# Patient Record
Sex: Female | Born: 1935
Health system: Southern US, Community
[De-identification: ages and names within clinical notes are randomized; demographics above are authoritative.]

## PROBLEM LIST (undated history)

## (undated) DIAGNOSIS — I700 Atherosclerosis of aorta: Secondary | ICD-10-CM

## (undated) DIAGNOSIS — D693 Immune thrombocytopenic purpura: Secondary | ICD-10-CM

## (undated) DIAGNOSIS — I739 Peripheral vascular disease, unspecified: Secondary | ICD-10-CM

## (undated) DIAGNOSIS — K760 Fatty (change of) liver, not elsewhere classified: Secondary | ICD-10-CM

## (undated) DIAGNOSIS — M949 Disorder of cartilage, unspecified: Secondary | ICD-10-CM

## (undated) DIAGNOSIS — K219 Gastro-esophageal reflux disease without esophagitis: Secondary | ICD-10-CM

## (undated) DIAGNOSIS — R7989 Other specified abnormal findings of blood chemistry: Secondary | ICD-10-CM

## (undated) DIAGNOSIS — E785 Hyperlipidemia, unspecified: Secondary | ICD-10-CM

## (undated) DIAGNOSIS — M75100 Unspecified rotator cuff tear or rupture of unspecified shoulder, not specified as traumatic: Secondary | ICD-10-CM

## (undated) DIAGNOSIS — K589 Irritable bowel syndrome without diarrhea: Secondary | ICD-10-CM

## (undated) DIAGNOSIS — I2510 Atherosclerotic heart disease of native coronary artery without angina pectoris: Secondary | ICD-10-CM

## (undated) DIAGNOSIS — IMO0002 Reserved for concepts with insufficient information to code with codable children: Secondary | ICD-10-CM

## (undated) DIAGNOSIS — K5790 Diverticulosis of intestine, part unspecified, without perforation or abscess without bleeding: Secondary | ICD-10-CM

## (undated) DIAGNOSIS — M722 Plantar fascial fibromatosis: Secondary | ICD-10-CM

## (undated) DIAGNOSIS — N83202 Unspecified ovarian cyst, left side: Secondary | ICD-10-CM

## (undated) DIAGNOSIS — M7660 Achilles tendinitis, unspecified leg: Secondary | ICD-10-CM

## (undated) DIAGNOSIS — Z8719 Personal history of other diseases of the digestive system: Secondary | ICD-10-CM

## (undated) DIAGNOSIS — R32 Unspecified urinary incontinence: Secondary | ICD-10-CM

## (undated) DIAGNOSIS — K56699 Other intestinal obstruction unspecified as to partial versus complete obstruction: Secondary | ICD-10-CM

## (undated) DIAGNOSIS — N318 Other neuromuscular dysfunction of bladder: Secondary | ICD-10-CM

## (undated) DIAGNOSIS — F419 Anxiety disorder, unspecified: Secondary | ICD-10-CM

## (undated) DIAGNOSIS — H9190 Unspecified hearing loss, unspecified ear: Secondary | ICD-10-CM

## (undated) DIAGNOSIS — K8020 Calculus of gallbladder without cholecystitis without obstruction: Secondary | ICD-10-CM

## (undated) DIAGNOSIS — M1710 Unilateral primary osteoarthritis, unspecified knee: Secondary | ICD-10-CM

## (undated) DIAGNOSIS — M899 Disorder of bone, unspecified: Secondary | ICD-10-CM

## (undated) DIAGNOSIS — R945 Abnormal results of liver function studies: Secondary | ICD-10-CM

## (undated) DIAGNOSIS — N8110 Cystocele, unspecified: Secondary | ICD-10-CM

## (undated) HISTORY — DX: Other specified abnormal findings of blood chemistry: R79.89

## (undated) HISTORY — DX: Plantar fascial fibromatosis: M72.2

## (undated) HISTORY — DX: Personal history of other diseases of the digestive system: Z87.19

## (undated) HISTORY — DX: Unspecified rotator cuff tear or rupture of unspecified shoulder, not specified as traumatic: M75.100

## (undated) HISTORY — DX: Unilateral primary osteoarthritis, unspecified knee: M17.10

## (undated) HISTORY — DX: Atherosclerosis of aorta: I70.0

## (undated) HISTORY — DX: Disorder of bone, unspecified: M94.9

## (undated) HISTORY — DX: Fatty (change of) liver, not elsewhere classified: K76.0

## (undated) HISTORY — DX: Abnormal results of liver function studies: R94.5

## (undated) HISTORY — DX: Atherosclerotic heart disease of native coronary artery without angina pectoris: I25.10

## (undated) HISTORY — DX: Unspecified ovarian cyst, left side: N83.202

## (undated) HISTORY — DX: Cystocele, unspecified: N81.10

## (undated) HISTORY — DX: Other neuromuscular dysfunction of bladder: N31.8

## (undated) HISTORY — DX: Gastro-esophageal reflux disease without esophagitis: K21.9

## (undated) HISTORY — DX: Peripheral vascular disease, unspecified: I73.9

## (undated) HISTORY — PX: KNEE ARTHROSCOPY: SUR90

## (undated) HISTORY — DX: Other intestinal obstruction unspecified as to partial versus complete obstruction: K56.699

## (undated) HISTORY — DX: Hyperlipidemia, unspecified: E78.5

## (undated) HISTORY — DX: Diverticulosis of intestine, part unspecified, without perforation or abscess without bleeding: K57.90

## (undated) HISTORY — DX: Reserved for concepts with insufficient information to code with codable children: IMO0002

## (undated) HISTORY — DX: Immune thrombocytopenic purpura: D69.3

## (undated) HISTORY — DX: Unspecified urinary incontinence: R32

## (undated) HISTORY — DX: Achilles tendinitis, unspecified leg: M76.60

## (undated) HISTORY — DX: Disorder of bone, unspecified: M89.9

## (undated) HISTORY — PX: ANGIOPLASTY: SHX39

## (undated) HISTORY — DX: Irritable bowel syndrome, unspecified: K58.9

## (undated) HISTORY — DX: Calculus of gallbladder without cholecystitis without obstruction: K80.20

## (undated) HISTORY — PX: CARDIAC CATHETERIZATION: SHX172

---
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
## 1960-05-07 HISTORY — PX: VARICOSE VEIN SURGERY: SHX832

## 1961-05-07 HISTORY — PX: ABDOMINAL HYSTERECTOMY: SHX81

## 2004-05-07 DIAGNOSIS — K56699 Other intestinal obstruction unspecified as to partial versus complete obstruction: Secondary | ICD-10-CM

## 2004-05-07 HISTORY — DX: Other intestinal obstruction unspecified as to partial versus complete obstruction: K56.699

## 2005-01-16 ENCOUNTER — Encounter: Payer: Self-pay | Admitting: Internal Medicine

## 2005-01-16 LAB — HM COLONOSCOPY

## 2005-05-07 ENCOUNTER — Encounter: Payer: Self-pay | Admitting: Internal Medicine

## 2005-10-05 HISTORY — PX: LAPAROSCOPIC SIGMOID COLECTOMY: SHX5928

## 2005-11-08 ENCOUNTER — Encounter: Payer: Self-pay | Admitting: Internal Medicine

## 2008-03-23 ENCOUNTER — Encounter: Payer: Self-pay | Admitting: Internal Medicine

## 2008-08-03 ENCOUNTER — Encounter: Payer: Self-pay | Admitting: Internal Medicine

## 2008-08-03 LAB — CONVERTED CEMR LAB
Cholesterol: 249 mg/dL
HDL: 66 mg/dL
LDL Cholesterol: 146 mg/dL
Triglyceride fasting, serum: 184 mg/dL

## 2009-02-16 ENCOUNTER — Encounter: Payer: Self-pay | Admitting: Internal Medicine

## 2009-02-16 LAB — CONVERTED CEMR LAB
BUN: 20 mg/dL
Calcium: 9.1 mg/dL
Chloride: 103 meq/L
Cholesterol: 247 mg/dL
Creatinine, Ser: 0.9 mg/dL
Glucose, Bld: 89 mg/dL
HDL: 65 mg/dL
LDL Cholesterol: 152 mg/dL
Potassium: 4.4 meq/L
Sodium: 140 meq/L
Total Protein: 6.9 g/dL
Triglyceride fasting, serum: 151 mg/dL

## 2009-08-05 ENCOUNTER — Encounter: Payer: Self-pay | Admitting: Internal Medicine

## 2009-08-05 LAB — CONVERTED CEMR LAB
Albumin: 3.7 g/dL
CO2: 32 meq/L
Calcium: 9.1 mg/dL
Glucose, Bld: 72 mg/dL
Potassium: 4.3 meq/L
Sodium: 140 meq/L

## 2009-08-12 ENCOUNTER — Encounter: Payer: Self-pay | Admitting: Internal Medicine

## 2009-09-18 ENCOUNTER — Encounter: Payer: Self-pay | Admitting: Internal Medicine

## 2009-10-05 HISTORY — PX: OTHER SURGICAL HISTORY: SHX169

## 2009-10-17 ENCOUNTER — Encounter: Payer: Self-pay | Admitting: Internal Medicine

## 2009-10-20 ENCOUNTER — Encounter: Payer: Self-pay | Admitting: Internal Medicine

## 2009-10-20 LAB — CONVERTED CEMR LAB
HCT: 39.4 %
HCT: 39.9 %
Hemoglobin: 12.9 g/dL
Lymphocytes, automated: 33.7 %
MCV: 89.5 fL
Monocytes Relative: 1.6 %
Neutrophils Relative %: 49.8 %
Neutrophils Relative %: 61.1 %
Platelets: 128 10*3/uL
Platelets: 165 10*3/uL
RBC: 4.13 M/uL

## 2009-11-03 ENCOUNTER — Encounter: Payer: Self-pay | Admitting: Internal Medicine

## 2009-11-15 ENCOUNTER — Encounter: Payer: Self-pay | Admitting: Internal Medicine

## 2009-11-15 LAB — CONVERTED CEMR LAB
HCT: 37 %
Hemoglobin: 12.4 g/dL
MCV: 89.7 fL
Monocytes Relative: 1.9 %
Platelets: 111 10*3/uL
WBC: 17.3 10*3/uL

## 2009-12-20 ENCOUNTER — Encounter: Payer: Self-pay | Admitting: Internal Medicine

## 2009-12-20 LAB — CONVERTED CEMR LAB
ALT: 30 units/L
AST: 19 units/L
Lymphocytes, automated: 30.8 %
MCV: 89.3 fL
Monocytes Relative: 1.1 %
Neutrophils Relative %: 59.1 %

## 2009-12-21 ENCOUNTER — Encounter: Payer: Self-pay | Admitting: Internal Medicine

## 2010-02-16 LAB — HM MAMMOGRAPHY

## 2010-02-20 ENCOUNTER — Encounter: Payer: Self-pay | Admitting: Internal Medicine

## 2010-02-20 LAB — CONVERTED CEMR LAB
AST: 21 units/L
Albumin: 3.7 g/dL
Alkaline Phosphatase: 97 units/L
CO2: 31 meq/L
Chloride: 103 meq/L
Cholesterol: 229 mg/dL
HDL: 56 mg/dL
Hemoglobin: 13.3 g/dL
LDL Cholesterol: 127 mg/dL
MCV: 87 fL
Monocytes Relative: 0.9 %
Platelets: 90 10*3/uL
Potassium: 3.8 meq/L
RBC: 4.63 M/uL
Sodium: 140 meq/L
TSH: 2.52 microintl units/mL
Triglyceride fasting, serum: 228 mg/dL

## 2010-02-22 ENCOUNTER — Encounter: Payer: Self-pay | Admitting: Internal Medicine

## 2010-06-27 ENCOUNTER — Encounter: Payer: Self-pay | Admitting: Internal Medicine

## 2010-06-27 ENCOUNTER — Ambulatory Visit (INDEPENDENT_AMBULATORY_CARE_PROVIDER_SITE_OTHER): Payer: Medicare HMO | Admitting: Internal Medicine

## 2010-06-27 ENCOUNTER — Other Ambulatory Visit: Payer: Self-pay | Admitting: Internal Medicine

## 2010-06-27 ENCOUNTER — Other Ambulatory Visit: Payer: Medicare Other

## 2010-06-27 DIAGNOSIS — D693 Immune thrombocytopenic purpura: Secondary | ICD-10-CM | POA: Insufficient documentation

## 2010-06-27 DIAGNOSIS — K219 Gastro-esophageal reflux disease without esophagitis: Secondary | ICD-10-CM | POA: Insufficient documentation

## 2010-06-27 DIAGNOSIS — M949 Disorder of cartilage, unspecified: Secondary | ICD-10-CM

## 2010-06-27 DIAGNOSIS — M1710 Unilateral primary osteoarthritis, unspecified knee: Secondary | ICD-10-CM | POA: Insufficient documentation

## 2010-06-27 DIAGNOSIS — R32 Unspecified urinary incontinence: Secondary | ICD-10-CM | POA: Insufficient documentation

## 2010-06-27 DIAGNOSIS — E785 Hyperlipidemia, unspecified: Secondary | ICD-10-CM

## 2010-06-27 DIAGNOSIS — I739 Peripheral vascular disease, unspecified: Secondary | ICD-10-CM

## 2010-06-27 DIAGNOSIS — M899 Disorder of bone, unspecified: Secondary | ICD-10-CM | POA: Insufficient documentation

## 2010-06-27 DIAGNOSIS — N318 Other neuromuscular dysfunction of bladder: Secondary | ICD-10-CM

## 2010-06-27 DIAGNOSIS — Z8719 Personal history of other diseases of the digestive system: Secondary | ICD-10-CM | POA: Insufficient documentation

## 2010-06-27 DIAGNOSIS — M129 Arthropathy, unspecified: Secondary | ICD-10-CM | POA: Insufficient documentation

## 2010-06-27 DIAGNOSIS — IMO0002 Reserved for concepts with insufficient information to code with codable children: Secondary | ICD-10-CM | POA: Insufficient documentation

## 2010-06-27 DIAGNOSIS — K589 Irritable bowel syndrome without diarrhea: Secondary | ICD-10-CM | POA: Insufficient documentation

## 2010-06-27 DIAGNOSIS — H409 Unspecified glaucoma: Secondary | ICD-10-CM | POA: Insufficient documentation

## 2010-06-27 LAB — CBC WITH DIFFERENTIAL/PLATELET
Basophils Relative: 0.5 % (ref 0.0–3.0)
Eosinophils Absolute: 0.3 10*3/uL (ref 0.0–0.7)
Eosinophils Relative: 1.7 % (ref 0.0–5.0)
Hemoglobin: 13.6 g/dL (ref 12.0–15.0)
Lymphocytes Relative: 28 % (ref 12.0–46.0)
MCHC: 33.7 g/dL (ref 30.0–36.0)
MCV: 86.9 fl (ref 78.0–100.0)
Neutro Abs: 9.2 10*3/uL — ABNORMAL HIGH (ref 1.4–7.7)
RBC: 4.64 Mil/uL (ref 3.87–5.11)
WBC: 15.4 10*3/uL — ABNORMAL HIGH (ref 4.5–10.5)

## 2010-07-04 NOTE — Letter (Signed)
Summary: [facility]  [facility]   Imported By: Lester Kevil 06/30/2010 08:15:56  _____________________________________________________________________  External Attachment:    Type:   Image     Comment:   External Document

## 2010-07-04 NOTE — Letter (Signed)
Summary: Bariatric Div/William Sibyl Parr MD  Bariatric Div/William Sibyl Parr MD   Imported By: Lester  06/30/2010 08:29:18  _____________________________________________________________________  External Attachment:    Type:   Image     Comment:   External Document

## 2010-07-04 NOTE — Assessment & Plan Note (Signed)
Summary: new pt/medicare/lb   Vital Signs:  Patient profile:   77 year old female Height:      63 inches (160.02 cm) Weight:      145.0 pounds (65.91 kg) BMI:     25.78 O2 Sat:      95 % on Room air Temp:     97.6 degrees F (36.44 degrees C) oral Pulse rate:   73 / minute BP sitting:   130 / 72  (left arm) Cuff size:   regular  Vitals Entered By: Orlan Leavens RMA (June 27, 2010 10:56 AM)  O2 Flow:  Room air CC: New patient Is Patient Diabetic? No Pain Assessment Patient in pain? no        Primary Care Provider:  Newt Lukes MD  CC:  New patient.  History of Present Illness: new pt to me and our practice, here to est care - prev in [place], [place] prior records brought with pt today to review  chronic ITP - dx 1950s s/p splenectomy- on low dose pred to control same - usual count 80-100K, occ flares with illness - remote hosp to tx acute exac, no severe flares in recent years on current tx - no brusing or bleeding symptoms   dyslipidemia - rx simva fall 2011 poorly tol due to myalgias - declines other stain trial tx - follows low fat diet and exercises for weight control (walking)  GERD with HH - chronic hx, uses otc med as needed for same - ?recs for any med - no abd pain, bowel change or n/v/anorexia symptoms   OA - R knee, R>L hip and low back ddd - occ steroid injections with ortho for acute flares - last >6 mo ago in hip - no swelling or need for daily pain med relief - uses glucosamine  c/o overactive bladder and freq "urge" - ?if any med tx available (has glaucoma)  Preventive Screening-Counseling & Management  Alcohol-Tobacco     Alcohol drinks/day: <1     Alcohol Counseling: not indicated; use of alcohol is not excessive or problematic     Smoking Status: never     Tobacco Counseling: not indicated; no tobacco use  Caffeine-Diet-Exercise     Diet Counseling: not indicated; diet is assessed to be healthy     Does Patient Exercise: yes     Exercise  Counseling: not indicated; exercise is adequate     Depression Counseling: not indicated; screening negative for depression  Safety-Violence-Falls     Seat Belt Counseling: not indicated; patient wears seat belts     Helmet Counseling: not applicable     Firearm Counseling: not indicated; uses recommended firearm safety measures     Violence Counseling: not indicated; no violence risk noted     Fall Risk Counseling: not indicated; no significant falls noted  Clinical Review Panels:  Prevention   Last Mammogram:  Done @ [place] radiologist in [place] No specific mammographic evidence of malignancy.   (02/16/2010)   Last Pap Smear:  Interpretation Result:Negative for intraepithelial Lesion or Malignancy.    (05/07/2005)  Immunizations   Last Zoster Vaccine:  Zostavax (04/07/2007)  Lipid Management   Cholesterol:  229 (02/20/2010)   LDL (bad choesterol):  127 (02/20/2010)   HDL (good cholesterol):  56 (02/20/2010)   Triglycerides:  228 (02/20/2010)  CBC   WBC:  9.2 (02/20/2010)   RBC:  4.63 (02/20/2010)   Hgb:  13.3 (02/20/2010)   Hct:  40.3 (02/20/2010)   Platelets:  90 (02/20/2010)   MCV  87.0 (02/20/2010)   PMN:  46.7 (02/20/2010)   Monos:  0.9 (02/20/2010)  Complete Metabolic Panel   Glucose:  83 (02/20/2010)   Sodium:  140 (02/20/2010)   Potassium:  3.8 (02/20/2010)   Chloride:  103 (02/20/2010)   CO2:  31 (02/20/2010)   BUN:  15 (02/20/2010)   Creatinine:  0.8 (02/20/2010)   Albumin:  3.7 (02/20/2010)   Total Protein:  7.0 (02/20/2010)   Calcium:  8.7 (02/20/2010)   Total Bili:  0.4 (02/20/2010)   Alk Phos:  97 (02/20/2010)   SGPT (ALT):  31 (02/20/2010)   SGOT (AST):  21 (02/20/2010)   -  Date:  02/20/2010    WBC: 9.2    HGB: 13.3    HCT: 40.3    RBC: 4.63    PLT: 90    MCV: 87.0    Neutrophil: 46.7    Lymphs: 43.1    Monos: 0.9    BG Random: 83    BUN: 15    Creatinine: 0.8    Sodium: 140    Potassium: 3.8    Chloride: 103    CO2  Total: 31    SGOT (AST): 21    SGPT (ALT): 31    T. Bilirubin: 0.4    Alk Phos: 97    Calcium: 8.7    Total Protein: 7.0    Albumin: 3.7    Cholesterol: 229    LDL: 127    HDL: 56    Triglycerides: 161    TSH: 2.52  Date:  12/20/2009    WBC: 11.3    HGB: 13.0    HCT: 37.9    RBC: 4.24    PLT: 102    MCV: 89.3    Neutrophil: 59.1    Lymphs: 30.8    Monos: 1.1    SGOT (AST): 19    SGPT (ALT): 30    Cholesterol: 148    LDL: 53    HDL: 60    Triglycerides: 096    TSH: 1.81  Date:  11/15/2009    WBC: 17.3    HGB: 12.4    HCT: 37.0    RBC: 4.13    PLT: 111    MCV: 89.7    Neutrophil: 63.2    Lymphs: 25.9    Monos: 1.9  Date:  10/20/2009    WBC: 10.9    WBC: 19.1    HGB: 12.9    HGB: 13.6    HCT: 39.4    HCT: 39.9    RBC: 4.40    RBC: 4.13    PLT: 128    PLT: 165    MCV: 89.5    MCV: 88.9    Neutrophil: 49.8    Neutrophil: 61.1    Lymphs: 39.6    Lymphs: 33.7    Monos: 1.6    Monos: 1.0  Date:  08/05/2009    BG Random: 72    BUN: 21    Creatinine: 0.8    Sodium: 140    Potassium: 4.3    Chloride: 104    CO2 Total: 32    Calcium: 9.1    Albumin: 3.7    Cholesterol: 230    LDL: 124    HDL: 62    Triglycerides: 045    TSH: 1.98  Date:  02/16/2009    BG Random: 89    BUN: 20    Creatinine: 0.9    Sodium: 140  Potassium: 4.4    Chloride: 103    CO2 Total: 31    SGOT (AST): 19    SGPT (ALT): 27    T. Bilirubin: 0.4    Alk Phos: 89    Calcium: 9.1    Total Protein: 6.9    Cholesterol: 247    LDL: 152    HDL: 65    Triglycerides: 045    TSH: 2.88  Date:  08/03/2008    Cholesterol: 249    LDL: 146    HDL: 66    Triglycerides: 409  Current Medications (verified): 1)  Prednisone 2.5 Mg Tabs (Prednisone) .... Take 1 By Mouth Once Daily 2)  Premarin 0.3 Mg Tabs (Estrogens Conjugated) .... Take 1 By Mouth Once Daily 3)  Amoxicillin 500 Mg Caps (Amoxicillin) .... Use For Dental Procedures 4)  Lumigan 0.01 % Soln (Bimatoprost)  .... Use 1 Drop Each Eye Once Daily 5)  Citracal Calcium+d 600-40-500 Mg-Mg-Unit Xr24h-Tab (Calcium-Magnesium-Vitamin D) .... Take 2 By Mouth Once Daily 6)  Glucosamine 500 Mg Caps (Glucosamine Sulfate) .... Take 1 By Mouth Once Daily 7)  Multivitamins  Tabs (Multiple Vitamin) .... Take 1 By Mouth Once Daily 8)  Magnesium 500 Mg Tabs (Magnesium) .... Take 1 By Mouth Once Daily 9)  Vitamin C 500 Mg Tabs (Ascorbic Acid) .... Take 1 By Mouth Once Daily 10)  Stool Softener 100 Mg Caps (Docusate Sodium) .... Take As Needed 11)  Flaxseed Oil 1000 Mg Caps (Flaxseed (Linseed)) .... Take 1 By Mouth Once Daily 12)  Appearex 2.5 Mg Tabs (Biotin) .... Take 1 By Mouth Once Daily  Allergies (verified): No Known Drug Allergies  Past History:  Past Medical History: ITP, chronic - 80-100 baseline- pred maint, s/p splenectomy Diverticulitis, hx GERD with HH IBS PVD - L pop PTA 10/2009 dyslipidemia - declines statin due to myalgias (prev simva) Urinary incontinence glaucoma/cataracts  MD roster: optho - [facility] ([facility])  Past Surgical History: Splenectomy (8119) Hysterectomy (1963) L pop PTA 10/2009 Colectomy, sigmoid (10/2005)  Family History: Family History of Arthritis (parent) mini stroke (parent)  dad exired age 62 - stomach ca mom expired age 64 - TIA, MI/CAD  Family History Hypertension (daughter) Family History High cholesterol (daughter)  Social History: Never Smoked rarae alcohol use retired - Futures trader married, lives with spouse -  moved to [place] from [place], [place] 05/2010 to be close to 2 kids  Smoking Status:  never Does Patient Exercise:  yes  Review of Systems       see HPI above. I have reviewed all other systems and they were negative.   Physical Exam  General:  spry, alert, well-developed, well-nourished, and cooperative to examination.    Head:  Normocephalic and atraumatic without obvious abnormalities. No apparent alopecia or balding. Eyes:   vision grossly intact; pupils equal, round and reactive to light.  conjunctiva and lids normal.    Ears:  discrete hearing aides B, R ear normal and no external deformities.   Mouth:  teeth and gums in good repair; mucous membranes moist, without lesions or ulcers. oropharynx clear without exudate, no erythema.  Neck:  supple, full ROM, no masses, no thyromegaly; no thyroid nodules or tenderness. no JVD or carotid bruits.   Lungs:  normal respiratory effort, no intercostal retractions or use of accessory muscles; normal breath sounds bilaterally - no crackles and no wheezes.    Heart:  normal rate, regular rhythm, no murmur, and no rub. BLE without edema. Abdomen:  soft,  non-tender, normal bowel sounds, no distention; no masses and no appreciable hepatomegaly or splenomegaly.   Genitalia:  defer Msk:  No deformity or scoliosis noted of thoracic or lumbar spine.   Neurologic:  alert & oriented X3 and cranial nerves II-XII symetrically intact.  strength normal in all extremities, sensation intact to light touch, and gait normal. speech fluent without dysarthria or aphasia; follows commands with good comprehension.  Skin:  no rashes, vesicles, ulcers, or erythema. No nodules or irregularity to palpation.  Psych:  Oriented X3, memory intact for recent and remote, normally interactive, good eye contact, not anxious appearing, not depressed appearing, and not agitated.      Impression & Recommendations:  Problem # 1:  IMMUNE THROMBOCYTOPENIC PURPURA (ICD-287.31) chronic - dx 1950s = s/p splenectomy remote for same and maintained on chronic low dose pred no recent flares - baseline count 80-100 on pred tx - does not follow with heme routinely - cont same and check labs q74mo and as needed  Orders: TLB-CBC Platelet - w/Differential (85025-CBCD)  Problem # 2:  DYSLIPIDEMIA (ICD-272.4) intol of statins (simva trail fall 2011) and declines repeat trial of alt statin risk with known PAD reviewed - pt  understands and declines statin tx -  cont fish oil and diet/exercise/weight control  Problem # 3:  OVERACTIVE BLADDER (ICD-596.51) ?med trial - hold on same given dx glaucoma -  will consider if other med tx available vs uro f/u for other testing and tx  Problem # 4:  GERD (ICD-530.81) rec two times a day max dose med (pt purchases same otc) chronic hx same with IBS-C send for prior records  Her updated medication list for this problem includes:    Zantac 150 Mg Tabs (Ranitidine hcl) .[name]... 1 by mouth two times a day  Problem # 5:  UNSPECIFIED PERIPHERAL VASCULAR DISEASE (ICD-443.9) s/p PTA to L pop  - f/u ABI good and no current claudication - cont expect obs as intol of other med tx for same Time spent with patient 45 minutes, more than 50% of this time was spent counseling patient on ITP and OAB and problems concerning the need for med trial and est local care - name given for ortho (can arrange referral as needed), advised on otc meds and ?need uro eval once optho issues stable - also review available records (copy into emr) and send for remianing records  Complete Medication List: 1)  Prednisone 2.5 Mg Tabs (Prednisone) .... Take 1 by mouth once daily 2)  Premarin 0.3 Mg Tabs (Estrogens conjugated) .... Take 1 by mouth once daily 3)  Amoxicillin 500 Mg Caps (Amoxicillin) .... Use for dental procedures 4)  Lumigan 0.01 % Soln (Bimatoprost) .... Use 1 drop each eye once daily 5)  Citracal Calcium+d 600-40-500 Mg-mg-unit Xr24h-tab (Calcium-magnesium-vitamin d) .... Take 2 by mouth once daily 6)  Glucosamine 500 Mg Caps (Glucosamine sulfate) .... Take 1 by mouth once daily 7)  Multivitamins Tabs (Multiple vitamin) .... Take 1 by mouth once daily 8)  Magnesium 500 Mg Tabs (Magnesium) .... Take 1 by mouth once daily 9)  Vitamin C 500 Mg Tabs (Ascorbic acid) .... Take 1 by mouth once daily 10)  Stool Softener 100 Mg Caps (Docusate sodium) .... Take as needed 11)  Flaxseed Oil 1000 Mg  Caps (Flaxseed (linseed)) .... Take 1 by mouth once daily 12)  Appearex 2.5 Mg Tabs (Biotin) .... Take 1 by mouth once daily 13)  Zantac 150 Mg Tabs (Ranitidine hcl) .[name].. 1 by  mouth two times a day  Patient Instructions: 1)  it was good to see you today. 2)  medications and history reviewed today 3)  will send for additional records from your prior provider as discussed - release of info signed today 4)  use zantac 150mg  two times a day for reflux symptoms  5)  will consider if any med tx available for bladder despite glaucoma diagnosis 6)  test(s) ordered today - your results will be called to you after review in 48-72 hours from the time of test completion; if any changes need to be made or there are abnormal results, you will be contacted directly.  7)  call dr. Charlett Blake at [facility] for orthopedic evaluation as needed - we can make referral if needed 8)  Please schedule a follow-up appointment in 4 months to monitor platelets, call sooner if problems.  Prescriptions: PREMARIN 0.3 MG TABS (ESTROGENS CONJUGATED) take 1 by mouth once daily  #90 x 1   Entered by:   Brenton Grills CMA (AAMA)   Authorized by:   Newt Lukes MD   Signed by:   Brenton Grills CMA (AAMA) on 06/27/2010   Method used:   Print then Give to Patient   RxID:   [id number] PREDNISONE 2.5 MG TABS (PREDNISONE) take 1 by mouth once daily  #90 x 1   Entered by:   Brenton Grills CMA (AAMA)   Authorized by:   Newt Lukes MD   Signed by:   Brenton Grills CMA (AAMA) on 06/27/2010   Method used:   Print then Give to Patient   RxID:   [id number]    Orders Added: 1)  TLB-CBC Platelet - w/Differential [85025-CBCD] 2)  New Patient Level IV [95284]   Immunization History:  Zostavax History:    Zostavax # 1:  zostavax (04/07/2007)   Immunization History:  Zostavax History:    Zostavax # 1:  Zostavax (04/07/2007)   Mammogram  Procedure date:  05/07/2009  Findings:      No specific  mammographic evidence of malignancy.    Pap Smear  Procedure date:  05/07/2005  Findings:      Interpretation Result:Negative for intraepithelial Lesion or Malignancy.     Mammogram  Procedure date:  02/16/2010  Findings:      Done @ [place] radiologist in [place] No specific mammographic evidence of malignancy.

## 2010-07-04 NOTE — Letter (Signed)
Summary: Pennelope Bracken MD  Pennelope Bracken MD   Imported By: Lester [place] 06/30/2010 08:23:43  _____________________________________________________________________  External Attachment:    Type:   Image     Comment:   External Document

## 2010-07-04 NOTE — Op Note (Signed)
Summary: Claudication/William Frederico Hamman MD  Claudication/William Frederico Hamman MD   Imported By: Lester [place] 06/30/2010 08:19:57  _____________________________________________________________________  External Attachment:    Type:   Image     Comment:   External Document

## 2010-07-18 NOTE — Letter (Signed)
Summary: [facility]  [facility]   Imported By: Sherian Rein 07/13/2010 09:23:24  _____________________________________________________________________  External Attachment:    Type:   Image     Comment:   External Document

## 2010-07-18 NOTE — Letter (Signed)
Summary: [facility] Internal Medicine  [facility] Internal Medicine   Imported By: Sherian Rein 07/13/2010 09:25:04  _____________________________________________________________________  External Attachment:    Type:   Image     Comment:   External Document

## 2010-07-18 NOTE — Procedures (Signed)
Summary: Colonoscopy/[facility]  Colonoscopy/[facility]   Imported By: Sherian Rein 07/13/2010 09:17:29  _____________________________________________________________________  External Attachment:    Type:   Image     Comment:   External Document

## 2010-09-23 ENCOUNTER — Encounter: Payer: Self-pay | Admitting: Cardiology

## 2010-10-06 HISTORY — PX: CATARACT EXTRACTION, BILATERAL: SHX1313

## 2010-10-26 ENCOUNTER — Other Ambulatory Visit (INDEPENDENT_AMBULATORY_CARE_PROVIDER_SITE_OTHER): Payer: Medicare HMO

## 2010-10-26 ENCOUNTER — Ambulatory Visit (INDEPENDENT_AMBULATORY_CARE_PROVIDER_SITE_OTHER): Payer: Medicare HMO | Admitting: Internal Medicine

## 2010-10-26 ENCOUNTER — Encounter: Payer: Self-pay | Admitting: Internal Medicine

## 2010-10-26 VITALS — BP 130/80 | HR 76 | Temp 97.6°F | Ht 63.0 in | Wt 142.6 lb

## 2010-10-26 DIAGNOSIS — M7070 Other bursitis of hip, unspecified hip: Secondary | ICD-10-CM | POA: Insufficient documentation

## 2010-10-26 DIAGNOSIS — D693 Immune thrombocytopenic purpura: Secondary | ICD-10-CM

## 2010-10-26 DIAGNOSIS — E785 Hyperlipidemia, unspecified: Secondary | ICD-10-CM

## 2010-10-26 DIAGNOSIS — I739 Peripheral vascular disease, unspecified: Secondary | ICD-10-CM

## 2010-10-26 DIAGNOSIS — M76899 Other specified enthesopathies of unspecified lower limb, excluding foot: Secondary | ICD-10-CM

## 2010-10-26 LAB — CBC WITH DIFFERENTIAL/PLATELET
Basophils Absolute: 0.1 10*3/uL (ref 0.0–0.1)
Eosinophils Absolute: 0.2 10*3/uL (ref 0.0–0.7)
HCT: 41.6 % (ref 36.0–46.0)
Hemoglobin: 14.1 g/dL (ref 12.0–15.0)
Lymphs Abs: 4.6 10*3/uL — ABNORMAL HIGH (ref 0.7–4.0)
MCHC: 34 g/dL (ref 30.0–36.0)
MCV: 86.9 fl (ref 78.0–100.0)
Neutro Abs: 5.6 10*3/uL (ref 1.4–7.7)
RDW: 13.5 % (ref 11.5–14.6)

## 2010-10-26 MED ORDER — MELATONIN 1 MG PO TABS: 1.0000 mg | ORAL_TABLET | Freq: Every evening | ORAL | Status: DC | PRN

## 2010-10-26 NOTE — Assessment & Plan Note (Signed)
Hx same - relief following steroid injections prior to move to [place] Pt requests est with ortho for same - referral done

## 2010-10-26 NOTE — Assessment & Plan Note (Signed)
S/p splenectomy in 1950s - usually 80-100k range Maintained on chronic low dose pred for same - Recheck CBC now to monitor

## 2010-10-26 NOTE — Assessment & Plan Note (Signed)
Declines statin due to myalgias -  prev on simva following PVD intervention 2011 (L pop PTA)

## 2010-10-26 NOTE — Patient Instructions (Addendum)
It was good to see you today. We have reviewed your prior records including labs and tests today Ok to try melatonin at bedtime we'll make referral to vascular specialist and to orthopedics. Our office will contact you regarding appointment(s) once made. Please schedule followup in Nov for your physical, call sooner if problems.

## 2010-10-26 NOTE — Assessment & Plan Note (Signed)
L pop PTA/stent 10/2009 in [place], [facility] mgmt - no vasc symptoms but will refer to vasc for follow up as requested

## 2010-10-26 NOTE — Progress Notes (Signed)
Subjective:    Patient ID: Tamara Robertson, female    DOB: 17-Mar-1930, 77 y.o.   MRN: [id number]  HPI Here for follow up - reviewed chronic medical issues:  chronic ITP - dx 1950s s/p splenectomy- on low dose pred to control same - usual count 80-100K, occ flares with illness - remote hosp to tx acute exac, no severe flares in recent years on current tx - no brusing or bleeding symptoms   dyslipidemia - rx simva fall 2011 poorly tol due to myalgias - declines other statin trial tx - follows low fat diet and exercises for weight control (walking)   GERD with HH - chronic hx, uses otc med as needed for same - no abd pain, bowel change or n/v/anorexia symptoms   OA - R knee, R>L hip and low back ddd - occ steroid injections for acute flares - no swelling or need for daily pain med relief - uses glucosamine - prev followed with ortho but not re-estab since move to [place]  PVD s/p stent L - no claudication or leg pain but ?need vasc follow up locally    Past Medical History  Diagnosis Date  . OSTEOARTHRITIS, KNEE, RIGHT   . URINARY INCONTINENCE   . Irritable bowel syndrome   . DIVERTICULITIS, HX OF   . OSTEOPENIA   . Immune thrombocytopenic purpura     chronic 80-100K  . UNSPECIFIED PERIPHERAL VASCULAR DISEASE   . OVERACTIVE BLADDER   . DYSLIPIDEMIA   . Glaucoma   . GERD      Review of Systems  Respiratory: Negative for shortness of breath.   Cardiovascular: Negative for leg swelling.  Musculoskeletal: Negative for back pain and gait problem.  Hematological: Does not bruise/bleed easily.       Objective:   Physical Exam BP 130/80  Pulse 76  Temp(Src) 97.6 F (36.4 C) (Oral)  Ht 5\' 3"  (1.6 m)  Wt 142 lb 9.6 oz (64.683 kg)  BMI 25.26 kg/m2  SpO2 96% Physical Exam  Constitutional: She is oriented to person, place, and time. She appears well-developed and well-nourished. No distress. Neck: Normal range of motion. Neck supple. No JVD present. No thyromegaly present.    Cardiovascular: Normal rate, regular rhythm and normal heart sounds.  No murmur heard. No BLE edema. Pulmonary/Chest: Effort normal and breath sounds normal. No respiratory distress. She has no wheezes.  Musculoskeletal: Tender to palpation over greater troch bursa bilaterally. No groin pain. Normal range of motion hips, knees. No gross deformities Neurological: She is alert and oriented to person, place, and time. No cranial nerve deficit. Coordination normal.  Skin: Skin is warm and dry. No rash noted. No erythema.  Psychiatric: She has a normal mood and affect. Her behavior is normal. Judgment and thought content normal.   Lab Results  Component Value Date   WBC 15.4* 06/27/2010   HGB 13.6 06/27/2010   HCT 40.3 06/27/2010   PLT 129.0* 06/27/2010   CHOL 229 02/20/2010   HDL 56 02/20/2010   ALT 31 02/20/2010   AST 21 02/20/2010   NA 140 02/20/2010   K 3.8 02/20/2010   CL 103 02/20/2010   CREATININE 0.8 02/20/2010   BUN 15 02/20/2010   CO2 31 02/20/2010   TSH 2.52 02/20/2010        Assessment & Plan:  See problem list. Medications and labs reviewed today.

## 2010-11-16 ENCOUNTER — Ambulatory Visit: Payer: Medicare Other | Admitting: Cardiology

## 2010-11-23 ENCOUNTER — Encounter: Payer: Self-pay | Admitting: Cardiology

## 2010-11-23 ENCOUNTER — Ambulatory Visit (INDEPENDENT_AMBULATORY_CARE_PROVIDER_SITE_OTHER): Payer: Medicare HMO | Admitting: Cardiology

## 2010-11-23 DIAGNOSIS — E785 Hyperlipidemia, unspecified: Secondary | ICD-10-CM

## 2010-11-23 DIAGNOSIS — I739 Peripheral vascular disease, unspecified: Secondary | ICD-10-CM

## 2010-11-23 MED ORDER — PRAVASTATIN SODIUM 20 MG PO TABS: 20.0000 mg | ORAL_TABLET | Freq: Every evening | ORAL | Status: DC

## 2010-11-23 NOTE — Assessment & Plan Note (Addendum)
She did not tolerate simvastatin in the past.  Her last LDL was 127.  I have convinced her to try pravastatin 20 mg daily.  I will follow this fall before she sees Dr. Felicity Coyer. I will take the liberty of adding routine labs that Dr. Felicity Coyer might want for her routine follow up at that same time so that the patient only has one lab draw.

## 2010-11-23 NOTE — Patient Instructions (Addendum)
You are being scheduled for a treadmill.  Please follow the instructions given.  You are being started on Pravastatin 20 mg once a day.  Continue all other medications as listed.  You will need fasting lab work in October (lipid, liver, bmp, cbc,TSH)

## 2010-11-23 NOTE — Assessment & Plan Note (Signed)
The patient needs aggressive risk reduction. I will bring the patient back for a POET (Plain Old Exercise Test). This will allow me to screen for obstructive coronary disease, risk stratify and very importantly provide a prescription for exercise.  This is indicated as she wants to exercise and has not been screened in several years.Tamara Robertson

## 2010-11-23 NOTE — Progress Notes (Signed)
HPI The patient presents as a new patient. She has a history of peripheral vascular disease. She reports no other cardiac history. She had a previous PTA of the left popliteal obstruction. She was having claudication which is relieved. She reports negative carotid Dopplers in the past. She reports a negative treadmill test will be about 10 years ago. She walks for exercise. She denies any chest pressure, neck or arm discomfort. She denies any palpitations, presyncope or syncope. She has no shortness of breath, PND or orthopnea.  Allergies  Allergen Reactions  . Aspirin     Current Outpatient Prescriptions  Medication Sig Dispense Refill  . amoxicillin (AMOXIL) 500 MG capsule Take 500 mg by mouth as needed. Use for dental procedures       . bimatoprost (LUMIGAN) 0.03 % ophthalmic solution Place 1 drop into both eyes daily.        . Biotin (APPEAREX) 2.5 MG TABS Take by mouth daily.        . Calcium-Magnesium-Vitamin D (CITRACAL CALCIUM+D) 600-40-500 MG-MG-UNIT TB24 Take by mouth daily.        [name] docusate sodium (COLACE) 100 MG capsule Take 100 mg by mouth as needed.        [name] estrogens, conjugated, (PREMARIN) 0.3 MG tablet Take 0.3 mg by mouth daily. Take daily for 21 days then do not take for 7 days.       . Flaxseed, Linseed, (FLAXSEED OIL) 1000 MG CAPS Take by mouth daily.        . Glucosamine 500 MG TABS Take by mouth daily.        . Magnesium 500 MG TABS Take by mouth daily.        . Multiple Vitamin (MULTIVITAMIN) tablet Take 1 tablet by mouth daily.        . predniSONE (DELTASONE) 2.5 MG tablet Take 2.5 mg by mouth every other day.       . ranitidine (ZANTAC) 150 MG tablet Take 150 mg by mouth 2 (two) times daily.        . vitamin C (ASCORBIC ACID) 500 MG tablet Take 500 mg by mouth daily.          Past Medical History  Diagnosis Date  . OSTEOARTHRITIS, KNEE, RIGHT   . URINARY INCONTINENCE   . Irritable bowel syndrome   . DIVERTICULITIS, HX OF   . OSTEOPENIA   . Immune  thrombocytopenic purpura     chronic 80-100K  . UNSPECIFIED PERIPHERAL VASCULAR DISEASE   . OVERACTIVE BLADDER   . DYSLIPIDEMIA   . Glaucoma   . GERD     Past Surgical History  Procedure Date  . Splenectomy 1954  . Abdominal hysterectomy 1963  . L pop pta 10/2009  . Colectomy, simoid 10/2005  . Cataract extraction, bilateral 10/2010    Family History  Problem Relation Age of Onset  . Coronary artery disease Mother   . Heart attack Mother   . Hyperlipidemia Mother   . Hypertension Mother   . Stomach cancer Father   . Hypertension Daughter   . Hyperlipidemia Daughter     History   Social History  . Marital Status: Married    Spouse Name: N/A    Number of Children: N/A  . Years of Education: N/A   Occupational History  . Not on file.   Social History Main Topics  . Smoking status: Never Smoker   . Smokeless tobacco: Not on file   Comment: Married, lives with spouse. retired Education officer, museum.  Moved to [place] from [place] 05/2010 to be close to kids  . Alcohol Use: Yes     rarely  . Drug Use: No  . Sexually Active:    Other Topics Concern  . Not on file   Social History Narrative  . No narrative on file    ROS:  Positive for reflux, constipation, back pain.  Otherwise as stated in the HPI and negative for all other systems.   PHYSICAL EXAM BP 142/82  Pulse 72  Ht 5\' 3"  (1.6 m)  Wt 143 lb (64.864 kg)  BMI 25.33 kg/m2 GENERAL:  Well appearing HEENT:  Pupils equal round and reactive, fundi not visualized, oral mucosa unremarkable NECK:  No jugular venous distention, waveform within normal limits, carotid upstroke brisk and symmetric, no bruits, no thyromegaly LYMPHATICS:  No cervical, inguinal adenopathy LUNGS:  Clear to auscultation bilaterally BACK:  No CVA tenderness CHEST:  Unremarkable HEART:  PMI not displaced or sustained,S1 and S2 within normal limits, no S3, no S4, no clicks, no rubs, no murmurs ABD:  Flat, positive bowel sounds normal in  frequency in pitch, no bruits, no rebound, no guarding, no midline pulsatile mass, no hepatomegaly, no splenomegaly EXT:  2 plus pulses throughout, no edema, no cyanosis no clubbing SKIN:  No rashes no nodules NEURO:  Cranial nerves II through XII grossly intact, motor grossly intact throughout PSYCH:  Cognitively intact, oriented to person place and time   EKG:  Sinus rhythm, rate 72, axis within normal limits, and intervals within normal limits, no acute ST-T wave changes  ASSESSMENT AND PLAN

## 2010-12-15 ENCOUNTER — Encounter: Payer: Self-pay | Admitting: Cardiology

## 2011-01-11 ENCOUNTER — Ambulatory Visit (INDEPENDENT_AMBULATORY_CARE_PROVIDER_SITE_OTHER): Payer: Medicare HMO | Admitting: Cardiology

## 2011-01-11 DIAGNOSIS — I739 Peripheral vascular disease, unspecified: Secondary | ICD-10-CM

## 2011-01-11 NOTE — Progress Notes (Signed)
Exercise Treadmill Test  Pre-Exercise Testing Evaluation Rhythm: normal sinus  Rate: 74   PR:  .15 QRS:  .07  QT:  .39 QTc: .43     Test  Exercise Tolerance Test Ordering MD: Angelina Sheriff, MD  Interpreting MD:  Angelina Sheriff, MD  Unique Test No: 1  Treadmill:  1  Indication for ETT:   Contraindication to ETT: No   Stress Modality: exercise - treadmill  Cardiac Imaging Performed: non   Protocol: standard Bruce - maximal  Max BP:  190/137  Max MPHR (bpm):  141 85% MPR (bpm):  120  MPHR obtained (bpm):  155 % MPHR obtained:  105  Reached 85% MPHR (min:sec):  1:04 Total Exercise Time (min-sec): 5:00  Workload in METS:  7.0 Borg Scale: 16  Reason ETT Terminated:  desired heart rate attained    ST Segment Analysis At Rest: normal ST segments - no evidence of significant ST depression With Exercise: no evidence of significant ST depression  Other Information Arrhythmia:  Yes Angina during ETT:  absent (0) Quality of ETT:  diagnostic  ETT Interpretation:  normal - no evidence of ischemia by ST analysis  Comments: The patient had an moderate exercise tolerance.  There was no chest pain.  There was an appropriate level of dyspnea.  There were no significant arrhythmias, a normal heart rate response.  She had an increased BP response only at the highest level of exerction.  There were no ischemic ST T wave changes and a normal heart rate recovery.  She did have ventricular couplets.  Recommendations: Negative adequate ETT.  No further testing is indicated.  Based on the above I gave the patient a prescription for exercise.  She would avoid the higher levels of exertion at which her BP went up.  She needs only to simulate Stage 1 of a Bruce protocol or less with her daily walking.

## 2011-01-19 ENCOUNTER — Telehealth: Payer: Self-pay | Admitting: Cardiology

## 2011-01-19 NOTE — Telephone Encounter (Signed)
Per pt - has been having some muscle cramps in her thigh area, feels very tired and has noticed a few petechiae (has know history of low Plt count per pt).  Pt has orders for CBC, lipid, liver ,tsh etc. Already ordered and will come into the office on Monday or Tuesday to have those drawn.  She will hold her pravastatin thru the weekend until we can retrieve the lab results.  She is also complaining of some burning with urination however states that is not much.  She will increase her water intake and intake of cranberry juice thru out the weekend to see if this improves.

## 2011-01-19 NOTE — Telephone Encounter (Signed)
Pt has question re medication, pravastatin

## 2011-01-23 ENCOUNTER — Ambulatory Visit: Payer: Medicare HMO

## 2011-01-23 ENCOUNTER — Other Ambulatory Visit: Payer: Self-pay | Admitting: *Deleted

## 2011-01-23 DIAGNOSIS — E785 Hyperlipidemia, unspecified: Secondary | ICD-10-CM

## 2011-01-23 DIAGNOSIS — I739 Peripheral vascular disease, unspecified: Secondary | ICD-10-CM

## 2011-01-23 LAB — CBC WITH DIFFERENTIAL/PLATELET
Basophils Absolute: 0 10*3/uL (ref 0.0–0.1)
Eosinophils Absolute: 0.3 10*3/uL (ref 0.0–0.7)
Eosinophils Relative: 2.8 % (ref 0.0–5.0)
MCV: 88.4 fl (ref 78.0–100.0)
Monocytes Absolute: 1.1 10*3/uL — ABNORMAL HIGH (ref 0.1–1.0)
Neutrophils Relative %: 49.1 % (ref 43.0–77.0)
Platelets: 149 10*3/uL — ABNORMAL LOW (ref 150.0–400.0)
RDW: 13.9 % (ref 11.5–14.6)
WBC: 9.7 10*3/uL (ref 4.5–10.5)

## 2011-01-23 LAB — BASIC METABOLIC PANEL
BUN: 18 mg/dL (ref 6–23)
CO2: 30 mEq/L (ref 19–32)
Calcium: 9.2 mg/dL (ref 8.4–10.5)
Chloride: 103 mEq/L (ref 96–112)
Creatinine, Ser: 0.9 mg/dL (ref 0.4–1.2)
GFR: 65.82 mL/min (ref >60.00)
Glucose, Bld: 70 mg/dL (ref 70–99)
Potassium: 4.1 mEq/L (ref 3.5–5.1)
Sodium: 142 mEq/L (ref 135–145)

## 2011-01-23 LAB — HEPATIC FUNCTION PANEL
ALT: 15 U/L (ref 0–35)
AST: 21 U/L (ref 0–37)
Bilirubin, Direct: 0 mg/dL (ref 0.0–0.3)
Total Bilirubin: 0.7 mg/dL (ref 0.3–1.2)
Total Protein: 7.1 g/dL (ref 6.0–8.3)

## 2011-01-23 LAB — LDL CHOLESTEROL, DIRECT: Direct LDL: 138.1 mg/dL

## 2011-01-23 LAB — LIPID PANEL: Total CHOL/HDL Ratio: 4

## 2011-01-27 ENCOUNTER — Encounter (HOSPITAL_BASED_OUTPATIENT_CLINIC_OR_DEPARTMENT_OTHER): Payer: Self-pay | Admitting: Emergency Medicine

## 2011-01-27 ENCOUNTER — Emergency Department (HOSPITAL_BASED_OUTPATIENT_CLINIC_OR_DEPARTMENT_OTHER)
Admission: EM | Admit: 2011-01-27 | Discharge: 2011-01-27 | Discharge status: Against Medical Advice | Payer: Medicare HMO | Attending: Emergency Medicine | Admitting: Emergency Medicine

## 2011-01-27 ENCOUNTER — Emergency Department (INDEPENDENT_AMBULATORY_CARE_PROVIDER_SITE_OTHER): Payer: Medicare HMO

## 2011-01-27 ENCOUNTER — Other Ambulatory Visit: Payer: Self-pay

## 2011-01-27 DIAGNOSIS — R079 Chest pain, unspecified: Secondary | ICD-10-CM | POA: Insufficient documentation

## 2011-01-27 DIAGNOSIS — R0789 Other chest pain: Secondary | ICD-10-CM

## 2011-01-27 DIAGNOSIS — R0602 Shortness of breath: Secondary | ICD-10-CM

## 2011-01-27 DIAGNOSIS — M4120 Other idiopathic scoliosis, site unspecified: Secondary | ICD-10-CM

## 2011-01-27 HISTORY — DX: Unspecified hearing loss, unspecified ear: H91.90

## 2011-01-27 LAB — COMPREHENSIVE METABOLIC PANEL
AST: 91 U/L — ABNORMAL HIGH (ref 0–37)
BUN: 18 mg/dL (ref 6–23)
CO2: 29 mEq/L (ref 19–32)
Calcium: 9.5 mg/dL (ref 8.4–10.5)
Chloride: 100 mEq/L (ref 96–112)
Creatinine, Ser: 0.7 mg/dL (ref 0.50–1.10)
GFR calc non Af Amer: >60 mL/min (ref >60)
Total Bilirubin: 0.7 mg/dL (ref 0.3–1.2)

## 2011-01-27 LAB — URINALYSIS, ROUTINE W REFLEX MICROSCOPIC
Leukocytes, UA: NEGATIVE
Nitrite: NEGATIVE
Protein, ur: NEGATIVE mg/dL
Specific Gravity, Urine: 1.007 (ref 1.005–1.030)
Urobilinogen, UA: 0.2 mg/dL (ref 0.0–1.0)

## 2011-01-27 LAB — D-DIMER, QUANTITATIVE: D-Dimer, Quant: 0.38 ug/mL-FEU (ref 0.00–0.48)

## 2011-01-27 LAB — TROPONIN I: Troponin I: <0.3 ng/mL (ref <0.30)

## 2011-01-27 MED ORDER — RANITIDINE HCL 150 MG/10ML PO SYRP
150.0000 mg | ORAL_SOLUTION | Freq: Once | ORAL | Status: AC
  Administered 2011-01-27: 150 mg via ORAL
  Filled 2011-01-27: qty 10

## 2011-01-27 MED ORDER — SODIUM CHLORIDE 0.9 % IV SOLN: INTRAVENOUS | Status: DC

## 2011-01-27 NOTE — ED Notes (Signed)
Pt d/c home with family- advised to return if needed- no new rx given

## 2011-01-27 NOTE — ED Notes (Signed)
Pt having substernal chest pain, no radiation since 1030 this am.  Pt has some diaphoresis, sob, and lightheadedness.  Pt states pain has eased some.

## 2011-01-29 NOTE — Telephone Encounter (Signed)
Pt calling to report extreme chest appt as of Saturday. Pt would like to talk to nurse prior to scheduling appt following pt sever chest pain and visited ED for the day. Please return pt all to discuss further.

## 2011-01-30 ENCOUNTER — Telehealth: Payer: Self-pay | Admitting: Cardiology

## 2011-01-30 NOTE — Telephone Encounter (Signed)
Pt calling regarding call from yesterday. Pt was in ER Saturday with severe chest pains and was advised to get in touch with cardiologist first thing this week. Please return pt call.   After 11 am [phone]

## 2011-01-30 NOTE — Telephone Encounter (Signed)
Patient was in the ER on Saturday with chest pain. She was calling to let Dr.Hochrein know about it. Patient was offered to have an appointment made  with Dr. Antoine Poche or [facility], patient states she was only calling to let MD know about it. She is coming to the office with her husband for an appointment this Friday, she will ask Dr. Antoine Poche then if she needs to make an appointment.

## 2011-02-02 ENCOUNTER — Other Ambulatory Visit: Payer: Self-pay | Admitting: *Deleted

## 2011-02-02 DIAGNOSIS — E785 Hyperlipidemia, unspecified: Secondary | ICD-10-CM

## 2011-02-02 MED ORDER — PRAVASTATIN SODIUM 40 MG PO TABS: 40.0000 mg | ORAL_TABLET | Freq: Every evening | ORAL | Status: DC

## 2011-02-14 ENCOUNTER — Telehealth: Payer: Self-pay | Admitting: Cardiology

## 2011-02-14 NOTE — Telephone Encounter (Signed)
Having some chest pain at 9a today, has subsided, took one aspirin, happened a few week ago and dr hochrein told to call next time it happened, wasn't as bad as last time though

## 2011-02-14 NOTE — Telephone Encounter (Signed)
I would like to see her in my office sometime this week to discuss this discomfort further.  She did have a negative ETT recently.

## 2011-02-14 NOTE — Telephone Encounter (Signed)
I have given pt appt to see Dr Antoine Poche 02/16/11 at 9am. Pt is aware of this appt time.

## 2011-02-14 NOTE — Telephone Encounter (Signed)
I talked with pt. Pt states earlier today she had pressure in the center of her chest while washing dishes. This lasted about 1 hour. Pt states she did not have any other symptoms associated with this.  She states this is similar to episode she had a week or so ago but it was not as severe. Pt states she talked with Dr Antoine Poche about this and  he asked her to call back if it happened again. Pt states she feels OK now. I will forward to Dr Antoine Poche for review and recommendations.

## 2011-02-16 ENCOUNTER — Ambulatory Visit (INDEPENDENT_AMBULATORY_CARE_PROVIDER_SITE_OTHER): Payer: Medicare HMO | Admitting: Cardiology

## 2011-02-16 ENCOUNTER — Encounter: Payer: Self-pay | Admitting: *Deleted

## 2011-02-16 ENCOUNTER — Encounter: Payer: Self-pay | Admitting: Cardiology

## 2011-02-16 DIAGNOSIS — R0789 Other chest pain: Secondary | ICD-10-CM

## 2011-02-16 DIAGNOSIS — I739 Peripheral vascular disease, unspecified: Secondary | ICD-10-CM

## 2011-02-16 DIAGNOSIS — Z01810 Encounter for preprocedural cardiovascular examination: Secondary | ICD-10-CM

## 2011-02-16 DIAGNOSIS — R079 Chest pain, unspecified: Secondary | ICD-10-CM

## 2011-02-16 DIAGNOSIS — R072 Precordial pain: Secondary | ICD-10-CM

## 2011-02-16 DIAGNOSIS — E785 Hyperlipidemia, unspecified: Secondary | ICD-10-CM

## 2011-02-16 LAB — CBC WITH DIFFERENTIAL/PLATELET
Basophils Absolute: 0 10*3/uL (ref 0.0–0.1)
HCT: 41.3 % (ref 36.0–46.0)
Lymphs Abs: 3.2 10*3/uL (ref 0.7–4.0)
Monocytes Relative: 9.9 % (ref 3.0–12.0)
Neutrophils Relative %: 58.1 % (ref 43.0–77.0)
Platelets: 101 10*3/uL — ABNORMAL LOW (ref 150.0–400.0)
RDW: 13.9 % (ref 11.5–14.6)
WBC: 10.6 10*3/uL — ABNORMAL HIGH (ref 4.5–10.5)

## 2011-02-16 LAB — PROTIME-INR
INR: 1.1 ratio — ABNORMAL HIGH (ref 0.8–1.0)
Prothrombin Time: 11.6 s (ref 10.2–12.4)

## 2011-02-16 LAB — BASIC METABOLIC PANEL
BUN: 15 mg/dL (ref 6–23)
Calcium: 9.1 mg/dL (ref 8.4–10.5)
Creatinine, Ser: 0.8 mg/dL (ref 0.4–1.2)
GFR: 74.53 mL/min (ref >60.00)
Glucose, Bld: 100 mg/dL — ABNORMAL HIGH (ref 70–99)
Potassium: 4.4 mEq/L (ref 3.5–5.1)

## 2011-02-16 NOTE — Assessment & Plan Note (Addendum)
Her LDL was 138 recently. I had started pravastatin and with this result when up on the dose. I will followup on this.  Of note her AST and ALT are slightly elevated.  I will follow up on this.  Pravastatin should not affect this.  I will have a low threshold for GI evaluation or GI imaging.

## 2011-02-16 NOTE — Assessment & Plan Note (Signed)
This is very concerning for unstable angina. She did have an exercise treadmill test several weeks ago that was negative. However, she has known peripheral vascular disease and significant ongoing risk factors. I think the pretest probability of obstructive coronary disease is high. Cardiac catheterization is indicated.

## 2011-02-16 NOTE — Progress Notes (Signed)
HPI The patient presents for evaluation of chest pain.  Since I last saw her she has had two episodes of chest pain.  The first episode was about 3 weeks ago. She had a sharp chest discomfort substernal. It was associated with nausea. He did not have radiation to her jaw or to her arms. She had never had this kind of discomfort before. She presented to the emergency room but she ruled out for myocardial infarction and was not admitted. She has had a second episode about 2 days ago. This was a squeezing discomfort in her mid chest radiating through to her back. It was 7/10 in intensity. Again there was no radiation. There was no nausea vomiting or diaphoresis. It lasted for possibly a couple of hours. He spontaneously. He is going to arms and vigorously stir some cookie dough.  She has since not done any activity.  She has had no palpitations, PND or orthopnea.    Allergies  Allergen Reactions  . Aspirin Other (See Comments)    ITP    Current Outpatient Prescriptions  Medication Sig Dispense Refill  . amoxicillin (AMOXIL) 500 MG capsule Take 2,000 mg by mouth as needed. Use for dental procedures      . aspirin 325 MG tablet Take 325 mg by mouth once.        . bimatoprost (LUMIGAN) 0.03 % ophthalmic solution Place 1 drop into both eyes daily.        . Biotin (APPEAREX) 2.5 MG TABS Take by mouth daily.        . Calcium-Magnesium-Vitamin D (CITRACAL CALCIUM+D) 600-40-500 MG-MG-UNIT TB24 Take 1 tablet by mouth daily.       [name] docusate sodium (COLACE) 100 MG capsule Take 100 mg by mouth daily.       [name] estrogens, conjugated, (PREMARIN) 0.3 MG tablet Take 0.3 mg by mouth every 3 (three) days. Take daily for 21 days then do not take for 7 days.      . Flaxseed, Linseed, (FLAX SEEDS PO) Take 15 mLs by mouth daily.        . Flaxseed, Linseed, (FLAXSEED OIL) 1000 MG CAPS Take 1 capsule by mouth daily.       . Glucosamine 500 MG TABS Take by mouth daily.        . Glucosamine-Chondroitin (GLUCOSAMINE CHONDR  COMPLEX PO) Take 1 tablet by mouth daily.        . Magnesium 500 MG TABS Take 1 tablet by mouth daily.       . Multiple Vitamin (MULTIVITAMIN) tablet Take 1 tablet by mouth daily.        . pravastatin (PRAVACHOL) 40 MG tablet Take 1 tablet (40 mg total) by mouth every evening.  30 tablet  11  . predniSONE (DELTASONE) 2.5 MG tablet Take 2.5 mg by mouth every other day.       . ranitidine (ZANTAC) 150 MG tablet Take 150 mg by mouth 2 (two) times daily.        . vitamin C (ASCORBIC ACID) 500 MG tablet Take 500 mg by mouth daily.          Past Medical History  Diagnosis Date  . OSTEOARTHRITIS, KNEE, RIGHT   . URINARY INCONTINENCE   . Irritable bowel syndrome   . DIVERTICULITIS, HX OF   . OSTEOPENIA   . Immune thrombocytopenic purpura     chronic 80-100K  . UNSPECIFIED PERIPHERAL VASCULAR DISEASE   . OVERACTIVE BLADDER   . DYSLIPIDEMIA   . Glaucoma   .  GERD   . Hx of colonoscopy   . HOH (hard of hearing)     Past Surgical History  Procedure Date  . Splenectomy 1954  . Abdominal hysterectomy 1963  . L pop pta 10/2009  . Colectomy, simoid 10/2005  . Cataract extraction, bilateral 10/2010    ROS:  As stated in the HPI and negative for all other systems.   PHYSICAL EXAM BP 142/68  Pulse 70  Ht 5' 2.5" (1.588 m)  Wt 141 lb (63.957 kg)  BMI 25.38 kg/m2GENERAL:  Well appearing HEENT:  Pupils equal round and reactive, fundi not visualized, oral mucosa unremarkable NECK:  No jugular venous distention, waveform within normal limits, carotid upstroke brisk and symmetric, no bruits, no thyromegaly LYMPHATICS:  No cervical, inguinal adenopathy LUNGS:  Clear to auscultation bilaterally BACK:  No CVA tenderness CHEST:  Unremarkable HEART:  PMI not displaced or sustained,S1 and S2 within normal limits, no S3, no S4, no clicks, no rubs, no murmurs ABD:  Flat, positive bowel sounds normal in frequency in pitch, no bruits, no rebound, no guarding, no midline pulsatile mass, no hepatomegaly,  no splenomegaly EXT:  2 plus pulses throughout, no edema, no cyanosis no clubbing SKIN:  No rashes no nodules NEURO:  Cranial nerves II through XII grossly intact, motor grossly intact throughout PSYCH:  Cognitively intact, oriented to person place and time  EKG:   Sinus rhythm 70, rate, axis within normal limits, intervals within normal limits, no acute ST-T wave changes.   ASSESSMENT AND PLAN

## 2011-02-16 NOTE — Patient Instructions (Signed)
The current medical regimen is effective;  continue present plan and medications.  Your physician has requested that you have a cardiac catheterization. Cardiac catheterization is used to diagnose and/or treat various heart conditions. Doctors may recommend this procedure for a number of different reasons. The most common reason is to evaluate chest pain. Chest pain can be a symptom of coronary artery disease (CAD), and cardiac catheterization can show whether plaque is narrowing or blocking your heart's arteries. This procedure is also used to evaluate the valves, as well as measure the blood flow and oxygen levels in different parts of your heart. For further information please visit [email or web address]. Please follow instruction sheet, as given.   

## 2011-02-16 NOTE — Assessment & Plan Note (Signed)
I will continue with aggressive risk reduction.

## 2011-02-20 ENCOUNTER — Inpatient Hospital Stay (HOSPITAL_BASED_OUTPATIENT_CLINIC_OR_DEPARTMENT_OTHER)
Admission: RE | Admit: 2011-02-20 | Discharge: 2011-02-20 | Disposition: A | Discharge status: Home / Self Care | Payer: Medicare HMO | Source: Ambulatory Visit | Attending: Cardiology | Admitting: Cardiology

## 2011-02-20 DIAGNOSIS — I2510 Atherosclerotic heart disease of native coronary artery without angina pectoris: Secondary | ICD-10-CM

## 2011-02-20 DIAGNOSIS — R079 Chest pain, unspecified: Secondary | ICD-10-CM | POA: Insufficient documentation

## 2011-02-26 ENCOUNTER — Encounter: Payer: Self-pay | Admitting: *Deleted

## 2011-02-28 ENCOUNTER — Ambulatory Visit (INDEPENDENT_AMBULATORY_CARE_PROVIDER_SITE_OTHER): Payer: Medicare HMO | Admitting: Internal Medicine

## 2011-02-28 ENCOUNTER — Other Ambulatory Visit (INDEPENDENT_AMBULATORY_CARE_PROVIDER_SITE_OTHER): Payer: Medicare HMO

## 2011-02-28 ENCOUNTER — Ambulatory Visit (INDEPENDENT_AMBULATORY_CARE_PROVIDER_SITE_OTHER)
Admission: RE | Admit: 2011-02-28 | Discharge: 2011-02-28 | Disposition: A | Discharge status: Home / Self Care | Payer: Medicare HMO | Source: Ambulatory Visit

## 2011-02-28 ENCOUNTER — Encounter: Payer: Self-pay | Admitting: Internal Medicine

## 2011-02-28 VITALS — BP 142/70 | HR 81 | Temp 97.6°F | Ht 62.0 in | Wt 142.8 lb

## 2011-02-28 DIAGNOSIS — Z1231 Encounter for screening mammogram for malignant neoplasm of breast: Secondary | ICD-10-CM

## 2011-02-28 DIAGNOSIS — Z1239 Encounter for other screening for malignant neoplasm of breast: Secondary | ICD-10-CM

## 2011-02-28 DIAGNOSIS — Z0000 Encounter for general adult medical examination without abnormal findings: Secondary | ICD-10-CM

## 2011-02-28 DIAGNOSIS — Z13820 Encounter for screening for osteoporosis: Secondary | ICD-10-CM

## 2011-02-28 DIAGNOSIS — Z23 Encounter for immunization: Secondary | ICD-10-CM

## 2011-02-28 DIAGNOSIS — E785 Hyperlipidemia, unspecified: Secondary | ICD-10-CM

## 2011-02-28 LAB — LIPID PANEL
Total CHOL/HDL Ratio: 3
Triglycerides: 52 mg/dL (ref 0.0–149.0)

## 2011-02-28 NOTE — Progress Notes (Signed)
Subjective:    Patient ID: Rakira Bentham, female    DOB: 10/17/1929, 77 y.o.   MRN: [id number]  HPI  Here for medicare wellness  Diet: heart healthy Physical activity: sedentary Depression/mood screen: negative Hearing: intact to whispered voice Visual acuity: grossly normal, performs annual eye exam  ADLs: capable Fall risk: none Home safety: good Cognitive evaluation: intact to orientation, naming, recall and repetition EOL planning: adv directives, full code/ I agree  I have personally reviewed and have noted 1. The patient's medical and social history 2. Their use of alcohol, tobacco or illicit drugs 3. Their current medications and supplements 4. The patient's functional ability including ADL's, fall risks, home safety risks and hearing or visual impairment. 5. Diet and physical activities 6. Evidence for depression or mood disorders  Also reviewed chronic medical issues:  Episodic chest pain summer 2012 - status post cardiac eval with neg cath 02/14/2011 (no obst CAD)  chronic ITP - dx 1950s s/p splenectomy- on low dose pred to control same - usual count 80-100K, occ flares with illness - remote hosp to tx acute exac, no severe flares in recent years on current tx - no brusing or bleeding symptoms   dyslipidemia - trial simva fall 2011 poorly tol due to myalgias -  declines other statin trial tx - follows low fat diet and exercises for weight control (walking)   GERD with HH - chronic hx, uses otc med as needed for same -  no abd pain, bowel change or nausea and vomiting, anorexia symptoms   OA - R knee, R>L hip and low back ddd - occ steroid injections for acute flares - no swelling or need for daily pain med relief - uses glucosamine - prev followed with ortho but not re-estab since move to [place]  PVD s/p stent L - no claudication or leg pain - follows with cards   Past Medical History  Diagnosis Date  . OSTEOARTHRITIS, KNEE, RIGHT   . URINARY INCONTINENCE   .  Irritable bowel syndrome   . DIVERTICULITIS, HX OF   . OSTEOPENIA   . Immune thrombocytopenic purpura     chronic 80-100K  . UNSPECIFIED PERIPHERAL VASCULAR DISEASE   . OVERACTIVE BLADDER   . DYSLIPIDEMIA   . Glaucoma   . GERD   . Hx of colonoscopy   . HOH (hard of hearing)    Family History  Problem Relation Age of Onset  . Coronary artery disease Mother   . Heart attack Mother 57  . Hyperlipidemia Mother   . Hypertension Mother   . Stomach cancer Father   . Hypertension Daughter   . Hyperlipidemia Daughter   . Arthritis      parent  . Transient ischemic attack      parent   History  Substance Use Topics  . Smoking status: Never Smoker   . 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 [place] Ham to [place] from [place] 05/2010 to be close to kids  . Alcohol Use: Yes     rarely    Review of Systems  Respiratory: Negative for shortness of breath.   Cardiovascular: Negative for leg swelling.  Musculoskeletal: Negative for back pain and gait problem.  Hematological: Does not bruise/bleed easily.  Neurological: Negative for dizziness or headache.  No other specific complaints in a complete review of systems (except as listed in HPI above).      Objective:   Physical Exam  BP  142/70  Pulse 81  Temp(Src) 97.6 F (36.4 C) (Oral)  Ht 5\' 2"  (1.575 m)  Wt 142 lb 12.8 oz (64.774 kg)  BMI 26.12 kg/m2  SpO2 94% Wt Readings from Last 3 Encounters:  02/28/11 142 lb 12.8 oz (64.774 kg)  02/16/11 141 lb (63.957 kg)  01/27/11 138 lb (62.596 kg)   Constitutional: She appears well-developed and well-nourished. No distress. Neck: Normal range of motion. Neck supple. No JVD present. No thyromegaly present.  Cardiovascular: Normal rate, regular rhythm and normal heart sounds.  No murmur heard. No BLE edema. Pulmonary/Chest: Effort normal and breath sounds normal. No respiratory distress. She has no wheezes.  Abd: SNTND+BS, no  mass Musculoskeletal: Tender to palpation over greater troch bursa bilaterally. No groin pain. Normal range of motion hips, knees. No gross deformities Neurological: She is alert and oriented to person, place, and time. No cranial nerve deficit. Coordination normal.  Skin: Skin is warm and dry. No rash noted. No erythema.  Psychiatric: She has a normal mood and affect. Her behavior is normal. Judgment and thought content normal.   Lab Results  Component Value Date   WBC 10.6* 02/16/2011   HGB 13.2 02/16/2011   HCT 41.3 02/16/2011   PLT 101.0* 02/16/2011   CHOL 208* 01/23/2011   TRIG 135.0 01/23/2011   HDL 54.20 01/23/2011   LDLDIRECT 138.1 01/23/2011   ALT 41* 01/27/2011   AST 91* 01/27/2011   NA 142 02/16/2011   K 4.4 02/16/2011   CL 105 02/16/2011   CREATININE 0.8 02/16/2011   BUN 15 02/16/2011   CO2 31 02/16/2011   TSH 1.40 01/23/2011   INR 1.1* 02/16/2011       Assessment & Plan:  Thomasene Mohair /CPX -v70.0 - Today patient counseled on age appropriate routine health concerns for screening and prevention, each reviewed and up to date or declined. Immunizations reviewed and up to date or declined. recent labs/ECG reviewed. Risk factors for depression reviewed and negative. Hearing function and visual acuity are intact. ADLs screened and addressed as needed. Functional ability and level of safety reviewed and appropriate. Education, counseling and referrals performed based on assessed risks today. Patient provided with a copy of personalized plan for preventive services.  Also see problem list. Medications and labs reviewed today.

## 2011-02-28 NOTE — Assessment & Plan Note (Signed)
Declines statin due to myalgias -  prev on simva following PVD intervention 2011 (L pop PTA), but poor tol same (adv side effects) Check now and adjust tx prn Lipid Panel     Component Value Date/Time   CHOL 208* 01/23/2011 1149   TRIG 135.0 01/23/2011 1149   HDL 54.20 01/23/2011 1149   CHOLHDL 4 01/23/2011 1149   VLDL 27.0 01/23/2011 1149   LDLCALC 127 02/20/2010

## 2011-02-28 NOTE — Patient Instructions (Signed)
It was good to see you today. Test(s) ordered today. Your results will be called to you after review (48-72hours after test completion). If any changes need to be made, you will be notified at that time. we'll make referral for mammogram and for bone density. Our office will contact you regarding appointment(s) once made. Think about the colonoscopy Shots updated today Medications reviewed, no changes at this time. Please schedule followup in 6 months, call sooner if problems.

## 2011-03-01 ENCOUNTER — Telehealth: Payer: Self-pay | Admitting: Cardiology

## 2011-03-01 NOTE — Telephone Encounter (Signed)
Pt wants Dr Antoine Poche to review her lipid profile that was done yesterday.  She is quite proud of the results.  Will forward message to Dr Antoine Poche for his review.

## 2011-03-01 NOTE — Telephone Encounter (Signed)
Pt is concerned about lipid panel she had done yesterday.

## 2011-03-04 NOTE — ED Provider Notes (Signed)
History     CSN: [id number] Arrival date & time: 01/27/2011 11:44 AM   None     Chief Complaint  Patient presents with  . Chest Pain    (Consider location/radiation/quality/duration/timing/severity/associated sxs/prior treatment) Patient is a 77 y.o. female presenting with chest pain. The history is provided by the patient and the spouse.  Chest Pain The chest pain began 1 - 2 hours ago (Onset at 10:30 in the morning.). Chest pain occurs constantly. The chest pain is improving. At its most intense, the pain is at 8/10. The severity of the pain is moderate. The pain does not radiate. Exacerbated by: Nothing. Primary symptoms include shortness of breath and nausea. Pertinent negatives for primary symptoms include no fever, no cough, no wheezing, no abdominal pain, no vomiting and no dizziness.    chest pain is substernal. A shared with nausea shortness of breath  and diaphoresis. No vomiting. She reports history of negative stress test. Patient was given aspirin 325 mg.  Past Medical History  Diagnosis Date  . OSTEOARTHRITIS, KNEE, RIGHT   . URINARY INCONTINENCE   . Irritable bowel syndrome   . DIVERTICULITIS, HX OF   . OSTEOPENIA   . Immune thrombocytopenic purpura     chronic 80-100K  . UNSPECIFIED PERIPHERAL VASCULAR DISEASE   . OVERACTIVE BLADDER   . DYSLIPIDEMIA   . Glaucoma   . GERD   . Hx of colonoscopy   . HOH (hard of hearing)     Past Surgical History  Procedure Date  . Splenectomy 1954  . Abdominal hysterectomy 1963  . L pop pta 10/2009  . Colectomy, simoid 10/2005  . Cataract extraction, bilateral 10/2010    Family History  Problem Relation Age of Onset  . Coronary artery disease Mother   . Heart attack Mother 35  . Hyperlipidemia Mother   . Hypertension Mother   . Stomach cancer Father   . Hypertension Daughter   . Hyperlipidemia Daughter   . Arthritis      parent  . Transient ischemic attack      parent    History  Substance Use Topics  .  Smoking status: Never Smoker   . 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 Linton Ham to [place] from [facility] 05/2010 to be close to kids  . Alcohol Use: Yes     rarely    OB History    Grav Para Term Preterm Abortions TAB SAB Ect Mult Living                  Review of Systems  Constitutional: Negative for fever.  HENT: Negative for congestion and neck pain.   Eyes: Negative for visual disturbance.  Respiratory: Positive for shortness of breath. Negative for cough, wheezing and stridor.   Cardiovascular: Positive for chest pain. Negative for leg swelling.  Gastrointestinal: Positive for nausea. Negative for vomiting, abdominal pain and diarrhea.  Genitourinary: Negative for dysuria and hematuria.  Musculoskeletal: Negative for back pain.  Skin: Negative for rash.  Neurological: Negative for dizziness and headaches.  Hematological: Does not bruise/bleed easily.    Allergies  Aspirin  Home Medications   Current Outpatient Rx  Name Route Sig Dispense Refill  . AMOXICILLIN 500 MG PO CAPS Oral Take 2,000 mg by mouth as needed. Use for dental procedures    . ASPIRIN 325 MG PO TABS Oral Take 325 mg by mouth once.      [name] BIOTIN 2.5  MG PO TABS Oral Take by mouth daily.      [name] CALCIUM-MAGNESIUM-VITAMIN D 600-40-500 MG-MG-UNIT PO TB24 Oral Take 1 tablet by mouth daily.     [name] DOCUSATE SODIUM 100 MG PO CAPS Oral Take 100 mg by mouth daily.     [name] ESTROGENS CONJUGATED 0.3 MG PO TABS Oral Take 0.3 mg by mouth every 3 (three) days. Take daily for 21 days then do not take for 7 days.    [name] FLAXSEED OIL 1000 MG PO CAPS Oral Take 1 capsule by mouth daily.     [name] GLUCOSAMINE CHONDR COMPLEX PO Oral Take 1 tablet by mouth daily.      [name] MAGNESIUM 500 MG PO TABS Oral Take 1 tablet by mouth daily.     [name] ONE-DAILY MULTI VITAMINS PO TABS Oral Take 1 tablet by mouth daily.      [name] PREDNISONE 2.5 MG PO TABS Oral Take 2.5 mg by mouth every other day.       [name] RANITIDINE HCL 150 MG PO TABS Oral Take 150 mg by mouth 2 (two) times daily.      [name] VITAMIN C 500 MG PO TABS Oral Take 500 mg by mouth daily.      [name] BIMATOPROST 0.01 % OP SOLN Both Eyes Place 1 drop into both eyes daily.      [name] PRAVASTATIN SODIUM 40 MG PO TABS Oral Take 1 tablet (40 mg total) by mouth every evening. 30 tablet 11    BP 172/60  Pulse 69  Temp(Src) 98.3 F (36.8 C) (Oral)  Resp 20  Ht 5\' 2"  (1.575 m)  Wt 138 lb (62.596 kg)  BMI 25.24 kg/m2  SpO2 99%  Physical Exam  Nursing note and vitals reviewed. Constitutional: She is oriented to person, place, and time. She appears well-developed and well-nourished. No distress.  HENT:  Head: Normocephalic and atraumatic.  Mouth/Throat: Oropharynx is clear and moist.  Eyes: Conjunctivae and EOM are normal. Pupils are equal, round, and reactive to light.  Neck: Normal range of motion. Neck supple.  Cardiovascular: Normal rate, regular rhythm and normal heart sounds.   No murmur heard. Pulmonary/Chest: Effort normal and breath sounds normal. She has no wheezes. She has no rales. She exhibits no tenderness.  Abdominal: Soft. Bowel sounds are normal. There is no tenderness.  Musculoskeletal: Normal range of motion. She exhibits no edema and no tenderness.  Neurological: She is alert and oriented to person, place, and time. No cranial nerve deficit. She exhibits normal muscle tone. Coordination normal.  Skin: Skin is warm and dry. No rash noted.    ED Course  Procedures (including critical care time)  Labs Reviewed  COMPREHENSIVE METABOLIC PANEL - Abnormal; Notable for the following:    Glucose, Bld 101 (*)    AST 91 (*)    ALT 41 (*)    All other components within normal limits  URINALYSIS, ROUTINE W REFLEX MICROSCOPIC  D-DIMER, QUANTITATIVE  TROPONIN I  TROPONIN I   Results for orders placed during the hospital encounter of 01/27/11  COMPREHENSIVE METABOLIC PANEL      Component Value Range   Sodium 138  135 - 145  (mEq/L)   Potassium 4.2  3.5 - 5.1 (mEq/L)   Chloride 100  96 - 112 (mEq/L)   CO2 29  19 - 32 (mEq/L)   Glucose, Bld 101 (*) 70 - 99 (mg/dL)   BUN 18  6 - 23 (mg/dL)   Creatinine, Ser 1.61  0.50 - 1.10 (  mg/dL)   Calcium 9.5  8.4 - 16.1 (mg/dL)   Total Protein 7.2  6.0 - 8.3 (g/dL)   Albumin 3.7  3.5 - 5.2 (g/dL)   AST 91 (*) 0 - 37 (U/L)   ALT 41 (*) 0 - 35 (U/L)   Alkaline Phosphatase 100  39 - 117 (U/L)   Total Bilirubin 0.7  0.3 - 1.2 (mg/dL)   GFR calc non Af Amer >60  >60 (mL/min)   GFR calc Af Amer >60  >60 (mL/min)  URINALYSIS, ROUTINE W REFLEX MICROSCOPIC      Component Value Range   Color, Urine YELLOW  YELLOW    Appearance CLEAR  CLEAR    Specific Gravity, Urine 1.007  1.005 - 1.030    pH 7.5  5.0 - 8.0    Glucose, UA NEGATIVE  NEGATIVE (mg/dL)   Hgb urine dipstick NEGATIVE  NEGATIVE    Bilirubin Urine NEGATIVE  NEGATIVE    Ketones, ur NEGATIVE  NEGATIVE (mg/dL)   Protein, ur NEGATIVE  NEGATIVE (mg/dL)   Urobilinogen, UA 0.2  0.0 - 1.0 (mg/dL)   Nitrite NEGATIVE  NEGATIVE    Leukocytes, UA NEGATIVE  NEGATIVE   D-DIMER, QUANTITATIVE      Component Value Range   D-Dimer, Quant 0.38  0.00 - 0.48 (ug/mL-FEU)  TROPONIN I      Component Value Range   Troponin I <0.30  <0.30 (ng/mL)  TROPONIN I      Component Value Range   Troponin I <0.30  <0.30 (ng/mL)   Results for orders placed during the hospital encounter of 01/27/11  COMPREHENSIVE METABOLIC PANEL      Component Value Range   Sodium 138  135 - 145 (mEq/L)   Potassium 4.2  3.5 - 5.1 (mEq/L)   Chloride 100  96 - 112 (mEq/L)   CO2 29  19 - 32 (mEq/L)   Glucose, Bld 101 (*) 70 - 99 (mg/dL)   BUN 18  6 - 23 (mg/dL)   Creatinine, Ser 0.96  0.50 - 1.10 (mg/dL)   Calcium 9.5  8.4 - 04.5 (mg/dL)   Total Protein 7.2  6.0 - 8.3 (g/dL)   Albumin 3.7  3.5 - 5.2 (g/dL)   AST 91 (*) 0 - 37 (U/L)   ALT 41 (*) 0 - 35 (U/L)   Alkaline Phosphatase 100  39 - 117 (U/L)   Total Bilirubin 0.7  0.3 - 1.2 (mg/dL)   GFR calc  non Af Amer >60  >60 (mL/min)   GFR calc Af Amer >60  >60 (mL/min)  URINALYSIS, ROUTINE W REFLEX MICROSCOPIC      Component Value Range   Color, Urine YELLOW  YELLOW    Appearance CLEAR  CLEAR    Specific Gravity, Urine 1.007  1.005 - 1.030    pH 7.5  5.0 - 8.0    Glucose, UA NEGATIVE  NEGATIVE (mg/dL)   Hgb urine dipstick NEGATIVE  NEGATIVE    Bilirubin Urine NEGATIVE  NEGATIVE    Ketones, ur NEGATIVE  NEGATIVE (mg/dL)   Protein, ur NEGATIVE  NEGATIVE (mg/dL)   Urobilinogen, UA 0.2  0.0 - 1.0 (mg/dL)   Nitrite NEGATIVE  NEGATIVE    Leukocytes, UA NEGATIVE  NEGATIVE   D-DIMER, QUANTITATIVE      Component Value Range   D-Dimer, Quant 0.38  0.00 - 0.48 (ug/mL-FEU)  TROPONIN I      Component Value Range   Troponin I <0.30  <0.30 (ng/mL)  TROPONIN I  Component Value Range   Troponin I <0.30  <0.30 (ng/mL)   Chest x-ray negative for acute changes.    1. Chest pain       MDM  Workup in the emergency room and negative for acute cardiac event. Patient with negative troponins x2. EKG without acute findings. Chest x-ray negative for pneumonia. D-dimer not consistent with pulmonary embolism. Patient will be discharged to home. Patient improved and pain-free at the time of discharge.        Shelda Jakes, MD 03/04/11 [phone]

## 2011-03-06 NOTE — Cardiovascular Report (Signed)
Tamara Robertson, BURGES NO.:  [id number]  MEDICAL RECORD NO.:  [id number]  LOCATION:  [facility]                          FACILITY:  [facility]  PHYSICIAN:  Rollene Rotunda, MD, FACCDATE OF BIRTH:  1929/12/23  DATE OF PROCEDURE:  02/20/2011 DATE OF DISCHARGE:  01/27/2011                           CARDIAC CATHETERIZATION   PRIMARY CARE PROVIDER:  Vikki Ports A. Felicity Coyer, MD  PROCEDURE:  Left heart catheterization/coronary arteriography.  INDICATION:  Evaluate patient with chest pain suggestive of unstable angina.  PROCEDURE NOTE:  Left heart catheterization performed via right femoral artery.  There artery was cannulated using anterior wall puncture.  A #4- [place] arterial sheath was inserted via the modified Seldinger technique.  Preformed Judkins and a pigtail catheter were utilized.  The patient tolerated the procedure well and left the lab in stable condition.  RESULTS:  Hemodynamics:  LV 149/19, AO 136/97.  Coronaries:  The left main was normal.  The LAD had diffuse luminal irregularities.  First and second diagonal were small and normal.  Ramus intermediate was large with long 25% stenosis.  The circumflex in the AV groove had luminal irregularities.  Obtuse marginal 1 was small and normal.  OM 2 was moderate size and normal.  Posterolateral was small and normal.  The right coronary artery was a dominant vessel.  There was proximal 25% stenosis.  There were mid tandem 40% lesions.  The PDA was moderate size and normal.  Left ventriculogram:  The left ventriculogram was obtained in the RAO projection.  The EF was 65% with normal wall motion.  CONCLUSION:  Mild coronary plaque.  Normal left ventricular function.  PLAN:  The patient will have no further cardiac workup.  If she continues to have this chest discomfort, she can have evaluation for non- anginal etiologies.     Rollene Rotunda, MD, [facility]     JH/MEDQ  D:  02/20/2011  T:  02/20/2011  Job:  [id number]  cc:    Vikki Ports A. Felicity Coyer, MD  Electronically Signed by Rollene Rotunda MD [facility] on 03/06/2011 05:45:04 PM

## 2011-03-07 ENCOUNTER — Encounter: Payer: Self-pay | Admitting: Internal Medicine

## 2011-03-21 ENCOUNTER — Encounter: Payer: Medicare Other | Admitting: Internal Medicine

## 2011-04-02 ENCOUNTER — Other Ambulatory Visit (INDEPENDENT_AMBULATORY_CARE_PROVIDER_SITE_OTHER): Payer: Medicare HMO

## 2011-04-02 ENCOUNTER — Telehealth: Payer: Self-pay | Admitting: Cardiology

## 2011-04-02 DIAGNOSIS — E785 Hyperlipidemia, unspecified: Secondary | ICD-10-CM

## 2011-04-02 LAB — LIPID PANEL
Cholesterol: 172 mg/dL (ref 0–200)
HDL: 60.7 mg/dL (ref >39.00)
LDL Cholesterol: 88 mg/dL (ref 0–99)
VLDL: 23 mg/dL (ref 0.0–40.0)

## 2011-04-02 NOTE — Telephone Encounter (Signed)
Error

## 2011-04-03 ENCOUNTER — Ambulatory Visit (INDEPENDENT_AMBULATORY_CARE_PROVIDER_SITE_OTHER): Payer: Medicare HMO | Admitting: Cardiology

## 2011-04-03 ENCOUNTER — Encounter: Payer: Self-pay | Admitting: Cardiology

## 2011-04-03 DIAGNOSIS — E785 Hyperlipidemia, unspecified: Secondary | ICD-10-CM

## 2011-04-03 DIAGNOSIS — Z79899 Other long term (current) drug therapy: Secondary | ICD-10-CM

## 2011-04-03 DIAGNOSIS — E7800 Pure hypercholesterolemia, unspecified: Secondary | ICD-10-CM

## 2011-04-03 DIAGNOSIS — I739 Peripheral vascular disease, unspecified: Secondary | ICD-10-CM

## 2011-04-03 LAB — HEPATIC FUNCTION PANEL
ALT: 19 U/L (ref 0–35)
Total Bilirubin: 0.5 mg/dL (ref 0.3–1.2)
Total Protein: 7.2 g/dL (ref 6.0–8.3)

## 2011-04-03 MED ORDER — PRAVASTATIN SODIUM 40 MG PO TABS: 40.0000 mg | ORAL_TABLET | Freq: Every evening | ORAL | Status: DC

## 2011-04-03 NOTE — Progress Notes (Signed)
HPI The patient presents for evaluation of PVD.  Since I last saw her she has done well. She's walking routinely and has none of the claudication that she had previously. We reviewed her lipids today and her ratio, LDL and HDL were excellent. She's tolerating pravastatin. She has had mildly elevated liver enzymes in the past and these will need to be repeated. She denies any chest discomfort, neck or arm discomfort. She's had no new shortness of breath, PND or orthopnea. She's had no palpitations, presyncope or syncope.  Allergies  Allergen Reactions  . Aspirin Other (See Comments)    ITP    Current Outpatient Prescriptions  Medication Sig Dispense Refill  . amoxicillin (AMOXIL) 500 MG capsule Take 2,000 mg by mouth as needed. Use for dental procedures      . bimatoprost (LUMIGAN) 0.01 % SOLN Place 1 drop into both eyes daily.        . Biotin (APPEAREX) 2.5 MG TABS Take by mouth daily.        . Calcium-Magnesium-Vitamin D (CITRACAL CALCIUM+D) 600-40-500 MG-MG-UNIT TB24 Take 1 tablet by mouth daily.       [name] docusate sodium (COLACE) 100 MG capsule Take 100 mg by mouth daily.       [name] estrogens, conjugated, (PREMARIN) 0.3 MG tablet Take 0.3 mg by mouth every 3 (three) days. Take daily for 21 days then do not take for 7 days.      . Flaxseed, Linseed, (FLAXSEED OIL) 1000 MG CAPS Take 1 capsule by mouth daily.       . Glucosamine-Chondroitin (GLUCOSAMINE CHONDR COMPLEX PO) Take 1 tablet by mouth daily.        . Magnesium 500 MG TABS Take 1 tablet by mouth daily.       . Multiple Vitamin (MULTIVITAMIN) tablet Take 1 tablet by mouth daily.        . pravastatin (PRAVACHOL) 40 MG tablet Take 1 tablet (40 mg total) by mouth every evening.  30 tablet  11  . predniSONE (DELTASONE) 2.5 MG tablet Take 2.5 mg by mouth every other day.       . Probiotic Product (PROBIOTIC FORMULA PO) Take 1 tablet by mouth daily.        . ranitidine (ZANTAC) 150 MG tablet Take 150 mg by mouth 2 (two) times daily.        .  vitamin C (ASCORBIC ACID) 500 MG tablet Take 500 mg by mouth daily.        [name] aspirin 325 MG tablet Take 325 mg by mouth once.          Past Medical History  Diagnosis Date  . OSTEOARTHRITIS, KNEE, RIGHT   . URINARY INCONTINENCE   . Irritable bowel syndrome   . DIVERTICULITIS, HX OF   . OSTEOPENIA   . Immune thrombocytopenic purpura     chronic 80-100K  . UNSPECIFIED PERIPHERAL VASCULAR DISEASE   . OVERACTIVE BLADDER   . DYSLIPIDEMIA   . Glaucoma   . GERD   . Hx of colonoscopy   . HOH (hard of hearing)   . CAD (coronary artery disease)     RCA 40% stenosis cath 01/2011    Past Surgical History  Procedure Date  . Splenectomy 1954  . Abdominal hysterectomy 1963  . L pop pta 10/2009  . Colectomy, simoid 10/2005  . Cataract extraction, bilateral 10/2010    ROS:  As stated in the HPI and negative for all other systems.   PHYSICAL EXAM BP 143/75  Pulse 70  Resp 18  Ht 5\' 4"  (1.626 m)  Wt 141 lb 1.9 oz (64.012 kg)  BMI 24.22 kg/m2GENERAL:  Well appearing HEENT:  Pupils equal round and reactive, fundi not visualized, oral mucosa unremarkable NECK:  No jugular venous distention, waveform within normal limits, carotid upstroke brisk and symmetric, no bruits, no thyromegaly LYMPHATICS:  No cervical, inguinal adenopathy LUNGS:  Clear to auscultation bilaterally BACK:  No CVA tenderness CHEST:  Unremarkable HEART:  PMI not displaced or sustained,S1 and S2 within normal limits, no S3, no S4, no clicks, no rubs, no murmurs ABD:  Flat, positive bowel sounds normal in frequency in pitch, no bruits, no rebound, no guarding, no midline pulsatile mass, no hepatomegaly, no splenomegaly EXT:  2 plus pulses throughout, no edema, no cyanosis no clubbing SKIN:  No rashes no nodules NEURO:  Cranial nerves II through XII grossly intact, motor grossly intact throughout PSYCH:  Cognitively intact, oriented to person place and time  ASSESSMENT AND PLAN

## 2011-04-03 NOTE — Patient Instructions (Addendum)
Please have blood work drawn today (Hepatic panel)  Follow up in 1 year with Dr Antoine Poche.  You will receive a letter in the mail 2 months before you are due.  Please call us when you receive this letter to schedule your follow up appointment.

## 2011-04-03 NOTE — Assessment & Plan Note (Signed)
We reviewed the lipids done yesterday and she will continue with meds as listed.  I will check liver enzymes as these were slightly elevated in the past.

## 2011-04-03 NOTE — Assessment & Plan Note (Signed)
She has no new symptoms.  She will continue with the meds as listed and with walking.

## 2011-04-03 NOTE — Assessment & Plan Note (Signed)
She has had no further pain.  No change in therapy is indicated.

## 2011-04-04 ENCOUNTER — Ambulatory Visit
Admission: RE | Admit: 2011-04-04 | Discharge: 2011-04-04 | Disposition: A | Discharge status: Home / Self Care | Payer: Medicare HMO | Source: Ambulatory Visit | Attending: Internal Medicine | Admitting: Internal Medicine

## 2011-04-04 DIAGNOSIS — Z1231 Encounter for screening mammogram for malignant neoplasm of breast: Secondary | ICD-10-CM

## 2011-06-25 ENCOUNTER — Telehealth: Payer: Self-pay

## 2011-06-25 ENCOUNTER — Encounter: Payer: Self-pay | Admitting: Internal Medicine

## 2011-06-25 ENCOUNTER — Ambulatory Visit (INDEPENDENT_AMBULATORY_CARE_PROVIDER_SITE_OTHER): Payer: Medicare HMO | Admitting: Internal Medicine

## 2011-06-25 ENCOUNTER — Other Ambulatory Visit (INDEPENDENT_AMBULATORY_CARE_PROVIDER_SITE_OTHER): Payer: Medicare HMO

## 2011-06-25 VITALS — BP 142/70 | HR 75 | Temp 98.2°F

## 2011-06-25 DIAGNOSIS — K921 Melena: Secondary | ICD-10-CM

## 2011-06-25 DIAGNOSIS — J069 Acute upper respiratory infection, unspecified: Secondary | ICD-10-CM

## 2011-06-25 DIAGNOSIS — R32 Unspecified urinary incontinence: Secondary | ICD-10-CM

## 2011-06-25 DIAGNOSIS — D693 Immune thrombocytopenic purpura: Secondary | ICD-10-CM

## 2011-06-25 LAB — CBC WITH DIFFERENTIAL/PLATELET
Basophils Relative: 0.4 % (ref 0.0–3.0)
Eosinophils Relative: 0.3 % (ref 0.0–5.0)
Lymphocytes Relative: 31.6 % (ref 12.0–46.0)
Neutrophils Relative %: 53 % (ref 43.0–77.0)
RBC: 4.84 Mil/uL (ref 3.87–5.11)
WBC: 11.7 10*3/uL — ABNORMAL HIGH (ref 4.5–10.5)

## 2011-06-25 NOTE — Telephone Encounter (Signed)
A user error has taken place: encounter opened in error, closed for administrative reasons.

## 2011-06-25 NOTE — Patient Instructions (Signed)
It was good to see you today. Test(s) ordered today. Your results will be called to you after review (48-72hours after test completion). If any changes need to be made, you will be notified at that time. we'll make referral to gynecology for your bladder incontinence. Our office will contact you regarding appointment(s) once made. Continue MiraLAX everyday to help treat constipation - no other medication changes recommended If you develop worsening symptoms or fever, call and we can reconsider antibiotics, but it does not appear necessary to use antibiotics at this time.

## 2011-06-25 NOTE — Progress Notes (Signed)
Subjective:    Patient ID: Tamara Robertson, female    DOB: 10-Dec-1929, 78 y.o.   MRN: [id number]  HPI  complains of URI - head cold symptoms - onset 48 hours ago, no fever, no sputum. Associated with mild headache, sneezing and clear runny nose  Also BRBPR x 3 events in past 4 weeks - scant blood mixed with stool during BM -  No rectal pain, no abdominal cramping - no melena  Past Medical History  Diagnosis Date  . OSTEOARTHRITIS, KNEE, RIGHT   . URINARY INCONTINENCE   . Irritable bowel syndrome   . DIVERTICULITIS, HX OF   . OSTEOPENIA   . Immune thrombocytopenic purpura     chronic 80-100K  . UNSPECIFIED PERIPHERAL VASCULAR DISEASE   . OVERACTIVE BLADDER   . DYSLIPIDEMIA   . Glaucoma   . GERD   . Hx of colonoscopy   . HOH (hard of hearing)   . CAD (coronary artery disease)     RCA 40% stenosis cath 01/2011     Review of Systems  Constitutional: Positive for fatigue. Negative for chills.  Genitourinary: Negative for dysuria and hematuria.  Musculoskeletal: Negative for back pain and joint swelling.  Skin:       Slight increase ease of bruising       Objective:   Physical Exam BP 142/70  Pulse 75  Temp(Src) 98.2 F (36.8 C) (Oral)  SpO2 96% Constitutional: She appears well-developed and well-nourished. No distress.  HENT: Head: Normocephalic and atraumatic. Ears: B TMs ok, no erythema or effusion; Nose: Nose slightly reddened with clear thin discharge, Mouth/Throat: Oropharynx is moderately erythematous without exudate.  Eyes: Conjunctivae and EOM are normal. Pupils are equal, round, and reactive to light. No scleral icterus.  no conjunctivitis Neck: Normal range of motion. Neck supple. No JVD present. No thyromegaly present.  Cardiovascular: Normal rate, regular rhythm and normal heart sounds.  No murmur heard. No BLE edema. Pulmonary/Chest: Effort normal and breath sounds normal. No respiratory distress. She has no wheezes.  Abdominal: Soft. Bowel sounds are normal.  She exhibits no distension. There is no tenderness. no masses Rectal: Deferred  Skin: Skin is warm and dry. No rash noted. No erythema.  Psychiatric: She has a normal mood and affect. Her behavior is normal. Judgment and thought content normal.   Lab Results  Component Value Date   WBC 10.6* 02/16/2011   HGB 13.2 02/16/2011   HCT 41.3 02/16/2011   PLT 101.0* 02/16/2011   GLUCOSE 100* 02/16/2011   CHOL 172 04/02/2011   TRIG 115.0 04/02/2011   HDL 60.70 04/02/2011   LDLDIRECT 138.1 01/23/2011   LDLCALC 88 04/02/2011   ALT 19 04/03/2011   AST 29 04/03/2011   NA 142 02/16/2011   K 4.4 02/16/2011   CL 105 02/16/2011   CREATININE 0.8 02/16/2011   BUN 15 02/16/2011   CO2 31 02/16/2011   TSH 1.40 01/23/2011   INR 1.1* 02/16/2011        Assessment & Plan:  Viral URI with cough - reassurance provided, no indication for antibiotics, symptomatic care suggested, patient call sooner if symptoms worse or unimproved after 7-10 days  Urinary incontinence, mixed - intolerant of oral medications due to adverse side effects or cost, refer to gynecology for evaluation of same  Blood in stool - bright red blood on the surface of stool associated with bowel movement and chronic constipation - no melena, hematemesis stable. Rectal exam today declined. Check CBC to monitor for change in platelets  given chronic ITP  -suggested resumed MiraLAX daily to treat constipation

## 2011-06-28 ENCOUNTER — Telehealth: Payer: Self-pay

## 2011-06-28 MED ORDER — PREDNISONE (PAK) 10 MG PO TABS: ORAL_TABLET | ORAL | Status: DC

## 2011-06-28 NOTE — Telephone Encounter (Signed)
Pt called requesting treatment of low platelet count. Per lab, MD advised no change in treatment but pt is requesting advisement?

## 2011-06-28 NOTE — Telephone Encounter (Signed)
Pt called requesting treatment of low platelet count. Per lab, MD advised no change in treatment but pt is requesting advisement?   

## 2011-06-28 NOTE — Telephone Encounter (Signed)
Per VAL, verbal given for Prednisone taper pack. Pt informed.

## 2011-07-12 ENCOUNTER — Other Ambulatory Visit: Payer: Medicare HMO

## 2011-07-12 ENCOUNTER — Other Ambulatory Visit: Payer: Self-pay | Admitting: *Deleted

## 2011-07-12 DIAGNOSIS — E7800 Pure hypercholesterolemia, unspecified: Secondary | ICD-10-CM

## 2011-07-13 ENCOUNTER — Other Ambulatory Visit: Payer: Medicare HMO

## 2011-07-13 ENCOUNTER — Other Ambulatory Visit (INDEPENDENT_AMBULATORY_CARE_PROVIDER_SITE_OTHER): Payer: Medicare HMO

## 2011-07-13 DIAGNOSIS — E7800 Pure hypercholesterolemia, unspecified: Secondary | ICD-10-CM

## 2011-07-13 DIAGNOSIS — D693 Immune thrombocytopenic purpura: Secondary | ICD-10-CM

## 2011-07-13 DIAGNOSIS — K921 Melena: Secondary | ICD-10-CM

## 2011-07-13 LAB — CBC WITH DIFFERENTIAL/PLATELET
Basophils Absolute: 0 10*3/uL (ref 0.0–0.1)
Basophils Relative: 0.3 % (ref 0.0–3.0)
Eosinophils Absolute: 0.2 10*3/uL (ref 0.0–0.7)
Eosinophils Relative: 1.7 % (ref 0.0–5.0)
HCT: 41.3 % (ref 36.0–46.0)
Hemoglobin: 13.6 g/dL (ref 12.0–15.0)
Lymphocytes Relative: 33.1 % (ref 12.0–46.0)
Lymphs Abs: 3.7 10*3/uL (ref 0.7–4.0)
MCHC: 32.9 g/dL (ref 30.0–36.0)
MCV: 86.6 fl (ref 78.0–100.0)
Monocytes Absolute: 1.4 10*3/uL — ABNORMAL HIGH (ref 0.1–1.0)
Monocytes Relative: 12.4 % — ABNORMAL HIGH (ref 3.0–12.0)
Neutro Abs: 5.8 10*3/uL (ref 1.4–7.7)
Neutrophils Relative %: 52.5 % (ref 43.0–77.0)
Platelets: 192 10*3/uL (ref 150.0–400.0)
RBC: 4.77 Mil/uL (ref 3.87–5.11)
RDW: 13.1 % (ref 11.5–14.6)
WBC: 11.1 10*3/uL — ABNORMAL HIGH (ref 4.5–10.5)

## 2011-07-13 LAB — LIPID PANEL
Cholesterol: 188 mg/dL (ref 0–200)
HDL: 66 mg/dL
LDL Cholesterol: 99 mg/dL (ref 0–99)
Total CHOL/HDL Ratio: 3
Triglycerides: 116 mg/dL (ref 0.0–149.0)
VLDL: 23.2 mg/dL (ref 0.0–40.0)

## 2011-07-19 ENCOUNTER — Telehealth: Payer: Self-pay | Admitting: Cardiology

## 2011-07-19 NOTE — Telephone Encounter (Signed)
New Msg: Pt calling wanting to speak with nurse to find out result of pt recent lab work. Please return pt call to discuss further.

## 2011-07-19 NOTE — Telephone Encounter (Signed)
Results have not been reviewed, gave pt results and she understands these are without Dr recommendations on them, pt understands and would like a copy mailed to her once he advises.

## 2011-07-20 NOTE — Telephone Encounter (Signed)
Results mailed to pts home address

## 2011-07-30 ENCOUNTER — Telehealth: Payer: Self-pay | Admitting: *Deleted

## 2011-07-30 DIAGNOSIS — D693 Immune thrombocytopenic purpura: Secondary | ICD-10-CM

## 2011-07-30 NOTE — Telephone Encounter (Signed)
Called pt spoke with husband gave md response... 07/30/11@2 :16pm/LMB

## 2011-07-30 NOTE — Telephone Encounter (Signed)
Order for refer done - i am happy to refer to heme as requested

## 2011-07-30 NOTE — Telephone Encounter (Signed)
Left msg on vm md was going to refer her to see hematologist. Was told by front office no referral has been place. Pt requesting referral ASAP....07/30/11@10 :35am/LMB

## 2011-08-02 ENCOUNTER — Telehealth: Payer: Self-pay | Admitting: Oncology

## 2011-08-02 NOTE — Telephone Encounter (Signed)
S/w the pt and she is aware of her new pt appt with dr Clelia Croft in april

## 2011-08-15 ENCOUNTER — Ambulatory Visit: Payer: Medicare HMO | Admitting: Oncology

## 2011-08-15 ENCOUNTER — Ambulatory Visit: Payer: Medicare HMO

## 2011-08-15 ENCOUNTER — Other Ambulatory Visit: Payer: Medicare HMO | Admitting: Lab

## 2011-08-15 ENCOUNTER — Telehealth: Payer: Self-pay | Admitting: Oncology

## 2011-08-15 NOTE — Telephone Encounter (Signed)
Referred by Dr. Rene Paci Dx- ITP

## 2011-08-16 ENCOUNTER — Ambulatory Visit: Payer: Medicare HMO

## 2011-08-16 ENCOUNTER — Other Ambulatory Visit (HOSPITAL_BASED_OUTPATIENT_CLINIC_OR_DEPARTMENT_OTHER): Payer: Medicare HMO

## 2011-08-16 ENCOUNTER — Ambulatory Visit (HOSPITAL_BASED_OUTPATIENT_CLINIC_OR_DEPARTMENT_OTHER): Payer: Medicare HMO | Admitting: Oncology

## 2011-08-16 VITALS — BP 154/70 | HR 78 | Temp 96.9°F | Ht 62.5 in | Wt 149.5 lb

## 2011-08-16 DIAGNOSIS — D693 Immune thrombocytopenic purpura: Secondary | ICD-10-CM

## 2011-08-16 DIAGNOSIS — IMO0002 Reserved for concepts with insufficient information to code with codable children: Secondary | ICD-10-CM

## 2011-08-16 DIAGNOSIS — Z9089 Acquired absence of other organs: Secondary | ICD-10-CM

## 2011-08-16 LAB — COMPREHENSIVE METABOLIC PANEL
ALT: 13 U/L (ref 0–35)
AST: 23 U/L (ref 0–37)
Albumin: 4 g/dL (ref 3.5–5.2)
BUN: 20 mg/dL (ref 6–23)
Calcium: 9.5 mg/dL (ref 8.4–10.5)
Chloride: 105 mEq/L (ref 96–112)
Potassium: 4 mEq/L (ref 3.5–5.3)
Sodium: 141 mEq/L (ref 135–145)
Total Protein: 6.6 g/dL (ref 6.0–8.3)

## 2011-08-16 LAB — CBC WITH DIFFERENTIAL/PLATELET
BASO%: 0.4 % (ref 0.0–2.0)
HCT: 39 % (ref 34.8–46.6)
MCHC: 33.6 g/dL (ref 31.5–36.0)
MONO#: 0.9 10*3/uL (ref 0.1–0.9)
NEUT%: 56 % (ref 38.4–76.8)
WBC: 11.4 10*3/uL — ABNORMAL HIGH (ref 3.9–10.3)
lymph#: 3.9 10*3/uL — ABNORMAL HIGH (ref 0.9–3.3)
nRBC: 0 % (ref 0–0)

## 2011-08-16 NOTE — Progress Notes (Signed)
Reason for Referral: History of ITP    HPI: 78 year old native of the state [place]. She lived in multiple locations most recently moved from [place] to [place] in the last 18 months or so. Patient has a history if ITP dating back to to 49s after she gave birth to her child. She was found to have platelet count 8000. She was treated with a splenectomy and subsequently treated with high doses of steroids. The patient have had a complete response to steroids back in the 60s and all the way have had a few relapses. Every time she has a relapse she gets restarted on high-dose of steroids and she achieved a complete response. The most recent of relapses before her move to [facility] she was hospitalized for a platelet count of 8000 around the year 2000. After a complete remission she was tapered down her prednisone to 2.5 mg every other day.  Since her move to [facility] she was established with a primary care physician Dr. Felicity Coyer.    In February of this year she developed upper respiratory infection and developed thrombocytopenia with platelet counts in the 50s. She was treated subsequently with Medrol Dosepak and had a complete response with a platelet count of 162 K.  She has been back on her maintenance doses of prednisone of 2.5 mg every other day for the last 6 weeks or so. She is completely asymptomatic at this point. She she is not reporting any bleeding, easy bruisability, or any skin changes. She has not reported any hematochezia or melena she has not reported any epistaxis or hemoptysis.  Past Medical History  Diagnosis Date  . OSTEOARTHRITIS, KNEE, RIGHT   . URINARY INCONTINENCE   . Irritable bowel syndrome   . DIVERTICULITIS, HX OF   . OSTEOPENIA   . Immune thrombocytopenic purpura     chronic 80-100K  . UNSPECIFIED PERIPHERAL VASCULAR DISEASE   . OVERACTIVE BLADDER   . DYSLIPIDEMIA   . Glaucoma   . GERD   . Hx of colonoscopy   . HOH (hard of hearing)   . CAD  (coronary artery disease)     RCA 40% stenosis cath 01/2011  :  Past Surgical History  Procedure Date  . Splenectomy 1954  . Abdominal hysterectomy 1963  . L pop pta 10/2009  . Colectomy, simoid 10/2005  . Cataract extraction, bilateral 10/2010  :  Current outpatient prescriptions:amoxicillin (AMOXIL) 500 MG capsule, Take 2,000 mg by mouth as needed. Use for dental procedures, Disp: , Rfl: ;  aspirin 325 MG tablet, Take 325 mg by mouth once.  , Disp: , Rfl: ;  bimatoprost (LUMIGAN) 0.01 % SOLN, Place 1 drop into both eyes daily.  , Disp: , Rfl: ;  Biotin (APPEAREX) 2.5 MG TABS, Take by mouth daily.  , Disp: , Rfl:  Calcium-Magnesium-Vitamin D (CITRACAL CALCIUM+D) 600-40-500 MG-MG-UNIT TB24, Take 1 tablet by mouth daily. , Disp: , Rfl: ;  docusate sodium (COLACE) 100 MG capsule, Take 100 mg by mouth daily. , Disp: , Rfl: ;  estrogens, conjugated, (PREMARIN) 0.3 MG tablet, Take 0.3 mg by mouth every 3 (three) days. Take daily for 21 days then do not take for 7 days., Disp: , Rfl:  Flaxseed, Linseed, (FLAXSEED OIL) 1000 MG CAPS, Take 1 capsule by mouth daily. , Disp: , Rfl: ;  Glucosamine-Chondroitin (GLUCOSAMINE CHONDR COMPLEX PO), Take 1 tablet by mouth daily.  , Disp: , Rfl: ;  Magnesium 500 MG TABS, Take 1  tablet by mouth daily. , Disp: , Rfl: ;  Multiple Vitamin (MULTIVITAMIN) tablet, Take 1 tablet by mouth daily.  , Disp: , Rfl:  pravastatin (PRAVACHOL) 40 MG tablet, Take 1 tablet (40 mg total) by mouth every evening., Disp: 90 tablet, Rfl: 3;  predniSONE (DELTASONE) 2.5 MG tablet, Take 2.5 mg by mouth every other day. , Disp: , Rfl: ;  predniSONE (STERAPRED UNI-PAK) 10 MG tablet, As directed for 6 days, Disp: 21 tablet, Rfl: 0;  Probiotic Product (PROBIOTIC FORMULA PO), Take 1 tablet by mouth daily.  , Disp: , Rfl:  ranitidine (ZANTAC) 150 MG tablet, Take 150 mg by mouth 2 (two) times daily.  , Disp: , Rfl: ;  vitamin C (ASCORBIC ACID) 500 MG tablet, Take 500 mg by mouth daily.  , Disp: , Rfl: :     :  Allergies  Allergen Reactions  . Aspirin Other (See Comments)    ITP  :  Family History  Problem Relation Age of Onset  . Coronary artery disease Mother   . Heart attack Mother 29  . Hyperlipidemia Mother   . Hypertension Mother   . Stomach cancer Father   . Hypertension Daughter   . Hyperlipidemia Daughter   . Arthritis      parent  . Transient ischemic attack      parent  :  History   Social History  . Marital Status: Married    Spouse Name: N/A    Number of Children: N/A  . Years of Education: N/A   Occupational History  . Retired    Social History Main Topics  . Smoking status: Never Smoker   . 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 L[place] Ham to [place] from [place] 05/2010 to be close to kids  . Alcohol Use: Yes     rarely  . Drug Use: No  . Sexually Active: Not on file   Other Topics Concern  . Not on file   Social History Narrative  . No narrative on file  :  A comprehensive review of systems was negative.  Exam: Vitals noted in the chart.  General appearance: alert, cooperative and appears stated age Head: Normocephalic, without obvious abnormality, atraumatic Eyes: conjunctivae/corneas clear. PERRL, EOM's intact. Fundi benign. Ears: normal TM's and external ear canals both ears Nose: Nares normal. Septum midline. Mucosa normal. No drainage or sinus tenderness. Throat: lips, mucosa, and tongue normal; teeth and gums normal Neck: no adenopathy, no carotid bruit, no JVD, supple, symmetrical, trachea midline and thyroid not enlarged, symmetric, no tenderness/mass/nodules Resp: clear to auscultation bilaterally Chest wall: no tenderness Cardio: regular rate and rhythm, S1, S2 normal, no murmur, click, rub or gallop GI: soft, non-tender; bowel sounds normal; no masses,  no organomegaly Extremities: extremities normal, atraumatic, no cyanosis or edema Skin: Skin color, texture, turgor  normal. No rashes or lesions Lungs are clear.   Basename 08/16/11 1343  WBC 11.4*  HGB 13.1  HCT 39.0  PLT 162   No results found for this basename: NA:2,K:2,CL:2,CO2:2,GLUCOSE:2,BUN:2,CREATININE:2,CALCIUM:2 in the last 72 hours Lab Results  Component Value Date   WBC 11.4* 08/16/2011   HGB 13.1 08/16/2011   HCT 39.0 08/16/2011   MCV 85.0 08/16/2011   PLT 162 08/16/2011   No results found for this basename: NA,K,CL,CO2,BUN,CREATININE,CALCIUM,LABALBU,PROT,BILITOT,ALKPHOS,ALT,AST,GLUCOSE in the last 168 hours   Blood smear review: Normal.     Assessment and Plan:  78 year old female with the following issues:  1.Chronic  relapsing ITP: She had multiple relapses in the past that is highly responsive to prednisone. She is currently on low-dose maintenance which seems to be effective in the limiting her relapses. It seems that higher doses of steroids such as of the Medrol Dosepak is more than adequate to her back in to remission quite easily. This is evident by the fact that her platelet counts are 162,000 today after they have dropped down to 50,000 in February of 2013. For the time being I will keep her on low-dose of steroids as this seems to be well tolerated at this time. She does appear to have significant mineral bone loss or any evidence to suggest diabetes. I will continue to follow her blood counts every 6 months certainly sooner if she develops any signs of symptoms of bleeding or easy bruisability. And should she relapse, will treat her for a short course of high dose of steroids close to 60 mg daily for a short period of time.  2. Followup: Will be in 6 months time sooner if needed to.

## 2011-08-29 ENCOUNTER — Ambulatory Visit: Payer: Medicare HMO | Admitting: Internal Medicine

## 2011-08-30 ENCOUNTER — Encounter: Payer: Self-pay | Admitting: Internal Medicine

## 2011-08-30 ENCOUNTER — Ambulatory Visit (INDEPENDENT_AMBULATORY_CARE_PROVIDER_SITE_OTHER): Payer: Medicare HMO | Admitting: Internal Medicine

## 2011-08-30 VITALS — BP 140/70 | HR 82 | Temp 97.4°F | Ht 62.0 in | Wt 150.8 lb

## 2011-08-30 DIAGNOSIS — D693 Immune thrombocytopenic purpura: Secondary | ICD-10-CM

## 2011-08-30 DIAGNOSIS — R32 Unspecified urinary incontinence: Secondary | ICD-10-CM

## 2011-08-30 DIAGNOSIS — G4700 Insomnia, unspecified: Secondary | ICD-10-CM

## 2011-08-30 DIAGNOSIS — E785 Hyperlipidemia, unspecified: Secondary | ICD-10-CM

## 2011-08-30 MED ORDER — ZOLPIDEM TARTRATE 10 MG PO TABS: 5.0000 mg | ORAL_TABLET | Freq: Every evening | ORAL | Status: DC | PRN

## 2011-08-30 NOTE — Assessment & Plan Note (Signed)
On prava The current medical regimen is effective;  continue present plan and medications.

## 2011-08-30 NOTE — Progress Notes (Signed)
Subjective:    Patient ID: Tamara Robertson, female    DOB: 03/08/1930, 78 y.o.   MRN: [id number]  HPI  Here for follow up - feels well  Past Medical History  Diagnosis Date  . OSTEOARTHRITIS, KNEE, RIGHT   . URINARY INCONTINENCE   . Irritable bowel syndrome   . DIVERTICULITIS, HX OF   . OSTEOPENIA   . Immune thrombocytopenic purpura     chronic - baseline 80-100K, on pred  . UNSPECIFIED PERIPHERAL VASCULAR DISEASE   . OVERACTIVE BLADDER   . DYSLIPIDEMIA   . Glaucoma   . GERD   . Hx of colonoscopy   . HOH (hard of hearing)   . CAD (coronary artery disease)     RCA 40% stenosis cath 01/2011    Review of Systems  Constitutional: Negative for fever, fatigue and unexpected weight change.  Respiratory: Negative for cough and shortness of breath.   Cardiovascular: Negative for chest pain and palpitations.  Genitourinary: Negative for dysuria and hematuria.       Objective:   Physical Exam  BP 140/70  Pulse 82  Temp(Src) 97.4 F (36.3 C) (Oral)  Ht 5\' 2"  (1.575 m)  Wt 150 lb 13.6 oz (68.425 kg)  BMI 27.59 kg/m2  SpO2 96% Constitutional: She appears well-developed and well-nourished. No distress Neck: Normal range of motion. Neck supple. No JVD present. No thyromegaly present.  Cardiovascular: Normal rate, regular rhythm and normal heart sounds.  No murmur heard. No BLE edema. Pulmonary/Chest: Effort normal and breath sounds normal. No respiratory distress. She has no wheezes.  Skin: no petichea Skin is warm and dry. No rash noted. No erythema.  Psychiatric: She has a normal mood and affect. Her behavior is normal. Judgment and thought content normal.   Lab Results  Component Value Date   WBC 11.4* 08/16/2011   HGB 13.1 08/16/2011   HCT 39.0 08/16/2011   PLT 162 08/16/2011   GLUCOSE 158* 08/16/2011   CHOL 188 07/13/2011   TRIG 116.0 07/13/2011   HDL 66.00 07/13/2011   LDLDIRECT 138.1 01/23/2011   LDLCALC 99 07/13/2011   ALT 13 08/16/2011   AST 23 08/16/2011   NA 141 08/16/2011   K  4.0 08/16/2011   CL 105 08/16/2011   CREATININE 0.84 08/16/2011   BUN 20 08/16/2011   CO2 27 08/16/2011   TSH 1.40 01/23/2011   INR 1.1* 02/16/2011        Assessment & Plan:  See problem list. Medications and labs reviewed today.  Chronic insomnia - refill on prn Ambein

## 2011-08-30 NOTE — Patient Instructions (Addendum)
It was good to see you today. Medications reviewed, no changes at this time. Ambien to use as needed for sleep - Your prescription(s) have been submitted to your pharmacy. Please take as directed and contact our office if you believe you are having problem(s) with the medication(s). CBC in 3 months ordered today (lab only). Your results will be called to you after review (48-72hours after test completion). If any changes need to be made, you will be notified at that time. Please schedule followup in 6 months, call sooner if problems.

## 2011-08-30 NOTE — Assessment & Plan Note (Signed)
Flare 06/2011 precipitated by URI S/p heme eval 07/2011 for same S/p dose pak with resolution - on low dose maintained pred now The current medical regimen is effective;  continue present plan and medications.

## 2011-08-30 NOTE — Assessment & Plan Note (Signed)
rx ambien prn provided

## 2011-08-30 NOTE — Assessment & Plan Note (Signed)
  Urinary incontinence, mixed - intolerant of oral medications due to adverse side effects or cost S/p 08/2011 eval by gynecology  - no new recommendations

## 2011-09-25 ENCOUNTER — Other Ambulatory Visit: Payer: Self-pay

## 2011-09-25 ENCOUNTER — Telehealth: Payer: Self-pay

## 2011-09-25 MED ORDER — PREDNISONE 2.5 MG PO TABS: 2.5000 mg | ORAL_TABLET | Freq: Every day | ORAL | Status: DC

## 2011-09-25 NOTE — Telephone Encounter (Signed)
Yes - ok to change to qd  yes, ok to refill thanks

## 2011-09-25 NOTE — Telephone Encounter (Signed)
Pt advised of Rx refill and pharmacy.

## 2011-09-25 NOTE — Telephone Encounter (Signed)
Pt called requesting refill of Prednisone to [facility] order sig, 1 po qd. Per EPIC, pt directions are every other day. Okay to change per pt request and refill?

## 2011-11-29 ENCOUNTER — Other Ambulatory Visit (INDEPENDENT_AMBULATORY_CARE_PROVIDER_SITE_OTHER): Payer: Medicare HMO

## 2011-11-29 DIAGNOSIS — D693 Immune thrombocytopenic purpura: Secondary | ICD-10-CM

## 2011-11-29 LAB — CBC WITH DIFFERENTIAL/PLATELET
Eosinophils Absolute: 0.2 10*3/uL (ref 0.0–0.7)
Eosinophils Relative: 1.7 % (ref 0.0–5.0)
Lymphocytes Relative: 27.6 % (ref 12.0–46.0)
MCHC: 32.5 g/dL (ref 30.0–36.0)
MCV: 87.9 fl (ref 78.0–100.0)
Monocytes Absolute: 1 10*3/uL (ref 0.1–1.0)
Neutrophils Relative %: 61.9 % (ref 43.0–77.0)
Platelets: 99 10*3/uL — ABNORMAL LOW (ref 150.0–400.0)
WBC: 12 10*3/uL — ABNORMAL HIGH (ref 4.5–10.5)

## 2011-11-30 ENCOUNTER — Telehealth: Payer: Self-pay | Admitting: *Deleted

## 2011-11-30 ENCOUNTER — Telehealth: Payer: Self-pay | Admitting: Internal Medicine

## 2011-11-30 NOTE — Telephone Encounter (Signed)
Patient returned call to nurse who called her this AM. See where christy did try to call patient but did not know what message to tell patient concerning lab results. Please advise

## 2011-11-30 NOTE — Telephone Encounter (Signed)
Patient aware of lab results.

## 2011-11-30 NOTE — Telephone Encounter (Signed)
Labs ok - platelet count is 99 No tx changes recommended

## 2011-11-30 NOTE — Telephone Encounter (Signed)
Pt calling back about lab results. Per note by Dr Felicity Coyer, platelet count at 99 and no change in tx plan needed. Ms Fambrough aware and is relieved, will f/u as needed.

## 2012-01-09 ENCOUNTER — Ambulatory Visit (INDEPENDENT_AMBULATORY_CARE_PROVIDER_SITE_OTHER): Payer: Medicare HMO | Admitting: Internal Medicine

## 2012-01-09 ENCOUNTER — Other Ambulatory Visit (INDEPENDENT_AMBULATORY_CARE_PROVIDER_SITE_OTHER): Payer: Medicare HMO

## 2012-01-09 ENCOUNTER — Encounter: Payer: Self-pay | Admitting: Internal Medicine

## 2012-01-09 VITALS — BP 130/70 | HR 67 | Temp 97.0°F | Ht 62.0 in | Wt 149.4 lb

## 2012-01-09 DIAGNOSIS — K219 Gastro-esophageal reflux disease without esophagitis: Secondary | ICD-10-CM

## 2012-01-09 DIAGNOSIS — D693 Immune thrombocytopenic purpura: Secondary | ICD-10-CM

## 2012-01-09 LAB — CBC WITH DIFFERENTIAL/PLATELET
Basophils Absolute: 0.1 10*3/uL (ref 0.0–0.1)
Basophils Relative: 0.5 % (ref 0.0–3.0)
Eosinophils Absolute: 0.2 10*3/uL (ref 0.0–0.7)
Lymphocytes Relative: 31.7 % (ref 12.0–46.0)
MCHC: 32 g/dL (ref 30.0–36.0)
Monocytes Relative: 12.2 % — ABNORMAL HIGH (ref 3.0–12.0)
Neutrophils Relative %: 54.4 % (ref 43.0–77.0)
RBC: 4.93 Mil/uL (ref 3.87–5.11)
RDW: 13.6 % (ref 11.5–14.6)

## 2012-01-09 MED ORDER — PANTOPRAZOLE SODIUM 40 MG PO TBEC: 40.0000 mg | DELAYED_RELEASE_TABLET | Freq: Every day | ORAL | Status: DC

## 2012-01-09 NOTE — Patient Instructions (Signed)
It was good to see you today. Test(s) ordered today. Your results will be called to you after review (48-72hours after test completion). If any changes need to be made, you will be notified at that time. Stop Zantac, use Protonix once daily instead for next 30 days - Your prescription(s) have been submitted to your pharmacy. Please take as directed and contact our office if you believe you are having problem(s) with the medication(s). If you reflux is unimproved in next 30 days, we will refer you to specialist as needed Gastroesophageal Reflux Disease, Adult Gastroesophageal reflux disease (GERD) happens when acid from your stomach goes into your food pipe (esophagus). The acid can cause a burning feeling in your chest. Over time, the acid can make small holes (ulcers) in your food pipe.   HOME CARE  Ask your doctor for advice about:   Losing weight.   Quitting smoking.   Alcohol use.   Avoid foods and drinks that make your problems worse. You may want to avoid:   Caffeine and alcohol.   Chocolate.   Mints.   Garlic and onions.   Spicy foods.   Citrus fruits, such as oranges, lemons, or limes.   Foods that contain tomato, such as sauce, chili, salsa, and pizza.   Fried and fatty foods.   Avoid lying down for 3 hours before you go to bed or before you take a nap.   Eat small meals often, instead of large meals.   Wear loose-fitting clothing. Do not wear anything tight around your waist.   Raise (elevate) the head of your bed 6 to 8 inches with wood blocks. Using extra pillows does not help.   Only take medicines as told by your doctor.   Do not take aspirin or ibuprofen.  GET HELP RIGHT AWAY IF:    You have pain in your arms, neck, jaw, teeth, or back.   Your pain gets worse or changes.   You feel sick to your stomach (nauseous), throw up (vomit), or sweat (diaphoresis).   You feel short of breath, or you pass out (faint).   Your throw up is green, yellow, black,  or looks like coffee grounds or blood.   Your poop (stool) is red, bloody, or black.  MAKE SURE YOU:    Understand these instructions.   Will watch your condition.   Will get help right away if you are not doing well or get worse.  Document Released: 10/10/2007 Document Revised: 04/12/2011 Document Reviewed: 11/10/2010 [facility] Patient Information 2012 [place], [place].

## 2012-01-09 NOTE — Assessment & Plan Note (Signed)
Flare 06/2011 precipitated by URI S/p heme eval 07/2011 for same S/p dose pak with resolution - on low dose maintainence pred chronically The current medical regimen is effective;  continue present plan and medications.   

## 2012-01-09 NOTE — Assessment & Plan Note (Signed)
Increase symptoms in past 7 days, no red flags on hx and exam benign Chronic max dose H2B, no prior EGD but mod-high risk due to chronic pred use Change H2B to PPI x 30 days - pt to call if unimproved, sooner if worse Advised on diet changes and life style modifications Check CBC now - see next

## 2012-01-09 NOTE — Progress Notes (Signed)
Subjective:    Patient ID: Tamara Robertson, female    DOB: 26-Jun-1929, 78 y.o.   MRN: [id number]  Gastrophageal Reflux She complains of abdominal pain, heartburn and water brash. She reports no chest pain, no choking, no coughing, no dysphagia, no globus sensation, no hoarse voice, no nausea or no sore throat. This is a new problem. The current episode started in the past 7 days. The problem occurs frequently. The problem has been unchanged. The heartburn duration is an hour. The heartburn is located in the substernum. The heartburn is of moderate intensity. The heartburn does not wake her from sleep. The heartburn does not limit her activity. The heartburn doesn't change with position. The symptoms are aggravated by certain foods and stress. Pertinent negatives include no anemia, fatigue, melena or weight loss. She has tried a histamine-2 antagonist (ginger) for the symptoms. The treatment provided no relief. Past procedures do not include an EGD.    Past Medical History  Diagnosis Date  . OSTEOARTHRITIS, KNEE, RIGHT   . URINARY INCONTINENCE   . Irritable bowel syndrome   . DIVERTICULITIS, HX OF   . OSTEOPENIA   . Immune thrombocytopenic purpura     chronic - baseline 80-100K, on pred  . UNSPECIFIED PERIPHERAL VASCULAR DISEASE   . OVERACTIVE BLADDER   . DYSLIPIDEMIA   . Glaucoma   . GERD   . Hx of colonoscopy   . HOH (hard of hearing)   . CAD (coronary artery disease)     RCA 40% stenosis cath 01/2011    Review of Systems  Constitutional: Negative for fever, weight loss, fatigue and unexpected weight change.  HENT: Negative for sore throat and hoarse voice.   Respiratory: Negative for cough, choking and shortness of breath.   Cardiovascular: Negative for chest pain and palpitations.  Gastrointestinal: Positive for heartburn and abdominal pain. Negative for dysphagia, nausea and melena.  Genitourinary: Negative for dysuria and hematuria.       Objective:   Physical Exam  BP 130/70   Pulse 67  Temp 97 F (36.1 C) (Oral)  Ht 5\' 2"  (1.575 m)  Wt 149 lb 6.4 oz (67.767 kg)  BMI 27.33 kg/m2  SpO2 97% Wt Readings from Last 3 Encounters:  01/09/12 149 lb 6.4 oz (67.767 kg)  08/30/11 150 lb 13.6 oz (68.425 kg)  08/16/11 149 lb 8 oz (67.813 kg)   Constitutional: She appears well-developed and well-nourished. No distress Neck: Normal range of motion. Neck supple. No JVD present. No thyromegaly present.  Cardiovascular: Normal rate, regular rhythm and normal heart sounds.  No murmur heard. No BLE edema. Pulmonary/Chest: Effort normal and breath sounds normal. No respiratory distress. She has no wheezes.  Abdomen: S, NTND, +BS, no mass. No R/G Skin: no petichea Skin is warm and dry. No rash noted. No erythema.  Psychiatric: She has a normal mood and affect. Her behavior is normal. Judgment and thought content normal.   Lab Results  Component Value Date   WBC 12.0 Repeated and verified X2.* 11/29/2011   HGB 13.7 11/29/2011   HCT 42.2 11/29/2011   PLT 99.0 Repeated and verified X2.* 11/29/2011   GLUCOSE 158* 08/16/2011   CHOL 188 07/13/2011   TRIG 116.0 07/13/2011   HDL 66.00 07/13/2011   LDLDIRECT 138.1 01/23/2011   LDLCALC 99 07/13/2011   ALT 13 08/16/2011   AST 23 08/16/2011   NA 141 08/16/2011   K 4.0 08/16/2011   CL 105 08/16/2011   CREATININE 0.84 08/16/2011   BUN 20  08/16/2011   CO2 27 08/16/2011   TSH 1.40 01/23/2011   INR 1.1* 02/16/2011        Assessment & Plan:  See problem list. Medications and labs reviewed today.

## 2012-02-12 ENCOUNTER — Telehealth: Payer: Self-pay | Admitting: Oncology

## 2012-02-12 ENCOUNTER — Ambulatory Visit (HOSPITAL_BASED_OUTPATIENT_CLINIC_OR_DEPARTMENT_OTHER): Payer: Medicare HMO | Admitting: Oncology

## 2012-02-12 ENCOUNTER — Other Ambulatory Visit (HOSPITAL_BASED_OUTPATIENT_CLINIC_OR_DEPARTMENT_OTHER): Payer: Medicare HMO

## 2012-02-12 VITALS — BP 149/73 | HR 72 | Temp 97.0°F | Resp 18 | Ht 62.0 in | Wt 152.0 lb

## 2012-02-12 DIAGNOSIS — D693 Immune thrombocytopenic purpura: Secondary | ICD-10-CM

## 2012-02-12 LAB — COMPREHENSIVE METABOLIC PANEL (CC13)
Albumin: 3.4 g/dL — ABNORMAL LOW (ref 3.5–5.0)
CO2: 24 mEq/L (ref 22–29)
Chloride: 107 mEq/L (ref 98–107)
Glucose: 80 mg/dl (ref 70–99)
Potassium: 4.2 mEq/L (ref 3.5–5.1)
Sodium: 141 mEq/L (ref 136–145)
Total Protein: 6.4 g/dL (ref 6.4–8.3)

## 2012-02-12 LAB — CBC WITH DIFFERENTIAL/PLATELET
Basophils Absolute: 0.1 10*3/uL (ref 0.0–0.1)
Eosinophils Absolute: 0.2 10*3/uL (ref 0.0–0.5)
HGB: 13.5 g/dL (ref 11.6–15.9)
NEUT#: 8.1 10*3/uL — ABNORMAL HIGH (ref 1.5–6.5)
RDW: 14.4 % (ref 11.2–14.5)
lymph#: 2.6 10*3/uL (ref 0.9–3.3)

## 2012-02-12 NOTE — Telephone Encounter (Signed)
appts made and printed for pt aom °

## 2012-02-12 NOTE — Progress Notes (Signed)
Hematology and Oncology Follow Up Visit  Tamara Robertson [id number] [birth year] 78 y.o. 02/12/2012 10:44 AM   Principle Diagnosis: 78 year old with chronic, relapsing ITP diagnosed in the 35s.   Prior Therapy: Patient S/P splenectomy and subsequently treated with high doses of steroids. The patient have had a complete response to steroids back in the 60s and all the way have had a few relapses. Every time she has a relapse she gets restarted on high-dose of steroids and she achieved a complete response. The most recent of relapses before her move to [facility] she was hospitalized for a platelet count of 8000 around the year 2000.  Current therapy: Prednisone to 2.5 mg every other day   Interim History: Tamara Robertson returns today for a follow up visit. Since her last visit, she has been doing well. No new complaints. She is completely asymptomatic at this point. She she is not reporting any bleeding, easy bruisability, or any skin changes. She has not reported any hematochezia or melena she has not reported any epistaxis or hemoptysis.   Medications: I have reviewed the patient's current medications. Current outpatient prescriptions:Biotin (APPEAREX) 2.5 MG TABS, Take by mouth daily.  , Disp: , Rfl: ;  Calcium-Magnesium-Vitamin D (CITRACAL CALCIUM+D) 600-40-500 MG-MG-UNIT TB24, Take 1 tablet by mouth daily. , Disp: , Rfl: ;  docusate sodium (COLACE) 100 MG capsule, Take 100 mg by mouth daily. , Disp: , Rfl: ;  Flaxseed MISC, by Does not apply route. Take 1 tsp daily, Disp: , Rfl:  Glucosamine-Chondroitin (GLUCOSAMINE CHONDR COMPLEX PO), Take 1 tablet by mouth daily.  , Disp: , Rfl: ;  Magnesium 250 MG TABS, Take 250 mg by mouth daily., Disp: , Rfl: ;  Multiple Vitamin (MULTIVITAMIN) tablet, Take 1 tablet by mouth daily.  , Disp: , Rfl: ;  pantoprazole (PROTONIX) 40 MG tablet, Take 1 tablet (40 mg total) by mouth daily., Disp: 30 tablet, Rfl: 3 pravastatin (PRAVACHOL) 40 MG tablet, Take 1 tablet (40 mg  total) by mouth every evening., Disp: 90 tablet, Rfl: 3;  predniSONE (DELTASONE) 2.5 MG tablet, Take 1 tablet (2.5 mg total) by mouth daily., Disp: 90 tablet, Rfl: 1;  Probiotic Product (PROBIOTIC FORMULA PO), Take 1 tablet by mouth daily.  , Disp: , Rfl: ;  ranitidine (ZANTAC) 150 MG tablet, Take 150 mg by mouth 2 (two) times daily., Disp: , Rfl:  vitamin C (ASCORBIC ACID) 500 MG tablet, Take 500 mg by mouth daily.  , Disp: , Rfl: ;  zolpidem (AMBIEN) 10 MG tablet, Take 5-10 mg by mouth at bedtime as needed., Disp: , Rfl:   Allergies:  Allergies  Allergen Reactions  . Aspirin Other (See Comments)    ITP    Past Medical History, Surgical history, Social history, and Family History were reviewed and updated.  Review of Systems: Constitutional:  Negative for fever, chills, night sweats, anorexia, weight loss, pain. Cardiovascular: no chest pain or dyspnea on exertion Respiratory: negative Neurological: negative Dermatological: negative ENT: negative Skin: Negative. Gastrointestinal: negative Genito-Urinary: negative Hematological and Lymphatic: negative Breast: negative Musculoskeletal: negative Remaining ROS negative. Physical Exam: Blood pressure 149/73, pulse 72, temperature 97 F (36.1 C), temperature source Oral, resp. rate 18, height 5\' 2"  (1.575 m), weight 152 lb (68.947 kg). ECOG: 1 General appearance: alert Head: Normocephalic, without obvious abnormality, atraumatic Neck: no adenopathy, no carotid bruit, no JVD, supple, symmetrical, trachea midline and thyroid not enlarged, symmetric, no tenderness/mass/nodules Lymph nodes: Cervical, supraclavicular, and axillary nodes normal. Heart:regular rate and rhythm, S1,  S2 normal, no murmur, click, rub or gallop Lung:chest clear, no wheezing, rales, normal symmetric air entry Abdomin: soft, non-tender, without masses or organomegaly EXT:no erythema, induration, or nodules   Lab Results: Lab Results  Component Value Date    WBC 12.2* 02/12/2012   HGB 13.5 02/12/2012   HCT 40.4 02/12/2012   MCV 87.1 02/12/2012   PLT 87* 02/12/2012     Chemistry      Component Value Date/Time   NA 141 08/16/2011 1343   K 4.0 08/16/2011 1343   CL 105 08/16/2011 1343   CO2 27 08/16/2011 1343   BUN 20 08/16/2011 1343   CREATININE 0.84 08/16/2011 1343      Component Value Date/Time   CALCIUM 9.5 08/16/2011 1343   ALKPHOS 79 08/16/2011 1343   AST 23 08/16/2011 1343   ALT 13 08/16/2011 1343   BILITOT 0.4 08/16/2011 1343      Impression and Plan:   78 year old female with the following issues:  Chronic relapsing ITP: She had multiple relapses in the past that is highly responsive to prednisone. She is currently on low-dose maintenance which seems to be effective in the limiting her relapses. It seems that higher doses of steroids such as of the Medrol Dosepak is more than adequate to her back in to remission quite easily.  Her counts are lower today, but still more than adequate at this time. She is not at risk of bleeding at this time. For the time being I will keep her on low-dose of steroids as this seems to be well tolerated at this time. She does not appear to have significant mineral bone loss or any evidence to suggest diabetes.   I will recheck her counts in 2 months, if her platelets are stable then we will continue to monitor it every 6 months. If she has further drop below 50 K, I will treat her with high dose steroids at that time.         Antjuan Rothe, MD 10/8/201310:44 AM

## 2012-02-25 ENCOUNTER — Other Ambulatory Visit: Payer: Self-pay | Admitting: Internal Medicine

## 2012-02-25 DIAGNOSIS — Z1231 Encounter for screening mammogram for malignant neoplasm of breast: Secondary | ICD-10-CM

## 2012-02-28 ENCOUNTER — Ambulatory Visit (INDEPENDENT_AMBULATORY_CARE_PROVIDER_SITE_OTHER): Payer: Medicare HMO | Admitting: Internal Medicine

## 2012-02-28 ENCOUNTER — Encounter: Payer: Self-pay | Admitting: Internal Medicine

## 2012-02-28 VITALS — BP 132/70 | HR 71 | Temp 97.0°F | Ht 62.0 in | Wt 153.1 lb

## 2012-02-28 DIAGNOSIS — K219 Gastro-esophageal reflux disease without esophagitis: Secondary | ICD-10-CM

## 2012-02-28 DIAGNOSIS — Z0000 Encounter for general adult medical examination without abnormal findings: Secondary | ICD-10-CM

## 2012-02-28 DIAGNOSIS — L600 Ingrowing nail: Secondary | ICD-10-CM

## 2012-02-28 NOTE — Patient Instructions (Signed)
It was good to see you today. We have reviewed your prior records including labs and tests today Health Maintenance reviewed - all recommended immunizations and age-appropriate screenings are up-to-date. we'll make referral to podiatry for your ingrown toenails. Our office will contact you regarding appointment(s) once made. Medications reviewed, no changes at this time. Please schedule followup in 6 months, call sooner if problems.  Health Maintenance, Females A healthy lifestyle and preventative care can promote health and wellness.  Maintain regular health, dental, and eye exams.   Eat a healthy diet. Foods like vegetables, fruits, whole grains, low-fat dairy products, and lean protein foods contain the nutrients you need without too many calories. Decrease your intake of foods high in solid fats, added sugars, and salt. Get information about a proper diet from your caregiver, if necessary.   Regular physical exercise is one of the most important things you can do for your health. Most adults should get at least 150 minutes of moderate-intensity exercise (any activity that increases your heart rate and causes you to sweat) each week. In addition, most adults need muscle-strengthening exercises on 2 or more days a week.     Maintain a healthy weight. The body mass index (BMI) is a screening tool to identify possible weight problems. It provides an estimate of body fat based on height and weight. Your caregiver can help determine your BMI, and can help you achieve or maintain a healthy weight. For adults 20 years and older:   A BMI below 18.5 is considered underweight.   A BMI of 18.5 to 24.9 is normal.   A BMI of 25 to 29.9 is considered overweight.   A BMI of 30 and above is considered obese.   Maintain normal blood lipids and cholesterol by exercising and minimizing your intake of saturated fat. Eat a balanced diet with plenty of fruits and vegetables. Blood tests for lipids and  cholesterol should begin at age 32 and be repeated every 5 years. If your lipid or cholesterol levels are high, you are over 50, or you are a high risk for heart disease, you may need your cholesterol levels checked more frequently. Ongoing high lipid and cholesterol levels should be treated with medicines if diet and exercise are not effective.   If you smoke, find out from your caregiver how to quit. If you do not use tobacco, do not start.   If you are pregnant, do not drink alcohol. If you are breastfeeding, be very cautious about drinking alcohol. If you are not pregnant and choose to drink alcohol, do not exceed 1 drink per day. One drink is considered to be 12 ounces (355 mL) of beer, 5 ounces (148 mL) of wine, or 1.5 ounces (44 mL) of liquor.   Avoid use of street drugs. Do not share needles with anyone. Ask for help if you need support or instructions about stopping the use of drugs.   High blood pressure causes heart disease and increases the risk of stroke. Blood pressure should be checked at least every 1 to 2 years. Ongoing high blood pressure should be treated with medicines, if weight loss and exercise are not effective.   If you are 25 to 76 years old, ask your caregiver if you should take aspirin to prevent strokes.   Diabetes screening involves taking a blood sample to check your fasting blood sugar level. This should be done once every 3 years, after age 8, if you are within normal weight and without risk  factors for diabetes. Testing should be considered at a younger age or be carried out more frequently if you are overweight and have at least 1 risk factor for diabetes.   Breast cancer screening is essential preventative care for women. You should practice "breast self-awareness." This means understanding the normal appearance and feel of your breasts and may include breast self-examination. Any changes detected, no matter how small, should be reported to a caregiver. Women in  their 24s and 30s should have a clinical breast exam (CBE) by a caregiver as part of a regular health exam every 1 to 3 years. After age 66, women should have a CBE every year. Starting at age 28, women should consider having a mammogram (breast X-ray) every year. Women who have a family history of breast cancer should talk to their caregiver about genetic screening. Women at a high risk of breast cancer should talk to their caregiver about having an MRI and a mammogram every year.   The Pap test is a screening test for cervical cancer. Women should have a Pap test starting at age 25. Between ages 48 and 88, Pap tests should be repeated every 2 years. Beginning at age 12, you should have a Pap test every 3 years as long as the past 3 Pap tests have been normal. If you had a hysterectomy for a problem that was not cancer or a condition that could lead to cancer, then you no longer need Pap tests. If you are between ages 37 and 34, and you have had normal Pap tests going back 10 years, you no longer need Pap tests. If you have had past treatment for cervical cancer or a condition that could lead to cancer, you need Pap tests and screening for cancer for at least 20 years after your treatment. If Pap tests have been discontinued, risk factors (such as a new sexual partner) need to be reassessed to determine if screening should be resumed. Some women have medical problems that increase the chance of getting cervical cancer. In these cases, your caregiver may recommend more frequent screening and Pap tests.   The human papillomavirus (HPV) test is an additional test that may be used for cervical cancer screening. The HPV test looks for the virus that can cause the cell changes on the cervix. The cells collected during the Pap test can be tested for HPV. The HPV test could be used to screen women aged 78 years and older, and should be used in women of any age who have unclear Pap test results. After the age of 24,  women should have HPV testing at the same frequency as a Pap test.   Colorectal cancer can be detected and often prevented. Most routine colorectal cancer screening begins at the age of 50 and continues through age 71. However, your caregiver may recommend screening at an earlier age if you have risk factors for colon cancer. On a yearly basis, your caregiver may provide home test kits to check for hidden blood in the stool. Use of a small camera at the end of a tube, to directly examine the colon (sigmoidoscopy or colonoscopy), can detect the earliest forms of colorectal cancer. Talk to your caregiver about this at age 27, when routine screening begins. Direct examination of the colon should be repeated every 5 to 10 years through age 40, unless early forms of pre-cancerous polyps or small growths are found.   Hepatitis C blood testing is recommended for all people born from  1945 through 1965 and any individual with known risks for hepatitis C.   Practice safe sex. Use condoms and avoid high-risk sexual practices to reduce the spread of sexually transmitted infections (STIs). Sexually active women aged 60 and younger should be checked for Chlamydia, which is a common sexually transmitted infection. Older women with new or multiple partners should also be tested for Chlamydia. Testing for other STIs is recommended if you are sexually active and at increased risk.   Osteoporosis is a disease in which the bones lose minerals and strength with aging. This can result in serious bone fractures. The risk of osteoporosis can be identified using a bone density scan. Women ages 66 and over and women at risk for fractures or osteoporosis should discuss screening with their caregivers. Ask your caregiver whether you should be taking a calcium supplement or vitamin D to reduce the rate of osteoporosis.   Menopause can be associated with physical symptoms and risks. Hormone replacement therapy is available to decrease  symptoms and risks. You should talk to your caregiver about whether hormone replacement therapy is right for you.   Use sunscreen with a sun protection factor (SPF) of 30 or greater. Apply sunscreen liberally and repeatedly throughout the day. You should seek shade when your shadow is shorter than you. Protect yourself by wearing long sleeves, pants, a wide-brimmed hat, and sunglasses year round, whenever you are outdoors.   Notify your caregiver of new moles or changes in moles, especially if there is a change in shape or color. Also notify your caregiver if a mole is larger than the size of a pencil eraser.   Stay current with your immunizations.  Document Released: 11/06/2010 Document Revised: 07/16/2011 Document Reviewed: 11/06/2010 [facility] Patient Information 2013 [place], [place].

## 2012-02-28 NOTE — Progress Notes (Signed)
Subjective:    Patient ID: Tamara Robertson, female    DOB: 1929-10-12, 78 y.o.   MRN: [id number]  HPI   Here for medicare wellness  Diet: heart healthy Physical activity: sedentary Depression/mood screen: negative Hearing: intact to whispered voice Visual acuity: grossly normal, performs annual eye exam  ADLs: capable Fall risk: none Home safety: good Cognitive evaluation: intact to orientation, naming, recall and repetition EOL planning: adv directives, full code/ I agree  I have personally reviewed and have noted 1. The patient's medical and social history 2. Their use of alcohol, tobacco or illicit drugs 3. Their current medications and supplements 4. The patient's functional ability including ADL's, fall risks, home safety risks and hearing or visual impairment. 5. Diet and physical activities 6. Evidence for depression or mood disorders   Reviewed chronic medical issues  Past Medical History  Diagnosis Date  . OSTEOARTHRITIS, KNEE, RIGHT   . URINARY INCONTINENCE   . Irritable bowel syndrome   . DIVERTICULITIS, HX OF   . OSTEOPENIA   . Immune thrombocytopenic purpura     chronic - baseline 80-100K, on pred  . UNSPECIFIED PERIPHERAL VASCULAR DISEASE   . OVERACTIVE BLADDER   . DYSLIPIDEMIA   . Glaucoma(365)   . GERD   . Hx of colonoscopy   . HOH (hard of hearing)   . CAD (coronary artery disease)     RCA 40% stenosis cath 01/2011   Family History  Problem Relation Age of Onset  . Coronary artery disease Mother   . Heart attack Mother 47  . Hyperlipidemia Mother   . Hypertension Mother   . Stomach cancer Father   . Hypertension Daughter   . Hyperlipidemia Daughter   . Arthritis      parent  . Transient ischemic attack      parent   History  Substance Use Topics  . Smoking status: Never Smoker   . 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 Linton Ham to [place] from [place] 05/2010 to be close to kids  .  Alcohol Use: Yes     rarely    Review of Systems  Constitutional: Negative for fever, fatigue and unexpected weight change.  Respiratory: Negative for cough and shortness of breath.   Cardiovascular: Negative for chest pain and palpitations.  Genitourinary: Negative for dysuria and hematuria.   Skin: Negative for rash.  Neurological: Negative for dizziness or headache.  No other specific complaints in a complete review of systems (except as listed in HPI above).      Objective:   Physical Exam  BP 132/70  Pulse 71  Temp 97 F (36.1 C) (Oral)  Ht 5\' 2"  (1.575 m)  Wt 153 lb 1.9 oz (69.455 kg)  BMI 28.01 kg/m2  SpO2 95% Wt Readings from Last 3 Encounters:  02/28/12 153 lb 1.9 oz (69.455 kg)  02/12/12 152 lb (68.947 kg)  01/09/12 149 lb 6.4 oz (67.767 kg)   Constitutional: She appears well-developed and well-nourished. No distress Neck: Normal range of motion. Neck supple. No JVD present. No thyromegaly present.  Cardiovascular: Normal rate, regular rhythm and normal heart sounds.  No murmur heard. No BLE edema. Pulmonary/Chest: Effort normal and breath sounds normal. No respiratory distress. She has no wheezes.  Abdomen: protuberant, SNTND, +BS Skin: no petichea Skin is warm and dry. No rash noted. No erythema. B great toenails ingrown without infection Psychiatric: She has a normal mood and affect. Her behavior is normal.  Judgment and thought content normal.   Lab Results  Component Value Date   WBC 12.2* 02/12/2012   HGB 13.5 02/12/2012   HCT 40.4 02/12/2012   PLT 87* 02/12/2012   GLUCOSE 80 02/12/2012   CHOL 188 07/13/2011   TRIG 116.0 07/13/2011   HDL 66.00 07/13/2011   LDLDIRECT 138.1 01/23/2011   LDLCALC 99 07/13/2011   ALT 16 02/12/2012   AST 19 02/12/2012   NA 141 02/12/2012   K 4.2 02/12/2012   CL 107 02/12/2012   CREATININE 0.8 02/12/2012   BUN 16.0 02/12/2012   CO2 24 02/12/2012   TSH 1.40 01/23/2011   INR 1.1* 02/16/2011       Assessment & Plan:  AWV/v70.0 - Today  patient counseled on age appropriate routine health concerns for screening and prevention, each reviewed and up to date or declined. Immunizations reviewed and up to date or declined. Labs/ECG reviewed. Risk factors for depression reviewed and negative. Hearing function and visual acuity are intact. ADLs screened and addressed as needed. Functional ability and level of safety reviewed and appropriate. Education, counseling and referrals performed based on assessed risks today. Patient provided with a copy of personalized plan for preventive services.  Also see problem list. Medications and labs reviewed today.  Ingrown toenails - refer to podiatry

## 2012-02-28 NOTE — Assessment & Plan Note (Signed)
no prior EGD but mod-high risk due to chronic pred use Changed H2B to PPI x 30 days 01/2012 -improved Okay to use alternating H2 blocker or PPI depending on patient preference and symptom relief Advised on diet changes and life style modifications

## 2012-04-08 ENCOUNTER — Ambulatory Visit
Admission: RE | Admit: 2012-04-08 | Discharge: 2012-04-08 | Disposition: A | Discharge status: Home / Self Care | Payer: Medicare HMO | Source: Ambulatory Visit | Attending: Internal Medicine | Admitting: Internal Medicine

## 2012-04-08 DIAGNOSIS — Z1231 Encounter for screening mammogram for malignant neoplasm of breast: Secondary | ICD-10-CM

## 2012-04-11 ENCOUNTER — Ambulatory Visit (INDEPENDENT_AMBULATORY_CARE_PROVIDER_SITE_OTHER): Payer: Medicare HMO | Admitting: Cardiology

## 2012-04-11 ENCOUNTER — Encounter: Payer: Self-pay | Admitting: Cardiology

## 2012-04-11 VITALS — BP 143/74 | HR 77 | Ht 63.0 in | Wt 153.8 lb

## 2012-04-11 DIAGNOSIS — E785 Hyperlipidemia, unspecified: Secondary | ICD-10-CM

## 2012-04-11 DIAGNOSIS — R072 Precordial pain: Secondary | ICD-10-CM

## 2012-04-11 DIAGNOSIS — I739 Peripheral vascular disease, unspecified: Secondary | ICD-10-CM

## 2012-04-11 DIAGNOSIS — R0789 Other chest pain: Secondary | ICD-10-CM

## 2012-04-11 NOTE — Patient Instructions (Addendum)
The current medical regimen is effective;  continue present plan and medications.  Follow up in 6 months with Dr Hochrein.  You will receive a letter in the mail 2 months before you are due.  Please call us when you receive this letter to schedule your follow up appointment.  

## 2012-04-11 NOTE — Progress Notes (Signed)
HPI The patient presents for evaluation of PVD.  Since has had increasing symptoms from back pain. This has limited her. She's not describing any chest pressure, neck or arm discomfort. She has not had any palpitations, presyncope or syncope. She is having no shortness of breath, PND or orthopnea. She has had no weight gain or edema.  Allergies  Allergen Reactions  . Aspirin Other (See Comments)    ITP    Current Outpatient Prescriptions  Medication Sig Dispense Refill  . Biotin 2500 MCG CAPS Take by mouth daily.      . Calcium-Magnesium-Vitamin D (CITRACAL CALCIUM+D) 600-40-500 MG-MG-UNIT TB24 Take 1 tablet by mouth daily.       [name] docusate sodium (COLACE) 100 MG capsule Take 100 mg by mouth daily.       . Flaxseed MISC by Does not apply route. Take 1 tsp daily      . Glucosamine-Chondroitin (GLUCOSAMINE CHONDR COMPLEX PO) Take 1 tablet by mouth daily.        . Magnesium 250 MG TABS Take 250 mg by mouth daily.      . Multiple Vitamin (MULTIVITAMIN) tablet Take 1 tablet by mouth daily.        . pantoprazole (PROTONIX) 40 MG tablet Take 1 tablet (40 mg total) by mouth daily.  30 tablet  3  . pravastatin (PRAVACHOL) 40 MG tablet Take 1 tablet (40 mg total) by mouth every evening.  90 tablet  3  . predniSONE (DELTASONE) 2.5 MG tablet Take 1 tablet (2.5 mg total) by mouth daily.  90 tablet  1  . Probiotic Product (PROBIOTIC FORMULA PO) Take 1 tablet by mouth daily.        . ranitidine (ZANTAC) 150 MG tablet Take 150 mg by mouth 2 (two) times daily.      [name] zolpidem (AMBIEN) 10 MG tablet Take 5-10 mg by mouth at bedtime as needed.        Past Medical History  Diagnosis Date  . OSTEOARTHRITIS, KNEE, RIGHT   . URINARY INCONTINENCE   . Irritable bowel syndrome   . DIVERTICULITIS, HX OF   . OSTEOPENIA   . Immune thrombocytopenic purpura     chronic - baseline 80-100K, on pred  . UNSPECIFIED PERIPHERAL VASCULAR DISEASE   . OVERACTIVE BLADDER   . DYSLIPIDEMIA   . Glaucoma(365)   . GERD    . Hx of colonoscopy   . HOH (hard of hearing)   . CAD (coronary artery disease)     RCA 40% stenosis cath 01/2011    Past Surgical History  Procedure Date  . Splenectomy 1954  . Abdominal hysterectomy 1963  . L pop pta 10/2009  . Colectomy, simoid 10/2005  . Cataract extraction, bilateral 10/2010    ROS:  As stated in the HPI and negative for all other systems.   PHYSICAL EXAM BP 143/74  Pulse 77  Ht 5\' 3"  (1.6 m)  Wt 153 lb 12.8 oz (69.763 kg)  BMI 27.24 kg/m2GENERAL:  Well appearing NECK:  No jugular venous distention, waveform within normal limits, carotid upstroke brisk and symmetric, no bruits, no thyromegaly LUNGS:  Clear to auscultation bilaterally CHEST:  Unremarkable HEART:  PMI not displaced or sustained,S1 and S2 within normal limits, no S3, no S4, no clicks, no rubs, no murmurs ABD:  Flat, positive bowel sounds normal in frequency in pitch, no bruits, no rebound, no guarding, no midline pulsatile mass, no hepatomegaly, no splenomegaly EXT:  2 plus pulses throughout, no edema, no  cyanosis no clubbing  EKG:  Sinus rhythm, rate 76, axis within normal limits, intervals within normal limits, no acute ST-T wave changes.  04/11/2012  ASSESSMENT AND PLAN  PVD (peripheral vascular disease) -  She has no new symptoms. She will continue with risk reduction.  Chest pain, mid sternal -  She has had no further chest pain.  No work up is indicated.   DYSLIPIDEMIA -  She had a good profile in March.  No change in therapy is indicated.

## 2012-04-17 ENCOUNTER — Ambulatory Visit (HOSPITAL_BASED_OUTPATIENT_CLINIC_OR_DEPARTMENT_OTHER): Payer: Medicare HMO | Admitting: Oncology

## 2012-04-17 ENCOUNTER — Other Ambulatory Visit (HOSPITAL_BASED_OUTPATIENT_CLINIC_OR_DEPARTMENT_OTHER): Payer: Medicare HMO | Admitting: Lab

## 2012-04-17 ENCOUNTER — Telehealth: Payer: Self-pay | Admitting: Oncology

## 2012-04-17 VITALS — BP 150/70 | HR 85 | Temp 97.0°F | Resp 18 | Ht 63.0 in | Wt 152.6 lb

## 2012-04-17 DIAGNOSIS — D693 Immune thrombocytopenic purpura: Secondary | ICD-10-CM

## 2012-04-17 LAB — CBC WITH DIFFERENTIAL/PLATELET
BASO%: 0.4 % (ref 0.0–2.0)
EOS%: 1.3 % (ref 0.0–7.0)
HCT: 41.3 % (ref 34.8–46.6)
LYMPH%: 24.9 % (ref 14.0–49.7)
MCH: 29.3 pg (ref 25.1–34.0)
MCHC: 33.4 g/dL (ref 31.5–36.0)
NEUT%: 67 % (ref 38.4–76.8)
RBC: 4.7 10*6/uL (ref 3.70–5.45)
WBC: 10.6 10*3/uL — ABNORMAL HIGH (ref 3.9–10.3)
lymph#: 2.6 10*3/uL (ref 0.9–3.3)

## 2012-04-17 LAB — COMPREHENSIVE METABOLIC PANEL (CC13)
ALT: 18 U/L (ref 0–55)
AST: 23 U/L (ref 5–34)
Chloride: 105 mEq/L (ref 98–107)
Creatinine: 0.9 mg/dL (ref 0.6–1.1)
Sodium: 144 mEq/L (ref 136–145)
Total Bilirubin: 0.33 mg/dL (ref 0.20–1.20)
Total Protein: 7.1 g/dL (ref 6.4–8.3)

## 2012-04-17 NOTE — Progress Notes (Signed)
Hematology and Oncology Follow Up Visit  Chanley Legault [id number] Apr 11, 1930 78 y.o. 04/17/2012 3:40 PM   Principle Diagnosis: 78 year old with chronic, relapsing ITP diagnosed in the 59s.   Prior Therapy: Patient S/P splenectomy and subsequently treated with high doses of steroids. The patient have had a complete response to steroids back in the 60s and all the way have had a few relapses. Every time she has a relapse she gets restarted on high-dose of steroids and she achieved a complete response. The most recent of relapses before her move to [facility] she was hospitalized for a platelet count of 8000 around the year 2000.  Current therapy: Prednisone to 2.5 mg every other day  Interim History: Mrs. Coville returns today for a follow up visit. Since her last visit, she has been doing well. No new complaints. She is completely asymptomatic at this point. She she is not reporting any bleeding, easy bruisability, or any skin changes. She has not reported any hematochezia or melena she has not reported any epistaxis or hemoptysis. She did develop hip pain few weeks ago and was treated with tapering doses of prednisone that finished at least 2 weeks ago.    Medications: I have reviewed the patient's current medications. Current outpatient prescriptions:Biotin 2500 MCG CAPS, Take by mouth daily., Disp: , Rfl: ;  Calcium-Magnesium-Vitamin D (CITRACAL CALCIUM+D) 600-40-500 MG-MG-UNIT TB24, Take 1 tablet by mouth daily. , Disp: , Rfl: ;  docusate sodium (COLACE) 100 MG capsule, Take 100 mg by mouth daily. , Disp: , Rfl: ;  Flaxseed MISC, by Does not apply route. Take 1 tsp daily, Disp: , Rfl:  Glucosamine-Chondroitin (GLUCOSAMINE CHONDR COMPLEX PO), Take 1 tablet by mouth daily.  , Disp: , Rfl: ;  Magnesium 250 MG TABS, Take 250 mg by mouth daily., Disp: , Rfl: ;  Multiple Vitamin (MULTIVITAMIN) tablet, Take 1 tablet by mouth daily.  , Disp: , Rfl: ;  pantoprazole (PROTONIX) 40 MG tablet, Take 1 tablet (40  mg total) by mouth daily., Disp: 30 tablet, Rfl: 3 pravastatin (PRAVACHOL) 40 MG tablet, Take 1 tablet (40 mg total) by mouth every evening., Disp: 90 tablet, Rfl: 3;  predniSONE (DELTASONE) 2.5 MG tablet, Take 1 tablet (2.5 mg total) by mouth daily., Disp: 90 tablet, Rfl: 1;  Probiotic Product (PROBIOTIC FORMULA PO), Take 1 tablet by mouth daily.  , Disp: , Rfl: ;  ranitidine (ZANTAC) 150 MG tablet, Take 150 mg by mouth 2 (two) times daily., Disp: , Rfl:  zolpidem (AMBIEN) 10 MG tablet, Take 5-10 mg by mouth at bedtime as needed., Disp: , Rfl:   Allergies:  Allergies  Allergen Reactions  . Aspirin Other (See Comments)    ITP    Past Medical History, Surgical history, Social history, and Family History were reviewed and updated.  Review of Systems: Constitutional:  Negative for fever, chills, night sweats, anorexia, weight loss, pain. Cardiovascular: no chest pain or dyspnea on exertion Respiratory: negative Neurological: negative Dermatological: negative ENT: negative Skin: Negative. Gastrointestinal: negative Genito-Urinary: negative Hematological and Lymphatic: negative Breast: negative Musculoskeletal: negative Remaining ROS negative. Physical Exam: Blood pressure 150/70, pulse 85, temperature 97 F (36.1 C), temperature source Oral, resp. rate 18, height 5\' 3"  (1.6 m), weight 152 lb 9.6 oz (69.219 kg). ECOG: 1 General appearance: alert Head: Normocephalic, without obvious abnormality, atraumatic Neck: no adenopathy, no carotid bruit, no JVD, supple, symmetrical, trachea midline and thyroid not enlarged, symmetric, no tenderness/mass/nodules Lymph nodes: Cervical, supraclavicular, and axillary nodes normal. Heart:regular rate and  rhythm, S1, S2 normal, no murmur, click, rub or gallop Lung:chest clear, no wheezing, rales, normal symmetric air entry Abdomin: soft, non-tender, without masses or organomegaly EXT:no erythema, induration, or nodules   Lab Results: Lab Results   Component Value Date   WBC 10.6* 04/17/2012   HGB 13.8 04/17/2012   HCT 41.3 04/17/2012   MCV 87.8 04/17/2012   PLT 121* 04/17/2012     Chemistry      Component Value Date/Time   NA 141 02/12/2012 1003   NA 141 08/16/2011 1343   K 4.2 02/12/2012 1003   K 4.0 08/16/2011 1343   CL 107 02/12/2012 1003   CL 105 08/16/2011 1343   CO2 24 02/12/2012 1003   CO2 27 08/16/2011 1343   BUN 16.0 02/12/2012 1003   BUN 20 08/16/2011 1343   CREATININE 0.8 02/12/2012 1003   CREATININE 0.84 08/16/2011 1343      Component Value Date/Time   CALCIUM 9.1 02/12/2012 1003   CALCIUM 9.5 08/16/2011 1343   ALKPHOS 76 02/12/2012 1003   ALKPHOS 79 08/16/2011 1343   AST 19 02/12/2012 1003   AST 23 08/16/2011 1343   ALT 16 02/12/2012 1003   ALT 13 08/16/2011 1343   BILITOT 0.40 02/12/2012 1003   BILITOT 0.4 08/16/2011 1343      Impression and Plan:   78 year old female with the following issues:  Chronic relapsing ITP: She had multiple relapses in the past that is highly responsive to prednisone. She is currently on low-dose maintenance which seems to be effective in the limiting her relapses. Her counts excellent today. She is not at risk of bleeding at this time. For the time being I will keep her on low-dose of steroids as this seems to be well tolerated at this time. She does not appear to have significant mineral bone loss or any evidence to suggest diabetes.   I will recheck her counts in 4 months, if her platelets are stable then we will continue to monitor it every 6 months. If she has further drop below 50 K, I will treat her with high dose steroids at that time.         Eli Hose, MD 12/12/20133:40 PM

## 2012-04-17 NOTE — Telephone Encounter (Signed)
gv and printed pt appt schedule for March 2014 lab and est

## 2012-05-26 ENCOUNTER — Telehealth: Payer: Self-pay | Admitting: Internal Medicine

## 2012-05-26 DIAGNOSIS — K121 Other forms of stomatitis: Secondary | ICD-10-CM

## 2012-05-26 NOTE — Telephone Encounter (Signed)
ENT refer order done

## 2012-05-26 NOTE — Telephone Encounter (Signed)
SEE NOTE BELOW REGARDING REFERRAL FOR ENT. PLEASE ADVISE.

## 2012-05-26 NOTE — Telephone Encounter (Signed)
Patient notified that [facility] will call her with date and time of referral to ENT per Dr. Felicity Coyer

## 2012-05-26 NOTE — Telephone Encounter (Signed)
Caller: Tamara Robertson/Patient; Phone: [phone]; Reason for Call: Pt states she was seen at an Urgent Care on 05/24/12 for ulcers in her mouth.  Pt was treated with Miracle Mouthwash which is controlling the symptoms.  Pt states the provider at Urgent Care thought she needed a referral to an ENT.  Pt is calling to get referral sent in for her.  Pt is willing to come in for office visit if it is needed to get the referral.  OFFICE PLEASE FOLLOW UP WITH PATIENT IF REFERRAL CAN BE MADE OR IF PT NEEDS OV FIRST.

## 2012-06-02 ENCOUNTER — Telehealth: Payer: Self-pay | Admitting: Cardiology

## 2012-06-02 DIAGNOSIS — E785 Hyperlipidemia, unspecified: Secondary | ICD-10-CM

## 2012-06-02 MED ORDER — PRAVASTATIN SODIUM 40 MG PO TABS: 40.0000 mg | ORAL_TABLET | Freq: Every evening | ORAL | Status: DC

## 2012-06-02 NOTE — Telephone Encounter (Signed)
Pt requesting RX on pravastatin   - she is unasure of which mail order company her new insurance uses.  Requested RX be sent into [facility] [place]

## 2012-06-02 NOTE — Telephone Encounter (Signed)
Pt has new insurance which is [facility] [phone] Health plan # [phone] member ID # [id number] Group# [id number] and needs pravistatin 40mg  .

## 2012-06-03 ENCOUNTER — Ambulatory Visit (INDEPENDENT_AMBULATORY_CARE_PROVIDER_SITE_OTHER): Payer: Medicare Other | Admitting: Internal Medicine

## 2012-06-03 ENCOUNTER — Other Ambulatory Visit (INDEPENDENT_AMBULATORY_CARE_PROVIDER_SITE_OTHER): Payer: Medicare Other

## 2012-06-03 ENCOUNTER — Encounter: Payer: Self-pay | Admitting: Internal Medicine

## 2012-06-03 VITALS — BP 132/72 | HR 82 | Temp 97.0°F

## 2012-06-03 DIAGNOSIS — R5383 Other fatigue: Secondary | ICD-10-CM

## 2012-06-03 DIAGNOSIS — R5381 Other malaise: Secondary | ICD-10-CM

## 2012-06-03 DIAGNOSIS — G629 Polyneuropathy, unspecified: Secondary | ICD-10-CM | POA: Insufficient documentation

## 2012-06-03 DIAGNOSIS — R51 Headache: Secondary | ICD-10-CM

## 2012-06-03 DIAGNOSIS — G609 Hereditary and idiopathic neuropathy, unspecified: Secondary | ICD-10-CM

## 2012-06-03 LAB — CBC WITH DIFFERENTIAL/PLATELET
Basophils Absolute: 0.1 10*3/uL (ref 0.0–0.1)
Eosinophils Absolute: 0.2 10*3/uL (ref 0.0–0.7)
Hemoglobin: 13.5 g/dL (ref 12.0–15.0)
Lymphocytes Relative: 31.4 % (ref 12.0–46.0)
MCHC: 33.9 g/dL (ref 30.0–36.0)
Monocytes Relative: 15.1 % — ABNORMAL HIGH (ref 3.0–12.0)
Neutrophils Relative %: 50.9 % (ref 43.0–77.0)
Platelets: 120 10*3/uL — ABNORMAL LOW (ref 150.0–400.0)
RDW: 12.5 % (ref 11.5–14.6)

## 2012-06-03 LAB — HEPATIC FUNCTION PANEL
ALT: 18 U/L (ref 0–35)
AST: 22 U/L (ref 0–37)
Alkaline Phosphatase: 80 U/L (ref 39–117)
Total Bilirubin: 0.6 mg/dL (ref 0.3–1.2)

## 2012-06-03 LAB — VITAMIN B12: Vitamin B-12: 467 pg/mL (ref 211–911)

## 2012-06-03 LAB — BASIC METABOLIC PANEL
BUN: 21 mg/dL (ref 6–23)
Calcium: 9.2 mg/dL (ref 8.4–10.5)
Creatinine, Ser: 0.9 mg/dL (ref 0.4–1.2)
GFR: 62.31 mL/min (ref >60.00)
Glucose, Bld: 86 mg/dL (ref 70–99)
Sodium: 140 mEq/L (ref 135–145)

## 2012-06-03 LAB — TSH: TSH: 2.95 u[IU]/mL (ref 0.35–5.50)

## 2012-06-03 LAB — HEMOGLOBIN A1C: Hgb A1c MFr Bld: 5.9 % (ref 4.6–6.5)

## 2012-06-03 NOTE — Progress Notes (Signed)
Subjective:    Patient ID: Tamara Robertson, female    DOB: [birth year], 79 y.o.   MRN: [id number]  HPI  See ZH:YQMV brief HA on R frontal side, last <2 sec, occurs few times per week - not associated with vision loss, weakness, slurring or numbness. Pain and duration o mild/short, no OTC meds or other intervention/therapy required  Also reports HH NP eval by insurance discovered ?neuropathy -decreased vibratory sensation in hands and feet, denies pain, numbness or awareness of same  Also complains of overwhelming fatigue for 6 weeks Denies dysphoria, sleep abnormalities or recent medication changes  Past Medical History  Diagnosis Date  . OSTEOARTHRITIS, KNEE, RIGHT   . URINARY INCONTINENCE   . Irritable bowel syndrome   . DIVERTICULITIS, HX OF   . OSTEOPENIA   . Immune thrombocytopenic purpura     chronic - baseline 80-100K, on pred  . UNSPECIFIED PERIPHERAL VASCULAR DISEASE   . OVERACTIVE BLADDER   . DYSLIPIDEMIA   . Glaucoma(365)   . GERD   . Hx of colonoscopy   . HOH (hard of hearing)   . CAD (coronary artery disease)     RCA 40% stenosis cath 01/2011     Review of Systems  Constitutional: Positive for fatigue. Negative for fever and unexpected weight change.  HENT: Negative for ear pain, facial swelling, neck pain and neck stiffness.   Eyes: Negative for photophobia, redness and visual disturbance.  Respiratory: Negative for cough and shortness of breath.   Cardiovascular: Negative for chest pain and palpitations.  Musculoskeletal: Positive for back pain (chronic). Negative for gait problem.  Neurological: Negative for dizziness, facial asymmetry and light-headedness.       Objective:   Physical Exam BP 132/72  Pulse 82  Temp 97 F (36.1 C) (Oral)  SpO2 96% Wt Readings from Last 3 Encounters:  04/17/12 152 lb 9.6 oz (69.219 kg)  04/11/12 153 lb 12.8 oz (69.763 kg)  02/28/12 153 lb 1.9 oz (69.455 kg)   Constitutional: She appears well-developed and well-nourished.  No distress.  HENT: Head: Normocephalic and atraumatic. Ears: B TMs ok, no erythema or effusion; Nose: Nose normal. Mouth/Throat: Oropharynx is clear and moist. No oropharyngeal exudate.  Eyes: Conjunctivae and EOM are normal. Pupils are equal, round, and reactive to light. No scleral icterus.  Neck: Normal range of motion. Neck supple. No JVD present. No thyromegaly present.  Cardiovascular: Normal rate, regular rhythm and normal heart sounds.  No murmur heard. No BLE edema. Pulmonary/Chest: Effort normal and breath sounds normal. No respiratory distress. She has no wheezes.  Neurological: She is alert and oriented to person, place, and time. No cranial nerve deficit. Coordination, gait, balance, speech all normal.  Skin: Skin is warm and dry. No rash noted. No erythema.  Psychiatric: She has a normal mood and affect. Her behavior is normal. Judgment and thought content normal.   Lab Results  Component Value Date   WBC 10.6* 04/17/2012   HGB 13.8 04/17/2012   HCT 41.3 04/17/2012   PLT 121* 04/17/2012   GLUCOSE 122* 04/17/2012   CHOL 188 07/13/2011   TRIG 116.0 07/13/2011   HDL 66.00 07/13/2011   LDLDIRECT 138.1 01/23/2011   LDLCALC 99 07/13/2011   ALT 18 04/17/2012   AST 23 04/17/2012   NA 144 04/17/2012   K 4.7 04/17/2012   CL 105 04/17/2012   CREATININE 0.9 04/17/2012   BUN 16.0 04/17/2012   CO2 31* 04/17/2012   TSH 1.40 01/23/2011   INR 1.1* 02/16/2011  No results found for this basename: VITAMINB12       Assessment & Plan:   Fatigue - nonspecific symptoms/exam - check screening labs  Peripheral neuropathy -decreased vibratory sensation in hands and feet, no other symptomatic concern. Check TSH, B12 and A1c  Episodic, mild headache. No concerning history or exam findings. Patient educated on red flags and over-the-counter symptom care. Reassurance provided, to call if worse or unimproved

## 2012-06-03 NOTE — Patient Instructions (Signed)
It was good to see you today. We have reviewed your prior records including labs and tests today Test(s) ordered today. Your results will be released to MyChart (or called to you) after review, usually within 72hours after test completion. If any changes need to be made, you will be notified at that same time. Medications reviewed and updated, no changes at this time.

## 2012-06-03 NOTE — Progress Notes (Signed)
Subjective:    Patient ID: Tamara Robertson, female    DOB: 04/25/1930, 79 y.o.   MRN: [id number]  CC: Headache HPI C/o headaches x 5 days.  Headaches are short 1-2 minute episodes of mild stabbing pain frontotemporal and unilaterally to both L or R side a few times each day.  Pt see white spots unilaterally on the affected side during the headache. Pt states no headaches last night or this morning.  Denies associated vision changes, nausea or vomiting . Fatigue- ongoing fatigue with frequent naps throughout the day and sleepiness. Pt denies any nighttime sleep disturbances and feels she sleep well at night.  Pedal neuropathy- Pt had [facility] nurse come for a house visit and completed a monofilament tests for neuropathy on both feet.  Pt states she "couldn't feel a thing".  The vibratory sense was also tested and negative. Pt denies tingling, numbness and pain of extremities.  Past Medical History  Diagnosis Date  . OSTEOARTHRITIS, KNEE, RIGHT   . URINARY INCONTINENCE   . Irritable bowel syndrome   . DIVERTICULITIS, HX OF   . OSTEOPENIA   . Immune thrombocytopenic purpura     chronic - baseline 80-100K, on pred  . UNSPECIFIED PERIPHERAL VASCULAR DISEASE   . OVERACTIVE BLADDER   . DYSLIPIDEMIA   . Glaucoma(365)   . GERD   . Hx of colonoscopy   . HOH (hard of hearing)   . CAD (coronary artery disease)     RCA 40% stenosis cath 01/2011    Review of Systems  Constitutional: Positive for fatigue. Negative for fever and activity change.  HENT: Negative for hearing loss, congestion, neck pain, neck stiffness and sinus pressure.   Eyes: Negative for photophobia and pain.  Respiratory: Negative for cough and shortness of breath.   Cardiovascular: Negative for chest pain and leg swelling.  Neurological: Negative for dizziness, syncope and weakness.      Objective:   Physical Exam  Constitutional: She is oriented to person, place, and time. She appears well-developed and well-nourished. No  distress.  Eyes: Conjunctivae normal and EOM are normal. Pupils are equal, round, and reactive to light.  Neck: Normal range of motion. Neck supple. No thyromegaly present.  Cardiovascular: Normal rate, regular rhythm and normal heart sounds.  Exam reveals no gallop and no friction rub.   No murmur heard. Pulmonary/Chest: Effort normal and breath sounds normal. No respiratory distress.  Abdominal: Soft. Bowel sounds are normal. She exhibits no distension. There is no tenderness.  Lymphadenopathy:    She has no cervical adenopathy.  Neurological: She is alert and oriented to person, place, and time.       Foot exam shows full ROM, intact skin with no ulceration/injury, no swelling but reduced sharp/dull sensation, no light touch sensation but intact position sense bilaterally.  Skin: Skin is warm and dry.  Psychiatric: She has a normal mood and affect. Her behavior is normal. Judgment and thought content normal.    BP 132/72  Pulse 82  Temp 97 F (36.1 C) (Oral)  SpO2 96% Lab Results  Component Value Date   WBC 10.6* 04/17/2012   HGB 13.8 04/17/2012   HCT 41.3 04/17/2012   PLT 121* 04/17/2012   GLUCOSE 122* 04/17/2012   CHOL 188 07/13/2011   TRIG 116.0 07/13/2011   HDL 66.00 07/13/2011   LDLDIRECT 138.1 01/23/2011   LDLCALC 99 07/13/2011   ALT 18 04/17/2012   AST 23 04/17/2012   NA 144 04/17/2012   K 4.7 04/17/2012  CL 105 04/17/2012   CREATININE 0.9 04/17/2012   BUN 16.0 04/17/2012   CO2 31* 04/17/2012   TSH 1.40 01/23/2011   INR 1.1* 02/16/2011       Assessment & Plan:  Headaches- Pt informed of potentially transient nature of headaches.  Given advice that if continues to be a problem f/u with her opthamologist for evaluation of floaters.  Ok to take tylenol for the associated pain.  Fatigue- I have reviewed previous labs and records.  No obvious cause.  Pt self d/c'd meds x 3 days with no improvement.  Will check labs today for CBC, BMET, HFP, TSH and B12.    Neuropathy-  Medications and labs reviewed.  No obvious cause. Will check B12 and Hgb A1C today.  Judeth Porch PA-S  I have personally reviewed this case with PA student. I also personally examined this patient. I agree with history and findings as documented above. I reviewed, discussed and approve of the assessment and plan as listed above. Rene Paci, MD

## 2012-06-25 ENCOUNTER — Telehealth: Payer: Self-pay | Admitting: Oncology

## 2012-06-25 NOTE — Telephone Encounter (Signed)
s/w pt to r/s 3.26.14 appt...pt requested to have appt on 4.8.14 b/c her primary physician just had full panel done less than a mth ago.Tamara KitchenMarland KitchenMarland KitchenDone

## 2012-07-02 ENCOUNTER — Other Ambulatory Visit: Payer: Self-pay | Admitting: Internal Medicine

## 2012-07-30 ENCOUNTER — Ambulatory Visit: Payer: Medicare HMO | Admitting: Oncology

## 2012-07-30 ENCOUNTER — Other Ambulatory Visit: Payer: Medicare HMO | Admitting: Lab

## 2012-08-12 ENCOUNTER — Ambulatory Visit (HOSPITAL_BASED_OUTPATIENT_CLINIC_OR_DEPARTMENT_OTHER): Payer: Medicare Other | Admitting: Oncology

## 2012-08-12 ENCOUNTER — Telehealth: Payer: Self-pay | Admitting: Oncology

## 2012-08-12 ENCOUNTER — Other Ambulatory Visit (HOSPITAL_BASED_OUTPATIENT_CLINIC_OR_DEPARTMENT_OTHER): Payer: Medicare Other | Admitting: Lab

## 2012-08-12 VITALS — BP 143/58 | HR 74 | Temp 96.7°F | Resp 19 | Ht 63.0 in | Wt 152.0 lb

## 2012-08-12 DIAGNOSIS — D693 Immune thrombocytopenic purpura: Secondary | ICD-10-CM

## 2012-08-12 LAB — CBC WITH DIFFERENTIAL/PLATELET
Basophils Absolute: 0 10*3/uL (ref 0.0–0.1)
Eosinophils Absolute: 0.5 10*3/uL (ref 0.0–0.5)
HGB: 12.9 g/dL (ref 11.6–15.9)
MONO%: 4.7 % (ref 0.0–14.0)
NEUT#: 7.2 10*3/uL — ABNORMAL HIGH (ref 1.5–6.5)
RBC: 4.6 10*6/uL (ref 3.70–5.45)
RDW: 14.2 % (ref 11.2–14.5)
WBC: 11.8 10*3/uL — ABNORMAL HIGH (ref 3.9–10.3)
lymph#: 3.6 10*3/uL — ABNORMAL HIGH (ref 0.9–3.3)
nRBC: 0 % (ref 0–0)

## 2012-08-12 LAB — COMPREHENSIVE METABOLIC PANEL
ALT: 13 U/L (ref 0–35)
Albumin: 4.2 g/dL (ref 3.5–5.2)
Alkaline Phosphatase: 82 U/L (ref 39–117)
CO2: 28 mEq/L (ref 19–32)
Glucose, Bld: 130 mg/dL — ABNORMAL HIGH (ref 70–99)
Potassium: 4.6 mEq/L (ref 3.5–5.3)
Sodium: 139 mEq/L (ref 135–145)
Total Bilirubin: 0.3 mg/dL (ref 0.3–1.2)
Total Protein: 6.6 g/dL (ref 6.0–8.3)

## 2012-08-12 NOTE — Progress Notes (Signed)
Hematology and Oncology Follow Up Visit  Tamara Robertson [id number] January 28, 1930 79 y.o. 08/12/2012 3:03 PM   Principle Diagnosis: 79 year old with chronic, relapsing ITP diagnosed in the 24s.   Prior Therapy: Patient S/P splenectomy and subsequently treated with high doses of steroids. The patient have had a complete response to steroids back in the 60s and all the way have had a few relapses. Every time she has a relapse she gets restarted on high-dose of steroids and she achieved a complete response. The most recent of relapses before her move to [facility] she was hospitalized for a platelet count of 8000 around the year 2000.  Current therapy: Prednisone to 2.5 mg every other day  Interim History: Tamara Robertson returns today for a follow up visit. Since her last visit, she has been doing well. No new complaints. She is completely asymptomatic at this point. She she is not reporting any bleeding, easy bruisability, or any skin changes. She has not reported any hematochezia or melena she has not reported any epistaxis or hemoptysis. No new complaints at this time.    Medications: I have reviewed the patient's current medications. Current outpatient prescriptions:Biotin 2500 MCG CAPS, Take by mouth daily., Disp: , Rfl: ;  Calcium-Magnesium-Vitamin D (CITRACAL CALCIUM+D) 600-40-500 MG-MG-UNIT TB24, Take 1 tablet by mouth daily. , Disp: , Rfl: ;  docusate sodium (COLACE) 100 MG capsule, Take 100 mg by mouth daily. , Disp: , Rfl: ;  Flaxseed MISC, by Does not apply route. Take 1 tsp daily, Disp: , Rfl: ;  Magnesium 250 MG TABS, Take 250 mg by mouth daily., Disp: , Rfl:  Multiple Vitamin (MULTIVITAMIN) tablet, Take 1 tablet by mouth daily.  , Disp: , Rfl: ;  pantoprazole (PROTONIX) 40 MG tablet, TAKE ONE TABLET BY MOUTH ONE TIME DAILY, Disp: 30 tablet, Rfl: 5;  pravastatin (PRAVACHOL) 40 MG tablet, Take 1 tablet (40 mg total) by mouth every evening., Disp: 90 tablet, Rfl: 3;  predniSONE (DELTASONE) 2.5 MG  tablet, Take 1 tablet (2.5 mg total) by mouth daily., Disp: 90 tablet, Rfl: 1 Probiotic Product (PROBIOTIC FORMULA PO), Take 1 tablet by mouth daily.  , Disp: , Rfl: ;  ranitidine (ZANTAC) 150 MG tablet, Take 150 mg by mouth 2 (two) times daily., Disp: , Rfl: ;  UNKNOWN TO PATIENT, Inject 1 drop into the eye 2 (two) times daily. Eye drops for Glaucoma 1 drop to each eye BID, Disp: , Rfl: ;  zolpidem (AMBIEN) 10 MG tablet, Take 5-10 mg by mouth at bedtime as needed., Disp: , Rfl:   Allergies:  Allergies  Allergen Reactions  . Aspirin Other (See Comments)    ITP    Past Medical History, Surgical history, Social history, and Family History were reviewed and updated.  Review of Systems: Constitutional:  Negative for fever, chills, night sweats, anorexia, weight loss, pain. Cardiovascular: no chest pain or dyspnea on exertion Respiratory: negative Neurological: negative Dermatological: negative ENT: negative Skin: Negative. Gastrointestinal: negative Genito-Urinary: negative Hematological and Lymphatic: negative Breast: negative Musculoskeletal: negative Remaining ROS negative. Physical Exam: Blood pressure 143/58, pulse 74, temperature 96.7 F (35.9 C), temperature source Oral, resp. rate 19, height 5\' 3"  (1.6 m), weight 152 lb (68.947 kg). ECOG: 1 General appearance: alert Head: Normocephalic, without obvious abnormality, atraumatic Neck: no adenopathy, no carotid bruit, no JVD, supple, symmetrical, trachea midline and thyroid not enlarged, symmetric, no tenderness/mass/nodules Lymph nodes: Cervical, supraclavicular, and axillary nodes normal. Heart:regular rate and rhythm, S1, S2 normal, no murmur, click, rub or  gallop Lung:chest clear, no wheezing, rales, normal symmetric air entry Abdomin: soft, non-tender, without masses or organomegaly EXT:no erythema, induration, or nodules   Lab Results: Lab Results  Component Value Date   WBC 11.8* 08/12/2012   HGB 12.9 08/12/2012   HCT  39.1 08/12/2012   MCV 85.0 08/12/2012   PLT 106* 08/12/2012     Chemistry      Component Value Date/Time   NA 140 06/03/2012 1012   NA 144 04/17/2012 1452   K 4.2 06/03/2012 1012   K 4.7 04/17/2012 1452   CL 105 06/03/2012 1012   CL 105 04/17/2012 1452   CO2 30 06/03/2012 1012   CO2 31* 04/17/2012 1452   BUN 21 06/03/2012 1012   BUN 16.0 04/17/2012 1452   CREATININE 0.9 06/03/2012 1012   CREATININE 0.9 04/17/2012 1452      Component Value Date/Time   CALCIUM 9.2 06/03/2012 1012   CALCIUM 9.7 04/17/2012 1452   ALKPHOS 80 06/03/2012 1012   ALKPHOS 91 04/17/2012 1452   AST 22 06/03/2012 1012   AST 23 04/17/2012 1452   ALT 18 06/03/2012 1012   ALT 18 04/17/2012 1452   BILITOT 0.6 06/03/2012 1012   BILITOT 0.33 04/17/2012 1452      Impression and Plan:   79 year old female with the following issues:  Chronic relapsing ITP: She had multiple relapses in the past that is highly responsive to prednisone. She is currently on low-dose maintenance which seems to be effective in the limiting her relapses. Her counts excellent today. She is not at risk of bleeding at this time. For the time being I will keep her on low-dose of steroids as this seems to be well tolerated at this time. She does not appear to have significant mineral bone loss or any evidence to suggest diabetes.   I will recheck her counts in 6 months. If she has further drop below 50 K, I will treat her with high dose steroids at that time.         [facility], MD 4/8/20143:03 PM

## 2012-08-28 ENCOUNTER — Ambulatory Visit (INDEPENDENT_AMBULATORY_CARE_PROVIDER_SITE_OTHER): Payer: Medicare Other | Admitting: Internal Medicine

## 2012-08-28 ENCOUNTER — Encounter: Payer: Self-pay | Admitting: Internal Medicine

## 2012-08-28 VITALS — BP 130/80 | HR 64 | Temp 97.0°F | Wt 152.4 lb

## 2012-08-28 DIAGNOSIS — G609 Hereditary and idiopathic neuropathy, unspecified: Secondary | ICD-10-CM

## 2012-08-28 DIAGNOSIS — E785 Hyperlipidemia, unspecified: Secondary | ICD-10-CM

## 2012-08-28 DIAGNOSIS — D693 Immune thrombocytopenic purpura: Secondary | ICD-10-CM

## 2012-08-28 DIAGNOSIS — G629 Polyneuropathy, unspecified: Secondary | ICD-10-CM

## 2012-08-28 MED ORDER — PREDNISONE 2.5 MG PO TABS: 2.5000 mg | ORAL_TABLET | Freq: Every day | ORAL | Status: DC

## 2012-08-28 NOTE — Assessment & Plan Note (Signed)
No evidence for metabolic abnormality on January 2014 lab review Refer to podiatry at patient request, Dr. Dellia Nims

## 2012-08-28 NOTE — Patient Instructions (Signed)
It was good to see you today.  We have reviewed your prior records including labs and tests today  Test(s) ordered today. Your results will be released to MyChart (or called to you) after review, usually within 72hours after test completion. If any changes need to be made, you will be notified at that same time.  Medications reviewed and updated, no changes recommended at this time.  Please schedule followup in 6 months, call sooner if problems.  

## 2012-08-28 NOTE — Assessment & Plan Note (Signed)
Flare 06/2011 precipitated by URI S/p heme eval 07/2011 for same S/p dose pak with resolution - on low dose maintainence pred chronically The current medical regimen is effective;  continue present plan and medications.

## 2012-08-28 NOTE — Progress Notes (Signed)
Subjective:    Patient ID: Tamara Robertson, female    DOB: [birth year], 79 y.o.   MRN: [id number]  HPI  Here for follow up - reviewed chronic medical issues and interval histroy:  Past Medical History  Diagnosis Date  . OSTEOARTHRITIS, KNEE, RIGHT   . URINARY INCONTINENCE   . Irritable bowel syndrome   . DIVERTICULITIS, HX OF   . OSTEOPENIA   . Immune thrombocytopenic purpura     chronic - baseline 80-100K, on pred  . UNSPECIFIED PERIPHERAL VASCULAR DISEASE   . OVERACTIVE BLADDER   . DYSLIPIDEMIA   . Glaucoma(365)   . GERD   . Hx of colonoscopy   . HOH (hard of hearing)   . CAD (coronary artery disease)     RCA 40% stenosis cath 01/2011     Review of Systems  Constitutional: Positive for fatigue. Negative for fever and unexpected weight change.  HENT: Negative for ear pain, facial swelling, neck pain and neck stiffness.   Eyes: Negative for photophobia, redness and visual disturbance.  Respiratory: Negative for cough and shortness of breath.   Cardiovascular: Negative for chest pain and palpitations.  Musculoskeletal: Positive for back pain (chronic). Negative for gait problem.  Neurological: Negative for dizziness, facial asymmetry and light-headedness.       Objective:   Physical Exam  BP 130/80  Pulse 64  Temp(Src) 97 F (36.1 C) (Oral)  Wt 152 lb 6.4 oz (69.128 kg)  BMI 27 kg/m2  SpO2 99% Wt Readings from Last 3 Encounters:  08/28/12 152 lb 6.4 oz (69.128 kg)  08/12/12 152 lb (68.947 kg)  04/17/12 152 lb 9.6 oz (69.219 kg)   Constitutional: She appears well-developed and well-nourished. No distress.   Neck: Normal range of motion. Neck supple. No JVD present. No thyromegaly present.  Cardiovascular: Normal rate, regular rhythm and normal heart sounds.  No murmur heard. No BLE edema. Pulmonary/Chest: Effort normal and breath sounds normal. No respiratory distress. She has no wheezes.  Neurological: She is alert and oriented to person, place, and time. No cranial  nerve deficit. Coordination, gait, balance, speech all normal.  Skin: Skin is warm and dry. No rash noted. No erythema.  Psychiatric: She has a normal mood and affect. Her behavior is normal. Judgment and thought content normal.   Lab Results  Component Value Date   WBC 11.8* 08/12/2012   HGB 12.9 08/12/2012   HCT 39.1 08/12/2012   PLT 106* 08/12/2012   GLUCOSE 130* 08/12/2012   CHOL 188 07/13/2011   TRIG 116.0 07/13/2011   HDL 66.00 07/13/2011   LDLDIRECT 138.1 01/23/2011   LDLCALC 99 07/13/2011   ALT 13 08/12/2012   AST 19 08/12/2012   NA 139 08/12/2012   K 4.6 08/12/2012   CL 103 08/12/2012   CREATININE 1.01 08/12/2012   BUN 17 08/12/2012   CO2 28 08/12/2012   TSH 2.95 06/03/2012   INR 1.1* 02/16/2011   HGBA1C 5.9 06/03/2012   Lab Results  Component Value Date   VITAMINB12 467 06/03/2012       Assessment & Plan:

## 2012-08-28 NOTE — Assessment & Plan Note (Signed)
On prava -check annually, last lipids reviewed The current medical regimen is effective;  continue present plan and medications.

## 2012-09-05 ENCOUNTER — Other Ambulatory Visit (INDEPENDENT_AMBULATORY_CARE_PROVIDER_SITE_OTHER): Payer: Medicare Other

## 2012-09-05 DIAGNOSIS — E785 Hyperlipidemia, unspecified: Secondary | ICD-10-CM

## 2012-09-08 ENCOUNTER — Encounter: Payer: Self-pay | Admitting: Internal Medicine

## 2012-09-08 DIAGNOSIS — M79672 Pain in left foot: Secondary | ICD-10-CM

## 2012-09-08 DIAGNOSIS — L600 Ingrowing nail: Secondary | ICD-10-CM

## 2012-09-08 LAB — LIPID PANEL
LDL Cholesterol: 104 mg/dL — ABNORMAL HIGH (ref 0–99)
Total CHOL/HDL Ratio: 4

## 2012-10-08 ENCOUNTER — Encounter: Payer: Self-pay | Admitting: Cardiology

## 2012-10-08 ENCOUNTER — Ambulatory Visit (INDEPENDENT_AMBULATORY_CARE_PROVIDER_SITE_OTHER): Payer: Medicare Other | Admitting: Cardiology

## 2012-10-08 VITALS — BP 130/70 | HR 65 | Ht 62.0 in | Wt 148.8 lb

## 2012-10-08 DIAGNOSIS — R072 Precordial pain: Secondary | ICD-10-CM

## 2012-10-08 DIAGNOSIS — R0789 Other chest pain: Secondary | ICD-10-CM

## 2012-10-08 NOTE — Patient Instructions (Addendum)
The current medical regimen is effective;  continue present plan and medications.  Follow up in 6 months with Dr Hochrein.  You will receive a letter in the mail 2 months before you are due.  Please call us when you receive this letter to schedule your follow up appointment.  

## 2012-10-08 NOTE — Progress Notes (Signed)
HPI The patient presents for evaluation of PVD.  Since I last saw her she has done well.  The patient denies any new symptoms such as chest discomfort, neck or arm discomfort. There has been no new shortness of breath, PND or orthopnea. There have been no reported palpitations, presyncope or syncope.  She walks routinely for exercise.   Allergies  Allergen Reactions  . Aspirin Other (See Comments)    ITP    Current Outpatient Prescriptions  Medication Sig Dispense Refill  . Biotin 2500 MCG CAPS Take by mouth daily.      . Calcium-Magnesium-Vitamin D (CITRACAL CALCIUM+D) 600-40-500 MG-MG-UNIT TB24 Take 1 tablet by mouth daily.       [name] docusate sodium (COLACE) 100 MG capsule Take 100 mg by mouth daily.       . dorzolamide (TRUSOPT) 2 % ophthalmic solution Place 1 drop into both eyes 2 (two) times daily.      . Flaxseed MISC by Does not apply route. Take 1 tsp daily      . Magnesium 250 MG TABS Take 250 mg by mouth daily.      . Multiple Vitamin (MULTIVITAMIN) tablet Take 1 tablet by mouth daily.        . pantoprazole (PROTONIX) 40 MG tablet TAKE ONE TABLET BY MOUTH ONE TIME DAILY  30 tablet  5  . pravastatin (PRAVACHOL) 40 MG tablet Take 1 tablet (40 mg total) by mouth every evening.  90 tablet  3  . Probiotic Product (PROBIOTIC FORMULA PO) Take 1 tablet by mouth daily.        . ranitidine (ZANTAC) 150 MG tablet Take 150 mg by mouth 2 (two) times daily.      [name] zolpidem (AMBIEN) 10 MG tablet Take 5-10 mg by mouth at bedtime as needed.      . predniSONE (DELTASONE) 2.5 MG tablet Take 1 tablet (2.5 mg total) by mouth daily.  90 tablet  1   No current facility-administered medications for this visit.    Past Medical History  Diagnosis Date  . OSTEOARTHRITIS, KNEE, RIGHT   . URINARY INCONTINENCE   . Irritable bowel syndrome   . DIVERTICULITIS, HX OF   . OSTEOPENIA   . Immune thrombocytopenic purpura     chronic - baseline 80-100K, on pred  . UNSPECIFIED PERIPHERAL VASCULAR DISEASE     . OVERACTIVE BLADDER   . DYSLIPIDEMIA   . Glaucoma   . GERD   . Hx of colonoscopy   . HOH (hard of hearing)   . CAD (coronary artery disease)     RCA 40% stenosis cath 01/2011    Past Surgical History  Procedure Laterality Date  . Splenectomy  1954  . Abdominal hysterectomy  1963  . L pop pta  10/2009  . Laparoscopic sigmoid colectomy  10/2005  . Cataract extraction, bilateral  10/2010    ROS:  As stated in the HPI and negative for all other systems.   PHYSICAL EXAM BP 130/70  Pulse 65  Ht 5\' 2"  (1.575 m)  Wt 148 lb 12.8 oz (67.495 kg)  BMI 27.21 kg/m2 GENERAL:  Well appearing NECK:  No jugular venous distention, waveform within normal limits, carotid upstroke brisk and symmetric, no bruits, no thyromegaly LUNGS:  Clear to auscultation bilaterally CHEST:  Unremarkable HEART:  PMI not displaced or sustained,S1 and S2 within normal limits, no S3, no S4, no clicks, no rubs, no murmurs ABD:  Flat, positive bowel sounds normal in frequency in pitch, no  bruits, no rebound, no guarding, no midline pulsatile mass, no hepatomegaly, no splenomegaly EXT:  2 plus pulses throughout, no edema, no cyanosis no clubbing  EKG:  Sinus rhythm, rate 65, axis within normal limits, intervals within normal limits, no acute ST-T wave changes.  10/08/2012  ASSESSMENT AND PLAN  PVD (peripheral vascular disease) -  She has no new symptoms. She will continue with risk reduction.  DYSLIPIDEMIA -  She had a good profile in March.  No change in therapy is indicated.

## 2012-12-19 ENCOUNTER — Other Ambulatory Visit (INDEPENDENT_AMBULATORY_CARE_PROVIDER_SITE_OTHER): Payer: Medicare Other

## 2012-12-19 ENCOUNTER — Ambulatory Visit (INDEPENDENT_AMBULATORY_CARE_PROVIDER_SITE_OTHER): Payer: Medicare Other | Admitting: Internal Medicine

## 2012-12-19 ENCOUNTER — Encounter: Payer: Self-pay | Admitting: Internal Medicine

## 2012-12-19 VITALS — BP 128/72 | HR 72 | Temp 97.2°F | Wt 147.1 lb

## 2012-12-19 DIAGNOSIS — K219 Gastro-esophageal reflux disease without esophagitis: Secondary | ICD-10-CM

## 2012-12-19 DIAGNOSIS — R198 Other specified symptoms and signs involving the digestive system and abdomen: Secondary | ICD-10-CM

## 2012-12-19 DIAGNOSIS — R4182 Altered mental status, unspecified: Secondary | ICD-10-CM

## 2012-12-19 DIAGNOSIS — R194 Change in bowel habit: Secondary | ICD-10-CM

## 2012-12-19 LAB — BASIC METABOLIC PANEL WITH GFR
BUN: 17 mg/dL (ref 6–23)
CO2: 32 meq/L (ref 19–32)
Calcium: 9.5 mg/dL (ref 8.4–10.5)
Chloride: 104 meq/L (ref 96–112)
Creatinine, Ser: 0.8 mg/dL (ref 0.4–1.2)
GFR: 76.41 mL/min
Glucose, Bld: 84 mg/dL (ref 70–99)
Potassium: 4.5 meq/L (ref 3.5–5.1)
Sodium: 139 meq/L (ref 135–145)

## 2012-12-19 LAB — CBC WITH DIFFERENTIAL/PLATELET
Basophils Relative: 0.4 % (ref 0.0–3.0)
Eosinophils Relative: 2.4 % (ref 0.0–5.0)
Hemoglobin: 14 g/dL (ref 12.0–15.0)
Lymphocytes Relative: 34.2 % (ref 12.0–46.0)
Monocytes Relative: 12.6 % — ABNORMAL HIGH (ref 3.0–12.0)
Neutro Abs: 5.6 10*3/uL (ref 1.4–7.7)
RBC: 4.86 Mil/uL (ref 3.87–5.11)

## 2012-12-19 LAB — HEPATIC FUNCTION PANEL
ALT: 16 U/L (ref 0–35)
AST: 21 U/L (ref 0–37)
Albumin: 4.1 g/dL (ref 3.5–5.2)
Alkaline Phosphatase: 72 U/L (ref 39–117)
Total Protein: 7.3 g/dL (ref 6.0–8.3)

## 2012-12-19 NOTE — Patient Instructions (Signed)
It was good to see you today. We have reviewed your prior records including labs and tests today Test(s) ordered today. Your results will be released to MyChart (or called to you) after review, usually within 72hours after test completion. If any changes need to be made, you will be notified at that same time. Medications reviewed and updated, hold Protonix -no other changes recommended at this time. If bowel symptoms worse, any fever, bleeding or increasing pain - please notify us for referral to Gastroenterology specialist as needed Also, if  Increasing problems with headache, dizziness, memory dysfunction or seizure-like activity, call for further evaluation or referral to neurology as needed Please schedule followup in 6 months, call sooner if problems.

## 2012-12-19 NOTE — Progress Notes (Signed)
Subjective:    Patient ID: Tamara Robertson, female    DOB: [birth year], 79 y.o.   MRN: [id number]  HPI  Past Medical History  Diagnosis Date  . OSTEOARTHRITIS, KNEE, RIGHT   . URINARY INCONTINENCE   . Irritable bowel syndrome   . DIVERTICULITIS, HX OF   . OSTEOPENIA   . Immune thrombocytopenic purpura     chronic - baseline 80-100K, on pred  . UNSPECIFIED PERIPHERAL VASCULAR DISEASE   . OVERACTIVE BLADDER   . DYSLIPIDEMIA   . Glaucoma   . GERD   . Hx of colonoscopy   . HOH (hard of hearing)   . CAD (coronary artery disease)     RCA 40% stenosis cath 01/2011     Review of Systems  Constitutional: Negative for unexpected weight change.  HENT: Negative for facial swelling and neck stiffness.   Eyes: Negative for visual disturbance.  Respiratory: Negative for shortness of breath.   Cardiovascular: Negative for chest pain and palpitations.  Musculoskeletal: Negative for gait problem.  Neurological: Negative for facial asymmetry and light-headedness.       Objective:   Physical Exam  BP 128/72  Pulse 72  Temp(Src) 97.2 F (36.2 C) (Oral)  Wt 147 lb 1.9 oz (66.733 kg)  BMI 26.9 kg/m2  SpO2 98% Wt Readings from Last 3 Encounters:  12/19/12 147 lb 1.9 oz (66.733 kg)  10/08/12 148 lb 12.8 oz (67.495 kg)  08/28/12 152 lb 6.4 oz (69.128 kg)   Constitutional: She appears well-developed and well-nourished. No distress.   Neck: Normal range of motion. Neck supple. No JVD present. No thyromegaly present.  Cardiovascular: Normal rate, regular rhythm and normal heart sounds.  No murmur heard. No BLE edema. Pulmonary/Chest: Effort normal and breath sounds normal. No respiratory distress. She has no wheezes.  abdomen: SNTND, +BS Neurological: She is alert and oriented to person, place, and time. No cranial nerve deficit. Coordination, gait, balance, speech all normal.  Skin: Skin is warm and dry. No rash noted. No erythema.  Psychiatric: She has a normal mood and affect. Her behavior  is normal. Judgment and thought content normal.   Lab Results  Component Value Date   WBC 11.8* 08/12/2012   HGB 12.9 08/12/2012   HCT 39.1 08/12/2012   PLT 106* 08/12/2012   GLUCOSE 130* 08/12/2012   CHOL 185 09/05/2012   TRIG 174.0* 09/05/2012   HDL 46.70 09/05/2012   LDLDIRECT 138.1 01/23/2011   LDLCALC 104* 09/05/2012   ALT 13 08/12/2012   AST 19 08/12/2012   NA 139 08/12/2012   K 4.6 08/12/2012   CL 103 08/12/2012   CREATININE 1.01 08/12/2012   BUN 17 08/12/2012   CO2 28 08/12/2012   TSH 2.95 06/03/2012   INR 1.1* 02/16/2011   HGBA1C 5.9 06/03/2012   Lab Results  Component Value Date   VITAMINB12 467 06/03/2012       Assessment & Plan:   BLQ intermittent abdominal pain - exam and hx benign - consistent with IBS, hx same Stop PPI Check labs Reassurance - pt will call if worse  AMS "lapse" of memory - intermittent for years, not associated with focal neuro deficit or seizure - no syncope or other symptoms - reassurance, screen labs - call if worse

## 2012-12-19 NOTE — Progress Notes (Signed)
Subjective:    Patient ID: Tamara Robertson, female    DOB: 24-Dec-1929, 79 y.o.   MRN: [id number]  HPI  Patient presents today with variable indigestion, stomach pain, and loose stools x 6 months. She experiences episodes of these symptoms q 6 weeks, and each time the symptoms last about 3-4 days. Most recently, 1.5 weeks ago, the patient experienced an episode of indigestion and gas for which she took Protonix instead of her daily Zantac. This did help with the indigestion, and she continued to take the protonix x 2 more days, but then she began to experience loose, small stools 2-3 times a day x 3-4 days. This is a similar pattern with the other episodes, but she is unsure if she takes protonix each time. Her stools improved over the weekend, but she had another episode of the loose stools 3 days ago that is now improving. She has been having some intermittent lower abdominal pain and nausea for the past week. She denies blood in her stool, vomiting, fever, change in her diet, excessive stress, and change in medication. She does have GERD and a history of IBS, but she had a partial colectomy in 2006, after which her IBS improved. She has also had an appendectomy and hysterectomy, but no other abdominal surgery. Last colonoscopy was in 2006 and normal.  Patient also complains of problems with her mind going blank for a few seconds approximately monthly.  This has been occuring x 2-3 years. The episodes are random, except for that they always occur at home. She still has sensory perception during these episodes, and she has not lost consciousness or experienced significant memory loss. She has not lost her balance or fallen during these episodes. She has no warning or aura. She has had no focal weakness or motor deficit. She denies history of seizure disorder, stroke, or other neuro disorder. Her mother did have a stroke history.   Past Medical History  Diagnosis Date  . OSTEOARTHRITIS, KNEE, RIGHT   . URINARY  INCONTINENCE   . Irritable bowel syndrome   . DIVERTICULITIS, HX OF   . OSTEOPENIA   . Immune thrombocytopenic purpura     chronic - baseline 80-100K, on pred  . UNSPECIFIED PERIPHERAL VASCULAR DISEASE   . OVERACTIVE BLADDER   . DYSLIPIDEMIA   . Glaucoma   . GERD   . Hx of colonoscopy   . HOH (hard of hearing)   . CAD (coronary artery disease)     RCA 40% stenosis cath 01/2011   Family History  Problem Relation Age of Onset  . Coronary artery disease Mother   . Heart attack Mother 26  . Hyperlipidemia Mother   . Hypertension Mother   . Stomach cancer Father   . Hypertension Daughter   . Hyperlipidemia Daughter   . Arthritis      parent  . Transient ischemic attack      parent   History  Substance Use Topics  . Smoking status: Never Smoker   . 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 [place] Ham to [place] from [place] 05/2010 to be close to kids  . Alcohol Use: Yes     Comment: rarely    Review of Systems  Constitutional: Negative for activity change and unexpected weight change.  Eyes: Negative for visual disturbance.  Respiratory: Negative for shortness of breath.   Cardiovascular: Negative for chest pain.  Musculoskeletal: Negative for gait problem.  Skin: Negative for rash.  Neurological: Negative for syncope, speech difficulty, weakness and headaches.  Otherwise negative or as stated in HPI     Objective:   Physical Exam  Constitutional: She is oriented to person, place, and time. She appears well-developed and well-nourished.  HENT:  Head: Normocephalic and atraumatic.  Neck: Normal range of motion. Neck supple. No thyromegaly present.  Cardiovascular: Normal rate and regular rhythm.   Pulmonary/Chest: Breath sounds normal. She has no wheezes.  Abdominal: Soft. Bowel sounds are normal. She exhibits no distension.  Mild tenderness of lower abdomen and RUQ  Neurological: She is alert and  oriented to person, place, and time. No cranial nerve deficit. Coordination normal.  ROM and strength of all 4 extremities full and equal bilat Patient without focal neurological deficit  Skin: Skin is warm and dry.  Psychiatric: She has a normal mood and affect.   Lab Results  Component Value Date   WBC 11.8* 08/12/2012   HGB 12.9 08/12/2012   HCT 39.1 08/12/2012   PLT 106* 08/12/2012   GLUCOSE 130* 08/12/2012   CHOL 185 09/05/2012   TRIG 174.0* 09/05/2012   HDL 46.70 09/05/2012   LDLDIRECT 138.1 01/23/2011   LDLCALC 104* 09/05/2012   ALT 13 08/12/2012   AST 19 08/12/2012   NA 139 08/12/2012   K 4.6 08/12/2012   CL 103 08/12/2012   CREATININE 1.01 08/12/2012   BUN 17 08/12/2012   CO2 28 08/12/2012   TSH 2.95 06/03/2012   INR 1.1* 02/16/2011   HGBA1C 5.9 06/03/2012         Assessment & Plan:  1. Intermittent BLQ abdominal pain and alternating loose stools- Likely due to IBS, loose stools could be possible ADR of protonix. Will check screening labs today- CBC, BMP, LFTs. Will hold Protonix. Patient was provided reassurance and advised to RTC if any fever, blood in stool, severe abdominal pain, vomiting, or other significant worsening of symptoms.   2. Thought lapse, "Alt mental status"- Patient denies syncope, focal weakness, balance difficulties, gait abnormalities, memory loss, and change in the symptoms. Neuro exam intact, HD stable. Likely not due to neurological disorder. Reassurance is provided and patient advised to RTC if she experiences any of the above.  Concha Se, [place] Neighbor  I have personally reviewed this case with PA student. I also personally examined this patient. I agree with history and findings as documented above. I reviewed, discussed and approve of the assessment and plan as listed above. Rene Paci, MD

## 2012-12-30 ENCOUNTER — Encounter: Payer: Self-pay | Admitting: Internal Medicine

## 2012-12-30 DIAGNOSIS — R194 Change in bowel habit: Secondary | ICD-10-CM

## 2012-12-31 ENCOUNTER — Encounter: Payer: Self-pay | Admitting: Internal Medicine

## 2013-01-28 ENCOUNTER — Encounter: Payer: Self-pay | Admitting: Internal Medicine

## 2013-01-29 ENCOUNTER — Other Ambulatory Visit (INDEPENDENT_AMBULATORY_CARE_PROVIDER_SITE_OTHER): Payer: Medicare Other

## 2013-01-29 ENCOUNTER — Encounter: Payer: Self-pay | Admitting: Internal Medicine

## 2013-01-29 ENCOUNTER — Ambulatory Visit (INDEPENDENT_AMBULATORY_CARE_PROVIDER_SITE_OTHER): Payer: Medicare Other | Admitting: Internal Medicine

## 2013-01-29 VITALS — BP 154/70 | HR 70 | Ht 62.5 in | Wt 151.0 lb

## 2013-01-29 DIAGNOSIS — K5900 Constipation, unspecified: Secondary | ICD-10-CM

## 2013-01-29 DIAGNOSIS — R1031 Right lower quadrant pain: Secondary | ICD-10-CM

## 2013-01-29 DIAGNOSIS — Z8719 Personal history of other diseases of the digestive system: Secondary | ICD-10-CM

## 2013-01-29 DIAGNOSIS — K219 Gastro-esophageal reflux disease without esophagitis: Secondary | ICD-10-CM

## 2013-01-29 DIAGNOSIS — D693 Immune thrombocytopenic purpura: Secondary | ICD-10-CM

## 2013-01-29 DIAGNOSIS — K589 Irritable bowel syndrome without diarrhea: Secondary | ICD-10-CM

## 2013-01-29 LAB — BASIC METABOLIC PANEL
BUN: 17 mg/dL (ref 6–23)
CO2: 30 mEq/L (ref 19–32)
Calcium: 9.6 mg/dL (ref 8.4–10.5)
Creatinine, Ser: 0.8 mg/dL (ref 0.4–1.2)

## 2013-01-29 LAB — CBC
MCHC: 33.7 g/dL (ref 30.0–36.0)
MCV: 85.8 fl (ref 78.0–100.0)
RBC: 4.84 Mil/uL (ref 3.87–5.11)
RDW: 13.1 % (ref 11.5–14.6)

## 2013-01-29 MED ORDER — RANITIDINE HCL 150 MG PO TABS: 150.0000 mg | ORAL_TABLET | ORAL | Status: DC | PRN

## 2013-01-29 MED ORDER — PREDNISONE 50 MG PO TABS: ORAL_TABLET | ORAL | Status: DC

## 2013-01-29 MED ORDER — OMEPRAZOLE 20 MG PO CPDR: 20.0000 mg | DELAYED_RELEASE_CAPSULE | Freq: Every day | ORAL | Status: DC

## 2013-01-29 MED ORDER — LINACLOTIDE 145 MCG PO CAPS: 145.0000 ug | ORAL_CAPSULE | Freq: Every day | ORAL | Status: DC

## 2013-01-29 NOTE — Progress Notes (Signed)
Patient ID: Tamara Robertson, female   DOB: Dec 10, 1929, 79 y.o.   MRN: [id number] HPI: Tamara Robertson is an 79 -year-old female with a past medical history of long-standing IBS (she says 50 years), history of diverticulitis requiring sigmoidectomy in 2007, peripheral vascular disease, history of ITP status post splenectomy, GERD, hyperlipidemia, and CAD who is seen in consultation at the request of Dr. Felicity Robertson for evaluation of intermittent abdominal pain and reflux. The patient is here alone today. She reports she has a long history of irritable bowel syndrome and quite significant constipation. She reports this constipation has been treated most recently using daily MiraLax 17 g and docusate 100 mg daily. Previous to this she was trying Citrucel. She reports she is having a bowel movement on most days, and can have as many as 2 stools daily. They are soft and brown. Occasionally she reports "finger size stool" occurring every 3 weeks. She denies hematochezia or melena. Occasionally she does see scant bright red blood with wiping.  She reports intermittent right-sided abdominal pain most noticeable in the right lower quadrant. He can extend across her lower abdomen. She frequently associates this with gas and bloating. She was previously taking a probiotic, but it present is not. She does report daily heartburn without dysphagia or odynophagia. No weight loss. No nausea or vomiting. Heartburn is worse at night. She was taken pantoprazole 40 mg daily, but this seemed to change her stooling and so it was changed to ranitidine 150 mg twice a day. She has noticed that the ranitidine is not as effective. She recalls several prior colonoscopy, the last being in 2007 performed in [place].  She recalls as being normal.  Past Medical History  Diagnosis Date  . OSTEOARTHRITIS, KNEE, RIGHT   . URINARY INCONTINENCE   . Irritable bowel syndrome   . DIVERTICULITIS, HX OF   . OSTEOPENIA   . Immune thrombocytopenic  purpura     chronic - baseline 80-100K, on pred  . UNSPECIFIED PERIPHERAL VASCULAR DISEASE   . OVERACTIVE BLADDER   . DYSLIPIDEMIA   . Glaucoma   . GERD   . HOH (hard of hearing)   . CAD (coronary artery disease)     RCA 40% stenosis cath 01/2011    Past Surgical History  Procedure Laterality Date  . Splenectomy  1954  . Abdominal hysterectomy  1963  . L pop pta  10/2009  . Laparoscopic sigmoid colectomy  10/2005  . Cataract extraction, bilateral  10/2010  . Knee arthroscopy Right   . Angioplasty    . Cardiac catheterization      Current Outpatient Prescriptions  Medication Sig Dispense Refill  . Biotin 2500 MCG CAPS Take by mouth daily.      . Calcium-Magnesium-Vitamin D (CITRACAL CALCIUM+D) 600-40-500 MG-MG-UNIT TB24 Take 1 tablet by mouth daily.       . dorzolamide (TRUSOPT) 2 % ophthalmic solution Place 1 drop into both eyes 2 (two) times daily.      . Flaxseed MISC by Does not apply route. Take 1 tsp daily      . Magnesium 250 MG TABS Take 250 mg by mouth daily.      . Multiple Vitamin (MULTIVITAMIN) tablet Take 1 tablet by mouth daily.        . pravastatin (PRAVACHOL) 40 MG tablet Take 1 tablet (40 mg total) by mouth every evening.  90 tablet  3  . predniSONE (DELTASONE) 2.5 MG tablet Take 1 tablet (2.5 mg total) by mouth  daily.  90 tablet  1  . Probiotic Product (PROBIOTIC FORMULA PO) Take 1 tablet by mouth daily.        . ranitidine (ZANTAC) 150 MG tablet Take 1 tablet (150 mg total) by mouth as needed for heartburn.  30 tablet  2  . zolpidem (AMBIEN) 10 MG tablet Take 5-10 mg by mouth at bedtime as needed.      . Linaclotide (LINZESS) 145 MCG CAPS capsule Take 1 capsule (145 mcg total) by mouth daily.  30 capsule  6  . omeprazole (PRILOSEC) 20 MG capsule Take 1 capsule (20 mg total) by mouth daily.  90 capsule  3  . predniSONE (DELTASONE) 50 MG tablet To take prior to CT due to allergic reaction to contrast  5 tablet  0   No current facility-administered medications for  this visit.    Allergies  Allergen Reactions  . Aspirin Other (See Comments)    ITP  . Contrast Media [Iodinated Diagnostic Agents]     hives    Family History  Problem Relation Age of Onset  . Coronary artery disease Mother   . Heart attack Mother 69  . Hyperlipidemia Mother   . Hypertension Mother   . Stomach cancer Father   . Hypertension Daughter   . Hyperlipidemia Daughter   . Arthritis      parent  . Transient ischemic attack      parent  . Colon cancer Neg Hx     History  Substance Use Topics  . Smoking status: Never Smoker   . 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 Alcohol Use: Yes     Comment: rarely    ROS: As per history of present illness, otherwise negative  BP 154/70  Pulse 70  Ht 5' 2.5" (1.588 m)  Wt 151 lb (68.493 kg)  BMI 27.16 kg/m2 Constitutional: Well-developed and well-nourished. No distress. HEENT: Normocephalic and atraumatic. Oropharynx is clear and moist. No oropharyngeal exudate. Conjunctivae are normal.  No scleral icterus. Neck: Neck supple. Trachea midline. Cardiovascular: Normal rate, regular rhythm and intact distal pulses.  Pulmonary/chest: Effort normal and breath sounds normal. No wheezing, rales or rhonchi. Abdominal: Soft, mild right sided abdominal discomfort most pronounced in the lower quadrant and without rebound or guarding, nondistended. Bowel sounds active throughout.  Extremities: no clubbing, cyanosis, or edema Lymphadenopathy: No cervical adenopathy noted. Neurological: Alert and oriented to person place and time. Skin: Skin is warm and dry. No rashes noted. Psychiatric: Normal mood and affect. Behavior is normal.  RELEVANT LABS AND IMAGING: CBC    Component Value Date/Time   WBC 12.3* 01/29/2013 0954   WBC 11.8* 08/12/2012 1438   RBC 4.84 01/29/2013 0954   RBC 4.60 08/12/2012 1438   HGB 14.0  01/29/2013 0954   HGB 12.9 08/12/2012 1438   HCT 41.5 01/29/2013 0954   HCT 39.1 08/12/2012 1438   PLT 114.0* 01/29/2013 0954   PLT 106* 08/12/2012 1438   MCV 85.8 01/29/2013 0954   MCV 85.0 08/12/2012 1438   MCH 28.0 08/12/2012 1438   MCHC 33.7 01/29/2013 0954   MCHC 33.0 08/12/2012 1438   RDW 13.1 01/29/2013 0954   RDW 14.2 08/12/2012 1438   LYMPHSABS 3.8 12/19/2012 1154   LYMPHSABS 3.6* 08/12/2012 1438   MONOABS 1.4* 12/19/2012 1154   MONOABS 0.6 08/12/2012 1438   EOSABS 0.3 12/19/2012 1154  EOSABS 0.5 08/12/2012 1438   BASOSABS 0.0 12/19/2012 1154   BASOSABS 0.0 08/12/2012 1438    CMP     Component Value Date/Time   NA 142 01/29/2013 0954   NA 144 04/17/2012 1452   K 4.8 01/29/2013 0954   K 4.7 04/17/2012 1452   CL 106 01/29/2013 0954   CL 105 04/17/2012 1452   CO2 30 01/29/2013 0954   CO2 31* 04/17/2012 1452   GLUCOSE 82 01/29/2013 0954   GLUCOSE 122* 04/17/2012 1452   BUN 17 01/29/2013 0954   BUN 16.0 04/17/2012 1452   CREATININE 0.8 01/29/2013 0954   CREATININE 0.9 04/17/2012 1452   CALCIUM 9.6 01/29/2013 0954   CALCIUM 9.7 04/17/2012 1452   PROT 7.3 12/19/2012 1154   PROT 7.1 04/17/2012 1452   ALBUMIN 4.1 12/19/2012 1154   ALBUMIN 3.7 04/17/2012 1452   AST 21 12/19/2012 1154   AST 23 04/17/2012 1452   ALT 16 12/19/2012 1154   ALT 18 04/17/2012 1452   ALKPHOS 72 12/19/2012 1154   ALKPHOS 91 04/17/2012 1452   BILITOT 0.6 12/19/2012 1154   BILITOT 0.33 04/17/2012 1452   GFRNONAA >60 01/27/2011 1200   GFRAA >60 01/27/2011 1200    ASSESSMENT/PLAN:  60 -year-old female with a past medical history of long-standing IBS (she says 50 years), history of diverticulitis requiring sigmoidectomy in 2007, peripheral vascular disease, history of ITP status post splenectomy, GERD, hyperlipidemia, and CAD who is seen in consultation at the request of Dr. Felicity Robertson for evaluation of intermittent abdominal pain and reflux.  1.  Right lower quadrant abdominal pain/constipation/IBS -- her right lower quadrant  abdominal pain may be secondary to incompletely treated constipation, but given her history I have recommended an abdominal CT scan to exclude diverticulitis and other right-sided pathology. I do not think that she is getting complete evacuation with MiraLax, and therefore I am discontinuing MiraLax and Colace in favor of Linzess 145 mcg daily.  I would like to see her back in 4-6 weeks' time to reassess her symptoms. She understands and is happy with this plan.  2.  GERD -- incompletely controlled without alarm symptoms. She was doing better on PPI but developed change in her bowel habits. I will add omeprazole 20 mg once daily 30 minutes to one hour before breakfast. She can still use ranitidine 150 mg in the evening or at night for breakthrough symptoms if necessary. She is asked to notify me if she doesn't tolerate the omeprazole in any way. She voices understanding.  Return in 4-6 weeks, sooner if necessary

## 2013-01-29 NOTE — Patient Instructions (Addendum)
You have been scheduled for a CT scan of the abdomen and pelvis at [facility] ([address]---this is in the same building as [facility]).   You are scheduled on 02/02/2013 at 9:30. You should arrive 15 minutes prior to your appointment time for registration. Please follow the written instructions below on the day of your exam:  WARNING: IF YOU ARE ALLERGIC TO IODINE/X-RAY DYE, PLEASE NOTIFY RADIOLOGY IMMEDIATELY AT [phone]! YOU WILL BE GIVEN A 13 HOUR PREMEDICATION PREP.  1) Do not eat or drink anything after 5:30 (4 hours prior to your test) 2) You have been given 2 bottles of oral contrast to drink. The solution may taste  better if refrigerated, but do NOT add ice or any other liquid to this solution. Shake  well before drinking.    Drink 1 bottle of contrast @ 7:30 (2 hours prior to your exam)  Drink 1 bottle of contrast @ 8:30 (1 hour prior to your exam)  You may take any medications as prescribed with a small amount of water except for the following: Metformin, Glucophage, Glucovance, Avandamet, Riomet, Fortamet, Actoplus Met, Janumet, Glumetza or Metaglip. The above medications must be held the day of the exam AND 48 hours after the exam.  The purpose of you drinking the oral contrast is to aid in the visualization of your intestinal tract. The contrast solution may cause some diarrhea. Before your exam is started, you will be given a small amount of fluid to drink. Depending on your individual set of symptoms, you may also receive an intravenous injection of x-ray contrast/dye. Plan on being at [facility] for 30 minutes or long, depending on the type of exam you are having performed.  If you have any questions regarding your exam or if you need to reschedule, you may call the CT department at [phone] between the hours of 8:00 am and 5:00 pm, Monday-Friday.  ________________________________________________________________________  Your physician  has requested that you go to the basement for the following lab work before leaving today:BMET, CBC  We have sent the following medications to your pharmacy for you to pick up at your convenience:  Omeprazole 20mg  in the morning. Linzess, take 1 capsule in the morning with food. Stop taking prilosec, take Zantac as needed for breakthrough heartburn.   Stop taking miralax, and Colace.  For contrast allergy; prednisone 50mg  at 13 hours prior 7 hours prior and 1 hour prior to your scan.  Take benedryl 50 mg 1 hour prior to your scan

## 2013-01-30 ENCOUNTER — Telehealth: Payer: Self-pay | Admitting: *Deleted

## 2013-01-30 ENCOUNTER — Encounter: Payer: Self-pay | Admitting: *Deleted

## 2013-01-30 MED ORDER — METHYLPREDNISOLONE 32 MG PO TABS: ORAL_TABLET | ORAL | Status: DC

## 2013-01-30 NOTE — Telephone Encounter (Signed)
error 

## 2013-01-30 NOTE — Telephone Encounter (Signed)
Rose at CT states pt does not want to take the amount of Prednisone ordered; she will take 32mg  of Methylprednisolone. reordered

## 2013-02-02 ENCOUNTER — Ambulatory Visit (INDEPENDENT_AMBULATORY_CARE_PROVIDER_SITE_OTHER)
Admission: RE | Admit: 2013-02-02 | Discharge: 2013-02-02 | Disposition: A | Discharge status: Home / Self Care | Payer: Medicare Other | Source: Ambulatory Visit | Attending: Internal Medicine | Admitting: Internal Medicine

## 2013-02-02 DIAGNOSIS — K5900 Constipation, unspecified: Secondary | ICD-10-CM

## 2013-02-02 DIAGNOSIS — R1031 Right lower quadrant pain: Secondary | ICD-10-CM

## 2013-02-02 DIAGNOSIS — Z8719 Personal history of other diseases of the digestive system: Secondary | ICD-10-CM

## 2013-02-02 DIAGNOSIS — K219 Gastro-esophageal reflux disease without esophagitis: Secondary | ICD-10-CM

## 2013-02-02 MED ORDER — IOHEXOL 300 MG/ML  SOLN
80.0000 mL | Freq: Once | INTRAMUSCULAR | Status: AC | PRN
  Administered 2013-02-02: 80 mL via INTRAVENOUS

## 2013-02-03 ENCOUNTER — Telehealth: Payer: Self-pay | Admitting: Internal Medicine

## 2013-02-03 MED ORDER — LUBIPROSTONE 8 MCG PO CAPS: 8.0000 ug | ORAL_CAPSULE | Freq: Two times a day (BID) | ORAL | Status: DC

## 2013-02-03 NOTE — Telephone Encounter (Signed)
Informed pt she may try Amitiza, but we will give her samples. Pt stated understanding.

## 2013-02-03 NOTE — Telephone Encounter (Signed)
Trial of Amitiza 8 mcg twice daily (call for abd pain, diarrhea, nonresponse)

## 2013-02-03 NOTE — Telephone Encounter (Signed)
Bowel is unremarkable, no evidence of inflammation or diverticulitis. Nothing found to explain RLQ pain, hopefully new laxative will help with symptoms 2.8 cm L ovarian cyst, would refer to GYN (she may have one already) to discuss further eval as deemed necessary She has planned follow-up   Informed pt of results as noted above. I will send the CT report to Dr Seymour Bars her GYN. She is scheduled for a f/u on 02/24/13. Pt reports she cannot afford the co pay for Linzess and she reports she did not like the results, explosive diarrhea. She states in the past she took Miralax and a stool softener, but Dr Rhea Belton told her to stop. Pt reports she is constipated now and needs something. Please advise thanks.

## 2013-02-04 NOTE — Telephone Encounter (Signed)
See telephone encounter from 02/03/13

## 2013-02-11 ENCOUNTER — Telehealth: Payer: Self-pay | Admitting: Oncology

## 2013-02-11 ENCOUNTER — Other Ambulatory Visit (HOSPITAL_BASED_OUTPATIENT_CLINIC_OR_DEPARTMENT_OTHER): Payer: Medicare Other | Admitting: Lab

## 2013-02-11 ENCOUNTER — Ambulatory Visit (HOSPITAL_BASED_OUTPATIENT_CLINIC_OR_DEPARTMENT_OTHER): Payer: Medicare Other | Admitting: Oncology

## 2013-02-11 VITALS — BP 153/62 | HR 70 | Temp 97.2°F | Resp 20 | Ht 62.5 in | Wt 149.8 lb

## 2013-02-11 DIAGNOSIS — D693 Immune thrombocytopenic purpura: Secondary | ICD-10-CM

## 2013-02-11 LAB — COMPREHENSIVE METABOLIC PANEL (CC13)
AST: 19 U/L (ref 5–34)
Albumin: 3.5 g/dL (ref 3.5–5.0)
Alkaline Phosphatase: 80 U/L (ref 40–150)
BUN: 16.7 mg/dL (ref 7.0–26.0)
CO2: 28 mEq/L (ref 22–29)
Calcium: 9.3 mg/dL (ref 8.4–10.4)
Creatinine: 0.8 mg/dL (ref 0.6–1.1)
Potassium: 4.3 mEq/L (ref 3.5–5.1)
Sodium: 143 mEq/L (ref 136–145)
Total Bilirubin: 0.44 mg/dL (ref 0.20–1.20)

## 2013-02-11 LAB — CBC WITH DIFFERENTIAL/PLATELET
Basophils Absolute: 0.1 10*3/uL (ref 0.0–0.1)
EOS%: 2.2 % (ref 0.0–7.0)
LYMPH%: 28.9 % (ref 14.0–49.7)
MCH: 28.4 pg (ref 25.1–34.0)
MCV: 85.8 fL (ref 79.5–101.0)
MONO#: 1.6 10*3/uL — ABNORMAL HIGH (ref 0.1–0.9)
MONO%: 13.7 % (ref 0.0–14.0)
Platelets: 123 10*3/uL — ABNORMAL LOW (ref 145–400)
RBC: 4.57 10*6/uL (ref 3.70–5.45)
RDW: 13.6 % (ref 11.2–14.5)

## 2013-02-11 NOTE — Progress Notes (Signed)
Hematology and Oncology Follow Up Visit  Tamara Robertson [id number] 12-09-1929 79 y.o. 02/11/2013 10:29 AM   Principle Diagnosis: 79 year old with chronic, relapsing ITP diagnosed in the 51s.   Prior Therapy: Patient S/P splenectomy and subsequently treated with high doses of steroids. The patient have had a complete response to steroids back in the 60s and all the way have had a few relapses. Every time she has a relapse she gets restarted on high-dose of steroids and she achieved a complete response. The most recent of relapses before her move to [facility] she was hospitalized for a platelet count of 8000 around the year 2000.  Current therapy: Prednisone to 2.5 mg every other day  Interim History: Mrs. Tamara Robertson returns today for a follow up visit. Since her last visit, she has been doing well. No new complaints. She is completely asymptomatic at this point. She she is not reporting any bleeding, easy bruisability, or any skin changes. She has not reported any hematochezia or melena she has not reported any epistaxis or hemoptysis. She still on the same dose of prednisone.    Medications: I have reviewed the patient's current medications.  Current Outpatient Prescriptions  Medication Sig Dispense Refill  . Biotin 2500 MCG CAPS Take by mouth daily.      . Calcium-Magnesium-Vitamin D (CITRACAL CALCIUM+D) 600-40-500 MG-MG-UNIT TB24 Take 1 tablet by mouth daily.       . dorzolamide (TRUSOPT) 2 % ophthalmic solution Place 1 drop into both eyes 2 (two) times daily.      . dorzolamide-timolol (COSOPT) 22.3-6.8 MG/ML ophthalmic solution       . Flaxseed MISC by Does not apply route. Take 1 tsp daily      . Magnesium 250 MG TABS Take 250 mg by mouth daily.      . Multiple Vitamin (MULTIVITAMIN) tablet Take 1 tablet by mouth daily.        [name] omeprazole (PRILOSEC) 20 MG capsule Take 1 capsule (20 mg total) by mouth daily.  90 capsule  3  . pravastatin (PRAVACHOL) 40 MG tablet Take 1 tablet (40 mg total) by  mouth every evening.  90 tablet  3  . predniSONE (DELTASONE) 2.5 MG tablet Take 1 tablet (2.5 mg total) by mouth daily.  90 tablet  1  . predniSONE (DELTASONE) 50 MG tablet To take prior to CT due to allergic reaction to contrast  5 tablet  0  . Probiotic Product (PROBIOTIC FORMULA PO) Take 1 tablet by mouth daily.        . ranitidine (ZANTAC) 150 MG tablet Take 1 tablet (150 mg total) by mouth as needed for heartburn.  30 tablet  2   No current facility-administered medications for this visit.    Allergies:  Allergies  Allergen Reactions  . Aspirin Other (See Comments)    ITP  . Contrast Media [Iodinated Diagnostic Agents]     hives    Past Medical History, Surgical history, Social history, and Family History were reviewed and updated.  Review of Systems:  Remaining ROS negative. Physical Exam: Blood pressure 153/62, pulse 70, temperature 97.2 F (36.2 C), temperature source Oral, resp. rate 20, height 5' 2.5" (1.588 m), weight 149 lb 12.8 oz (67.949 kg). ECOG: 1 General appearance: alert Head: Normocephalic, without obvious abnormality, atraumatic Neck: no adenopathy, no carotid bruit, no JVD, supple, symmetrical, trachea midline and thyroid not enlarged, symmetric, no tenderness/mass/nodules Lymph nodes: Cervical, supraclavicular, and axillary nodes normal. Heart:regular rate and rhythm, S1, S2 normal, no  murmur, click, rub or gallop Lung:chest clear, no wheezing, rales, normal symmetric air entry Abdomin: soft, non-tender, without masses or organomegaly EXT:no erythema, induration, or nodules   Lab Results: Lab Results  Component Value Date   WBC 12.3* 01/29/2013   HGB 14.0 01/29/2013   HCT 41.5 01/29/2013   MCV 85.8 01/29/2013   PLT 114.0* 01/29/2013     Chemistry      Component Value Date/Time   NA 142 01/29/2013 0954   NA 144 04/17/2012 1452   K 4.8 01/29/2013 0954   K 4.7 04/17/2012 1452   CL 106 01/29/2013 0954   CL 105 04/17/2012 1452   CO2 30 01/29/2013 0954    CO2 31* 04/17/2012 1452   BUN 17 01/29/2013 0954   BUN 16.0 04/17/2012 1452   CREATININE 0.8 01/29/2013 0954   CREATININE 0.9 04/17/2012 1452      Component Value Date/Time   CALCIUM 9.6 01/29/2013 0954   CALCIUM 9.7 04/17/2012 1452   ALKPHOS 72 12/19/2012 1154   ALKPHOS 91 04/17/2012 1452   AST 21 12/19/2012 1154   AST 23 04/17/2012 1452   ALT 16 12/19/2012 1154   ALT 18 04/17/2012 1452   BILITOT 0.6 12/19/2012 1154   BILITOT 0.33 04/17/2012 1452      Impression and Plan:   79 year old female with the following issues:  Chronic relapsing ITP: She had multiple relapses in the past that is highly responsive to prednisone. She is currently on low-dose maintenance which seems to be effective in the limiting her relapses. She is not at risk of bleeding at this time. For the time being I will keep her on low-dose of steroids as this seems to be well tolerated at this time. She does not appear to have significant mineral bone loss or any evidence to suggest diabetes.   I will recheck her counts in 6 months. If she has further drop below 50 K, I will treat her with high dose steroids at that time.         Jayke Caul, MD 10/8/201410:29 AM

## 2013-02-11 NOTE — Telephone Encounter (Signed)
Gave pt appt for lab and MD  for April 2015 °

## 2013-02-12 ENCOUNTER — Telehealth: Payer: Self-pay | Admitting: Internal Medicine

## 2013-02-12 NOTE — Telephone Encounter (Signed)
Pt reports bloating and more frequent headaches on Omeprazole and Amitiza, burning when she urinates, and a "spacey feeling". She did not like the Linzess and states the Amitiza isn't very effective, so she has restarted Miralax about every 3 days. She will also stop Omeprazole and restart Zantac every 3 days. When she comes for her F/U, she will start all over again she states.f/u 02/24/13. [name]

## 2013-02-16 ENCOUNTER — Telehealth: Payer: Self-pay | Admitting: Internal Medicine

## 2013-02-16 NOTE — Telephone Encounter (Signed)
Pt wanted to r/s for an earlier appt, but nothing is available. She wanted to go out of town the day she is scheduled.

## 2013-02-23 ENCOUNTER — Encounter: Payer: Self-pay | Admitting: Internal Medicine

## 2013-02-24 ENCOUNTER — Ambulatory Visit (INDEPENDENT_AMBULATORY_CARE_PROVIDER_SITE_OTHER): Payer: Medicare Other | Admitting: Internal Medicine

## 2013-02-24 ENCOUNTER — Encounter: Payer: Self-pay | Admitting: Internal Medicine

## 2013-02-24 VITALS — BP 146/72 | HR 80 | Ht 62.5 in | Wt 150.0 lb

## 2013-02-24 DIAGNOSIS — R1013 Epigastric pain: Secondary | ICD-10-CM

## 2013-02-24 DIAGNOSIS — N83202 Unspecified ovarian cyst, left side: Secondary | ICD-10-CM

## 2013-02-24 DIAGNOSIS — K5900 Constipation, unspecified: Secondary | ICD-10-CM

## 2013-02-24 DIAGNOSIS — N83209 Unspecified ovarian cyst, unspecified side: Secondary | ICD-10-CM

## 2013-02-24 DIAGNOSIS — K219 Gastro-esophageal reflux disease without esophagitis: Secondary | ICD-10-CM

## 2013-02-24 NOTE — Progress Notes (Signed)
Subjective:    Patient ID: Tamara Robertson, female    DOB: 20-Sep-1929, 79 y.o.   MRN: [id number]  HPI [name] is an 79 yo female with PMH of long-standing IBS (she says 50 years), history of diverticulitis requiring sigmoidectomy in 2007, peripheral vascular disease, history of ITP status post splenectomy, GERD, hyperlipidemia, and CAD who is seen in followup. She was seen about 4 weeks ago to evaluate intermittent abdominal pain and reflux. She is here alone today.  After her last visit she was tried on Linzess 145 mcg daily but discontinued this due to diarrhea. This was then changed to lubiprostone 8 mcg twice a day which provided no benefit or constipation. She reports she took it for 3 or 4 days without bowel movement. It was then discontinued and she went back on MiraLax which she is using about every second or third day. She is using Citrucel 2-3 days a week as well. Her constipation has improved significantly and she is having normal formed bowel movements about once daily. She has not seen any rectal bleeding or melena recently. She did try Prilosec for her heartburn but discontinued it when she developed significant bloating and headache. She went back on Zantac every morning and evening and it is helping for the most part. Occasionally she will need to use TUMS around 11 PM. She recalls previously that she tried pantoprazole which helped significantly, but she stopped it because she was unsure how long it was safe to use. She reports episodic left upper abdominal pain which seems to be worse with eating. This can occur 2 or so times a day and is very fleeting. She reports it feels "like a sharp pins" and lasts only for a few seconds. Otherwise no other abdominal pain.  She did have a CT of the abdomen and pelvis performed after her last office visit. This was largely unremarkable but did show a left ovarian cystic lesion. She has had this evaluated by GYN and has been referred to GYN oncology for  further assessment. She reports her CA 125 was normal but another tumor marker was slightly elevated.  Review of Systems As per history of present illness, otherwise negative  Current Medications, Allergies, Past Medical History, Past Surgical History, Family History and Social History were reviewed in [employer] record.     Objective:   Physical Exam BP 146/72  Pulse 80  Ht 5' 2.5" (1.588 m)  Wt 150 lb (68.04 kg)  BMI 26.98 kg/m2 Constitutional: Well-developed and well-nourished. No distress. HEENT: Normocephalic and atraumatic. Oropharynx is clear and moist. No oropharyngeal exudate. Conjunctivae are normal.  No scleral icterus. Cardiovascular: Normal rate, regular rhythm and intact distal pulses.  Pulmonary/chest: Effort normal and breath sounds normal. No wheezing, rales or rhonchi. Abdominal: Soft, nontender, nondistended. Bowel sounds active throughout. Small circular pen mark in the left upper quadrant over her splenectomy scar where she marked her fleeting abdominal pain mentioned in the history of present illness Extremities: no clubbing, cyanosis, or edema Neurological: Alert and oriented to person place and time. Skin: Skin is warm and dry. No rashes noted. Psychiatric: Normal mood and affect. Behavior is normal.  CT ABDOMEN AND PELVIS WITH CONTRAST - Sept 2014   TECHNIQUE: Multidetector CT imaging of the abdomen and pelvis was performed using the standard protocol following bolus administration of intravenous contrast.   CONTRAST:  80mL OMNIPAQUE IOHEXOL 300 MG/ML  SOLN   COMPARISON:  None.   FINDINGS: Lung bases are clear.  8 mm hypervascular lesion in the inferior right hepatic lobe (series 2/ image 35), likely reflecting a flash filling hemangioma.   Prior splenectomy.   Pancreas and adrenal glands are within normal limits.   Gallbladder is unremarkable. No intrahepatic or extrahepatic ductal dilatation.   Right renal cysts,  including a 2.7 cm anterior upper pole cyst (series 5/ image 16) and a 1.5 cm posterior lower pole cyst (series 5/ image 7). Left kidney is within normal limits. No hydronephrosis.   No evidence of bowel obstruction. Appendix is not discretely visualized and may be surgically absent. Prior sigmoid resection with anastomosis in the left pelvis (series 2/image 68).   Atherosclerotic calcifications of the abdominal aorta and branch vessels.   No abdominopelvic ascites.   No suspicious abdominopelvic lymphadenopathy.   Status post hysterectomy. 2.8 cm left ovarian cyst (series 2/ image 61). No right adnexal mass.   Mild prominence of the mid right ureter (series 2/image 53), although smoothly tapering distally, favored to reflect physiologic laxity.   Bladder is within normal limits.   Degenerative changes of the visualized thoracolumbar spine.   IMPRESSION: No evidence of bowel obstruction.   Prior splenectomy, sigmoidectomy, and hysterectomy. Suspected prior appendectomy.   No CT findings to account for the patient's right lower quadrant abdominal pain.   8 mm hypervascular lesion in the inferior right hepatic lobe, likely reflecting a flash filling hemangioma.   2.8 cm left ovarian cyst. Given postmenopausal status, follow-up pelvic ultrasound is suggested in 1 year.   CBC    Component Value Date/Time   WBC 11.3* 02/11/2013 1000   WBC 12.3* 01/29/2013 0954   RBC 4.57 02/11/2013 1000   RBC 4.84 01/29/2013 0954   HGB 13.0 02/11/2013 1000   HGB 14.0 01/29/2013 0954   HCT 39.2 02/11/2013 1000   HCT 41.5 01/29/2013 0954   PLT 123* 02/11/2013 1000   PLT 114.0* 01/29/2013 0954   MCV 85.8 02/11/2013 1000   MCV 85.8 01/29/2013 0954   MCH 28.4 02/11/2013 1000   MCHC 33.1 02/11/2013 1000   MCHC 33.7 01/29/2013 0954   RDW 13.6 02/11/2013 1000   RDW 13.1 01/29/2013 0954   LYMPHSABS 3.3 02/11/2013 1000   LYMPHSABS 3.8 12/19/2012 1154   MONOABS 1.6* 02/11/2013 1000   MONOABS 1.4*  12/19/2012 1154   EOSABS 0.2 02/11/2013 1000   EOSABS 0.3 12/19/2012 1154   BASOSABS 0.1 02/11/2013 1000   BASOSABS 0.0 12/19/2012 1154    CMP     Component Value Date/Time   NA 143 02/11/2013 1001   NA 142 01/29/2013 0954   K 4.3 02/11/2013 1001   K 4.8 01/29/2013 0954   CL 106 01/29/2013 0954   CL 105 04/17/2012 1452   CO2 28 02/11/2013 1001   CO2 30 01/29/2013 0954   GLUCOSE 82 02/11/2013 1001   GLUCOSE 82 01/29/2013 0954   GLUCOSE 122* 04/17/2012 1452   BUN 16.7 02/11/2013 1001   BUN 17 01/29/2013 0954   CREATININE 0.8 02/11/2013 1001   CREATININE 0.8 01/29/2013 0954   CALCIUM 9.3 02/11/2013 1001   CALCIUM 9.6 01/29/2013 0954   PROT 6.8 02/11/2013 1001   PROT 7.3 12/19/2012 1154   ALBUMIN 3.5 02/11/2013 1001   ALBUMIN 4.1 12/19/2012 1154   AST 19 02/11/2013 1001   AST 21 12/19/2012 1154   ALT 15 02/11/2013 1001   ALT 16 12/19/2012 1154   ALKPHOS 80 02/11/2013 1001   ALKPHOS 72 12/19/2012 1154   BILITOT 0.44 02/11/2013 1001   BILITOT  0.6 12/19/2012 1154   GFRNONAA >60 01/27/2011 1200   GFRAA >60 01/27/2011 1200      Assessment & Plan:  79 yo female with PMH of long-standing IBS (she says 50 years), history of diverticulitis requiring sigmoidectomy in 2007, peripheral vascular disease, history of ITP status post splenectomy, GERD, hyperlipidemia, and CAD who is seen in followup.  1.  Constipation -- she did not have the appropriate response to either Linzess or lubiprostone. Both have been discontinued and she has returned to MiraLax 17 g every 2-3 days with Citrucel 2-3 times a week. She is happy with her current bowel habits and so we will continue with these medicines for now. She is happy with this plan  2.  GERD/upper abd pain -- she does have breakthrough heartburn symptoms in the evening when using Zantac around 7 AM and 7 PM. She seems to recall a favorable response to pantoprazole, though she did not tolerate omeprazole. We will have her give pantoprazole 40 mg daily another trial. She can  still use Zantac as needed for breakthrough symptoms. Her upper abdominal pain etiology is unclear. Perhaps this is acid peptic in nature. If so pantoprazole should help. The upper abdomen was unremarkable on recent CT imaging. If his pain worsens or changes in any way Avastin she notify me, and she voices understanding.  3.  Ovarian cyst in a postmenopausal female -- she is seeing GYN oncology in the coming weeks.  I will see her back in 3 months, sooner if needed.

## 2013-02-24 NOTE — Patient Instructions (Signed)
Discontinue taking Linzess and Amitiza.  Continue to take Miralax and Citracel, as you have been.  Continue taking pantorprazole daily, please call us when you need a refill. And You can take Zantac as needed for breakthrough heartburn.  Follow up with Dr. Rhea Belton in 3 months                                               We are excited to introduce MyChart, a new best-in-class service that provides you online access to important information in your electronic medical record. We want to make it easier for you to view your health information - all in one secure location - when and where you need it. We expect MyChart will enhance the quality of care and service we provide.  When you register for MyChart, you can:    View your test results.    Request appointments and receive appointment reminders via email.    Request medication renewals.    View your medical history, allergies, medications and immunizations.    Communicate with your physician's office through a password-protected site.    Conveniently print information such as your medication lists.  To find out if MyChart is right for you, please talk to a member of our clinical staff today. We will gladly answer your questions about this free health and wellness tool.  If you are age 77 or older and want a member of your family to have access to your record, you must provide written consent by completing a proxy form available at our office. Please speak to our clinical staff about guidelines regarding accounts for patients younger than age 74.  As you activate your MyChart account and need any technical assistance, please call the MyChart technical support line at [phone]) or email your question to [email or web address]. If you email your question(s), please include your name, a return phone number and the best time to reach you.  If you have non-urgent health-related questions, you can send a message to our  office through MyChart at [email or web address]. If you have a medical emergency, call 911.  Thank you for using MyChart as your new health and wellness resource!   MyChart licensed from [employer],  [id number]. Patents Pending.

## 2013-02-26 ENCOUNTER — Ambulatory Visit: Payer: Self-pay | Admitting: Podiatry

## 2013-03-05 ENCOUNTER — Encounter: Payer: Self-pay | Admitting: Internal Medicine

## 2013-03-05 ENCOUNTER — Ambulatory Visit (INDEPENDENT_AMBULATORY_CARE_PROVIDER_SITE_OTHER): Payer: Medicare Other | Admitting: Internal Medicine

## 2013-03-05 VITALS — BP 134/82 | HR 66 | Temp 97.6°F | Wt 151.0 lb

## 2013-03-05 DIAGNOSIS — N83209 Unspecified ovarian cyst, unspecified side: Secondary | ICD-10-CM

## 2013-03-05 DIAGNOSIS — N83202 Unspecified ovarian cyst, left side: Secondary | ICD-10-CM | POA: Insufficient documentation

## 2013-03-05 DIAGNOSIS — D693 Immune thrombocytopenic purpura: Secondary | ICD-10-CM

## 2013-03-05 MED ORDER — TRAMADOL HCL 50 MG PO TABS: 50.0000 mg | ORAL_TABLET | Freq: Four times a day (QID) | ORAL | Status: DC | PRN

## 2013-03-05 NOTE — Patient Instructions (Signed)
It was good to see you today.  We have reviewed your prior records including labs and tests today  Tramadol as needed for pain -prescription provided for you today  Other medications reviewed and updated, no other changes  Followup here after review with gynecology oncology  follow up in 6 months, sooner if problems

## 2013-03-05 NOTE — Assessment & Plan Note (Signed)
Noted on CT 01/2013 during eval for RLQ pain (which has resolved) elevated tumor marker OVA but normal CEA and CA125 - reviewed recent gyn eval Pending gyn onc appt reviewed - 03/12/13 Support offered, reviewed interval today Using Tylenol for discomfort LLQ/L low back -also okay for tramadol prn

## 2013-03-05 NOTE — Progress Notes (Signed)
Subjective:    Patient ID: Tamara Robertson, female    DOB: [birth year], 79 y.o.   MRN: [id number]  HPI  Here for follow up - reviewed interval events  Past Medical History  Diagnosis Date  . OSTEOARTHRITIS, KNEE, RIGHT   . URINARY INCONTINENCE   . Irritable bowel syndrome   . DIVERTICULITIS, HX OF   . OSTEOPENIA   . Immune thrombocytopenic purpura     chronic - baseline 80-100K, on pred  . UNSPECIFIED PERIPHERAL VASCULAR DISEASE   . OVERACTIVE BLADDER   . DYSLIPIDEMIA   . Glaucoma   . GERD   . HOH (hard of hearing)   . CAD (coronary artery disease)     RCA 40% stenosis cath 01/2011    Review of Systems  Constitutional: Positive for fatigue. Negative for fever and unexpected weight change.  Genitourinary: Positive for pelvic pain. Negative for dysuria, frequency and hematuria.  Musculoskeletal: Positive for back pain (Left side, low "ache"). Negative for arthralgias, gait problem and joint swelling.       Objective:   Physical Exam BP 134/82  Pulse 66  Temp(Src) 97.6 F (36.4 C) (Oral)  Wt 151 lb (68.493 kg)  BMI 27.16 kg/m2  SpO2 98% Wt Readings from Last 3 Encounters:  03/05/13 151 lb (68.493 kg)  02/24/13 150 lb (68.04 kg)  02/11/13 149 lb 12.8 oz (67.949 kg)   Constitutional: She appears well-developed and well-nourished. No distress Cardiovascular: Normal rate, regular rhythm and normal heart sounds.  No murmur heard. No BLE edema. Pulmonary/Chest: Effort normal and breath sounds normal. No respiratory distress. She has no wheezes.  Abdominal: Soft. Bowel sounds are normal. She exhibits no distension. There is no tenderness. no masses Musculoskeletal: Back: full range of motion of thoracic and lumbar spine. Non tender to palpation. Negative straight leg raise. DTR's are symmetrically intact. Sensation intact in all dermatomes of the lower extremities. Full strength to manual muscle testing. patient is able to heel toe walk without difficulty and ambulates with antalgic  gait. Neurological: She is alert and oriented to person, place, and time. No cranial nerve deficit. Coordination, balance, strength, speech and gait are normal.  Skin: Skin is warm and dry. No rash noted. No erythema.  Psychiatric: She has a appropriately dysphoric mood and affect. Her behavior is normal. Judgment and thought content normal.   Lab Results  Component Value Date   WBC 11.3* 02/11/2013   HGB 13.0 02/11/2013   HCT 39.2 02/11/2013   PLT 123* 02/11/2013   GLUCOSE 82 02/11/2013   CHOL 185 09/05/2012   TRIG 174.0* 09/05/2012   HDL 46.70 09/05/2012   LDLDIRECT 138.1 01/23/2011   LDLCALC 104* 09/05/2012   ALT 15 02/11/2013   AST 19 02/11/2013   NA 143 02/11/2013   K 4.3 02/11/2013   CL 106 01/29/2013   CREATININE 0.8 02/11/2013   BUN 16.7 02/11/2013   CO2 28 02/11/2013   TSH 2.95 06/03/2012   INR 1.1* 02/16/2011   HGBA1C 5.9 06/03/2012   Report from gyn: OVA1 4.7 (normal <4.4); CEA 0.05 and CA125 21 (both normal)  Ct Abdomen Pelvis W Contrast  02/02/2013    IMPRESSION: No evidence of bowel obstruction.  Prior splenectomy, sigmoidectomy, and hysterectomy. Suspected prior appendectomy.  No CT findings to account for the patient's right lower quadrant abdominal pain.  8 mm hypervascular lesion in the inferior right hepatic lobe, likely reflecting a flash filling hemangioma.  2.8 cm left ovarian cyst. Given postmenopausal status, follow-up pelvic ultrasound is suggested  in 1 year.   Electronically Signed   By: Charline Bills M.D.   On: 02/02/2013 10:42      Assessment & Plan:   Time spent with pt today 25 minutes, greater than 50% time spent counseling patient on Left ovarian cyst, potential of malignancy and medication review. Also review of prior records

## 2013-03-05 NOTE — Progress Notes (Signed)
Pre-visit discussion using our clinic review tool. No additional management support is needed unless otherwise documented below in the visit note.  

## 2013-03-05 NOTE — Assessment & Plan Note (Signed)
Last flare 06/2011 precipitated by URI S/p heme eval 07/2011 for same S/p dose pak burst with resolution, return to baseline-  on low dose maintainence pred chronically The current medical regimen is effective;  continue present plan and medications.

## 2013-03-12 ENCOUNTER — Encounter: Payer: Self-pay | Admitting: Gynecologic Oncology

## 2013-03-12 ENCOUNTER — Ambulatory Visit: Payer: Medicare Other | Attending: Gynecologic Oncology | Admitting: Gynecologic Oncology

## 2013-03-12 VITALS — BP 144/80 | HR 62 | Temp 97.5°F | Resp 16 | Ht 61.97 in | Wt 149.5 lb

## 2013-03-12 DIAGNOSIS — R141 Gas pain: Secondary | ICD-10-CM | POA: Insufficient documentation

## 2013-03-12 DIAGNOSIS — Z90710 Acquired absence of both cervix and uterus: Secondary | ICD-10-CM | POA: Insufficient documentation

## 2013-03-12 DIAGNOSIS — M549 Dorsalgia, unspecified: Secondary | ICD-10-CM | POA: Insufficient documentation

## 2013-03-12 DIAGNOSIS — R1909 Other intra-abdominal and pelvic swelling, mass and lump: Secondary | ICD-10-CM | POA: Insufficient documentation

## 2013-03-12 DIAGNOSIS — IMO0002 Reserved for concepts with insufficient information to code with codable children: Secondary | ICD-10-CM | POA: Insufficient documentation

## 2013-03-12 DIAGNOSIS — D693 Immune thrombocytopenic purpura: Secondary | ICD-10-CM | POA: Insufficient documentation

## 2013-03-12 DIAGNOSIS — R198 Other specified symptoms and signs involving the digestive system and abdomen: Secondary | ICD-10-CM | POA: Insufficient documentation

## 2013-03-12 DIAGNOSIS — R1031 Right lower quadrant pain: Secondary | ICD-10-CM | POA: Insufficient documentation

## 2013-03-12 DIAGNOSIS — R142 Eructation: Secondary | ICD-10-CM | POA: Insufficient documentation

## 2013-03-12 DIAGNOSIS — N83209 Unspecified ovarian cyst, unspecified side: Secondary | ICD-10-CM

## 2013-03-12 NOTE — Progress Notes (Signed)
Consult Note: Gyn-Onc  Tamara Robertson 79 y.o. female  CC:  Chief Complaint  Patient presents with  . Ovarian Cyst    New Consult     HPI: Patient is seen today in consultation at the request of Dr. Seymour Bars.  Patient is an 79 year old gravida 3 para 2 aborta 1 who noticed a change in her bowels and that she has a narrow bowel movements about every 3 weeks and then her bowels have returned to normal. She was also experiencing some right lower quadrant pain. The patient states she has a history of spastic colon and IBS. However she cannot tolerate bowel prep for colonoscopy she underwent a CT scan of the abdomen and pelvis which revealed:  CT ABDOMEN AND PELVIS WITH CONTRAST   Lung bases are clear.  8 mm hypervascular lesion in the inferior right hepatic lobe (series 2/ image 35), likely reflecting a flash filling hemangioma. Prior splenectomy. Pancreas and adrenal glands are within normal limits. Gallbladder is unremarkable. No intrahepatic or extrahepatic ductal dilatation. Right renal cysts, including a 2.7 cm anterior upper pole cyst (series 5/ image 16) and a 1.5 cm posterior lower pole cyst (series 5/ image 7). Left kidney is within normal limits. No hydronephrosis. No evidence of bowel obstruction. Appendix is not discretely visualized and may be surgically absent. Prior sigmoid resection with anastomosis in the left pelvis (series 2/image 68). Atherosclerotic calcifications of the abdominal aorta and branch vessels.  No abdominopelvic ascites.  No suspicious abdominopelvic lymphadenopathy.  Status post hysterectomy. 2.8 cm left ovarian cyst (series 2/ image 61). No right adnexal mass. Mild prominence of the mid right ureter (series 2/image 53), although smoothly tapering distally, favored to reflect physiologic laxity.  Bladder is within normal limits. Degenerative changes of the visualized thoracolumbar spine.  IMPRESSION:  No evidence of bowel obstruction.  Prior splenectomy,  sigmoidectomy, and hysterectomy. Suspected prior appendectomy. No CT findings to account for the patient's right lower quadrant abdominal pain. 8 mm hypervascular lesion in the inferior right hepatic lobe, likely reflecting a flash filling hemangioma. 2.8 cm left ovarian cyst. Given postmenopausal status, follow-up pelvic ultrasound is suggested in 1 year.  The CT findings prompted an ultrasound and Dr. Sharol Roussel office. This was performed on October 14. 2 small cysts were noted within the region of the left adnexa. There was a 2.9 x 2.7 x 2.0 cm cyst with soft tissue nodule measuring 1.3 x 0.6 x 1.1 cm within the cyst. There's a separate small cysts measuring 1.8 x 1.8 x 1.5 cm simple in appearance. There is no increased vascularity noted. A CA 125 was performed that was normal at 23. However, an OVA-1 test was performed that was slightly elevated at 4.7 with the upper limit of normal for a menopausal woman being 5.4.  The patient also has had some backaches for the past 5 date weeks. It does not radiate has not changed, and  in fact is improved. She noticed it primarily with twisting. She's a long-standing history of bloating. She's not sure if any worse. She denies any nausea vomiting. She does admit to early satiety however she'll still eat the same amount because food tastes good and her appetite is not decreased. Her weight is stable. She can climb up a flight of stairs. She walks about 30 minutes every day or every other day. She and her husband do water aerobics classes 3 times a week. This class is an hour at a time. The patient has a history of undergoing hysterectomy  right-sided salpingo-oophorectomy for endometriosis many years ago. The left ovary was left in place secondary to her young age. She was on hormone replacement therapy up to a year ago secondary to hot flashes into her early 62s and late 32s. She recently stopped in 1 year ago. She does have ITP and is followed by Dr. Clelia Croft. She's on  daily steroids. Her mammograms are up-to-date. She cannot do a colonoscopy secondary to not being able to tolerate the bowel prep.  Review of Systems: Constitutional: Denies fever. Skin: No rash, sores, jaundice, itching, or dryness.  Cardiovascular: No chest pain, shortness of breath, or edema  Pulmonary: No cough or wheeze.  Gastro Intestinal:  No nausea, vomiting, constipation, or diarrhea reported. No bright red blood per rectum or change in bowel movement other than as above.  Genitourinary: No frequency, urgency, or dysuria.  Denies vaginal bleeding and discharge.  Musculoskeletal: No myalgia, arthralgia, joint swelling or pain other than as above  Neurologic: No weakness, numbness, or change in gait.  Psychology: No changes   Current Meds:  Outpatient Encounter Prescriptions as of 03/12/2013  Medication Sig  . Biotin 2500 MCG CAPS Take by mouth daily.  . Calcium Citrate (CITRACAL PO) One capful three times a week  . Calcium-Magnesium-Vitamin D (CITRACAL CALCIUM+D) 600-40-500 MG-MG-UNIT TB24 Take 1 tablet by mouth daily.   . dorzolamide-timolol (COSOPT) 22.3-6.8 MG/ML ophthalmic solution   . Flaxseed MISC by Does not apply route. Take 1 tsp daily  . Magnesium 250 MG TABS Take 250 mg by mouth daily.  . Multiple Vitamin (MULTIVITAMIN) tablet Take 1 tablet by mouth daily.    . pantoprazole (PROTONIX) 40 MG tablet Take 40 mg by mouth.  . polyethylene glycol (MIRALAX / GLYCOLAX) packet Take 17 g by mouth every 3 (three) days.  . pravastatin (PRAVACHOL) 40 MG tablet Take 1 tablet (40 mg total) by mouth every evening.  . predniSONE (DELTASONE) 2.5 MG tablet Take 1 tablet (2.5 mg total) by mouth daily.  . Probiotic Product (PROBIOTIC FORMULA PO) Take 1 tablet by mouth daily.    . ranitidine (ZANTAC) 150 MG tablet Take 1 tablet (150 mg total) by mouth as needed for heartburn.  . traMADol (ULTRAM) 50 MG tablet Take 1 tablet (50 mg total) by mouth every 6 (six) hours as needed for pain.     Allergy:  Allergies  Allergen Reactions  . Aspirin Other (See Comments)    ITP  . Contrast Media [Iodinated Diagnostic Agents]     hives    Social Hx:   History   Social History  . Marital Status: Married    Spouse Name: N/A    Number of Children: N/A  . Years of Education: N/A   Occupational History  . Retired    Social History Main Topics  . Smoking status: Never Smoker   . 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 [place] Ham to [place] from [place] 05/2010 to be close to kids  . Alcohol Use: Yes     Comment: rarely  . Drug Use: No  . Sexual Activity: Not on file   Other Topics Concern  . Not on file   Social History Narrative  . No narrative on file    Past Surgical Hx:  Past Surgical History  Procedure Laterality Date  . Splenectomy  1954  . Abdominal hysterectomy  1963  . L pop pta  10/2009  . Laparoscopic sigmoid colectomy  10/2005  .  Cataract extraction, bilateral  10/2010  . Knee arthroscopy Right   . Angioplasty    . Cardiac catheterization      Past Medical Hx:  Past Medical History  Diagnosis Date  . OSTEOARTHRITIS, KNEE, RIGHT   . URINARY INCONTINENCE   . Irritable bowel syndrome   . DIVERTICULITIS, HX OF   . OSTEOPENIA   . Immune thrombocytopenic purpura     chronic - baseline 80-100K, on pred  . UNSPECIFIED PERIPHERAL VASCULAR DISEASE   . OVERACTIVE BLADDER   . DYSLIPIDEMIA   . Glaucoma   . GERD   . HOH (hard of hearing)   . CAD (coronary artery disease)     RCA 40% stenosis cath 01/2011  . Left ovarian cyst dx 01/2013 CT    working with gyn, ?malignant - elevated tumor marker OVA1    Oncology Hx:   No history exists.    Family Hx:  Family History  Problem Relation Age of Onset  . Coronary artery disease Mother   . Heart attack Mother 66  . Hyperlipidemia Mother   . Hypertension Mother   . Stomach cancer Father   . Hypertension Daughter   . Hyperlipidemia Daughter    . Arthritis      parent  . Transient ischemic attack      parent  . Colon cancer Neg Hx     Vitals:  Blood pressure 144/80, pulse 62, temperature 97.5 F (36.4 C), temperature source Oral, resp. rate 16, height 5' 1.97" (1.574 m), weight 149 lb 8 oz (67.813 kg).  Physical Exam: Well-nourished well-developed female in no acute distress.  Neck: Supple, no lymphadenopathy no thyromegaly.  Lungs: Clear to auscultation bilaterally   Cardiovascular: Regular rate rhythm.  Abdomen: Well-healed vertical midline incision. Well-healed left upper quadrant incision. Abdomen is soft, nontender, nondistended. There are no palpable masses. There is no hepatosplenomegaly. There is no fluid wave.  Groins: No lymphadenopathy.  Extremities: No edema.  Pelvic: Normal external female genitalia. Bimanual examination reveals no masses or nodularity. Rectal confirms.    Assessment/Plan: 79 year old gravida 3 para 2 status post hysterectomy and right salpingo-oophorectomy at the age of 38 or 23 4 advanced endometriosis. She not currently has a complex left adnexal mass measuring less than 3 cm. CA 125 was normal at 21, however, over one was slightly elevated at 4.7 with the normal reference range of less than 4.4 and a postmenopausal woman. We discussed these findings. We discussed the findings of her CT scan that did not show any lymphadenopathy, no peritoneal based disease and no free fluid. We discussed proceeding with diagnostic laparoscopy and removal of this mass to be sent for frozen section. I discussed with her surgical considerations including her ITP as well as her multiple prior laparotomies. We also discussed close interval followup. After discussion she and her husband are both very comfortable with close interval followup. We will schedule her for repeat ultrasound in 2-3 months with a CA 125 and ova 1 at the same time. She'll then return to see me for discussion.  Her questions as well as  those of her husband were elicited in answer to her satisfaction. She knows to contact us if she has increasing bloating or other symptoms.  Teea Ducey A., MD 03/12/2013, 12:57 PM

## 2013-03-12 NOTE — Patient Instructions (Signed)
Ovarian Tumors The ovaries are small organs that produce eggs in women. They lie on each side of the uterus. Tumors are solid growths on the ovary, not like ovarian cysts that are filled with fluid. They can be cancerous or noncancerous. All solid tumors should be looked at to make sure they are not cancer tumors.  CAUSES  There are no known causes for developing a solid tumor on the ovary. However, there are several risk factors for developing cancerous tumors on the ovary, such as:  Aging.  North American or North European descent.  Personal or family history of ovarian, colon and breast cancer.  Women with BRCA 1 or BRCA 2 genes are at high risk for getting ovarian cancer.  The use of fertility medications to get pregnant may increase the risk for getting ovarian cancer.  Late menopause (after age 50).  Women who become pregnant for the first time at 30 or older. Having these risk factors does not mean you will get ovarian cancer. However, you should know about them and report any that you have to your caregiver. Also, a woman with none of these risk factors can still get ovarian cancer. SYMPTOMS  In many cases there are no symptoms. Noncancerous tumors usually have no symptoms but cancerous tumors may have symptoms that are minor and resemble other health problems. The following are symptoms that may be important to diagnosing cancer of the ovary:  Unexplained weight loss.  Increase abdominal size.  Pain in the belly (abdomen).  Pain or pressure in the back and pelvis.  Tiredness.  Abnormal vaginal bleeding.  Loss of appetite.  Frequent urination or pressure on your bladder.  Indigestion, increase gas and bloating.  Painful sexual intercourse. DIAGNOSIS   During an exam, an abnormal mass may be found in the pelvis. It is important to have a rectovaginal examination to help find pelvic masses, especially in women over 40 years old.  An ultrasound may be done.  X-ray,  CT scan or MRI imaging.  Blood tests.  A Pap test does not help in diagnosing tumors or cancer of the ovary. New screening tests are always being studied to detect early ovarian cancer. TREATMENT   All solid tumors of the ovary should be evaluated, usually with surgery, to make sure they are not cancerous.  The tumor will be studied in the lab under the microscope to see if it is cancer.  Noncancerous tumors can be removed surgically with or without removing the ovary.  Cancerous tumors usually are removed with the ovary and sometimes both ovaries are removed with the Fallopian tubes, uterus and surrounding lymph nodes to see if the cancer has spread.  Cancerous tumors may also be treated along with the surgery with radiation, chemotherapy or both.  The surgeon should be a gynecology oncologist (cancer specialist in gynecology cancer surgery) and the chemotherapist and radiation therapist should be experienced specialists in their field. HOME CARE INSTRUCTIONS   Inform your caregiver if you or anyone in your family has had cancer.  Follow your caregiver's advice and recommendations regarding medications and follow up care.  Get a yearly physical and gynecology exams. This includes a rectovaginal exam if you are 40 years old or older. SEEK MEDICAL CARE IF:   You have any of the above symptoms that have not gone away after a week of treatment.  You are losing weight for no reason.  You feel generally ill. Document Released: 01/31/2008 Document Revised: 07/16/2011 Document Reviewed: 11/20/2012 [employer]   Patient Information 2014 [employer].  

## 2013-03-19 ENCOUNTER — Ambulatory Visit: Payer: Self-pay | Admitting: Podiatry

## 2013-03-30 ENCOUNTER — Ambulatory Visit (INDEPENDENT_AMBULATORY_CARE_PROVIDER_SITE_OTHER): Payer: Medicare Other | Admitting: Podiatry

## 2013-03-30 ENCOUNTER — Encounter: Payer: Self-pay | Admitting: Podiatry

## 2013-03-30 VITALS — BP 148/71 | HR 75 | Resp 16

## 2013-03-30 DIAGNOSIS — B351 Tinea unguium: Secondary | ICD-10-CM

## 2013-03-30 DIAGNOSIS — M79609 Pain in unspecified limb: Secondary | ICD-10-CM

## 2013-04-01 NOTE — Progress Notes (Signed)
Subjective:     Patient ID: Tamara Robertson, female   DOB: Jul 02, 1929, 79 y.o.   MRN: [id number]  HPI patient presents stating cut my toenails on both my feet they are painful and   Review of Systems     Objective:   Physical Exam Neurovascular status unchanged and patient is well oriented x3. Nail disease with thickness and pain 1-5 both feet    Assessment:     Mycotic nail infection with pain 1-5 both feet    Plan:     Debridement of nailbeds 1-5 both feet with no iatrogenic bleeding noted

## 2013-04-06 ENCOUNTER — Other Ambulatory Visit: Payer: Self-pay

## 2013-04-06 DIAGNOSIS — Z1231 Encounter for screening mammogram for malignant neoplasm of breast: Secondary | ICD-10-CM

## 2013-04-08 ENCOUNTER — Ambulatory Visit: Payer: Self-pay | Admitting: Podiatry

## 2013-05-12 ENCOUNTER — Ambulatory Visit
Admission: RE | Admit: 2013-05-12 | Discharge: 2013-05-12 | Disposition: A | Discharge status: Home / Self Care | Payer: Medicare Other | Source: Ambulatory Visit

## 2013-05-12 DIAGNOSIS — Z1231 Encounter for screening mammogram for malignant neoplasm of breast: Secondary | ICD-10-CM

## 2013-05-22 ENCOUNTER — Ambulatory Visit (INDEPENDENT_AMBULATORY_CARE_PROVIDER_SITE_OTHER): Payer: Medicare Other | Admitting: Internal Medicine

## 2013-05-22 ENCOUNTER — Encounter: Payer: Self-pay | Admitting: Internal Medicine

## 2013-05-22 VITALS — BP 132/72 | HR 70 | Temp 97.3°F | Wt 149.4 lb

## 2013-05-22 DIAGNOSIS — N83202 Unspecified ovarian cyst, left side: Secondary | ICD-10-CM

## 2013-05-22 DIAGNOSIS — IMO0001 Reserved for inherently not codable concepts without codable children: Secondary | ICD-10-CM

## 2013-05-22 DIAGNOSIS — L03019 Cellulitis of unspecified finger: Secondary | ICD-10-CM

## 2013-05-22 DIAGNOSIS — N83209 Unspecified ovarian cyst, unspecified side: Secondary | ICD-10-CM

## 2013-05-22 NOTE — Progress Notes (Signed)
Pre-visit discussion using our clinic review tool. No additional management support is needed unless otherwise documented below in the visit note.  

## 2013-05-22 NOTE — Progress Notes (Signed)
Subjective:    Patient ID: Tamara Robertson, female    DOB: 1930-04-21, 80 y.o.   MRN: [id number]  HPI  Here for right middle finger paronychia Recurrent symptoms every few months, currently resolved spontaneously in past 24 hours with drainage Concerned about long-term recurrence and risk of loss of nail/finger infection  Past Medical History  Diagnosis Date  . OSTEOARTHRITIS, KNEE, RIGHT   . URINARY INCONTINENCE   . Irritable bowel syndrome   . DIVERTICULITIS, HX OF   . OSTEOPENIA   . Immune thrombocytopenic purpura     chronic - baseline 80-100K, on pred  . UNSPECIFIED PERIPHERAL VASCULAR DISEASE   . OVERACTIVE BLADDER   . DYSLIPIDEMIA   . Glaucoma   . GERD   . HOH (hard of hearing)   . CAD (coronary artery disease)     RCA 40% stenosis cath 01/2011  . Left ovarian cyst dx 01/2013 CT    working with gyn, ?malignant - elevated tumor marker OVA1    Review of Systems  Constitutional: Negative for fever and fatigue.  Musculoskeletal: Negative for joint swelling and myalgias.  Skin: Negative for rash and wound.       Objective:   Physical Exam BP 132/72  Pulse 70  Temp(Src) 97.3 F (36.3 C) (Oral)  Wt 149 lb 6.4 oz (67.767 kg)  SpO2 98% Wt Readings from Last 3 Encounters:  05/22/13 149 lb 6.4 oz (67.767 kg)  03/12/13 149 lb 8 oz (67.813 kg)  03/05/13 151 lb (68.493 kg)   Constitutional: She appears well-developed and well-nourished. No distress. Cardiovascular: Normal rate, regular rhythm and normal heart sounds.  No murmur heard. No BLE edema. Pulmonary/Chest: Effort normal and breath sounds normal. No respiratory distress. She has no wheezes.  Skin: Resolution of medial side third right finger paronychia without current infection or drainage. Nail without onychomycosis. Remaining skin is warm and dry. No rash noted. No erythema.  Psychiatric: She has a appropriately dysphoric mood and affect. Her behavior is normal. Judgment and thought content normal.   Lab Results   Component Value Date   WBC 11.3* 02/11/2013   HGB 13.0 02/11/2013   HCT 39.2 02/11/2013   PLT 123* 02/11/2013   GLUCOSE 82 02/11/2013   CHOL 185 09/05/2012   TRIG 174.0* 09/05/2012   HDL 46.70 09/05/2012   LDLDIRECT 138.1 01/23/2011   LDLCALC 104* 09/05/2012   ALT 15 02/11/2013   AST 19 02/11/2013   NA 143 02/11/2013   K 4.3 02/11/2013   CL 106 01/29/2013   CREATININE 0.8 02/11/2013   BUN 16.7 02/11/2013   CO2 28 02/11/2013   TSH 2.95 06/03/2012   INR 1.1* 02/16/2011   HGBA1C 5.9 06/03/2012   Report from gyn: OVA1 4.7 (normal <4.4); CEA 0.05 and CA125 21 (both normal)  Ct Abdomen Pelvis W Contrast  02/02/2013    IMPRESSION: No evidence of bowel obstruction.  Prior splenectomy, sigmoidectomy, and hysterectomy. Suspected prior appendectomy.  No CT findings to account for the patient's right lower quadrant abdominal pain.  8 mm hypervascular lesion in the inferior right hepatic lobe, likely reflecting a flash filling hemangioma.  2.8 cm left ovarian cyst. Given postmenopausal status, follow-up pelvic ultrasound is suggested in 1 year.   Electronically Signed   By: Charline Bills M.D.   On: 02/02/2013 10:42      Assessment & Plan:   Right middle finger paronychia, recurrent, currently no evidence for infection Reassurance provided Education on conservative care reviewed Patient will call if problems,  no specific treatment recommended at this time  Also See problem list. Medications and labs reviewed today.  Time spent with pt today 25 minutes, greater than 50% time spent counseling patient on paronychia, interval events regarding Left ovarian cyst and potential of malignancy and medication review. Also review of prior records

## 2013-05-22 NOTE — Patient Instructions (Signed)
Paronychia Paronychia is an inflammatory reaction involving the folds of the skin surrounding the fingernail. This is commonly caused by an infection in the skin around a nail. The most common cause of paronychia is frequent wetting of the hands (as seen with bartenders, food servers, nurses or others who wet their hands). This makes the skin around the fingernail susceptible to infection by bacteria (germs) or fungus. Other predisposing factors are:  Aggressive manicuring.  Nail biting.  Thumb sucking. The most common cause is a staphylococcal (a type of germ) infection, or a fungal (Candida) infection. When caused by a germ, it usually comes on suddenly with redness, swelling, pus and is often painful. It may get under the nail and form an abscess (collection of pus), or form an abscess around the nail. If the nail itself is infected with a fungus, the treatment is usually prolonged and may require oral medicine for up to one year. Your caregiver will determine the length of time treatment is required. The paronychia caused by bacteria (germs) may largely be avoided by not pulling on hangnails or picking at cuticles. When the infection occurs at the tips of the finger it is called felon. When the cause of paronychia is from the herpes simplex virus (HSV) it is called herpetic whitlow. TREATMENT  When an abscess is present treatment is often incision and drainage. This means that the abscess must be cut open so the pus can get out. When this is done, the following home care instructions should be followed. HOME CARE INSTRUCTIONS   It is important to keep the affected fingers very dry. Rubber or plastic gloves over cotton gloves should be used whenever the hand must be placed in water.  Keep wound clean, dry and dressed as suggested by your caregiver between warm soaks or warm compresses.  Soak in warm water for fifteen to twenty minutes three to four times per day for bacterial infections. Fungal  infections are very difficult to treat, so often require treatment for long periods of time.  For bacterial (germ) infections take antibiotics (medicine which kill germs) as directed and finish the prescription, even if the problem appears to be solved before the medicine is gone.  Only take over-the-counter or prescription medicines for pain, discomfort, or fever as directed by your caregiver. SEEK IMMEDIATE MEDICAL CARE IF:  You have redness, swelling, or increasing pain in the wound.  You notice pus coming from the wound.  You have a fever.  You notice a bad smell coming from the wound or dressing. Document Released: 10/17/2000 Document Revised: 07/16/2011 Document Reviewed: 06/18/2008 [facility] Patient Information 2014 [place]. Paronychia Paronychia is an inflammatory reaction involving the folds of the skin surrounding the fingernail. This is commonly caused by an infection in the skin around a nail. The most common cause of paronychia is frequent wetting of the hands (as seen with bartenders, food servers, nurses or others who wet their hands). This makes the skin around the fingernail susceptible to infection by bacteria (germs) or fungus. Other predisposing factors are:  Aggressive manicuring.  Nail biting.  Thumb sucking. The most common cause is a staphylococcal (a type of germ) infection, or a fungal (Candida) infection. When caused by a germ, it usually comes on suddenly with redness, swelling, pus and is often painful. It may get under the nail and form an abscess (collection of pus), or form an abscess around the nail. If the nail itself is infected with a fungus, the treatment is usually  prolonged and may require oral medicine for up to one year. Your caregiver will determine the length of time treatment is required. The paronychia caused by bacteria (germs) may largely be avoided by not pulling on hangnails or picking at cuticles. When the infection occurs at the tips  of the finger it is called felon. When the cause of paronychia is from the herpes simplex virus (HSV) it is called herpetic whitlow. TREATMENT  When an abscess is present treatment is often incision and drainage. This means that the abscess must be cut open so the pus can get out. When this is done, the following home care instructions should be followed. HOME CARE INSTRUCTIONS   It is important to keep the affected fingers very dry. Rubber or plastic gloves over cotton gloves should be used whenever the hand must be placed in water.  Keep wound clean, dry and dressed as suggested by your caregiver between warm soaks or warm compresses.  Soak in warm water for fifteen to twenty minutes three to four times per day for bacterial infections. Fungal infections are very difficult to treat, so often require treatment for long periods of time.  For bacterial (germ) infections take antibiotics (medicine which kill germs) as directed and finish the prescription, even if the problem appears to be solved before the medicine is gone.  Only take over-the-counter or prescription medicines for pain, discomfort, or fever as directed by your caregiver. SEEK IMMEDIATE MEDICAL CARE IF:  You have redness, swelling, or increasing pain in the wound.  You notice pus coming from the wound.  You have a fever.  You notice a bad smell coming from the wound or dressing. Document Released: 10/17/2000 Document Revised: 07/16/2011 Document Reviewed: 06/18/2008 [facility] Patient Information 2014 [place].

## 2013-05-22 NOTE — Assessment & Plan Note (Signed)
Noted on CT 01/2013 during eval for RLQ pain (which has resolved) Mildly elevated tumor marker OVA1 but normal CEA and CA125 - reviewed last gyn eval Pola Corn) s/p gyn onc appt 03/12/13 -opted for serial ultrasound and interval followup, as scheduled later this month Support offered, reviewed interval today

## 2013-05-25 ENCOUNTER — Other Ambulatory Visit: Payer: Medicare Other

## 2013-05-25 ENCOUNTER — Ambulatory Visit (HOSPITAL_COMMUNITY)
Admission: RE | Admit: 2013-05-25 | Discharge: 2013-05-25 | Disposition: A | Discharge status: Home / Self Care | Payer: Medicare Other | Source: Ambulatory Visit | Attending: Gynecologic Oncology | Admitting: Gynecologic Oncology

## 2013-05-25 DIAGNOSIS — N83209 Unspecified ovarian cyst, unspecified side: Secondary | ICD-10-CM

## 2013-05-25 DIAGNOSIS — Z90710 Acquired absence of both cervix and uterus: Secondary | ICD-10-CM | POA: Insufficient documentation

## 2013-05-26 ENCOUNTER — Ambulatory Visit: Payer: Medicare Other | Admitting: Internal Medicine

## 2013-05-26 LAB — CA 125: CA 125: 13.8 U/mL (ref 0.0–30.2)

## 2013-05-27 ENCOUNTER — Ambulatory Visit: Payer: Medicare Other | Attending: Gynecologic Oncology | Admitting: Gynecologic Oncology

## 2013-05-27 ENCOUNTER — Encounter: Payer: Self-pay | Admitting: Gynecologic Oncology

## 2013-05-27 VITALS — BP 150/60 | HR 82 | Temp 97.5°F | Resp 16 | Ht 61.97 in | Wt 150.9 lb

## 2013-05-27 DIAGNOSIS — M1710 Unilateral primary osteoarthritis, unspecified knee: Secondary | ICD-10-CM | POA: Insufficient documentation

## 2013-05-27 DIAGNOSIS — N281 Cyst of kidney, acquired: Secondary | ICD-10-CM | POA: Insufficient documentation

## 2013-05-27 DIAGNOSIS — Z79899 Other long term (current) drug therapy: Secondary | ICD-10-CM | POA: Insufficient documentation

## 2013-05-27 DIAGNOSIS — M899 Disorder of bone, unspecified: Secondary | ICD-10-CM | POA: Insufficient documentation

## 2013-05-27 DIAGNOSIS — K7689 Other specified diseases of liver: Secondary | ICD-10-CM | POA: Insufficient documentation

## 2013-05-27 DIAGNOSIS — H409 Unspecified glaucoma: Secondary | ICD-10-CM | POA: Insufficient documentation

## 2013-05-27 DIAGNOSIS — Z90710 Acquired absence of both cervix and uterus: Secondary | ICD-10-CM | POA: Insufficient documentation

## 2013-05-27 DIAGNOSIS — IMO0002 Reserved for concepts with insufficient information to code with codable children: Secondary | ICD-10-CM | POA: Insufficient documentation

## 2013-05-27 DIAGNOSIS — K219 Gastro-esophageal reflux disease without esophagitis: Secondary | ICD-10-CM | POA: Insufficient documentation

## 2013-05-27 DIAGNOSIS — M949 Disorder of cartilage, unspecified: Secondary | ICD-10-CM

## 2013-05-27 DIAGNOSIS — I2510 Atherosclerotic heart disease of native coronary artery without angina pectoris: Secondary | ICD-10-CM | POA: Insufficient documentation

## 2013-05-27 DIAGNOSIS — Z9089 Acquired absence of other organs: Secondary | ICD-10-CM | POA: Insufficient documentation

## 2013-05-27 DIAGNOSIS — E785 Hyperlipidemia, unspecified: Secondary | ICD-10-CM | POA: Insufficient documentation

## 2013-05-27 DIAGNOSIS — N83209 Unspecified ovarian cyst, unspecified side: Secondary | ICD-10-CM | POA: Insufficient documentation

## 2013-05-27 DIAGNOSIS — H9190 Unspecified hearing loss, unspecified ear: Secondary | ICD-10-CM | POA: Insufficient documentation

## 2013-05-27 DIAGNOSIS — I700 Atherosclerosis of aorta: Secondary | ICD-10-CM | POA: Insufficient documentation

## 2013-05-27 DIAGNOSIS — I739 Peripheral vascular disease, unspecified: Secondary | ICD-10-CM | POA: Insufficient documentation

## 2013-05-27 NOTE — Patient Instructions (Signed)
Please call our office in 6 months so that we can schedule your followup ultrasound for January of 2016. Please notify us if you experience any increased abdominal or pelvic pain, bloating, nausea or vomiting.

## 2013-05-27 NOTE — Progress Notes (Signed)
Consult Note: Gyn-Onc  Tamara Robertson 80 y.o. female  CC:  Chief Complaint  Patient presents with  . Ovarian Cyst    Follow up    HPI:   Patient is an 80 year old gravida 3 para 2 aborta 1 who noticed a change in her bowels and that she has a narrow bowel movements about every 3 weeks and then her bowels have returned to normal. She was also experiencing some right lower quadrant pain. The patient states she has a history of spastic colon and IBS. However she cannot tolerate bowel prep for colonoscopy she underwent a CT scan of the abdomen and pelvis which revealed:  CT ABDOMEN AND PELVIS WITH CONTRAST   Lung bases are clear.  8 mm hypervascular lesion in the inferior right hepatic lobe (series 2/ image 35), likely reflecting a flash filling hemangioma. Prior splenectomy. Pancreas and adrenal glands are within normal limits. Gallbladder is unremarkable. No intrahepatic or extrahepatic ductal dilatation. Right renal cysts, including a 2.7 cm anterior upper pole cyst (series 5/ image 16) and a 1.5 cm posterior lower pole cyst (series 5/ image 7). Left kidney is within normal limits. No hydronephrosis. No evidence of bowel obstruction. Appendix is not discretely visualized and may be surgically absent. Prior sigmoid resection with anastomosis in the left pelvis (series 2/image 68). Atherosclerotic calcifications of the abdominal aorta and branch vessels.  No abdominopelvic ascites.  No suspicious abdominopelvic lymphadenopathy.  Status post hysterectomy. 2.8 cm left ovarian cyst (series 2/ image 61). No right adnexal mass. Mild prominence of the mid right ureter (series 2/image 53), although smoothly tapering distally, favored to reflect physiologic laxity.  Bladder is within normal limits. Degenerative changes of the visualized thoracolumbar spine.  IMPRESSION:  No evidence of bowel obstruction.  Prior splenectomy, sigmoidectomy, and hysterectomy. Suspected prior appendectomy. No CT findings to  account for the patient's right lower quadrant abdominal pain. 8 mm hypervascular lesion in the inferior right hepatic lobe, likely reflecting a flash filling hemangioma. 2.8 cm left ovarian cyst. Given postmenopausal status, follow-up pelvic ultrasound is suggested in 1 year.  The CT findings prompted an ultrasound and Dr. Assunta Curtis office. This was performed on October 14. 2 small cysts were noted within the region of the left adnexa. There was a 2.9 x 2.7 x 2.0 cm cyst with soft tissue nodule measuring 1.3 x 0.6 x 1.1 cm within the cyst. There's a separate small cysts measuring 1.8 x 1.8 x 1.5 cm simple in appearance. There is no increased vascularity noted. A CA 125 was performed that was normal at 23. However, an OVA-1 test was performed that was slightly elevated at 4.7 with the upper limit of normal for a menopausal woman being 4.4.  Ultrasound 05/25/13: Left ovary  Measurements: 3.5 x 2.4 x 3.3 cm. Two cyst within the left ovary measuring 2.2 x 1.4 x 2.0 cm and 2.3 x 2.3 x 2.5 cm. These either represent 2 adjacent simple cyst with the measurements as above or 1  larger cyst measuring up to 3.5 cm with a thin internal septation. Other findings No free fluid.  IMPRESSION:  Either two adjacent simple cysts or one larger cyst with a thin internal septation as described above. This is almost certainly benign, but follow up ultrasound is recommended in 1 year according to the Society of Radiologists in [place] Colon Statement (D Clovis Riley et al. Management of Asymptomatic Ovarian and Other Adnexal Cysts Imaged at [place]: Society of Radiologists in [place] Statement 2010. Radiology  256 (Sept 2010): 943-954.).  A CA 125 was also drawn that was normal at 13.8. This is down from 21 on 02/17/2013. She comes in today for followup. She is very pleased with the results of the ultrasound as well as the blood work. She's been completely asymptomatic and has no  complaints.   Review of Systems: Constitutional: Denies fever. Skin: No rash, sores, jaundice, itching, or dryness.  Cardiovascular: No chest pain, shortness of breath, or edema  Pulmonary: No cough or wheeze.  Gastro Intestinal:  No nausea, vomiting, constipation, or diarrhea reported. No bright red blood per rectum or change in bowel movement other than as above. She has occasional epigastric discomfort and is seeing Dr. Hilarie Fredrickson in gastroenterology soon. Genitourinary: No frequency, urgency, or dysuria.  Denies vaginal bleeding and discharge.  Musculoskeletal: No myalgia, arthralgia, joint swelling or pain other than as above  Neurologic: No weakness, numbness, or change in gait.  Psychology: No changes   Current Meds:  Outpatient Encounter Prescriptions as of 05/27/2013  Medication Sig  . Biotin 2500 MCG CAPS Take by mouth daily.  . Calcium Citrate (CITRACAL PO) One capful three times a week  . Calcium-Magnesium-Vitamin D (CITRACAL CALCIUM+D) 600-40-500 MG-MG-UNIT TB24 Take 1 tablet by mouth daily.   . dorzolamide-timolol (COSOPT) 22.3-6.8 MG/ML ophthalmic solution   . Flaxseed MISC by Does not apply route. Take 1 tsp daily  . Magnesium 250 MG TABS Take 250 mg by mouth daily.  . Multiple Vitamin (MULTIVITAMIN) tablet Take 1 tablet by mouth daily.    . pantoprazole (PROTONIX) 40 MG tablet Take 40 mg by mouth.  . polyethylene glycol (MIRALAX / GLYCOLAX) packet Take 17 g by mouth every 3 (three) days.  . pravastatin (PRAVACHOL) 40 MG tablet Take 1 tablet (40 mg total) by mouth every evening.  . predniSONE (DELTASONE) 2.5 MG tablet Take 1 tablet (2.5 mg total) by mouth daily.  . Probiotic Product (PROBIOTIC FORMULA PO) Take 1 tablet by mouth daily.    . ranitidine (ZANTAC) 150 MG tablet Take 1 tablet (150 mg total) by mouth as needed for heartburn.  . traMADol (ULTRAM) 50 MG tablet Take 1 tablet (50 mg total) by mouth every 6 (six) hours as needed for pain.    Allergy:  Allergies   Allergen Reactions  . Aspirin Other (See Comments)    ITP  . Contrast Media [Iodinated Diagnostic Agents]     hives    Social Hx:   History   Social History  . Marital Status: Married    Spouse Name: N/A    Number of Children: N/A  . Years of Education: N/A   Occupational History  . Retired    Social History Main Topics  . Smoking status: Never Smoker   . Smokeless tobacco: Never Used     Comment: Married, lives with spouse. retired Control and instrumentation engineer. Dorie Rank to [place] from [place] 05/2010 to be close to kids  . Alcohol Use: Yes     Comment: rarely  . Drug Use: No  . Sexual Activity: Not on file   Other Topics Concern  . Not on file   Social History Narrative  . No narrative on file    Past Surgical Hx:  Past Surgical History  Procedure Laterality Date  . Splenectomy  1954  . Abdominal hysterectomy  1963  . L pop pta  10/2009  . Laparoscopic sigmoid colectomy  10/2005  . Cataract extraction, bilateral  10/2010  . Knee arthroscopy Right   .  Angioplasty    . Cardiac catheterization      Past Medical Hx:  Past Medical History  Diagnosis Date  . OSTEOARTHRITIS, KNEE, RIGHT   . URINARY INCONTINENCE   . Irritable bowel syndrome   . DIVERTICULITIS, HX OF   . OSTEOPENIA   . Immune thrombocytopenic purpura     chronic - baseline 80-100K, on pred  . UNSPECIFIED PERIPHERAL VASCULAR DISEASE   . OVERACTIVE BLADDER   . DYSLIPIDEMIA   . Glaucoma   . GERD   . HOH (hard of hearing)   . CAD (coronary artery disease)     RCA 40% stenosis cath 01/2011  . Left ovarian cyst dx 01/2013 CT    working with gyn, ?malignant - elevated tumor marker OVA1    Oncology Hx:   No history exists.    Family Hx:  Family History  Problem Relation Age of Onset  . Coronary artery disease Mother   . Heart attack Mother 29  . Hyperlipidemia Mother   . Hypertension Mother   . Stomach cancer Father   . Hypertension Daughter   . Hyperlipidemia Daughter   . Arthritis      parent   . Transient ischemic attack      parent  . Colon cancer Neg Hx     Vitals:  Blood pressure 150/60, pulse 82, temperature 97.5 F (36.4 C), temperature source Oral, resp. rate 16, height 5' 1.97" (1.574 m), weight 150 lb 14.4 oz (68.448 kg).  Physical Exam: Well-nourished well-developed female in no acute distress.  Abdomen: Well-healed vertical midline incision. Well-healed left upper quadrant incision. Abdomen is soft, nontender, nondistended. There are no palpable masses. There is no hepatosplenomegaly. There is no fluid wave.  Groins: No lymphadenopathy.  Extremities: No edema.  Pelvic: Normal external female genitalia. Bimanual examination reveals no masses or nodularity. Rectal confirms.   Assessment/Plan: 80 year old gravida 3 para 2 status post hysterectomy and right salpingo-oophorectomy at the age of 10 or 70 4 advanced endometriosis. She currently has a complex left adnexal mass measuring less than 3 cm. CA 125 was normal at 13, however, OVA-1 was slightly elevated at 4.7 with the normal reference range of less than 4.4 and a postmenopausal woman.  At this point as it is not appear that she's been a dramatic increase in the size of the cyst and her CA 125 has decreased from 21 she would like to proceed with close followup. Due to the stability I think that repeat imaging in one year is reasonable. She is to contact us if she experiences any symptoms. These were again reviewed and include bloating, pelvic pain or discomfort, change about bladder habits, or early satiety. Her questions  were elicited and answered to her satisfaction.   Tamara Robertson A., MD 05/27/2013, 3:32 PM

## 2013-05-28 LAB — OVA 1: OVA 1: 4.3

## 2013-06-10 ENCOUNTER — Encounter: Payer: Self-pay | Admitting: Gastroenterology

## 2013-06-15 ENCOUNTER — Encounter: Payer: Self-pay | Admitting: Internal Medicine

## 2013-06-15 ENCOUNTER — Ambulatory Visit (INDEPENDENT_AMBULATORY_CARE_PROVIDER_SITE_OTHER): Payer: Medicare Other | Admitting: Internal Medicine

## 2013-06-15 VITALS — BP 158/68 | HR 80 | Ht 62.0 in | Wt 152.1 lb

## 2013-06-15 DIAGNOSIS — K5900 Constipation, unspecified: Secondary | ICD-10-CM

## 2013-06-15 DIAGNOSIS — M949 Disorder of cartilage, unspecified: Secondary | ICD-10-CM

## 2013-06-15 DIAGNOSIS — R141 Gas pain: Secondary | ICD-10-CM

## 2013-06-15 DIAGNOSIS — K589 Irritable bowel syndrome without diarrhea: Secondary | ICD-10-CM

## 2013-06-15 DIAGNOSIS — R143 Flatulence: Secondary | ICD-10-CM

## 2013-06-15 DIAGNOSIS — N83209 Unspecified ovarian cyst, unspecified side: Secondary | ICD-10-CM

## 2013-06-15 DIAGNOSIS — M8580 Other specified disorders of bone density and structure, unspecified site: Secondary | ICD-10-CM

## 2013-06-15 DIAGNOSIS — R142 Eructation: Secondary | ICD-10-CM

## 2013-06-15 DIAGNOSIS — M899 Disorder of bone, unspecified: Secondary | ICD-10-CM

## 2013-06-15 DIAGNOSIS — K219 Gastro-esophageal reflux disease without esophagitis: Secondary | ICD-10-CM

## 2013-06-15 DIAGNOSIS — R140 Abdominal distension (gaseous): Secondary | ICD-10-CM

## 2013-06-15 MED ORDER — SIMETHICONE 125 MG PO CHEW: 125.0000 mg | CHEWABLE_TABLET | Freq: Four times a day (QID) | ORAL | Status: DC | PRN

## 2013-06-15 NOTE — Patient Instructions (Addendum)
You have been given samples of Phazym.  Dr. Hilarie Fredrickson has recommended you follow the FODMAP diet.   You will be contacted by Dr. Langston Reusing office for a referrral to Endoscrinology.   Stay on protonix  Rotate between miralax and citricel

## 2013-06-15 NOTE — Progress Notes (Signed)
Subjective:    Patient ID: TAQUILLA ECHEVERRY, female    DOB: June 15, 1929, 80 y.o.   MRN: [id number]  HPI Mrs. Dini is an 80 yo female with PMH of long-standing IBS (she says 50 years), history of diverticulitis requiring sigmoidectomy in 2007, peripheral vascular disease, history of ITP status post splenectomy, GERD, hyperlipidemia, and CAD who is seen in followup. She is here alone today. She reports her bowel movements have been regular without diarrhea or constipation on her current regimen of MiraLax alternating with Citrucel. No rectal bleeding or melena. Her reflux symptoms have improved completely on pantoprazole 40 mg daily. She is worried about his medication as it relates to bone loss and osteopenia. She did try stopping the pantoprazole for several days and substituted famotidine twice daily. The famotidine did help but was not as good. She continues to report abdominal bloating and gas which she says is a very long-standing problem for her. She is using Gas-X, beano and probiotic without complete relief. She wonders if there is a dietary trigger for her gas and bloating. She does report occasionally she has upper epigastric pain. This is episodic lasting only for a few minutes and has been more rare of late. No dysphagia or odynophagia. No nausea or vomiting. Very good appetite without early satiety.  She did see gynecology/oncology for evaluation of her ovarian cyst. The cyst was felt to be stable, low risk, and surveillance ultrasound was recommended in one year.    Review of Systems As per history of present illness, otherwise negative  Current Medications, Allergies, Past Medical History, Past Surgical History, Family History and Social History were reviewed in [employer] record.     Objective:   Physical Exam BP 158/68  Pulse 80  Ht 5\' 2"  (1.575 m)  Wt 152 lb 2 oz (69.003 kg)  BMI 27.82 kg/m2 Constitutional: Well-developed and well-nourished. No  distress. HEENT: Normocephalic and atraumatic. Oropharynx is clear and moist. No oropharyngeal exudate. Conjunctivae are normal.  No scleral icterus. Neck: Neck supple. Trachea midline. Cardiovascular: Normal rate, regular rhythm and intact distal pulses.  Pulmonary/chest: Effort normal and breath sounds normal. No wheezing, rales or rhonchi. Abdominal: Soft, nontender, nondistended. Bowel sounds active throughout.  Extremities: no clubbing, cyanosis, or edema Lymphadenopathy: No cervical adenopathy noted. Neurological: Alert and oriented to person place and time. Skin: Skin is warm and dry. No rashes noted. Psychiatric: Normal mood and affect. Behavior is normal.  CBC    Component Value Date/Time   WBC 11.3* 02/11/2013 1000   WBC 12.3* 01/29/2013 0954   RBC 4.57 02/11/2013 1000   RBC 4.84 01/29/2013 0954   HGB 13.0 02/11/2013 1000   HGB 14.0 01/29/2013 0954   HCT 39.2 02/11/2013 1000   HCT 41.5 01/29/2013 0954   PLT 123* 02/11/2013 1000   PLT 114.0* 01/29/2013 0954   MCV 85.8 02/11/2013 1000   MCV 85.8 01/29/2013 0954   MCH 28.4 02/11/2013 1000   MCHC 33.1 02/11/2013 1000   MCHC 33.7 01/29/2013 0954   RDW 13.6 02/11/2013 1000   RDW 13.1 01/29/2013 0954   LYMPHSABS 3.3 02/11/2013 1000   LYMPHSABS 3.8 12/19/2012 1154   MONOABS 1.6* 02/11/2013 1000   MONOABS 1.4* 12/19/2012 1154   EOSABS 0.2 02/11/2013 1000   EOSABS 0.3 12/19/2012 1154   BASOSABS 0.1 02/11/2013 1000   BASOSABS 0.0 12/19/2012 1154      Assessment & Plan:   80 yo female with PMH of long-standing IBS (she says 50 years),  history of diverticulitis requiring sigmoidectomy in 2007, peripheral vascular disease, history of ITP status post splenectomy, GERD, hyperlipidemia, and CAD who is seen in followup  1.  GERD -- improved symptoms with pantoprazole 40 mg daily. For now she will continue this medication 30 minutes before breakfast daily. We did have a discussion about PPI and overall bone health, see #2  2.  Osteopenia with PPI -- she  is concerned about her bone loss and overall bone health as it relates to PPI. We did discuss the association with PPI in bone mineral density and that these medications can impair calcium absorption. She would like to know more about this and I will refer her to endocrinology, Dr.Gherghe for this discussion.  3.  Mild constipation -- she will continue with MiraLax alternating Citrucel as it is providing good result  4.  Gas and bloating/IBS -- her gas and bloating is a long-standing issue for her and very likely relates to her irritable bowel. I have recommended a low gas diet and she was given copy of FODMAP diet.  We did discuss how certain foods are definitely associated with increased intestinal gas production/formation.  She was also given samples of Phazyme to use twice daily as needed.  5.  Ovarian cyst -- evaluated by Dr.Gehrig, and plans for followup ultrasound in one year  Return as needed

## 2013-06-16 ENCOUNTER — Other Ambulatory Visit: Payer: Self-pay | Admitting: Gastroenterology

## 2013-06-16 DIAGNOSIS — M8580 Other specified disorders of bone density and structure, unspecified site: Secondary | ICD-10-CM

## 2013-06-26 ENCOUNTER — Ambulatory Visit: Payer: Medicare Other | Admitting: Cardiology

## 2013-07-02 ENCOUNTER — Ambulatory Visit: Payer: Medicare Other | Admitting: Podiatry

## 2013-07-03 ENCOUNTER — Ambulatory Visit: Payer: Medicare Other | Admitting: Internal Medicine

## 2013-07-06 ENCOUNTER — Ambulatory Visit (INDEPENDENT_AMBULATORY_CARE_PROVIDER_SITE_OTHER): Payer: Medicare Other | Admitting: Internal Medicine

## 2013-07-06 ENCOUNTER — Encounter: Payer: Self-pay | Admitting: Internal Medicine

## 2013-07-06 VITALS — BP 136/64 | HR 71 | Temp 98.2°F | Resp 12 | Ht 62.0 in | Wt 152.0 lb

## 2013-07-06 DIAGNOSIS — M899 Disorder of bone, unspecified: Secondary | ICD-10-CM

## 2013-07-06 DIAGNOSIS — M949 Disorder of cartilage, unspecified: Principal | ICD-10-CM

## 2013-07-06 NOTE — Progress Notes (Signed)
Patient ID: Tamara Robertson, female   DOB: June 07, 1929, 80 y.o.   MRN: [id number]   HPI  Tamara Robertson is a 80 y.o.-year-old female, referred by Dr Rhea Belton, for evaluation for osteopenia.  Pt was dx with osteopenia when she started Prednisone ~15 years ago for ITP >> 60 mg daily then now on Prednisone every other day. She was on 5 mg daily ~ 10 years ago.  She denies any falls, but had a ? small fracture in R hip 40 years ago (bone chipped?). No dizziness/vertigo/orthostasis.  I reviewed her spine MRI from 2011: no vb fracture.  I reviewed pt's DEXA scans: Date L1-L4 T score FN T score FRAX  02/28/2011 (Spine scoliotic and rotated!) -0.1 RFN: -1.6 LFN: -1.6 - calculated I believe counting in her Prednisone:   20% MOF risk  6.7% hip fx risk   No h/o hyper/hypocalcemia. No h/o hyperparathyroidism. No h/o kidney stones. Lab Results  Component Value Date   CALCIUM 9.3 02/11/2013   CALCIUM 9.6 01/29/2013   CALCIUM 9.5 12/19/2012   CALCIUM 9.5 08/12/2012   CALCIUM 9.2 06/03/2012   CALCIUM 9.7 04/17/2012   CALCIUM 9.1 02/12/2012   CALCIUM 9.5 08/16/2011   CALCIUM 9.1 02/16/2011   CALCIUM 9.5 01/27/2011   No h/o vitamin D deficiency, but no vit D levels to review.  Pt is on Omeprazole, recently started by Dr Rhea Belton.  No h/o thyrotoxicosis. Reviewed TSH recent levels:  Lab Results  Component Value Date   TSH 2.95 06/03/2012   TSH 1.40 01/23/2011   TSH 2.52 02/20/2010   TSH 1.81 12/20/2009   TSH 1.98 08/05/2009   Pt is on calcium citrate and vitamin D: 600-500 mg-units daily, in am, after b'fast (if >1x a day >> constipation) + from MVI. She also eats dairy and green, leafy, vegetables.   No weight bearing exercises. She walks outside, but legs hurting more lately. Before: 4-5x a week. Also, water aerobics previously.  She does not take high vitamin A doses.  No h/o CKD. Last BUN/Cr: Lab Results  Component Value Date   BUN 16.7 02/11/2013   CREATININE 0.8 02/11/2013   She has not been on OP  treatments other than Ca + vit D.  Pt does not have a FH of osteoporosis. No FH of kyphosis or hip fractures.    Menopause was at 43+ y/o (hysterectomy + unilat oophorectomy in her 30s).  ROS: Constitutional: + weight gain, + fatigue, + hot flushes, + poor sleep, + nocturia Eyes: + blurry vision, no xerophthalmia ENT: + sore throat, no nodules palpated in throat, no dysphagia/odynophagia, no hoarseness, + tinnitus Cardiovascular: + CP/+ SOB/+ palpitations/+ leg swelling Respiratory: + cough/+ SOB Gastrointestinal: no N/V/D/+ C, + heartburn Musculoskeletal: + muscle aches/+ joint aches Skin: no rashes, + itching, + easy bruising, + hair loss Neurological: no tremors/numbness/tingling/dizziness, + HA Psychiatric: + both: depression/anxiety Low libido  Past Medical History  Diagnosis Date  . OSTEOARTHRITIS, KNEE, RIGHT   . URINARY INCONTINENCE   . Irritable bowel syndrome   . DIVERTICULITIS, HX OF   . OSTEOPENIA   . Immune thrombocytopenic purpura     chronic - baseline 80-100K, on pred  . UNSPECIFIED PERIPHERAL VASCULAR DISEASE   . OVERACTIVE BLADDER   . DYSLIPIDEMIA   . Glaucoma   . GERD   . HOH (hard of hearing)   . CAD (coronary artery disease)     RCA 40% stenosis cath 01/2011  . Left ovarian cyst dx 01/2013 CT  working with gyn, ?malignant - elevated tumor marker OVA1   Past Surgical History  Procedure Laterality Date  . Splenectomy  1954  . Abdominal hysterectomy  1963  . L pop pta  10/2009  . Laparoscopic sigmoid colectomy  10/2005  . Cataract extraction, bilateral  10/2010  . Knee arthroscopy Right   . Angioplasty    . Cardiac catheterization     History   Social History  . Marital Status: Married    Spouse Name: N/A    Number of Children: 3   Occupational History  . Retired    Social History Main Topics  . Smoking status: Never Smoker   . 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 Linton Ham to [place]  from [place] [facility] 05/2010 to be close to kids  . Alcohol Use: Yes     Comment: rarely, socially - winer  . Drug Use: No   Current Outpatient Prescriptions on File Prior to Visit  Medication Sig Dispense Refill  . Biotin 2500 MCG CAPS Take by mouth daily.      . Calcium Citrate (CITRACAL PO) One capful three times a week      . Calcium-Magnesium-Vitamin D (CITRACAL CALCIUM+D) 600-40-500 MG-MG-UNIT TB24 Take 1 tablet by mouth daily.       . dorzolamide-timolol (COSOPT) 22.3-6.8 MG/ML ophthalmic solution       . Flaxseed MISC by Does not apply route. Take 1 tsp daily      . Magnesium 250 MG TABS Take 250 mg by mouth daily.      . Multiple Vitamin (MULTIVITAMIN) tablet Take 1 tablet by mouth daily.        . pantoprazole (PROTONIX) 40 MG tablet Take 40 mg by mouth.      . polyethylene glycol (MIRALAX / GLYCOLAX) packet Take 17 g by mouth every 3 (three) days.      . predniSONE (DELTASONE) 2.5 MG tablet Take 1 tablet (2.5 mg total) by mouth daily.  90 tablet  1  . Probiotic Product (PROBIOTIC FORMULA PO) Take 1 tablet by mouth daily.        . ranitidine (ZANTAC) 150 MG tablet Take 1 tablet (150 mg total) by mouth as needed for heartburn.  30 tablet  2  . simethicone (PHAZYME) 125 MG chewable tablet Chew 1 tablet (125 mg total) by mouth every 6 (six) hours as needed for flatulence.  30 tablet  0   No current facility-administered medications on file prior to visit.   Allergies  Allergen Reactions  . Aspirin Other (See Comments)    ITP  . Contrast Media [Iodinated Diagnostic Agents]     hives   Family History  Problem Relation Age of Onset  . Coronary artery disease Mother   . Heart attack Mother 35  . Hyperlipidemia Mother   . Hypertension Mother   . Stomach cancer Father   . Hypertension Daughter   . Hyperlipidemia Daughter   . Arthritis      parent  . Transient ischemic attack      parent  . Colon cancer Neg Hx    PE: BP 136/64  Pulse 71  Temp(Src) 98.2 F (36.8 C) (Oral)   Resp 12  Ht 5\' 2"  (1.575 m)  Wt 152 lb (68.947 kg)  BMI 27.79 kg/m2  SpO2 99% Wt Readings from Last 3 Encounters:  07/06/13 152 lb (68.947 kg)  06/15/13 152 lb 2 oz (69.003 kg)  05/27/13 150 lb  14.4 oz (68.448 kg)   Constitutional: overweight, in NAD. + kyphosis. Eyes: PERRLA, EOMI, no exophthalmos ENT: moist mucous membranes, no thyromegaly, no cervical lymphadenopathy Cardiovascular: RRR, No MRG Respiratory: CTA B Gastrointestinal: abdomen soft, NT, ND, BS+ Musculoskeletal: no deformities, strength intact in all 4. No spine tenderness to percussion. Skin: moist, warm, no rashes Neurological: no tremor with outstretched hands, DTR normal in all 4  Assessment: 1. Osteopenia  Plan: 1. Osteopenia - likely postmenopausal (+ less likely steroid induced) - Discussed about increased risk of fracture, depending on the T score, greatly increased when the T score is lower than -2.5, but it is actually a continuum and -2.5 should not be regarded as an absolute threshold.  - We reviewed her latest DEXA scan (2012) together, and I explained that based on the FRAX scores (recalculated excluding the Corticosteroid use - since she takes <5 mg daily and was not on this dose for at least 10 years), she has an increased risk for fractures >> her 10 year major OP fracture risk is 20% (this is the threshold for tx) and her 10 year hip fx risk is 5.5% (treatment threshold is 3%).  - we also discussed about the way her PPI tx can influence her bone status. It is believed that the most important BMD effect of using PPIs is through reducing calcium absorption. However, since pt is on calcium citrate, this is not much influenced by the more alkaline stomach environment.  - we reviewed her dietary and supplemental calcium and vitamin D intake, which I believe are adequate, but will need a vit D level. I advised her to continue there current supplementation for now - given her specific instructions about food  sources for these - see pt instructions  - discussed fall precautions  - given handout from [facility] Re: weight bearing exercises - do this every day or at least 5/7 days - We will check a Vitamin D today - will check a new DEXA scan and if decreases further, I would strongly encourage treatment, likely in the form of zoledronic acid or denosumab 2/2 her acid reflux - will tentatively schedule to see pt back in a year  Component     Latest Ref Rng 07/06/2013  Vit D, 25-Hydroxy     30 - 89 ng/mL 61  Excellent vit D level!  New DEXA (07/13/2013): Date L1-L4 T score FN T score FRAX  02/28/2011 (Spine scoliotic and rotated!) 0.6 RFN: -1.8 LFN: -1.5 - calculated I believe counting in her Prednisone:   15% MOF risk  4.3% hip fx risk  She would qualify for tx based on the >3% 10 year hip fx risk.   Called and d/w pt about the results. Recommended Reclast or Prolia. Discussed possible SEs. She would like to defer starting tx, but increase her physical exercise. She will see a personal trainer at the gym x 2 sessions (checked into this) to suggest weight-bearing exercises. I will see her back in a year. Would repeat the DEXA in maybe 2 yrs from now.

## 2013-07-06 NOTE — Patient Instructions (Signed)
Please return in 1 year.  We will schedule a new DEXA scan at [facility].  Please stop at the lab.   How Can I Prevent Falls? Men and women with osteoporosis need to take care not to fall down. Falls can break bones. Some reasons people fall are: Poor vision  Poor balance  Certain diseases that affect how you walk  Some types of medicine, such as sleeping pills.  Some tips to help prevent falls outdoors are: Use a cane or walker  Wear rubber-soled shoes so you don't slip  Walk on grass when sidewalks are slippery  In winter, put salt or kitty litter on icy sidewalks.  Some ways to help prevent falls indoors are: Keep rooms free of clutter, especially on floors  Use plastic or carpet runners on slippery floors  Wear low-heeled shoes that provide good support  Do not walk in socks, stockings, or slippers  Be sure carpets and area rugs have skid-proof backs or are tacked to the floor  Be sure stairs are well lit and have rails on both sides  Put grab bars on bathroom walls near tub, shower, and toilet  Use a rubber bath mat in the shower or tub  Keep a flashlight next to your bed  Use a sturdy step stool with a handrail and wide steps  Add more lights in rooms (and night lights) Buy a cordless phone to keep with you so that you don't have to rush to the phone       when it rings and so that you can call for help if you fall.   (adapted from http://www.niams.NightlifePreviews.se)  Dietary sources of calcium and vitamin D:  Calcium content (mg) - http://www.niams.MoviePins.co.za  Fortified oatmeal, 1 packet 350  Sardines, canned in oil, with edible bones, 3 oz. 324  Cheddar cheese, 1 oz. shredded 306  Milk, nonfat, 1 cup 302  Milkshake, 1 cup 300  Yogurt, plain, low-fat, 1 cup 300  Soybeans, cooked, 1 cup 261  Tofu, firm, with calcium,  cup 204  Orange juice, fortified with calcium, 6 oz. 200-260 (varies)  Salmon, canned,  with edible bones, 3 oz. 181  Pudding, instant, made with 2% milk,  cup 153  Baked beans, 1 cup [place], 1% milk fat, 1 cup 138  Spaghetti, lasagna, 1 cup 125  Frozen yogurt, vanilla, soft-serve,  cup 103  Ready-to-eat cereal, fortified with calcium, 1 cup 100-1,000 (varies)  Cheese pizza, 1 slice 962  Fortified waffles, 2 100  Turnip greens, boiled,  cup 99  Broccoli, raw, 1 cup 90  Ice cream, vanilla,  cup 85  Soy or rice milk, fortified with calcium, 1 cup 80-500 (varies)   Vitamin D content (International Units, IU) - https://www.ars.usda.gov Cod liver oil, 1 tablespoon 1,360  Swordfish, cooked, 3 oz 566  Salmon (sockeye), cooked, 3 oz 447  Tuna fish, canned in water, drained, 3 oz 154  Orange juice fortified with vitamin D, 1 cup (check product labels, as amount of added vitamin D varies) 137  Milk, nonfat, reduced fat, and whole, vitamin D-fortified, 1 cup 115-124  Yogurt, fortified with 20% of the daily value for vitamin D, 6 oz 80  Margarine, fortified, 1 tablespoon 60  Sardines, canned in oil, drained, 2 sardines 46  Liver, beef, cooked, 3 oz 42  Egg, 1 large (vitamin D is found in yolk) 41  Ready-to-eat cereal, fortified with 10% of the daily value for vitamin D, 0.75-1 cup  40  Cheese,  Swiss, 1 oz 6   Exercise for Strong Bones (from [place]) There are two types of exercises that are important for building and maintaining bone density:  weight-bearing and muscle-strengthening exercises. Weight-bearing Exercises These exercises include activities that make you move against gravity while staying upright. Weight-bearing exercises can be high-impact or low-impact. High-impact weight-bearing exercises help build bones and keep them strong. If you have broken a bone due to osteoporosis or are at risk of breaking a bone, you may need to avoid high-impact exercises. If you're not sure, you should check with your healthcare  provider. Examples of high-impact weight-bearing exercises are:   Dancing   Doing high-impact aerobics   Hiking   Jogging/running   Jumping Rope   Stair climbing   Tennis Low-impact weight-bearing exercises can also help keep bones strong and are a safe alternative if you cannot do high-impact exercises. Examples of low-impact weight-bearing exercises are:   Using elliptical training machines   Doing low-impact aerobics   Using stair-step machines   Fast walking on a treadmill or outside Muscle-Strengthening Exercises These exercises include activities where you move your body, a weight or some other resistance against gravity. They are also known as resistance exercises and include:   Lifting weights   Using elastic exercise bands   Using weight machines   Lifting your own body weight   Functional movements, such as standing and rising up on your toes Yoga and Pilates can also improve strength, balance and flexibility. However, certain positions may not be safe for people with osteoporosis or those at increased risk of broken bones. For example, exercises that have you bend forward may increase the chance of breaking a bone in the spine. A physical therapist should be able to help you learn which exercises are safe and appropriate for you. Non-Impact Exercises Non-impact exercises can help you to improve balance, posture and how well you move in everyday activities. These exercises can also help to increase muscle strength and decrease the risk of falls and broken bones. Some of these exercises include:   Balance exercises that strengthen your legs and test your balance, such as Tai Chi, can decrease your risk of falls.   Posture exercises that improve your posture and reduce rounded or "sloping" shoulders can help you decrease the chance of breaking a bone, especially in the spine.   Functional exercises that improve how well you move can help you with everyday activities and decrease  your chance of falling and breaking a bone. For example, if you have trouble getting up from a chair or climbing stairs, you should do these activities as exercises. A physical therapist can teach you balance, posture and functional exercises. Starting a New Exercise Program If you haven't exercised regularly for a while, check with your healthcare provider before beginning a new exercise program-particularly if you have health problems such as heart disease, diabetes or high blood pressure. If you're at high risk of breaking a bone, you should work with a physical therapist to develop a safe exercise program. Once you have your healthcare provider's approval, start slowly. If you've already broken bones in the spine because of osteoporosis, be very careful to avoid activities that require reaching down, bending forward, rapid twisting motions, heavy lifting and those that increase your chance of a fall. As you get started, your muscles may feel sore for a day or two after you exercise. If soreness lasts longer, you may be working too hard and need to  ease up. Exercises should be done in a pain-free range of motion. How Much Exercise Do You Need? Weight-bearing exercises 30 minutes on most days of the week. Do a 30-minutesession or multiple sessions spread out throughout the day. The benefits to your bones are the same.   Muscle-strengthening exercises Two to three days per week. If you don't have much time for strengthening/resistance training, do small amounts at a time. You can do just one body part each day. For example do arms one day, legs the next and trunk the next. You can also spread these exercises out during your normal day.  Balance, posture and functional exercises Every day or as often as needed. You may want to focus on one area more than the others. If you have fallen or lose your balance, spend time doing balance exercises. If you are getting rounded shoulders, work more on posture  exercises. If you have trouble climbing stairs or getting up from the couch, do more functional exercises. You can also perform these exercises at one time or spread them during your day. Work with a phyiscal therapist to learn the right exercises for you.

## 2013-07-07 LAB — VITAMIN D 25 HYDROXY (VIT D DEFICIENCY, FRACTURES): VIT D 25 HYDROXY: 61 ng/mL (ref 30–89)

## 2013-07-09 ENCOUNTER — Telehealth: Payer: Self-pay | Admitting: *Deleted

## 2013-07-09 ENCOUNTER — Telehealth: Payer: Self-pay | Admitting: Internal Medicine

## 2013-07-09 NOTE — Telephone Encounter (Signed)
Would like lab results and follow up on bone density scan please

## 2013-07-09 NOTE — Telephone Encounter (Signed)
DEXA scan scheduled for Monday, March 9th at 9:00 am at [facility] office. Called pt and let her know the date and time. Pt understood. Be advised.

## 2013-07-13 ENCOUNTER — Telehealth: Payer: Self-pay | Admitting: Internal Medicine

## 2013-07-13 ENCOUNTER — Ambulatory Visit (INDEPENDENT_AMBULATORY_CARE_PROVIDER_SITE_OTHER)
Admission: RE | Admit: 2013-07-13 | Discharge: 2013-07-13 | Disposition: A | Discharge status: Home / Self Care | Payer: Medicare Other | Source: Ambulatory Visit | Attending: Internal Medicine | Admitting: Internal Medicine

## 2013-07-13 ENCOUNTER — Inpatient Hospital Stay: Admission: RE | Admit: 2013-07-13 | Payer: Medicare Other | Source: Ambulatory Visit

## 2013-07-13 DIAGNOSIS — M899 Disorder of bone, unspecified: Secondary | ICD-10-CM

## 2013-07-13 DIAGNOSIS — M949 Disorder of cartilage, unspecified: Secondary | ICD-10-CM

## 2013-07-13 NOTE — Telephone Encounter (Signed)
Spoke with pt. She stated that the radiology needs to speak with someone regarding a wrist x-ray. Pt went for bone density on 3/9 and while there the tech needed Dr. Cruzita Lederer or Larene Beach to call to discuss about orders being placed from X-ray. Pt went to [facility] do have bone density and x-ray done.

## 2013-07-13 NOTE — Telephone Encounter (Signed)
Needs clarification on the bone density order.

## 2013-07-14 NOTE — Telephone Encounter (Signed)
Spoke with tech in Radiology at [facility]. She stated that Dr Cruzita Lederer needs to put in a new order for the DEXA to include the forearm/radius. Thank you.

## 2013-07-21 ENCOUNTER — Telehealth: Payer: Self-pay | Admitting: *Deleted

## 2013-07-21 ENCOUNTER — Encounter: Payer: Self-pay | Admitting: Internal Medicine

## 2013-07-21 NOTE — Telephone Encounter (Signed)
Called and d/w her

## 2013-07-21 NOTE — Telephone Encounter (Signed)
Pt returning Dr Arman Filter call. Pt stated she will be at home until 1 pm, if she could call her by then. Be advised.

## 2013-07-24 ENCOUNTER — Encounter: Payer: Self-pay | Admitting: Internal Medicine

## 2013-08-03 ENCOUNTER — Telehealth: Payer: Self-pay | Admitting: *Deleted

## 2013-08-03 ENCOUNTER — Other Ambulatory Visit: Payer: Self-pay | Admitting: Internal Medicine

## 2013-08-03 MED ORDER — PANTOPRAZOLE SODIUM 20 MG PO TBEC: 20.0000 mg | DELAYED_RELEASE_TABLET | Freq: Every day | ORAL | Status: DC

## 2013-08-03 NOTE — Telephone Encounter (Signed)
Pt called requesting Protonix Rx be decreased from 40mg  to 20mg .  Please advise

## 2013-08-03 NOTE — Telephone Encounter (Signed)
Changed to 20mg  - erx done- thanks

## 2013-08-07 ENCOUNTER — Telehealth: Payer: Self-pay | Admitting: Oncology

## 2013-08-07 NOTE — Telephone Encounter (Signed)
returned pt call adn lvm advising they call back to r/s

## 2013-08-10 ENCOUNTER — Telehealth: Payer: Self-pay | Admitting: Oncology

## 2013-08-10 NOTE — Telephone Encounter (Signed)
pt called back to r/s appt dut to being out of town...done...pt aware of new d.t

## 2013-08-12 ENCOUNTER — Ambulatory Visit: Payer: Medicare Other | Admitting: Oncology

## 2013-08-12 ENCOUNTER — Other Ambulatory Visit: Payer: Medicare Other

## 2013-08-20 ENCOUNTER — Encounter: Payer: Self-pay | Admitting: Cardiology

## 2013-08-20 ENCOUNTER — Ambulatory Visit (INDEPENDENT_AMBULATORY_CARE_PROVIDER_SITE_OTHER): Payer: Medicare Other | Admitting: Cardiology

## 2013-08-20 VITALS — BP 162/70 | HR 68 | Ht 62.0 in | Wt 151.8 lb

## 2013-08-20 DIAGNOSIS — E785 Hyperlipidemia, unspecified: Secondary | ICD-10-CM

## 2013-08-20 DIAGNOSIS — K589 Irritable bowel syndrome without diarrhea: Secondary | ICD-10-CM

## 2013-08-20 NOTE — Patient Instructions (Addendum)
Your physician recommends that you continue on your current medications as directed. Please refer to the Current Medication list given to you today.  Your physician wants you to follow-up in: 6 month. You will receive a reminder letter in the mail two months in advance. If you don't receive a letter, please call our office to schedule the follow-up appointment.  

## 2013-08-20 NOTE — Progress Notes (Signed)
HPI The patient presents for evaluation of PVD.  Since I last saw her she has done well.  The patient denies any new symptoms such as chest discomfort or neck  discomfort. There has been no new shortness of breath, PND or orthopnea. There have been no reported palpitations, presyncope or syncope.  She walks routinely for exercise when the weather allows.  She has rarely gotten some arm discomfort she moves a certain way foot she says this is not reproducible with activity.  Allergies  Allergen Reactions  . Aspirin Other (See Comments)    ITP  . Contrast Media [Iodinated Diagnostic Agents]     hives    Current Outpatient Prescriptions  Medication Sig Dispense Refill  . Biotin 2500 MCG CAPS Take by mouth daily.      . Calcium Citrate (CITRACAL PO) One capful three times a week      . Calcium-Magnesium-Vitamin D (CITRACAL CALCIUM+D) 600-40-500 MG-MG-UNIT TB24 Take 1 tablet by mouth daily.       . clotrimazole (LOTRIMIN) 1 % external solution       . dorzolamide-timolol (COSOPT) 22.3-6.8 MG/ML ophthalmic solution       . Flaxseed MISC by Does not apply route. Take 1 tsp daily      . Magnesium 250 MG TABS Take 250 mg by mouth daily.      . Multiple Vitamin (MULTIVITAMIN) tablet Take 1 tablet by mouth daily.        . pantoprazole (PROTONIX) 20 MG tablet Take 1 tablet (20 mg total) by mouth daily.  90 tablet  1  . polyethylene glycol (MIRALAX / GLYCOLAX) packet Take 17 g by mouth every 3 (three) days.      . predniSONE (DELTASONE) 2.5 MG tablet TAKE ONE TABLET BY MOUTH ONE TIME DAILY   30 tablet  3  . Probiotic Product (PROBIOTIC FORMULA PO) Take 1 tablet by mouth daily.        . ranitidine (ZANTAC) 150 MG tablet Take 1 tablet (150 mg total) by mouth as needed for heartburn.  30 tablet  2  . simethicone (PHAZYME) 125 MG chewable tablet Chew 1 tablet (125 mg total) by mouth every 6 (six) hours as needed for flatulence.  30 tablet  0   No current facility-administered medications for this  visit.    Past Medical History  Diagnosis Date  . OSTEOARTHRITIS, KNEE, RIGHT   . URINARY INCONTINENCE   . Irritable bowel syndrome   . DIVERTICULITIS, HX OF   . OSTEOPENIA   . Immune thrombocytopenic purpura     chronic - baseline 80-100K, on pred  . UNSPECIFIED PERIPHERAL VASCULAR DISEASE   . OVERACTIVE BLADDER   . DYSLIPIDEMIA   . Glaucoma   . GERD   . HOH (hard of hearing)   . CAD (coronary artery disease)     RCA 40% stenosis cath 01/2011  . Left ovarian cyst dx 01/2013 CT    working with gyn, ?malignant - elevated tumor marker OVA1    Past Surgical History  Procedure Laterality Date  . Splenectomy  1954  . Abdominal hysterectomy  1963  . L pop pta  10/2009  . Laparoscopic sigmoid colectomy  10/2005  . Cataract extraction, bilateral  10/2010  . Knee arthroscopy Right   . Angioplasty    . Cardiac catheterization      ROS:  As stated in the HPI and negative for all other systems.   PHYSICAL EXAM BP 162/70  Pulse 68  Ht 5'  2" (1.575 m)  Wt 151 lb 12.8 oz (68.856 kg)  BMI 27.76 kg/m2 GENERAL:  Well appearing NECK:  No jugular venous distention, waveform within normal limits, carotid upstroke brisk and symmetric, no bruits, no thyromegaly LUNGS:  Clear to auscultation bilaterally CHEST:  Unremarkable HEART:  PMI not displaced or sustained,S1 and S2 within normal limits, no S3, no S4, no clicks, no rubs, no murmurs ABD:  Flat, positive bowel sounds normal in frequency in pitch, no bruits, no rebound, no guarding, no midline pulsatile mass, no hepatomegaly, no splenomegaly EXT:  2 plus pulses throughout, no edema, no cyanosis no clubbing  EKG:  Sinus rhythm, rate 68, axis within normal limits, intervals within normal limits, no acute ST-T wave changes.  08/20/2013  ASSESSMENT AND PLAN  PVD (peripheral vascular disease) -  She has no new symptoms. She will continue with risk reduction.  DYSLIPIDEMIA -  She does not want to take statins.  No change in therapy is  indicated.    ARM PAIN - She mentioned this but it is very rare. It is not reproducible with exercise therefore, at this point I would not consider further evaluation although she will let me know if it becomes a more frequent symptoms.

## 2013-08-21 ENCOUNTER — Ambulatory Visit (INDEPENDENT_AMBULATORY_CARE_PROVIDER_SITE_OTHER): Payer: Medicare Other

## 2013-08-21 ENCOUNTER — Encounter: Payer: Self-pay | Admitting: Podiatrist

## 2013-08-21 ENCOUNTER — Encounter: Payer: Self-pay | Admitting: Internal Medicine

## 2013-08-21 ENCOUNTER — Ambulatory Visit (INDEPENDENT_AMBULATORY_CARE_PROVIDER_SITE_OTHER): Payer: Medicare Other | Admitting: Podiatrist

## 2013-08-21 VITALS — BP 149/73 | HR 71 | Resp 17 | Ht 62.0 in | Wt 146.0 lb

## 2013-08-21 DIAGNOSIS — B351 Tinea unguium: Secondary | ICD-10-CM

## 2013-08-21 DIAGNOSIS — M79609 Pain in unspecified limb: Secondary | ICD-10-CM

## 2013-08-21 DIAGNOSIS — M2040 Other hammer toe(s) (acquired), unspecified foot: Secondary | ICD-10-CM

## 2013-08-21 MED ORDER — ZOLPIDEM TARTRATE 10 MG PO TABS: 10.0000 mg | ORAL_TABLET | Freq: Every evening | ORAL | Status: AC | PRN

## 2013-08-21 NOTE — Progress Notes (Signed)
Subjective:    Patient ID: Tamara Robertson, female    DOB: 11-Nov-1929, 80 y.o.   MRN: [id number]  HPI Comments: N ingrown toenails L B/L 1st toenails both borders D 20 years O on and off C painful encurvated toenails A enclosed shoes T hx of treatment by Dr Charlsie Merles for foot problems, no treatments tried  Pt request trim of the corn on the Right 2nd medial toe, began 5 months ago and uses a spacer pad.      Review of Systems  All other systems reviewed and are negative.      Objective:   Physical Exam GENERAL APPEARANCE: Alert, conversant. Appropriately groomed. No acute distress.  VASCULAR: Pedal pulses palpable at 1/4 DP and PT bilateral.  Capillary refill time is immediate to all digits,  Proximal to distal cooling it warm to warm.  Digital hair growth is present bilateral  NEUROLOGIC: sensation is intact epicritically and protectively to 5.07 monofilament at 5/5 sites bilateral.  Light touch is intact bilateral, vibratory sensation intact bilateral, achilles tendon reflex is intact bilateral.  MUSCULOSKELETAL: acceptable muscle strength, tone and stability bilateral. Hammertoe right 2nd.   DERMATOLOGIC: toenails are thick and dystrophic.  They are painful and incurvated.  Callus is present medial side of the right 2nd toe.          Assessment & Plan:  Hammertoe, onychomycosis  Corn debrided, nails debrided.  Patient will be seen back prn

## 2013-08-21 NOTE — Patient Instructions (Signed)

## 2013-08-24 NOTE — Telephone Encounter (Signed)
Faxed rx to [facility]...Johny Chess

## 2013-09-08 ENCOUNTER — Ambulatory Visit (INDEPENDENT_AMBULATORY_CARE_PROVIDER_SITE_OTHER): Payer: Medicare Other | Admitting: Internal Medicine

## 2013-09-08 ENCOUNTER — Encounter: Payer: Self-pay | Admitting: Internal Medicine

## 2013-09-08 VITALS — BP 138/70 | HR 74 | Temp 97.3°F | Ht 62.0 in | Wt 153.0 lb

## 2013-09-08 DIAGNOSIS — E785 Hyperlipidemia, unspecified: Secondary | ICD-10-CM

## 2013-09-08 DIAGNOSIS — B3789 Other sites of candidiasis: Secondary | ICD-10-CM

## 2013-09-08 DIAGNOSIS — Z0000 Encounter for general adult medical examination without abnormal findings: Secondary | ICD-10-CM

## 2013-09-08 MED ORDER — NYSTATIN 100000 UNIT/GM EX POWD: 1.0000 g | Freq: Two times a day (BID) | CUTANEOUS | Status: DC

## 2013-09-08 NOTE — Assessment & Plan Note (Signed)
Prev on prava, stopped same 02/2013 because of myalgia - check annually, last lipids reviewed -  consider alt statin if needed given CAD, but pt is hesitant to resume "at my age" 

## 2013-09-08 NOTE — Patient Instructions (Signed)
It was good to see you today.  We have reviewed your prior records including labs and tests today  Health Maintenance reviewed - all recommended immunizations and age-appropriate screenings are up-to-date.  Test(s) ordered today. Return next week when you are fasting. Your results will be released to [facility] (or called to you) after review, usually within 72hours after test completion. If any changes need to be made, you will be notified at that same time.  Medications reviewed and updated Use prescription powder for groin rash as needed - no other changes recommended at this time.  Please schedule followup in 6 months for semiannual exam and labs, call sooner if problems.

## 2013-09-08 NOTE — Progress Notes (Signed)
Pre visit review using our clinic review tool, if applicable. No additional management support is needed unless otherwise documented below in the visit note. 

## 2013-09-08 NOTE — Progress Notes (Signed)
Subjective:    Patient ID: MAHKENZIE LYDEN, female    DOB: Jan 05, 1930, 80 y.o.   MRN: [id number]  HPI   Here for medicare wellness  Diet: heart healthy or DM if diabetic Physical activity: sedentary Depression/mood screen: negative Hearing: intact to whispered voice Visual acuity: grossly normal, performs annual eye exam  ADLs: capable Fall risk: none Home safety: good Cognitive evaluation: intact to orientation, naming, recall and repetition EOL planning: adv directives, full code/ I agree  I have personally reviewed and have noted 1. The patient's medical and social history 2. Their use of alcohol, tobacco or illicit drugs 3. Their current medications and supplements 4. The patient's functional ability including ADL's, fall risks, home safety risks and hearing or visual impairment. 5. Diet and physical activities 6. Evidence for depression or mood disorders  Also reviewed chronic medical issues and interval medical events  Past Medical History  Diagnosis Date  . OSTEOARTHRITIS, KNEE, RIGHT   . URINARY INCONTINENCE   . Irritable bowel syndrome   . DIVERTICULITIS, HX OF   . OSTEOPENIA   . Immune thrombocytopenic purpura     chronic - baseline 80-100K, on pred  . UNSPECIFIED PERIPHERAL VASCULAR DISEASE   . OVERACTIVE BLADDER   . DYSLIPIDEMIA   . Glaucoma   . GERD   . HOH (hard of hearing)   . CAD (coronary artery disease)     RCA 40% stenosis cath 01/2011  . Left ovarian cyst dx 01/2013 CT    working with gyn, ?malignant - elevated tumor marker OVA1   Family History  Problem Relation Age of Onset  . Coronary artery disease Mother   . Heart attack Mother 42  . Hyperlipidemia Mother   . Hypertension Mother   . Stomach cancer Father   . Hypertension Daughter   . Hyperlipidemia Daughter   . Arthritis      parent  . Transient ischemic attack      parent  . Colon cancer Neg Hx    History  Substance Use Topics  . Smoking status: Never Smoker   . Smokeless  tobacco: Never Used     Comment: Married, lives with spouse. retired Futures trader. [place] Ham to [place] from [place] 05/2010 to be close to kids  . Alcohol Use: Yes     Comment: rarely    Review of Systems  Constitutional: Negative for fever, fatigue and unexpected weight change.  Respiratory: Negative for cough, shortness of breath and wheezing.   Cardiovascular: Negative for chest pain, palpitations and leg swelling.  Gastrointestinal: Negative for nausea, abdominal pain and diarrhea.  Skin: Positive for rash (beneath breasts and groin, intermittent).  Neurological: Negative for dizziness, weakness, light-headedness and headaches.  Psychiatric/Behavioral: Negative for dysphoric mood. The patient is not nervous/anxious.   All other systems reviewed and are negative.      Objective:   Physical Exam  BP 138/70  Pulse 74  Temp(Src) 97.3 F (36.3 C) (Oral)  Ht 5\' 2"  (1.575 m)  Wt 153 lb (69.4 kg)  BMI 27.98 kg/m2  SpO2 97% Wt Readings from Last 3 Encounters:  09/08/13 153 lb (69.4 kg)  08/21/13 146 lb (66.225 kg)  08/20/13 151 lb 12.8 oz (68.856 kg)   Constitutional: She appears well-developed and well-nourished. No distress.  HENT: Head: Normocephalic and atraumatic. Ears: hearing aide; B TMs ok, no erythema or effusion; Nose: Nose normal. Mouth/Throat: Oropharynx is clear and moist. No oropharyngeal exudate.  Eyes: Conjunctivae and EOM are normal. Pupils are  equal, round, and reactive to light. No scleral icterus.  Neck: Normal range of motion. Neck supple. No JVD present. No thyromegaly present.  Cardiovascular: Normal rate, regular rhythm and normal heart sounds.  No murmur heard. No BLE edema. Pulmonary/Chest: Effort normal and breath sounds normal. No respiratory distress. She has no wheezes.  Abdominal: Soft. Bowel sounds are normal. She exhibits no distension. There is no tenderness. no masses Musculoskeletal: r knee - boggy synovitis - tender to palpation over joint  line; FROM and ligamentous function intact. otherwise, normal range of motion, no joint effusions. No gross deformities Neurological: She is alert and oriented to person, place, and time. No cranial nerve deficit. Coordination, balance, strength, speech and gait are normal.  Skin: candida rash R groin -remaining skin is warm and dry. No rash noted. No erythema.  Psychiatric: She has a normal mood and affect. Her behavior is normal. Judgment and thought content normal.     Lab Results  Component Value Date   WBC 11.3* 02/11/2013   HGB 13.0 02/11/2013   HCT 39.2 02/11/2013   PLT 123* 02/11/2013   GLUCOSE 82 02/11/2013   CHOL 185 09/05/2012   TRIG 174.0* 09/05/2012   HDL 46.70 09/05/2012   LDLDIRECT 138.1 01/23/2011   LDLCALC 104* 09/05/2012   ALT 15 02/11/2013   AST 19 02/11/2013   NA 143 02/11/2013   K 4.3 02/11/2013   CL 106 01/29/2013   CREATININE 0.8 02/11/2013   BUN 16.7 02/11/2013   CO2 28 02/11/2013   TSH 2.95 06/03/2012   INR 1.1* 02/16/2011   HGBA1C 5.9 06/03/2012    Dg Bone Density  07/19/2013   Findings : lowest T score - 1.8 @  Femoral neck Diagnosis: Osteopenia with increased FRAX risk @ hip  Repeat BMD every 25 months.         Assessment & Plan:   AWV/CPX/v70.0 - Today patient counseled on age appropriate routine health concerns for screening and prevention, each reviewed and up to date or declined. Immunizations reviewed and up to date or declined. Labs ordered and reviewed. Risk factors for depression reviewed and negative. Hearing function and visual acuity are intact. ADLs screened and addressed as needed. Functional ability and level of safety reviewed and appropriate. Education, counseling and referrals performed based on assessed risks today. Patient provided with a copy of personalized plan for preventive services.  Problem List Items Addressed This Visit   DYSLIPIDEMIA     Prev on prava, stopped same 02/2013 because of myalgia - check annually, last lipids reviewed -    consider alt statin if needed given CAD, but pt is hesitant to resume "at my age"     Relevant Orders      Lipid panel    Other Visit Diagnoses   Routine general medical examination at a health care facility    -  Primary    Candida rash of groin        Relevant Medications       Nystatin (MYCOSTATIN) 100,000 units/Gm top powder      Candida rash, prior dermatology evaluation for same reviewed. Change to over-the-counter [place] powder to nystatin powder. Reassurance and education provided

## 2013-09-10 ENCOUNTER — Encounter: Payer: Self-pay | Admitting: Internal Medicine

## 2013-09-23 ENCOUNTER — Telehealth: Payer: Self-pay | Admitting: Oncology

## 2013-09-23 ENCOUNTER — Ambulatory Visit (HOSPITAL_BASED_OUTPATIENT_CLINIC_OR_DEPARTMENT_OTHER): Payer: Medicare Other | Admitting: Oncology

## 2013-09-23 ENCOUNTER — Other Ambulatory Visit (HOSPITAL_BASED_OUTPATIENT_CLINIC_OR_DEPARTMENT_OTHER): Payer: Medicare Other

## 2013-09-23 ENCOUNTER — Encounter: Payer: Self-pay | Admitting: Oncology

## 2013-09-23 VITALS — BP 169/69 | HR 71 | Temp 97.6°F | Resp 20 | Ht 62.0 in | Wt 152.8 lb

## 2013-09-23 DIAGNOSIS — D693 Immune thrombocytopenic purpura: Secondary | ICD-10-CM

## 2013-09-23 LAB — CBC WITH DIFFERENTIAL/PLATELET
BASO%: 0.7 % (ref 0.0–2.0)
Basophils Absolute: 0.1 10*3/uL (ref 0.0–0.1)
EOS%: 1.3 % (ref 0.0–7.0)
Eosinophils Absolute: 0.1 10*3/uL (ref 0.0–0.5)
HCT: 42.3 % (ref 34.8–46.6)
HGB: 13.8 g/dL (ref 11.6–15.9)
LYMPH%: 29.1 % (ref 14.0–49.7)
MCH: 27.8 pg (ref 25.1–34.0)
MCHC: 32.6 g/dL (ref 31.5–36.0)
MCV: 85.2 fL (ref 79.5–101.0)
MONO#: 0.9 10*3/uL (ref 0.1–0.9)
MONO%: 8.4 % (ref 0.0–14.0)
NEUT#: 6.8 10*3/uL — ABNORMAL HIGH (ref 1.5–6.5)
NEUT%: 60.5 % (ref 38.4–76.8)
Platelets: 142 10*3/uL — ABNORMAL LOW (ref 145–400)
RBC: 4.96 10*6/uL (ref 3.70–5.45)
RDW: 13.7 % (ref 11.2–14.5)
WBC: 11.3 10*3/uL — ABNORMAL HIGH (ref 3.9–10.3)
lymph#: 3.3 10*3/uL (ref 0.9–3.3)

## 2013-09-23 LAB — COMPREHENSIVE METABOLIC PANEL (CC13)
ALK PHOS: 95 U/L (ref 40–150)
ALT: 17 U/L (ref 0–55)
AST: 22 U/L (ref 5–34)
Albumin: 4 g/dL (ref 3.5–5.0)
Anion Gap: 11 mEq/L (ref 3–11)
BUN: 17.5 mg/dL (ref 7.0–26.0)
CALCIUM: 9.6 mg/dL (ref 8.4–10.4)
CHLORIDE: 108 meq/L (ref 98–109)
CO2: 25 mEq/L (ref 22–29)
CREATININE: 0.8 mg/dL (ref 0.6–1.1)
Glucose: 123 mg/dl (ref 70–140)
POTASSIUM: 4.4 meq/L (ref 3.5–5.1)
Sodium: 144 mEq/L (ref 136–145)
Total Bilirubin: 0.37 mg/dL (ref 0.20–1.20)
Total Protein: 7.4 g/dL (ref 6.4–8.3)

## 2013-09-23 NOTE — Progress Notes (Signed)
Hematology and Oncology Follow Up Visit  Tamara Robertson [id number] 1930/02/15 80 y.o. 09/23/2013 1:34 PM   Principle Diagnosis: 80 year old with chronic, relapsing ITP diagnosed in the 22s.   Prior Therapy: Patient S/P splenectomy and subsequently treated with high doses of steroids. The patient have had a complete response to steroids back in the 60s and all the way have had a few relapses. Every time she has a relapse she gets restarted on high-dose of steroids and she achieved a complete response. The most recent of relapses before her move to [facility] she was hospitalized for a platelet count of 8000 around the year 2000.  Current therapy: Prednisone to 2.5 mg every other day.   Interim History: Tamara Robertson returns today for a follow up visit by herself. She has been doing well since the last visit. No new complaints. She is completely asymptomatic at this point. She she is not reporting any bleeding, easy bruisability, or any skin changes. She has not reported any hematochezia or melena she has not reported any epistaxis or hemoptysis. She has not reported any headaches or blurry vision or double vision. Has not reported any chest pain cough or hemoptysis. Has not reported any nausea or vomiting or abdominal pain. Has not reported any frequency urgency or hematuria. Rest or view of systems unremarkable.   Medications: I have reviewed the patient's current medications. Unchanged by my review. Current Outpatient Prescriptions  Medication Sig Dispense Refill  . Biotin 2500 MCG CAPS Take by mouth daily.      . Calcium Citrate (CITRACAL PO) One capful three times a week      . Calcium-Magnesium-Vitamin D (CITRACAL CALCIUM+D) 600-40-500 MG-MG-UNIT TB24 Take 1 tablet by mouth daily.       . clotrimazole (LOTRIMIN) 1 % external solution       . dorzolamide-timolol (COSOPT) 22.3-6.8 MG/ML ophthalmic solution       . Flaxseed MISC by Does not apply route. Take 1 tsp daily      . Magnesium 250 MG  TABS Take 250 mg by mouth daily.      . Multiple Vitamin (MULTIVITAMIN) tablet Take 1 tablet by mouth daily.        [name] nystatin (MYCOSTATIN/NYSTOP) 100000 UNIT/GM POWD Apply 1 g topically 2 (two) times daily.  60 g  0  . pantoprazole (PROTONIX) 20 MG tablet Take 1 tablet (20 mg total) by mouth daily.  90 tablet  1  . polyethylene glycol (MIRALAX / GLYCOLAX) packet Take 17 g by mouth every 3 (three) days.      . predniSONE (DELTASONE) 2.5 MG tablet TAKE ONE TABLET BY MOUTH ONE TIME DAILY   30 tablet  3  . Probiotic Product (PROBIOTIC FORMULA PO) Take 1 tablet by mouth daily.        . ranitidine (ZANTAC) 150 MG tablet Take 1 tablet (150 mg total) by mouth as needed for heartburn.  30 tablet  2  . simethicone (PHAZYME) 125 MG chewable tablet Chew 1 tablet (125 mg total) by mouth every 6 (six) hours as needed for flatulence.  30 tablet  0  . zolpidem (AMBIEN) 10 MG tablet        No current facility-administered medications for this visit.    Allergies:  Allergies  Allergen Reactions  . Aspirin Other (See Comments)    ITP  . Contrast Media [Iodinated Diagnostic Agents]     hives  . Statins     Past Medical History, Surgical history, Social history,  and Family History were reviewed and updated.  Physical Exam: Blood pressure 169/69, pulse 71, temperature 97.6 F (36.4 C), temperature source Oral, resp. rate 20, height 5\' 2"  (1.575 m), weight 152 lb 12.8 oz (69.31 kg). ECOG: 1 General appearance: alert awake not in any distress. Head: Normocephalic, without obvious abnormality, atraumatic Neck: no adenopathy, no thyroid masses. Lymph nodes: Cervical, supraclavicular, and axillary nodes normal. Heart:regular rate and rhythm, S1, S2 normal, no murmur, click, rub or gallop Lung:chest clear, no wheezing, rales, normal symmetric air entry Abdomin: soft, non-tender, without masses or organomegaly EXT:no erythema, induration, or nodules Neurologically intact.   Lab Results: Lab Results   Component Value Date   WBC 11.3* 09/23/2013   HGB 13.8 09/23/2013   HCT 42.3 09/23/2013   MCV 85.2 09/23/2013   PLT 142* 09/23/2013     Chemistry      Component Value Date/Time   NA 143 02/11/2013 1001   NA 142 01/29/2013 0954   K 4.3 02/11/2013 1001   K 4.8 01/29/2013 0954   CL 106 01/29/2013 0954   CL 105 04/17/2012 1452   CO2 28 02/11/2013 1001   CO2 30 01/29/2013 0954   BUN 16.7 02/11/2013 1001   BUN 17 01/29/2013 0954   CREATININE 0.8 02/11/2013 1001   CREATININE 0.8 01/29/2013 0954      Component Value Date/Time   CALCIUM 9.3 02/11/2013 1001   CALCIUM 9.6 01/29/2013 0954   ALKPHOS 80 02/11/2013 1001   ALKPHOS 72 12/19/2012 1154   AST 19 02/11/2013 1001   AST 21 12/19/2012 1154   ALT 15 02/11/2013 1001   ALT 16 12/19/2012 1154   BILITOT 0.44 02/11/2013 1001   BILITOT 0.6 12/19/2012 1154      Impression and Plan:   80 year old female with the following issues:  1. Chronic relapsing ITP: She had multiple relapses in the past that is highly responsive to prednisone. She is currently on low-dose maintenance which seems to be effective in the limiting her relapses. Her platelet count is close to normal at this point and no further intervention is needed. I plan on keeping her at this current prednisone dose without any changes.  If she has a drop below 50 K, I will treat her with high dose steroids at that time.   2. Followup: In 6 months.         Benjiman Core, MD 5/20/20151:34 PM

## 2013-09-23 NOTE — Telephone Encounter (Signed)
Gave pt appt for lab and MD for november 2015

## 2013-09-30 ENCOUNTER — Encounter: Payer: Self-pay | Admitting: Internal Medicine

## 2013-10-01 MED ORDER — HYDROCHLOROTHIAZIDE 12.5 MG PO TABS: 12.5000 mg | ORAL_TABLET | Freq: Every day | ORAL | Status: DC | PRN

## 2013-10-01 MED ORDER — NYSTATIN 100000 UNIT/GM EX POWD: 1.0000 g | Freq: Two times a day (BID) | CUTANEOUS | Status: DC

## 2013-10-09 DIAGNOSIS — M2040 Other hammer toe(s) (acquired), unspecified foot: Secondary | ICD-10-CM

## 2013-11-16 ENCOUNTER — Encounter (HOSPITAL_COMMUNITY): Payer: Self-pay | Admitting: Emergency Medicine

## 2013-11-16 ENCOUNTER — Encounter: Payer: Self-pay | Admitting: Internal Medicine

## 2013-11-16 ENCOUNTER — Emergency Department (HOSPITAL_COMMUNITY)
Admission: EM | Admit: 2013-11-16 | Discharge: 2013-11-16 | Disposition: A | Discharge status: Home / Self Care | Payer: Medicare Other | Attending: Emergency Medicine | Admitting: Emergency Medicine

## 2013-11-16 DIAGNOSIS — Z79899 Other long term (current) drug therapy: Secondary | ICD-10-CM | POA: Insufficient documentation

## 2013-11-16 DIAGNOSIS — N3090 Cystitis, unspecified without hematuria: Secondary | ICD-10-CM | POA: Insufficient documentation

## 2013-11-16 DIAGNOSIS — IMO0002 Reserved for concepts with insufficient information to code with codable children: Secondary | ICD-10-CM | POA: Insufficient documentation

## 2013-11-16 DIAGNOSIS — Z9889 Other specified postprocedural states: Secondary | ICD-10-CM | POA: Insufficient documentation

## 2013-11-16 DIAGNOSIS — I2510 Atherosclerotic heart disease of native coronary artery without angina pectoris: Secondary | ICD-10-CM | POA: Insufficient documentation

## 2013-11-16 DIAGNOSIS — Z862 Personal history of diseases of the blood and blood-forming organs and certain disorders involving the immune mechanism: Secondary | ICD-10-CM | POA: Insufficient documentation

## 2013-11-16 DIAGNOSIS — N3091 Cystitis, unspecified with hematuria: Secondary | ICD-10-CM

## 2013-11-16 DIAGNOSIS — Z8669 Personal history of other diseases of the nervous system and sense organs: Secondary | ICD-10-CM | POA: Insufficient documentation

## 2013-11-16 DIAGNOSIS — H9190 Unspecified hearing loss, unspecified ear: Secondary | ICD-10-CM | POA: Insufficient documentation

## 2013-11-16 DIAGNOSIS — Z8742 Personal history of other diseases of the female genital tract: Secondary | ICD-10-CM | POA: Insufficient documentation

## 2013-11-16 DIAGNOSIS — K219 Gastro-esophageal reflux disease without esophagitis: Secondary | ICD-10-CM | POA: Insufficient documentation

## 2013-11-16 DIAGNOSIS — Z87448 Personal history of other diseases of urinary system: Secondary | ICD-10-CM | POA: Insufficient documentation

## 2013-11-16 DIAGNOSIS — E785 Hyperlipidemia, unspecified: Secondary | ICD-10-CM | POA: Insufficient documentation

## 2013-11-16 DIAGNOSIS — G608 Other hereditary and idiopathic neuropathies: Secondary | ICD-10-CM | POA: Insufficient documentation

## 2013-11-16 DIAGNOSIS — M1710 Unilateral primary osteoarthritis, unspecified knee: Secondary | ICD-10-CM | POA: Insufficient documentation

## 2013-11-16 LAB — URINALYSIS, ROUTINE W REFLEX MICROSCOPIC
Bilirubin Urine: NEGATIVE
Glucose, UA: NEGATIVE mg/dL
Ketones, ur: NEGATIVE mg/dL
Nitrite: NEGATIVE
Protein, ur: 30 mg/dL — AB
Specific Gravity, Urine: 1.006 (ref 1.005–1.030)
Urobilinogen, UA: 0.2 mg/dL (ref 0.0–1.0)
pH: 8 (ref 5.0–8.0)

## 2013-11-16 LAB — CBC WITH DIFFERENTIAL/PLATELET
Basophils Absolute: 0 K/uL (ref 0.0–0.1)
Basophils Relative: 0 % (ref 0–1)
Eosinophils Absolute: 0.2 K/uL (ref 0.0–0.7)
Eosinophils Relative: 1 % (ref 0–5)
HCT: 40.8 % (ref 36.0–46.0)
Hemoglobin: 13.9 g/dL (ref 12.0–15.0)
Lymphocytes Relative: 21 % (ref 12–46)
Lymphs Abs: 3.7 K/uL (ref 0.7–4.0)
MCH: 28.5 pg (ref 26.0–34.0)
MCHC: 34.1 g/dL (ref 30.0–36.0)
MCV: 83.8 fL (ref 78.0–100.0)
Monocytes Absolute: 1.5 K/uL — ABNORMAL HIGH (ref 0.1–1.0)
Monocytes Relative: 9 % (ref 3–12)
Neutro Abs: 12.2 K/uL — ABNORMAL HIGH (ref 1.7–7.7)
Neutrophils Relative %: 69 % (ref 43–77)
Platelets: 162 K/uL (ref 150–400)
RBC: 4.87 MIL/uL (ref 3.87–5.11)
RDW: 13.6 % (ref 11.5–15.5)
WBC: 17.5 K/uL — ABNORMAL HIGH (ref 4.0–10.5)

## 2013-11-16 LAB — URINE MICROSCOPIC-ADD ON

## 2013-11-16 MED ORDER — SULFAMETHOXAZOLE-TMP DS 800-160 MG PO TABS
1.0000 | ORAL_TABLET | Freq: Once | ORAL | Status: AC
  Administered 2013-11-16: 1 via ORAL
  Filled 2013-11-16: qty 1

## 2013-11-16 MED ORDER — SULFAMETHOXAZOLE-TMP DS 800-160 MG PO TABS: 1.0000 | ORAL_TABLET | Freq: Two times a day (BID) | ORAL | Status: DC

## 2013-11-16 NOTE — Discharge Instructions (Signed)

## 2013-11-16 NOTE — ED Notes (Signed)
Pt states noted blood in toilet this morning.  Did not note bleeding in bed last night.  Pt has hx of UTI with same symptoms.  Pain with urination.  Pt just finished amoxicillan yesterday in preparation for dental surgery.  No fever

## 2013-11-17 LAB — URINE CULTURE: Colony Count: 30000

## 2013-11-25 NOTE — ED Provider Notes (Signed)
CSN: [id number]     Arrival date & time 11/16/13  1962 History   First MD Initiated Contact with Patient 11/16/13 1016     Chief Complaint  Patient presents with  . Hematuria     (Consider location/radiation/quality/duration/timing/severity/associated sxs/prior Treatment) Patient is a 80 y.o. female presenting with hematuria.  Hematuria This is a new problem. The current episode started 12 to 24 hours ago. The problem has not changed since onset.Pertinent negatives include no chest pain and no shortness of breath. Nothing aggravates the symptoms. Nothing relieves the symptoms. She has tried nothing for the symptoms.   84yf with hematuria since last night. Associated dysuria. No blood thinners. No dizziness, lightheadedness or SOB.   Past Medical History  Diagnosis Date  . OSTEOARTHRITIS, KNEE, RIGHT   . URINARY INCONTINENCE   . Irritable bowel syndrome   . DIVERTICULITIS, HX OF   . OSTEOPENIA   . Immune thrombocytopenic purpura     chronic - baseline 80-100K, on pred  . UNSPECIFIED PERIPHERAL VASCULAR DISEASE   . OVERACTIVE BLADDER   . DYSLIPIDEMIA   . Glaucoma   . GERD   . HOH (hard of hearing)   . CAD (coronary artery disease)     RCA 40% stenosis cath 01/2011  . Left ovarian cyst dx 01/2013 CT    working with gyn, ?malignant - elevated tumor marker OVA1   Past Surgical History  Procedure Laterality Date  . Splenectomy  1954  . Abdominal hysterectomy  1963  . L pop pta  10/2009  . Laparoscopic sigmoid colectomy  10/2005  . Cataract extraction, bilateral  10/2010  . Knee arthroscopy Right   . Angioplasty    . Cardiac catheterization     Family History  Problem Relation Age of Onset  . Coronary artery disease Mother   . Heart attack Mother 42  . Hyperlipidemia Mother   . Hypertension Mother   . Stomach cancer Father   . Hypertension Daughter   . Hyperlipidemia Daughter   . Arthritis      parent  . Transient ischemic attack      parent  . Colon cancer Neg Hx     History  Substance Use Topics  . Smoking status: Never Smoker   . Smokeless tobacco: Never Used     Comment: Married, lives with spouse. retired Control and instrumentation engineer. Dorie Rank to [place] from [place] 05/2010 to be close to kids  . Alcohol Use: Yes     Comment: rarely   OB History   Grav Para Term Preterm Abortions TAB SAB Ect Mult Living                 Review of Systems  Respiratory: Negative for shortness of breath.   Cardiovascular: Negative for chest pain.  Genitourinary: Positive for hematuria.    All systems reviewed and negative, other than as noted in HPI.   Allergies  Aspirin; Contrast media; and Statins  Home Medications   Prior to Admission medications   Medication Sig Start Date End Date Taking? Authorizing Provider  Biotin 2500 MCG CAPS Take by mouth daily.   Yes Historical Provider, MD  Calcium Citrate (CITRACAL PO) One capful three times a week   Yes Historical Provider, MD  Calcium-Magnesium-Vitamin D (CITRACAL CALCIUM+D) 600-40-500 MG-MG-UNIT TB24 Take 1 tablet by mouth daily.    Yes Historical Provider, MD  dorzolamide-timolol (COSOPT) 22.3-6.8 MG/ML ophthalmic solution Place 1 drop into both eyes 2 (two) times daily.  12/30/12  Yes Historical Provider,  MD  Flaxseed MISC by Does not apply route. Take 1 tsp daily   Yes Historical Provider, MD  hydrochlorothiazide (HYDRODIURIL) 12.5 MG tablet Take 1 tablet (12.5 mg total) by mouth daily as needed (swelling/edema). 10/01/13  Yes Rowe Clack, MD  Magnesium 250 MG TABS Take 250 mg by mouth daily.   Yes Historical Provider, MD  Multiple Vitamin (MULTIVITAMIN) tablet Take 1 tablet by mouth daily.     Yes Historical Provider, MD  nystatin (MYCOSTATIN/NYSTOP) 100000 UNIT/GM POWD Apply 1 g topically 2 (two) times daily. 10/01/13  Yes Rowe Clack, MD  pantoprazole (PROTONIX) 20 MG tablet Take 1 tablet (20 mg total) by mouth daily. 08/03/13  Yes Rowe Clack, MD  polyethylene glycol (MIRALAX / GLYCOLAX)  packet Take 17 g by mouth every 3 (three) days.   Yes Historical Provider, MD  predniSONE (DELTASONE) 2.5 MG tablet Take 2.5 mg by mouth daily.   Yes Historical Provider, MD  Probiotic Product (PROBIOTIC FORMULA PO) Take 1 tablet by mouth daily.     Yes Historical Provider, MD  ranitidine (ZANTAC) 150 MG tablet Take 1 tablet (150 mg total) by mouth as needed for heartburn. 01/29/13  Yes Jerene Bears, MD  simethicone (PHAZYME) 125 MG chewable tablet Chew 1 tablet (125 mg total) by mouth every 6 (six) hours as needed for flatulence. 06/15/13  Yes Jerene Bears, MD  sulfamethoxazole-trimethoprim (BACTRIM DS) 800-160 MG per tablet Take 1 tablet by mouth 2 (two) times daily. 11/16/13   Virgel Manifold, MD   BP 149/70  Pulse 78  Temp(Src) 98.4 F (36.9 C) (Oral)  Resp 18  SpO2 97% Physical Exam  Nursing note and vitals reviewed. Constitutional: She appears well-developed and well-nourished. No distress.  HENT:  Head: Normocephalic and atraumatic.  Eyes: Conjunctivae are normal. Right eye exhibits no discharge. Left eye exhibits no discharge.  Neck: Neck supple.  Cardiovascular: Normal rate, regular rhythm and normal heart sounds.  Exam reveals no gallop and no friction rub.   No murmur heard. Pulmonary/Chest: Effort normal and breath sounds normal. No respiratory distress.  Abdominal: Soft. She exhibits no distension. There is no tenderness.  Genitourinary:  No cva tenderness  Musculoskeletal: She exhibits no edema and no tenderness.  Neurological: She is alert.  Skin: Skin is warm and dry.  Psychiatric: She has a normal mood and affect. Her behavior is normal. Thought content normal.    ED Course  Procedures (including critical care time) Labs Review Labs Reviewed  URINALYSIS, ROUTINE W REFLEX MICROSCOPIC - Abnormal; Notable for the following:    APPearance CLOUDY (*)    Hgb urine dipstick LARGE (*)    Protein, ur 30 (*)    Leukocytes, UA MODERATE (*)    All other components within  normal limits  CBC WITH DIFFERENTIAL - Abnormal; Notable for the following:    WBC 17.5 (*)    Neutro Abs 12.2 (*)    Monocytes Absolute 1.5 (*)    All other components within normal limits  URINE CULTURE  URINE MICROSCOPIC-ADD ON    Imaging Review No results found.   EKG Interpretation None      MDM   Final diagnoses:  Hemorrhagic cystitis    84yF with hematuria. Possible hemorrhagic cystitis. Abx. Culture sent. Return precautions discussed. Needs re-eval if hematuria persists.     Virgel Manifold, MD 11/25/13 [phone]

## 2013-11-27 ENCOUNTER — Ambulatory Visit: Payer: Medicare Other | Admitting: Podiatrist

## 2013-12-07 ENCOUNTER — Other Ambulatory Visit: Payer: Self-pay | Admitting: Internal Medicine

## 2013-12-10 ENCOUNTER — Ambulatory Visit (INDEPENDENT_AMBULATORY_CARE_PROVIDER_SITE_OTHER): Payer: Medicare Other | Admitting: Podiatrist

## 2013-12-10 DIAGNOSIS — M79673 Pain in unspecified foot: Secondary | ICD-10-CM

## 2013-12-10 DIAGNOSIS — B351 Tinea unguium: Secondary | ICD-10-CM

## 2013-12-10 DIAGNOSIS — M79609 Pain in unspecified limb: Secondary | ICD-10-CM

## 2013-12-10 NOTE — Patient Instructions (Signed)
The vein doctors at the vein in vascular surgeons at [facility] are Dr. Kellie Simmering or Dr. Donnetta Hutching they will be would help you with your left foot problem

## 2013-12-10 NOTE — Progress Notes (Signed)
HPI:  Patient presents today for follow up of foot and nail care. Denies any new complaints today.  Physical Exam  GENERAL APPEARANCE: Alert, conversant. Appropriately groomed. No acute distress.  VASCULAR: Pedal pulses palpable at 1/4 DP and PT bilateral. Capillary refill time is immediate to all digits, Proximal to distal cooling it warm to warm. Digital hair growth is present bilateral varicose veins are present and new varicosities are painful on the medial aspect of the left ankle. NEUROLOGIC: sensation is intact epicritically and protectively to 5.07 monofilament at 5/5 sites bilateral. Light touch is intact bilateral, vibratory sensation intact bilateral, achilles tendon reflex is intact bilateral.  MUSCULOSKELETAL: acceptable muscle strength, tone and stability bilateral. Hammertoe right 2nd.  DERMATOLOGIC: toenails are thick and dystrophic. They are painful and incurvated. Callus is present medial side of the right 2nd toe.   Assessment:  Symptomatic onychomycosis  Plan:  Discussed treatment options and alternatives.  The symptomatic toenails were debrided through manual an mechanical means without complication.  Recommended that she see Dr.  Kellie Simmering or [facility] at [facility] for evaluation of her vein problems. Return appointment recommended at routine intervals of 3 months    Trudie Buckler, DPM

## 2014-01-22 ENCOUNTER — Encounter: Payer: Self-pay | Admitting: Cardiology

## 2014-01-22 ENCOUNTER — Ambulatory Visit (INDEPENDENT_AMBULATORY_CARE_PROVIDER_SITE_OTHER): Payer: Medicare Other | Admitting: Cardiology

## 2014-01-22 ENCOUNTER — Encounter (HOSPITAL_COMMUNITY): Payer: Self-pay | Admitting: *Deleted

## 2014-01-22 VITALS — BP 138/68 | HR 71 | Ht 62.0 in | Wt 149.7 lb

## 2014-01-22 DIAGNOSIS — R079 Chest pain, unspecified: Secondary | ICD-10-CM

## 2014-01-22 DIAGNOSIS — I739 Peripheral vascular disease, unspecified: Secondary | ICD-10-CM

## 2014-01-22 NOTE — Progress Notes (Signed)
HPI The patient presents for evaluation of chest discomfort.  Since I last saw her she has done well.   She reports that since the last saw her she's been having some chest discomfort. This started about 3 weeks ago. It is an intermittent chest heaviness. She had one severe episode while watching TV. There's been much more mild since then. She can do activities such as mopping the floor without bringing this on. She does not have associated nausea vomiting or diaphoresis. She does not have associated palpitations, presyncope or syncope. She did have a little bit of ankle swelling.  She has had some elbow pain which is described previously which does not necessarily happen with her chest discomfort. She's felt fatigued and spacy.  Allergies  Allergen Reactions  . Aspirin Other (See Comments)    ITP  . Contrast Media [Iodinated Diagnostic Agents]     hives  . Statins     Current Outpatient Prescriptions  Medication Sig Dispense Refill  . Biotin 2500 MCG CAPS Take by mouth daily.      . Calcium Citrate (CITRACAL PO) One capful three times a week      . Calcium-Magnesium-Vitamin D (CITRACAL CALCIUM+D) 600-40-500 MG-MG-UNIT TB24 Take 1 tablet by mouth daily.       . dorzolamide-timolol (COSOPT) 22.3-6.8 MG/ML ophthalmic solution Place 1 drop into both eyes 2 (two) times daily.       . Flaxseed MISC by Does not apply route. Take 1 tsp daily      . hydrochlorothiazide (MICROZIDE) 12.5 MG capsule TAKE ONE CAPSULE BY MOUTH DAILY AS NEEDED for swelling and edema  30 capsule  2  . Magnesium 250 MG TABS Take 250 mg by mouth daily.      . Multiple Vitamin (MULTIVITAMIN) tablet Take 1 tablet by mouth daily.        [name] nystatin (MYCOSTATIN/NYSTOP) 100000 UNIT/GM POWD Apply 1 g topically 2 (two) times daily.  60 g  3  . pantoprazole (PROTONIX) 20 MG tablet Take 1 tablet (20 mg total) by mouth daily.  90 tablet  1  . polyethylene glycol (MIRALAX / GLYCOLAX) packet Take 17 g by mouth every 3 (three) days.       . predniSONE (DELTASONE) 2.5 MG tablet Take 2.5 mg by mouth daily.      . Probiotic Product (PROBIOTIC FORMULA PO) Take 1 tablet by mouth daily.        . ranitidine (ZANTAC) 150 MG tablet Take 1 tablet (150 mg total) by mouth as needed for heartburn.  30 tablet  2  . simethicone (PHAZYME) 125 MG chewable tablet Chew 1 tablet (125 mg total) by mouth every 6 (six) hours as needed for flatulence.  30 tablet  0   No current facility-administered medications for this visit.    Past Medical History  Diagnosis Date  . OSTEOARTHRITIS, KNEE, RIGHT   . URINARY INCONTINENCE   . Irritable bowel syndrome   . DIVERTICULITIS, HX OF   . OSTEOPENIA   . Immune thrombocytopenic purpura     chronic - baseline 80-100K, on pred  . UNSPECIFIED PERIPHERAL VASCULAR DISEASE   . OVERACTIVE BLADDER   . DYSLIPIDEMIA   . Glaucoma   . GERD   . HOH (hard of hearing)   . CAD (coronary artery disease)     RCA 40% stenosis cath 01/2011  . Left ovarian cyst dx 01/2013 CT    working with gyn, ?malignant - elevated tumor marker OVA1    Past  Surgical History  Procedure Laterality Date  . Splenectomy  1954  . Abdominal hysterectomy  1963  . L pop pta  10/2009  . Laparoscopic sigmoid colectomy  10/2005  . Cataract extraction, bilateral  10/2010  . Knee arthroscopy Right   . Angioplasty    . Cardiac catheterization      ROS:  As stated in the HPI and negative for all other systems.   PHYSICAL EXAM BP 138/68  Pulse 71  Ht 5\' 2"  (1.575 m)  Wt 149 lb 11.2 oz (67.903 kg)  BMI 27.37 kg/m2 GENERAL:  Well appearing NECK:  No jugular venous distention, waveform within normal limits, carotid upstroke brisk and symmetric, no bruits, no thyromegaly LUNGS:  Clear to auscultation bilaterally CHEST:  Unremarkable HEART:  PMI not displaced or sustained,S1 and S2 within normal limits, no S3, no S4, no clicks, no rubs, no murmurs ABD:  Flat, positive bowel sounds normal in frequency in pitch, no bruits, no rebound, no  guarding, no midline pulsatile mass, no hepatomegaly, no splenomegaly EXT:  2 plus pulses throughout, no edema, no cyanosis no clubbing  EKG:  Sinus rhythm, rate 71, axis within normal limits, intervals within normal limits, no acute ST-T wave changes.  01/22/2014  ASSESSMENT AND PLAN  CHEST PAIN - She needs to have a stress test.  However, she would not be able to walk on a treadmill.  She will have a [employer].  PVD (peripheral vascular disease) -  She has no new symptoms. She will continue with risk reduction.  DYSLIPIDEMIA -  She does not want to take statins.  No change in therapy is indicated.

## 2014-01-22 NOTE — Patient Instructions (Signed)
Your physician recommends that you schedule a follow-up appointment in: 3 months with Dr. Percival Spanish  WE are ordering a stress test

## 2014-01-29 ENCOUNTER — Ambulatory Visit (HOSPITAL_COMMUNITY)
Admission: RE | Admit: 2014-01-29 | Discharge: 2014-01-29 | Disposition: A | Discharge status: Home / Self Care | Payer: Medicare Other | Source: Ambulatory Visit | Attending: Cardiovascular Disease | Admitting: Cardiovascular Disease

## 2014-01-29 DIAGNOSIS — R0602 Shortness of breath: Secondary | ICD-10-CM | POA: Insufficient documentation

## 2014-01-29 DIAGNOSIS — R079 Chest pain, unspecified: Secondary | ICD-10-CM | POA: Diagnosis present

## 2014-01-29 DIAGNOSIS — R5383 Other fatigue: Secondary | ICD-10-CM | POA: Diagnosis not present

## 2014-01-29 DIAGNOSIS — I739 Peripheral vascular disease, unspecified: Secondary | ICD-10-CM | POA: Insufficient documentation

## 2014-01-29 DIAGNOSIS — E785 Hyperlipidemia, unspecified: Secondary | ICD-10-CM | POA: Diagnosis not present

## 2014-01-29 DIAGNOSIS — Z8249 Family history of ischemic heart disease and other diseases of the circulatory system: Secondary | ICD-10-CM | POA: Diagnosis not present

## 2014-01-29 DIAGNOSIS — R5381 Other malaise: Secondary | ICD-10-CM | POA: Diagnosis not present

## 2014-01-29 MED ORDER — TECHNETIUM TC 99M SESTAMIBI GENERIC - CARDIOLITE
30.4000 | Freq: Once | INTRAVENOUS | Status: AC | PRN
  Administered 2014-01-29: 30 via INTRAVENOUS

## 2014-01-29 MED ORDER — TECHNETIUM TC 99M SESTAMIBI GENERIC - CARDIOLITE
10.2000 | Freq: Once | INTRAVENOUS | Status: AC | PRN
  Administered 2014-01-29: 10 via INTRAVENOUS

## 2014-01-29 MED ORDER — AMINOPHYLLINE 25 MG/ML IV SOLN
75.0000 mg | Freq: Once | INTRAVENOUS | Status: AC
  Administered 2014-01-29: 75 mg via INTRAVENOUS

## 2014-01-29 MED ORDER — REGADENOSON 0.4 MG/5ML IV SOLN
0.4000 mg | Freq: Once | INTRAVENOUS | Status: AC
  Administered 2014-01-29: 0.4 mg via INTRAVENOUS

## 2014-01-29 NOTE — Procedures (Addendum)
[facility] [address] [place] [address] [phone]  Cardiology Nuclear Med Study  Tamara Robertson is a 80 y.o. female     MRN : [id number]     DOB: [birth year]  Procedure Date: 01/29/2014  Nuclear Med Background Indication for Stress Test:  Evaluation for Ischemia History:  CAD, no prior nuclear MPI Cardiac Risk Factors: Family History - CAD, Lipids and PVD  Symptoms:  Chest Pain, Fatigue and SOB   Nuclear Pre-Procedure Caffeine/Decaff Intake:  12:30am NPO After: 10:30am   IV Site: R Antecubital  IV 0.9% NS with Angio Cath:  22g  Chest Size (in):  n/a IV Started by: Otho Perl, CNMT  Height: 5\' 2"  (1.575 m)  Cup Size: 38D  BMI:  Body mass index is 27.25 kg/(m^2). Weight:  149 lb (67.586 kg)   Tech Comments:  n/a    Nuclear Med Study 1 or 2 day study: 1 day  Stress Test Type:  [place] Provider:  [name], MD   Resting Radionuclide: Technetium 55m Sestamibi  Resting Radionuclide Dose: 10.2 mCi   Stress Radionuclide:  Technetium 7m Sestamibi  Stress Radionuclide Dose: 30.4 mCi           Stress Protocol Rest HR: 65 Stress HR: 93  Rest BP: 149/74 Stress BP: 154/64  Exercise Time (min): n/a METS: n/a   Predicted Max HR: 138 bpm % Max HR: 68.84 bpm Rate Pressure Product: 14630  Dose of Adenosine (mg):  n/a Dose of Lexiscan: 0.4 mg  Dose of Atropine (mg): n/a Dose of Dobutamine: n/a mcg/kg/min (at max HR)  Stress Test Technologist: Leane Para, CCT Nuclear Technologist: Imagene Riches, CNMT   Rest Procedure:  Myocardial perfusion imaging was performed at rest 45 minutes following the intravenous administration of Technetium 84m Sestamibi. Stress Procedure:  The patient received IV Lexiscan 0.4 mg over 15-seconds.  Technetium 66m Sestamibi injected IV at 30-seconds.  Patient experienced Mild SOB, lightheadedness and headache and 75 mg Aminophylline IV was administered.There were no  significant changes with Lexiscan.  Quantitative spect images were obtained after a 45 minute delay.  Transient Ischemic Dilatation (Normal <1.22):  0.97  QGS EDV:  67 ml QGS ESV:  20 ml LV Ejection Fraction: 71%     Rest ECG: NSR - Normal EKG  Stress ECG: No significant change from baseline ECG and There are scattered PVCs.  QPS Raw Data Images:  There is interference from nuclear activity from structures below the diaphragm. This does not affect the ability to read the study. Stress Images:  Normal homogeneous uptake in all areas of the myocardium. Rest Images:  Normal homogeneous uptake in all areas of the myocardium. Subtraction (SDS):  No evidence of ischemia. LV Wall Motion:  NL LV Function; NL Wall Motion  Impression Exercise Capacity:  Lexiscan with no exercise. BP Response:  Normal blood pressure response. Clinical Symptoms:  No significant symptoms noted. ECG Impression:  No significant ECG changes with Lexiscan. Comparison with Prior Nuclear Study: No previous nuclear study performed   Overall Impression:  Normal stress nuclear study.   Sanda Klein, MD  01/29/2014 5:30 PM

## 2014-01-31 ENCOUNTER — Other Ambulatory Visit: Payer: Self-pay | Admitting: Internal Medicine

## 2014-02-05 ENCOUNTER — Encounter: Payer: Self-pay | Admitting: Cardiology

## 2014-03-10 ENCOUNTER — Encounter: Payer: Self-pay | Admitting: Internal Medicine

## 2014-03-10 ENCOUNTER — Ambulatory Visit (INDEPENDENT_AMBULATORY_CARE_PROVIDER_SITE_OTHER): Payer: Medicare Other | Admitting: Internal Medicine

## 2014-03-10 VITALS — BP 138/70 | HR 73 | Temp 97.9°F | Ht 62.0 in | Wt 151.0 lb

## 2014-03-10 DIAGNOSIS — R1013 Epigastric pain: Secondary | ICD-10-CM

## 2014-03-10 DIAGNOSIS — R739 Hyperglycemia, unspecified: Secondary | ICD-10-CM

## 2014-03-10 DIAGNOSIS — E785 Hyperlipidemia, unspecified: Secondary | ICD-10-CM

## 2014-03-10 MED ORDER — ALPRAZOLAM 0.25 MG PO TABS: 0.2500 mg | ORAL_TABLET | Freq: Two times a day (BID) | ORAL | Status: DC | PRN

## 2014-03-10 NOTE — Progress Notes (Signed)
Subjective:    Patient ID: Tamara Robertson, female    DOB: 05/26/1929, 80 y.o.   MRN: [id number]  HPI  Patient is here for follow up  Reviewed chronic medical issues and interval medical events  Past Medical History  Diagnosis Date  . OSTEOARTHRITIS, KNEE, RIGHT   . URINARY INCONTINENCE   . Irritable bowel syndrome   . DIVERTICULITIS, HX OF   . OSTEOPENIA   . Immune thrombocytopenic purpura     chronic - baseline 80-100K, on pred  . UNSPECIFIED PERIPHERAL VASCULAR DISEASE   . OVERACTIVE BLADDER   . DYSLIPIDEMIA   . Glaucoma   . GERD   . HOH (hard of hearing)   . CAD (coronary artery disease)     RCA 40% stenosis cath 01/2011  . Left ovarian cyst dx 01/2013 CT    working with gyn, ?malignant - elevated tumor marker OVA1    Review of Systems  Constitutional: Positive for fatigue. Negative for unexpected weight change.  Respiratory: Negative for cough and shortness of breath.   Cardiovascular: Positive for leg swelling (chronic, dep, end of the day worst). Negative for chest pain.  Gastrointestinal: Positive for abdominal pain (mild intermittent epigastric pain when bending forward, lasts 5-10", occurs 2-3x/mo for last 4-17mo). Negative for abdominal distention.       Objective:   Physical Exam  BP 138/70 mmHg  Pulse 73  Temp(Src) 97.9 F (36.6 C) (Oral)  Ht 5\' 2"  (1.575 m)  Wt 151 lb (68.493 kg)  BMI 27.61 kg/m2  SpO2 95% Wt Readings from Last 3 Encounters:  03/10/14 151 lb (68.493 kg)  01/29/14 149 lb (67.586 kg)  01/22/14 149 lb 11.2 oz (67.903 kg)   Constitutional: She is overweight, appears well-developed and well-nourished. No distress.  Neck: Normal range of motion. Neck supple. No JVD present. No thyromegaly present.  Cardiovascular: Normal rate, regular rhythm and normal heart sounds.  No murmur heard. trace BLE edema. Pulmonary/Chest: Effort normal and breath sounds normal. No respiratory distress. She has no wheezes.  Abdomen: SNTND, +BS, no mass - mild  reproducible tenderness over anterior end of remote speenectomy scar below xiphoid, no apparent hernia Psychiatric: She has a normal mood and affect. Her behavior is normal. Judgment and thought content normal.   Lab Results  Component Value Date   WBC 17.5* 11/16/2013   HGB 13.9 11/16/2013   HCT 40.8 11/16/2013   PLT 162 11/16/2013   GLUCOSE 123 09/23/2013   CHOL 185 09/05/2012   TRIG 174.0* 09/05/2012   HDL 46.70 09/05/2012   LDLDIRECT 138.1 01/23/2011   LDLCALC 104* 09/05/2012   ALT 17 09/23/2013   AST 22 09/23/2013   NA 144 09/23/2013   K 4.4 09/23/2013   CL 106 01/29/2013   CREATININE 0.8 09/23/2013   BUN 17.5 09/23/2013   CO2 25 09/23/2013   TSH 2.95 06/03/2012   INR 1.1* 02/16/2011   HGBA1C 5.9 06/03/2012    No results found.     Assessment & Plan:   Abdomen pain, epigastric - movement induced (bending forward), but intermittent (inconsistent reproduction) - ?inscional hernia - check [place] - recent labs ok - reassurance provided  Problem List Items Addressed This Visit    Hyperlipidemia    Prev on prava, stopped same 02/2013 because of myalgia - check annually, last lipids reviewed -  consider alt statin if needed given CAD, but pt is hesitant to resume "at my age"    Relevant Orders      Lipid  panel    Other Visit Diagnoses    Abdominal pain, epigastric    -  Primary    Relevant Orders       US Abdomen Complete    Hyperglycemia        Relevant Orders       Hemoglobin A1c

## 2014-03-10 NOTE — Progress Notes (Signed)
Pre visit review using our clinic review tool, if applicable. No additional management support is needed unless otherwise documented below in the visit note. 

## 2014-03-10 NOTE — Patient Instructions (Signed)
It was good to see you today.  We have reviewed your prior records including labs and tests today  Test(s) ordered today - return when fasting for lipid/choesterol. Your results will be released to [place] (or called to you) after review, usually within 72hours after test completion. If any changes need to be made, you will be notified at that same time.  Medications reviewed and updated, no changes recommended at this time. Refill on medication(s) as discussed today.  we'll make referral for ultrasound of abdomen. Our office will contact you regarding appointment(s) once made.  Please schedule followup in 3-6 months, call sooner if problems.

## 2014-03-10 NOTE — Assessment & Plan Note (Signed)
Prev on prava, stopped same 02/2013 because of myalgia - check annually, last lipids reviewed -  consider alt statin if needed given CAD, but pt is hesitant to resume "at my age"

## 2014-03-10 NOTE — Addendum Note (Signed)
Addended by: Gwendolyn Grant A on: 03/10/2014 01:22 PM   Modules accepted: Orders

## 2014-03-11 ENCOUNTER — Encounter: Payer: Self-pay | Admitting: Internal Medicine

## 2014-03-11 ENCOUNTER — Telehealth: Payer: Self-pay | Admitting: *Deleted

## 2014-03-11 NOTE — Telephone Encounter (Signed)
Pt stated she started having some diarrhea last night. Early morning while going to the bath room she fainted & fell. Husband called EMS they check her out, but she is having some severe spasm in her back. Wanting to see will md send her in muscle relaxant....Johny Chess

## 2014-03-12 MED ORDER — TIZANIDINE HCL 4 MG PO TABS: 4.0000 mg | ORAL_TABLET | Freq: Three times a day (TID) | ORAL | Status: DC | PRN

## 2014-03-12 NOTE — Telephone Encounter (Signed)
Spoke with md since pt called checking status on msg from yesterday ok to send in zanaflex 4 mg take 1 every 8 hours as needed. #30 called pt inform her md will be sending in muscle relaxant to [facility]...Tamara Robertson

## 2014-03-12 NOTE — Telephone Encounter (Signed)
Agree Thanks for your help!

## 2014-03-13 ENCOUNTER — Other Ambulatory Visit: Payer: Self-pay | Admitting: Internal Medicine

## 2014-03-13 ENCOUNTER — Encounter: Payer: Self-pay | Admitting: Family Medicine

## 2014-03-13 ENCOUNTER — Ambulatory Visit (INDEPENDENT_AMBULATORY_CARE_PROVIDER_SITE_OTHER): Payer: Medicare Other | Admitting: Family Medicine

## 2014-03-13 VITALS — BP 160/62 | Temp 98.0°F | Wt 151.0 lb

## 2014-03-13 DIAGNOSIS — M533 Sacrococcygeal disorders, not elsewhere classified: Secondary | ICD-10-CM

## 2014-03-13 DIAGNOSIS — M545 Low back pain: Secondary | ICD-10-CM

## 2014-03-13 MED ORDER — BACLOFEN 10 MG PO TABS: 5.0000 mg | ORAL_TABLET | Freq: Three times a day (TID) | ORAL | Status: DC

## 2014-03-13 NOTE — Progress Notes (Signed)
[facility] [address] [place], [place] [address] Phone: [phone]  03/13/2014  Patient: Tamara Robertson, MRN: [id number], DOB: [birth year], 80 y.o.  Primary Physician:  Rene Paci, MD  Chief Complaint:  Chief Complaint  Patient presents with  . back spasm    pt c/o back spasms, headache and fainting along with diarrhea since Thursday, Tizanidine made her feel strange so she is not taking that muscle relaxer    Subjective:   Tamara Robertson is a 80 y.o. very pleasant female patient who presents with the following:  Very pleasant 80 year old female who became syncopal on Thursday and fell while she was at home with her husband.  They think that she likely struck her back, and since that point she has been having some pain in her back and in her coccyx region. EMS came and assessed.  She called Dr. Felicity Coyer, and the patient got some 4 mg Zanaflex tablets.  She thought that they made her very spaced out and dysphoric, so she discontinued these.  She is having a lot of difficulty sitting down.  They tried using a doughnut, which did not really help, and they have some foam padding, which is helped somewhat.  She has had some difficulty falling asleep.  For pain, she has tried some tramadol, one quarter tablet, and that has seemed to help somewhat.  She does not have any bowel or bladder incontinence.  No genital anesthesia.  She also has some pain and discomfort on the exterior of her throat.   Fell on Thursday morning and called the ambulance. Back spasms - bad.   VAL - got some zanaflex and "mouth open and drooling." F/u with her this week.   Past Medical History, Surgical History, Social History, Family History, Problem List, Medications, and Allergies have been reviewed and updated if relevant.  GEN: No fevers, chills. Nontoxic. Primarily MSK c/o today. MSK: Detailed in the HPI GI: tolerating PO intake without  difficulty Neuro: No numbness, parasthesias, or tingling associated. Otherwise the pertinent positives of the ROS are noted above.    Objective:   BP 160/62 mmHg  Temp(Src) 98 F (36.7 C)  Wt 151 lb (68.493 kg)  GEN: WDWN, NAD, Non-toxic, A & O x 3 HEENT: Atraumatic, Normocephalic. Neck supple. No masses, No LAD. Ears and Nose: No external deformity. EXTR: No c/c/e NEURO Normal gait.  PSYCH: Normally interactive. Conversant. Not depressed or anxious appearing.  Calm demeanor.   Patient has relatively preserved flexion, extension and lateral bending of the lumbar spine.  She does have some mild tenderness and spasm from L2-S1 in the erector spinae complex, but she does not have any spinous process tenderness.  Sensation and strength are preserved.  Neurovascularly intact.  The sacrum is grossly nontender.  The coccyx is markedly tender to palpation.   Laboratory and Imaging Data:  Assessment and Plan:   Coccydynia  Low back pain without sciatica, unspecified back pain laterality  Likely coccyx fracture.  Discussed poor sensitivity and specificity for x-ray for this injury, and I do not think that advanced imaging without much to management.  Anticipate 3-4 weeks of significant pain in this region.  Continue to use foam padding.  Tylenol and tramadol for pain.  One quarter tablet of tramadol.  Unable to tolerate Zanaflex.  Trial of only 5 mg of baclofen when necessary 3 times a day.  New Prescriptions   BACLOFEN (LIORESAL) 10 MG TABLET    Take 0.5  tablets (5 mg total) by mouth 3 (three) times daily.   No orders of the defined types were placed in this encounter.    Signed,  Elpidio Galea. Jaz Laningham, MD   Patient's Medications  New Prescriptions   BACLOFEN (LIORESAL) 10 MG TABLET    Take 0.5 tablets (5 mg total) by mouth 3 (three) times daily.  Previous Medications   ALPRAZOLAM (XANAX) 0.25 MG TABLET    Take 1 tablet (0.25 mg total) by mouth 2 (two) times daily as needed for  anxiety.   BIOTIN 2500 MCG CAPS    Take by mouth daily.   CALCIUM CITRATE (CITRACAL PO)    One capful three times a week   CALCIUM-MAGNESIUM-VITAMIN D (CITRACAL CALCIUM+D) 600-40-500 MG-MG-UNIT TB24    Take 1 tablet by mouth daily.    DORZOLAMIDE-TIMOLOL (COSOPT) 22.3-6.8 MG/ML OPHTHALMIC SOLUTION    Place 1 drop into both eyes 2 (two) times daily.    FLAXSEED MISC    by Does not apply route. Take 1 tsp daily   HYDROCHLOROTHIAZIDE (MICROZIDE) 12.5 MG CAPSULE    TAKE ONE CAPSULE BY MOUTH DAILY AS NEEDED for swelling and edema   MAGNESIUM 250 MG TABS    Take 250 mg by mouth daily.   MULTIPLE VITAMIN (MULTIVITAMIN) TABLET    Take 1 tablet by mouth daily.     NYSTATIN (MYCOSTATIN/NYSTOP) 100000 UNIT/GM POWD    Apply 1 g topically 2 (two) times daily.   PANTOPRAZOLE (PROTONIX) 20 MG TABLET    TAKE ONE TABLET BY MOUTH ONE TIME DAILY    POLYETHYLENE GLYCOL (MIRALAX / GLYCOLAX) PACKET    Take 17 g by mouth every 3 (three) days.   PREDNISONE (DELTASONE) 2.5 MG TABLET    Take 2.5 mg by mouth daily.   PROBIOTIC PRODUCT (PROBIOTIC FORMULA PO)    Take 1 tablet by mouth daily.     SIMETHICONE (PHAZYME) 125 MG CHEWABLE TABLET    Chew 1 tablet (125 mg total) by mouth every 6 (six) hours as needed for flatulence.  Modified Medications   No medications on file  Discontinued Medications   TIZANIDINE (ZANAFLEX) 4 MG TABLET    Take 1 tablet (4 mg total) by mouth every 8 (eight) hours as needed for muscle spasms.

## 2014-03-18 ENCOUNTER — Ambulatory Visit: Payer: Medicare Other | Admitting: Podiatrist

## 2014-03-19 ENCOUNTER — Other Ambulatory Visit (INDEPENDENT_AMBULATORY_CARE_PROVIDER_SITE_OTHER): Payer: Medicare Other

## 2014-03-19 DIAGNOSIS — E785 Hyperlipidemia, unspecified: Secondary | ICD-10-CM

## 2014-03-19 DIAGNOSIS — R739 Hyperglycemia, unspecified: Secondary | ICD-10-CM

## 2014-03-19 LAB — LIPID PANEL
CHOLESTEROL: 220 mg/dL — AB (ref 0–200)
HDL: 38.9 mg/dL — AB (ref >39.00)
NonHDL: 181.1
TRIGLYCERIDES: 205 mg/dL — AB (ref 0.0–149.0)
Total CHOL/HDL Ratio: 6
VLDL: 41 mg/dL — ABNORMAL HIGH (ref 0.0–40.0)

## 2014-03-19 LAB — HEMOGLOBIN A1C: HEMOGLOBIN A1C: 5.8 % (ref 4.6–6.5)

## 2014-03-19 LAB — LDL CHOLESTEROL, DIRECT: Direct LDL: 155.9 mg/dL

## 2014-03-22 ENCOUNTER — Encounter: Payer: Self-pay | Admitting: Internal Medicine

## 2014-03-23 ENCOUNTER — Other Ambulatory Visit: Payer: Medicare Other

## 2014-03-26 ENCOUNTER — Telehealth: Payer: Self-pay | Admitting: Oncology

## 2014-03-26 ENCOUNTER — Other Ambulatory Visit (HOSPITAL_BASED_OUTPATIENT_CLINIC_OR_DEPARTMENT_OTHER): Payer: Medicare Other

## 2014-03-26 ENCOUNTER — Ambulatory Visit (HOSPITAL_BASED_OUTPATIENT_CLINIC_OR_DEPARTMENT_OTHER): Payer: Medicare Other | Admitting: Oncology

## 2014-03-26 VITALS — BP 170/63 | HR 78 | Temp 97.5°F | Resp 18 | Ht 62.0 in | Wt 152.8 lb

## 2014-03-26 DIAGNOSIS — D693 Immune thrombocytopenic purpura: Secondary | ICD-10-CM

## 2014-03-26 LAB — COMPREHENSIVE METABOLIC PANEL (CC13)
ALK PHOS: 112 U/L (ref 40–150)
ALT: 19 U/L (ref 0–55)
AST: 23 U/L (ref 5–34)
Albumin: 3.8 g/dL (ref 3.5–5.0)
Anion Gap: 10 mEq/L (ref 3–11)
BUN: 19 mg/dL (ref 7.0–26.0)
CALCIUM: 9.6 mg/dL (ref 8.4–10.4)
CO2: 28 mEq/L (ref 22–29)
Chloride: 105 mEq/L (ref 98–109)
Creatinine: 1 mg/dL (ref 0.6–1.1)
GLUCOSE: 113 mg/dL (ref 70–140)
Potassium: 4.3 mEq/L (ref 3.5–5.1)
Sodium: 142 mEq/L (ref 136–145)
Total Bilirubin: 0.34 mg/dL (ref 0.20–1.20)
Total Protein: 7.2 g/dL (ref 6.4–8.3)

## 2014-03-26 LAB — CBC WITH DIFFERENTIAL/PLATELET
BASO%: 0.3 % (ref 0.0–2.0)
BASOS ABS: 0 10*3/uL (ref 0.0–0.1)
EOS%: 2.1 % (ref 0.0–7.0)
Eosinophils Absolute: 0.2 10*3/uL (ref 0.0–0.5)
HEMATOCRIT: 39.7 % (ref 34.8–46.6)
HGB: 12.9 g/dL (ref 11.6–15.9)
LYMPH%: 40.2 % (ref 14.0–49.7)
MCH: 28.4 pg (ref 25.1–34.0)
MCHC: 32.5 g/dL (ref 31.5–36.0)
MCV: 87.3 fL (ref 79.5–101.0)
MONO#: 1 10*3/uL — ABNORMAL HIGH (ref 0.1–0.9)
MONO%: 9.4 % (ref 0.0–14.0)
NEUT%: 48 % (ref 38.4–76.8)
NEUTROS ABS: 4.9 10*3/uL (ref 1.5–6.5)
PLATELETS: 169 10*3/uL (ref 145–400)
RBC: 4.55 10*6/uL (ref 3.70–5.45)
RDW: 13.9 % (ref 11.2–14.5)
WBC: 10.1 10*3/uL (ref 3.9–10.3)
lymph#: 4.1 10*3/uL — ABNORMAL HIGH (ref 0.9–3.3)

## 2014-03-26 NOTE — Telephone Encounter (Signed)
Gave avs & cal for May 2016. °

## 2014-03-26 NOTE — Progress Notes (Signed)
Hematology and Oncology Follow Up Visit  Tamara Robertson [id number] 07/16/1929 80 y.o. 03/26/2014 1:34 PM   Principle Diagnosis: 80 year old with chronic, relapsing ITP diagnosed in the 50s.   Prior Therapy: Patient S/P splenectomy and subsequently treated with high doses of steroids. The patient have had a complete response to steroids back in the 60s and all the way have had a few relapses. Every time she has a relapse she gets restarted on high-dose of steroids and she achieved a complete response. The most recent of relapses before her move to [facility] she was hospitalized for a platelet count of 8000 around the year 2000.  Current therapy: Prednisone to 2.5 mg every other day.   Interim History: Tamara Robertson returns today for a follow up visit with her husband. She has been doing well since the last visit. She did have a fall and had a fractured tail bone that is healing well. She is able to ambulate without any difficulty. She is completely asymptomatic at this point. She she is not reporting any bleeding, easy bruisability, or any skin changes. She has not reported any hematochezia or melena she has not reported any epistaxis or hemoptysis. She has not reported any headaches or blurry vision or double vision. Has not reported any chest pain cough or hemoptysis. Has not reported any nausea or vomiting or abdominal pain. Has not reported any frequency urgency or hematuria. Rest or view of systems unremarkable.   Medications: I have reviewed the patient's current medications. Unchanged by my review. Current Outpatient Prescriptions  Medication Sig Dispense Refill  . ALPRAZolam (XANAX) 0.25 MG tablet Take 1 tablet (0.25 mg total) by mouth 2 (two) times daily as needed for anxiety. 20 tablet 0  . baclofen (LIORESAL) 10 MG tablet Take 0.5 tablets (5 mg total) by mouth 3 (three) times daily. 40 each 0  . Biotin 2500 MCG CAPS Take by mouth daily.    . Calcium Citrate (CITRACAL PO) One capful three  times a week    . Calcium-Magnesium-Vitamin D (CITRACAL CALCIUM+D) 600-40-500 MG-MG-UNIT TB24 Take 1 tablet by mouth daily.     . dorzolamide-timolol (COSOPT) 22.3-6.8 MG/ML ophthalmic solution Place 1 drop into both eyes 2 (two) times daily.     . Flaxseed MISC by Does not apply route. Take 1 tsp daily    . hydrochlorothiazide (MICROZIDE) 12.5 MG capsule TAKE ONE CAPSULE BY MOUTH DAILY AS NEEDED for swelling and edema 30 capsule 1  . Magnesium 250 MG TABS Take 250 mg by mouth daily.    . Multiple Vitamin (MULTIVITAMIN) tablet Take 1 tablet by mouth daily.      [name] nystatin (MYCOSTATIN/NYSTOP) 100000 UNIT/GM POWD Apply 1 g topically 2 (two) times daily. 60 g 3  . pantoprazole (PROTONIX) 20 MG tablet TAKE ONE TABLET BY MOUTH ONE TIME DAILY  90 tablet 0  . polyethylene glycol (MIRALAX / GLYCOLAX) packet Take 17 g by mouth every 3 (three) days.    . predniSONE (DELTASONE) 2.5 MG tablet Take 2.5 mg by mouth daily.    . Probiotic Product (PROBIOTIC FORMULA PO) Take 1 tablet by mouth daily.      . simethicone (PHAZYME) 125 MG chewable tablet Chew 1 tablet (125 mg total) by mouth every 6 (six) hours as needed for flatulence. 30 tablet 0   No current facility-administered medications for this visit.    Allergies:  Allergies  Allergen Reactions  . Aspirin Other (See Comments)    ITP  . Contrast Media [  Iodinated Diagnostic Agents]     hives  . Statins     Past Medical History, Surgical history, Social history, and Family History were reviewed and updated.  Physical Exam: Blood pressure 170/63, pulse 78, temperature 97.5 F (36.4 C), temperature source Oral, resp. rate 18, height 5\' 2"  (1.575 m), weight 152 lb 12.8 oz (69.31 kg), SpO2 98 %. ECOG: 1 General appearance: alert awake not in any distress. Head: Normocephalic, without obvious abnormality Neck: no adenopathy, no thyroid masses. Lymph nodes: Cervical, supraclavicular, and axillary nodes normal. Heart:regular rate and rhythm, S1, S2  normal, no murmur, click, rub or gallop Lung:chest clear, no wheezing, rales, normal symmetric air entry Abdomin: soft, non-tender, without masses or organomegaly EXT:no erythema, induration, or nodules Neurologically intact.   Lab Results: Lab Results  Component Value Date   WBC 10.1 03/26/2014   HGB 12.9 03/26/2014   HCT 39.7 03/26/2014   MCV 87.3 03/26/2014   PLT 169 03/26/2014     Chemistry      Component Value Date/Time   NA 142 03/26/2014 1253   NA 142 01/29/2013 0954   K 4.3 03/26/2014 1253   K 4.8 01/29/2013 0954   CL 106 01/29/2013 0954   CL 105 04/17/2012 1452   CO2 28 03/26/2014 1253   CO2 30 01/29/2013 0954   BUN 19.0 03/26/2014 1253   BUN 17 01/29/2013 0954   CREATININE 1.0 03/26/2014 1253   CREATININE 0.8 01/29/2013 0954      Component Value Date/Time   CALCIUM 9.6 03/26/2014 1253   CALCIUM 9.6 01/29/2013 0954   ALKPHOS 112 03/26/2014 1253   ALKPHOS 72 12/19/2012 1154   AST 23 03/26/2014 1253   AST 21 12/19/2012 1154   ALT 19 03/26/2014 1253   ALT 16 12/19/2012 1154   BILITOT 0.34 03/26/2014 1253   BILITOT 0.6 12/19/2012 1154      Impression and Plan:   80 year old female with the following issues:   1. Chronic relapsing ITP: She had multiple relapses in the past that is highly responsive to prednisone. She is currently on low-dose maintenance which seems to be effective in the limiting her relapses. Her platelet count is 169 which is normal at this point and no further intervention is needed. I plan on keeping her at this current prednisone dose without any changes.  If she has a drop below 50 K, I will treat her with high dose steroids with possible IVIG.   2. Followup: 80 In 6 months.         Tamara Hose, MD 11/20/20151:34 PM

## 2014-04-06 ENCOUNTER — Other Ambulatory Visit: Payer: Self-pay | Admitting: Internal Medicine

## 2014-04-06 ENCOUNTER — Ambulatory Visit
Admission: RE | Admit: 2014-04-06 | Discharge: 2014-04-06 | Disposition: A | Discharge status: Home / Self Care | Payer: Medicare Other | Source: Ambulatory Visit | Attending: Internal Medicine | Admitting: Internal Medicine

## 2014-04-06 DIAGNOSIS — R1031 Right lower quadrant pain: Secondary | ICD-10-CM

## 2014-04-06 DIAGNOSIS — R1013 Epigastric pain: Secondary | ICD-10-CM

## 2014-04-06 DIAGNOSIS — K8020 Calculus of gallbladder without cholecystitis without obstruction: Secondary | ICD-10-CM

## 2014-04-23 ENCOUNTER — Ambulatory Visit: Payer: Medicare Other | Admitting: Cardiology

## 2014-04-23 ENCOUNTER — Other Ambulatory Visit: Payer: Self-pay

## 2014-04-23 ENCOUNTER — Encounter: Payer: Self-pay | Admitting: Cardiology

## 2014-04-23 ENCOUNTER — Other Ambulatory Visit: Payer: Self-pay | Admitting: Gynecologic Oncology

## 2014-04-23 DIAGNOSIS — Z1231 Encounter for screening mammogram for malignant neoplasm of breast: Secondary | ICD-10-CM

## 2014-04-23 DIAGNOSIS — N83202 Unspecified ovarian cyst, left side: Secondary | ICD-10-CM

## 2014-04-23 NOTE — Progress Notes (Signed)
Patient informed of date and time for ultrasound.

## 2014-05-02 ENCOUNTER — Other Ambulatory Visit: Payer: Self-pay | Admitting: Internal Medicine

## 2014-05-24 ENCOUNTER — Ambulatory Visit (HOSPITAL_COMMUNITY): Payer: Medicare Other

## 2014-05-26 ENCOUNTER — Ambulatory Visit (HOSPITAL_COMMUNITY)
Admission: RE | Admit: 2014-05-26 | Discharge: 2014-05-26 | Disposition: A | Discharge status: Home / Self Care | Payer: Medicare Other | Source: Ambulatory Visit | Attending: Gynecologic Oncology | Admitting: Gynecologic Oncology

## 2014-05-26 DIAGNOSIS — N8320 Unspecified ovarian cysts: Secondary | ICD-10-CM | POA: Diagnosis not present

## 2014-05-26 DIAGNOSIS — N83202 Unspecified ovarian cyst, left side: Secondary | ICD-10-CM

## 2014-05-26 DIAGNOSIS — Z90710 Acquired absence of both cervix and uterus: Secondary | ICD-10-CM | POA: Diagnosis not present

## 2014-05-26 DIAGNOSIS — N8329 Other ovarian cysts: Secondary | ICD-10-CM | POA: Diagnosis not present

## 2014-06-01 ENCOUNTER — Encounter: Payer: Self-pay | Admitting: Cardiology

## 2014-06-01 ENCOUNTER — Ambulatory Visit (INDEPENDENT_AMBULATORY_CARE_PROVIDER_SITE_OTHER): Payer: Medicare Other | Admitting: Cardiology

## 2014-06-01 VITALS — BP 148/76 | HR 76 | Ht 62.0 in | Wt 152.2 lb

## 2014-06-01 DIAGNOSIS — I10 Essential (primary) hypertension: Secondary | ICD-10-CM | POA: Diagnosis not present

## 2014-06-01 NOTE — Patient Instructions (Signed)
Your physician recommends that you schedule a follow-up appointment in: 6 months with Dr. Hochrein  

## 2014-06-01 NOTE — Progress Notes (Signed)
HPI The patient presents for evaluation of chest discomfort.  Since I last saw her she has done she has had one episode of chest pain but nothing as severe as the pain she had previously. She has had a little ankle swelling has take a diuretic occasionally. She did pass out but this was after having diarrhea and then getting up. She lost consciousness says she broke her tailbone actually. She is otherwise not had any palpitations, presyncope or syncope. She's not had any PND or orthopnea. She's had no weight gain.  Allergies  Allergen Reactions  . Aspirin Other (See Comments)    ITP  . Contrast Media [Iodinated Diagnostic Agents]     hives  . Statins     Current Outpatient Prescriptions  Medication Sig Dispense Refill  . ALPRAZolam (XANAX) 0.25 MG tablet Take 1 tablet (0.25 mg total) by mouth 2 (two) times daily as needed for anxiety. 20 tablet 0  . Biotin 2500 MCG CAPS Take by mouth daily.    . Calcium Citrate (CITRACAL PO) One capful three times a week    . Calcium-Magnesium-Vitamin D (CITRACAL CALCIUM+D) 600-40-500 MG-MG-UNIT TB24 Take 1 tablet by mouth daily.     . dorzolamide-timolol (COSOPT) 22.3-6.8 MG/ML ophthalmic solution Place 1 drop into both eyes 2 (two) times daily.     . Flaxseed MISC by Does not apply route. Take 1 tsp daily    . hydrochlorothiazide (MICROZIDE) 12.5 MG capsule TAKE ONE CAPSULE BY MOUTH DAILY AS NEEDED for swelling and edema 30 capsule 1  . Magnesium 250 MG TABS Take 250 mg by mouth daily.    . Multiple Vitamin (MULTIVITAMIN) tablet Take 1 tablet by mouth daily.      [name] nystatin (MYCOSTATIN/NYSTOP) 100000 UNIT/GM POWD Apply 1 g topically 2 (two) times daily. 60 g 3  . pantoprazole (PROTONIX) 20 MG tablet Take 1 tablet (20 mg total) by mouth daily. 90 tablet 1  . polyethylene glycol (MIRALAX / GLYCOLAX) packet Take 17 g by mouth every 3 (three) days.    . predniSONE (DELTASONE) 2.5 MG tablet Take 2.5 mg by mouth daily.    . predniSONE (DELTASONE) 2.5 MG  tablet Take 1 tablet (2.5 mg total) by mouth daily. 30 tablet 1  . Probiotic Product (PROBIOTIC FORMULA PO) Take 1 tablet by mouth daily.      . simethicone (PHAZYME) 125 MG chewable tablet Chew 1 tablet (125 mg total) by mouth every 6 (six) hours as needed for flatulence. 30 tablet 0   No current facility-administered medications for this visit.    Past Medical History  Diagnosis Date  . OSTEOARTHRITIS, KNEE, RIGHT   . URINARY INCONTINENCE   . Irritable bowel syndrome   . DIVERTICULITIS, HX OF   . OSTEOPENIA   . Immune thrombocytopenic purpura     chronic - baseline 80-100K, on pred  . UNSPECIFIED PERIPHERAL VASCULAR DISEASE   . OVERACTIVE BLADDER   . DYSLIPIDEMIA   . Glaucoma   . GERD   . HOH (hard of hearing)   . CAD (coronary artery disease)     RCA 40% stenosis cath 01/2011  . Left ovarian cyst dx 01/2013 CT    working with gyn, ?malignant - elevated tumor marker OVA1    Past Surgical History  Procedure Laterality Date  . Splenectomy  1954  . Abdominal hysterectomy  1963  . L pop pta  10/2009  . Laparoscopic sigmoid colectomy  10/2005  . Cataract extraction, bilateral  10/2010  . Knee  arthroscopy Right   . Angioplasty    . Cardiac catheterization      ROS:  As stated in the HPI and negative for all other systems.   PHYSICAL EXAM BP 148/76 mmHg  Pulse 76  Ht 5\' 2"  (1.575 m)  Wt 152 lb 3.2 oz (69.037 kg)  BMI 27.83 kg/m2 GENERAL:  Well appearing NECK:  No jugular venous distention, waveform within normal limits, carotid upstroke brisk and symmetric, no bruits, no thyromegaly LUNGS:  Clear to auscultation bilaterally CHEST:  Unremarkable HEART:  PMI not displaced or sustained,S1 and S2 within normal limits, no S3, no S4, no clicks, no rubs, no murmurs ABD:  Flat, positive bowel sounds normal in frequency in pitch, no bruits, no rebound, no guarding, no midline pulsatile mass, no hepatomegaly, no splenomegaly EXT:  2 plus pulses throughout, no edema, no cyanosis no  clubbing   ASSESSMENT AND PLAN  CHEST PAIN - She has had no new symptoms since her stress test and follow normal. No change in therapy is indicated.   DYSLIPIDEMIA -  She does not want to take statins.  No change in therapy is indicated.

## 2014-06-03 ENCOUNTER — Ambulatory Visit: Payer: Medicare Other | Attending: Gynecologic Oncology | Admitting: Gynecologic Oncology

## 2014-06-03 ENCOUNTER — Encounter: Payer: Self-pay | Admitting: Gynecologic Oncology

## 2014-06-03 ENCOUNTER — Ambulatory Visit (HOSPITAL_BASED_OUTPATIENT_CLINIC_OR_DEPARTMENT_OTHER): Payer: Medicare Other

## 2014-06-03 VITALS — BP 159/61 | HR 78 | Temp 97.8°F | Resp 18 | Ht 62.0 in | Wt 150.4 lb

## 2014-06-03 DIAGNOSIS — R978 Other abnormal tumor markers: Secondary | ICD-10-CM | POA: Diagnosis not present

## 2014-06-03 DIAGNOSIS — N83209 Unspecified ovarian cyst, unspecified side: Secondary | ICD-10-CM

## 2014-06-03 DIAGNOSIS — N8320 Unspecified ovarian cysts: Secondary | ICD-10-CM

## 2014-06-03 NOTE — Progress Notes (Signed)
Consult Note: Gyn-Onc  LELER BRION 81 y.o. female  CC:  Chief Complaint  Patient presents with  . Ovarian Cyst    HPI:   Patient is an 81 year old gravida 3 para 2 aborta 1 who noticed a change in her bowels and that she has a narrow bowel movements about every 3 weeks and then her bowels have returned to normal. She was also experiencing some right lower quadrant pain. The patient states she has a history of spastic colon and IBS. However she cannot tolerate bowel prep for colonoscopy she underwent a CT scan of the abdomen and pelvis which revealed:  CT ABDOMEN AND PELVIS WITH CONTRAST   Lung bases are clear.  8 mm hypervascular lesion in the inferior right hepatic lobe (series 2/ image 35), likely reflecting a flash filling hemangioma. Prior splenectomy. Pancreas and adrenal glands are within normal limits. Gallbladder is unremarkable. No intrahepatic or extrahepatic ductal dilatation. Right renal cysts, including a 2.7 cm anterior upper pole cyst (series 5/ image 16) and a 1.5 cm posterior lower pole cyst (series 5/ image 7). Left kidney is within normal limits. No hydronephrosis. No evidence of bowel obstruction. Appendix is not discretely visualized and may be surgically absent. Prior sigmoid resection with anastomosis in the left pelvis (series 2/image 68). Atherosclerotic calcifications of the abdominal aorta and branch vessels.  No abdominopelvic ascites.  No suspicious abdominopelvic lymphadenopathy.  Status post hysterectomy. 2.8 cm left ovarian cyst (series 2/ image 61). No right adnexal mass. Mild prominence of the mid right ureter (series 2/image 53), although smoothly tapering distally, favored to reflect physiologic laxity.  Bladder is within normal limits. Degenerative changes of the visualized thoracolumbar spine.  IMPRESSION:  No evidence of bowel obstruction.  Prior splenectomy, sigmoidectomy, and hysterectomy. Suspected prior appendectomy. No CT findings to account for  the patient's right lower quadrant abdominal pain. 8 mm hypervascular lesion in the inferior right hepatic lobe, likely reflecting a flash filling hemangioma. 2.8 cm left ovarian cyst. Given postmenopausal status, follow-up pelvic ultrasound is suggested in 1 year.  The CT findings prompted an ultrasound and Dr. Assunta Curtis office. This was performed on October 14. 2 small cysts were noted within the region of the left adnexa. There was a 2.9 x 2.7 x 2.0 cm cyst with soft tissue nodule measuring 1.3 x 0.6 x 1.1 cm within the cyst. There's a separate small cysts measuring 1.8 x 1.8 x 1.5 cm simple in appearance. There is no increased vascularity noted. A CA 125 was performed that was normal at 23. However, an OVA-1 test was performed that was slightly elevated at 4.7 with the upper limit of normal for a menopausal woman being 4.4.  Ultrasound 05/25/13: Left ovary  Measurements: 3.5 x 2.4 x 3.3 cm. Two cyst within the left ovary measuring 2.2 x 1.4 x 2.0 cm and 2.3 x 2.3 x 2.5 cm. These either represent 2 adjacent simple cyst with the measurements as above or 1  larger cyst measuring up to 3.5 cm with a thin internal septation. Other findings No free fluid.  IMPRESSION:  Either two adjacent simple cysts or one larger cyst with a thin internal septation as described above. This is almost certainly benign, but follow up ultrasound is recommended in 1 year according to the Society of Radiologists in [place] Statement (D Clovis Riley et al. Management of Asymptomatic Ovarian and Other Adnexal Cysts Imaged at [place]: Society of Radiologists in [place] Statement 2010. Radiology 256 (Sept 2010): 943-954.).  A CA 125 was also drawn that was normal at 13.8. This is down from 21 on 02/17/2013. She comes in today for followup. She is very pleased with the results of the ultrasound as well as the blood work. She's been completely asymptomatic and has no complaints related to  this.  Interval History: FINDINGS: Uterus Measurements: Previous hysterectomy. No mass identified.  Endometrium  Thickness: Not applicable.  Right ovary  Measurements: Previous right oophorectomy.  Left ovary  Measurements: 4.8 x 2.6 x 3.8 cm. Two cysts are identified. The largest measures 2.5 x 2.2 x 2.7 cm. The smaller cyst measures 1.7 x 1.7 x 1.5 cm. No complicating features are associated with these cysts which appears stable from previous exam.  Other findings  No free fluid.  IMPRESSION: 1. Stable appearance of left ovary cysts. This is almost certainly benign, but follow up ultrasound is recommended in 1 year.  Review of Systems: Constitutional: Denies fever. Skin: No rash, sores, jaundice, itching, or dryness.  Cardiovascular: No chest pain, shortness of breath, slight edema in her left ankle Pulmonary: No cough or wheeze.  Gastro Intestinal:  No nausea, vomiting, constipation, or diarrhea reported. No bright red blood per rectum or change in bowel movement other than as above. She has occasional epigastric discomfort and is seeing Dr. Hilarie Fredrickson in gastroenterology soon. She believes that she'll need an upper endoscopy. She's also having some issues with constipation and feels that she is not emptying her bowels well. She is drinking water, using Citrucel and marrow last without benefit. She does have some early satiety and some bloating with increased gas. She believes much of this is secondary to the bowel issue she is having. Genitourinary: No frequency, urgency, or dysuria.  Denies vaginal bleeding and discharge.  Musculoskeletal: No myalgia, arthralgia, joint swelling or pain other than as above  Neurologic: No weakness, numbness, or change in gait.  Psychology: No changes   Current Meds:  Outpatient Encounter Prescriptions as of 06/03/2014  Medication Sig  . ALPRAZolam (XANAX) 0.25 MG tablet Take 1 tablet (0.25 mg total) by mouth 2 (two) times daily as  needed for anxiety.  . Biotin 2500 MCG CAPS Take by mouth daily.  . Calcium-Magnesium-Vitamin D (CITRACAL CALCIUM+D) 600-40-500 MG-MG-UNIT TB24 Take 1 tablet by mouth daily.   . dorzolamide-timolol (COSOPT) 22.3-6.8 MG/ML ophthalmic solution Place 1 drop into both eyes 2 (two) times daily.   . Flaxseed MISC by Does not apply route. Take 1 tsp daily  . hydrochlorothiazide (MICROZIDE) 12.5 MG capsule TAKE ONE CAPSULE BY MOUTH DAILY AS NEEDED for swelling and edema  . Magnesium 250 MG TABS Take 250 mg by mouth daily.  . Multiple Vitamin (MULTIVITAMIN) tablet Take 1 tablet by mouth daily.    [name] nystatin (MYCOSTATIN/NYSTOP) 100000 UNIT/GM POWD Apply 1 g topically 2 (two) times daily.  . pantoprazole (PROTONIX) 20 MG tablet Take 1 tablet (20 mg total) by mouth daily.  . polyethylene glycol (MIRALAX / GLYCOLAX) packet Take 17 g by mouth every 3 (three) days.  . predniSONE (DELTASONE) 2.5 MG tablet Take 1 tablet (2.5 mg total) by mouth daily.  . Probiotic Product (PROBIOTIC FORMULA PO) Take 1 tablet by mouth daily.    . simethicone (PHAZYME) 125 MG chewable tablet Chew 1 tablet (125 mg total) by mouth every 6 (six) hours as needed for flatulence.  . Calcium Citrate (CITRACAL PO) One capful three times a week  . predniSONE (DELTASONE) 2.5 MG tablet Take 2.5 mg by mouth daily.  Allergy:  Allergies  Allergen Reactions  . Aspirin Other (See Comments)    ITP  . Contrast Media [Iodinated Diagnostic Agents]     hives  . Statins     Social Hx:   History   Social History  . Marital Status: Married    Spouse Name: N/A    Number of Children: N/A  . Years of Education: N/A   Occupational History  . Retired    Social History Main Topics  . Smoking status: Never Smoker   . Smokeless tobacco: Never Used     Comment: Married, lives with spouse. retired Control and instrumentation engineer. Dorie Rank to [place] from [place] 05/2010 to be close to kids  . Alcohol Use: Yes     Comment: rarely  . Drug Use: No  .  Sexual Activity: Not on file   Other Topics Concern  . Not on file   Social History Narrative    Past Surgical Hx:  Past Surgical History  Procedure Laterality Date  . Splenectomy  1954  . Abdominal hysterectomy  1963  . L pop pta  10/2009  . Laparoscopic sigmoid colectomy  10/2005  . Cataract extraction, bilateral  10/2010  . Knee arthroscopy Right   . Angioplasty    . Cardiac catheterization      Past Medical Hx:  Past Medical History  Diagnosis Date  . OSTEOARTHRITIS, KNEE, RIGHT   . URINARY INCONTINENCE   . Irritable bowel syndrome   . DIVERTICULITIS, HX OF   . OSTEOPENIA   . Immune thrombocytopenic purpura     chronic - baseline 80-100K, on pred  . UNSPECIFIED PERIPHERAL VASCULAR DISEASE   . OVERACTIVE BLADDER   . DYSLIPIDEMIA   . Glaucoma   . GERD   . HOH (hard of hearing)   . CAD (coronary artery disease)     RCA 40% stenosis cath 01/2011  . Left ovarian cyst dx 01/2013 CT    working with gyn, ?malignant - elevated tumor marker OVA1    Oncology Hx:   No history exists.    Family Hx:  Family History  Problem Relation Age of Onset  . Coronary artery disease Mother   . Heart attack Mother 55  . Hyperlipidemia Mother   . Hypertension Mother   . Stomach cancer Father   . Hypertension Daughter   . Hyperlipidemia Daughter   . Arthritis      parent  . Transient ischemic attack      parent  . Colon cancer Neg Hx     Vitals:  Blood pressure 159/61, pulse 78, temperature 97.8 F (36.6 C), temperature source Oral, resp. rate 18, height 5\' 2"  (1.575 m), weight 150 lb 6.4 oz (68.221 kg), SpO2 99 %.  Physical Exam: Well-nourished well-developed female in no acute distress.  Abdomen: Well-healed vertical midline incision. Well-healed left upper quadrant incision. Abdomen is soft, nontender, nondistended. There are no palpable masses. There is no hepatosplenomegaly. There is no fluid wave.  Groins: No lymphadenopathy.  Extremities: No edema.  Pelvic:  Normal external female genitalia. Bimanual examination reveals no masses or nodularity.    Assessment/Plan: 81 year old gravida 3 para 2 status post hysterectomy and right salpingo-oophorectomy at the age of 61 or 47 4 advanced endometriosis. She currently has a complex left adnexal mass measuring less than 3 cm. CA 125 was normal at 13, however, OVA-1 was slightly elevated at 4.7 with the normal reference range of less than 4.4 and a postmenopausal woman.  At this point as  it is not appear that she's been a dramatic increase in the size of the cyst and her CA 125 has decreased.  Due to the stability I think that repeat imaging in one year is reasonable.  However, she may or may not wish to continue follow up as things have been stable for > 1year. I think that this is reasonable.  She is to contact us if she experiences any symptoms. These were again reviewed and include bloating, pelvic pain or discomfort, change about bladder habits, or early satiety. Her questions  were elicited and answered to her satisfaction. We will notify her of the results from her CA-125 today. If increasing or elevated, she knows that our recommendations will change.  Dalayna Lauter A., MD 06/03/2014, 12:21 PM

## 2014-06-03 NOTE — Patient Instructions (Signed)
We will notify you of the results of your bloodwork from today. Please return to see [place] in one year. If you choose not to follow-up in 1 year, please make sure to call us if you have any increasing and bloating, pelvic pressure or discomfort.

## 2014-06-04 ENCOUNTER — Other Ambulatory Visit: Payer: Self-pay | Admitting: Gynecologic Oncology

## 2014-06-04 LAB — CA 125(PREVIOUS METHOD): CA 125: 12.6 U/mL (ref 0.0–30.2)

## 2014-06-04 LAB — CA 125: CA 125: 19 U/mL (ref <35)

## 2014-06-08 ENCOUNTER — Telehealth: Payer: Self-pay | Admitting: Gynecologic Oncology

## 2014-06-08 NOTE — Telephone Encounter (Signed)
Pt notified of Ca 125 results.  No concerns voiced.  Advised to call for any questions or concerns.

## 2014-06-10 ENCOUNTER — Ambulatory Visit
Admission: RE | Admit: 2014-06-10 | Discharge: 2014-06-10 | Disposition: A | Discharge status: Home / Self Care | Payer: Medicare Other | Source: Ambulatory Visit

## 2014-06-10 DIAGNOSIS — Z1231 Encounter for screening mammogram for malignant neoplasm of breast: Secondary | ICD-10-CM

## 2014-06-15 ENCOUNTER — Encounter: Payer: Self-pay | Admitting: Internal Medicine

## 2014-06-16 ENCOUNTER — Encounter: Payer: Self-pay | Admitting: Internal Medicine

## 2014-06-16 NOTE — Telephone Encounter (Signed)
Pt called  And stated that she sent md mychart msg, but hasn't received a response back. Wanting to now who can she see concerning stomach pain. Was told by Dr. Johney Maine it was her gallbladder. She has an appt with GI but not until March. Pt states the pain is severe sometimes. Advise pt if pain is severe she need to go to ER, but can get her in tomorrow am with Dr. Linna Darner. She stated she is watching what she is eating. Pain not to bad this am. once again advise if sxs increase to go to ER...Johny Chess

## 2014-06-17 ENCOUNTER — Encounter: Payer: Self-pay | Admitting: Internal Medicine

## 2014-06-17 ENCOUNTER — Ambulatory Visit (INDEPENDENT_AMBULATORY_CARE_PROVIDER_SITE_OTHER): Payer: Medicare Other | Admitting: Internal Medicine

## 2014-06-17 VITALS — BP 146/70 | HR 80 | Temp 97.6°F | Ht 62.0 in | Wt 147.1 lb

## 2014-06-17 DIAGNOSIS — E785 Hyperlipidemia, unspecified: Secondary | ICD-10-CM

## 2014-06-17 DIAGNOSIS — R1011 Right upper quadrant pain: Secondary | ICD-10-CM | POA: Diagnosis not present

## 2014-06-17 NOTE — Telephone Encounter (Signed)
Please get her an appt with any [facility] provider who has acute - also call GI to ask them to work her in as acute with one of their NPs/PAs for Dr Hilarie Fredrickson  Thanks!

## 2014-06-17 NOTE — Progress Notes (Signed)
Pre visit review using our clinic review tool, if applicable. No additional management support is needed unless otherwise documented below in the visit note. 

## 2014-06-17 NOTE — Telephone Encounter (Signed)
Pt was scheduled (via [facility]) to be seen today by Linna Darner.  GI has her an appt with [facility] (sp?) 06/18/2014 at 10am.

## 2014-06-17 NOTE — Progress Notes (Signed)
Subjective:    Patient ID: Tamara Robertson, female    DOB: 1929/07/30, 81 y.o.   MRN: [id number]  HPI She has been having right upper quadrant pain which radiates to the mid chest and to the right scapular area. 2 most recent episodes were 2/5 and 2/8. The first started 2 PM and lasted through the next morning. She a fried chicken dish that night. The second occurred at 1:30 PM and again lasted dental next morning it was associated with pizza. Isotopically has been of minimal benefit. She also took a half a Xanax which did help.  These episodes were associated with dyspepsia and nausea.  Her cardiac workup included a stress myocardial cardio perfusion study 01/29/14 which was negative. She does have significant dyslipidemia with LDL of 155.9; she's declined a statin. She has had some leg discomfort with statins in the past.  Ultrasound as documented cholelithiasis and fatty liver. The surgeon did not feel that she required surgery.  She's had a partial bowel resection for diverticulitis.  She does not smoke. She rarely drinks alcohol.  Father had stomach cancer. Mother had myocardial infarction.   Review of Systems She denies constitutional symptoms of fever, chills, sweats or change in weight.   She denies palpitations or claudication.  She's had some darkening of her urine but not Coke-colored. There is no clay colored stool     Objective:   Physical Exam     Pertinent or positive findings include:  She has dense arcus senilis bilaterally.  There is accentuation of the midthoracic curvature.  She has mixed DIP/PIP arthritic changes in the hands.  Abdomen is protuberant without organomegaly, masses, or tenderness.       Assessment & Plan:  #1 right upper quadrant pain which is historically classic for gallbladder disease. She has seen a surgeon who has not recommend surgery.  #2 dyslipidemia with family history heart attack in her mother at the same age.  Plan: She  will follow-up with the gastroenterologist for a second opinion concerning her symptoms.  Advanced cholesterol testing will be performed to optimally assess cardiovascular risk

## 2014-06-17 NOTE — Patient Instructions (Addendum)
  Your next office appointment will be determined based upon review of your pending labs . Those instructions will be transmitted to you through My Chart OR  by mail;whichever process is your choice to receive results & recommendations . Critical values will be called. Followup as needed for any active or acute issue. Please report any significant change in your symptoms.

## 2014-06-18 ENCOUNTER — Other Ambulatory Visit (INDEPENDENT_AMBULATORY_CARE_PROVIDER_SITE_OTHER): Payer: Medicare Other

## 2014-06-18 ENCOUNTER — Ambulatory Visit (INDEPENDENT_AMBULATORY_CARE_PROVIDER_SITE_OTHER): Payer: Medicare Other | Admitting: Nurse Practitioner

## 2014-06-18 ENCOUNTER — Encounter: Payer: Self-pay | Admitting: Nurse Practitioner

## 2014-06-18 VITALS — BP 150/80 | HR 80 | Ht 62.0 in | Wt 149.0 lb

## 2014-06-18 DIAGNOSIS — K8020 Calculus of gallbladder without cholecystitis without obstruction: Secondary | ICD-10-CM

## 2014-06-18 DIAGNOSIS — R1010 Upper abdominal pain, unspecified: Secondary | ICD-10-CM

## 2014-06-18 DIAGNOSIS — R1013 Epigastric pain: Secondary | ICD-10-CM | POA: Diagnosis not present

## 2014-06-18 LAB — HEPATIC FUNCTION PANEL
ALT: 102 U/L — AB (ref 0–35)
AST: 38 U/L — ABNORMAL HIGH (ref 0–37)
Albumin: 4.1 g/dL (ref 3.5–5.2)
Alkaline Phosphatase: 150 U/L — ABNORMAL HIGH (ref 39–117)
Bilirubin, Direct: 0.2 mg/dL (ref 0.0–0.3)
Total Bilirubin: 0.5 mg/dL (ref 0.2–1.2)
Total Protein: 8 g/dL (ref 6.0–8.3)

## 2014-06-18 NOTE — Progress Notes (Signed)
Subjective:    Patient ID: Tamara Robertson, female    DOB: October 31, 1929, 81 y.o.   MRN: [id number]  HPI Typos in first paragraph: "dental" should be "until" "Isotopically" should be "ice topically"    Review of Systems     Objective:   Physical Exam  Normal or negative findings: General appearance :adequately nourished; in no distress. Eyes: No conjunctival inflammation or scleral icterus is present. Oral exam: Dental hygiene is good. Lips and gums are healthy appearing.There is no oropharyngeal erythema or exudate noted.  Heart:  Normal rate and regular rhythm. S1 and S2 normal without gallop, murmur, click, rub or other extra sounds   Lungs:Chest clear to auscultation; no wheezes, rhonchi,rales ,or rubs present.No increased work of breathing.  Abdomen: bowel sounds normal, soft without organomegaly or hernias .  No guarding or rebound. Vascular : all pulses equal ; no bruits present. Skin:Warm & dry.  Intact without suspicious lesions or rashes ; no jaundice or tenting Lymphatic: No lymphadenopathy is noted about the head, neck, axilla Neuro: Strength, tone & DTRs normal.        Assessment & Plan:

## 2014-06-18 NOTE — Progress Notes (Signed)
Subjective:    Patient ID: Tamara Robertson, female    DOB: Dec 31, 1929, 81 y.o.   MRN: [id number]  HPI    Review of Systems     Objective:   Physical Exam        Assessment & Plan:

## 2014-06-18 NOTE — Patient Instructions (Signed)
Your physician has requested that you go to the basement for the following lab work before leaving today: Hepatic panel  You have been scheduled for an endoscopy. Please follow written instructions given to you at your visit today. If you use inhalers (even only as needed), please bring them with you on the day of your procedure. Your physician has requested that you go to www.startemmi.com and enter the access code given to you at your visit today. This web site gives a general overview about your procedure. However, you should still follow specific instructions given to you by our office regarding your preparation for the procedure.   OI:[id number] [employer]

## 2014-06-20 ENCOUNTER — Encounter: Payer: Self-pay | Admitting: Nurse Practitioner

## 2014-06-21 ENCOUNTER — Other Ambulatory Visit: Payer: Self-pay

## 2014-06-21 ENCOUNTER — Telehealth: Payer: Self-pay | Admitting: *Deleted

## 2014-06-21 DIAGNOSIS — R945 Abnormal results of liver function studies: Principal | ICD-10-CM

## 2014-06-21 DIAGNOSIS — K8020 Calculus of gallbladder without cholecystitis without obstruction: Secondary | ICD-10-CM | POA: Insufficient documentation

## 2014-06-21 DIAGNOSIS — R7989 Other specified abnormal findings of blood chemistry: Secondary | ICD-10-CM

## 2014-06-21 NOTE — Telephone Encounter (Signed)
Patient called Tamara Robertson, receptionist in [place] this morning to tell her that she has been having dizziness for the last 3-4 days which she describes as "vertigo." When asked if she has ever been diagnosed with vertigo, she tells me she has not. She states that she has been wearing seabands to help with the nausea associated with dizziness. The dizziness occurs with changes in position. I have suggested that she contact her PCP and get an ASAP appointment for evaluation of this prior to [place] giving her sedation for any procedure. She verbalizes understanding. I have cancelled her EGD for now and she states that she will call me back once evaluated so we can reschedule her.

## 2014-06-21 NOTE — Progress Notes (Addendum)
     History of Present Illness:   Patient is an 81 year old female known to Dr. Hilarie Fredrickson. She has a history of longstanding IBS, diverticulitis requiring sigmoidectomy, and GERD.   She is referred by PCP for evaluation of RUQ pain. Ultrasound in December positive for cholelithiasis. Patient was evaluated by surgery who apparently didn't feel cholecystectomy warranted.   Patient two recent "attacks" of epigastric pain radiating up into chest and through to her back. Pain is squeezing in nature and associated with nausea. Episodes can last hours. She feels fine in between episodes. No unusual weight loss. Patient on daily PPI. No NSAID use.  Current Medications, Allergies, Past Medical History, Past Surgical History, Family History and Social History were reviewed in [employer] record.  Physical Exam: General: Pleasant, well developed , female in no acute distress Head: Normocephalic and atraumatic Eyes:  sclerae anicteric, conjunctiva pink  Ears: Normal auditory acuity Lungs: Clear throughout to auscultation Heart: Regular rate and rhythm Abdomen: Soft, non distended, mild epigastric tenderness. No masses, no hepatomegaly. Normal bowel sounds Musculoskeletal: Symmetrical with no gross deformities  Extremities: No edema  Neurological: Alert oriented x 4, grossly nonfocal Psychological:  Alert and cooperative. Normal mood and affect  Assessment and Recommendations:  81 year old female with intermittent "attacks" of epigastric pain radiating upward into chest as well as through to back with associated nausea. Ultrasound reveals cholelithiasis but no evidence for cholecystitis. CBD normal caliber. Etiology of pain unclear. It does sound biliary but patient seen by surgery and no plans for cholecystectomy. Will check labs today and arrange for EGD for further evaluation of pain. The benefits, risks, and potential complications of EGD with possible biopsies were discussed  with the patient and she agrees to proceed.    Addendum: Reviewed and agree with initial management. Given elevated LFTs would proceed to MRCP to determine if ERCP is necessary for CBD stones Likely need to revisit the surgical question regarding cholecystectomy Jerene Bears, MD

## 2014-06-21 NOTE — Telephone Encounter (Signed)
Agree completely with PCP eval for dizziness In addition to proceeding to MRCP ASAP for elevated LFTs in the setting of gallstones She should call PCP, [place] immediately if any fever, chills

## 2014-06-22 ENCOUNTER — Other Ambulatory Visit: Payer: Self-pay | Admitting: Internal Medicine

## 2014-06-22 ENCOUNTER — Encounter: Payer: Self-pay | Admitting: Internal Medicine

## 2014-06-22 ENCOUNTER — Other Ambulatory Visit (INDEPENDENT_AMBULATORY_CARE_PROVIDER_SITE_OTHER): Payer: Medicare Other

## 2014-06-22 ENCOUNTER — Ambulatory Visit (INDEPENDENT_AMBULATORY_CARE_PROVIDER_SITE_OTHER): Payer: Medicare Other | Admitting: Internal Medicine

## 2014-06-22 ENCOUNTER — Other Ambulatory Visit: Payer: Self-pay

## 2014-06-22 ENCOUNTER — Encounter: Payer: Medicare Other | Admitting: Internal Medicine

## 2014-06-22 VITALS — BP 150/88 | HR 73 | Temp 97.4°F | Ht 62.0 in | Wt 149.2 lb

## 2014-06-22 DIAGNOSIS — R7989 Other specified abnormal findings of blood chemistry: Secondary | ICD-10-CM

## 2014-06-22 DIAGNOSIS — R42 Dizziness and giddiness: Secondary | ICD-10-CM | POA: Diagnosis not present

## 2014-06-22 DIAGNOSIS — E785 Hyperlipidemia, unspecified: Secondary | ICD-10-CM

## 2014-06-22 DIAGNOSIS — R945 Abnormal results of liver function studies: Principal | ICD-10-CM

## 2014-06-22 DIAGNOSIS — K8020 Calculus of gallbladder without cholecystitis without obstruction: Secondary | ICD-10-CM | POA: Diagnosis not present

## 2014-06-22 LAB — CK: Total CK: 56 U/L (ref 7–177)

## 2014-06-22 LAB — HEPATIC FUNCTION PANEL
ALT: 38 U/L — ABNORMAL HIGH (ref 0–35)
AST: 21 U/L (ref 0–37)
Albumin: 3.9 g/dL (ref 3.5–5.2)
Alkaline Phosphatase: 114 U/L (ref 39–117)
BILIRUBIN DIRECT: 0.1 mg/dL (ref 0.0–0.3)
BILIRUBIN TOTAL: 0.5 mg/dL (ref 0.2–1.2)
Total Protein: 7.3 g/dL (ref 6.0–8.3)

## 2014-06-22 NOTE — Assessment & Plan Note (Signed)
For MRCP 2/25 - ?ERCP or refer back to gen surg Recent eval and current GI plans reviewed Dietary advice given

## 2014-06-22 NOTE — Patient Instructions (Addendum)
It was good to see you today.  We have reviewed your prior records including labs and tests today  Medications reviewed and updated, no changes recommended at this time. Refill on medication(s) as discussed today.  Continue working with your specialists as reviewed and ongoing. Please call if questions or concerns  Keep hydrated and maintain low-fat diet, small frequent meals  Biliary Colic  Biliary colic is a steady or irregular pain in the upper abdomen. It is usually under the right side of the rib cage. It happens when gallstones interfere with the normal flow of bile from the gallbladder. Bile is a liquid that helps to digest fats. Bile is made in the liver and stored in the gallbladder. When you eat a meal, bile passes from the gallbladder through the cystic duct and the common bile duct into the small intestine. There, it mixes with partially digested food. If a gallstone blocks either of these ducts, the normal flow of bile is blocked. The muscle cells in the bile duct contract forcefully to try to move the stone. This causes the pain of biliary colic.  SYMPTOMS   A person with biliary colic usually complains of pain in the upper abdomen. This pain can be:  In the center of the upper abdomen just below the breastbone.  In the upper-right part of the abdomen, near the gallbladder and liver.  Spread back toward the right shoulder blade.  Nausea and vomiting.  The pain usually occurs after eating.  Biliary colic is usually triggered by the digestive system's demand for bile. The demand for bile is high after fatty meals. Symptoms can also occur when a person who has been fasting suddenly eats a very large meal. Most episodes of biliary colic pass after 1 to 5 hours. After the most intense pain passes, your abdomen may continue to ache mildly for about 24 hours. DIAGNOSIS  After you describe your symptoms, your caregiver will perform a physical exam. He or she will pay attention to  the upper right portion of your belly (abdomen). This is the area of your liver and gallbladder. An ultrasound will help your caregiver look for gallstones. Specialized scans of the gallbladder may also be done. Blood tests may be done, especially if you have fever or if your pain persists. PREVENTION  Biliary colic can be prevented by controlling the risk factors for gallstones. Some of these risk factors, such as heredity, increasing age, and pregnancy are a normal part of life. Obesity and a high-fat diet are risk factors you can change through a healthy lifestyle. Women going through menopause who take hormone replacement therapy (estrogen) are also more likely to develop biliary colic. TREATMENT   Pain medication may be prescribed.  You may be encouraged to eat a fat-free diet.  If the first episode of biliary colic is severe, or episodes of colic keep retuning, surgery to remove the gallbladder (cholecystectomy) is usually recommended. This procedure can be done through small incisions using an instrument called a laparoscope. The procedure often requires a brief stay in the hospital. Some people can leave the hospital the same day. It is the most widely used treatment in people troubled by painful gallstones. It is effective and safe, with no complications in more than 90% of cases.  If surgery cannot be done, medication that dissolves gallstones may be used. This medication is expensive and can take months or years to work. Only small stones will dissolve.  Rarely, medication to dissolve gallstones is combined  with a procedure called shock-wave lithotripsy. This procedure uses carefully aimed shock waves to break up gallstones. In many people treated with this procedure, gallstones form again within a few years. PROGNOSIS  If gallstones block your cystic duct or common bile duct, you are at risk for repeated episodes of biliary colic. There is also a 25% chance that you will develop a  gallbladder infection(acute cholecystitis), or some other complication of gallstones within 10 to 20 years. If you have surgery, schedule it at a time that is convenient for you and at a time when you are not sick. HOME CARE INSTRUCTIONS   Drink plenty of clear fluids.  Avoid fatty, greasy or fried foods, or any foods that make your pain worse.  Take medications as directed. SEEK MEDICAL CARE IF:   You develop a fever over 100.5 F (38.1 C).  Your pain gets worse over time.  You develop nausea that prevents you from eating and drinking.  You develop vomiting. SEEK IMMEDIATE MEDICAL CARE IF:   You have continuous or severe belly (abdominal) pain which is not relieved with medications.  You develop nausea and vomiting which is not relieved with medications.  You have symptoms of biliary colic and you suddenly develop a fever and shaking chills. This may signal cholecystitis. Call your caregiver immediately.  You develop a yellow color to your skin or the white part of your eyes (jaundice). Document Released: 09/24/2005 Document Revised: 07/16/2011 Document Reviewed: 12/04/2007 [facility] Patient Information 2015 [place], [place]. This information is not intended to replace advice given to you by your health care provider. Make sure you discuss any questions you have with your health care provider.

## 2014-06-22 NOTE — Telephone Encounter (Signed)
Patient being seen today by Dr Asa Lente for dizziness. MRCP is scheduled for next week. Will await PCP evaluation before rescheduling EGD.

## 2014-06-22 NOTE — Progress Notes (Signed)
Pre visit review using our clinic review tool, if applicable. No additional management support is needed unless otherwise documented below in the visit note. 

## 2014-06-22 NOTE — Progress Notes (Signed)
Subjective:    Patient ID: Tamara Robertson, female    DOB: 1930-05-06, 81 y.o.   MRN: [id number]  HPI  Patient here for followup -  reports dizziness resolved in past 24 hours (reason for appointment reviewed)  also reviewed interval medical events  Past Medical History  Diagnosis Date  . OSTEOARTHRITIS, KNEE, RIGHT   . URINARY INCONTINENCE   . Irritable bowel syndrome   . DIVERTICULITIS, HX OF   . OSTEOPENIA   . Immune thrombocytopenic purpura     chronic - baseline 80-100K, on pred  . UNSPECIFIED PERIPHERAL VASCULAR DISEASE   . OVERACTIVE BLADDER   . DYSLIPIDEMIA   . Glaucoma   . GERD   . HOH (hard of hearing)   . CAD (coronary artery disease)     RCA 40% stenosis cath 01/2011  . Left ovarian cyst dx 01/2013 CT    working with gyn, ?malignant - elevated tumor marker OVA1    Review of Systems  Constitutional: Negative for fever, fatigue and unexpected weight change.  Respiratory: Positive for chest tightness (w/ bilary colic attacks, none now). Negative for cough and shortness of breath.   Cardiovascular: Negative for chest pain, palpitations and leg swelling.  Gastrointestinal: Positive for abdominal pain (intermittent URQ pain spasms related to colic - w/u ongoing) and constipation (chronic). Negative for diarrhea.  Neurological: Negative for dizziness (resolved in past 24h, generally better each day since onset 3 days ago - resolved with electrolyte solution hydration yesterday).       Objective:    Physical Exam  Constitutional: She is oriented to person, place, and time. She appears well-developed and well-nourished. No distress.  Spouse at side  HENT:  Right Ear: External ear normal.  Left Ear: External ear normal.  Nose: Nose normal.  Eyes: Conjunctivae and EOM are normal. Pupils are equal, round, and reactive to light.  No nystagmus  Cardiovascular: Normal rate, regular rhythm and normal heart sounds.   No murmur heard. Pulmonary/Chest: Effort normal and  breath sounds normal. No respiratory distress.  Abdominal: Soft. Bowel sounds are normal. There is no tenderness. There is no rebound and no guarding.  Musculoskeletal: She exhibits no edema.  Neurological: She is alert and oriented to person, place, and time. She has normal reflexes. No cranial nerve deficit. Coordination normal.  Skin: No rash noted. No pallor.    BP 150/88 mmHg  Pulse 73  Temp(Src) 97.4 F (36.3 C) (Oral)  Ht 5\' 2"  (1.575 m)  Wt 149 lb 4 oz (67.699 kg)  BMI 27.29 kg/m2  SpO2 98% Wt Readings from Last 3 Encounters:  06/22/14 149 lb 4 oz (67.699 kg)  06/18/14 149 lb (67.586 kg)  06/17/14 147 lb 0.8 oz (66.701 kg)     Lab Results  Component Value Date   WBC 10.1 03/26/2014   HGB 12.9 03/26/2014   HCT 39.7 03/26/2014   PLT 169 03/26/2014   GLUCOSE 113 03/26/2014   CHOL 220* 03/19/2014   TRIG 205.0* 03/19/2014   HDL 38.90* 03/19/2014   LDLDIRECT 155.9 03/19/2014   LDLCALC 104* 09/05/2012   ALT 102* 06/18/2014   AST 38* 06/18/2014   NA 142 03/26/2014   K 4.3 03/26/2014   CL 106 01/29/2013   CREATININE 1.0 03/26/2014   BUN 19.0 03/26/2014   CO2 28 03/26/2014   TSH 2.95 06/03/2012   INR 1.1* 02/16/2011   HGBA1C 5.8 03/19/2014    Mm Digital Screening Bilateral  06/10/2014   CLINICAL DATA:  Screening.  EXAM: DIGITAL SCREENING BILATERAL MAMMOGRAM WITH CAD  COMPARISON:  Previous exam(s).  ACR Breast Density Category b: There are scattered areas of fibroglandular density.  FINDINGS: There are no findings suspicious for malignancy. Images were processed with CAD.  IMPRESSION: No mammographic evidence of malignancy. A result letter of this screening mammogram will be mailed directly to the patient.  RECOMMENDATION: Screening mammogram in one year. (Code:[id number]-B-01Y)  BI-RADS CATEGORY  1: Negative.   Electronically Signed   By: Rosalie Gums M.D.   On: 06/10/2014 15:28       Assessment & Plan:   Problem List Items Addressed This Visit    Calculus of gallbladder  without cholecystitis without obstruction    For MRCP 2/25 - ?ERCP or refer back to gen surg Recent eval and current GI plans reviewed Dietary advice given       Other Visit Diagnoses    Dizziness, nonspecific    -  Primary      Dizziness 72 hours, resolved in past 24 with electrolyte solution hydration. Suspect transient vertigo, possible viral labyrinthitis. Neuro exam benign. No positional or orthostatic symptoms today. Advised to monitor blood pressure and call if less than 120 or greater than 160 systolic. Also to call if recurrent dizziness or new weakness symptoms arise.  Time spent with pt/family today 25 minutes, greater than 50% time spent counseling patient on dizziness, biliary colic, plans for workup and treatment of same and medication review. Also review of prior records   Tamara Paci, MD

## 2014-06-24 LAB — NMR LIPOPROFILE WITH LIPIDS
CHOLESTEROL, TOTAL: 215 mg/dL — AB (ref 100–199)
HDL PARTICLE NUMBER: 28.5 umol/L — AB (ref >=30.5)
HDL Size: 9 nm — ABNORMAL LOW (ref >=9.2)
HDL-C: 51 mg/dL (ref >39)
LARGE HDL: 6.3 umol/L (ref >=4.8)
LDL CALC: 132 mg/dL — AB (ref 0–99)
LDL Particle Number: 1664 nmol/L — ABNORMAL HIGH (ref <1000)
LDL Size: 21.7 nm (ref >=20.8)
LP-IR SCORE: 59 — AB (ref <=45)
Large VLDL-P: 8.3 nmol/L — ABNORMAL HIGH (ref <=2.7)
Small LDL Particle Number: 408 nmol/L (ref <=527)
TRIGLYCERIDES: 161 mg/dL — AB (ref 0–149)
VLDL Size: 52.4 nm — ABNORMAL HIGH (ref <=46.6)

## 2014-06-28 ENCOUNTER — Other Ambulatory Visit (INDEPENDENT_AMBULATORY_CARE_PROVIDER_SITE_OTHER): Payer: Medicare Other

## 2014-06-28 DIAGNOSIS — R945 Abnormal results of liver function studies: Principal | ICD-10-CM

## 2014-06-28 DIAGNOSIS — R7989 Other specified abnormal findings of blood chemistry: Secondary | ICD-10-CM

## 2014-06-29 LAB — HEPATIC FUNCTION PANEL
ALK PHOS: 104 U/L (ref 39–117)
ALT: 17 U/L (ref 0–35)
AST: 18 U/L (ref 0–37)
Albumin: 4.1 g/dL (ref 3.5–5.2)
BILIRUBIN DIRECT: 0.1 mg/dL (ref 0.0–0.3)
Total Bilirubin: 0.3 mg/dL (ref 0.2–1.2)
Total Protein: 7.5 g/dL (ref 6.0–8.3)

## 2014-07-01 ENCOUNTER — Other Ambulatory Visit: Payer: Self-pay | Admitting: Internal Medicine

## 2014-07-01 ENCOUNTER — Telehealth: Payer: Self-pay | Admitting: Internal Medicine

## 2014-07-01 ENCOUNTER — Ambulatory Visit (HOSPITAL_COMMUNITY)
Admission: RE | Admit: 2014-07-01 | Discharge: 2014-07-01 | Disposition: A | Discharge status: Home / Self Care | Payer: Medicare Other | Source: Ambulatory Visit | Attending: Internal Medicine | Admitting: Internal Medicine

## 2014-07-01 DIAGNOSIS — R7989 Other specified abnormal findings of blood chemistry: Secondary | ICD-10-CM

## 2014-07-01 DIAGNOSIS — R109 Unspecified abdominal pain: Secondary | ICD-10-CM | POA: Insufficient documentation

## 2014-07-01 DIAGNOSIS — K8020 Calculus of gallbladder without cholecystitis without obstruction: Secondary | ICD-10-CM | POA: Insufficient documentation

## 2014-07-01 DIAGNOSIS — R945 Abnormal results of liver function studies: Secondary | ICD-10-CM

## 2014-07-01 LAB — POCT I-STAT CREATININE: Creatinine, Ser: 0.9 mg/dL (ref 0.50–1.10)

## 2014-07-01 MED ORDER — GADOBENATE DIMEGLUMINE 529 MG/ML IV SOLN
15.0000 mL | Freq: Once | INTRAVENOUS | Status: AC | PRN
  Administered 2014-07-01: 13 mL via INTRAVENOUS

## 2014-07-02 ENCOUNTER — Ambulatory Visit (HOSPITAL_COMMUNITY): Payer: Medicare Other

## 2014-07-02 NOTE — Telephone Encounter (Signed)
See results note. 

## 2014-07-04 DIAGNOSIS — M549 Dorsalgia, unspecified: Secondary | ICD-10-CM | POA: Diagnosis not present

## 2014-07-07 ENCOUNTER — Encounter: Payer: Medicare Other | Admitting: Internal Medicine

## 2014-07-08 ENCOUNTER — Encounter: Payer: Self-pay | Admitting: Internal Medicine

## 2014-07-08 ENCOUNTER — Other Ambulatory Visit: Payer: Self-pay | Admitting: Internal Medicine

## 2014-07-08 ENCOUNTER — Ambulatory Visit (INDEPENDENT_AMBULATORY_CARE_PROVIDER_SITE_OTHER): Payer: Medicare Other | Admitting: Internal Medicine

## 2014-07-08 VITALS — BP 114/66 | HR 73 | Temp 97.6°F | Resp 12 | Wt 150.0 lb

## 2014-07-08 DIAGNOSIS — M949 Disorder of cartilage, unspecified: Secondary | ICD-10-CM

## 2014-07-08 DIAGNOSIS — M899 Disorder of bone, unspecified: Secondary | ICD-10-CM

## 2014-07-08 NOTE — Progress Notes (Signed)
Patient ID: Tamara Robertson, female   DOB: 10-28-1929, 81 y.o.   MRN: [id number]   HPI  Tamara Robertson is a 81 y.o.-year-old female, initially referred by Tamara Robertson, now returning for f/u for osteopenia. Last visit 1 year ago.  Pt was dx with osteopenia when she started Prednisone ~15 years ago for ITP >> 60 mg daily, then now on Prednisone 5 mg every other day.   Spine MRI from 2011: no vb fracture.  She had a syncopal episode 2/2 dehydration (diarrhea) in 03/2014 >> she was told she may have had a coccyx fracture - no Xrays done. Pain resolved in 6 weeks.  She had a ? small fracture in R hip 40 years ago (bone chipped?).   I reviewed pt's DEXA scans: Date L1-L4 T score FN T score FRAX  02/28/2011 (Spine scoliotic and rotated!) -0.1 RFN: -1.6 LFN: -1.6 - calculated I believe counting in her Prednisone:   20% MOF risk  6.7% hip fx risk   Date L1-L4 T score FN T score FRAX  07/13/2013 (Spine scoliotic and rotated!) 0.6 RFN: -1.8 LFN: -1.5 - calculated I believe counting in her Prednisone:   15% MOF risk  4.3% hip fx risk  She would qualify for tx based on the >3% 10 year hip fx risk. She has not been on OP treatments other than Ca + vit D.   At last visit, I suggested to start Prolia or Reclast >> refused.  No h/o hyper/hypocalcemia. No h/o hyperparathyroidism. No h/o kidney stones. Lab Results  Component Value Date   CALCIUM 9.6 03/26/2014   CALCIUM 9.6 09/23/2013   CALCIUM 9.3 02/11/2013   CALCIUM 9.6 01/29/2013   CALCIUM 9.5 12/19/2012   CALCIUM 9.5 08/12/2012   CALCIUM 9.2 06/03/2012   CALCIUM 9.7 04/17/2012   CALCIUM 9.1 02/12/2012   CALCIUM 9.5 08/16/2011   No h/o vitamin D deficiency: Component     Latest Ref Rng 07/06/2013  Vit D, 25-Hydroxy     30 - 89 ng/mL 61   Pt is on calcium citrate and vitamin D: 600-500 mg-units daily, in am, after b'fast (if >1x a day >> constipation) + from MVI. She also eats dairy and green, leafy, vegetables.   She is on a PPI by Tamara.  Rhea Robertson.  At last visit, she started to work with a Systems analyst, doing weight bearing exercises >> she could not continue b/c pain >> now yoga and walking 1 mile a day.   No h/o CKD. Last BUN/Cr: Lab Results  Component Value Date   BUN 19.0 03/26/2014   CREATININE 0.90 07/01/2014   Menopause was at 3+ y/o (hysterectomy + unilat oophorectomy in her 30s).  ROS: Constitutional: + weight gain, no  fatigue, no hot flushes Eyes: No blurry vision, no xerophthalmia ENT: No sore throat, no nodules palpated in throat, no dysphagia/odynophagia, no hoarseness, + tinnitus, + decreased hearing Cardiovascular: No CP/SOB/palpitations/+ leg swelling Respiratory: No cough/+ SOB Gastrointestinal: no N/V/D/+ C, + heartburn Musculoskeletal: + muscle aches/+ joint aches Skin: no rashes Neurological: no tremors/numbness/tingling/dizziness  I reviewed pt's medications, allergies, PMH, social hx, family hx, and changes were documented in the history of present illness. Otherwise, unchanged from my initial visit note. She stopped pravastatin. She also decrease Protonix from 40-20 mg daily. She started nystatin powder.  Past Medical History  Diagnosis Date  . OSTEOARTHRITIS, KNEE, RIGHT   . URINARY INCONTINENCE   . Irritable bowel syndrome   . DIVERTICULITIS, HX OF   .  OSTEOPENIA   . Immune thrombocytopenic purpura     chronic - baseline 80-100K, on pred  . UNSPECIFIED PERIPHERAL VASCULAR DISEASE   . OVERACTIVE BLADDER   . DYSLIPIDEMIA   . Glaucoma   . GERD   . HOH (hard of hearing)   . CAD (coronary artery disease)     RCA 40% stenosis cath 01/2011  . Left ovarian cyst dx 01/2013 CT    working with gyn, ?malignant - elevated tumor marker OVA1   Past Surgical History  Procedure Laterality Date  . Splenectomy  1954  . Abdominal hysterectomy  1963  . L pop pta  10/2009  . Laparoscopic sigmoid colectomy  10/2005  . Cataract extraction, bilateral  10/2010  . Knee arthroscopy Right   .  Angioplasty    . Cardiac catheterization     History   Social History  . Marital Status: Married    Spouse Name: N/A    Number of Children: 3   Occupational History  . Retired    Social History Main Topics  . Smoking status: Never Smoker   . 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 Alcohol Use: Yes     Comment: rarely, socially - winer  . Drug Use: No   Current Outpatient Prescriptions on File Prior to Visit  Medication Sig Dispense Refill  . ALPRAZolam (XANAX) 0.25 MG tablet Take 1 tablet (0.25 mg total) by mouth 2 (two) times daily as needed for anxiety. 20 tablet 0  . Biotin 2500 MCG CAPS Take by mouth daily.    . Calcium Citrate (CITRACAL PO) One capful three times a week    . Calcium-Magnesium-Vitamin D (CITRACAL CALCIUM+D) 600-40-500 MG-MG-UNIT TB24 Take 1 tablet by mouth daily.     . dorzolamide-timolol (COSOPT) 22.3-6.8 MG/ML ophthalmic solution Place 1 drop into both eyes 2 (two) times daily.     . Flaxseed MISC by Does not apply route. Take 1 tsp daily    . hydrochlorothiazide (MICROZIDE) 12.5 MG capsule TAKE ONE CAPSULE BY MOUTH DAILY AS NEEDED for swelling and edema 30 capsule 1  . Magnesium 250 MG TABS Take 250 mg by mouth daily.    . Multiple Vitamin (MULTIVITAMIN) tablet Take 1 tablet by mouth daily.      [name] nystatin (MYCOSTATIN/NYSTOP) 100000 UNIT/GM POWD Apply 1 g topically 2 (two) times daily. 60 g 3  . pantoprazole (PROTONIX) 20 MG tablet Take 1 tablet (20 mg total) by mouth daily. 90 tablet 1  . polyethylene glycol (MIRALAX / GLYCOLAX) packet Take 17 g by mouth every 3 (three) days.    . predniSONE (DELTASONE) 2.5 MG tablet Take 2.5 mg by mouth daily.    . Probiotic Product (PROBIOTIC FORMULA PO) Take 1 tablet by mouth daily.      . simethicone (PHAZYME) 125 MG chewable tablet Chew 1 tablet (125 mg total) by mouth every 6 (six) hours as needed  for flatulence. 30 tablet 0   No current facility-administered medications on file prior to visit.   Allergies  Allergen Reactions  . Aspirin Other (See Comments)    ITP  . Contrast Media [Iodinated Diagnostic Agents]     hives  . Stati, now returning for f/u for osteopenia. Last visit 1 year ago.  Pt was dx with osteopenia when she started Prednisone ~15 years ago for ITP >> 60 mg daily, then now on Prednisone 5 mg every other day.   Spine MRI from 2011: no vb fracture.  She had a syncopal episode 2/2 dehydration (diarrhea) in 03/2014 >> she was told she may have had a coccyx fracture - no Xrays done. Pain resolved in 6 weeks.  She had a ? small fracture in R hip 40 years ago (bone chipped?).   I reviewed pt's DEXA scans: Date L1-L4 T score FN T score FRAX  02/28/2011 (Spine scoliotic and rotated!) -0.1 RFN: -1.6 LFN: -1.6 - calculated I believe counting in her Prednisone:   20% MOF risk  6.7% hip fx risk   Date L1-L4 T score FN T score FRAX  07/13/2013 (Spine scoliotic and rotated!) 0.6 RFN: -1.8 LFN: -1.5 - calculated I believe counting in her Prednisone:   15% MOF risk  4.3% hip fx risk  She would qualify for tx based on the >3% 10 year hip fx risk. She has not been on OP treatments other than Ca + vit D.   At last visit, I suggested to start Prolia or Reclast >> refused.  No h/o hyper/hypocalcemia. No h/o hyperparathyroidism. No h/o kidney stones. Lab Results  Component Value Date   CALCIUM 9.6 03/26/2014   CALCIUM 9.6 09/23/2013   CALCIUM 9.3 02/11/2013   CALCIUM 9.6 01/29/2013   CALCIUM 9.5 12/19/2012   CALCIUM 9.5 08/12/2012   CALCIUM 9.2 06/03/2012   CALCIUM 9.7 04/17/2012   CALCIUM 9.1 02/12/2012   CALCIUM 9.5 08/16/2011   No h/o vitamin D deficiency: Component     Latest Ref Rng 07/06/2013  Vit D, 25-Hydroxy     30 - 89 ng/mL 61   Pt is on calcium citrate and vitamin D: 600-500 mg-units daily, in am, after b'fast (if >1x a day >> constipation) + from MVI. She also eats dairy and green, leafy, vegetables.   She is on a PPI by Tamara.  Rhea Robertson.  At last visit, she started to work with a Systems analyst, doing weight bearing exercises >> she could not continue b/c pain >> now yoga and walking 1 mile a day.   No h/o CKD. Last BUN/Cr: Lab Results  Component Value Date   BUN 19.0 03/26/2014   CREATININE 0.90 07/01/2014   Menopause was at 3+ y/o (hysterectomy + unilat oophorectomy in her 30s).  ROS: Constitutional: + weight gain, no  fatigue, no hot flushes Eyes: No blurry vision, no xerophthalmia ENT: No sore throat, no nodules palpated in throat, no dysphagia/odynophagia, no hoarseness, + tinnitus, + decreased hearing Cardiovascular: No CP/SOB/palpitations/+ leg swelling Respiratory: No cough/+ SOB Gastrointestinal: no N/V/D/+ C, + heartburn Musculoskeletal: + muscle aches/+ joint aches Skin: no rashes Neurological: no tremors/numbness/tingling/dizziness  I reviewed pt's medications, allergies, PMH, social hx, family hx, and changes were documented in the history of present illness. Otherwise, unchanged from my initial visit note. She stopped pravastatin. She also decrease Protonix from 40-20 mg daily. She started nystatin powder.  Past Medical History  Diagnosis Date  . OSTEOARTHRITIS, KNEE, RIGHT   . URINARY INCONTINENCE   . Irritable bowel syndrome   . DIVERTICULITIS, HX OF   .  OSTEOPENIA   . Immune thrombocytopenic purpura     chronic - baseline 80-100K, on pred  . UNSPECIFIED PERIPHERAL VASCULAR DISEASE   . OVERACTIVE BLADDER   . DYSLIPIDEMIA   . Glaucoma   . GERD   . HOH (hard of hearing)   . CAD (coronary artery disease)     RCA 40% stenosis cath 01/2011  . Left ovarian cyst dx 01/2013 CT    working with gyn, ?malignant - elevated tumor marker OVA1   Past Surgical History  Procedure Laterality Date  . Splenectomy  1954  . Abdominal hysterectomy  1963  . L pop pta  10/2009  . Laparoscopic sigmoid colectomy  10/2005  . Cataract extraction, bilateral  10/2010  . Knee arthroscopy Right   .  Angioplasty    . Cardiac catheterization     History   Social History  . Marital Status: Married    Spouse Name: N/A    Number of Children: 3   Occupational History  . Retired    Social History Main Topics  . Smoking status: Never Smoker   . 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 Alcohol Use: Yes     Comment: rarely, socially - winer  . Drug Use: No   Current Outpatient Prescriptions on File Prior to Visit  Medication Sig Dispense Refill  . ALPRAZolam (XANAX) 0.25 MG tablet Take 1 tablet (0.25 mg total) by mouth 2 (two) times daily as needed for anxiety. 20 tablet 0  . Biotin 2500 MCG CAPS Take by mouth daily.    . Calcium Citrate (CITRACAL PO) One capful three times a week    . Calcium-Magnesium-Vitamin D (CITRACAL CALCIUM+D) 600-40-500 MG-MG-UNIT TB24 Take 1 tablet by mouth daily.     . dorzolamide-timolol (COSOPT) 22.3-6.8 MG/ML ophthalmic solution Place 1 drop into both eyes 2 (two) times daily.     . Flaxseed MISC by Does not apply route. Take 1 tsp daily    . hydrochlorothiazide (MICROZIDE) 12.5 MG capsule TAKE ONE CAPSULE BY MOUTH DAILY AS NEEDED for swelling and edema 30 capsule 1  . Magnesium 250 MG TABS Take 250 mg by mouth daily.    . Multiple Vitamin (MULTIVITAMIN) tablet Take 1 tablet by mouth daily.      [name] nystatin (MYCOSTATIN/NYSTOP) 100000 UNIT/GM POWD Apply 1 g topically 2 (two) times daily. 60 g 3  . pantoprazole (PROTONIX) 20 MG tablet Take 1 tablet (20 mg total) by mouth daily. 90 tablet 1  . polyethylene glycol (MIRALAX / GLYCOLAX) packet Take 17 g by mouth every 3 (three) days.    . predniSONE (DELTASONE) 2.5 MG tablet Take 2.5 mg by mouth daily.    . Probiotic Product (PROBIOTIC FORMULA PO) Take 1 tablet by mouth daily.      . simethicone (PHAZYME) 125 MG chewable tablet Chew 1 tablet (125 mg total) by mouth every 6 (six) hours as needed  for flatulence. 30 tablet 0   No current facility-administered medications on file prior to visit.   Allergies  Allergen Reactions  . Aspirin Other (See Comments)    ITP  . Contrast Media [Iodinated Diagnostic Agents]     hives  . Statins    Family History  Problem Relation Age of Onset  . Coronary artery disease Mother   . Heart attack Mother 78  . Hyperlipidemia Mother   .  Hypertension Mother   . Stomach cancer Father   . Hypertension Daughter   . Hyperlipidemia Daughter   . Arthritis      parent  . Transient ischemic attack      parent  . Colon cancer Neg Hx    PE: BP 114/66 mmHg  Pulse 73  Temp(Src) 97.6 F (36.4 C) (Oral)  Resp 12  Wt 150 lb (68.04 kg)  SpO2 98% Body mass index is 27.43 kg/(m^2).  Wt Readings from Last 3 Encounters:  07/08/14 150 lb (68.04 kg)  06/22/14 149 lb 4 oz (67.699 kg)  06/18/14 149 lb (67.586 kg)   Constitutional: overweight, in NAD. + kyphosis. Eyes: PERRLA, EOMI, no exophthalmos ENT: moist mucous membranes, no thyromegaly, no cervical lymphadenopathy Cardiovascular: RRR, No MRG Respiratory: CTA B Gastrointestinal: abdomen soft, NT, ND, BS+ Musculoskeletal: no deformities, strength intact in all 4. No spine tenderness to spine percussion. Skin: moist, warm, no rashes Neurological: no tremor with outstretched hands, DTR normal in all 4  Assessment: 1. Osteopenia - ? Osteoporosis 2/2 possible recent coccygeal fx  Plan: 1. Osteopenia (? Osteoporosis) - likely postmenopausal (+ less likely steroid induced) - She had a recent possible coccyx fracture although this was not substantiated on an x-ray - Patient is very reticent to start any medicines for osteoporosis - We reviewed together and compared the last 2 DEXA scan reports, the FRAX score improved between 2012 and 2015. This is good news! - we reviewed her dietary and supplemental calcium and vitamin D intake, which I believe are adequate, I advised her to continue there  current supplementation for now. - We will check a Vitamin D today - Continue to stay active, advised her to increase walking to 2 miles a day now that spring is coming - will check a new DEXA scan in a year and if decreases further, I would strongly encourage treatment, likely in the form of zoledronic acid or denosumab 2/2 her acid reflux - will schedule to see pt back in a year. I advised her to let me know if she develops a new fracture.  Orders Only on 07/08/2014  Component Date Value Ref Range Status  . Vit D, 25-Hydroxy 07/08/2014 44  30 - 100 ng/mL Final   Comment: Vitamin D Status           25-OH Vitamin D        Deficiency                <20 ng/mL        Insufficiency         20 - 29 ng/mL        Optimal             > or = 30 ng/mL   For 25-OH Vitamin D testing on patients on D2-supplementation and patients for whom quantitation of D2 and D3 fractions is required, the QuestAssureD 25-OH VIT D, (D2,D3), LC/MS/MS is recommended: order code 56213 (patients > 2 yrs).   Will continue current vitamin D supplementation.

## 2014-07-08 NOTE — Patient Instructions (Signed)
Please stop at the lab.  Please return in 07/2016. Please call me 05/2015 to schedule a new DEXA and the visit.

## 2014-07-09 LAB — VITAMIN D 25 HYDROXY (VIT D DEFICIENCY, FRACTURES): Vit D, 25-Hydroxy: 44 ng/mL (ref 30–100)

## 2014-07-20 ENCOUNTER — Encounter: Payer: Self-pay | Admitting: *Deleted

## 2014-07-21 ENCOUNTER — Ambulatory Visit: Payer: Medicare Other | Admitting: Internal Medicine

## 2014-07-21 ENCOUNTER — Ambulatory Visit (INDEPENDENT_AMBULATORY_CARE_PROVIDER_SITE_OTHER): Payer: Medicare Other | Admitting: Internal Medicine

## 2014-07-21 ENCOUNTER — Encounter: Payer: Self-pay | Admitting: Internal Medicine

## 2014-07-21 VITALS — BP 142/70 | HR 80 | Ht 62.0 in | Wt 149.0 lb

## 2014-07-21 DIAGNOSIS — K8020 Calculus of gallbladder without cholecystitis without obstruction: Secondary | ICD-10-CM | POA: Diagnosis not present

## 2014-07-21 DIAGNOSIS — K219 Gastro-esophageal reflux disease without esophagitis: Secondary | ICD-10-CM

## 2014-07-21 DIAGNOSIS — R7989 Other specified abnormal findings of blood chemistry: Secondary | ICD-10-CM | POA: Diagnosis not present

## 2014-07-21 DIAGNOSIS — R945 Abnormal results of liver function studies: Secondary | ICD-10-CM

## 2014-07-21 DIAGNOSIS — R1011 Right upper quadrant pain: Secondary | ICD-10-CM | POA: Diagnosis not present

## 2014-07-21 MED ORDER — FAMOTIDINE 20 MG PO TABS
20.0000 mg | ORAL_TABLET | Freq: Two times a day (BID) | ORAL | Status: DC
Start: 2014-07-21 — End: 2014-09-13

## 2014-07-21 MED ORDER — URSODIOL 300 MG PO CAPS
300.0000 mg | ORAL_CAPSULE | Freq: Two times a day (BID) | ORAL | Status: DC
Start: 2014-07-21 — End: 2014-11-22

## 2014-07-21 NOTE — Progress Notes (Signed)
Subjective:    Patient ID: Tamara Robertson, female    DOB: 1930/02/13, 81 y.o.   MRN: [id number]  HPI Tamara Robertson is an 81 yo female with PMH of long-standing IBS, diverticulitis status post sigmoidectomy in 2007, ITP status post splenectomy, GERD, gallstones who seen for follow-up. She was previously seen in the office by Willette Cluster, NP. She's been having these attacks of epigastric abdominal pain. These began in the epigastrium near the xiphoid and radiate into the right upper quadrant and then through to her back. These seem to "always happen at night". She feels like it is spasm. She's tried to avoid fatty food. She can develop dyspnea. She was seen by Dr. Michaell Cowing for possible gallbladder pain. She reports "he did not think it was my gallbladder". These attacks are unpredictable. She's been avoiding greasy and fried and fatty foods. She continues to take pantoprazole 20 mg daily and has had no heartburn symptoms. Appetite remains very good without weight loss. Bowel movements for her been regular without blood in her stool or melena.  Of note after seeing Gunnar Fusi her liver enzymes were noted to be elevated, this prompted me to order MRCP.   Review of Systems As per history of present illness, otherwise negative  Current Medications, Allergies, Past Medical History, Past Surgical History, Family History and Social History were reviewed in [employer] record.     Objective:   Physical Exam BP 142/70 mmHg  Pulse 80  Ht 5\' 2"  (1.575 m)  Wt 149 lb (67.586 kg)  BMI 27.25 kg/m2 Constitutional: Well-developed and well-nourished. No distress. HEENT: Normocephalic and atraumatic. Oropharynx is clear and moist. No oropharyngeal exudate. Conjunctivae are normal.  No scleral icterus. Neck: Neck supple. Trachea midline. Cardiovascular: Normal rate, regular rhythm and intact distal pulses. No M/R/G Pulmonary/chest: Effort normal and breath sounds normal. No wheezing, rales or  rhonchi. Abdominal: Soft, mild RUQ tenderness without rebound or guarding, nondistended. Bowel sounds active throughout. There are no masses palpable. No hepatosplenomegaly. Extremities: no clubbing, cyanosis, or edema Lymphadenopathy: No cervical adenopathy noted. Neurological: Alert and oriented to person place and time. Skin: Skin is warm and dry. No rashes noted. Psychiatric: Normal mood and affect. Behavior is normal.   Results for TAMRYN, SCULL (MRN [id number]) as of 07/21/2014 18:44  Ref. Range 02/11/2013 10:01 07/06/2013 12:02 09/23/2013 12:52 03/19/2014 08:28 03/26/2014 12:53 06/18/2014 11:30 06/22/2014 08:25 06/28/2014 14:40  Alkaline Phosphatase Latest Range: 39-117 U/L 80  95  112 150 (H) 114 104  Albumin Latest Range: 3.5-5.2 g/dL 3.5  4.0  3.8 4.1 3.9 4.1  AST Latest Range: 0-37 U/L 19  22  23  38 (H) 21 18  ALT Latest Range: 0-35 U/L 15  17  19  102 (H) 38 (H) 17  Total Protein Latest Range: 6.4-8.3 g/dL 6.8  7.4  7.2 8.0 7.3 7.5  Bilirubin, Direct Latest Range: 0.0-0.3 mg/dL      0.2 0.1 0.1  Total Bilirubin Latest Range: 0.2-1.2 mg/dL 0.86  5.78  4.69 0.5 0.5 0.3    CLINICAL DATA:  Epigastric pain.  History of splenectomy.   EXAM: ULTRASOUND ABDOMEN COMPLETE   COMPARISON:  None.   FINDINGS: Gallbladder: There are multiple gallstones. The largest is 9 mm. No Murphy sign or wall thickening. No pericholecystic fluid.   Common bile duct: Diameter: 4 mm in caliber.   Liver: Increased echogenicity throughout the liver without focal mass is present.   IVC: No abnormality visualized.   Pancreas: Visualized portion unremarkable.  Spleen: Absent.   Right Kidney: Length: 10.5 cm in length. Echogenicity within normal limits. No mass or hydronephrosis visualized. Upper pole exophytic 2.1 cm simple cyst.   Left Kidney: Length: 11.1 cm in length. Echogenicity within normal limits. No mass or hydronephrosis visualized.   Abdominal aorta: No aneurysm visualized.   Other  findings: None.   IMPRESSION: Cholelithiasis.   Diffuse hepatic steatosis.   Post splenectomy.   Benign simple cyst in the right kidney.     Electronically Signed   By: Maryclare Bean M.D.   On: 04/06/2014 10:09   CLINICAL DATA:  Abdominal pain. Elevated liver function tests. Cholelithiasis. Previous splenectomy. __________________________________________________________________________________________  Francia Greaves: MRI ABDOMEN WITHOUT AND WITH CONTRAST (INCLUDING MRCP)   TECHNIQUE: Multiplanar multisequence MR imaging of the abdomen was performed both before and after the administration of intravenous contrast. Heavily T2-weighted images of the biliary and pancreatic ducts were obtained, and three-dimensional MRCP images were rendered by post processing.   CONTRAST:  13mL MULTIHANCE GADOBENATE DIMEGLUMINE 529 MG/ML IV SOLN   COMPARISON:  CT on 02/02/2013   FINDINGS: Lower chest:  Unremarkable.   Hepatobiliary: Diffuse hepatic steatosis demonstrated on chemical shift imaging. 10 mm T2 hyperintense flash filling hemangioma is again seen in the inferior right hepatic lobe which is stable compared to previous exam. No other liver lesions are identified.   Cholelithiasis is demonstrated, without evidence of cholecystitis. No evidence of biliary ductal dilatation or choledocholithiasis. The common bile duct measures 5 mm in diameter.   Pancreas: No masses, inflammatory changes, or fluid collections demonstrated. No evidence of pancreatic ductal dilatation or pancreas divisum.   Spleen:  Surgically absent.   Adrenal Glands:  No masses identified.   Kidneys: No renal masses identified. A cyst is seen in the upper pole the right kidney measuring 2.8 cm. This shows a few fine internal septations without contrast enhancement or other complex features. This consistent with a benign Bosniak category 2 cyst, and remains stable compared to previous CT in 2014. No evidence  of hydronephrosis.   Stomach/Bowel/Peritoneum: No evidence of wall thickening, mass, or obstruction involving visualized abdominal bowel.   Vascular/Lymphatic: No pathologically enlarged lymph nodes identified. No other significant abnormality noted.   Other:  None.   Musculoskeletal:  No suspicious bone lesions identified.   IMPRESSION: Cholelithiasis. No radiographic signs of cholecystitis, biliary dilatation, or choledocholithiasis.   Diffuse hepatic steatosis. 1 cm benign hemangioma in the inferior right hepatic lobe, which remains stable compared to previous CT.     Electronically Signed   By: Myles Rosenthal M.D.   On: 07/01/2014 09:37     Assessment & Plan:  81 yo female with PMH of long-standing IBS, diverticulitis status post sigmoidectomy in 2007, ITP status post splenectomy, GERD, gallstones who seen for follow-up.  1. Episodic RUQ pain associated with Nausea/elevated LFTs -- her attacks are consistent with symptomatic biliary colic. I do not think this is gastritis or stomach/small bowel in etiology. This is most evidenced by her bump in liver enzymes seen after a recent attack in February, which to me clinches the diagnosis. I was fearful that she had a CBD stone given the rise in liver enzymes, however MRCP did not show CBD stone. Both ultrasound and MRCP have confirmed cholelithiasis. We discussed the risks of gallstones which includes cholecystitis, biliary obstruction and pancreatitis. She is appropriately concerned about a surgery at age 60 but fears these attacks which are quite painful for her. --We had a long discussion regarding gallstones and these  painful episodes. She is willing to speak with surgery about this pain, but would prefer to meet with a different surgeon if she returns for consultation. She would like to discuss this more thorough husband and will let us know soon --He discussed medical options which are limited. Ursodiol is indicated for stone  dissolution, though doesn't always work well. She is willing to try so we will prescribe ursodiol 300 mg twice a day which is a weight-based dose. We discussed the possible side effects and I asked that she call me should these occur  2. GERD -- well-controlled on pantoprazole 20 mg. She has read about possible side effects of PPI which we reviewed. She would like to try H2 blocker instead. Change to Pepcid 20 mg twice a day. If heartburn or reflux symptoms return she is asked to notify me. She voices understanding  Return in 3 months

## 2014-07-21 NOTE — Patient Instructions (Signed)
We have sent the following medications to your pharmacy for you to pick up at your convenience: pepcid 20 mg twice daily (in place of pantoprazole) Ursodiol 300 mg twice daily for gallstones  Please follow up with Dr Hilarie Fredrickson in 3 months.  Please discuss surgical referral for gallbladder removal with your husband and call us back if you wish to proceed.  Call our office if you have increasing severity or frequency of gallbladder attacks. Our phone number is [phone].

## 2014-07-22 ENCOUNTER — Encounter: Payer: Self-pay | Admitting: Internal Medicine

## 2014-07-23 NOTE — Telephone Encounter (Signed)
Please tell patient thanks for the email Okay for tramadol 50 mg every 6 hours PRN, #60, refill #1 No specific dietary restrictions, other than trying to adhere to low fat diet which lessens stimulation of the gallbladder (likely eats this way already) Office followup with me in 3 months Will hold off on [facility] referral for now Thank her for the brownies

## 2014-07-26 ENCOUNTER — Other Ambulatory Visit: Payer: Self-pay

## 2014-07-26 MED ORDER — TRAMADOL HCL 50 MG PO TABS: 50.0000 mg | ORAL_TABLET | Freq: Four times a day (QID) | ORAL | Status: DC | PRN

## 2014-07-26 NOTE — Telephone Encounter (Signed)
Vaughan Basta, I saw the email from Mrs. Loder Please refer her to Serita Grammes or Armandina Gemma please Specific question is risks/benefits of cholecystectomy in pt with recurrent attacks of likely biliary pain and 1 episode of elevated LFTs She saw Neysa Bonito before but has requested another surgeon for this consultation

## 2014-08-09 ENCOUNTER — Other Ambulatory Visit: Payer: Self-pay | Admitting: Internal Medicine

## 2014-08-10 ENCOUNTER — Encounter: Payer: Self-pay | Admitting: Internal Medicine

## 2014-08-10 DIAGNOSIS — H00012 Hordeolum externum right lower eyelid: Secondary | ICD-10-CM | POA: Diagnosis not present

## 2014-08-10 NOTE — Telephone Encounter (Signed)
Please prescribed pantoprazole 20 mg once daily, okay for 90 day supply with one year refill I will await to hear from Dr. Donne Hazel, let her know to call me with any questions I hope she and her husband are well

## 2014-08-12 ENCOUNTER — Other Ambulatory Visit: Payer: Self-pay

## 2014-08-12 ENCOUNTER — Encounter: Payer: Self-pay | Admitting: Internal Medicine

## 2014-08-12 ENCOUNTER — Telehealth: Payer: Self-pay | Admitting: Internal Medicine

## 2014-08-12 ENCOUNTER — Telehealth: Payer: Self-pay | Admitting: *Deleted

## 2014-08-12 MED ORDER — PANTOPRAZOLE SODIUM 20 MG PO TBEC: 20.0000 mg | DELAYED_RELEASE_TABLET | Freq: Every day | ORAL | Status: DC

## 2014-08-12 NOTE — Telephone Encounter (Signed)
VOICE MAIL FROM 10:36 DR.LESCHBER PRESCRIBED PREDNISONE FOR PT. DR.LESCHBER IS NO LONGER SEEING PATIENTS. PT. WILL BE SEEING ANOTHER PHYSICIAN IN THE PRACTICE. SUGGESTED PT. CALL DR.LESCHBER'S OFFICE FOR HER PREDNISONE REFILL. IF THERE IS A PROBLEM GETTING THIS REFILL PT. WILL CALL THIS OFFICE BACK.

## 2014-08-12 NOTE — Telephone Encounter (Signed)
Pt called in and looking for a refill on her prednisone  [facility]  [place] [phone]

## 2014-08-12 NOTE — Telephone Encounter (Signed)
Please advise if it is okay to fill rx for Prednisone?   Thanks!

## 2014-08-16 ENCOUNTER — Encounter: Payer: Self-pay | Admitting: Internal Medicine

## 2014-08-16 ENCOUNTER — Other Ambulatory Visit: Payer: Self-pay | Admitting: General Surgery

## 2014-08-16 DIAGNOSIS — K8020 Calculus of gallbladder without cholecystitis without obstruction: Secondary | ICD-10-CM | POA: Diagnosis not present

## 2014-08-16 MED ORDER — PREDNISONE 2.5 MG PO TABS: 2.5000 mg | ORAL_TABLET | Freq: Every day | ORAL | Status: DC

## 2014-08-16 NOTE — Telephone Encounter (Signed)
Ok erx done

## 2014-08-16 NOTE — Telephone Encounter (Signed)
LMOVM advising patient of approval. Thanks

## 2014-08-17 ENCOUNTER — Telehealth: Payer: Self-pay | Admitting: Internal Medicine

## 2014-08-17 NOTE — Telephone Encounter (Signed)
Apt is set for injection for prevnar pneumonia

## 2014-08-17 NOTE — Telephone Encounter (Signed)
Pt called stated is due for a pneumonia injection, she didn't talk to Dr. Asa Lente about this. Please advise, can we schedule one for her?

## 2014-08-17 NOTE — Telephone Encounter (Signed)
Per chart pt had pneumonia Poly done 02/2011. She can get the prevnar pneumonia recommended for 65 & over. She can get it when she come in for her CPX in July if she want too...Johny Chess

## 2014-08-18 DIAGNOSIS — D485 Neoplasm of uncertain behavior of skin: Secondary | ICD-10-CM | POA: Diagnosis not present

## 2014-08-18 DIAGNOSIS — D0439 Carcinoma in situ of skin of other parts of face: Secondary | ICD-10-CM | POA: Diagnosis not present

## 2014-08-18 DIAGNOSIS — D2271 Melanocytic nevi of right lower limb, including hip: Secondary | ICD-10-CM | POA: Diagnosis not present

## 2014-08-18 DIAGNOSIS — L603 Nail dystrophy: Secondary | ICD-10-CM | POA: Diagnosis not present

## 2014-08-18 DIAGNOSIS — L814 Other melanin hyperpigmentation: Secondary | ICD-10-CM | POA: Diagnosis not present

## 2014-08-18 DIAGNOSIS — L821 Other seborrheic keratosis: Secondary | ICD-10-CM | POA: Diagnosis not present

## 2014-08-18 DIAGNOSIS — L820 Inflamed seborrheic keratosis: Secondary | ICD-10-CM | POA: Diagnosis not present

## 2014-08-18 DIAGNOSIS — R52 Pain, unspecified: Secondary | ICD-10-CM

## 2014-08-24 ENCOUNTER — Ambulatory Visit (INDEPENDENT_AMBULATORY_CARE_PROVIDER_SITE_OTHER): Payer: Medicare Other | Admitting: *Deleted

## 2014-08-24 DIAGNOSIS — Z23 Encounter for immunization: Secondary | ICD-10-CM | POA: Diagnosis not present

## 2014-08-26 DIAGNOSIS — C44319 Basal cell carcinoma of skin of other parts of face: Secondary | ICD-10-CM | POA: Diagnosis not present

## 2014-09-08 ENCOUNTER — Encounter: Payer: Self-pay | Admitting: *Deleted

## 2014-09-08 ENCOUNTER — Telehealth: Payer: Self-pay | Admitting: Oncology

## 2014-09-08 NOTE — Telephone Encounter (Signed)
Pt called to r/s labs/ov due to surgery, pt confirmed updated schedule for June.... KJ

## 2014-09-09 ENCOUNTER — Ambulatory Visit: Payer: Medicare Other | Admitting: Internal Medicine

## 2014-09-13 NOTE — Pre-Procedure Instructions (Signed)
Tamara Robertson  09/13/2014   Your procedure is scheduled on:  Thurs, May 12 @ 1:00 PM  Report to [facility]  at 11:00 AM.  Call this number if you have problems the morning of surgery: [phone]   Remember:   Do not eat food or drink liquids after midnight.   Take these medicines the morning of surgery with A SIP OF WATER: Alprazolam(Xanax),Pantoprazole(Protonix),Prednisone(Deltasone),and Tramadol(Ultram)              Stop taking your Vitamins and any Herbal Medications. No Goody's,BC's,Aleve,Aspirin,and Fish Oil.    Do not wear jewelry, make-up or nail polish.  Do not wear lotions, powders, or perfumes. You may wear deodorant.  Do not shave 48 hours prior to surgery.   Do not bring valuables to the hospital.  [facility] is not responsible                  for any belongings or valuables.               Contacts, dentures or bridgework may not be worn into surgery.  Leave suitcase in the car. After surgery it may be brought to your room.  For patients admitted to the hospital, discharge time is determined by your                treatment team.               Patients discharged the day of surgery will not be allowed to drive  home.    Special Instructions:  [place] - Preparing for Surgery  Before surgery, you can play an important role.  Because skin is not sterile, your skin needs to be as free of germs as possible.  You can reduce the number of germs on you skin by washing with CHG (chlorahexidine gluconate) soap before surgery.  CHG is an antiseptic cleaner which kills germs and bonds with the skin to continue killing germs even after washing.  Please DO NOT use if you have an allergy to CHG or antibacterial soaps.  If your skin becomes reddened/irritated stop using the CHG and inform your nurse when you arrive at Short Stay.  Do not shave (including legs and underarms) for at least 48 hours prior to the first CHG shower.  You may shave your face.  Please follow  these instructions carefully:   1.  Shower with CHG Soap the night before surgery and the                                morning of Surgery.  2.  If you choose to wash your hair, wash your hair first as usual with your       normal shampoo.  3.  After you shampoo, rinse your hair and body thoroughly to remove the                      Shampoo.  4.  Use CHG as you would any other liquid soap.  You can apply chg directly       to the skin and wash gently with scrungie or a clean washcloth.  5.  Apply the CHG Soap to your body ONLY FROM THE NECK DOWN.        Do not use on open wounds or open sores.  Avoid contact with your eyes,  ears, mouth and genitals (private parts).  Wash genitals (private parts)       with your normal soap.  6.  Wash thoroughly, paying special attention to the area where your surgery        will be performed.  7.  Thoroughly rinse your body with warm water from the neck down.  8.  DO NOT shower/wash with your normal soap after using and rinsing off       the CHG Soap.  9.  Pat yourself dry with a clean towel.            10.  Wear clean pajamas.            11.  Place clean sheets on your bed the night of your first shower and do not        sleep with pets.  Day of Surgery  Do not apply any lotions/deoderants the morning of surgery.  Please wear clean clothes to the hospital/surgery center.     Please read over the following fact sheets that you were given: Pain Booklet, Coughing and Deep Breathing and Surgical Site Infection Prevention

## 2014-09-14 ENCOUNTER — Encounter (HOSPITAL_COMMUNITY)
Admission: RE | Admit: 2014-09-14 | Discharge: 2014-09-14 | Disposition: A | Discharge status: Home / Self Care | Payer: Medicare Other | Source: Ambulatory Visit | Attending: General Surgery | Admitting: General Surgery

## 2014-09-14 ENCOUNTER — Encounter (HOSPITAL_COMMUNITY): Payer: Self-pay

## 2014-09-14 DIAGNOSIS — Z888 Allergy status to other drugs, medicaments and biological substances status: Secondary | ICD-10-CM | POA: Diagnosis not present

## 2014-09-14 DIAGNOSIS — K8010 Calculus of gallbladder with chronic cholecystitis without obstruction: Secondary | ICD-10-CM | POA: Diagnosis not present

## 2014-09-14 DIAGNOSIS — Z886 Allergy status to analgesic agent status: Secondary | ICD-10-CM | POA: Diagnosis not present

## 2014-09-14 DIAGNOSIS — K589 Irritable bowel syndrome without diarrhea: Secondary | ICD-10-CM | POA: Diagnosis not present

## 2014-09-14 DIAGNOSIS — D693 Immune thrombocytopenic purpura: Secondary | ICD-10-CM | POA: Diagnosis not present

## 2014-09-14 DIAGNOSIS — I2510 Atherosclerotic heart disease of native coronary artery without angina pectoris: Secondary | ICD-10-CM | POA: Diagnosis not present

## 2014-09-14 DIAGNOSIS — Z91041 Radiographic dye allergy status: Secondary | ICD-10-CM | POA: Diagnosis not present

## 2014-09-14 DIAGNOSIS — K913 Postprocedural intestinal obstruction: Secondary | ICD-10-CM | POA: Diagnosis not present

## 2014-09-14 DIAGNOSIS — Z7952 Long term (current) use of systemic steroids: Secondary | ICD-10-CM | POA: Diagnosis not present

## 2014-09-14 DIAGNOSIS — Z79899 Other long term (current) drug therapy: Secondary | ICD-10-CM | POA: Diagnosis not present

## 2014-09-14 DIAGNOSIS — Z90710 Acquired absence of both cervix and uterus: Secondary | ICD-10-CM | POA: Diagnosis not present

## 2014-09-14 DIAGNOSIS — E876 Hypokalemia: Secondary | ICD-10-CM | POA: Diagnosis not present

## 2014-09-14 DIAGNOSIS — K219 Gastro-esophageal reflux disease without esophagitis: Secondary | ICD-10-CM | POA: Diagnosis not present

## 2014-09-14 DIAGNOSIS — K8050 Calculus of bile duct without cholangitis or cholecystitis without obstruction: Secondary | ICD-10-CM | POA: Diagnosis not present

## 2014-09-14 DIAGNOSIS — K8020 Calculus of gallbladder without cholecystitis without obstruction: Secondary | ICD-10-CM | POA: Diagnosis not present

## 2014-09-14 DIAGNOSIS — R531 Weakness: Secondary | ICD-10-CM | POA: Diagnosis not present

## 2014-09-14 DIAGNOSIS — R1011 Right upper quadrant pain: Secondary | ICD-10-CM | POA: Diagnosis not present

## 2014-09-14 HISTORY — DX: Anxiety disorder, unspecified: F41.9

## 2014-09-14 LAB — COMPREHENSIVE METABOLIC PANEL
ALT: 47 U/L (ref 14–54)
ANION GAP: 10 (ref 5–15)
AST: 26 U/L (ref 15–41)
Albumin: 3.8 g/dL (ref 3.5–5.0)
Alkaline Phosphatase: 102 U/L (ref 38–126)
BUN: 18 mg/dL (ref 6–20)
CALCIUM: 9.7 mg/dL (ref 8.9–10.3)
CHLORIDE: 102 mmol/L (ref 101–111)
CO2: 29 mmol/L (ref 22–32)
Creatinine, Ser: 0.8 mg/dL (ref 0.44–1.00)
GFR calc Af Amer: >60 mL/min (ref >60)
Glucose, Bld: 91 mg/dL (ref 70–99)
Potassium: 4.6 mmol/L (ref 3.5–5.1)
Sodium: 141 mmol/L (ref 135–145)
Total Bilirubin: 0.9 mg/dL (ref 0.3–1.2)
Total Protein: 7.7 g/dL (ref 6.5–8.1)

## 2014-09-14 LAB — CBC WITH DIFFERENTIAL/PLATELET
Basophils Absolute: 0 10*3/uL (ref 0.0–0.1)
Basophils Relative: 0 % (ref 0–1)
EOS PCT: 2 % (ref 0–5)
Eosinophils Absolute: 0.2 10*3/uL (ref 0.0–0.7)
HCT: 43.5 % (ref 36.0–46.0)
Hemoglobin: 14.2 g/dL (ref 12.0–15.0)
LYMPHS ABS: 3.8 10*3/uL (ref 0.7–4.0)
LYMPHS PCT: 28 % (ref 12–46)
MCH: 28.3 pg (ref 26.0–34.0)
MCHC: 32.6 g/dL (ref 30.0–36.0)
MCV: 86.8 fL (ref 78.0–100.0)
MONO ABS: 1 10*3/uL (ref 0.1–1.0)
Monocytes Relative: 8 % (ref 3–12)
NEUTROS PCT: 62 % (ref 43–77)
Neutro Abs: 8.4 10*3/uL — ABNORMAL HIGH (ref 1.7–7.7)
PLATELETS: 182 10*3/uL (ref 150–400)
RBC: 5.01 MIL/uL (ref 3.87–5.11)
RDW: 14.4 % (ref 11.5–15.5)
WBC: 13.4 10*3/uL — AB (ref 4.0–10.5)

## 2014-09-14 NOTE — Progress Notes (Addendum)
Patient denies any Shortness of breath or chest pain.  Patient was seen by Dr Warren Lacy in Sept with chest discomfort and had a normal Stress test. "They said pain was related to my gallbladder."

## 2014-09-15 MED ORDER — CEFAZOLIN SODIUM-DEXTROSE 2-3 GM-% IV SOLR
2.0000 g | INTRAVENOUS | Status: AC
  Administered 2014-09-16: 2 g via INTRAVENOUS
  Filled 2014-09-15: qty 50

## 2014-09-16 ENCOUNTER — Ambulatory Visit (HOSPITAL_COMMUNITY): Payer: Medicare Other | Admitting: Anesthesiology

## 2014-09-16 ENCOUNTER — Inpatient Hospital Stay (HOSPITAL_COMMUNITY)
Admission: RE | Admit: 2014-09-16 | Discharge: 2014-09-20 | DRG: 418 | Disposition: A | Discharge status: Home / Self Care | Payer: Medicare Other | Source: Ambulatory Visit | Attending: General Surgery | Admitting: General Surgery

## 2014-09-16 ENCOUNTER — Encounter (HOSPITAL_COMMUNITY): Payer: Self-pay | Admitting: *Deleted

## 2014-09-16 ENCOUNTER — Encounter (HOSPITAL_COMMUNITY)
Admission: RE | Disposition: A | Discharge status: Home / Self Care | Payer: Self-pay | Source: Ambulatory Visit | Attending: General Surgery

## 2014-09-16 DIAGNOSIS — K913 Postprocedural intestinal obstruction: Secondary | ICD-10-CM | POA: Diagnosis not present

## 2014-09-16 DIAGNOSIS — Z90710 Acquired absence of both cervix and uterus: Secondary | ICD-10-CM

## 2014-09-16 DIAGNOSIS — Z886 Allergy status to analgesic agent status: Secondary | ICD-10-CM

## 2014-09-16 DIAGNOSIS — K589 Irritable bowel syndrome without diarrhea: Secondary | ICD-10-CM | POA: Diagnosis not present

## 2014-09-16 DIAGNOSIS — E876 Hypokalemia: Secondary | ICD-10-CM | POA: Diagnosis not present

## 2014-09-16 DIAGNOSIS — K219 Gastro-esophageal reflux disease without esophagitis: Secondary | ICD-10-CM | POA: Diagnosis not present

## 2014-09-16 DIAGNOSIS — Z9049 Acquired absence of other specified parts of digestive tract: Secondary | ICD-10-CM

## 2014-09-16 DIAGNOSIS — R531 Weakness: Secondary | ICD-10-CM | POA: Diagnosis not present

## 2014-09-16 DIAGNOSIS — Z7952 Long term (current) use of systemic steroids: Secondary | ICD-10-CM

## 2014-09-16 DIAGNOSIS — Z79899 Other long term (current) drug therapy: Secondary | ICD-10-CM

## 2014-09-16 DIAGNOSIS — K8020 Calculus of gallbladder without cholecystitis without obstruction: Principal | ICD-10-CM | POA: Diagnosis present

## 2014-09-16 DIAGNOSIS — Z888 Allergy status to other drugs, medicaments and biological substances status: Secondary | ICD-10-CM

## 2014-09-16 DIAGNOSIS — D693 Immune thrombocytopenic purpura: Secondary | ICD-10-CM | POA: Diagnosis present

## 2014-09-16 DIAGNOSIS — K8050 Calculus of bile duct without cholangitis or cholecystitis without obstruction: Secondary | ICD-10-CM | POA: Diagnosis not present

## 2014-09-16 DIAGNOSIS — Z91041 Radiographic dye allergy status: Secondary | ICD-10-CM

## 2014-09-16 DIAGNOSIS — K8010 Calculus of gallbladder with chronic cholecystitis without obstruction: Secondary | ICD-10-CM | POA: Diagnosis not present

## 2014-09-16 DIAGNOSIS — R1115 Cyclical vomiting syndrome unrelated to migraine: Secondary | ICD-10-CM

## 2014-09-16 DIAGNOSIS — I2510 Atherosclerotic heart disease of native coronary artery without angina pectoris: Secondary | ICD-10-CM | POA: Diagnosis not present

## 2014-09-16 HISTORY — PX: CHOLECYSTECTOMY: SHX55

## 2014-09-16 SURGERY — LAPAROSCOPIC CHOLECYSTECTOMY WITH INTRAOPERATIVE CHOLANGIOGRAM
Anesthesia: General

## 2014-09-16 MED ORDER — EPHEDRINE SULFATE 50 MG/ML IJ SOLN
INTRAMUSCULAR | Status: DC | PRN
  Administered 2014-09-16 (×2): 10 mg via INTRAVENOUS

## 2014-09-16 MED ORDER — ESMOLOL HCL 10 MG/ML IV SOLN
INTRAVENOUS | Status: AC
  Filled 2014-09-16: qty 10

## 2014-09-16 MED ORDER — SODIUM CHLORIDE 0.9 % IV SOLN
INTRAVENOUS | Status: DC
  Administered 2014-09-16 – 2014-09-17 (×2): via INTRAVENOUS
  Administered 2014-09-18: 1 mL via INTRAVENOUS
  Administered 2014-09-18 – 2014-09-20 (×2): via INTRAVENOUS

## 2014-09-16 MED ORDER — HYDROMORPHONE HCL 1 MG/ML IJ SOLN
INTRAMUSCULAR | Status: AC
  Filled 2014-09-16: qty 1

## 2014-09-16 MED ORDER — LACTATED RINGERS IV SOLN
INTRAVENOUS | Status: DC | PRN
  Administered 2014-09-16 (×2): via INTRAVENOUS

## 2014-09-16 MED ORDER — GLYCOPYRROLATE 0.2 MG/ML IJ SOLN
INTRAMUSCULAR | Status: DC | PRN
  Administered 2014-09-16: 0.6 mg via INTRAVENOUS
  Administered 2014-09-16: 0.2 mg via INTRAVENOUS

## 2014-09-16 MED ORDER — GLYCOPYRROLATE 0.2 MG/ML IJ SOLN
INTRAMUSCULAR | Status: AC
  Filled 2014-09-16: qty 2

## 2014-09-16 MED ORDER — DORZOLAMIDE HCL-TIMOLOL MAL 2-0.5 % OP SOLN
1.0000 [drp] | Freq: Two times a day (BID) | OPHTHALMIC | Status: DC
  Administered 2014-09-16 – 2014-09-20 (×8): 1 [drp] via OPHTHALMIC
  Filled 2014-09-16: qty 10

## 2014-09-16 MED ORDER — PANTOPRAZOLE SODIUM 20 MG PO TBEC
20.0000 mg | DELAYED_RELEASE_TABLET | Freq: Every day | ORAL | Status: DC
  Administered 2014-09-16 – 2014-09-18 (×3): 20 mg via ORAL
  Filled 2014-09-16 (×4): qty 1

## 2014-09-16 MED ORDER — ONDANSETRON HCL 4 MG/2ML IJ SOLN
4.0000 mg | Freq: Four times a day (QID) | INTRAMUSCULAR | Status: DC | PRN
  Administered 2014-09-17 – 2014-09-18 (×4): 4 mg via INTRAVENOUS
  Filled 2014-09-16 (×4): qty 2

## 2014-09-16 MED ORDER — DEXAMETHASONE SODIUM PHOSPHATE 10 MG/ML IJ SOLN
INTRAMUSCULAR | Status: AC
  Filled 2014-09-16: qty 1

## 2014-09-16 MED ORDER — MORPHINE SULFATE 2 MG/ML IJ SOLN
1.0000 mg | INTRAMUSCULAR | Status: DC | PRN
  Administered 2014-09-17 – 2014-09-18 (×3): 1 mg via INTRAVENOUS
  Filled 2014-09-16 (×3): qty 1

## 2014-09-16 MED ORDER — HYDROCHLOROTHIAZIDE 12.5 MG PO CAPS
12.5000 mg | ORAL_CAPSULE | Freq: Every day | ORAL | Status: DC
  Administered 2014-09-18 – 2014-09-20 (×3): 12.5 mg via ORAL
  Filled 2014-09-16 (×4): qty 1

## 2014-09-16 MED ORDER — SODIUM CHLORIDE 0.9 % IR SOLN
Status: DC | PRN
  Administered 2014-09-16: 1000 mL

## 2014-09-16 MED ORDER — OXYCODONE-ACETAMINOPHEN 10-325 MG PO TABS: 1.0000 | ORAL_TABLET | Freq: Four times a day (QID) | ORAL | Status: DC | PRN

## 2014-09-16 MED ORDER — ACETAMINOPHEN 650 MG RE SUPP: 650.0000 mg | Freq: Four times a day (QID) | RECTAL | Status: DC | PRN

## 2014-09-16 MED ORDER — NEOSTIGMINE METHYLSULFATE 10 MG/10ML IV SOLN
INTRAVENOUS | Status: AC
  Filled 2014-09-16: qty 1

## 2014-09-16 MED ORDER — OXYCODONE HCL 5 MG PO TABS
5.0000 mg | ORAL_TABLET | ORAL | Status: DC | PRN
  Administered 2014-09-16 (×2): 5 mg via ORAL
  Filled 2014-09-16: qty 1

## 2014-09-16 MED ORDER — DEXAMETHASONE SODIUM PHOSPHATE 10 MG/ML IJ SOLN
INTRAMUSCULAR | Status: DC | PRN
  Administered 2014-09-16: 10 mg via INTRAVENOUS

## 2014-09-16 MED ORDER — TRAMADOL HCL 50 MG PO TABS
50.0000 mg | ORAL_TABLET | Freq: Four times a day (QID) | ORAL | Status: DC | PRN
  Administered 2014-09-17: 50 mg via ORAL
  Filled 2014-09-16: qty 1

## 2014-09-16 MED ORDER — ACETAMINOPHEN 325 MG PO TABS: 650.0000 mg | ORAL_TABLET | Freq: Four times a day (QID) | ORAL | Status: DC | PRN

## 2014-09-16 MED ORDER — ONDANSETRON HCL 4 MG/2ML IJ SOLN
INTRAMUSCULAR | Status: DC | PRN
  Administered 2014-09-16: 4 mg via INTRAVENOUS

## 2014-09-16 MED ORDER — OXYCODONE HCL 5 MG PO TABS
ORAL_TABLET | ORAL | Status: AC
  Filled 2014-09-16: qty 1

## 2014-09-16 MED ORDER — LIDOCAINE HCL (CARDIAC) 20 MG/ML IV SOLN
INTRAVENOUS | Status: DC | PRN
  Administered 2014-09-16: 40 mg via INTRAVENOUS

## 2014-09-16 MED ORDER — PROPOFOL 10 MG/ML IV BOLUS
INTRAVENOUS | Status: DC | PRN
  Administered 2014-09-16: 100 mg via INTRAVENOUS

## 2014-09-16 MED ORDER — LACTATED RINGERS IV SOLN
INTRAVENOUS | Status: DC
  Administered 2014-09-16: 12:00:00 via INTRAVENOUS

## 2014-09-16 MED ORDER — HYDROMORPHONE HCL 1 MG/ML IJ SOLN
0.2500 mg | INTRAMUSCULAR | Status: DC | PRN
  Administered 2014-09-16 (×4): 0.5 mg via INTRAVENOUS

## 2014-09-16 MED ORDER — ESMOLOL HCL 10 MG/ML IV SOLN
INTRAVENOUS | Status: DC | PRN
  Administered 2014-09-16: 10 mg via INTRAVENOUS

## 2014-09-16 MED ORDER — HEPARIN SODIUM (PORCINE) 5000 UNIT/ML IJ SOLN
5000.0000 [IU] | Freq: Three times a day (TID) | INTRAMUSCULAR | Status: DC
  Administered 2014-09-17 – 2014-09-20 (×10): 5000 [IU] via SUBCUTANEOUS
  Filled 2014-09-16 (×10): qty 1

## 2014-09-16 MED ORDER — BUPIVACAINE-EPINEPHRINE 0.25% -1:200000 IJ SOLN
INTRAMUSCULAR | Status: DC | PRN
  Administered 2014-09-16: 15 mL

## 2014-09-16 MED ORDER — BUPIVACAINE-EPINEPHRINE (PF) 0.25% -1:200000 IJ SOLN
INTRAMUSCULAR | Status: AC
  Filled 2014-09-16: qty 30

## 2014-09-16 MED ORDER — FENTANYL CITRATE (PF) 100 MCG/2ML IJ SOLN
INTRAMUSCULAR | Status: DC | PRN
  Administered 2014-09-16 (×5): 50 ug via INTRAVENOUS

## 2014-09-16 MED ORDER — GLYCOPYRROLATE 0.2 MG/ML IJ SOLN
INTRAMUSCULAR | Status: AC
  Filled 2014-09-16: qty 3

## 2014-09-16 MED ORDER — ALPRAZOLAM 0.25 MG PO TABS
0.2500 mg | ORAL_TABLET | Freq: Two times a day (BID) | ORAL | Status: DC | PRN
  Administered 2014-09-17: 0.25 mg via ORAL
  Filled 2014-09-16: qty 1

## 2014-09-16 MED ORDER — NEOSTIGMINE METHYLSULFATE 10 MG/10ML IV SOLN
INTRAVENOUS | Status: DC | PRN
Start: 2014-09-16 — End: 2014-09-16
  Administered 2014-09-16: 4 mg via INTRAVENOUS

## 2014-09-16 MED ORDER — 0.9 % SODIUM CHLORIDE (POUR BTL) OPTIME
TOPICAL | Status: DC | PRN
Start: 2014-09-16 — End: 2014-09-16
  Administered 2014-09-16: 1000 mL

## 2014-09-16 MED ORDER — ROCURONIUM BROMIDE 100 MG/10ML IV SOLN
INTRAVENOUS | Status: DC | PRN
  Administered 2014-09-16: 40 mg via INTRAVENOUS

## 2014-09-16 MED ORDER — FENTANYL CITRATE (PF) 250 MCG/5ML IJ SOLN
INTRAMUSCULAR | Status: AC
  Filled 2014-09-16: qty 5

## 2014-09-16 MED ORDER — PREDNISONE 5 MG PO TABS
2.5000 mg | ORAL_TABLET | Freq: Every day | ORAL | Status: DC
  Administered 2014-09-17 – 2014-09-20 (×4): 2.5 mg via ORAL
  Filled 2014-09-16 (×6): qty 1

## 2014-09-16 SURGICAL SUPPLY — 44 items
APPLIER CLIP 5 13 M/L LIGAMAX5 (MISCELLANEOUS) ×2
BLADE SURG ROTATE 9660 (MISCELLANEOUS) IMPLANT
CANISTER SUCTION 2500CC (MISCELLANEOUS) ×2 IMPLANT
CHLORAPREP W/TINT 26ML (MISCELLANEOUS) ×2 IMPLANT
CLIP APPLIE 5 13 M/L LIGAMAX5 (MISCELLANEOUS) ×1 IMPLANT
COVER MAYO STAND STRL (DRAPES) ×2 IMPLANT
COVER SURGICAL LIGHT HANDLE (MISCELLANEOUS) ×2 IMPLANT
DEVICE TROCAR PUNCTURE CLOSURE (ENDOMECHANICALS) ×4 IMPLANT
DRAPE C-ARM 42X72 X-RAY (DRAPES) ×2 IMPLANT
DRAPE LAPAROSCOPIC ABDOMINAL (DRAPES) ×2 IMPLANT
ELECT REM PT RETURN 9FT ADLT (ELECTROSURGICAL) ×2
ELECTRODE REM PT RTRN 9FT ADLT (ELECTROSURGICAL) ×1 IMPLANT
GLOVE BIO SURGEON STRL SZ7 (GLOVE) ×2 IMPLANT
GLOVE BIOGEL PI IND STRL 6.5 (GLOVE) ×2 IMPLANT
GLOVE BIOGEL PI IND STRL 7.5 (GLOVE) ×1 IMPLANT
GLOVE BIOGEL PI INDICATOR 6.5 (GLOVE) ×2
GLOVE BIOGEL PI INDICATOR 7.5 (GLOVE) ×1
GLOVE SURG SS PI 6.0 STRL IVOR (GLOVE) ×2 IMPLANT
GLOVE SURG SS PI 6.5 STRL IVOR (GLOVE) ×2 IMPLANT
GOWN STRL REUS W/ TWL LRG LVL3 (GOWN DISPOSABLE) ×3 IMPLANT
GOWN STRL REUS W/TWL LRG LVL3 (GOWN DISPOSABLE) ×3
KIT BASIN OR (CUSTOM PROCEDURE TRAY) ×2 IMPLANT
KIT ROOM TURNOVER OR (KITS) ×2 IMPLANT
LIQUID BAND (GAUZE/BANDAGES/DRESSINGS) ×2 IMPLANT
NS IRRIG 1000ML POUR BTL (IV SOLUTION) ×2 IMPLANT
PAD ARMBOARD 7.5X6 YLW CONV (MISCELLANEOUS) ×2 IMPLANT
POUCH RETRIEVAL ECOSAC 10 (ENDOMECHANICALS) ×1 IMPLANT
POUCH RETRIEVAL ECOSAC 10MM (ENDOMECHANICALS) ×1
SCISSORS LAP 5X35 DISP (ENDOMECHANICALS) ×2 IMPLANT
SET CHOLANGIOGRAPH 5 50 .035 (SET/KITS/TRAYS/PACK) ×2 IMPLANT
SET IRRIG TUBING LAPAROSCOPIC (IRRIGATION / IRRIGATOR) ×2 IMPLANT
SLEEVE ENDOPATH XCEL 5M (ENDOMECHANICALS) ×4 IMPLANT
SPECIMEN JAR SMALL (MISCELLANEOUS) ×2 IMPLANT
STRIP CLOSURE SKIN 1/2X4 (GAUZE/BANDAGES/DRESSINGS) IMPLANT
SUT MNCRL AB 4-0 PS2 18 (SUTURE) ×2 IMPLANT
SUT VICRYL 0 UR6 27IN ABS (SUTURE) ×8 IMPLANT
TOWEL OR 17X24 6PK STRL BLUE (TOWEL DISPOSABLE) ×2 IMPLANT
TOWEL OR 17X26 10 PK STRL BLUE (TOWEL DISPOSABLE) ×2 IMPLANT
TRAY LAPAROSCOPIC (CUSTOM PROCEDURE TRAY) ×2 IMPLANT
TROCAR BLADELESS 11MM (ENDOMECHANICALS) ×2 IMPLANT
TROCAR BLADELESS OPT 5 150 (ENDOMECHANICALS) ×2 IMPLANT
TROCAR XCEL BLUNT TIP 100MML (ENDOMECHANICALS) ×2 IMPLANT
TROCAR XCEL NON-BLD 5MMX100MML (ENDOMECHANICALS) ×2 IMPLANT
TUBING INSUFFLATION (TUBING) ×2 IMPLANT

## 2014-09-16 NOTE — Transfer of Care (Signed)
Immediate Anesthesia Transfer of Care Note  Patient: Tamara Robertson  Procedure(s) Performed: Procedure(s): LAPAROSCOPIC CHOLECYSTECTOMY  (N/A)  Patient Location: PACU  Anesthesia Type:General  Level of Consciousness: awake, alert  and oriented  Airway & Oxygen Therapy: Patient Spontanous Breathing and Patient connected to nasal cannula oxygen  Post-op Assessment: Report given to RN and Post -op Vital signs reviewed and stable  Post vital signs: Reviewed and stable  Last Vitals:  Filed Vitals:   09/16/14 1502  BP:   Pulse:   Temp: 36.4 C  Resp:     Complications: No apparent anesthesia complications

## 2014-09-16 NOTE — Op Note (Signed)
Preoperative diagnosis: symptomatic cholelithiasis Postoperative diagnosis: same as above Procedure: laparoscopic cholecystectomy, lysis of adhesions Surgeon: Dr Serita Grammes Anesthesia: general EBL: minimal Drains none Specimen gb and contents to pathology Complications: none Sponge count correct at completion Disposition to recovery stable  Indications: This is a 44 yof with symptoms that appear to be related to her gallbladder and cholelithiasis on her ultrasound We discussed proceeding with her laparoscopic cholecystectomy.  Procedure: After informed consent was obtained the patient was taken to the operating room. She was given antibiotics. Sequential compression devices were on her legs. She was placed under general anesthesia without complication. Her abdomen was prepped and draped in the standard sterile surgical fashion. A surgical timeout was then performed.  I infiltrated marcaine in the right upper quadrant due to her multiple incisions.  I then entered into the peritoneum bluntly under direct vision.  i then inserted a 5 mm balloon tipped trocar and insufflated the abdomen to 15 mm Hg pressure.  I then inserted a rlq 5 mm trocar under direct vision. She had adhesions on the abdominal wall that I took down with a combination of blunt dissection and scissors.  Once I had freed this I then inserted a 111 mm trocar above the umbilicus. I then inserted another 5 mm trocar in the epigastrium and ruq.  I then removed the balloon tipped trocar and closed this with multiple 0 vicryl sutures using the endoclose device. I then was able to retract the gallbladder cephalad and lateral.  I then was able to identify the cystic duct and clearly had the critical view of safety.I then clipped the cystic duct and divided it. The duct was viable and the clips traversed the duct. I then treated the artery in similar fashion as it was immediately adjacent to the duct. I then removed the gallbladder  from the liver bed and placed it in a bag. It was then removed from the umbilical incision. I then obtained hemostasis and irrigated.I then removed the umbilical trocar and closed with 0 vicryl and the endoclose device. I closed my initial site anteriorly with 0 vicryl suture also.  I then desufflated the abdomen and removed all my remaining trocars. I then close these with 4-0 Monocryl and Dermabond. She tolerated this well was extubated and transferred to the recovery room in stable condition.

## 2014-09-16 NOTE — Interval H&P Note (Signed)
History and Physical Interval Note:  09/16/2014 12:56 PM  Tamara Robertson  has presented today for surgery, with the diagnosis of BILIARY COLIC  The various methods of treatment have been discussed with the patient and family. After consideration of risks, benefits and other options for treatment, the patient has consented to  Procedure(s): LAPAROSCOPIC CHOLECYSTECTOMY WITH INTRAOPERATIVE CHOLANGIOGRAM (N/A) as a surgical intervention .  The patient's history has been reviewed, patient examined, no change in status, stable for surgery.  I have reviewed the patient's chart and labs.  Questions were answered to the patient's satisfaction.     Tamara Robertson

## 2014-09-16 NOTE — Anesthesia Procedure Notes (Signed)
Procedure Name: Intubation Date/Time: 09/16/2014 1:13 PM Performed by: Susa Loffler Pre-anesthesia Checklist: Patient identified, Emergency Drugs available, Suction available, Patient being monitored and Timeout performed Patient Re-evaluated:Patient Re-evaluated prior to inductionOxygen Delivery Method: Circle system utilized Preoxygenation: Pre-oxygenation with 100% oxygen Intubation Type: IV induction Ventilation: Mask ventilation without difficulty and Oral airway inserted - appropriate to patient size Laryngoscope Size: Mac and 3 Grade View: Grade I Tube type: Oral Tube size: 7.5 mm Number of attempts: 1 Airway Equipment and Method: Stylet and Oral airway Placement Confirmation: ETT inserted through vocal cords under direct vision,  positive ETCO2 and breath sounds checked- equal and bilateral Secured at: 21 cm Tube secured with: Tape Dental Injury: Teeth and Oropharynx as per pre-operative assessment

## 2014-09-16 NOTE — Discharge Instructions (Signed)
[facility] LAPAROSCOPIC SURGERY: POST OP INSTRUCTIONS  Always review your discharge instruction sheet given to you by the facility where your surgery was performed. IF YOU HAVE DISABILITY OR FAMILY LEAVE FORMS, YOU MUST BRING THEM TO THE OFFICE FOR PROCESSING.   DO NOT GIVE THEM TO YOUR DOCTOR.  1. A prescription for pain medication may be given to you upon discharge.  Take your pain medication as prescribed, if needed.  If narcotic pain medicine is not needed, then you may take acetaminophen (Tylenol), naprosyn (Alleve), or ibuprofen (Advil) as needed. 2. Take your usually prescribed medications unless otherwise directed. 3. If you need a refill on your pain medication, please contact your pharmacy.  They will contact our office to request authorization. Prescriptions will not be filled after 5pm or on week-ends. 4. You should follow a light diet the first few days after arrival home, such as soup and crackers, etc.  Be sure to include lots of fluids daily. 5. Most patients will experience some swelling and bruising in the area of the incisions.  Ice packs will help.  Swelling and bruising can take several days to resolve.  6. It is common to experience some constipation if taking pain medication after surgery.  Increasing fluid intake and taking a stool softener (such as Colace) will usually help or prevent this problem from occurring.  A mild laxative (Milk of Magnesia or Miralax) should be taken according to package instructions if there are no bowel movements after 48 hours. 7. Unless discharge instructions indicate otherwise, you may remove your bandages 48 hours after surgery, and you may shower at that time.  You may have steri-strips (small skin tapes) in place directly over the incision.  These strips should be left on the skin for 7-10 days.  If your surgeon used skin glue on the incision, you may shower in 24 hours.  The glue will flake  off over the next 2-3 weeks.  Any sutures or staples will be removed at the office during your follow-up visit. 8. ACTIVITIES:  You may resume regular (light) daily activities beginning the next day--such as daily self-care, walking, climbing stairs--gradually increasing activities as tolerated.  You may have sexual intercourse when it is comfortable.  Refrain from any heavy lifting or straining until approved by your doctor. a. You may drive when you are no longer taking prescription pain medication, you can comfortably wear a seatbelt, and you can safely maneuver your car and apply brakes. b. RETURN TO WORK:  __________________________________________________________ 9. You should see your doctor in the office for a follow-up appointment approximately 2-3 weeks after your surgery.  Make sure that you call for this appointment within a day or two after you arrive home to insure a convenient appointment time. 10. OTHER INSTRUCTIONS: __________________________________________________________________________________________________________________________ __________________________________________________________________________________________________________________________ WHEN TO CALL YOUR DOCTOR: 1. Fever over 101.0 2. Inability to urinate 3. Continued bleeding from incision. 4. Increased pain, redness, or drainage from the incision. 5. Increasing abdominal pain  The clinic staff is available to answer your questions during regular business hours.  Please don't hesitate to call and ask to speak to one of the nurses for clinical concerns.  If you have a medical emergency, go to the nearest emergency room or call 911.  A surgeon from [facility] is always on call at the hospital. [address], [address], [place], [place]  [address] ? [address] [address], [place], [place]   [address] [phone] ? [phone] ? [phone] Web site: [email or web address]  

## 2014-09-16 NOTE — H&P (Signed)
  85 yof who has several months at least of ruq pain that radiates to back and chest. She underwent US that showed cholelithiasis. MRCP also shows the same. also has episode of some increased transminases associated with this as well. She has no issues with bms. Still continues to have this pain intermittently. She has been seen before here and would like second opinion due to persistent symptoms. She does have history of sigmoid colectomy for diveriticular disease, tah, open spleen for itp. still follow up with Dr Alen Blew for itp and is on some intermittent steroids. She still continues to have some abdominal wall pain in epigastric region that does not appear to have hernia.   Allergies  Aspirin *ANALGESICS - NonNarcotic* Contrast Media Ready-Box *MEDICAL DEVICES AND SUPPLIES* Statins Depletion *DIETARY PRODUCTS/DIETARY MANAGEMENT PRODUCTS*  Medication History  ALPRAZolam (0.25MG  Tablet, Oral) Active. Baclofen (10MG  Tablet, Oral) Active. Dorzolamide HCl-Timolol Mal (22.3-6.8MG /ML Solution, Ophthalmic) Active. Hydrochlorothiazide (12.5MG  Capsule, Oral) Active. Pantoprazole Sodium (20MG  Tablet DR, Oral) Active. Biotin (2500MCG Capsule, Oral) Active. Calcium Citrate Active. Calcium Magnesium (500-100-500 Liquid, Oral) Active. Flaxseed (Linseed) (Oral) Active. Magnesium (250MG  Tablet, Oral) Active. Multivitamins (Oral) Active. Nystatin (100000 UNIT/GM Powder, External) Active. Protonix (20MG  Tablet DR, Oral) Active. MiraLax (Oral) Active. Deltasone (2.5MG  Tablet, Oral) Active. Medications Reconciled  Vitals  08/16/2014 2:53 PM Weight: 149 lb Height: 62in Body Surface Area: 1.72 m Body Mass Index: 27.25 kg/m Pulse: 83 (Regular)  Resp.: 17 (Unlabored)  BP: 148/80 (Sitting, Left Arm, Standard)    Physical Exam  General Mental Status-Alert. Orientation-Oriented X3.  Eye Sclera/Conjunctiva - Bilateral-Sclera icteric.  Chest and Lung  Exam Chest and lung exam reveals -on auscultation, normal breath sounds, no adventitious sounds and normal vocal resonance.  Cardiovascular Cardiovascular examination reveals -normal heart sounds, regular rate and rhythm with no murmurs.  Abdomen Note: soft nt/nd no hernias palpated    Lymphatic Head & Neck  General Head & Neck Lymphatics: Bilateral - Description - Normal.    Assessment & Plan  GALLSTONES (574.20  K80.20) Story: I do think she has symptomatic gallstones and discussed cholecystectomy today for reason of symptoms as well as possible other future issues like pancreatitis which we discussed today. I think attempt at laparoscopy still possible with prior surgery and will plan this way. I discussed the procedure in detail. We discussed the risks and benefits of a laparoscopic cholecystectomy and possible cholangiogram including, but not limited to bleeding, infection, injury to surrounding structures such as the intestine or liver, bile leak, retained gallstones, need to convert to an open procedure, prolonged diarrhea, blood clots such as DVT, common bile duct injury, anesthesia risks, and possible need for additional procedures. The likelihood of improvement in symptoms and return to the patient's normal status is good. We discussed the typical post-operative recovery course.

## 2014-09-16 NOTE — Anesthesia Preprocedure Evaluation (Addendum)
Anesthesia Evaluation  Patient identified by MRN, date of birth, ID band Patient awake    Reviewed: Allergy & Precautions, H&P , NPO status , Patient's Chart, lab work & pertinent test results  Airway Mallampati: II  TM Distance: >3 FB Neck ROM: Full    Dental no notable dental hx. (+) Teeth Intact, Dental Advisory Given   Pulmonary neg pulmonary ROS,  breath sounds clear to auscultation  Pulmonary exam normal       Cardiovascular + CAD and + Peripheral Vascular Disease Rhythm:Regular Rate:Normal     Neuro/Psych Anxiety negative neurological ROS  negative psych ROS   GI/Hepatic Neg liver ROS, GERD-  Medicated,  Endo/Other  negative endocrine ROS  Renal/GU negative Renal ROS  negative genitourinary   Musculoskeletal  (+) Arthritis -, Osteoarthritis,    Abdominal   Peds  Hematology negative hematology ROS (+)   Anesthesia Other Findings   Reproductive/Obstetrics negative OB ROS                            Anesthesia Physical Anesthesia Plan  ASA: III  Anesthesia Plan: General   Post-op Pain Management:    Induction: Intravenous  Airway Management Planned: Oral ETT  Additional Equipment:   Intra-op Plan:   Post-operative Plan: Extubation in OR  Informed Consent: I have reviewed the patients History and Physical, chart, labs and discussed the procedure including the risks, benefits and alternatives for the proposed anesthesia with the patient or authorized representative who has indicated his/her understanding and acceptance.   Dental advisory given  Plan Discussed with: CRNA  Anesthesia Plan Comments:         Anesthesia Quick Evaluation

## 2014-09-17 LAB — BASIC METABOLIC PANEL
ANION GAP: 8 (ref 5–15)
BUN: 11 mg/dL (ref 6–20)
CALCIUM: 8.4 mg/dL — AB (ref 8.9–10.3)
CO2: 26 mmol/L (ref 22–32)
Chloride: 104 mmol/L (ref 101–111)
Creatinine, Ser: 0.73 mg/dL (ref 0.44–1.00)
GFR calc non Af Amer: >60 mL/min (ref >60)
GLUCOSE: 138 mg/dL — AB (ref 65–99)
POTASSIUM: 4.2 mmol/L (ref 3.5–5.1)
Sodium: 138 mmol/L (ref 135–145)

## 2014-09-17 LAB — CBC
HCT: 41.8 % (ref 36.0–46.0)
Hemoglobin: 13.7 g/dL (ref 12.0–15.0)
MCH: 28 pg (ref 26.0–34.0)
MCHC: 32.8 g/dL (ref 30.0–36.0)
MCV: 85.3 fL (ref 78.0–100.0)
PLATELETS: 208 10*3/uL (ref 150–400)
RBC: 4.9 MIL/uL (ref 3.87–5.11)
RDW: 14.1 % (ref 11.5–15.5)
WBC: 16.1 10*3/uL — AB (ref 4.0–10.5)

## 2014-09-17 MED ORDER — METHOCARBAMOL 500 MG PO TABS
500.0000 mg | ORAL_TABLET | Freq: Three times a day (TID) | ORAL | Status: DC | PRN
  Administered 2014-09-17: 500 mg via ORAL
  Filled 2014-09-17 (×2): qty 1

## 2014-09-17 MED ORDER — SCOPOLAMINE 1 MG/3DAYS TD PT72
1.0000 | MEDICATED_PATCH | TRANSDERMAL | Status: DC
  Administered 2014-09-17: 1.5 mg via TRANSDERMAL
  Filled 2014-09-17 (×2): qty 1

## 2014-09-17 MED ORDER — SODIUM CHLORIDE 0.9 % IV BOLUS (SEPSIS)
500.0000 mL | Freq: Once | INTRAVENOUS | Status: AC
  Administered 2014-09-17: 500 mL via INTRAVENOUS

## 2014-09-17 MED ORDER — PROMETHAZINE HCL 25 MG/ML IJ SOLN
12.5000 mg | Freq: Three times a day (TID) | INTRAMUSCULAR | Status: DC | PRN
  Administered 2014-09-17 – 2014-09-18 (×3): 12.5 mg via INTRAVENOUS
  Filled 2014-09-17 (×3): qty 1

## 2014-09-17 NOTE — Anesthesia Postprocedure Evaluation (Signed)
  Anesthesia Post-op Note  Patient: Tamara Robertson  Procedure(s) Performed: Procedure(s): LAPAROSCOPIC CHOLECYSTECTOMY  (N/A)  Patient Location: PACU  Anesthesia Type:General  Level of Consciousness: awake and alert   Airway and Oxygen Therapy: Patient Spontanous Breathing  Post-op Pain: none  Post-op Assessment: Post-op Vital signs reviewed, Patient's Cardiovascular Status Stable and Respiratory Function Stable  Post-op Vital Signs: Reviewed  Filed Vitals:   09/17/14 1335  BP: 168/58  Pulse: 71  Temp: 36.7 C  Resp: 16    Complications: No apparent anesthesia complications

## 2014-09-17 NOTE — Progress Notes (Signed)
1 Day Post-Op  Subjective: Feeling faint and nauseated this morning Denies chest pain and SOB  Objective: Vital signs in last 24 hours: Temp:  [97.5 F (36.4 C)-97.9 F (36.6 C)] 97.8 F (36.6 C) (05/13 0550) Pulse Rate:  [48-91] 91 (05/13 0550) Resp:  [4-20] 16 (05/13 0550) BP: (133-171)/(44-67) 161/66 mmHg (05/13 0550) SpO2:  [98 %-100 %] 98 % (05/13 0550) Weight:  [67.132 kg (148 lb)] 67.132 kg (148 lb) (05/12 1116)    Intake/Output from previous day: 05/12 0701 - 05/13 0700 In: 3225 [P.O.:760; I.V.:2465] Out: 400 [Urine:400] Intake/Output this shift:    Weak appearing Lungs clear CV mildly tachy Abdomen soft  Lab Results:   Recent Labs  09/14/14 1230 09/17/14 0252  WBC 13.4* 16.1*  HGB 14.2 13.7  HCT 43.5 41.8  PLT 182 208   BMET  Recent Labs  09/14/14 1230  NA 141  K 4.6  CL 102  CO2 29  GLUCOSE 91  BUN 18  CREATININE 0.80  CALCIUM 9.7   PT/INR No results for input(s): LABPROT, INR in the last 72 hours. ABG No results for input(s): PHART, HCO3 in the last 72 hours.  Invalid input(s): PCO2, PO2  Studies/Results: No results found.  Anti-infectives: Anti-infectives    Start     Dose/Rate Route Frequency Ordered Stop   09/16/14 1200  ceFAZolin (ANCEF) IVPB 2 g/50 mL premix     2 g 100 mL/hr over 30 Minutes Intravenous To Surgery 09/15/14 1347 09/16/14 1315      Assessment/Plan: s/p Procedure(s): LAPAROSCOPIC CHOLECYSTECTOMY  (N/A)  Weakness post op this morning with feelings of faintness  Will check EKG and BMP this morning.  Hgb on this mornings labs is stable Will bolus IVF     Brooklyn Jeff A 09/17/2014

## 2014-09-18 ENCOUNTER — Observation Stay (HOSPITAL_COMMUNITY): Payer: Medicare Other

## 2014-09-18 DIAGNOSIS — R1011 Right upper quadrant pain: Secondary | ICD-10-CM | POA: Diagnosis present

## 2014-09-18 DIAGNOSIS — Z91041 Radiographic dye allergy status: Secondary | ICD-10-CM | POA: Diagnosis not present

## 2014-09-18 DIAGNOSIS — Z90710 Acquired absence of both cervix and uterus: Secondary | ICD-10-CM | POA: Diagnosis not present

## 2014-09-18 DIAGNOSIS — D693 Immune thrombocytopenic purpura: Secondary | ICD-10-CM | POA: Diagnosis not present

## 2014-09-18 DIAGNOSIS — Z79899 Other long term (current) drug therapy: Secondary | ICD-10-CM | POA: Diagnosis not present

## 2014-09-18 DIAGNOSIS — E876 Hypokalemia: Secondary | ICD-10-CM | POA: Diagnosis not present

## 2014-09-18 DIAGNOSIS — K8020 Calculus of gallbladder without cholecystitis without obstruction: Secondary | ICD-10-CM | POA: Diagnosis not present

## 2014-09-18 DIAGNOSIS — Z7952 Long term (current) use of systemic steroids: Secondary | ICD-10-CM | POA: Diagnosis not present

## 2014-09-18 DIAGNOSIS — K913 Postprocedural intestinal obstruction: Secondary | ICD-10-CM | POA: Diagnosis not present

## 2014-09-18 DIAGNOSIS — R1110 Vomiting, unspecified: Secondary | ICD-10-CM | POA: Diagnosis not present

## 2014-09-18 DIAGNOSIS — Z886 Allergy status to analgesic agent status: Secondary | ICD-10-CM | POA: Diagnosis not present

## 2014-09-18 DIAGNOSIS — R531 Weakness: Secondary | ICD-10-CM | POA: Diagnosis not present

## 2014-09-18 DIAGNOSIS — Z888 Allergy status to other drugs, medicaments and biological substances status: Secondary | ICD-10-CM | POA: Diagnosis not present

## 2014-09-18 LAB — COMPREHENSIVE METABOLIC PANEL
ALK PHOS: 79 U/L (ref 38–126)
ALT: 28 U/L (ref 14–54)
ANION GAP: 8 (ref 5–15)
AST: 31 U/L (ref 15–41)
Albumin: 3.1 g/dL — ABNORMAL LOW (ref 3.5–5.0)
BILIRUBIN TOTAL: 0.9 mg/dL (ref 0.3–1.2)
BUN: 9 mg/dL (ref 6–20)
CALCIUM: 8.3 mg/dL — AB (ref 8.9–10.3)
CHLORIDE: 101 mmol/L (ref 101–111)
CO2: 26 mmol/L (ref 22–32)
CREATININE: 0.73 mg/dL (ref 0.44–1.00)
GFR calc Af Amer: >60 mL/min (ref >60)
GFR calc non Af Amer: >60 mL/min (ref >60)
Glucose, Bld: 110 mg/dL — ABNORMAL HIGH (ref 65–99)
Potassium: 3.4 mmol/L — ABNORMAL LOW (ref 3.5–5.1)
Sodium: 135 mmol/L (ref 135–145)
Total Protein: 7.1 g/dL (ref 6.5–8.1)

## 2014-09-18 LAB — CBC
HEMATOCRIT: 40.9 % (ref 36.0–46.0)
Hemoglobin: 13.6 g/dL (ref 12.0–15.0)
MCH: 28.3 pg (ref 26.0–34.0)
MCHC: 33.3 g/dL (ref 30.0–36.0)
MCV: 85.2 fL (ref 78.0–100.0)
PLATELETS: 166 10*3/uL (ref 150–400)
RBC: 4.8 MIL/uL (ref 3.87–5.11)
RDW: 14.3 % (ref 11.5–15.5)
WBC: 16.6 10*3/uL — ABNORMAL HIGH (ref 4.0–10.5)

## 2014-09-18 MED ORDER — METOCLOPRAMIDE HCL 5 MG/ML IJ SOLN
10.0000 mg | Freq: Four times a day (QID) | INTRAMUSCULAR | Status: AC
  Administered 2014-09-18 – 2014-09-19 (×3): 10 mg via INTRAVENOUS
  Filled 2014-09-18 (×3): qty 2

## 2014-09-18 MED ORDER — SODIUM CHLORIDE 0.9 % IV BOLUS (SEPSIS)
1000.0000 mL | Freq: Once | INTRAVENOUS | Status: AC
  Administered 2014-09-18: 1000 mL via INTRAVENOUS

## 2014-09-18 NOTE — Progress Notes (Signed)
2 Days Post-Op  Subjective: More episodes of emesis No passing flatus Minimal pain  Objective: Vital signs in last 24 hours: Temp:  [98.1 F (36.7 C)-98.5 F (36.9 C)] 98.3 F (36.8 C) (05/14 0419) Pulse Rate:  [62-96] 96 (05/14 0419) Resp:  [16] 16 (05/14 0419) BP: (138-170)/(58-76) 170/68 mmHg (05/14 0700) SpO2:  [92 %-98 %] 95 % (05/14 0419)    Intake/Output from previous day: 05/13 0701 - 05/14 0700 In: 2430 [P.O.:630; I.V.:1800] Out: 925 [Urine:525; Emesis/NG output:400] Intake/Output this shift:    Abdomen distended, minimally tender  Lab Results:   Recent Labs  09/17/14 0252 09/18/14 0657  WBC 16.1* 16.6*  HGB 13.7 13.6  HCT 41.8 40.9  PLT 208 166   BMET  Recent Labs  09/17/14 0902 09/18/14 0657  NA 138 135  K 4.2 3.4*  CL 104 101  CO2 26 26  GLUCOSE 138* 110*  BUN 11 9  CREATININE 0.73 0.73  CALCIUM 8.4* 8.3*   PT/INR No results for input(s): LABPROT, INR in the last 72 hours. ABG No results for input(s): PHART, HCO3 in the last 72 hours.  Invalid input(s): PCO2, PO2  Studies/Results: No results found.  Anti-infectives: Anti-infectives    Start     Dose/Rate Route Frequency Ordered Stop   09/16/14 1200  ceFAZolin (ANCEF) IVPB 2 g/50 mL premix     2 g 100 mL/hr over 30 Minutes Intravenous To Surgery 09/15/14 1347 09/16/14 1315      Assessment/Plan: s/p Procedure(s): LAPAROSCOPIC CHOLECYSTECTOMY  (N/A)  Post op ileus  Will check chest, abd xray this morning Bolus IVF Try reglan May need a NG tube     Tamara Robertson A 09/18/2014

## 2014-09-19 LAB — CBC
HEMATOCRIT: 39.3 % (ref 36.0–46.0)
Hemoglobin: 13.2 g/dL (ref 12.0–15.0)
MCH: 28.3 pg (ref 26.0–34.0)
MCHC: 33.6 g/dL (ref 30.0–36.0)
MCV: 84.2 fL (ref 78.0–100.0)
PLATELETS: 149 10*3/uL — AB (ref 150–400)
RBC: 4.67 MIL/uL (ref 3.87–5.11)
RDW: 14 % (ref 11.5–15.5)
WBC: 16.4 10*3/uL — ABNORMAL HIGH (ref 4.0–10.5)

## 2014-09-19 LAB — BASIC METABOLIC PANEL
ANION GAP: 12 (ref 5–15)
BUN: 9 mg/dL (ref 6–20)
CO2: 25 mmol/L (ref 22–32)
Calcium: 8 mg/dL — ABNORMAL LOW (ref 8.9–10.3)
Chloride: 98 mmol/L — ABNORMAL LOW (ref 101–111)
Creatinine, Ser: 0.71 mg/dL (ref 0.44–1.00)
GFR calc Af Amer: >60 mL/min (ref >60)
GFR calc non Af Amer: >60 mL/min (ref >60)
GLUCOSE: 92 mg/dL (ref 65–99)
POTASSIUM: 2.8 mmol/L — AB (ref 3.5–5.1)
Sodium: 135 mmol/L (ref 135–145)

## 2014-09-19 MED ORDER — PANTOPRAZOLE SODIUM 40 MG PO TBEC
40.0000 mg | DELAYED_RELEASE_TABLET | Freq: Every day | ORAL | Status: DC
  Administered 2014-09-19 – 2014-09-20 (×2): 40 mg via ORAL
  Filled 2014-09-19 (×2): qty 1

## 2014-09-19 MED ORDER — FUROSEMIDE 10 MG/ML IJ SOLN
20.0000 mg | Freq: Once | INTRAMUSCULAR | Status: AC
  Administered 2014-09-19: 20 mg via INTRAVENOUS
  Filled 2014-09-19: qty 2

## 2014-09-19 MED ORDER — POTASSIUM CHLORIDE 10 MEQ/100ML IV SOLN
10.0000 meq | INTRAVENOUS | Status: AC
  Administered 2014-09-19 (×4): 10 meq via INTRAVENOUS
  Filled 2014-09-19: qty 100

## 2014-09-19 NOTE — Progress Notes (Signed)
3 Days Post-Op  Subjective: Feeling much better today Had multiple BM's and passing flatus Started po  Objective: Vital signs in last 24 hours: Temp:  [98.1 F (36.7 C)-98.6 F (37 C)] 98.6 F (37 C) (05/15 0538) Pulse Rate:  [78-83] 83 (05/15 0538) Resp:  [17-20] 17 (05/15 0538) BP: (171-190)/(68-74) 179/72 mmHg (05/15 0530) SpO2:  [94 %-100 %] 94 % (05/15 0538) Last BM Date: 09/17/14  Intake/Output from previous day: 05/14 0701 - 05/15 0700 In: 955 [P.O.:240; I.V.:675] Out: 700 [Urine:700] Intake/Output this shift:    Lungs clear Abdomen soft, non tender  Lab Results:   Recent Labs  09/18/14 0657 09/19/14 0533  WBC 16.6* 16.4*  HGB 13.6 13.2  HCT 40.9 39.3  PLT 166 149*   BMET  Recent Labs  09/18/14 0657 09/19/14 0533  NA 135 135  K 3.4* 2.8*  CL 101 98*  CO2 26 25  GLUCOSE 110* 92  BUN 9 9  CREATININE 0.73 0.71  CALCIUM 8.3* 8.0*   PT/INR No results for input(s): LABPROT, INR in the last 72 hours. ABG No results for input(s): PHART, HCO3 in the last 72 hours.  Invalid input(s): PCO2, PO2  Studies/Results: Dg Abd Acute W/chest  09/18/2014   CLINICAL DATA:  Persistent vomiting.  EXAM: DG ABDOMEN ACUTE W/ 1V CHEST  COMPARISON:  Radiograph 01/27/2011  FINDINGS: Patient rotated rightward. Stable mediastinum with ectatic aorta. The ascending aorta is prominent. Some mild interstitial edema pattern. No free air beneath hemidiaphragm.  Left decubitus view demonstrates no intraperitoneal free air. There are several short air-fluid levels within the bowel. There is gas-filled loops of large and small bowel. Gas within the rectum is present.  IMPRESSION: 1. Mild interstitial edema pattern. 2. No intraperitoneal free air. 3. Short air-fluid levels and gas-filled nondilated loops of large and small bowel suggest ileus.   Electronically Signed   By: Suzy Bouchard M.D.   On: 09/18/2014 16:35    Anti-infectives: Anti-infectives    Start     Dose/Rate Route  Frequency Ordered Stop   09/16/14 1200  ceFAZolin (ANCEF) IVPB 2 g/50 mL premix     2 g 100 mL/hr over 30 Minutes Intravenous To Surgery 09/15/14 1347 09/16/14 1315      Assessment/Plan: s/p Procedure(s): LAPAROSCOPIC CHOLECYSTECTOMY  (N/A)  Ileus resolving BP up.  Suspect some volume overload.  Will give lasix Hypokalemia.  Replace K+ IV Advance diet as tolerated  ambulate  LOS: 1 day    [facility] A 09/19/2014

## 2014-09-20 ENCOUNTER — Telehealth: Payer: Self-pay | Admitting: *Deleted

## 2014-09-20 ENCOUNTER — Encounter (HOSPITAL_COMMUNITY): Payer: Self-pay | Admitting: General Surgery

## 2014-09-20 LAB — CBC
HEMATOCRIT: 37.5 % (ref 36.0–46.0)
HEMOGLOBIN: 12.6 g/dL (ref 12.0–15.0)
MCH: 28.2 pg (ref 26.0–34.0)
MCHC: 33.6 g/dL (ref 30.0–36.0)
MCV: 83.9 fL (ref 78.0–100.0)
Platelets: 166 10*3/uL (ref 150–400)
RBC: 4.47 MIL/uL (ref 3.87–5.11)
RDW: 14.1 % (ref 11.5–15.5)
WBC: 16.5 10*3/uL — ABNORMAL HIGH (ref 4.0–10.5)

## 2014-09-20 LAB — BASIC METABOLIC PANEL
ANION GAP: 8 (ref 5–15)
BUN: 10 mg/dL (ref 6–20)
CALCIUM: 8.2 mg/dL — AB (ref 8.9–10.3)
CO2: 29 mmol/L (ref 22–32)
CREATININE: 0.73 mg/dL (ref 0.44–1.00)
Chloride: 100 mmol/L — ABNORMAL LOW (ref 101–111)
GFR calc Af Amer: >60 mL/min (ref >60)
GFR calc non Af Amer: >60 mL/min (ref >60)
Glucose, Bld: 96 mg/dL (ref 65–99)
Potassium: 3 mmol/L — ABNORMAL LOW (ref 3.5–5.1)
Sodium: 137 mmol/L (ref 135–145)

## 2014-09-20 MED ORDER — TRAMADOL HCL 50 MG PO TABS: 50.0000 mg | ORAL_TABLET | Freq: Four times a day (QID) | ORAL | Status: DC | PRN

## 2014-09-20 NOTE — Telephone Encounter (Signed)
Pt was on tcm list had cholestectomy d/c 09/20/14 pt will f/u with surgeon Dr. Donne Hazel in 2 weeks....Tamara Robertson

## 2014-09-20 NOTE — Discharge Summary (Signed)
Physician Discharge Summary  Patient ID: [name] MRN: [id number] DOB/AGE: 1929-08-20 81 y.o.  Admit date: 09/16/2014 Discharge date: 09/20/2014  Admission Diagnoses: Biliary colic  Discharge Diagnoses:  Active Problems:   S/P laparoscopic cholecystectomy   Discharged Condition: good  Hospital Course: 1 yof with multiple prior surgeries and symptomatic cholelithiasis. She underwent lysis of adhesions and cholecystectomy. Postop she had an ileus that eventually has resolved and was expected.  She is now tolerating regular diet, ambulating and pain controlled  Consults: None  Significant Diagnostic Studies: none  Treatments: surgery: lap chole/lap loa  Discharge Exam: Blood pressure 135/58, pulse 83, temperature 98.8 F (37.1 C), temperature source Oral, resp. rate 17, height 5\' 4"  (1.626 m), weight 67.132 kg (148 lb), SpO2 97 %. GI: soft approp tender incisions clean  Disposition: 01-Home or Self Care     Medication List    TAKE these medications        ALPRAZolam 0.25 MG tablet  Commonly known as:  XANAX  Take 1 tablet (0.25 mg total) by mouth 2 (two) times daily as needed for anxiety.     Biotin 2500 MCG Caps  Take by mouth daily.     CITRACAL CALCIUM+D 600-40-500 MG-MG-UNIT Tb24  Generic drug:  Calcium-Magnesium-Vitamin D  Take 1 tablet by mouth daily.     CITRACAL PO  One capful three times a week     diazepam 5 MG tablet  Commonly known as:  VALIUM  Take 2.5 mg by mouth once.     dorzolamide-timolol 22.3-6.8 MG/ML ophthalmic solution  Commonly known as:  COSOPT  Place 1 drop into both eyes 2 (two) times daily.     Flaxseed Misc  by Does not apply route. Take 1 tsp daily     hydrochlorothiazide 12.5 MG capsule  Commonly known as:  MICROZIDE  TAKE ONE CAPSULE BY MOUTH DAILY AS NEEDED for swelling and edema     Magnesium 250 MG Tabs  Take 250 mg by mouth daily.     multivitamin tablet  Take 1 tablet by mouth daily.     nystatin 100000  UNIT/GM Powd  Apply 1 g topically 2 (two) times daily.     pantoprazole 20 MG tablet  Commonly known as:  PROTONIX  Take 1 tablet (20 mg total) by mouth daily.     polyethylene glycol packet  Commonly known as:  MIRALAX / GLYCOLAX  Take 17 g by mouth every 3 (three) days.     predniSONE 2.5 MG tablet  Commonly known as:  DELTASONE  Take 1 tablet (2.5 mg total) by mouth daily.     PROBIOTIC FORMULA PO  Take 1 tablet by mouth daily.     simethicone 125 MG chewable tablet  Commonly known as:  PHAZYME  Chew 1 tablet (125 mg total) by mouth every 6 (six) hours as needed for flatulence.     traMADol 50 MG tablet  Commonly known as:  ULTRAM  Take 1 tablet (50 mg total) by mouth every 6 (six) hours as needed for moderate pain.     traMADol 50 MG tablet  Commonly known as:  ULTRAM  Take 1 tablet (50 mg total) by mouth every 6 (six) hours as needed for moderate pain or severe pain.     ursodiol 300 MG capsule  Commonly known as:  ACTIGALL  Take 1 capsule (300 mg total) by mouth 2 (two) times daily.           Follow-up Information  Follow up with Emelia Loron, MD In 2 weeks.   Specialty:  General Surgery   Contact information:   [address] [place] [address] [phone]       Signed: Emelia Loron 09/20/2014, 8:51 AM

## 2014-09-20 NOTE — Progress Notes (Signed)
Discussed discharge summary with patient. Reviewed all medications with patient. Patient received Rx. Patient ready for discharge. 

## 2014-09-22 ENCOUNTER — Other Ambulatory Visit (HOSPITAL_COMMUNITY): Payer: Medicare Other

## 2014-09-24 ENCOUNTER — Ambulatory Visit: Payer: Medicare Other | Admitting: Oncology

## 2014-09-24 ENCOUNTER — Other Ambulatory Visit: Payer: Medicare Other

## 2014-09-29 ENCOUNTER — Other Ambulatory Visit (HOSPITAL_COMMUNITY): Payer: Medicare Other

## 2014-10-07 ENCOUNTER — Ambulatory Visit: Payer: Medicare Other | Admitting: Internal Medicine

## 2014-10-09 DIAGNOSIS — M542 Cervicalgia: Secondary | ICD-10-CM | POA: Diagnosis not present

## 2014-10-26 DIAGNOSIS — M47812 Spondylosis without myelopathy or radiculopathy, cervical region: Secondary | ICD-10-CM | POA: Diagnosis not present

## 2014-10-29 ENCOUNTER — Telehealth: Payer: Self-pay | Admitting: Oncology

## 2014-10-29 NOTE — Telephone Encounter (Signed)
Late note............[name] Returned patient call yesterday re rescheduling appointment. Spoke with husband and re new date/time for 12/03/14. Per husband and also the patient voicemail patient requested appointment be the end of July.

## 2014-11-03 ENCOUNTER — Other Ambulatory Visit: Payer: Medicare Other

## 2014-11-03 ENCOUNTER — Ambulatory Visit: Payer: Self-pay | Admitting: Oncology

## 2014-11-22 ENCOUNTER — Encounter: Payer: Self-pay | Admitting: Internal Medicine

## 2014-11-22 ENCOUNTER — Ambulatory Visit (INDEPENDENT_AMBULATORY_CARE_PROVIDER_SITE_OTHER): Payer: Medicare Other | Admitting: Internal Medicine

## 2014-11-22 VITALS — BP 122/72 | HR 77 | Temp 97.6°F | Ht 64.0 in | Wt 143.8 lb

## 2014-11-22 DIAGNOSIS — Z0000 Encounter for general adult medical examination without abnormal findings: Secondary | ICD-10-CM | POA: Diagnosis not present

## 2014-11-22 DIAGNOSIS — M5030 Other cervical disc degeneration, unspecified cervical region: Secondary | ICD-10-CM

## 2014-11-22 MED ORDER — METHOCARBAMOL 500 MG PO TABS: 500.0000 mg | ORAL_TABLET | Freq: Three times a day (TID) | ORAL | Status: DC | PRN

## 2014-11-22 NOTE — Progress Notes (Signed)
Subjective:    Patient ID: Tamara Robertson, female    DOB: [birth year], 81 y.o.   MRN: [id number]  HPI   Here for medicare wellness  Diet: heart healthy Physical activity: sedentary Depression/mood screen: negative Hearing: intact to whispered voice Visual acuity: grossly normal, performs annual eye exam  ADLs: capable Fall risk: none Home safety: good Cognitive evaluation: intact to orientation, naming, recall and repetition EOL planning: adv directives, full code/ I agree  I have personally reviewed and have noted 1. The patient's medical and social history 2. Their use of alcohol, tobacco or illicit drugs 3. Their current medications and supplements 4. The patient's functional ability including ADL's, fall risks, home safety risks and hearing or visual impairment. 5. Diet and physical activities 6. Evidence for depression or mood disorders  Also reviewed chronic medical conditions, interval events and current concerns. Currently supporting spouse has been recently diagnosed with lung mass and brain metastasis, presumed pulmonary adenocarcinoma. Abdominal pain symptoms have greatly improved since successful cholecystectomy done 2 months ago. Currently only concern is neck spasm and pain, no radiation into back, neck, arms. Not associated with weakness  Past Medical History  Diagnosis Date  . OSTEOARTHRITIS, KNEE, RIGHT   . URINARY INCONTINENCE   . Irritable bowel syndrome   . DIVERTICULITIS, HX OF   . OSTEOPENIA   . Immune thrombocytopenic purpura     chronic - baseline 80-100K, on pred  . UNSPECIFIED PERIPHERAL VASCULAR DISEASE   . OVERACTIVE BLADDER   . DYSLIPIDEMIA   . Glaucoma   . GERD   . HOH (hard of hearing)   . CAD (coronary artery disease)     RCA 40% stenosis cath 01/2011  . Left ovarian cyst dx 01/2013 CT    working with gyn, ?malignant - elevated tumor marker OVA1  . Cholelithiasis     s/p lap chole 09/2014  . Elevated LFTs   . Hepatic steatosis   .  Anxiety    Family History  Problem Relation Age of Onset  . Coronary artery disease Mother   . Heart attack Mother 37  . Hyperlipidemia Mother   . Hypertension Mother   . Stomach cancer Father   . Hypertension Daughter   . Hyperlipidemia Daughter   . Arthritis      parent  . Transient ischemic attack      parent  . Colon cancer Neg Hx    History  Substance Use Topics  . Smoking status: Never Smoker   . Smokeless tobacco: Never Used  . Alcohol Use: No     Comment: rarely    Review of Systems  Constitutional: Negative for fatigue and unexpected weight change.  Respiratory: Negative for cough, shortness of breath and wheezing.   Cardiovascular: Negative for chest pain, palpitations and leg swelling.  Gastrointestinal: Negative for nausea, abdominal pain and diarrhea.  Musculoskeletal: Positive for neck pain.  Neurological: Negative for dizziness, weakness, light-headedness and headaches.  Psychiatric/Behavioral: Negative for dysphoric mood. The patient is not nervous/anxious.   All other systems reviewed and are negative.   Patient Care Team: Newt Lukes, MD as PCP - General (Internal Medicine) Mia Creek, MD as Consulting Physician (Ophthalmology) Lunette Stands, MD as Consulting Physician (Orthopedic Surgery) Rollene Rotunda, MD as Consulting Physician (Cardiology) Venancio Poisson, MD as Consulting Physician (Dermatology) Benjiman Core, MD (Hematology and Oncology) [name], MD (Obstetrics and Gynecology) Suzanna Obey, MD (Otolaryngology) Eileen Stanford, MD (Allergy and Immunology) Beverley Fiedler, MD (Gastroenterology) Cleda Mccreedy, MD (Gynecologic  Oncology) Carlus Pavlov, MD (Endocrinology) Emelia Loron, MD (General Surgery)     Objective:    Physical Exam  Constitutional: She appears well-developed and well-nourished. No distress.  Cardiovascular: Normal rate, regular rhythm and normal heart sounds.   No murmur heard. Pulmonary/Chest: Effort  normal and breath sounds normal. No respiratory distress.  Musculoskeletal: She exhibits no edema.    BP 122/72 mmHg  Pulse 77  Temp(Src) 97.6 F (36.4 C) (Oral)  Ht 5\' 4"  (1.626 m)  Wt 143 lb 12 oz (65.205 kg)  BMI 24.66 kg/m2  SpO2 98% Wt Readings from Last 3 Encounters:  11/22/14 143 lb 12 oz (65.205 kg)  09/16/14 148 lb (67.132 kg)  09/14/14 148 lb 9.6 oz (67.405 kg)    Lab Results  Component Value Date   WBC 16.5* 09/20/2014   HGB 12.6 09/20/2014   HCT 37.5 09/20/2014   PLT 166 09/20/2014   GLUCOSE 96 09/20/2014   CHOL 215* 06/22/2014   TRIG 161* 06/22/2014   HDL 51 06/22/2014   LDLDIRECT 155.9 03/19/2014   LDLCALC 132* 06/22/2014   ALT 28 09/18/2014   AST 31 09/18/2014   NA 137 09/20/2014   K 3.0* 09/20/2014   CL 100* 09/20/2014   CREATININE 0.73 09/20/2014   BUN 10 09/20/2014   CO2 29 09/20/2014   TSH 2.95 06/03/2012   INR 1.1* 02/16/2011   HGBA1C 5.8 03/19/2014    No results found.     Assessment & Plan:   AWV/z00.00 - Today patient counseled on age appropriate routine health concerns for screening and prevention, each reviewed and up to date or declined. Immunizations reviewed and up to date or declined. Labs reviewed. Risk factors for depression reviewed and negative. Hearing function and visual acuity are intact. ADLs screened and addressed as needed. Functional ability and level of safety reviewed and appropriate. Education, counseling and referrals performed based on assessed risks today. Patient provided with a copy of personalized plan for preventive services.   Neck spasm/pain with known cervical DDD. Has seen orthopedics Dr. Leavy Cella tech for same. Has been prescribed prednisone pack 3 with temporary relief. Does not wish to resume another course of prednisone and on maintenance Pred dose at this time for chronic ITP. Will add muscle relaxer and refer to sports medicine for another opinion on evaluation and treatment. Consider physical therapy if no  osteopathic intervention available. Reports x-ray of neck negative for fracture by orthopedic on evaluation last month, therefore will not repeat imaging at this time given lack of change to symptoms and no neuro symptoms/deficits  Problem List Items Addressed This Visit    None    Visit Diagnoses    Routine general medical examination at a health care facility    -  Primary    DDD (degenerative disc disease), cervical        Relevant Medications    methocarbamol (ROBAXIN) 500 MG tablet    Other Relevant Orders    Ambulatory referral to Sports Medicine        Rene Paci, MD

## 2014-11-22 NOTE — Progress Notes (Signed)
Pre visit review using our clinic review tool, if applicable. No additional management support is needed unless otherwise documented below in the visit note. 

## 2014-11-22 NOTE — Patient Instructions (Addendum)
It was good to see you today.  We have reviewed your prior records including labs and tests today  Health Maintenance reviewed - all recommended immunizations and age-appropriate screenings are up-to-date.  we'll make referral to see Dr. Tamala Julian for your neck pain. Our office will contact you regarding appointment(s) once made.  Medications reviewed and updated, no changes recommended at this time.  Please schedule followup in 6-12 months for semiannual exam and labs, call sooner if problems.  Health Maintenance Adopting a healthy lifestyle and getting preventive care can go a long way to promote health and wellness. Talk with your health care provider about what schedule of regular examinations is right for you. This is a good chance for you to check in with your provider about disease prevention and staying healthy. In between checkups, there are plenty of things you can do on your own. Experts have done a lot of research about which lifestyle changes and preventive measures are most likely to keep you healthy. Ask your health care provider for more information. WEIGHT AND DIET  Eat a healthy diet  Be sure to include plenty of vegetables, fruits, low-fat dairy products, and lean protein.  Do not eat a lot of foods high in solid fats, added sugars, or salt.  Get regular exercise. This is one of the most important things you can do for your health.  Most adults should exercise for at least 150 minutes each week. The exercise should increase your heart rate and make you sweat (moderate-intensity exercise).  Most adults should also do strengthening exercises at least twice a week. This is in addition to the moderate-intensity exercise.  Maintain a healthy weight  Body mass index (BMI) is a measurement that can be used to identify possible weight problems. It estimates body fat based on height and weight. Your health care provider can help determine your BMI and help you achieve or maintain  a healthy weight.  For females 25 years of age and older:   A BMI below 18.5 is considered underweight.  A BMI of 18.5 to 24.9 is normal.  A BMI of 25 to 29.9 is considered overweight.  A BMI of 30 and above is considered obese.  Watch levels of cholesterol and blood lipids  You should start having your blood tested for lipids and cholesterol at 79 years of age, then have this test every 5 years.  You may need to have your cholesterol levels checked more often if:  Your lipid or cholesterol levels are high.  You are older than 79 years of age.  You are at high risk for heart disease.  CANCER SCREENING   Lung Cancer  Lung cancer screening is recommended for adults 79-47 years old who are at high risk for lung cancer because of a history of smoking.  A yearly low-dose CT scan of the lungs is recommended for people who:  Currently smoke.  Have quit within the past 15 years.  Have at least a 30-pack-year history of smoking. A pack year is smoking an average of one pack of cigarettes a day for 1 year.  Yearly screening should continue until it has been 15 years since you quit.  Yearly screening should stop if you develop a health problem that would prevent you from having lung cancer treatment.  Breast Cancer  Practice breast self-awareness. This means understanding how your breasts normally appear and feel.  It also means doing regular breast self-exams. Let your health care provider know about any  changes, no matter how small.  If you are in your 20s or 30s, you should have a clinical breast exam (CBE) by a health care provider every 1-3 years as part of a regular health exam.  If you are 40 or older, have a CBE every year. Also consider having a breast X-ray (mammogram) every year.  If you have a family history of breast cancer, talk to your health care provider about genetic screening.  If you are at high risk for breast cancer, talk to your health care provider  about having an MRI and a mammogram every year.  Breast cancer gene (BRCA) assessment is recommended for women who have family members with BRCA-related cancers. BRCA-related cancers include:  Breast.  Ovarian.  Tubal.  Peritoneal cancers.  Results of the assessment will determine the need for genetic counseling and BRCA1 and BRCA2 testing. Cervical Cancer Routine pelvic examinations to screen for cervical cancer are no longer recommended for nonpregnant women who are considered low risk for cancer of the pelvic organs (ovaries, uterus, and vagina) and who do not have symptoms. A pelvic examination may be necessary if you have symptoms including those associated with pelvic infections. Ask your health care provider if a screening pelvic exam is right for you.   The Pap test is the screening test for cervical cancer for women who are considered at risk.  If you had a hysterectomy for a problem that was not cancer or a condition that could lead to cancer, then you no longer need Pap tests.  If you are older than 65 years, and you have had normal Pap tests for the past 10 years, you no longer need to have Pap tests.  If you have had past treatment for cervical cancer or a condition that could lead to cancer, you need Pap tests and screening for cancer for at least 20 years after your treatment.  If you no longer get a Pap test, assess your risk factors if they change (such as having a new sexual partner). This can affect whether you should start being screened again.  Some women have medical problems that increase their chance of getting cervical cancer. If this is the case for you, your health care provider may recommend more frequent screening and Pap tests.  The human papillomavirus (HPV) test is another test that may be used for cervical cancer screening. The HPV test looks for the virus that can cause cell changes in the cervix. The cells collected during the Pap test can be tested for  HPV.  The HPV test can be used to screen women 44 years of age and older. Getting tested for HPV can extend the interval between normal Pap tests from three to five years.  An HPV test also should be used to screen women of any age who have unclear Pap test results.  After 79 years of age, women should have HPV testing as often as Pap tests.  Colorectal Cancer  This type of cancer can be detected and often prevented.  Routine colorectal cancer screening usually begins at 79 years of age and continues through 79 years of age.  Your health care provider may recommend screening at an earlier age if you have risk factors for colon cancer.  Your health care provider may also recommend using home test kits to check for hidden blood in the stool.  A small camera at the end of a tube can be used to examine your colon directly (sigmoidoscopy or colonoscopy).  This is done to check for the earliest forms of colorectal cancer.  Routine screening usually begins at age 55.  Direct examination of the colon should be repeated every 5-10 years through 79 years of age. However, you may need to be screened more often if early forms of precancerous polyps or small growths are found. Skin Cancer  Check your skin from head to toe regularly.  Tell your health care provider about any new moles or changes in moles, especially if there is a change in a mole's shape or color.  Also tell your health care provider if you have a mole that is larger than the size of a pencil eraser.  Always use sunscreen. Apply sunscreen liberally and repeatedly throughout the day.  Protect yourself by wearing long sleeves, pants, a wide-brimmed hat, and sunglasses whenever you are outside. HEART DISEASE, DIABETES, AND HIGH BLOOD PRESSURE   Have your blood pressure checked at least every 1-2 years. High blood pressure causes heart disease and increases the risk of stroke.  If you are between 44 years and 38 years old, ask  your health care provider if you should take aspirin to prevent strokes.  Have regular diabetes screenings. This involves taking a blood sample to check your fasting blood sugar level.  If you are at a normal weight and have a low risk for diabetes, have this test once every three years after 79 years of age.  If you are overweight and have a high risk for diabetes, consider being tested at a younger age or more often. PREVENTING INFECTION  Hepatitis B  If you have a higher risk for hepatitis B, you should be screened for this virus. You are considered at high risk for hepatitis B if:  You were born in a country where hepatitis B is common. Ask your health care provider which countries are considered high risk.  Your parents were born in a high-risk country, and you have not been immunized against hepatitis B (hepatitis B vaccine).  You have HIV or AIDS.  You use needles to inject street drugs.  You live with someone who has hepatitis B.  You have had sex with someone who has hepatitis B.  You get hemodialysis treatment.  You take certain medicines for conditions, including cancer, organ transplantation, and autoimmune conditions. Hepatitis C  Blood testing is recommended for:  Everyone born from [birth year] through 1963.  Anyone with known risk factors for hepatitis C. Sexually transmitted infections (STIs)  You should be screened for sexually transmitted infections (STIs) including gonorrhea and chlamydia if:  You are sexually active and are younger than 79 years of age.  You are older than 79 years of age and your health care provider tells you that you are at risk for this type of infection.  Your sexual activity has changed since you were last screened and you are at an increased risk for chlamydia or gonorrhea. Ask your health care provider if you are at risk.  If you do not have HIV, but are at risk, it may be recommended that you take a prescription medicine daily to  prevent HIV infection. This is called pre-exposure prophylaxis (PrEP). You are considered at risk if:  You are sexually active and do not regularly use condoms or know the HIV status of your partner(s).  You take drugs by injection.  You are sexually active with a partner who has HIV. Talk with your health care provider about whether you are at high risk of  being infected with HIV. If you choose to begin PrEP, you should first be tested for HIV. You should then be tested every 3 months for as long as you are taking PrEP.  PREGNANCY   If you are premenopausal and you may become pregnant, ask your health care provider about preconception counseling.  If you may become pregnant, take 400 to 800 micrograms (mcg) of folic acid every day.  If you want to prevent pregnancy, talk to your health care provider about birth control (contraception). OSTEOPOROSIS AND MENOPAUSE   Osteoporosis is a disease in which the bones lose minerals and strength with aging. This can result in serious bone fractures. Your risk for osteoporosis can be identified using a bone density scan.  If you are 58 years of age or older, or if you are at risk for osteoporosis and fractures, ask your health care provider if you should be screened.  Ask your health care provider whether you should take a calcium or vitamin D supplement to lower your risk for osteoporosis.  Menopause may have certain physical symptoms and risks.  Hormone replacement therapy may reduce some of these symptoms and risks. Talk to your health care provider about whether hormone replacement therapy is right for you.  HOME CARE INSTRUCTIONS   Schedule regular health, dental, and eye exams.  Stay current with your immunizations.   Do not use any tobacco products including cigarettes, chewing tobacco, or electronic cigarettes.  If you are pregnant, do not drink alcohol.  If you are breastfeeding, limit how much and how often you drink  alcohol.  Limit alcohol intake to no more than 1 drink per day for nonpregnant women. One drink equals 12 ounces of beer, 5 ounces of wine, or 1 ounces of hard liquor.  Do not use street drugs.  Do not share needles.  Ask your health care provider for help if you need support or information about quitting drugs.  Tell your health care provider if you often feel depressed.  Tell your health care provider if you have ever been abused or do not feel safe at home. Document Released: 11/06/2010 Document Revised: 09/07/2013 Document Reviewed: 03/25/2013 [facility] Patient Information 2015 [place], [place]. This information is not intended to replace advice given to you by your health care provider. Make sure you discuss any questions you have with your health care provider.

## 2014-11-26 DIAGNOSIS — H401431 Capsular glaucoma with pseudoexfoliation of lens, bilateral, mild stage: Secondary | ICD-10-CM | POA: Diagnosis not present

## 2014-11-26 DIAGNOSIS — Z961 Presence of intraocular lens: Secondary | ICD-10-CM | POA: Diagnosis not present

## 2014-12-03 ENCOUNTER — Ambulatory Visit: Payer: Self-pay | Admitting: Oncology

## 2014-12-03 ENCOUNTER — Telehealth: Payer: Self-pay | Admitting: Oncology

## 2014-12-03 ENCOUNTER — Other Ambulatory Visit: Payer: Medicare Other

## 2014-12-03 NOTE — Telephone Encounter (Signed)
Pt called to r/s apt due to husband just came home and needed to be with him, pt confirmed new D/T and also saw it on mychart... KJ

## 2014-12-13 ENCOUNTER — Ambulatory Visit: Payer: Medicare Other | Admitting: Cardiology

## 2014-12-15 ENCOUNTER — Ambulatory Visit: Payer: Medicare Other | Admitting: Family Medicine

## 2014-12-16 ENCOUNTER — Ambulatory Visit (HOSPITAL_BASED_OUTPATIENT_CLINIC_OR_DEPARTMENT_OTHER): Payer: Medicare Other | Admitting: Oncology

## 2014-12-16 ENCOUNTER — Other Ambulatory Visit (HOSPITAL_BASED_OUTPATIENT_CLINIC_OR_DEPARTMENT_OTHER): Payer: Medicare Other

## 2014-12-16 ENCOUNTER — Telehealth: Payer: Self-pay | Admitting: Oncology

## 2014-12-16 VITALS — BP 139/61 | HR 76 | Temp 97.9°F | Resp 18 | Ht 64.0 in | Wt 144.9 lb

## 2014-12-16 DIAGNOSIS — D693 Immune thrombocytopenic purpura: Secondary | ICD-10-CM

## 2014-12-16 LAB — CBC WITH DIFFERENTIAL/PLATELET
BASO%: 0.9 % (ref 0.0–2.0)
BASOS ABS: 0.1 10*3/uL (ref 0.0–0.1)
EOS ABS: 0.3 10*3/uL (ref 0.0–0.5)
EOS%: 2.8 % (ref 0.0–7.0)
HEMATOCRIT: 41.4 % (ref 34.8–46.6)
HGB: 13.7 g/dL (ref 11.6–15.9)
LYMPH%: 35.1 % (ref 14.0–49.7)
MCH: 28.6 pg (ref 25.1–34.0)
MCHC: 33.2 g/dL (ref 31.5–36.0)
MCV: 86 fL (ref 79.5–101.0)
MONO#: 1.3 10*3/uL — ABNORMAL HIGH (ref 0.1–0.9)
MONO%: 12.7 % (ref 0.0–14.0)
NEUT#: 5 10*3/uL (ref 1.5–6.5)
NEUT%: 48.5 % (ref 38.4–76.8)
Platelets: 161 10*3/uL (ref 145–400)
RBC: 4.81 10*6/uL (ref 3.70–5.45)
RDW: 14.2 % (ref 11.2–14.5)
WBC: 10.3 10*3/uL (ref 3.9–10.3)
lymph#: 3.6 10*3/uL — ABNORMAL HIGH (ref 0.9–3.3)

## 2014-12-16 LAB — COMPREHENSIVE METABOLIC PANEL (CC13)
ALBUMIN: 3.7 g/dL (ref 3.5–5.0)
ALK PHOS: 84 U/L (ref 40–150)
ALT: 16 U/L (ref 0–55)
AST: 23 U/L (ref 5–34)
Anion Gap: 7 mEq/L (ref 3–11)
BILIRUBIN TOTAL: 0.41 mg/dL (ref 0.20–1.20)
BUN: 12 mg/dL (ref 7.0–26.0)
CHLORIDE: 106 meq/L (ref 98–109)
CO2: 28 meq/L (ref 22–29)
Calcium: 8.9 mg/dL (ref 8.4–10.4)
Creatinine: 0.9 mg/dL (ref 0.6–1.1)
EGFR: 59 mL/min/{1.73_m2} — AB (ref >90)
GLUCOSE: 89 mg/dL (ref 70–140)
Potassium: 4.2 mEq/L (ref 3.5–5.1)
Sodium: 141 mEq/L (ref 136–145)
TOTAL PROTEIN: 6.7 g/dL (ref 6.4–8.3)

## 2014-12-16 NOTE — Telephone Encounter (Signed)
Gave patient avs report and appointments for February 2017.  °

## 2014-12-16 NOTE — Progress Notes (Signed)
Hematology and Oncology Follow Up Visit  Tamara Robertson [id number] Jun 18, 1929 81 y.o. 12/16/2014 1:00 PM   Principle Diagnosis: 81 year old with chronic, relapsing ITP diagnosed in the 56s.   Prior Therapy: Patient S/P splenectomy and subsequently treated with high doses of steroids. The patient have had a complete response to steroids back in the 60s and all the way have had a few relapses. Every time she has a relapse she gets restarted on high-dose of steroids and she achieved a complete response. The most recent of relapses before her move to [facility] she was hospitalized for a platelet count of 8000 around the year 2000.  Current therapy: Prednisone to 2.5 mg every other day. She has been in remission for the last 16 years.  Interim History: Tamara Robertson returns today for a follow up visit by himself. Since the last visit, she has been dealing with a lot of stress associated with her husband's health. He was diagnosed with lung cancer with brain metastasis and currently in the process of being treated for that. These events resulted in mental and physical stress which she is coping with. She underwent a cholecystectomy in May 2016 and have tolerated it well. She did not have any postoperative bleeding.  She is completely asymptomatic at this point. She she is not reporting any bleeding, easy bruisability, or any skin changes. She has not reported any hematochezia or melena she has not reported any epistaxis or hemoptysis.   She has not reported any headaches or blurry vision or double vision. Has not reported any chest pain cough or hemoptysis. Has not reported any nausea or vomiting or abdominal pain. Has not reported any frequency urgency or hematuria. Rest or view of systems unremarkable.   Medications: I have reviewed the patient's current medications. Unchanged by my review. Current Outpatient Prescriptions  Medication Sig Dispense Refill  . ALPRAZolam (XANAX) 0.25 MG tablet Take 1  tablet (0.25 mg total) by mouth 2 (two) times daily as needed for anxiety. 20 tablet 0  . Biotin 2500 MCG CAPS Take by mouth daily.    . Calcium Citrate (CITRACAL PO) One capful three times a week    . Calcium-Magnesium-Vitamin D (CITRACAL CALCIUM+D) 600-40-500 MG-MG-UNIT TB24 Take 1 tablet by mouth daily.     . diazepam (VALIUM) 5 MG tablet Take 2.5 mg by mouth once.    . dorzolamide-timolol (COSOPT) 22.3-6.8 MG/ML ophthalmic solution Place 1 drop into both eyes 2 (two) times daily.     . Flaxseed MISC by Does not apply route. Take 1 tsp daily    . fluconazole (DIFLUCAN) 150 MG tablet     . Magnesium 250 MG TABS Take 250 mg by mouth daily.    . methocarbamol (ROBAXIN) 500 MG tablet Take 1 tablet (500 mg total) by mouth every 8 (eight) hours as needed for muscle spasms. 20 tablet 1  . Multiple Vitamin (MULTIVITAMIN) tablet Take 1 tablet by mouth daily.      [name] nystatin (MYCOSTATIN/NYSTOP) 100000 UNIT/GM POWD Apply 1 g topically 2 (two) times daily. 60 g 3  . pantoprazole (PROTONIX) 20 MG tablet Take 1 tablet (20 mg total) by mouth daily. 90 tablet 3  . polyethylene glycol (MIRALAX / GLYCOLAX) packet Take 17 g by mouth every 3 (three) days.    . predniSONE (DELTASONE) 2.5 MG tablet Take 1 tablet (2.5 mg total) by mouth daily. 30 tablet 1  . Probiotic Product (PROBIOTIC FORMULA PO) Take 1 tablet by mouth daily.      [name]  simethicone (PHAZYME) 125 MG chewable tablet Chew 1 tablet (125 mg total) by mouth every 6 (six) hours as needed for flatulence. 30 tablet 0  . traMADol (ULTRAM) 50 MG tablet Take 1 tablet (50 mg total) by mouth every 6 (six) hours as needed for moderate pain. 60 tablet 1   No current facility-administered medications for this visit.    Allergies:  Allergies  Allergen Reactions  . Aspirin Other (See Comments)    ITP  . Contrast Media [Iodinated Diagnostic Agents]     hives  . Statins     Past Medical History, Surgical history, Social history, and Family History were  reviewed and updated.  Physical Exam: Blood pressure 139/61, pulse 76, temperature 97.9 F (36.6 C), temperature source Oral, resp. rate 18, height 5\' 4"  (1.626 m), weight 144 lb 14.4 oz (65.726 kg), SpO2 99 %. ECOG: 1 General appearance: alert awake not in any distress.  Head: Normocephalic, without obvious abnormality Neck: no adenopathy, no thyroid masses. Lymph nodes: Cervical, supraclavicular, and axillary nodes normal. Heart:regular rate and rhythm, S1, S2 normal, no murmur, click, rub or gallop Lung:chest clear, no wheezing, rales, normal symmetric air entry Abdomin: soft, non-tender, without masses or organomegaly  EXT:no erythema, induration, or nodules Neurologically intact.  Skin: No ecchymosis or petechiae.    Lab Results: Lab Results  Component Value Date   WBC 10.3 12/16/2014   HGB 13.7 12/16/2014   HCT 41.4 12/16/2014   MCV 86.0 12/16/2014   PLT 161 12/16/2014     Chemistry      Component Value Date/Time   NA 137 09/20/2014 0513   NA 142 03/26/2014 1253   K 3.0* 09/20/2014 0513   K 4.3 03/26/2014 1253   CL 100* 09/20/2014 0513   CL 105 04/17/2012 1452   CO2 29 09/20/2014 0513   CO2 28 03/26/2014 1253   BUN 10 09/20/2014 0513   BUN 19.0 03/26/2014 1253   CREATININE 0.73 09/20/2014 0513   CREATININE 1.0 03/26/2014 1253      Component Value Date/Time   CALCIUM 8.2* 09/20/2014 0513   CALCIUM 9.6 03/26/2014 1253   ALKPHOS 79 09/18/2014 0657   ALKPHOS 112 03/26/2014 1253   AST 31 09/18/2014 0657   AST 23 03/26/2014 1253   ALT 28 09/18/2014 0657   ALT 19 03/26/2014 1253   BILITOT 0.9 09/18/2014 0657   BILITOT 0.34 03/26/2014 1253      Impression and Plan:   81 year old female with the following issues:   1. Chronic relapsing ITP: She had multiple relapses in the past that is highly responsive to prednisone. She is currently on low-dose maintenance which seems to be effective in the limiting her relapses. Her platelet count is 161,000 and have been  around this range for an extended period of time. The plan is to continue with active surveillance and maintain her at 2.5 mg of prednisone. We will not attempt to discontinue this given the stability of her platelets.   2. Followup: In 6 months.         Eli Hose, MD 8/11/20161:00 PM

## 2014-12-23 DIAGNOSIS — Z01419 Encounter for gynecological examination (general) (routine) without abnormal findings: Secondary | ICD-10-CM | POA: Diagnosis not present

## 2014-12-28 ENCOUNTER — Encounter: Payer: Self-pay | Admitting: Family Medicine

## 2014-12-28 ENCOUNTER — Ambulatory Visit (INDEPENDENT_AMBULATORY_CARE_PROVIDER_SITE_OTHER): Payer: Medicare Other | Admitting: Family Medicine

## 2014-12-28 VITALS — BP 118/76 | HR 83 | Wt 144.0 lb

## 2014-12-28 DIAGNOSIS — M5030 Other cervical disc degeneration, unspecified cervical region: Secondary | ICD-10-CM | POA: Insufficient documentation

## 2014-12-28 NOTE — Assessment & Plan Note (Signed)
She does have degenerative disc disease. We recommend over-the-counter natural supplementations that I think will be beneficial. Patient is having more muscle spasms and we discussed different pressure points in different home remedies that could be beneficial. We discussed icing regimen as well as postural control. Patient given home exercise program. Patient will try these interventions will call if any worsening symptoms occur. Patient could be a candidate for trigger point injections if necessary. I would like to avoid oral prednisone if possible.

## 2014-12-28 NOTE — Progress Notes (Signed)
[facility] [address], [place] [address] Phone: [phone] Subjective:    I'm seeing this patient by the request  of:  Gwendolyn Grant, MD  CC: neck pain   TIR:[id number] Tamara Robertson is a 81 y.o. female coming in with complaint of  Neck pain.  Patient does have known degenerative disc disease at multiple levels. Patient was having significant amount of pain after she had a gallbladder surgery and had to sleep in a chair. States that it seemed to last for proximally 4 weeks and now has started to improve slowly. Patient states though that she still has a tightness in her right shoulder blade as well as sometimes at the base of first toe. States that it is very minimal. Now only affecting her when she does a lot of activities. Patient is taking care of her ailing husband which has increased the amount of activity she is doing. Patient does notice some association with stress. States that when it occurs is very tight. Can be extremely painful and stop her from trying to even move her neck. Did respond somewhat to muscle relaxers but did not respond much to prednisone. Patient did go through 3 series of bursts.  Past Medical History  Diagnosis Date  . OSTEOARTHRITIS, KNEE, RIGHT   . URINARY INCONTINENCE   . Irritable bowel syndrome   . DIVERTICULITIS, HX OF   . OSTEOPENIA   . Immune thrombocytopenic purpura     chronic - baseline 80-100K, on pred  . UNSPECIFIED PERIPHERAL VASCULAR DISEASE   . OVERACTIVE BLADDER   . DYSLIPIDEMIA   . Glaucoma   . GERD   . HOH (hard of hearing)   . CAD (coronary artery disease)     RCA 40% stenosis cath 01/2011  . Left ovarian cyst dx 01/2013 CT    working with gyn, ?malignant - elevated tumor marker OVA1  . Cholelithiasis     s/p lap chole 09/2014  . Elevated LFTs   . Hepatic steatosis   . Anxiety    Past Surgical History  Procedure Laterality Date  . Splenectomy  1954  . Abdominal hysterectomy  1963  . L  pop pta  10/2009    stent  . Laparoscopic sigmoid colectomy  10/2005  . Cataract extraction, bilateral  10/2010  . Knee arthroscopy Right   . Angioplasty    . Cardiac catheterization    . Cholecystectomy N/A 09/16/2014    Procedure: LAPAROSCOPIC CHOLECYSTECTOMY ;  Surgeon: Rolm Bookbinder, MD;  Location: [facility];  Service: General;  Laterality: N/A;  . Appendectomy  1956   Social History  Substance Use Topics  . Smoking status: Never Smoker   . Smokeless tobacco: Never Used  . Alcohol Use: No     Comment: rarely   Allergies  Allergen Reactions  . Aspirin Other (See Comments)    ITP  . Contrast Media [Iodinated Diagnostic Agents]     hives  . Statins        Past medical history, social, surgical and family history all reviewed in electronic medical record.   Review of Systems: No headache, visual changes, nausea, vomiting, diarrhea, constipation, dizziness, abdominal pain, skin rash, fevers, chills, night sweats, weight loss, swollen lymph nodes, body aches, joint swelling, muscle aches, chest pain, shortness of breath, mood changes.   Objective There were no vitals taken for this visit.  General: No apparent distress alert and oriented x3 mood and affect normal, dressed appropriately.  HEENT:  Pupils equal, extraocular movements intact  Respiratory: Patient's speak in full sentences and does not appear short of breath  Cardiovascular: No lower extremity edema, non tender, no erythema  Skin: Warm dry intact with no signs of infection or rash on extremities or on axial skeleton.  Abdomen: Soft nontender  Neuro: Cranial nerves II through XII are intact, neurovascularly intact in all extremities with 2+ DTRs and 2+ pulses.  Lymph: No lymphadenopathy of posterior or anterior cervical chain or axillae bilaterally.  Gait normal with good balance and coordination.  MSK:  Non tender with full range of motion and good stability and symmetric strength and tone of shoulders, elbows, wrist,  hip, knee and ankles bilaterally. Osteophytic changes of multiple joints Neck: Inspection unremarkable.mild increase in kyphosis of the upper thoracic No palpable stepoffs. Negative Spurling's maneuver. Lacking the last 5 of flexion and extension as well as rotation bilaterally. Grip strength and sensation normal in bilateral hands Strength good C4 to T1 distribution No sensory change to C4 to T1 Negative Hoffman sign bilaterally Reflexes normal Trigger point noted over T3 in the trapezius on the right side   Impression and Recommendations:     This case required medical decision making of moderate complexity.

## 2014-12-28 NOTE — Patient Instructions (Addendum)
Good to see you.  Ice 20 minutes 2 times daily. Usually after activity and before bed. Exercises 3 times a week.  Tart cheery extract at night any dose Turmeric 500mg  ONCE daily Vitamin D extra 1000 IU daly can help with muscle strength.  Tennis ball to area and message when needed 2 tennisball in tube sock and lay on them at base of skull when tight If worsening symptoms give me a call and can try trigger point injections.  Keep it up!

## 2014-12-30 DIAGNOSIS — D0439 Carcinoma in situ of skin of other parts of face: Secondary | ICD-10-CM | POA: Diagnosis not present

## 2014-12-30 DIAGNOSIS — L820 Inflamed seborrheic keratosis: Secondary | ICD-10-CM | POA: Diagnosis not present

## 2014-12-30 DIAGNOSIS — L308 Other specified dermatitis: Secondary | ICD-10-CM | POA: Diagnosis not present

## 2014-12-30 DIAGNOSIS — Z85828 Personal history of other malignant neoplasm of skin: Secondary | ICD-10-CM | POA: Diagnosis not present

## 2014-12-30 DIAGNOSIS — D044 Carcinoma in situ of skin of scalp and neck: Secondary | ICD-10-CM | POA: Diagnosis not present

## 2014-12-30 DIAGNOSIS — R21 Rash and other nonspecific skin eruption: Secondary | ICD-10-CM | POA: Diagnosis not present

## 2015-01-05 ENCOUNTER — Encounter: Payer: Self-pay | Admitting: Internal Medicine

## 2015-01-05 ENCOUNTER — Other Ambulatory Visit: Payer: Self-pay | Admitting: Internal Medicine

## 2015-01-05 ENCOUNTER — Other Ambulatory Visit (INDEPENDENT_AMBULATORY_CARE_PROVIDER_SITE_OTHER): Payer: Medicare Other

## 2015-01-05 ENCOUNTER — Ambulatory Visit (INDEPENDENT_AMBULATORY_CARE_PROVIDER_SITE_OTHER): Payer: Medicare Other | Admitting: Internal Medicine

## 2015-01-05 VITALS — BP 138/70 | HR 75 | Temp 98.2°F | Resp 16 | Ht 64.0 in | Wt 144.8 lb

## 2015-01-05 DIAGNOSIS — D693 Immune thrombocytopenic purpura: Secondary | ICD-10-CM | POA: Diagnosis not present

## 2015-01-05 DIAGNOSIS — M79662 Pain in left lower leg: Secondary | ICD-10-CM

## 2015-01-05 DIAGNOSIS — I872 Venous insufficiency (chronic) (peripheral): Secondary | ICD-10-CM | POA: Diagnosis not present

## 2015-01-05 DIAGNOSIS — M79661 Pain in right lower leg: Secondary | ICD-10-CM

## 2015-01-05 LAB — CK: CK TOTAL: 70 U/L (ref 7–177)

## 2015-01-05 LAB — MAGNESIUM: MAGNESIUM: 2.2 mg/dL (ref 1.5–2.5)

## 2015-01-05 NOTE — Progress Notes (Signed)
Subjective:    Patient ID: Tamara Robertson, female    DOB: Mar 29, 1930, 81 y.o.   MRN: [id number]  HPI She began to have pain in the right calf 4 months ago without specific trigger or injury. She has intermittent pain, tingling, & swelling in both calves but more pronounced on the right. On 02-03-15 the pain was described as 9 out of 10 & aching and bilateral. Elevation of the legs and wearing her husband's compression hose helped the discomfort  She had some pain an hour prior to the appointment in the right calf and numbness in toes.  She has a past history of venous stripping. Remotely she had sciatica but has not had radicular pain with the present illness.  She has past history of ITP and takes prednisone 2.5 mg every other day  She was seen by a Sports Medicine specialist and told take tart cherry, vitamin D, and turmeric. He also recommended iron supplementation.  Her vitamin D level was 44 in March of this year on no supplement. On 12/16/14 her potassium was 4.2 and calcium 8.9. Hemoglobin was 13.7 and hematocrit 41.4. Platelet count was 161,000.  She saw her Gynecologist 2-3 weeks ago. At that time she had a cough. It was recommended that she have a chest x-ray. She has no upper respiratory tract infection symptoms and the cough has essentially resolved at this time.  She has no bleeding dyscrasias   Review of Systems Epistaxis, hemoptysis, hematuria, melena, or rectal bleeding denied. Stool did get dark on iron. No unexplained weight loss, significant dyspepsia,dysphagia, or abdominal pain.  There is no abnormal bruising , bleeding, or difficulty stopping bleeding with injury. Frontal headache, facial pain , nasal purulence, dental pain, sore throat , otic pain or otic discharge denied. No fever , chills or sweats. Extrinsic symptoms of itchy, watery eyes, sneezing, or angioedema are denied. At this time there is no significant cough, sputum production, wheezing,or  paroxysmal  nocturnal dyspnea.     Objective:   Physical Exam   Pertinent or positive findings include: Arcus senilis is present. She has severe DIP and some PIP arthritic changes in the hands. There are very severe varicose veins of the lower extremities, especially over the dorsal left foot. The large vascular channels empty quickly with elevation when position. Homans sign is equivocal on the right & negative on the left.  General appearance :adequately nourished; in no distress.  Eyes: No conjunctival inflammation or scleral icterus is present.  Heart:  Normal rate and regular rhythm. S1 and S2 normal without gallop, murmur, click, rub or other extra sounds    Lungs:Chest clear to auscultation; no wheezes, rhonchi,rales ,or rubs present.No increased work of breathing.   Abdomen: bowel sounds normal, soft and non-tender without masses, organomegaly or hernias noted.  No guarding or rebound.   Vascular : Good pedal pulses;all pulses equal ; no bruits present.  Skin:Warm & dry.  Intact without suspicious lesions or rashes ; no tenting or jaundice   Lymphatic: No lymphadenopathy is noted about the head, neck, axilla.   Neuro: Strength, tone slightly decreased     Assessment & Plan:  #1 intermittent bilateral calf pain, worse on the right. This improves with compression stockings and elevation.  #2 chronic venous insufficiency, severe  #3 chronic steroid therapy for ITP.  Plan: See orders and recommendations

## 2015-01-05 NOTE — Progress Notes (Signed)
Pre visit review using our clinic review tool, if applicable. No additional management support is needed unless otherwise documented below in the visit note. 

## 2015-01-05 NOTE — Patient Instructions (Signed)
Wear over the calf support hose if you are standing for prolonged periods of  time or working on your feet for prolonged periods of time.  Your next office appointment will be determined based upon review of your pending labs.  Those written interpretation of the lab results and instructions will be transmitted to you by My Chart  Critical results will be called.   Followup as needed for any active or acute issue. Please report any significant change in your symptoms.

## 2015-01-06 LAB — D-DIMER, QUANTITATIVE: D-Dimer, Quant: 0.35 ug/mL-FEU (ref 0.00–0.48)

## 2015-01-07 ENCOUNTER — Encounter: Payer: Self-pay | Admitting: Internal Medicine

## 2015-01-07 DIAGNOSIS — D0439 Carcinoma in situ of skin of other parts of face: Secondary | ICD-10-CM | POA: Diagnosis not present

## 2015-01-07 DIAGNOSIS — L820 Inflamed seborrheic keratosis: Secondary | ICD-10-CM | POA: Diagnosis not present

## 2015-02-07 ENCOUNTER — Ambulatory Visit: Payer: Medicare Other | Admitting: Cardiology

## 2015-02-10 ENCOUNTER — Ambulatory Visit: Payer: Medicare Other | Admitting: Cardiology

## 2015-02-14 ENCOUNTER — Encounter: Payer: Self-pay | Admitting: Internal Medicine

## 2015-02-14 ENCOUNTER — Ambulatory Visit (INDEPENDENT_AMBULATORY_CARE_PROVIDER_SITE_OTHER): Payer: Medicare Other | Admitting: Internal Medicine

## 2015-02-14 VITALS — BP 146/84 | HR 72 | Temp 97.9°F | Resp 16 | Wt 144.0 lb

## 2015-02-14 DIAGNOSIS — L309 Dermatitis, unspecified: Secondary | ICD-10-CM

## 2015-02-14 DIAGNOSIS — K589 Irritable bowel syndrome without diarrhea: Secondary | ICD-10-CM | POA: Diagnosis not present

## 2015-02-14 MED ORDER — CLINDAMYCIN PHOSPHATE 1 % EX SOLN: Freq: Two times a day (BID) | CUTANEOUS | Status: DC

## 2015-02-14 NOTE — Progress Notes (Signed)
Subjective:    Patient ID: Tamara Robertson, female    DOB: Apr 28, 1930, 81 y.o.   MRN: [id number]  HPI Her husband has metastatic melanoma and will be entering Hospice. She describes marked variability in her stools varying from loose to watery or even thin in caliber. She describes some gas discomfort on the right side. These occur 1-2 times per week. She had been taking a probiotic.  Additionally she was requesting topical clindamycin for  dermatitis which she has of her face manifested as minor "pimples" intermittently. She also uses this on her toes for fungal infection. 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 Swelling of the lips or tongue or intraoral lesions denied. Shortness of breath, wheezing, or cough absent. No vesicles or urticaria noted. Fever ,chills , or sweats denied.  No dysuria, pyuria or hematuria. Unexplained weight loss, abdominal pain, significant dyspepsia, dysphagia, melena, or rectal bleeding.     Objective:   Physical Exam  General appearance is one of good health and nourishment w/o distress.  Eyes: No conjunctival inflammation or scleral icterus is present.  Oral exam: Dental hygiene is good; lips and gums are healthy appearing.There is no oropharyngeal erythema or exudate noted.   Heart:  Normal rate and regular rhythm. S1 and S2 normal without gallop, murmur, click, rub or other extra sounds     Lungs:Chest clear to auscultation; no wheezes, rhonchi,rales ,or rubs present.No increased work of breathing.   Abdomen: bowel sounds normal, soft and non-tender without masses, organomegaly or hernias noted.  No guarding or rebound . No tenderness over the flanks to percussion  Musculoskeletal: Able to lie flat and sit up without help. Negative straight leg raising bilaterally. Gait normal  Skin:Warm & dry.  Intact without suspicious lesions or rashes ; no jaundice or  tenting  Lymphatic: No lymphadenopathy is noted about the head, neck, axilla.      Assessment & Plan:  #1 Facial dermatitis, inactive at this time  #2 irritable bowel syndrome in the context of exogenous stress; her husband is terminal  See orders and after visit summary

## 2015-02-14 NOTE — Patient Instructions (Addendum)
Please take a probiotic , Florastor OR Align, every day ONLY if the bowels are loose. This will replace the normal bacteria which  are necessary for formation of normal stool and processing of food.  Immodium AD as needed for frank diarrhea

## 2015-02-14 NOTE — Progress Notes (Signed)
Pre visit review using our clinic review tool, if applicable. No additional management support is needed unless otherwise documented below in the visit note. 

## 2015-03-04 ENCOUNTER — Ambulatory Visit (INDEPENDENT_AMBULATORY_CARE_PROVIDER_SITE_OTHER): Payer: Medicare Other | Admitting: Cardiology

## 2015-03-04 ENCOUNTER — Encounter: Payer: Self-pay | Admitting: Cardiology

## 2015-03-04 VITALS — BP 128/70 | HR 70 | Ht 62.0 in | Wt 141.9 lb

## 2015-03-04 DIAGNOSIS — I10 Essential (primary) hypertension: Secondary | ICD-10-CM | POA: Diagnosis not present

## 2015-03-04 MED ORDER — ALPRAZOLAM 0.25 MG PO TABS: 0.2500 mg | ORAL_TABLET | Freq: Two times a day (BID) | ORAL | Status: DC | PRN

## 2015-03-04 NOTE — Progress Notes (Signed)
HPI The patient presents for evaluation of chest discomfort.  Since I last saw her she has had no acute cardiac complaints. Unfortunately her husband has been diagnosed with terminal metastatic lung cancer. She's undergoing quite a bit of stress as he started getting uncomfortable very difficult to care for at home. She's not had any chest pressure, neck or arm discomfort. She's had no shortness of breath. She occasionally had some elevated blood pressures.  Allergies  Allergen Reactions  . Aspirin Other (See Comments)    ITP  . Contrast Media [Iodinated Diagnostic Agents]     hives  . Statins     Current Outpatient Prescriptions  Medication Sig Dispense Refill  . ALPRAZolam (XANAX) 0.25 MG tablet Take 1 tablet (0.25 mg total) by mouth 2 (two) times daily as needed for anxiety. 20 tablet 0  . Biotin 2500 MCG CAPS Take by mouth daily.    . Calcium Citrate (CITRACAL PO) One capful three times a week    . Calcium-Magnesium-Vitamin D (CITRACAL CALCIUM+D) 600-40-500 MG-MG-UNIT TB24 Take 1 tablet by mouth daily.     . clindamycin (CLEOCIN-T) 1 % external solution Apply topically 2 (two) times daily. 30 mL 0  . diazepam (VALIUM) 5 MG tablet Take 2.5 mg by mouth once.    . dorzolamide-timolol (COSOPT) 22.3-6.8 MG/ML ophthalmic solution Place 1 drop into both eyes 2 (two) times daily.     . Flaxseed MISC by Does not apply route. Take 1 tsp daily    . fluconazole (DIFLUCAN) 150 MG tablet     . hydrocortisone 2.5 % cream     . imiquimod (ALDARA) 5 % cream Apply 1 Package topically 3 (three) times a week. Mondays, Wednesdays and Fridays in the morning  0  . Magnesium 250 MG TABS Take 250 mg by mouth daily.    . methocarbamol (ROBAXIN) 500 MG tablet Take 1 tablet (500 mg total) by mouth every 8 (eight) hours as needed for muscle spasms. 20 tablet 1  . Multiple Vitamin (MULTIVITAMIN) tablet Take 1 tablet by mouth daily.      . mupirocin ointment (BACTROBAN) 2 %     . nystatin (MYCOSTATIN/NYSTOP)  100000 UNIT/GM POWD Apply 1 g topically 2 (two) times daily. 60 g 3  . pantoprazole (PROTONIX) 20 MG tablet Take 1 tablet (20 mg total) by mouth daily. 90 tablet 3  . polyethylene glycol (MIRALAX / GLYCOLAX) packet Take 17 g by mouth every 3 (three) days.    . predniSONE (DELTASONE) 2.5 MG tablet TAKE ONE TABLET BY MOUTH ONCE DAILY 30 tablet 0  . Probiotic Product (PROBIOTIC FORMULA PO) Take 1 tablet by mouth daily.      . traMADol (ULTRAM) 50 MG tablet Take 1 tablet (50 mg total) by mouth every 6 (six) hours as needed for moderate pain. 60 tablet 1   No current facility-administered medications for this visit.    Past Medical History  Diagnosis Date  . OSTEOARTHRITIS, KNEE, RIGHT   . URINARY INCONTINENCE   . Irritable bowel syndrome   . DIVERTICULITIS, HX OF   . OSTEOPENIA   . Immune thrombocytopenic purpura (HCC)     chronic - baseline 80-100K, on pred  . UNSPECIFIED PERIPHERAL VASCULAR DISEASE   . OVERACTIVE BLADDER   . DYSLIPIDEMIA   . Glaucoma   . GERD   . HOH (hard of hearing)   . CAD (coronary artery disease)     RCA 40% stenosis cath 01/2011  . Left ovarian cyst dx 01/2013 CT  working with gyn, ?malignant - elevated tumor marker OVA1  . Cholelithiasis     s/p lap chole 09/2014  . Elevated LFTs   . Hepatic steatosis   . Anxiety     Past Surgical History  Procedure Laterality Date  . Splenectomy  1954  . Abdominal hysterectomy  1963  . L pop pta  10/2009    stent  . Laparoscopic sigmoid colectomy  10/2005  . Cataract extraction, bilateral  10/2010  . Knee arthroscopy Right   . Angioplasty    . Cardiac catheterization    . Cholecystectomy N/A 09/16/2014    Procedure: LAPAROSCOPIC CHOLECYSTECTOMY ;  Surgeon: Rolm Bookbinder, MD;  Location: [facility];  Service: General;  Laterality: N/A;  . Appendectomy  1956    ROS:  As stated in the HPI and negative for all other systems.   PHYSICAL EXAM BP 128/70 mmHg  Pulse 70  Ht 5\' 2"  (1.575 m)  Wt 141 lb 14.4 oz (64.365  kg)  BMI 25.95 kg/m2 GENERAL:  Well appearing NECK:  No jugular venous distention, waveform within normal limits, carotid upstroke brisk and symmetric, no bruits, no thyromegaly LUNGS:  Clear to auscultation bilaterally CHEST:  Unremarkable HEART:  PMI not displaced or sustained,S1 and S2 within normal limits, no S3, no S4, no clicks, no rubs, no murmurs ABD:  Flat, positive bowel sounds normal in frequency in pitch, no bruits, no rebound, no guarding, no midline pulsatile mass, no hepatomegaly, no splenomegaly EXT:  2 plus pulses throughout, no edema, no cyanosis no clubbing  EKG: Sinus rhythm, axis within normal limits, intervals within normal limits, T wave changes.  03/04/2015  ASSESSMENT AND PLAN  HTN - She has had no new symptoms since her stress test and follow normal. No change in therapy is indicated.   DYSLIPIDEMIA -  She does not want to take statins.  No change in therapy is indicated.    ANXIETY - I sent a note off to her husband's oncologist to see if we could get him into inpatient hospice. I did take the liberty of prescribing her alprazolam.

## 2015-03-04 NOTE — Patient Instructions (Addendum)
Your physician wants you to follow-up in: 6 Months You will receive a reminder letter in the mail two months in advance. If you don't receive a letter, please call our office to schedule the follow-up appointment.  

## 2015-03-10 ENCOUNTER — Encounter (HOSPITAL_COMMUNITY): Payer: Self-pay | Admitting: Emergency Medicine

## 2015-03-10 ENCOUNTER — Emergency Department (HOSPITAL_COMMUNITY): Payer: Medicare Other

## 2015-03-10 ENCOUNTER — Emergency Department (HOSPITAL_COMMUNITY)
Admission: EM | Admit: 2015-03-10 | Discharge: 2015-03-11 | Disposition: A | Discharge status: Home / Self Care | Payer: Medicare Other | Attending: Emergency Medicine | Admitting: Emergency Medicine

## 2015-03-10 DIAGNOSIS — Z87448 Personal history of other diseases of urinary system: Secondary | ICD-10-CM | POA: Insufficient documentation

## 2015-03-10 DIAGNOSIS — K219 Gastro-esophageal reflux disease without esophagitis: Secondary | ICD-10-CM | POA: Insufficient documentation

## 2015-03-10 DIAGNOSIS — S0093XA Contusion of unspecified part of head, initial encounter: Secondary | ICD-10-CM | POA: Insufficient documentation

## 2015-03-10 DIAGNOSIS — F419 Anxiety disorder, unspecified: Secondary | ICD-10-CM | POA: Insufficient documentation

## 2015-03-10 DIAGNOSIS — Z8742 Personal history of other diseases of the female genital tract: Secondary | ICD-10-CM | POA: Insufficient documentation

## 2015-03-10 DIAGNOSIS — Y92009 Unspecified place in unspecified non-institutional (private) residence as the place of occurrence of the external cause: Secondary | ICD-10-CM | POA: Insufficient documentation

## 2015-03-10 DIAGNOSIS — R519 Headache, unspecified: Secondary | ICD-10-CM

## 2015-03-10 DIAGNOSIS — H409 Unspecified glaucoma: Secondary | ICD-10-CM | POA: Diagnosis not present

## 2015-03-10 DIAGNOSIS — H9190 Unspecified hearing loss, unspecified ear: Secondary | ICD-10-CM | POA: Diagnosis not present

## 2015-03-10 DIAGNOSIS — Y9389 Activity, other specified: Secondary | ICD-10-CM | POA: Insufficient documentation

## 2015-03-10 DIAGNOSIS — I2510 Atherosclerotic heart disease of native coronary artery without angina pectoris: Secondary | ICD-10-CM | POA: Insufficient documentation

## 2015-03-10 DIAGNOSIS — S0083XA Contusion of other part of head, initial encounter: Secondary | ICD-10-CM | POA: Diagnosis not present

## 2015-03-10 DIAGNOSIS — T148XXA Other injury of unspecified body region, initial encounter: Secondary | ICD-10-CM

## 2015-03-10 DIAGNOSIS — Z9861 Coronary angioplasty status: Secondary | ICD-10-CM | POA: Diagnosis not present

## 2015-03-10 DIAGNOSIS — Z9889 Other specified postprocedural states: Secondary | ICD-10-CM | POA: Diagnosis not present

## 2015-03-10 DIAGNOSIS — Z862 Personal history of diseases of the blood and blood-forming organs and certain disorders involving the immune mechanism: Secondary | ICD-10-CM | POA: Insufficient documentation

## 2015-03-10 DIAGNOSIS — W01198A Fall on same level from slipping, tripping and stumbling with subsequent striking against other object, initial encounter: Secondary | ICD-10-CM | POA: Diagnosis not present

## 2015-03-10 DIAGNOSIS — W19XXXA Unspecified fall, initial encounter: Secondary | ICD-10-CM

## 2015-03-10 DIAGNOSIS — Y998 Other external cause status: Secondary | ICD-10-CM | POA: Diagnosis not present

## 2015-03-10 DIAGNOSIS — S0990XA Unspecified injury of head, initial encounter: Secondary | ICD-10-CM | POA: Diagnosis not present

## 2015-03-10 DIAGNOSIS — Z792 Long term (current) use of antibiotics: Secondary | ICD-10-CM | POA: Diagnosis not present

## 2015-03-10 DIAGNOSIS — E785 Hyperlipidemia, unspecified: Secondary | ICD-10-CM | POA: Diagnosis not present

## 2015-03-10 DIAGNOSIS — Z79899 Other long term (current) drug therapy: Secondary | ICD-10-CM | POA: Insufficient documentation

## 2015-03-10 DIAGNOSIS — R51 Headache: Secondary | ICD-10-CM | POA: Diagnosis not present

## 2015-03-10 DIAGNOSIS — M1711 Unilateral primary osteoarthritis, right knee: Secondary | ICD-10-CM | POA: Diagnosis not present

## 2015-03-10 MED ORDER — ACETAMINOPHEN 325 MG PO TABS
650.0000 mg | ORAL_TABLET | Freq: Once | ORAL | Status: AC
  Administered 2015-03-10: 650 mg via ORAL
  Filled 2015-03-10: qty 2

## 2015-03-10 NOTE — ED Notes (Signed)
Bed: HD39 Expected date:  Expected time:  Means of arrival:  Comments: EMS 81yo F, fall hematoma to head

## 2015-03-10 NOTE — Discharge Instructions (Signed)
-   Use tylenol or advil for symptom control.  - May use ice for symptom control.  - Return to ED with worsening headache, confusion, changes in vision, numbness or weakness in the extremities, nausea, vomiting or other new or concerning symptoms  General Headache Without Cause A headache is pain or discomfort felt around the head or neck area. The specific cause of a headache may not be found. There are many causes and types of headaches. A few common ones are:  Tension headaches.  Migraine headaches.  Cluster headaches.  Chronic daily headaches. HOME CARE INSTRUCTIONS  Watch your condition for any changes. Take these steps to help with your condition: Managing Pain  Take over-the-counter and prescription medicines only as told by your health care provider.  Lie down in a dark, quiet room when you have a headache.  If directed, apply ice to the head and neck area:  Put ice in a plastic bag.  Place a towel between your skin and the bag.  Leave the ice on for 20 minutes, 2-3 times per day.  Use a heating pad or hot shower to apply heat to the head and neck area as told by your health care provider.  Keep lights dim if bright lights bother you or make your headaches worse. Eating and Drinking  Eat meals on a regular schedule.  Limit alcohol use.  Decrease the amount of caffeine you drink, or stop drinking caffeine. General Instructions  Keep all follow-up visits as told by your health care provider. This is important.  Keep a headache journal to help find out what may trigger your headaches. For example, write down:  What you eat and drink.  How much sleep you get.  Any change to your diet or medicines.  Try massage or other relaxation techniques.  Limit stress.  Sit up straight, and do not tense your muscles.  Do not use tobacco products, including cigarettes, chewing tobacco, or e-cigarettes. If you need help quitting, ask your health care provider.  Exercise  regularly as told by your health care provider.  Sleep on a regular schedule. Get 7-9 hours of sleep, or the amount recommended by your health care provider. SEEK MEDICAL CARE IF:   Your symptoms are not helped by medicine.  You have a headache that is different from the usual headache.  You have nausea or you vomit.  You have a fever. SEEK IMMEDIATE MEDICAL CARE IF:   Your headache becomes severe.  You have repeated vomiting.  You have a stiff neck.  You have a loss of vision.  You have problems with speech.  You have pain in the eye or ear.  You have muscular weakness or loss of muscle control.  You lose your balance or have trouble walking.  You feel faint or pass out.  You have confusion.   This information is not intended to replace advice given to you by your health care provider. Make sure you discuss any questions you have with your health care provider.   Document Released: 04/23/2005 Document Revised: 01/12/2015 Document Reviewed: 08/16/2014 Elsevier Interactive Patient Education [employer].

## 2015-03-10 NOTE — ED Provider Notes (Signed)
CSN: [id number]     Arrival date & time 03/10/15  2034 History   First MD Initiated Contact with Patient 03/10/15 2045     No chief complaint on file.  HPI  Ms. Topham is an 81 year old female with past medical history of osteoarthritis presenting after a fall. Patient reports that she was closing the door and heard the phone ring. She turned around too quickly and her shoes lost their grip and she fell to the ground. She fell onto her left side and hit her head on the door frame. She denies loss of consciousness. She was able to stand up and ambulate before EMS arrival. She is currently complaining of a generalized headache and "lump" to her posterior head. Denies any blood from her head. Denies other injuries sustained in the fall. She has not taken any over-the-counter pain relievers as of yet. Denies dizziness, lightheadedness, blurred vision, vision loss, neck pain, arthralgias or myalgias. She denies chest pain, shortness of breath, diaphoresis, nausea before the fall or immediately after the fall. Denies anticoagulants.  Past Medical History  Diagnosis Date  . OSTEOARTHRITIS, KNEE, RIGHT   . URINARY INCONTINENCE   . Irritable bowel syndrome   . DIVERTICULITIS, HX OF   . OSTEOPENIA   . Immune thrombocytopenic purpura (HCC)     chronic - baseline 80-100K, on pred  . UNSPECIFIED PERIPHERAL VASCULAR DISEASE   . OVERACTIVE BLADDER   . DYSLIPIDEMIA   . Glaucoma   . GERD   . HOH (hard of hearing)   . CAD (coronary artery disease)     RCA 40% stenosis cath 01/2011  . Left ovarian cyst dx 01/2013 CT    working with gyn, ?malignant - elevated tumor marker OVA1  . Cholelithiasis     s/p lap chole 09/2014  . Elevated LFTs   . Hepatic steatosis   . Anxiety    Past Surgical History  Procedure Laterality Date  . Splenectomy  1954  . Abdominal hysterectomy  1963  . L pop pta  10/2009    stent  . Laparoscopic sigmoid colectomy  10/2005  . Cataract extraction, bilateral  10/2010  . Knee  arthroscopy Right   . Angioplasty    . Cardiac catheterization    . Cholecystectomy N/A 09/16/2014    Procedure: LAPAROSCOPIC CHOLECYSTECTOMY ;  Surgeon: Rolm Bookbinder, MD;  Location: [facility];  Service: General;  Laterality: N/A;  . Appendectomy  1956   Family History  Problem Relation Age of Onset  . Coronary artery disease Mother   . Heart attack Mother 53  . Hyperlipidemia Mother   . Hypertension Mother   . Stomach cancer Father   . Hypertension Daughter   . Hyperlipidemia Daughter   . Arthritis      parent  . Transient ischemic attack      parent  . Colon cancer Neg Hx    Social History  Substance Use Topics  . Smoking status: Never Smoker   . Smokeless tobacco: Never Used  . Alcohol Use: No     Comment: rarely   OB History    No data available     Review of Systems  Constitutional: Negative for diaphoresis.  Eyes: Negative for visual disturbance.  Respiratory: Negative for shortness of breath.   Cardiovascular: Negative for chest pain.  Gastrointestinal: Negative for nausea, vomiting and abdominal pain.  Musculoskeletal: Negative for myalgias, back pain and arthralgias.  Skin: Negative for wound.  Neurological: Positive for headaches. Negative for dizziness, syncope, weakness,  light-headedness and numbness.  Hematological: Does not bruise/bleed easily.  Psychiatric/Behavioral: Negative for confusion.  All other systems reviewed and are negative.     Allergies  Aspirin; Contrast media; and Statins  Home Medications   Prior to Admission medications   Medication Sig Start Date End Date Taking? Authorizing Provider  ALPRAZolam (XANAX) 0.25 MG tablet Take 1 tablet (0.25 mg total) by mouth 2 (two) times daily as needed for anxiety. 03/04/15   [name], MD  Biotin 2500 MCG CAPS Take by mouth daily.    Historical Provider, MD  Calcium Citrate (CITRACAL PO) One capful three times a week    Historical Provider, MD  Calcium-Magnesium-Vitamin D (CITRACAL  CALCIUM+D) 600-40-500 MG-MG-UNIT TB24 Take 1 tablet by mouth daily.     Historical Provider, MD  clindamycin (CLEOCIN-T) 1 % external solution Apply topically 2 (two) times daily. 02/14/15   Hendricks Limes, MD  diazepam (VALIUM) 5 MG tablet Take 2.5 mg by mouth once.    Historical Provider, MD  dorzolamide-timolol (COSOPT) 22.3-6.8 MG/ML ophthalmic solution Place 1 drop into both eyes 2 (two) times daily.  12/30/12   Historical Provider, MD  Flaxseed MISC by Does not apply route. Take 1 tsp daily    Historical Provider, MD  fluconazole (DIFLUCAN) 150 MG tablet  10/05/14   Historical Provider, MD  hydrocortisone 2.5 % cream  01/01/15   Historical Provider, MD  imiquimod (ALDARA) 5 % cream Apply 1 Package topically 3 (three) times a week. Mondays, Wednesdays and Fridays in the morning 01/11/15   Historical Provider, MD  Magnesium 250 MG TABS Take 250 mg by mouth daily.    Historical Provider, MD  methocarbamol (ROBAXIN) 500 MG tablet Take 1 tablet (500 mg total) by mouth every 8 (eight) hours as needed for muscle spasms. 11/22/14   Rowe Clack, MD  Multiple Vitamin (MULTIVITAMIN) tablet Take 1 tablet by mouth daily.      Historical Provider, MD  mupirocin ointment (BACTROBAN) 2 %  12/30/14   Historical Provider, MD  nystatin (MYCOSTATIN/NYSTOP) 100000 UNIT/GM POWD Apply 1 g topically 2 (two) times daily. 10/01/13   Rowe Clack, MD  pantoprazole (PROTONIX) 20 MG tablet Take 1 tablet (20 mg total) by mouth daily. 08/12/14   Jerene Bears, MD  polyethylene glycol (MIRALAX / Floria Raveling) packet Take 17 g by mouth every 3 (three) days.    Historical Provider, MD  predniSONE (DELTASONE) 2.5 MG tablet TAKE ONE TABLET BY MOUTH ONCE DAILY 01/05/15   Rowe Clack, MD  Probiotic Product (PROBIOTIC FORMULA PO) Take 1 tablet by mouth daily.      Historical Provider, MD  traMADol (ULTRAM) 50 MG tablet Take 1 tablet (50 mg total) by mouth every 6 (six) hours as needed for moderate pain. 09/20/14   Rolm Bookbinder, MD   BP 156/65 mmHg  Pulse 79  Temp(Src) 98.2 F (36.8 C) (Oral)  Resp 18  SpO2 100% Physical Exam  Constitutional: She is oriented to person, place, and time. She appears well-developed and well-nourished. No distress.  HENT:  Head: Normocephalic and atraumatic.  Mouth/Throat: Oropharynx is clear and moist. No oropharyngeal exudate.  Eyes: Conjunctivae and EOM are normal. Pupils are equal, round, and reactive to light. Right eye exhibits no discharge. Left eye exhibits no discharge. No scleral icterus.  Neck: Normal range of motion. Neck supple.  Neck is nontender to palpation. Full range of motion intact. No cervical spine bony deformities or step-offs.  Cardiovascular: Normal rate, regular rhythm and  normal heart sounds.   Pulmonary/Chest: Effort normal and breath sounds normal. No respiratory distress. She has no wheezes. She has no rales.  Abdominal: Soft. There is no tenderness.  Musculoskeletal: Normal range of motion.  Pt moves all extremities spontaneously. No obvious deformities.  Neurological: She is alert and oriented to person, place, and time. No cranial nerve deficit. Coordination normal.  GCS 15. Cranial nerves 3-12 intact. Major muscle groups with 5/5 motor strength. Sensation to light touch intact. Coordinated finger to nose.   Skin: Skin is warm and dry.  No laceration, abrasion or ecchymosis noted. Hematoma to posterior occiput.  Psychiatric: She has a normal mood and affect. Her behavior is normal.  Nursing note and vitals reviewed.   ED Course  Procedures (including critical care time) Labs Review Labs Reviewed - No data to display  Imaging Review Ct Head Wo Contrast  03/10/2015  CLINICAL DATA:  81 year old who tripped on a rug at home earlier this evening, falling and striking the top of the head on a door frame. No loss of consciousness. Initial encounter. EXAM: CT HEAD WITHOUT CONTRAST TECHNIQUE: Contiguous axial images were obtained from the  base of the skull through the vertex without intravenous contrast. COMPARISON:  None. FINDINGS: Ventricular system normal in size and appearance for age. Very mild cortical atrophy, age appropriate. Moderate changes of small vessel disease of the white matter. No mass lesion. No midline shift. No acute hemorrhage or hematoma. No extra-axial fluid collections. No evidence of acute infarction. Large left parietal scalp hematoma at the vertex without underlying skull fracture. Mucous retention cyst or polyp in the right sphenoid sinus. Remaining visualized paranasal sinuses, bilateral mastoid air cells and bilateral middle ear cavities well aerated. Mild bilateral carotid siphon atherosclerosis. IMPRESSION: 1. No acute or significant intracranial abnormality for age. 2. Large left parietal scalp hematoma at the vertex without underlying skull fracture. Electronically Signed   By: Evangeline Dakin M.D.   On: 03/10/2015 22:26   I have personally reviewed and evaluated these images and lab results as part of my medical decision-making.   EKG Interpretation None      MDM   Final diagnoses:  Fall, initial encounter  Headache, unspecified headache type  Hematoma   Patient presenting after a fall. He lost her footing and fell backwards into a door frame hitting her head. Denies loss of consciousness. Denies other injuries sustained in the fall. She was ambulatory immediately following the fall. Currently complaining of generalized, throbbing headache. Pain controlled with Tylenol and ice in the emergency department. Nonfocal neurological exam. Moves all extremities without pain. No obvious deformities. Large hematoma noted to the posterior occiput. No laceration, abrasion or ecchymosis present. CT head negative. Discussed at-home pain relief with conservative therapy including Tylenol, Advil and ice.Return precautions given in discharge paperwork and discussed with pt at bedside. Pt stable for  discharge     Josephina Gip, PA-C 03/11/15 0017

## 2015-03-10 NOTE — ED Notes (Signed)
Patient presents via EMS for fall. Hematoma to top of head, no LOC, no anticoagulants.   Last VS: 162/82, 82hr, 16resp

## 2015-03-10 NOTE — ED Provider Notes (Signed)
Medical screening examination/treatment/procedure(s) were conducted as a shared visit with non-physician practitioner(s) and myself.  I personally evaluated the patient during the encounter.   EKG Interpretation None      Results for orders placed or performed in visit on 01/05/15  CK  Result Value Ref Range   Total CK 70 7 - 177 U/L  D-dimer, quantitative (not at [facility])  Result Value Ref Range   D-Dimer, Quant 0.35 0.00 - 0.48 ug/mL-FEU  Magnesium  Result Value Ref Range   Magnesium 2.2 1.5 - 2.5 mg/dL   Ct Head Wo Contrast  03/10/2015  CLINICAL DATA:  81 year old who tripped on a rug at home earlier this evening, falling and striking the top of the head on a door frame. No loss of consciousness. Initial encounter. EXAM: CT HEAD WITHOUT CONTRAST TECHNIQUE: Contiguous axial images were obtained from the base of the skull through the vertex without intravenous contrast. COMPARISON:  None. FINDINGS: Ventricular system normal in size and appearance for age. Very mild cortical atrophy, age appropriate. Moderate changes of small vessel disease of the white matter. No mass lesion. No midline shift. No acute hemorrhage or hematoma. No extra-axial fluid collections. No evidence of acute infarction. Large left parietal scalp hematoma at the vertex without underlying skull fracture. Mucous retention cyst or polyp in the right sphenoid sinus. Remaining visualized paranasal sinuses, bilateral mastoid air cells and bilateral middle ear cavities well aerated. Mild bilateral carotid siphon atherosclerosis. IMPRESSION: 1. No acute or significant intracranial abnormality for age. 2. Large left parietal scalp hematoma at the vertex without underlying skull fracture. Electronically Signed   By: Evangeline Dakin M.D.   On: 03/10/2015 22:26    Patient seen by me. Patient with a fall at home hit her head on the door jam she turned and twisted in her proper shoe got stuck and she fell down. No loss of consciousness.  Patient is not on any anticoagulants. Patient's complaint is to the top of her head where she has swelling. On exam there is about a 3 x 4 cm hematoma with some tenderness no active bleeding or abrasion. His pre-much on top of the head with a little bit more to the left.  Head CT shows no evidence of any acute injury. Patient without any other complaints is some mild neck stiffness but no posterior midline tenderness and good range of motion of the neck. Patient has no complaint of any hip or leg pain or arm pain.  Patient has tramadol at home that she can take. Patient is stable for discharge home.  Fredia Sorrow, MD 03/10/15 [phone]

## 2015-03-12 DIAGNOSIS — L03211 Cellulitis of face: Secondary | ICD-10-CM | POA: Diagnosis not present

## 2015-03-16 ENCOUNTER — Encounter: Payer: Self-pay | Admitting: Internal Medicine

## 2015-03-16 ENCOUNTER — Other Ambulatory Visit (INDEPENDENT_AMBULATORY_CARE_PROVIDER_SITE_OTHER): Payer: Medicare Other

## 2015-03-16 ENCOUNTER — Ambulatory Visit: Payer: Medicare Other | Admitting: Internal Medicine

## 2015-03-16 ENCOUNTER — Ambulatory Visit (INDEPENDENT_AMBULATORY_CARE_PROVIDER_SITE_OTHER): Payer: Medicare Other | Admitting: Internal Medicine

## 2015-03-16 ENCOUNTER — Other Ambulatory Visit: Payer: Self-pay | Admitting: Internal Medicine

## 2015-03-16 VITALS — BP 138/70 | HR 82 | Temp 97.6°F | Ht 62.0 in | Wt 141.0 lb

## 2015-03-16 DIAGNOSIS — R21 Rash and other nonspecific skin eruption: Secondary | ICD-10-CM

## 2015-03-16 DIAGNOSIS — D693 Immune thrombocytopenic purpura: Secondary | ICD-10-CM

## 2015-03-16 DIAGNOSIS — S0093XD Contusion of unspecified part of head, subsequent encounter: Secondary | ICD-10-CM | POA: Diagnosis not present

## 2015-03-16 LAB — CBC WITH DIFFERENTIAL/PLATELET
BASOS PCT: 0.5 % (ref 0.0–3.0)
Basophils Absolute: 0.1 10*3/uL (ref 0.0–0.1)
EOS ABS: 0.2 10*3/uL (ref 0.0–0.7)
EOS PCT: 1.7 % (ref 0.0–5.0)
HCT: 41.5 % (ref 36.0–46.0)
HEMOGLOBIN: 13.7 g/dL (ref 12.0–15.0)
LYMPHS ABS: 4.6 10*3/uL — AB (ref 0.7–4.0)
Lymphocytes Relative: 32.5 % (ref 12.0–46.0)
MCHC: 33 g/dL (ref 30.0–36.0)
MCV: 85.8 fl (ref 78.0–100.0)
MONO ABS: 1.6 10*3/uL — AB (ref 0.1–1.0)
Monocytes Relative: 11.4 % (ref 3.0–12.0)
NEUTROS PCT: 53.9 % (ref 43.0–77.0)
Neutro Abs: 7.6 10*3/uL (ref 1.4–7.7)
Platelets: 168 10*3/uL (ref 150.0–400.0)
RBC: 4.84 Mil/uL (ref 3.87–5.11)
RDW: 13.7 % (ref 11.5–15.5)
WBC: 14.1 10*3/uL — AB (ref 4.0–10.5)

## 2015-03-16 MED ORDER — CLOTRIMAZOLE-BETAMETHASONE 1-0.05 % EX CREA: 1.0000 "application " | TOPICAL_CREAM | Freq: Two times a day (BID) | CUTANEOUS | Status: DC | PRN

## 2015-03-16 NOTE — Assessment & Plan Note (Signed)
Last flare 06/2011 precipitated by URI S/p heme eval 07/2011 for same S/p dose pak burst with resolution, return to baseline-  on low dose maintainence pred chronically, no change Given size of traumatic hematomas on left scalp and left thigh, recheck CBC at this time to exclude new flare

## 2015-03-16 NOTE — Progress Notes (Signed)
Subjective:    Patient ID: Tamara Robertson, female    DOB: Jun 03, 1929, 81 y.o.   MRN: [id number]  HPI  Patient here for follow-up. Reviewed chronic medical issues and interval events Spouse passed November 3 after battle with terminal cancer. On same day of spouse's death, patient fell against a door frame and her home helping neighbor exit. Transported by EMS to the emergency room where she was evaluated for same. Head CT reviewed. Since then mild lightheadedness with local headache, no change in vision or excessive sedation  Past Medical History  Diagnosis Date  . OSTEOARTHRITIS, KNEE, RIGHT   . URINARY INCONTINENCE   . Irritable bowel syndrome   . DIVERTICULITIS, HX OF   . OSTEOPENIA   . Immune thrombocytopenic purpura (HCC)     chronic - baseline 80-100K, on pred  . UNSPECIFIED PERIPHERAL VASCULAR DISEASE   . OVERACTIVE BLADDER   . DYSLIPIDEMIA   . Glaucoma   . GERD   . HOH (hard of hearing)   . CAD (coronary artery disease)     RCA 40% stenosis cath 01/2011  . Left ovarian cyst dx 01/2013 CT    working with gyn, ?malignant - elevated tumor marker OVA1  . Cholelithiasis     s/p lap chole 09/2014  . Elevated LFTs   . Hepatic steatosis   . Anxiety     Review of Systems  Constitutional: Negative for fever, fatigue and unexpected weight change.  Respiratory: Negative for shortness of breath and wheezing.   Cardiovascular: Negative for chest pain, palpitations and leg swelling.  Neurological: Positive for light-headedness. Negative for dizziness, syncope, facial asymmetry, speech difficulty and weakness.  Psychiatric/Behavioral: Negative for decreased concentration.       Objective:    Physical Exam  Constitutional: She appears well-developed and well-nourished. No distress.  Cardiovascular: Normal rate, regular rhythm and normal heart sounds.   No murmur heard. Pulmonary/Chest: Effort normal and breath sounds normal. No respiratory distress.  Musculoskeletal: She  exhibits no edema.  Skin: Rash (Under L breast fold -hive-like appearance with confluent erythema, potential candidiasis versus contact dermatitis?) noted.  2 discrete hematoma on left posterior scalp near crown. No laceration, abnormal warmth, erythema or excessive tenderness. Also intramuscular hematoma appreciated on left lateral/posterior thigh  Psychiatric: She has a normal mood and affect. Her behavior is normal. Judgment and thought content normal.    BP 138/70 mmHg  Pulse 82  Temp(Src) 97.6 F (36.4 C) (Oral)  Ht 5\' 2"  (1.575 m)  Wt 141 lb (63.957 kg)  BMI 25.78 kg/m2  SpO2 99% Wt Readings from Last 3 Encounters:  03/16/15 141 lb (63.957 kg)  03/04/15 141 lb 14.4 oz (64.365 kg)  02/14/15 144 lb (65.318 kg)     Lab Results  Component Value Date   WBC 10.3 12/16/2014   HGB 13.7 12/16/2014   HCT 41.4 12/16/2014   PLT 161 12/16/2014   GLUCOSE 89 12/16/2014   CHOL 215* 06/22/2014   TRIG 161* 06/22/2014   HDL 51 06/22/2014   LDLDIRECT 155.9 03/19/2014   LDLCALC 132* 06/22/2014   ALT 16 12/16/2014   AST 23 12/16/2014   NA 141 12/16/2014   K 4.2 12/16/2014   CL 100* 09/20/2014   CREATININE 0.9 12/16/2014   BUN 12.0 12/16/2014   CO2 28 12/16/2014   TSH 2.95 06/03/2012   INR 1.1* 02/16/2011   HGBA1C 5.8 03/19/2014    Ct Head Wo Contrast  03/10/2015  CLINICAL DATA:  81 year old who tripped on a  rug at home earlier this evening, falling and striking the top of the head on a door frame. No loss of consciousness. Initial encounter. EXAM: CT HEAD WITHOUT CONTRAST TECHNIQUE: Contiguous axial images were obtained from the base of the skull through the vertex without intravenous contrast. COMPARISON:  None. FINDINGS: Ventricular system normal in size and appearance for age. Very mild cortical atrophy, age appropriate. Moderate changes of small vessel disease of the white matter. No mass lesion. No midline shift. No acute hemorrhage or hematoma. No extra-axial fluid collections.  No evidence of acute infarction. Large left parietal scalp hematoma at the vertex without underlying skull fracture. Mucous retention cyst or polyp in the right sphenoid sinus. Remaining visualized paranasal sinuses, bilateral mastoid air cells and bilateral middle ear cavities well aerated. Mild bilateral carotid siphon atherosclerosis. IMPRESSION: 1. No acute or significant intracranial abnormality for age. 2. Large left parietal scalp hematoma at the vertex without underlying skull fracture. Electronically Signed   By: Hulan Saas M.D.   On: 03/10/2015 22:26       Assessment & Plan:   Accidental fall on 11/3 reviewed. Significant scalp and thigh hematoma associated with same. ER evaluation following event included head CT which is reviewed showing no skull fracture and no subdural hematoma or intracranial hemorrhage.  Rash below L breast - ineffective treatment with nystatin powder, hydrocortisone from derm - will try combo cream  Problem List Items Addressed This Visit    IMMUNE THROMBOCYTOPENIC PURPURA    Last flare 06/2011 precipitated by URI S/p heme eval 07/2011 for same S/p dose pak burst with resolution, return to baseline-  on low dose maintainence pred chronically, no change Given size of traumatic hematomas on left scalp and left thigh, recheck CBC at this time to exclude new flare      Relevant Orders   CBC with Differential/Platelet    Other Visit Diagnoses    Traumatic hematoma of head, subsequent encounter    -  Primary        Rene Paci, MD

## 2015-03-16 NOTE — Patient Instructions (Signed)
It was good to see you today.  We have reviewed your prior records including labs and tests today  Test(s) ordered today. Your results will be released to [place] (or called to you) after review, usually within 72hours after test completion. If any changes need to be made, you will be notified at that same time.  Medications reviewed and updated Try new cream for your rash in place of powder and hydrocortisone. Lotrisone combination sent to your local pharmacy. No other medication changes recommended at this time.  Please schedule followup in 3-4 months, call sooner if problems.

## 2015-03-18 ENCOUNTER — Other Ambulatory Visit: Payer: Self-pay | Admitting: Internal Medicine

## 2015-03-25 ENCOUNTER — Other Ambulatory Visit: Payer: Self-pay | Admitting: Gynecologic Oncology

## 2015-03-25 DIAGNOSIS — R1030 Lower abdominal pain, unspecified: Secondary | ICD-10-CM

## 2015-03-25 DIAGNOSIS — N83202 Unspecified ovarian cyst, left side: Secondary | ICD-10-CM

## 2015-04-05 DIAGNOSIS — H16223 Keratoconjunctivitis sicca, not specified as Sjogren's, bilateral: Secondary | ICD-10-CM | POA: Diagnosis not present

## 2015-04-06 ENCOUNTER — Ambulatory Visit (HOSPITAL_COMMUNITY)
Admission: RE | Admit: 2015-04-06 | Discharge: 2015-04-06 | Disposition: A | Discharge status: Home / Self Care | Payer: Medicare Other | Source: Ambulatory Visit | Attending: Gynecologic Oncology | Admitting: Gynecologic Oncology

## 2015-04-06 DIAGNOSIS — Z90710 Acquired absence of both cervix and uterus: Secondary | ICD-10-CM | POA: Insufficient documentation

## 2015-04-06 DIAGNOSIS — R1030 Lower abdominal pain, unspecified: Secondary | ICD-10-CM | POA: Diagnosis not present

## 2015-04-06 DIAGNOSIS — N83202 Unspecified ovarian cyst, left side: Secondary | ICD-10-CM

## 2015-04-12 ENCOUNTER — Other Ambulatory Visit: Payer: Self-pay | Admitting: Gynecologic Oncology

## 2015-04-12 ENCOUNTER — Telehealth: Payer: Self-pay | Admitting: *Deleted

## 2015-04-12 DIAGNOSIS — N83299 Other ovarian cyst, unspecified side: Secondary | ICD-10-CM

## 2015-04-12 DIAGNOSIS — N83202 Unspecified ovarian cyst, left side: Secondary | ICD-10-CM

## 2015-04-12 NOTE — Progress Notes (Signed)
Patient notified of ultrasound results by Dr. Alycia Rossetti.  Recommendations for a repeat ultrasound in three months.  Also advised to cancel her upcoming appt with Dr. Alycia Rossetti since the results/recommendations were discussed over the phone.

## 2015-04-12 NOTE — Telephone Encounter (Signed)
Notified pt of scheduled U/S appointment on March 2,2016 @ 11:00.am. Pt was advised to arrive @ 10:45 with a full bladder. Pt agreed with time and date of appointment

## 2015-04-20 ENCOUNTER — Telehealth: Payer: Self-pay | Admitting: Internal Medicine

## 2015-04-20 NOTE — Telephone Encounter (Signed)
Pt states she has been seeing her gyn-onc for cysts on her ovary. Pt had [place] recently and states her gyn-onc suggested she see GI. Pt states she has pain on her left side and that sometimes it goes across to her right side. Also states her bowel movements are going back and forth between loose and constipation, not regular. Wonders if could be diverticulitis.Pt requested to see Dr. Hilarie Fredrickson but 1st available is in Feb., no App appts until after Christmas. Please advise.

## 2015-04-20 NOTE — Telephone Encounter (Signed)
Would recommend CBC and BMET If creatinine normal would recommend CT scan abdomen and pelvis with contrast rule out diverticulitis Treatment decisions to be based on these results (if negative for diverticulitis would recommend increasing MiraLAX to 17 g daily and adding Benefiber 1 tablespoon daily, if not diverticulitis perhaps she is having predominantly constipation with incomplete colonic evacuation leading to symptoms) Office follow-up with me or APP for continuity

## 2015-04-21 ENCOUNTER — Encounter: Payer: Self-pay | Admitting: Internal Medicine

## 2015-04-21 ENCOUNTER — Other Ambulatory Visit (INDEPENDENT_AMBULATORY_CARE_PROVIDER_SITE_OTHER): Payer: Medicare Other

## 2015-04-21 ENCOUNTER — Ambulatory Visit (INDEPENDENT_AMBULATORY_CARE_PROVIDER_SITE_OTHER): Payer: Medicare Other | Admitting: Internal Medicine

## 2015-04-21 VITALS — BP 150/80 | HR 75 | Temp 97.4°F | Ht 62.0 in | Wt 141.0 lb

## 2015-04-21 DIAGNOSIS — R1032 Left lower quadrant pain: Secondary | ICD-10-CM

## 2015-04-21 DIAGNOSIS — K582 Mixed irritable bowel syndrome: Secondary | ICD-10-CM | POA: Diagnosis not present

## 2015-04-21 DIAGNOSIS — N83202 Unspecified ovarian cyst, left side: Secondary | ICD-10-CM | POA: Diagnosis not present

## 2015-04-21 LAB — CBC WITH DIFFERENTIAL/PLATELET
BASOS ABS: 0 10*3/uL (ref 0.0–0.1)
Basophils Relative: 0.4 % (ref 0.0–3.0)
EOS ABS: 0.2 10*3/uL (ref 0.0–0.7)
Eosinophils Relative: 1.6 % (ref 0.0–5.0)
HEMATOCRIT: 44.2 % (ref 36.0–46.0)
HEMOGLOBIN: 14.5 g/dL (ref 12.0–15.0)
LYMPHS PCT: 33.9 % (ref 12.0–46.0)
Lymphs Abs: 4.1 10*3/uL — ABNORMAL HIGH (ref 0.7–4.0)
MCHC: 32.7 g/dL (ref 30.0–36.0)
MCV: 86.6 fl (ref 78.0–100.0)
Monocytes Absolute: 1.4 10*3/uL — ABNORMAL HIGH (ref 0.1–1.0)
Monocytes Relative: 11.3 % (ref 3.0–12.0)
Neutro Abs: 6.4 10*3/uL (ref 1.4–7.7)
Neutrophils Relative %: 52.8 % (ref 43.0–77.0)
Platelets: 182 10*3/uL (ref 150.0–400.0)
RBC: 5.11 Mil/uL (ref 3.87–5.11)
RDW: 13.8 % (ref 11.5–15.5)
WBC: 12.2 10*3/uL — AB (ref 4.0–10.5)

## 2015-04-21 LAB — URINALYSIS, ROUTINE W REFLEX MICROSCOPIC
Bilirubin Urine: NEGATIVE
Hgb urine dipstick: NEGATIVE
KETONES UR: NEGATIVE
Leukocytes, UA: NEGATIVE
Nitrite: NEGATIVE
PH: 6.5 (ref 5.0–8.0)
RBC / HPF: NONE SEEN (ref 0–?)
Total Protein, Urine: NEGATIVE
Urine Glucose: NEGATIVE
Urobilinogen, UA: 0.2 (ref 0.0–1.0)
WBC UA: NONE SEEN (ref 0–?)

## 2015-04-21 LAB — BASIC METABOLIC PANEL
BUN: 14 mg/dL (ref 6–23)
CHLORIDE: 103 meq/L (ref 96–112)
CO2: 33 meq/L — AB (ref 19–32)
Calcium: 9.9 mg/dL (ref 8.4–10.5)
Creatinine, Ser: 0.81 mg/dL (ref 0.40–1.20)
GFR: 71.66 mL/min (ref >60.00)
GLUCOSE: 93 mg/dL (ref 70–99)
POTASSIUM: 4.8 meq/L (ref 3.5–5.1)
Sodium: 141 mEq/L (ref 135–145)

## 2015-04-21 LAB — HEPATIC FUNCTION PANEL
ALBUMIN: 4.2 g/dL (ref 3.5–5.2)
ALT: 13 U/L (ref 0–35)
AST: 20 U/L (ref 0–37)
Alkaline Phosphatase: 87 U/L (ref 39–117)
BILIRUBIN TOTAL: 0.5 mg/dL (ref 0.2–1.2)
Bilirubin, Direct: 0.1 mg/dL (ref 0.0–0.3)
Total Protein: 7.6 g/dL (ref 6.0–8.3)

## 2015-04-21 NOTE — Patient Instructions (Signed)
It was good to see you today.  We have reviewed your prior records including labs and tests today  Test(s) ordered today. Your results will be released to [place] (or called to you) after review, usually within 72hours after test completion. If any changes need to be made, you will be notified at that same time.  Medications reviewed and updated, no changes recommended at this time.  we'll make referral for CT scan to further evaluate the causes of her pain  . Our office will contact you regarding appointment(s) once made.  other follow-up with GI or gynecology will be determined after review of results

## 2015-04-21 NOTE — Progress Notes (Signed)
Pre visit review using our clinic review tool, if applicable. No additional management support is needed unless otherwise documented below in the visit note. 

## 2015-04-21 NOTE — Progress Notes (Signed)
Subjective:    Patient ID: Tamara Robertson, female    DOB: 10-Nov-1929, 81 y.o.   MRN: [id number]  Abdominal Pain This is a chronic problem. The current episode started more than 1 month ago. The onset quality is gradual. The problem occurs daily (daily x 2 weeks). The pain is located in the LLQ. The pain is at a severity of 2/10. The pain is mild. The quality of the pain is dull and aching. The abdominal pain radiates to the suprapubic region and RLQ. Associated symptoms include diarrhea (intermittent liquid stool alternating with pellets, several BM/day) and headaches (sinus?). Pertinent negatives include no constipation, fever, hematochezia, melena, nausea, vomiting or weight loss. The pain is aggravated by certain positions, bowel movement, eating and movement. The pain is relieved by nothing. She has tried nothing ([employer]) for the symptoms. Prior diagnostic workup includes ultrasound. Her past medical history is significant for abdominal surgery, GERD and irritable bowel syndrome. ovarian cyst on left, following with gyn onc     Past Medical History  Diagnosis Date  . OSTEOARTHRITIS, KNEE, RIGHT   . URINARY INCONTINENCE   . Irritable bowel syndrome   . DIVERTICULITIS, HX OF   . OSTEOPENIA   . Immune thrombocytopenic purpura (HCC)     chronic - baseline 80-100K, on pred  . UNSPECIFIED PERIPHERAL VASCULAR DISEASE   . OVERACTIVE BLADDER   . DYSLIPIDEMIA   . Glaucoma   . GERD   . HOH (hard of hearing)   . CAD (coronary artery disease)     RCA 40% stenosis cath 01/2011  . Left ovarian cyst dx 01/2013 CT    working with gyn, ?malignant - elevated tumor marker OVA1  . Cholelithiasis     s/p lap chole 09/2014  . Elevated LFTs   . Hepatic steatosis   . Anxiety     Review of Systems  Constitutional: Positive for fatigue. Negative for fever, weight loss and unexpected weight change.  Respiratory: Negative for cough and shortness of breath.   Cardiovascular: Negative for chest pain and  leg swelling.  Gastrointestinal: Positive for abdominal pain (LLQ) and diarrhea (intermittent liquid stool alternating with pellets, several BM/day). Negative for nausea, vomiting, constipation, blood in stool, melena and hematochezia.  Neurological: Positive for headaches (sinus?).       Objective:    Physical Exam  Constitutional: She appears well-developed and well-nourished. No distress.  Cardiovascular: Normal rate, regular rhythm and normal heart sounds.   No murmur heard. Pulmonary/Chest: Effort normal and breath sounds normal. No respiratory distress.  Abdominal: Soft. Bowel sounds are normal. There is tenderness (LLQ). There is no rebound and no guarding.  Musculoskeletal: She exhibits no edema.  Psychiatric: She has a normal mood and affect. Her behavior is normal. Judgment and thought content normal.    BP 150/80 mmHg  Pulse 75  Temp(Src) 97.4 F (36.3 C) (Oral)  Ht 5\' 2"  (1.575 m)  Wt 141 lb (63.957 kg)  BMI 25.78 kg/m2  SpO2 98% Wt Readings from Last 3 Encounters:  04/21/15 141 lb (63.957 kg)  03/16/15 141 lb (63.957 kg)  03/04/15 141 lb 14.4 oz (64.365 kg)     Lab Results  Component Value Date   WBC 14.1* 03/16/2015   HGB 13.7 03/16/2015   HCT 41.5 03/16/2015   PLT 168.0 03/16/2015   GLUCOSE 89 12/16/2014   CHOL 215* 06/22/2014   TRIG 161* 06/22/2014   HDL 51 06/22/2014   LDLDIRECT 155.9 03/19/2014   LDLCALC 132* 06/22/2014  ALT 16 12/16/2014   AST 23 12/16/2014   NA 141 12/16/2014   K 4.2 12/16/2014   CL 100* 09/20/2014   CREATININE 0.9 12/16/2014   BUN 12.0 12/16/2014   CO2 28 12/16/2014   TSH 2.95 06/03/2012   INR 1.1* 02/16/2011   HGBA1C 5.8 03/19/2014    US Transvaginal Non-ob  04/06/2015  CLINICAL DATA:  81 year old female with a history of hysterectomy in 1963 presents for follow-up of left ovarian cysts. New onset lower abdominal pain. EXAM: TRANSABDOMINAL AND TRANSVAGINAL ULTRASOUND OF PELVIS TECHNIQUE: Both transabdominal and  transvaginal ultrasound examinations of the pelvis were performed. Transabdominal technique was performed for global imaging of the pelvis including vaginal cuff, ovaries, adnexal regions, and pelvic cul-de-sac. It was necessary to proceed with endovaginal exam following the transabdominal exam to visualize the vaginal cuff and adnexa. COMPARISON:  05/26/2014 pelvic sonogram. FINDINGS: The patient is status post hysterectomy. No cystic or solid mass is seen at the vaginal cuff. No abnormal free fluid is seen in the pelvis. The right ovary is not visualized. There are no right adnexal masses. The left ovary measures 4.0 x 2.7 x 4.4 cm. There is a minimally complex 3.3 x 2.4 x 3.1 cm cystic structure in the left ovary, which demonstrates a tiny 3 mm echogenic mural focus with associated reverberation artifact, which measured 2.8 x 2.1 x 3.0 cm on 05/26/2014 and 2.7 x 2.2 x 3.1 cm on 05/25/2013 using similar measurement technique, and is minimally increased in size. There are two additional subjacent simple 1.0 x 0.8 x 0.8 cm and 1.0 x 0.8 x 1.0 cm cystic structures in the left ovary, which were likely both present on and not appreciably changed compared to the 05/26/2014 pelvic sonogram, but better resolved on today's scan. IMPRESSION: 1. Slight interval growth of minimally complex 3.3 cm left ovarian cystic structure compared to the 05/25/2013 scan, which probably represents a benign serous cystadenoma given the slow growth, absence of definitive aggressive features and absence of free fluid in the pelvis. Recommend correlation with serum tumor markers. Continued sonographic surveillance is warranted in 3-6 months. 2. Nonvisualization of the right ovary.  No right adnexal mass. 3. Status post hysterectomy. No abnormal findings at the vaginal cuff. Electronically Signed   By: Delbert Phenix M.D.   On: 04/06/2015 12:25   US Pelvis Complete  04/06/2015  CLINICAL DATA:  81 year old female with a history of hysterectomy  in 1963 presents for follow-up of left ovarian cysts. New onset lower abdominal pain. EXAM: TRANSABDOMINAL AND TRANSVAGINAL ULTRASOUND OF PELVIS TECHNIQUE: Both transabdominal and transvaginal ultrasound examinations of the pelvis were performed. Transabdominal technique was performed for global imaging of the pelvis including vaginal cuff, ovaries, adnexal regions, and pelvic cul-de-sac. It was necessary to proceed with endovaginal exam following the transabdominal exam to visualize the vaginal cuff and adnexa. COMPARISON:  05/26/2014 pelvic sonogram. FINDINGS: The patient is status post hysterectomy. No cystic or solid mass is seen at the vaginal cuff. No abnormal free fluid is seen in the pelvis. The right ovary is not visualized. There are no right adnexal masses. The left ovary measures 4.0 x 2.7 x 4.4 cm. There is a minimally complex 3.3 x 2.4 x 3.1 cm cystic structure in the left ovary, which demonstrates a tiny 3 mm echogenic mural focus with associated reverberation artifact, which measured 2.8 x 2.1 x 3.0 cm on 05/26/2014 and 2.7 x 2.2 x 3.1 cm on 05/25/2013 using similar measurement technique, and is minimally increased in size.  There are two additional subjacent simple 1.0 x 0.8 x 0.8 cm and 1.0 x 0.8 x 1.0 cm cystic structures in the left ovary, which were likely both present on and not appreciably changed compared to the 05/26/2014 pelvic sonogram, but better resolved on today's scan. IMPRESSION: 1. Slight interval growth of minimally complex 3.3 cm left ovarian cystic structure compared to the 05/25/2013 scan, which probably represents a benign serous cystadenoma given the slow growth, absence of definitive aggressive features and absence of free fluid in the pelvis. Recommend correlation with serum tumor markers. Continued sonographic surveillance is warranted in 3-6 months. 2. Nonvisualization of the right ovary.  No right adnexal mass. 3. Status post hysterectomy. No abnormal findings at the  vaginal cuff. Electronically Signed   By: Delbert Phenix M.D.   On: 04/06/2015 12:25       Assessment & Plan:   LLQ pain - ongoing intermittently for over 4 months, daily for the past 2 weeks. Describes as achy dull discomfort constantly present in left side radiating across suprapubic area and into right at times. Differential includes diverticulitis, IBS flare, symptomatic ovarian cyst  As patient is afebrile, will hold empiric antibiotics pending further diagnostic testing  Check CBC, be met and LFTs as well as UA Refer for CT scan abdomen and pelvis with contrast if the creatinine within normal range  Other follow-up with GI and gynecologist oncologist depending on results and symptoms  Problem List Items Addressed This Visit    IRRITABLE BOWEL SYNDROME   Relevant Orders   CT Abdomen Pelvis W Contrast   CBC with Differential/Platelet   Basic metabolic panel   Hepatic function panel   Urinalysis, Routine w reflex microscopic (not at [facility])   Left ovarian cyst   Relevant Orders   CT Abdomen Pelvis W Contrast   CBC with Differential/Platelet   Basic metabolic panel   Hepatic function panel   Urinalysis, Routine w reflex microscopic (not at [facility])    Other Visit Diagnoses    LLQ abdominal pain    -  Primary    Relevant Orders    CT Abdomen Pelvis W Contrast    CBC with Differential/Platelet    Basic metabolic panel    Hepatic function panel    Urinalysis, Routine w reflex microscopic (not at [facility])        Rene Paci, MD

## 2015-04-21 NOTE — Telephone Encounter (Signed)
Pt states she went ahead and saw her PCP today and had labs drawn, will have CT of A/P tomorrow.

## 2015-04-21 NOTE — Telephone Encounter (Signed)
Left message for pt to call back  °

## 2015-04-22 ENCOUNTER — Ambulatory Visit
Admission: RE | Admit: 2015-04-22 | Discharge: 2015-04-22 | Disposition: A | Discharge status: Home / Self Care | Payer: Medicare Other | Source: Ambulatory Visit | Attending: Internal Medicine | Admitting: Internal Medicine

## 2015-04-22 DIAGNOSIS — R1032 Left lower quadrant pain: Secondary | ICD-10-CM

## 2015-04-22 DIAGNOSIS — N83202 Unspecified ovarian cyst, left side: Secondary | ICD-10-CM

## 2015-04-22 DIAGNOSIS — K582 Mixed irritable bowel syndrome: Secondary | ICD-10-CM

## 2015-04-22 MED ORDER — IOPAMIDOL (ISOVUE-300) INJECTION 61%
100.0000 mL | Freq: Once | INTRAVENOUS | Status: AC | PRN
  Administered 2015-04-22: 100 mL via INTRAVENOUS

## 2015-04-27 ENCOUNTER — Ambulatory Visit: Payer: Medicare Other | Admitting: Gynecologic Oncology

## 2015-05-03 ENCOUNTER — Other Ambulatory Visit: Payer: Self-pay

## 2015-05-03 DIAGNOSIS — Z1231 Encounter for screening mammogram for malignant neoplasm of breast: Secondary | ICD-10-CM

## 2015-05-20 DIAGNOSIS — L304 Erythema intertrigo: Secondary | ICD-10-CM | POA: Diagnosis not present

## 2015-05-20 DIAGNOSIS — L821 Other seborrheic keratosis: Secondary | ICD-10-CM | POA: Diagnosis not present

## 2015-05-20 DIAGNOSIS — L820 Inflamed seborrheic keratosis: Secondary | ICD-10-CM | POA: Diagnosis not present

## 2015-05-20 DIAGNOSIS — Z85828 Personal history of other malignant neoplasm of skin: Secondary | ICD-10-CM | POA: Diagnosis not present

## 2015-05-22 ENCOUNTER — Other Ambulatory Visit: Payer: Self-pay | Admitting: Internal Medicine

## 2015-05-23 NOTE — Telephone Encounter (Signed)
Pt has an appt to est with you.   Rf rq for prednisone. I believe this is a long term medication but would like approval to fill if you feel this is appropriate.

## 2015-05-24 ENCOUNTER — Telehealth: Payer: Self-pay | Admitting: Oncology

## 2015-05-24 NOTE — Telephone Encounter (Signed)
Left message in regards to appointment change 2/17 to 2/24. Appointment sent via mail.

## 2015-06-14 ENCOUNTER — Ambulatory Visit
Admission: RE | Admit: 2015-06-14 | Discharge: 2015-06-14 | Disposition: A | Discharge status: Home / Self Care | Payer: Medicare Other | Source: Ambulatory Visit

## 2015-06-14 DIAGNOSIS — Z1231 Encounter for screening mammogram for malignant neoplasm of breast: Secondary | ICD-10-CM | POA: Diagnosis not present

## 2015-06-24 ENCOUNTER — Ambulatory Visit: Payer: Self-pay | Admitting: Oncology

## 2015-06-24 ENCOUNTER — Other Ambulatory Visit: Payer: Self-pay

## 2015-07-01 ENCOUNTER — Telehealth: Payer: Self-pay | Admitting: Oncology

## 2015-07-01 ENCOUNTER — Other Ambulatory Visit (HOSPITAL_BASED_OUTPATIENT_CLINIC_OR_DEPARTMENT_OTHER): Payer: Medicare Other

## 2015-07-01 ENCOUNTER — Ambulatory Visit (HOSPITAL_BASED_OUTPATIENT_CLINIC_OR_DEPARTMENT_OTHER): Payer: Medicare Other | Admitting: Oncology

## 2015-07-01 VITALS — BP 150/52 | HR 75 | Temp 98.0°F | Resp 18 | Ht 62.0 in | Wt 145.3 lb

## 2015-07-01 DIAGNOSIS — D693 Immune thrombocytopenic purpura: Secondary | ICD-10-CM

## 2015-07-01 LAB — COMPREHENSIVE METABOLIC PANEL
ALBUMIN: 3.8 g/dL (ref 3.5–5.0)
ALK PHOS: 109 U/L (ref 40–150)
ALT: 12 U/L (ref 0–55)
AST: 19 U/L (ref 5–34)
Anion Gap: 8 mEq/L (ref 3–11)
BUN: 15.5 mg/dL (ref 7.0–26.0)
CALCIUM: 9.2 mg/dL (ref 8.4–10.4)
CO2: 28 mEq/L (ref 22–29)
CREATININE: 0.8 mg/dL (ref 0.6–1.1)
Chloride: 105 mEq/L (ref 98–109)
EGFR: 66 mL/min/{1.73_m2} — ABNORMAL LOW (ref >90)
GLUCOSE: 111 mg/dL (ref 70–140)
POTASSIUM: 4.2 meq/L (ref 3.5–5.1)
SODIUM: 141 meq/L (ref 136–145)
Total Bilirubin: <0.3 mg/dL (ref 0.20–1.20)
Total Protein: 7.3 g/dL (ref 6.4–8.3)

## 2015-07-01 LAB — CBC WITH DIFFERENTIAL/PLATELET
BASO%: 1.1 % (ref 0.0–2.0)
BASOS ABS: 0.1 10*3/uL (ref 0.0–0.1)
EOS%: 1.7 % (ref 0.0–7.0)
Eosinophils Absolute: 0.2 10*3/uL (ref 0.0–0.5)
HEMATOCRIT: 42.1 % (ref 34.8–46.6)
HEMOGLOBIN: 13.8 g/dL (ref 11.6–15.9)
LYMPH#: 3.7 10*3/uL — AB (ref 0.9–3.3)
LYMPH%: 40.4 % (ref 14.0–49.7)
MCH: 28.3 pg (ref 25.1–34.0)
MCHC: 32.9 g/dL (ref 31.5–36.0)
MCV: 86 fL (ref 79.5–101.0)
MONO#: 0.9 10*3/uL (ref 0.1–0.9)
MONO%: 9.9 % (ref 0.0–14.0)
NEUT%: 46.9 % (ref 38.4–76.8)
NEUTROS ABS: 4.3 10*3/uL (ref 1.5–6.5)
Platelets: 147 10*3/uL (ref 145–400)
RBC: 4.89 10*6/uL (ref 3.70–5.45)
RDW: 13.3 % (ref 11.2–14.5)
WBC: 9.1 10*3/uL (ref 3.9–10.3)

## 2015-07-01 NOTE — Progress Notes (Signed)
Hematology and Oncology Follow Up Visit  Tamara Robertson [id number] 12-01-1929 82 y.o. 07/01/2015 2:56 PM   Principle Diagnosis: 82 year old with chronic, relapsing ITP diagnosed in the 56s.   Prior Therapy: Patient S/P splenectomy and subsequently treated with high doses of steroids. The patient have had a complete response to steroids back in the 60s and all the way have had a few relapses. Every time she has a relapse she gets restarted on high-dose of steroids and she achieved a complete response. The most recent of relapses before her move to [facility] she was hospitalized for a platelet count of 8000 around the year 2000.  Current therapy: Prednisone to 2.5 mg every other day. She has been in remission since 2000.   Interim History: Tamara Robertson returns today for a follow up visit by himself. Since the last visit, she reports her health has been reasonably well but she is still grieving the loss of her husband. She is managing an coping reasonably well overall. She has not reported any recent illnesses or hospitalization.     She is completely asymptomatic at this point. She she is not reporting any bleeding, easy bruisability, or any skin changes. She has not reported any hematochezia or melena she has not reported any epistaxis or hemoptysis. He denied any issues related to steroids including hyperglycemia, fluid retention or weight gain.  She has not reported any headaches or blurry vision or double vision. Has not reported any chest pain cough or hemoptysis. Has not reported any nausea or vomiting or abdominal pain. Has not reported any frequency urgency or hematuria. She does not report any skeletal complaints. Rest or view of systems unremarkable.   Medications: I have reviewed the patient's current medications. Unchanged by my review. Current Outpatient Prescriptions  Medication Sig Dispense Refill  . ALPRAZolam (XANAX) 0.25 MG tablet Take 1 tablet (0.25 mg total) by mouth 2 (two)  times daily as needed for anxiety. 30 tablet 1  . Biotin 2500 MCG CAPS Take by mouth daily.    . Calcium Citrate (CITRACAL PO) One capful three times a week    . Calcium-Magnesium-Vitamin D (CITRACAL CALCIUM+D) 600-40-500 MG-MG-UNIT TB24 Take 1 tablet by mouth daily.     . clindamycin (CLEOCIN-T) 1 % external solution Apply topically 2 (two) times daily. 30 mL 0  . clotrimazole-betamethasone (LOTRISONE) cream Apply 1 application topically 2 (two) times daily as needed. 15 g 1  . dorzolamide-timolol (COSOPT) 22.3-6.8 MG/ML ophthalmic solution Place 1 drop into both eyes 2 (two) times daily.     . Flaxseed MISC by Does not apply route. Take 1 tsp daily    . imiquimod (ALDARA) 5 % cream Apply 1 Package topically 3 (three) times a week. Mondays, Wednesdays and Fridays in the morning  0  . Magnesium 250 MG TABS Take 250 mg by mouth daily.    . methocarbamol (ROBAXIN) 500 MG tablet Take 1 tablet (500 mg total) by mouth every 8 (eight) hours as needed for muscle spasms. 20 tablet 1  . Multiple Vitamin (MULTIVITAMIN) tablet Take 1 tablet by mouth daily.      . pantoprazole (PROTONIX) 20 MG tablet Take 1 tablet (20 mg total) by mouth daily. 90 tablet 3  . polyethylene glycol (MIRALAX / GLYCOLAX) packet Take 17 g by mouth every 3 (three) days.    . predniSONE (DELTASONE) 2.5 MG tablet TAKE ONE TABLET BY MOUTH ONCE DAILY 30 tablet 0  . Probiotic Product (PROBIOTIC FORMULA PO) Take 1 tablet  by mouth daily.      . traMADol (ULTRAM) 50 MG tablet Take 1 tablet (50 mg total) by mouth every 6 (six) hours as needed for moderate pain. 60 tablet 1   No current facility-administered medications for this visit.    Allergies:  Allergies  Allergen Reactions  . Aspirin Other (See Comments)    ITP  . Contrast Media [Iodinated Diagnostic Agents]     hives  . Statins   . Sulfa Antibiotics Other (See Comments)    dizziness    Past Medical History, Surgical history, Social history, and Family History were  reviewed and updated.  Physical Exam: Blood pressure 150/52, pulse 75, temperature 98 F (36.7 C), temperature source Oral, resp. rate 18, height 5\' 2"  (1.575 m), weight 145 lb 4.8 oz (65.908 kg), SpO2 100 %. ECOG: 1 General appearance: alert awake not in any distress. Her affect and mood is appropriate. Head: Normocephalic, without obvious abnormality Neck: no adenopathy.  Lymph nodes: Cervical, supraclavicular, and axillary nodes normal. Heart:regular rate and rhythm, S1, S2 normal, no murmur, click, rub or gallop Lung:chest clear, no wheezing, rales, normal symmetric air entry Abdomin: soft, non-tender, without masses or organomegaly no shifting dullness or ascites. EXT:no erythema, induration, or nodules Neurologically intact.  Skin: No ecchymosis or petechiae.    Lab Results: Lab Results  Component Value Date   WBC 9.1 07/01/2015   HGB 13.8 07/01/2015   HCT 42.1 07/01/2015   MCV 86.0 07/01/2015   PLT 147 07/01/2015     Chemistry      Component Value Date/Time   NA 141 04/21/2015 1114   NA 141 12/16/2014 1235   K 4.8 04/21/2015 1114   K 4.2 12/16/2014 1235   CL 103 04/21/2015 1114   CL 105 04/17/2012 1452   CO2 33* 04/21/2015 1114   CO2 28 12/16/2014 1235   BUN 14 04/21/2015 1114   BUN 12.0 12/16/2014 1235   CREATININE 0.81 04/21/2015 1114   CREATININE 0.9 12/16/2014 1235      Component Value Date/Time   CALCIUM 9.9 04/21/2015 1114   CALCIUM 8.9 12/16/2014 1235   ALKPHOS 87 04/21/2015 1114   ALKPHOS 84 12/16/2014 1235   AST 20 04/21/2015 1114   AST 23 12/16/2014 1235   ALT 13 04/21/2015 1114   ALT 16 12/16/2014 1235   BILITOT 0.5 04/21/2015 1114   BILITOT 0.41 12/16/2014 1235      Impression and Plan:   82 year old woman with the following issues:  1. Chronic relapsing immune thrombocytopenia: He is status post multiple therapies in the past but have been in remission since your 2000. Is currently on maintenance steroids at 2.5 mg every other day. Her  laboratory data were reviewed from today and showed no evidence of relapse. The plan is to continue on the same maintenance dose of steroids and follow-up in 6 months.  2. Age-appropriate cancer screening: She is up-to-date at this time.  3. Follow-up: Will be in 6 months.  Eli Hose, MD 2/24/20172:56 PM

## 2015-07-01 NOTE — Telephone Encounter (Signed)
per pof to sch pt appt-gave pt copy of avs °

## 2015-07-04 ENCOUNTER — Encounter: Payer: Self-pay | Admitting: *Deleted

## 2015-07-05 DIAGNOSIS — M549 Dorsalgia, unspecified: Secondary | ICD-10-CM | POA: Diagnosis not present

## 2015-07-07 ENCOUNTER — Ambulatory Visit (HOSPITAL_COMMUNITY): Payer: Medicare Other

## 2015-07-07 ENCOUNTER — Ambulatory Visit (HOSPITAL_COMMUNITY)
Admission: RE | Admit: 2015-07-07 | Discharge: 2015-07-07 | Disposition: A | Discharge status: Home / Self Care | Payer: Medicare Other | Source: Ambulatory Visit | Attending: Gynecologic Oncology | Admitting: Gynecologic Oncology

## 2015-07-07 DIAGNOSIS — N83292 Other ovarian cyst, left side: Secondary | ICD-10-CM | POA: Diagnosis not present

## 2015-07-07 DIAGNOSIS — N83299 Other ovarian cyst, unspecified side: Secondary | ICD-10-CM | POA: Diagnosis present

## 2015-07-08 ENCOUNTER — Telehealth: Payer: Self-pay | Admitting: Gynecologic Oncology

## 2015-07-08 NOTE — Telephone Encounter (Signed)
Informed patient of [place] results.  "Why am I still hurting?"  Patient stating she has had colon surgery in the past and has had issues with her colon.  Advised her concerns would be discussed with Dr. Alycia Rossetti and an appt for March 8 would be made if necessary.  Advised that our office would reach out to her with Dr. Elenora Gamma recommendations.  No concerns voiced.

## 2015-07-12 ENCOUNTER — Encounter: Payer: Self-pay | Admitting: Internal Medicine

## 2015-07-12 ENCOUNTER — Ambulatory Visit (INDEPENDENT_AMBULATORY_CARE_PROVIDER_SITE_OTHER): Payer: Medicare Other | Admitting: Internal Medicine

## 2015-07-12 VITALS — BP 140/76 | HR 82 | Ht 62.0 in | Wt 147.0 lb

## 2015-07-12 DIAGNOSIS — K589 Irritable bowel syndrome without diarrhea: Secondary | ICD-10-CM | POA: Diagnosis not present

## 2015-07-12 DIAGNOSIS — N83209 Unspecified ovarian cyst, unspecified side: Secondary | ICD-10-CM | POA: Diagnosis not present

## 2015-07-12 DIAGNOSIS — Z9049 Acquired absence of other specified parts of digestive tract: Secondary | ICD-10-CM | POA: Diagnosis not present

## 2015-07-12 NOTE — Patient Instructions (Signed)
Continue taking your Miralax.  Please follow up as needed

## 2015-07-12 NOTE — Progress Notes (Signed)
Subjective:    Patient ID: Tamara Robertson, female    DOB: 09-05-1929, 82 y.o.   MRN: [id number]  HPI Tamara Robertson is an 82 year old female with past medical history of IBS , diverticulosis/ diverticulitis status post sigmoidectomy in April 24, 2006, and gallstones status post cholecystectomy in May 2016 he is here for follow-up. I sent her to see Dr. Dwain Sarna for symptomatic cholelithiasis and he performed cholecystectomy in May 2016. She recovered very well from this and has done well. She did have a noticeable change in bowel habit after surgery starting sometime in summer 2016. For a while there and at its worst in October she was having 2-3 bowel movements in the morning the last of which was diarrhea. For a while this was troublesome and often kept her at home. She was also dealing with her husband who is diagnosed with metastatic lung cancer in 08/18/16and  Died in April 25, 2015. They had been married 64 years.  Most recently her BMs have normalized and she describes them now as "good".  She is using MiraLax 17 g qHS to avoid constipation.  No blood in her stools, rectal bleeding or melena.  Occasional "IBS" pain Which is crampy in nature and can be left or right sided in the abdomen. Recently even this has been better. She had a CT scan ordered by primary care in December of the abdomen and pelvis. This showed unremarkable bowel the question of constipation due to colonic stool burden. She had an ovarian cyst which was enlarged. She has had recent ultrasound and has OB/GYN appointment tomorrow with Dr. Duard Brady.  She may be facing ovarian surgery. Good appetite. No nausea or vomiting. Denies heartburn or trouble swallowing.  Review of Systems  as per history of present illness, otherwise negative  Current Medications, Allergies, Past Medical History, Past Surgical History, Family History and Social History were reviewed in [employer] record.     Objective:   Physical Exam BP  140/76 mmHg  Pulse 82  Ht 5\' 2"  (1.575 m)  Wt 147 lb (66.679 kg)  BMI 26.88 kg/m2 Constitutional: Well-developed and well-nourished. No distress. HEENT: Normocephalic and atraumatic. Conjunctivae are normal.  No scleral icterus. Neck: Neck supple. Trachea midline. Cardiovascular: Normal rate, regular rhythm and intact distal pulses. No M/R/G Pulmonary/chest: Effort normal and breath sounds normal. No wheezing, rales or rhonchi. Abdominal: Soft, nontender, nondistended. Bowel sounds active throughout. There are no masses palpable. No hepatosplenomegaly. Extremities: no clubbing, cyanosis, or edema Neurological: Alert and oriented to person place and time. Skin: Skin is warm and dry.  Psychiatric: Normal mood and affect. Behavior is normal.  CMP     Component Value Date/Time   NA 141 07/01/2015 1424   NA 141 04/21/2015 1114   K 4.2 07/01/2015 1424   K 4.8 04/21/2015 1114   CL 103 04/21/2015 1114   CL 105 04/17/2012 1452   CO2 28 07/01/2015 1424   CO2 33* 04/21/2015 1114   GLUCOSE 111 07/01/2015 1424   GLUCOSE 93 04/21/2015 1114   GLUCOSE 122* 04/17/2012 1452   BUN 15.5 07/01/2015 1424   BUN 14 04/21/2015 1114   CREATININE 0.8 07/01/2015 1424   CREATININE 0.81 04/21/2015 1114   CALCIUM 9.2 07/01/2015 1424   CALCIUM 9.9 04/21/2015 1114   PROT 7.3 07/01/2015 1424   PROT 7.6 04/21/2015 1114   ALBUMIN 3.8 07/01/2015 1424   ALBUMIN 4.2 04/21/2015 1114   AST 19 07/01/2015 1424   AST 20 04/21/2015 1114  ALT 12 07/01/2015 1424   ALT 13 04/21/2015 1114   ALKPHOS 109 07/01/2015 1424   ALKPHOS 87 04/21/2015 1114   BILITOT <0.30 07/01/2015 1424   BILITOT 0.5 04/21/2015 1114   GFRNONAA >60 09/20/2014 0513   GFRAA >60 09/20/2014 0513    CBC    Component Value Date/Time   WBC 9.1 07/01/2015 1424   WBC 12.2* 04/21/2015 1114   RBC 4.89 07/01/2015 1424   RBC 5.11 04/21/2015 1114   HGB 13.8 07/01/2015 1424   HGB 14.5 04/21/2015 1114   HCT 42.1 07/01/2015 1424   HCT 44.2  04/21/2015 1114   PLT 147 07/01/2015 1424   PLT 182.0 04/21/2015 1114   MCV 86.0 07/01/2015 1424   MCV 86.6 04/21/2015 1114   MCH 28.3 07/01/2015 1424   MCH 28.2 09/20/2014 0513   MCHC 32.9 07/01/2015 1424   MCHC 32.7 04/21/2015 1114   RDW 13.3 07/01/2015 1424   RDW 13.8 04/21/2015 1114   LYMPHSABS 3.7* 07/01/2015 1424   LYMPHSABS 4.1* 04/21/2015 1114   MONOABS 0.9 07/01/2015 1424   MONOABS 1.4* 04/21/2015 1114   EOSABS 0.2 07/01/2015 1424   EOSABS 0.2 04/21/2015 1114   BASOSABS 0.1 07/01/2015 1424   BASOSABS 0.0 04/21/2015 1114      CLINICAL DATA:  Left lower quadrant pain x4 months, IBS, spastic colon. Prior cholecystectomy, appendectomy, splenectomy, sigmoid colectomy, and hysterectomy.   EXAM: CT ABDOMEN AND PELVIS WITH CONTRAST   TECHNIQUE: Multidetector CT imaging of the abdomen and pelvis was performed using the standard protocol following bolus administration of intravenous contrast.   CONTRAST:  ISOVUE-300 IOPAMIDOL (ISOVUE-300) INJECTION 61%   COMPARISON:  Pelvic ultrasound dated 04/06/2015. MRI abdomen dated 07/01/2014. CT abdomen pelvis dated 02/02/2013.   FINDINGS: Patient received a 13 hour steroid prep prior to contrast administration. However, patient still has pruritus with at least one hive on her abdomen after the study. She was evaluated and released without acute distress or respiratory symptoms.   Lower chest:  Lung bases are clear.   Hepatobiliary: Subtle subcentimeter enhancing lesion inferiorly in the right hepatic lobe (coronal image 54), corresponding to a known hemangioma on MRI. Liver is otherwise within normal limits.   Status post cholecystectomy. No intrahepatic ductal dilatation. Common duct measures 9 mm (coronal image 53), likely postsurgical.   Pancreas: Within normal limits.   Spleen: Surgically absent.   Adrenals/Urinary Tract: Adrenal glands are within normal limits.   2.8 cm cyst in the anterior right upper  kidney with a single thin septation (series 3/ image 25), benign (Bosniak II). Additional subcentimeter right renal cyst. Left kidney is within normal limits. No hydronephrosis.   Bladder is within normal limits.   Stomach/Bowel: Stomach is within normal limits.   No evidence of bowel obstruction.   Prior appendectomy.   Status post left hemicolectomy with suture line in the pelvis (series 3/ image 69).   Moderate colonic stool burden.   Vascular/Lymphatic: Atherosclerotic calcifications of the abdominal aorta and branch vessels. No evidence of abdominal aortic aneurysm.   No suspicious abdominopelvic lymphadenopathy.   Reproductive: Status post hysterectomy.   2.7 x 3.3 cm left ovarian cystic lesion (series 3/ image 61), previously 2.2 x 2.8 cm in 2014.   No right adnexal mass.   Other: No abdominopelvic ascites.   Musculoskeletal: Degenerative changes of the visualized thoracolumbar spine.   IMPRESSION: Mild contrast reaction (hives) despite a 13 hour steroid premedication.   Prior left hemicolectomy and appendectomy. No evidence of bowel obstruction. Moderate  colonic stool burden, raising the possibility of constipation.   Status post hysterectomy. 2.7 x 3.3 cm left ovarian cystic lesion, mildly increased from 2014, favored to reflect a benign ovarian neoplasm such as a serous cystadenoma. Given size and slow growth in a postmenopausal female, in the absence of overt malignant features, follow-up pelvic ultrasound is suggested in 1 year.   Status post cholecystectomy and splenectomy.   Additional ancillary findings as above.     Electronically Signed   By: Charline Bills M.D.   On: 04/22/2015 13:16             Assessment & Plan:   82 year old female with past medical history of IBS , diverticulosis/ diverticulitis status post sigmoidectomy in 2007, and gallstones status post cholecystectomy in May 2016 he is here for follow-up.  1. IBS/fairly  recent cholecystectomy -- most likely her loose stools were secondary to bile salt colopathy after cholecystectomy. This has improved significantly. I also think her IBS likely flared and around her husband's illness with the associated stress and then grief. This also has been improved. She continues on MiraLAX given history of constipation and I let her know that should she develop loose stools my first recommendation would be to stop MiraLAX or decreased dosing frequency. She voiced understanding. No evidence of alarm symptom. CT scan abdomen and pelvis reviewed today and no bowel pathology noted.  2. Ovarian cyst -- has appropriate GYN appointment tomorrow  She can follow-up as needed

## 2015-07-13 ENCOUNTER — Encounter: Payer: Self-pay | Admitting: Gynecologic Oncology

## 2015-07-13 ENCOUNTER — Ambulatory Visit: Payer: Medicare Other | Attending: Gynecologic Oncology | Admitting: Gynecologic Oncology

## 2015-07-13 VITALS — BP 143/53 | HR 82 | Temp 97.8°F | Resp 18 | Ht 62.0 in | Wt 146.2 lb

## 2015-07-13 DIAGNOSIS — R1909 Other intra-abdominal and pelvic swelling, mass and lump: Secondary | ICD-10-CM | POA: Insufficient documentation

## 2015-07-13 DIAGNOSIS — N83209 Unspecified ovarian cyst, unspecified side: Secondary | ICD-10-CM

## 2015-07-13 DIAGNOSIS — E785 Hyperlipidemia, unspecified: Secondary | ICD-10-CM | POA: Diagnosis not present

## 2015-07-13 DIAGNOSIS — Z9081 Acquired absence of spleen: Secondary | ICD-10-CM | POA: Diagnosis not present

## 2015-07-13 DIAGNOSIS — K760 Fatty (change of) liver, not elsewhere classified: Secondary | ICD-10-CM | POA: Insufficient documentation

## 2015-07-13 DIAGNOSIS — F419 Anxiety disorder, unspecified: Secondary | ICD-10-CM | POA: Diagnosis not present

## 2015-07-13 DIAGNOSIS — D693 Immune thrombocytopenic purpura: Secondary | ICD-10-CM | POA: Diagnosis not present

## 2015-07-13 DIAGNOSIS — H409 Unspecified glaucoma: Secondary | ICD-10-CM | POA: Diagnosis not present

## 2015-07-13 DIAGNOSIS — Z90710 Acquired absence of both cervix and uterus: Secondary | ICD-10-CM | POA: Insufficient documentation

## 2015-07-13 DIAGNOSIS — K219 Gastro-esophageal reflux disease without esophagitis: Secondary | ICD-10-CM | POA: Diagnosis not present

## 2015-07-13 DIAGNOSIS — I2510 Atherosclerotic heart disease of native coronary artery without angina pectoris: Secondary | ICD-10-CM | POA: Insufficient documentation

## 2015-07-13 DIAGNOSIS — I739 Peripheral vascular disease, unspecified: Secondary | ICD-10-CM | POA: Insufficient documentation

## 2015-07-13 DIAGNOSIS — N83202 Unspecified ovarian cyst, left side: Secondary | ICD-10-CM | POA: Diagnosis not present

## 2015-07-13 NOTE — Progress Notes (Signed)
Consult Note: Gyn-Onc  Tamara Robertson 82 y.o. female  CC:  Chief Complaint  Patient presents with  . Ovarian Cyst    MD follow up    HPI: Patient is an 82 year old gravida 3 para 2 aborta 1 who noticed a change in her bowels and that she has a narrow bowel movements about every 3 weeks and then her bowels have returned to normal. She was also experiencing some right lower quadrant pain. The patient states she has a history of spastic colon and IBS. However she cannot tolerate bowel prep for colonoscopy she underwent a CT scan of the abdomen and pelvis which revealed:  CT ABDOMEN AND PELVIS WITH CONTRAST   Lung bases are clear.  8 mm hypervascular lesion in the inferior right hepatic lobe (series 2/ image 35), likely reflecting a flash filling hemangioma. Prior splenectomy. Pancreas and adrenal glands are within normal limits. Gallbladder is unremarkable. No intrahepatic or extrahepatic ductal dilatation. Right renal cysts, including a 2.7 cm anterior upper pole cyst (series 5/ image 16) and a 1.5 cm posterior lower pole cyst (series 5/ image 7). Left kidney is within normal limits. No hydronephrosis. No evidence of bowel obstruction. Appendix is not discretely visualized and may be surgically absent. Prior sigmoid resection with anastomosis in the left pelvis (series 2/image 68). Atherosclerotic calcifications of the abdominal aorta and branch vessels.  No abdominopelvic ascites.  No suspicious abdominopelvic lymphadenopathy.  Status post hysterectomy. 2.8 cm left ovarian cyst (series 2/ image 61). No right adnexal mass. Mild prominence of the mid right ureter (series 2/image 53), although smoothly tapering distally, favored to reflect physiologic laxity.  Bladder is within normal limits. Degenerative changes of the visualized thoracolumbar spine.  IMPRESSION:  No evidence of bowel obstruction.  Prior splenectomy, sigmoidectomy, and hysterectomy. Suspected prior appendectomy. No CT findings to  account for the patient's right lower quadrant abdominal pain. 8 mm hypervascular lesion in the inferior right hepatic lobe, likely reflecting a flash filling hemangioma. 2.8 cm left ovarian cyst. Given postmenopausal status, follow-up pelvic ultrasound is suggested in 1 year.  The CT findings prompted an ultrasound and Dr. Assunta Curtis office. This was performed on October 14. 2 small cysts were noted within the region of the left adnexa. There was a 2.9 x 2.7 x 2.0 cm cyst with soft tissue nodule measuring 1.3 x 0.6 x 1.1 cm within the cyst. There's a separate small cysts measuring 1.8 x 1.8 x 1.5 cm simple in appearance. There is no increased vascularity noted. A CA 125 was performed that was normal at 23. However, an OVA-1 test was performed that was slightly elevated at 4.7 with the upper limit of normal for a menopausal woman being 4.4.  Ultrasound 05/25/13: Left ovary  Measurements: 3.5 x 2.4 x 3.3 cm. Two cyst within the left ovary measuring 2.2 x 1.4 x 2.0 cm and 2.3 x 2.3 x 2.5 cm. These either represent 2 adjacent simple cyst with the measurements as above or 1  larger cyst measuring up to 3.5 cm with a thin internal septation. Other findings No free fluid.  IMPRESSION:  Either two adjacent simple cysts or one larger cyst with a thin internal septation as described above. This is almost certainly benign, but follow up ultrasound is recommended in 1 year according to the Society of Radiologists in [place] Statement (D Clovis Riley et al. Management of Asymptomatic Ovarian and Other Adnexal Cysts Imaged at [place]: Society of Radiologists in [place] Statement 2010. Radiology 256 (  Sept 2010): 943-954.).  A CA 125 was also drawn that was normal at 13.8. This is down from 21 on 02/17/2013. She comes in today for followup. She is very pleased with the results of the ultrasound as well as the blood work. She's been completely asymptomatic and has no complaints  related to this.  Interval History: She called Melissa complaining of some increasing discomfort and pressure. She primarily notices it when she there bears down or lifts heavy objects. The pain lasted a couple days a couple hours. It is becoming a little more persistent to her. She had repeat imaging 07/07/15 performed that revealed: FINDINGS: Uterus Not visualized compatible with hysterectomy.  Right ovary Not visualized  Left ovary Measurements: 4 x 2.4 x 3.8 cm. Three left ovarian cysts are again noted as follows: A stable 3.3 x 2.3 x 3.4 cm mildly complex cyst with slightly thickened septations and 3 mm mural nodule. A 1.4 x 1 x 0.8 cm simple appearing cyst, previously 1 x 0.8 x 0.8 cm.  A 1.3 x 0.8 x 1.5 cm simple appearing cyst, previously 1 x 0.8 x 1 cm.  No free fluid.  IMPRESSION: Stable 3.3 cm mildly complex left ovarian cyst as described. Consider continued sonographic follow-up in 6 months as clinically indicated. Two 0 other slightly enlarging simple appearing left ovarian cysts. Right ovary not visualized and patient status posthysterectomy.  I reviewed the results of the ultrasound with the patient. I do not believe that this represents a malignancy. Unfortunately the patient did have her husband passed away last year. He had cancer. She is appropriately grieving but states that she is feeling better and crying less. She underwent an uncomplicated laparoscopic cholecystectomy in May of last year. She had no issues with her ITP which is been stable for many years on her prednisone 2.5 mg daily. After discussion today the patient was to continue to follow the pelvic mass expectantly. We will tentatively schedule her for repeat ultrasound in 6 months. I believe the indication for surgery would be either a significant change in the size of the mass are patient's symptomatology. She understands that her symptoms will drive the decision for surgery if her ultrasound is otherwise  fairly stable.  Review of Systems: Constitutional: Denies fever. Skin: No rash, sores, jaundice, itching, or dryness.  Cardiovascular: No chest pain, shortness of breath, slight edema in her left ankle Pulmonary: No cough or wheeze.  Gastro Intestinal:  No nausea, vomiting, constipation, or diarrhea reported. No bright red blood per rectum or change in bowel movement other than as above. She has occasional epigastric discomfort and is seeing Dr. Hilarie Fredrickson in gastroenterology soon. She believes that she'll need an upper endoscopy. She's also having some issues with constipation and feels that she is not emptying her bowels well. She is drinking water, using Citrucel and marrow last without benefit.  Genitourinary: No frequency, urgency, or dysuria.  Denies vaginal bleeding and discharge.  Musculoskeletal: No myalgia, arthralgia, joint swelling or pain other than as above  Neurologic: No weakness, numbness, or change in gait.  Psychology: Sad husband passed away, staying busy.  Current Meds:  Outpatient Encounter Prescriptions as of 07/13/2015  Medication Sig  . ALPRAZolam (XANAX) 0.25 MG tablet Take 1 tablet (0.25 mg total) by mouth 2 (two) times daily as needed for anxiety.  . Biotin 2500 MCG CAPS Take by mouth daily.  . Calcium Citrate (CITRACAL PO) One capful three times a week  . Calcium-Magnesium-Vitamin D ([place] CALCIUM+D) [id number] MG-MG-UNIT  TB24 Take 1 tablet by mouth daily.   . clotrimazole-betamethasone (LOTRISONE) cream Apply 1 application topically 2 (two) times daily as needed.  . dorzolamide-timolol (COSOPT) 22.3-6.8 MG/ML ophthalmic solution Place 1 drop into both eyes 2 (two) times daily.   . Flaxseed MISC by Does not apply route. Take 1 tsp daily  . Magnesium 250 MG TABS Take 250 mg by mouth daily.  . methocarbamol (ROBAXIN) 500 MG tablet Take 1 tablet (500 mg total) by mouth every 8 (eight) hours as needed for muscle spasms.  . Multiple Vitamin (MULTIVITAMIN) tablet Take 1  tablet by mouth daily.    . pantoprazole (PROTONIX) 20 MG tablet Take 1 tablet (20 mg total) by mouth daily.  . polyethylene glycol (MIRALAX / GLYCOLAX) packet Take 17 g by mouth every 3 (three) days.  . predniSONE (DELTASONE) 2.5 MG tablet TAKE ONE TABLET BY MOUTH ONCE DAILY  . Probiotic Product (PROBIOTIC FORMULA PO) Take 1 tablet by mouth daily.    . traMADol (ULTRAM) 50 MG tablet Take 1 tablet (50 mg total) by mouth every 6 (six) hours as needed for moderate pain.   No facility-administered encounter medications on file as of 07/13/2015.    Allergy:  Allergies  Allergen Reactions  . Aspirin Other (See Comments)    ITP  . Contrast Media [Iodinated Diagnostic Agents]     hives  . Statins   . Sulfa Antibiotics Other (See Comments)    dizziness    Social Hx:   Social History   Social History  . Marital Status: Married    Spouse Name: N/A  . Number of Children: N/A  . Years of Education: N/A   Occupational History  . Retired    Social History Main Topics  . Smoking status: Never Smoker   . Smokeless tobacco: Never Used  . Alcohol Use: No     Comment: rarely  . Drug Use: No  . Sexual Activity: Not on file   Other Topics Concern  . 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    Dorie Rank to [place] from [place] 05/2010 to be close to kids    Past Surgical Hx:  Past Surgical History  Procedure Laterality Date  . Splenectomy  1954  . Abdominal hysterectomy  1963  . L pop pta  10/2009    stent  . Laparoscopic sigmoid colectomy  10/2005  . Cataract extraction, bilateral  10/2010  . Knee arthroscopy Right   . Angioplasty    . Cardiac catheterization    . Cholecystectomy N/A 09/16/2014    Procedure: LAPAROSCOPIC CHOLECYSTECTOMY ;  Surgeon: Rolm Bookbinder, MD;  Location: [facility];  Service: General;  Laterality: N/A;  . Appendectomy  1956    Past Medical Hx:  Past Medical History  Diagnosis Date  . OSTEOARTHRITIS,  KNEE, RIGHT   . URINARY INCONTINENCE   . Irritable bowel syndrome   . DIVERTICULITIS, HX OF   . OSTEOPENIA   . Immune thrombocytopenic purpura (HCC)     chronic - baseline 80-100K, on pred  . UNSPECIFIED PERIPHERAL VASCULAR DISEASE   . OVERACTIVE BLADDER   . DYSLIPIDEMIA   . Glaucoma   . GERD   . HOH (hard of hearing)   . CAD (coronary artery disease)     RCA 40% stenosis cath 01/2011  . Left ovarian cyst dx 01/2013 CT    working with gyn, ?malignant - elevated tumor marker OVA1  . Cholelithiasis  s/p lap chole 09/2014  . Elevated LFTs   . Hepatic steatosis   . Anxiety   . Diverticulosis   . Diverticular stricture [facility]) 2006    [facility]    Oncology Hx:   No history exists.    Family Hx:  Family History  Problem Relation Age of Onset  . Coronary artery disease Mother   . Heart attack Mother 70  . Hyperlipidemia Mother   . Hypertension Mother   . Stomach cancer Father   . Hypertension Daughter   . Hyperlipidemia Daughter   . Arthritis      parent  . Transient ischemic attack      parent  . Colon cancer Neg Hx     Vitals:  Blood pressure 143/53, pulse 82, temperature 97.8 F (36.6 C), temperature source Oral, resp. rate 18, height 5\' 2"  (1.575 m), weight 146 lb 3.2 oz (66.316 kg), SpO2 99 %.  Physical Exam: Well-nourished well-developed female in no acute distress.   Assessment/Plan: 82 year old gravida 3 para 2 status post hysterectomy and right salpingo-oophorectomy at the age of 25 or 81 for advanced endometriosis. She currently has a complex left adnexal mass measuring about 3 cm. CA 125 was normal at 13, however, OVA-1 was slightly elevated at 4.7 with the normal reference range of less than 4.4 and a postmenopausal woman.  At this point as it is not appear that she's been a dramatic increase in the size of the cyst and her CA 125 is stable  She is to contact us if she experiences any symptoms. These were again reviewed and include  bloating, pelvic pain or discomfort, change about bladder habits, or early satiety. Her questions were elicited and answered to her satisfaction. She would like to plan a repeat ultrasound in 6 months. That has been scheduled.  Nancy Marus A., MD 07/13/2015, 4:33 PM

## 2015-07-13 NOTE — Patient Instructions (Signed)
Call for worsening of symptoms.  Plan for repeat ultrasound in Sept 2017

## 2015-07-14 ENCOUNTER — Telehealth: Payer: Self-pay | Admitting: *Deleted

## 2015-07-14 NOTE — Telephone Encounter (Signed)
Pt has been notified.../lmb 

## 2015-07-14 NOTE — Telephone Encounter (Signed)
You can let her know she can not get shingles from shingles.  You can only get chicken pox from shingles, which she is not at risk for.  Shingles comes from within your own body not from someone else.

## 2015-07-14 NOTE — Telephone Encounter (Signed)
Receive call pt states want to ask if she need to be worried about getting shingles. Her son was over & was c/o rash, which she applied some hydrocortisone cream on it. He had appt yesterday afternoon and they dx him with shingles. Pt concern about getting it since she was around him & touch him. She stated that she have taken the shingles vaccine. Inform pt that long as he didn't have a fever, and after applying cream she wash her hands she should be ok, but to keep a watch if she develop any type of rash or symptoms to let us know. She states she has her f/u appt w/MD next Tues, but she wanted to let MD know....Johny Chess

## 2015-07-19 ENCOUNTER — Ambulatory Visit (INDEPENDENT_AMBULATORY_CARE_PROVIDER_SITE_OTHER): Payer: Medicare Other | Admitting: Internal Medicine

## 2015-07-19 ENCOUNTER — Encounter: Payer: Self-pay | Admitting: Internal Medicine

## 2015-07-19 VITALS — BP 138/74 | HR 84 | Temp 98.4°F | Resp 16 | Wt 143.0 lb

## 2015-07-19 DIAGNOSIS — F419 Anxiety disorder, unspecified: Secondary | ICD-10-CM | POA: Diagnosis not present

## 2015-07-19 DIAGNOSIS — M79602 Pain in left arm: Secondary | ICD-10-CM

## 2015-07-19 DIAGNOSIS — R269 Unspecified abnormalities of gait and mobility: Secondary | ICD-10-CM

## 2015-07-19 DIAGNOSIS — D693 Immune thrombocytopenic purpura: Secondary | ICD-10-CM

## 2015-07-19 DIAGNOSIS — K219 Gastro-esophageal reflux disease without esophagitis: Secondary | ICD-10-CM | POA: Diagnosis not present

## 2015-07-19 DIAGNOSIS — F32A Depression, unspecified: Secondary | ICD-10-CM | POA: Insufficient documentation

## 2015-07-19 MED ORDER — PREDNISONE 2.5 MG PO TABS: 2.5000 mg | ORAL_TABLET | Freq: Every day | ORAL | Status: DC

## 2015-07-19 NOTE — Assessment & Plan Note (Addendum)
Overall controlled On protonix 20 mg daily Continue daily medication

## 2015-07-19 NOTE — Progress Notes (Signed)
Pre visit review using our clinic review tool, if applicable. No additional management support is needed unless otherwise documented below in the visit note. 

## 2015-07-19 NOTE — Assessment & Plan Note (Signed)
Following with oncology Taking prednisone 2.5 every other day Platelets stable

## 2015-07-19 NOTE — Patient Instructions (Addendum)
   Medications reviewed and updated.  No changes recommended at this time.  Your prescription(s) have been submitted to your pharmacy. Please take as directed and contact our office if you believe you are having problem(s) with the medication(s).   Please followup as scheduled

## 2015-07-19 NOTE — Progress Notes (Signed)
Subjective:    Patient ID: Tamara Robertson, female    DOB: Jan 25, 1930, 82 y.o.   MRN: [id number]  HPI  She is here to establish with a new pcp.  She is here for follow up.   Unsteady on her feet:  She feels unstable when she is walking.  She feels like she is veering off the the right sometimes.  She feels lightheaded at times when she stands up and is walking.  She had a bad fall in 23-Apr-2023 and had a ct scan, which was normal.  She wonders if that was the cause.  Everything was ok after the fall.  She noticed this about two months ago.   Left arm pain:  A couple of times a week she gets a sharp pain in the left arm.  It usually comes in the evening.  It may last for 4 + hours - it resolves overnight.  It does not occur daily, may be a couple of times a week.  She denies chest pain, sob and lightheadedness.  She does not have any symptoms when she exercises.  She had a stress test a year and a half ago.      GERD:  She is taking her medication daily as prescribed.  She denies any GERD symptoms, except rarely and feels her GERD is well controlled.   ITP:  She takes one prednisone every other day. She is following with oncology.  Anxiety: she uses the xanax only as needed, which is not often.  She tries to avoid taking it.   She is exercising regularly - walking and does chair yoga.    Her husband died in 23-Apr-2023 of cancer.  She feels she is doing well.    Medications and allergies reviewed with patient and updated if appropriate.  Patient Active Problem List   Diagnosis Date Noted  . Degenerative cervical disc 12/28/2014  . S/P laparoscopic cholecystectomy 09/16/2014  . Calculus of gallbladder without cholecystitis without obstruction 06/21/2014  . Left ovarian cyst   . Peripheral neuropathy (HCC) 06/03/2012  . Insomnia   . PVD (peripheral vascular disease) (HCC) 11/23/2010  . Bursitis of hip 10/26/2010  . Hyperlipidemia 06/27/2010  . IMMUNE THROMBOCYTOPENIC PURPURA 06/27/2010  .  GLUCOMA 06/27/2010  . GERD 06/27/2010  . IRRITABLE BOWEL SYNDROME 06/27/2010  . OVERACTIVE BLADDER 06/27/2010  . OSTEOARTHRITIS, KNEE, RIGHT 06/27/2010  . ARTHRITIS 06/27/2010  . URINARY INCONTINENCE 06/27/2010    Current Outpatient Prescriptions on File Prior to Visit  Medication Sig Dispense Refill  . ALPRAZolam (XANAX) 0.25 MG tablet Take 1 tablet (0.25 mg total) by mouth 2 (two) times daily as needed for anxiety. 30 tablet 1  . Biotin 2500 MCG CAPS Take by mouth daily.    . Calcium Citrate (CITRACAL PO) One capful three times a week    . Calcium-Magnesium-Vitamin D (CITRACAL CALCIUM+D) 600-40-500 MG-MG-UNIT TB24 Take 1 tablet by mouth daily.     . clotrimazole-betamethasone (LOTRISONE) cream Apply 1 application topically 2 (two) times daily as needed. 15 g 1  . dorzolamide-timolol (COSOPT) 22.3-6.8 MG/ML ophthalmic solution Place 1 drop into both eyes 2 (two) times daily.     . Flaxseed MISC by Does not apply route. Take 1 tsp daily    . Magnesium 250 MG TABS Take 250 mg by mouth daily.    . methocarbamol (ROBAXIN) 500 MG tablet Take 1 tablet (500 mg total) by mouth every 8 (eight) hours as needed for muscle spasms. 20 tablet 1  .  Multiple Vitamin (MULTIVITAMIN) tablet Take 1 tablet by mouth daily.      . pantoprazole (PROTONIX) 20 MG tablet Take 1 tablet (20 mg total) by mouth daily. 90 tablet 3  . polyethylene glycol (MIRALAX / GLYCOLAX) packet Take 17 g by mouth every 3 (three) days.    . predniSONE (DELTASONE) 2.5 MG tablet TAKE ONE TABLET BY MOUTH ONCE DAILY 30 tablet 0  . Probiotic Product (PROBIOTIC FORMULA PO) Take 1 tablet by mouth daily.      . traMADol (ULTRAM) 50 MG tablet Take 1 tablet (50 mg total) by mouth every 6 (six) hours as needed for moderate pain. 60 tablet 1   No current facility-administered medications on file prior to visit.    Past Medical History  Diagnosis Date  . OSTEOARTHRITIS, KNEE, RIGHT   . URINARY INCONTINENCE   . Irritable bowel syndrome   .  DIVERTICULITIS, HX OF   . OSTEOPENIA   . Immune thrombocytopenic purpura (HCC)     chronic - baseline 80-100K, on pred  . UNSPECIFIED PERIPHERAL VASCULAR DISEASE   . OVERACTIVE BLADDER   . DYSLIPIDEMIA   . Glaucoma   . GERD   . HOH (hard of hearing)   . CAD (coronary artery disease)     RCA 40% stenosis cath 01/2011  . Left ovarian cyst dx 01/2013 CT    working with gyn, ?malignant - elevated tumor marker OVA1  . Cholelithiasis     s/p lap chole 09/2014  . Elevated LFTs   . Hepatic steatosis   . Anxiety   . Diverticulosis   . Diverticular stricture [facility]) 2006    [facility]    Past Surgical History  Procedure Laterality Date  . Splenectomy  1954  . Abdominal hysterectomy  1963  . L pop pta  10/2009    stent  . Laparoscopic sigmoid colectomy  10/2005  . Cataract extraction, bilateral  10/2010  . Knee arthroscopy Right   . Angioplasty    . Cardiac catheterization    . Cholecystectomy N/A 09/16/2014    Procedure: LAPAROSCOPIC CHOLECYSTECTOMY ;  Surgeon: Emelia Loron, MD;  Location: [facility] OR;  Service: General;  Laterality: N/A;  . Appendectomy  1956    Social History   Social History  . Marital Status: Married    Spouse Name: N/A  . Number of Children: N/A  . Years of Education: N/A   Occupational History  . Retired    Social History Main Topics  . Smoking status: Never Smoker   . Smokeless tobacco: Never Used  . Alcohol Use: No     Comment: rarely  . Drug Use: No  . Sexual Activity: Not Asked   Other Topics Concern  . 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    [place] to [place] from [place] 05/2010 to be close to kids    Family History  Problem Relation Age of Onset  . Coronary artery disease Mother   . Heart attack Mother 69  . Hyperlipidemia Mother   . Hypertension Mother   . Stomach cancer Father   . Hypertension Daughter   . Hyperlipidemia Daughter   . Arthritis      parent    . Transient ischemic attack      parent  . Colon cancer Neg Hx     Review of Systems  Constitutional: Negative for fever and appetite change.  HENT: Positive for postnasal drip.  Respiratory: Negative for cough, shortness of breath and wheezing.   Cardiovascular: Positive for leg swelling (chronic). Negative for chest pain and palpitations.  Gastrointestinal: Positive for abdominal pain (LLQ - ovarian cyst - following with gyn, also has IBS).  Neurological: Positive for light-headedness and headaches (occasional). Negative for dizziness.       No falls       Objective:   Filed Vitals:   07/19/15 1122  BP: 138/74  Pulse: 84  Temp: 98.4 F (36.9 C)  Resp: 16   Filed Weights   07/19/15 1122  Weight: 143 lb (64.864 kg)   Body mass index is 26.15 kg/(m^2).   Physical Exam Constitutional: Appears well-developed and well-nourished. No distress.  Neck: Neck supple. No tracheal deviation present. No thyromegaly present.  No carotid bruit. No cervical adenopathy.   Cardiovascular: Normal rate, regular rhythm and normal heart sounds.   No murmur heard.  No edema Pulmonary/Chest: Effort normal and breath sounds normal. No respiratory distress. No wheezes.  Psych:  Normal mood and affect      Assessment & Plan:   Left arm pain Normal stress test one and a half year ago Unlikely cardiac nature She does see cardiology Asymptomatic with exercise She will monitor for now - if symptoms continue will refer to cardio  Unsteady gait Will refer to neuro Ct scan of head normal in november  See Problem List for Assessment and Plan of chronic medical problems.  Follow up in July

## 2015-08-05 ENCOUNTER — Encounter: Payer: Self-pay | Admitting: Neurology

## 2015-08-05 ENCOUNTER — Ambulatory Visit (INDEPENDENT_AMBULATORY_CARE_PROVIDER_SITE_OTHER): Payer: Medicare Other | Admitting: Neurology

## 2015-08-05 ENCOUNTER — Other Ambulatory Visit: Payer: Medicare Other

## 2015-08-05 VITALS — BP 148/80 | HR 80 | Ht 62.0 in | Wt 146.0 lb

## 2015-08-05 DIAGNOSIS — R2681 Unsteadiness on feet: Secondary | ICD-10-CM | POA: Diagnosis not present

## 2015-08-05 DIAGNOSIS — G609 Hereditary and idiopathic neuropathy, unspecified: Secondary | ICD-10-CM

## 2015-08-05 DIAGNOSIS — G9340 Encephalopathy, unspecified: Secondary | ICD-10-CM

## 2015-08-05 DIAGNOSIS — R42 Dizziness and giddiness: Secondary | ICD-10-CM

## 2015-08-05 DIAGNOSIS — R739 Hyperglycemia, unspecified: Secondary | ICD-10-CM | POA: Diagnosis not present

## 2015-08-05 LAB — TSH: TSH: 2.33 m[IU]/L

## 2015-08-05 LAB — FOLATE

## 2015-08-05 LAB — VITAMIN B12: VITAMIN B 12: 628 pg/mL (ref 200–1100)

## 2015-08-05 NOTE — Progress Notes (Signed)
Tamara Robertson was seen today in neurologic consultation at the request of Binnie Rail, MD.  The consultation is for the evaluation of veering the right when ambulating.  Pt states that sx's began about 3 months ago.  She states that she feels like she is a "car out of alignment" and always "has to correct."  She also c/o that her balance is not good but there is no falls.  It also has been going on for 3 months or so and she feels that the 2 issues are related.  No paresthesias of the feet.  No DM.  She does note that her husband died in 2023/03/17.  That same day she had a big fall when she was trying to answer the door.  She caught her shoe on the floor when trying to turn and hit the head on the door jam. The patient did have a CT of the brain after that fall on 03/10/2015.  I had the opportunity to review this.  It was unremarkable with the exception of a left parietal scalp hematoma.  She denies new medications.  She has xanax listed as a medication but she rarely takes it.  She states that she tried a 1/2 tablet the other day to see if it would help and she "slept the entire day."   Also c/o paresthesias in the scalp and a lightheaded feeling.  Occasionally, and only momentarily she will have deja vu feeling and then it is gone.     ALLERGIES:   Allergies  Allergen Reactions  . Aspirin Other (See Comments)    ITP  . Contrast Media [Iodinated Diagnostic Agents]     hives  . Statins   . Sulfa Antibiotics Other (See Comments)    dizziness    CURRENT MEDICATIONS:  Outpatient Encounter Prescriptions as of 08/05/2015  Medication Sig  . ALPRAZolam (XANAX) 0.25 MG tablet Take 1 tablet (0.25 mg total) by mouth 2 (two) times daily as needed for anxiety.  . Biotin 2500 MCG CAPS Take by mouth daily.  . Calcium Citrate (CITRACAL PO) One capful three times a week  . Calcium-Magnesium-Vitamin D (CITRACAL CALCIUM+D) 600-40-500 MG-MG-UNIT TB24 Take 1 tablet by mouth daily.   .  clotrimazole-betamethasone (LOTRISONE) cream Apply 1 application topically 2 (two) times daily as needed.  . dorzolamide-timolol (COSOPT) 22.3-6.8 MG/ML ophthalmic solution Place 1 drop into both eyes 2 (two) times daily.   . Flaxseed MISC by Does not apply route. Take 1 tsp daily  . Magnesium 250 MG TABS Take 250 mg by mouth daily.  . methocarbamol (ROBAXIN) 500 MG tablet Take 1 tablet (500 mg total) by mouth every 8 (eight) hours as needed for muscle spasms.  . Multiple Vitamin (MULTIVITAMIN) tablet Take 1 tablet by mouth daily.    . pantoprazole (PROTONIX) 20 MG tablet Take 1 tablet (20 mg total) by mouth daily.  . polyethylene glycol (MIRALAX / GLYCOLAX) packet Take 17 g by mouth every 3 (three) days.  . predniSONE (DELTASONE) 2.5 MG tablet Take 1 tablet (2.5 mg total) by mouth daily.  . Probiotic Product (PROBIOTIC FORMULA PO) Take 1 tablet by mouth daily.    . traMADol (ULTRAM) 50 MG tablet Take 1 tablet (50 mg total) by mouth every 6 (six) hours as needed for moderate pain.   No facility-administered encounter medications on file as of 08/05/2015.    PAST MEDICAL HISTORY:   Past Medical History  Diagnosis Date  . OSTEOARTHRITIS, KNEE, RIGHT   .  URINARY INCONTINENCE   . Irritable bowel syndrome   . DIVERTICULITIS, HX OF   . OSTEOPENIA   . Immune thrombocytopenic purpura (HCC)     chronic - baseline 80-100K, on pred  . UNSPECIFIED PERIPHERAL VASCULAR DISEASE   . OVERACTIVE BLADDER   . DYSLIPIDEMIA   . Glaucoma   . GERD   . HOH (hard of hearing)   . CAD (coronary artery disease)     RCA 40% stenosis cath 01/2011  . Left ovarian cyst dx 01/2013 CT    working with gyn, ?malignant - elevated tumor marker OVA1  . Cholelithiasis     s/p lap chole 09/2014  . Elevated LFTs   . Hepatic steatosis   . Anxiety   . Diverticulosis   . Diverticular stricture [facility]) 2006    [facility]    PAST SURGICAL HISTORY:   Past Surgical History  Procedure Laterality Date  .  Splenectomy  1954  . Abdominal hysterectomy  1963  . L pop pta  10/2009    stent  . Laparoscopic sigmoid colectomy  10/2005  . Cataract extraction, bilateral  10/2010  . Knee arthroscopy Right   . Angioplasty    . Cardiac catheterization    . Cholecystectomy N/A 09/16/2014    Procedure: LAPAROSCOPIC CHOLECYSTECTOMY ;  Surgeon: Rolm Bookbinder, MD;  Location: [place];  Service: General;  Laterality: N/A;  . Appendectomy  1956    SOCIAL HISTORY:   Social History   Social History  . Marital Status: Married    Spouse Name: N/A  . Number of Children: N/A  . Years of Education: N/A   Occupational History  . Retired     Retail banker   Social History Main Topics  . Smoking status: Never Smoker   . Smokeless tobacco: Never Used  . Alcohol Use: No     Comment: rarely  . Drug Use: No  . Sexual Activity: Not on file   Other Topics Concern  . 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    [place] Rank to [place] from [place] 05/2010 to be close to kids    FAMILY HISTORY:   Family Status  Relation Status Death Age  . Mother Deceased 75    TIA/ MI  . Father Deceased 70    stomach cancer  . Child Deceased 37    1, MVA  . Child Alive     2, healthy    ROS:  Occ pain in early evening in L biceps but rarely.  A complete 10 system review of systems was obtained and was unremarkable apart from what is mentioned above.  PHYSICAL EXAMINATION:    VITALS:   Filed Vitals:   08/05/15 1240  BP: 148/80  Pulse: 80  Height: 5\' 2"  (1.575 m)  Weight: 146 lb (66.225 kg)    GEN:  Normal appears female in no acute distress.  Appears stated age. HEENT:  Normocephalic, atraumatic. The mucous membranes are moist. The superficial temporal arteries are without ropiness or tenderness. Cardiovascular: Regular rate and rhythm. Lungs: Clear to auscultation bilaterally. Neck/Heme: There are no carotid bruits noted  bilaterally.  NEUROLOGICAL: Orientation:  The patient is alert and oriented x 3.  Fund of knowledge is appropriate.  Recent and remote memory intact.  Attention span and concentration normal.  Repeats and names without difficulty. Cranial nerves: There is good facial symmetry. The pupils are equal round and  reactive to light bilaterally. Fundoscopic exam reveals clear disc margins bilaterally. Extraocular muscles are intact and visual fields are full to confrontational testing. Speech is fluent and clear. Soft palate rises symmetrically and there is no tongue deviation. Hearing is intact to conversational tone. Tone: Tone is good throughout. Sensation: Sensation is intact to light touch and pinprick throughout (facial, trunk, extremities). Pinprick is decreased in a stocking distribution.  Vibration is decreased at the bilateral big toe. There is no extinction with double simultaneous stimulation. There is no sensory dermatomal level identified. Coordination:  The patient has no difficulty with RAM's or FNF bilaterally. Motor: Strength is 5/5 in the bilateral upper and lower extremities.  Shoulder shrug is equal and symmetric. There is no pronator drift.  There are no fasciculations noted. DTR's: Deep tendon reflexes are 2/4 at the bilateral biceps, triceps, brachioradialis, patella and 1/4 at the bilateral achilles.  Plantar responses are downgoing bilaterally. Gait and Station: The patient is able to ambulate without difficulty. The patient is able to heel toe walk without any difficulty. The patient is able to ambulate in a tandem fashion. The patient is able to stand in the Romberg position.  LABS    Chemistry      Component Value Date/Time   NA 141 07/01/2015 1424   NA 141 04/21/2015 1114   K 4.2 07/01/2015 1424   K 4.8 04/21/2015 1114   CL 103 04/21/2015 1114   CL 105 04/17/2012 1452   CO2 28 07/01/2015 1424   CO2 33* 04/21/2015 1114   BUN 15.5 07/01/2015 1424   BUN 14 04/21/2015 1114    CREATININE 0.8 07/01/2015 1424   CREATININE 0.81 04/21/2015 1114      Component Value Date/Time   CALCIUM 9.2 07/01/2015 1424   CALCIUM 9.9 04/21/2015 1114   ALKPHOS 109 07/01/2015 1424   ALKPHOS 87 04/21/2015 1114   AST 19 07/01/2015 1424   AST 20 04/21/2015 1114   ALT 12 07/01/2015 1424   ALT 13 04/21/2015 1114   BILITOT <0.30 07/01/2015 1424   BILITOT 0.5 04/21/2015 1114     Lab Results  Component Value Date   TSH 2.95 06/03/2012   Lab Results  Component Value Date   VITAMINB12 467 06/03/2012      IMPRESSION/PLAN  1. Gait instability  -pt definitely has physical examination evidence of peripheral neuropathy, which could explain the ataxia and loss of balance.   -Labs ordered:  B12, Folate, RPR, SPEP and UPEP with immunofixation, hbbA1C (hx hyperglycemia)  -start PT for gait and balance  -safety associated with PN discussed/pt education provided  -discussed EMG and she wishes to hold for now 2.  Lightheadedness/feeling of deja vu  -will check MRI  -will do EEG  -orthostatics negative 3.  Told her that we would stay in contact over the telephone.  We will determine follow-up based on the above.  She is always welcome to make a follow-up appointment if she needs to be seen.  Much greater than 50% of this visit was spent in counseling and coordinating care.  Total face to face time:  60 min

## 2015-08-05 NOTE — Patient Instructions (Signed)
1. Your provider has requested that you have labwork completed today. Please go to [facility] ([id number]) on the second floor of this building before leaving the office today. You do not need to check in. If you are not called within 15 minutes please check with the front desk.  2. Schedule EEG.  3. Referral sent to [facility] [place]. They will call you directly to set up physical therapy.  4. We have scheduled you at [facility] for your MRI on 08/15/2015 at 3:00 pm. Please arrive 15 minutes prior and go to 1st floor radiology. If you need to reschedule for any reason please call [phone].

## 2015-08-06 LAB — RPR

## 2015-08-06 LAB — HEMOGLOBIN A1C
Hgb A1c MFr Bld: 5.8 % — ABNORMAL HIGH (ref <5.7)
Mean Plasma Glucose: 120 mg/dL

## 2015-08-08 ENCOUNTER — Other Ambulatory Visit: Payer: Medicare Other

## 2015-08-09 LAB — PROTEIN ELECTROPHORESIS,RANDOM URN
CREATININE, URINE: 12 mg/dL — AB (ref 20–320)
Total Protein, Urine: <4 mg/dL — ABNORMAL LOW (ref 5–24)

## 2015-08-09 LAB — PROTEIN ELECTROPHORESIS, SERUM
ALBUMIN ELP: 4.3 g/dL (ref 3.8–4.8)
Alpha-1-Globulin: 0.3 g/dL (ref 0.2–0.3)
Alpha-2-Globulin: 0.8 g/dL (ref 0.5–0.9)
BETA GLOBULIN: 0.5 g/dL (ref 0.4–0.6)
Beta 2: 0.6 g/dL — ABNORMAL HIGH (ref 0.2–0.5)
Gamma Globulin: 1 g/dL (ref 0.8–1.7)
TOTAL PROTEIN, SERUM ELECTROPHOR: 7.5 g/dL (ref 6.1–8.1)

## 2015-08-09 LAB — IMMUNOFIXATION ELECTROPHORESIS
IGG (IMMUNOGLOBIN G), SERUM: 1120 mg/dL (ref 690–1700)
IgA: 419 mg/dL — ABNORMAL HIGH (ref 69–380)
IgM, Serum: 37 mg/dL — ABNORMAL LOW (ref 52–322)

## 2015-08-10 ENCOUNTER — Ambulatory Visit (INDEPENDENT_AMBULATORY_CARE_PROVIDER_SITE_OTHER): Payer: Medicare Other | Admitting: Neurology

## 2015-08-10 DIAGNOSIS — G609 Hereditary and idiopathic neuropathy, unspecified: Secondary | ICD-10-CM

## 2015-08-10 DIAGNOSIS — R42 Dizziness and giddiness: Secondary | ICD-10-CM | POA: Diagnosis not present

## 2015-08-10 LAB — IMMUNOFIXATION INTE

## 2015-08-11 ENCOUNTER — Telehealth: Payer: Self-pay | Admitting: Neurology

## 2015-08-11 NOTE — Telephone Encounter (Signed)
Patient made aware.

## 2015-08-11 NOTE — Telephone Encounter (Signed)
Let pt know that labs and EEG are unremarkable...and thank her for the brownies!

## 2015-08-11 NOTE — Procedures (Signed)
TECHNICAL SUMMARY:  A multichannel referential and bipolar montage EEG using the standard international 10-20 system was performed on the patient described as awake, drowsy and asleep.  The dominant background activity consists of 10-11 hertz activity seen most prominantly over the posterior head region.  The backgound activity is reactive to eye opening and closing procedures.  Low voltage fast (beta) activity is distributed symmetrically and maximally over the anterior head regions.  ACTIVATION:  Stepwise photic stimulation at 4-20 flashes per second was performed and did not elicit any abnormal waveforms but did produce a symmetric driving response.  Hyperventilation was performed for 3 minutes with good patient effort and produced no changes in the background activity.  EPILEPTIFORM ACTIVITY:  There were no spikes, sharp waves or paroxysmal activity.  SLEEP:  Most of the recording was spent in stage II sleep architecture  CARDIAC:  The EKG lead revealed a regular sinus rhythm.  IMPRESSION:  This is a normal EEG for the patients stated age.  There were no focal, hemispheric or lateralizing features.  No epileptiform activity was recorded.  A normal EEG does not exclude the diagnosis of a seizure disorder and if seizure remains high on the list of differential diagnosis, an ambulatory EEG may be of value.  Clinical correlation is required.

## 2015-08-15 ENCOUNTER — Ambulatory Visit (HOSPITAL_COMMUNITY): Payer: Medicare Other

## 2015-08-16 ENCOUNTER — Ambulatory Visit (HOSPITAL_COMMUNITY)
Admission: RE | Admit: 2015-08-16 | Discharge: 2015-08-16 | Disposition: A | Discharge status: Home / Self Care | Payer: Medicare Other | Source: Ambulatory Visit | Attending: Neurology | Admitting: Neurology

## 2015-08-16 DIAGNOSIS — G609 Hereditary and idiopathic neuropathy, unspecified: Secondary | ICD-10-CM | POA: Diagnosis not present

## 2015-08-16 DIAGNOSIS — R9089 Other abnormal findings on diagnostic imaging of central nervous system: Secondary | ICD-10-CM | POA: Insufficient documentation

## 2015-08-16 DIAGNOSIS — G9340 Encephalopathy, unspecified: Secondary | ICD-10-CM

## 2015-08-16 DIAGNOSIS — R2681 Unsteadiness on feet: Secondary | ICD-10-CM | POA: Diagnosis not present

## 2015-08-16 DIAGNOSIS — R42 Dizziness and giddiness: Secondary | ICD-10-CM

## 2015-08-17 ENCOUNTER — Telehealth: Payer: Self-pay | Admitting: Neurology

## 2015-08-17 NOTE — Telephone Encounter (Signed)
-----   Message from [place], DO sent at 08/17/2015  8:12 AM EDT ----- Reviewed and agree.  There is a larger T2 hyperintensity in the L parietal region (? Old infarct) but seen on prior scan).  Otherwise very mild small vessel disease.  Jade, please tell pt that there is nothing new on scan compared to 2016 CT brain

## 2015-08-17 NOTE — Telephone Encounter (Signed)
Patient made aware of results.  

## 2015-08-17 NOTE — Telephone Encounter (Signed)
Left message on machine for patient to call back.

## 2015-08-18 ENCOUNTER — Ambulatory Visit: Payer: Medicare Other

## 2015-08-23 DIAGNOSIS — H00024 Hordeolum internum left upper eyelid: Secondary | ICD-10-CM | POA: Diagnosis not present

## 2015-08-25 ENCOUNTER — Ambulatory Visit: Payer: Medicare Other | Attending: Neurology | Admitting: Physical Therapy

## 2015-08-25 DIAGNOSIS — R2681 Unsteadiness on feet: Secondary | ICD-10-CM | POA: Diagnosis not present

## 2015-08-25 NOTE — Therapy (Signed)
[facility] [address] [place], [place], [address] Phone: [phone]   Fax:  [phone]  Physical Therapy Evaluation/1x visit   Patient Details  Name: Tamara Robertson MRN: [id number] Date of Birth: 11/25/1929 Referring Provider: Wells Guiles Tat  Encounter Date: 08/25/2015      PT End of Session - 08/25/15 1712    Visit Number 1   Number of Visits 1   Authorization Type U[employer] Medicare G codes   PT Start Time 1455   PT Stop Time 1530   PT Time Calculation (min) 35 min   Activity Tolerance Patient tolerated treatment well      Past Medical History  Diagnosis Date  . OSTEOARTHRITIS, KNEE, RIGHT   . URINARY INCONTINENCE   . Irritable bowel syndrome   . DIVERTICULITIS, HX OF   . OSTEOPENIA   . Immune thrombocytopenic purpura (HCC)     chronic - baseline 80-100K, on pred  . UNSPECIFIED PERIPHERAL VASCULAR DISEASE   . OVERACTIVE BLADDER   . DYSLIPIDEMIA   . Glaucoma   . GERD   . HOH (hard of hearing)   . CAD (coronary artery disease)     RCA 40% stenosis cath 01/2011  . Left ovarian cyst dx 01/2013 CT    working with gyn, ?malignant - elevated tumor marker OVA1  . Cholelithiasis     s/p lap chole 09/2014  . Elevated LFTs   . Hepatic steatosis   . Anxiety   . Diverticulosis   . Diverticular stricture [facility]) 2006    [facility]    Past Surgical History  Procedure Laterality Date  . Splenectomy  1954  . Abdominal hysterectomy  1963  . L pop pta  10/2009    stent  . Laparoscopic sigmoid colectomy  10/2005  . Cataract extraction, bilateral  10/2010  . Knee arthroscopy Right   . Angioplasty    . Cardiac catheterization    . Cholecystectomy N/A 09/16/2014    Procedure: LAPAROSCOPIC CHOLECYSTECTOMY ;  Surgeon: Rolm Bookbinder, MD;  Location: [place];  Service: General;  Laterality: N/A;  . Appendectomy  1956    There were no vitals filed for this visit.       Subjective Assessment -  08/25/15 1456    Subjective Pt. arrives 15 min late, got lost.   complains of stubbing her toes and feet when walking, veers to right when walking outside periodically. Reports gait is slower.  Used to walk 6-7 blocks.  Now walks < 1 mile/3x week. Saw neurology.  No pain but tingling off/on.  Belongs to a gym.     Limitations Walking   Diagnostic tests MRI, EEG negative   Patient Stated Goals I don't know what this neuropathy is, wants to know how to cope with it.;  quickly get to home program/gym, concerns about 40$ co-pay   Currently in Pain? No/denies            [facility] PT Assessment - 08/25/15 0001    Assessment   Medical Diagnosis peripheral neuropathy left > right   Referring Provider Wells Guiles Tat   Onset Date/Surgical Date --  6 months   Hand Dominance Right   Next MD Visit as needed   Prior Therapy for LBP  sitting, riding in the car, lifting   Precautions   Precautions None   Restrictions   Weight Bearing Restrictions No   Balance Screen   Has the patient fallen in the past 6 months Yes  PT SHORT TERM GOAL #1   Title One time visit           PT Long Term Goals - 08-27-2015 1823    PT LONG TERM GOAL #1   Title The patient will be instructed in a HEP for balance improvement    Time 1   Period Days   Status Achieved               Plan - 08-27-15 1713    Clinical Impression Statement The patient is a low complexity evaluation.  She reports she has a 6 month history of occasional stubbing of her toes and veering to the right at times.  She has a history of vascular issues and peripheral neuropathy.  She reports she walks nearly a mile 3x/week.  She has been attending yoga class and started going to a balance ex class as well.   LE AROM and strength are all WFLs.   She is able to rise from a standard chair without UE use with ease.  She has a gait speed WFLs for her age range.  Her BERG balance test is 54/56 indicating very low risk of falls.  Dynamic Gait Index 17/18.  TUG WFLs of age range.  The patient expresses that she is concerned about her insurance co-pay and given that her deficits were minimal, she requests a basic balance HEP and further treatment will be deferred at this time.  She agrees that if status changes she will  seek further care at that time.     Rehab Potential Good   PT Frequency One time visit      Patient will benefit from skilled therapeutic intervention in order to improve the following deficits and impairments:  Decreased balance  Visit Diagnosis: Unsteadiness on feet      G-Codes - 2015/08/27 1824    Functional Assessment Tool Used BERG; DGI; clinical judgement   Functional Limitation Mobility: Walking and moving around   Mobility: Walking and Moving Around Current Status [id number]) At least 40 percent but less than 60 percent impaired, limited or restricted    Mobility: Walking and Moving Around Goal Status [phone]) At least 40 percent but less than 60 percent impaired, limited or restricted   Mobility: Walking and Moving Around Discharge Status [phone]) At least 40 percent but less than 60 percent impaired, limited or restricted       Problem List Patient Active Problem List   Diagnosis Date Noted  . Anxiety 07/19/2015  . Degenerative cervical disc 12/28/2014  . S/P laparoscopic cholecystectomy 09/16/2014  . Left ovarian cyst   . Peripheral neuropathy ([place]) 06/03/2012  . Insomnia   . PVD (peripheral vascular disease) ([place]) 11/23/2010  . Bursitis of hip 10/26/2010  . Hyperlipidemia 06/27/2010  . IMMUNE THROMBOCYTOPENIC PURPURA 06/27/2010  . GLUCOMA 06/27/2010  . GERD 06/27/2010  . IRRITABLE BOWEL SYNDROME 06/27/2010  . OVERACTIVE BLADDER 06/27/2010  . OSTEOARTHRITIS, KNEE, RIGHT 06/27/2010  . ARTHRITIS 06/27/2010  . URINARY INCONTINENCE 06/27/2010   Ruben Im, PT 08/27/2015 6:28 PM Phone: [phone] Fax: [phone]   Alvera Singh August 27, 2015, 6:27 PM  [facility] [address] [place], [place], [address] Phone: [phone]   Fax:  [phone]  Name: KIERNAN DOTTERY MRN: [id number] Date of Birth: 1929-05-12  [facility] [address] [place], [place], [address] Phone: [phone]   Fax:  [phone]  Physical Therapy Evaluation/1x visit   Patient Details  Name: Tamara Robertson MRN: [id number] Date of Birth: 11/25/1929 Referring Provider: Wells Guiles Tat  Encounter Date: 08/25/2015      PT End of Session - 08/25/15 1712    Visit Number 1   Number of Visits 1   Authorization Type UHC Medicare G codes   PT Start Time 1455   PT Stop Time 1530   PT Time Calculation (min) 35 min   Activity Tolerance Patient tolerated treatment well      Past Medical History  Diagnosis Date  . OSTEOARTHRITIS, KNEE, RIGHT   . URINARY INCONTINENCE   . Irritable bowel syndrome   . DIVERTICULITIS, HX OF   . OSTEOPENIA   . Immune thrombocytopenic purpura (HCC)     chronic - baseline 80-100K, on pred  . UNSPECIFIED PERIPHERAL VASCULAR DISEASE   . OVERACTIVE BLADDER   . DYSLIPIDEMIA   . Glaucoma   . GERD   . HOH (hard of hearing)   . CAD (coronary artery disease)     RCA 40% stenosis cath 01/2011  . Left ovarian cyst dx 01/2013 CT    working with gyn, ?malignant - elevated tumor marker OVA1  . Cholelithiasis     s/p lap chole 09/2014  . Elevated LFTs   . Hepatic steatosis   . Anxiety   . Diverticulosis   . Diverticular stricture [facility]) 2006    [facility]    Past Surgical History  Procedure Laterality Date  . Splenectomy  1954  . Abdominal hysterectomy  1963  . L pop pta  10/2009    stent  . Laparoscopic sigmoid colectomy  10/2005  . Cataract extraction, bilateral  10/2010  . Knee arthroscopy Right   . Angioplasty    . Cardiac catheterization    . Cholecystectomy N/A 09/16/2014    Procedure: LAPAROSCOPIC CHOLECYSTECTOMY ;  Surgeon: Rolm Bookbinder, MD;  Location: [place];  Service: General;  Laterality: N/A;  . Appendectomy  1956    There were no vitals filed for this visit.       Subjective Assessment -  08/25/15 1456    Subjective Pt. arrives 15 min late, got lost.   complains of stubbing her toes and feet when walking, veers to right when walking outside periodically. Reports gait is slower.  Used to walk 6-7 blocks.  Now walks < 1 mile/3x week. Saw neurology.  No pain but tingling off/on.  Belongs to a gym.     Limitations Walking   Diagnostic tests MRI, EEG negative   Patient Stated Goals I don't know what this neuropathy is, wants to know how to cope with it.;  quickly get to home program/gym, concerns about 40$ co-pay   Currently in Pain? No/denies            [facility] PT Assessment - 08/25/15 0001    Assessment   Medical Diagnosis peripheral neuropathy left > right   Referring Provider Wells Guiles Tat   Onset Date/Surgical Date --  6 months   Hand Dominance Right   Next MD Visit as needed   Prior Therapy for LBP  sitting, riding in the car, lifting   Precautions   Precautions None   Restrictions   Weight Bearing Restrictions No   Balance Screen   Has the patient fallen in the past 6 months Yes

## 2015-08-25 NOTE — Patient Instructions (Signed)
Single Leg - Eyes Open  Holding support, lift right leg while maintaining balance over other leg. Progress to removing hands from support surface for longer periods of time. Hold___20_ seconds. Repeat ___1-3_ times per session. Do ____ sessions per day. Single Leg (Compliant Surface) - Eyes Open  Stand on compliant surface: ________ holding support. Lift right leg while maintaining balance over other leg. Progress to removing hands from support surface for longer periods of time. Hold__20__ seconds. Repeat _1-3___ times per session. Do ____ sessions per day.  Feet Heel-Toe "Tandem", Arm Motion  Eyes Open  With eyes open, right foot directly in front of the other, move arms up and down: to front. Repeat _1-3___ times per session. Do ____ sessions per day. Feet Heel-Toe "Tandem" (Compliant Surface) Arm Motion - Eyes Open  With eyes open, standing on compliant surface: ________, right foot directly in front of the other, move arms up and down: to front. Repeat __1-3__ times per session. Do __1__ per day.  Feet Heel-Toe "Tandem"   Arms outstretched, walk a straight line bringing one foot directly in front of the other. Repeat for __1-3__ minutes per session. _ per day. Side-Stepping   Walk to left side with eyes open. Take even steps, leading with same foot. Repeat in other direction. Repeat for 1-3_ minutes per session.Do _1_ per day.   Braiding   Move to side: 1) cross right leg in front of left, 2) bring back leg out to side, then 3) cross right leg behind left, 4) bring left leg out to side. Continue sequence in same direction. Reverse sequence, moving in opposite direction. Repeat sequence ___1-3_ times per session. Do _1___ sessions per day. Repeat on compliant surface: ________.  Copyright  VHI. All rights reserved.   Ruben Im PT [facility] [address], [place], [place] [address] Phone # [phone] Fax [phone]

## 2015-09-01 NOTE — Progress Notes (Signed)
HPI The patient presents for evaluation of chest discomfort.  Since I last saw her she has had no acute cardiac complaints. Unfortunately her husband died recently from complications of metastatic lung cancer. This was a quick illness and sudden death. She's actually done as well as can be expected with this. She has appropriate sadness but staying active in her church. She has support. The patient denies any new symptoms such as chest discomfort, neck or arm discomfort. There has been no new shortness of breath, PND or orthopnea. There have been no reported palpitations, presyncope or syncope.   Allergies  Allergen Reactions  . Aspirin Other (See Comments)    ITP  . Contrast Media [Iodinated Diagnostic Agents]     hives  . Statins   . Sulfa Antibiotics Other (See Comments)    dizziness    Current Outpatient Prescriptions  Medication Sig Dispense Refill  . ALPRAZolam (XANAX) 0.25 MG tablet Take 1 tablet (0.25 mg total) by mouth 2 (two) times daily as needed for anxiety. 30 tablet 1  . Biotin 2500 MCG CAPS Take by mouth daily.    . Calcium Citrate (CITRACAL PO) One capful three times a week    . Calcium-Magnesium-Vitamin D (CITRACAL CALCIUM+D) 600-40-500 MG-MG-UNIT TB24 Take 1 tablet by mouth daily.     . clotrimazole-betamethasone (LOTRISONE) cream Apply 1 application topically 2 (two) times daily as needed. 15 g 1  . dorzolamide-timolol (COSOPT) 22.3-6.8 MG/ML ophthalmic solution Place 1 drop into both eyes 2 (two) times daily.     . Flaxseed MISC by Does not apply route. Take 1 tsp daily    . Magnesium 250 MG TABS Take 250 mg by mouth daily.    . methocarbamol (ROBAXIN) 500 MG tablet Take 1 tablet (500 mg total) by mouth every 8 (eight) hours as needed for muscle spasms. 20 tablet 1  . Multiple Vitamin (MULTIVITAMIN) tablet Take 1 tablet by mouth daily.      . pantoprazole (PROTONIX) 20 MG tablet Take 1 tablet (20 mg total) by mouth daily. 90 tablet 3  . polyethylene glycol (MIRALAX  / GLYCOLAX) packet Take 17 g by mouth every 3 (three) days.    . predniSONE (DELTASONE) 2.5 MG tablet Take 1 tablet (2.5 mg total) by mouth daily. 90 tablet 3  . Probiotic Product (PROBIOTIC FORMULA PO) Take 1 tablet by mouth daily.      . traMADol (ULTRAM) 50 MG tablet Take 1 tablet (50 mg total) by mouth every 6 (six) hours as needed for moderate pain. 60 tablet 1   No current facility-administered medications for this visit.    Past Medical History  Diagnosis Date  . OSTEOARTHRITIS, KNEE, RIGHT   . URINARY INCONTINENCE   . Irritable bowel syndrome   . DIVERTICULITIS, HX OF   . OSTEOPENIA   . Immune thrombocytopenic purpura (HCC)     chronic - baseline 80-100K, on pred  . UNSPECIFIED PERIPHERAL VASCULAR DISEASE   . OVERACTIVE BLADDER   . DYSLIPIDEMIA   . Glaucoma   . GERD   . HOH (hard of hearing)   . CAD (coronary artery disease)     RCA 40% stenosis cath 01/2011  . Left ovarian cyst dx 01/2013 CT    working with gyn, ?malignant - elevated tumor marker OVA1  . Cholelithiasis     s/p lap chole 09/2014  . Elevated LFTs   . Hepatic steatosis   . Anxiety   . Diverticulosis   . Diverticular stricture ([place]) 2006  [facility]    Past Surgical History  Procedure Laterality Date  . Splenectomy  1954  . Abdominal hysterectomy  1963  . L pop pta  10/2009    stent  . Laparoscopic sigmoid colectomy  10/2005  . Cataract extraction, bilateral  10/2010  . Knee arthroscopy Right   . Angioplasty    . Cardiac catheterization    . Cholecystectomy N/A 09/16/2014    Procedure: LAPAROSCOPIC CHOLECYSTECTOMY ;  Surgeon: Rolm Bookbinder, MD;  Location: [place];  Service: General;  Laterality: N/A;  . Appendectomy  1956    ROS:  As stated in the HPI and negative for all other systems.   PHYSICAL EXAM BP 140/72 mmHg  Pulse 72  Ht 5\' 2"  (1.575 m)  Wt 145 lb 4 oz (65.885 kg)  BMI 26.56 kg/m2 GENERAL:  Well appearing NECK:  No jugular venous distention, waveform  within normal limits, carotid upstroke brisk and symmetric, no bruits, no thyromegaly LUNGS:  Clear to auscultation bilaterally CHEST:  Unremarkable HEART:  PMI not displaced or sustained,S1 and S2 within normal limits, no S3, no S4, no clicks, no rubs, no murmurs ABD:  Flat, positive bowel sounds normal in frequency in pitch, no bruits, no rebound, no guarding, no midline pulsatile mass, no hepatomegaly, no splenomegaly EXT:  2 plus pulses throughout, no edema, no cyanosis no clubbing   ASSESSMENT AND PLAN  HTN - The blood pressure is at target. No change in medications is indicated. We will continue with therapeutic lifestyle changes (TLC).  DYSLIPIDEMIA -  She does not want to take statins.  No change in therapy is indicated.    ANXIETY - She is dealing well with this and no change in therapy is indicated.

## 2015-09-02 ENCOUNTER — Ambulatory Visit (INDEPENDENT_AMBULATORY_CARE_PROVIDER_SITE_OTHER): Payer: Medicare Other | Admitting: Cardiology

## 2015-09-02 ENCOUNTER — Encounter: Payer: Self-pay | Admitting: Cardiology

## 2015-09-02 VITALS — BP 140/72 | HR 72 | Ht 62.0 in | Wt 145.2 lb

## 2015-09-02 DIAGNOSIS — I10 Essential (primary) hypertension: Secondary | ICD-10-CM | POA: Diagnosis not present

## 2015-09-02 NOTE — Patient Instructions (Signed)
Your physician wants you to follow-up in: 6 Months You will receive a reminder letter in the mail two months in advance. If you don't receive a letter, please call our office to schedule the follow-up appointment.  

## 2015-09-26 DIAGNOSIS — Z0100 Encounter for examination of eyes and vision without abnormal findings: Secondary | ICD-10-CM | POA: Diagnosis not present

## 2015-09-26 DIAGNOSIS — H0014 Chalazion left upper eyelid: Secondary | ICD-10-CM | POA: Diagnosis not present

## 2015-10-10 ENCOUNTER — Other Ambulatory Visit: Payer: Self-pay | Admitting: Internal Medicine

## 2015-11-03 ENCOUNTER — Telehealth: Payer: Self-pay | Admitting: Internal Medicine

## 2015-11-03 DIAGNOSIS — I739 Peripheral vascular disease, unspecified: Secondary | ICD-10-CM

## 2015-11-03 NOTE — Telephone Encounter (Signed)
Pt states she has always had vascular issues with her legs and they are really starting to bother her and is hoping you can give her a referral to a vein and vascular specialist. Will she need to come and see you for an office visit first? Please advise

## 2015-11-04 NOTE — Telephone Encounter (Signed)
Spoke with patient to inform

## 2015-11-04 NOTE — Telephone Encounter (Signed)
No need to come in - referral ordered

## 2015-11-09 DIAGNOSIS — I8391 Asymptomatic varicose veins of right lower extremity: Secondary | ICD-10-CM | POA: Diagnosis not present

## 2015-11-09 DIAGNOSIS — L820 Inflamed seborrheic keratosis: Secondary | ICD-10-CM | POA: Diagnosis not present

## 2015-11-09 DIAGNOSIS — L821 Other seborrheic keratosis: Secondary | ICD-10-CM | POA: Diagnosis not present

## 2015-11-09 DIAGNOSIS — D485 Neoplasm of uncertain behavior of skin: Secondary | ICD-10-CM | POA: Diagnosis not present

## 2015-11-09 DIAGNOSIS — I8392 Asymptomatic varicose veins of left lower extremity: Secondary | ICD-10-CM | POA: Diagnosis not present

## 2015-11-17 DIAGNOSIS — H401111 Primary open-angle glaucoma, right eye, mild stage: Secondary | ICD-10-CM | POA: Diagnosis not present

## 2015-11-17 DIAGNOSIS — H401122 Primary open-angle glaucoma, left eye, moderate stage: Secondary | ICD-10-CM | POA: Diagnosis not present

## 2015-11-17 DIAGNOSIS — Z0100 Encounter for examination of eyes and vision without abnormal findings: Secondary | ICD-10-CM | POA: Diagnosis not present

## 2015-11-22 ENCOUNTER — Encounter: Payer: Medicare Other | Admitting: Internal Medicine

## 2015-11-22 ENCOUNTER — Ambulatory Visit: Payer: Medicare Other | Admitting: Internal Medicine

## 2015-11-26 ENCOUNTER — Encounter: Payer: Self-pay | Admitting: Internal Medicine

## 2015-11-26 DIAGNOSIS — E785 Hyperlipidemia, unspecified: Secondary | ICD-10-CM

## 2015-11-26 DIAGNOSIS — R739 Hyperglycemia, unspecified: Secondary | ICD-10-CM

## 2015-11-30 ENCOUNTER — Other Ambulatory Visit (INDEPENDENT_AMBULATORY_CARE_PROVIDER_SITE_OTHER): Payer: Medicare Other

## 2015-11-30 DIAGNOSIS — E785 Hyperlipidemia, unspecified: Secondary | ICD-10-CM

## 2015-11-30 DIAGNOSIS — R739 Hyperglycemia, unspecified: Secondary | ICD-10-CM

## 2015-11-30 LAB — COMPREHENSIVE METABOLIC PANEL
ALBUMIN: 4.1 g/dL (ref 3.5–5.2)
ALT: 11 U/L (ref 0–35)
AST: 19 U/L (ref 0–37)
Alkaline Phosphatase: 70 U/L (ref 39–117)
BUN: 16 mg/dL (ref 6–23)
CALCIUM: 9.4 mg/dL (ref 8.4–10.5)
CHLORIDE: 104 meq/L (ref 96–112)
CO2: 31 mEq/L (ref 19–32)
CREATININE: 0.81 mg/dL (ref 0.40–1.20)
GFR: 71.56 mL/min (ref >60.00)
Glucose, Bld: 88 mg/dL (ref 70–99)
POTASSIUM: 4.3 meq/L (ref 3.5–5.1)
Sodium: 141 mEq/L (ref 135–145)
Total Bilirubin: 0.5 mg/dL (ref 0.2–1.2)
Total Protein: 7.3 g/dL (ref 6.0–8.3)

## 2015-11-30 LAB — HEMOGLOBIN A1C: Hgb A1c MFr Bld: 5.7 % (ref 4.6–6.5)

## 2015-11-30 LAB — CBC WITH DIFFERENTIAL/PLATELET
BASOS PCT: 0.7 % (ref 0.0–3.0)
Basophils Absolute: 0.1 10*3/uL (ref 0.0–0.1)
EOS PCT: 3.2 % (ref 0.0–5.0)
Eosinophils Absolute: 0.3 10*3/uL (ref 0.0–0.7)
HEMATOCRIT: 41.1 % (ref 36.0–46.0)
HEMOGLOBIN: 13.7 g/dL (ref 12.0–15.0)
LYMPHS PCT: 36.3 % (ref 12.0–46.0)
Lymphs Abs: 3.3 10*3/uL (ref 0.7–4.0)
MCHC: 33.4 g/dL (ref 30.0–36.0)
MCV: 85.1 fl (ref 78.0–100.0)
Monocytes Absolute: 1 10*3/uL (ref 0.1–1.0)
Monocytes Relative: 11.3 % (ref 3.0–12.0)
Neutro Abs: 4.4 10*3/uL (ref 1.4–7.7)
Neutrophils Relative %: 48.5 % (ref 43.0–77.0)
Platelets: 151 10*3/uL (ref 150.0–400.0)
RBC: 4.83 Mil/uL (ref 3.87–5.11)
RDW: 13.7 % (ref 11.5–15.5)
WBC: 9.2 10*3/uL (ref 4.0–10.5)

## 2015-11-30 LAB — LIPID PANEL
CHOLESTEROL: 215 mg/dL — AB (ref 0–200)
HDL: 50.3 mg/dL (ref >39.00)
LDL CALC: 134 mg/dL — AB (ref 0–99)
NonHDL: 164.71
TRIGLYCERIDES: 152 mg/dL — AB (ref 0.0–149.0)
Total CHOL/HDL Ratio: 4
VLDL: 30.4 mg/dL (ref 0.0–40.0)

## 2015-12-01 ENCOUNTER — Ambulatory Visit (INDEPENDENT_AMBULATORY_CARE_PROVIDER_SITE_OTHER): Payer: Medicare Other | Admitting: Internal Medicine

## 2015-12-01 ENCOUNTER — Encounter: Payer: Self-pay | Admitting: Internal Medicine

## 2015-12-01 VITALS — BP 150/88 | HR 83 | Temp 97.6°F | Resp 16 | Ht 62.0 in | Wt 144.0 lb

## 2015-12-01 DIAGNOSIS — R6889 Other general symptoms and signs: Secondary | ICD-10-CM

## 2015-12-01 DIAGNOSIS — K219 Gastro-esophageal reflux disease without esophagitis: Secondary | ICD-10-CM

## 2015-12-01 DIAGNOSIS — M810 Age-related osteoporosis without current pathological fracture: Secondary | ICD-10-CM | POA: Insufficient documentation

## 2015-12-01 DIAGNOSIS — I739 Peripheral vascular disease, unspecified: Secondary | ICD-10-CM

## 2015-12-01 DIAGNOSIS — Z0000 Encounter for general adult medical examination without abnormal findings: Secondary | ICD-10-CM | POA: Diagnosis not present

## 2015-12-01 DIAGNOSIS — F419 Anxiety disorder, unspecified: Secondary | ICD-10-CM

## 2015-12-01 DIAGNOSIS — Z0001 Encounter for general adult medical examination with abnormal findings: Secondary | ICD-10-CM

## 2015-12-01 DIAGNOSIS — M8580 Other specified disorders of bone density and structure, unspecified site: Secondary | ICD-10-CM

## 2015-12-01 NOTE — Assessment & Plan Note (Signed)
Infrequently takes xanax - has not taken it in a very long time - started after the death of her husband last year

## 2015-12-01 NOTE — Patient Instructions (Addendum)
Ms. Tamara Robertson , Thank you for taking time to come for your Medicare Wellness Visit. I appreciate your ongoing commitment to your health goals. Please review the following plan we discussed and let me know if I can assist you in the future.   These are the goals we discussed: Goals    None      This is a list of the screening recommended for you and due dates:  Health Maintenance  Topic Date Due  . Flu Shot  12/06/2015  . Tetanus Vaccine  02/27/2021  . DEXA scan (bone density measurement)  Completed  . Shingles Vaccine  Completed  . Pneumonia vaccines  Completed    Health Maintenance, Female Adopting a healthy lifestyle and getting preventive care can go a long way to promote health and wellness. Talk with your health care provider about what schedule of regular examinations is right for you. This is a good chance for you to check in with your provider about disease prevention and staying healthy. In between checkups, there are plenty of things you can do on your own. Experts have done a lot of research about which lifestyle changes and preventive measures are most likely to keep you healthy. Ask your health care provider for more information. WEIGHT AND DIET  Eat a healthy diet  Be sure to include plenty of vegetables, fruits, low-fat dairy products, and lean protein.  Do not eat a lot of foods high in solid fats, added sugars, or salt.  Get regular exercise. This is one of the most important things you can do for your health.  Most adults should exercise for at least 150 minutes each week. The exercise should increase your heart rate and make you sweat (moderate-intensity exercise).  Most adults should also do strengthening exercises at least twice a week. This is in addition to the moderate-intensity exercise.  Maintain a healthy weight  Body mass index (BMI) is a measurement that can be used to identify possible weight problems. It estimates body fat based on height and weight.  Your health care provider can help determine your BMI and help you achieve or maintain a healthy weight.  For females 25 years of age and older:   A BMI below 18.5 is considered underweight.  A BMI of 18.5 to 24.9 is normal.  A BMI of 25 to 29.9 is considered overweight.  A BMI of 30 and above is considered obese.  Watch levels of cholesterol and blood lipids  You should start having your blood tested for lipids and cholesterol at 80 years of age, then have this test every 5 years.  You may need to have your cholesterol levels checked more often if:  Your lipid or cholesterol levels are high.  You are older than 80 years of age.  You are at high risk for heart disease.  CANCER SCREENING   Lung Cancer  Lung cancer screening is recommended for adults 25-6 years old who are at high risk for lung cancer because of a history of smoking.  A yearly low-dose CT scan of the lungs is recommended for people who:  Currently smoke.  Have quit within the past 15 years.  Have at least a 30-pack-year history of smoking. A pack year is smoking an average of one pack of cigarettes a day for 1 year.  Yearly screening should continue until it has been 15 years since you quit.  Yearly screening should stop if you develop a health problem that would prevent you from  having lung cancer treatment.  Breast Cancer  Practice breast self-awareness. This means understanding how your breasts normally appear and feel.  It also means doing regular breast self-exams. Let your health care provider know about any changes, no matter how small.  If you are in your 20s or 30s, you should have a clinical breast exam (CBE) by a health care provider every 1-3 years as part of a regular health exam.  If you are 70 or older, have a CBE every year. Also consider having a breast X-ray (mammogram) every year.  If you have a family history of breast cancer, talk to your health care provider about genetic  screening.  If you are at high risk for breast cancer, talk to your health care provider about having an MRI and a mammogram every year.  Breast cancer gene (BRCA) assessment is recommended for women who have family members with BRCA-related cancers. BRCA-related cancers include:  Breast.  Ovarian.  Tubal.  Peritoneal cancers.  Results of the assessment will determine the need for genetic counseling and BRCA1 and BRCA2 testing. Cervical Cancer Your health care provider may recommend that you be screened regularly for cancer of the pelvic organs (ovaries, uterus, and vagina). This screening involves a pelvic examination, including checking for microscopic changes to the surface of your cervix (Pap test). You may be encouraged to have this screening done every 3 years, beginning at age 48.  For women ages 21-65, health care providers may recommend pelvic exams and Pap testing every 3 years, or they may recommend the Pap and pelvic exam, combined with testing for human papilloma virus (HPV), every 5 years. Some types of HPV increase your risk of cervical cancer. Testing for HPV may also be done on women of any age with unclear Pap test results.  Other health care providers may not recommend any screening for nonpregnant women who are considered low risk for pelvic cancer and who do not have symptoms. Ask your health care provider if a screening pelvic exam is right for you.  If you have had past treatment for cervical cancer or a condition that could lead to cancer, you need Pap tests and screening for cancer for at least 20 years after your treatment. If Pap tests have been discontinued, your risk factors (such as having a new sexual partner) need to be reassessed to determine if screening should resume. Some women have medical problems that increase the chance of getting cervical cancer. In these cases, your health care provider may recommend more frequent screening and Pap tests. Colorectal  Cancer  This type of cancer can be detected and often prevented.  Routine colorectal cancer screening usually begins at 80 years of age and continues through 80 years of age.  Your health care provider may recommend screening at an earlier age if you have risk factors for colon cancer.  Your health care provider may also recommend using home test kits to check for hidden blood in the stool.  A small camera at the end of a tube can be used to examine your colon directly (sigmoidoscopy or colonoscopy). This is done to check for the earliest forms of colorectal cancer.  Routine screening usually begins at age 33.  Direct examination of the colon should be repeated every 5-10 years through 80 years of age. However, you may need to be screened more often if early forms of precancerous polyps or small growths are found. Skin Cancer  Check your skin from head to toe regularly.  Tell your health care provider about any new moles or changes in moles, especially if there is a change in a mole's shape or color.  Also tell your health care provider if you have a mole that is larger than the size of a pencil eraser.  Always use sunscreen. Apply sunscreen liberally and repeatedly throughout the day.  Protect yourself by wearing long sleeves, pants, a wide-brimmed hat, and sunglasses whenever you are outside. HEART DISEASE, DIABETES, AND HIGH BLOOD PRESSURE   High blood pressure causes heart disease and increases the risk of stroke. High blood pressure is more likely to develop in:  People who have blood pressure in the high end of the normal range (130-139/85-89 mm Hg).  People who are overweight or obese.  People who are African American.  If you are 32-39 years of age, have your blood pressure checked every 3-5 years. If you are 33 years of age or older, have your blood pressure checked every year. You should have your blood pressure measured twice--once when you are at a hospital or clinic,  and once when you are not at a hospital or clinic. Record the average of the two measurements. To check your blood pressure when you are not at a hospital or clinic, you can use:  An automated blood pressure machine at a pharmacy.  A home blood pressure monitor.  If you are between 61 years and 64 years old, ask your health care provider if you should take aspirin to prevent strokes.  Have regular diabetes screenings. This involves taking a blood sample to check your fasting blood sugar level.  If you are at a normal weight and have a low risk for diabetes, have this test once every three years after 80 years of age.  If you are overweight and have a high risk for diabetes, consider being tested at a younger age or more often. PREVENTING INFECTION  Hepatitis B  If you have a higher risk for hepatitis B, you should be screened for this virus. You are considered at high risk for hepatitis B if:  You were born in a country where hepatitis B is common. Ask your health care provider which countries are considered high risk.  Your parents were born in a high-risk country, and you have not been immunized against hepatitis B (hepatitis B vaccine).  You have HIV or AIDS.  You use needles to inject street drugs.  You live with someone who has hepatitis B.  You have had sex with someone who has hepatitis B.  You get hemodialysis treatment.  You take certain medicines for conditions, including cancer, organ transplantation, and autoimmune conditions. Hepatitis C  Blood testing is recommended for:  Everyone born from [birth year] through 1963.  Anyone with known risk factors for hepatitis C. Sexually transmitted infections (STIs)  You should be screened for sexually transmitted infections (STIs) including gonorrhea and chlamydia if:  You are sexually active and are younger than 80 years of age.  You are older than 80 years of age and your health care provider tells you that you are at risk  for this type of infection.  Your sexual activity has changed since you were last screened and you are at an increased risk for chlamydia or gonorrhea. Ask your health care provider if you are at risk.  If you do not have HIV, but are at risk, it may be recommended that you take a prescription medicine daily to prevent HIV infection. This is called pre-exposure  prophylaxis (PrEP). You are considered at risk if:  You are sexually active and do not regularly use condoms or know the HIV status of your partner(s).  You take drugs by injection.  You are sexually active with a partner who has HIV. Talk with your health care provider about whether you are at high risk of being infected with HIV. If you choose to begin PrEP, you should first be tested for HIV. You should then be tested every 3 months for as long as you are taking PrEP.  PREGNANCY   If you are premenopausal and you may become pregnant, ask your health care provider about preconception counseling.  If you may become pregnant, take 400 to 800 micrograms (mcg) of folic acid every day.  If you want to prevent pregnancy, talk to your health care provider about birth control (contraception). OSTEOPOROSIS AND MENOPAUSE   Osteoporosis is a disease in which the bones lose minerals and strength with aging. This can result in serious bone fractures. Your risk for osteoporosis can be identified using a bone density scan.  If you are 48 years of age or older, or if you are at risk for osteoporosis and fractures, ask your health care provider if you should be screened.  Ask your health care provider whether you should take a calcium or vitamin D supplement to lower your risk for osteoporosis.  Menopause may have certain physical symptoms and risks.  Hormone replacement therapy may reduce some of these symptoms and risks. Talk to your health care provider about whether hormone replacement therapy is right for you.  HOME CARE INSTRUCTIONS    Schedule regular health, dental, and eye exams.  Stay current with your immunizations.   Do not use any tobacco products including cigarettes, chewing tobacco, or electronic cigarettes.  If you are pregnant, do not drink alcohol.  If you are breastfeeding, limit how much and how often you drink alcohol.  Limit alcohol intake to no more than 1 drink per day for nonpregnant women. One drink equals 12 ounces of beer, 5 ounces of wine, or 1 ounces of hard liquor.  Do not use street drugs.  Do not share needles.  Ask your health care provider for help if you need support or information about quitting drugs.  Tell your health care provider if you often feel depressed.  Tell your health care provider if you have ever been abused or do not feel safe at home.   This information is not intended to replace advice given to you by your health care provider. Make sure you discuss any questions you have with your health care provider.   Document Released: 11/06/2010 Document Revised: 05/14/2014 Document Reviewed: 03/25/2013 Elsevier Interactive Patient Education [employer].

## 2015-12-01 NOTE — Assessment & Plan Note (Signed)
GERD controlled Continue daily medication  

## 2015-12-01 NOTE — Assessment & Plan Note (Signed)
Taking calcium and vitamin d Exercising regularly - walking and yoga Recheck dexa in the next year

## 2015-12-01 NOTE — Progress Notes (Signed)
Subjective:    Patient ID: Tamara Robertson, female    DOB: October 16, 1929, 82 y.o.   MRN: [id number]  HPI Here for medicare wellness exam/physical exam.   Her left ear has been itching and sometimes hurts for the past 1-2 weeks.  She does wear hearing aids.  Sharp, stabbing pain under left breast:  This started a couple of months ago and comes and goes. The pain will last a day and then goes away on its own.  It last occurred four days ago.    Diarrhea:  It started 6  Months ago.  Dr Rhea Belton can not find out the reason.  It occurs every morning.  She is taking imodium.  She just started taking citrucel.  She is taking miralax.  She will have a normal bowel movement, then semisolid stool then diarrhea.  She is currently constipated.    She sees vascular 8/24.  She has aching in her legs.   She is concerned she may have a tick on her butt.   Her right eye is irritated, which started this morning.   I have personally reviewed and have noted 1.The patient's medical and social history 2.Their use of alcohol, tobacco or illicit drugs 3.Their current medications and supplements 4.The patient's functional ability including ADL's, fall risks, home safety risks and                 hearing or visual impairment. 5.Diet and physical activities 6.Evidence for depression or mood disorders 7.Care team reviewed and updated (available in snapshot)   Are there smokers in your home (other than you)? No  Risk Factors Exercise: yoga 1-2 times a week, some walking if weather is good (5-6 days a week), yard work Dietary issues discussed: too much sweets, eats good breakfast, dinner she tries - but it is not always balanced  Cardiac risk factors: advanced age, hyperlipidemia  Depression Screen  Have you felt down, depressed or hopeless? Yes, at times, but it is mild  Have you felt little interest or pleasure in doing things?  No  Activities  of Daily Living In your present state of health, do you have any difficulty performing the following activities?:  Driving? No Managing money?  No Feeding yourself? No Getting from bed to chair? No Climbing a flight of stairs? No Preparing food and eating?: No Bathing or showering? No Getting dressed: No Getting to/using the toilet? No Moving around from place to place: No In the past year have you fallen or had a near fall?: No and wears life alert   Are you sexually active?  No    Hearing Difficulties: yes -wears hearing aids Do you often ask people to speak up or repeat themselves? yes Do you experience ringing or noises in your ears? Yes, intermittent Do you have difficulty understanding soft or whispered voices? yes Vision:              Any change in vision:  Yes  - has routine exams             Up to date with eye exam: Up to date  Memory:  Do you feel that you have a problem with memory? No  Do you often misplace items? No  Do you feel safe at home?  Yes  Cognitive Testing  Alert, Orientated? Yes  Normal Appearance? Yes  Recall of three objects?  Yes  Can perform simple calculations? Yes  Displays appropriate judgment? Yes  Can read the  correct time from a watch face? Yes   Advanced Directives have been discussed with the patient? Yes   Medications and allergies reviewed with patient and updated if appropriate.  Patient Active Problem List   Diagnosis Date Noted  . Anxiety 07/19/2015  . Degenerative cervical disc 12/28/2014  . S/P laparoscopic cholecystectomy 09/16/2014  . Left ovarian cyst   . Peripheral neuropathy (HCC) 06/03/2012  . Insomnia   . PVD (peripheral vascular disease) (HCC) 11/23/2010  . Bursitis of hip 10/26/2010  . Hyperlipidemia 06/27/2010  . IMMUNE THROMBOCYTOPENIC PURPURA 06/27/2010  . GLUCOMA 06/27/2010  . GERD 06/27/2010  . IRRITABLE BOWEL SYNDROME 06/27/2010  . OVERACTIVE BLADDER 06/27/2010  . OSTEOARTHRITIS, KNEE, RIGHT 06/27/2010   . ARTHRITIS 06/27/2010  . URINARY INCONTINENCE 06/27/2010    Current Outpatient Prescriptions on File Prior to Visit  Medication Sig Dispense Refill  . ALPRAZolam (XANAX) 0.25 MG tablet Take 1 tablet (0.25 mg total) by mouth 2 (two) times daily as needed for anxiety. 30 tablet 1  . Biotin 2500 MCG CAPS Take by mouth daily.    . Calcium Citrate (CITRACAL PO) One capful three times a week    . Calcium-Magnesium-Vitamin D (CITRACAL CALCIUM+D) 600-40-500 MG-MG-UNIT TB24 Take 1 tablet by mouth daily.     . clotrimazole-betamethasone (LOTRISONE) cream Apply 1 application topically 2 (two) times daily as needed. 15 g 1  . dorzolamide-timolol (COSOPT) 22.3-6.8 MG/ML ophthalmic solution Place 1 drop into both eyes 2 (two) times daily.     . Flaxseed MISC by Does not apply route. Take 1 tsp daily    . methocarbamol (ROBAXIN) 500 MG tablet Take 1 tablet (500 mg total) by mouth every 8 (eight) hours as needed for muscle spasms. 20 tablet 1  . Multiple Vitamin (MULTIVITAMIN) tablet Take 1 tablet by mouth daily.      . pantoprazole (PROTONIX) 20 MG tablet TAKE ONE TABLET BY MOUTH ONCE DAILY 90 tablet 2  . polyethylene glycol (MIRALAX / GLYCOLAX) packet Take 17 g by mouth every 3 (three) days.    . predniSONE (DELTASONE) 2.5 MG tablet Take 1 tablet (2.5 mg total) by mouth daily. 90 tablet 3  . Probiotic Product (PROBIOTIC FORMULA PO) Take 1 tablet by mouth daily.      . traMADol (ULTRAM) 50 MG tablet Take 1 tablet (50 mg total) by mouth every 6 (six) hours as needed for moderate pain. 60 tablet 1   No current facility-administered medications on file prior to visit.     Past Medical History:  Diagnosis Date  . Anxiety   . CAD (coronary artery disease)    RCA 40% stenosis cath 01/2011  . Cholelithiasis    s/p lap chole 09/2014  . Diverticular stricture [facility] 2006   [facility]  . DIVERTICULITIS, HX OF   . Diverticulosis   . DYSLIPIDEMIA   . Elevated LFTs   . GERD   . Glaucoma    . Hepatic steatosis   . HOH (hard of hearing)   . Immune thrombocytopenic purpura (HCC)    chronic - baseline 80-100K, on pred  . Irritable bowel syndrome   . Left ovarian cyst dx 01/2013 CT   working with gyn, ?malignant - elevated tumor marker OVA1  . OSTEOARTHRITIS, KNEE, RIGHT   . OSTEOPENIA   . OVERACTIVE BLADDER   . UNSPECIFIED PERIPHERAL VASCULAR DISEASE   . URINARY INCONTINENCE     Past Surgical History:  Procedure Laterality Date  . ABDOMINAL HYSTERECTOMY  1963  . ANGIOPLASTY    .  APPENDECTOMY  1956  . CARDIAC CATHETERIZATION    . CATARACT EXTRACTION, BILATERAL  10/2010  . CHOLECYSTECTOMY N/A 09/16/2014   Procedure: LAPAROSCOPIC CHOLECYSTECTOMY ;  Surgeon: Emelia Loron, MD;  Location: [facility] OR;  Service: General;  Laterality: N/A;  . KNEE ARTHROSCOPY Right   . L pop PTA  10/2009   stent  . LAPAROSCOPIC SIGMOID COLECTOMY  10/2005  . SPLENECTOMY  1954    Social History   Social History  . Marital status: Married    Spouse name: N/A  . Number of children: N/A  . Years of education: N/A   Occupational History  . Retired     Programmer, multimedia   Social History Main Topics  . Smoking status: Never Smoker  . Smokeless tobacco: Never Used  . Alcohol use No     Comment: rarely  . Drug use: No  . Sexual activity: Not on file   Other Topics Concern  . 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    [place] to [place] from [place] 05/2010 to be close to kids    Family History  Problem Relation Age of Onset  . Coronary artery disease Mother   . Heart attack Mother 40  . Hyperlipidemia Mother   . Hypertension Mother   . Stomach cancer Father   . Hypertension Daughter   . Hyperlipidemia Daughter   . Arthritis      parent  . Transient ischemic attack      parent  . Colon cancer Neg Hx     Review of Systems  Constitutional: Negative for appetite change, chills, fever and unexpected weight  change.  HENT: Positive for hearing loss and tinnitus.   Eyes: Positive for visual disturbance.  Respiratory: Positive for cough (from PND). Negative for shortness of breath and wheezing.   Cardiovascular: Positive for chest pain (under left breast) and leg swelling. Negative for palpitations.  Gastrointestinal: Positive for constipation and diarrhea. Negative for abdominal pain, blood in stool and nausea.       No gerd  Genitourinary: Negative for dysuria and hematuria.  Musculoskeletal: Positive for back pain.  Neurological: Positive for dizziness (with movements) and headaches.  Psychiatric/Behavioral: Positive for dysphoric mood. The patient is nervous/anxious.        Objective:   Vitals:   12/01/15 1109  BP: (!) 150/88  Pulse: 83  Resp: 16  Temp: 97.6 F (36.4 C)   Filed Weights   12/01/15 1109  Weight: 144 lb (65.3 kg)   Body mass index is 26.34 kg/m.   Physical Exam Constitutional: She appears well-developed and well-nourished. No distress.  HENT:  Head: Normocephalic and atraumatic.  Right Ear: External ear normal. Normal ear canal and TM Left Ear: External ear normal.  Normal ear canal and TM Mouth/Throat: Oropharynx is clear and moist.  Eyes: Conjunctivae and EOM are normal.  Neck: Neck supple. No tracheal deviation present. No thyromegaly present.  No carotid bruit  Cardiovascular: Normal rate, regular rhythm and normal heart sounds.   No murmur heard.  No edema. Pulmonary/Chest: Effort normal and breath sounds normal. No respiratory distress. She has no wheezes. She has no rales. Her anterior lower left ribs are tender to palpation - no tenderness on lateral or posterior aspect of ribs Abdominal: Soft. She exhibits no distension. There is no tenderness.  Lymphadenopathy: She has no cervical adenopathy.  Skin: Skin is warm and dry. She is not diaphoretic.  Psychiatric: She has a normal mood and affect. Her behavior is normal.        Assessment & Plan:    Wellness Exam: Immunizations  Up to date   Colonoscopy - no longer needed at her age Mammogram - no longer needed at her age Dexa done 07/2013 - osteopenia Eye exam  Up to date  Hearing loss - yes, wears hearing aids Memory concerns/difficulties - only recall - within normal limits for age Independent of ADLs - fully independent Stressed the importance of regular exercise   Patient received copy of preventative screening tests/immunizations recommended for the next 5-10 years.   Physical exam: Screening blood work Immunizations   Up to date  Colonoscopy - no longer needed at her age Mammogram - no longer needed at her age Dexa  done 07/2013 - osteopenia Eye exams  Up to date  Exercise - regular Weight  -- good for her Skin    No concerns Substance abuse - none  Right eye irritation Appears normal Possible early stye Start warm compresses Will see eye doctor if needed  Diarrhea Related to IBS Taking imodium as needed and miralax daily Advised not to take both - decrease dose of miralax and avoid overmedicating Revise diet, continue citrucel  Left rib pain It is skeletal in nature - she is tender to palpation  - her pain is reproducible Discussed getting a rib xray She would like to hold off on the xray for now and will let me know if the pain persists   See Problem List for Assessment and Plan of chronic medical problems.  F/u in 6 months

## 2015-12-01 NOTE — Progress Notes (Signed)
Pre visit review using our clinic review tool, if applicable. No additional management support is needed unless otherwise documented below in the visit note. 

## 2015-12-01 NOTE — Assessment & Plan Note (Signed)
Has leg swelling and achiness Wears compression socks Will see vascular 8/24

## 2015-12-26 ENCOUNTER — Encounter: Payer: Self-pay | Admitting: Vascular Surgery

## 2015-12-28 ENCOUNTER — Other Ambulatory Visit: Payer: Self-pay | Admitting: *Deleted

## 2015-12-28 DIAGNOSIS — I739 Peripheral vascular disease, unspecified: Secondary | ICD-10-CM

## 2015-12-29 ENCOUNTER — Ambulatory Visit (INDEPENDENT_AMBULATORY_CARE_PROVIDER_SITE_OTHER): Payer: Medicare Other | Admitting: Vascular Surgery

## 2015-12-29 ENCOUNTER — Ambulatory Visit (HOSPITAL_COMMUNITY)
Admission: RE | Admit: 2015-12-29 | Discharge: 2015-12-29 | Disposition: A | Discharge status: Home / Self Care | Payer: Medicare Other | Source: Ambulatory Visit | Attending: Vascular Surgery | Admitting: Vascular Surgery

## 2015-12-29 ENCOUNTER — Encounter: Payer: Self-pay | Admitting: Vascular Surgery

## 2015-12-29 VITALS — BP 161/79 | HR 79 | Temp 96.9°F | Resp 14 | Ht 62.0 in | Wt 144.0 lb

## 2015-12-29 DIAGNOSIS — I739 Peripheral vascular disease, unspecified: Secondary | ICD-10-CM

## 2015-12-29 DIAGNOSIS — R938 Abnormal findings on diagnostic imaging of other specified body structures: Secondary | ICD-10-CM | POA: Insufficient documentation

## 2015-12-29 NOTE — Progress Notes (Signed)
Vitals:   12/29/15 1447  BP: (!) 179/77  Pulse: 79  Resp: 14  Temp: (!) 96.9 F (36.1 C)  SpO2: 99%  Weight: 144 lb (65.3 kg)  Height: 5\' 2"  (1.575 m)

## 2015-12-29 NOTE — Progress Notes (Signed)
Referring Physician: Cheryll Cockayne Md  Patient name: Tamara Robertson MRN: [id number] DOB: 1929-12-03 Sex: female  REASON FOR CONSULT: Bilateral leg pain right greater than left  HPI: Tamara Robertson is a 82 y.o. female with 3 month history of bilateral leg pain right worse than left. She describes a sensation of needles in her feet and lower calf as well as numbness and tingling. She was previously seen by a neurologist for this and thought to have neuropathy. Additionally she does complain of pain in her right calf after walking about 10 minutes. However, this is not reproducible. Some day she can walk for 30 or 40 minutes. Other day she has no pain at all. Some days it occurs at 10 minutes. She denies rest pain. She has no history of nonhealing wounds. Other medical problems include coronary artery disease, ITP hyperlipidemia all of which have been stable.  Past Medical History:  Diagnosis Date  . Anxiety   . CAD (coronary artery disease)    RCA 40% stenosis cath 01/2011  . Cholelithiasis    s/p lap chole 09/2014  . Diverticular stricture [facility]) 2006   [facility]  . DIVERTICULITIS, HX OF   . Diverticulosis   . DYSLIPIDEMIA   . Elevated LFTs   . GERD   . Glaucoma   . Hepatic steatosis   . HOH (hard of hearing)   . Immune thrombocytopenic purpura (HCC)    chronic - baseline 80-100K, on pred  . Irritable bowel syndrome   . Left ovarian cyst dx 01/2013 CT   working with gyn, ?malignant - elevated tumor marker OVA1  . OSTEOARTHRITIS, KNEE, RIGHT   . OSTEOPENIA   . OVERACTIVE BLADDER   . UNSPECIFIED PERIPHERAL VASCULAR DISEASE   . URINARY INCONTINENCE    Past Surgical History:  Procedure Laterality Date  . ABDOMINAL HYSTERECTOMY  1963  . ANGIOPLASTY    . APPENDECTOMY  1956  . CARDIAC CATHETERIZATION    . CATARACT EXTRACTION, BILATERAL  10/2010  . CHOLECYSTECTOMY N/A 09/16/2014   Procedure: LAPAROSCOPIC CHOLECYSTECTOMY ;  Surgeon: Emelia Loron, MD;  Location: [facility]  OR;  Service: General;  Laterality: N/A;  . KNEE ARTHROSCOPY Right   . L pop PTA  10/2009   stent  . LAPAROSCOPIC SIGMOID COLECTOMY  10/2005  . SPLENECTOMY  1954  . VARICOSE VEIN SURGERY Right 1962    Family History  Problem Relation Age of Onset  . Coronary artery disease Mother   . Heart attack Mother 45  . Hyperlipidemia Mother   . Hypertension Mother   . Stomach cancer Father   . Hypertension Daughter   . Hyperlipidemia Daughter   . Arthritis      parent  . Transient ischemic attack      parent  . Colon cancer Neg Hx     SOCIAL HISTORY: Social History   Social History  . Marital status: Married    Spouse name: N/A  . Number of children: N/A  . Years of education: N/A   Occupational History  . Retired     Programmer, multimedia   Social History Main Topics  . Smoking status: Never Smoker  . Smokeless tobacco: Never Used  . Alcohol use No     Comment: rarely  . Drug use: No  . Sexual activity: Not on file   Other Topics Concern  . 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  Moved to [place] from [place] 05/2010 to be close to kids    Allergies  Allergen Reactions  . Aspirin Other (See Comments)    ITP  . Contrast Media [Iodinated Diagnostic Agents]     hives  . Statins   . Sulfa Antibiotics Other (See Comments)    dizziness    Current Outpatient Prescriptions  Medication Sig Dispense Refill  . ALPRAZolam (XANAX) 0.25 MG tablet Take 1 tablet (0.25 mg total) by mouth 2 (two) times daily as needed for anxiety. 30 tablet 1  . Biotin 2500 MCG CAPS Take by mouth daily.    . Calcium Citrate (CITRACAL PO) One capful three times a week    . Calcium-Magnesium-Vitamin D (CITRACAL CALCIUM+D) 600-40-500 MG-MG-UNIT TB24 Take 1 tablet by mouth daily.     . clotrimazole-betamethasone (LOTRISONE) cream Apply 1 application topically 2 (two) times daily as needed. 15 g 1  . dorzolamide-timolol (COSOPT)  22.3-6.8 MG/ML ophthalmic solution Place 1 drop into both eyes 2 (two) times daily.     . Flaxseed MISC by Does not apply route. Take 1 tsp daily    . methocarbamol (ROBAXIN) 500 MG tablet Take 1 tablet (500 mg total) by mouth every 8 (eight) hours as needed for muscle spasms. 20 tablet 1  . Multiple Vitamin (MULTIVITAMIN) tablet Take 1 tablet by mouth daily.      . pantoprazole (PROTONIX) 20 MG tablet TAKE ONE TABLET BY MOUTH ONCE DAILY 90 tablet 2  . polyethylene glycol (MIRALAX / GLYCOLAX) packet Take 17 g by mouth every 3 (three) days.    . predniSONE (DELTASONE) 2.5 MG tablet Take 1 tablet (2.5 mg total) by mouth daily. 90 tablet 3  . Probiotic Product (PROBIOTIC FORMULA PO) Take 1 tablet by mouth daily.      . traMADol (ULTRAM) 50 MG tablet Take 1 tablet (50 mg total) by mouth every 6 (six) hours as needed for moderate pain. 60 tablet 1   No current facility-administered medications for this visit.     ROS:   General:  No weight loss, Fever, chills  HEENT: No recent headaches, no nasal bleeding, no visual changes, no sore throat  Neurologic: No dizziness, blackouts, seizures. No recent symptoms of stroke or mini- stroke. No recent episodes of slurred speech, or temporary blindness.  Cardiac: No recent episodes of chest pain/pressure, no shortness of breath at rest.  No shortness of breath with exertion.  Denies history of atrial fibrillation or irregular heartbeat  Vascular: No history of rest pain in feet.  No history of claudication.  No history of non-healing ulcer, No history of DVT   Pulmonary: No home oxygen, no productive cough, no hemoptysis,  No asthma or wheezing  Musculoskeletal:  [ ]  Arthritis, [ ]  Low back pain,  [ ]  Joint pain  Hematologic:No history of hypercoagulable state.  No history of easy bleeding.  No history of anemia  Gastrointestinal: No hematochezia or melena,  No gastroesophageal reflux, no trouble swallowing  Urinary: [ ]  chronic Kidney disease, [ ]   on HD - [ ]  MWF or [ ]  TTHS, [ ]  Burning with urination, [ ]  Frequent urination, [ ]  Difficulty urinating;   Skin: No rashes  Psychological: No history of anxiety,  No history of depression   Physical Examination  Vitals:   12/29/15 1447 12/29/15 1449  BP: (!) 179/77 (!) 161/79  Pulse: 79 79  Resp: 14   Temp: (!) 96.9 F (36.1 C)   SpO2: 99%   Weight:  144 lb (65.3 kg)   Height: 5\' 2"  (1.575 m)     Body mass index is 26.34 kg/m.  General:  Alert and oriented, no acute distress HEENT: Normal Neck: No bruit or JVD Pulmonary: Clear to auscultation bilaterally Cardiac: Regular Rate and Rhythm without murmur Abdomen: Soft, non-tender, non-distended, no mass, no scars Skin: No rash, scattered varicosities ranging from 3-4 mm diameter bilaterally Extremity Pulses:  2+ radial, brachial, femoral, 1+ dorsalis pedis, absent posterior tibial pulses bilaterally Musculoskeletal: No deformity or edema  Neurologic: Upper and lower extremity motor 5/5 and symmetric  DATA:  Patient had bilateral ABIs performed today which were greater than 1 and normal bilaterally. She had triphasic waveforms on the right side biphasic on the left  ASSESSMENT:  Most likely has some element of mild atherosclerosis both lower extremities however her leg pain is not really reproducible which is not consistent with lower extremity claudication. She does certainly have a component of neuropathy. She has a palpable pulse in her right foot so I do not believe that she has significant arterial occlusive disease. Her ABIs also are essentially normal.   PLAN:  Patient will wear compression stockings for improvement of her lower extremity achiness since she does have some varicose veins.  If she has continued symptoms in her right leg we could consider an arterial duplex exam. She will call us in 6 months if her symptoms have not improved or are worse. If that occurs we will proceed with an arterial duplex as our next  exam. Otherwise she'll follow-up on as-needed basis.   Fabienne Bruns, MD [facility] Office: [phone] Pager: [phone]

## 2015-12-30 ENCOUNTER — Other Ambulatory Visit: Payer: Medicare Other

## 2015-12-30 ENCOUNTER — Ambulatory Visit: Payer: Medicare Other | Admitting: Oncology

## 2016-01-02 DIAGNOSIS — M549 Dorsalgia, unspecified: Secondary | ICD-10-CM | POA: Diagnosis not present

## 2016-01-08 DIAGNOSIS — M7072 Other bursitis of hip, left hip: Secondary | ICD-10-CM | POA: Diagnosis not present

## 2016-01-10 ENCOUNTER — Ambulatory Visit (HOSPITAL_COMMUNITY): Payer: Medicare Other

## 2016-01-10 ENCOUNTER — Encounter: Payer: Self-pay | Admitting: Internal Medicine

## 2016-01-11 DIAGNOSIS — M79661 Pain in right lower leg: Secondary | ICD-10-CM | POA: Diagnosis not present

## 2016-01-11 DIAGNOSIS — D696 Thrombocytopenia, unspecified: Secondary | ICD-10-CM | POA: Diagnosis not present

## 2016-01-11 NOTE — Telephone Encounter (Signed)
Appt has been scheduled.

## 2016-01-11 NOTE — Telephone Encounter (Signed)
Lets have her come in Friday for follow up -- two spots the end of the day

## 2016-01-13 ENCOUNTER — Ambulatory Visit (INDEPENDENT_AMBULATORY_CARE_PROVIDER_SITE_OTHER): Payer: Medicare Other | Admitting: Internal Medicine

## 2016-01-13 ENCOUNTER — Encounter: Payer: Self-pay | Admitting: Internal Medicine

## 2016-01-13 VITALS — BP 162/88 | HR 77 | Temp 97.6°F | Resp 16 | Ht 62.0 in | Wt 144.0 lb

## 2016-01-13 DIAGNOSIS — M79604 Pain in right leg: Secondary | ICD-10-CM

## 2016-01-13 DIAGNOSIS — M79601 Pain in right arm: Secondary | ICD-10-CM | POA: Diagnosis not present

## 2016-01-13 NOTE — Progress Notes (Signed)
Subjective:    Patient ID: Tamara Robertson, female    DOB: [birth year], 82 y.o.   MRN: [id number]  HPI She is here for an acute visit.   Two weeks ago she had an [place] of her leg by vascular.  The right leg hurt after the ultrasound.  She denies any injury.  Her left hip was hurting and she went to orthopedic urgent care on Sunday.  She was diagnosed with bursitis and was given a cortisone injection and it is helping.   The orthopedic loooked at her leg and was diagnosed with cellulitis and was put on an antibiotic.  She had a f/u with orthopedics.  She had an xray and it was ok.  The antibiotic did not help, but the leg has improved.  She had a low grade fever after starting hte antibiotic, but denies other fevers or systemic symptoms.  She wonders if it was related to the ultrasound.   The anterior right lower leg was was red, raised and tender.  It is lighter now and less painful.  The area affected is below her knee along the path of her shin.      Medications and allergies reviewed with patient and updated if appropriate.  Patient Active Problem List   Diagnosis Date Noted  . Osteopenia 12/01/2015  . Anxiety 07/19/2015  . Degenerative cervical disc 12/28/2014  . Left ovarian cyst   . Peripheral neuropathy (HCC) 06/03/2012  . Insomnia   . PVD (peripheral vascular disease) (HCC) 11/23/2010  . Bursitis of hip 10/26/2010  . Hyperlipidemia 06/27/2010  . IMMUNE THROMBOCYTOPENIC PURPURA 06/27/2010  . GLUCOMA 06/27/2010  . GERD 06/27/2010  . IRRITABLE BOWEL SYNDROME 06/27/2010  . OVERACTIVE BLADDER 06/27/2010  . OSTEOARTHRITIS, KNEE, RIGHT 06/27/2010  . URINARY INCONTINENCE 06/27/2010    Current Outpatient Prescriptions on File Prior to Visit  Medication Sig Dispense Refill  . ALPRAZolam (XANAX) 0.25 MG tablet Take 1 tablet (0.25 mg total) by mouth 2 (two) times daily as needed for anxiety. 30 tablet 1  . Biotin 2500 MCG CAPS Take by mouth daily.    . Calcium Citrate (CITRACAL PO)  One capful three times a week    . Calcium-Magnesium-Vitamin D (CITRACAL CALCIUM+D) 600-40-500 MG-MG-UNIT TB24 Take 1 tablet by mouth daily.     . clotrimazole-betamethasone (LOTRISONE) cream Apply 1 application topically 2 (two) times daily as needed. 15 g 1  . dorzolamide-timolol (COSOPT) 22.3-6.8 MG/ML ophthalmic solution Place 1 drop into both eyes 2 (two) times daily.     . Flaxseed MISC by Does not apply route. Take 1 tsp daily    . methocarbamol (ROBAXIN) 500 MG tablet Take 1 tablet (500 mg total) by mouth every 8 (eight) hours as needed for muscle spasms. 20 tablet 1  . Multiple Vitamin (MULTIVITAMIN) tablet Take 1 tablet by mouth daily.      . pantoprazole (PROTONIX) 20 MG tablet TAKE ONE TABLET BY MOUTH ONCE DAILY 90 tablet 2  . polyethylene glycol (MIRALAX / GLYCOLAX) packet Take 17 g by mouth every 3 (three) days.    . predniSONE (DELTASONE) 2.5 MG tablet Take 1 tablet (2.5 mg total) by mouth daily. 90 tablet 3  . Probiotic Product (PROBIOTIC FORMULA PO) Take 1 tablet by mouth daily.      . traMADol (ULTRAM) 50 MG tablet Take 1 tablet (50 mg total) by mouth every 6 (six) hours as needed for moderate pain. 60 tablet 1   No current facility-administered medications on file prior  to visit.     Past Medical History:  Diagnosis Date  . Anxiety   . CAD (coronary artery disease)    RCA 40% stenosis cath 01/2011  . Cholelithiasis    s/p lap chole 09/2014  . Diverticular stricture [facility]) 2006   [facility]  . DIVERTICULITIS, HX OF   . Diverticulosis   . DYSLIPIDEMIA   . Elevated LFTs   . GERD   . Glaucoma   . Hepatic steatosis   . HOH (hard of hearing)   . Immune thrombocytopenic purpura (HCC)    chronic - baseline 80-100K, on pred  . Irritable bowel syndrome   . Left ovarian cyst dx 01/2013 CT   working with gyn, ?malignant - elevated tumor marker OVA1  . OSTEOARTHRITIS, KNEE, RIGHT   . OSTEOPENIA   . OVERACTIVE BLADDER   . UNSPECIFIED PERIPHERAL VASCULAR  DISEASE   . URINARY INCONTINENCE     Past Surgical History:  Procedure Laterality Date  . ABDOMINAL HYSTERECTOMY  1963  . ANGIOPLASTY    . APPENDECTOMY  1956  . CARDIAC CATHETERIZATION    . CATARACT EXTRACTION, BILATERAL  10/2010  . CHOLECYSTECTOMY N/A 09/16/2014   Procedure: LAPAROSCOPIC CHOLECYSTECTOMY ;  Surgeon: Emelia Loron, MD;  Location: [facility] OR;  Service: General;  Laterality: N/A;  . KNEE ARTHROSCOPY Right   . L pop PTA  10/2009   stent  . LAPAROSCOPIC SIGMOID COLECTOMY  10/2005  . SPLENECTOMY  1954  . VARICOSE VEIN SURGERY Right 1962    Social History   Social History  . Marital status: Married    Spouse name: N/A  . Number of children: N/A  . Years of education: N/A   Occupational History  . Retired     Programmer, multimedia   Social History Main Topics  . Smoking status: Never Smoker  . Smokeless tobacco: Never Used  . Alcohol use No     Comment: rarely  . Drug use: No  . Sexual activity: Not Asked   Other Topics Concern  . 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    [place] to [place] from [place] 05/2010 to be close to kids    Family History  Problem Relation Age of Onset  . Coronary artery disease Mother   . Heart attack Mother 13  . Hyperlipidemia Mother   . Hypertension Mother   . Stomach cancer Father   . Hypertension Daughter   . Hyperlipidemia Daughter   . Arthritis      parent  . Transient ischemic attack      parent  . Colon cancer Neg Hx     Review of Systems  Constitutional: Negative for chills and fever (none now, low grade at one point).  Skin: Positive for color change (right lower leg only). Negative for rash and wound.       Objective:   Vitals:   01/13/16 1602  BP: (!) 162/88  Pulse: 77  Resp: 16  Temp: 97.6 F (36.4 C)   Filed Weights   01/13/16 1602  Weight: 144 lb (65.3 kg)   Body mass index is 26.34 kg/m.   Physical Exam  Constitutional: She  appears well-developed and well-nourished. No distress.  Musculoskeletal: She exhibits no edema.  Neurological:  Normal sensation b/l LE, normal strength b/l LE  Skin: Skin is warm and dry. She is not diaphoretic. There is erythema (mild erythema right anterior lower leg along  shin, mildly tender, non-raised, non-blanching, no open wound).          Assessment & Plan:   See Problem List for Assessment and Plan of chronic medical problems.

## 2016-01-13 NOTE — Patient Instructions (Addendum)
Continue symptomatic treatment.   If your symptoms do not continue to improve please call or return.     Medications reviewed and updated.  No changes recommended at this time.

## 2016-01-15 DIAGNOSIS — M79606 Pain in leg, unspecified: Secondary | ICD-10-CM | POA: Insufficient documentation

## 2016-01-15 NOTE — Assessment & Plan Note (Signed)
Pain, erythema in right lower anterior leg that started after having a vascular US done Given abx by ortho for possible cellulitis - no change Symptoms improving over the past two weeks Possible trauma from the [place], no evidence of infection Symptomatic treatment only - if it does not continue to improve she will call or return

## 2016-01-24 ENCOUNTER — Telehealth: Payer: Self-pay | Admitting: Oncology

## 2016-01-24 NOTE — Telephone Encounter (Signed)
02/08/2016 Appointment rescheduled to 10/12 per patient request. The patient had a scheduling conflict.

## 2016-02-02 ENCOUNTER — Ambulatory Visit (HOSPITAL_COMMUNITY)
Admission: RE | Admit: 2016-02-02 | Discharge: 2016-02-02 | Disposition: A | Discharge status: Home / Self Care | Payer: Medicare Other | Source: Ambulatory Visit | Attending: Gynecologic Oncology | Admitting: Gynecologic Oncology

## 2016-02-02 DIAGNOSIS — N83209 Unspecified ovarian cyst, unspecified side: Secondary | ICD-10-CM | POA: Diagnosis present

## 2016-02-03 ENCOUNTER — Telehealth: Payer: Self-pay | Admitting: Gynecologic Oncology

## 2016-02-03 NOTE — Telephone Encounter (Signed)
Spoke with patient about [place] results.  Patient stating she has "been pretty good."  She reports the longest time she has had discomfort is for 3-4 days, last 3-4 months ago.  She states the pain is brought on when she is lifting something heavy or placing pressure on her abd or when constipated.  She states if Dr. Alycia Rossetti wants her to have surgery, it would have to be the first of the year because she has things she wants to do this year.  Advised her that I would notify Dr. Alycia Rossetti of the results and contact her with the recommendations.

## 2016-02-08 ENCOUNTER — Other Ambulatory Visit: Payer: Medicare Other

## 2016-02-08 ENCOUNTER — Ambulatory Visit: Payer: Medicare Other | Admitting: Oncology

## 2016-02-14 DIAGNOSIS — Z01419 Encounter for gynecological examination (general) (routine) without abnormal findings: Secondary | ICD-10-CM | POA: Diagnosis not present

## 2016-02-16 ENCOUNTER — Ambulatory Visit (HOSPITAL_BASED_OUTPATIENT_CLINIC_OR_DEPARTMENT_OTHER): Payer: Medicare Other | Admitting: Oncology

## 2016-02-16 ENCOUNTER — Telehealth: Payer: Self-pay | Admitting: Oncology

## 2016-02-16 ENCOUNTER — Other Ambulatory Visit (HOSPITAL_BASED_OUTPATIENT_CLINIC_OR_DEPARTMENT_OTHER): Payer: Medicare Other

## 2016-02-16 VITALS — BP 154/56 | HR 78 | Temp 97.5°F | Resp 18 | Ht 62.0 in | Wt 144.6 lb

## 2016-02-16 DIAGNOSIS — D693 Immune thrombocytopenic purpura: Secondary | ICD-10-CM

## 2016-02-16 LAB — CBC WITH DIFFERENTIAL/PLATELET
BASO%: 0.3 % (ref 0.0–2.0)
BASOS ABS: 0 10*3/uL (ref 0.0–0.1)
EOS ABS: 0.1 10*3/uL (ref 0.0–0.5)
EOS%: 0.7 % (ref 0.0–7.0)
HEMATOCRIT: 40.8 % (ref 34.8–46.6)
HEMOGLOBIN: 13.8 g/dL (ref 11.6–15.9)
LYMPH#: 3.2 10*3/uL (ref 0.9–3.3)
LYMPH%: 30.9 % (ref 14.0–49.7)
MCH: 28.9 pg (ref 25.1–34.0)
MCHC: 33.8 g/dL (ref 31.5–36.0)
MCV: 85.4 fL (ref 79.5–101.0)
MONO#: 0.5 10*3/uL (ref 0.1–0.9)
MONO%: 5.2 % (ref 0.0–14.0)
NEUT#: 6.4 10*3/uL (ref 1.5–6.5)
NEUT%: 62.9 % (ref 38.4–76.8)
PLATELETS: 174 10*3/uL (ref 145–400)
RBC: 4.78 10*6/uL (ref 3.70–5.45)
RDW: 14.5 % (ref 11.2–14.5)
WBC: 10.2 10*3/uL (ref 3.9–10.3)

## 2016-02-16 LAB — COMPREHENSIVE METABOLIC PANEL WITH GFR
ALT: 11 U/L (ref 0–55)
AST: 19 U/L (ref 5–34)
Albumin: 3.7 g/dL (ref 3.5–5.0)
Alkaline Phosphatase: 92 U/L (ref 40–150)
Anion Gap: 9 meq/L (ref 3–11)
BUN: 14.3 mg/dL (ref 7.0–26.0)
CO2: 27 meq/L (ref 22–29)
Calcium: 9.5 mg/dL (ref 8.4–10.4)
Chloride: 105 meq/L (ref 98–109)
Creatinine: 0.8 mg/dL (ref 0.6–1.1)
EGFR: 64 ml/min/1.73 m2 — ABNORMAL LOW
Glucose: 184 mg/dL — ABNORMAL HIGH (ref 70–140)
Potassium: 4.3 meq/L (ref 3.5–5.1)
Sodium: 142 meq/L (ref 136–145)
Total Bilirubin: 0.33 mg/dL (ref 0.20–1.20)
Total Protein: 7.4 g/dL (ref 6.4–8.3)

## 2016-02-16 NOTE — Telephone Encounter (Signed)
Gave patient avs report and appointments for April  °

## 2016-02-16 NOTE — Progress Notes (Signed)
Hematology and Oncology Follow Up Visit  Tamara Robertson [id number] [birth year] 82 y.o. 02/16/2016 3:52 PM   Principle Diagnosis: 82 year old with chronic ITP diagnosed in the 73s. She continues to be in remission at this time.  Prior Therapy:  She is S/P splenectomy and subsequently treated with high doses of steroids. The patient have had a complete response to steroids back in the 60s and all the way have had a few relapses. Every time she has a relapse she gets restarted on high-dose of steroids and she achieved a complete response. The most recent of relapses before her move to [facility] she was hospitalized for a platelet count of 8000 around the year 2000.  Current therapy: Prednisone to 2.5 mg every other day. She has been in remission since 2000.   Interim History: Tamara Robertson returns today for a follow up visit by himself. Since the last visit, she reports no recent complaints or changes in her health. She she is not reporting any bleeding, easy bruisability, or any skin changes. She has not reported any hematochezia or melena she has not reported any epistaxis or hemoptysis. He denied any issues related to steroids including hyperglycemia, fluid retention or weight gain. She continues to live independently and attends to activities of daily living. She keeps her self busy with church activities and will be visiting her family in [place]. She continues to drive short distances within a 5 mile radius where she lives. She denied any falls or syncope.  She has not reported any headaches or blurry vision or double vision. Has not reported any chest pain cough or hemoptysis. Has not reported any nausea or vomiting or abdominal pain. Has not reported any frequency urgency or hematuria. She does not report any skeletal complaints. Rest or view of systems unremarkable.   Medications: I have reviewed the patient's current medications. Unchanged by my review. Current Outpatient Prescriptions   Medication Sig Dispense Refill  . ALPRAZolam (XANAX) 0.25 MG tablet Take 1 tablet (0.25 mg total) by mouth 2 (two) times daily as needed for anxiety. 30 tablet 1  . Biotin 2500 MCG CAPS Take by mouth daily.    . Calcium Citrate (CITRACAL PO) One capful three times a week    . Calcium-Magnesium-Vitamin D (CITRACAL CALCIUM+D) 600-40-500 MG-MG-UNIT TB24 Take 1 tablet by mouth daily.     . clotrimazole-betamethasone (LOTRISONE) cream Apply 1 application topically 2 (two) times daily as needed. 15 g 1  . dorzolamide-timolol (COSOPT) 22.3-6.8 MG/ML ophthalmic solution Place 1 drop into both eyes 2 (two) times daily.     . Flaxseed MISC by Does not apply route. Take 1 tsp daily    . methocarbamol (ROBAXIN) 500 MG tablet Take 1 tablet (500 mg total) by mouth every 8 (eight) hours as needed for muscle spasms. 20 tablet 1  . Multiple Vitamin (MULTIVITAMIN) tablet Take 1 tablet by mouth daily.      . pantoprazole (PROTONIX) 20 MG tablet TAKE ONE TABLET BY MOUTH ONCE DAILY 90 tablet 2  . polyethylene glycol (MIRALAX / GLYCOLAX) packet Take 17 g by mouth every 3 (three) days.    . predniSONE (DELTASONE) 2.5 MG tablet Take 1 tablet (2.5 mg total) by mouth daily. 90 tablet 3  . Probiotic Product (PROBIOTIC FORMULA PO) Take 1 tablet by mouth daily.      . traMADol (ULTRAM) 50 MG tablet Take 1 tablet (50 mg total) by mouth every 6 (six) hours as needed for moderate pain. 60 tablet 1  No current facility-administered medications for this visit.     Allergies:  Allergies  Allergen Reactions  . Aspirin Other (See Comments)    ITP  . Contrast Media [Iodinated Diagnostic Agents]     hives  . Statins   . Sulfa Antibiotics Other (See Comments)    dizziness    Past Medical History, Surgical history, Social history, and Family History were reviewed and updated.  Physical Exam: Blood pressure (!) 154/56, pulse 78, temperature 97.5 F (36.4 C), temperature source Oral, resp. rate 18, height 5\' 2"  (1.575  m), weight 144 lb 9.6 oz (65.6 kg), SpO2 100 %. ECOG: 1 General appearance: Well-appearing woman without distress. Head: Normocephalic, without obvious abnormality no oral ulcers or lesions. Neck: no adenopathy.  Lymph nodes: Cervical, supraclavicular, and axillary nodes normal. Heart:regular rate and rhythm, S1, S2 normal, no murmur, click, rub or gallop Lung:chest clear, no wheezing, rales, normal symmetric air entry Abdomin: soft, non-tender, without masses or organomegaly no rebound or guarding. EXT:no erythema, induration, or nodules Neurological: No deficits noted. Skin: No ecchymosis or petechiae.    Lab Results: Lab Results  Component Value Date   WBC 10.2 02/16/2016   HGB 13.8 02/16/2016   HCT 40.8 02/16/2016   MCV 85.4 02/16/2016   PLT 174 02/16/2016     Chemistry      Component Value Date/Time   NA 141 11/30/2015 0904   NA 141 07/01/2015 1424   K 4.3 11/30/2015 0904   K 4.2 07/01/2015 1424   CL 104 11/30/2015 0904   CL 105 04/17/2012 1452   CO2 31 11/30/2015 0904   CO2 28 07/01/2015 1424   BUN 16 11/30/2015 0904   BUN 15.5 07/01/2015 1424   CREATININE 0.81 11/30/2015 0904   CREATININE 0.8 07/01/2015 1424      Component Value Date/Time   CALCIUM 9.4 11/30/2015 0904   CALCIUM 9.2 07/01/2015 1424   ALKPHOS 70 11/30/2015 0904   ALKPHOS 109 07/01/2015 1424   AST 19 11/30/2015 0904   AST 19 07/01/2015 1424   ALT 11 11/30/2015 0904   ALT 12 07/01/2015 1424   BILITOT 0.5 11/30/2015 0904   BILITOT <0.30 07/01/2015 1424      Impression and Plan:   82 year old woman with the following issues:  1. Chronic relapsing immune thrombocytopenia: He is status post multiple therapies in the past but have been in remission since your 2000.   She continues to be in remission on maintenance doses of low-dose prednisone. Risks and benefits of continuing those were reviewed today and she is agreeable to continue her current prednisone dose. She denied any recent  complications related to it and it appears to have maintained her disease in remission.  2. Age-appropriate cancer screening: She is up-to-date at this time.  3. Follow-up: Will be in 6 months.  Eli Hose, MD 10/12/20173:52 PM

## 2016-02-23 ENCOUNTER — Encounter: Payer: Self-pay | Admitting: Cardiology

## 2016-02-29 ENCOUNTER — Other Ambulatory Visit: Payer: Self-pay | Admitting: Gynecologic Oncology

## 2016-02-29 DIAGNOSIS — N83202 Unspecified ovarian cyst, left side: Secondary | ICD-10-CM

## 2016-03-07 NOTE — Progress Notes (Signed)
HPI The patient presents for evaluation of chest discomfort.  Since I last saw her she had ABIs at [facility].  These were normal.  She has done well from a cardiovascular standpoint.  The patient denies any new symptoms such as chest discomfort, neck or arm discomfort. There has been no new shortness of breath, PND or orthopnea. There have been no reported palpitations, presyncope or syncope.   Allergies  Allergen Reactions  . Aspirin Other (See Comments)    ITP  . Contrast Media [Iodinated Diagnostic Agents]     hives  . Statins   . Sulfa Antibiotics Other (See Comments)    dizziness    Current Outpatient Prescriptions  Medication Sig Dispense Refill  . Biotin 2500 MCG CAPS Take by mouth daily.    . Calcium Citrate (CITRACAL PO) One capful three times a week    . Calcium-Magnesium-Vitamin D (CITRACAL CALCIUM+D) 600-40-500 MG-MG-UNIT TB24 Take 1 tablet by mouth daily.     . clotrimazole-betamethasone (LOTRISONE) cream Apply 1 application topically 2 (two) times daily as needed. 15 g 1  . dorzolamide-timolol (COSOPT) 22.3-6.8 MG/ML ophthalmic solution Place 1 drop into both eyes 2 (two) times daily.     . Flaxseed MISC by Does not apply route. Take 1 tsp daily    . methocarbamol (ROBAXIN) 500 MG tablet Take 1 tablet (500 mg total) by mouth every 8 (eight) hours as needed for muscle spasms. 20 tablet 1  . Multiple Vitamin (MULTIVITAMIN) tablet Take 1 tablet by mouth daily.      . pantoprazole (PROTONIX) 20 MG tablet TAKE ONE TABLET BY MOUTH ONCE DAILY 90 tablet 2  . polyethylene glycol (MIRALAX / GLYCOLAX) packet Take 17 g by mouth every 3 (three) days.    . predniSONE (DELTASONE) 2.5 MG tablet Take 1 tablet (2.5 mg total) by mouth daily. 90 tablet 3  . Probiotic Product (PROBIOTIC FORMULA PO) Take 1 tablet by mouth daily.       No current facility-administered medications for this visit.     Past Medical History:  Diagnosis Date  . Anxiety   . CAD (coronary artery disease)    RCA  40% stenosis cath 01/2011  . Cholelithiasis    s/p lap chole 09/2014  . Diverticular stricture 2006   [facility]  . DIVERTICULITIS, HX OF   . Diverticulosis   . DYSLIPIDEMIA   . Elevated LFTs   . GERD   . Glaucoma   . Hepatic steatosis   . HOH (hard of hearing)   . Immune thrombocytopenic purpura (HCC)    chronic - baseline 80-100K, on pred  . Irritable bowel syndrome   . Left ovarian cyst dx 01/2013 CT   working with gyn, ?malignant - elevated tumor marker OVA1  . OSTEOARTHRITIS, KNEE, RIGHT   . OSTEOPENIA   . OVERACTIVE BLADDER   . UNSPECIFIED PERIPHERAL VASCULAR DISEASE   . URINARY INCONTINENCE     Past Surgical History:  Procedure Laterality Date  . ABDOMINAL HYSTERECTOMY  1963  . ANGIOPLASTY    . APPENDECTOMY  1956  . CARDIAC CATHETERIZATION    . CATARACT EXTRACTION, BILATERAL  10/2010  . CHOLECYSTECTOMY N/A 09/16/2014   Procedure: LAPAROSCOPIC CHOLECYSTECTOMY ;  Surgeon: Rolm Bookbinder, MD;  Location: [facility];  Service: General;  Laterality: N/A;  . KNEE ARTHROSCOPY Right   . L pop PTA  10/2009   stent  . LAPAROSCOPIC SIGMOID COLECTOMY  10/2005  . SPLENECTOMY  1954  . VARICOSE VEIN SURGERY Right  1962    ROS:   As stated in the HPI and negative for all other systems.   PHYSICAL EXAM BP 122/74   Pulse 70   Ht 5\' 2"  (1.575 m)   Wt 146 lb (66.2 kg)   BMI 26.70 kg/m  GENERAL:  Well appearing NECK:  No jugular venous distention, waveform within normal limits, carotid upstroke brisk and symmetric, no bruits, no thyromegaly LUNGS:  Clear to auscultation bilaterally CHEST:  Unremarkable HEART:  PMI not displaced or sustained,S1 and S2 within normal limits, no S3, no S4, no clicks, no rubs, no murmurs ABD:  Flat, positive bowel sounds normal in frequency in pitch, no bruits, no rebound, no guarding, no midline pulsatile mass, no hepatomegaly, no splenomegaly EXT:  2 plus pulses throughout, no edema, no cyanosis no clubbing  EKG:  Sinus rhythm,  rate 70, axis within normal limits, intervals normal limits, no acute ST-T wave changes   ASSESSMENT AND PLAN  HTN - The blood pressure is at target. No change in medications is indicated. We will continue with therapeutic lifestyle changes (TLC).  DYSLIPIDEMIA -  She does not want to take statins.  No change in therapy is indicated.     CAD - She has nonobstructive disease. No change in therapy is indicated.

## 2016-03-08 ENCOUNTER — Encounter: Payer: Self-pay | Admitting: Cardiology

## 2016-03-08 ENCOUNTER — Ambulatory Visit (INDEPENDENT_AMBULATORY_CARE_PROVIDER_SITE_OTHER): Payer: Medicare Other | Admitting: Cardiology

## 2016-03-08 VITALS — BP 122/74 | HR 70 | Ht 62.0 in | Wt 146.0 lb

## 2016-03-08 DIAGNOSIS — I10 Essential (primary) hypertension: Secondary | ICD-10-CM

## 2016-03-08 DIAGNOSIS — E785 Hyperlipidemia, unspecified: Secondary | ICD-10-CM | POA: Diagnosis not present

## 2016-03-08 NOTE — Patient Instructions (Signed)
Your physician wants you to follow-up in: 6 Months You will receive a reminder letter in the mail two months in advance. If you don't receive a letter, please call our office to schedule the follow-up appointment.  

## 2016-03-14 DIAGNOSIS — L820 Inflamed seborrheic keratosis: Secondary | ICD-10-CM | POA: Diagnosis not present

## 2016-03-14 DIAGNOSIS — B078 Other viral warts: Secondary | ICD-10-CM | POA: Diagnosis not present

## 2016-03-16 ENCOUNTER — Telehealth: Payer: Self-pay | Admitting: *Deleted

## 2016-03-16 DIAGNOSIS — H01005 Unspecified blepharitis left lower eyelid: Secondary | ICD-10-CM | POA: Diagnosis not present

## 2016-03-16 DIAGNOSIS — H01004 Unspecified blepharitis left upper eyelid: Secondary | ICD-10-CM | POA: Diagnosis not present

## 2016-03-16 NOTE — Telephone Encounter (Signed)
Notified patient of U/S appt scheduled on March 5,2018 @ 2:00. Pt agreed with time and date

## 2016-04-09 ENCOUNTER — Encounter: Payer: Self-pay | Admitting: Internal Medicine

## 2016-04-09 ENCOUNTER — Ambulatory Visit (INDEPENDENT_AMBULATORY_CARE_PROVIDER_SITE_OTHER): Payer: Medicare Other | Admitting: Internal Medicine

## 2016-04-09 ENCOUNTER — Ambulatory Visit: Payer: Medicare Other | Admitting: Family

## 2016-04-09 VITALS — BP 140/88 | HR 77 | Temp 97.8°F | Wt 147.0 lb

## 2016-04-09 DIAGNOSIS — J069 Acute upper respiratory infection, unspecified: Secondary | ICD-10-CM | POA: Diagnosis not present

## 2016-04-09 MED ORDER — AMOXICILLIN-POT CLAVULANATE 875-125 MG PO TABS: 1.0000 | ORAL_TABLET | Freq: Two times a day (BID) | ORAL | 0 refills | Status: DC

## 2016-04-09 NOTE — Patient Instructions (Signed)
Start taking augmentin as prescribed.  Your prescription(s) have been submitted to your pharmacy or been printed and provided for you. Please take as directed and contact our office if you believe you are having problem(s) with the medication(s) or have any questions.  If your symptoms worsen or fail to improve, please contact our office for further instruction, or in case of emergency go directly to the emergency room at the closest medical facility.   General Recommendations:    Please drink plenty of fluids.  Get plenty of rest   Sleep in humidified air  Use saline nasal sprays  Netti pot  OTC Medications:   Allergies - helps relieve runny nose, itchy eyes and sneezing   Claritin (generic loratidine), Allegra (fexofenidine), or Zyrtec (generic cyrterizine) for runny nose. These medications should not cause drowsiness.  Note - Benadryl (generic diphenhydramine) may be used however may cause drowsiness  Cough -   Delsym or Robitussin (generic dextromethorphan)  Expectorants - helps loosen mucus to ease removal   Mucinex (generic guaifenesin) as directed on the package.  Headaches / General Aches   Tylenol (generic acetaminophen) - DO NOT EXCEED 3 grams (3,000 mg) in a 24 hour time period   Sore Throat -   Salt water gargle   Chloraseptic (generic benzocaine) spray or lozenges / Sucrets (generic dyclonine)

## 2016-04-09 NOTE — Progress Notes (Signed)
Subjective:    Patient ID: Tamara Robertson, female    DOB: [birth year], 82 y.o.   MRN: [id number]  HPI She is here for an acute visit for cold symptoms.   Her symptoms started one week ago.  She has been taking nyquil and dayquil and it helps. She was concerned about taking them too long.   She has had fevers, chills, sweats, achiness, fatigue, PND, sore throat, nasal congestion, cough, mild diarrhea that just started today, and headaches.  She feels miserable and does not feel like she is getting better.    Medications and allergies reviewed with patient and updated if appropriate.  Patient Active Problem List   Diagnosis Date Noted  . Leg pain, anterior 01/15/2016  . Osteopenia 12/01/2015  . Anxiety 07/19/2015  . Degenerative cervical disc 12/28/2014  . Left ovarian cyst   . Peripheral neuropathy (HCC) 06/03/2012  . Insomnia   . PVD (peripheral vascular disease) (HCC) 11/23/2010  . Bursitis of hip 10/26/2010  . Hyperlipidemia 06/27/2010  . IMMUNE THROMBOCYTOPENIC PURPURA 06/27/2010  . GLUCOMA 06/27/2010  . GERD 06/27/2010  . IRRITABLE BOWEL SYNDROME 06/27/2010  . OVERACTIVE BLADDER 06/27/2010  . OSTEOARTHRITIS, KNEE, RIGHT 06/27/2010  . URINARY INCONTINENCE 06/27/2010    Current Outpatient Prescriptions on File Prior to Visit  Medication Sig Dispense Refill  . Biotin 2500 MCG CAPS Take by mouth daily.    . Calcium Citrate (CITRACAL PO) One capful three times a week    . Calcium-Magnesium-Vitamin D (CITRACAL CALCIUM+D) 600-40-500 MG-MG-UNIT TB24 Take 1 tablet by mouth daily.     . clotrimazole-betamethasone (LOTRISONE) cream Apply 1 application topically 2 (two) times daily as needed. 15 g 1  . dorzolamide-timolol (COSOPT) 22.3-6.8 MG/ML ophthalmic solution Place 1 drop into both eyes 2 (two) times daily.     . Flaxseed MISC by Does not apply route. Take 1 tsp daily    . methocarbamol (ROBAXIN) 500 MG tablet Take 1 tablet (500 mg total) by mouth every 8 (eight) hours as  needed for muscle spasms. 20 tablet 1  . Multiple Vitamin (MULTIVITAMIN) tablet Take 1 tablet by mouth daily.      . pantoprazole (PROTONIX) 20 MG tablet TAKE ONE TABLET BY MOUTH ONCE DAILY 90 tablet 2  . polyethylene glycol (MIRALAX / GLYCOLAX) packet Take 17 g by mouth every 3 (three) days.    . predniSONE (DELTASONE) 2.5 MG tablet Take 1 tablet (2.5 mg total) by mouth daily. 90 tablet 3  . Probiotic Product (PROBIOTIC FORMULA PO) Take 1 tablet by mouth daily.       No current facility-administered medications on file prior to visit.     Past Medical History:  Diagnosis Date  . Anxiety   . CAD (coronary artery disease)    RCA 40% stenosis cath 01/2011  . Cholelithiasis    s/p lap chole 09/2014  . Diverticular stricture 2006   [facility]  . DIVERTICULITIS, HX OF   . Diverticulosis   . DYSLIPIDEMIA   . Elevated LFTs   . GERD   . Glaucoma   . Hepatic steatosis   . HOH (hard of hearing)   . Immune thrombocytopenic purpura (HCC)    chronic - baseline 80-100K, on pred  . Irritable bowel syndrome   . Left ovarian cyst dx 01/2013 CT   working with gyn, ?malignant - elevated tumor marker OVA1  . OSTEOARTHRITIS, KNEE, RIGHT   . OSTEOPENIA   . OVERACTIVE BLADDER   . UNSPECIFIED PERIPHERAL  VASCULAR DISEASE   . URINARY INCONTINENCE     Past Surgical History:  Procedure Laterality Date  . ABDOMINAL HYSTERECTOMY  1963  . ANGIOPLASTY    . APPENDECTOMY  1956  . CARDIAC CATHETERIZATION    . CATARACT EXTRACTION, BILATERAL  10/2010  . CHOLECYSTECTOMY N/A 09/16/2014   Procedure: LAPAROSCOPIC CHOLECYSTECTOMY ;  Surgeon: Emelia Loron, MD;  [facility];  Service: General;  Laterality: N/A;  . KNEE ARTHROSCOPY Right   . L pop PTA  10/2009   stent  . LAPAROSCOPIC SIGMOID COLECTOMY  10/2005  . SPLENECTOMY  1954  . VARICOSE VEIN SURGERY Right 1962    Social History   Social History  . Marital status: Married    Spouse name: N/A  . Number of children: N/A  .  Years of education: N/A   Occupational History  . Retired     Programmer, multimedia   Social History Main Topics  . Smoking status: Never Smoker  . Smokeless tobacco: Never Used  . Alcohol use No     Comment: rarely  . Drug use: No  . Sexual activity: Not Asked   Other Topics Concern  . 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    [place] to [place] from [place] 05/2010 to be close to kids    Family History  Problem Relation Age of Onset  . Coronary artery disease Mother   . Heart attack Mother 19  . Hyperlipidemia Mother   . Hypertension Mother   . Stomach cancer Father   . Hypertension Daughter   . Hyperlipidemia Daughter   . Arthritis      parent  . Transient ischemic attack      parent  . Colon cancer Neg Hx     Review of Systems  Constitutional: Positive for appetite change (little decreased), diaphoresis, fatigue and fever.  HENT: Positive for congestion, postnasal drip and sore throat (improved). Negative for ear pain, sinus pain and sinus pressure.   Respiratory: Positive for cough. Negative for shortness of breath and wheezing.   Cardiovascular: Negative for chest pain.  Gastrointestinal: Positive for diarrhea (little ). Negative for nausea.  Musculoskeletal: Positive for myalgias.  Neurological: Positive for headaches.       Objective:   Vitals:   04/09/16 1620  BP: 140/88  Pulse: 77  Temp: 97.8 F (36.6 C)   Filed Weights   04/09/16 1620  Weight: 147 lb (66.7 kg)   Body mass index is 26.89 kg/m.   Physical Exam GENERAL APPEARANCE: Appears stated age, moderately ill appearing, NAD EYES: conjunctiva clear, no icterus HEENT: bilateral tympanic membranes and ear canals normal, oropharynx with mild erythema, no thyromegaly, trachea midline, no cervical or supraclavicular lymphadenopathy LUNGS: Clear to auscultation without wheeze or crackles, unlabored breathing, good air entry  bilaterally HEART: Normal S1,S2 without murmurs EXTREMITIES: Without clubbing, cyanosis, or edema        Assessment & Plan:   See Problem List for Assessment and Plan of chronic medical problems.

## 2016-04-09 NOTE — Assessment & Plan Note (Signed)
URI - possibly bacterial in nature Will start augmentin BID x 10 days Continue nyquil and dayquil Rest, fluids Call if no improvement

## 2016-04-18 DIAGNOSIS — K644 Residual hemorrhoidal skin tags: Secondary | ICD-10-CM | POA: Diagnosis not present

## 2016-04-19 ENCOUNTER — Other Ambulatory Visit (INDEPENDENT_AMBULATORY_CARE_PROVIDER_SITE_OTHER): Payer: Medicare Other

## 2016-04-19 ENCOUNTER — Telehealth: Payer: Self-pay | Admitting: *Deleted

## 2016-04-19 DIAGNOSIS — N3091 Cystitis, unspecified with hematuria: Secondary | ICD-10-CM

## 2016-04-19 LAB — URINALYSIS, ROUTINE W REFLEX MICROSCOPIC
Bilirubin Urine: NEGATIVE
Hgb urine dipstick: NEGATIVE
Ketones, ur: NEGATIVE
Nitrite: NEGATIVE
TOTAL PROTEIN, URINE-UPE24: NEGATIVE
URINE GLUCOSE: NEGATIVE
Urobilinogen, UA: 0.2 (ref 0.0–1.0)
pH: 6 (ref 5.0–8.0)

## 2016-04-19 NOTE — Telephone Encounter (Signed)
Left msg on triage stating saw her GYN yesterday due to hemorrhoids and vagina issues. She been having some bleeding. Woke up this am had some more ? If from hemorrhoids. She states she feels fine, but she is jsu concern about her platelet labs being low again from the bleeding. Wanting to know can she come by to check platelet labs...Johny Chess

## 2016-04-19 NOTE — Telephone Encounter (Signed)
Yes it is likely from hemorrhoids. Ok to check cbc.  If  Bleeding does not stop have her call or come in

## 2016-04-19 NOTE — Telephone Encounter (Signed)
Notified pt w/MD response. She is also wanting to have her urine recheck. She completed the antibiotic taht was given at last visit, but still having urine frequency and being cloudy. I add on UA w/culture...Tamara Robertson

## 2016-04-20 ENCOUNTER — Encounter: Payer: Self-pay | Admitting: Internal Medicine

## 2016-04-20 LAB — CBC WITH DIFFERENTIAL/PLATELET
BASOS ABS: 0.1 10*3/uL (ref 0.0–0.1)
Basophils Relative: 0.4 % (ref 0.0–3.0)
Eosinophils Absolute: 0.2 10*3/uL (ref 0.0–0.7)
Eosinophils Relative: 1.4 % (ref 0.0–5.0)
HCT: 39.7 % (ref 36.0–46.0)
Hemoglobin: 13.5 g/dL (ref 12.0–15.0)
LYMPHS ABS: 3.9 10*3/uL (ref 0.7–4.0)
Lymphocytes Relative: 28.4 % (ref 12.0–46.0)
MCHC: 34 g/dL (ref 30.0–36.0)
MCV: 84.7 fl (ref 78.0–100.0)
MONO ABS: 1 10*3/uL (ref 0.1–1.0)
MONOS PCT: 7.2 % (ref 3.0–12.0)
NEUTROS ABS: 8.7 10*3/uL — AB (ref 1.4–7.7)
NEUTROS PCT: 62.6 % (ref 43.0–77.0)
PLATELETS: 179 10*3/uL (ref 150.0–400.0)
RBC: 4.69 Mil/uL (ref 3.87–5.11)
RDW: 13.1 % (ref 11.5–15.5)
WBC: 13.8 10*3/uL — ABNORMAL HIGH (ref 4.0–10.5)

## 2016-04-25 ENCOUNTER — Other Ambulatory Visit: Payer: Self-pay | Admitting: Emergency Medicine

## 2016-04-25 DIAGNOSIS — R300 Dysuria: Secondary | ICD-10-CM

## 2016-04-25 DIAGNOSIS — M7062 Trochanteric bursitis, left hip: Secondary | ICD-10-CM | POA: Diagnosis not present

## 2016-04-25 DIAGNOSIS — M79652 Pain in left thigh: Secondary | ICD-10-CM | POA: Diagnosis not present

## 2016-04-25 NOTE — Progress Notes (Unsigned)
Per MD pt is to come back in to give urine specimen. Pt aware, orders have been entered.

## 2016-04-26 ENCOUNTER — Other Ambulatory Visit (INDEPENDENT_AMBULATORY_CARE_PROVIDER_SITE_OTHER): Payer: Medicare Other

## 2016-04-26 DIAGNOSIS — R300 Dysuria: Secondary | ICD-10-CM

## 2016-04-26 LAB — URINALYSIS
Bilirubin Urine: NEGATIVE
Hgb urine dipstick: NEGATIVE
KETONES UR: NEGATIVE
Leukocytes, UA: NEGATIVE
Nitrite: NEGATIVE
Total Protein, Urine: NEGATIVE
URINE GLUCOSE: NEGATIVE
UROBILINOGEN UA: 0.2 (ref 0.0–1.0)
pH: 6 (ref 5.0–8.0)

## 2016-04-27 ENCOUNTER — Other Ambulatory Visit: Payer: Self-pay | Admitting: Emergency Medicine

## 2016-04-27 LAB — URINE CULTURE: Organism ID, Bacteria: NO GROWTH

## 2016-04-27 MED ORDER — FLUCONAZOLE 150 MG PO TABS: 150.0000 mg | ORAL_TABLET | Freq: Once | ORAL | 0 refills | Status: AC

## 2016-04-28 ENCOUNTER — Encounter: Payer: Self-pay | Admitting: Internal Medicine

## 2016-05-17 ENCOUNTER — Other Ambulatory Visit: Payer: Self-pay | Admitting: Internal Medicine

## 2016-05-17 DIAGNOSIS — Z1231 Encounter for screening mammogram for malignant neoplasm of breast: Secondary | ICD-10-CM

## 2016-05-31 ENCOUNTER — Telehealth: Payer: Self-pay | Admitting: Cardiology

## 2016-05-31 NOTE — Telephone Encounter (Signed)
Patient inquiring about cardiovascular risk of continuing her calcium supplement. Sent to provider for review.

## 2016-05-31 NOTE — Telephone Encounter (Signed)
Mrs. Tamara Robertson is calling about the calicum supplement  D . States that they are caution older women about it because of heart attacks and cardio vascular risks  In older women . Wants to know should she keep taking it . Please call

## 2016-06-01 NOTE — Telephone Encounter (Signed)
No contraindication to her calcium supplementation.

## 2016-06-01 NOTE — Telephone Encounter (Signed)
Pt aware that she can take calcium supplement with no contraindication

## 2016-06-05 ENCOUNTER — Encounter: Payer: Self-pay | Admitting: Internal Medicine

## 2016-06-05 ENCOUNTER — Ambulatory Visit (INDEPENDENT_AMBULATORY_CARE_PROVIDER_SITE_OTHER): Payer: Medicare Other | Admitting: Internal Medicine

## 2016-06-05 VITALS — BP 144/84 | HR 105 | Temp 97.7°F | Resp 16 | Wt 149.0 lb

## 2016-06-05 DIAGNOSIS — R7303 Prediabetes: Secondary | ICD-10-CM | POA: Insufficient documentation

## 2016-06-05 DIAGNOSIS — M7072 Other bursitis of hip, left hip: Secondary | ICD-10-CM

## 2016-06-05 DIAGNOSIS — D693 Immune thrombocytopenic purpura: Secondary | ICD-10-CM

## 2016-06-05 DIAGNOSIS — K219 Gastro-esophageal reflux disease without esophagitis: Secondary | ICD-10-CM

## 2016-06-05 NOTE — Patient Instructions (Addendum)
  Medications reviewed and updated.  No changes recommended at this time.  A sample of pennsaid was given.  Try it on your left hip twice a day.      Please followup in 6 months

## 2016-06-05 NOTE — Assessment & Plan Note (Addendum)
Chronic relapsing immune thrombocytopenia: status post multiple therapies in the past but have been in remission since your 2000 No abnormal bruising or bleeding Stable on prednisone 2.5 mg every two days

## 2016-06-05 NOTE — Assessment & Plan Note (Signed)
GERD controlled Continue daily medication  

## 2016-06-05 NOTE — Assessment & Plan Note (Addendum)
Likely related to chronic prednisone Stressed regular exercise, diet low in sugar/carbohydrate

## 2016-06-05 NOTE — Assessment & Plan Note (Signed)
Seeing dr Lynann Bologna - has had injections Limited in oral nsaids due to thrombocytopenia Will try pennsaid

## 2016-06-05 NOTE — Progress Notes (Signed)
Subjective:    Patient ID: Tamara Robertson, female    DOB: 08/27/1929, 83 y.o.   MRN: [id number]  HPI The patient is here for follow up.  Thrombocytopenia:  She is follShe is following with oncology and is taking prednisone 2.5 mg every other day. She denies any abnormal bruising or bleeding.  GERD: She is taking the pantoprazole daily as prescribed. Her heartburn is well controlled.  Prediabetes:  She is compliant with a low sugar/carbohydrate diet.  She is exercising regularly, but minimally due to hip bursitis.  Bursitis in left hip: she saw Dr Charlett Blake and had two different injections. She was prescribed nabumetone and it is causing abdominal bloating and when she goes to the bathroom she only goes a little.   Sitting bothers her.  She uses heat and cold.   Medications and allergies reviewed with patient and updated if appropriate.  Patient Active Problem List   Diagnosis Date Noted  . Prediabetes 06/05/2016  . Leg pain, anterior 01/15/2016  . Osteopenia 12/01/2015  . Anxiety 07/19/2015  . Degenerative cervical disc 12/28/2014  . Left ovarian cyst   . Peripheral neuropathy (HCC) 06/03/2012  . Insomnia   . PVD (peripheral vascular disease) (HCC) 11/23/2010  . Bursitis of hip 10/26/2010  . Hyperlipidemia 06/27/2010  . IMMUNE THROMBOCYTOPENIC PURPURA 06/27/2010  . GLUCOMA 06/27/2010  . GERD 06/27/2010  . IRRITABLE BOWEL SYNDROME 06/27/2010  . OVERACTIVE BLADDER 06/27/2010  . OSTEOARTHRITIS, KNEE, RIGHT 06/27/2010  . URINARY INCONTINENCE 06/27/2010    Current Outpatient Prescriptions on File Prior to Visit  Medication Sig Dispense Refill  . Biotin 2500 MCG CAPS Take by mouth daily.    . Calcium-Magnesium-Vitamin D (CITRACAL CALCIUM+D) 600-40-500 MG-MG-UNIT TB24 Take 1 tablet by mouth daily.     . clotrimazole-betamethasone (LOTRISONE) cream Apply 1 application topically 2 (two) times daily as needed. 15 g 1  . dorzolamide-timolol (COSOPT) 22.3-6.8 MG/ML ophthalmic solution  Place 1 drop into both eyes 2 (two) times daily.     . Flaxseed MISC by Does not apply route. Take 1 tsp daily    . Multiple Vitamin (MULTIVITAMIN) tablet Take 1 tablet by mouth daily.      . pantoprazole (PROTONIX) 20 MG tablet TAKE ONE TABLET BY MOUTH ONCE DAILY 90 tablet 2  . polyethylene glycol (MIRALAX / GLYCOLAX) packet Take 17 g by mouth every 3 (three) days.    . predniSONE (DELTASONE) 2.5 MG tablet Take 1 tablet (2.5 mg total) by mouth daily. 90 tablet 3  . Probiotic Product (PROBIOTIC FORMULA PO) Take 1 tablet by mouth daily.      . methocarbamol (ROBAXIN) 500 MG tablet Take 1 tablet (500 mg total) by mouth every 8 (eight) hours as needed for muscle spasms. (Patient not taking: Reported on 06/05/2016) 20 tablet 1   No current facility-administered medications on file prior to visit.     Past Medical History:  Diagnosis Date  . Anxiety   . CAD (coronary artery disease)    RCA 40% stenosis cath 01/2011  . Cholelithiasis    s/p lap chole 09/2014  . Diverticular stricture 2006   [facility]  . DIVERTICULITIS, HX OF   . Diverticulosis   . DYSLIPIDEMIA   . Elevated LFTs   . GERD   . Glaucoma   . Hepatic steatosis   . HOH (hard of hearing)   . Immune thrombocytopenic purpura (HCC)    chronic - baseline 80-100K, on pred  . Irritable bowel syndrome   .  Left ovarian cyst dx 01/2013 CT   working with gyn, ?malignant - elevated tumor marker OVA1  . OSTEOARTHRITIS, KNEE, RIGHT   . OSTEOPENIA   . OVERACTIVE BLADDER   . UNSPECIFIED PERIPHERAL VASCULAR DISEASE   . URINARY INCONTINENCE     Past Surgical History:  Procedure Laterality Date  . ABDOMINAL HYSTERECTOMY  1963  . ANGIOPLASTY    . APPENDECTOMY  1956  . CARDIAC CATHETERIZATION    . CATARACT EXTRACTION, BILATERAL  10/2010  . CHOLECYSTECTOMY N/A 09/16/2014   Procedure: LAPAROSCOPIC CHOLECYSTECTOMY ;  Surgeon: Emelia Loron, MD;  Location: [facility] OR;  Service: General;  Laterality: N/A;  . KNEE ARTHROSCOPY  Right   . L pop PTA  10/2009   stent  . LAPAROSCOPIC SIGMOID COLECTOMY  10/2005  . SPLENECTOMY  1954  . VARICOSE VEIN SURGERY Right 1962    Social History   Social History  . Marital status: Married    Spouse name: N/A  . Number of children: N/A  . Years of education: N/A   Occupational History  . Retired     Programmer, multimedia   Social History Main Topics  . Smoking status: Never Smoker  . Smokeless tobacco: Never Used  . Alcohol use No     Comment: rarely  . Drug use: No  . Sexual activity: Not Asked   Other Topics Concern  . 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    [place] to [place] from [place] 05/2010 to be close to kids    Family History  Problem Relation Age of Onset  . Coronary artery disease Mother   . Heart attack Mother 5  . Hyperlipidemia Mother   . Hypertension Mother   . Stomach cancer Father   . Hypertension Daughter   . Hyperlipidemia Daughter   . Arthritis      parent  . Transient ischemic attack      parent  . Colon cancer Neg Hx     Review of Systems  Constitutional: Negative for chills and fever.  Respiratory: Negative for cough, shortness of breath and wheezing.   Cardiovascular: Positive for leg swelling (chronic). Negative for chest pain and palpitations.  Neurological: Negative for light-headedness and headaches.  Hematological: Does not bruise/bleed easily.       Objective:   Vitals:   06/05/16 1331  BP: (!) 144/84  Pulse: (!) 105  Resp: 16  Temp: 97.7 F (36.5 C)   Wt Readings from Last 3 Encounters:  06/05/16 149 lb (67.6 kg)  04/09/16 147 lb (66.7 kg)  03/08/16 146 lb (66.2 kg)   Body mass index is 27.25 kg/m.   Physical Exam    Constitutional: Appears well-developed and well-nourished. No distress.  HENT:  Head: Normocephalic and atraumatic.  Neck: Neck supple. No tracheal deviation present. No thyromegaly present.  No cervical  lymphadenopathy Cardiovascular: Normal rate, regular rhythm and normal heart sounds.   2/6 systolic murmur heard. No carotid bruit .  No edema Pulmonary/Chest: Effort normal and breath sounds normal. No respiratory distress. No has no wheezes. No rales.  Skin: Skin is warm and dry. Not diaphoretic.  Psychiatric: Normal mood and affect. Behavior is normal.      Assessment & Plan:    See Problem List for Assessment and Plan of chronic medical problems.

## 2016-06-05 NOTE — Progress Notes (Signed)
Pre visit review using our clinic review tool, if applicable. No additional management support is needed unless otherwise documented below in the visit note. 

## 2016-06-06 ENCOUNTER — Encounter: Payer: Self-pay | Admitting: Internal Medicine

## 2016-06-14 DIAGNOSIS — M25552 Pain in left hip: Secondary | ICD-10-CM | POA: Diagnosis not present

## 2016-06-14 DIAGNOSIS — M7062 Trochanteric bursitis, left hip: Secondary | ICD-10-CM | POA: Diagnosis not present

## 2016-06-15 ENCOUNTER — Ambulatory Visit: Payer: Medicare Other

## 2016-06-19 DIAGNOSIS — M7062 Trochanteric bursitis, left hip: Secondary | ICD-10-CM | POA: Diagnosis not present

## 2016-06-19 DIAGNOSIS — M25552 Pain in left hip: Secondary | ICD-10-CM | POA: Diagnosis not present

## 2016-06-22 DIAGNOSIS — M7062 Trochanteric bursitis, left hip: Secondary | ICD-10-CM | POA: Diagnosis not present

## 2016-06-22 DIAGNOSIS — M25552 Pain in left hip: Secondary | ICD-10-CM | POA: Diagnosis not present

## 2016-06-26 DIAGNOSIS — M7062 Trochanteric bursitis, left hip: Secondary | ICD-10-CM | POA: Diagnosis not present

## 2016-06-26 DIAGNOSIS — M25552 Pain in left hip: Secondary | ICD-10-CM | POA: Diagnosis not present

## 2016-06-27 ENCOUNTER — Telehealth: Payer: Self-pay | Admitting: *Deleted

## 2016-06-27 NOTE — Telephone Encounter (Signed)
Rec'd call pt states she was rx some [facility] magic mouth wash for some canker sores in her mouth from her previous MD which she no longer see. Requesting MD to refill. inform pt MD is out of office, but will forward to one of the cover MD for approval. Pls advise if ok to refill...Tamara Robertson

## 2016-06-27 NOTE — Telephone Encounter (Signed)
True canker sores are due to oral herpes infection (which is not the same as genital infection)  [facility] Magic Mouthwash is not an appropriate tx for this  Options would include OTC Abreva, or valtrex or acyclovir tx  Other option for pain would be kenalog in orabase if needed, thanks

## 2016-06-28 ENCOUNTER — Ambulatory Visit: Payer: Medicare Other

## 2016-06-28 DIAGNOSIS — M7062 Trochanteric bursitis, left hip: Secondary | ICD-10-CM | POA: Diagnosis not present

## 2016-06-28 DIAGNOSIS — M25552 Pain in left hip: Secondary | ICD-10-CM | POA: Diagnosis not present

## 2016-06-28 NOTE — Telephone Encounter (Signed)
Left message advising patient to call back to get dr Judi Cong instructions---can talk with tamara if any questions

## 2016-06-29 DIAGNOSIS — H401131 Primary open-angle glaucoma, bilateral, mild stage: Secondary | ICD-10-CM | POA: Diagnosis not present

## 2016-07-02 ENCOUNTER — Other Ambulatory Visit: Payer: Self-pay | Admitting: Internal Medicine

## 2016-07-03 DIAGNOSIS — M25552 Pain in left hip: Secondary | ICD-10-CM | POA: Diagnosis not present

## 2016-07-03 DIAGNOSIS — M7062 Trochanteric bursitis, left hip: Secondary | ICD-10-CM | POA: Diagnosis not present

## 2016-07-05 DIAGNOSIS — M25552 Pain in left hip: Secondary | ICD-10-CM | POA: Diagnosis not present

## 2016-07-05 DIAGNOSIS — M7062 Trochanteric bursitis, left hip: Secondary | ICD-10-CM | POA: Diagnosis not present

## 2016-07-09 ENCOUNTER — Ambulatory Visit (HOSPITAL_COMMUNITY)
Admission: RE | Admit: 2016-07-09 | Discharge: 2016-07-09 | Disposition: A | Discharge status: Home / Self Care | Payer: Medicare Other | Source: Ambulatory Visit | Attending: Gynecologic Oncology | Admitting: Gynecologic Oncology

## 2016-07-09 DIAGNOSIS — Z90710 Acquired absence of both cervix and uterus: Secondary | ICD-10-CM | POA: Insufficient documentation

## 2016-07-09 DIAGNOSIS — N83202 Unspecified ovarian cyst, left side: Secondary | ICD-10-CM | POA: Insufficient documentation

## 2016-07-09 DIAGNOSIS — Z90721 Acquired absence of ovaries, unilateral: Secondary | ICD-10-CM | POA: Diagnosis not present

## 2016-07-10 ENCOUNTER — Other Ambulatory Visit: Payer: Self-pay

## 2016-07-12 DIAGNOSIS — M25552 Pain in left hip: Secondary | ICD-10-CM | POA: Diagnosis not present

## 2016-07-12 DIAGNOSIS — M7062 Trochanteric bursitis, left hip: Secondary | ICD-10-CM | POA: Diagnosis not present

## 2016-07-13 ENCOUNTER — Ambulatory Visit
Admission: RE | Admit: 2016-07-13 | Discharge: 2016-07-13 | Disposition: A | Discharge status: Home / Self Care | Payer: Medicare Other | Source: Ambulatory Visit | Attending: Internal Medicine | Admitting: Internal Medicine

## 2016-07-13 DIAGNOSIS — Z1231 Encounter for screening mammogram for malignant neoplasm of breast: Secondary | ICD-10-CM | POA: Diagnosis not present

## 2016-07-19 DIAGNOSIS — M25552 Pain in left hip: Secondary | ICD-10-CM | POA: Diagnosis not present

## 2016-07-19 DIAGNOSIS — M7062 Trochanteric bursitis, left hip: Secondary | ICD-10-CM | POA: Diagnosis not present

## 2016-07-26 DIAGNOSIS — M7062 Trochanteric bursitis, left hip: Secondary | ICD-10-CM | POA: Diagnosis not present

## 2016-07-26 DIAGNOSIS — M25552 Pain in left hip: Secondary | ICD-10-CM | POA: Diagnosis not present

## 2016-07-27 ENCOUNTER — Telehealth: Payer: Self-pay

## 2016-07-27 DIAGNOSIS — N83202 Unspecified ovarian cyst, left side: Secondary | ICD-10-CM

## 2016-07-27 NOTE — Telephone Encounter (Signed)
-----   Message from Nancy Marus, MD sent at 07/15/2016  9:34 AM EDT ----- Regarding: RE: ultrasound result Please let her know that they recommend follow up ultrasound in 6 months. Please ask her about her level of pain and if it is stable. Thanks! P ----- Message ----- From: Daralene Milch, RN Sent: 07/14/2016  12:35 PM To: Nancy Marus, MD Subject: ultrasound result                              Pt called for results of ultrasound. Pls let me know what you would like for a follow up. ----- Message ----- From: Interface, Rad Results In Sent: 07/09/2016   5:02 PM To: Dorothyann Gibbs, NP

## 2016-07-27 NOTE — Telephone Encounter (Signed)
Told Tamara Robertson the results of the [place] as noted below by Dr. Alycia Rossetti. Tamara Taddeo states that her pain is stable.  She will call if any increase in pain prior to next [place] in 9-18.

## 2016-07-27 NOTE — Telephone Encounter (Signed)
Order place for Non OB transvaginal US ~01-09-17.

## 2016-07-29 NOTE — Patient Instructions (Addendum)
  Test(s) ordered today. Your results will be released to [facility] (or called to you) after review, usually within 72hours after test completion. If any changes need to be made, you will be notified at that same time.    Medications reviewed and updated.  Changes include staring hydrochlorothiazide 12. 5 mg daily.  If you have any side effects please call.   Your prescription(s) have been submitted to your pharmacy. Please take as directed and contact our office if you believe you are having problem(s) with the medication(s).  A dexa was ordered  Please followup in 3-4 weeks

## 2016-07-29 NOTE — Progress Notes (Signed)
Subjective:    Patient ID: Tamara Robertson, female    DOB: Dec 16, 1929, 83 y.o.   MRN: [id number]  HPI The patient is here for follow up.  Left hip bursitis:  She is doing PT. She has had cortisone injections and they have not helped.  She is not able to walk and has not been exercising.   Elevated blood pressure:  Her BP has been high at home.  She is not sure why - ? Related to decreased exercise.  She also has increased stress.  She is compliant with a low sodium diet.  She denies chest pain, palpitations, headaches and SOB.  Swelling in legs:  It has been worse for three weeks.  She is wearing light weight compression socks.   There has been no changes in medications/supplements.  She is compliant with a low sodium diet.    Medications and allergies reviewed with patient and updated if appropriate.  Patient Active Problem List   Diagnosis Date Noted  . Prediabetes 06/05/2016  . Leg pain, anterior 01/15/2016  . Osteopenia 12/01/2015  . Anxiety 07/19/2015  . Degenerative cervical disc 12/28/2014  . Left ovarian cyst   . Peripheral neuropathy (HCC) 06/03/2012  . Insomnia   . PVD (peripheral vascular disease) (HCC) 11/23/2010  . Bursitis of hip 10/26/2010  . Hyperlipidemia 06/27/2010  . IMMUNE THROMBOCYTOPENIC PURPURA 06/27/2010  . GLUCOMA 06/27/2010  . GERD 06/27/2010  . IRRITABLE BOWEL SYNDROME 06/27/2010  . OVERACTIVE BLADDER 06/27/2010  . OSTEOARTHRITIS, KNEE, RIGHT 06/27/2010  . URINARY INCONTINENCE 06/27/2010    Current Outpatient Prescriptions on File Prior to Visit  Medication Sig Dispense Refill  . Biotin 2500 MCG CAPS Take by mouth daily.    . Calcium-Magnesium-Vitamin D (CITRACAL CALCIUM+D) 600-40-500 MG-MG-UNIT TB24 Take 1 tablet by mouth daily.     . clotrimazole-betamethasone (LOTRISONE) cream Apply 1 application topically 2 (two) times daily as needed. 15 g 1  . dorzolamide-timolol (COSOPT) 22.3-6.8 MG/ML ophthalmic solution Place 1 drop into both eyes once.      . Flaxseed MISC by Does not apply route. Take 1 tsp daily    . Multiple Vitamin (MULTIVITAMIN) tablet Take 1 tablet by mouth daily.      . pantoprazole (PROTONIX) 20 MG tablet TAKE ONE TABLET BY MOUTH ONCE DAILY 90 tablet 2  . polyethylene glycol (MIRALAX / GLYCOLAX) packet Take 17 g by mouth every 3 (three) days.    . predniSONE (DELTASONE) 2.5 MG tablet Take 1 tablet (2.5 mg total) by mouth daily. 90 tablet 3  . Probiotic Product (PROBIOTIC FORMULA PO) Take 1 tablet by mouth daily.       No current facility-administered medications on file prior to visit.     Past Medical History:  Diagnosis Date  . Anxiety   . CAD (coronary artery disease)    RCA 40% stenosis cath 01/2011  . Cholelithiasis    s/p lap chole 09/2014  . Diverticular stricture 2006   [facility]  . DIVERTICULITIS, HX OF   . Diverticulosis   . DYSLIPIDEMIA   . Elevated LFTs   . GERD   . Glaucoma   . Hepatic steatosis   . HOH (hard of hearing)   . Immune thrombocytopenic purpura (HCC)    chronic - baseline 80-100K, on pred  . Irritable bowel syndrome   . Left ovarian cyst dx 01/2013 CT   working with gyn, ?malignant - elevated tumor marker OVA1  . OSTEOARTHRITIS, KNEE, RIGHT   .  OSTEOPENIA   . OVERACTIVE BLADDER   . UNSPECIFIED PERIPHERAL VASCULAR DISEASE   . URINARY INCONTINENCE     Past Surgical History:  Procedure Laterality Date  . ABDOMINAL HYSTERECTOMY  1963  . ANGIOPLASTY    . APPENDECTOMY  1956  . CARDIAC CATHETERIZATION    . CATARACT EXTRACTION, BILATERAL  10/2010  . CHOLECYSTECTOMY N/A 09/16/2014   Procedure: LAPAROSCOPIC CHOLECYSTECTOMY ;  Surgeon: Emelia Loron, MD;  Location: [facility] OR;  Service: General;  Laterality: N/A;  . KNEE ARTHROSCOPY Right   . L pop PTA  10/2009   stent  . LAPAROSCOPIC SIGMOID COLECTOMY  10/2005  . SPLENECTOMY  1954  . VARICOSE VEIN SURGERY Right 1962    Social History   Social History  . Marital status: Widowed    Spouse name: N/A  .  Number of children: N/A  . Years of education: N/A   Occupational History  . Retired     Programmer, multimedia   Social History Main Topics  . Smoking status: Never Smoker  . Smokeless tobacco: Never Used  . Alcohol use No     Comment: rarely  . Drug use: No  . Sexual activity: Not on file   Other Topics Concern  . 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    [place] to [place] from [place] 05/2010 to be close to kids    Family History  Problem Relation Age of Onset  . Coronary artery disease Mother   . Heart attack Mother 61  . Hyperlipidemia Mother   . Hypertension Mother   . Stomach cancer Father   . Hypertension Daughter   . Hyperlipidemia Daughter   . Arthritis      parent  . Transient ischemic attack      parent  . Colon cancer Neg Hx     Review of Systems  Constitutional: Negative for chills and fever.  Respiratory: Negative for cough, shortness of breath and wheezing.   Cardiovascular: Positive for leg swelling. Negative for chest pain and palpitations.  Musculoskeletal: Positive for arthralgias.  Neurological: Negative for light-headedness and headaches.       Objective:   Vitals:   07/31/16 1551  BP: (!) 162/70  Pulse: 86  Temp: 97.6 F (36.4 C)   Wt Readings from Last 3 Encounters:  07/31/16 149 lb 1.9 oz (67.6 kg)  06/05/16 149 lb (67.6 kg)  04/09/16 147 lb (66.7 kg)   Body mass index is 27.27 kg/m.   Physical Exam    Constitutional: Appears well-developed and well-nourished. No distress.  HENT:  Head: Normocephalic and atraumatic.  Neck: Neck supple. No tracheal deviation present. No thyromegaly present.  No cervical lymphadenopathy Cardiovascular: Normal rate, regular rhythm and normal heart sounds.   No murmur heard. No carotid bruit .  Mild non pitting edema Pulmonary/Chest: Effort normal and breath sounds normal. No respiratory distress. No has no wheezes. No rales.    Skin: Skin is warm and dry. Not diaphoretic.  Psychiatric: Normal mood and affect. Behavior is normal.      Assessment & Plan:    See Problem List for Assessment and Plan of chronic medical problems.

## 2016-07-31 ENCOUNTER — Ambulatory Visit (INDEPENDENT_AMBULATORY_CARE_PROVIDER_SITE_OTHER): Payer: Medicare Other | Admitting: Internal Medicine

## 2016-07-31 ENCOUNTER — Other Ambulatory Visit (INDEPENDENT_AMBULATORY_CARE_PROVIDER_SITE_OTHER): Payer: Medicare Other

## 2016-07-31 ENCOUNTER — Encounter: Payer: Self-pay | Admitting: Internal Medicine

## 2016-07-31 VITALS — BP 162/70 | HR 86 | Temp 97.6°F | Ht 62.0 in | Wt 149.1 lb

## 2016-07-31 DIAGNOSIS — Z13820 Encounter for screening for osteoporosis: Secondary | ICD-10-CM | POA: Diagnosis not present

## 2016-07-31 DIAGNOSIS — R7303 Prediabetes: Secondary | ICD-10-CM

## 2016-07-31 DIAGNOSIS — I10 Essential (primary) hypertension: Secondary | ICD-10-CM

## 2016-07-31 DIAGNOSIS — G6289 Other specified polyneuropathies: Secondary | ICD-10-CM

## 2016-07-31 DIAGNOSIS — M85869 Other specified disorders of bone density and structure, unspecified lower leg: Secondary | ICD-10-CM

## 2016-07-31 DIAGNOSIS — D693 Immune thrombocytopenic purpura: Secondary | ICD-10-CM

## 2016-07-31 DIAGNOSIS — R600 Localized edema: Secondary | ICD-10-CM | POA: Diagnosis not present

## 2016-07-31 LAB — CBC WITH DIFFERENTIAL/PLATELET
BASOS PCT: 1 % (ref 0.0–3.0)
Basophils Absolute: 0.1 10*3/uL (ref 0.0–0.1)
EOS ABS: 0.3 10*3/uL (ref 0.0–0.7)
Eosinophils Relative: 2.7 % (ref 0.0–5.0)
HEMATOCRIT: 41 % (ref 36.0–46.0)
Hemoglobin: 13.6 g/dL (ref 12.0–15.0)
LYMPHS ABS: 3.6 10*3/uL (ref 0.7–4.0)
LYMPHS PCT: 35.2 % (ref 12.0–46.0)
MCHC: 33.1 g/dL (ref 30.0–36.0)
MCV: 85.3 fl (ref 78.0–100.0)
Monocytes Absolute: 1.1 10*3/uL — ABNORMAL HIGH (ref 0.1–1.0)
Monocytes Relative: 10.5 % (ref 3.0–12.0)
NEUTROS ABS: 5.2 10*3/uL (ref 1.4–7.7)
NEUTROS PCT: 50.6 % (ref 43.0–77.0)
PLATELETS: 173 10*3/uL (ref 150.0–400.0)
RBC: 4.8 Mil/uL (ref 3.87–5.11)
RDW: 13.5 % (ref 11.5–15.5)
WBC: 10.2 10*3/uL (ref 4.0–10.5)

## 2016-07-31 LAB — COMPREHENSIVE METABOLIC PANEL
ALT: 12 U/L (ref 0–35)
AST: 19 U/L (ref 0–37)
Albumin: 4.2 g/dL (ref 3.5–5.2)
Alkaline Phosphatase: 86 U/L (ref 39–117)
BILIRUBIN TOTAL: 0.3 mg/dL (ref 0.2–1.2)
BUN: 16 mg/dL (ref 6–23)
CALCIUM: 9.5 mg/dL (ref 8.4–10.5)
CO2: 29 meq/L (ref 19–32)
CREATININE: 0.75 mg/dL (ref 0.40–1.20)
Chloride: 103 mEq/L (ref 96–112)
GFR: 78.08 mL/min (ref >60.00)
GLUCOSE: 117 mg/dL — AB (ref 70–99)
Potassium: 4.1 mEq/L (ref 3.5–5.1)
SODIUM: 139 meq/L (ref 135–145)
Total Protein: 7.2 g/dL (ref 6.0–8.3)

## 2016-07-31 LAB — HEMOGLOBIN A1C: HEMOGLOBIN A1C: 5.8 % (ref 4.6–6.5)

## 2016-07-31 LAB — TSH: TSH: 2.61 u[IU]/mL (ref 0.35–4.50)

## 2016-07-31 MED ORDER — HYDROCHLOROTHIAZIDE 12.5 MG PO TABS: 12.5000 mg | ORAL_TABLET | Freq: Every day | ORAL | 3 refills | Status: DC

## 2016-07-31 NOTE — Assessment & Plan Note (Signed)
Due for a dexa Ordered Taking calcium and vitamin d Not able to exercise now

## 2016-07-31 NOTE — Assessment & Plan Note (Addendum)
BP has been elevated recently, legs also swollen Start hctz 12.5 mg daily Check labs f/u in 3-4 weeks

## 2016-07-31 NOTE — Assessment & Plan Note (Addendum)
intermittent - tingling sensation in ankles Symptoms tolerable Check B12 Monitor for now

## 2016-07-31 NOTE — Assessment & Plan Note (Signed)
Check a1c 

## 2016-07-31 NOTE — Assessment & Plan Note (Signed)
?   Cause No change in meds/supplements She is exercising less which may be contributing Start hctz 12. 5 mg daily cmp f/u in 3-4 weeks

## 2016-07-31 NOTE — Progress Notes (Signed)
Pre visit review using our clinic review tool, if applicable. No additional management support is needed unless otherwise documented below in the visit note. 

## 2016-07-31 NOTE — Assessment & Plan Note (Signed)
On chronic prednisone Check cbc

## 2016-08-01 LAB — VITAMIN B12: Vitamin B-12: 421 pg/mL (ref 211–911)

## 2016-08-02 ENCOUNTER — Encounter: Payer: Self-pay | Admitting: Internal Medicine

## 2016-08-10 ENCOUNTER — Telehealth: Payer: Self-pay | Admitting: Oncology

## 2016-08-10 NOTE — Telephone Encounter (Signed)
Called to verify appointment change to 5/4 and patient said "No way [birth year]'s my [birth year].  Let me have something the end of May"  I reschedule her for 5/24 12:45 lab and 1:15 to see Dr Alen Blew

## 2016-08-14 ENCOUNTER — Encounter: Payer: Self-pay | Admitting: Internal Medicine

## 2016-08-14 DIAGNOSIS — H401431 Capsular glaucoma with pseudoexfoliation of lens, bilateral, mild stage: Secondary | ICD-10-CM | POA: Diagnosis not present

## 2016-08-15 ENCOUNTER — Telehealth: Payer: Self-pay | Admitting: Internal Medicine

## 2016-08-15 ENCOUNTER — Other Ambulatory Visit: Payer: Medicare Other

## 2016-08-15 NOTE — Telephone Encounter (Signed)
Closing encounter MD has replied back to pt email...Tamara Robertson

## 2016-08-15 NOTE — Telephone Encounter (Signed)
Pt called regarding email sent yesterday. BP is low.

## 2016-08-16 ENCOUNTER — Other Ambulatory Visit: Payer: Medicare Other

## 2016-08-16 ENCOUNTER — Ambulatory Visit: Payer: Medicare Other | Admitting: Oncology

## 2016-08-22 ENCOUNTER — Ambulatory Visit (INDEPENDENT_AMBULATORY_CARE_PROVIDER_SITE_OTHER)
Admission: RE | Admit: 2016-08-22 | Discharge: 2016-08-22 | Disposition: A | Discharge status: Home / Self Care | Payer: Medicare Other | Source: Ambulatory Visit | Attending: Internal Medicine | Admitting: Internal Medicine

## 2016-08-22 DIAGNOSIS — M85869 Other specified disorders of bone density and structure, unspecified lower leg: Secondary | ICD-10-CM

## 2016-08-22 DIAGNOSIS — Z13820 Encounter for screening for osteoporosis: Secondary | ICD-10-CM | POA: Diagnosis not present

## 2016-08-29 ENCOUNTER — Ambulatory Visit (INDEPENDENT_AMBULATORY_CARE_PROVIDER_SITE_OTHER): Payer: Medicare Other | Admitting: Internal Medicine

## 2016-08-29 ENCOUNTER — Encounter: Payer: Self-pay | Admitting: Internal Medicine

## 2016-08-29 VITALS — BP 142/78 | HR 84 | Temp 97.4°F | Resp 16 | Wt 149.0 lb

## 2016-08-29 DIAGNOSIS — M8580 Other specified disorders of bone density and structure, unspecified site: Secondary | ICD-10-CM | POA: Diagnosis not present

## 2016-08-29 DIAGNOSIS — R600 Localized edema: Secondary | ICD-10-CM | POA: Diagnosis not present

## 2016-08-29 DIAGNOSIS — I10 Essential (primary) hypertension: Secondary | ICD-10-CM | POA: Diagnosis not present

## 2016-08-29 NOTE — Progress Notes (Signed)
Subjective:    Patient ID: Tamara Robertson, female    DOB: Jun 06, 1929, 83 y.o.   MRN: [id number]  HPI She is here for follow up.  Osteopenia with high FRAX:  She is walking if the weather is ok.  She taking calcium and vitamin d.  She has never been on medication previously.  Hypertension: She is taking her medication daily. She is compliant with a low sodium diet.  She denies chest pain, palpitations, shortness of breath and regular headaches. She is exercising regularly - hip exercises, walking weather permitting.  She does monitor her blood pressure at home - 109/53 - 157/74.  Overall - controlled.   Leg edema:  We started her on hctz 12.5 mg.  She has varicose veins in both legs.  They are tender sometimes.  She has seen vascular in the past and does not think she needs to go back now.  She can not get the compression socks off and and can not find toeless socks in light compression.    Medications and allergies reviewed with patient and updated if appropriate.  Patient Active Problem List   Diagnosis Date Noted  . Hypertension 07/31/2016  . Leg edema 07/31/2016  . Prediabetes 06/05/2016  . Leg pain, anterior 01/15/2016  . Osteopenia 12/01/2015  . Anxiety 07/19/2015  . Degenerative cervical disc 12/28/2014  . Left ovarian cyst   . Peripheral neuropathy 06/03/2012  . Insomnia   . PVD (peripheral vascular disease) (HCC) 11/23/2010  . Bursitis of hip 10/26/2010  . Hyperlipidemia 06/27/2010  . IMMUNE THROMBOCYTOPENIC PURPURA 06/27/2010  . GLUCOMA 06/27/2010  . GERD 06/27/2010  . IRRITABLE BOWEL SYNDROME 06/27/2010  . OVERACTIVE BLADDER 06/27/2010  . OSTEOARTHRITIS, KNEE, RIGHT 06/27/2010  . URINARY INCONTINENCE 06/27/2010    Current Outpatient Prescriptions on File Prior to Visit  Medication Sig Dispense Refill  . Biotin 2500 MCG CAPS Take by mouth daily.    . Calcium-Magnesium-Vitamin D (CITRACAL CALCIUM+D) 600-40-500 MG-MG-UNIT TB24 Take 1 tablet by mouth daily.     .  clotrimazole-betamethasone (LOTRISONE) cream Apply 1 application topically 2 (two) times daily as needed. 15 g 1  . Flaxseed MISC by Does not apply route. Take 1 tsp daily    . hydrochlorothiazide (HYDRODIURIL) 12.5 MG tablet Take 1 tablet (12.5 mg total) by mouth daily. 30 tablet 3  . Multiple Vitamin (MULTIVITAMIN) tablet Take 1 tablet by mouth daily.      . pantoprazole (PROTONIX) 20 MG tablet TAKE ONE TABLET BY MOUTH ONCE DAILY 90 tablet 2  . polyethylene glycol (MIRALAX / GLYCOLAX) packet Take 17 g by mouth every 3 (three) days.    . predniSONE (DELTASONE) 2.5 MG tablet Take 1 tablet (2.5 mg total) by mouth daily. 90 tablet 3  . Probiotic Product (PROBIOTIC FORMULA PO) Take 1 tablet by mouth daily.       No current facility-administered medications on file prior to visit.     Past Medical History:  Diagnosis Date  . Anxiety   . CAD (coronary artery disease)    RCA 40% stenosis cath 01/2011  . Cholelithiasis    s/p lap chole 09/2014  . Diverticular stricture 2006   [facility]  . DIVERTICULITIS, HX OF   . Diverticulosis   . DYSLIPIDEMIA   . Elevated LFTs   . GERD   . Glaucoma   . Hepatic steatosis   . HOH (hard of hearing)   . Immune thrombocytopenic purpura (HCC)    chronic - baseline  80-100K, on pred  . Irritable bowel syndrome   . Left ovarian cyst dx 01/2013 CT   working with gyn, ?malignant - elevated tumor marker OVA1  . OSTEOARTHRITIS, KNEE, RIGHT   . OSTEOPENIA   . OVERACTIVE BLADDER   . UNSPECIFIED PERIPHERAL VASCULAR DISEASE   . URINARY INCONTINENCE     Past Surgical History:  Procedure Laterality Date  . ABDOMINAL HYSTERECTOMY  1963  . ANGIOPLASTY    . APPENDECTOMY  1956  . CARDIAC CATHETERIZATION    . CATARACT EXTRACTION, BILATERAL  10/2010  . CHOLECYSTECTOMY N/A 09/16/2014   Procedure: LAPAROSCOPIC CHOLECYSTECTOMY ;  Surgeon: Emelia Loron, MD;  Location: [facility] OR;  Service: General;  Laterality: N/A;  . KNEE ARTHROSCOPY Right   . L  pop PTA  10/2009   stent  . LAPAROSCOPIC SIGMOID COLECTOMY  10/2005  . SPLENECTOMY  1954  . VARICOSE VEIN SURGERY Right 1962    Social History   Social History  . Marital status: Widowed    Spouse name: N/A  . Number of children: N/A  . Years of education: N/A   Occupational History  . Retired     Programmer, multimedia   Social History Main Topics  . Smoking status: Never Smoker  . Smokeless tobacco: Never Used  . Alcohol use No     Comment: rarely  . Drug use: No  . Sexual activity: Not on file   Other Topics Concern  . 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    [place] to [place] from [place] 05/2010 to be close to kids    Family History  Problem Relation Age of Onset  . Coronary artery disease Mother   . Heart attack Mother 44  . Hyperlipidemia Mother   . Hypertension Mother   . Stomach cancer Father   . Hypertension Daughter   . Hyperlipidemia Daughter   . Arthritis      parent  . Transient ischemic attack      parent  . Colon cancer Neg Hx     Review of Systems  Constitutional: Negative for fever.  Respiratory: Negative for cough, shortness of breath and wheezing.   Cardiovascular: Positive for leg swelling. Negative for chest pain and palpitations.  Neurological: Negative for light-headedness and headaches.       Objective:   Vitals:   08/29/16 1434  BP: (!) 142/78  Pulse: 84  Resp: 16  Temp: 97.4 F (36.3 C)   Filed Weights   08/29/16 1434  Weight: 149 lb (67.6 kg)   Body mass index is 27.25 kg/m.  Wt Readings from Last 3 Encounters:  08/29/16 149 lb (67.6 kg)  07/31/16 149 lb 1.9 oz (67.6 kg)  06/05/16 149 lb (67.6 kg)     Physical Exam Constitutional: Appears well-developed and well-nourished. No distress.  HENT:  Head: Normocephalic and atraumatic.  Neck: Neck supple. No tracheal deviation present. No thyromegaly present.  No cervical  lymphadenopathy Cardiovascular: Normal rate, regular rhythm and normal heart sounds.   No murmur heard. No carotid bruit .  trace edema.  Varicose veins b/l LE Pulmonary/Chest: Effort normal and breath sounds normal. No respiratory distress. No has no wheezes. No rales.  Skin: Skin is warm and dry. Not diaphoretic.  Psychiatric: Normal mood and affect. Behavior is normal.        Assessment & Plan:   See Problem List for Assessment and Plan of chronic medical problems.

## 2016-08-29 NOTE — Patient Instructions (Addendum)
For your bones consider Reclast, Fosamax.    Medications reviewed and updated.  No changes recommended at this time.     Alendronate tablets (Fosamax) - once a week - pill What is this medicine? ALENDRONATE (a LEN droe nate) slows calcium loss from bones. It helps to make normal healthy bone and to slow bone loss in people with Paget's disease and osteoporosis. It may be used in others at risk for bone loss. This medicine may be used for other purposes; ask your health care provider or pharmacist if you have questions. COMMON BRAND NAME(S): Fosamax What should I tell my health care provider before I take this medicine? They need to know if you have any of these conditions: -dental disease -esophagus, stomach, or intestine problems, like acid reflux or GERD -kidney disease -low blood calcium -low vitamin D -problems sitting or standing 30 minutes -trouble swallowing -an unusual or allergic reaction to alendronate, other medicines, foods, dyes, or preservatives -pregnant or trying to get pregnant -breast-feeding How should I use this medicine? You must take this medicine exactly as directed or you will lower the amount of the medicine you absorb into your body or you may cause yourself harm. Take this medicine by mouth first thing in the morning, after you are up for the day. Do not eat or drink anything before you take your medicine. Swallow the tablet with a full glass (6 to 8 fluid ounces) of plain water. Do not take this medicine with any other drink. Do not chew or crush the tablet. After taking this medicine, do not eat breakfast, drink, or take any medicines or vitamins for at least 30 minutes. Sit or stand up for at least 30 minutes after you take this medicine; do not lie down. Do not take your medicine more often than directed. Talk to your pediatrician regarding the use of this medicine in children. Special care may be needed. Overdosage: If you think you have taken too much of  this medicine contact a poison control center or emergency room at once. NOTE: This medicine is only for you. Do not share this medicine with others. What if I miss a dose? If you miss a dose, do not take it later in the day. Continue your normal schedule starting the next morning. Do not take double or extra doses. What may interact with this medicine? -aluminum hydroxide -antacids -aspirin -calcium supplements -drugs for inflammation like ibuprofen, naproxen, and others -iron supplements -magnesium supplements -vitamins with minerals This list may not describe all possible interactions. Give your health care provider a list of all the medicines, herbs, non-prescription drugs, or dietary supplements you use. Also tell them if you smoke, drink alcohol, or use illegal drugs. Some items may interact with your medicine. What should I watch for while using this medicine? Visit your doctor or health care professional for regular checks ups. It may be some time before you see benefit from this medicine. Do not stop taking your medicine except on your doctor's advice. Your doctor or health care professional may order blood tests and other tests to see how you are doing. You should make sure you get enough calcium and vitamin D while you are taking this medicine, unless your doctor tells you not to. Discuss the foods you eat and the vitamins you take with your health care professional. Some people who take this medicine have severe bone, joint, and/or muscle pain. This medicine may also increase your risk for a broken thigh bone. Tell  your doctor right away if you have pain in your upper leg or groin. Tell your doctor if you have any pain that does not go away or that gets worse. This medicine can make you more sensitive to the sun. If you get a rash while taking this medicine, sunlight may cause the rash to get worse. Keep out of the sun. If you cannot avoid being in the sun, wear protective clothing and  use sunscreen. Do not use sun lamps or tanning beds/booths. What side effects may I notice from receiving this medicine? Side effects that you should report to your doctor or health care professional as soon as possible: -allergic reactions like skin rash, itching or hives, swelling of the face, lips, or tongue -black or tarry stools -bone, muscle or joint pain -changes in vision -chest pain -heartburn or stomach pain -jaw pain, especially after dental work -pain or trouble when swallowing -redness, blistering, peeling or loosening of the skin, including inside the mouth Side effects that usually do not require medical attention (report to your doctor or health care professional if they continue or are bothersome): -changes in taste -diarrhea or constipation -eye pain or itching -headache -nausea or vomiting -stomach gas or fullness This list may not describe all possible side effects. Call your doctor for medical advice about side effects. You may report side effects to FDA at 1-800-FDA-1088. Where should I keep my medicine? Keep out of the reach of children. Store at room temperature of 15 and 30 degrees C (59 and 86 degrees F). Throw away any unused medicine after the expiration date. NOTE: This sheet is a summary. It may not cover all possible information. If you have questions about this medicine, talk to your doctor, pharmacist, or health care provider.  2018 Elsevier/Gold Standard (2010-10-20 08:56:09)    Zoledronic Acid injection (Reclast, Osteoporosis) - infusion once/year What is this medicine? ZOLEDRONIC ACID (ZOE le dron ik AS id) lowers the amount of calcium loss from bone. It is used to treat Paget's disease and osteoporosis in women. This medicine may be used for other purposes; ask your health care provider or pharmacist if you have questions. COMMON BRAND NAME(S): Reclast, Zometa What should I tell my health care provider before I take this medicine? They need to  know if you have any of these conditions: -aspirin-sensitive asthma -cancer, especially if you are receiving medicines used to treat cancer -dental disease or wear dentures -infection -kidney disease -low levels of calcium in the blood -past surgery on the parathyroid gland or intestines -receiving corticosteroids like dexamethasone or prednisone -an unusual or allergic reaction to zoledronic acid, other medicines, foods, dyes, or preservatives -pregnant or trying to get pregnant -breast-feeding How should I use this medicine? This medicine is for infusion into a vein. It is given by a health care professional in a hospital or clinic setting. Talk to your pediatrician regarding the use of this medicine in children. This medicine is not approved for use in children. Overdosage: If you think you have taken too much of this medicine contact a poison control center or emergency room at once. NOTE: This medicine is only for you. Do not share this medicine with others. What if I miss a dose? It is important not to miss your dose. Call your doctor or health care professional if you are unable to keep an appointment. What may interact with this medicine? -certain antibiotics given by injection -NSAIDs, medicines for pain and inflammation, like ibuprofen or naproxen -some diuretics  like bumetanide, furosemide -teriparatide This list may not describe all possible interactions. Give your health care provider a list of all the medicines, herbs, non-prescription drugs, or dietary supplements you use. Also tell them if you smoke, drink alcohol, or use illegal drugs. Some items may interact with your medicine. What should I watch for while using this medicine? Visit your doctor or health care professional for regular checkups. It may be some time before you see the benefit from this medicine. Do not stop taking your medicine unless your doctor tells you to. Your doctor may order blood tests or other tests  to see how you are doing. Women should inform their doctor if they wish to become pregnant or think they might be pregnant. There is a potential for serious side effects to an unborn child. Talk to your health care professional or pharmacist for more information. You should make sure that you get enough calcium and vitamin D while you are taking this medicine. Discuss the foods you eat and the vitamins you take with your health care professional. Some people who take this medicine have severe bone, joint, and/or muscle pain. This medicine may also increase your risk for jaw problems or a broken thigh bone. Tell your doctor right away if you have severe pain in your jaw, bones, joints, or muscles. Tell your doctor if you have any pain that does not go away or that gets worse. Tell your dentist and dental surgeon that you are taking this medicine. You should not have major dental surgery while on this medicine. See your dentist to have a dental exam and fix any dental problems before starting this medicine. Take good care of your teeth while on this medicine. Make sure you see your dentist for regular follow-up appointments. What side effects may I notice from receiving this medicine? Side effects that you should report to your doctor or health care professional as soon as possible: -allergic reactions like skin rash, itching or hives, swelling of the face, lips, or tongue -anxiety, confusion, or depression -breathing problems -changes in vision -eye pain -feeling faint or lightheaded, falls -jaw pain, especially after dental work -mouth sores -muscle cramps, stiffness, or weakness -redness, blistering, peeling or loosening of the skin, including inside the mouth -trouble passing urine or change in the amount of urine Side effects that usually do not require medical attention (report to your doctor or health care professional if they continue or are bothersome): -bone, joint, or muscle  pain -constipation -diarrhea -fever -hair loss -irritation at site where injected -loss of appetite -nausea, vomiting -stomach upset -trouble sleeping -trouble swallowing -weak or tired This list may not describe all possible side effects. Call your doctor for medical advice about side effects. You may report side effects to FDA at 1-800-FDA-1088. Where should I keep my medicine? This drug is given in a hospital or clinic and will not be stored at home. NOTE: This sheet is a summary. It may not cover all possible information. If you have questions about this medicine, talk to your doctor, pharmacist, or health care provider.  2018 Elsevier/Gold Standard (2013-09-19 14:19:57)

## 2016-08-29 NOTE — Assessment & Plan Note (Signed)
Reviewed dexa - osteopenia with high FRAX Recommended treatment -discussed options - since she is on prednisone - will avoid prolia Consider fosamax and reclast - she will think about it and let me know - will discuss again in July

## 2016-08-29 NOTE — Assessment & Plan Note (Signed)
BP well controlled Current regimen effective and well tolerated Continue current medications at current doses  

## 2016-08-29 NOTE — Progress Notes (Signed)
Pre visit review using our clinic review tool, if applicable. No additional management support is needed unless otherwise documented below in the visit note. 

## 2016-08-29 NOTE — Assessment & Plan Note (Signed)
Improved with hctz 12.5 mg daily - continue Discussed varicose veins - deferred seeing a vascular surgeon at this time Did not tolerate compression socks Continue low sodium, elevating legs

## 2016-08-31 ENCOUNTER — Encounter: Payer: Self-pay | Admitting: Internal Medicine

## 2016-09-07 ENCOUNTER — Other Ambulatory Visit: Payer: Medicare Other

## 2016-09-07 ENCOUNTER — Ambulatory Visit: Payer: Medicare Other | Admitting: Oncology

## 2016-09-19 NOTE — Progress Notes (Signed)
HPI The patient presents for evaluation of chest discomfort.  Since I last saw her she she has had problems with swelling of her ankles and varicose veins. She's not describing any new cardiovascular symptoms. She was given hydrogen diary which she thinks her blood pressures running too low and she's been in light headed during the day. She's not had any syncope. She's not having any chest pressure, neck or arm discomfort.   Allergies  Allergen Reactions  . Aspirin Other (See Comments)    ITP  . Contrast Media [Iodinated Diagnostic Agents]     hives  . Statins   . Sulfa Antibiotics Other (See Comments)    dizziness    Current Outpatient Prescriptions  Medication Sig Dispense Refill  . Biotin 2500 MCG CAPS Take by mouth daily.    . Calcium-Magnesium-Vitamin D (CITRACAL CALCIUM+D) 600-40-500 MG-MG-UNIT TB24 Take 1 tablet by mouth daily.     . clotrimazole-betamethasone (LOTRISONE) cream Apply 1 application topically 2 (two) times daily as needed. 15 g 1  . Flaxseed MISC by Does not apply route. Take 1 tsp daily    . latanoprost (XALATAN) 0.005 % ophthalmic solution Place 1 drop into both eyes at bedtime.    . Multiple Vitamin (MULTIVITAMIN) tablet Take 1 tablet by mouth daily.      . pantoprazole (PROTONIX) 20 MG tablet TAKE ONE TABLET BY MOUTH ONCE DAILY 90 tablet 2  . polyethylene glycol (MIRALAX / GLYCOLAX) packet Take 17 g by mouth every 3 (three) days.    . predniSONE (DELTASONE) 2.5 MG tablet Take 1 tablet (2.5 mg total) by mouth daily. 90 tablet 3  . Probiotic Product (PROBIOTIC FORMULA PO) Take 1 tablet by mouth daily.      . furosemide (LASIX) 20 MG tablet Take 1 tablet (20 mg total) by mouth as needed. 30 tablet 11   No current facility-administered medications for this visit.     Past Medical History:  Diagnosis Date  . Anxiety   . CAD (coronary artery disease)    RCA 40% stenosis cath 01/2011  . Cholelithiasis    s/p lap chole 09/2014  . Diverticular stricture [facility])  2006   [facility]  . DIVERTICULITIS, HX OF   . Diverticulosis   . DYSLIPIDEMIA   . Elevated LFTs   . GERD   . Glaucoma   . Hepatic steatosis   . HOH (hard of hearing)   . Immune thrombocytopenic purpura (HCC)    chronic - baseline 80-100K, on pred  . Irritable bowel syndrome   . Left ovarian cyst dx 01/2013 CT   working with gyn, ?malignant - elevated tumor marker OVA1  . OSTEOARTHRITIS, KNEE, RIGHT   . OSTEOPENIA   . OVERACTIVE BLADDER   . UNSPECIFIED PERIPHERAL VASCULAR DISEASE   . URINARY INCONTINENCE     Past Surgical History:  Procedure Laterality Date  . ABDOMINAL HYSTERECTOMY  1963  . ANGIOPLASTY    . APPENDECTOMY  1956  . CARDIAC CATHETERIZATION    . CATARACT EXTRACTION, BILATERAL  10/2010  . CHOLECYSTECTOMY N/A 09/16/2014   Procedure: LAPAROSCOPIC CHOLECYSTECTOMY ;  Surgeon: Rolm Bookbinder, MD;  Location: [place];  Service: General;  Laterality: N/A;  . KNEE ARTHROSCOPY Right   . L pop PTA  10/2009   stent  . LAPAROSCOPIC SIGMOID COLECTOMY  10/2005  . SPLENECTOMY  1954  . VARICOSE VEIN SURGERY Right 1962    ROS:   As stated in the HPI and negative for all other systems.  PHYSICAL EXAM BP 134/68   Pulse 88   Ht 5\' 2"  (1.575 m)   Wt 147 lb 9.6 oz (67 kg)   SpO2 98%   BMI 27.00 kg/m   GENERAL:  Well appearing for her age NECK:  No jugular venous distention, waveform within normal limits, carotid upstroke brisk and symmetric, no bruits, no thyromegaly LYMPHATICS:  No cervical, inguinal adenopathy LUNGS:  Clear to auscultation bilaterally BACK:  No CVA tenderness, lordosis. CHEST:  Unremarkable HEART:  PMI not displaced or sustained,S1 and S2 within normal limits, no S3, no S4, no clicks, no rubs, no murmurs ABD:  Flat, positive bowel sounds normal in frequency in pitch, no bruits, no rebound, no guarding, no midline pulsatile mass, no hepatomegaly, no splenomegaly EXT:  2 plus pulses throughout, mild edema, no cyanosis no clubbing,  varicose veins.    ASSESSMENT AND PLAN  HTN - Her blood pressures been running relatively low and she symptomatic with this. Therefore, she should stop hydrodiuril.  EDEMA - She will take Lasix 20 mg x 3 days and then PRN.    DYSLIPIDEMIA -  She does not want to take statins.  No change in therapy is indicated.    CAD - The patient has no new sypmtoms.  No further cardiovascular testing is indicated.  We will continue with aggressive risk reduction and meds as listed.  VARICOSE VEINS - She is going to go back to talk to Dr. Oneida Alar about this as these are painful.   I don't know if there are any therapeutic options.

## 2016-09-20 ENCOUNTER — Encounter: Payer: Self-pay | Admitting: Cardiology

## 2016-09-20 ENCOUNTER — Ambulatory Visit (INDEPENDENT_AMBULATORY_CARE_PROVIDER_SITE_OTHER): Payer: Medicare Other | Admitting: Cardiology

## 2016-09-20 VITALS — BP 134/68 | HR 88 | Ht 62.0 in | Wt 147.6 lb

## 2016-09-20 DIAGNOSIS — I10 Essential (primary) hypertension: Secondary | ICD-10-CM | POA: Diagnosis not present

## 2016-09-20 DIAGNOSIS — I8393 Asymptomatic varicose veins of bilateral lower extremities: Secondary | ICD-10-CM

## 2016-09-20 DIAGNOSIS — M7989 Other specified soft tissue disorders: Secondary | ICD-10-CM

## 2016-09-20 MED ORDER — FUROSEMIDE 20 MG PO TABS: 20.0000 mg | ORAL_TABLET | ORAL | 11 refills | Status: DC | PRN

## 2016-09-20 NOTE — Patient Instructions (Signed)
Medication Instructions:  STOP: HCTz  START; Lasix 20 mg 1 tablets daily for 3 days then take as needed for swelling  Labwork: None Ordered  Testing/Procedures: None Ordered  Follow-Up: Your physician wants you to follow-up in: 6 Months. You will receive a reminder letter in the mail two months in advance. If you don't receive a letter, please call our office to schedule the follow-up appointment.   Any Other Special Instructions Will Be Listed Below (If Applicable).   If you need a refill on your cardiac medications before your next appointment, please call your pharmacy.

## 2016-09-24 ENCOUNTER — Other Ambulatory Visit: Payer: Self-pay

## 2016-09-24 DIAGNOSIS — I739 Peripheral vascular disease, unspecified: Secondary | ICD-10-CM

## 2016-09-25 ENCOUNTER — Telehealth: Payer: Self-pay | Admitting: Cardiology

## 2016-09-25 NOTE — Telephone Encounter (Signed)
Returned call to patient-reports she was instructed to take lasix x 3 days and the PRN, reports she is on her 2nd day and swelling has continued to increase.  States she is not urinating often and swelling in her feet and legs has increased.   Weight 144 this AM, increase of 1 lb overnight.  Reports she monitors her salt intake.  Denies SOB.  Also reports concern with BP 117/58 as this is "low" for her.  Denies dizziness, lightheadedness, but reports she is concerned.  Encouraged patient this is a good BP reading and she is not symptomatic at this time.  Advised patient to take extra 20mg  lasix to help with swelling/edema but patient continues to report concern that this medication is making her swelling worse.  Patient does not want to take medication until further instructed by Dr. Percival Spanish.   Advised to monitor weights, salt intake, and elevate extremities.  Patient aware and verbalized understanding.

## 2016-09-25 NOTE — Telephone Encounter (Signed)
New message    Pt c/o medication issue:  1. Name of Medication: furosemide   2. How are you currently taking this medication (dosage and times per day)? Once daily  3. Are you having a reaction (difficulty breathing--STAT)? no  4. What is your medication issue? Pt states that her bp has dropped some-117/58- and her feet are more swollen.  Pt c/o swelling: STAT is pt has developed SOB within 24 hours  1. How long have you been experiencing swelling? Yesterday and today   2. Where is the swelling located? Ankles and feet  3.  Are you currently taking a "fluid pill"?yes  4.  Are you currently SOB? No  5.  Have you traveled recently? No

## 2016-09-26 NOTE — Telephone Encounter (Signed)
Returned call to patient.She stated she is having swelling in both feet hard to get shoes on.Stated at last visit with Dr.Hochrein he stopped HCTZ 12.5 mg and started her on Lasix 20 mg daily for 3 days then take as needed.Stated B/P was low on HCTZ 115/50.Today B/P 143/63.Stated she felt better taking HCTZ.Spoke to DOD Dr.Harding he advised to take Lasix 20 mg every day.Advised to monitor B/P, call back if B/P low and if she continues to have swelling.

## 2016-09-26 NOTE — Telephone Encounter (Signed)
New Message ° ° pt verbalized that she is returning call for rn °

## 2016-09-27 ENCOUNTER — Telehealth: Payer: Self-pay | Admitting: Oncology

## 2016-09-27 ENCOUNTER — Ambulatory Visit (HOSPITAL_BASED_OUTPATIENT_CLINIC_OR_DEPARTMENT_OTHER): Payer: Medicare Other

## 2016-09-27 ENCOUNTER — Ambulatory Visit (HOSPITAL_BASED_OUTPATIENT_CLINIC_OR_DEPARTMENT_OTHER): Payer: Medicare Other | Admitting: Oncology

## 2016-09-27 VITALS — BP 150/57 | HR 80 | Temp 98.5°F | Resp 18 | Ht 62.0 in | Wt 146.4 lb

## 2016-09-27 DIAGNOSIS — M818 Other osteoporosis without current pathological fracture: Secondary | ICD-10-CM

## 2016-09-27 DIAGNOSIS — D693 Immune thrombocytopenic purpura: Secondary | ICD-10-CM | POA: Diagnosis not present

## 2016-09-27 DIAGNOSIS — N83202 Unspecified ovarian cyst, left side: Secondary | ICD-10-CM

## 2016-09-27 LAB — CBC WITH DIFFERENTIAL/PLATELET
BASO%: 0.4 % (ref 0.0–2.0)
Basophils Absolute: 0 10*3/uL (ref 0.0–0.1)
EOS ABS: 0.1 10*3/uL (ref 0.0–0.5)
EOS%: 1.3 % (ref 0.0–7.0)
HEMATOCRIT: 41.9 % (ref 34.8–46.6)
HEMOGLOBIN: 13.7 g/dL (ref 11.6–15.9)
LYMPH#: 2.7 10*3/uL (ref 0.9–3.3)
LYMPH%: 25.6 % (ref 14.0–49.7)
MCH: 28.2 pg (ref 25.1–34.0)
MCHC: 32.7 g/dL (ref 31.5–36.0)
MCV: 86.2 fL (ref 79.5–101.0)
MONO#: 0.9 10*3/uL (ref 0.1–0.9)
MONO%: 8.2 % (ref 0.0–14.0)
NEUT%: 64.5 % (ref 38.4–76.8)
NEUTROS ABS: 6.8 10*3/uL — AB (ref 1.5–6.5)
Platelets: 164 10*3/uL (ref 145–400)
RBC: 4.86 10*6/uL (ref 3.70–5.45)
RDW: 14 % (ref 11.2–14.5)
WBC: 10.5 10*3/uL — AB (ref 3.9–10.3)

## 2016-09-27 NOTE — Progress Notes (Signed)
Hematology and Oncology Follow Up Visit  Tamara Robertson [id number] [birth year] 83 y.o. 09/27/2016 1:23 PM   Principle Diagnosis: 83 year old with chronic ITP diagnosed in the 75s. She continues to be in remission at this time.  Prior Therapy:  She is S/P splenectomy and subsequently treated with high doses of steroids. The patient have had a complete response to steroids back in the 60s and all the way have had a few relapses. Every time she has a relapse she gets restarted on high-dose of steroids and she achieved a complete response. The most recent of relapses before her move to [facility] she was hospitalized for a platelet count of 8000 around the year 2000.  Current therapy: Prednisone to 2.5 mg every other day. She has been in remission since 2000.   Interim History: Mrs. Sluyter returns today for a follow up visit by himself. Since the last visit, she reports feeling a mild fatigue and tiredness. She has been started on the diuretic initially with hydrochlorothiazide and subsequently Lasix which might have caused her to be more fatigued. Despite these symptoms, she continues to drive and attends to activities of daily living. She still volunteering without any decline in her ability to do so.  She has been diagnosed with osteoporosis and under consideration to start therapy for this issue. She denied any bone pain or pathological fractures previously. She denied any bleeding complications clotting hematochezia or melena. She denied any epistaxis or petechiae.  She has not reported any headaches or blurry vision or double vision. Has not reported any chest pain cough or hemoptysis. Has not reported any nausea or vomiting or abdominal pain. Has not reported any frequency urgency or hematuria. She does not report any skeletal complaints. Rest or view of systems unremarkable.   Medications: I have reviewed the patient's current medications.  Current Outpatient Prescriptions  Medication Sig  Dispense Refill  . Biotin 2500 MCG CAPS Take by mouth daily.    . Calcium-Magnesium-Vitamin D (CITRACAL CALCIUM+D) 600-40-500 MG-MG-UNIT TB24 Take 1 tablet by mouth daily.     . clotrimazole-betamethasone (LOTRISONE) cream Apply 1 application topically 2 (two) times daily as needed. 15 g 1  . Flaxseed MISC by Does not apply route. Take 1 tsp daily    . furosemide (LASIX) 20 MG tablet Take 1 tablet (20 mg total) by mouth daily. 90 tablet 3  . latanoprost (XALATAN) 0.005 % ophthalmic solution Place 1 drop into both eyes at bedtime.    . Multiple Vitamin (MULTIVITAMIN) tablet Take 1 tablet by mouth daily.      . pantoprazole (PROTONIX) 20 MG tablet TAKE ONE TABLET BY MOUTH ONCE DAILY 90 tablet 2  . polyethylene glycol (MIRALAX / GLYCOLAX) packet Take 17 g by mouth every 3 (three) days.    . predniSONE (DELTASONE) 2.5 MG tablet Take 1 tablet (2.5 mg total) by mouth daily. 90 tablet 3  . Probiotic Product (PROBIOTIC FORMULA PO) Take 1 tablet by mouth daily.       No current facility-administered medications for this visit.     Allergies:  Allergies  Allergen Reactions  . Aspirin Other (See Comments)    ITP  . Sulfa Antibiotics Other (See Comments)    dizziness  . Contrast Media [Iodinated Diagnostic Agents] Hives  . Statins Other (See Comments)    Past Medical History, Surgical history, Social history, and Family History were reviewed and updated.  Physical Exam: Blood pressure (!) 150/57, pulse 80, temperature 98.5 F (36.9 C), temperature source Oral,  resp. rate 18, height 5\' 2"  (1.575 m), weight 146 lb 6.4 oz (66.4 kg), SpO2 100 %. ECOG: 1 General appearance: Alert, awake woman without distress. Head: Normocephalic, without obvious abnormality no oral ulcers or lesions. Neck: no adenopathy.  Lymph nodes: Cervical, supraclavicular, and axillary nodes normal. Heart:regular rate and rhythm, S1, S2 normal, no murmur, click, rub or gallop Lung:chest clear, no wheezing, rales, normal  symmetric air entry Abdomin: soft, non-tender, without masses or organomegaly no shifting dullness or ascites. EXT:no erythema, induration, or nodules Neurological: No deficits noted. Skin: No rashes or lesions.    Lab Results: Lab Results  Component Value Date   WBC 10.5 (H) 09/27/2016   HGB 13.7 09/27/2016   HCT 41.9 09/27/2016   MCV 86.2 09/27/2016   PLT 164 09/27/2016     Chemistry      Component Value Date/Time   NA 139 07/31/2016 1653   NA 142 02/16/2016 1526   K 4.1 07/31/2016 1653   K 4.3 02/16/2016 1526   CL 103 07/31/2016 1653   CL 105 04/17/2012 1452   CO2 29 07/31/2016 1653   CO2 27 02/16/2016 1526   BUN 16 07/31/2016 1653   BUN 14.3 02/16/2016 1526   CREATININE 0.75 07/31/2016 1653   CREATININE 0.8 02/16/2016 1526      Component Value Date/Time   CALCIUM 9.5 07/31/2016 1653   CALCIUM 9.5 02/16/2016 1526   ALKPHOS 86 07/31/2016 1653   ALKPHOS 92 02/16/2016 1526   AST 19 07/31/2016 1653   AST 19 02/16/2016 1526   ALT 12 07/31/2016 1653   ALT 11 02/16/2016 1526   BILITOT 0.3 07/31/2016 1653   BILITOT 0.33 02/16/2016 1526      Impression and Plan:   83 year old woman with the following issues:  1. Chronic relapsing immune thrombocytopenia: He is status post multiple therapies in the past but have been in remission since your 2000.   Her laboratory data were reviewed today from today and platelet count continues to be within normal range. The plan is to continue with the same dose and schedule of prednisone.  2. Osteoporosis: Could be exacerbated from her long term steroid use. I encouraged her to consider osteoporosis therapy at this time. She is reluctant to add any additional medication at this time and would like to defer that option to a later date. I told her that I have no objections either way at this time. She understands she might be at increased risk for pathological fractures in the future.  3. Follow-up: Will be in 6  months.  Eli Hose, MD 5/24/20181:23 PM

## 2016-09-27 NOTE — Telephone Encounter (Signed)
Gave patient AVS and calender per 5/24 los  

## 2016-09-28 ENCOUNTER — Encounter: Payer: Self-pay | Admitting: Cardiology

## 2016-09-29 ENCOUNTER — Telehealth: Payer: Self-pay | Admitting: Cardiology

## 2016-09-29 NOTE — Telephone Encounter (Signed)
Pt now on lasix but has developed a rash, first on her chest now on face.  She has taken benadryl, which I instructed she could take every 6 hours but may make her drowsy and she should not drive.  If swelling or SOB she should go to ER .  She still needs diuretic and will send to Dr. Percival Spanish to get his recommendation.  She will call if further problems and no more lasix.

## 2016-09-29 NOTE — Telephone Encounter (Signed)
We need to sort this out in the office.  I need to understand if this was a true reaction to Lasix.  Schedule follow up with me.

## 2016-10-02 NOTE — Telephone Encounter (Signed)
Called patient to [facility] Thursday May 31 appt. She cannot make any appts this week until Friday afternoon. Dr. Percival Spanish has a 24 hour acute slot open on 6/1 @ 1:40pm.   Patient states her swelling is better, rash is gone after taking benadryl. She states swelling was even better on diuretic but she stopped lasix per Mickel Baas, NP. She states she can make an appointment early next week as well.   Deferred to MD/CMA to see if OK to use this appt slot.

## 2016-10-03 NOTE — Telephone Encounter (Signed)
Spoke with Tamara Robertson letting her know that Dr Percival Spanish will not be able to switch lasix because he think if you are have a rash break out she will have it will other diuretic, Tamara Robertson stated that she is willing to try the lasix again, because she don't know if her break out is from her lasix and if she have the rash she will call us and let us know.

## 2016-10-15 DIAGNOSIS — H0015 Chalazion left lower eyelid: Secondary | ICD-10-CM | POA: Diagnosis not present

## 2016-10-19 ENCOUNTER — Encounter: Payer: Self-pay | Admitting: Vascular Surgery

## 2016-10-31 ENCOUNTER — Ambulatory Visit (HOSPITAL_COMMUNITY)
Admission: RE | Admit: 2016-10-31 | Discharge: 2016-10-31 | Disposition: A | Discharge status: Home / Self Care | Payer: Medicare Other | Source: Ambulatory Visit | Attending: Vascular Surgery | Admitting: Vascular Surgery

## 2016-10-31 DIAGNOSIS — I739 Peripheral vascular disease, unspecified: Secondary | ICD-10-CM | POA: Diagnosis not present

## 2016-10-31 LAB — VAS US LOWER EXTREMITY ARTERIAL DUPLEX
LATIBDISTSYS: 136 cm/s
LPERODISTSYS: 83 cm/s
LSFDPSV: -113 cm/s
LSFPPSV: 106 cm/s
Left super femoral mid sys PSV: -82 cm/s
RIGHT ANT DIST TIBAL SYS PSV: 18 cm/s
RTIBDISTSYS: -124 cm/s
Right super femoral dist sys PSV: -119 cm/s
Right super femoral mid sys PSV: -120 cm/s
Right super femoral prox sys PSV: 102 cm/s

## 2016-11-01 ENCOUNTER — Ambulatory Visit (INDEPENDENT_AMBULATORY_CARE_PROVIDER_SITE_OTHER): Payer: Medicare Other | Admitting: Vascular Surgery

## 2016-11-01 ENCOUNTER — Encounter: Payer: Self-pay | Admitting: Vascular Surgery

## 2016-11-01 VITALS — BP 175/80 | HR 88 | Temp 97.4°F | Resp 18 | Ht 62.0 in | Wt 146.0 lb

## 2016-11-01 DIAGNOSIS — I83813 Varicose veins of bilateral lower extremities with pain: Secondary | ICD-10-CM

## 2016-11-01 NOTE — Progress Notes (Signed)
Patient is an 83 year old female who returns for follow-up today for bilateral lower extremity leg pain. She primarily complains of swelling in her lower extremities from the knee down. The left ankle area is the worst portion. She has previously had a right leg vein stripping and a laser ablation. She's had no prior interventions in the left leg. She does complain of some calf pain after walking 5-10 minutes but this is not really consistent and does not really somewhat claudication. She denies rest pain. She has no nonhealing wounds. She has developed skin changes and staining of both lower extremities over time. I previously recommended compression stockings to her. However due to her age and lack of strength she is not really able to apply these easily. She has difficulty getting them on or taking them off.  Review of systems: She has shortness of breath with exertion. She occasionally has some dizziness. She denies chest pain.   Physical exam:  Vitals:   11/01/16 1054 11/01/16 1055  BP: (!) 172/79 (!) 175/80  Pulse: 88   Resp: 18   Temp: 97.4 F (36.3 C)   TempSrc: Oral   SpO2: 95%   Weight: 146 lb (66.2 kg)   Height: 5\' 2"  (1.575 m)     Extremities: One plus dorsalis pedis pulses bilaterally absent posterior tibial pulses  Skin: No rash, hemosiderin staining bilateral gaiter area, scattered varicosities left greater than right area in the left ankle with 3-4 mm diameter varicosities  Musculoskeletal: Trace edema bilaterally  Data: Patient had an arterial duplex exam which really showed no significant flow-limiting stenosis although she may have some mild tibial artery occlusive disease  Assessment: Most likely patient's symptoms are related to venous reflux disease. She has evidence of varicose veins with pain and swelling bilaterally. Due to her age and overall deconditioning she has difficulty placing compression stockings. She has mild atherosclerosis of low both lower extremities  but I do not believe this will ever require any intervention as her ABIs are normal and her duplex exam was fairly unremarkable.  Plan: The patient was instructed today on how to place Ace wrap several lower extremity so hopefully we can get some compression on her and increase her compliance since she is unable to get the stockings on. I also believe should she has significant varicose veins with pain and swelling that she would benefit from Normodyne compression boots. We will try to get these approved for her.  She will follow-up with [place] in 6 weeks' time to see if she is getting some improvement with compression. At that point we will also do a venous reflux exam to determine the extent of her venous disease.  Ruta Hinds, MD [facility] Office: [phone] Pager: [phone]

## 2016-11-13 DIAGNOSIS — H04122 Dry eye syndrome of left lacrimal gland: Secondary | ICD-10-CM | POA: Diagnosis not present

## 2016-11-14 NOTE — Addendum Note (Signed)
Addended by: Lianne Cure A on: 11/14/2016 10:48 AM   Modules accepted: Orders

## 2016-11-15 ENCOUNTER — Telehealth: Payer: Self-pay | Admitting: *Deleted

## 2016-11-15 NOTE — Telephone Encounter (Signed)
Patient returned call regarding her scan appt. Informed the patient of the [place] scan on September 4th at 2pm; also gave the patient a follow up appt with Dr. Alycia Rossetti on September 12th at 1:15pm. Patient told arrive to [place] scan with a full bladder

## 2016-11-27 ENCOUNTER — Encounter: Payer: Self-pay | Admitting: Vascular Surgery

## 2016-12-04 ENCOUNTER — Other Ambulatory Visit (INDEPENDENT_AMBULATORY_CARE_PROVIDER_SITE_OTHER): Payer: Medicare Other

## 2016-12-04 ENCOUNTER — Encounter: Payer: Self-pay | Admitting: Internal Medicine

## 2016-12-04 ENCOUNTER — Ambulatory Visit (INDEPENDENT_AMBULATORY_CARE_PROVIDER_SITE_OTHER): Payer: Medicare Other | Admitting: Internal Medicine

## 2016-12-04 VITALS — BP 154/74 | HR 76 | Temp 97.8°F | Resp 16 | Wt 144.0 lb

## 2016-12-04 DIAGNOSIS — Z0000 Encounter for general adult medical examination without abnormal findings: Secondary | ICD-10-CM

## 2016-12-04 LAB — CBC WITH DIFFERENTIAL/PLATELET
Basophils Absolute: 0.1 10*3/uL (ref 0.0–0.1)
Basophils Relative: 0.6 % (ref 0.0–3.0)
EOS PCT: 2.9 % (ref 0.0–5.0)
Eosinophils Absolute: 0.3 10*3/uL (ref 0.0–0.7)
HEMATOCRIT: 42.5 % (ref 36.0–46.0)
Hemoglobin: 13.9 g/dL (ref 12.0–15.0)
LYMPHS PCT: 42 % (ref 12.0–46.0)
Lymphs Abs: 4.6 10*3/uL — ABNORMAL HIGH (ref 0.7–4.0)
MCHC: 32.7 g/dL (ref 30.0–36.0)
MCV: 85.5 fl (ref 78.0–100.0)
MONOS PCT: 9.9 % (ref 3.0–12.0)
Monocytes Absolute: 1.1 10*3/uL — ABNORMAL HIGH (ref 0.1–1.0)
NEUTROS ABS: 4.8 10*3/uL (ref 1.4–7.7)
Neutrophils Relative %: 44.6 % (ref 43.0–77.0)
PLATELETS: 175 10*3/uL (ref 150.0–400.0)
RBC: 4.97 Mil/uL (ref 3.87–5.11)
RDW: 13.7 % (ref 11.5–15.5)
WBC: 10.8 10*3/uL — ABNORMAL HIGH (ref 4.0–10.5)

## 2016-12-04 LAB — COMPREHENSIVE METABOLIC PANEL
ALK PHOS: 74 U/L (ref 39–117)
ALT: 12 U/L (ref 0–35)
AST: 19 U/L (ref 0–37)
Albumin: 4.3 g/dL (ref 3.5–5.2)
BILIRUBIN TOTAL: 0.5 mg/dL (ref 0.2–1.2)
BUN: 15 mg/dL (ref 6–23)
CALCIUM: 9.4 mg/dL (ref 8.4–10.5)
CO2: 30 mEq/L (ref 19–32)
CREATININE: 0.88 mg/dL (ref 0.40–1.20)
Chloride: 102 mEq/L (ref 96–112)
GFR: 64.87 mL/min (ref >60.00)
Glucose, Bld: 106 mg/dL — ABNORMAL HIGH (ref 70–99)
POTASSIUM: 4.4 meq/L (ref 3.5–5.1)
Sodium: 139 mEq/L (ref 135–145)
TOTAL PROTEIN: 7.5 g/dL (ref 6.0–8.3)

## 2016-12-04 LAB — TSH: TSH: 3.07 u[IU]/mL (ref 0.35–4.50)

## 2016-12-04 LAB — HEMOGLOBIN A1C: HEMOGLOBIN A1C: 5.9 % (ref 4.6–6.5)

## 2016-12-04 NOTE — Progress Notes (Signed)
Pre visit review using our clinic review tool, if applicable. No additional management support is needed unless otherwise documented below in the visit note. 

## 2016-12-04 NOTE — Patient Instructions (Addendum)
      Test(s) ordered today. Your results will be released to [facility] (or called to you) after review, usually within 72hours after test completion. If any changes need to be made, you will be notified at that same time.  All other Health Maintenance issues reviewed.   All recommended immunizations and age-appropriate screenings are up-to-date or discussed.  No immunizations administered today.  Consider the new shingles vaccine.   Medications reviewed and updated.  No changes recommended at this time.  Your prescription(s) have been submitted to your pharmacy. Please take as directed and contact our office if you believe you are having problem(s) with the medication(s).   Please followup in 6 months  Continue doing brain stimulating activities (puzzles, reading, adult coloring books, staying active) to keep memory sharp.   Continue to eat heart healthy diet (full of fruits, vegetables, whole grains, lean protein, water--limit salt, fat, and sugar intake) and increase physical activity as tolerated.   Tamara Robertson , Thank you for taking time to come for your Medicare Wellness Visit. I appreciate your ongoing commitment to your health goals. Please review the following plan we discussed and let me know if I can assist you in the future.   These are the goals we discussed: Goals    . Stay as active and as independent as possible       This is a list of the screening recommended for you and due dates:  Health Maintenance  Topic Date Due  . Flu Shot  12/05/2016  . DEXA scan (bone density measurement)  08/23/2019  . Tetanus Vaccine  02/27/2021  . Pneumonia vaccines  Completed

## 2016-12-04 NOTE — Progress Notes (Signed)
Subjective:   Tamara Robertson is a 83 y.o. female who presents for Medicare Annual (Subsequent) preventive examination.  Review of Systems:  No ROS.  Medicare Wellness Visit. Additional risk factors are reflected in the social history.  Cardiac Risk Factors include: advanced age (>62men, >38 women);dyslipidemia;hypertension  Sleep patterns: gets up 1 times nightly to void and sleeps 6 hours nightly.    Home Safety/Smoke Alarms: Feels safe in home. Smoke alarms in place.  Living environment; residence and Firearm Safety: 1-story house/ trailer, no firearms. Lives with alone, no needs for DME Seat Belt Safety/Bike Helmet: Wears seat belt.   Counseling:   Eye Exam- appointment yearly Dr. Franne Grip Dental- appointment every 6 months,   Female:   Pap- N/A     Mammo- Last 07/16/16,  BI-RADS category 1: negative       Dexa scan- Last 08/22/16      CCS- N/A     Objective:     Vitals: BP (!) 154/74   Pulse 76   Temp 97.8 F (36.6 C) (Oral)   Resp 16   Wt 144 lb (65.3 kg)   SpO2 98%   BMI 26.34 kg/m   Body mass index is 26.34 kg/m.   Tobacco History  Smoking Status  . Never Smoker  Smokeless Tobacco  . Never Used     Counseling given: Not Answered   Past Medical History:  Diagnosis Date  . Anxiety   . CAD (coronary artery disease)    RCA 40% stenosis cath 01/2011  . Cholelithiasis    s/p lap chole 09/2014  . Diverticular stricture [facility]) 2006   [facility]  . DIVERTICULITIS, HX OF   . Diverticulosis   . DYSLIPIDEMIA   . Elevated LFTs   . GERD   . Glaucoma   . Hepatic steatosis   . HOH (hard of hearing)   . Immune thrombocytopenic purpura (HCC)    chronic - baseline 80-100K, on pred  . Irritable bowel syndrome   . Left ovarian cyst dx 01/2013 CT   working with gyn, ?malignant - elevated tumor marker OVA1  . OSTEOARTHRITIS, KNEE, RIGHT   . OSTEOPENIA   . OVERACTIVE BLADDER   . UNSPECIFIED PERIPHERAL VASCULAR DISEASE   . URINARY INCONTINENCE     Past Surgical History:  Procedure Laterality Date  . ABDOMINAL HYSTERECTOMY  1963  . ANGIOPLASTY    . APPENDECTOMY  1956  . CARDIAC CATHETERIZATION    . CATARACT EXTRACTION, BILATERAL  10/2010  . CHOLECYSTECTOMY N/A 09/16/2014   Procedure: LAPAROSCOPIC CHOLECYSTECTOMY ;  Surgeon: Rolm Bookbinder, MD;  Location: [place];  Service: General;  Laterality: N/A;  . KNEE ARTHROSCOPY Right   . L pop PTA  10/2009   stent  . LAPAROSCOPIC SIGMOID COLECTOMY  10/2005  . SPLENECTOMY  1954  . VARICOSE VEIN SURGERY Right 1962   Family History  Problem Relation Age of Onset  . Coronary artery disease Mother   . Heart attack Mother 77  . Hyperlipidemia Mother   . Hypertension Mother   . Stomach cancer Father   . Hypertension Daughter   . Hyperlipidemia Daughter   . Arthritis Unknown        parent  . Transient ischemic attack Unknown        parent  . Colon cancer Neg Hx    History  Sexual Activity  . Sexual activity: Not on file    Outpatient Encounter Prescriptions as of 12/04/2016  Medication Sig  . Biotin  [place] Take by mouth daily.  . Calcium-Magnesium-Vitamin D (CITRACAL CALCIUM+D) 600-40-500 MG-MG-UNIT TB24 Take 1 tablet by mouth daily.   . clotrimazole-betamethasone (LOTRISONE) cream Apply 1 application topically 2 (two) times daily as needed.  . Flaxseed MISC by Does not apply route. Take 1 tsp daily  . furosemide (LASIX) 20 MG tablet Take 1 tablet (20 mg total) by mouth daily.  [name] latanoprost (XALATAN) 0.005 % ophthalmic solution Place 1 drop into both eyes at bedtime.  . Multiple Vitamin (MULTIVITAMIN) tablet Take 1 tablet by mouth daily.    . pantoprazole (PROTONIX) 20 MG tablet TAKE ONE TABLET BY MOUTH ONCE DAILY  . polyethylene glycol (MIRALAX / GLYCOLAX) packet Take 17 g by mouth every 3 (three) days.  . predniSONE (DELTASONE) 2.5 MG tablet Take 1 tablet (2.5 mg total) by mouth daily.  . Probiotic Product (PROBIOTIC FORMULA PO) Take 1 tablet by mouth daily.     No  facility-administered encounter medications on file as of 12/04/2016.     Activities of Daily Living In your present state of health, do you have any difficulty performing the following activities: 12/04/2016  Hearing? Y  Vision? N  Difficulty concentrating or making decisions? N  Walking or climbing stairs? N  Dressing or bathing? N  Doing errands, shopping? N  Preparing Food and eating ? N  Using the Toilet? N  In the past six months, have you accidently leaked urine? N  Do you have problems with loss of bowel control? N  Managing your Medications? N  Managing your Finances? N  Housekeeping or managing your Housekeeping? N  Some recent data might be hidden    Patient Care Team: Binnie Rail, MD as PCP - General (Internal Medicine) Darleen Crocker, MD as Consulting Physician (Ophthalmology) Almedia Balls, MD as Consulting Physician (Orthopedic Surgery) [name], MD as Consulting Physician (Cardiology) Rolm Bookbinder, MD as Consulting Physician (Dermatology) Wyatt Portela, MD (Hematology and Oncology) Princess Bruins, MD (Obstetrics and Gynecology) Melissa Montane, MD (Otolaryngology) Tiajuana Amass, MD (Allergy and Immunology) Pyrtle, Lajuan Lines, MD (Gastroenterology) Nancy Marus, MD (Gynecologic Oncology) Philemon Kingdom, MD (Endocrinology) Rolm Bookbinder, MD (General Surgery) Pyrtle, Lajuan Lines, MD as Consulting Physician (Gastroenterology)    Assessment:    Physical assessment deferred to PCP.  Exercise Activities and Dietary recommendations Current Exercise Habits: Home exercise routine, Type of exercise: walking, Time (Minutes): 20, Frequency (Times/Week): 5, Weekly Exercise (Minutes/Week): 100, Intensity: Mild, Exercise limited by: None identified  Diet (meal preparation, eat out, water intake, caffeinated beverages, dairy products, fruits and vegetables): in general, a "healthy" diet  , well balanced, eats a variety of fruits and vegetables daily, limits salt,  fat/cholesterol, sugar, caffeine, drinks 6-8 glasses of water daily.  Goals    . Stay as active and as independent as possible      Fall Risk Fall Risk  12/04/2016 12/01/2015 03/16/2015 11/22/2014 03/26/2014  Falls in the past year? No No Yes No Yes  Number falls in past yr: - - 1 - 1  Injury with Fall? - - Yes - -  Risk Factor Category  - - High Fall Risk - -  Risk for fall due to : - - - - Impaired balance/gait  Follow up - - Education provided - -   Depression Screen PHQ 2/9 Scores 12/04/2016 12/01/2015 11/22/2014 03/26/2014  PHQ - 2 Score 1 0 0 0  PHQ- 9 Score 3 - - -     Cognitive Function MMSE - Mini Mental State  Exam 12/04/2016  Orientation to time 5  Orientation to Place 5  Registration 3  Attention/ Calculation 5  Recall 1  Language- name 2 objects 2  Language- repeat 1  Language- follow 3 step command 3  Language- read & follow direction 1  Write a sentence 1  Copy design 1  Total score 28        Immunization History  Administered Date(s) Administered  . Influenza Split 02/04/2014, 02/14/2015  . Influenza-Unspecified 02/12/2013, 02/06/2016  . Pneumococcal Conjugate-13 08/24/2014  . Pneumococcal Polysaccharide-23 02/28/2011  . Td 02/28/2011  . Zoster 04/07/2007   Screening Tests Health Maintenance  Topic Date Due  . INFLUENZA VACCINE  12/05/2016  . DEXA SCAN  08/23/2019  . TETANUS/TDAP  02/27/2021  . PNA vac Low Risk Adult  Completed      Plan:  Continue doing brain stimulating activities (puzzles, reading, adult coloring books, staying active) to keep memory sharp.   Continue to eat heart healthy diet (full of fruits, vegetables, whole grains, lean protein, water--limit salt, fat, and sugar intake) and increase physical activity as tolerated.  [facility] SW referral for assistance in locating independent living facility   I have personally reviewed and noted the following in the patient's chart:   . Medical and social history . Use of alcohol, tobacco or  illicit drugs  . Current medications and supplements . Functional ability and status . Nutritional status . Physical activity . Advanced directives . List of other physicians . Vitals . Screenings to include cognitive, depression, and falls . Referrals and appointments  In addition, I have reviewed and discussed with patient certain preventive protocols, quality metrics, and best practice recommendations. A written personalized care plan for preventive services as well as general preventive health recommendations were provided to patient.     Michiel Cowboy, RN  12/04/2016    Medical screening examination/treatment/procedure(s) were performed by non-physician practitioner and as supervising physician I was immediately available for consultation/collaboration. I agree with above. Binnie Rail, MD

## 2016-12-04 NOTE — Progress Notes (Signed)
Subjective:    Patient ID: Tamara Robertson, female    DOB: Jul 07, 1929, 83 y.o.   MRN: [id number]  HPI She is here for a physical exam.   She still has leg swelling.  Will see dr fields for her varicose veins. Dr hochrein changed the hctz to lasix and that has helped.  Her swelling is much better.    She had diarrhea all day yesterday.  She went to a picnic two days ago and thinks it must be from that.  The diarrhea stopped yesterday and she has not had any since.  She has diarrhea 1-2 times a week - usually the third bowel movement is diarrhea.    Left hip pain:  Her left hip is ok.  She is back to doing yoga.   She is eating fairly well.     Medications and allergies reviewed with patient and updated if appropriate.  Patient Active Problem List   Diagnosis Date Noted  . Varicose veins of both lower extremities 09/20/2016  . Hypertension 07/31/2016  . Leg edema 07/31/2016  . Prediabetes 06/05/2016  . Leg pain, anterior 01/15/2016  . Osteopenia 12/01/2015  . Anxiety 07/19/2015  . Degenerative cervical disc 12/28/2014  . Left ovarian cyst   . Peripheral neuropathy 06/03/2012  . Insomnia   . PVD (peripheral vascular disease) (HCC) 11/23/2010  . Bursitis of hip 10/26/2010  . Hyperlipidemia 06/27/2010  . IMMUNE THROMBOCYTOPENIC PURPURA 06/27/2010  . GLUCOMA 06/27/2010  . GERD 06/27/2010  . IRRITABLE BOWEL SYNDROME 06/27/2010  . OVERACTIVE BLADDER 06/27/2010  . OSTEOARTHRITIS, KNEE, RIGHT 06/27/2010  . URINARY INCONTINENCE 06/27/2010    Current Outpatient Prescriptions on File Prior to Visit  Medication Sig Dispense Refill  . Biotin 2500 MCG CAPS Take by mouth daily.    . Calcium-Magnesium-Vitamin D (CITRACAL CALCIUM+D) 600-40-500 MG-MG-UNIT TB24 Take 1 tablet by mouth daily.     . clotrimazole-betamethasone (LOTRISONE) cream Apply 1 application topically 2 (two) times daily as needed. 15 g 1  . Flaxseed MISC by Does not apply route. Take 1 tsp daily    . furosemide (LASIX)  20 MG tablet Take 1 tablet (20 mg total) by mouth daily. 90 tablet 3  . latanoprost (XALATAN) 0.005 % ophthalmic solution Place 1 drop into both eyes at bedtime.    . Multiple Vitamin (MULTIVITAMIN) tablet Take 1 tablet by mouth daily.      . pantoprazole (PROTONIX) 20 MG tablet TAKE ONE TABLET BY MOUTH ONCE DAILY 90 tablet 2  . polyethylene glycol (MIRALAX / GLYCOLAX) packet Take 17 g by mouth every 3 (three) days.    . predniSONE (DELTASONE) 2.5 MG tablet Take 1 tablet (2.5 mg total) by mouth daily. 90 tablet 3  . Probiotic Product (PROBIOTIC FORMULA PO) Take 1 tablet by mouth daily.       No current facility-administered medications on file prior to visit.     Past Medical History:  Diagnosis Date  . Anxiety   . CAD (coronary artery disease)    RCA 40% stenosis cath 01/2011  . Cholelithiasis    s/p lap chole 09/2014  . Diverticular stricture [facility]) 2006   [facility]  . DIVERTICULITIS, HX OF   . Diverticulosis   . DYSLIPIDEMIA   . Elevated LFTs   . GERD   . Glaucoma   . Hepatic steatosis   . HOH (hard of hearing)   . Immune thrombocytopenic purpura (HCC)    chronic - baseline 80-100K, on pred  .  Irritable bowel syndrome   . Left ovarian cyst dx 01/2013 CT   working with gyn, ?malignant - elevated tumor marker OVA1  . OSTEOARTHRITIS, KNEE, RIGHT   . OSTEOPENIA   . OVERACTIVE BLADDER   . UNSPECIFIED PERIPHERAL VASCULAR DISEASE   . URINARY INCONTINENCE     Past Surgical History:  Procedure Laterality Date  . ABDOMINAL HYSTERECTOMY  1963  . ANGIOPLASTY    . APPENDECTOMY  1956  . CARDIAC CATHETERIZATION    . CATARACT EXTRACTION, BILATERAL  10/2010  . CHOLECYSTECTOMY N/A 09/16/2014   Procedure: LAPAROSCOPIC CHOLECYSTECTOMY ;  Surgeon: Emelia Loron, MD;  Location: [facility] OR;  Service: General;  Laterality: N/A;  . KNEE ARTHROSCOPY Right   . L pop PTA  10/2009   stent  . LAPAROSCOPIC SIGMOID COLECTOMY  10/2005  . SPLENECTOMY  1954  . VARICOSE VEIN SURGERY  Right 1962    Social History   Social History  . Marital status: Widowed    Spouse name: N/A  . Number of children: N/A  . Years of education: N/A   Occupational History  . Retired     Programmer, multimedia   Social History Main Topics  . Smoking status: Never Smoker  . Smokeless tobacco: Never Used  . Alcohol use No     Comment: rarely  . Drug use: No  . Sexual activity: Not on file   Other Topics Concern  . 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    [place] to [place] from [place] 05/2010 to be close to kids    Family History  Problem Relation Age of Onset  . Coronary artery disease Mother   . Heart attack Mother 83  . Hyperlipidemia Mother   . Hypertension Mother   . Stomach cancer Father   . Hypertension Daughter   . Hyperlipidemia Daughter   . Arthritis Unknown        parent  . Transient ischemic attack Unknown        parent  . Colon cancer Neg Hx     Review of Systems  Constitutional: Negative for appetite change, chills, fatigue, fever and unexpected weight change.  Eyes: Negative for visual disturbance.  Respiratory: Negative for cough, shortness of breath and wheezing.   Cardiovascular: Positive for leg swelling. Negative for chest pain and palpitations.  Gastrointestinal: Positive for diarrhea (1-2 a week - ? stress related). Negative for abdominal pain and blood in stool.  Genitourinary: Negative for dysuria and hematuria.  Musculoskeletal: Positive for arthralgias (left hip).  Skin: Positive for color change (mole on abdomen). Negative for rash.  Neurological: Positive for light-headedness (occ - mostly in morning). Negative for headaches.  Psychiatric/Behavioral: Negative for dysphoric mood. The patient is nervous/anxious.        Objective:   Vitals:   12/04/16 1419  BP: (!) 154/74  Pulse: 76  Resp: 16  Temp: 97.8 F (36.6 C)   Filed Weights   12/04/16 1419  Weight: 144 lb  (65.3 kg)   Body mass index is 26.34 kg/m.  Wt Readings from Last 3 Encounters:  12/04/16 144 lb (65.3 kg)  11/01/16 146 lb (66.2 kg)  09/27/16 146 lb 6.4 oz (66.4 kg)     Physical Exam Constitutional: She appears well-developed and well-nourished. No distress.  HENT:  Head: Normocephalic and atraumatic.  Right Ear: External ear normal. Normal ear canal with scant cerumen and normal TM Left Ear: External ear  normal.  Normal ear canal with scant cerumen and normal TM Mouth/Throat: Oropharynx is clear and moist.  Eyes: Conjunctivae and EOM are normal.  Neck: Neck supple. No tracheal deviation present. No thyromegaly present.  No carotid bruit  Cardiovascular: Normal rate, regular rhythm and normal heart sounds.   No murmur heard. trace b/l LE edema. Varicose veins b/l le  Pulmonary/Chest: Effort normal and breath sounds normal. No respiratory distress. She has no wheezes. She has no rales.  Breast: deferred   Abdominal: Soft. She exhibits no distension. There is no tenderness.  Lymphadenopathy: She has no cervical adenopathy.  Skin: Skin is warm and dry. She is not diaphoretic. large seborrheic keratosis on abdomen - normal appearing. Psychiatric: She has a normal mood and affect. Her behavior is normal.       Assessment & Plan:   Physical exam: Screening blood work  ordered - will hold off on lipid  Immunizations  Discussed shingrix, others up to date Colonoscopy - no longer needed due to age Mammogram- no longer needed due to age Dexa - Up to date   Eye exams  Up to date  Exercise  Doing yoga - will continue Weight  BMI good for age Skin one mole of concerns - normal,reassured; sees derm annually Substance abuse  none  See Problem List for Assessment and Plan of chronic medical problems.

## 2016-12-06 ENCOUNTER — Encounter: Payer: Self-pay | Admitting: Internal Medicine

## 2016-12-07 ENCOUNTER — Other Ambulatory Visit: Payer: Self-pay

## 2016-12-07 NOTE — Patient Outreach (Signed)
[facility]) Care Management  12/07/2016  EMIYA LOOMER [birth year] [id number]   Telephone Screen  Referral Date: 12/06/16 Referral Source: MD office(Dr. Quay Burow) Referral Reason: "SW assistance for independent living advice and resources" Insurance: [facility] Medicare     Return call placed to patient per previous call request. Spoke with patient and screening completed.   Social: Patient resides in her home alone. She is independent with ADLS/IADLs. She voices that she does have a paid housekeeper that comes in to clean her home for her. Patient states that her dtr lives locally and she has a son that lives in [place]. She voices that they both still work and have busy lives and would not be able to provide care to her if she ever needed additional assistance in the home. She reports she is trying to plan for the days when she will no longer be independent. She has looked into one ALF[facility] several of her church friends reside but it was too expensive for her. She is interested in finding out other ALF options and well as possible in home assistance options for future needs.She also want sot find senior resources that will allow her to be interactive with other people and to "feel involved" in something. She states that other than church she does not socialize with others. Patient drives herself to MD appts. She denies any falls in the home and does not use any assistive devices.   Conditions: Per chart review she has PMH of Anxiety, CAD, HLD, GERD, Glaucoma, HOH, IBS, hepatic steatosis, immune thrombocytopenia purpura an ovarian cyst. Patient voices that she is able to manage her medical conditions and seems knowledgeable regarding her medical care.   Medications: Patient reports taking 11 meds(three prescription meds and multiple OTC supplements/vitamins). She denies any issues affording and/or managing her meds.   Appointments Patient saw PCP on 12/04/16. She has  appt with vascular (Dr. Oneida Alar) on next week. She is also followed by cardiologist(Dr. Jenkins Rouge) and heme/oncologist(Dr. Alen Blew).   Advance Directives: Patient states she has living will and [facility] POA. She has not provided medical team with copy of documents and patient instructed to do so as soon as possible. She voices understanding and stets she will take it to her next MD appt.  Consent: [facility] services reviewed and discussed. Patient gave verbal consent for services. Patient has no RN CM needs or concerns at this time.  Plan: RN CM will notify [facility] administrative assistant of case status. RN CM will send [facility] SW referral for independent living and in home support planning and options.   Enzo Montgomery, RN,BSN,CCM [place] Management Telephonic Care Management Coordinator Direct Phone: [phone] Toll Free: [phone] Fax: [phone]

## 2016-12-07 NOTE — Patient Outreach (Signed)
[facility]) Care Management  12/07/2016  Tamara Robertson [birth year] [id number]   Telephone Screen  Referral Date: 12/06/16 Referral Source: MD office(Dr. Quay Burow) Referral Reason: "SW assistance for independent living advice and resources" Insurance: [facility] Medicare   Outreach attempt # 1 to patient. Spoke with patient who reported she was eating breakfast and requested call back later.       Plan: RN CM will make outreach attempt to patient within three business days.    Enzo Montgomery, RN,BSN,CCM [place] Management Telephonic Care Management Coordinator Direct Phone: [phone] Toll Free: [phone] Fax: [phone]

## 2016-12-10 ENCOUNTER — Encounter: Payer: Self-pay | Admitting: *Deleted

## 2016-12-10 ENCOUNTER — Other Ambulatory Visit: Payer: Self-pay | Admitting: *Deleted

## 2016-12-10 NOTE — Patient Outreach (Signed)
[facility]) Care Management  12/10/2016  ASHYIA SCHRAEDER 04-07-1930 [id number]   CSW made an initial attempt to try and contact patient today to perform phone assessment, as well as assess and assist with social needs and services, without success.  A HIPAA compliant message was left for patient on voicemail.  CSW is currently awaiting a return call. CSW will make a second outreach attempt within the next week, if CSW does not receive a return call from patient in the meantime. [name], BSW, MSW, LCSW  Licensed Education officer, environmental Health System  Mailing [place] [address] [place], [place] [address] Physical Address-[address], [place], [place] [address] Toll Free Main # [phone] Fax # [phone] Cell # [phone]  Office # [phone] [email or web address]

## 2016-12-11 ENCOUNTER — Other Ambulatory Visit: Payer: Self-pay | Admitting: *Deleted

## 2016-12-11 ENCOUNTER — Encounter: Payer: Self-pay | Admitting: *Deleted

## 2016-12-11 NOTE — Patient Outreach (Signed)
[facility]) Care Management  12/11/2016  Tamara Robertson [birth year] [id number]    CSW was able to make initial contact with patient today to perform phone assessment, as well as assess and assist with social work needs and services.  CSW introduced self, explained role and types of services provided through [place] [facility] ([place] [facility]).  CSW further explained to patient that CSW works with patient's Telephonic RNCM, also with [place] Management, Tamara Robertson. CSW then explained the reason for the call, indicating that Mrs. Robertson thought that patient would benefit from social work services and resources to assist with level of care issues and planning for her future.  CSW obtained two HIPAA compliant identifiers from patient, which included patient's name and date of birth. Patient admits that she lives alone and has very limited support.  Patient is still able to drive, grocery shop, and perform all activities of daily living independently.  Patient is just trying to be proactive and plan for her future, wanting to talk with CSW about all the various options for long-term care services.  CSW spoke with patient at length about in-home care, through a home health agency and/or private agency sitter, versus placement into an assisted living facility for long-term care services.  Patient was agreeable to having CSW mail her the following list of resources: [place] Management Consent for Financial risk analyst List of [place] List of Assisted Living Facilities in [place] then agreed to follow-up with patient in one week to ensure that she received the packet of resource information, as well as to answer any questions that she may have at that time.  Patient voiced understanding and was agreeable to this  plan. Tamara Robertson, BSW, MSW, LCSW  Licensed Education officer, environmental Health System  Mailing [place] [address] [place], [place] [address] Physical Address-[address], [place], [place] [address] Toll Free Main # [phone] Fax # [phone] Cell # [phone]  Office # [phone] [email or web address]

## 2016-12-11 NOTE — Patient Outreach (Signed)
Request received from [name] to mail patient personal care resources.  Information mailed today.

## 2016-12-13 ENCOUNTER — Encounter: Payer: Self-pay | Admitting: Vascular Surgery

## 2016-12-13 ENCOUNTER — Ambulatory Visit: Payer: Self-pay | Admitting: *Deleted

## 2016-12-13 ENCOUNTER — Ambulatory Visit (INDEPENDENT_AMBULATORY_CARE_PROVIDER_SITE_OTHER): Payer: Medicare Other | Admitting: Vascular Surgery

## 2016-12-13 ENCOUNTER — Ambulatory Visit (HOSPITAL_COMMUNITY)
Admission: RE | Admit: 2016-12-13 | Discharge: 2016-12-13 | Disposition: A | Discharge status: Home / Self Care | Payer: Medicare Other | Source: Ambulatory Visit | Attending: Vascular Surgery | Admitting: Vascular Surgery

## 2016-12-13 VITALS — BP 175/73 | HR 75 | Temp 97.1°F | Resp 20 | Ht 62.0 in | Wt 144.0 lb

## 2016-12-13 DIAGNOSIS — I83813 Varicose veins of bilateral lower extremities with pain: Secondary | ICD-10-CM

## 2016-12-13 DIAGNOSIS — R609 Edema, unspecified: Secondary | ICD-10-CM | POA: Diagnosis present

## 2016-12-13 NOTE — Progress Notes (Signed)
HISTORY AND PHYSICAL     CC:  Follow up for lower extremity swelling Requesting Provider:  Binnie Rail, MD  HPI: This is a 83 y.o. female who comes in today for follow up for BLE swelling.  She states that she has been given a prescription for lasix for her swelling.  She states this has helped.   She is only able to take half of the pill as it lowers her blood pressure.  She states that she does have 20-41mmHg compression stockings but does not wear them every day.  She was unable to use the ace wrap.  She does elevate her legs during the day, which have helped.  She denies any bleeding from her varicosities.  She does continue to have pain and achy feeling of the BLE with the left > right.    She states that she has not been able to do a lot of walking lately bc she is not a morning person and has to walk in the evenings and it has been raining and humid.  She states that when she does walk, she walks for about 10 minutes and the bottom of her legs hurt.  After she rests, she can proceed and can go about 5 minutes before she has to rest again.   She does have ITP and states that her platelets are improved.   Past Medical History:  Diagnosis Date  . Anxiety   . CAD (coronary artery disease)    RCA 40% stenosis cath 01/2011  . Cholelithiasis    s/p lap chole 09/2014  . Diverticular stricture [facility]) 2006   [facility]  . DIVERTICULITIS, HX OF   . Diverticulosis   . DYSLIPIDEMIA   . Elevated LFTs   . GERD   . Glaucoma   . Hepatic steatosis   . HOH (hard of hearing)   . Immune thrombocytopenic purpura (HCC)    chronic - baseline 80-100K, on pred  . Irritable bowel syndrome   . Left ovarian cyst dx 01/2013 CT   working with gyn, ?malignant - elevated tumor marker OVA1  . OSTEOARTHRITIS, KNEE, RIGHT   . OSTEOPENIA   . OVERACTIVE BLADDER   . UNSPECIFIED PERIPHERAL VASCULAR DISEASE   . URINARY INCONTINENCE     Past Surgical History:  Procedure Laterality Date    . ABDOMINAL HYSTERECTOMY  1963  . ANGIOPLASTY    . APPENDECTOMY  1956  . CARDIAC CATHETERIZATION    . CATARACT EXTRACTION, BILATERAL  10/2010  . CHOLECYSTECTOMY N/A 09/16/2014   Procedure: LAPAROSCOPIC CHOLECYSTECTOMY ;  Surgeon: Rolm Bookbinder, MD;  Location: [facility];  Service: General;  Laterality: N/A;  . KNEE ARTHROSCOPY Right   . L pop PTA  10/2009   stent  . LAPAROSCOPIC SIGMOID COLECTOMY  10/2005  . SPLENECTOMY  1954  . VARICOSE VEIN SURGERY Right 1962    Allergies  Allergen Reactions  . Aspirin Other (See Comments)    ITP  . Sulfa Antibiotics Other (See Comments)    dizziness  . Contrast Media [Iodinated Diagnostic Agents] Hives  . Lasix [Furosemide] Rash  . Statins Other (See Comments)    Current Outpatient Prescriptions  Medication Sig Dispense Refill  . Biotin 2500 MCG CAPS Take by mouth daily.    . Calcium-Magnesium-Vitamin D (CITRACAL CALCIUM+D) 600-40-500 MG-MG-UNIT TB24 Take 1 tablet by mouth daily.     . clotrimazole-betamethasone (LOTRISONE) cream Apply 1 application topically 2 (two) times daily as needed. 15 g 1  . Flaxseed  MISC by Does not apply route. Take 1 tsp daily    . furosemide (LASIX) 20 MG tablet Take 1 tablet (20 mg total) by mouth daily. 90 tablet 3  . latanoprost (XALATAN) 0.005 % ophthalmic solution Place 1 drop into both eyes at bedtime.    . Multiple Vitamin (MULTIVITAMIN) tablet Take 1 tablet by mouth daily.      . pantoprazole (PROTONIX) 20 MG tablet TAKE ONE TABLET BY MOUTH ONCE DAILY 90 tablet 2  . polyethylene glycol (MIRALAX / GLYCOLAX) packet Take 17 g by mouth every 3 (three) days.    . predniSONE (DELTASONE) 2.5 MG tablet Take 1 tablet (2.5 mg total) by mouth daily. 90 tablet 3  . Probiotic Product (PROBIOTIC FORMULA PO) Take 1 tablet by mouth daily.       No current facility-administered medications for this visit.     Family History  Problem Relation Age of Onset  . Coronary artery disease Mother   . Heart attack Mother 47  .  Hyperlipidemia Mother   . Hypertension Mother   . Stomach cancer Father   . Hypertension Daughter   . Hyperlipidemia Daughter   . Arthritis Unknown        parent  . Transient ischemic attack Unknown        parent  . Colon cancer Neg Hx     Social History   Social History  . Marital status: Widowed    Spouse name: N/A  . Number of children: N/A  . Years of education: N/A   Occupational History  . Retired     Retail banker   Social History Main Topics  . Smoking status: Never Smoker  . Smokeless tobacco: Never Used  . Alcohol use No     Comment: rarely  . Drug use: No  . Sexual activity: Not on file   Other Topics Concern  . 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    Dorie Rank to [place] from [place] 05/2010 to be close to kids     REVIEW OF SYSTEMS:   [X]  denotes positive finding, [ ]  denotes negative finding Cardiac  Comments:  Chest pain or chest pressure:    Shortness of breath upon exertion:    Short of breath when lying flat:    Irregular heart rhythm:        Vascular    Pain in calf, thigh, or hip brought on by ambulation: x   Pain in feet at night that wakes you up from your sleep:     Blood clot in your veins:    Leg swelling:  x       Pulmonary    Oxygen at home:    Productive cough:     Wheezing:         Neurologic    Sudden weakness in arms or legs:     Sudden numbness in arms or legs:     Sudden onset of difficulty speaking or slurred speech:    Temporary loss of vision in one eye:     Problems with dizziness:         Gastrointestinal    Blood in stool:     Vomited blood:         Genitourinary    Burning when urinating:     Blood in urine:        Psychiatric    Major depression:  Hematologic    Bleeding problems:    Problems with blood clotting too easily:        Skin    Rashes or ulcers:        Constitutional    Fever or chills:      PHYSICAL  EXAMINATION:  Vitals:   12/13/16 1207 12/13/16 1209  BP: (!) 174/81 (!) 175/73  Pulse: 75   Resp: 20   Temp: (!) 97.1 F (36.2 C)   SpO2: 99%    Vitals:   12/13/16 1207  Weight: 144 lb (65.3 kg)  Height: 5\' 2"  (1.575 m)   Body mass index is 26.34 kg/m.  General:  WDWN in NAD; vital signs documented above Gait: Normal HENT: WNL, normocephalic Pulmonary: normal non-labored breathing , without Rales, rhonchi,  wheezing Cardiac: regular HR, without  Murmurs without carotid bruits Abdomen: soft, NT, no masses Skin: without rashes Vascular Exam/Pulses:  Right Left  Radial 2+ (normal) 2+ (normal)  Ulnar 1+ (weak) 1+ (weak)  DP Unable to palpate  Unable to palpate   PT Unable to palpate  Unable to palpate    Extremities:  Left lower leg (ankle)  Left medial thigh above the knee  Left lower leg  Musculoskeletal: no muscle wasting or atrophy  Neurologic: A&O X 3;  No focal weakness or paresthesias are detected Psychiatric:  The pt has Normal affect.   Non-Invasive Vascular Imaging:   Lower extremity venous duplex reflux evaluation 12/13/16: 1.  No evidence of DVT 2.  Left SSV appears chronically occluded 3.  Venous incompetence of >58ms noted in the right anterior accessory vein and popliteal vein. 4.  Venous incompetence of >528ms noted in the left great saphenous vein  -Vein measurements left GSV:  0.45cm-0.79cm -right GSV has been stripped.   Pt meds includes: Statin:  No. Beta Blocker:  No. Aspirin:  No. ACEI:  No. ARB:  No. CCB use:  No Other Antiplatelet/Anticoagulant:  No   ASSESSMENT/PLAN:: 83 y.o. female with BLE leg swelling with the left > right   -pt duplex today reveals venous incompetence of >598ms in the left great saphenous vein.  Her swelling is somewhat improved with lasix, but she continues to have swelling and achy feeling of her left leg. -she is advised to start wearing thigh high compression stockings of 20-41mmHg  daily.  She  will continue leg elevation.   -she is instructed to hold pressure and elevate leg if she does have some bleeding from the varicosities. -she will f/u in 3 months with Dr. Donnetta Hutching or Dr. Kellie Simmering for consideration of left leg laser ablation.   Leontine Locket, PA-C [facility] [phone]  Clinic MD:  Pt seen and examined with Dr. Oneida Alar  History and exam findings as above. Patient has symptomatic varicose veins with pain primarily left lower extremity. She will continue to wear her compression stockings. She will follow-up with possible laser ablation in 3 months. She is CEAP classification 4.   Ruta Hinds, MD [facility] Office: [phone] Pager: [phone]

## 2016-12-14 ENCOUNTER — Telehealth: Payer: Self-pay | Admitting: *Deleted

## 2016-12-14 DIAGNOSIS — H04123 Dry eye syndrome of bilateral lacrimal glands: Secondary | ICD-10-CM | POA: Diagnosis not present

## 2016-12-14 DIAGNOSIS — H401431 Capsular glaucoma with pseudoexfoliation of lens, bilateral, mild stage: Secondary | ICD-10-CM | POA: Diagnosis not present

## 2016-12-14 NOTE — Telephone Encounter (Signed)
Called and spoke to York Grice, rep for [employer]. I explained to her that patient is wearing compression stockings and elevation and will follow up at our office in 3 months.

## 2016-12-17 ENCOUNTER — Encounter: Payer: Self-pay | Admitting: *Deleted

## 2016-12-20 ENCOUNTER — Other Ambulatory Visit: Payer: Self-pay | Admitting: *Deleted

## 2016-12-20 NOTE — Patient Outreach (Signed)
Request received from patient, she received previously requested resources and asked for additional information.  Information mailed today.

## 2016-12-20 NOTE — Patient Outreach (Signed)
[facility] Care Management  12/20/2016  Tamara Robertson [birth year] [id number]   CSW was able to make contact with patient today to follow-up regarding social work services and resources, as well as to ensure that patient received the packet of resource information mailed to her home by CSW.  Patient admitted that she did receive the packet and that she has found the information most helpful, especially the list of home health agencies/private agency sitters.  Patient reported that she has been reviewing the list of assisted living facilities, but not quite feeling that she is ready for that level of care, requesting information and resources pertaining to independent senior living.  CSW agreed to make contact with Tamara Robertson, Care Management Assistant with [facility], to request that she mail patient information about senior living housing options. Patient was most appreciative, denying the need for any additional resources at this time.  CSW agreed to follow-up with patient in one week, to not only ensure that she received the information, but also to answer any questions that she may have at that time.  Patient voiced understanding and was agreeable to this plan. [name], BSW, MSW, LCSW  Licensed Education officer, environmental Health System  Mailing [place] [address], [place], [place] [address] Physical Address-[address], [place], [place] [address] Toll Free Main # [phone] Fax # [phone] Cell # [phone]  Office # [phone] [email or web address]

## 2016-12-25 ENCOUNTER — Telehealth: Payer: Self-pay | Admitting: Oncology

## 2016-12-25 NOTE — Telephone Encounter (Signed)
Spoke to patient regarding appointment 12/12.

## 2016-12-27 ENCOUNTER — Other Ambulatory Visit: Payer: Self-pay | Admitting: *Deleted

## 2016-12-27 ENCOUNTER — Encounter: Payer: Self-pay | Admitting: *Deleted

## 2016-12-27 NOTE — Patient Outreach (Signed)
[facility]) Care Management  12/27/2016  KERRI-ANNE HAEBERLE May 16, 1929 [id number]   CSW was able to make contact with patient today to follow-up regarding social work services and resources, as well as ensure that patient received the additional packet of resource information mailed to her home by CSW.  Patient confirmed that she received the packet and has been so pleased with the information enclosed that she is sharing her knowledge and resources with her neighbor, also interested in assisted living placement in the near future.  Patient reported that she is not actually ready to pursue placement at this time; however, she feels as though she has all the information she needs to be successful once she begins her search.  Patient was unable to identify any additional social work needs at present. CSW will perform a case closure on patient, as all goals of treatment have been met from social work standpoint and no additional social work needs have been identified at this time.  CSW will fax an update to patient's Primary Care Physician, Dr. Billey Gosling to ensure that they are aware of CSW's involvement with patient's plan of care.  CSW will submit a case closure request to Verlon Setting, Care Management Assistant with [facility], in the form of an [employer].   [name], BSW, MSW, LCSW  Licensed Education officer, environmental Health System  Mailing [place] [address], [place], [place] [address] Physical Address-[address], [place], [place] [address] Toll Free Main # [phone] Fax # [phone] Cell # [phone]  Office # [phone] [email or web address]

## 2016-12-29 ENCOUNTER — Other Ambulatory Visit: Payer: Self-pay | Admitting: Internal Medicine

## 2017-01-01 DIAGNOSIS — H401132 Primary open-angle glaucoma, bilateral, moderate stage: Secondary | ICD-10-CM | POA: Diagnosis not present

## 2017-01-08 ENCOUNTER — Ambulatory Visit (HOSPITAL_COMMUNITY)
Admission: RE | Admit: 2017-01-08 | Discharge: 2017-01-08 | Disposition: A | Discharge status: Home / Self Care | Payer: Medicare Other | Source: Ambulatory Visit | Attending: Gynecologic Oncology | Admitting: Gynecologic Oncology

## 2017-01-08 DIAGNOSIS — N83202 Unspecified ovarian cyst, left side: Secondary | ICD-10-CM | POA: Diagnosis present

## 2017-01-08 DIAGNOSIS — D693 Immune thrombocytopenic purpura: Secondary | ICD-10-CM

## 2017-01-16 ENCOUNTER — Other Ambulatory Visit: Payer: Self-pay | Admitting: Gynecologic Oncology

## 2017-01-16 ENCOUNTER — Encounter: Payer: Self-pay | Admitting: Gynecologic Oncology

## 2017-01-16 ENCOUNTER — Ambulatory Visit: Payer: Medicare Other | Attending: Gynecologic Oncology | Admitting: Gynecologic Oncology

## 2017-01-16 VITALS — BP 156/53 | HR 75 | Temp 97.7°F | Resp 20 | Wt 146.2 lb

## 2017-01-16 DIAGNOSIS — Z79899 Other long term (current) drug therapy: Secondary | ICD-10-CM | POA: Diagnosis not present

## 2017-01-16 DIAGNOSIS — E785 Hyperlipidemia, unspecified: Secondary | ICD-10-CM | POA: Insufficient documentation

## 2017-01-16 DIAGNOSIS — M8580 Other specified disorders of bone density and structure, unspecified site: Secondary | ICD-10-CM | POA: Diagnosis not present

## 2017-01-16 DIAGNOSIS — F419 Anxiety disorder, unspecified: Secondary | ICD-10-CM | POA: Diagnosis not present

## 2017-01-16 DIAGNOSIS — M1711 Unilateral primary osteoarthritis, right knee: Secondary | ICD-10-CM | POA: Insufficient documentation

## 2017-01-16 DIAGNOSIS — Z90710 Acquired absence of both cervix and uterus: Secondary | ICD-10-CM

## 2017-01-16 DIAGNOSIS — I2510 Atherosclerotic heart disease of native coronary artery without angina pectoris: Secondary | ICD-10-CM | POA: Insufficient documentation

## 2017-01-16 DIAGNOSIS — K219 Gastro-esophageal reflux disease without esophagitis: Secondary | ICD-10-CM | POA: Insufficient documentation

## 2017-01-16 DIAGNOSIS — I739 Peripheral vascular disease, unspecified: Secondary | ICD-10-CM | POA: Diagnosis not present

## 2017-01-16 DIAGNOSIS — K580 Irritable bowel syndrome with diarrhea: Secondary | ICD-10-CM | POA: Insufficient documentation

## 2017-01-16 DIAGNOSIS — Z90721 Acquired absence of ovaries, unilateral: Secondary | ICD-10-CM | POA: Diagnosis not present

## 2017-01-16 DIAGNOSIS — Z7952 Long term (current) use of systemic steroids: Secondary | ICD-10-CM | POA: Diagnosis not present

## 2017-01-16 DIAGNOSIS — R1900 Intra-abdominal and pelvic swelling, mass and lump, unspecified site: Secondary | ICD-10-CM

## 2017-01-16 DIAGNOSIS — N3281 Overactive bladder: Secondary | ICD-10-CM | POA: Insufficient documentation

## 2017-01-16 DIAGNOSIS — N83202 Unspecified ovarian cyst, left side: Secondary | ICD-10-CM | POA: Diagnosis present

## 2017-01-16 DIAGNOSIS — Z9081 Acquired absence of spleen: Secondary | ICD-10-CM | POA: Diagnosis not present

## 2017-01-16 DIAGNOSIS — R1031 Right lower quadrant pain: Secondary | ICD-10-CM | POA: Diagnosis not present

## 2017-01-16 DIAGNOSIS — D693 Immune thrombocytopenic purpura: Secondary | ICD-10-CM | POA: Diagnosis not present

## 2017-01-16 DIAGNOSIS — Z9049 Acquired absence of other specified parts of digestive tract: Secondary | ICD-10-CM | POA: Insufficient documentation

## 2017-01-16 NOTE — Progress Notes (Signed)
Consult Note: Gyn-Onc  Tamara Robertson 83 y.o. female  CC:  Chief Complaint  Patient presents with  . Left ovarian cyst    HPI: Patient is an 83 year old gravida 3 para 2 aborta 1 who noticed a change in her bowels and that she has a narrow bowel movements about every 3 weeks and then her bowels have returned to normal. She was also experiencing some right lower quadrant pain. The patient states she has a history of spastic colon and IBS. However she cannot tolerate bowel prep for colonoscopy she underwent a CT scan of the abdomen and pelvis which revealed:  CT ABDOMEN AND PELVIS WITH CONTRAST   Lung bases are clear.  8 mm hypervascular lesion in the inferior right hepatic lobe (series 2/ image 35), likely reflecting a flash filling hemangioma. Prior splenectomy. Pancreas and adrenal glands are within normal limits. Gallbladder is unremarkable. No intrahepatic or extrahepatic ductal dilatation. Right renal cysts, including a 2.7 cm anterior upper pole cyst (series 5/ image 16) and a 1.5 cm posterior lower pole cyst (series 5/ image 7). Left kidney is within normal limits. No hydronephrosis. No evidence of bowel obstruction. Appendix is not discretely visualized and may be surgically absent. Prior sigmoid resection with anastomosis in the left pelvis (series 2/image 68). Atherosclerotic calcifications of the abdominal aorta and branch vessels.  No abdominopelvic ascites.  No suspicious abdominopelvic lymphadenopathy.  Status post hysterectomy. 2.8 cm left ovarian cyst (series 2/ image 61). No right adnexal mass. Mild prominence of the mid right ureter (series 2/image 53), although smoothly tapering distally, favored to reflect physiologic laxity.  Bladder is within normal limits. Degenerative changes of the visualized thoracolumbar spine.  IMPRESSION:  No evidence of bowel obstruction.  Prior splenectomy, sigmoidectomy, and hysterectomy. Suspected prior appendectomy. No CT findings to account for  the patient's right lower quadrant abdominal pain. 8 mm hypervascular lesion in the inferior right hepatic lobe, likely reflecting a flash filling hemangioma. 2.8 cm left ovarian cyst. Given postmenopausal status, follow-up pelvic ultrasound is suggested in 1 year.  The CT findings prompted an ultrasound and Dr. Assunta Curtis office. This was performed on October 14. 2 small cysts were noted within the region of the left adnexa. There was a 2.9 x 2.7 x 2.0 cm cyst with soft tissue nodule measuring 1.3 x 0.6 x 1.1 cm within the cyst. There's a separate small cysts measuring 1.8 x 1.8 x 1.5 cm simple in appearance. There is no increased vascularity noted. A CA 125 was performed that was normal at 23. However, an OVA-1 test was performed that was slightly elevated at 4.7 with the upper limit of normal for a menopausal woman being 4.4.  Ultrasound 05/25/13: Left ovary  Measurements: 3.5 x 2.4 x 3.3 cm. Two cyst within the left ovary measuring 2.2 x 1.4 x 2.0 cm and 2.3 x 2.3 x 2.5 cm. These either represent 2 adjacent simple cyst with the measurements as above or 1  larger cyst measuring up to 3.5 cm with a thin internal septation. Other findings No free fluid.  IMPRESSION:  Either two adjacent simple cysts or one larger cyst with a thin internal septation as described above. This is almost certainly benign, but follow up ultrasound is recommended in 1 year according to the Society of Radiologists in [place] Statement (D Clovis Riley et al. Management of Asymptomatic Ovarian and Other Adnexal Cysts Imaged at [place]: Society of Radiologists in [place] Statement 2010. Radiology 256 (Sept 2010): 384-665.).  A  CA 125 was also drawn that was normal at 13.8. This is down from 21 on 02/17/2013. She comes in today for followup. She is very pleased with the results of the ultrasound as well as the blood work. She's been completely asymptomatic and has no complaints related to  this.  Interval History: I last saw her in March 2017. At that time the cyst was really quite stable in size and she comes in today for follow-up.  Measurements: A cystic lesion with a thin internal septation and mild wall irregularity is again seen in the left adnexa. This measures 4.1 x 2.9 by 3.6 cm, and is not significantly changed in size or appearance. This has indeterminate but probably benign characteristics. No abnormal free fluid.  IMPRESSION: 4.1 cm indeterminate but probably benign cystic lesion in left adnexa, without significant change compared to previous studies dating back to 04/06/2015. Differential diagnosis includes cystic ovarian neoplasm, hydrosalpinx, and peritoneal inclusion cyst. Suggest correlation with serum tumor markers, and consider continued yearly followup by ultrasound versus surgical evaluation in a postmenopausal female.  She is very pleased with the results. She states she only has discomfort about once every 3 months and is usually if she has some constipation or firmer stool. She describes as a dull pain that goes away fairly quickly. Occasionally if she is carrying something heavy on her abdomen she'll notice some discomfort or if she bumps her abdomen against a counter she'll noticed that but again is fairly infrequent. She occasionally has some diarrhea which she feels like she is having some stressors running late for an appointment. She feels like she is not as good a couple She was that she's not cooking as much and had some issues baking for her church pot lock. Otherwise cognitively she seems very intact. There are no new medical problems and her family. She is requesting a GYN exam today   Review of Systems: Constitutional: Denies fever. Skin: No rash Cardiovascular: No chest pain, shortness of breath, slight edema in her left ankle Pulmonary: No cough Gastro Intestinal:  No nausea, vomiting, constipation, or diarrhea reported. No bright red blood per  rectum or change in bowel movement other than as above.  Genitourinary: Denies vaginal bleeding and discharge.  Neurologic: No weakness, numbness, or change in gait.  Psychology: Sad husband passed away, staying busy.  Current Meds:  Outpatient Encounter Prescriptions as of 01/16/2017  Medication Sig  . Biotin 2500 MCG CAPS Take by mouth daily.  . Calcium-Magnesium-Vitamin D (CITRACAL CALCIUM+D) 600-40-500 MG-MG-UNIT TB24 Take 1 tablet by mouth daily.   . clotrimazole-betamethasone (LOTRISONE) cream Apply 1 application topically 2 (two) times daily as needed.  . Flaxseed MISC by Does not apply route. Take 1 tsp daily  . furosemide (LASIX) 20 MG tablet Take 1 tablet (20 mg total) by mouth daily.  [name] latanoprost (XALATAN) 0.005 % ophthalmic solution Place 1 drop into both eyes at bedtime.  . Multiple Vitamin (MULTIVITAMIN) tablet Take 1 tablet by mouth daily.    . pantoprazole (PROTONIX) 20 MG tablet TAKE ONE TABLET BY MOUTH ONCE DAILY  . polyethylene glycol (MIRALAX / GLYCOLAX) packet Take 17 g by mouth every 3 (three) days.  . predniSONE (DELTASONE) 2.5 MG tablet TAKE ONE TABLET BY MOUTH ONCE DAILY  . Probiotic Product (PROBIOTIC FORMULA PO) Take 1 tablet by mouth daily.     No facility-administered encounter medications on file as of 01/16/2017.     Allergy:  Allergies  Allergen Reactions  . Aspirin Other (See  Comments)    ITP  . Sulfa Antibiotics Other (See Comments)    dizziness  . Contrast Media [Iodinated Diagnostic Agents] Hives  . Lasix [Furosemide] Rash  . Statins Other (See Comments)    Social Hx:   Social History   Social History  . Marital status: Widowed    Spouse name: N/A  . Number of children: N/A  . Years of education: N/A   Occupational History  . Retired     Retail banker   Social History Main Topics  . Smoking status: Never Smoker  . Smokeless tobacco: Never Used  . Alcohol use No     Comment: rarely  . Drug use: No  . Sexual activity:  Not on file   Other Topics Concern  . 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    Dorie Rank to [place] from [place] 05/2010 to be close to kids    Past Surgical Hx:  Past Surgical History:  Procedure Laterality Date  . ABDOMINAL HYSTERECTOMY  1963  . ANGIOPLASTY    . APPENDECTOMY  1956  . CARDIAC CATHETERIZATION    . CATARACT EXTRACTION, BILATERAL  10/2010  . CHOLECYSTECTOMY N/A 09/16/2014   Procedure: LAPAROSCOPIC CHOLECYSTECTOMY ;  Surgeon: Rolm Bookbinder, MD;  Location: [facility];  Service: General;  Laterality: N/A;  . KNEE ARTHROSCOPY Right   . L pop PTA  10/2009   stent  . LAPAROSCOPIC SIGMOID COLECTOMY  10/2005  . SPLENECTOMY  1954  . VARICOSE VEIN SURGERY Right 1962    Past Medical Hx:  Past Medical History:  Diagnosis Date  . Anxiety   . CAD (coronary artery disease)    RCA 40% stenosis cath 01/2011  . Cholelithiasis    s/p lap chole 09/2014  . Diverticular stricture [facility]) 2006   [facility]  . DIVERTICULITIS, HX OF   . Diverticulosis   . DYSLIPIDEMIA   . Elevated LFTs   . GERD   . Glaucoma   . Hepatic steatosis   . HOH (hard of hearing)   . Immune thrombocytopenic purpura (HCC)    chronic - baseline 80-100K, on pred  . Irritable bowel syndrome   . Left ovarian cyst dx 01/2013 CT   working with gyn, ?malignant - elevated tumor marker OVA1  . OSTEOARTHRITIS, KNEE, RIGHT   . OSTEOPENIA   . OVERACTIVE BLADDER   . UNSPECIFIED PERIPHERAL VASCULAR DISEASE   . URINARY INCONTINENCE     Oncology Hx:   No history exists.    Family Hx:  Family History  Problem Relation Age of Onset  . Coronary artery disease Mother   . Heart attack Mother 75  . Hyperlipidemia Mother   . Hypertension Mother   . Stomach cancer Father   . Hypertension Daughter   . Hyperlipidemia Daughter   . Arthritis Unknown        parent  . Transient ischemic attack Unknown        parent  . Colon cancer Neg Hx      Vitals:  Blood pressure (!) 156/53, pulse 75, temperature 97.7 F (36.5 C), temperature source Oral, resp. rate 20, weight 146 lb 3.2 oz (66.3 kg), SpO2 100 %.  Physical Exam: Well-nourished well-developed female in no acute distress.  Neck: Supple, no lymphadenopathy, no thyromegaly.  Lungs: Clear to auscultation bilaterally.  Cardiac: Regular rate and rhythm.  Breasts symmetrical. There are no palpable masses. No skin changes  other than several seborrheic keratoses throughout. There is no nipple retraction or nipple discharge. There is no axillary adenopathy.  Abdomen: Well-healed vertical midline incision. Abdomen is soft. It is tender to deep palpation in the right lower quadrant but she believes this is secondary to her bowels. There is no rebound or guarding. There is no hepatosplenomegaly. There is no fluid wave  Groins: No lymphadenopathy.  Extremities: Trace edema bilaterally.  Pelvic: External genitalia normal. Bimanual examination: There are no adnexal masses appreciated. There is no nodularity.   Assessment/Plan: 83 year old gravida 3 para 2 status post hysterectomy and right salpingo-oophorectomy at the age of 79 or 39 for advanced endometriosis. She currently has a complex left adnexal mass measuring about 3 cm. CA 125 was normal at 13, however, OVA-1 was slightly elevated at 4.7 with the normal reference range of less than 4.4 and a postmenopausal woman.  At this point as it is not appear that she's been a dramatic increase in the size of the cyst with close follow-up since 2016. This time she is comfortable with returning in one year for follow-up ultrasound. Signs and symptoms of ovarian cancer reviewed with the patient. She'll contact us if she has any change in her abdominal or pelvic symptoms.   Lorain Keast A., MD 01/16/2017, 2:54 PM

## 2017-01-16 NOTE — Patient Instructions (Signed)
Please call us in June 2019 so we can schedule your ultrasound and visit for September 2019.

## 2017-01-24 DIAGNOSIS — D1801 Hemangioma of skin and subcutaneous tissue: Secondary | ICD-10-CM | POA: Diagnosis not present

## 2017-01-24 DIAGNOSIS — L304 Erythema intertrigo: Secondary | ICD-10-CM | POA: Diagnosis not present

## 2017-01-24 DIAGNOSIS — L821 Other seborrheic keratosis: Secondary | ICD-10-CM | POA: Diagnosis not present

## 2017-01-24 DIAGNOSIS — L820 Inflamed seborrheic keratosis: Secondary | ICD-10-CM | POA: Diagnosis not present

## 2017-02-01 DIAGNOSIS — H6123 Impacted cerumen, bilateral: Secondary | ICD-10-CM | POA: Diagnosis not present

## 2017-02-01 DIAGNOSIS — Z974 Presence of external hearing-aid: Secondary | ICD-10-CM | POA: Diagnosis not present

## 2017-02-01 DIAGNOSIS — H938X3 Other specified disorders of ear, bilateral: Secondary | ICD-10-CM | POA: Diagnosis not present

## 2017-02-01 DIAGNOSIS — H9193 Unspecified hearing loss, bilateral: Secondary | ICD-10-CM | POA: Insufficient documentation

## 2017-02-01 DIAGNOSIS — H903 Sensorineural hearing loss, bilateral: Secondary | ICD-10-CM | POA: Diagnosis not present

## 2017-02-01 DIAGNOSIS — L299 Pruritus, unspecified: Secondary | ICD-10-CM | POA: Diagnosis not present

## 2017-02-08 ENCOUNTER — Telehealth: Payer: Self-pay | Admitting: Internal Medicine

## 2017-02-08 NOTE — Telephone Encounter (Signed)
Pt states for the past couple of weeks she has been having heartburn even after taking her pantoprazole. Pt also reports she is having diarrhea alternating with constipation. Pt requesting to be seen. Pt scheduled to see Amy Esterwood PA 02/12/17@2pm . Pt aware of appt.

## 2017-02-12 ENCOUNTER — Encounter: Payer: Self-pay | Admitting: Physician Assistant

## 2017-02-12 ENCOUNTER — Ambulatory Visit (INDEPENDENT_AMBULATORY_CARE_PROVIDER_SITE_OTHER): Payer: Medicare Other | Admitting: Physician Assistant

## 2017-02-12 VITALS — BP 130/68 | HR 84 | Ht 62.0 in | Wt 143.4 lb

## 2017-02-12 DIAGNOSIS — K219 Gastro-esophageal reflux disease without esophagitis: Secondary | ICD-10-CM

## 2017-02-12 DIAGNOSIS — R194 Change in bowel habit: Secondary | ICD-10-CM

## 2017-02-12 MED ORDER — RANITIDINE HCL 150 MG PO TABS: ORAL_TABLET | ORAL | 6 refills | Status: DC

## 2017-02-12 NOTE — Patient Instructions (Addendum)
We have sent the following medications to your pharmacy for you to pick up at your convenience: [place]. 1. Zantac 150 mg  Continue the Protonix 20 mg Take 1 tablet by mouth every morning.  Call back if symptoms have not improved in 2 weeks.   It was a pleasure to see you in the office today. Thank you for the brownies.

## 2017-02-12 NOTE — Progress Notes (Addendum)
Subjective:    Patient ID: Tamara Robertson, female    DOB: 06/05/1929, 83 y.o.   MRN: [id number]  HPI Tamara Robertson is a pleasant 83 year old white female, known to Dr. Rhea Belton who was last seen in the office in March 2017. She comes in today with complaints of refractory GERD symptoms over the past 3 weeks. Patient has history of peripheral vascular disease, hypertension, IBS, chronic GERD, ITP for which she is maintained on low-dose steroids, she is status post cholecystectomy in 2016, previous splenectomy and sigmoidectomy in 2007 for complicated diverticular disease. Patient has been on pantoprazole 20 mg by mouth every morning long term and says this has worked very well to control her symptoms. Over the past 3 weeks or so she has been having daily symptoms of indigestion and reflux, sometimes even with just [place] water. She's also had increased burping and belching. She denies any odynophagia. Appetite has been fine no nausea or abdominal pain. She has not had any changes in her medications, supplements etc. had no recent antibiotics. She has not made any dietary changes. Symptoms are bothering her during the day starting after breakfast and she is not having any nocturnal symptoms. She has chronic problems with alternating bouts of diarrhea and constipation. It is been felt that this may in part be due to bile salt colopathy. She uses MiraLAX on a when necessary basis and Imodium on a when necessary basis. No recent changes in symptoms.  Review of Systems Pertinent positive and negative review of systems were noted in the above HPI section.  All other review of systems was otherwise negative.  Outpatient Encounter Prescriptions as of 02/12/2017  Medication Sig  . Biotin 2500 MCG CAPS Take by mouth daily.  . Calcium-Magnesium-Vitamin D (CITRACAL CALCIUM+D) 600-40-500 MG-MG-UNIT TB24 Take 1 tablet by mouth daily.   . clotrimazole-betamethasone (LOTRISONE) cream Apply 1 application topically 2 (two) times  daily as needed.  . Flaxseed MISC by Does not apply route. Take 1 tsp daily  . furosemide (LASIX) 20 MG tablet Take 1 tablet (20 mg total) by mouth daily.  [name] latanoprost (XALATAN) 0.005 % ophthalmic solution Place 1 drop into both eyes at bedtime.  . Multiple Vitamin (MULTIVITAMIN) tablet Take 1 tablet by mouth daily.    . pantoprazole (PROTONIX) 20 MG tablet TAKE ONE TABLET BY MOUTH ONCE DAILY  . polyethylene glycol (MIRALAX / GLYCOLAX) packet Take 17 g by mouth every 3 (three) days.  . predniSONE (DELTASONE) 2.5 MG tablet TAKE ONE TABLET BY MOUTH ONCE DAILY  . Probiotic Product (PROBIOTIC FORMULA PO) Take 1 tablet by mouth daily.    . ranitidine (ZANTAC) 150 MG tablet Take 1 tab by mouth every morning.   No facility-administered encounter medications on file as of 02/12/2017.    Allergies  Allergen Reactions  . Aspirin Other (See Comments)    ITP  . Sulfa Antibiotics Other (See Comments)    dizziness  . Contrast Media [Iodinated Diagnostic Agents] Hives  . Lasix [Furosemide] Rash  . Statins Other (See Comments)   Patient Active Problem List   Diagnosis Date Noted  . Varicose veins of both lower extremities 09/20/2016  . Hypertension 07/31/2016  . Leg edema 07/31/2016  . Prediabetes 06/05/2016  . Leg pain, anterior 01/15/2016  . Osteopenia 12/01/2015  . Anxiety 07/19/2015  . Degenerative cervical disc 12/28/2014  . Left ovarian cyst   . Peripheral neuropathy 06/03/2012  . Insomnia   . PVD (peripheral vascular disease) (HCC) 11/23/2010  . Bursitis of  hip 10/26/2010  . Hyperlipidemia 06/27/2010  . IMMUNE THROMBOCYTOPENIC PURPURA 06/27/2010  . GLUCOMA 06/27/2010  . GERD 06/27/2010  . IRRITABLE BOWEL SYNDROME 06/27/2010  . OVERACTIVE BLADDER 06/27/2010  . OSTEOARTHRITIS, KNEE, RIGHT 06/27/2010  . URINARY INCONTINENCE 06/27/2010   Social History   Social History  . Marital status: Widowed    Spouse name: N/A  . Number of children: N/A  . Years of education: N/A    Occupational History  . Retired     Programmer, multimedia   Social History Main Topics  . Smoking status: Never Smoker  . Smokeless tobacco: Never Used  . Alcohol use No     Comment: rarely  . Drug use: No  . Sexual activity: Not on file   Other Topics Concern  . 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    Tamara Robertson to [place] from [place] 05/2010 to be close to kids    Tamara Robertson family history includes Arthritis in her unknown relative; Coronary artery disease in her mother; Heart attack (age of onset: 37) in her mother; Hyperlipidemia in her daughter and mother; Hypertension in her daughter and mother; Stomach cancer in her father; Transient ischemic attack in her unknown relative.      Objective:    Vitals:   02/12/17 1353  BP: 130/68  Pulse: 84    Physical Exam  ;Well-developed elderly white female in no acute distress, very pleasant blood pressure 130/68 pulse 84, height 5 foot 2, weight 143, BMI 26.2. HEENT; nontraumatic normocephalic EOMI PERRLA sclera anicteric, Cardiovascular; regular rate and rhythm with S1-S2 no murmur or gallop, Pulmonary ;clear bilaterally, Abdomen; soft ,nontender nondistended bowel sounds are active there is no palpable mass or hepatosplenomegaly, Ext; no clubbing cyanosis or edema skin warm and dry, Neuropsych ;mood and affect appropriate       Assessment & Plan:   #32 83 year old white female with chronic GERD with symptoms refractory to pantoprazole 20 mg by mouth every morning 3 weeks #2 IBS #3 chronic alternating bowel habits. Probable component of bile salt colopathy post cholecystectomy in 2016 #4 ITP status post splenectomy #5 hypertension #6 peripheral vascular disease #7 history of diverticulitis, status post sigmoidectomy 2007  Plan; Continue antireflux regimen Continue pantoprazole 20 mg by mouth every morning, patient advised to take before breakfast Add Zantac  150 mg by mouth every morning. She will try this for about a month, if symptoms completely resolve, she may stop Zantac and go back to just pantoprazole 20 mg every morning. She will follow up with Dr. Rhea Belton or myself on an as-needed basis.  Michella Detjen S Nasra Counce PA-C 02/12/2017   Cc: Pincus Sanes, MD   Addendum: Reviewed and agree with initial management. Pyrtle, Carie Caddy, MD

## 2017-02-27 DIAGNOSIS — H903 Sensorineural hearing loss, bilateral: Secondary | ICD-10-CM | POA: Diagnosis not present

## 2017-03-05 ENCOUNTER — Encounter: Payer: Self-pay | Admitting: Vascular Surgery

## 2017-03-05 ENCOUNTER — Ambulatory Visit (INDEPENDENT_AMBULATORY_CARE_PROVIDER_SITE_OTHER): Payer: Medicare Other | Admitting: Vascular Surgery

## 2017-03-05 VITALS — BP 171/74 | HR 78 | Temp 97.8°F | Resp 20 | Ht 62.0 in | Wt 144.0 lb

## 2017-03-05 DIAGNOSIS — I83892 Varicose veins of left lower extremities with other complications: Secondary | ICD-10-CM

## 2017-03-05 NOTE — Progress Notes (Signed)
Subjective:     Patient ID: Tamara Robertson, female   DOB: 1929-10-12, 83 y.o.   MRN: [id number]  HPI This 83 year old female was seen in Dr. Leonette Most fields 3 months ago for evaluation of painful varicosities in the left leg. She has a remote history of vein stripping in the right leg. She has been having aching and throbbing and burning discomfort in the left leg as well as swelling. She has tried long leg elastic compression stockings 20-30 millimeter gradient as well as elevation and ibuprofen with no improvement. She was shown to have gross reflux in a enlarged left great saphenous vein in her last visit in August. She does have a history of the stent placed in her left superficial femoral artery most likely-she is quite certain-and occasionally she develops cramping discomfort in the legs when she ambulates. Stent was placed in 2011. This was in [facility].  Past Medical History:  Diagnosis Date  . Anxiety   . CAD (coronary artery disease)    RCA 40% stenosis cath 01/2011  . Cholelithiasis    s/p lap chole 09/2014  . Diverticular stricture [facility]) 2006   [facility]  . DIVERTICULITIS, HX OF   . Diverticulosis   . DYSLIPIDEMIA   . Elevated LFTs   . GERD   . Glaucoma   . Hepatic steatosis   . HOH (hard of hearing)   . Immune thrombocytopenic purpura (HCC)    chronic - baseline 80-100K, on pred  . Irritable bowel syndrome   . Left ovarian cyst dx 01/2013 CT   working with gyn, ?malignant - elevated tumor marker OVA1  . OSTEOARTHRITIS, KNEE, RIGHT   . OSTEOPENIA   . OVERACTIVE BLADDER   . UNSPECIFIED PERIPHERAL VASCULAR DISEASE   . URINARY INCONTINENCE     Social History  Substance Use Topics  . Smoking status: Never Smoker  . Smokeless tobacco: Never Used  . Alcohol use No     Comment: rarely    Family History  Problem Relation Age of Onset  . Coronary artery disease Mother   . Heart attack Mother 71  . Hyperlipidemia Mother   . Hypertension  Mother   . Stomach cancer Father   . Hypertension Daughter   . Hyperlipidemia Daughter   . Arthritis Unknown        parent  . Transient ischemic attack Unknown        parent  . Colon cancer Neg Hx     Allergies  Allergen Reactions  . Aspirin Other (See Comments)    ITP  . Sulfa Antibiotics Other (See Comments)    dizziness  . Contrast Media [Iodinated Diagnostic Agents] Hives  . Lasix [Furosemide] Rash  . Statins Other (See Comments)     Current Outpatient Prescriptions:  .  Biotin 2500 MCG CAPS, Take by mouth daily., Disp: , Rfl:  .  Calcium-Magnesium-Vitamin D (CITRACAL CALCIUM+D) 600-40-500 MG-MG-UNIT TB24, Take 1 tablet by mouth daily. , Disp: , Rfl:  .  clotrimazole-betamethasone (LOTRISONE) cream, Apply 1 application topically 2 (two) times daily as needed., Disp: 15 g, Rfl: 1 .  Flaxseed MISC, by Does not apply route. Take 1 tsp daily, Disp: , Rfl:  .  furosemide (LASIX) 20 MG tablet, Take 1 tablet (20 mg total) by mouth daily., Disp: 90 tablet, Rfl: 3 .  latanoprost (XALATAN) 0.005 % ophthalmic solution, Place 1 drop into both eyes at bedtime., Disp: , Rfl:  .  Multiple Vitamin (MULTIVITAMIN) tablet, Take 1  tablet by mouth daily.  , Disp: , Rfl:  .  pantoprazole (PROTONIX) 20 MG tablet, TAKE ONE TABLET BY MOUTH ONCE DAILY, Disp: 90 tablet, Rfl: 2 .  polyethylene glycol (MIRALAX / GLYCOLAX) packet, Take 17 g by mouth every 3 (three) days., Disp: , Rfl:  .  predniSONE (DELTASONE) 2.5 MG tablet, TAKE ONE TABLET BY MOUTH ONCE DAILY, Disp: 90 tablet, Rfl: 3 .  Probiotic Product (PROBIOTIC FORMULA PO), Take 1 tablet by mouth daily.  , Disp: , Rfl:  .  ranitidine (ZANTAC) 150 MG tablet, Take 1 tab by mouth every morning., Disp: 30 tablet, Rfl: 6  Vitals:   03/05/17 1332 03/05/17 1333  BP: (!) 174/75 (!) 171/74  Pulse: 78   Resp: 20   Temp: 97.8 F (36.6 C)   TempSrc: Oral   SpO2: 98%   Weight: 144 lb (65.3 kg)   Height: 5\' 2"  (1.575 m)     Body mass index is 26.34  kg/m.         Review of Systems Denies chest pain, dyspnea on exertion, PND, orthopnea, hemoptysis    Objective:   Physical Exam BP (!) 171/74 Comment: recheck  Pulse 78   Temp 97.8 F (36.6 C) (Oral)   Resp 20   Ht 5\' 2"  (1.575 m)   Wt 144 lb (65.3 kg)   SpO2 98%   BMI 26.34 kg/m   Gen. elderly spry female no apparent distress alert and oriented 3 Lungs no rhonchi or wheezing Left leg with bulging varicosities in the medial thigh and medial calf with prominent varicosities near left medial malleolus. No ulceration noted. Right leg with some bulging varicosities in the medial calf area and extending down toward the ankle. No hyperpigmentation or ulceration bilaterally. Left leg with week lead palpable posterior tibial pulse.  Brisk PT and DP flow bilaterally with Doppler but not triphasic  Today I reviewed the venous reflux exam from last visit which revealed an enlarged left great saphenous vein with gross reflux and no DVT     Assessment:     #1 varicose veins left leg due to gross reflux left great saphenous vein Status post ligation stripping right great saphenous vein many years ago and laser treatment 10 years ago right leg #3 PAD with previous intra-arterial stent most likely in left superficial femoral artery currently patent #4 hypertension    Plan:     Patient is having mild symptoms in the left leg from her varicose veins but since she has no right great saphenous vein remaining and she does have PAD with stent in her left superficial femoral artery, I have recommended not performing laser ablation left great saphenous vein to preserve her great saphenous vein for possible conduit if patient should need femoral-popliteal bypass grafting for limb salvage in the future If her symptoms worsen or she develops bleeding or ulceration she will be in touch with [place] for reevaluation She agrees with this plan

## 2017-03-18 ENCOUNTER — Ambulatory Visit: Payer: Medicare Other | Admitting: Vascular Surgery

## 2017-03-19 ENCOUNTER — Ambulatory Visit: Payer: Medicare Other | Admitting: Vascular Surgery

## 2017-04-07 ENCOUNTER — Other Ambulatory Visit: Payer: Self-pay | Admitting: Internal Medicine

## 2017-04-16 ENCOUNTER — Ambulatory Visit: Payer: Medicare Other | Admitting: Oncology

## 2017-04-16 ENCOUNTER — Other Ambulatory Visit: Payer: Medicare Other

## 2017-04-17 ENCOUNTER — Telehealth: Payer: Self-pay | Admitting: Oncology

## 2017-04-17 ENCOUNTER — Other Ambulatory Visit (HOSPITAL_BASED_OUTPATIENT_CLINIC_OR_DEPARTMENT_OTHER): Payer: Medicare Other

## 2017-04-17 ENCOUNTER — Ambulatory Visit: Payer: Medicare Other | Admitting: Oncology

## 2017-04-17 VITALS — BP 139/78 | HR 77 | Temp 97.7°F | Resp 18 | Ht 62.0 in | Wt 143.6 lb

## 2017-04-17 DIAGNOSIS — D693 Immune thrombocytopenic purpura: Secondary | ICD-10-CM | POA: Diagnosis not present

## 2017-04-17 DIAGNOSIS — M818 Other osteoporosis without current pathological fracture: Secondary | ICD-10-CM

## 2017-04-17 LAB — CBC WITH DIFFERENTIAL/PLATELET
BASO%: 0.3 % (ref 0.0–2.0)
BASOS ABS: 0 10*3/uL (ref 0.0–0.1)
EOS%: 1.9 % (ref 0.0–7.0)
Eosinophils Absolute: 0.2 10*3/uL (ref 0.0–0.5)
HEMATOCRIT: 42.2 % (ref 34.8–46.6)
HGB: 13.9 g/dL (ref 11.6–15.9)
LYMPH#: 3.5 10*3/uL — AB (ref 0.9–3.3)
LYMPH%: 34 % (ref 14.0–49.7)
MCH: 28.5 pg (ref 25.1–34.0)
MCHC: 32.9 g/dL (ref 31.5–36.0)
MCV: 86.7 fL (ref 79.5–101.0)
MONO#: 1.2 10*3/uL — AB (ref 0.1–0.9)
MONO%: 11.9 % (ref 0.0–14.0)
NEUT#: 5.3 10*3/uL (ref 1.5–6.5)
NEUT%: 51.9 % (ref 38.4–76.8)
PLATELETS: 206 10*3/uL (ref 145–400)
RBC: 4.87 10*6/uL (ref 3.70–5.45)
RDW: 14.3 % (ref 11.2–14.5)
WBC: 10.2 10*3/uL (ref 3.9–10.3)

## 2017-04-17 NOTE — Progress Notes (Signed)
Hematology and Oncology Follow Up Visit  Tamara Robertson [id number] [birth year] 83 y.o. 04/17/2017 1:27 PM   Principle Diagnosis: 83 year old with chronic ITP diagnosed in the 75s. She continues to be in remission at this time.  Prior Therapy:  She is S/P splenectomy and subsequently treated with high doses of steroids. The patient have had a complete response to steroids back in the 60s and all the way have had a few relapses. Every time she has a relapse she gets restarted on high-dose of steroids and she achieved a complete response. The most recent of relapses before her move to [facility] she was hospitalized for a platelet count of 8000 around the year 2000.  Current therapy: Prednisone to 2.5 mg every other day. She has been in remission since 2000.   Interim History: Tamara Robertson returns today for a follow up visit by himself. Since the last visit, she reports no changes in her health.  He remains active and currently volunteering periodically at the hospital. She continues to drive and attends to activities of daily living.  She continues to take prednisone without any new complications.  He denies any dyspepsia or hyperglycemia.  She denies any bleeding complications.  He denies any epistaxis, hematochezia or melena.  She does not report any bruising or petechiae.  She has not reported any headaches or blurry vision or double vision. Has not reported any chest pain cough or hemoptysis. Has not reported any nausea or vomiting or abdominal pain. Has not reported any frequency urgency or hematuria. She does not report any skeletal complaints. Rest or view of systems unremarkable.   Medications: I have reviewed the patient's current medications.  Current Outpatient Medications  Medication Sig Dispense Refill  . Biotin 2500 MCG CAPS Take by mouth daily.    . Calcium-Magnesium-Vitamin D (CITRACAL CALCIUM+D) 600-40-500 MG-MG-UNIT TB24 Take 1 tablet by mouth daily.     .  clotrimazole-betamethasone (LOTRISONE) cream Apply 1 application topically 2 (two) times daily as needed. 15 g 1  . Flaxseed MISC by Does not apply route. Take 1 tsp daily    . furosemide (LASIX) 20 MG tablet Take 1 tablet (20 mg total) by mouth daily. 90 tablet 3  . latanoprost (XALATAN) 0.005 % ophthalmic solution Place 1 drop into both eyes at bedtime.    . Multiple Vitamin (MULTIVITAMIN) tablet Take 1 tablet by mouth daily.      . pantoprazole (PROTONIX) 20 MG tablet TAKE 1 TABLET BY MOUTH ONCE DAILY 90 tablet 2  . polyethylene glycol (MIRALAX / GLYCOLAX) packet Take 17 g by mouth every 3 (three) days.    . predniSONE (DELTASONE) 2.5 MG tablet TAKE ONE TABLET BY MOUTH ONCE DAILY 90 tablet 3  . Probiotic Product (PROBIOTIC FORMULA PO) Take 1 tablet by mouth daily.      . ranitidine (ZANTAC) 150 MG tablet Take 1 tab by mouth every morning. 30 tablet 6   No current facility-administered medications for this visit.     Allergies:  Allergies  Allergen Reactions  . Aspirin Other (See Comments)    ITP  . Sulfa Antibiotics Other (See Comments)    dizziness  . Contrast Media [Iodinated Diagnostic Agents] Hives  . Lasix [Furosemide] Rash  . Statins Other (See Comments)    Past Medical History, Surgical history, Social history, and Family History were reviewed and updated.  Physical Exam: Blood pressure 139/78, pulse 77, temperature 97.7 F (36.5 C), temperature source Oral, resp. rate 18, height 5\' 2"  (1.575 m),  weight 143 lb 9.6 oz (65.1 kg), SpO2 100 %. ECOG: 1 General appearance: Well-appearing woman without distress. Head: Normocephalic, without obvious abnormality no oral thrush or ulcers. Neck: no adenopathy.  Lymph nodes: Cervical, supraclavicular, and axillary nodes normal. Heart:regular rate and rhythm, S1, S2 normal, no murmur, click, rub or gallop Lung:chest clear, no wheezing, rales, normal symmetric air entry Abdomin: soft, non-tender, without masses or organomegaly no  rebound or guarding. EXT:no erythema, induration, or nodules Neurological: No deficits noted. Skin: No rashes or lesions.    Lab Results: Lab Results  Component Value Date   WBC 10.2 04/17/2017   HGB 13.9 04/17/2017   HCT 42.2 04/17/2017   MCV 86.7 04/17/2017   PLT 206 04/17/2017     Chemistry      Component Value Date/Time   NA 139 12/04/2016 1604   NA 142 02/16/2016 1526   K 4.4 12/04/2016 1604   K 4.3 02/16/2016 1526   CL 102 12/04/2016 1604   CL 105 04/17/2012 1452   CO2 30 12/04/2016 1604   CO2 27 02/16/2016 1526   BUN 15 12/04/2016 1604   BUN 14.3 02/16/2016 1526   CREATININE 0.88 12/04/2016 1604   CREATININE 0.8 02/16/2016 1526      Component Value Date/Time   CALCIUM 9.4 12/04/2016 1604   CALCIUM 9.5 02/16/2016 1526   ALKPHOS 74 12/04/2016 1604   ALKPHOS 92 02/16/2016 1526   AST 19 12/04/2016 1604   AST 19 02/16/2016 1526   ALT 12 12/04/2016 1604   ALT 11 02/16/2016 1526   BILITOT 0.5 12/04/2016 1604   BILITOT 0.33 02/16/2016 1526      Impression and Plan:   83 year old woman with the following issues:  1. Chronic relapsing immune thrombocytopenia: He is status post multiple therapies including high-dose prednisone in the past but have been in remission since your 2000.   Her platelet count and rest of her laboratory data were reviewed today and continues to show remission.  The plan is to continue with active surveillance and maintain low-dose prednisone.  2. Osteoporosis: Could be exacerbated from her long term steroid use.  She is currently on calcium and vitamin D.  3. Follow-up: Will be in 6 months.  Eli Hose, MD 12/12/20181:27 PM

## 2017-04-17 NOTE — Telephone Encounter (Signed)
Scheduled appt per 12/12 los - Gave patient aVS and calender per los.

## 2017-05-13 NOTE — Progress Notes (Signed)
Subjective:    Patient ID: Tamara Robertson, female    DOB: 1929/08/19, 84 y.o.   MRN: [id number]  HPI The patient is here for follow up.  Eye dryness:  She is seeing ophtho.  She has tried everything he has advised and it helps a little.  She has a follow up appt with Friday.  She was wondering if any of her current medications could be exacerbating the dryness.  Lower back pain:  She has chronic lower back pain. She sees ortho.  She does have a more persistent pain in the lower back slightly to the left of her lumbar spine-she wants to make sure it is not her kidneys.   Hypertension: She is taking her medication daily. She is compliant with a low sodium diet.  She denies chest pain, palpitations, shortness of breath and regular headaches. She is exercising regularly.     Prediabetes:  She is fairly compliant with a low sugar/carbohydrate diet.  She is exercising regularly.  Leg edema, varicose veins:  She is seeing vascular.  She is taking lasix daily.  She has a stent in her left upper middle leg and that is where she often feels a dull ache.  She wondered if that was related to the stent or not.  She also experiences cramping in the upper left leg near her groin.  Typically this only occurs when she is getting out of bed in the morning.  She does not feel it during the day.  She wondered if this was related to being stent or something different.    Osteopenia, high frax:   We have reviewed her bone density in the past.  At this point she is not interested in taking any medication for her bones.    She understands she is at risk for breaking something.  She is taking calcium and vitamin D.  She is exercising.  She plans on attending a balance class.  Medications and allergies reviewed with patient and updated if appropriate.  Patient Active Problem List   Diagnosis Date Noted  . Varicose veins of left lower extremity with complications 03/05/2017  . Varicose veins of both lower extremities  09/20/2016  . Hypertension 07/31/2016  . Leg edema 07/31/2016  . Prediabetes 06/05/2016  . Leg pain, anterior 01/15/2016  . Osteopenia 12/01/2015  . Anxiety 07/19/2015  . Degenerative cervical disc 12/28/2014  . Left ovarian cyst   . Peripheral neuropathy 06/03/2012  . Insomnia   . PVD (peripheral vascular disease) (HCC) 11/23/2010  . Bursitis of hip 10/26/2010  . Hyperlipidemia 06/27/2010  . IMMUNE THROMBOCYTOPENIC PURPURA 06/27/2010  . GLUCOMA 06/27/2010  . GERD 06/27/2010  . IRRITABLE BOWEL SYNDROME 06/27/2010  . OVERACTIVE BLADDER 06/27/2010  . OSTEOARTHRITIS, KNEE, RIGHT 06/27/2010  . URINARY INCONTINENCE 06/27/2010    Current Outpatient Medications on File Prior to Visit  Medication Sig Dispense Refill  . Biotin 2500 MCG CAPS Take by mouth daily.    . Calcium-Magnesium-Vitamin D (CITRACAL CALCIUM+D) 600-40-500 MG-MG-UNIT TB24 Take 1 tablet by mouth daily.     . clotrimazole-betamethasone (LOTRISONE) cream Apply 1 application topically 2 (two) times daily as needed. 15 g 1  . Flaxseed MISC by Does not apply route. Take 1 tsp daily    . furosemide (LASIX) 20 MG tablet Take 1 tablet (20 mg total) by mouth daily. 90 tablet 3  . latanoprost (XALATAN) 0.005 % ophthalmic solution Place 1 drop into both eyes at bedtime.    . Multiple Vitamin (  MULTIVITAMIN) tablet Take 1 tablet by mouth daily.      . pantoprazole (PROTONIX) 20 MG tablet TAKE 1 TABLET BY MOUTH ONCE DAILY 90 tablet 2  . polyethylene glycol (MIRALAX / GLYCOLAX) packet Take 17 g by mouth every 3 (three) days.    . predniSONE (DELTASONE) 2.5 MG tablet TAKE ONE TABLET BY MOUTH ONCE DAILY 90 tablet 3  . Probiotic Product (PROBIOTIC FORMULA PO) Take 1 tablet by mouth daily.      . ranitidine (ZANTAC) 150 MG tablet Take 1 tab by mouth every morning. 30 tablet 6   No current facility-administered medications on file prior to visit.     Past Medical History:  Diagnosis Date  . Anxiety   . CAD (coronary artery disease)      RCA 40% stenosis cath 01/2011  . Cholelithiasis    s/p lap chole 09/2014  . Diverticular stricture [facility]) 2006   [facility]  . DIVERTICULITIS, HX OF   . Diverticulosis   . DYSLIPIDEMIA   . Elevated LFTs   . GERD   . Glaucoma   . Hepatic steatosis   . HOH (hard of hearing)   . Immune thrombocytopenic purpura (HCC)    chronic - baseline 80-100K, on pred  . Irritable bowel syndrome   . Left ovarian cyst dx 01/2013 CT   working with gyn, ?malignant - elevated tumor marker OVA1  . OSTEOARTHRITIS, KNEE, RIGHT   . OSTEOPENIA   . OVERACTIVE BLADDER   . UNSPECIFIED PERIPHERAL VASCULAR DISEASE   . URINARY INCONTINENCE     Past Surgical History:  Procedure Laterality Date  . ABDOMINAL HYSTERECTOMY  1963  . ANGIOPLASTY    . APPENDECTOMY  1956  . CARDIAC CATHETERIZATION    . CATARACT EXTRACTION, BILATERAL  10/2010  . CHOLECYSTECTOMY N/A 09/16/2014   Procedure: LAPAROSCOPIC CHOLECYSTECTOMY ;  Surgeon: Emelia Loron, MD;  Location: [facility] OR;  Service: General;  Laterality: N/A;  . KNEE ARTHROSCOPY Right   . L pop PTA  10/2009   stent  . LAPAROSCOPIC SIGMOID COLECTOMY  10/2005  . SPLENECTOMY  1954  . VARICOSE VEIN SURGERY Right 1962    Social History   Socioeconomic History  . Marital status: Widowed    Spouse name: None  . Number of children: None  . Years of education: None  . Highest education level: None  Social Needs  . Financial resource strain: None  . Food insecurity - worry: None  . Food insecurity - inability: None  . Transportation needs - medical: None  . Transportation needs - non-medical: None  Occupational History  . Occupation: Retired    Comment: Programmer, multimedia  Tobacco Use  . Smoking status: Never Smoker  . Smokeless tobacco: Never Used  Substance and Sexual Activity  . Alcohol use: No    Alcohol/week: 0.0 oz    Comment: rarely  . Drug use: No  . Sexual activity: None  Other Topics Concern  . 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    [place] to [place] from [place] 05/2010 to be close to kids    Family History  Problem Relation Age of Onset  . Coronary artery disease Mother   . Heart attack Mother 66  . Hyperlipidemia Mother   . Hypertension Mother   . Stomach cancer Father   . Hypertension Daughter   . Hyperlipidemia Daughter   . Arthritis Unknown  parent  . Transient ischemic attack Unknown        parent  . Colon cancer Neg Hx     Review of Systems  Constitutional: Negative for chills and fever.  Respiratory: Negative for cough, shortness of breath and wheezing.   Cardiovascular: Positive for leg swelling (mild, better). Negative for chest pain and palpitations.  Neurological: Negative for light-headedness and headaches.       Objective:   Vitals:   05/14/17 1348 05/14/17 1430  BP: (!) 172/80 (!) 152/72  Pulse: 89   Resp: 16   Temp: 97.6 F (36.4 C)   SpO2: 98%    Wt Readings from Last 3 Encounters:  05/14/17 144 lb (65.3 kg)  04/17/17 143 lb 9.6 oz (65.1 kg)  03/05/17 144 lb (65.3 kg)   Body mass index is 26.34 kg/m.   Physical Exam    Constitutional: Appears well-developed and well-nourished. No distress.  HENT:  Head: Normocephalic and atraumatic.  Neck: Neck supple. No tracheal deviation present. No thyromegaly present.  No cervical lymphadenopathy Cardiovascular: Normal rate, regular rhythm and normal heart sounds.   No murmur heard. No carotid bruit .  No edema Pulmonary/Chest: Effort normal and breath sounds normal. No respiratory distress. No has no wheezes. No rales.  Skin: Skin is warm and dry. Not diaphoretic.  Psychiatric: Normal mood and affect. Behavior is normal.      Assessment & Plan:    See Problem List for Assessment and Plan of chronic medical problems.

## 2017-05-13 NOTE — Patient Instructions (Addendum)

## 2017-05-14 ENCOUNTER — Encounter: Payer: Self-pay | Admitting: Internal Medicine

## 2017-05-14 ENCOUNTER — Ambulatory Visit: Payer: Medicare Other | Admitting: Internal Medicine

## 2017-05-14 VITALS — BP 152/72 | HR 89 | Temp 97.6°F | Resp 16 | Wt 144.0 lb

## 2017-05-14 DIAGNOSIS — G8929 Other chronic pain: Secondary | ICD-10-CM

## 2017-05-14 DIAGNOSIS — R7303 Prediabetes: Secondary | ICD-10-CM

## 2017-05-14 DIAGNOSIS — M545 Low back pain, unspecified: Secondary | ICD-10-CM | POA: Insufficient documentation

## 2017-05-14 DIAGNOSIS — R600 Localized edema: Secondary | ICD-10-CM

## 2017-05-14 DIAGNOSIS — M8580 Other specified disorders of bone density and structure, unspecified site: Secondary | ICD-10-CM

## 2017-05-14 DIAGNOSIS — I10 Essential (primary) hypertension: Secondary | ICD-10-CM

## 2017-05-14 MED ORDER — ZOSTER VAC RECOMB ADJUVANTED 50 MCG/0.5ML IM SUSR: 0.5000 mL | Freq: Once | INTRAMUSCULAR | 1 refills | Status: AC

## 2017-05-14 MED FILL — SHINGRIX 50 MCG SUS: 50 | 30 days supply | Qty: 1 | Fill #0

## 2017-05-14 NOTE — Assessment & Plan Note (Signed)
Controlled Continue current dose of lasix cmp

## 2017-05-14 NOTE — Assessment & Plan Note (Addendum)
Does not want to try any medication for OP-she understands she is at an increased risk of breaking something Exercise Going to a balance class Take calcium and vitamin D

## 2017-05-14 NOTE — Assessment & Plan Note (Signed)
Sees Dr Lynann Bologna Will f/u with ortho

## 2017-05-14 NOTE — Assessment & Plan Note (Signed)
Blood pressure elevated here today, slightly better on repeat We will need to monitor more closely If blood pressure remains elevated at her visits we will need to start a blood pressure medication CMP

## 2017-05-14 NOTE — Assessment & Plan Note (Signed)
Check a1c Low sugar / carb diet Stressed regular exercise   

## 2017-05-15 ENCOUNTER — Ambulatory Visit: Payer: Medicare Other | Admitting: Internal Medicine

## 2017-05-17 DIAGNOSIS — H401431 Capsular glaucoma with pseudoexfoliation of lens, bilateral, mild stage: Secondary | ICD-10-CM | POA: Diagnosis not present

## 2017-05-17 DIAGNOSIS — H04121 Dry eye syndrome of right lacrimal gland: Secondary | ICD-10-CM | POA: Diagnosis not present

## 2017-05-17 DIAGNOSIS — H04122 Dry eye syndrome of left lacrimal gland: Secondary | ICD-10-CM | POA: Diagnosis not present

## 2017-05-23 ENCOUNTER — Other Ambulatory Visit (INDEPENDENT_AMBULATORY_CARE_PROVIDER_SITE_OTHER): Payer: Medicare Other

## 2017-05-23 ENCOUNTER — Encounter: Payer: Self-pay | Admitting: Internal Medicine

## 2017-05-23 DIAGNOSIS — I10 Essential (primary) hypertension: Secondary | ICD-10-CM | POA: Diagnosis not present

## 2017-05-23 DIAGNOSIS — R7303 Prediabetes: Secondary | ICD-10-CM | POA: Diagnosis not present

## 2017-05-23 LAB — LIPID PANEL
CHOLESTEROL: 188 mg/dL (ref 0–200)
HDL: 58.4 mg/dL (ref >39.00)
LDL Cholesterol: 107 mg/dL — ABNORMAL HIGH (ref 0–99)
NonHDL: 129.61
TRIGLYCERIDES: 115 mg/dL (ref 0.0–149.0)
Total CHOL/HDL Ratio: 3
VLDL: 23 mg/dL (ref 0.0–40.0)

## 2017-05-23 LAB — COMPREHENSIVE METABOLIC PANEL
ALBUMIN: 4.4 g/dL (ref 3.5–5.2)
ALK PHOS: 72 U/L (ref 39–117)
ALT: 12 U/L (ref 0–35)
AST: 20 U/L (ref 0–37)
BUN: 19 mg/dL (ref 6–23)
CO2: 28 mEq/L (ref 19–32)
Calcium: 9.4 mg/dL (ref 8.4–10.5)
Chloride: 103 mEq/L (ref 96–112)
Creatinine, Ser: 0.76 mg/dL (ref 0.40–1.20)
GFR: 76.75 mL/min (ref >60.00)
Glucose, Bld: 95 mg/dL (ref 70–99)
POTASSIUM: 4.4 meq/L (ref 3.5–5.1)
Sodium: 139 mEq/L (ref 135–145)
TOTAL PROTEIN: 7.2 g/dL (ref 6.0–8.3)
Total Bilirubin: 0.6 mg/dL (ref 0.2–1.2)

## 2017-05-23 LAB — HEMOGLOBIN A1C: Hgb A1c MFr Bld: 5.8 % (ref 4.6–6.5)

## 2017-05-26 ENCOUNTER — Encounter: Payer: Self-pay | Admitting: Internal Medicine

## 2017-05-27 ENCOUNTER — Encounter: Payer: Self-pay | Admitting: Internal Medicine

## 2017-06-06 ENCOUNTER — Telehealth: Payer: Self-pay | Admitting: *Deleted

## 2017-06-06 NOTE — Telephone Encounter (Signed)
Attempted to return the patient's call with no answer.

## 2017-06-25 ENCOUNTER — Encounter: Payer: Self-pay | Admitting: Internal Medicine

## 2017-06-25 ENCOUNTER — Other Ambulatory Visit (INDEPENDENT_AMBULATORY_CARE_PROVIDER_SITE_OTHER): Payer: Medicare Other

## 2017-06-25 ENCOUNTER — Ambulatory Visit: Payer: Self-pay | Admitting: *Deleted

## 2017-06-25 ENCOUNTER — Encounter (HOSPITAL_COMMUNITY): Payer: Self-pay

## 2017-06-25 ENCOUNTER — Inpatient Hospital Stay (HOSPITAL_COMMUNITY)
Admission: EM | Admit: 2017-06-25 | Discharge: 2017-07-09 | DRG: 870 | Disposition: A | Discharge status: Skilled Nursing Facility | Payer: Medicare Other | Attending: Internal Medicine | Admitting: Internal Medicine

## 2017-06-25 ENCOUNTER — Ambulatory Visit (INDEPENDENT_AMBULATORY_CARE_PROVIDER_SITE_OTHER)
Admission: RE | Admit: 2017-06-25 | Discharge: 2017-06-25 | Disposition: A | Discharge status: Home / Self Care | Payer: Medicare Other | Source: Ambulatory Visit | Attending: Internal Medicine | Admitting: Internal Medicine

## 2017-06-25 ENCOUNTER — Other Ambulatory Visit: Payer: Self-pay

## 2017-06-25 ENCOUNTER — Ambulatory Visit: Payer: Medicare Other | Admitting: Internal Medicine

## 2017-06-25 ENCOUNTER — Other Ambulatory Visit: Payer: Medicare Other

## 2017-06-25 ENCOUNTER — Emergency Department (HOSPITAL_COMMUNITY): Payer: Medicare Other

## 2017-06-25 VITALS — BP 96/54 | HR 97 | Temp 98.2°F | Resp 16 | Wt 146.0 lb

## 2017-06-25 DIAGNOSIS — R059 Cough, unspecified: Secondary | ICD-10-CM

## 2017-06-25 DIAGNOSIS — J9600 Acute respiratory failure, unspecified whether with hypoxia or hypercapnia: Secondary | ICD-10-CM | POA: Diagnosis not present

## 2017-06-25 DIAGNOSIS — I10 Essential (primary) hypertension: Secondary | ICD-10-CM | POA: Diagnosis not present

## 2017-06-25 DIAGNOSIS — J181 Lobar pneumonia, unspecified organism: Secondary | ICD-10-CM | POA: Diagnosis not present

## 2017-06-25 DIAGNOSIS — Z7952 Long term (current) use of systemic steroids: Secondary | ICD-10-CM

## 2017-06-25 DIAGNOSIS — Z888 Allergy status to other drugs, medicaments and biological substances status: Secondary | ICD-10-CM

## 2017-06-25 DIAGNOSIS — R69 Illness, unspecified: Secondary | ICD-10-CM | POA: Diagnosis not present

## 2017-06-25 DIAGNOSIS — Z79899 Other long term (current) drug therapy: Secondary | ICD-10-CM

## 2017-06-25 DIAGNOSIS — R509 Fever, unspecified: Secondary | ICD-10-CM

## 2017-06-25 DIAGNOSIS — R05 Cough: Secondary | ICD-10-CM | POA: Diagnosis not present

## 2017-06-25 DIAGNOSIS — M8580 Other specified disorders of bone density and structure, unspecified site: Secondary | ICD-10-CM | POA: Diagnosis present

## 2017-06-25 DIAGNOSIS — K580 Irritable bowel syndrome with diarrhea: Secondary | ICD-10-CM | POA: Diagnosis present

## 2017-06-25 DIAGNOSIS — R6520 Severe sepsis without septic shock: Secondary | ICD-10-CM | POA: Diagnosis not present

## 2017-06-25 DIAGNOSIS — Y929 Unspecified place or not applicable: Secondary | ICD-10-CM

## 2017-06-25 DIAGNOSIS — D693 Immune thrombocytopenic purpura: Secondary | ICD-10-CM | POA: Diagnosis present

## 2017-06-25 DIAGNOSIS — Z8249 Family history of ischemic heart disease and other diseases of the circulatory system: Secondary | ICD-10-CM | POA: Diagnosis not present

## 2017-06-25 DIAGNOSIS — R0902 Hypoxemia: Secondary | ICD-10-CM | POA: Diagnosis not present

## 2017-06-25 DIAGNOSIS — J90 Pleural effusion, not elsewhere classified: Secondary | ICD-10-CM | POA: Diagnosis not present

## 2017-06-25 DIAGNOSIS — J189 Pneumonia, unspecified organism: Secondary | ICD-10-CM | POA: Diagnosis present

## 2017-06-25 DIAGNOSIS — D696 Thrombocytopenia, unspecified: Secondary | ICD-10-CM | POA: Diagnosis not present

## 2017-06-25 DIAGNOSIS — D729 Disorder of white blood cells, unspecified: Secondary | ICD-10-CM

## 2017-06-25 DIAGNOSIS — R52 Pain, unspecified: Secondary | ICD-10-CM | POA: Diagnosis not present

## 2017-06-25 DIAGNOSIS — I2510 Atherosclerotic heart disease of native coronary artery without angina pectoris: Secondary | ICD-10-CM | POA: Diagnosis not present

## 2017-06-25 DIAGNOSIS — M160 Bilateral primary osteoarthritis of hip: Secondary | ICD-10-CM | POA: Diagnosis not present

## 2017-06-25 DIAGNOSIS — E878 Other disorders of electrolyte and fluid balance, not elsewhere classified: Secondary | ICD-10-CM | POA: Diagnosis not present

## 2017-06-25 DIAGNOSIS — Z4682 Encounter for fitting and adjustment of non-vascular catheter: Secondary | ICD-10-CM | POA: Diagnosis not present

## 2017-06-25 DIAGNOSIS — K219 Gastro-esophageal reflux disease without esophagitis: Secondary | ICD-10-CM | POA: Diagnosis present

## 2017-06-25 DIAGNOSIS — Z978 Presence of other specified devices: Secondary | ICD-10-CM

## 2017-06-25 DIAGNOSIS — I361 Nonrheumatic tricuspid (valve) insufficiency: Secondary | ICD-10-CM | POA: Diagnosis not present

## 2017-06-25 DIAGNOSIS — G92 Toxic encephalopathy: Secondary | ICD-10-CM | POA: Diagnosis not present

## 2017-06-25 DIAGNOSIS — E785 Hyperlipidemia, unspecified: Secondary | ICD-10-CM | POA: Diagnosis present

## 2017-06-25 DIAGNOSIS — E781 Pure hyperglyceridemia: Secondary | ICD-10-CM | POA: Diagnosis present

## 2017-06-25 DIAGNOSIS — Z9889 Other specified postprocedural states: Secondary | ICD-10-CM | POA: Diagnosis not present

## 2017-06-25 DIAGNOSIS — Z886 Allergy status to analgesic agent status: Secondary | ICD-10-CM

## 2017-06-25 DIAGNOSIS — I340 Nonrheumatic mitral (valve) insufficiency: Secondary | ICD-10-CM | POA: Diagnosis not present

## 2017-06-25 DIAGNOSIS — J9601 Acute respiratory failure with hypoxia: Secondary | ICD-10-CM | POA: Diagnosis not present

## 2017-06-25 DIAGNOSIS — E872 Acidosis: Secondary | ICD-10-CM | POA: Diagnosis present

## 2017-06-25 DIAGNOSIS — A409 Streptococcal sepsis, unspecified: Secondary | ICD-10-CM | POA: Diagnosis not present

## 2017-06-25 DIAGNOSIS — R278 Other lack of coordination: Secondary | ICD-10-CM | POA: Diagnosis not present

## 2017-06-25 DIAGNOSIS — M25552 Pain in left hip: Secondary | ICD-10-CM | POA: Diagnosis not present

## 2017-06-25 DIAGNOSIS — I639 Cerebral infarction, unspecified: Secondary | ICD-10-CM | POA: Diagnosis not present

## 2017-06-25 DIAGNOSIS — R6510 Systemic inflammatory response syndrome (SIRS) of non-infectious origin without acute organ dysfunction: Secondary | ICD-10-CM | POA: Diagnosis not present

## 2017-06-25 DIAGNOSIS — J9690 Respiratory failure, unspecified, unspecified whether with hypoxia or hypercapnia: Secondary | ICD-10-CM | POA: Diagnosis not present

## 2017-06-25 DIAGNOSIS — Z91041 Radiographic dye allergy status: Secondary | ICD-10-CM

## 2017-06-25 DIAGNOSIS — F419 Anxiety disorder, unspecified: Secondary | ICD-10-CM | POA: Diagnosis present

## 2017-06-25 DIAGNOSIS — R001 Bradycardia, unspecified: Secondary | ICD-10-CM | POA: Diagnosis not present

## 2017-06-25 DIAGNOSIS — H409 Unspecified glaucoma: Secondary | ICD-10-CM | POA: Diagnosis present

## 2017-06-25 DIAGNOSIS — T50995A Adverse effect of other drugs, medicaments and biological substances, initial encounter: Secondary | ICD-10-CM | POA: Diagnosis not present

## 2017-06-25 DIAGNOSIS — D649 Anemia, unspecified: Secondary | ICD-10-CM | POA: Diagnosis not present

## 2017-06-25 DIAGNOSIS — E876 Hypokalemia: Secondary | ICD-10-CM | POA: Diagnosis not present

## 2017-06-25 DIAGNOSIS — E860 Dehydration: Secondary | ICD-10-CM | POA: Diagnosis not present

## 2017-06-25 DIAGNOSIS — R079 Chest pain, unspecified: Secondary | ICD-10-CM | POA: Diagnosis not present

## 2017-06-25 DIAGNOSIS — R739 Hyperglycemia, unspecified: Secondary | ICD-10-CM | POA: Diagnosis not present

## 2017-06-25 DIAGNOSIS — J154 Pneumonia due to other streptococci: Secondary | ICD-10-CM | POA: Diagnosis not present

## 2017-06-25 DIAGNOSIS — R091 Pleurisy: Secondary | ICD-10-CM | POA: Diagnosis not present

## 2017-06-25 DIAGNOSIS — E569 Vitamin deficiency, unspecified: Secondary | ICD-10-CM | POA: Diagnosis not present

## 2017-06-25 DIAGNOSIS — A419 Sepsis, unspecified organism: Secondary | ICD-10-CM | POA: Diagnosis present

## 2017-06-25 DIAGNOSIS — N179 Acute kidney failure, unspecified: Secondary | ICD-10-CM | POA: Diagnosis present

## 2017-06-25 DIAGNOSIS — E870 Hyperosmolality and hypernatremia: Secondary | ICD-10-CM | POA: Diagnosis not present

## 2017-06-25 DIAGNOSIS — Z9911 Dependence on respirator [ventilator] status: Secondary | ICD-10-CM | POA: Diagnosis not present

## 2017-06-25 DIAGNOSIS — K5900 Constipation, unspecified: Secondary | ICD-10-CM | POA: Diagnosis not present

## 2017-06-25 DIAGNOSIS — R262 Difficulty in walking, not elsewhere classified: Secondary | ICD-10-CM | POA: Diagnosis not present

## 2017-06-25 DIAGNOSIS — M6281 Muscle weakness (generalized): Secondary | ICD-10-CM | POA: Diagnosis not present

## 2017-06-25 DIAGNOSIS — M25559 Pain in unspecified hip: Secondary | ICD-10-CM

## 2017-06-25 DIAGNOSIS — Z882 Allergy status to sulfonamides status: Secondary | ICD-10-CM

## 2017-06-25 LAB — CBC WITH DIFFERENTIAL/PLATELET
BASOS PCT: 0.1 % (ref 0.0–3.0)
Basophils Absolute: 0 10*3/uL (ref 0.0–0.1)
Basophils Absolute: 0 10*3/uL (ref 0.0–0.1)
Basophils Relative: 0 %
EOS PCT: 0 % (ref 0.0–5.0)
Eosinophils Absolute: 0 10*3/uL (ref 0.0–0.7)
Eosinophils Absolute: 0 10*3/uL (ref 0.0–0.7)
Eosinophils Relative: 0 %
HCT: 40.1 % (ref 36.0–46.0)
HCT: 37.9 % (ref 36.0–46.0)
Hemoglobin: 13.2 g/dL (ref 12.0–15.0)
Hemoglobin: 13 g/dL (ref 12.0–15.0)
Lymphocytes Relative: 6.3 % — ABNORMAL LOW (ref 12.0–46.0)
Lymphocytes Relative: 8 %
Lymphs Abs: 1.4 10*3/uL (ref 0.7–4.0)
Lymphs Abs: 1.8 10*3/uL (ref 0.7–4.0)
MCH: 28.8 pg (ref 26.0–34.0)
MCHC: 33 g/dL (ref 30.0–36.0)
MCHC: 34.3 g/dL (ref 30.0–36.0)
MCV: 84.9 fl (ref 78.0–100.0)
MCV: 84 fL (ref 78.0–100.0)
MONO ABS: 0.4 10*3/uL (ref 0.1–1.0)
Monocytes Absolute: 0.3 10*3/uL (ref 0.1–1.0)
Monocytes Relative: 1.6 % — ABNORMAL LOW (ref 3.0–12.0)
Monocytes Relative: 1 %
NEUTROS ABS: 20.9 10*3/uL — AB (ref 1.4–7.7)
Neutro Abs: 21.9 10*3/uL (ref 1.7–7.7)
Neutrophils Relative %: 92 % — ABNORMAL HIGH (ref 43.0–77.0)
Neutrophils Relative %: 91 %
Platelets: 179 10*3/uL (ref 150.0–400.0)
Platelets: UNDETERMINED 10*3/uL (ref 150–400)
RBC: 4.72 Mil/uL (ref 3.87–5.11)
RBC: 4.51 MIL/uL (ref 3.87–5.11)
RDW: 13.4 % (ref 11.5–15.5)
RDW: 14.1 % (ref 11.5–15.5)
WBC Morphology: INCREASED
WBC: 22.7 10*3/uL (ref 4.0–10.5)
WBC: 24 10*3/uL — ABNORMAL HIGH (ref 4.0–10.5)

## 2017-06-25 LAB — I-STAT CG4 LACTIC ACID, ED
Lactic Acid, Venous: 5.2 mmol/L (ref 0.5–1.9)
Lactic Acid, Venous: 5.3 mmol/L (ref 0.5–1.9)

## 2017-06-25 LAB — COMPREHENSIVE METABOLIC PANEL
ALT: 21 U/L (ref 0–35)
ALT: 25 U/L (ref 14–54)
AST: 33 U/L (ref 0–37)
AST: 51 U/L — ABNORMAL HIGH (ref 15–41)
Albumin: 3.7 g/dL (ref 3.5–5.2)
Albumin: 3.6 g/dL (ref 3.5–5.0)
Alkaline Phosphatase: 56 U/L (ref 39–117)
Alkaline Phosphatase: 57 U/L (ref 38–126)
Anion gap: 16 — ABNORMAL HIGH (ref 5–15)
BUN: 28 mg/dL — AB (ref 6–23)
BUN: 30 mg/dL — ABNORMAL HIGH (ref 6–20)
CHLORIDE: 92 meq/L — AB (ref 96–112)
CO2: 27 meq/L (ref 19–32)
CO2: 21 mmol/L — ABNORMAL LOW (ref 22–32)
Calcium: 9 mg/dL (ref 8.4–10.5)
Calcium: 8.5 mg/dL — ABNORMAL LOW (ref 8.9–10.3)
Chloride: 93 mmol/L — ABNORMAL LOW (ref 101–111)
Creatinine, Ser: 2.07 mg/dL — ABNORMAL HIGH (ref 0.40–1.20)
Creatinine, Ser: 2.33 mg/dL — ABNORMAL HIGH (ref 0.44–1.00)
GFR calc Af Amer: 21 mL/min — ABNORMAL LOW (ref >60)
GFR calc non Af Amer: 18 mL/min — ABNORMAL LOW (ref >60)
GFR: 24.14 mL/min — ABNORMAL LOW (ref >60.00)
GLUCOSE: 112 mg/dL — AB (ref 70–99)
Glucose, Bld: 107 mg/dL — ABNORMAL HIGH (ref 65–99)
POTASSIUM: 4.1 meq/L (ref 3.5–5.1)
Potassium: 4.1 mmol/L (ref 3.5–5.1)
SODIUM: 131 meq/L — AB (ref 135–145)
Sodium: 130 mmol/L — ABNORMAL LOW (ref 135–145)
Total Bilirubin: 1 mg/dL (ref 0.2–1.2)
Total Bilirubin: 0.8 mg/dL (ref 0.3–1.2)
Total Protein: 6.6 g/dL (ref 6.0–8.3)
Total Protein: 6.8 g/dL (ref 6.5–8.1)

## 2017-06-25 LAB — POC INFLUENZA A&B (BINAX/QUICKVUE)
Influenza A, POC: NEGATIVE
Influenza B, POC: NEGATIVE

## 2017-06-25 LAB — PROTIME-INR
INR: 1.3
Prothrombin Time: 16.1 seconds — ABNORMAL HIGH (ref 11.4–15.2)

## 2017-06-25 MED ORDER — FAMOTIDINE 20 MG PO TABS
20.0000 mg | ORAL_TABLET | Freq: Every day | ORAL | Status: DC
  Administered 2017-06-26 – 2017-06-27 (×2): 20 mg via ORAL
  Filled 2017-06-25 (×2): qty 1

## 2017-06-25 MED ORDER — CEFPODOXIME PROXETIL 200 MG PO TABS: 200.0000 mg | ORAL_TABLET | Freq: Two times a day (BID) | ORAL | 0 refills | Status: DC

## 2017-06-25 MED ORDER — VANCOMYCIN HCL 10 G IV SOLR
1500.0000 mg | Freq: Once | INTRAVENOUS | Status: AC
  Administered 2017-06-25: 1500 mg via INTRAVENOUS
  Filled 2017-06-25: qty 1500

## 2017-06-25 MED ORDER — SODIUM CHLORIDE 0.9 % IV BOLUS (SEPSIS)
1000.0000 mL | Freq: Once | INTRAVENOUS | Status: AC
  Administered 2017-06-25: 1000 mL via INTRAVENOUS

## 2017-06-25 MED ORDER — AZITHROMYCIN 500 MG IV SOLR
500.0000 mg | Freq: Once | INTRAVENOUS | Status: AC
  Administered 2017-06-25: 500 mg via INTRAVENOUS
  Filled 2017-06-25: qty 500

## 2017-06-25 MED ORDER — SODIUM CHLORIDE 0.9 % IV SOLN
500.0000 mg | INTRAVENOUS | Status: DC
  Administered 2017-06-26: 500 mg via INTRAVENOUS
  Filled 2017-06-25 (×2): qty 500

## 2017-06-25 MED ORDER — CEFTRIAXONE SODIUM 1 G IJ SOLR
1.0000 g | Freq: Once | INTRAMUSCULAR | Status: AC
  Administered 2017-06-25: 1 g via INTRAMUSCULAR

## 2017-06-25 MED ORDER — SODIUM CHLORIDE 0.9 % IV SOLN
INTRAVENOUS | Status: DC
  Administered 2017-06-25 – 2017-06-30 (×8): via INTRAVENOUS

## 2017-06-25 MED ORDER — SODIUM CHLORIDE 0.9 % IV SOLN
1.0000 g | INTRAVENOUS | Status: DC
  Administered 2017-06-26: 1 g via INTRAVENOUS
  Filled 2017-06-25: qty 10
  Filled 2017-06-25: qty 1

## 2017-06-25 MED ORDER — AZITHROMYCIN 250 MG PO TABS: ORAL_TABLET | ORAL | 0 refills | Status: DC

## 2017-06-25 MED ORDER — CALCIUM-MAGNESIUM-VITAMIN D ER 600-40-500 MG-MG-UNIT PO TB24: 1.0000 | ORAL_TABLET | Freq: Every day | ORAL | Status: DC

## 2017-06-25 MED ORDER — IPRATROPIUM-ALBUTEROL 0.5-2.5 (3) MG/3ML IN SOLN
3.0000 mL | Freq: Once | RESPIRATORY_TRACT | Status: AC
  Administered 2017-06-25: 3 mL via RESPIRATORY_TRACT
  Filled 2017-06-25: qty 3

## 2017-06-25 MED ORDER — LATANOPROST 0.005 % OP SOLN
1.0000 [drp] | Freq: Every day | OPHTHALMIC | Status: DC
  Administered 2017-06-26 – 2017-07-08 (×13): 1 [drp] via OPHTHALMIC
  Filled 2017-06-25: qty 2.5

## 2017-06-25 MED ORDER — POLYETHYLENE GLYCOL 3350 17 G PO PACK: 17.0000 g | PACK | Freq: Every day | ORAL | Status: DC | PRN

## 2017-06-25 MED ORDER — HYDROCORTISONE NA SUCCINATE PF 100 MG IJ SOLR
50.0000 mg | Freq: Three times a day (TID) | INTRAMUSCULAR | Status: DC
  Administered 2017-06-26: 50 mg via INTRAVENOUS
  Filled 2017-06-25: qty 2

## 2017-06-25 MED ORDER — ACETAMINOPHEN 325 MG PO TABS
650.0000 mg | ORAL_TABLET | Freq: Once | ORAL | Status: AC
  Administered 2017-06-25: 650 mg via ORAL
  Filled 2017-06-25: qty 2

## 2017-06-25 MED ORDER — ENOXAPARIN SODIUM 40 MG/0.4ML ~~LOC~~ SOLN
40.0000 mg | SUBCUTANEOUS | Status: DC
  Administered 2017-06-25: 40 mg via SUBCUTANEOUS
  Filled 2017-06-25: qty 0.4

## 2017-06-25 MED ORDER — PANTOPRAZOLE SODIUM 20 MG PO TBEC
20.0000 mg | DELAYED_RELEASE_TABLET | Freq: Every day | ORAL | Status: DC
  Administered 2017-06-26 – 2017-06-27 (×2): 20 mg via ORAL
  Filled 2017-06-25 (×2): qty 1

## 2017-06-25 MED ORDER — SODIUM CHLORIDE 0.9 % IV SOLN
1.0000 g | Freq: Once | INTRAVENOUS | Status: AC
  Administered 2017-06-25: 1 g via INTRAVENOUS
  Filled 2017-06-25: qty 10

## 2017-06-25 MED ORDER — ADULT MULTIVITAMIN W/MINERALS CH
1.0000 | ORAL_TABLET | Freq: Every day | ORAL | Status: DC
  Administered 2017-06-26 – 2017-06-27 (×2): 1 via ORAL
  Filled 2017-06-25 (×2): qty 1

## 2017-06-25 MED ORDER — OXYCODONE HCL 5 MG PO TABS
5.0000 mg | ORAL_TABLET | Freq: Once | ORAL | Status: AC | PRN
  Administered 2017-06-25: 5 mg via ORAL
  Filled 2017-06-25: qty 1

## 2017-06-25 MED ORDER — ACETAMINOPHEN 325 MG PO TABS: 650.0000 mg | ORAL_TABLET | Freq: Four times a day (QID) | ORAL | Status: DC | PRN

## 2017-06-25 MED ORDER — CALCIUM CARBONATE-VITAMIN D 500-200 MG-UNIT PO TABS
1.0000 | ORAL_TABLET | Freq: Every day | ORAL | Status: DC
  Administered 2017-06-27: 1 via ORAL
  Filled 2017-06-25 (×2): qty 1

## 2017-06-25 MED ORDER — HYDROCORTISONE NA SUCCINATE PF 100 MG IJ SOLR
100.0000 mg | Freq: Once | INTRAMUSCULAR | Status: AC
  Administered 2017-06-25: 100 mg via INTRAVENOUS
  Filled 2017-06-25: qty 2

## 2017-06-25 NOTE — Progress Notes (Signed)
Pharmacy Antibiotic Note  Tamara Robertson is a 84 y.o. female admitted on 06/25/2017 with pneumonia.  Pharmacy has been consulted for ceftriaxone and azithromycin dosing.  Plan: Ceftriaxone 1gm IV q24h azithromycin 1gm IV q24h Pharmacy will sign off as no adjustment needed in renal dysfunction  Height: 5\' 2"  (157.5 cm) Weight: 140 lb (63.5 kg) IBW/kg (Calculated) : 50.1  Temp (24hrs), Avg:98.4 F (36.9 C), Min:98.2 F (36.8 C), Max:98.5 F (36.9 C)  Recent Labs  Lab 06/25/17 1614 06/25/17 2025  WBC 22.7*  --   CREATININE 2.07*  --   LATICACIDVEN  --  5.20*    Estimated Creatinine Clearance: 17.4 mL/min (A) (by C-G formula based on SCr of 2.07 mg/dL (H)).    Allergies  Allergen Reactions  . Aspirin Other (See Comments)    ITP  . Sulfa Antibiotics Other (See Comments)    dizziness  . Contrast Media [Iodinated Diagnostic Agents] Hives  . Lasix [Furosemide] Rash  . Statins Other (See Comments)     Thank you for allowing pharmacy to be a part of this patient's care.  Dolly Rias RPh 06/25/2017, 8:44 PM Pager [phone]

## 2017-06-25 NOTE — Assessment & Plan Note (Signed)
Likely pneumonia Chest x-ray, CBC, CMP, lactic acid Tylenol alternating with Advil/Aleve

## 2017-06-25 NOTE — ED Notes (Signed)
Notified EDP,Kohut,MD. Pt. I-stat CG4 Lactic acid results 5.20 and RN,Kelly made aware.

## 2017-06-25 NOTE — ED Notes (Signed)
Bed: WA22 Expected date:  Expected time:  Means of arrival:  Comments: Hold for triage 1 

## 2017-06-25 NOTE — ED Notes (Signed)
Admitting, gardner MD aware of pt repeat lactic level is still elevated at this time. Pt is also still c/o left hip pain after admin of oxy/tylenol dosages. He is aware of the same, at the pt bedside.

## 2017-06-25 NOTE — Patient Instructions (Addendum)
Hold lasix for now.     Have blood work and a chest xray today.    You received a injection of an antibiotic today.      zpak - start tonight.  vantin start tomorrow.     Increase your steroids to 10 mg daily for 7 days then decrease to 5 mg for 7 days then decrease back to 2.5 mg daily.    Call tomorrow with an update

## 2017-06-25 NOTE — Assessment & Plan Note (Signed)
Symptoms and exam consistent with community-acquired pneumonia Will get a chest x-ray today stat CBC, CMP, lactic acid She was referred to avoid the hospital, but will need to go to the hospital in the next 24 hours if she is not doing any better or possibly depending on her above blood work/chest x-ray Ceftriaxone 1 g IM now Start azithromycin today, start Vantin tomorrow On chronic steroids and acutely ill.  She does have some hypotension-increase prednisone to 10 mg daily for 7 days, then 5 mg daily for 1 week then decrease back to 2.5 mg daily Stressed increasing fluids Will call later tonight with the results and discuss if she needs to go to the emergency room, but family will have a very low threshold for taking her to the emergency room

## 2017-06-25 NOTE — Telephone Encounter (Signed)
Pt   Developed  Weakness  Yesterday    Had   Some  Fever  Last  Pm  .  Fever is  Better   Pt is awake  And  Alert  Daughter  Is  At  Bedside . No  Vomiting   Some  Nausea Appt made  Today  With  Dr  Quay Burow  Daughter  Advised  To drive the patient  To the  Office Call  911  Or acute  Distress   Today Give pt  Fluids  / tylenol   Reason for Disposition . [1] MODERATE weakness (i.e., interferes with work, school, normal activities) AND [2] cause unknown  (Exceptions: weakness with acute minor illness, or weakness from poor fluid intake) . Patient sounds very sick or weak to the triager  Answer Assessment - Initial Assessment Questions 1. DESCRIPTION: "Describe how you are feeling."       Weak    2. SEVERITY: "How bad is it?"  "Can you stand and walk?"   - MILD - Feels weak or tired, but does not interfere with work, school or normal activities   - [place] to stand and walk; weakness interferes with work, school, or normal activities   - SEVERE - Unable to stand or walk      Severe   3. ONSET:  "When did the weakness begin?"       Last  Night   4. CAUSE: "What do you think is causing the weakness?"       Fever     5. MEDICINES: "Have you recently started a new medicine or had a change in the amount of a medicine?"        none 6. OTHER SYMPTOMS: "Do you have any other symptoms?" (e.g., chest pain, fever, cough, SOB, vomiting, diarrhea, bleeding)    Fever    Body  Aches   Diarrhea    Nausea   Earlier   7. PREGNANCY: "Is there any chance you are pregnant?" "When was your last menstrual period?"     n/a  Protocols used: WEAKNESS (GENERALIZED) AND FATIGUE-A-AH

## 2017-06-25 NOTE — ED Notes (Signed)
Pt aware that a urine sample is needed.  Pt has a Purwik placed.  Will obtain sample when it is available

## 2017-06-25 NOTE — Progress Notes (Signed)
Subjective:    Patient ID: Tamara Robertson, female    DOB: 1930/03/26, 84 y.o.   MRN: [id number]  HPI The patient is here for an acute visit.  She is here with her daughter.  Her symptoms started last night.  Just before 8:00 last night she started experiencing back pain.  She does have a chronic, intermittent back pain and this pain was no different.  She took Aleve and was using a heating pad.  One hour later the pain was severe and she had difficulty getting out of her chair.  She called her daughter and her daughter and son-in-law came over to help her.  She was freezing, shaking and had the chills.  Her temperature at that time was 101.3.  Not long after that it was 102.2.  They did call EMS and they evaluated her.  They thought she might have the flu.  She decided not to go to the emergency room.  She was alternating Tylenol and Aleve and her temperature this morning was 99.9 and then later 97.7.  Besides the chills and fevers she does state some mild chest tightness, slight shortness of breath and dry cough.  She has had some nausea at times and some diarrhea this morning.  She states some lightheadedness/dizziness and her daughter states there has been some confusion.  Her back pain is chronic and she states it is not changed from previous.  She is not experiencing any urinary symptoms, wheezing, abdominal pain, nasal congestion, sore throat, ear pain or sinus pain.  She has not taken anything other than the Tylenol and Aleve.  She is drinking some water, but eating minimally.  She would prefer to go home, but would go to the hospital if needed.   Medications and allergies reviewed with patient and updated if appropriate.  Patient Active Problem List   Diagnosis Date Noted  . Chronic lower back pain 05/14/2017  . Varicose veins of left lower extremity with complications 03/05/2017  . Bilateral hearing loss 02/01/2017  . Varicose veins of both lower extremities 09/20/2016  .  Hypertension 07/31/2016  . Leg edema 07/31/2016  . Prediabetes 06/05/2016  . Osteopenia 12/01/2015  . Anxiety 07/19/2015  . Degenerative cervical disc 12/28/2014  . Left ovarian cyst   . Peripheral neuropathy 06/03/2012  . Insomnia   . PVD (peripheral vascular disease) (HCC) 11/23/2010  . Bursitis of hip 10/26/2010  . Hyperlipidemia 06/27/2010  . IMMUNE THROMBOCYTOPENIC PURPURA 06/27/2010  . GLUCOMA 06/27/2010  . GERD 06/27/2010  . IRRITABLE BOWEL SYNDROME 06/27/2010  . OVERACTIVE BLADDER 06/27/2010  . OSTEOARTHRITIS, KNEE, RIGHT 06/27/2010  . URINARY INCONTINENCE 06/27/2010    Current Outpatient Medications on File Prior to Visit  Medication Sig Dispense Refill  . Biotin 2500 MCG CAPS Take by mouth daily.    . Calcium-Magnesium-Vitamin D (CITRACAL CALCIUM+D) 600-40-500 MG-MG-UNIT TB24 Take 1 tablet by mouth daily.     . clotrimazole-betamethasone (LOTRISONE) cream Apply 1 application topically 2 (two) times daily as needed. 15 g 1  . Flaxseed MISC by Does not apply route. Take 1 tsp daily    . furosemide (LASIX) 20 MG tablet Take 1 tablet (20 mg total) by mouth daily. 90 tablet 3  . latanoprost (XALATAN) 0.005 % ophthalmic solution Place 1 drop into both eyes at bedtime.    . Multiple Vitamin (MULTIVITAMIN) tablet Take 1 tablet by mouth daily.      . pantoprazole (PROTONIX) 20 MG tablet TAKE 1 TABLET BY MOUTH ONCE DAILY  90 tablet 2  . polyethylene glycol (MIRALAX / GLYCOLAX) packet Take 17 g by mouth every 3 (three) days.    . predniSONE (DELTASONE) 2.5 MG tablet TAKE ONE TABLET BY MOUTH ONCE DAILY 90 tablet 3  . Probiotic Product (PROBIOTIC FORMULA PO) Take 1 tablet by mouth daily.      . ranitidine (ZANTAC) 150 MG tablet Take 1 tab by mouth every morning. 30 tablet 6   No current facility-administered medications on file prior to visit.     Past Medical History:  Diagnosis Date  . Anxiety   . CAD (coronary artery disease)    RCA 40% stenosis cath 01/2011  .  Cholelithiasis    s/p lap chole 09/2014  . Diverticular stricture [facility]) 2006   [facility]  . DIVERTICULITIS, HX OF   . Diverticulosis   . DYSLIPIDEMIA   . Elevated LFTs   . GERD   . Glaucoma   . Hepatic steatosis   . HOH (hard of hearing)   . Immune thrombocytopenic purpura (HCC)    chronic - baseline 80-100K, on pred  . Irritable bowel syndrome   . Left ovarian cyst dx 01/2013 CT   working with gyn, ?malignant - elevated tumor marker OVA1  . OSTEOARTHRITIS, KNEE, RIGHT   . OSTEOPENIA   . OVERACTIVE BLADDER   . UNSPECIFIED PERIPHERAL VASCULAR DISEASE   . URINARY INCONTINENCE     Past Surgical History:  Procedure Laterality Date  . ABDOMINAL HYSTERECTOMY  1963  . ANGIOPLASTY    . APPENDECTOMY  1956  . CARDIAC CATHETERIZATION    . CATARACT EXTRACTION, BILATERAL  10/2010  . CHOLECYSTECTOMY N/A 09/16/2014   Procedure: LAPAROSCOPIC CHOLECYSTECTOMY ;  Surgeon: Emelia Loron, MD;  Location[facility] OR;  Service: General;  Laterality: N/A;  . KNEE ARTHROSCOPY Right   . L pop PTA  10/2009   stent  . LAPAROSCOPIC SIGMOID COLECTOMY  10/2005  . SPLENECTOMY  1954  . VARICOSE VEIN SURGERY Right 1962    Social History   Socioeconomic History  . Marital status: Widowed    Spouse name: None  . Number of children: None  . Years of education: None  . Highest education level: None  Social Needs  . Financial resource strain: None  . Food insecurity - worry: None  . Food insecurity - inability: None  . Transportation needs - medical: None  . Transportation needs - non-medical: None  Occupational History  . Occupation: Retired    Comment: Programmer, multimedia  Tobacco Use  . Smoking status: Never Smoker  . Smokeless tobacco: Never Used  Substance and Sexual Activity  . Alcohol use: No    Alcohol/week: 0.0 oz    Comment: rarely  . Drug use: No  . Sexual activity: None  Other Topics Concern  . 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    [place] to [place] from [place] 05/2010 to be close to kids    Family History  Problem Relation Age of Onset  . Coronary artery disease Mother   . Heart attack Mother 59  . Hyperlipidemia Mother   . Hypertension Mother   . Stomach cancer Father   . Hypertension Daughter   . Hyperlipidemia Daughter   . Arthritis Unknown        parent  . Transient ischemic attack Unknown        parent  . Colon cancer Neg Hx  Review of Systems  Constitutional: Positive for chills and fever.  HENT: Negative for congestion, ear pain, sinus pain and sore throat.   Respiratory: Positive for cough (dry), chest tightness (little) and shortness of breath. Negative for wheezing.   Gastrointestinal: Positive for diarrhea (this morning) and nausea (3-4 times, dry heaves). Negative for abdominal pain and vomiting.  Genitourinary: Negative for dysuria, frequency and hematuria.  Musculoskeletal: Positive for back pain (chronic) and neck pain.  Neurological: Positive for dizziness and light-headedness. Negative for headaches.  Psychiatric/Behavioral: Positive for confusion.       Objective:   Vitals:   06/25/17 1523  BP: (!) 96/54  Pulse: 97  Resp: 16  Temp: 98.2 F (36.8 C)  SpO2: 94%   Wt Readings from Last 3 Encounters:  06/25/17 146 lb (66.2 kg)  05/14/17 144 lb (65.3 kg)  04/17/17 143 lb 9.6 oz (65.1 kg)   Body mass index is 26.7 kg/m.   Physical Exam  Constitutional:  Appears moderately ill, nondiaphoretic and in no acute distress  HENT:  Head: Normocephalic and atraumatic.  Right Ear: External ear normal.  Left Ear: External ear normal.  Mouth/Throat: Oropharynx is clear and moist. No oropharyngeal exudate.  Bilateral ear canals and tympanic membranes normal  Eyes: Conjunctivae are normal.  Neck: Neck supple. No tracheal deviation present. No thyromegaly present.  Cardiovascular: Normal rate and regular rhythm.  Pulmonary/Chest: Effort normal. No  respiratory distress. She has no wheezes. She has rales (Right lower lung).  Right lateral chest pain with deep breaths  Abdominal: Soft. She exhibits no distension. There is no tenderness. There is no rebound and no guarding.  Musculoskeletal: She exhibits no edema.  Lymphadenopathy:    She has no cervical adenopathy.  Skin: Skin is warm and dry. No erythema.           Assessment & Plan:    See Problem List for Assessment and Plan of chronic medical problems.

## 2017-06-25 NOTE — Assessment & Plan Note (Signed)
She appears to be dehydrated-she is dry appearing and is hypotensive Stressed increasing fluids May need to go to the emergency room for IV fluids and further treatment of probable pneumonia

## 2017-06-25 NOTE — ED Triage Notes (Signed)
Patient was seen by her PCP today and was diagnosed with right mid and lower lobe. WBC-22.5. Patient had a fever last PM of 102.0 orally

## 2017-06-25 NOTE — Progress Notes (Signed)
Pharmacy Antibiotic Note  Tamara Robertson is a 84 y.o. female admitted on 06/25/2017 with pneumonia.  Pharmacy has been consulted for ceftriaxone,  azithromycin and vancomycin dosing.  Plan: Ceftriaxone 1gm IV q24h azithromycin 500 mg IV q24h Vancomycin 1500 mg x1 now Goal AUC=400-500 F/u scr/cultures/levels Will f/u with am Scr before ordering additional doses  Height: 5\' 2"  (157.5 cm) Weight: 140 lb (63.5 kg) IBW/kg (Calculated) : 50.1  Temp (24hrs), Avg:98.4 F (36.9 C), Min:98.2 F (36.8 C), Max:98.5 F (36.9 C)  Recent Labs  Lab 06/25/17 1614 06/25/17 2006 06/25/17 2025 06/25/17 2211  WBC 22.7* 24.0*  --   --   CREATININE 2.07* 2.33*  --   --   LATICACIDVEN  --   --  5.20* 5.30*    Estimated Creatinine Clearance: 15.5 mL/min (A) (by C-G formula based on SCr of 2.33 mg/dL (H)).    Allergies  Allergen Reactions  . Aspirin Other (See Comments)    ITP  . Sulfa Antibiotics Other (See Comments)    dizziness  . Contrast Media [Iodinated Diagnostic Agents] Hives  . Lasix [Furosemide] Rash  . Statins Other (See Comments)     Thank you for allowing pharmacy to be a part of this patient's care.  Dorrene German 06/25/2017, 11:05 PM

## 2017-06-25 NOTE — H&P (Addendum)
History and Physical    ONYINYE MORALIS ZOX:[id number] DOB: [birth year] DOA: 06/25/2017  PCP: Pincus Sanes, MD  Patient coming from: Home  I have personally briefly reviewed patient's old medical records in [facility]  Chief Complaint: Pneumonia  HPI: Tamara Robertson is a 84 y.o. female with medical history significant of CAD, ITP on chronic 2.5mg  daily prednisone. Patient presented to PCPs office earlier today with c/o back pain, cough, fever, chills, nausea, diarrhea.  Tm at home 102.2.  EMS called, patient decided not to go to ER at that time.  Went to PCPs office today where RLL PNA diagnosed,    ED Course: CXR shows RLL PNA.  Initial BPs as low as 77 systolic here in ED.  Improved to 100-110 systolic after 47ml/kg NS bolus.  Creat 2.3 up from 0.8 baseline.  Lactate 5.2 initially.  WBC 24k.  Given rocephin / azithromycin.  30ml / kg bolus.   Review of Systems: As per HPI otherwise 10 point review of systems negative.   Past Medical History:  Diagnosis Date  . Anxiety   . CAD (coronary artery disease)    RCA 40% stenosis cath 01/2011  . Cholelithiasis    s/p lap chole 09/2014  . Diverticular stricture [facility]) 2006   [facility]  . DIVERTICULITIS, HX OF   . Diverticulosis   . DYSLIPIDEMIA   . Elevated LFTs   . GERD   . Glaucoma   . Hepatic steatosis   . HOH (hard of hearing)   . Immune thrombocytopenic purpura (HCC)    chronic - baseline 80-100K, on pred  . Irritable bowel syndrome   . Left ovarian cyst dx 01/2013 CT   working with gyn, ?malignant - elevated tumor marker OVA1  . OSTEOARTHRITIS, KNEE, RIGHT   . OSTEOPENIA   . OVERACTIVE BLADDER   . UNSPECIFIED PERIPHERAL VASCULAR DISEASE   . URINARY INCONTINENCE     Past Surgical History:  Procedure Laterality Date  . ABDOMINAL HYSTERECTOMY  1963  . ANGIOPLASTY    . APPENDECTOMY  1956  . CARDIAC CATHETERIZATION    . CATARACT EXTRACTION, BILATERAL  10/2010  . CHOLECYSTECTOMY N/A 09/16/2014    Procedure: LAPAROSCOPIC CHOLECYSTECTOMY ;  Surgeon: Emelia Loron, MD;  Location[facility] OR;  Service: General;  Laterality: N/A;  . KNEE ARTHROSCOPY Right   . L pop PTA  10/2009   stent  . LAPAROSCOPIC SIGMOID COLECTOMY  10/2005  . SPLENECTOMY  1954  . VARICOSE VEIN SURGERY Right 1962     reports that  has never smoked. she has never used smokeless tobacco. She reports that she does not drink alcohol or use drugs.  Allergies  Allergen Reactions  . Aspirin Other (See Comments)    ITP  . Sulfa Antibiotics Other (See Comments)    dizziness  . Contrast Media [Iodinated Diagnostic Agents] Hives  . Lasix [Furosemide] Rash  . Statins Other (See Comments)    Family History  Problem Relation Age of Onset  . Coronary artery disease Mother   . Heart attack Mother 35  . Hyperlipidemia Mother   . Hypertension Mother   . Stomach cancer Father   . Hypertension Daughter   . Hyperlipidemia Daughter   . Arthritis Unknown        parent  . Transient ischemic attack Unknown        parent  . Colon cancer Neg Hx      Prior to Admission medications   Medication Sig Start  Date End Date Taking? Authorizing Provider  acetaminophen (TYLENOL) 325 MG tablet Take 650 mg by mouth every 6 (six) hours as needed for fever.   Yes [provider]  Biotin 2500 MCG CAPS Take 1 capsule by mouth daily.    Yes [provider]  Calcium-Magnesium-Vitamin D (CITRACAL CALCIUM+D) 600-40-500 MG-MG-UNIT TB24 Take 1 tablet by mouth daily.    Yes [provider]  Flaxseed MISC Take 5 mLs by mouth daily. Take 1 tsp daily   Yes [provider]  furosemide (LASIX) 20 MG tablet Take 1 tablet (20 mg total) by mouth daily. 09/26/16  Yes Marykay Lex, MD  latanoprost (XALATAN) 0.005 % ophthalmic solution Place 1 drop into both eyes at bedtime.   Yes [provider]  Multiple Vitamin (MULTIVITAMIN) tablet Take 1 tablet by mouth daily.     Yes [provider]  pantoprazole  (PROTONIX) 20 MG tablet TAKE 1 TABLET BY MOUTH ONCE DAILY 04/08/17  Yes Pyrtle, Carie Caddy, MD  polyethylene glycol (MIRALAX / GLYCOLAX) packet Take 17 g by mouth daily as needed for moderate constipation.    Yes [provider]  predniSONE (DELTASONE) 2.5 MG tablet TAKE ONE TABLET BY MOUTH ONCE DAILY 12/31/16  Yes Burns, Bobette Mo, MD  Probiotic Product (PROBIOTIC FORMULA PO) Take 1 tablet by mouth daily.     Yes [provider]  ranitidine (ZANTAC) 150 MG tablet Take 1 tab by mouth every morning. 02/12/17  Yes Esterwood, Amy S, PA-C  azithromycin (ZITHROMAX) 250 MG tablet Take two tabs the first day and then one tab daily for four days 06/25/17   Pincus Sanes, MD  cefpodoxime (VANTIN) 200 MG tablet Take 1 tablet (200 mg total) by mouth 2 (two) times daily. 06/25/17   Pincus Sanes, MD  clotrimazole-betamethasone (LOTRISONE) cream Apply 1 application topically 2 (two) times daily as needed. Patient taking differently: Apply 1 application topically 2 (two) times daily as needed (fungal infection).  03/16/15   Newt Lukes, MD    Physical Exam: Vitals:   06/25/17 2005 06/25/17 2014 06/25/17 2050 06/25/17 2110  BP: (!) 100/51   113/77  Pulse: 97   (!) 104  Resp: 18   (!) 25  Temp: 98.5 F (36.9 C)     TempSrc: Oral     SpO2: 90%  93% 92%  Weight:  63.5 kg (140 lb)    Height:  5\' 2"  (1.575 m)      Constitutional: NAD, calm, comfortable Eyes: PERRL, lids and conjunctivae normal ENMT: Mucous membranes are moist. Posterior pharynx clear of any exudate or lesions.Normal dentition.  Neck: normal, supple, no masses, no thyromegaly Respiratory: Wet sounding cough, RLL rales Cardiovascular: Regular rate and rhythm, no murmurs / rubs / gallops. No extremity edema. 2+ pedal pulses. No carotid bruits.  Abdomen: no tenderness, no masses palpated. No hepatosplenomegaly. Bowel sounds positive.  Musculoskeletal: no clubbing / cyanosis. No joint deformity upper and lower extremities. Good  ROM, no contractures. Normal muscle tone.  Skin: no rashes, lesions, ulcers. No induration Neurologic: CN 2-12 grossly intact. Sensation intact, DTR normal. Strength 5/5 in all 4.  Psychiatric: Normal judgment and insight. Alert and oriented x 3. Normal mood.    Labs on Admission: I have personally reviewed following labs and imaging studies  CBC: Recent Labs  Lab 06/25/17 1614 06/25/17 2006  WBC 22.7* 24.0*  NEUTROABS 20.9* 21.9  HGB 13.2 13.0  HCT 40.1 37.9  MCV 84.9 84.0  PLT 179.0 PLATELET  CLUMPS NOTED ON SMEAR, UNABLE TO ESTIMATE   Basic Metabolic Panel: Recent Labs  Lab 06/25/17 1614 06/25/17 2006  NA 131* 130*  K 4.1 4.1  CL 92* 93*  CO2 27 21*  GLUCOSE 112* 107*  BUN 28* 30*  CREATININE 2.07* 2.33*  CALCIUM 9.0 8.5*   GFR: Estimated Creatinine Clearance: 15.5 mL/min (A) (by C-G formula based on SCr of 2.33 mg/dL (H)). Liver Function Tests: Recent Labs  Lab 06/25/17 1614 06/25/17 2006  AST 33 51*  ALT 21 25  ALKPHOS 56 57  BILITOT 1.0 0.8  PROT 6.6 6.8  ALBUMIN 3.7 3.6   No results for input(s): LIPASE, AMYLASE in the last 168 hours. No results for input(s): AMMONIA in the last 168 hours. Coagulation Profile: Recent Labs  Lab 06/25/17 2006  INR 1.30   Cardiac Enzymes: No results for input(s): CKTOTAL, CKMB, CKMBINDEX, TROPONINI in the last 168 hours. BNP (last 3 results) No results for input(s): PROBNP in the last 8760 hours. HbA1C: No results for input(s): HGBA1C in the last 72 hours. CBG: No results for input(s): GLUCAP in the last 168 hours. Lipid Profile: No results for input(s): CHOL, HDL, LDLCALC, TRIG, CHOLHDL, LDLDIRECT in the last 72 hours. Thyroid Function Tests: No results for input(s): TSH, T4TOTAL, FREET4, T3FREE, THYROIDAB in the last 72 hours. Anemia Panel: No results for input(s): VITAMINB12, FOLATE, FERRITIN, TIBC, IRON, RETICCTPCT in the last 72 hours. Urine analysis:    Component Value Date/Time   COLORURINE YELLOW  04/26/2016 1121   APPEARANCEUR CLEAR 04/26/2016 1121   LABSPEC <=1.005 (A) 04/26/2016 1121   PHURINE 6.0 04/26/2016 1121   GLUCOSEU NEGATIVE 04/26/2016 1121   HGBUR NEGATIVE 04/26/2016 1121   BILIRUBINUR NEGATIVE 04/26/2016 1121   KETONESUR NEGATIVE 04/26/2016 1121   PROTEINUR 30 (A) 11/16/2013 1025   UROBILINOGEN 0.2 04/26/2016 1121   NITRITE NEGATIVE 04/26/2016 1121   LEUKOCYTESUR NEGATIVE 04/26/2016 1121    Radiological Exams on Admission: Dg Chest 2 View  Result Date: 06/25/2017 CLINICAL DATA:  Fever and cough. Left-sided posterior chest pain and fatigue x2 days. EXAM: CHEST  2 VIEW COMPARISON:  None. FINDINGS: Normal heart size with aortic atherosclerosis. No aneurysm. New pulmonary consolidation at the right lung base localizing predominantly within the right lower lobe and possibly portions of the right middle lobe. No significant effusion. No pneumothorax. No acute osseous abnormality. Degenerative changes are seen along the dorsal spine. Cholecystectomy clips are present in the upper abdomen. IMPRESSION: 1. Pneumonic consolidation at the right lung base localizing predominantly within the right lower lobe and possibly within portions of the right middle lobe. These results will be called to the ordering clinician or representative by the Radiologist Assistant, and communication documented in the PACS or zVision Dashboard. 2. Aortic atherosclerosis. Electronically Signed   By: Tollie Eth M.D.   On: 06/25/2017 16:42    EKG: Independently reviewed.  Assessment/Plan Principal Problem:   Community acquired pneumonia of right lower lobe of lung (HCC) Active Problems:   Severe sepsis with acute organ dysfunction (HCC)   AKI (acute kidney injury) (HCC)   Current chronic use of systemic steroids    1. RLL CAP - 1. PNA pathway 2. Rocephin / azithromycin 3. Cultures pending 4. Repeat CBC in AM 5. Influenza neg in office 2. Severe sepsis with AKI - 1. IVF: 60ml/kg bolus and  100cc/hr NS 2. Repeat BMP in AM 3. Strict intake and output 4. Initiating stress dose steroids given chronic steroid use 1. Cortisol level if this  can be drawn before steroids  DVT prophylaxis: Lovenox Code Status: Full Family Communication: Family at bedside Disposition Plan: Home after admit Consults called: None Admission status: Admit to inpatient   Hillary Bow DO [facility] Pager [phone]  If 7AM-7PM, please contact day team taking care of patient [email or web address] Password [email or web address]  06/25/2017, 9:35 PM

## 2017-06-25 NOTE — ED Provider Notes (Signed)
[facility] DEPT Provider Note   CSN: [id number] Arrival date & time: 06/25/17  2841     History   Chief Complaint Chief Complaint  Patient presents with  . Abnormal Lab    WBC-22.5  . Pneumonia    HPI Tamara Robertson is a 84 y.o. female.  The history is provided by the patient and medical records. No language interpreter was used.  Abnormal Lab  Pneumonia  Pertinent negatives include no chest pain and no shortness of breath.   Tamara Robertson is a 84 y.o. female  with a PMH of ITP, HTN, HLD, CAD who presents to the Emergency Department complaining of fever up to 102.2 oral which began last night.  Associated with right-sided back pain, chills and dry cough.  She took Tylenol and Aleve which brought temperature down, but just spiked right back up again.  She was seen by her PCP who ordered x-ray and labs as an outpatient.  X-ray revealed right lower lobe pneumonia.  She was given 1 g IM ceftriaxone and prescription to start azithromycin and Vantin.  Labs returned and notable for leukocytosis of 22.  Given she is on chronic steroids with this elevated of leukocytosis and febrile, she was informed to come to the emergency department for further evaluation.  Tested for flu PCP which was negative.  Past Medical History:  Diagnosis Date  . Anxiety   . CAD (coronary artery disease)    RCA 40% stenosis cath 01/2011  . Cholelithiasis    s/p lap chole 09/2014  . Diverticular stricture [facility]) 2006   [facility]  . DIVERTICULITIS, HX OF   . Diverticulosis   . DYSLIPIDEMIA   . Elevated LFTs   . GERD   . Glaucoma   . Hepatic steatosis   . HOH (hard of hearing)   . Immune thrombocytopenic purpura (HCC)    chronic - baseline 80-100K, on pred  . Irritable bowel syndrome   . Left ovarian cyst dx 01/2013 CT   working with gyn, ?malignant - elevated tumor marker OVA1  . OSTEOARTHRITIS, KNEE, RIGHT   . OSTEOPENIA   . OVERACTIVE BLADDER   .  UNSPECIFIED PERIPHERAL VASCULAR DISEASE   . URINARY INCONTINENCE     Patient Active Problem List   Diagnosis Date Noted  . Dehydration 06/25/2017  . Community acquired pneumonia of right lower lobe of lung ([place]) 06/25/2017  . Fever 06/25/2017  . Cough 06/25/2017  . Sepsis ([place]) 06/25/2017  . AKI (acute kidney injury) ([place]) 06/25/2017  . Current chronic use of systemic steroids 06/25/2017  . Chronic lower back pain 05/14/2017  . Varicose veins of left lower extremity with complications 32/44/0102  . Bilateral hearing loss 02/01/2017  . Varicose veins of both lower extremities 09/20/2016  . Hypertension 07/31/2016  . Leg edema 07/31/2016  . Prediabetes 06/05/2016  . Osteopenia 12/01/2015  . Anxiety 07/19/2015  . Degenerative cervical disc 12/28/2014  . Left ovarian cyst   . Peripheral neuropathy 06/03/2012  . Insomnia   . PVD (peripheral vascular disease) ([place]) 11/23/2010  . Bursitis of hip 10/26/2010  . Hyperlipidemia 06/27/2010  . IMMUNE THROMBOCYTOPENIC PURPURA 06/27/2010  . GLUCOMA 06/27/2010  . GERD 06/27/2010  . IRRITABLE BOWEL SYNDROME 06/27/2010  . OVERACTIVE BLADDER 06/27/2010  . OSTEOARTHRITIS, KNEE, RIGHT 06/27/2010  . URINARY INCONTINENCE 06/27/2010    Past Surgical History:  Procedure Laterality Date  . ABDOMINAL HYSTERECTOMY  1963  . ANGIOPLASTY    . APPENDECTOMY  1956  .  CARDIAC CATHETERIZATION    . CATARACT EXTRACTION, BILATERAL  10/2010  . CHOLECYSTECTOMY N/A 09/16/2014   Procedure: LAPAROSCOPIC CHOLECYSTECTOMY ;  Surgeon: Rolm Bookbinder, MD;  Location: [facility];  Service: General;  Laterality: N/A;  . KNEE ARTHROSCOPY Right   . L pop PTA  10/2009   stent  . LAPAROSCOPIC SIGMOID COLECTOMY  10/2005  . SPLENECTOMY  1954  . VARICOSE VEIN SURGERY Right 1962    OB History    No data available       Home Medications    Prior to Admission medications   Medication Sig Start Date End Date Taking? Authorizing Provider  acetaminophen (TYLENOL) 325 MG  tablet Take 650 mg by mouth every 6 (six) hours as needed for fever.   Yes [provider]  Biotin 2500 MCG CAPS Take 1 capsule by mouth daily.    Yes [provider]  Calcium-Magnesium-Vitamin D (CITRACAL CALCIUM+D) 600-40-500 MG-MG-UNIT TB24 Take 1 tablet by mouth daily.    Yes [provider]  Flaxseed MISC Take 5 mLs by mouth daily. Take 1 tsp daily   Yes [provider]  furosemide (LASIX) 20 MG tablet Take 1 tablet (20 mg total) by mouth daily. 09/26/16  Yes Leonie Man, MD  latanoprost (XALATAN) 0.005 % ophthalmic solution Place 1 drop into both eyes at bedtime.   Yes [provider]  Multiple Vitamin (MULTIVITAMIN) tablet Take 1 tablet by mouth daily.     Yes [provider]  pantoprazole (PROTONIX) 20 MG tablet TAKE 1 TABLET BY MOUTH ONCE DAILY 04/08/17  Yes Pyrtle, Lajuan Lines, MD  polyethylene glycol (MIRALAX / GLYCOLAX) packet Take 17 g by mouth daily as needed for moderate constipation.    Yes [provider]  predniSONE (DELTASONE) 2.5 MG tablet TAKE ONE TABLET BY MOUTH ONCE DAILY 12/31/16  Yes Burns, Claudina Lick, MD  Probiotic Product (PROBIOTIC FORMULA PO) Take 1 tablet by mouth daily.     Yes [provider]  ranitidine (ZANTAC) 150 MG tablet Take 1 tab by mouth every morning. 02/12/17  Yes Esterwood, Amy S, PA-C  azithromycin (ZITHROMAX) 250 MG tablet Take two tabs the first day and then one tab daily for four days 06/25/17   Binnie Rail, MD  cefpodoxime (VANTIN) 200 MG tablet Take 1 tablet (200 mg total) by mouth 2 (two) times daily. 06/25/17   Binnie Rail, MD  clotrimazole-betamethasone (LOTRISONE) cream Apply 1 application topically 2 (two) times daily as needed. Patient taking differently: Apply 1 application topically 2 (two) times daily as needed (fungal infection).  03/16/15   Rowe Clack, MD    Family History Family History  Problem Relation Age of Onset  . Coronary artery disease Mother   .  Heart attack Mother 61  . Hyperlipidemia Mother   . Hypertension Mother   . Stomach cancer Father   . Hypertension Daughter   . Hyperlipidemia Daughter   . Arthritis Unknown        parent  . Transient ischemic attack Unknown        parent  . Colon cancer Neg Hx     Social History Social History   Tobacco Use  . Smoking status: Never Smoker  . Smokeless tobacco: Never Used  Substance Use Topics  . Alcohol use: No    Alcohol/week: 0.0 oz    Comment: rarely  . Drug use: No     Allergies   Aspirin; Sulfa antibiotics; Contrast media [iodinated diagnostic agents]; Lasix [furosemide];  and Statins   Review of Systems Review of Systems  Constitutional: Positive for chills and fever.  HENT: Positive for congestion.   Respiratory: Positive for cough. Negative for shortness of breath.   Cardiovascular: Negative for chest pain.  Musculoskeletal: Positive for back pain.  All other systems reviewed and are negative.    Physical Exam Updated Vital Signs BP 113/77   Pulse (!) 104   Temp 98.5 F (36.9 C) (Oral)   Resp (!) 25   Ht 5\' 2"  (1.575 m)   Wt 63.5 kg (140 lb)   SpO2 92%   BMI 25.61 kg/m   Physical Exam  Constitutional: She is oriented to person, place, and time. She appears well-developed and well-nourished. No distress.  HENT:  Head: Normocephalic and atraumatic.  Cardiovascular: Normal rate, regular rhythm and normal heart sounds.  No murmur heard. Pulmonary/Chest: Effort normal. No respiratory distress.  Crackles to right lower lung field. Tenderness to right lateral rib cage region.  Abdominal: Soft. She exhibits no distension. There is no tenderness.  Musculoskeletal: Normal range of motion.  Neurological: She is alert and oriented to person, place, and time.  Skin: Skin is warm and dry.  Nursing note and vitals reviewed.    ED Treatments / Results  Labs (all labs ordered are listed, but only abnormal results are displayed) Labs Reviewed    COMPREHENSIVE METABOLIC PANEL - Abnormal; Notable for the following components:      Result Value   Sodium 130 (*)    Chloride 93 (*)    CO2 21 (*)    Glucose, Bld 107 (*)    BUN 30 (*)    Creatinine, Ser 2.33 (*)    Calcium 8.5 (*)    AST 51 (*)    GFR calc non Af Amer 18 (*)    GFR calc Af Amer 21 (*)    Anion gap 16 (*)    All other components within normal limits  CBC WITH DIFFERENTIAL/PLATELET - Abnormal; Notable for the following components:   WBC 24.0 (*)    All other components within normal limits  PROTIME-INR - Abnormal; Notable for the following components:   Prothrombin Time 16.1 (*)    All other components within normal limits  I-STAT CG4 LACTIC ACID, ED - Abnormal; Notable for the following components:   Lactic Acid, Venous 5.20 (*)    All other components within normal limits  CULTURE, BLOOD (ROUTINE X 2)  CULTURE, BLOOD (ROUTINE X 2)  URINALYSIS, ROUTINE W REFLEX MICROSCOPIC  CORTISOL  I-STAT CG4 LACTIC ACID, ED    EKG  EKG Interpretation None       Radiology Dg Chest 2 View  Result Date: 06/25/2017 CLINICAL DATA:  Fever and cough. Left-sided posterior chest pain and fatigue x2 days. EXAM: CHEST  2 VIEW COMPARISON:  None. FINDINGS: Normal heart size with aortic atherosclerosis. No aneurysm. New pulmonary consolidation at the right lung base localizing predominantly within the right lower lobe and possibly portions of the right middle lobe. No significant effusion. No pneumothorax. No acute osseous abnormality. Degenerative changes are seen along the dorsal spine. Cholecystectomy clips are present in the upper abdomen. IMPRESSION: 1. Pneumonic consolidation at the right lung base localizing predominantly within the right lower lobe and possibly within portions of the right middle lobe. These results will be called to the ordering clinician or representative by the Radiologist Assistant, and communication documented in the PACS or zVision Dashboard. 2. Aortic  atherosclerosis. Electronically Signed   By: Shanon Brow  Randel Pigg M.D.   On: 06/25/2017 16:42    Procedures Procedures (including critical care time)  CRITICAL CARE Performed by: Ozella Almond Ward   Total critical care time: 35 minutes  Critical care time was exclusive of separately billable procedures and treating other patients.  Critical care was necessary to treat or prevent imminent or life-threatening deterioration.  Multiple fluid boluses, multiple antibiotics, hypotensive, elevated lactic.  Critical care was time spent personally by me on the following activities: development of treatment plan with patient and/or surrogate as well as nursing, discussions with consultants, evaluation of patient's response to treatment, examination of patient, obtaining history from patient or surrogate, ordering and performing treatments and interventions, ordering and review of laboratory studies, ordering and review of radiographic studies, pulse oximetry and re-evaluation of patient's condition.   Medications Ordered in ED Medications  cefTRIAXone (ROCEPHIN) 1 g in sodium chloride 0.9 % 100 mL IVPB (1 g Intravenous New Bag/Given 06/25/17 2105)  azithromycin (ZITHROMAX) 500 mg in sodium chloride 0.9 % 250 mL IVPB (not administered)  cefTRIAXone (ROCEPHIN) 1 g in sodium chloride 0.9 % 100 mL IVPB (not administered)  azithromycin (ZITHROMAX) 500 mg in sodium chloride 0.9 % 250 mL IVPB (not administered)  0.9 %  sodium chloride infusion (not administered)  multivitamin with minerals tablet 1 tablet (not administered)  pantoprazole (PROTONIX) EC tablet 20 mg (not administered)  polyethylene glycol (MIRALAX / GLYCOLAX) packet 17 g (not administered)  latanoprost (XALATAN) 0.005 % ophthalmic solution 1 drop (not administered)  acetaminophen (TYLENOL) tablet 650 mg (not administered)  famotidine (PEPCID) tablet 20 mg (not administered)  calcium-vitamin D (OSCAL WITH D) 500-200 MG-UNIT per tablet 1 tablet  (not administered)  hydrocortisone sodium succinate (SOLU-CORTEF) 100 MG injection 100 mg (not administered)  hydrocortisone sodium succinate (SOLU-CORTEF) 100 MG injection 50 mg (not administered)  sodium chloride 0.9 % bolus 1,000 mL (1,000 mLs Intravenous New Bag/Given 06/25/17 2050)  ipratropium-albuterol (DUONEB) 0.5-2.5 (3) MG/3ML nebulizer solution 3 mL (3 mLs Nebulization Given 06/25/17 2050)  sodium chloride 0.9 % bolus 1,000 mL (1,000 mLs Intravenous New Bag/Given 06/25/17 2106)  acetaminophen (TYLENOL) tablet 650 mg (650 mg Oral Given 06/25/17 2104)  oxyCODONE (Oxy IR/ROXICODONE) immediate release tablet 5 mg (5 mg Oral Given 06/25/17 2104)     Initial Impression / Assessment and Plan / ED Course  I have reviewed the triage vital signs and the nursing notes.  Pertinent labs & imaging results that were available during my care of the patient were reviewed by me and considered in my medical decision making (see chart for details).    Tamara Robertson is a 84 y.o. female who presents to ED for fever, cough, congestion.  Outpatient chest x-ray reviewed showing right lower lobe pneumonia.  Upon arrival, patient hypertensive.  She has crackles to right lung field consistent with outpatient imaging.  Lactic of 5.2, white count of 24.  Code sepsis initiated.  Blood cultures obtained. Antibiotics and weight-based fluids initiated.  9:29 PM -sepsis recheck completed.  Blood pressure responding well to fluids.  Hospitalist consulted who will admit.  Patient discussed with Dr. Wilson Singer who agrees with treatment plan.    Final Clinical Impressions(s) / ED Diagnoses   Final diagnoses:  Cough with fever  Community acquired pneumonia of right lower lobe of lung ([place])  SIRS (systemic inflammatory response syndrome) [facility])    ED Discharge Orders    None       Ward, Ozella Almond, PA-C 06/25/17 2309    Virgel Manifold, MD  06/26/17 0031  

## 2017-06-25 NOTE — ED Notes (Signed)
Notified EDP,Kohut,MD., pt. I-stat CG4 Lactic acid results 5.30 and RN,Kelly made aware and Hospitalist, Jared,MD. Made aware.

## 2017-06-25 NOTE — Telephone Encounter (Signed)
Copied from [place]. Topic: General - Other >> Jun 25, 2017  5:33 PM Neva Seat wrote: [facility] [phone]  Stat: 2 view chest Xray. Read by Dr. Randel Pigg.  Pneumonic consolidation at the right lung base.  Localizing predominantly with in the rt lower lobe and possibly within portions of the right middle lobe.

## 2017-06-25 NOTE — Progress Notes (Addendum)
BP still borderline low, repeat lactate 5.3.  3rd L bolus ordered.  Upgrading bed request to SDU.  Will add empiric vancomycin as per protocol for "icu" level treatment of CAP.

## 2017-06-26 ENCOUNTER — Encounter (HOSPITAL_COMMUNITY): Payer: Self-pay | Admitting: *Deleted

## 2017-06-26 ENCOUNTER — Inpatient Hospital Stay (HOSPITAL_COMMUNITY): Payer: Medicare Other

## 2017-06-26 DIAGNOSIS — J154 Pneumonia due to other streptococci: Secondary | ICD-10-CM | POA: Diagnosis present

## 2017-06-26 LAB — BASIC METABOLIC PANEL
ANION GAP: 12 (ref 5–15)
BUN: 27 mg/dL — ABNORMAL HIGH (ref 6–20)
CHLORIDE: 103 mmol/L (ref 101–111)
CO2: 18 mmol/L — ABNORMAL LOW (ref 22–32)
Calcium: 7.2 mg/dL — ABNORMAL LOW (ref 8.9–10.3)
Creatinine, Ser: 1.36 mg/dL — ABNORMAL HIGH (ref 0.44–1.00)
GFR calc non Af Amer: 34 mL/min — ABNORMAL LOW (ref >60)
GFR, EST AFRICAN AMERICAN: 40 mL/min — AB (ref >60)
Glucose, Bld: 107 mg/dL — ABNORMAL HIGH (ref 65–99)
POTASSIUM: 4.4 mmol/L (ref 3.5–5.1)
SODIUM: 133 mmol/L — AB (ref 135–145)

## 2017-06-26 LAB — URINALYSIS, ROUTINE W REFLEX MICROSCOPIC
Bilirubin Urine: NEGATIVE
Glucose, UA: NEGATIVE mg/dL
Hgb urine dipstick: NEGATIVE
Ketones, ur: 5 mg/dL — AB
Leukocytes, UA: NEGATIVE
Nitrite: NEGATIVE
Protein, ur: 30 mg/dL — AB
Specific Gravity, Urine: 1.021 (ref 1.005–1.030)
pH: 5 (ref 5.0–8.0)

## 2017-06-26 LAB — STREP PNEUMONIAE URINARY ANTIGEN: Strep Pneumo Urinary Antigen: POSITIVE — AB

## 2017-06-26 LAB — GLUCOSE, CAPILLARY
GLUCOSE-CAPILLARY: 71 mg/dL (ref 65–99)
Glucose-Capillary: 68 mg/dL (ref 65–99)

## 2017-06-26 LAB — PATHOLOGIST SMEAR REVIEW

## 2017-06-26 LAB — CBC
HCT: 36.8 % (ref 36.0–46.0)
HEMOGLOBIN: 12.4 g/dL (ref 12.0–15.0)
MCH: 28.4 pg (ref 26.0–34.0)
MCHC: 33.7 g/dL (ref 30.0–36.0)
MCV: 84.4 fL (ref 78.0–100.0)
Platelets: 136 10*3/uL — ABNORMAL LOW (ref 150–400)
RBC: 4.36 MIL/uL (ref 3.87–5.11)
RDW: 14.3 % (ref 11.5–15.5)
WBC: 28.2 10*3/uL — ABNORMAL HIGH (ref 4.0–10.5)

## 2017-06-26 LAB — CBG MONITORING, ED
GLUCOSE-CAPILLARY: 79 mg/dL (ref 65–99)
Glucose-Capillary: 72 mg/dL (ref 65–99)

## 2017-06-26 LAB — CORTISOL: Cortisol, Plasma: 41.6 ug/dL

## 2017-06-26 LAB — HIV ANTIBODY (ROUTINE TESTING W REFLEX): HIV Screen 4th Generation wRfx: NONREACTIVE

## 2017-06-26 LAB — I-STAT CG4 LACTIC ACID, ED
LACTIC ACID, VENOUS: 3.79 mmol/L — AB (ref 0.5–1.9)
Lactic Acid, Venous: 3.56 mmol/L (ref 0.5–1.9)

## 2017-06-26 MED ORDER — MORPHINE SULFATE (PF) 2 MG/ML IV SOLN
2.0000 mg | INTRAVENOUS | Status: DC | PRN
  Administered 2017-06-26 (×3): 2 mg via INTRAVENOUS
  Filled 2017-06-26 (×2): qty 1

## 2017-06-26 MED ORDER — HYDROCORTISONE NA SUCCINATE PF 100 MG IJ SOLR
25.0000 mg | Freq: Three times a day (TID) | INTRAMUSCULAR | Status: DC
  Administered 2017-06-26 – 2017-06-27 (×3): 25 mg via INTRAVENOUS
  Filled 2017-06-26 (×3): qty 0.5

## 2017-06-26 MED ORDER — MORPHINE SULFATE (PF) 2 MG/ML IV SOLN
INTRAVENOUS | Status: AC
  Filled 2017-06-26: qty 1

## 2017-06-26 MED ORDER — HYDRALAZINE HCL 20 MG/ML IJ SOLN
5.0000 mg | Freq: Three times a day (TID) | INTRAMUSCULAR | Status: DC | PRN
  Administered 2017-06-26: 5 mg via INTRAVENOUS
  Filled 2017-06-26: qty 1

## 2017-06-26 MED ORDER — ENOXAPARIN SODIUM 30 MG/0.3ML ~~LOC~~ SOLN
30.0000 mg | SUBCUTANEOUS | Status: DC
  Administered 2017-06-26: 30 mg via SUBCUTANEOUS
  Filled 2017-06-26: qty 0.3

## 2017-06-26 MED ORDER — MORPHINE SULFATE (PF) 4 MG/ML IV SOLN
2.0000 mg | INTRAVENOUS | Status: DC | PRN
  Administered 2017-06-26: 3 mg via INTRAVENOUS
  Administered 2017-06-26 – 2017-06-27 (×3): 4 mg via INTRAVENOUS
  Filled 2017-06-26 (×4): qty 1

## 2017-06-26 MED ORDER — INSULIN ASPART 100 UNIT/ML ~~LOC~~ SOLN
0.0000 [IU] | Freq: Three times a day (TID) | SUBCUTANEOUS | Status: DC
  Administered 2017-06-28: 1 [IU] via SUBCUTANEOUS

## 2017-06-26 MED ORDER — ONDANSETRON HCL 4 MG/2ML IJ SOLN
4.0000 mg | Freq: Four times a day (QID) | INTRAMUSCULAR | Status: DC | PRN
  Administered 2017-06-26 – 2017-07-05 (×3): 4 mg via INTRAVENOUS
  Filled 2017-06-26 (×3): qty 2

## 2017-06-26 MED ORDER — INSULIN ASPART 100 UNIT/ML ~~LOC~~ SOLN: 0.0000 [IU] | Freq: Every day | SUBCUTANEOUS | Status: DC

## 2017-06-26 NOTE — ED Notes (Signed)
ED TO INPATIENT HANDOFF REPORT  Name/Age/Gender Tamara Robertson 84 y.o. female  Code Status    Code Status Orders  (From admission, onward)        Start     Ordered   06/25/17 2131  Full code  Continuous     06/25/17 2134    Code Status History    Date Active Date Inactive Code Status Order ID Comments User Context   09/16/2014 16:21 09/20/2014 16:20 Full Code [id number]  Rolm Bookbinder, MD Inpatient    Advance Directive Documentation     Most Recent Value  Type of Advance Directive  Healthcare Power of Attorney, Living will  Pre-existing out of facility DNR order (yellow form or pink MOST form)  No data  "MOST" Form in Place?  No data      Home/SNF/Other Home  Chief Complaint Possible Pneumonia  Level of Care/Admitting Diagnosis ED Disposition    ED Disposition Condition Comment   Admit  Hospital Area: [place] [[id number]]  Level of Care: Stepdown [14]  Admit to SDU based on following criteria: Hemodynamic compromise or significant risk of instability:  Patient requiring short term acute titration and management of vasoactive drips, and invasive monitoring (i.e., CVP and Arterial line).  Diagnosis: Community acquired pneumonia of right lower lobe of lung [facility]) [[id number]]  Admitting Physician: Doreatha Massed  Attending Physician: Etta Quill [phone]  Estimated length of stay: past midnight tomorrow  Certification:: I certify this patient will need inpatient services for at least 2 midnights  PT Class (Do Not Modify): Inpatient [101]  PT Acc Code (Do Not Modify): Private [1]       Medical History Past Medical History:  Diagnosis Date  . Anxiety   . CAD (coronary artery disease)    RCA 40% stenosis cath 01/2011  . Cholelithiasis    s/p lap chole 09/2014  . Diverticular stricture [facility]) 2006   [facility]  . DIVERTICULITIS, HX OF   . Diverticulosis   . DYSLIPIDEMIA   . Elevated LFTs   . GERD   . Glaucoma    . Hepatic steatosis   . HOH (hard of hearing)   . Immune thrombocytopenic purpura (HCC)    chronic - baseline 80-100K, on pred  . Irritable bowel syndrome   . Left ovarian cyst dx 01/2013 CT   working with gyn, ?malignant - elevated tumor marker OVA1  . OSTEOARTHRITIS, KNEE, RIGHT   . OSTEOPENIA   . OVERACTIVE BLADDER   . UNSPECIFIED PERIPHERAL VASCULAR DISEASE   . URINARY INCONTINENCE     Allergies Allergies  Allergen Reactions  . Aspirin Other (See Comments)    ITP  . Sulfa Antibiotics Other (See Comments)    dizziness  . Contrast Media [Iodinated Diagnostic Agents] Hives  . Lasix [Furosemide] Rash  . Statins Other (See Comments)    IV Location/Drains/Wounds Patient Lines/Drains/Airways Status   Active Line/Drains/Airways    Name:   Placement date:   Placement time:   Site:   Days:   Peripheral IV 06/25/17 Left Hand   06/25/17    2050    Hand   1   Peripheral IV 06/26/17 Right Forearm   06/26/17    0027    Forearm   less than 1   Incision (Closed) 09/16/14 Abdomen   09/16/14    1420     1014   Incision - 4 Ports Abdomen   09/16/14    -  1014          Labs/Imaging Results for orders placed or performed during the hospital encounter of 06/25/17 (from the past 48 hour(s))  Culture, blood (Routine x 2)     Status: None (Preliminary result)   Collection Time: 06/25/17  6:53 PM  Result Value Ref Range   Specimen Description      BLOOD RIGHT ANTECUBITAL Performed at [facility] [place] [address] [place], [place] [address]    Special Requests      BOTTLES DRAWN AEROBIC AND ANAEROBIC Blood Culture adequate volume   Culture PENDING    Report Status PENDING   Comprehensive metabolic panel     Status: Abnormal   Collection Time: 06/25/17  8:06 PM  Result Value Ref Range   Sodium 130 (L) 135 - 145 mmol/L   Potassium 4.1 3.5 - 5.1 mmol/L   Chloride 93 (L) 101 - 111 mmol/L   CO2 21 (L) 22 - 32 mmol/L   Glucose, Bld 107 (H) 65 - 99 mg/dL   BUN 30  (H) 6 - 20 mg/dL   Creatinine, Ser 2.33 (H) 0.44 - 1.00 mg/dL   Calcium 8.5 (L) 8.9 - 10.3 mg/dL   Total Protein 6.8 6.5 - 8.1 g/dL   Albumin 3.6 3.5 - 5.0 g/dL   AST 51 (H) 15 - 41 U/L   ALT 25 14 - 54 U/L   Alkaline Phosphatase 57 38 - 126 U/L   Total Bilirubin 0.8 0.3 - 1.2 mg/dL   GFR calc non Af Amer 18 (L) >60 mL/min   GFR calc Af Amer 21 (L) >60 mL/min    Comment: (NOTE) The eGFR has been calculated using the CKD EPI equation. This calculation has not been validated in all clinical situations. eGFR's persistently <60 mL/min signify possible Chronic Kidney Disease.    Anion gap 16 (H) 5 - 15    Comment: Performed at [facility], [place] [address], [place], [place] [address]  CBC with Differential     Status: Abnormal   Collection Time: 06/25/17  8:06 PM  Result Value Ref Range   WBC 24.0 (H) 4.0 - 10.5 K/uL   RBC 4.51 3.87 - 5.11 MIL/uL   Hemoglobin 13.0 12.0 - 15.0 g/dL   HCT 37.9 36.0 - 46.0 %   MCV 84.0 78.0 - 100.0 fL   MCH 28.8 26.0 - 34.0 pg   MCHC 34.3 30.0 - 36.0 g/dL   RDW 14.1 11.5 - 15.5 %   Platelets PLATELET CLUMPS NOTED ON SMEAR, UNABLE TO ESTIMATE 150 - 400 K/uL   Neutrophils Relative % 91 %   Neutro Abs 21.9 1.7 - 7.7 K/uL   Lymphocytes Relative 8 %   Lymphs Abs 1.8 0.7 - 4.0 K/uL   Monocytes Relative 1 %   Monocytes Absolute 0.3 0.1 - 1.0 K/uL   Eosinophils Relative 0 %   Eosinophils Absolute 0.0 0.0 - 0.7 K/uL   Basophils Relative 0 %   Basophils Absolute 0.0 0.0 - 0.1 K/uL   WBC Morphology INCREASED BANDS (>20% BANDS)     Comment: Performed at [facility], [place] [address], [place], [place] [address]  Protime-INR     Status: Abnormal   Collection Time: 06/25/17  8:06 PM  Result Value Ref Range   Prothrombin Time 16.1 (H) 11.4 - 15.2 seconds   INR 1.30     Comment: Performed at [facility], [place] [address] [place], [place] [address]  I-Stat CG4 Lactic Acid, ED     Status: Abnormal    Collection Time: 06/25/17  8:25 PM  Result Value Ref Range   Lactic Acid, Venous 5.20 (HH) 0.5 - 1.9 mmol/L   Comment NOTIFIED PHYSICIAN   Cortisol     Status: None   Collection Time: 06/25/17  9:57 PM  Result Value Ref Range   Cortisol, Plasma 41.6 ug/dL    Comment: (NOTE) AM    6.7 - 22.6 ug/dL PM   <10.0       ug/dL Performed at [place] [address], [place], [place] [address]   I-Stat CG4 Lactic Acid, ED     Status: Abnormal   Collection Time: 06/25/17 10:11 PM  Result Value Ref Range   Lactic Acid, Venous 5.30 (HH) 0.5 - 1.9 mmol/L   Comment NOTIFIED PHYSICIAN   Urinalysis, Routine w reflex microscopic     Status: Abnormal   Collection Time: 06/25/17 11:51 PM  Result Value Ref Range   Color, Urine AMBER (A) YELLOW    Comment: BIOCHEMICALS MAY BE AFFECTED BY COLOR   APPearance CLOUDY (A) CLEAR   Specific Gravity, Urine 1.021 1.005 - 1.030   pH 5.0 5.0 - 8.0   Glucose, UA NEGATIVE NEGATIVE mg/dL   Hgb urine dipstick NEGATIVE NEGATIVE   Bilirubin Urine NEGATIVE NEGATIVE   Ketones, ur 5 (A) NEGATIVE mg/dL   Protein, ur 30 (A) NEGATIVE mg/dL   Nitrite NEGATIVE NEGATIVE   Leukocytes, UA NEGATIVE NEGATIVE   RBC / HPF 0-5 0 - 5 RBC/hpf   WBC, UA 0-5 0 - 5 WBC/hpf   Bacteria, UA RARE (A) NONE SEEN   Squamous Epithelial / LPF 0-5 (A) NONE SEEN   Mucus PRESENT    Hyaline Casts, UA PRESENT    Amorphous Crystal PRESENT     Comment: Performed at [facility], [place] [address], [place], [place] [address]  Strep pneumoniae urinary antigen     Status: Abnormal   Collection Time: 06/25/17 11:52 PM  Result Value Ref Range   Strep Pneumo Urinary Antigen POSITIVE (A) NEGATIVE    Comment: Performed at [place] [address], [place], [place] [address]  I-Stat CG4 Lactic Acid, ED     Status: Abnormal   Collection Time: 06/26/17 12:06 AM  Result Value Ref Range   Lactic Acid, Venous 3.79 (HH) 0.5 - 1.9 mmol/L   Comment NOTIFIED PHYSICIAN    I-Stat CG4 Lactic Acid, ED     Status: Abnormal   Collection Time: 06/26/17  2:16 AM  Result Value Ref Range   Lactic Acid, Venous 3.56 (HH) 0.5 - 1.9 mmol/L   Comment NOTIFIED PHYSICIAN   CBC     Status: Abnormal   Collection Time: 06/26/17  4:34 AM  Result Value Ref Range   WBC 28.2 (H) 4.0 - 10.5 K/uL   RBC 4.36 3.87 - 5.11 MIL/uL   Hemoglobin 12.4 12.0 - 15.0 g/dL   HCT 36.8 36.0 - 46.0 %   MCV 84.4 78.0 - 100.0 fL   MCH 28.4 26.0 - 34.0 pg   MCHC 33.7 30.0 - 36.0 g/dL   RDW 14.3 11.5 - 15.5 %   Platelets 136 (L) 150 - 400 K/uL    Comment: Performed at [facility], [place] [address] [place], [place] [address]  Basic metabolic panel     Status: Abnormal   Collection Time: 06/26/17  4:34 AM  Result Value Ref Range  Sodium 133 (L) 135 - 145 mmol/L   Potassium 4.4 3.5 - 5.1 mmol/L   Chloride 103 101 - 111 mmol/L   CO2 18 (L) 22 - 32 mmol/L   Glucose, Bld 107 (H) 65 - 99 mg/dL   BUN 27 (H) 6 - 20 mg/dL   Creatinine, Ser 1.36 (H) 0.44 - 1.00 mg/dL   Calcium 7.2 (L) 8.9 - 10.3 mg/dL   GFR calc non Af Amer 34 (L) >60 mL/min   GFR calc Af Amer 40 (L) >60 mL/min    Comment: (NOTE) The eGFR has been calculated using the CKD EPI equation. This calculation has not been validated in all clinical situations. eGFR's persistently <60 mL/min signify possible Chronic Kidney Disease.    Anion gap 12 5 - 15    Comment: Performed at [facility], [place] [address] [place], [place] [address]  CBG monitoring, ED     Status: None   Collection Time: 06/26/17  8:48 AM  Result Value Ref Range   Glucose-Capillary 79 65 - 99 mg/dL   Dg Chest 2 View  Result Date: 06/25/2017 CLINICAL DATA:  Fever and cough. Left-sided posterior chest pain and fatigue x2 days. EXAM: CHEST  2 VIEW COMPARISON:  None. FINDINGS: Normal heart size with aortic atherosclerosis. No aneurysm. New pulmonary consolidation at the right lung base localizing predominantly within the right  lower lobe and possibly portions of the right middle lobe. No significant effusion. No pneumothorax. No acute osseous abnormality. Degenerative changes are seen along the dorsal spine. Cholecystectomy clips are present in the upper abdomen. IMPRESSION: 1. Pneumonic consolidation at the right lung base localizing predominantly within the right lower lobe and possibly within portions of the right middle lobe. These results will be called to the ordering clinician or representative by the Radiologist Assistant, and communication documented in the PACS or zVision Dashboard. 2. Aortic atherosclerosis. Electronically Signed   By: Ashley Royalty M.D.   On: 06/25/2017 16:42   Dg Hip Unilat With Pelvis 2-3 Views Left  Result Date: 06/26/2017 CLINICAL DATA:  Sepsis, anterior hip pain for 2 years.  No injury. EXAM: DG HIP (WITH OR WITHOUT PELVIS) 2-3V LEFT COMPARISON:  None. FINDINGS: There is no evidence of hip fracture or dislocation. There is no evidence of arthropathy or other focal bone abnormality. Phleboliths project in the pelvis. IMPRESSION: Negative. Electronically Signed   By: Elon Alas M.D.   On: 06/26/2017 02:57    Pending Labs Unresulted Labs (From admission, onward)   Start     Ordered   06/25/17 2131  HIV antibody  Once,   R     06/25/17 2134   06/25/17 2131  Culture, sputum-assessment  Once,   R     06/25/17 2134   06/25/17 2131  Gram stain  Once,   R     06/25/17 2134   06/25/17 1848  Culture, blood (Routine x 2)  BLOOD CULTURE X 2,   STAT     06/25/17 1847      Vitals/Pain Today's Vitals   06/26/17 0830 06/26/17 0930 06/26/17 0948 06/26/17 1000  BP: 136/74 (!) 118/57  121/62  Pulse: 86 79  78  Resp: _0 Temp:      TempSrc:      SpO2: 95% 98%  98%  Weight:      Height:      PainSc:   7      Isolation Precautions No active isolations  Medications Medications  cefTRIAXone (ROCEPHIN) 1 g in sodium chloride 0.9 % 100 mL IVPB (not administered)  azithromycin  (ZITHROMAX) 500 mg in sodium chloride 0.9 % 250 mL IVPB (not administered)  0.9 %  sodium chloride infusion ( Intravenous New Bag/Given 06/26/17 0819)  multivitamin with minerals tablet 1 tablet (1 tablet Oral Given 06/26/17 0818)  pantoprazole (PROTONIX) EC tablet 20 mg (20 mg Oral Given 06/26/17 0818)  polyethylene glycol (MIRALAX / GLYCOLAX) packet 17 g (not administered)  latanoprost (XALATAN) 0.005 % ophthalmic solution 1 drop (1 drop Both Eyes Not Given 06/25/17 2200)  acetaminophen (TYLENOL) tablet 650 mg (not administered)  famotidine (PEPCID) tablet 20 mg (20 mg Oral Given 06/26/17 0818)  calcium-vitamin D (OSCAL WITH D) 500-200 MG-UNIT per tablet 1 tablet (not administered)  hydrocortisone sodium succinate (SOLU-CORTEF) 100 MG injection 50 mg (50 mg Intravenous Given 06/26/17 0632)  enoxaparin (LOVENOX) injection 40 mg (40 mg Subcutaneous Given 06/25/17 2211)  morphine 2 MG/ML injection 2-4 mg (2 mg Intravenous Given 06/26/17 0817)  sodium chloride 0.9 % bolus 1,000 mL (0 mLs Intravenous Stopped 06/25/17 2222)  ipratropium-albuterol (DUONEB) 0.5-2.5 (3) MG/3ML nebulizer solution 3 mL (3 mLs Nebulization Given 06/25/17 2050)  cefTRIAXone (ROCEPHIN) 1 g in sodium chloride 0.9 % 100 mL IVPB (0 g Intravenous Stopped 06/25/17 2135)  azithromycin (ZITHROMAX) 500 mg in sodium chloride 0.9 % 250 mL IVPB (0 mg Intravenous Stopped 06/25/17 2259)  sodium chloride 0.9 % bolus 1,000 mL (0 mLs Intravenous Stopped 06/25/17 2210)  acetaminophen (TYLENOL) tablet 650 mg (650 mg Oral Given 06/25/17 2104)  oxyCODONE (Oxy IR/ROXICODONE) immediate release tablet 5 mg (5 mg Oral Given 06/25/17 2104)  hydrocortisone sodium succinate (SOLU-CORTEF) 100 MG injection 100 mg (100 mg Intravenous Given 06/25/17 2210)  sodium chloride 0.9 % bolus 1,000 mL (0 mLs Intravenous Stopped 06/25/17 2325)  vancomycin (VANCOCIN) 1,500 mg in sodium chloride 0.9 % 500 mL IVPB (0 mg Intravenous Stopped 06/26/17 0108)    Mobility walks

## 2017-06-26 NOTE — ED Notes (Signed)
Patient's O2 saturation 95% on 5L nasal cannula.

## 2017-06-26 NOTE — ED Notes (Addendum)
Transferred patient to hospital bed for comfort while holding. Updated family on status. Pt daughter can be reached at [phone] [employer]). Pt daughter is taking both of the patients hearing aides and the patients glasses home with her

## 2017-06-26 NOTE — Progress Notes (Signed)
Pt gave permission to ask questions for nursing admission history in front of church deacon and her son. Lucius Conn BSN, RN-BC Admissions RN 06/26/2017 3:49 PM

## 2017-06-26 NOTE — Progress Notes (Addendum)
PROGRESS NOTE    Tamara Robertson  HQI:[id number] DOB: August 25, 1929 DOA: 06/25/2017 PCP: Pincus Sanes, MD   Outpatient Specialists:     Brief Narrative:  Tamara Robertson is a 84 y.o. female with medical history significant of CAD, ITP on chronic 2.5mg  daily prednisone. Patient presented to PCPs office earlier today with c/o back pain, cough, fever, chills, nausea, diarrhea.  Tm at home 102.2.  EMS called, patient decided not to go to ER at that time.  Went to PCPs office today where RLL PNA diagnosed and patient came to hospital.     Assessment & Plan:   Principal Problem:   Community acquired pneumonia of right lower lobe of lung (HCC) Active Problems:   Severe sepsis with acute organ dysfunction (HCC)   AKI (acute kidney injury) (HCC)   Current chronic use of systemic steroids   Streptococcal pneumonia (HCC)   Acute respiratory failure due to Strep pna -IV abx (de-escalate in AM) -wean O2 as tolerated  Sepsis due to strep PNA -cultures pending -monitor lactic acid for resolution  H/o ITP on chronic steroids -stress dose steroids  AKI -IVF -daily BMPs  Leukocytosis with bandemeia -From infection with bandemia +/- steroids  Hip pain -resolved -continue to monitor -has history of bursitis  Patient has improved some in ER and has been down graded to tele from SDU        DVT prophylaxis:  Lovenox   Code Status: Full Code   Family Communication: At bedside  Disposition Plan:     Consultants:    Subjective: Feeling better, hip not hurting  Objective: Vitals:   06/26/17 0930 06/26/17 1000 06/26/17 1030 06/26/17 1138  BP: (!) 118/57 121/62 118/61 (!) 160/80  Pulse: 79 78 76 89  Resp: 19 15 13  (!) 22  Temp:      TempSrc:      SpO2: 98% 98% 98% 97%  Weight:      Height:        Intake/Output Summary (Last 24 hours) at 06/26/2017 1236 Last data filed at 06/26/2017 0113 Gross per 24 hour  Intake 3850 ml  Output 850 ml  Net 3000 ml   Filed  Weights   06/25/17 2014  Weight: 63.5 kg (140 lb)    Examination:  General exam: Appears calm and comfortable - on 4 L O2 Respiratory system: no wheezing, occasional rhales Cardiovascular system: S1 & S2 heard, RRR. No JVD, murmurs, rubs, gallops or clicks. No pedal edema. Gastrointestinal system: Abdomen is nondistended, soft and nontender. No organomegaly or masses felt. Normal bowel sounds heard. Central nervous system: Alert and oriented. No focal neurological deficits. Extremities: Symmetric 5 x 5 power. Skin: No rashes, lesions or ulcers Psychiatry: Judgement and insight appear normal. Mood & affect appropriate.     Data Reviewed: I have personally reviewed following labs and imaging studies  CBC: Recent Labs  Lab 06/25/17 1614 06/25/17 2006 06/26/17 0434  WBC 22.7* 24.0* 28.2*  NEUTROABS 20.9* 21.9  --   HGB 13.2 13.0 12.4  HCT 40.1 37.9 36.8  MCV 84.9 84.0 84.4  PLT 179.0 PLATELET CLUMPS NOTED ON SMEAR, UNABLE TO ESTIMATE 136*   Basic Metabolic Panel: Recent Labs  Lab 06/25/17 1614 06/25/17 2006 06/26/17 0434  NA 131* 130* 133*  K 4.1 4.1 4.4  CL 92* 93* 103  CO2 27 21* 18*  GLUCOSE 112* 107* 107*  BUN 28* 30* 27*  CREATININE 2.07* 2.33* 1.36*  CALCIUM 9.0 8.5* 7.2*   GFR: Estimated  Creatinine Clearance: 26.5 mL/min (A) (by C-G formula based on SCr of 1.36 mg/dL (H)). Liver Function Tests: Recent Labs  Lab 06/25/17 1614 06/25/17 2006  AST 33 51*  ALT 21 25  ALKPHOS 56 57  BILITOT 1.0 0.8  PROT 6.6 6.8  ALBUMIN 3.7 3.6   No results for input(s): LIPASE, AMYLASE in the last 168 hours. No results for input(s): AMMONIA in the last 168 hours. Coagulation Profile: Recent Labs  Lab 06/25/17 2006  INR 1.30   Cardiac Enzymes: No results for input(s): CKTOTAL, CKMB, CKMBINDEX, TROPONINI in the last 168 hours. BNP (last 3 results) No results for input(s): PROBNP in the last 8760 hours. HbA1C: No results for input(s): HGBA1C in the last 72  hours. CBG: Recent Labs  Lab 06/26/17 0848  GLUCAP 79   Lipid Profile: No results for input(s): CHOL, HDL, LDLCALC, TRIG, CHOLHDL, LDLDIRECT in the last 72 hours. Thyroid Function Tests: No results for input(s): TSH, T4TOTAL, FREET4, T3FREE, THYROIDAB in the last 72 hours. Anemia Panel: No results for input(s): VITAMINB12, FOLATE, FERRITIN, TIBC, IRON, RETICCTPCT in the last 72 hours. Urine analysis:    Component Value Date/Time   COLORURINE AMBER (A) 06/25/2017 2351   APPEARANCEUR CLOUDY (A) 06/25/2017 2351   LABSPEC 1.021 06/25/2017 2351   PHURINE 5.0 06/25/2017 2351   GLUCOSEU NEGATIVE 06/25/2017 2351   GLUCOSEU NEGATIVE 04/26/2016 1121   HGBUR NEGATIVE 06/25/2017 2351   BILIRUBINUR NEGATIVE 06/25/2017 2351   KETONESUR 5 (A) 06/25/2017 2351   PROTEINUR 30 (A) 06/25/2017 2351   UROBILINOGEN 0.2 04/26/2016 1121   NITRITE NEGATIVE 06/25/2017 2351   LEUKOCYTESUR NEGATIVE 06/25/2017 2351      Recent Results (from the past 240 hour(s))  Culture, blood (Routine x 2)     Status: None (Preliminary result)   Collection Time: 06/25/17  6:53 PM  Result Value Ref Range Status   Specimen Description   Final    BLOOD RIGHT ANTECUBITAL Performed at [facility], [address], [place], [place] [address]    Special Requests   Final    BOTTLES DRAWN AEROBIC AND ANAEROBIC Blood Culture adequate volume   Culture PENDING  Incomplete   Report Status PENDING  Incomplete      Anti-infectives (From admission, onward)   Start     Dose/Rate Route Frequency Ordered Stop   06/26/17 2200  azithromycin (ZITHROMAX) 500 mg in sodium chloride 0.9 % 250 mL IVPB     500 mg 250 mL/hr over 60 Minutes Intravenous Every 24 hours 06/25/17 2048     06/26/17 2100  cefTRIAXone (ROCEPHIN) 1 g in sodium chloride 0.9 % 100 mL IVPB     1 g 200 mL/hr over 30 Minutes Intravenous Every 24 hours 06/25/17 2048     06/25/17 2315  vancomycin (VANCOCIN) 1,500 mg in sodium chloride 0.9 % 500 mL  IVPB     1,500 mg 250 mL/hr over 120 Minutes Intravenous  Once 06/25/17 2300 06/26/17 0108   06/25/17 2045  cefTRIAXone (ROCEPHIN) 1 g in sodium chloride 0.9 % 100 mL IVPB     1 g 200 mL/hr over 30 Minutes Intravenous  Once 06/25/17 2038 06/25/17 2135   06/25/17 2045  azithromycin (ZITHROMAX) 500 mg in sodium chloride 0.9 % 250 mL IVPB     500 mg 250 mL/hr over 60 Minutes Intravenous  Once 06/25/17 2038 06/25/17 2259       Radiology Studies: Dg Chest 2 View  Result Date: 06/25/2017 CLINICAL DATA:  Fever and cough.  Left-sided posterior chest pain and fatigue x2 days. EXAM: CHEST  2 VIEW COMPARISON:  None. FINDINGS: Normal heart size with aortic atherosclerosis. No aneurysm. New pulmonary consolidation at the right lung base localizing predominantly within the right lower lobe and possibly portions of the right middle lobe. No significant effusion. No pneumothorax. No acute osseous abnormality. Degenerative changes are seen along the dorsal spine. Cholecystectomy clips are present in the upper abdomen. IMPRESSION: 1. Pneumonic consolidation at the right lung base localizing predominantly within the right lower lobe and possibly within portions of the right middle lobe. These results will be called to the ordering clinician or representative by the Radiologist Assistant, and communication documented in the PACS or zVision Dashboard. 2. Aortic atherosclerosis. Electronically Signed   By: Tollie Eth M.D.   On: 06/25/2017 16:42   Dg Hip Unilat With Pelvis 2-3 Views Left  Result Date: 06/26/2017 CLINICAL DATA:  Sepsis, anterior hip pain for 2 years.  No injury. EXAM: DG HIP (WITH OR WITHOUT PELVIS) 2-3V LEFT COMPARISON:  None. FINDINGS: There is no evidence of hip fracture or dislocation. There is no evidence of arthropathy or other focal bone abnormality. Phleboliths project in the pelvis. IMPRESSION: Negative. Electronically Signed   By: Awilda Metro M.D.   On: 06/26/2017 02:57         Scheduled Meds: . calcium-vitamin D  1 tablet Oral Q breakfast  . enoxaparin (LOVENOX) injection  40 mg Subcutaneous Q24H  . famotidine  20 mg Oral Daily  . hydrocortisone sod succinate (SOLU-CORTEF) inj  50 mg Intravenous Q8H  . insulin aspart  0-5 Units Subcutaneous QHS  . insulin aspart  0-9 Units Subcutaneous TID WC  . latanoprost  1 drop Both Eyes QHS  . multivitamin with minerals  1 tablet Oral Daily  . pantoprazole  20 mg Oral Daily   Continuous Infusions: . sodium chloride 100 mL/hr at 06/26/17 0819  . azithromycin    . cefTRIAXone (ROCEPHIN)  IV       LOS: 1 day    Time spent: 35 min    Joseph Art, DO [facility] Pager [phone]  If 7PM-7AM, please contact night-coverage [email or web address] Password [facility] 06/26/2017, 12:36 PM

## 2017-06-26 NOTE — Progress Notes (Signed)
Lactate slowly trending down, now down to 3.5.  Patient also having ongoing L hip pain which is severe.  Evaluated this earlier: no erythema, edema, or other obvious suggestion of septic arthritis of the hip.  Still think source of patients infection is the much more obvious RLL PNA.  Will get L hip x ray however to begin further evaluation.  If persists may wish to get ortho eval in AM.

## 2017-06-27 ENCOUNTER — Inpatient Hospital Stay (HOSPITAL_COMMUNITY): Payer: Medicare Other

## 2017-06-27 DIAGNOSIS — J9601 Acute respiratory failure with hypoxia: Secondary | ICD-10-CM

## 2017-06-27 LAB — BLOOD GAS, ARTERIAL
Acid-base deficit: 2.7 mmol/L — ABNORMAL HIGH (ref 0.0–2.0)
Bicarbonate: 22.4 mmol/L (ref 20.0–28.0)
FIO2: 100
MECHVT: 400 mL
O2 SAT: 99.6 %
PATIENT TEMPERATURE: 98.6
PCO2 ART: 42.7 mmHg (ref 32.0–48.0)
PEEP: 5 cmH2O
PO2 ART: 322 mmHg — AB (ref 83.0–108.0)
RATE: 18 resp/min
pH, Arterial: 7.34 — ABNORMAL LOW (ref 7.350–7.450)

## 2017-06-27 LAB — CBC
HEMATOCRIT: 34.7 % — AB (ref 36.0–46.0)
HEMOGLOBIN: 11.6 g/dL — AB (ref 12.0–15.0)
MCH: 28.4 pg (ref 26.0–34.0)
MCHC: 33.4 g/dL (ref 30.0–36.0)
MCV: 84.8 fL (ref 78.0–100.0)
Platelets: 76 10*3/uL — ABNORMAL LOW (ref 150–400)
RBC: 4.09 MIL/uL (ref 3.87–5.11)
RDW: 14.9 % (ref 11.5–15.5)
WBC: 32.8 10*3/uL — ABNORMAL HIGH (ref 4.0–10.5)

## 2017-06-27 LAB — GLUCOSE, CAPILLARY
GLUCOSE-CAPILLARY: 64 mg/dL — AB (ref 65–99)
GLUCOSE-CAPILLARY: 91 mg/dL (ref 65–99)
GLUCOSE-CAPILLARY: 107 mg/dL — AB (ref 65–99)
Glucose-Capillary: 79 mg/dL (ref 65–99)
Glucose-Capillary: 105 mg/dL — ABNORMAL HIGH (ref 65–99)

## 2017-06-27 LAB — BASIC METABOLIC PANEL
Anion gap: 9 (ref 5–15)
BUN: 23 mg/dL — ABNORMAL HIGH (ref 6–20)
CHLORIDE: 107 mmol/L (ref 101–111)
CO2: 22 mmol/L (ref 22–32)
Calcium: 8.2 mg/dL — ABNORMAL LOW (ref 8.9–10.3)
Creatinine, Ser: 0.75 mg/dL (ref 0.44–1.00)
GFR calc Af Amer: >60 mL/min (ref >60)
GFR calc non Af Amer: >60 mL/min (ref >60)
Glucose, Bld: 89 mg/dL (ref 65–99)
POTASSIUM: 4 mmol/L (ref 3.5–5.1)
SODIUM: 138 mmol/L (ref 135–145)

## 2017-06-27 LAB — LACTIC ACID, PLASMA: LACTIC ACID, VENOUS: 1.4 mmol/L (ref 0.5–1.9)

## 2017-06-27 LAB — TRIGLYCERIDES: Triglycerides: 416 mg/dL — ABNORMAL HIGH (ref <150)

## 2017-06-27 MED ORDER — CALCIUM CARBONATE-VITAMIN D 500-200 MG-UNIT PO TABS
1.0000 | ORAL_TABLET | Freq: Every day | ORAL | Status: DC
  Administered 2017-06-28 – 2017-07-03 (×6): 1
  Filled 2017-06-27 (×6): qty 1

## 2017-06-27 MED ORDER — SODIUM CHLORIDE 0.9 % IV SOLN
1250.0000 mg | INTRAVENOUS | Status: DC
  Administered 2017-06-27: 1250 mg via INTRAVENOUS
  Filled 2017-06-27: qty 1250

## 2017-06-27 MED ORDER — FAMOTIDINE 20 MG PO TABS
20.0000 mg | ORAL_TABLET | Freq: Every day | ORAL | Status: DC
  Administered 2017-06-28 – 2017-07-02 (×5): 20 mg
  Filled 2017-06-27 (×5): qty 1

## 2017-06-27 MED ORDER — ORAL CARE MOUTH RINSE
15.0000 mL | Freq: Four times a day (QID) | OROMUCOSAL | Status: DC
  Administered 2017-06-28 – 2017-07-03 (×23): 15 mL via OROMUCOSAL

## 2017-06-27 MED ORDER — ENOXAPARIN SODIUM 40 MG/0.4ML ~~LOC~~ SOLN: 40.0000 mg | SUBCUTANEOUS | Status: DC

## 2017-06-27 MED ORDER — SODIUM CHLORIDE 0.9 % IV SOLN
2.0000 g | INTRAVENOUS | Status: DC
  Administered 2017-06-27 – 2017-07-05 (×9): 2 g via INTRAVENOUS
  Filled 2017-06-27 (×4): qty 2
  Filled 2017-06-27: qty 20
  Filled 2017-06-27 (×5): qty 2

## 2017-06-27 MED ORDER — ADULT MULTIVITAMIN W/MINERALS CH: 1.0000 | ORAL_TABLET | Freq: Every day | ORAL | Status: DC

## 2017-06-27 MED ORDER — ALBUTEROL SULFATE (2.5 MG/3ML) 0.083% IN NEBU
2.5000 mg | INHALATION_SOLUTION | RESPIRATORY_TRACT | Status: DC | PRN
  Administered 2017-06-27: 2.5 mg via RESPIRATORY_TRACT
  Filled 2017-06-27: qty 3

## 2017-06-27 MED ORDER — PRO-STAT SUGAR FREE PO LIQD
30.0000 mL | Freq: Two times a day (BID) | ORAL | Status: DC
  Administered 2017-06-27 – 2017-06-28 (×2): 30 mL
  Filled 2017-06-27 (×2): qty 30

## 2017-06-27 MED ORDER — PROPOFOL 1000 MG/100ML IV EMUL
0.0000 ug/kg/min | INTRAVENOUS | Status: AC
  Administered 2017-06-27: 40 ug/kg/min via INTRAVENOUS
  Administered 2017-06-27: 10 ug/kg/min via INTRAVENOUS
  Administered 2017-06-28: 40 ug/kg/min via INTRAVENOUS
  Filled 2017-06-27 (×3): qty 100

## 2017-06-27 MED ORDER — ACETAMINOPHEN 325 MG PO TABS
650.0000 mg | ORAL_TABLET | Freq: Four times a day (QID) | ORAL | Status: DC | PRN
Start: 2017-06-27 — End: 2017-07-03

## 2017-06-27 MED ORDER — DOCUSATE SODIUM 50 MG/5ML PO LIQD: 100.0000 mg | Freq: Two times a day (BID) | ORAL | Status: DC | PRN

## 2017-06-27 MED ORDER — FENTANYL CITRATE (PF) 100 MCG/2ML IJ SOLN
INTRAMUSCULAR | Status: AC
  Administered 2017-06-27: 100 ug
  Filled 2017-06-27: qty 2

## 2017-06-27 MED ORDER — HYDROCORTISONE NA SUCCINATE PF 100 MG IJ SOLR
50.0000 mg | Freq: Three times a day (TID) | INTRAMUSCULAR | Status: DC
  Administered 2017-06-27 – 2017-07-01 (×12): 50 mg via INTRAVENOUS
  Filled 2017-06-27 (×7): qty 2
  Filled 2017-06-27: qty 1
  Filled 2017-06-27 (×5): qty 2

## 2017-06-27 MED ORDER — OXYCODONE HCL 5 MG PO TABS: 5.0000 mg | ORAL_TABLET | ORAL | Status: DC | PRN

## 2017-06-27 MED ORDER — FENTANYL CITRATE (PF) 100 MCG/2ML IJ SOLN
50.0000 ug | INTRAMUSCULAR | Status: DC | PRN
  Administered 2017-06-27 – 2017-06-30 (×5): 50 ug via INTRAVENOUS
  Filled 2017-06-27 (×3): qty 2

## 2017-06-27 MED ORDER — CHLORHEXIDINE GLUCONATE 0.12% ORAL RINSE (MEDLINE KIT)
15.0000 mL | Freq: Two times a day (BID) | OROMUCOSAL | Status: DC
  Administered 2017-06-27 – 2017-07-03 (×12): 15 mL via OROMUCOSAL

## 2017-06-27 MED ORDER — VITAL HIGH PROTEIN PO LIQD
1000.0000 mL | ORAL | Status: DC
  Administered 2017-06-27 – 2017-06-28 (×2): 1000 mL
  Filled 2017-06-27: qty 1000

## 2017-06-27 MED ORDER — IPRATROPIUM-ALBUTEROL 0.5-2.5 (3) MG/3ML IN SOLN
3.0000 mL | Freq: Four times a day (QID) | RESPIRATORY_TRACT | Status: DC
  Administered 2017-06-27 – 2017-07-04 (×30): 3 mL via RESPIRATORY_TRACT
  Filled 2017-06-27 (×30): qty 3

## 2017-06-27 MED ORDER — ADULT MULTIVITAMIN LIQUID CH
15.0000 mL | Freq: Every day | ORAL | Status: DC
  Administered 2017-06-28 – 2017-07-04 (×7): 15 mL
  Filled 2017-06-27 (×7): qty 15

## 2017-06-27 MED ORDER — MIDAZOLAM HCL 2 MG/2ML IJ SOLN
INTRAMUSCULAR | Status: AC
  Administered 2017-06-27: 2 mg
  Filled 2017-06-27: qty 2

## 2017-06-27 NOTE — Procedures (Signed)
Intubation Procedure Note RAEDYN KLINCK [id number] February 02, 1930  Procedure: Intubation Indications: Airway protection and maintenance  Procedure Details Consent: Risks of procedure as well as the alternatives and risks of each were explained to the (patient/caregiver).  Consent for procedure obtained. Time Out: Verified patient identification, verified procedure, site/side was marked, verified correct patient position, special equipment/implants available, medications/allergies/relevent history reviewed, required imaging and test results available.  Performed  Drugs Etomidate 20mg  Versed 2mg ,Fentanyl 25mcg IV DL x 1 with GS 3 blade Grade 1 view 7.5 ET tube passed through cords under direct visualization Placement confirmed with bilateral breath sounds, positive EtCO2 change and smoke in tube   Evaluation Hemodynamic Status: BP stable throughout; O2 sats: stable throughout Patient's Current Condition: stable Complications: No apparent complications Patient did tolerate procedure well. Chest X-ray ordered to verify placement.  CXR: pending.   Simonne Maffucci 06/27/2017

## 2017-06-27 NOTE — Consult Note (Signed)
Attending:  I have seen and examined the patient with nurse practitioner/resident and agree with the note above.  We formulated the plan together and I elicited the following history.    Subjective: 84 year old female with a past medical history significant for coronary artery disease ITP on prednisone was admitted to our facility with back pain cough fevers and chills.  She had a fever to 102 and noted to have right lower lobe pneumonia.  She was treated with appropriate antibiotics in the setting of a positive urine strep antigen.  However, despite this the x-ray findings have worsened and her oxygen needs have increased that she was moved to the intensive care unit.  She also complains of hip pain and says she has a bad case of "bursitis".  Objective: Vitals:   06/27/17 1319 06/27/17 1445 06/27/17 1500 06/27/17 1501  BP: (!) 162/61 (!) 178/81 (!) 208/90   Pulse: 84 94    Resp: 20 (!) 36 (!) 31   Temp: 97.7 F (36.5 C) 97.9 F (36.6 C)    TempSrc: Oral Oral    SpO2: 92%  93% 93%  Weight:  159 lb 6.3 oz (72.3 kg)    Height:  5\' 2"  (1.575 m)        Intake/Output Summary (Last 24 hours) at 06/27/2017 1541 Last data filed at 06/27/2017 1445 Gross per 24 hour  Intake 4436.67 ml  Output 1700 ml  Net 2736.67 ml    General:  Dyspnea but able to speak in full sentences, some accessory muscle use HENT: NCAT OP clear PULM: Crackles R base > L, increased respiratory effort CV: RRR, no mgr GI: BS+, soft, nontender MSK: normal bulk and tone Neuro: awake, alert, no distress, MAEW    CBC    Component Value Date/Time   WBC 32.8 (H) 06/27/2017 0550   RBC 4.09 06/27/2017 0550   HGB 11.6 (L) 06/27/2017 0550   HGB 13.9 04/17/2017 1301   HCT 34.7 (L) 06/27/2017 0550   HCT 42.2 04/17/2017 1301   PLT 76 (L) 06/27/2017 0550   PLT 206 04/17/2017 1301   MCV 84.8 06/27/2017 0550   MCV 86.7 04/17/2017 1301   MCH 28.4 06/27/2017 0550   MCHC 33.4 06/27/2017 0550   RDW 14.9 06/27/2017 0550   RDW 14.3 04/17/2017 1301   LYMPHSABS 1.8 06/25/2017 2006   LYMPHSABS 3.5 (H) 04/17/2017 1301   MONOABS 0.3 06/25/2017 2006   MONOABS 1.2 (H) 04/17/2017 1301   EOSABS 0.0 06/25/2017 2006   EOSABS 0.2 04/17/2017 1301   BASOSABS 0.0 06/25/2017 2006   BASOSABS 0.0 04/17/2017 1301    BMET    Component Value Date/Time   NA 138 06/27/2017 0550   NA 142 02/16/2016 1526   K 4.0 06/27/2017 0550   K 4.3 02/16/2016 1526   CL 107 06/27/2017 0550   CL 105 04/17/2012 1452   CO2 22 06/27/2017 0550   CO2 27 02/16/2016 1526   GLUCOSE 89 06/27/2017 0550   GLUCOSE 184 (H) 02/16/2016 1526   GLUCOSE 122 (H) 04/17/2012 1452   BUN 23 (H) 06/27/2017 0550   BUN 14.3 02/16/2016 1526   CREATININE 0.75 06/27/2017 0550   CREATININE 0.8 02/16/2016 1526   CALCIUM 8.2 (L) 06/27/2017 0550   CALCIUM 9.5 02/16/2016 1526   GFRNONAA >60 06/27/2017 0550   GFRAA >60 06/27/2017 0550    CXR images personally reviewed showing worsening infiltrate in the right lung, airspace disease  Urine strep antigen is positive  Impression/Plan:  Severe community-acquired pneumonia  from strep pneumo: Increase ceftriaxone to 2 g daily, stop azithromycin, stop vancomycin Acute respiratory failure with hypoxemia: Given rapid decline with increased O2 requirements, dyspnea, and worsening chest x-ray findings we will perform intubation now.  We discussed this with the patient and the patient's family and they were willing to proceed with intubation with the understanding that she would have a limited course of time on a mechanical ventilator with hopes of fairly rapid recovery.  We did not establish a definitive timeline but everyone is clear that she would not want a prolonged course of mechanical ventilation ITP on chronic steroids: Continue hydrocortisone 50 mg IV every 6, monitor for bleeding, monitor CBC  I updated her daughter Ina Homes by phone on February 21  My cc time 40 minutes  Roselie Awkward, MD [facility] Pager:  [phone] Cell: [phone] After 3pm or if no response, call [phone]

## 2017-06-27 NOTE — Consult Note (Signed)
PULMONARY / CRITICAL CARE MEDICINE   Name: Tamara Robertson MRN: [id number] DOB: 23-Aug-1929    ADMISSION DATE:  06/25/2017 CONSULTATION DATE:  06/27/17  REFERRING MD:  Dr. Benjamine Mola / [facility]   CHIEF COMPLAINT:  Respiratory distress   HISTORY OF PRESENT ILLNESS:   [name] admitted 2/19 with reports of fever, cough, right lower back pain and concern for pneumonia.   PMH CAD, ITP on chronic prednisone (2.5mg  QD).  The patient was seen at her PCP office on 2/19 with an elevated WBC to 22.5, fever to 102 (evening prior, refused EMS transport) and concern for RML, RLL PNA.  She was referred to the ER for evaluation.  ER work up notable for acute kidney injury (cr 2.33 up from 0.76), WBC 22.7, hgb 13.2 and platelets 179.  Lactic acid 5.20.  CXR was concerning for RLL consolidation.  The patient was admitted per [facility] for further evaluation.  She was treated with rocephin & azithromycin for PNA.  In addition, she was given IVF, cultured and cortisol assessed.  Urine strep antigen was positive.  Lactate cleared with IVF's.  On 2/21, the patient had increasing O2 needs and distress.  She was subsequently transferred to ICU for evaluation.    The patient has been getting morphine (20 mg) since admit for hip bursitis.  She has also received oxycodone (5mg ).  She reports some relief from hip pain but feels she has had confusion / drowsiness due to the medications.  She denies significant shortness of breath, back pain, cough with sputum production.  Reports she lives alone.  Has a neighbor who has been sick with cough / congestion.  She states she has never thought about the concept of ventilation. On transfer to ICU, she is on 15L HFNC.    PAST MEDICAL HISTORY :  She  has a past medical history of Anxiety, CAD (coronary artery disease), Cholelithiasis, Diverticular stricture (HCC) (2006), DIVERTICULITIS, HX OF, Diverticulosis, DYSLIPIDEMIA, Elevated LFTs, GERD, Glaucoma, Hepatic steatosis, HOH (hard of hearing), Immune  thrombocytopenic purpura (HCC), Irritable bowel syndrome, Left ovarian cyst (dx 01/2013 CT), OSTEOARTHRITIS, KNEE, RIGHT, OSTEOPENIA, OVERACTIVE BLADDER, UNSPECIFIED PERIPHERAL VASCULAR DISEASE, and URINARY INCONTINENCE.  PAST SURGICAL HISTORY: She  has a past surgical history that includes Splenectomy (1954); Abdominal hysterectomy (1963); L pop PTA (10/2009); Laparoscopic sigmoid colectomy (10/2005); Cataract extraction, bilateral (10/2010); Knee arthroscopy (Right); Angioplasty; Cardiac catheterization; Cholecystectomy (N/A, 09/16/2014); Appendectomy (1956); and Varicose vein surgery (Right, 1962).  Allergies  Allergen Reactions  . Aspirin Other (See Comments)    ITP  . Sulfa Antibiotics Other (See Comments)    dizziness  . Contrast Media [Iodinated Diagnostic Agents] Hives  . Lasix [Furosemide] Rash  . Statins Other (See Comments)    No current facility-administered medications on file prior to encounter.    Current Outpatient Medications on File Prior to Encounter  Medication Sig  . acetaminophen (TYLENOL) 325 MG tablet Take 650 mg by mouth every 6 (six) hours as needed for fever.  . Biotin 2500 MCG CAPS Take 1 capsule by mouth daily.   . Calcium-Magnesium-Vitamin D (CITRACAL CALCIUM+D) 600-40-500 MG-MG-UNIT TB24 Take 1 tablet by mouth daily.   . Flaxseed MISC Take 5 mLs by mouth daily. Take 1 tsp daily  . furosemide (LASIX) 20 MG tablet Take 1 tablet (20 mg total) by mouth daily.  [name] latanoprost (XALATAN) 0.005 % ophthalmic solution Place 1 drop into both eyes at bedtime.  . Multiple Vitamin (MULTIVITAMIN) tablet Take 1 tablet by mouth daily.    [name]  pantoprazole (PROTONIX) 20 MG tablet TAKE 1 TABLET BY MOUTH ONCE DAILY  . polyethylene glycol (MIRALAX / GLYCOLAX) packet Take 17 g by mouth daily as needed for moderate constipation.   . predniSONE (DELTASONE) 2.5 MG tablet TAKE ONE TABLET BY MOUTH ONCE DAILY  . Probiotic Product (PROBIOTIC FORMULA PO) Take 1 tablet by mouth daily.    .  ranitidine (ZANTAC) 150 MG tablet Take 1 tab by mouth every morning.  [name] azithromycin (ZITHROMAX) 250 MG tablet Take two tabs the first day and then one tab daily for four days  . cefpodoxime (VANTIN) 200 MG tablet Take 1 tablet (200 mg total) by mouth 2 (two) times daily.  . clotrimazole-betamethasone (LOTRISONE) cream Apply 1 application topically 2 (two) times daily as needed. (Patient taking differently: Apply 1 application topically 2 (two) times daily as needed (fungal infection). )    FAMILY HISTORY:  Her indicated that her mother is deceased. She indicated that her father is deceased. She indicated that her maternal grandmother is deceased. She indicated that her maternal grandfather is deceased. She indicated that her paternal grandmother is deceased. She indicated that her paternal grandfather is deceased. She indicated that the status of her daughter is unknown. She indicated that only one of her two children is alive. She indicated that the status of her neg hx is unknown.   SOCIAL HISTORY: She  reports that  has never smoked. she has never used smokeless tobacco. She reports that she does not drink alcohol or use drugs.  REVIEW OF SYSTEMS:  POSITIVES IN BOLD  Gen: Denies fever, chills, weight change, fatigue, night sweats HEENT: Denies blurred vision, double vision, hearing loss, tinnitus, sinus congestion, rhinorrhea, sore throat, neck stiffness, dysphagia PULM: Denies shortness of breath, cough, sputum production, hemoptysis, wheezing CV: Denies right sided chest pain, edema, orthopnea, paroxysmal nocturnal dyspnea, palpitations GI: Denies abdominal pain, nausea, vomiting, diarrhea, hematochezia, melena, constipation, change in bowel habits GU: Denies dysuria, hematuria, polyuria, oliguria, urethral discharge Endocrine: Denies hot or cold intolerance, polyuria, polyphagia or appetite change Derm: Denies rash, dry skin, scaling or peeling skin change Heme: Denies easy bruising,  bleeding, bleeding gums Neuro: Denies headache, numbness, weakness, slurred speech, loss of memory or consciousness   SUBJECTIVE:    VITAL SIGNS: BP (!) 162/61 (BP Location: Left Arm)   Pulse 84   Temp 97.7 F (36.5 C) (Oral)   Resp 20   Ht 5\' 2"  (1.575 m)   Wt 140 lb (63.5 kg)   SpO2 92%   BMI 25.61 kg/m   HEMODYNAMICS:    VENTILATOR SETTINGS:    INTAKE / OUTPUT: I/O last 3 completed shifts: In: 7541.7 [P.O.:120; I.V.:3221.7; IV Piggyback:4200] Out: 850 [Urine:850]  PHYSICAL EXAMINATION: General: elderly female in NAD, appears younger than stated age   HEENT: MM pink/moist, no jvd Neuro: mild drowsiness, alert/oriented to self, place & events.  Forgetful of year.   CV: s1s2 rrr, no m/r/g PULM: even/non-labored, clear on L, rhonchi on R  DG:UYQI, non-tender, bsx4 active  Extremities: warm/dry, no edema  Skin: no rashes or lesions  LABS:  BMET Recent Labs  Lab 06/25/17 2006 06/26/17 0434 06/27/17 0550  NA 130* 133* 138  K 4.1 4.4 4.0  CL 93* 103 107  CO2 21* 18* 22  BUN 30* 27* 23*  CREATININE 2.33* 1.36* 0.75  GLUCOSE 107* 107* 89    Electrolytes Recent Labs  Lab 06/25/17 2006 06/26/17 0434 06/27/17 0550  CALCIUM 8.5* 7.2* 8.2*    CBC Recent Labs  Lab 06/25/17 2006 06/26/17 0434 06/27/17 0550  WBC 24.0* 28.2* 32.8*  HGB 13.0 12.4 11.6*  HCT 37.9 36.8 34.7*  PLT PLATELET CLUMPS NOTED ON SMEAR, UNABLE TO ESTIMATE 136* 76*    Coag's Recent Labs  Lab 06/25/17 2006  INR 1.30    Sepsis Markers Recent Labs  Lab 06/26/17 0006 06/26/17 0216 06/27/17 0550  LATICACIDVEN 3.79* 3.56* 1.4    ABG No results for input(s): PHART, PCO2ART, PO2ART in the last 168 hours.  Liver Enzymes Recent Labs  Lab 06/25/17 1614 06/25/17 2006  AST 33 51*  ALT 21 25  ALKPHOS 56 57  BILITOT 1.0 0.8  ALBUMIN 3.7 3.6    Cardiac Enzymes No results for input(s): TROPONINI, PROBNP in the last 168 hours.  Glucose Recent Labs  Lab 06/26/17 1323  06/26/17 1802 06/26/17 1941 06/27/17 0757 06/27/17 0833 06/27/17 1104  GLUCAP 72 68 71 64* 91 79    Imaging Dg Chest Port 1 View  Result Date: 06/27/2017 CLINICAL DATA:  Fever with cough and chills EXAM: PORTABLE CHEST 1 VIEW COMPARISON:  June 25, 2017 FINDINGS: There has been progression of airspace consolidation on the right, now with consolidation throughout much of the right mid and lower lung zones and more patchy infiltrate in the right upper lobe. There is also increase in infiltrate in the medial left upper lobe as well as in the medial left base. Heart is upper normal in size with pulmonary vascularity normal. No adenopathy. No evident bone lesions. IMPRESSION: There has been extensive progression of presumed pneumonia on the right with consolidation now throughout the right mid and lower lung zones as well as in the right upper lobe to a lesser extent. There is also increase in consolidation in the medial left upper lobe as well as new consolidation in the medial left base. Stable cardiac silhouette.  No adenopathy appreciable. Electronically Signed   By: Bretta Bang III M.D.   On: 06/27/2017 11:48     STUDIES:  2/21 CXR >> progression of RLL infiltrate  CULTURES: BCx2 2/19 >>  Sputum 2/19 >>   ANTIBIOTICS: Rocephin 2/19 >>  Azithromycin 2/19 >>   SIGNIFICANT EVENTS: 2/19  Admit with cough, SOB, RLL PNA  2/21  Tx to ICU  LINES/TUBES:   DISCUSSION: [name] admitted with RLL Strep Pneumo PNA.  Developed progressive respiratory distress, transferred to ICU 2/21.    ASSESSMENT / PLAN:  PULMONARY A: Strep Pneumo PNA, RLL  Acute Hypoxic Respiratory Failure P:   Now intubation PRVC 8cc/kg  Wean PEEP / FiO2 for sats > 90% May need ARDS low Vt ventilation  CXR post intubation  ABG one hour post intubation   CARDIOVASCULAR A:  Severe Sepsis - secondary to strep pneumo PNA Hx CAD, HLD P:  ICU monitoring   RENAL A:   AKI - clearing  Lactic  Acidosis - cleared, in setting of PNA/Sepsis P:   Trend BMP / urinary output Replace electrolytes as indicated Avoid nephrotoxic agents, ensure adequate renal perfusion  GASTROINTESTINAL A:   Hx GERD, Hepatic Steatosis, IBS  P:   NPO / OGT  Begin TF this evening   HEMATOLOGIC A:   Thrombocytopenia - followed by Dr. Clelia Croft as outpatient  Anemia - no evidence of bleeding  Leukocytosis  Hx ITP - on 2.5 mg Prednisone at baseline  P:  Trend CBC  Monitor for bleeding  Stress dose steroids   INFECTIOUS A:   Severe Sepsis in setting of Strep Pneumoniae PNA P:  ABX as above  Follow cultures   ENDOCRINE A:   At Risk Hyper / Hypoglycemia  P:   Trend glucose on BMP   NEUROLOGIC / ORTHO  A:   Hip Pain Hx Bursitis  P:   RASS goal: 0 to -1 Propofol for sedation  PRN fentanyl for pain    FAMILY  - Updates:  Daughter updated via phone per Dr. Kendrick Fries  - Inter-disciplinary family meet or Palliative Care meeting due by: 2/28   Canary Brim, NP-C [facility] Pgr: [phone] or if no answer [phone] 06/27/2017, 2:28 PM

## 2017-06-27 NOTE — Progress Notes (Signed)
After turning in the bed to get bathed and use the bedpan patient oxygen saturations were 84-87% on 3L, short of breath. This RN turned  patient back up to 5L O2. Saturations were 90-92%. Expiratory wheezing was ausculted. Paged MD concerning patient. PRN albuterol was ordered and administered with some relief. CXR was completed at bedside. Patient lethargic, but easy to arouse.

## 2017-06-27 NOTE — Progress Notes (Signed)
20mg  of Etomidate was given while pt was being intubated. Unable to chart in MAR>

## 2017-06-27 NOTE — Progress Notes (Signed)
Hypoglycemic Event  CBG: 64  Treatment: 15 GM carbohydrate snack  Symptoms: None  Follow-up CBG: Time:0830 CBG Result:91  Possible Reasons for Event: Inadequate meal intake  Comments/MD notified: Ordered meal tray for patient. Patient states she has not been eatin that well.    Derak Schurman L

## 2017-06-27 NOTE — Progress Notes (Addendum)
PROGRESS NOTE    Tamara Robertson  XLK:[id number] DOB: [birth year] DOA: 06/25/2017 PCP: Pincus Sanes, MD   Outpatient Specialists:     Brief Narrative:  Tamara Robertson is a 84 y.o. female with medical history significant of CAD, ITP on chronic 2.5mg  daily prednisone. Patient presented to PCPs office earlier today with c/o back pain, cough, fever, chills, nausea, diarrhea.  Tm at home 102.2.  EMS called, patient decided not to go to ER at that time.  Went to PCPs office today where RLL PNA diagnosed and patient came to hospital.     Assessment & Plan:   Principal Problem:   Community acquired pneumonia of right lower lobe of lung (HCC) Active Problems:   Severe sepsis with acute organ dysfunction (HCC)   AKI (acute kidney injury) (HCC)   Current chronic use of systemic steroids   Streptococcal pneumonia (HCC)   Acute respiratory failure due to Strep pna -IV abx continued, initially got better yesterday but today has worsening x ray and work of breathing -on 5L O2 but still de-sats with movement  Sepsis due to strep PNA -cultures pending -lactic acidosis resolved  H/o ITP on chronic steroids -stress dose steroids -trend plts -has seen Dr. Juliette Alcide in the past  AKI -resolved -monitor closely  Leukocytosis with bandemeia -From infection  +/- steroids -continues to increase  Hip pain -continue to monitor -has history of bursitis -call ortho in AM (has seen Delbert Harness-- requested Allusio but he is not on call)   Today patient has worsening physical exam and work of breathing-- plan to transfer to SDU with PCCM consultation, Dr. Kendrick Fries        DVT prophylaxis:  SCD  Code Status: Full Code   Family Communication: At bedside  Disposition Plan:  To SDU   Consultants:    Subjective: Sleepy  Objective: Vitals:   06/26/17 2203 06/27/17 0455 06/27/17 1200 06/27/17 1319  BP:  (!) 166/73 (!) 155/69 (!) 162/61  Pulse:  90 90 84  Resp:  19 (!) 22 20    Temp:  (!) 97.4 F (36.3 C) 97.7 F (36.5 C) 97.7 F (36.5 C)  TempSrc:  Oral Oral Oral  SpO2: 98% 94% 92% 92%  Weight:      Height:        Intake/Output Summary (Last 24 hours) at 06/27/2017 1402 Last data filed at 06/27/2017 3664 Gross per 24 hour  Intake 3691.67 ml  Output 1700 ml  Net 1991.67 ml   Filed Weights   06/25/17 2014  Weight: 63.5 kg (140 lb)    Examination:  General exam: increased work of breathing, appears ill Respiratory system: rhales through out with some wheezing Cardiovascular system: rrr Gastrointestinal system: +BS, soft Central nervous system: sleepy but can be awoke-- on re-eval sitting up trying to eat-- speaking well with daughter Extremities: moves all 4 ext Psychiatry: normal mood    Data Reviewed: I have personally reviewed following labs and imaging studies  CBC: Recent Labs  Lab 06/25/17 1614 06/25/17 2006 06/26/17 0434 06/27/17 0550  WBC 22.7* 24.0* 28.2* 32.8*  NEUTROABS 20.9* 21.9  --   --   HGB 13.2 13.0 12.4 11.6*  HCT 40.1 37.9 36.8 34.7*  MCV 84.9 84.0 84.4 84.8  PLT 179.0 PLATELET CLUMPS NOTED ON SMEAR, UNABLE TO ESTIMATE 136* 76*   Basic Metabolic Panel: Recent Labs  Lab 06/25/17 1614 06/25/17 2006 06/26/17 0434 06/27/17 0550  NA 131* 130* 133* 138  K 4.1 4.1 4.4 4.0  CL 92* 93* 103 107  CO2 27 21* 18* 22  GLUCOSE 112* 107* 107* 89  BUN 28* 30* 27* 23*  CREATININE 2.07* 2.33* 1.36* 0.75  CALCIUM 9.0 8.5* 7.2* 8.2*   GFR: Estimated Creatinine Clearance: 45 mL/min (by C-G formula based on SCr of 0.75 mg/dL). Liver Function Tests: Recent Labs  Lab 06/25/17 1614 06/25/17 2006  AST 33 51*  ALT 21 25  ALKPHOS 56 57  BILITOT 1.0 0.8  PROT 6.6 6.8  ALBUMIN 3.7 3.6   No results for input(s): LIPASE, AMYLASE in the last 168 hours. No results for input(s): AMMONIA in the last 168 hours. Coagulation Profile: Recent Labs  Lab 06/25/17 2006  INR 1.30   Cardiac Enzymes: No results for input(s):  CKTOTAL, CKMB, CKMBINDEX, TROPONINI in the last 168 hours. BNP (last 3 results) No results for input(s): PROBNP in the last 8760 hours. HbA1C: No results for input(s): HGBA1C in the last 72 hours. CBG: Recent Labs  Lab 06/26/17 1802 06/26/17 1941 06/27/17 0757 06/27/17 0833 06/27/17 1104  GLUCAP 68 71 64* 91 79   Lipid Profile: No results for input(s): CHOL, HDL, LDLCALC, TRIG, CHOLHDL, LDLDIRECT in the last 72 hours. Thyroid Function Tests: No results for input(s): TSH, T4TOTAL, FREET4, T3FREE, THYROIDAB in the last 72 hours. Anemia Panel: No results for input(s): VITAMINB12, FOLATE, FERRITIN, TIBC, IRON, RETICCTPCT in the last 72 hours. Urine analysis:    Component Value Date/Time   COLORURINE AMBER (A) 06/25/2017 2351   APPEARANCEUR CLOUDY (A) 06/25/2017 2351   LABSPEC 1.021 06/25/2017 2351   PHURINE 5.0 06/25/2017 2351   GLUCOSEU NEGATIVE 06/25/2017 2351   GLUCOSEU NEGATIVE 04/26/2016 1121   HGBUR NEGATIVE 06/25/2017 2351   BILIRUBINUR NEGATIVE 06/25/2017 2351   KETONESUR 5 (A) 06/25/2017 2351   PROTEINUR 30 (A) 06/25/2017 2351   UROBILINOGEN 0.2 04/26/2016 1121   NITRITE NEGATIVE 06/25/2017 2351   LEUKOCYTESUR NEGATIVE 06/25/2017 2351      Recent Results (from the past 240 hour(s))  Culture, blood (Routine x 2)     Status: None (Preliminary result)   Collection Time: 06/25/17  6:53 PM  Result Value Ref Range Status   Specimen Description   Final    BLOOD RIGHT ANTECUBITAL Performed at [facility], [address], [place], [place] [address]    Special Requests   Final    BOTTLES DRAWN AEROBIC AND ANAEROBIC Blood Culture adequate volume   Culture   Final    NO GROWTH < 24 HOURS Performed at [facility], [address], [place], [place] [address]    Report Status PENDING  Incomplete  Culture, blood (Routine x 2)     Status: None (Preliminary result)   Collection Time: 06/25/17  8:07 PM  Result Value Ref Range Status   Specimen  Description   Final    BLOOD LEFT HAND Performed at [facility], [address] [place], [place] [address]    Special Requests   Final    BOTTLES DRAWN AEROBIC AND ANAEROBIC Blood Culture adequate volume Performed at [facility], [address] [place], [place] [address]    Culture   Final    NO GROWTH < 24 HOURS Performed at [facility], [address], [place], [place] [address]    Report Status PENDING  Incomplete      Anti-infectives (From admission, onward)   Start     Dose/Rate Route Frequency Ordered Stop   06/27/17 1000  vancomycin (VANCOCIN) 1,250 mg in sodium chloride 0.9 % 250 mL IVPB     1,250 mg 166.7 mL/hr over 90 Minutes Intravenous Every 36 hours 06/27/17 0815     06/26/17 2200  azithromycin (ZITHROMAX) 500 mg in sodium chloride 0.9 % 250 mL IVPB     500 mg 250 mL/hr over 60 Minutes Intravenous Every 24 hours 06/25/17 2048     06/26/17 2100  cefTRIAXone (ROCEPHIN) 1 g in sodium chloride 0.9 % 100 mL IVPB     1 g 200 mL/hr over 30 Minutes Intravenous Every 24 hours 06/25/17 2048     06/25/17 2315  vancomycin (VANCOCIN) 1,500 mg in sodium chloride 0.9 % 500 mL IVPB     1,500 mg 250 mL/hr over 120 Minutes Intravenous  Once 06/25/17 2300 06/26/17 0108   06/25/17 2045  cefTRIAXone (ROCEPHIN) 1 g in sodium chloride 0.9 % 100 mL IVPB     1 g 200 mL/hr over 30 Minutes Intravenous  Once 06/25/17 2038 06/25/17 2135   06/25/17 2045  azithromycin (ZITHROMAX) 500 mg in sodium chloride 0.9 % 250 mL IVPB     500 mg 250 mL/hr over 60 Minutes Intravenous  Once 06/25/17 2038 06/25/17 2259       Radiology Studies: Dg Chest 2 View  Result Date: 06/25/2017 CLINICAL DATA:  Fever and cough. Left-sided posterior chest pain and fatigue x2 days. EXAM: CHEST  2 VIEW COMPARISON:  None. FINDINGS: Normal heart size with aortic atherosclerosis. No aneurysm. New pulmonary consolidation at the right lung base localizing predominantly within  the right lower lobe and possibly portions of the right middle lobe. No significant effusion. No pneumothorax. No acute osseous abnormality. Degenerative changes are seen along the dorsal spine. Cholecystectomy clips are present in the upper abdomen. IMPRESSION: 1. Pneumonic consolidation at the right lung base localizing predominantly within the right lower lobe and possibly within portions of the right middle lobe. These results will be called to the ordering clinician or representative by the Radiologist Assistant, and communication documented in the PACS or zVision Dashboard. 2. Aortic atherosclerosis. Electronically Signed   By: Tollie Eth M.D.   On: 06/25/2017 16:42   Dg Chest Port 1 View  Result Date: 06/27/2017 CLINICAL DATA:  Fever with cough and chills EXAM: PORTABLE CHEST 1 VIEW COMPARISON:  June 25, 2017 FINDINGS: There has been progression of airspace consolidation on the right, now with consolidation throughout much of the right mid and lower lung zones and more patchy infiltrate in the right upper lobe. There is also increase in infiltrate in the medial left upper lobe as well as in the medial left base. Heart is upper normal in size with pulmonary vascularity normal. No adenopathy. No evident bone lesions. IMPRESSION: There has been extensive progression of presumed pneumonia on the right with consolidation now throughout the right mid and lower lung zones as well as in the right upper lobe to a lesser extent. There is also increase in consolidation in the medial left upper lobe as well as new consolidation in the medial left base. Stable cardiac silhouette.  No adenopathy appreciable. Electronically Signed   By: Bretta Bang III M.D.   On: 06/27/2017 11:48   Dg Hip Unilat With Pelvis 2-3 Views Left  Result Date: 06/26/2017 CLINICAL DATA:  Sepsis, anterior hip pain for 2 years.  No injury. EXAM: DG HIP (WITH OR WITHOUT PELVIS) 2-3V LEFT COMPARISON:  None. FINDINGS: There is no  evidence of hip fracture or dislocation. There is no  evidence of arthropathy or other focal bone abnormality. Phleboliths project in the pelvis. IMPRESSION: Negative. Electronically Signed   By: Awilda Metro M.D.   On: 06/26/2017 02:57        Scheduled Meds: . calcium-vitamin D  1 tablet Oral Q breakfast  . famotidine  20 mg Oral Daily  . hydrocortisone sod succinate (SOLU-CORTEF) inj  50 mg Intravenous Q8H  . insulin aspart  0-5 Units Subcutaneous QHS  . insulin aspart  0-9 Units Subcutaneous TID WC  . ipratropium-albuterol  3 mL Nebulization Q6H  . latanoprost  1 drop Both Eyes QHS  . multivitamin with minerals  1 tablet Oral Daily  . pantoprazole  20 mg Oral Daily   Continuous Infusions: . sodium chloride 50 mL/hr at 06/27/17 1255  . azithromycin Stopped (06/26/17 2334)  . cefTRIAXone (ROCEPHIN)  IV Stopped (06/26/17 2234)  . vancomycin Stopped (06/27/17 1239)     LOS: 2 days    Time spent: 35 min    Joseph Art, DO [facility] Pager [phone]  If 7PM-7AM, please contact night-coverage [email or web address] Password [facility] 06/27/2017, 2:02 PM

## 2017-06-28 ENCOUNTER — Inpatient Hospital Stay (HOSPITAL_COMMUNITY): Payer: Medicare Other

## 2017-06-28 LAB — GLUCOSE, CAPILLARY
GLUCOSE-CAPILLARY: 122 mg/dL — AB (ref 65–99)
GLUCOSE-CAPILLARY: 119 mg/dL — AB (ref 65–99)
GLUCOSE-CAPILLARY: 100 mg/dL — AB (ref 65–99)
Glucose-Capillary: 118 mg/dL — ABNORMAL HIGH (ref 65–99)
Glucose-Capillary: 119 mg/dL — ABNORMAL HIGH (ref 65–99)
Glucose-Capillary: 122 mg/dL — ABNORMAL HIGH (ref 65–99)
Glucose-Capillary: 122 mg/dL — ABNORMAL HIGH (ref 65–99)
Glucose-Capillary: 125 mg/dL — ABNORMAL HIGH (ref 65–99)

## 2017-06-28 LAB — RESPIRATORY PANEL BY PCR
Adenovirus: NOT DETECTED
Bordetella pertussis: NOT DETECTED
CORONAVIRUS OC43-RVPPCR: NOT DETECTED
Chlamydophila pneumoniae: NOT DETECTED
Coronavirus 229E: NOT DETECTED
Coronavirus HKU1: NOT DETECTED
Coronavirus NL63: NOT DETECTED
INFLUENZA A-RVPPCR: NOT DETECTED
Influenza B: NOT DETECTED
MYCOPLASMA PNEUMONIAE-RVPPCR: NOT DETECTED
Metapneumovirus: NOT DETECTED
PARAINFLUENZA VIRUS 1-RVPPCR: NOT DETECTED
Parainfluenza Virus 2: NOT DETECTED
Parainfluenza Virus 3: NOT DETECTED
Parainfluenza Virus 4: NOT DETECTED
RESPIRATORY SYNCYTIAL VIRUS-RVPPCR: NOT DETECTED
Rhinovirus / Enterovirus: NOT DETECTED

## 2017-06-28 LAB — CBC
HCT: 31.3 % — ABNORMAL LOW (ref 36.0–46.0)
Hemoglobin: 10.5 g/dL — ABNORMAL LOW (ref 12.0–15.0)
MCH: 28.4 pg (ref 26.0–34.0)
MCHC: 33.5 g/dL (ref 30.0–36.0)
MCV: 84.6 fL (ref 78.0–100.0)
PLATELETS: 76 10*3/uL — AB (ref 150–400)
RBC: 3.7 MIL/uL — ABNORMAL LOW (ref 3.87–5.11)
RDW: 15.1 % (ref 11.5–15.5)
WBC: 29.1 10*3/uL — AB (ref 4.0–10.5)

## 2017-06-28 LAB — BASIC METABOLIC PANEL
Anion gap: 9 (ref 5–15)
BUN: 23 mg/dL — AB (ref 6–20)
CALCIUM: 8.1 mg/dL — AB (ref 8.9–10.3)
CO2: 21 mmol/L — ABNORMAL LOW (ref 22–32)
Chloride: 108 mmol/L (ref 101–111)
Creatinine, Ser: 0.65 mg/dL (ref 0.44–1.00)
GFR calc Af Amer: >60 mL/min (ref >60)
Glucose, Bld: 135 mg/dL — ABNORMAL HIGH (ref 65–99)
POTASSIUM: 3.5 mmol/L (ref 3.5–5.1)
SODIUM: 138 mmol/L (ref 135–145)

## 2017-06-28 LAB — TRIGLYCERIDES: TRIGLYCERIDES: 690 mg/dL — AB (ref <150)

## 2017-06-28 LAB — MRSA PCR SCREENING: MRSA by PCR: NEGATIVE

## 2017-06-28 LAB — LACTIC ACID, PLASMA: LACTIC ACID: 6.4 mmol/L — AB (ref 0.4–1.8)

## 2017-06-28 MED ORDER — SODIUM CHLORIDE 0.9 % IV SOLN
25.0000 ug/h | INTRAVENOUS | Status: DC
  Administered 2017-06-28: 50 ug/h via INTRAVENOUS
  Filled 2017-06-28 (×2): qty 50

## 2017-06-28 MED ORDER — HYDRALAZINE HCL 20 MG/ML IJ SOLN
10.0000 mg | INTRAMUSCULAR | Status: DC | PRN
  Administered 2017-06-29: 40 mg via INTRAVENOUS
  Filled 2017-06-28 (×2): qty 1

## 2017-06-28 MED ORDER — VITAL HIGH PROTEIN PO LIQD
1000.0000 mL | ORAL | Status: DC
  Administered 2017-06-28 – 2017-06-30 (×3): 1000 mL
  Filled 2017-06-28 (×4): qty 1000

## 2017-06-28 MED ORDER — FENTANYL CITRATE (PF) 100 MCG/2ML IJ SOLN
50.0000 ug | Freq: Once | INTRAMUSCULAR | Status: AC
  Administered 2017-06-28: 50 ug via INTRAVENOUS
  Filled 2017-06-28: qty 2

## 2017-06-28 MED ORDER — FENTANYL 2500MCG IN NS 250ML (10MCG/ML) PREMIX INFUSION
25.0000 ug/h | INTRAVENOUS | Status: DC
Start: 2017-06-28 — End: 2017-07-01
  Administered 2017-06-28: 175 ug/h via INTRAVENOUS
  Administered 2017-06-29: 200 ug/h via INTRAVENOUS
  Administered 2017-06-30: 250 ug/h via INTRAVENOUS
  Administered 2017-06-30: 100 ug/h via INTRAVENOUS
  Administered 2017-06-30: 325 ug/h via INTRAVENOUS
  Administered 2017-07-01: 200 ug/h via INTRAVENOUS
  Filled 2017-06-28 (×6): qty 250

## 2017-06-28 MED ORDER — POTASSIUM CHLORIDE 20 MEQ/15ML (10%) PO SOLN
20.0000 meq | ORAL | Status: AC
  Administered 2017-06-28 (×2): 20 meq
  Filled 2017-06-28 (×2): qty 15

## 2017-06-28 MED ORDER — MIDAZOLAM HCL 2 MG/2ML IJ SOLN
1.0000 mg | INTRAMUSCULAR | Status: DC | PRN
Start: 2017-06-28 — End: 2017-07-01
  Administered 2017-06-28 – 2017-07-01 (×7): 1 mg via INTRAVENOUS
  Filled 2017-06-28 (×7): qty 2

## 2017-06-28 MED ORDER — INSULIN ASPART 100 UNIT/ML ~~LOC~~ SOLN
0.0000 [IU] | SUBCUTANEOUS | Status: DC
  Administered 2017-06-28 – 2017-07-01 (×8): 2 [IU] via SUBCUTANEOUS
  Administered 2017-07-01 (×2): 3 [IU] via SUBCUTANEOUS
  Administered 2017-07-01 – 2017-07-02 (×2): 2 [IU] via SUBCUTANEOUS
  Administered 2017-07-02: 3 [IU] via SUBCUTANEOUS
  Administered 2017-07-02 (×3): 2 [IU] via SUBCUTANEOUS
  Administered 2017-07-02: 3 [IU] via SUBCUTANEOUS
  Administered 2017-07-03 (×2): 2 [IU] via SUBCUTANEOUS
  Administered 2017-07-03: 3 [IU] via SUBCUTANEOUS
  Administered 2017-07-03 – 2017-07-05 (×4): 2 [IU] via SUBCUTANEOUS

## 2017-06-28 MED ORDER — FREE WATER
100.0000 mL | Freq: Four times a day (QID) | Status: DC
  Administered 2017-06-28 – 2017-07-01 (×12): 100 mL

## 2017-06-28 MED ORDER — ARTIFICIAL TEARS OPHTHALMIC OINT
TOPICAL_OINTMENT | Freq: Three times a day (TID) | OPHTHALMIC | Status: DC
  Administered 2017-06-28 – 2017-06-30 (×7): via OPHTHALMIC
  Administered 2017-07-01: 1 via OPHTHALMIC
  Administered 2017-07-01 (×2): via OPHTHALMIC
  Administered 2017-07-02: 1 via OPHTHALMIC
  Administered 2017-07-02 – 2017-07-04 (×6): via OPHTHALMIC
  Filled 2017-06-28: qty 3.5

## 2017-06-28 MED ORDER — PRO-STAT SUGAR FREE PO LIQD
30.0000 mL | Freq: Three times a day (TID) | ORAL | Status: DC
  Administered 2017-06-28 – 2017-07-01 (×9): 30 mL
  Filled 2017-06-28 (×8): qty 30

## 2017-06-28 MED ORDER — FENTANYL BOLUS VIA INFUSION
25.0000 ug | INTRAVENOUS | Status: DC | PRN
  Administered 2017-06-28 – 2017-06-29 (×5): 25 ug via INTRAVENOUS
  Filled 2017-06-28: qty 25

## 2017-06-28 MED ORDER — POTASSIUM CHLORIDE 20 MEQ/15ML (10%) PO SOLN
20.0000 meq | Freq: Once | ORAL | Status: AC
  Administered 2017-06-28: 20 meq via ORAL
  Filled 2017-06-28: qty 15

## 2017-06-28 NOTE — Progress Notes (Signed)
Initial Nutrition Assessment  DOCUMENTATION CODES:   Not applicable  INTERVENTION:  Will adjust TF regimen: Vital High Protein @ 25 mL/hr with 30 mL Prostat TID and 100 mL free water QID. This regimen + kcal from current Propofol rate will provide 1243 kcal (102% estimated kcal need), 97 grams of protein, and 902 mL free water.    NUTRITION DIAGNOSIS:   Inadequate oral intake related to inability to eat as evidenced by NPO status.  GOAL:   Patient will meet greater than or equal to 90% of their needs  MONITOR:   Vent status, Weight trends, Labs  REASON FOR ASSESSMENT:   Ventilator, Consult Enteral/tube feeding initiation and management  ASSESSMENT:   [name] admitted 2/19 with reports of fever, cough, right lower back pain and concern for pneumonia. PMH CAD, ITP on chronic prednisone (2.5mg  QD).  Found to have strep pneumoniae PNA.  Progressed to respiratory failure, intubated 2/21.   BMI indicates overweight status, appropriate for age. Pt is intubated, sedated, with OGT in place at this time. Pt's son is at bedside and reports that pt has a good appetite at baseline with no changes in appetite until a few days ago. He states that pt went out to eat on Monday (2/18) and ate well at that time. He states she did not eat well for dinner on Wednesday (2/20) d/t not liking the food but is unsure of intakes yesterday.   She is currently receiving Vital High Protein @ 40 mL/hr with 30 mL Prostat BID and 30 mL free water every 4 hours. This regimen is providing 1160 kcal, 84 grams of protein, and 982 mL free water. Per Brandi's, PCCM NP, note this AM: RLL PNA, severe sepsis secondary to PNA, hip pain with hx of bursitis.   Patient is currently intubated on ventilator support MV: 5.6 L/min Temp (24hrs), Avg:98.3 F (36.8 C), Min:97.7 F (36.5 C), Max:99.2 F (37.3 C) Propofol: 13 ml/hr (343 kcal)  Medications reviewed; 1 tablet Oscal-D per OGT/day, 20 mg Pepcid per OGT/day, 50 mg  Solu-cortef TID, sliding scale Novolog, 15 mL liquid vitamin per OGT/day, 20 mEq KCl per OGT x3 doses today. Labs reviewed; CBGs: 122 x2 and 119 mg/dL this AM, BUN: 23 mg/dL, Ca: 8.1 mg/dL.  IVF: NS @ 50 mL/hr. Drips: Fentanyl @ 75 mcg/hr, Propofol @ 30 mcg/kg/min.     NUTRITION - FOCUSED PHYSICAL EXAM:  Completed/assessed with no muscle and no fat wasting noted.   Diet Order:  Diet NPO time specified  EDUCATION NEEDS:   No education needs have been identified at this time  Skin:  Skin Assessment: Reviewed RN Assessment  Last BM:  2/19  Height:   Ht Readings from Last 1 Encounters:  06/27/17 5\' 2"  (1.575 m)    Weight:   Wt Readings from Last 1 Encounters:  06/28/17 162 lb 11.2 oz (73.8 kg)    Ideal Body Weight:  50 kg  BMI:  Body mass index is 29.76 kg/m.  Estimated Nutritional Needs:   Kcal:  1215  Protein:  79-99 grams (1.2-1.5 grams/kg)  Fluid:  >/= 1.5 L/day      Jarome Matin, MS, RD, LDN, [facility] Inpatient Clinical Dietitian Pager # [phone] After hours/weekend pager # [phone]

## 2017-06-28 NOTE — Consult Note (Addendum)
PULMONARY / CRITICAL CARE MEDICINE   Name: Tamara Robertson MRN: [id number] DOB: 1929-08-17    ADMISSION DATE:  06/25/2017 CONSULTATION DATE:  06/27/17  REFERRING MD:  Dr. Benjamine Mola / [facility]   CHIEF COMPLAINT:  Respiratory distress   BRIEF SUMMARY:  [name] admitted 2/19 with reports of fever, cough, right lower back pain and concern for pneumonia. PMH CAD, ITP on chronic prednisone (2.5mg  QD).  Found to have strep pneumoniae PNA.  Progressed to respiratory failure, intubated 2/21.     SUBJECTIVE:  RN reports pt failed SBT with increased work of breathing.  Noted triglycerides elevated / on propofol.  Pt indicates she has a sore throat.     VITAL SIGNS: BP (!) 174/89 (BP Location: Left Arm)   Pulse (!) 112   Temp 99.2 F (37.3 C) (Axillary)   Resp (!) 27   Ht 5\' 2"  (1.575 m)   Wt 162 lb 11.2 oz (73.8 kg)   SpO2 94%   BMI 29.76 kg/m   HEMODYNAMICS:    VENTILATOR SETTINGS: Vent Mode: PRVC FiO2 (%):  [40 %-100 %] 40 % Set Rate:  [18 bmp] 18 bmp Vt Set:  [400 mL] 400 mL PEEP:  [5 cmH20] 5 cmH20 Pressure Support:  [5 cmH20] 5 cmH20 Plateau Pressure:  [13 cmH20-16 cmH20] 13 cmH20  INTAKE / OUTPUT: I/O last 3 completed shifts: In: 3991.8 [I.V.:3121.8; NG/GT:520; IV Piggyback:350] Out: 2550 [Urine:2550]  PHYSICAL EXAMINATION: General: elderly female in NAD on vent  HEENT: MM pink/moist, ETT Neuro: Awake, alert, follows commands  CV: s1s2 rrr, SEM 2-3/6  PULM: even/non-labored, coarse on R, diminished lower  WU:JWJX, non-tender, bsx4 active  Extremities: warm/dry, no edema  Skin: no rashes or lesions  LABS:  BMET Recent Labs  Lab 06/26/17 0434 06/27/17 0550 06/28/17 0303  NA 133* 138 138  K 4.4 4.0 3.5  CL 103 107 108  CO2 18* 22 21*  BUN 27* 23* 23*  CREATININE 1.36* 0.75 0.65  GLUCOSE 107* 89 135*    Electrolytes Recent Labs  Lab 06/26/17 0434 06/27/17 0550 06/28/17 0303  CALCIUM 7.2* 8.2* 8.1*    CBC Recent Labs  Lab 06/26/17 0434 06/27/17 0550  06/28/17 0303  WBC 28.2* 32.8* 29.1*  HGB 12.4 11.6* 10.5*  HCT 36.8 34.7* 31.3*  PLT 136* 76* 76*    Coag's Recent Labs  Lab 06/25/17 2006  INR 1.30    Sepsis Markers Recent Labs  Lab 06/26/17 0006 06/26/17 0216 06/27/17 0550  LATICACIDVEN 3.79* 3.56* 1.4    ABG Recent Labs  Lab 06/27/17 1728  PHART 7.340*  PCO2ART 42.7  PO2ART 322*    Liver Enzymes Recent Labs  Lab 06/25/17 1614 06/25/17 2006  AST 33 51*  ALT 21 25  ALKPHOS 56 57  BILITOT 1.0 0.8  ALBUMIN 3.7 3.6    Cardiac Enzymes No results for input(s): TROPONINI, PROBNP in the last 168 hours.  Glucose Recent Labs  Lab 06/27/17 1104 06/27/17 1623 06/27/17 2008 06/28/17 0001 06/28/17 0413 06/28/17 0738  GLUCAP 79 105* 107* 122* 119* 122*    Imaging Portable Chest X-ray  Result Date: 06/27/2017 CLINICAL DATA:  Endotracheal and orogastric tube placement. EXAM: PORTABLE CHEST 1 VIEW COMPARISON:  06/27/2017 at 1116 hours FINDINGS: An endotracheal tube terminates 1.5 cm above the carina. An enteric tube courses into the left upper abdomen with tip and side hole beyond the inferior margin of the image. The cardiomediastinal silhouette is unchanged. Consolidation is again seen throughout the right mid and  lower lung. Patchy right upper lobe airspace opacity may have mildly progressed. Patchy left perihilar/left upper lobe and left basilar airspace opacities are stable to minimally progressed. Small pleural effusions are suspected bilaterally. No pneumothorax is identified. IMPRESSION: 1. Endotracheal tube 1.5 cm above the carina. 2. Enteric tube reaches the stomach with tip not imaged. 3. Stable to slight worsening of right greater than left lung consolidation concerning for pneumonia. Electronically Signed   By: Sebastian Ache M.D.   On: 06/27/2017 17:05   Dg Chest Port 1 View  Result Date: 06/27/2017 CLINICAL DATA:  Fever with cough and chills EXAM: PORTABLE CHEST 1 VIEW COMPARISON:  June 25, 2017  FINDINGS: There has been progression of airspace consolidation on the right, now with consolidation throughout much of the right mid and lower lung zones and more patchy infiltrate in the right upper lobe. There is also increase in infiltrate in the medial left upper lobe as well as in the medial left base. Heart is upper normal in size with pulmonary vascularity normal. No adenopathy. No evident bone lesions. IMPRESSION: There has been extensive progression of presumed pneumonia on the right with consolidation now throughout the right mid and lower lung zones as well as in the right upper lobe to a lesser extent. There is also increase in consolidation in the medial left upper lobe as well as new consolidation in the medial left base. Stable cardiac silhouette.  No adenopathy appreciable. Electronically Signed   By: Bretta Bang III M.D.   On: 06/27/2017 11:48     STUDIES:  2/21 CXR >> progression of RLL infiltrate 2/22 ECHO >>   CULTURES: BCx2 2/19 >>  Sputum 2/19 >>  RVP 2/22 >>  ANTIBIOTICS: Rocephin 2/19 >>  Azithromycin 2/19 >>   SIGNIFICANT EVENTS: 2/19  Admit with cough, SOB, RLL PNA  2/21  Tx to ICU  LINES/TUBES:   DISCUSSION: [name] admitted with RLL Strep Pneumo PNA.  Developed progressive respiratory distress, transferred to ICU 2/21.    ASSESSMENT / PLAN:  PULMONARY A: Strep Pneumo PNA, RLL  Acute Hypoxic Respiratory Failure P:   PRVC 8cc/kg  Wean PEEP / FiO2 for sats > 90% Follow CXR intermittently  See ID   Daily SBT / WUA as able   CARDIOVASCULAR A:  Severe Sepsis - secondary to strep pneumo PNA Hx CAD, HLD Murmur - no documented hx  Hypertriglyceridemia - on propofol  P:  ICU monitoring  Assess ECHO  Repeat triglycerides on 2/24  PRN hydralazine   RENAL A:   AKI - clearing  Lactic Acidosis - cleared, in setting of PNA/Sepsis P:   Trend BMP / urinary output Replace electrolytes as indicated, KCL 20 mEq x1 Avoid nephrotoxic agents,  ensure adequate renal perfusion  GASTROINTESTINAL A:   Hx GERD, Hepatic Steatosis, IBS  P:   NPO / NGT  TF per Nutrition  Bowel regimen: colace BID PRN  HEMATOLOGIC A:   Thrombocytopenia - followed by Dr. Clelia Croft as outpatient  Anemia - no evidence of bleeding  Leukocytosis  Hx ITP - on 2.5 mg Prednisone at baseline  P:  Trend CBC  Stress dose steroids (2.5mg  pred dependent at baseline)  Monitor for bleeding   INFECTIOUS A:   Severe Sepsis in setting of Strep Pneumoniae PNA P:   ABX as above  Await RVP  Cultures as above   ENDOCRINE A:   At Risk Hyper / Hypoglycemia  P:   SSI Q4   NEUROLOGIC / ORTHO  A:   Hip Pain Hx Bursitis  P:   RASS goal: 0 to-1  Change propofol to fentanyl given elevated triglycerides  PRN versed for sedation     FAMILY  - Updates:  No family at bedside am 2/22  - Inter-disciplinary family meet or Palliative Care meeting due by: 2/28   Canary Brim, NP-C [facility] Pgr: [phone] or if no answer [phone] 06/28/2017, 8:42 AM

## 2017-06-28 NOTE — Progress Notes (Signed)
[facility] ADULT ICU REPLACEMENT PROTOCOL FOR AM LAB REPLACEMENT ONLY  The patient does apply for the [facility] Adult ICU Electrolyte Replacment Protocol based on the criteria listed below:   1. Is GFR >/= 40 ml/min? Yes.    Patient's GFR today is >60 2. Is urine output >/= 0.5 ml/kg/hr for the last 6 hours? Yes.   Patient's UOP is 1.9 ml/kg/hr 3. Is BUN < 60 mg/dL? Yes.    Patient's BUN today is 23 4. Abnormal electrolyte(s): 3.5 5. Ordered repletion with: per protocol 6. If a panic level lab has been reported, has the CCM MD in charge been notified? Yes.  .   Physician:  Dr. Haskell Riling, Philis Nettle 06/28/2017 5:23 AM

## 2017-06-28 NOTE — Progress Notes (Signed)
ET tube at 22 at the lip. Per doctor RT withdrew the ET Tube @cm . Rt will continue to monitor

## 2017-06-29 ENCOUNTER — Inpatient Hospital Stay (HOSPITAL_COMMUNITY): Payer: Medicare Other

## 2017-06-29 DIAGNOSIS — I340 Nonrheumatic mitral (valve) insufficiency: Secondary | ICD-10-CM

## 2017-06-29 DIAGNOSIS — I361 Nonrheumatic tricuspid (valve) insufficiency: Secondary | ICD-10-CM

## 2017-06-29 LAB — BASIC METABOLIC PANEL
Anion gap: 10 (ref 5–15)
BUN: 29 mg/dL — AB (ref 6–20)
CHLORIDE: 113 mmol/L — AB (ref 101–111)
CO2: 23 mmol/L (ref 22–32)
CREATININE: 0.56 mg/dL (ref 0.44–1.00)
Calcium: 8.6 mg/dL — ABNORMAL LOW (ref 8.9–10.3)
GFR calc Af Amer: >60 mL/min (ref >60)
GFR calc non Af Amer: >60 mL/min (ref >60)
Glucose, Bld: 98 mg/dL (ref 65–99)
Potassium: 3.8 mmol/L (ref 3.5–5.1)
Sodium: 146 mmol/L — ABNORMAL HIGH (ref 135–145)

## 2017-06-29 LAB — ECHOCARDIOGRAM COMPLETE
AO mean calculated velocity dopler: 209 cm/s
AOVTI: 58.9 cm
AV Area mean vel: 1.12 cm2
AV Mean grad: 20 mmHg
AV Peak grad: 34 mmHg
AV VEL mean LVOT/AV: 0.49
AV area mean vel ind: 0.64 cm2/m2
AV peak Index: 0.61
AVAREAVTI: 1.07 cm2
AVAREAVTIIND: 0.68 cm2/m2
AVLVOTPG: 8 mmHg
AVPKVEL: 292 cm/s
Ao pk vel: 0.47 m/s
Ao-asc: 28 cm
CHL CUP AV VALUE AREA INDEX: 0.68
CHL CUP AV VEL: 1.19
CHL CUP MV DEC (S): 151
CHL CUP STROKE VOLUME: 38 mL
EERAT: 15.41
EWDT: 151 ms
FS: 35 % (ref 28–44)
HEIGHTINCHES: 62 in
IV/PV OW: 1.06
LA ID, A-P, ES: 34 mm
LA diam end sys: 34 mm
LA diam index: 1.94 cm/m2
LAVOL: 38.7 mL
LAVOLA4C: 34.4 mL
LAVOLIN: 22.1 mL/m2
LV E/e' medial: 15.41
LV E/e'average: 15.41
LV PW d: 13.9 mm — AB (ref 0.6–1.1)
LV TDI E'LATERAL: 7.4
LV dias vol index: 30 mL/m2
LV e' LATERAL: 7.4 cm/s
LV sys vol: 14 mL (ref 14–42)
LVDIAVOL: 52 mL (ref 46–106)
LVOT SV: 70 mL
LVOT VTI: 30.9 cm
LVOT area: 2.27 cm2
LVOT peak VTI: 0.52 cm
LVOT peak vel: 137 cm/s
LVOTD: 17 mm
LVSYSVOLIN: 8 mL/m2
MV pk A vel: 120 m/s
MVPG: 5 mmHg
MVPKEVEL: 114 m/s
RV LATERAL S' VELOCITY: 11.5 cm/s
RV sys press: 37 mmHg
Reg peak vel: 268 cm/s
Simpson's disk: 73
TAPSE: 18.3 mm
TDI e' medial: 6.2
TR max vel: 268 cm/s
Valve area: 1.19 cm2
WEIGHTICAEL: 2582.03 [oz_av]

## 2017-06-29 LAB — CBC
HCT: 33.9 % — ABNORMAL LOW (ref 36.0–46.0)
Hemoglobin: 11.6 g/dL — ABNORMAL LOW (ref 12.0–15.0)
MCH: 28.5 pg (ref 26.0–34.0)
MCHC: 34.2 g/dL (ref 30.0–36.0)
MCV: 83.3 fL (ref 78.0–100.0)
PLATELETS: 120 10*3/uL — AB (ref 150–400)
RBC: 4.07 MIL/uL (ref 3.87–5.11)
RDW: 15.4 % (ref 11.5–15.5)
WBC: 23.6 10*3/uL — ABNORMAL HIGH (ref 4.0–10.5)

## 2017-06-29 LAB — GLUCOSE, CAPILLARY
GLUCOSE-CAPILLARY: 115 mg/dL — AB (ref 65–99)
GLUCOSE-CAPILLARY: 122 mg/dL — AB (ref 65–99)
Glucose-Capillary: 114 mg/dL — ABNORMAL HIGH (ref 65–99)
Glucose-Capillary: 127 mg/dL — ABNORMAL HIGH (ref 65–99)
Glucose-Capillary: 84 mg/dL (ref 65–99)
Glucose-Capillary: 96 mg/dL (ref 65–99)

## 2017-06-29 MED ORDER — LABETALOL HCL 5 MG/ML IV SOLN
10.0000 mg | INTRAVENOUS | Status: DC | PRN
  Administered 2017-06-29 – 2017-07-01 (×5): 10 mg via INTRAVENOUS
  Filled 2017-06-29 (×4): qty 4

## 2017-06-29 MED ORDER — HYDRALAZINE HCL 20 MG/ML IJ SOLN
10.0000 mg | INTRAMUSCULAR | Status: DC | PRN
  Administered 2017-07-01 – 2017-07-02 (×4): 10 mg via INTRAVENOUS
  Filled 2017-06-29 (×4): qty 1

## 2017-06-29 NOTE — Progress Notes (Signed)
Patient returned to full support due to increased agitation and increased RR. RN at bedside with patient. RT will continue to monitor patient.

## 2017-06-29 NOTE — Progress Notes (Signed)
Patient receiving echo. Will attempt wean after procedure.

## 2017-06-29 NOTE — Consult Note (Signed)
PULMONARY / CRITICAL CARE MEDICINE   Name: Tamara Robertson MRN: [id number] DOB: 06-15-1929    ADMISSION DATE:  06/25/2017 CONSULTATION DATE:  06/27/17  REFERRING MD:  Dr. Benjamine Mola / [facility]   CHIEF COMPLAINT:  Respiratory distress   BRIEF SUMMARY:  [name] admitted 2/19 with reports of fever, cough, right lower back pain and concern for pneumonia. PMH CAD, ITP on chronic prednisone (2.5mg  QD).  Found to have strep pneumoniae PNA.  Progressed to respiratory failure, intubated 2/21.     SUBJECTIVE:  RN reports pt failed SBT with increased work of breathing.  Noted triglycerides elevated / on propofol.  Pt indicates she has a sore throat.     VITAL SIGNS: BP (!) 133/44   Pulse 84   Temp 98 F (36.7 C) (Axillary)   Resp 18   Ht 5\' 2"  (1.575 m)   Wt 161 lb 6 oz (73.2 kg)   SpO2 95%   BMI 29.52 kg/m   HEMODYNAMICS:    VENTILATOR SETTINGS: Vent Mode: PRVC FiO2 (%):  [40 %-50 %] 50 % Set Rate:  [18 bmp] 18 bmp Vt Set:  [400 mL] 400 mL PEEP:  [5 cmH20] 5 cmH20 Plateau Pressure:  [12 cmH20-24 cmH20] 24 cmH20  INTAKE / OUTPUT: I/O last 3 completed shifts: In: 4260 [I.V.:2813.3; NG/GT:1246.7; IV Piggyback:200] Out: 2415 [Urine:2415]  PHYSICAL EXAMINATION: General: elderly female in NAD on vent  HEENT: MM pink/moist, ETT Neuro: Awake, alert, follows commands  CV: s1s2 rrr, SEM 2-3/6  PULM: even/non-labored, coarse on R, diminished lower  WU:JWJX, non-tender, bsx4 active  Extremities: warm/dry, no edema  Skin: no rashes or lesions  LABS:  BMET Recent Labs  Lab 06/27/17 0550 06/28/17 0303 06/29/17 0329  NA 138 138 146*  K 4.0 3.5 3.8  CL 107 108 113*  CO2 22 21* 23  BUN 23* 23* 29*  CREATININE 0.75 0.65 0.56  GLUCOSE 89 135* 98    Electrolytes Recent Labs  Lab 06/27/17 0550 06/28/17 0303 06/29/17 0329  CALCIUM 8.2* 8.1* 8.6*    CBC Recent Labs  Lab 06/27/17 0550 06/28/17 0303 06/29/17 0329  WBC 32.8* 29.1* 23.6*  HGB 11.6* 10.5* 11.6*  HCT 34.7* 31.3*  33.9*  PLT 76* 76* 120*    Coag's Recent Labs  Lab 06/25/17 2006  INR 1.30    Sepsis Markers Recent Labs  Lab 06/26/17 0006 06/26/17 0216 06/27/17 0550  LATICACIDVEN 3.79* 3.56* 1.4    ABG Recent Labs  Lab 06/27/17 1728  PHART 7.340*  PCO2ART 42.7  PO2ART 322*    Liver Enzymes Recent Labs  Lab 06/25/17 1614 06/25/17 2006  AST 33 51*  ALT 21 25  ALKPHOS 56 57  BILITOT 1.0 0.8  ALBUMIN 3.7 3.6    Cardiac Enzymes No results for input(s): TROPONINI, PROBNP in the last 168 hours.  Glucose Recent Labs  Lab 06/28/17 1659 06/28/17 1949 06/28/17 2328 06/29/17 0335 06/29/17 0750 06/29/17 1201  GLUCAP 100* 125* 118* 84 96 127*    Imaging Dg Chest Port 1 View  Result Date: 06/29/2017 CLINICAL DATA:  Acute respiratory failure with hypoxia. Ventilator support. EXAM: PORTABLE CHEST 1 VIEW COMPARISON:  06/28/2017 FINDINGS: Endotracheal tube tip is 1 cm above the carina. Nasogastric tube enters the abdomen. Bilateral airspace filling pattern is similar, again worse on the right than the left. Possible small amount of pleural fluid. No worsening or new finding. IMPRESSION: Endotracheal tube and nasogastric tube well positioned. Persistent bilateral airspace filling right worse than left. Electronically  Signed   By: Paulina Fusi M.D.   On: 06/29/2017 06:45     STUDIES:  2/21 CXR >> progression of RLL infiltrate 2/22 ECHO >>   CULTURES: BCx2 2/19 >>  Sputum 2/19 >>  RVP 2/22 >>  ANTIBIOTICS: Rocephin 2/19 >>  Azithromycin 2/19 >>   SIGNIFICANT EVENTS: 2/19  Admit with cough, SOB, RLL PNA  2/21  Tx to ICU  LINES/TUBES:   DISCUSSION: [name] admitted with RLL Strep Pneumo PNA.  Developed progressive respiratory distress, transferred to ICU 2/21.    ASSESSMENT / PLAN:  PULMONARY A: Strep Pneumo PNA, RLL  ventilatory dependent respiratory failure secondary to acute Hypoxic Respiratory Failure secondary to strep pneumonia P:   PRVC 8cc/kg  Wean  PEEP / FiO2 for sats > 90% Follow CXR intermittently.  Chest x-ray 06/29/2017 reviewed.  Minimal improvement compared to x-ray on 06/27/2017. See ID   Daily SBT / WUA as able   CARDIOVASCULAR A:  Severe Sepsis - secondary to strep pneumo PNA Hx CAD, HLD Murmur - no documented hx  Hypertriglyceridemia - on propofol  P:  ICU monitoring  Echo shows normal LV with a EF of 60-65%.  Moderate aortic stenosis Repeat triglycerides on 2/24  PRN hydralazine.  Patient blood pressures been stable and has not required pressors.  RENAL A:   AKI - clearing  Lactic Acidosis - cleared, in setting of PNA/Sepsis P:   Trend BMP / urinary output Replace electrolytes as indicated Avoid nephrotoxic agents, ensure adequate renal perfusion.  Creatinine back to baseline.  GASTROINTESTINAL A:   Hx GERD, Hepatic Steatosis, IBS  P:   NPO / NGT  TF per Nutrition  Bowel regimen: colace BID PRN  HEMATOLOGIC A:   Thrombocytopenia - followed by Dr. Clelia Croft as outpatient  Anemia - no evidence of bleeding  Leukocytosis  Hx ITP - on 2.5 mg Prednisone at baseline  P:  Trend CBC  Stress dose steroids (2.5mg  pred dependent at baseline)  Monitor for bleeding. hemoglobin stable.  No signs of active bleeding at this time.  Platelets have increased from 79-120.  INFECTIOUS A:   Severe Sepsis in setting of Strep Pneumoniae PNA P:   ABX as above  Await RVP  Cultures as above   leukocytosis has significantly improved.  ENDOCRINE A:   At Risk Hyper / Hypoglycemia  P:   SSI Q4   NEUROLOGIC / ORTHO  A:   Hip Pain Hx Bursitis  P:   RASS goal: 0 to-1  Change propofol to fentanyl given elevated triglycerides  Daily sedation vacation.  Will DC Versed    FAMILY  - Updates: Discussed the case with family at bedside  On on 06/29/2017.  Patient's son and daughter-in-law were at bedside.  Updated them on patient progress including her hemoglobin, leukocytosis, platelets.  Updated them on the echo findings.   All the questions were answered.  - Inter-disciplinary family meet or Palliative Care meeting due by: 2/28   Lionel December D.O [facility] Pager: [phone]

## 2017-06-29 NOTE — Progress Notes (Addendum)
Pharmacy Antibiotic Note  Tamara Robertson is a 84 y.o. female admitted on 06/25/2017 with pneumonia.  Pharmacy has been consulted for ceftriaxone dosing. Plan: Continue Ceftriaxone 2gm IV q24h Monitor clinical course, renal function, cultures as available  Dosage remains stable at above dose and need for further dosage adjustment appears unlikely at present.    Will sign off at this time.  Please reconsult if a change in clinical status warrants re-evaluation of dosage.      Height: 5\' 2"  (157.5 cm) Weight: 161 lb 6 oz (73.2 kg) IBW/kg (Calculated) : 50.1  Temp (24hrs), Avg:98.7 F (37.1 C), Min:97.8 F (36.6 C), Max:99.5 F (37.5 C)  Recent Labs  Lab 06/25/17 2006 06/25/17 2025 06/25/17 2211 06/26/17 0006 06/26/17 0216 06/26/17 0434 06/27/17 0550 06/28/17 0303 06/29/17 0329  WBC 24.0*  --   --   --   --  28.2* 32.8* 29.1* 23.6*  CREATININE 2.33*  --   --   --   --  1.36* 0.75 0.65 0.56  LATICACIDVEN  --  5.20* 5.30* 3.79* 3.56*  --  1.4  --   --     Estimated Creatinine Clearance: 48.1 mL/min (by C-G formula based on SCr of 0.56 mg/dL).    Allergies  Allergen Reactions  . Aspirin Other (See Comments)    ITP  . Sulfa Antibiotics Other (See Comments)    dizziness  . Contrast Media [Iodinated Diagnostic Agents] Hives  . Lasix [Furosemide] Rash  . Statins Other (See Comments)     Thank you for allowing pharmacy to be a part of this patient's care.  Royetta Asal, PharmD, BCPS Pager [phone] 06/29/2017 8:08 AM

## 2017-06-29 NOTE — Progress Notes (Signed)
  Echocardiogram 2D Echocardiogram has been performed.  Tamara Robertson 06/29/2017, 8:41 AM

## 2017-06-30 LAB — GLUCOSE, CAPILLARY
GLUCOSE-CAPILLARY: 121 mg/dL — AB (ref 65–99)
Glucose-Capillary: 105 mg/dL — ABNORMAL HIGH (ref 65–99)
Glucose-Capillary: 112 mg/dL — ABNORMAL HIGH (ref 65–99)
Glucose-Capillary: 113 mg/dL — ABNORMAL HIGH (ref 65–99)
Glucose-Capillary: 117 mg/dL — ABNORMAL HIGH (ref 65–99)
Glucose-Capillary: 132 mg/dL — ABNORMAL HIGH (ref 65–99)

## 2017-06-30 LAB — CULTURE, BLOOD (ROUTINE X 2)
Culture: NO GROWTH
Culture: NO GROWTH
Special Requests: ADEQUATE
Special Requests: ADEQUATE

## 2017-06-30 LAB — TRIGLYCERIDES: TRIGLYCERIDES: 193 mg/dL — AB (ref <150)

## 2017-06-30 MED ORDER — QUETIAPINE FUMARATE 50 MG PO TABS
50.0000 mg | ORAL_TABLET | Freq: Two times a day (BID) | ORAL | Status: DC
  Administered 2017-06-30 – 2017-07-01 (×4): 50 mg via ORAL
  Filled 2017-06-30 (×4): qty 1

## 2017-06-30 NOTE — Progress Notes (Signed)
Respiratory therapist at bedside.  Pts volumes low.  Suspected mucus plug in ETT .  RT to lavage and bag.  Very difficult at first then thick secretions suctioned out and volumes improved greatly.  Pt given additional sedation medicine for comfort.  Family updated at bedside.   Will monitor closely

## 2017-06-30 NOTE — Consult Note (Addendum)
PULMONARY / CRITICAL CARE MEDICINE   Name: Tamara Robertson MRN: [id number] DOB: 01-Aug-1929    ADMISSION DATE:  06/25/2017 CONSULTATION DATE:  06/27/17  REFERRING MD:  Dr. Benjamine Mola / [facility]   CHIEF COMPLAINT:  Respiratory distress   BRIEF SUMMARY:  [name] admitted 2/19 with reports of fever, cough, right lower back pain and concern for pneumonia. PMH CAD, ITP on chronic prednisone (2.5mg  QD).  Found to have strep pneumoniae PNA.  Progressed to respiratory failure, intubated 2/21.     SUBJECTIVE:  Had decreased tidal volumes which improved after suctioning. Failed spontaneous breathing trial due to agitation.  Pulled out 1 of her peripheral IV lines and was also reaching for the endotracheal tube and so she was briefly restrained and later placed in mittens.   VITAL SIGNS: BP (!) 161/42 (BP Location: Right Arm)   Pulse (!) 110   Temp 98.8 F (37.1 C) (Oral)   Resp 18   Ht 5\' 2"  (1.575 m)   Wt 165 lb 12.6 oz (75.2 kg)   SpO2 95%   BMI 30.32 kg/m   HEMODYNAMICS:    VENTILATOR SETTINGS: Vent Mode: PRVC FiO2 (%):  [30 %-40 %] 35 % Set Rate:  [18 bmp] 18 bmp Vt Set:  [400 mL] 400 mL PEEP:  [5 cmH20] 5 cmH20 Plateau Pressure:  [18 cmH20-22 cmH20] 20 cmH20  INTAKE / OUTPUT: I/O last 3 completed shifts: In: 4116.1 [I.V.:2666.1; NG/GT:1250; IV Piggyback:200] Out: 1675 [Urine:1675]  PHYSICAL EXAMINATION: General: elderly female in NAD on vent  HEENT: MM pink/moist, ETT Neuro: sedated, not spots of the verbal stimuli CV: s1s2 rrr, SEM 2-3/6  PULM: even/non-labored, coarse rhonchi anteriorly GM:WNUU, non-tender Extremities: warm/dry, no edema  Skin: no rashes or lesions  LABS:  BMET Recent Labs  Lab 06/27/17 0550 06/28/17 0303 06/29/17 0329  NA 138 138 146*  K 4.0 3.5 3.8  CL 107 108 113*  CO2 22 21* 23  BUN 23* 23* 29*  CREATININE 0.75 0.65 0.56  GLUCOSE 89 135* 98    Electrolytes Recent Labs  Lab 06/27/17 0550 06/28/17 0303 06/29/17 0329  CALCIUM 8.2* 8.1* 8.6*     CBC Recent Labs  Lab 06/27/17 0550 06/28/17 0303 06/29/17 0329  WBC 32.8* 29.1* 23.6*  HGB 11.6* 10.5* 11.6*  HCT 34.7* 31.3* 33.9*  PLT 76* 76* 120*    Coag's Recent Labs  Lab 06/25/17 2006  INR 1.30    Sepsis Markers Recent Labs  Lab 06/26/17 0006 06/26/17 0216 06/27/17 0550  LATICACIDVEN 3.79* 3.56* 1.4    ABG Recent Labs  Lab 06/27/17 1728  PHART 7.340*  PCO2ART 42.7  PO2ART 322*    Liver Enzymes Recent Labs  Lab 06/25/17 1614 06/25/17 2006  AST 33 51*  ALT 21 25  ALKPHOS 56 57  BILITOT 1.0 0.8  ALBUMIN 3.7 3.6    Cardiac Enzymes No results for input(s): TROPONINI, PROBNP in the last 168 hours.  Glucose Recent Labs  Lab 06/29/17 1201 06/29/17 1551 06/29/17 1953 06/29/17 2337 06/30/17 0406 06/30/17 0804  GLUCAP 127* 115* 122* 114* 112* 113*    Imaging No results found.   STUDIES:  2/21 CXR >> progression of RLL infiltrate 2/22 ECHO >>   CULTURES: BCx2 2/19 >>  Sputum 2/19 >>  RVP 2/22 >> Urine strep +  ANTIBIOTICS: Rocephin 2/19 >>  Azithromycin 2/19 >>   SIGNIFICANT EVENTS: 2/19  Admit with cough, SOB, RLL PNA  2/21  Tx to ICU  LINES/TUBES:   DISCUSSION: 84  y/o F admitted with RLL Strep Pneumo PNA.  Developed progressive respiratory distress, transferred to ICU 2/21.    ASSESSMENT / PLAN:  PULMONARY A: Strep Pneumo PNA, RLL  ventilatory dependent respiratory failure secondary to acute Hypoxic Respiratory Failure secondary to strep pneumonia P:   PRVC 8cc/kg  Wean PEEP / FiO2 for sats > 90% Follow CXR intermittently.  Chest x-ray 06/29/2017 reviewed.  Minimal improvement compared to x-ray on 06/27/2017. See ID   Daily SBT / WUA as able -ventilator requirements decreasing and now minimal however failed spontaneous breathing trial on 2/24 due to agitation  CARDIOVASCULAR A:  Severe Sepsis - secondary to strep pneumo PNA Hx CAD, HLD Murmur - no documented hx  Hypertriglyceridemia - on propofol  P:  ICU  monitoring  Echo shows normal LV with a EF of 60-65%.  Moderate aortic stenosis Repeat triglycerides on 2/24 downtrending PRN hydralazine, suspect hypertension is largely due to agitation  RENAL A:   AKI - clearing  Lactic Acidosis - cleared, in setting of PNA/Sepsis P:   Trend BMP / urinary output Replace electrolytes as indicated Avoid nephrotoxic agents, ensure adequate renal perfusion.  Creatinine back to baseline.  GASTROINTESTINAL A:   Hx GERD, Hepatic Steatosis, IBS  P:   NPO / NGT  TF per Nutrition  Bowel regimen: colace BID PRN  HEMATOLOGIC A:   Thrombocytopenia - followed by Dr. Clelia Croft as outpatient  Anemia - no evidence of bleeding  Leukocytosis  Hx ITP - on 2.5 mg Prednisone at baseline  P:  Trend CBC  Stress dose steroids (2.5mg  pred dependent at baseline)  Monitor for bleeding. hemoglobin stable.  No signs of active bleeding at this time.  Platelets have increased from 79-120.  INFECTIOUS A:   Severe Sepsis in setting of Strep Pneumoniae PNA P:   ABX as above  Await RVP  Cultures as above   leukocytosis has significantly improved.  ENDOCRINE A:   At Risk Hyper / Hypoglycemia  P:   SSI Q4   NEUROLOGIC / ORTHO  A:   Hip Pain Hx Bursitis  Agitation, suspect ICU delirium as well as resolving sepsis P:   RASS goal: 0 to-1  Changed propofol to fentanyl given elevated triglycerides  Daily sedation vacation Seroquel 50 mg twice daily started 2/24 for agitation, will attempt to wean fentanyl in order to optimize neuro status in preparation for extubation   FAMILY  - Updates: Discussed the case with family at bedside  On on 06/29/2017.  Patient's son and daughter-in-law were at bedside.  Updated them on patient progress including her hemoglobin, leukocytosis, platelets.  Updated them on the echo findings.  All the questions were answered. -2/24 -updated patient's son in person and daughter over the phone.  I explained that overall she shows small  improvement and at this point we are waiting for her delirium to resolve in order to try extubation.  For this we are treating her underlying sepsis and also starting antipsychotics as above.  Her children mentioned that at the onset of intubation the primary team had discussed that this would be a temporary measure, I explained that I fully agree with this decision and because she is showing small improvements that we would continue to follow her clinical course and readdress goals of care should she not continue to progress  - Inter-disciplinary family meet or Palliative Care meeting due by: 2/28   [place] Danira Nylander, MD [facility] Pulmonology/Critical Care Pager [phone] After hours pager: [phone]

## 2017-07-01 ENCOUNTER — Inpatient Hospital Stay (HOSPITAL_COMMUNITY): Payer: Medicare Other

## 2017-07-01 LAB — BASIC METABOLIC PANEL
ANION GAP: 12 (ref 5–15)
BUN: 40 mg/dL — AB (ref 6–20)
CALCIUM: 8.9 mg/dL (ref 8.9–10.3)
CO2: 24 mmol/L (ref 22–32)
Chloride: 114 mmol/L — ABNORMAL HIGH (ref 101–111)
Creatinine, Ser: 0.58 mg/dL (ref 0.44–1.00)
GFR calc Af Amer: >60 mL/min (ref >60)
GLUCOSE: 154 mg/dL — AB (ref 65–99)
Potassium: 3.9 mmol/L (ref 3.5–5.1)
SODIUM: 150 mmol/L — AB (ref 135–145)

## 2017-07-01 LAB — CBC
HCT: 33.7 % — ABNORMAL LOW (ref 36.0–46.0)
Hemoglobin: 11.3 g/dL — ABNORMAL LOW (ref 12.0–15.0)
MCH: 28.3 pg (ref 26.0–34.0)
MCHC: 33.5 g/dL (ref 30.0–36.0)
MCV: 84.5 fL (ref 78.0–100.0)
Platelets: 298 10*3/uL (ref 150–400)
RBC: 3.99 MIL/uL (ref 3.87–5.11)
RDW: 15.8 % — AB (ref 11.5–15.5)
WBC: 22.1 10*3/uL — ABNORMAL HIGH (ref 4.0–10.5)

## 2017-07-01 LAB — GLUCOSE, CAPILLARY
GLUCOSE-CAPILLARY: 177 mg/dL — AB (ref 65–99)
Glucose-Capillary: 135 mg/dL — ABNORMAL HIGH (ref 65–99)
Glucose-Capillary: 129 mg/dL — ABNORMAL HIGH (ref 65–99)
Glucose-Capillary: 158 mg/dL — ABNORMAL HIGH (ref 65–99)

## 2017-07-01 LAB — PROCALCITONIN: PROCALCITONIN: 3.24 ng/mL

## 2017-07-01 MED ORDER — FREE WATER
200.0000 mL | Freq: Four times a day (QID) | Status: DC
  Administered 2017-07-01: 200 mL

## 2017-07-01 MED ORDER — FUROSEMIDE 10 MG/ML IJ SOLN
40.0000 mg | Freq: Once | INTRAMUSCULAR | Status: AC
Start: 2017-07-01 — End: 2017-07-01
  Administered 2017-07-01: 40 mg via INTRAVENOUS
  Filled 2017-07-01: qty 4

## 2017-07-01 MED ORDER — FREE WATER
250.0000 mL | Status: DC
  Administered 2017-07-01 – 2017-07-02 (×5): 250 mL

## 2017-07-01 MED ORDER — LABETALOL HCL 100 MG PO TABS
100.0000 mg | ORAL_TABLET | Freq: Three times a day (TID) | ORAL | Status: DC
  Administered 2017-07-01 (×2): 100 mg
  Filled 2017-07-01 (×2): qty 1

## 2017-07-01 MED ORDER — FENTANYL CITRATE (PF) 100 MCG/2ML IJ SOLN
50.0000 ug | INTRAMUSCULAR | Status: DC | PRN
  Administered 2017-07-01 – 2017-07-02 (×3): 50 ug via INTRAVENOUS
  Filled 2017-07-01 (×2): qty 2

## 2017-07-01 MED ORDER — DEXMEDETOMIDINE HCL IN NACL 200 MCG/50ML IV SOLN
0.0000 ug/kg/h | INTRAVENOUS | Status: DC
  Administered 2017-07-01: 1 ug/kg/h via INTRAVENOUS
  Administered 2017-07-01 (×2): 1.2 ug/kg/h via INTRAVENOUS
  Administered 2017-07-01: 0.6 ug/kg/h via INTRAVENOUS
  Administered 2017-07-01 (×2): 1 ug/kg/h via INTRAVENOUS
  Administered 2017-07-02 (×2): 1.2 ug/kg/h via INTRAVENOUS
  Filled 2017-07-01 (×8): qty 50

## 2017-07-01 MED ORDER — VITAL AF 1.2 CAL PO LIQD
1000.0000 mL | ORAL | Status: DC
  Administered 2017-07-01 – 2017-07-02 (×2): 1000 mL
  Filled 2017-07-01 (×3): qty 1000

## 2017-07-01 MED ORDER — MIDAZOLAM HCL 2 MG/2ML IJ SOLN
4.0000 mg | Freq: Once | INTRAMUSCULAR | Status: AC
  Administered 2017-07-01: 4 mg via INTRAVENOUS
  Filled 2017-07-01: qty 4

## 2017-07-01 MED ORDER — LABETALOL HCL 5 MG/ML IV SOLN: 10.0000 mg | INTRAVENOUS | Status: DC | PRN

## 2017-07-01 MED ORDER — FUROSEMIDE 10 MG/ML IJ SOLN
40.0000 mg | Freq: Once | INTRAMUSCULAR | Status: AC
  Administered 2017-07-01: 40 mg via INTRAVENOUS
  Filled 2017-07-01: qty 4

## 2017-07-01 MED ORDER — DEXTROSE 5 % IV SOLN
INTRAVENOUS | Status: DC
  Administered 2017-07-01 – 2017-07-06 (×5): via INTRAVENOUS

## 2017-07-01 MED ORDER — FENTANYL CITRATE (PF) 100 MCG/2ML IJ SOLN
50.0000 ug | INTRAMUSCULAR | Status: DC | PRN
  Administered 2017-07-01: 50 ug via INTRAVENOUS
  Filled 2017-07-01: qty 2

## 2017-07-01 MED ORDER — PREDNISONE 5 MG/5ML PO SOLN
40.0000 mg | Freq: Every day | ORAL | Status: DC
  Administered 2017-07-01 – 2017-07-02 (×2): 40 mg
  Filled 2017-07-01 (×2): qty 40

## 2017-07-01 MED ORDER — LABETALOL HCL 5 MG/ML IV SOLN
10.0000 mg | INTRAVENOUS | Status: DC | PRN
  Administered 2017-07-01 (×2): 10 mg via INTRAVENOUS

## 2017-07-01 NOTE — Progress Notes (Signed)
Nutrition Follow-up  DOCUMENTATION CODES:   Not applicable  INTERVENTION:  - Will adjust TF regimen: Vital AF 1.2 @ 50 mL/hr to provide 1440 kcal, 90 grams of protein, and 973 mL free water. - Free water flush per MD/NP: order for 200 mL QID (800 mL/day) ordered to start this afternoon.  - PEPuP protocol.   NUTRITION DIAGNOSIS:   Inadequate oral intake related to inability to eat as evidenced by NPO status. -ongoing  GOAL:   Patient will meet greater than or equal to 90% of their needs -unmet at this time; will be met with change to TF regimen  MONITOR:   Vent status, TF tolerance, Weight trends  ASSESSMENT:   [name] admitted 2/19 with reports of fever, cough, right lower back pain and concern for pneumonia. PMH CAD, ITP on chronic prednisone (2.14m QD).  Found to have strep pneumoniae PNA.  Progressed to respiratory failure, intubated 2/21.   Pt remains intubated with OGT in place. She is receiving Vital High Protein @ 25 mL/hr with 30 mL Prostat TID and 100 mL free water QID. This regimen is providing 900 kcal, 97 grams of protein, and 902 mL free water. Propofol has been turned off since the last RD note (2/22). Weight trending up since admission. Weight +3.2 kg since 2/21 so weight from that date (72.3 kg) used in re-estimating needs.  Per [facility], PCCM NP, note this AM: "failing SBT due to agitated encephalopathy, volume overload." Hopeful for extubation in 24-48 hours.    Patient is currently intubated on ventilator support MV: 8 L/min Temp (24hrs), Avg:99.3 F (37.4 C), Min:98.9 F (37.2 C), Max:100.2 F (37.9 C) Propofol: none BP: 192/55 and MAP: 104  Medications reviewed; 1 tablet Oscal with D per OGT/day, 20 mg Pepcid per OGT/day, 40 mg IV Lasix x1 dose today, 40 mg Prednisone per OGT/day, sliding scale Novolog, 15 mL liquid multivitamin per OGT/day. Labs reviewed; CBGs: 135 and 129 mg/dL this AM, triglycerides: 193 mg/dL yesterday.  Drip: Precedex @ 1 mcg/kg/hr.     Diet Order:  Diet NPO time specified  EDUCATION NEEDS:   No education needs have been identified at this time  Skin:  Skin Assessment: Reviewed RN Assessment  Last BM:  2/25  Height:   Ht Readings from Last 1 Encounters:  06/29/17 5' 2"  (1.575 m)    Weight:   Wt Readings from Last 1 Encounters:  07/01/17 166 lb 7.2 oz (75.5 kg)    Ideal Body Weight:  50 kg  BMI:  Body mass index is 30.44 kg/m.  Estimated Nutritional Needs:   Kcal:  1448  Protein:  79-99 grams (1.2-1.5 grams/kg)  Fluid:  >/= 1.5 L/day      JJarome Matin MS, RD, LDN, CBaptist Medical Center - AttalaInpatient Clinical Dietitian Pager # [phone] hours/weekend pager # [phone]

## 2017-07-01 NOTE — Progress Notes (Signed)
PULMONARY / CRITICAL CARE MEDICINE   Name: Tamara Robertson MRN: [id number] DOB: Mar 15, 1930    ADMISSION DATE:  06/25/2017 CONSULTATION DATE:  06/27/17  REFERRING MD:  Dr. Benjamine Mola / [facility]   CHIEF COMPLAINT:  Respiratory distress   BRIEF SUMMARY:  [name] admitted 2/19 with reports of fever, cough, right lower back pain and concern for pneumonia. PMH CAD, ITP on chronic prednisone (2.5mg  QD).  Found to have strep pneumoniae PNA.  Progressed to respiratory failure, intubated 2/21.     SUBJECTIVE:   Intermittently agitated, reaching for endotracheal tube, will not follow commands currently VITAL SIGNS: BP (!) 170/51   Pulse 97   Temp 99.1 F (37.3 C) (Axillary)   Resp 15   Ht 5\' 2"  (1.575 m)   Wt 166 lb 7.2 oz (75.5 kg)   SpO2 95%   BMI 30.44 kg/m   HEMODYNAMICS:    VENTILATOR SETTINGS: Vent Mode: PRVC FiO2 (%):  [30 %-40 %] 35 % Set Rate:  [18 bmp] 18 bmp Vt Set:  [400 mL] 400 mL PEEP:  [5 cmH20] 5 cmH20 Pressure Support:  [10 cmH20] 10 cmH20 Plateau Pressure:  [19 cmH20-20 cmH20] 19 cmH20 Filed Weights   06/29/17 0500 06/30/17 0500 07/01/17 0400  Weight: 161 lb 6 oz (73.2 kg) 165 lb 12.6 oz (75.2 kg) 166 lb 7.2 oz (75.5 kg)   INTAKE / OUTPUT:  Intake/Output Summary (Last 24 hours) at 07/01/2017 4696 Last data filed at 07/01/2017 0300 Gross per 24 hour  Intake 2158.81 ml  Output 1025 ml  Net 1133.81 ml     PHYSICAL EXAMINATION: General: This is an 84 year old white female sedated on a fentanyl infusion.  Currently on full ventilator support HEENT: Normocephalic atraumatic orally intubated no jugular venous distention Cardiac: Regular rate and rhythm.  Aortic murmur appreciated approximately 3 out of 6 Extremities/musculoskeletal: Warm and dry has dependent edema in the lower extremities.  Has right upper extremity edema consistent with what appears to be possible infiltrate Abdomen: Soft, nontender, tolerating tube feeds.  Last BM documented on 2/24 Neuro/psych: Awake.   Not focal, reaches for endotracheal tube, will not follow commands  LABS:  BMET Recent Labs  Lab 06/27/17 0550 06/28/17 0303 06/29/17 0329  NA 138 138 146*  K 4.0 3.5 3.8  CL 107 108 113*  CO2 22 21* 23  BUN 23* 23* 29*  CREATININE 0.75 0.65 0.56  GLUCOSE 89 135* 98    Electrolytes Recent Labs  Lab 06/27/17 0550 06/28/17 0303 06/29/17 0329  CALCIUM 8.2* 8.1* 8.6*    CBC Recent Labs  Lab 06/27/17 0550 06/28/17 0303 06/29/17 0329  WBC 32.8* 29.1* 23.6*  HGB 11.6* 10.5* 11.6*  HCT 34.7* 31.3* 33.9*  PLT 76* 76* 120*    Coag's Recent Labs  Lab 06/25/17 2006  INR 1.30    Sepsis Markers Recent Labs  Lab 06/26/17 0006 06/26/17 0216 06/27/17 0550  LATICACIDVEN 3.79* 3.56* 1.4    ABG Recent Labs  Lab 06/27/17 1728  PHART 7.340*  PCO2ART 42.7  PO2ART 322*    Liver Enzymes Recent Labs  Lab 06/25/17 1614 06/25/17 2006  AST 33 51*  ALT 21 25  ALKPHOS 56 57  BILITOT 1.0 0.8  ALBUMIN 3.7 3.6    Cardiac Enzymes No results for input(s): TROPONINI, PROBNP in the last 168 hours.  Glucose Recent Labs  Lab 06/30/17 1322 06/30/17 1739 06/30/17 1947 06/30/17 2350 07/01/17 0327 07/01/17 0752  GLUCAP 121* 117* 132* 105* 135* 129*  Imaging No results found.   STUDIES:  2/21 CXR >> progression of RLL infiltrate 2/22 ECHO >> systolic EF 60-65%. Wall motion nml, + diastolic dysfxn but not able to determine grade. Mode AS  CULTURES: BCx2 2/19 >> neg Sputum 2/19 >>  RVP 2/22 >>neg Urine strep +  ANTIBIOTICS: Rocephin 2/19 >>  Azithromycin 2/19 >> 2/20  SIGNIFICANT EVENTS: 2/19  Admit with cough, SOB, RLL PNA  2/21  Tx to ICU  LINES/TUBES:   DISCUSSION: [name] admitted with RLL Strep Pneumo PNA.  Developed progressive respiratory distress, transferred to ICU 2/21.   Transitioning focus to delirium therapy, hope to be extubated in the next 24-48 hours.  ASSESSMENT / PLAN:  Acute Hypoxic respiratory failure in setting of  Pneumococcal PNA and ALI -weaning efforts complicated by sedation needs. Nursing reports either sedated or extremely agitated.  PCXR reviewed personally: ett good position. Overall improved aeration and decrease in R>L airspace disease. Has persistent R>L basilar volume loss. Looks like mix of effusion and atx vs pna Plan Change fent to precedex RASS goal 0 Lasix X 1 Repeat SBT after sedation transition  Cont weaning efforts hope extubation in next 24 to 40 hrs Day # 7/8 rocephin   Acute encephalopathy -->this has been major barrier to extubation and weaning Plan Dc fent gtt Start precedex Cont seroquel at current dose (50mg  q 12) Taper steroids No more benzos Correct water imbalance  Get OOB  Hx CAD, HLD Murmur - no documented hx; echo showing mild AS Hypertriglyceridemia - on propofol -->improved after stopping  Plan Cont tele  PRN hydralazine (in past BP has been on low side so will hold off on scheduled antihypertensives for now) Lasix x1 (need to be careful w/ preload reduction in setting of AS) Will need to f/u w/ cards again after dc   Fluid and electrolyte imbalance: Hypernatremia and hyperchloremia  Plan Cont free water F/u chem this am. Anticipate we will need to address water balance further    Chronic Thrombocytopenia - followed by Dr. Clelia Croft as outpatient; Hx ITP - on 2.5 mg Prednisone at baseline  Plan Repeat cbc this am  Taper steroids (change to oral pred w/ taper over 5d to get ack to baseline dosing)   Hx GERD, Hepatic Steatosis, IBS  Plan Cont H2B  Mild anemia w/out evidence of bleeding Plan Trend CBC Transfuse per protocol   Mild Hyperglycemia  Plan SSI   Hip Pain Hx Bursitis  Plan PRN fent (redused doing).     DVT prophylaxis: scds SUP: H2B Diet: tubefeeds Activity: OOB Disposition : ICU    My ccm time 55 minutes.   Simonne Martinet ACNP-BC [facility] Pulmonary/Critical Care Pager # [phone] OR # [phone] if no answer

## 2017-07-01 NOTE — Progress Notes (Signed)
Pt transported to CT scan and back to room 1227 while on the ventilator.  Pt remained stable and comfortable throughout trip.  No complications.

## 2017-07-01 NOTE — Progress Notes (Signed)
Despite several interventions Nursing staff unable to lower patient BP. Multiple PRN medication given to try to lower BP. Orders placed by NP to try to lower BP.  Around 1830 patient BP still elevated and patient appears to be posturing. CCM MD notified. Stat CT ordered and 4 mg versed ordered.

## 2017-07-02 ENCOUNTER — Inpatient Hospital Stay (HOSPITAL_COMMUNITY): Payer: Medicare Other

## 2017-07-02 LAB — GLUCOSE, CAPILLARY
GLUCOSE-CAPILLARY: 141 mg/dL — AB (ref 65–99)
Glucose-Capillary: 133 mg/dL — ABNORMAL HIGH (ref 65–99)
Glucose-Capillary: 165 mg/dL — ABNORMAL HIGH (ref 65–99)
Glucose-Capillary: 145 mg/dL — ABNORMAL HIGH (ref 65–99)
Glucose-Capillary: 147 mg/dL — ABNORMAL HIGH (ref 65–99)

## 2017-07-02 LAB — CBC
HEMATOCRIT: 30.2 % — AB (ref 36.0–46.0)
Hemoglobin: 10.2 g/dL — ABNORMAL LOW (ref 12.0–15.0)
MCH: 28.4 pg (ref 26.0–34.0)
MCHC: 33.8 g/dL (ref 30.0–36.0)
MCV: 84.1 fL (ref 78.0–100.0)
Platelets: 361 10*3/uL (ref 150–400)
RBC: 3.59 MIL/uL — AB (ref 3.87–5.11)
RDW: 15.5 % (ref 11.5–15.5)
WBC: 23.8 10*3/uL — AB (ref 4.0–10.5)

## 2017-07-02 LAB — BASIC METABOLIC PANEL
ANION GAP: 12 (ref 5–15)
BUN: 35 mg/dL — AB (ref 6–20)
CHLORIDE: 105 mmol/L (ref 101–111)
CO2: 29 mmol/L (ref 22–32)
Calcium: 8.3 mg/dL — ABNORMAL LOW (ref 8.9–10.3)
Creatinine, Ser: 0.59 mg/dL (ref 0.44–1.00)
Glucose, Bld: 162 mg/dL — ABNORMAL HIGH (ref 65–99)
POTASSIUM: 3 mmol/L — AB (ref 3.5–5.1)
SODIUM: 146 mmol/L — AB (ref 135–145)

## 2017-07-02 LAB — MAGNESIUM: MAGNESIUM: 2 mg/dL (ref 1.7–2.4)

## 2017-07-02 LAB — PROCALCITONIN: Procalcitonin: 2.3 ng/mL

## 2017-07-02 LAB — PHOSPHORUS: PHOSPHORUS: 2.8 mg/dL (ref 2.5–4.6)

## 2017-07-02 MED ORDER — QUETIAPINE FUMARATE 50 MG PO TABS
50.0000 mg | ORAL_TABLET | Freq: Every day | ORAL | Status: DC
  Administered 2017-07-02 – 2017-07-03 (×2): 50 mg via ORAL
  Filled 2017-07-02 (×2): qty 1

## 2017-07-02 MED ORDER — FENTANYL 2500MCG IN NS 250ML (10MCG/ML) PREMIX INFUSION
0.0000 ug/h | INTRAVENOUS | Status: DC
  Administered 2017-07-02: 100 ug/h via INTRAVENOUS
  Filled 2017-07-02: qty 250

## 2017-07-02 MED ORDER — FREE WATER
300.0000 mL | Status: DC
  Administered 2017-07-02 – 2017-07-03 (×5): 300 mL

## 2017-07-02 MED ORDER — HYDRALAZINE HCL 20 MG/ML IJ SOLN: 10.0000 mg | INTRAMUSCULAR | Status: DC | PRN

## 2017-07-02 MED ORDER — MELATONIN 5 MG PO TABS
5.0000 mg | ORAL_TABLET | Freq: Every day | ORAL | Status: DC
  Administered 2017-07-02 – 2017-07-03 (×2): 5 mg
  Filled 2017-07-02 (×3): qty 1

## 2017-07-02 MED ORDER — PREDNISONE 5 MG/5ML PO SOLN
20.0000 mg | Freq: Every day | ORAL | Status: DC
  Administered 2017-07-03: 20 mg
  Filled 2017-07-02: qty 20

## 2017-07-02 MED ORDER — POTASSIUM CHLORIDE 20 MEQ/15ML (10%) PO SOLN
30.0000 meq | ORAL | Status: AC
  Administered 2017-07-02 (×2): 30 meq
  Filled 2017-07-02 (×2): qty 30

## 2017-07-02 MED ORDER — DEXMEDETOMIDINE HCL IN NACL 200 MCG/50ML IV SOLN
0.0000 ug/kg/h | INTRAVENOUS | Status: DC
  Administered 2017-07-02: 1 ug/kg/h via INTRAVENOUS
  Administered 2017-07-02: 1.1 ug/kg/h via INTRAVENOUS
  Administered 2017-07-02: 0.4 ug/kg/h via INTRAVENOUS
  Administered 2017-07-02 – 2017-07-03 (×7): 1.2 ug/kg/h via INTRAVENOUS
  Filled 2017-07-02 (×10): qty 50

## 2017-07-02 MED ORDER — NICARDIPINE HCL IN NACL 20-0.86 MG/200ML-% IV SOLN
3.0000 mg/h | INTRAVENOUS | Status: DC
  Administered 2017-07-02: 5 mg/h via INTRAVENOUS
  Filled 2017-07-02 (×2): qty 200

## 2017-07-02 MED ORDER — NON FORMULARY: 5.0000 mg | Freq: Every day | Status: DC

## 2017-07-02 MED ORDER — QUETIAPINE FUMARATE 100 MG PO TABS
100.0000 mg | ORAL_TABLET | Freq: Every day | ORAL | Status: DC
  Administered 2017-07-02: 100 mg via ORAL
  Filled 2017-07-02: qty 1

## 2017-07-02 MED ORDER — FENTANYL CITRATE (PF) 100 MCG/2ML IJ SOLN
100.0000 ug | INTRAMUSCULAR | Status: DC | PRN
Start: 2017-07-02 — End: 2017-07-02

## 2017-07-02 MED ORDER — LIDOCAINE 5 % EX PTCH
1.0000 | MEDICATED_PATCH | CUTANEOUS | Status: DC
  Administered 2017-07-02 – 2017-07-04 (×3): 1 via TRANSDERMAL
  Filled 2017-07-02 (×3): qty 1

## 2017-07-02 MED ORDER — POTASSIUM CHLORIDE 20 MEQ/15ML (10%) PO SOLN
40.0000 meq | Freq: Once | ORAL | Status: AC
  Administered 2017-07-02: 40 meq
  Filled 2017-07-02: qty 30

## 2017-07-02 MED ORDER — SODIUM CHLORIDE 0.9 % IV SOLN
0.0000 ug/h | INTRAVENOUS | Status: DC
  Filled 2017-07-02: qty 50

## 2017-07-02 MED ORDER — FENTANYL CITRATE (PF) 100 MCG/2ML IJ SOLN: 100.0000 ug | INTRAMUSCULAR | Status: DC | PRN

## 2017-07-02 MED ORDER — TORSEMIDE 20 MG PO TABS
20.0000 mg | ORAL_TABLET | Freq: Every day | ORAL | Status: DC
  Administered 2017-07-02 – 2017-07-03 (×2): 20 mg
  Filled 2017-07-02 (×2): qty 1

## 2017-07-02 NOTE — Progress Notes (Addendum)
PT Cancellation Note  Patient Details Name: Tamara Robertson MRN: [id number] DOB: 1929/07/21   Cancelled Treatment:    Reason Eval/Treat Not Completed: Medical issues which prohibited therapy(pt on ventilator and agitated, RN requested PT try back in the afternoon, will follow. )   Philomena Doheny 07/02/2017, 10:48 AM [phone]

## 2017-07-02 NOTE — Progress Notes (Signed)
PULMONARY / CRITICAL CARE MEDICINE   Name: Tamara Robertson MRN: [id number] DOB: 04/29/1930    ADMISSION DATE:  06/25/2017 CONSULTATION DATE:  06/27/17  REFERRING MD:  Dr. Benjamine Mola / [facility]   CHIEF COMPLAINT:  Respiratory distress   BRIEF SUMMARY:  [name] admitted 2/19 with reports of fever, cough, right lower back pain and concern for pneumonia. PMH CAD, ITP on chronic prednisone (2.5mg  QD).  Found to have strep pneumoniae PNA.  Progressed to respiratory failure, intubated 2/21.     SUBJECTIVE:   Confused, nonfocal, back on fentanyl VITAL SIGNS: BP (!) 149/44   Pulse 65   Temp 98.6 F (37 C) (Axillary)   Resp (!) 23   Ht 5\' 2"  (1.575 m)   Wt 156 lb 8.4 oz (71 kg)   SpO2 99%   BMI 28.63 kg/m   HEMODYNAMICS:    VENTILATOR SETTINGS: Vent Mode: PRVC FiO2 (%):  [35 %] 35 % Set Rate:  [18 bmp] 18 bmp Vt Set:  [400 mL] 400 mL PEEP:  [5 cmH20] 5 cmH20 Pressure Support:  [10 cmH20] 10 cmH20 Plateau Pressure:  [15 cmH20-19 cmH20] 18 cmH20 Filed Weights   06/30/17 0500 07/01/17 0400 07/02/17 0500  Weight: 165 lb 12.6 oz (75.2 kg) 166 lb 7.2 oz (75.5 kg) 156 lb 8.4 oz (71 kg)   INTAKE / OUTPUT:  Intake/Output Summary (Last 24 hours) at 07/02/2017 9629 Last data filed at 07/02/2017 0500 Gross per 24 hour  Intake 2748.65 ml  Output 6280 ml  Net -3531.35 ml     PHYSICAL EXAMINATION: General: This is a 84 year old female currently resting in bed.  She is encephalopathic, nonspecific, currently heavily sedated on fentanyl HEENT: Orally intubated no jugular venous distention mucous membranes are moist Pulmonary: Scattered rhonchi, equal chest rise, no accessory usecardiac: Regular rate and rhythm on telemetry Abdomen: Soft nontender no organomegaly, last BM: 2/25 Extremities/musculoskeletal: Warm/dry no significant edema Neuro/psych: Awake, not following commands, localizing some, will not focus. LABS:  BMET Recent Labs  Lab 06/29/17 0329 07/01/17 0922 07/02/17 0309  NA 146*  150* 146*  K 3.8 3.9 3.0*  CL 113* 114* 105  CO2 23 24 29   BUN 29* 40* 35*  CREATININE 0.56 0.58 0.59  GLUCOSE 98 154* 162*    Electrolytes Recent Labs  Lab 06/29/17 0329 07/01/17 0922 07/02/17 0309  CALCIUM 8.6* 8.9 8.3*  MG  --   --  2.0  PHOS  --   --  2.8    CBC Recent Labs  Lab 06/29/17 0329 07/01/17 0922 07/02/17 0309  WBC 23.6* 22.1* 23.8*  HGB 11.6* 11.3* 10.2*  HCT 33.9* 33.7* 30.2*  PLT 120* 298 361    Coag's Recent Labs  Lab 06/25/17 2006  INR 1.30    Sepsis Markers Recent Labs  Lab 06/26/17 0006 06/26/17 0216 06/27/17 0550 07/01/17 0922 07/02/17 0309  LATICACIDVEN 3.79* 3.56* 1.4  --   --   PROCALCITON  --   --   --  3.24 2.30    ABG Recent Labs  Lab 06/27/17 1728  PHART 7.340*  PCO2ART 42.7  PO2ART 322*    Liver Enzymes Recent Labs  Lab 06/25/17 1614 06/25/17 2006  AST 33 51*  ALT 21 25  ALKPHOS 56 57  BILITOT 1.0 0.8  ALBUMIN 3.7 3.6    Cardiac Enzymes No results for input(s): TROPONINI, PROBNP in the last 168 hours.  Glucose Recent Labs  Lab 06/30/17 2350 07/01/17 0327 07/01/17 0752 07/01/17 1147 07/01/17 2004 07/02/17  0309  GLUCAP 105* 135* 129* 177* 158* 141*    Imaging Ct Head Wo Contrast  Result Date: 07/01/2017 CLINICAL DATA:  Possible stroke. EXAM: CT HEAD WITHOUT CONTRAST TECHNIQUE: Contiguous axial images were obtained from the base of the skull through the vertex without intravenous contrast. COMPARISON:  CT scan of March 10, 2015. FINDINGS: Brain: Mild diffuse cortical atrophy is noted. No mass effect or midline shift is noted. Ventricular size is within normal limits. There is no evidence of mass lesion, hemorrhage or acute infarction. Vascular: No hyperdense vessel or unexpected calcification. Skull: Normal. Negative for fracture or focal lesion. Sinuses/Orbits: Mild right sphenoid sinusitis is noted. Other: None. IMPRESSION: Mild diffuse cortical atrophy. No acute intracranial abnormality seen.  Electronically Signed   By: Lupita Raider, M.D.   On: 07/01/2017 20:02   Dg Chest Port 1 View  Result Date: 07/01/2017 CLINICAL DATA:  Hypoxia EXAM: PORTABLE CHEST 1 VIEW COMPARISON:  June 29, 2017 FINDINGS: Endotracheal tube tip is 2.8 cm above the carina. Nasogastric tube tip and side port are below the diaphragm. No pneumothorax. There are pleural effusions bilaterally. There is patchy airspace opacification in the right upper and lower lobes, somewhat less than on recent study. There has also been partial clearing of patchy opacity from the left mid and upper lung zones. Heart size and pulmonary vascularity are normal. No adenopathy. There is no evident bone lesion. IMPRESSION: Tube positions as described without pneumothorax. Partial but incomplete clearing of patchy airspace opacity bilaterally. No new opacity. Stable cardiac silhouette. Electronically Signed   By: Bretta Bang III M.D.   On: 07/01/2017 08:36     STUDIES:  2/21 CXR >> progression of RLL infiltrate 2/22 ECHO >> systolic EF 60-65%. Wall motion nml, + diastolic dysfxn but not able to determine grade. Mode AS CT head 2/25: No acute findings CULTURES: BCx2 2/19 >> neg Sputum 2/19 >>  RVP 2/22 >>neg Urine strep +  ANTIBIOTICS: Rocephin 2/19 >>  Azithromycin 2/19 >> 2/20  SIGNIFICANT EVENTS: 2/19  Admit with cough, SOB, RLL PNA  2/21  Tx to ICU  LINES/TUBES:   DISCUSSION: [name] admitted with RLL Strep Pneumo PNA.  Developed progressive respiratory distress, transferred to ICU 2/21.   Transitioning focus to delirium therapy, hope to be extubated in the next 24-48 hours.  ASSESSMENT / PLAN:  Acute Hypoxic respiratory failure in setting of Pneumococcal PNA and ALI PCXR reviewed personally: Endotracheal tubes in satisfactory position.  There has been improved aeration when compared to prior film following aggressive diuresis. -Currently on high dosing of fentanyl, mental status continues to be major  barrier to weaning efforts Plan Once Again we will discontinue the fentanyl infusion and resume the Precedex drip RASS goal 0 Demadex x1 via tube, starting at 20 mg daily, will titrate up as indicated (has allergy to furosemide with rash has tolerated IV Lasix thus far will see if just changing to Demadex will be enough)  Out of bed daily Aggressive intervention for delirium including reorientation, day night protocol, adding melatonin at at bedtime Day #8 Rocephin, pro-calcitonin improved however not normalized, will continue Rocephin another 2 days I remain hopeful will be close to extubation in the next 24 hours assuming the fentanyl does not get resumed once again   Acute encephalopathy -->this has been major barrier to extubation and weaning Plan DC fentanyl drip Resume Precedex Increase Seroquel to 50 mg during day and 100 at at bedtime Taper steroids No more benzodiazepine Continue correction  of water imbalance Aggressive day night interventions (see above)   Hx CAD, HLD Murmur - no documented hx; echo showing mild AS Hypertriglyceridemia - on propofol -->improved after stopping  Hypertension Bradycardia, suspect this was actually more in part due to labetalol than Precedex Plan Continue telemetry monitoring Continuing diuresis aiming for negative fluid balance Discontinue labetalol,  Although with her aortic stenosis would like to avoid tachycardia likely Precedex will achieve that part Add scheduled hydralazine via tube  Fluid and electrolyte imbalance: Hypernatremia and hyperchloremia, and hypokalemia Plan Continue free water Potassium replacement Follow-up a.m. chemistry   Chronic Thrombocytopenia - followed by Dr. Clelia Croft as outpatient; Hx ITP - on 2.5 mg Prednisone at baseline  Plan Continue to taper prednisone back to 2.5 mg baseline dosing over the next 4 days   Hx GERD, Hepatic Steatosis, IBS  Plan Continue H2 blockade  Mild anemia w/out evidence of  bleeding Plan Trend CBC Transfuse per protocol   Mild Hyperglycemia  Plan Sliding scale insulin  Hip Pain Hx Bursitis  Plan PRN fentanyl at reduced dosing given age Lidocaine patch    DVT prophylaxis: scds SUP: H2B Diet: tubefeeds Activity: OOB Disposition : ICU   My critical care times 48 minutes this includes direct care as well as family updating at bedside.  Simonne Martinet ACNP-BC [facility] Pulmonary/Critical Care Pager # [phone] OR # [phone] if no answer

## 2017-07-02 NOTE — Evaluation (Signed)
Physical Therapy Evaluation Patient Details Name: Tamara Robertson MRN: [id number] DOB: [birth year] Today's Date: 07/02/2017   History of Present Illness  [name] admitted 2/19 with reports of fever, cough, right lower back pain and concern for pneumonia. PMH CAD, ITP on chronic prednisone (2.5mg  QD).  Found to have strep pneumoniae PNA.  Progressed to respiratory failure, intubated 2/21.    Clinical Impression  Pt admitted with above diagnosis. Pt currently with functional limitations due to the deficits listed below (see PT Problem List). +2 total assist for bed to recliner transfer. Pt lethargic and did not respond to commands. Min A for static sitting balance. At present, she is unable to participate in PT.  Pt will benefit from skilled PT to increase their independence and safety with mobility to allow discharge to the venue listed below.       Follow Up Recommendations SNF;Supervision/Assistance - 24 hour    Equipment Recommendations  Other (comment)(to be determined)    Recommendations for Other Services       Precautions / Restrictions Precautions Precautions: Other (comment) Precaution Comments: on vent Restrictions Weight Bearing Restrictions: No      Mobility  Bed Mobility Overal bed mobility: Needs Assistance Bed Mobility: Supine to Sit     Supine to sit: +2 for physical assistance;Total assist     General bed mobility comments: +2 total for supine to sit, no participation from pt, pt sat at edge of bed for ~4 minutes with supervision to min A for balance  Transfers Overall transfer level: Needs assistance Equipment used: None Transfers: Stand Pivot Transfers   Stand pivot transfers: +2 physical assistance;Total assist       General transfer comment: pivot from bed to recliner with +2 total assist  Ambulation/Gait                Stairs            Wheelchair Mobility    Modified Rankin (Stroke Patients Only)       Balance Overall balance  assessment: Needs assistance Sitting-balance support: Feet supported;Bilateral upper extremity supported Sitting balance-Leahy Scale: Poor Sitting balance - Comments: briefly able to maintain neutral in sitting, min A for frequent posterior and R lateral lean     Standing balance-Leahy Scale: Zero                               Pertinent Vitals/Pain Pain Assessment: Faces Faces Pain Scale: No hurt    Home Living                   Additional Comments: pt not responsive, unable to provide this info    Prior Function           Comments: unknown     Hand Dominance        Extremity/Trunk Assessment   Upper Extremity Assessment Upper Extremity Assessment: Difficult to assess due to impaired cognition    Lower Extremity Assessment Lower Extremity Assessment: Difficult to assess due to impaired cognition    Cervical / Trunk Assessment Cervical / Trunk Assessment: Kyphotic  Communication   Communication: Other (comment)(on vent, did not respond to commands, did not attempt to shake/nod head)  Cognition Arousal/Alertness: Lethargic Behavior During Therapy: Flat affect Overall Cognitive Status: No family/caregiver present to determine baseline cognitive functioning  General Comments: pt did not respond to commands to grip my hand nor to wiggle toes, pt's eyes partially open during session      General Comments      Exercises     Assessment/Plan    PT Assessment Patient needs continued PT services  PT Problem List Decreased activity tolerance;Decreased balance;Decreased mobility;Decreased cognition;Cardiopulmonary status limiting activity       PT Treatment Interventions Functional mobility training;Therapeutic activities;Therapeutic exercise;Balance training;Patient/family education    PT Goals (Current goals can be found in the Care Plan section)  Acute Rehab PT Goals PT Goal Formulation:  Patient unable to participate in goal setting Time For Goal Achievement: 07/16/17 Potential to Achieve Goals: Fair    Frequency Min 2X/week   Barriers to discharge        Co-evaluation               AM-PAC PT "6 Clicks" Daily Activity  Outcome Measure Difficulty turning over in bed (including adjusting bedclothes, sheets and blankets)?: Unable Difficulty moving from lying on back to sitting on the side of the bed? : Unable Difficulty sitting down on and standing up from a chair with arms (e.g., wheelchair, bedside commode, etc,.)?: Unable Help needed moving to and from a bed to chair (including a wheelchair)?: Total Help needed walking in hospital room?: Total Help needed climbing 3-5 steps with a railing? : Total 6 Click Score: 6    End of Session Equipment Utilized During Treatment: Gait belt;Oxygen Activity Tolerance: Patient tolerated treatment well Patient left: in chair;with call bell/phone within reach;with nursing/sitter in room Nurse Communication: Mobility status;Need for lift equipment PT Visit Diagnosis: Muscle weakness (generalized) (M62.81);Difficulty in walking, not elsewhere classified (R26.2)    Time: 8295-6213 PT Time Calculation (min) (ACUTE ONLY): 13 min   Charges:   PT Evaluation $PT Eval Moderate Complexity: 1 Mod     PT G Codes:          Tamala Ser 07/02/2017, 1:30 PM [phone]

## 2017-07-02 NOTE — Progress Notes (Signed)
[facility] Physician-Brief Progress Note Patient Name: Tamara Robertson DOB: 11/30/1929 MRN: [id number]   Date of Service  07/02/2017  HPI/Events of Note  Hypertension - BP =  196/53. HR = 49. Bradycardia secondary to Propofol IV infusion which was restarted by the rounding team today. Already on Hydralazine IV PRN.   eICU Interventions  Will order: 1. Nicardipine IV infusion. Titrate to SBP < 170.      Intervention Category Major Interventions: Hypertension - evaluation and management  Sommer,Steven Eugene 07/02/2017, 8:50 PM

## 2017-07-02 NOTE — Progress Notes (Signed)
Fentanyl drip D/C this AM. Wasted 60 ml in sink with  Earma Reading, RN. (84mcg/ml)

## 2017-07-02 NOTE — Progress Notes (Signed)
[facility] Physician-Brief Progress Note Patient Name: Tamara Robertson DOB: [birth year] MRN: [id number]   Date of Service  07/02/2017  HPI/Events of Note  Bradycardia - HR decreased to 47. Very likely d/t Precedex IV infusion.   eICU Interventions  Will order: 1. Fentanyl IV infusion. Titrate to RASS = 0.  2. Wean Precedex IV infusion off when Fentanyl IV infusion is running.      Intervention Category Major Interventions: Arrhythmia - evaluation and management  Remee Charley Eugene 07/02/2017, 4:15 AM

## 2017-07-02 NOTE — Progress Notes (Signed)
[facility] ADULT ICU REPLACEMENT PROTOCOL FOR AM LAB REPLACEMENT ONLY  The patient does apply for the [facility] Adult ICU Electrolyte Replacment Protocol based on the criteria listed below:   1. Is GFR >/= 40 ml/min? Yes.    Patient's GFR today is > 60 2. Is urine output >/= 0.5 ml/kg/hr for the last 6 hours? Yes.   Patient's UOP is 1.24 ml/kg/hr 3. Is BUN < 60 mg/dL? Yes.    Patient's BUN today is 35 4. Abnormal electrolyte(s): K+=3.0 5. Ordered repletion with: per protocol 6. If a panic level lab has been reported, has the CCM MD in charge been notified? Yes.  .   Physician: Oletta Darter, MD  Vilma Prader 07/02/2017 6:15 AM

## 2017-07-03 LAB — GLUCOSE, CAPILLARY
GLUCOSE-CAPILLARY: 136 mg/dL — AB (ref 65–99)
GLUCOSE-CAPILLARY: 138 mg/dL — AB (ref 65–99)
GLUCOSE-CAPILLARY: 113 mg/dL — AB (ref 65–99)
GLUCOSE-CAPILLARY: 82 mg/dL (ref 65–99)
Glucose-Capillary: 137 mg/dL — ABNORMAL HIGH (ref 65–99)
Glucose-Capillary: 160 mg/dL — ABNORMAL HIGH (ref 65–99)
Glucose-Capillary: 151 mg/dL — ABNORMAL HIGH (ref 65–99)
Glucose-Capillary: 146 mg/dL — ABNORMAL HIGH (ref 65–99)
Glucose-Capillary: 122 mg/dL — ABNORMAL HIGH (ref 65–99)

## 2017-07-03 LAB — BASIC METABOLIC PANEL
Anion gap: 12 (ref 5–15)
BUN: 41 mg/dL — ABNORMAL HIGH (ref 6–20)
CO2: 32 mmol/L (ref 22–32)
Calcium: 8.6 mg/dL — ABNORMAL LOW (ref 8.9–10.3)
Chloride: 103 mmol/L (ref 101–111)
Creatinine, Ser: 0.47 mg/dL (ref 0.44–1.00)
GFR calc Af Amer: >60 mL/min (ref >60)
GLUCOSE: 147 mg/dL — AB (ref 65–99)
POTASSIUM: 3.6 mmol/L (ref 3.5–5.1)
SODIUM: 147 mmol/L — AB (ref 135–145)

## 2017-07-03 LAB — CBC
HCT: 31.4 % — ABNORMAL LOW (ref 36.0–46.0)
HEMOGLOBIN: 10.5 g/dL — AB (ref 12.0–15.0)
MCH: 28.4 pg (ref 26.0–34.0)
MCHC: 33.4 g/dL (ref 30.0–36.0)
MCV: 84.9 fL (ref 78.0–100.0)
Platelets: 402 10*3/uL — ABNORMAL HIGH (ref 150–400)
RBC: 3.7 MIL/uL — ABNORMAL LOW (ref 3.87–5.11)
RDW: 15.8 % — ABNORMAL HIGH (ref 11.5–15.5)
WBC: 23.5 10*3/uL — ABNORMAL HIGH (ref 4.0–10.5)

## 2017-07-03 LAB — MAGNESIUM: Magnesium: 2 mg/dL (ref 1.7–2.4)

## 2017-07-03 LAB — PROCALCITONIN: PROCALCITONIN: 1.51 ng/mL

## 2017-07-03 LAB — PHOSPHORUS: PHOSPHORUS: 3.1 mg/dL (ref 2.5–4.6)

## 2017-07-03 MED ORDER — PREDNISONE 5 MG/5ML PO SOLN
10.0000 mg | Freq: Every day | ORAL | Status: DC
  Administered 2017-07-04: 10 mg
  Filled 2017-07-03: qty 10

## 2017-07-03 MED ORDER — NYSTATIN 100000 UNIT/GM EX POWD
Freq: Three times a day (TID) | CUTANEOUS | Status: DC
  Administered 2017-07-03 – 2017-07-08 (×13): via TOPICAL
  Administered 2017-07-08: 1 g via TOPICAL
  Administered 2017-07-09: 11:00:00 via TOPICAL
  Filled 2017-07-03 (×2): qty 15

## 2017-07-03 MED ORDER — POTASSIUM CHLORIDE 10 MEQ/100ML IV SOLN
10.0000 meq | INTRAVENOUS | Status: AC
  Administered 2017-07-03 (×4): 10 meq via INTRAVENOUS
  Filled 2017-07-03 (×4): qty 100

## 2017-07-03 MED ORDER — ACETAMINOPHEN 160 MG/5ML PO SOLN
1000.0000 mg | Freq: Four times a day (QID) | ORAL | Status: DC | PRN
  Administered 2017-07-03 – 2017-07-07 (×3): 1000 mg via ORAL
  Filled 2017-07-03 (×3): qty 40.6

## 2017-07-03 MED ORDER — RANITIDINE HCL 150 MG/10ML PO SYRP
150.0000 mg | ORAL_SOLUTION | Freq: Every day | ORAL | Status: DC
  Filled 2017-07-03: qty 10

## 2017-07-03 MED ORDER — QUETIAPINE FUMARATE 50 MG PO TABS: 50.0000 mg | ORAL_TABLET | Freq: Every day | ORAL | Status: DC

## 2017-07-03 MED ORDER — FAMOTIDINE 20 MG PO TABS
20.0000 mg | ORAL_TABLET | Freq: Every day | ORAL | Status: DC
  Administered 2017-07-03 – 2017-07-04 (×2): 20 mg via ORAL
  Filled 2017-07-03 (×2): qty 1

## 2017-07-03 MED ORDER — ORAL CARE MOUTH RINSE
15.0000 mL | Freq: Two times a day (BID) | OROMUCOSAL | Status: DC
  Administered 2017-07-03 – 2017-07-09 (×11): 15 mL via OROMUCOSAL

## 2017-07-03 MED ORDER — HYDRALAZINE HCL 20 MG/ML IJ SOLN
5.0000 mg | INTRAMUSCULAR | Status: DC | PRN
  Administered 2017-07-03 – 2017-07-04 (×3): 10 mg via INTRAVENOUS
  Filled 2017-07-03 (×3): qty 1

## 2017-07-03 MED ORDER — ACETAMINOPHEN 160 MG/5ML PO SOLN
650.0000 mg | Freq: Four times a day (QID) | ORAL | Status: DC | PRN
  Administered 2017-07-03: 650 mg via ORAL
  Filled 2017-07-03: qty 20.3

## 2017-07-03 NOTE — Procedures (Signed)
Extubation Procedure Note  Patient Details:   Name: Tamara Robertson DOB: 06/14/1929 MRN: [id number]   Airway Documentation:     Evaluation  O2 sats: 146 Complications: none Patient tolerated procedure well. Bilateral Breath Sounds: Clear, Diminished   Pt able to speak  Per CCM order, pt extubated.  Tolerated well with no complications.  Martha Clan 07/03/2017, 9:21 AM

## 2017-07-03 NOTE — Progress Notes (Signed)
PULMONARY / CRITICAL CARE MEDICINE   Name: Tamara Robertson MRN: [id number] DOB: 1929-05-14    ADMISSION DATE:  06/25/2017 CONSULTATION DATE:  06/27/17  REFERRING MD:  Dr. Benjamine Mola / [facility]   CHIEF COMPLAINT:  Respiratory distress   BRIEF SUMMARY:  [name] admitted 2/19 with reports of fever, cough, right lower back pain and concern for pneumonia. PMH CAD, ITP on chronic prednisone (2.5mg  QD).  Found to have strep pneumoniae PNA.  Progressed to respiratory failure, intubated 2/21.     SUBJECTIVE:   Looks remarkably better today ready for extubation VITAL SIGNS: BP (!) 155/46 (BP Location: Right Arm)   Pulse (!) 54   Temp (!) 94.2 F (34.6 C) (Rectal) Comment: bear hugger applied  Resp 19   Ht 5\' 2"  (1.575 m)   Wt 154 lb 15.7 oz (70.3 kg)   SpO2 100%   BMI 28.35 kg/m   HEMODYNAMICS:    VENTILATOR SETTINGS: Vent Mode: PSV;CPAP FiO2 (%):  [30 %-35 %] 30 % Set Rate:  [18 bmp] 18 bmp Vt Set:  [400 mL] 400 mL PEEP:  [5 cmH20] 5 cmH20 Pressure Support:  [5 cmH20-10 cmH20] 5 cmH20 Plateau Pressure:  [12 cmH20-18 cmH20] 18 cmH20 Filed Weights   07/01/17 0400 07/02/17 0500 07/03/17 0400  Weight: 166 lb 7.2 oz (75.5 kg) 156 lb 8.4 oz (71 kg) 154 lb 15.7 oz (70.3 kg)   INTAKE / OUTPUT:  Intake/Output Summary (Last 24 hours) at 07/03/2017 5784 Last data filed at 07/03/2017 0800 Gross per 24 hour  Intake 3432.89 ml  Output 4150 ml  Net -717.11 ml     PHYSICAL EXAMINATION: General: This is a 84 year old white female currently resting on pressure support ventilation. HEENT: Some periorbital edema, extremities voice, orally intubated, no JVD. Pulmonary: Scattered rhonchi, equal chest rise, no accessory use on pressure support ventilation Cardiac: Regular rate and rhythm Abdomen: Soft nontender  extremities/muscular skeleton: Warm dry scattered areas of ecchymosis Neuro/psych: Much more awake and appropriate, follow commands, interactive. LABS:  BMET Recent Labs  Lab 07/01/17 0922  07/02/17 0309 07/03/17 0255  NA 150* 146* 147*  K 3.9 3.0* 3.6  CL 114* 105 103  CO2 24 29 32  BUN 40* 35* 41*  CREATININE 0.58 0.59 0.47  GLUCOSE 154* 162* 147*    Electrolytes Recent Labs  Lab 07/01/17 0922 07/02/17 0309 07/03/17 0255  CALCIUM 8.9 8.3* 8.6*  MG  --  2.0 2.0  PHOS  --  2.8 3.1    CBC Recent Labs  Lab 07/01/17 0922 07/02/17 0309 07/03/17 0255  WBC 22.1* 23.8* 23.5*  HGB 11.3* 10.2* 10.5*  HCT 33.7* 30.2* 31.4*  PLT 298 361 402*    Coag's No results for input(s): APTT, INR in the last 168 hours.  Sepsis Markers Recent Labs  Lab 06/27/17 0550 07/01/17 0922 07/02/17 0309 07/03/17 0255  LATICACIDVEN 1.4  --   --   --   PROCALCITON  --  3.24 2.30 1.51    ABG Recent Labs  Lab 06/27/17 1728  PHART 7.340*  PCO2ART 42.7  PO2ART 322*    Liver Enzymes No results for input(s): AST, ALT, ALKPHOS, BILITOT, ALBUMIN in the last 168 hours.  Cardiac Enzymes No results for input(s): TROPONINI, PROBNP in the last 168 hours.  Glucose Recent Labs  Lab 07/02/17 1127 07/02/17 1556 07/02/17 1935 07/02/17 2314 07/03/17 0317 07/03/17 0748  GLUCAP 165* 145* 160* 147* 138* 151*    Imaging Dg Chest Port 1 View  Result Date:  07/02/2017 CLINICAL DATA:  Respiratory failure. EXAM: PORTABLE CHEST 1 VIEW COMPARISON:  Chest x-ray from yesterday. FINDINGS: Unchanged positioning of the endotracheal and enteric tubes. Stable cardiomediastinal silhouette. Unchanged layering right greater than left pleural effusions with bibasilar airspace disease. Airspace disease in the right upper lobe appears improved. No pneumothorax. No acute osseous abnormality. IMPRESSION: 1. Improved airspace disease in the right upper lobe. Unchanged layering right greater than left pleural effusions with bibasilar consolidation. Electronically Signed   By: Obie Dredge M.D.   On: 07/02/2017 14:53     STUDIES:  2/21 CXR >> progression of RLL infiltrate 2/22 ECHO >> systolic EF  60-65%. Wall motion nml, + diastolic dysfxn but not able to determine grade. Mode AS CT head 2/25: No acute findings CULTURES: BCx2 2/19 >> neg Sputum 2/19 >>  RVP 2/22 >>neg Urine strep +  ANTIBIOTICS: Rocephin 2/19 >>  Azithromycin 2/19 >> 2/20  SIGNIFICANT EVENTS: 2/19  Admit with cough, SOB, RLL PNA  2/21  Tx to ICU  LINES/TUBES:  Extubated 2/27  DISCUSSION: [name] admitted with RLL Strep Pneumo PNA.  Developed progressive respiratory distress, transferred to ICU 2/21.   She looks much better.  I credit the excellent nursing interventions Continue to mobilize, weaning Precedex, start to taper additional sedating medications.  Will likely need another 2 more days of Rocephin.  I would like to see procalcitonin normalized.  Finally we will repeat a chest x-ray in the a.m., she did have what looked to be some degree of pleural effusion, this may need to be sampled   ASSESSMENT / PLAN:  Acute Hypoxic respiratory failure in setting of Pneumococcal PNA and ALI Chest x-ray which was personally reviewed on 2/26 had demonstrated remarkable improvement Major barrier was encephalopathy this is remarkably improved she is passed her spontaneous breathing trial and is now ready for extubation Plan Extubate to nasal cannula Wean oxygen as needed N.p.o. for now Day #9 Rocephin, her pro-calcitonin is trending down, we will use of the pro calcitonin normalizing to determine stop date of Rocephin Out of bed Repeat chest x-ray a.m.  Acute encephalopathy -->this has been major barrier to extubation and weaning -She is remarkably improved.  She is now interactive, following commands, more appropriate.  No longer appears to be hallucinating. Plan Wean Precedex to off Change Seroquel to 50 mg at at bedtime Continue melatonin at at bedtime Continue to taper steroids Absolutely no benzodiazepines Careful with narcotics Out of bed Frequent reorientation   Hx CAD, HLD Murmur - no  documented hx; echo showing mild AS Hypertriglyceridemia - on propofol -->improved after stopping  Hypertension Bradycardia, suspect this was actually more in part due to labetalol than Precedex Plan Continue telemetry monitoring Holding diuresis Scheduled hydralazine on hold due to n.p.o. status, will continue nicardipine for now to keep systolic blood pressure less than 140  Fluid and electrolyte imbalance: Hypernatremia and hyperchloremia, and hypokalemia; sodium a little worse  Plan Continue free water replacement, will change this to IV using D5 water given n.p.o. status Hold torsemide today Repeat a.m. chemistry   Chronic Thrombocytopenia - followed by Dr. Clelia Croft as outpatient; Hx ITP - on 2.5 mg Prednisone at baseline  Plan Continue to taper prednisone back to 2.5 mg baseline dosing over the next 3 days   Hx GERD, Hepatic Steatosis, IBS  Plan Continue H2 blockade  Mild anemia w/out evidence of bleeding Plan Transfuse per protocol Trend CBC  Mild Hyperglycemia  Plan Sliding scale insulin  Hip Pain Hx  Bursitis  Plan Decrease fentanyl further Continue lidocaine patch   DVT prophylaxis: scds SUP: H2B Diet: tubefeeds Activity: OOB Disposition : ICU   My critical care time is 33 minutes  Simonne Martinet ACNP-BC [facility] Pulmonary/Critical Care Pager # [phone] OR # [phone] if no answer

## 2017-07-03 NOTE — Care Management Note (Signed)
Case Management Note  Patient Details  Name: Tamara Robertson MRN: [id number] Date of Birth: 02/28/1930  Subjective/Objective:Extubated. PT recc SNF/24hr supv. CSW following.                    Action/Plan:d/c plan SNF.   Expected Discharge Date:  (unknown)               Expected Discharge Plan:  Skilled Nursing Facility  In-House Referral:  Clinical Social Work  Discharge planning Services  CM Consult  Post Acute Care Choice:    Choice offered to:     DME Arranged:    DME Agency:     HH Arranged:    [place] Agency:     Status of Service:  In process, will continue to follow  If discussed at Long Length of Stay Meetings, dates discussed:    Additional Comments:  Dessa Phi, RN 07/03/2017, 10:43 AM

## 2017-07-04 ENCOUNTER — Inpatient Hospital Stay (HOSPITAL_COMMUNITY): Payer: Medicare Other

## 2017-07-04 DIAGNOSIS — J90 Pleural effusion, not elsewhere classified: Secondary | ICD-10-CM

## 2017-07-04 LAB — PROCALCITONIN: Procalcitonin: 1.1 ng/mL

## 2017-07-04 LAB — GLUCOSE, CAPILLARY
GLUCOSE-CAPILLARY: 94 mg/dL (ref 65–99)
GLUCOSE-CAPILLARY: 91 mg/dL (ref 65–99)
GLUCOSE-CAPILLARY: 116 mg/dL — AB (ref 65–99)
GLUCOSE-CAPILLARY: 86 mg/dL (ref 65–99)
Glucose-Capillary: 121 mg/dL — ABNORMAL HIGH (ref 65–99)
Glucose-Capillary: 98 mg/dL (ref 65–99)

## 2017-07-04 LAB — BODY FLUID CELL COUNT WITH DIFFERENTIAL
Lymphs, Fluid: 20 %
Monocyte-Macrophage-Serous Fluid: 11 % — ABNORMAL LOW (ref 50–90)
Neutrophil Count, Fluid: 69 % — ABNORMAL HIGH (ref 0–25)
WBC FLUID: 1032 uL — AB (ref 0–1000)

## 2017-07-04 LAB — CBC
HCT: 35.8 % — ABNORMAL LOW (ref 36.0–46.0)
HEMOGLOBIN: 11.8 g/dL — AB (ref 12.0–15.0)
MCH: 27.5 pg (ref 26.0–34.0)
MCHC: 33 g/dL (ref 30.0–36.0)
MCV: 83.4 fL (ref 78.0–100.0)
PLATELETS: 487 10*3/uL — AB (ref 150–400)
RBC: 4.29 MIL/uL (ref 3.87–5.11)
RDW: 15.2 % (ref 11.5–15.5)
WBC: 24.4 10*3/uL — AB (ref 4.0–10.5)

## 2017-07-04 LAB — CHOLESTEROL, TOTAL: Cholesterol: 196 mg/dL (ref 0–200)

## 2017-07-04 LAB — BASIC METABOLIC PANEL
ANION GAP: 9 (ref 5–15)
BUN: 28 mg/dL — ABNORMAL HIGH (ref 6–20)
CHLORIDE: 96 mmol/L — AB (ref 101–111)
CO2: 32 mmol/L (ref 22–32)
CREATININE: 0.57 mg/dL (ref 0.44–1.00)
Calcium: 8.4 mg/dL — ABNORMAL LOW (ref 8.9–10.3)
GFR calc non Af Amer: >60 mL/min (ref >60)
Glucose, Bld: 98 mg/dL (ref 65–99)
Potassium: 4.2 mmol/L (ref 3.5–5.1)
SODIUM: 137 mmol/L (ref 135–145)

## 2017-07-04 LAB — LACTATE DEHYDROGENASE, PLEURAL OR PERITONEAL FLUID: LD, Fluid: 105 U/L — ABNORMAL HIGH (ref 3–23)

## 2017-07-04 LAB — LACTATE DEHYDROGENASE: LDH: 167 U/L (ref 98–192)

## 2017-07-04 LAB — MAGNESIUM: Magnesium: 1.9 mg/dL (ref 1.7–2.4)

## 2017-07-04 LAB — PHOSPHORUS: Phosphorus: 3.8 mg/dL (ref 2.5–4.6)

## 2017-07-04 LAB — PROTEIN, PLEURAL OR PERITONEAL FLUID: Total protein, fluid: <3 g/dL

## 2017-07-04 MED ORDER — RANITIDINE HCL 150 MG/10ML PO SYRP
150.0000 mg | ORAL_SOLUTION | Freq: Every day | ORAL | Status: DC
  Administered 2017-07-06: 150 mg via ORAL
  Filled 2017-07-04 (×3): qty 10

## 2017-07-04 MED ORDER — PREDNISONE 5 MG/5ML PO SOLN: 2.5000 mg | Freq: Every day | ORAL | Status: DC

## 2017-07-04 MED ORDER — IPRATROPIUM-ALBUTEROL 0.5-2.5 (3) MG/3ML IN SOLN
3.0000 mL | Freq: Three times a day (TID) | RESPIRATORY_TRACT | Status: DC
  Administered 2017-07-05 (×2): 3 mL via RESPIRATORY_TRACT
  Filled 2017-07-04 (×2): qty 3

## 2017-07-04 MED ORDER — METOPROLOL TARTRATE 25 MG PO TABS
25.0000 mg | ORAL_TABLET | Freq: Two times a day (BID) | ORAL | Status: DC
  Administered 2017-07-04: 25 mg via ORAL
  Filled 2017-07-04: qty 1

## 2017-07-04 MED ORDER — PREDNISONE 5 MG/5ML PO SOLN
2.5000 mg | Freq: Every day | ORAL | Status: DC
  Administered 2017-07-05 – 2017-07-06 (×2): 2.5 mg via ORAL
  Filled 2017-07-04 (×3): qty 2.5

## 2017-07-04 MED ORDER — METOPROLOL TARTRATE 25 MG/10 ML ORAL SUSPENSION
25.0000 mg | Freq: Two times a day (BID) | ORAL | Status: DC
  Administered 2017-07-04 – 2017-07-06 (×5): 25 mg via ORAL
  Filled 2017-07-04 (×6): qty 10

## 2017-07-04 MED ORDER — LIDOCAINE 5 % EX PTCH
2.0000 | MEDICATED_PATCH | CUTANEOUS | Status: DC
  Administered 2017-07-05 – 2017-07-09 (×5): 2 via TRANSDERMAL
  Filled 2017-07-04 (×5): qty 2

## 2017-07-04 MED ORDER — MELATONIN 5 MG PO TABS
5.0000 mg | ORAL_TABLET | Freq: Every day | ORAL | Status: DC
  Administered 2017-07-04: 5 mg via ORAL
  Filled 2017-07-04 (×5): qty 1

## 2017-07-04 MED ORDER — PREDNISONE 5 MG PO TABS: 2.5000 mg | ORAL_TABLET | Freq: Every day | ORAL | Status: DC

## 2017-07-04 MED ORDER — ADULT MULTIVITAMIN W/MINERALS CH: 1.0000 | ORAL_TABLET | Freq: Every day | ORAL | Status: DC

## 2017-07-04 MED ORDER — MELOXICAM 7.5 MG PO TABS
7.5000 mg | ORAL_TABLET | Freq: Every day | ORAL | Status: AC
  Administered 2017-07-04 – 2017-07-05 (×2): 7.5 mg via ORAL
  Filled 2017-07-04 (×2): qty 1

## 2017-07-04 MED ORDER — IBUPROFEN 100 MG/5ML PO SUSP
600.0000 mg | Freq: Three times a day (TID) | ORAL | Status: DC | PRN
Start: 2017-07-04 — End: 2017-07-06
  Filled 2017-07-04: qty 30

## 2017-07-04 MED ORDER — ADULT MULTIVITAMIN LIQUID CH
15.0000 mL | Freq: Every day | ORAL | Status: DC
  Filled 2017-07-04 (×3): qty 15

## 2017-07-04 MED ORDER — HYDRALAZINE HCL 50 MG PO TABS
50.0000 mg | ORAL_TABLET | Freq: Three times a day (TID) | ORAL | Status: DC
  Administered 2017-07-04 – 2017-07-09 (×14): 50 mg via ORAL
  Filled 2017-07-04 (×18): qty 1

## 2017-07-04 NOTE — Progress Notes (Signed)
PROGRESS NOTE    Tamara Robertson  ZOX:[id number] DOB: Oct 28, 1929 DOA: 06/25/2017 PCP: Pincus Sanes, MD    Brief Narrative: [name] admitted 2/19 with reports of fever, cough, right lower back pain and concern for pneumonia. PMH CAD, ITP on chronic prednisone (2.5mg  QD).  Found to have strep pneumoniae PNA.  Progressed to respiratory failure, intubated 2/21 and extubated on 2/27.  Patient was transferred to try her service on 2/28.    Assessment & Plan:   Principal Problem:   Community acquired pneumonia of right lower lobe of lung (HCC) Active Problems:   Severe sepsis with acute organ dysfunction (HCC)   AKI (acute kidney injury) (HCC)   Current chronic use of systemic steroids   Streptococcal pneumonia (HCC)   Acute respiratory failure with hypoxia (HCC)  Acute respiratory failure secondary to right lower lobe pneumonia Breathing continues to improve repeat chest x-ray showed some right sided persistent airspace disease.  Complete the course of antibiotic with Rocephin.  Plan for ultrasound of the right chest to evaluate for right pleural effusion possibly parapneumonic effusion.  Patient was intubated on the 21st and extubated on the 27th, and pulmonology following the patient.  Persistent but improving right base infiltrate. Interim near complete resolution of left base infiltrate.   Acute encephalopathy probably secondary to narcotic medication.  Currently she is alert awake and oriented to place and person, following commands.  Recommend PT evaluation.   History of chronic thrombocytopenia follows up with Dr. Clelia Croft as an outpatient has a history of ITP from aspirin use. Platelet counts are in 400s at this time. Patient on prednisone 2 0.5 mg daily   Mild normocytic anemia without any evidence of bleeding continue to monitor.  Patient reports right hip pain with history of bursitis.  Continue with Lidoderm patch, nonnarcotic pain medication as  needed.   Hypertension Well controlled blood pressure parameters.    Hypernatremia, Hypokalemia  In part due to decreased p.o. intake and free water deficit.  Start oral fluid hydration.  Holding torsemide.  Repeat BMP shows normal sodium today. Replace potassium as needed.   Leukocytosis partly secondary to community-acquired pneumonia and partly due to prednisone.  Pro-calcitonin appears to be improving.  DVT prophylaxis: scd's Code Status:  Full code.  Family Communication: none at bedside.  Disposition Plan: pending resolution of the respiratory failure.     STUDIES:  2/21 CXR >> progression of RLL infiltrate 2/22 ECHO >> systolic EF 60-65%. Wall motion nml, + diastolic dysfxn but not able to determine grade. Mode AS CT head 2/25: No acute findings   CULTURES: BCx2 2/19 >> neg Sputum 2/19 >>  RVP 2/22 >>neg Urine strep +    ANTIBIOTICS: Rocephin 2/19 >>  Azithromycin 2/19 >> 2/20   SIGNIFICANT EVENTS: 2/19  Admit with cough, SOB, RLL PNA  2/21  Tx to ICU   LINES/TUBES: Extubated 2/27    Subjective: Reports of left hip.   Objective: Vitals:   07/04/17 0402 07/04/17 0500 07/04/17 0600 07/04/17 0756  BP:  (!) 161/49 (!) 166/52   Pulse:      Resp:  (!) 27 (!) 27   Temp: 97.6 F (36.4 C)     TempSrc: Axillary     SpO2:  97% 98% 98%  Weight:  70 kg (154 lb 5.2 oz)    Height:        Intake/Output Summary (Last 24 hours) at 07/04/2017 0809 Last data filed at 07/04/2017 0600 Gross per 24 hour  Intake 1132.5 ml  Output 3750 ml  Net -2617.5 ml   Filed Weights   07/02/17 0500 07/03/17 0400 07/04/17 0500  Weight: 71 kg (156 lb 8.4 oz) 70.3 kg (154 lb 15.7 oz) 70 kg (154 lb 5.2 oz)    Examination:  General exam: Appears calm and comfortable on 2 L nasal cannula oxygen Respiratory system: Clear to auscultation. Respiratory effort normal. Cardiovascular system: S1 & S2 heard, RRR. No JVD, murmurs,No pedal edema. Gastrointestinal system: Abdomen  is nondistended, soft and nontender. No organomegaly or masses felt. Normal bowel sounds heard. Central nervous system: Alert and oriented. No focal neurological deficits. Extremities: Symmetric 5 x 5 power. Skin: No rashes, lesions or ulcers Psychiatry:  Mood & affect appropriate.     Data Reviewed: I have personally reviewed following labs and imaging studies  CBC: Recent Labs  Lab 06/29/17 0329 07/01/17 0922 07/02/17 0309 07/03/17 0255 07/04/17 0252  WBC 23.6* 22.1* 23.8* 23.5* 24.4*  HGB 11.6* 11.3* 10.2* 10.5* 11.8*  HCT 33.9* 33.7* 30.2* 31.4* 35.8*  MCV 83.3 84.5 84.1 84.9 83.4  PLT 120* 298 361 402* 487*   Basic Metabolic Panel: Recent Labs  Lab 06/29/17 0329 07/01/17 0922 07/02/17 0309 07/03/17 0255 07/04/17 0252  NA 146* 150* 146* 147* 137  K 3.8 3.9 3.0* 3.6 4.2  CL 113* 114* 105 103 96*  CO2 23 24 29  32 32  GLUCOSE 98 154* 162* 147* 98  BUN 29* 40* 35* 41* 28*  CREATININE 0.56 0.58 0.59 0.47 0.57  CALCIUM 8.6* 8.9 8.3* 8.6* 8.4*  MG  --   --  2.0 2.0 1.9  PHOS  --   --  2.8 3.1 3.8   GFR: Estimated Creatinine Clearance: 47.2 mL/min (by C-G formula based on SCr of 0.57 mg/dL). Liver Function Tests: No results for input(s): AST, ALT, ALKPHOS, BILITOT, PROT, ALBUMIN in the last 168 hours. No results for input(s): LIPASE, AMYLASE in the last 168 hours. No results for input(s): AMMONIA in the last 168 hours. Coagulation Profile: No results for input(s): INR, PROTIME in the last 168 hours. Cardiac Enzymes: No results for input(s): CKTOTAL, CKMB, CKMBINDEX, TROPONINI in the last 168 hours. BNP (last 3 results) No results for input(s): PROBNP in the last 8760 hours. HbA1C: No results for input(s): HGBA1C in the last 72 hours. CBG: Recent Labs  Lab 07/03/17 1540 07/03/17 1944 07/03/17 2323 07/04/17 0326 07/04/17 0757  GLUCAP 122* 113* 82 94 91   Lipid Profile: No results for input(s): CHOL, HDL, LDLCALC, TRIG, CHOLHDL, LDLDIRECT in the last 72  hours. Thyroid Function Tests: No results for input(s): TSH, T4TOTAL, FREET4, T3FREE, THYROIDAB in the last 72 hours. Anemia Panel: No results for input(s): VITAMINB12, FOLATE, FERRITIN, TIBC, IRON, RETICCTPCT in the last 72 hours. Sepsis Labs: Recent Labs  Lab 07/01/17 4098 07/02/17 0309 07/03/17 0255 07/04/17 0252  PROCALCITON 3.24 2.30 1.51 1.10    Recent Results (from the past 240 hour(s))  Culture, blood (Routine x 2)     Status: None   Collection Time: 06/25/17  6:53 PM  Result Value Ref Range Status   Specimen Description   Final    BLOOD RIGHT ANTECUBITAL Performed at [facility], [address], [place], [place] [address]    Special Requests   Final    BOTTLES DRAWN AEROBIC AND ANAEROBIC Blood Culture adequate volume   Culture   Final    NO GROWTH 5 DAYS Performed at [facility], [address] [place],  [place] [address]    Report Status 06/30/2017 FINAL  Final  Culture, blood (Routine x 2)     Status: None   Collection Time: 06/25/17  8:07 PM  Result Value Ref Range Status   Specimen Description   Final    BLOOD LEFT HAND Performed at [facility], [address], [place], [place] [address]    Special Requests   Final    BOTTLES DRAWN AEROBIC AND ANAEROBIC Blood Culture adequate volume Performed at [facility], [address] [place], [place] [address]    Culture   Final    NO GROWTH 5 DAYS Performed at [facility], [address] [place], [place] [address]    Report Status 06/30/2017 FINAL  Final  Respiratory Panel by PCR     Status: None   Collection Time: 06/27/17  2:26 PM  Result Value Ref Range Status   Adenovirus NOT DETECTED NOT DETECTED Final   Coronavirus 229E NOT DETECTED NOT DETECTED Final   Coronavirus HKU1 NOT DETECTED NOT DETECTED Final   Coronavirus NL63 NOT DETECTED NOT DETECTED Final   Coronavirus OC43 NOT DETECTED NOT DETECTED Final   Metapneumovirus NOT  DETECTED NOT DETECTED Final   Rhinovirus / Enterovirus NOT DETECTED NOT DETECTED Final   Influenza A NOT DETECTED NOT DETECTED Final   Influenza B NOT DETECTED NOT DETECTED Final   Parainfluenza Virus 1 NOT DETECTED NOT DETECTED Final   Parainfluenza Virus 2 NOT DETECTED NOT DETECTED Final   Parainfluenza Virus 3 NOT DETECTED NOT DETECTED Final   Parainfluenza Virus 4 NOT DETECTED NOT DETECTED Final   Respiratory Syncytial Virus NOT DETECTED NOT DETECTED Final   Bordetella pertussis NOT DETECTED NOT DETECTED Final   Chlamydophila pneumoniae NOT DETECTED NOT DETECTED Final   Mycoplasma pneumoniae NOT DETECTED NOT DETECTED Final    Comment: Performed at [facility], [address] [place], [place] [address]  MRSA PCR Screening     Status: None   Collection Time: 06/27/17  2:26 PM  Result Value Ref Range Status   MRSA by PCR NEGATIVE NEGATIVE Final    Comment:        The GeneXpert MRSA Assay (FDA approved for NASAL specimens only), is one component of a comprehensive MRSA colonization surveillance program. It is not intended to diagnose MRSA infection nor to guide or monitor treatment for MRSA infections. Performed at [facility], [address] [place], [place] [address]          Radiology Studies: Dg Chest Port 1 View  Result Date: 07/04/2017 CLINICAL DATA:  Pneumonia. EXAM: PORTABLE CHEST 1 VIEW COMPARISON:  07/02/2017. FINDINGS: Patient rotated to the right. Interim extubation and removal of NG tube. Persistent but improving right base infiltrate. Near complete clearing of left base infiltrate. Small residual right pleural effusion. No left pleural effusion noted on today's exam. No pneumothorax. IMPRESSION: 1.  Interim extubation and removal of NG tube. 2. Persistent but improving right base infiltrate. Interim near complete resolution of left base infiltrate. Small residual right pleural effusion. No left pleural effusion noted on today's exam.  Electronically Signed   By: Maisie Fus  Register   On: 07/04/2017 07:14   Dg Chest Port 1 View  Result Date: 07/02/2017 CLINICAL DATA:  Respiratory failure. EXAM: PORTABLE CHEST 1 VIEW COMPARISON:  Chest x-ray from yesterday. FINDINGS: Unchanged positioning of the endotracheal and enteric tubes. Stable cardiomediastinal silhouette. Unchanged layering right greater than left pleural effusions with bibasilar airspace  disease. Airspace disease in the right upper lobe appears improved. No pneumothorax. No acute osseous abnormality. IMPRESSION: 1. Improved airspace disease in the right upper lobe. Unchanged layering right greater than left pleural effusions with bibasilar consolidation. Electronically Signed   By: Obie Dredge M.D.   On: 07/02/2017 14:53        Scheduled Meds: . artificial tears   Both Eyes Q8H  . famotidine  20 mg Oral Daily  . insulin aspart  0-15 Units Subcutaneous Q4H  . ipratropium-albuterol  3 mL Nebulization Q6H  . latanoprost  1 drop Both Eyes QHS  . lidocaine  1 patch Transdermal Q24H  . mouth rinse  15 mL Mouth Rinse BID  . Melatonin  5 mg Per Tube QHS  . meloxicam  7.5 mg Oral Daily  . multivitamin  15 mL Per Tube Daily  . nystatin   Topical TID  . predniSONE  10 mg Per Tube Q breakfast  . QUEtiapine  50 mg Oral QHS   Continuous Infusions: . cefTRIAXone (ROCEPHIN)  IV Stopped (07/03/17 2254)  . dextrose 35 mL/hr at 07/04/17 0600  . niCARDipine Stopped (07/03/17 0310)     LOS: 9 days    Time spent: 35 minutes    Kathlen Mody, MD [facility] Pager [phone] If 7PM-7AM, please contact night-coverage [email or web address] Password [facility] 07/04/2017, 8:09 AM

## 2017-07-04 NOTE — Progress Notes (Signed)
Physical Therapy Treatment Patient Details Name: Tamara Robertson MRN: [id number] DOB: 07-09-1929 Today's Date: 07/04/2017    History of Present Illness [name] admitted 2/19 with reports of fever, cough, right lower back pain and concern for pneumonia. PMH CAD, ITP on chronic prednisone (2.5mg  QD).  Found to have strep pneumoniae PNA.  Progressed to respiratory failure, intubated 2/21.      PT Comments    Assisted OOB to amb 3 feet required + 2 assist and increased time.  Very limited activity tolerance and Mod Dyspnea.   Follow Up Recommendations  SNF;Supervision/Assistance - 24 hour     Equipment Recommendations       Recommendations for Other Services       Precautions / Restrictions Precautions Precautions: Fall Restrictions Weight Bearing Restrictions: No    Mobility  Bed Mobility Overal bed mobility: Needs Assistance Bed Mobility: Supine to Sit     Supine to sit: +2 for physical assistance;Total assist;Max assist     General bed mobility comments: + 2 assist to transfer to EOB mainly due to multiple lines/IV   Once upright pt was able to static sit EOB x 7 min as MD came in to perform Ultra Sound to posterior lungs   Transfers Overall transfer level: Needs assistance Equipment used: Rolling walker (2 wheeled) Transfers: Sit to/from [employer] Sit to Stand: Max assist;+2 physical assistance;+2 safety/equipment;From elevated surface Stand pivot transfers: Max assist;+2 physical assistance;+2 safety/equipment       General transfer comment: 25% VC's on proper tech and direction to decrease anxiety  Ambulation/Gait Ambulation/Gait assistance: Max assist;Mod assist;+2 physical assistance;+2 safety/equipment Ambulation Distance (Feet): 3 Feet Assistive device: Rolling walker (2 wheeled) Gait Pattern/deviations: Step-to pattern;Decreased step length - right;Decreased step length - left Gait velocity: decreased   General Gait Details: + 2 side by  side assist and son following with recliner, pt was only able to amb 3 feet due to weakness and dyspnea.  Remained on 2 lts O2.   Stairs            Wheelchair Mobility    Modified Rankin (Stroke Patients Only)       Balance                                            Cognition Arousal/Alertness: Awake/alert Behavior During Therapy: Anxious(with activity)                                   General Comments: AxO x 4   awake   soft weak voice      Exercises      General Comments        Pertinent Vitals/Pain Pain Assessment: Faces Faces Pain Scale: Hurts a little bit Pain Location: chronic back Pain Descriptors / Indicators: Constant Pain Intervention(s): Monitored during session;Repositioned    Home Living                      Prior Function            PT Goals (current goals can now be found in the care plan section) Progress towards PT goals: Progressing toward goals    Frequency    Min 2X/week      PT Plan Current plan remains appropriate    Co-evaluation  AM-PAC PT "6 Clicks" Daily Activity  Outcome Measure  Difficulty turning over in bed (including adjusting bedclothes, sheets and blankets)?: A Lot Difficulty moving from lying on back to sitting on the side of the bed? : A Lot Difficulty sitting down on and standing up from a chair with arms (e.g., wheelchair, bedside commode, etc,.)?: A Lot Help needed moving to and from a bed to chair (including a wheelchair)?: A Lot Help needed walking in hospital room?: A Lot Help needed climbing 3-5 steps with a railing? : A Lot 6 Click Score: 12    End of Session Equipment Utilized During Treatment: Gait belt;Oxygen Activity Tolerance: Patient limited by fatigue;Treatment limited secondary to medical complications (Comment) Patient left: in bed;with call bell/phone within reach Nurse Communication: Mobility status PT Visit Diagnosis:  Muscle weakness (generalized) (M62.81);Difficulty in walking, not elsewhere classified (R26.2)     Time: 1610-9604 PT Time Calculation (min) (ACUTE ONLY): 29 min  Charges:  $Gait Training: 8-22 mins $Therapeutic Activity: 8-22 mins                    G Codes:       Felecia Shelling  PTA [facility]  Acute  Rehab Pager      [phone]

## 2017-07-04 NOTE — Procedures (Signed)
Thoracentesis Procedure Note  Pre-operative Diagnosis: right effusion   Post-operative Diagnosis: right effusion   Indications: evaluation of pleural fluid   Procedure Details  Consent: Informed consent was obtained. Risks of the procedure were discussed including: infection, bleeding, pain, pneumothorax.  Under sterile conditions the patient was positioned. Betadine solution and sterile drapes were utilized.  1% buffered lidocaine was used to anesthetize the  rib space which was identified via real time [place]. Fluid was obtained without any difficulties and minimal blood loss.  A dressing was applied to the wound and wound care instructions were provided.   Findings 550 ml of clear pleural fluid was obtained. A sample was sent to Pathology for cytogenetics, flow, and cell counts, as well as for infection analysis.  Complications:  None; patient tolerated the procedure well.          Condition: stable  Erick Colace ACNP-BC [facility] Pager # [phone] OR # [phone] if no answer

## 2017-07-04 NOTE — Progress Notes (Signed)
PULMONARY / CRITICAL CARE MEDICINE   Name: Tamara Robertson MRN: [id number] DOB: 12/07/1929    ADMISSION DATE:  06/25/2017 CONSULTATION DATE:  06/27/17  REFERRING MD:  Dr. Benjamine Mola / [facility]   CHIEF COMPLAINT:  Respiratory distress   BRIEF SUMMARY:  [name] admitted 2/19 with reports of fever, cough, right lower back pain and concern for pneumonia. PMH CAD, ITP on chronic prednisone (2.5mg  QD).  Found to have strep pneumoniae PNA.  Progressed to respiratory failure, intubated 2/21.     SUBJECTIVE:   Looks great VITAL SIGNS: BP (!) 160/50   Pulse (!) 54   Temp 97.7 F (36.5 C) (Axillary)   Resp (!) 23   Ht 5\' 2"  (1.575 m)   Wt 154 lb 5.2 oz (70 kg)   SpO2 98%   BMI 28.23 kg/m   HEMODYNAMICS:    VENTILATOR SETTINGS:   Filed Weights   07/02/17 0500 07/03/17 0400 07/04/17 0500  Weight: 156 lb 8.4 oz (71 kg) 154 lb 15.7 oz (70.3 kg) 154 lb 5.2 oz (70 kg)   INTAKE / OUTPUT:  Intake/Output Summary (Last 24 hours) at 07/04/2017 4098 Last data filed at 07/04/2017 0900 Gross per 24 hour  Intake 1237.5 ml  Output 4270 ml  Net -3032.5 ml     PHYSICAL EXAMINATION: General: This is a 84 year old female currently resting in bed no acute distress HEENT, still has some periorbital edema but otherwise normocephalic no JVD Pulmonary: Decreased bases no accessory use Cardiac: Regular rate and rhythm does have aortic murmur Abdomen: Soft nontender Extremities: Dependent edema, scattered areas of ecchymosis Abdomen: Soft nontender Neuro: Awake oriented and appropriate today LABS:  BMET Recent Labs  Lab 07/02/17 0309 07/03/17 0255 07/04/17 0252  NA 146* 147* 137  K 3.0* 3.6 4.2  CL 105 103 96*  CO2 29 32 32  BUN 35* 41* 28*  CREATININE 0.59 0.47 0.57  GLUCOSE 162* 147* 98    Electrolytes Recent Labs  Lab 07/02/17 0309 07/03/17 0255 07/04/17 0252  CALCIUM 8.3* 8.6* 8.4*  MG 2.0 2.0 1.9  PHOS 2.8 3.1 3.8    CBC Recent Labs  Lab 07/02/17 0309 07/03/17 0255  07/04/17 0252  WBC 23.8* 23.5* 24.4*  HGB 10.2* 10.5* 11.8*  HCT 30.2* 31.4* 35.8*  PLT 361 402* 487*    Coag's No results for input(s): APTT, INR in the last 168 hours.  Sepsis Markers Recent Labs  Lab 07/02/17 0309 07/03/17 0255 07/04/17 0252  PROCALCITON 2.30 1.51 1.10    ABG Recent Labs  Lab 06/27/17 1728  PHART 7.340*  PCO2ART 42.7  PO2ART 322*    Liver Enzymes No results for input(s): AST, ALT, ALKPHOS, BILITOT, ALBUMIN in the last 168 hours.  Cardiac Enzymes No results for input(s): TROPONINI, PROBNP in the last 168 hours.  Glucose Recent Labs  Lab 07/03/17 1134 07/03/17 1540 07/03/17 1944 07/03/17 2323 07/04/17 0326 07/04/17 0757  GLUCAP 146* 122* 113* 82 94 91    Imaging Dg Chest Port 1 View  Result Date: 07/04/2017 CLINICAL DATA:  Pneumonia. EXAM: PORTABLE CHEST 1 VIEW COMPARISON:  07/02/2017. FINDINGS: Patient rotated to the right. Interim extubation and removal of NG tube. Persistent but improving right base infiltrate. Near complete clearing of left base infiltrate. Small residual right pleural effusion. No left pleural effusion noted on today's exam. No pneumothorax. IMPRESSION: 1.  Interim extubation and removal of NG tube. 2. Persistent but improving right base infiltrate. Interim near complete resolution of left base infiltrate. Small residual right  pleural effusion. No left pleural effusion noted on today's exam. Electronically Signed   By: Maisie Fus  Register   On: 07/04/2017 07:14     STUDIES:  2/21 CXR >> progression of RLL infiltrate 2/22 ECHO >> systolic EF 60-65%. Wall motion nml, + diastolic dysfxn but not able to determine grade. Mode AS CT head 2/25: No acute findings CULTURES: BCx2 2/19 >> neg Sputum 2/19 >>  RVP 2/22 >>neg Urine strep +  ANTIBIOTICS: Rocephin 2/19 >>  Azithromycin 2/19 >> 2/20  SIGNIFICANT EVENTS: 2/19  Admit with cough, SOB, RLL PNA  2/21  Tx to ICU  LINES/TUBES:  Extubated 2/27  DISCUSSION: 84  y/o F admitted with RLL Strep Pneumo PNA.  Developed progressive respiratory distress, transferred to ICU 2/21.   Cont to improve  Will look at right chest w/ [place] If sig fluid needs tap  ASSESSMENT / PLAN:  Acute Hypoxic respiratory failure in setting of Pneumococcal PNA and ALI Chest cont to improve. Left side now clear. Right side w/ still persistent airspace disease as well as element of effusion.  Plan Day # 10 rocephin; PCT cont to normalize. Would dc when PCT < 0.5 Will look at right chest w/ [place]. Needs diagnostic/therapeutic thora if sig fluid collection  Cont wean oxygen mobilize.  Acute encephalopathy -->this has been major barrier to extubation and weaning -She is remarkably improved.  She is now interactive, following commands, more appropriate.  No longer appears to be hallucinating. Plan Dc seroquel Cont melatonin at HS No narcotics or benzos OOB PT consult  Hx CAD, HLD Murmur - no documented hx; echo showing mild AS Hypertriglyceridemia - on propofol -->improved after stopping  Hypertension Bradycardia, suspect this was actually more in part due to labetalol than Precedex Plan Cont tele Hold diuretics Adding back scheduled hydralazine Add low dose BB  Fluid and electrolyte imbalance: Hypernatremia and hyperchloremia, and hypokalemia; sodium better  Plan KVO d5W Holding torsemide  Repeat am chem    Chronic Thrombocytopenia - followed by Dr. Clelia Croft as outpatient; Hx ITP - on 2.5 mg Prednisone at baseline  Plan Change pred back to 2.5 mg/d  Hx GERD, Hepatic Steatosis, IBS  Plan Cont H2B  Mild anemia w/out evidence of bleeding Plan Trend cbc Transfuse per protocol   Mild Hyperglycemia  Plan ssi   Hip Pain Hx Bursitis  Plan Cont lidoderm patch (will increase)  DVT prophylaxis: scds SUP: H2B Diet: reg Activity: OOB Disposition : ICU     Simonne Martinet ACNP-BC [facility] Pulmonary/Critical Care Pager # [phone] OR # [phone] if no  answer

## 2017-07-05 DIAGNOSIS — J154 Pneumonia due to other streptococci: Secondary | ICD-10-CM

## 2017-07-05 DIAGNOSIS — R509 Fever, unspecified: Secondary | ICD-10-CM

## 2017-07-05 DIAGNOSIS — J181 Lobar pneumonia, unspecified organism: Secondary | ICD-10-CM

## 2017-07-05 DIAGNOSIS — R05 Cough: Secondary | ICD-10-CM

## 2017-07-05 DIAGNOSIS — M25552 Pain in left hip: Secondary | ICD-10-CM

## 2017-07-05 DIAGNOSIS — J9601 Acute respiratory failure with hypoxia: Secondary | ICD-10-CM

## 2017-07-05 DIAGNOSIS — J90 Pleural effusion, not elsewhere classified: Secondary | ICD-10-CM

## 2017-07-05 DIAGNOSIS — Z9889 Other specified postprocedural states: Secondary | ICD-10-CM

## 2017-07-05 LAB — CBC
HEMATOCRIT: 34.4 % — AB (ref 36.0–46.0)
Hemoglobin: 11.7 g/dL — ABNORMAL LOW (ref 12.0–15.0)
MCH: 28.6 pg (ref 26.0–34.0)
MCHC: 34 g/dL (ref 30.0–36.0)
MCV: 84.1 fL (ref 78.0–100.0)
Platelets: 531 10*3/uL — ABNORMAL HIGH (ref 150–400)
RBC: 4.09 MIL/uL (ref 3.87–5.11)
RDW: 15 % (ref 11.5–15.5)
WBC: 21.2 10*3/uL — AB (ref 4.0–10.5)

## 2017-07-05 LAB — BASIC METABOLIC PANEL
ANION GAP: 8 (ref 5–15)
BUN: 19 mg/dL (ref 6–20)
CALCIUM: 8.3 mg/dL — AB (ref 8.9–10.3)
CHLORIDE: 100 mmol/L — AB (ref 101–111)
CO2: 29 mmol/L (ref 22–32)
Creatinine, Ser: 0.59 mg/dL (ref 0.44–1.00)
GFR calc Af Amer: >60 mL/min (ref >60)
GFR calc non Af Amer: >60 mL/min (ref >60)
GLUCOSE: 93 mg/dL (ref 65–99)
Potassium: 5 mmol/L (ref 3.5–5.1)
Sodium: 137 mmol/L (ref 135–145)

## 2017-07-05 LAB — GLUCOSE, CAPILLARY
GLUCOSE-CAPILLARY: 89 mg/dL (ref 65–99)
GLUCOSE-CAPILLARY: 98 mg/dL (ref 65–99)
Glucose-Capillary: 87 mg/dL (ref 65–99)
Glucose-Capillary: 104 mg/dL — ABNORMAL HIGH (ref 65–99)
Glucose-Capillary: 128 mg/dL — ABNORMAL HIGH (ref 65–99)

## 2017-07-05 LAB — LACTATE DEHYDROGENASE: LDH: 374 U/L — ABNORMAL HIGH (ref 98–192)

## 2017-07-05 LAB — MAGNESIUM: Magnesium: 2.2 mg/dL (ref 1.7–2.4)

## 2017-07-05 LAB — PHOSPHORUS: Phosphorus: 4 mg/dL (ref 2.5–4.6)

## 2017-07-05 LAB — PROCALCITONIN: Procalcitonin: 0.67 ng/mL

## 2017-07-05 MED ORDER — PANTOPRAZOLE SODIUM 40 MG PO TBEC
40.0000 mg | DELAYED_RELEASE_TABLET | Freq: Every day | ORAL | Status: DC
  Administered 2017-07-06 – 2017-07-09 (×4): 40 mg via ORAL
  Filled 2017-07-05 (×4): qty 1

## 2017-07-05 MED ORDER — TRAZODONE HCL 50 MG PO TABS
50.0000 mg | ORAL_TABLET | Freq: Once | ORAL | Status: AC
  Administered 2017-07-05: 50 mg via ORAL
  Filled 2017-07-05: qty 1

## 2017-07-05 MED ORDER — IPRATROPIUM-ALBUTEROL 0.5-2.5 (3) MG/3ML IN SOLN: 3.0000 mL | Freq: Four times a day (QID) | RESPIRATORY_TRACT | Status: DC | PRN

## 2017-07-05 NOTE — Progress Notes (Signed)
CSW contacted patient's daughter to inquire about patient's new insurance coverage. CSW left voicemail requesting return phone call. Patient's insurance coverage will need to be verified for SNF rehab coverage. CSW will continue to follow and assist with discharge planning.  Abundio Miu, [place] Social Worker [facility] Cell#: [phone]

## 2017-07-05 NOTE — Clinical Social Work Note (Signed)
Clinical Social Work Assessment  Patient Details  Name: Tamara Robertson MRN: [id number] Date of Birth: 11-21-1929  Date of referral:  07/05/17               Reason for consult:  Facility Placement                Permission sought to share information with:  Facility Sport and exercise psychologist, Family Supports Permission granted to share information::  Yes, Verbal Permission Granted  Name::     Ina Homes  Agency::     Relationship::  Daughter  Contact Information:  [phone]  Housing/Transportation Living arrangements for the past 2 months:  Single Family Home Source of Information:  Patient Patient Interpreter Needed:  None Criminal Activity/Legal Involvement Pertinent to Current Situation/Hospitalization:  No - Comment as needed Significant Relationships:  Adult Children Lives with:  Self Do you feel safe going back to the place where you live?  (PT recommending SNF) Need for family participation in patient care:  Yes (Comment)  Care giving concerns:  Patient from home alone. Patient reported that she was independent with ADLs and ambulation at baseline. PT recommending SNF.   Social Worker assessment / plan:  CSW spoke with patient at bedside regarding PT recommendation for SNF for ST rehab, patient agreeable. CSW explained SNF placement process and insurance authorization, patient verbalized understanding. Patient reported that she has new insurance effective today with [employer]. Patient reported that her new insurance card is at home and that her daughter can bring it to the hospital. Patient granted CSW verbal permission to contact her daughter to discuss discharge planning if needed.  CSW will complete FL2 and follow up with bed offers. CSW will attempt to verify patient's current insurance coverage for SNF benefits. CSW will continue to follow and assist with discharge planning.  Employment status:  Retired Nurse, adult PT Recommendations:   [place] / Referral to community resources:  [place]  Patient/Family's Response to care:  Patient appreciative of CSW assistance with discharge planning.  Patient/Family's Understanding of and Emotional Response to Diagnosis, Current Treatment, and Prognosis:  Patient presented calm and cooperative. Patient verbalized some understanding of current treatment and reported that she is feeling better. Patient verbalized plan to dc to SNF for ST rehab.  Emotional Assessment Appearance:  Appears stated age Attitude/Demeanor/Rapport:  Other(Cooperative) Affect (typically observed):  Calm Orientation:  Oriented to Self, Oriented to Place, Oriented to  Time, Oriented to Situation Alcohol / Substance use:  Not Applicable Psych involvement (Current and /or in the community):  No (Comment)  Discharge Needs  Concerns to be addressed:  Care Coordination Readmission within the last 30 days:  No Current discharge risk:  Physical Impairment Barriers to Discharge:  Continued Medical Work up   [employer], LCSW 07/05/2017, 3:40 PM

## 2017-07-05 NOTE — Progress Notes (Signed)
PROGRESS NOTE    Tamara Robertson  ZOX:[id number] DOB: 16-Jul-1929 DOA: 06/25/2017 PCP: Pincus Sanes, MD    Brief Narrative: [name] admitted 2/19 with reports of fever, cough, right lower back pain and concern for pneumonia. PMH CAD, ITP on chronic prednisone (2.5mg  QD).  Found to have strep pneumoniae PNA.  Progressed to respiratory failure, intubated 2/21 and extubated on 2/27.  Patient was transferred to trh  on 2/28.    Assessment & Plan:   Principal Problem:   Community acquired pneumonia of right lower lobe of lung (HCC) Active Problems:   Severe sepsis with acute organ dysfunction (HCC)   AKI (acute kidney injury) (HCC)   Current chronic use of systemic steroids   Streptococcal pneumonia (HCC)   Acute respiratory failure with hypoxia (HCC)   Pleural effusion on right  Acute respiratory failure secondary to right lower lobe pneumonia Breathing continues to improve repeat chest x-ray showed some right sided persistent airspace disease.  Completed the course of antibiotic with Rocephin.  ultrasound of the right chest showed right pleural effusion possibly parapneumonic effusion. She underwent thoracentesis and fluid sent for analysis.    Patient was intubated on the 21st and extubated on the 27th, and pulmonology following the patient. CXR shows resolution of the pleural effusion. She was also weaned off the oxygen.    Acute encephalopathy probably secondary to narcotic medication.  Currently she is alert awake and oriented to place and person, following commands.  Recommend PT evaluation.   History of chronic thrombocytopenia follows up with Dr. Clelia Croft as an outpatient has a history of ITP from aspirin use. Platelet counts are in 400s at this time. Patient on prednisone  daily   Mild normocytic anemia without any evidence of bleeding continue to monitor.  Patient reports right hip pain with history of bursitis.  Continue with Lidoderm patch, non narcotic pain medication as  needed. Hip pain improved. Recommend physical therapy.    Hypertension Well controlled blood pressure parameters.    Hypernatremia, Hypokalemia  In part due to decreased p.o. intake and free water deficit.  Start oral fluid hydration. Holding torsemide.  Repeat BMP shows normal sodium today. Replace potassium as needed.   Leukocytosis partly secondary to community-acquired pneumonia and partly due to prednisone. Improving.   Pro-calcitonin appears to be improving.  DVT prophylaxis: scd's Code Status:  Full code.  Family Communication: daughter at bedside.   Disposition Plan: transfer to telemetry.     STUDIES:  2/21 CXR >> progression of RLL infiltrate 2/22 ECHO >> systolic EF 60-65%. Wall motion nml, + diastolic dysfxn but not able to determine grade. Mode AS CT head 2/25: No acute findings   CULTURES: BCx2 2/19 >> neg Sputum 2/19 >>  RVP 2/22 >>neg Urine strep +    ANTIBIOTICS: Rocephin 2/19 >>  Azithromycin 2/19 >> 2/20   SIGNIFICANT EVENTS: 2/19  Admit with cough, SOB, RLL PNA  2/21  Tx to ICU   LINES/TUBES: Extubated 2/27    Subjective: Left hip pain better.  Appetite is good.  Breathing has improved.  Complaining of HEARTBURN.   Objective: Vitals:   07/05/17 0525 07/05/17 0700 07/05/17 0715 07/05/17 0800  BP: (!) 182/54   (!) 164/46  Pulse:      Resp: (!) 27 (!) 28 (!) 27 (!) 22  Temp:    99.2 F (37.3 C)  TempSrc:    Oral  SpO2: 95% 95% 94% 94%  Weight:      Height:  Intake/Output Summary (Last 24 hours) at 07/05/2017 1034 Last data filed at 07/05/2017 0900 Gross per 24 hour  Intake 1144 ml  Output 2700 ml  Net -1556 ml   Filed Weights   07/03/17 0400 07/04/17 0500 07/05/17 0500  Weight: 70.3 kg (154 lb 15.7 oz) 70 kg (154 lb 5.2 oz) 67.5 kg (148 lb 13 oz)    Examination:  General exam: calm and comfortable.  Respiratory system: Clear to auscultation. Respiratory effort normal. No wheezing or rhonchi.  Cardiovascular  system:S1S2, NO JVD, no murmer.  Gastrointestinal system: abd is soft non tender non distended bowel sounds heard and good.  Central nervous system: Alert and oriented. No focal neurological deficits. Extremities: Symmetric 5 x 5 power. Skin: No rashes, lesions or ulcers Psychiatry:  Mood & affect appropriate.     Data Reviewed: I have personally reviewed following labs and imaging studies  CBC: Recent Labs  Lab 07/01/17 0922 07/02/17 0309 07/03/17 0255 07/04/17 0252 07/05/17 0319  WBC 22.1* 23.8* 23.5* 24.4* 21.2*  HGB 11.3* 10.2* 10.5* 11.8* 11.7*  HCT 33.7* 30.2* 31.4* 35.8* 34.4*  MCV 84.5 84.1 84.9 83.4 84.1  PLT 298 361 402* 487* 531*   Basic Metabolic Panel: Recent Labs  Lab 07/01/17 0922 07/02/17 0309 07/03/17 0255 07/04/17 0252 07/05/17 0319  NA 150* 146* 147* 137 137  K 3.9 3.0* 3.6 4.2 5.0  CL 114* 105 103 96* 100*  CO2 24 29 32 32 29  GLUCOSE 154* 162* 147* 98 93  BUN 40* 35* 41* 28* 19  CREATININE 0.58 0.59 0.47 0.57 0.59  CALCIUM 8.9 8.3* 8.6* 8.4* 8.3*  MG  --  2.0 2.0 1.9 2.2  PHOS  --  2.8 3.1 3.8 4.0   GFR: Estimated Creatinine Clearance: 46.3 mL/min (by C-G formula based on SCr of 0.59 mg/dL). Liver Function Tests: No results for input(s): AST, ALT, ALKPHOS, BILITOT, PROT, ALBUMIN in the last 168 hours. No results for input(s): LIPASE, AMYLASE in the last 168 hours. No results for input(s): AMMONIA in the last 168 hours. Coagulation Profile: No results for input(s): INR, PROTIME in the last 168 hours. Cardiac Enzymes: No results for input(s): CKTOTAL, CKMB, CKMBINDEX, TROPONINI in the last 168 hours. BNP (last 3 results) No results for input(s): PROBNP in the last 8760 hours. HbA1C: No results for input(s): HGBA1C in the last 72 hours. CBG: Recent Labs  Lab 07/04/17 1533 07/04/17 1934 07/04/17 2313 07/05/17 0341 07/05/17 0729  GLUCAP 98 116* 86 87 89   Lipid Profile: Recent Labs    07/04/17 1150  CHOL 196   Thyroid Function  Tests: No results for input(s): TSH, T4TOTAL, FREET4, T3FREE, THYROIDAB in the last 72 hours. Anemia Panel: No results for input(s): VITAMINB12, FOLATE, FERRITIN, TIBC, IRON, RETICCTPCT in the last 72 hours. Sepsis Labs: Recent Labs  Lab 07/02/17 0309 07/03/17 0255 07/04/17 0252 07/05/17 0319  PROCALCITON 2.30 1.51 1.10 0.67    Recent Results (from the past 240 hour(s))  Culture, blood (Routine x 2)     Status: None   Collection Time: 06/25/17  6:53 PM  Result Value Ref Range Status   Specimen Description   Final    BLOOD RIGHT ANTECUBITAL Performed at [facility], [address] [place], [place] [address]    Special Requests   Final    BOTTLES DRAWN AEROBIC AND ANAEROBIC Blood Culture adequate volume   Culture   Final    NO GROWTH 5 DAYS Performed at [facility], [address] [place], [place] [address]    Report Status 06/30/2017 FINAL  Final  Culture, blood (Routine x 2)     Status: None   Collection Time: 06/25/17  8:07 PM  Result Value Ref Range Status   Specimen Description   Final    BLOOD LEFT HAND Performed at [facility], [address] [place], [place] [address]    Special Requests   Final    BOTTLES DRAWN AEROBIC AND ANAEROBIC Blood Culture adequate volume Performed at [facility], [address], [place], [place] [address]    Culture   Final    NO GROWTH 5 DAYS Performed at [facility], [address] [place], [place] [address]    Report Status 06/30/2017 FINAL  Final  Respiratory Panel by PCR     Status: None   Collection Time: 06/27/17  2:26 PM  Result Value Ref Range Status   Adenovirus NOT DETECTED NOT DETECTED Final   Coronavirus 229E NOT DETECTED NOT DETECTED Final   Coronavirus HKU1 NOT DETECTED NOT DETECTED Final   Coronavirus NL63 NOT DETECTED NOT DETECTED Final   Coronavirus OC43 NOT DETECTED NOT DETECTED Final   Metapneumovirus NOT DETECTED NOT DETECTED Final    Rhinovirus / Enterovirus NOT DETECTED NOT DETECTED Final   Influenza A NOT DETECTED NOT DETECTED Final   Influenza B NOT DETECTED NOT DETECTED Final   Parainfluenza Virus 1 NOT DETECTED NOT DETECTED Final   Parainfluenza Virus 2 NOT DETECTED NOT DETECTED Final   Parainfluenza Virus 3 NOT DETECTED NOT DETECTED Final   Parainfluenza Virus 4 NOT DETECTED NOT DETECTED Final   Respiratory Syncytial Virus NOT DETECTED NOT DETECTED Final   Bordetella pertussis NOT DETECTED NOT DETECTED Final   Chlamydophila pneumoniae NOT DETECTED NOT DETECTED Final   Mycoplasma pneumoniae NOT DETECTED NOT DETECTED Final    Comment: Performed at [facility], [address], [place], [place] [address]  MRSA PCR Screening     Status: None   Collection Time: 06/27/17  2:26 PM  Result Value Ref Range Status   MRSA by PCR NEGATIVE NEGATIVE Final    Comment:        The GeneXpert MRSA Assay (FDA approved for NASAL specimens only), is one component of a comprehensive MRSA colonization surveillance program. It is not intended to diagnose MRSA infection nor to guide or monitor treatment for MRSA infections. Performed at [facility], [address] [place], [place] [address]   Body fluid culture     Status: None (Preliminary result)   Collection Time: 07/04/17 11:43 AM  Result Value Ref Range Status   Specimen Description   Final    PLEURAL Performed at [facility], [address], [place], [place] [address]    Special Requests   Final    NONE Performed at [facility], [address], [place], [place] [address]    Gram Stain   Final    FEW WBC PRESENT,BOTH PMN AND MONONUCLEAR NO ORGANISMS SEEN    Culture   Final    NO GROWTH < 24 HOURS Performed at [facility], [address], [place], [place] [address]    Report Status PENDING  Incomplete         Radiology Studies: Dg Chest Port 1 View  Result Date: 07/04/2017 CLINICAL  DATA:  Status post right thoracentesis. EXAM: PORTABLE CHEST 1 VIEW COMPARISON:  Radiograph of July 04, 2017. FINDINGS:  Stable cardiomediastinal silhouette and central pulmonary vascular congestion. No pneumothorax is noted. Stable right basilar opacity is noted concerning for infiltrate or atelectasis. No pleural effusion is noted. Minimal left pleural effusion is noted. IMPRESSION: No pneumothorax status post right-sided thoracentesis. No right pleural effusion is noted. Probable minimal left pleural effusion. Electronically Signed   By: Lupita Raider, M.D.   On: 07/04/2017 13:24   Dg Chest Port 1 View  Result Date: 07/04/2017 CLINICAL DATA:  Pneumonia. EXAM: PORTABLE CHEST 1 VIEW COMPARISON:  07/02/2017. FINDINGS: Patient rotated to the right. Interim extubation and removal of NG tube. Persistent but improving right base infiltrate. Near complete clearing of left base infiltrate. Small residual right pleural effusion. No left pleural effusion noted on today's exam. No pneumothorax. IMPRESSION: 1.  Interim extubation and removal of NG tube. 2. Persistent but improving right base infiltrate. Interim near complete resolution of left base infiltrate. Small residual right pleural effusion. No left pleural effusion noted on today's exam. Electronically Signed   By: Maisie Fus  Register   On: 07/04/2017 07:14   Dg Hip Port Unilat With Pelvis 1v Left  Result Date: 07/04/2017 CLINICAL DATA:  LEFT hip pain. EXAM: DG HIP (WITH OR WITHOUT PELVIS) 1V PORT LEFT COMPARISON:  06/26/2017 FINDINGS: There is no evidence of acute fracture, subluxation or dislocation. No focal bony lesions are present. Mild degenerative changes in the hips noted. IMPRESSION: No evidence of acute abnormality. Mild degenerative changes in the hips. Electronically Signed   By: Harmon Pier M.D.   On: 07/04/2017 13:34        Scheduled Meds: . artificial tears   Both Eyes Q8H  . hydrALAZINE  50 mg Oral Q8H  . insulin aspart  0-15 Units  Subcutaneous Q4H  . ipratropium-albuterol  3 mL Nebulization TID  . latanoprost  1 drop Both Eyes QHS  . lidocaine  2 patch Transdermal Q24H  . mouth rinse  15 mL Mouth Rinse BID  . Melatonin  5 mg Oral QHS  . metoprolol tartrate  25 mg Oral BID  . multivitamin  15 mL Oral Daily  . nystatin   Topical TID  . pantoprazole  40 mg Oral Q0600  . predniSONE  2.5 mg Oral Q breakfast  . ranitidine  150 mg Oral Daily   Continuous Infusions: . cefTRIAXone (ROCEPHIN)  IV Stopped (07/04/17 2208)  . dextrose 35 mL/hr at 07/05/17 0900     LOS: 10 days    Time spent: 35 minutes    Kathlen Mody, MD [facility] Pager [phone] If 7PM-7AM, please contact night-coverage [email or web address] Password [facility] 07/05/2017, 10:34 AM

## 2017-07-05 NOTE — [place] FL2 (Signed)
[place] MEDICAID FL2 LEVEL OF CARE SCREENING TOOL     IDENTIFICATION  Patient Name: Tamara Robertson Birthdate: [birth year] Sex: female Admission Date (Current Location): 06/25/2017  [facility] and [place] Number:  Producer, television/film/video and Address:  [facility],  [address] [place] [address]      Provider Number: [id number]  Attending Physician Name and Address:  Kathlen Mody, MD  Relative Name and Phone Number:       Current Level of Care: Hospital Recommended Level of Care: Skilled Nursing Facility Prior Approval Number:    Date Approved/Denied: 07/05/17 PASRR Number: [id number]  Discharge Plan: SNF    Current Diagnoses: Patient Active Problem List   Diagnosis Date Noted  . Pleural effusion on right   . Acute respiratory failure with hypoxia (HCC)   . Streptococcal pneumonia (HCC) 06/26/2017  . Dehydration 06/25/2017  . Community acquired pneumonia of right lower lobe of lung (HCC) 06/25/2017  . Fever 06/25/2017  . Cough 06/25/2017  . Severe sepsis with acute organ dysfunction (HCC) 06/25/2017  . AKI (acute kidney injury) (HCC) 06/25/2017  . Current chronic use of systemic steroids 06/25/2017  . Chronic lower back pain 05/14/2017  . Varicose veins of left lower extremity with complications 03/05/2017  . Bilateral hearing loss 02/01/2017  . Varicose veins of both lower extremities 09/20/2016  . Hypertension 07/31/2016  . Leg edema 07/31/2016  . Prediabetes 06/05/2016  . Osteopenia 12/01/2015  . Anxiety 07/19/2015  . Degenerative cervical disc 12/28/2014  . Left ovarian cyst   . Peripheral neuropathy 06/03/2012  . Insomnia   . PVD (peripheral vascular disease) (HCC) 11/23/2010  . Bursitis of hip 10/26/2010  . Hyperlipidemia 06/27/2010  . IMMUNE THROMBOCYTOPENIC PURPURA 06/27/2010  . GLUCOMA 06/27/2010  . GERD 06/27/2010  . IRRITABLE BOWEL SYNDROME 06/27/2010  . OVERACTIVE BLADDER 06/27/2010  . OSTEOARTHRITIS, KNEE, RIGHT 06/27/2010  .  URINARY INCONTINENCE 06/27/2010    Orientation RESPIRATION BLADDER Height & Weight     Time, Self, Situation, Place  Normal Continent Weight: 148 lb 13 oz (67.5 kg) Height:  5\' 2"  (157.5 cm)  BEHAVIORAL SYMPTOMS/MOOD NEUROLOGICAL BOWEL NUTRITION STATUS      Continent Diet(See dc summary)  AMBULATORY STATUS COMMUNICATION OF NEEDS Skin   Extensive Assist Verbally Normal                       Personal Care Assistance Level of Assistance  Bathing, Feeding, Dressing Bathing Assistance: Limited assistance Feeding assistance: Independent Dressing Assistance: Limited assistance     Functional Limitations Info  Sight, Hearing, Speech Sight Info: Impaired Hearing Info: Impaired Speech Info: Adequate    SPECIAL CARE FACTORS FREQUENCY  PT (By licensed PT)     PT Frequency: 5x/week OT Frequency: 5x/week            Contractures Contractures Info: Not present    Additional Factors Info  Code Status, Allergies Code Status Info: Full Allergies Info:  Aspirin, Sulfa Antibiotics, Contrast Media Iodinated Diagnostic Agents, Lasix Furosemide, Statins           Current Medications (07/05/2017):  This is the current hospital active medication list Current Facility-Administered Medications  Medication Dose Route Frequency Provider Last Rate Last Dose  . acetaminophen (TYLENOL) solution 1,000 mg  1,000 mg Oral Q6H PRN Deterding, Dorise Hiss, MD   1,000 mg at 07/04/17 0232  . albuterol (PROVENTIL) (2.5 MG/3ML) 0.083% nebulizer solution 2.5 mg  2.5 mg Nebulization Q2H PRN Joseph Art, DO  2.5 mg at 06/27/17 1118  . cefTRIAXone (ROCEPHIN) 2 g in sodium chloride 0.9 % 100 mL IVPB  2 g Intravenous Q24H Simonne Martinet, NP   Stopped at 07/04/17 2208  . dextrose 5 % solution   Intravenous Continuous Simonne Martinet, NP 35 mL/hr at 07/05/17 0900    . hydrALAZINE (APRESOLINE) tablet 50 mg  50 mg Oral Q8H Simonne Martinet, NP   50 mg at 07/05/17 1610  . ibuprofen (ADVIL,MOTRIN) 100  MG/5ML suspension 600 mg  600 mg Oral Q8H PRN Simonne Martinet, NP      . insulin aspart (novoLOG) injection 0-15 Units  0-15 Units Subcutaneous Q4H Ollis, Brandi L, NP   2 Units at 07/04/17 1149  . ipratropium-albuterol (DUONEB) 0.5-2.5 (3) MG/3ML nebulizer solution 3 mL  3 mL Nebulization Q6H PRN Kathlen Mody, MD      . latanoprost (XALATAN) 0.005 % ophthalmic solution 1 drop  1 drop Both Eyes QHS Lyda Perone M, DO   1 drop at 07/04/17 2123  . lidocaine (LIDODERM) 5 % 2 patch  2 patch Transdermal Q24H Simonne Martinet, NP   2 patch at 07/05/17 1000  . MEDLINE mouth rinse  15 mL Mouth Rinse BID Max Fickle B, MD   15 mL at 07/05/17 1000  . Melatonin TABS 5 mg  5 mg Oral QHS Kathlen Mody, MD   5 mg at 07/04/17 2123  . metoprolol tartrate (LOPRESSOR) 25 mg/10 mL oral suspension 25 mg  25 mg Oral BID Kathlen Mody, MD   25 mg at 07/05/17 1000  . multivitamin liquid 15 mL  15 mL Oral Daily Kathlen Mody, MD   Stopped at 07/05/17 [phone]  . nystatin (MYCOSTATIN/NYSTOP) topical powder   Topical TID Simonne Martinet, NP      . ondansetron [facility]) injection 4 mg  4 mg Intravenous Q6H PRN Marlin Canary U, DO   4 mg at 07/05/17 1012  . pantoprazole (PROTONIX) EC tablet 40 mg  40 mg Oral Q0600 Kathlen Mody, MD      . polyethylene glycol (MIRALAX / GLYCOLAX) packet 17 g  17 g Oral Daily PRN Hillary Bow, DO      . predniSONE 5 MG/5ML solution 2.5 mg  2.5 mg Oral Q breakfast Kathlen Mody, MD   2.5 mg at 07/05/17 1000  . ranitidine (ZANTAC) 150 MG/10ML syrup 150 mg  150 mg Oral Daily Kathlen Mody, MD         Discharge Medications: Please see discharge summary for a list of discharge medications.  Relevant Imaging Results:  Relevant Lab Results:   Additional Information ssn: [id number]  Coralyn Helling, LCSW

## 2017-07-06 LAB — CBC WITH DIFFERENTIAL/PLATELET
Basophils Absolute: 0 10*3/uL (ref 0.0–0.1)
Basophils Relative: 0 %
EOS ABS: 0.3 10*3/uL (ref 0.0–0.7)
EOS PCT: 2 %
HCT: 33.3 % — ABNORMAL LOW (ref 36.0–46.0)
Hemoglobin: 11 g/dL — ABNORMAL LOW (ref 12.0–15.0)
LYMPHS ABS: 2.3 10*3/uL (ref 0.7–4.0)
LYMPHS PCT: 14 %
MCH: 27.6 pg (ref 26.0–34.0)
MCHC: 33 g/dL (ref 30.0–36.0)
MCV: 83.5 fL (ref 78.0–100.0)
MONO ABS: 1.8 10*3/uL — AB (ref 0.1–1.0)
MONOS PCT: 12 %
Neutro Abs: 11.4 10*3/uL — ABNORMAL HIGH (ref 1.7–7.7)
Neutrophils Relative %: 72 %
PLATELETS: 445 10*3/uL — AB (ref 150–400)
RBC: 3.99 MIL/uL (ref 3.87–5.11)
RDW: 15 % (ref 11.5–15.5)
WBC: 15.8 10*3/uL — AB (ref 4.0–10.5)

## 2017-07-06 LAB — GLUCOSE, CAPILLARY
GLUCOSE-CAPILLARY: 90 mg/dL (ref 65–99)
Glucose-Capillary: 100 mg/dL — ABNORMAL HIGH (ref 65–99)
Glucose-Capillary: 103 mg/dL — ABNORMAL HIGH (ref 65–99)
Glucose-Capillary: 107 mg/dL — ABNORMAL HIGH (ref 65–99)
Glucose-Capillary: 87 mg/dL (ref 65–99)
Glucose-Capillary: 89 mg/dL (ref 65–99)

## 2017-07-06 LAB — PROCALCITONIN: Procalcitonin: 0.42 ng/mL

## 2017-07-06 LAB — RHEUMATOID FACTORS, FLUID: RHEUMATOID ARTHRITIS, QN/FLUID: NEGATIVE

## 2017-07-06 NOTE — Progress Notes (Signed)
PROGRESS NOTE    Tamara Robertson  WJX:[id number] DOB: [birth year] DOA: 06/25/2017 PCP: Pincus Sanes, MD    Brief Narrative: [name] admitted 2/19 with reports of fever, cough, right lower back pain and concern for pneumonia. PMH CAD, ITP on chronic prednisone (2.5mg  QD).  Found to have strep pneumoniae PNA.  Progressed to respiratory failure, intubated 2/21 and extubated on 2/27.  Patient was transferred to [facility]  on 2/28. Pt remains afebrile, denies any breathing issues, no cough or sob or chest pain.     Assessment & Plan:   Principal Problem:   Community acquired pneumonia of right lower lobe of lung (HCC) Active Problems:   Severe sepsis with acute organ dysfunction (HCC)   AKI (acute kidney injury) (HCC)   Current chronic use of systemic steroids   Streptococcal pneumonia (HCC)   Acute respiratory failure with hypoxia (HCC)   Pleural effusion on right  Acute respiratory failure secondary to right lower lobe pneumonia Breathing continues to improve repeat chest x-ray showed some right sided persistent airspace disease.  Completed the course of antibiotic with Rocephin.  ultrasound of the right chest showed right pleural effusion possibly parapneumonic effusion. She underwent thoracentesis and fluid sent for analysis. Wbc count in pleural fluid elevated greater than 1000. Fluid cultures are negative so far.   Patient was intubated on the 21st and extubated on the 27th, and pulmonology following the patient. CXR shows resolution of the pleural effusion. She was also weaned off the oxygen.  PT evaluation recommended SNF placement.    Acute encephalopathy probably secondary to narcotic medication.  Currently she is alert awake and oriented to place and person, following commands. No changes in mental status.  Recommend PT evaluation.   History of chronic thrombocytopenia follows up with Dr. Clelia Croft as an outpatient has a history of ITP from aspirin use. Platelet counts are in 400s at  this time. Patient on prednisone  daily   Mild normocytic anemia without any evidence of bleeding continue to monitor. Hemoglobin is stable around 11.   Patient reports right hip pain with history of bursitis.  Continue with Lidoderm patch, non narcotic pain medication as needed. Hip pain improved. Recommend physical therapy, inturn will need SNF on discharge.    Hypertension Well controlled blood pressure parameters.    Hypernatremia, Hypokalemia  In part due to decreased p.o. intake and free water deficit. Resolved with hydration and potassium replaced.   Leukocytosis partly secondary to community-acquired pneumonia and partly due to prednisone. Improving.   Pro-calcitonin appears to be improving. Wbc is 15,000 today.   DVT prophylaxis: scd's Code Status:  Full code.  Family Communication: none at bedside. Disposition Plan: SNF on discharge.     STUDIES:  2/21 CXR >> progression of RLL infiltrate 2/22 ECHO >> systolic EF 60-65%. Wall motion nml, + diastolic dysfxn but not able to determine grade. Mode AS CT head 2/25: No acute findings   CULTURES: BCx2 2/19 >> neg Sputum 2/19 >>  RVP 2/22 >>neg Urine strep +    ANTIBIOTICS: Rocephin 2/19 >> 3/2 Azithromycin 2/19 >> 2/20   SIGNIFICANT EVENTS: 2/19  Admit with cough, SOB, RLL PNA  2/21  Tx to ICU   LINES/TUBES: Extubated 2/27    Subjective: Heart burn resolved.  Reports some mild hip discomfort.   Objective: Vitals:   07/05/17 1500 07/05/17 2046 07/06/17 0454 07/06/17 1409  BP: (!) 162/56 (!) 149/62 (!) 145/65 (!) 143/54  Pulse: 94 86 86 83  Resp:  18 18 18 18   Temp: 99.3 F (37.4 C) 99 F (37.2 C) 98.5 F (36.9 C) 98.8 F (37.1 C)  TempSrc: Oral Oral Oral Oral  SpO2:  93% 98% 94%  Weight:   65.9 kg (145 lb 4.8 oz)   Height:        Intake/Output Summary (Last 24 hours) at 07/06/2017 1508 Last data filed at 07/06/2017 0600 Gross per 24 hour  Intake 1075 ml  Output 350 ml  Net 725 ml    Filed Weights   07/04/17 0500 07/05/17 0500 07/06/17 0454  Weight: 70 kg (154 lb 5.2 oz) 67.5 kg (148 lb 13 oz) 65.9 kg (145 lb 4.8 oz)    Examination:  General exam: calm and comfortable. Not on oxygen.  Respiratory system: no wheezing or rhonchi. Air entry fair.  Cardiovascular system:S1S2, NO JVD, no murmer.  Gastrointestinal system: abd is soft non tender non distended bowel sounds heard and good.  Central nervous system: Alert and oriented. No focal neurological deficits. Extremities: Symmetric 5 x 5 power. Skin: No rashes, lesions or ulcers Psychiatry:  Mood & affect appropriate.     Data Reviewed: I have personally reviewed following labs and imaging studies  CBC: Recent Labs  Lab 07/02/17 0309 07/03/17 0255 07/04/17 0252 07/05/17 0319 07/06/17 0411  WBC 23.8* 23.5* 24.4* 21.2* 15.8*  NEUTROABS  --   --   --   --  11.4*  HGB 10.2* 10.5* 11.8* 11.7* 11.0*  HCT 30.2* 31.4* 35.8* 34.4* 33.3*  MCV 84.1 84.9 83.4 84.1 83.5  PLT 361 402* 487* 531* 445*   Basic Metabolic Panel: Recent Labs  Lab 07/01/17 0922 07/02/17 0309 07/03/17 0255 07/04/17 0252 07/05/17 0319  NA 150* 146* 147* 137 137  K 3.9 3.0* 3.6 4.2 5.0  CL 114* 105 103 96* 100*  CO2 24 29 32 32 29  GLUCOSE 154* 162* 147* 98 93  BUN 40* 35* 41* 28* 19  CREATININE 0.58 0.59 0.47 0.57 0.59  CALCIUM 8.9 8.3* 8.6* 8.4* 8.3*  MG  --  2.0 2.0 1.9 2.2  PHOS  --  2.8 3.1 3.8 4.0   GFR: Estimated Creatinine Clearance: 45.8 mL/min (by C-G formula based on SCr of 0.59 mg/dL). Liver Function Tests: No results for input(s): AST, ALT, ALKPHOS, BILITOT, PROT, ALBUMIN in the last 168 hours. No results for input(s): LIPASE, AMYLASE in the last 168 hours. No results for input(s): AMMONIA in the last 168 hours. Coagulation Profile: No results for input(s): INR, PROTIME in the last 168 hours. Cardiac Enzymes: No results for input(s): CKTOTAL, CKMB, CKMBINDEX, TROPONINI in the last 168 hours. BNP (last 3  results) No results for input(s): PROBNP in the last 8760 hours. HbA1C: No results for input(s): HGBA1C in the last 72 hours. CBG: Recent Labs  Lab 07/05/17 2035 07/06/17 0009 07/06/17 0431 07/06/17 0751 07/06/17 1205  GLUCAP 128* 87 89 90 103*   Lipid Profile: Recent Labs    07/04/17 1150  CHOL 196   Thyroid Function Tests: No results for input(s): TSH, T4TOTAL, FREET4, T3FREE, THYROIDAB in the last 72 hours. Anemia Panel: No results for input(s): VITAMINB12, FOLATE, FERRITIN, TIBC, IRON, RETICCTPCT in the last 72 hours. Sepsis Labs: Recent Labs  Lab 07/03/17 0255 07/04/17 0252 07/05/17 0319 07/06/17 0414  PROCALCITON 1.51 1.10 0.67 0.42    Recent Results (from the past 240 hour(s))  Respiratory Panel by PCR     Status: None   Collection Time: 06/27/17  2:26 PM  Result Value Ref  Range Status   Adenovirus NOT DETECTED NOT DETECTED Final   Coronavirus 229E NOT DETECTED NOT DETECTED Final   Coronavirus HKU1 NOT DETECTED NOT DETECTED Final   Coronavirus NL63 NOT DETECTED NOT DETECTED Final   Coronavirus OC43 NOT DETECTED NOT DETECTED Final   Metapneumovirus NOT DETECTED NOT DETECTED Final   Rhinovirus / Enterovirus NOT DETECTED NOT DETECTED Final   Influenza A NOT DETECTED NOT DETECTED Final   Influenza B NOT DETECTED NOT DETECTED Final   Parainfluenza Virus 1 NOT DETECTED NOT DETECTED Final   Parainfluenza Virus 2 NOT DETECTED NOT DETECTED Final   Parainfluenza Virus 3 NOT DETECTED NOT DETECTED Final   Parainfluenza Virus 4 NOT DETECTED NOT DETECTED Final   Respiratory Syncytial Virus NOT DETECTED NOT DETECTED Final   Bordetella pertussis NOT DETECTED NOT DETECTED Final   Chlamydophila pneumoniae NOT DETECTED NOT DETECTED Final   Mycoplasma pneumoniae NOT DETECTED NOT DETECTED Final    Comment: Performed at [facility], [address], [place], [place] [address]  MRSA PCR Screening     Status: None   Collection Time: 06/27/17  2:26 PM  Result Value Ref  Range Status   MRSA by PCR NEGATIVE NEGATIVE Final    Comment:        The GeneXpert MRSA Assay (FDA approved for NASAL specimens only), is one component of a comprehensive MRSA colonization surveillance program. It is not intended to diagnose MRSA infection nor to guide or monitor treatment for MRSA infections. Performed at [facility], [address], [place], [place] [address]   Body fluid culture     Status: None (Preliminary result)   Collection Time: 07/04/17 11:43 AM  Result Value Ref Range Status   Specimen Description   Final    PLEURAL Performed at [facility], [address] [place], [place] [address]    Special Requests   Final    NONE Performed at [facility], [address] [place], [place] [address]    Gram Stain   Final    FEW WBC PRESENT,BOTH PMN AND MONONUCLEAR NO ORGANISMS SEEN    Culture   Final    NO GROWTH 2 DAYS Performed at [facility], [address] [place], [place] [address]    Report Status PENDING  Incomplete         Radiology Studies: No results found.      Scheduled Meds: . hydrALAZINE  50 mg Oral Q8H  . insulin aspart  0-15 Units Subcutaneous Q4H  . latanoprost  1 drop Both Eyes QHS  . lidocaine  2 patch Transdermal Q24H  . mouth rinse  15 mL Mouth Rinse BID  . Melatonin  5 mg Oral QHS  . metoprolol tartrate  25 mg Oral BID  . multivitamin  15 mL Oral Daily  . nystatin   Topical TID  . pantoprazole  40 mg Oral Q0600  . predniSONE  2.5 mg Oral Q breakfast  . ranitidine  150 mg Oral Daily   Continuous Infusions:    LOS: 11 days    Time spent: 35 minutes    Kathlen Mody, MD [facility] Pager [phone] If 7PM-7AM, please contact night-coverage [email or web address] Password [facility] 07/06/2017, 3:08 PM

## 2017-07-07 LAB — BASIC METABOLIC PANEL
Anion gap: 9 (ref 5–15)
BUN: 17 mg/dL (ref 6–20)
CHLORIDE: 101 mmol/L (ref 101–111)
CO2: 25 mmol/L (ref 22–32)
Calcium: 8.2 mg/dL — ABNORMAL LOW (ref 8.9–10.3)
Creatinine, Ser: 0.67 mg/dL (ref 0.44–1.00)
GFR calc non Af Amer: >60 mL/min (ref >60)
Glucose, Bld: 130 mg/dL — ABNORMAL HIGH (ref 65–99)
POTASSIUM: 3.6 mmol/L (ref 3.5–5.1)
SODIUM: 135 mmol/L (ref 135–145)

## 2017-07-07 LAB — CHOLESTEROL, BODY FLUID: CHOL FL: 36 mg/dL

## 2017-07-07 LAB — BODY FLUID CULTURE: CULTURE: NO GROWTH

## 2017-07-07 LAB — GLUCOSE, CAPILLARY
GLUCOSE-CAPILLARY: 103 mg/dL — AB (ref 65–99)
GLUCOSE-CAPILLARY: 108 mg/dL — AB (ref 65–99)
GLUCOSE-CAPILLARY: 113 mg/dL — AB (ref 65–99)
GLUCOSE-CAPILLARY: 175 mg/dL — AB (ref 65–99)
Glucose-Capillary: 111 mg/dL — ABNORMAL HIGH (ref 65–99)
Glucose-Capillary: 115 mg/dL — ABNORMAL HIGH (ref 65–99)
Glucose-Capillary: 94 mg/dL (ref 65–99)

## 2017-07-07 MED ORDER — FAMOTIDINE 20 MG PO TABS
10.0000 mg | ORAL_TABLET | Freq: Every day | ORAL | Status: DC
  Administered 2017-07-07 – 2017-07-09 (×3): 10 mg via ORAL
  Filled 2017-07-07 (×3): qty 1

## 2017-07-07 MED ORDER — METOPROLOL TARTRATE 25 MG PO TABS
25.0000 mg | ORAL_TABLET | Freq: Two times a day (BID) | ORAL | Status: DC
  Administered 2017-07-07 – 2017-07-09 (×5): 25 mg via ORAL
  Filled 2017-07-07 (×5): qty 1

## 2017-07-07 MED ORDER — PREDNISONE 5 MG PO TABS
2.5000 mg | ORAL_TABLET | Freq: Every day | ORAL | Status: DC
  Administered 2017-07-07 – 2017-07-09 (×3): 2.5 mg via ORAL
  Filled 2017-07-07 (×3): qty 1

## 2017-07-07 MED ORDER — ADULT MULTIVITAMIN W/MINERALS CH
1.0000 | ORAL_TABLET | Freq: Every day | ORAL | Status: DC
  Administered 2017-07-07 – 2017-07-09 (×3): 1 via ORAL
  Filled 2017-07-07 (×3): qty 1

## 2017-07-07 MED ORDER — ACETAMINOPHEN 325 MG PO TABS: 650.0000 mg | ORAL_TABLET | ORAL | Status: DC | PRN

## 2017-07-07 MED ORDER — TRAZODONE HCL 50 MG PO TABS
50.0000 mg | ORAL_TABLET | Freq: Once | ORAL | Status: AC
  Administered 2017-07-07: 50 mg via ORAL
  Filled 2017-07-07: qty 1

## 2017-07-07 NOTE — Progress Notes (Signed)
PROGRESS NOTE    Tamara Robertson  LKG:[id number] DOB: 08-09-1929 DOA: 06/25/2017 PCP: Pincus Sanes, MD    Brief Narrative: [name] admitted 2/19 with reports of fever, cough, right lower back pain and concern for pneumonia. PMH CAD, ITP on chronic prednisone (2.5mg  QD).  Found to have strep pneumoniae PNA.  Progressed to respiratory failure, intubated 2/21 and extubated on 2/27.  Patient was transferred to trh  on 2/28. Pt remains afebrile, denies any breathing issues, no cough or sob or chest pain.     Assessment & Plan:   Principal Problem:   Community acquired pneumonia of right lower lobe of lung (HCC) Active Problems:   Severe sepsis with acute organ dysfunction (HCC)   AKI (acute kidney injury) (HCC)   Current chronic use of systemic steroids   Streptococcal pneumonia (HCC)   Acute respiratory failure with hypoxia (HCC)   Pleural effusion on right  Acute respiratory failure secondary to right lower lobe pneumonia .  Completed the course of antibiotic with Rocephin.  ultrasound of the right chest showed right pleural effusion possibly parapneumonic effusion. She underwent thoracentesis and fluid sent for analysis. Wbc count in pleural fluid elevated greater than 1000. Fluid cultures are negative so far.   Patient was intubated on the 21st and extubated on the 27th, and transferred to [facility]  REpeat CXR shows resolution of the pleural effusion. She was also weaned off the oxygen.  PT evaluation recommended SNF placement.    Acute encephalopathy probably secondary to narcotic medication.  Currently she is alert awake and oriented to place and person, following commands. No changes in mental status.  Recommend PT evaluation.   History of chronic thrombocytopenia follows up with Dr. Clelia Croft as an outpatient has a history of ITP from aspirin use. Platelet counts are in 400s at this time. Patient on prednisone  daily   Mild normocytic anemia without any evidence of bleeding continue  to monitor. Hemoglobin is stable around 11.   Patient reports right hip pain with history of bursitis.  Continue with Lidoderm patch, non narcotic pain medication as needed. Hip pain improved. Recommend physical therapy, inturn will need SNF on discharge.    Hypertension Well controlled blood pressure parameters.    Hypernatremia, Hypokalemia  In part due to decreased p.o. intake and free water deficit. Resolved with hydration and potassium replaced.   Leukocytosis partly secondary to community-acquired pneumonia and partly due to prednisone. Improving.   Pro-calcitonin appears to be improving. Wbc is 15,000 today.   DVT prophylaxis: scd's Code Status:  Full code.  Family Communication: none at bedside. Disposition Plan: SNF on discharge.     STUDIES:  2/21 CXR >> progression of RLL infiltrate 2/22 ECHO >> systolic EF 60-65%. Wall motion nml, + diastolic dysfxn but not able to determine grade. Mode AS CT head 2/25: No acute findings   CULTURES: BCx2 2/19 >> neg Sputum 2/19 >>  RVP 2/22 >>neg Urine strep +    ANTIBIOTICS: Rocephin 2/19 >> 3/2 Azithromycin 2/19 >> 2/20   SIGNIFICANT EVENTS: 2/19  Admit with cough, SOB, RLL PNA  2/21  Tx to ICU   LINES/TUBES: Extubated 2/27    Subjective: She denies any new complaints. Looking forward to being discharged when bed available. Her paperwork was faxed today.   Objective: Vitals:   07/06/17 1959 07/07/17 0445 07/07/17 1032 07/07/17 1401  BP: (!) 137/51 (!) 130/43 (!) 147/52 (!) 133/47  Pulse: 92 91 81 81  Resp: 18 18  18  Temp: 98.7 F (37.1 C) 98.2 F (36.8 C)  98.7 F (37.1 C)  TempSrc: Oral Oral  Oral  SpO2: 97% 98%  96%  Weight:      Height:       No intake or output data in the 24 hours ending 07/07/17 1505 Filed Weights   07/04/17 0500 07/05/17 0500 07/06/17 0454  Weight: 70 kg (154 lb 5.2 oz) 67.5 kg (148 lb 13 oz) 65.9 kg (145 lb 4.8 oz)    Examination:  General exam: calm and  comfortable. Not on oxygen.  Respiratory system: no wheezing or rhonchi. Air entry fair. No abn sounds.  Cardiovascular system:S1S2, NO JVD, no murmer. RRR Gastrointestinal system: abd is SOFT , ND NT BS+ Central nervous system: Alert and oriented. Non focal.  Extremities: Symmetric 5 x 5 power. Skin: No rashes, lesions or ulcers Psychiatry:  Mood & affect appropriate.     Data Reviewed: I have personally reviewed following labs and imaging studies  CBC: Recent Labs  Lab 07/02/17 0309 07/03/17 0255 07/04/17 0252 07/05/17 0319 07/06/17 0411  WBC 23.8* 23.5* 24.4* 21.2* 15.8*  NEUTROABS  --   --   --   --  11.4*  HGB 10.2* 10.5* 11.8* 11.7* 11.0*  HCT 30.2* 31.4* 35.8* 34.4* 33.3*  MCV 84.1 84.9 83.4 84.1 83.5  PLT 361 402* 487* 531* 445*   Basic Metabolic Panel: Recent Labs  Lab 07/02/17 0309 07/03/17 0255 07/04/17 0252 07/05/17 0319 07/07/17 0506  NA 146* 147* 137 137 135  K 3.0* 3.6 4.2 5.0 3.6  CL 105 103 96* 100* 101  CO2 29 32 32 29 25  GLUCOSE 162* 147* 98 93 130*  BUN 35* 41* 28* 19 17  CREATININE 0.59 0.47 0.57 0.59 0.67  CALCIUM 8.3* 8.6* 8.4* 8.3* 8.2*  MG 2.0 2.0 1.9 2.2  --   PHOS 2.8 3.1 3.8 4.0  --    GFR: Estimated Creatinine Clearance: 45.8 mL/min (by C-G formula based on SCr of 0.67 mg/dL). Liver Function Tests: No results for input(s): AST, ALT, ALKPHOS, BILITOT, PROT, ALBUMIN in the last 168 hours. No results for input(s): LIPASE, AMYLASE in the last 168 hours. No results for input(s): AMMONIA in the last 168 hours. Coagulation Profile: No results for input(s): INR, PROTIME in the last 168 hours. Cardiac Enzymes: No results for input(s): CKTOTAL, CKMB, CKMBINDEX, TROPONINI in the last 168 hours. BNP (last 3 results) No results for input(s): PROBNP in the last 8760 hours. HbA1C: No results for input(s): HGBA1C in the last 72 hours. CBG: Recent Labs  Lab 07/06/17 1953 07/07/17 0021 07/07/17 0439 07/07/17 0803 07/07/17 1156  GLUCAP  107* 108* 175* 115* 113*   Lipid Profile: No results for input(s): CHOL, HDL, LDLCALC, TRIG, CHOLHDL, LDLDIRECT in the last 72 hours. Thyroid Function Tests: No results for input(s): TSH, T4TOTAL, FREET4, T3FREE, THYROIDAB in the last 72 hours. Anemia Panel: No results for input(s): VITAMINB12, FOLATE, FERRITIN, TIBC, IRON, RETICCTPCT in the last 72 hours. Sepsis Labs: Recent Labs  Lab 07/03/17 0255 07/04/17 0252 07/05/17 0319 07/06/17 0414  PROCALCITON 1.51 1.10 0.67 0.42    Recent Results (from the past 240 hour(s))  Body fluid culture     Status: None   Collection Time: 07/04/17 11:43 AM  Result Value Ref Range Status   Specimen Description   Final    PLEURAL Performed at [facility], [address] [place], [place] [address]    Special Requests   Final  NONE Performed at [facility], [address] [place], [place] [address]    Gram Stain   Final    FEW WBC PRESENT,BOTH PMN AND MONONUCLEAR NO ORGANISMS SEEN    Culture   Final    NO GROWTH 3 DAYS Performed at [facility], [address] [place], [place] [address]    Report Status 07/07/2017 FINAL  Final         Radiology Studies: No results found.      Scheduled Meds: . famotidine  10 mg Oral Daily  . hydrALAZINE  50 mg Oral Q8H  . insulin aspart  0-15 Units Subcutaneous Q4H  . latanoprost  1 drop Both Eyes QHS  . lidocaine  2 patch Transdermal Q24H  . mouth rinse  15 mL Mouth Rinse BID  . Melatonin  5 mg Oral QHS  . metoprolol tartrate  25 mg Oral BID  . multivitamin with minerals  1 tablet Oral Daily  . nystatin   Topical TID  . pantoprazole  40 mg Oral Q0600  . predniSONE  2.5 mg Oral Q breakfast   Continuous Infusions:    LOS: 12 days    Time spent: 35 minutes    Kathlen Mody, MD [facility] Pager [phone] If 7PM-7AM, please contact night-coverage [email or web address] Password [email or web address] 07/07/2017, 3:05 PM

## 2017-07-07 NOTE — Progress Notes (Signed)
CSW informed by RN that family has brought in [employer]- copy placed on chart  CSW faxed referral back out to SNFs with updated insurance information  Jorge Ny, [place] Social Worker [phone]

## 2017-07-08 LAB — GLUCOSE, CAPILLARY
GLUCOSE-CAPILLARY: 95 mg/dL (ref 65–99)
GLUCOSE-CAPILLARY: 90 mg/dL (ref 65–99)
Glucose-Capillary: 103 mg/dL — ABNORMAL HIGH (ref 65–99)
Glucose-Capillary: 114 mg/dL — ABNORMAL HIGH (ref 65–99)
Glucose-Capillary: 89 mg/dL (ref 65–99)
Glucose-Capillary: 99 mg/dL (ref 65–99)

## 2017-07-08 MED ORDER — SENNOSIDES-DOCUSATE SODIUM 8.6-50 MG PO TABS
2.0000 | ORAL_TABLET | Freq: Two times a day (BID) | ORAL | Status: DC
  Administered 2017-07-08 – 2017-07-09 (×2): 2 via ORAL
  Filled 2017-07-08 (×3): qty 2

## 2017-07-08 MED ORDER — ENSURE ENLIVE PO LIQD
237.0000 mL | Freq: Two times a day (BID) | ORAL | Status: DC
  Administered 2017-07-08 – 2017-07-09 (×2): 237 mL via ORAL

## 2017-07-08 MED ORDER — MORPHINE SULFATE (PF) 4 MG/ML IV SOLN: 1.0000 mg | INTRAVENOUS | Status: DC | PRN

## 2017-07-08 MED ORDER — TRAMADOL HCL 50 MG PO TABS
50.0000 mg | ORAL_TABLET | Freq: Four times a day (QID) | ORAL | Status: DC | PRN
  Administered 2017-07-08 (×2): 50 mg via ORAL
  Filled 2017-07-08 (×2): qty 1

## 2017-07-08 NOTE — Progress Notes (Signed)
PROGRESS NOTE    Tamara Robertson  WJX:[id number] DOB: 09/17/1929 DOA: 06/25/2017 PCP: Pincus Sanes, MD    Brief Narrative: [name] admitted 2/19 with reports of fever, cough, right lower back pain and concern for pneumonia. PMH CAD, ITP on chronic prednisone (2.5mg  QD).  Found to have strep pneumoniae PNA.  Progressed to respiratory failure, intubated 2/21 and extubated on 2/27.  Patient was transferred to trh  on 2/28. Pt remains afebrile, denies any breathing issues, no cough or sob or chest pain.     Assessment & Plan:   Principal Problem:   Community acquired pneumonia of right lower lobe of lung (HCC) Active Problems:   Severe sepsis with acute organ dysfunction (HCC)   AKI (acute kidney injury) (HCC)   Current chronic use of systemic steroids   Streptococcal pneumonia (HCC)   Acute respiratory failure with hypoxia (HCC)   Pleural effusion on right  Acute respiratory failure secondary to right lower lobe pneumonia .  Completed the course of antibiotic with Rocephin.  ultrasound of the right chest showed right pleural effusion possibly parapneumonic effusion. She underwent thoracentesis and fluid sent for analysis. Wbc count in pleural fluid elevated greater than 1000. Fluid cultures are negative so far.   Patient was intubated on the 21st and extubated on the 27th, and transferred to [facility]  REpeat CXR shows resolution of the pleural effusion. She was also weaned off the oxygen.  PT evaluation recommended SNF placement.    Acute encephalopathy probably secondary to narcotic medication.  Currently she is alert awake and oriented to place and person, following commands and asking questions. She appears to be at baseline. No changes in mental status.     History of chronic thrombocytopenia follows up with Dr. Clelia Croft as an outpatient has a history of ITP from aspirin use. Platelet counts are in 400s at this time. Patient on prednisone  daily   Mild normocytic anemia without any  evidence of bleeding continue to monitor. Hemoglobin is stable around 11.   Patient reports right hip pain with history of bursitis.  Continue with Lidoderm patch, non narcotic pain medication as needed. Hip pain improved with tramadol Recommend physical therapy, in turn will need SNF on discharge.    Hypertension Well controlled blood pressure parameters.    Hypernatremia, Hypokalemia  In part due to decreased p.o. intake and free water deficit. Resolved with hydration and potassium replaced.   Leukocytosis partly secondary to community-acquired pneumonia and partly due to prednisone. Improving.   Pro-calcitonin appears to be improving. Wbc is 15,000 today.   Constipation:  On miralax, added senna and colace.   DVT prophylaxis: scd's Code Status:  Full code.  Family Communication: none at bedside. Disposition Plan: SNF on discharge.     STUDIES:  2/21 CXR >> progression of RLL infiltrate 2/22 ECHO >> systolic EF 60-65%. Wall motion nml, + diastolic dysfxn but not able to determine grade. Mode AS CT head 2/25: No acute findings   CULTURES: BCx2 2/19 >> neg Sputum 2/19 >>  RVP 2/22 >>neg Urine strep +    ANTIBIOTICS: Rocephin 2/19 >> 3/2 Azithromycin 2/19 >> 2/20   SIGNIFICANT EVENTS: 2/19  Admit with cough, SOB, RLL PNA  2/21  Tx to ICU   LINES/TUBES: Extubated 2/27    Subjective: She reports some hip discomfort from sitting in the chair for long.  Requesting additional pain medication. Reports only slept for one hour last night.   Objective: Vitals:   07/07/17 2208  07/08/17 0332 07/08/17 0450 07/08/17 1219  BP: (!) 146/58  (!) 143/51 134/80  Pulse: 94  91 89  Resp: 18  17   Temp: 98.5 F (36.9 C)  98.7 F (37.1 C)   TempSrc: Oral  Oral   SpO2: 96%  96%   Weight:  62.7 kg (138 lb 3.2 oz)    Height:        Intake/Output Summary (Last 24 hours) at 07/08/2017 1420 Last data filed at 07/08/2017 2831 Gross per 24 hour  Intake 740 ml  Output 750  ml  Net -10 ml   Filed Weights   07/05/17 0500 07/06/17 0454 07/08/17 0332  Weight: 67.5 kg (148 lb 13 oz) 65.9 kg (145 lb 4.8 oz) 62.7 kg (138 lb 3.2 oz)    Examination:  General exam: calm and comfortable. Not on oxygen. Not in distress.  Respiratory system: good air entry bilateral. No wheezing or rhonchi.  Cardiovascular system:S1S2, NO JVD, no murmer. RRR Gastrointestinal system: abd is soft non tender non distended bowel sounds heard.  Central nervous system: Alert and oriented. Non focal.  Extremities: Symmetric 5 x 5 power. Skin: No rashes, lesions or ulcers Psychiatry:  Mood & affect appropriate.     Data Reviewed: I have personally reviewed following labs and imaging studies  CBC: Recent Labs  Lab 07/02/17 0309 07/03/17 0255 07/04/17 0252 07/05/17 0319 07/06/17 0411  WBC 23.8* 23.5* 24.4* 21.2* 15.8*  NEUTROABS  --   --   --   --  11.4*  HGB 10.2* 10.5* 11.8* 11.7* 11.0*  HCT 30.2* 31.4* 35.8* 34.4* 33.3*  MCV 84.1 84.9 83.4 84.1 83.5  PLT 361 402* 487* 531* 445*   Basic Metabolic Panel: Recent Labs  Lab 07/02/17 0309 07/03/17 0255 07/04/17 0252 07/05/17 0319 07/07/17 0506  NA 146* 147* 137 137 135  K 3.0* 3.6 4.2 5.0 3.6  CL 105 103 96* 100* 101  CO2 29 32 32 29 25  GLUCOSE 162* 147* 98 93 130*  BUN 35* 41* 28* 19 17  CREATININE 0.59 0.47 0.57 0.59 0.67  CALCIUM 8.3* 8.6* 8.4* 8.3* 8.2*  MG 2.0 2.0 1.9 2.2  --   PHOS 2.8 3.1 3.8 4.0  --    GFR: Estimated Creatinine Clearance: 44.7 mL/min (by C-G formula based on SCr of 0.67 mg/dL). Liver Function Tests: No results for input(s): AST, ALT, ALKPHOS, BILITOT, PROT, ALBUMIN in the last 168 hours. No results for input(s): LIPASE, AMYLASE in the last 168 hours. No results for input(s): AMMONIA in the last 168 hours. Coagulation Profile: No results for input(s): INR, PROTIME in the last 168 hours. Cardiac Enzymes: No results for input(s): CKTOTAL, CKMB, CKMBINDEX, TROPONINI in the last 168  hours. BNP (last 3 results) No results for input(s): PROBNP in the last 8760 hours. HbA1C: No results for input(s): HGBA1C in the last 72 hours. CBG: Recent Labs  Lab 07/07/17 2210 07/07/17 2353 07/08/17 0448 07/08/17 0729 07/08/17 1154  GLUCAP 94 111* 95 90 103*   Lipid Profile: No results for input(s): CHOL, HDL, LDLCALC, TRIG, CHOLHDL, LDLDIRECT in the last 72 hours. Thyroid Function Tests: No results for input(s): TSH, T4TOTAL, FREET4, T3FREE, THYROIDAB in the last 72 hours. Anemia Panel: No results for input(s): VITAMINB12, FOLATE, FERRITIN, TIBC, IRON, RETICCTPCT in the last 72 hours. Sepsis Labs: Recent Labs  Lab 07/03/17 0255 07/04/17 0252 07/05/17 0319 07/06/17 0414  PROCALCITON 1.51 1.10 0.67 0.42    Recent Results (from the past 240 hour(s))  Body fluid  culture     Status: None   Collection Time: 07/04/17 11:43 AM  Result Value Ref Range Status   Specimen Description   Final    PLEURAL Performed at [facility], [address], [place], [place] [address]    Special Requests   Final    NONE Performed at [facility], [address] [place], [place] [address]    Gram Stain   Final    FEW WBC PRESENT,BOTH PMN AND MONONUCLEAR NO ORGANISMS SEEN    Culture   Final    NO GROWTH 3 DAYS Performed at [facility], [address] [place], [place] [address]    Report Status 07/07/2017 FINAL  Final         Radiology Studies: No results found.      Scheduled Meds: . famotidine  10 mg Oral Daily  . feeding supplement (ENSURE ENLIVE)  237 mL Oral BID BM  . hydrALAZINE  50 mg Oral Q8H  . insulin aspart  0-15 Units Subcutaneous Q4H  . latanoprost  1 drop Both Eyes QHS  . lidocaine  2 patch Transdermal Q24H  . mouth rinse  15 mL Mouth Rinse BID  . Melatonin  5 mg Oral QHS  . metoprolol tartrate  25 mg Oral BID  . multivitamin with minerals  1 tablet Oral Daily  . nystatin   Topical TID  . pantoprazole   40 mg Oral Q0600  . predniSONE  2.5 mg Oral Q breakfast   Continuous Infusions:    LOS: 13 days    Time spent: 35 minutes    Kathlen Mody, MD [facility] Pager [phone] If 7PM-7AM, please contact night-coverage [email or web address] Password [email or web address] 07/08/2017, 2:20 PM

## 2017-07-08 NOTE — Progress Notes (Signed)
Physical Therapy Treatment Patient Details Name: Tamara Robertson MRN: [id number] DOB: 07/30/1929 Today's Date: 07/08/2017    History of Present Illness [name] admitted 2/19 with reports of fever, cough, right lower back pain and concern for pneumonia. PMH CAD, ITP on chronic prednisone (2.5mg  QD).  Found to have strep pneumoniae PNA.  Progressed to respiratory failure, intubated 2/21.      PT Comments    Pt feeling better but still limited and requires assist due to unsteady gait.  Pt lives home alone and will need ST Rehab at SNF prior to returning.   Follow Up Recommendations  SNF;Supervision/Assistance - 24 hour     Equipment Recommendations       Recommendations for Other Services       Precautions / Restrictions Precautions Precautions: Fall Restrictions Weight Bearing Restrictions: No    Mobility  Bed Mobility               General bed mobility comments: OOB in recliner  Transfers Overall transfer level: Needs assistance Equipment used: Rolling walker (2 wheeled) Transfers: Sit to/from Stand Sit to Stand: Mod assist;Min assist Stand pivot transfers: Mod assist;Min assist       General transfer comment: 25% VC's on proper tech and direction to decrease anxiety  Ambulation/Gait Ambulation/Gait assistance: Mod assist;Min assist Ambulation Distance (Feet): 18 Feet Assistive device: Rolling walker (2 wheeled) Gait Pattern/deviations: Step-to pattern;Decreased step length - right;Decreased step length - left Gait velocity: decreased   General Gait Details: limited amb distance due to weakness and dyspnea.  RA avg 94% and HR 102  Required extended rest break after.     Stairs            Wheelchair Mobility    Modified Rankin (Stroke Patients Only)       Balance                                            Cognition Arousal/Alertness: Awake/alert Behavior During Therapy: Anxious                                           Exercises      General Comments        Pertinent Vitals/Pain Pain Assessment: No/denies pain    Home Living                      Prior Function            PT Goals (current goals can now be found in the care plan section) Progress towards PT goals: Progressing toward goals    Frequency    Min 2X/week      PT Plan Current plan remains appropriate    Co-evaluation              AM-PAC PT "6 Clicks" Daily Activity  Outcome Measure  Difficulty turning over in bed (including adjusting bedclothes, sheets and blankets)?: A Lot Difficulty moving from lying on back to sitting on the side of the bed? : A Lot Difficulty sitting down on and standing up from a chair with arms (e.g., wheelchair, bedside commode, etc,.)?: A Lot Help needed moving to and from a bed to chair (including a wheelchair)?: A Lot Help needed walking in hospital  room?: A Lot Help needed climbing 3-5 steps with a railing? : A Lot 6 Click Score: 12    End of Session Equipment Utilized During Treatment: Gait belt Activity Tolerance: Patient limited by fatigue Patient left: in chair;with call bell/phone within reach Nurse Communication: Mobility status PT Visit Diagnosis: Muscle weakness (generalized) (M62.81);Difficulty in walking, not elsewhere classified (R26.2)     Time: 1545-1610 PT Time Calculation (min) (ACUTE ONLY): 25 min  Charges:  $Gait Training: 8-22 mins $Therapeutic Activity: 8-22 mins                    G Codes:       Felecia Shelling  PTA [facility]  Acute  Rehab Pager      [phone]

## 2017-07-08 NOTE — Progress Notes (Signed)
Nutrition Follow-up  DOCUMENTATION CODES:   Not applicable  INTERVENTION:   Ensure Enlive (chocolate) po BID, each supplement provides 350 kcal and 20 grams of protein   Per chart, pt without BM for 7 days, recommend more aggressive bowel regimen  NUTRITION DIAGNOSIS:   Inadequate oral intake related to poor appetite as evidenced by per patient/family report.  Ongoing   GOAL:   Patient will meet greater than or equal to 90% of their needs  Progressing   MONITOR:   PO intake, Supplement acceptance, Weight trends, I & O's  ASSESSMENT:   [name] admitted 2/19 with reports of fever, cough, right lower back pain and concern for pneumonia. PMH CAD, ITP on chronic prednisone (2.5mg  QD).  Found to have strep pneumoniae PNA.  Progressed to respiratory failure, intubated 2/21.   Pt extubated 2/27. Pt s/p thoracentesis 2/28, removal of 550 mL. Pt reports a UBW of 140 lbs and no significant recent weight changes.  Chart reveals pt consuming only 0-10% of meals at this time. Pt reports she consumes 3-5 meals per day at home with a good appetite, however, dislikes food here.  Pt's weight trending down since last RD visit, likely r/t fluid status changes.   Labs reviewed; CBG 90-175 Medications reviewed; Pepcid, sliding scale insulin, Melatonin, multivitamin, Protonix, Prednisone  Diet Order:  Diet regular Room service appropriate? Yes; Fluid consistency: Thin  EDUCATION NEEDS:   No education needs have been identified at this time  Skin:  Skin Assessment: Reviewed RN Assessment  Last BM:  2/25  Height:   Ht Readings from Last 1 Encounters:  06/29/17 5\' 2"  (1.575 m)   Weight:   Wt Readings from Last 1 Encounters:  07/08/17 138 lb 3.2 oz (62.7 kg)   Ideal Body Weight:  50 kg  BMI:  Body mass index is 25.28 kg/m.  Estimated Nutritional Needs:   Kcal:  1600-1800  Protein:  79-99 grams (1.2-1.5 grams/kg)  Fluid:  >/= 1.5 L/day  Parks Ranger, MS, RDN,  LDN 07/08/2017 12:41 PM

## 2017-07-08 NOTE — Progress Notes (Signed)
CSW following to assist with disposition- pursuing SNF for ST rehab at [place]. Pt's initial SNF referrals were made with outdated insurance information- pt has [employer] coverage as of 07/05/17 (previous insurance was [facility] Medicare)- affects SNF bed offers. Today- discussed with pt's daughter the facility options given new insurance information- she selected [facility].  CSW confirmed with facility who began insurance authorization- will need updated PT note for insurance request- Will send once available. CSW explained to daughter about insurance requiring pre-authorization for SNF admission. Daughter understanding. Was not able to say whether private pay would be option should insurance authorization not be granted - will follow up.  Plan: [facility] at [place]. Barriers: [employer], authorization.   Sharren Bridge, MSW, LCSW Clinical Social Work 07/08/2017 [phone]

## 2017-07-08 NOTE — Care Management Important Message (Signed)
Important Message  Patient Details  Name: Tamara Robertson MRN: [id number] Date of Birth: 1929/11/26   Medicare Important Message Given:  Yes    Kerin Salen 07/08/2017, 11:30 AMImportant Message  Patient Details  Name: Tamara Robertson MRN: [id number] Date of Birth: November 06, 1929   Medicare Important Message Given:  Yes    Kerin Salen 07/08/2017, 11:30 AM

## 2017-07-09 DIAGNOSIS — J90 Pleural effusion, not elsewhere classified: Secondary | ICD-10-CM | POA: Diagnosis not present

## 2017-07-09 DIAGNOSIS — M6281 Muscle weakness (generalized): Secondary | ICD-10-CM | POA: Diagnosis not present

## 2017-07-09 DIAGNOSIS — J189 Pneumonia, unspecified organism: Secondary | ICD-10-CM | POA: Diagnosis not present

## 2017-07-09 DIAGNOSIS — D649 Anemia, unspecified: Secondary | ICD-10-CM | POA: Diagnosis not present

## 2017-07-09 DIAGNOSIS — J181 Lobar pneumonia, unspecified organism: Secondary | ICD-10-CM | POA: Diagnosis not present

## 2017-07-09 DIAGNOSIS — J154 Pneumonia due to other streptococci: Secondary | ICD-10-CM | POA: Diagnosis not present

## 2017-07-09 DIAGNOSIS — R52 Pain, unspecified: Secondary | ICD-10-CM | POA: Diagnosis not present

## 2017-07-09 DIAGNOSIS — D72829 Elevated white blood cell count, unspecified: Secondary | ICD-10-CM | POA: Diagnosis not present

## 2017-07-09 DIAGNOSIS — Z7952 Long term (current) use of systemic steroids: Secondary | ICD-10-CM | POA: Diagnosis not present

## 2017-07-09 DIAGNOSIS — J918 Pleural effusion in other conditions classified elsewhere: Secondary | ICD-10-CM | POA: Diagnosis not present

## 2017-07-09 DIAGNOSIS — J159 Unspecified bacterial pneumonia: Secondary | ICD-10-CM | POA: Diagnosis not present

## 2017-07-09 DIAGNOSIS — K5900 Constipation, unspecified: Secondary | ICD-10-CM | POA: Diagnosis not present

## 2017-07-09 DIAGNOSIS — N179 Acute kidney failure, unspecified: Secondary | ICD-10-CM

## 2017-07-09 DIAGNOSIS — J9601 Acute respiratory failure with hypoxia: Secondary | ICD-10-CM | POA: Diagnosis not present

## 2017-07-09 DIAGNOSIS — Z9889 Other specified postprocedural states: Secondary | ICD-10-CM | POA: Diagnosis not present

## 2017-07-09 DIAGNOSIS — E569 Vitamin deficiency, unspecified: Secondary | ICD-10-CM | POA: Diagnosis not present

## 2017-07-09 DIAGNOSIS — R05 Cough: Secondary | ICD-10-CM | POA: Diagnosis not present

## 2017-07-09 DIAGNOSIS — G9340 Encephalopathy, unspecified: Secondary | ICD-10-CM | POA: Diagnosis not present

## 2017-07-09 DIAGNOSIS — R69 Illness, unspecified: Secondary | ICD-10-CM | POA: Diagnosis not present

## 2017-07-09 DIAGNOSIS — M7070 Other bursitis of hip, unspecified hip: Secondary | ICD-10-CM | POA: Diagnosis not present

## 2017-07-09 DIAGNOSIS — M25552 Pain in left hip: Secondary | ICD-10-CM | POA: Diagnosis not present

## 2017-07-09 DIAGNOSIS — R262 Difficulty in walking, not elsewhere classified: Secondary | ICD-10-CM | POA: Diagnosis not present

## 2017-07-09 DIAGNOSIS — D696 Thrombocytopenia, unspecified: Secondary | ICD-10-CM | POA: Diagnosis not present

## 2017-07-09 DIAGNOSIS — R278 Other lack of coordination: Secondary | ICD-10-CM | POA: Diagnosis not present

## 2017-07-09 DIAGNOSIS — R509 Fever, unspecified: Secondary | ICD-10-CM | POA: Diagnosis not present

## 2017-07-09 DIAGNOSIS — M25551 Pain in right hip: Secondary | ICD-10-CM | POA: Diagnosis not present

## 2017-07-09 DIAGNOSIS — I10 Essential (primary) hypertension: Secondary | ICD-10-CM | POA: Diagnosis not present

## 2017-07-09 DIAGNOSIS — J9600 Acute respiratory failure, unspecified whether with hypoxia or hypercapnia: Secondary | ICD-10-CM | POA: Diagnosis not present

## 2017-07-09 DIAGNOSIS — R6520 Severe sepsis without septic shock: Secondary | ICD-10-CM | POA: Diagnosis not present

## 2017-07-09 DIAGNOSIS — D509 Iron deficiency anemia, unspecified: Secondary | ICD-10-CM | POA: Diagnosis not present

## 2017-07-09 LAB — CBC
HCT: 33.3 % — ABNORMAL LOW (ref 36.0–46.0)
HEMOGLOBIN: 10.9 g/dL — AB (ref 12.0–15.0)
MCH: 27.7 pg (ref 26.0–34.0)
MCHC: 32.7 g/dL (ref 30.0–36.0)
MCV: 84.7 fL (ref 78.0–100.0)
Platelets: 534 10*3/uL — ABNORMAL HIGH (ref 150–400)
RBC: 3.93 MIL/uL (ref 3.87–5.11)
RDW: 15 % (ref 11.5–15.5)
WBC: 9.6 10*3/uL (ref 4.0–10.5)

## 2017-07-09 LAB — GLUCOSE, CAPILLARY
Glucose-Capillary: 90 mg/dL (ref 65–99)
Glucose-Capillary: 134 mg/dL — ABNORMAL HIGH (ref 65–99)
Glucose-Capillary: 112 mg/dL — ABNORMAL HIGH (ref 65–99)

## 2017-07-09 MED ORDER — METOPROLOL TARTRATE 25 MG PO TABS: 25.0000 mg | ORAL_TABLET | Freq: Two times a day (BID) | ORAL | 0 refills | Status: DC

## 2017-07-09 MED ORDER — ENSURE ENLIVE PO LIQD: 237.0000 mL | Freq: Two times a day (BID) | ORAL | 12 refills | Status: DC

## 2017-07-09 MED ORDER — TRAMADOL HCL 50 MG PO TABS: 50.0000 mg | ORAL_TABLET | Freq: Two times a day (BID) | ORAL | 0 refills | Status: DC | PRN

## 2017-07-09 MED ORDER — LIDOCAINE 5 % EX PTCH: 2.0000 | MEDICATED_PATCH | CUTANEOUS | 0 refills | Status: DC

## 2017-07-09 NOTE — Progress Notes (Signed)
CSW contacted by patient and informed that she is no longer interested in dc to [facility]. Patient reported that she prefers to dc to another SNF. CSW explained to patient that her insurance requires prior authorization to cover SNF for rehab and that [facility] has started that process. Patient reported that she is willing to private pay to go to another SNF. CSW explained to patient that some SNFs require 2 weeks or 30 days upfront, patient verbalized understanding. Patient reported that she has been speaking with staff from [facility] SNF about rehab. CSW agreed to follow up with [facility] about bed availability. Patient requested that CSW also check several other SNFs to see if they have availability [facility], [facility] and [facility]).   CSW contacted [facility] and left voicemail for admissions staff member [place]. CSW awaiting return phone call.  CSW contacted [facility] and left voicemail for admissions staff member [place]. CSW awaiting return phone call.  CSW contacted [facility] SN[facility] and spoke with admission staff member Loree Fee, staff reported no bed available.  CSW contacted [facility] SNF and spoke with admissions staff member Claiborne Billings, staff agreed to review patient's referral.   CSW will continue to follow up with patient's desired SNF and provide patient with costs.   CSW will continue to follow and assist with discharge planning.  Abundio Miu, [place] Social Worker [facility] Cell#: [phone]

## 2017-07-09 NOTE — Clinical Social Work Placement (Signed)
Patient received and accepted bed offer at [facility]. Facility aware of patient's discharge and confirmed bed offer. PTAR contacted, patient's family notified. Patient's RN can call report to [phone], packet complete. CSW signing off, no other needs identified at this time.  CLINICAL SOCIAL WORK PLACEMENT  NOTE  Date:  07/09/2017  Patient Details  Name: Tamara Robertson MRN: [id number] Date of Birth: 1930-03-04  Clinical Social Work is seeking post-discharge placement for this patient at the [place] level of care (*CSW will initial, date and re-position this form in  chart as items are completed):  Yes   Patient/family provided with [place] Work Department's list of facilities offering this level of care within the geographic area requested by the patient (or if unable, by the patient's family).  Yes   Patient/family informed of their freedom to choose among providers that offer the needed level of care, that participate in Medicare, Medicaid or managed care program needed by the patient, have an available bed and are willing to accept the patient.  Yes   Patient/family informed of [place]'s ownership interest in [facility] and [facility], as well as of the fact that they are under no obligation to receive care at these facilities.  PASRR submitted to EDS on       PASRR number received on       Existing PASRR number confirmed on       FL2 transmitted to all facilities in geographic area requested by pt/family on 07/05/17     FL2 transmitted to all facilities within larger geographic area on       Patient informed that his/her managed care company has contracts with or will negotiate with certain facilities, including the following:        Yes   Patient/family informed of bed offers received.  Patient chooses bed at [facility]     Physician recommends and patient chooses bed at      Patient to be transferred to [facility] on  07/09/17.  Patient to be transferred to facility by PTAR     Patient family notified on 07/09/17 of transfer.  Name of family member notified:  Ina Homes     PHYSICIAN       Additional Comment:    _______________________________________________ Burnis Medin, LCSW 07/09/2017, 1:48 PM

## 2017-07-09 NOTE — Discharge Summary (Signed)
Physician Discharge Summary  Tamara Robertson:[id number] DOB: 1929-08-04 DOA: 06/25/2017  PCP: Pincus Sanes, MD  Admit date: 06/25/2017 Discharge date: 07/09/2017  Admitted From: Home.  Disposition: [facility]  Recommendations for Outpatient Follow-up:  1. Follow up with PCP in 1-2 weeks 2. Please obtain BMP/CBC in one week  Discharge Condition:STABLE.  CODE STATUS: FULL CODE.  Diet recommendation: Heart Healthy   Brief/Interim Summary: [name] admitted 2/19 with reports of fever, cough, right lower back pain and concern for pneumonia. PMH CAD, ITP on chronic prednisone (2.5mg  QD). Found to have strep pneumoniae PNA. Progressed to respiratory failure, intubated 2/21 and extubated on 2/27.  Patient was transferred to trh  on 2/28. Pt remains afebrile, denies any breathing issues, no cough or sob or chest pain.     Discharge Diagnoses:  Principal Problem:   Community acquired pneumonia of right lower lobe of lung (HCC) Active Problems:   Severe sepsis with acute organ dysfunction (HCC)   AKI (acute kidney injury) (HCC)   Current chronic use of systemic steroids   Streptococcal pneumonia (HCC)   Acute respiratory failure with hypoxia (HCC)   Pleural effusion on right  Acute respiratory failure secondary to right lower lobe pneumonia  Completed the course of antibiotic with Rocephin.  ultrasound of the right chest showed right pleural effusion possibly parapneumonic effusion. She underwent thoracentesis and fluid sent for analysis. Wbc count in pleural fluid elevated greater than 1000. Fluid cultures are negative so far.   Patient was intubated on the 21st and extubated on the 27th, and transferred to [facility]  REpeat CXR shows resolution of the pleural effusion. She was also weaned off the oxygen.  PT evaluation recommended SNF placement.    Acute encephalopathy probably secondary to narcotic medication.  Currently she is alert awake and oriented to place and person, following  commands and asking questions. She appears to be at baseline. No changes in mental status.     History of chronic thrombocytopenia follows up with Dr. Clelia Croft as an outpatient has a history of ITP from aspirin use. Patient on prednisone  daily   Mild normocytic anemia without any evidence of bleeding continue to monitor. Hemoglobin is stable around 11.   Patient reports right hip pain with history of bursitis.  Continue with Lidoderm patch, non narcotic pain medication as needed. Hip pain improved with tramadol Recommend physical therapy, in turn will need SNF on discharge  Hypertension Well controlled blood pressure parameters.    Hypernatremia, Hypokalemia  In part due to decreased p.o. intake and free water deficit. Resolved with hydration and potassium replaced.   Leukocytosis partly secondary to community-acquired pneumonia and partly due to prednisone. Improving.   Pro-calcitonin improving, wbc count normal.   Constipation:  On miralax, added senna and colace.     Discharge Instructions  Discharge Instructions    Diet - low sodium heart healthy   Complete by:  As directed    Discharge instructions   Complete by:  As directed    Please follow up with PCP in one week.     Allergies as of 07/09/2017      Reactions   Aspirin Other (See Comments)   ITP   Sulfa Antibiotics Other (See Comments)   dizziness   Contrast Media [iodinated Diagnostic Agents] Hives   Lasix [furosemide] Rash   Statins Other (See Comments)      Medication List    STOP taking these medications   azithromycin 250 MG tablet  Commonly known as:  ZITHROMAX   cefpodoxime 200 MG tablet Commonly known as:  VANTIN     TAKE these medications   acetaminophen 325 MG tablet Commonly known as:  TYLENOL Take 650 mg by mouth every 6 (six) hours as needed for fever.   Biotin 2500 MCG Caps Take 1 capsule by mouth daily.   CITRACAL CALCIUM+D 600-40-500 MG-MG-UNIT Tb24 Generic drug:   Calcium-Magnesium-Vitamin D Take 1 tablet by mouth daily.   clotrimazole-betamethasone cream Commonly known as:  LOTRISONE Apply 1 application topically 2 (two) times daily as needed. What changed:  reasons to take this   feeding supplement (ENSURE ENLIVE) Liqd Take 237 mLs by mouth 2 (two) times daily between meals.   Flaxseed Misc Take 5 mLs by mouth daily. Take 1 tsp daily   furosemide 20 MG tablet Commonly known as:  LASIX Take 1 tablet (20 mg total) by mouth daily.   latanoprost 0.005 % ophthalmic solution Commonly known as:  XALATAN Place 1 drop into both eyes at bedtime.   lidocaine 5 % Commonly known as:  LIDODERM Place 2 patches onto the skin daily. Remove & Discard patch within 12 hours or as directed by MD   metoprolol tartrate 25 MG tablet Commonly known as:  LOPRESSOR Take 1 tablet (25 mg total) by mouth 2 (two) times daily.   multivitamin tablet Take 1 tablet by mouth daily.   pantoprazole 20 MG tablet Commonly known as:  PROTONIX TAKE 1 TABLET BY MOUTH ONCE DAILY   polyethylene glycol packet Commonly known as:  MIRALAX / GLYCOLAX Take 17 g by mouth daily as needed for moderate constipation.   predniSONE 2.5 MG tablet Commonly known as:  DELTASONE TAKE ONE TABLET BY MOUTH ONCE DAILY   PROBIOTIC FORMULA PO Take 1 tablet by mouth daily.   ranitidine 150 MG tablet Commonly known as:  ZANTAC Take 1 tab by mouth every morning.   traMADol 50 MG tablet Commonly known as:  ULTRAM Take 1 tablet (50 mg total) by mouth every 12 (twelve) hours as needed for moderate pain.       Contact information for follow-up providers    Pincus Sanes, MD. Schedule an appointment as soon as possible for a visit in 1 week(s).   Specialty:  Internal Medicine Contact information: [address] [place] [address] [phone]            Contact information for after-discharge care    Destination    [facility] Follow up.    Service:  Skilled Nursing Contact information: [address] [place] [address] [phone]                 Allergies  Allergen Reactions  . Aspirin Other (See Comments)    ITP  . Sulfa Antibiotics Other (See Comments)    dizziness  . Contrast Media [Iodinated Diagnostic Agents] Hives  . Lasix [Furosemide] Rash  . Statins Other (See Comments)    Consultations:  Pccm.    Procedures/Studies: Dg Chest 2 View  Result Date: 06/25/2017 CLINICAL DATA:  Fever and cough. Left-sided posterior chest pain and fatigue x2 days. EXAM: CHEST  2 VIEW COMPARISON:  None. FINDINGS: Normal heart size with aortic atherosclerosis. No aneurysm. New pulmonary consolidation at the right lung base localizing predominantly within the right lower lobe and possibly portions of the right middle lobe. No significant effusion. No pneumothorax. No acute osseous abnormality. Degenerative changes are seen along the dorsal spine.  Cholecystectomy clips are present in the upper abdomen. IMPRESSION: 1. Pneumonic consolidation at the right lung base localizing predominantly within the right lower lobe and possibly within portions of the right middle lobe. These results will be called to the ordering clinician or representative by the Radiologist Assistant, and communication documented in the PACS or zVision Dashboard. 2. Aortic atherosclerosis. Electronically Signed   By: Tollie Eth M.D.   On: 06/25/2017 16:42   Ct Head Wo Contrast  Result Date: 07/01/2017 CLINICAL DATA:  Possible stroke. EXAM: CT HEAD WITHOUT CONTRAST TECHNIQUE: Contiguous axial images were obtained from the base of the skull through the vertex without intravenous contrast. COMPARISON:  CT scan of March 10, 2015. FINDINGS: Brain: Mild diffuse cortical atrophy is noted. No mass effect or midline shift is noted. Ventricular size is within normal limits. There is no evidence of mass lesion, hemorrhage or acute infarction.  Vascular: No hyperdense vessel or unexpected calcification. Skull: Normal. Negative for fracture or focal lesion. Sinuses/Orbits: Mild right sphenoid sinusitis is noted. Other: None. IMPRESSION: Mild diffuse cortical atrophy. No acute intracranial abnormality seen. Electronically Signed   By: Lupita Raider, M.D.   On: 07/01/2017 20:02   Dg Chest Port 1 View  Result Date: 07/04/2017 CLINICAL DATA:  Status post right thoracentesis. EXAM: PORTABLE CHEST 1 VIEW COMPARISON:  Radiograph of July 04, 2017. FINDINGS: Stable cardiomediastinal silhouette and central pulmonary vascular congestion. No pneumothorax is noted. Stable right basilar opacity is noted concerning for infiltrate or atelectasis. No pleural effusion is noted. Minimal left pleural effusion is noted. IMPRESSION: No pneumothorax status post right-sided thoracentesis. No right pleural effusion is noted. Probable minimal left pleural effusion. Electronically Signed   By: Lupita Raider, M.D.   On: 07/04/2017 13:24   Dg Chest Port 1 View  Result Date: 07/04/2017 CLINICAL DATA:  Pneumonia. EXAM: PORTABLE CHEST 1 VIEW COMPARISON:  07/02/2017. FINDINGS: Patient rotated to the right. Interim extubation and removal of NG tube. Persistent but improving right base infiltrate. Near complete clearing of left base infiltrate. Small residual right pleural effusion. No left pleural effusion noted on today's exam. No pneumothorax. IMPRESSION: 1.  Interim extubation and removal of NG tube. 2. Persistent but improving right base infiltrate. Interim near complete resolution of left base infiltrate. Small residual right pleural effusion. No left pleural effusion noted on today's exam. Electronically Signed   By: Maisie Fus  Register   On: 07/04/2017 07:14   Dg Chest Port 1 View  Result Date: 07/02/2017 CLINICAL DATA:  Respiratory failure. EXAM: PORTABLE CHEST 1 VIEW COMPARISON:  Chest x-ray from yesterday. FINDINGS: Unchanged positioning of the endotracheal and  enteric tubes. Stable cardiomediastinal silhouette. Unchanged layering right greater than left pleural effusions with bibasilar airspace disease. Airspace disease in the right upper lobe appears improved. No pneumothorax. No acute osseous abnormality. IMPRESSION: 1. Improved airspace disease in the right upper lobe. Unchanged layering right greater than left pleural effusions with bibasilar consolidation. Electronically Signed   By: Obie Dredge M.D.   On: 07/02/2017 14:53   Dg Chest Port 1 View  Result Date: 07/01/2017 CLINICAL DATA:  Hypoxia EXAM: PORTABLE CHEST 1 VIEW COMPARISON:  June 29, 2017 FINDINGS: Endotracheal tube tip is 2.8 cm above the carina. Nasogastric tube tip and side port are below the diaphragm. No pneumothorax. There are pleural effusions bilaterally. There is patchy airspace opacification in the right upper and lower lobes, somewhat less than on recent study. There has also been partial clearing of patchy opacity from the left  mid and upper lung zones. Heart size and pulmonary vascularity are normal. No adenopathy. There is no evident bone lesion. IMPRESSION: Tube positions as described without pneumothorax. Partial but incomplete clearing of patchy airspace opacity bilaterally. No new opacity. Stable cardiac silhouette. Electronically Signed   By: Bretta Bang III M.D.   On: 07/01/2017 08:36   Dg Chest Port 1 View  Result Date: 06/29/2017 CLINICAL DATA:  Acute respiratory failure with hypoxia. Ventilator support. EXAM: PORTABLE CHEST 1 VIEW COMPARISON:  06/28/2017 FINDINGS: Endotracheal tube tip is 1 cm above the carina. Nasogastric tube enters the abdomen. Bilateral airspace filling pattern is similar, again worse on the right than the left. Possible small amount of pleural fluid. No worsening or new finding. IMPRESSION: Endotracheal tube and nasogastric tube well positioned. Persistent bilateral airspace filling right worse than left. Electronically Signed   By: Paulina Fusi M.D.   On: 06/29/2017 06:45   Dg Chest Port 1 View  Result Date: 06/28/2017 CLINICAL DATA:  Fever cough and right lower back pain.  Pneumonia. EXAM: PORTABLE CHEST 1 VIEW COMPARISON:  Yesterday FINDINGS: Endotracheal tube tip is 2 mm above the carina. An orogastric tube reaches the stomach. Extensive asymmetric right lung opacity. There is haziness of the lower chest that may be from pleural fluid. No generalized Kerley lines. No pneumothorax. Normal heart size. These results will be called to the ordering clinician or representative by the Radiologist Assistant, and communication documented in the PACS or zVision Dashboard. IMPRESSION: 1. Low endotracheal tube, tip 2 mm above the carina. Consider retraction by ~2 cm. 2. History of pneumonia with unchanged right more than left opacity. Possible layering pleural fluid. Electronically Signed   By: Marnee Spring M.D.   On: 06/28/2017 10:12   Portable Chest X-ray  Result Date: 06/27/2017 CLINICAL DATA:  Endotracheal and orogastric tube placement. EXAM: PORTABLE CHEST 1 VIEW COMPARISON:  06/27/2017 at 1116 hours FINDINGS: An endotracheal tube terminates 1.5 cm above the carina. An enteric tube courses into the left upper abdomen with tip and side hole beyond the inferior margin of the image. The cardiomediastinal silhouette is unchanged. Consolidation is again seen throughout the right mid and lower lung. Patchy right upper lobe airspace opacity may have mildly progressed. Patchy left perihilar/left upper lobe and left basilar airspace opacities are stable to minimally progressed. Small pleural effusions are suspected bilaterally. No pneumothorax is identified. IMPRESSION: 1. Endotracheal tube 1.5 cm above the carina. 2. Enteric tube reaches the stomach with tip not imaged. 3. Stable to slight worsening of right greater than left lung consolidation concerning for pneumonia. Electronically Signed   By: Sebastian Ache M.D.   On: 06/27/2017 17:05   Dg Chest  Port 1 View  Result Date: 06/27/2017 CLINICAL DATA:  Fever with cough and chills EXAM: PORTABLE CHEST 1 VIEW COMPARISON:  June 25, 2017 FINDINGS: There has been progression of airspace consolidation on the right, now with consolidation throughout much of the right mid and lower lung zones and more patchy infiltrate in the right upper lobe. There is also increase in infiltrate in the medial left upper lobe as well as in the medial left base. Heart is upper normal in size with pulmonary vascularity normal. No adenopathy. No evident bone lesions. IMPRESSION: There has been extensive progression of presumed pneumonia on the right with consolidation now throughout the right mid and lower lung zones as well as in the right upper lobe to a lesser extent. There is also increase in consolidation in the  medial left upper lobe as well as new consolidation in the medial left base. Stable cardiac silhouette.  No adenopathy appreciable. Electronically Signed   By: Bretta Bang III M.D.   On: 06/27/2017 11:48   Dg Hip Port Unilat With Pelvis 1v Left  Result Date: 07/04/2017 CLINICAL DATA:  LEFT hip pain. EXAM: DG HIP (WITH OR WITHOUT PELVIS) 1V PORT LEFT COMPARISON:  06/26/2017 FINDINGS: There is no evidence of acute fracture, subluxation or dislocation. No focal bony lesions are present. Mild degenerative changes in the hips noted. IMPRESSION: No evidence of acute abnormality. Mild degenerative changes in the hips. Electronically Signed   By: Harmon Pier M.D.   On: 07/04/2017 13:34   Dg Hip Unilat With Pelvis 2-3 Views Left  Result Date: 06/26/2017 CLINICAL DATA:  Sepsis, anterior hip pain for 2 years.  No injury. EXAM: DG HIP (WITH OR WITHOUT PELVIS) 2-3V LEFT COMPARISON:  None. FINDINGS: There is no evidence of hip fracture or dislocation. There is no evidence of arthropathy or other focal bone abnormality. Phleboliths project in the pelvis. IMPRESSION: Negative. Electronically Signed   By: Awilda Metro  M.D.   On: 06/26/2017 02:57       Subjective: No new complaints. Wants to go [facility].   Discharge Exam: Vitals:   07/08/17 2024 07/09/17 0406  BP: (!) 146/61 139/64  Pulse: 84 85  Resp: 16 16  Temp: 98.4 F (36.9 C) 98.5 F (36.9 C)  SpO2: 98% 96%   Vitals:   07/08/17 1219 07/08/17 1848 07/08/17 2024 07/09/17 0406  BP: 134/80 135/62 (!) 146/61 139/64  Pulse: 89 81 84 85  Resp:   16 16  Temp:   98.4 F (36.9 C) 98.5 F (36.9 C)  TempSrc:   Oral Oral  SpO2:   98% 96%  Weight:    63.4 kg (139 lb 12.4 oz)  Height:        General: Pt is alert, awake, not in acute distress Cardiovascular: RRR, S1/S2 +, no rubs, no gallops Respiratory: CTA bilaterally, no wheezing, no rhonchi Abdominal: Soft, NT, ND, bowel sounds + Extremities: no edema, no cyanosis    The results of significant diagnostics from this hospitalization (including imaging, microbiology, ancillary and laboratory) are listed below for reference.     Microbiology: Recent Results (from the past 240 hour(s))  Body fluid culture     Status: None   Collection Time: 07/04/17 11:43 AM  Result Value Ref Range Status   Specimen Description   Final    PLEURAL Performed at [facility], [address] [place], [place] [address]    Special Requests   Final    NONE Performed at [facility], [address], [place], [place] [address]    Gram Stain   Final    FEW WBC PRESENT,BOTH PMN AND MONONUCLEAR NO ORGANISMS SEEN    Culture   Final    NO GROWTH 3 DAYS Performed at [facility], [address] [place], [place] [address]    Report Status 07/07/2017 FINAL  Final     Labs: BNP (last 3 results) No results for input(s): BNP in the last 8760 hours. Basic Metabolic Panel: Recent Labs  Lab 07/03/17 0255 07/04/17 0252 07/05/17 0319 07/07/17 0506  NA 147* 137 137 135  K 3.6 4.2 5.0 3.6  CL 103 96* 100* 101  CO2 32 32 29 25  GLUCOSE 147* 98 93 130*   BUN 41* 28* 19 17  CREATININE 0.47 0.57 0.59 0.67  CALCIUM 8.6* 8.4* 8.3* 8.2*  MG 2.0 1.9 2.2  --   PHOS 3.1 3.8 4.0  --    Liver Function Tests: No results for input(s): AST, ALT, ALKPHOS, BILITOT, PROT, ALBUMIN in the last 168 hours. No results for input(s): LIPASE, AMYLASE in the last 168 hours. No results for input(s): AMMONIA in the last 168 hours. CBC: Recent Labs  Lab 07/03/17 0255 07/04/17 0252 07/05/17 0319 07/06/17 0411 07/09/17 0446  WBC 23.5* 24.4* 21.2* 15.8* 9.6  NEUTROABS  --   --   --  11.4*  --   HGB 10.5* 11.8* 11.7* 11.0* 10.9*  HCT 31.4* 35.8* 34.4* 33.3* 33.3*  MCV 84.9 83.4 84.1 83.5 84.7  PLT 402* 487* 531* 445* 534*   Cardiac Enzymes: No results for input(s): CKTOTAL, CKMB, CKMBINDEX, TROPONINI in the last 168 hours. BNP: Invalid input(s): POCBNP CBG: Recent Labs  Lab 07/08/17 2017 07/08/17 2341 07/09/17 0401 07/09/17 0759 07/09/17 1133  GLUCAP 114* 89 90 134* 112*   D-Dimer No results for input(s): DDIMER in the last 72 hours. Hgb A1c No results for input(s): HGBA1C in the last 72 hours. Lipid Profile No results for input(s): CHOL, HDL, LDLCALC, TRIG, CHOLHDL, LDLDIRECT in the last 72 hours. Thyroid function studies No results for input(s): TSH, T4TOTAL, T3FREE, THYROIDAB in the last 72 hours.  Invalid input(s): FREET3 Anemia work up No results for input(s): VITAMINB12, FOLATE, FERRITIN, TIBC, IRON, RETICCTPCT in the last 72 hours. Urinalysis    Component Value Date/Time   COLORURINE AMBER (A) 06/25/2017 2351   APPEARANCEUR CLOUDY (A) 06/25/2017 2351   LABSPEC 1.021 06/25/2017 2351   PHURINE 5.0 06/25/2017 2351   GLUCOSEU NEGATIVE 06/25/2017 2351   GLUCOSEU NEGATIVE 04/26/2016 1121   HGBUR NEGATIVE 06/25/2017 2351   BILIRUBINUR NEGATIVE 06/25/2017 2351   KETONESUR 5 (A) 06/25/2017 2351   PROTEINUR 30 (A) 06/25/2017 2351   UROBILINOGEN 0.2 04/26/2016 1121   NITRITE NEGATIVE 06/25/2017 2351   LEUKOCYTESUR NEGATIVE 06/25/2017  2351   Sepsis Labs Invalid input(s): PROCALCITONIN,  WBC,  LACTICIDVEN Microbiology Recent Results (from the past 240 hour(s))  Body fluid culture     Status: None   Collection Time: 07/04/17 11:43 AM  Result Value Ref Range Status   Specimen Description   Final    PLEURAL Performed at [facility], [address] [place], [place] [address]    Special Requests   Final    NONE Performed at [facility], [address] [place], [place] [address]    Gram Stain   Final    FEW WBC PRESENT,BOTH PMN AND MONONUCLEAR NO ORGANISMS SEEN    Culture   Final    NO GROWTH 3 DAYS Performed at [facility], [address] [place], [place] [address]    Report Status 07/07/2017 FINAL  Final     Time coordinating discharge: Over 30 minutes  SIGNED:   Kathlen Mody, MD  [facility] 07/09/2017, 1:09 PM Pager   If 7PM-7AM, please contact night-coverage [email or web address] Password [email or web address]

## 2017-07-10 ENCOUNTER — Telehealth: Payer: Self-pay | Admitting: *Deleted

## 2017-07-10 DIAGNOSIS — G9340 Encephalopathy, unspecified: Secondary | ICD-10-CM | POA: Diagnosis not present

## 2017-07-10 DIAGNOSIS — J159 Unspecified bacterial pneumonia: Secondary | ICD-10-CM | POA: Diagnosis not present

## 2017-07-10 DIAGNOSIS — M25551 Pain in right hip: Secondary | ICD-10-CM | POA: Diagnosis not present

## 2017-07-10 DIAGNOSIS — M7070 Other bursitis of hip, unspecified hip: Secondary | ICD-10-CM | POA: Diagnosis not present

## 2017-07-10 DIAGNOSIS — J918 Pleural effusion in other conditions classified elsewhere: Secondary | ICD-10-CM | POA: Diagnosis not present

## 2017-07-10 DIAGNOSIS — J9601 Acute respiratory failure with hypoxia: Secondary | ICD-10-CM | POA: Diagnosis not present

## 2017-07-10 DIAGNOSIS — D72829 Elevated white blood cell count, unspecified: Secondary | ICD-10-CM | POA: Diagnosis not present

## 2017-07-10 DIAGNOSIS — M6281 Muscle weakness (generalized): Secondary | ICD-10-CM | POA: Diagnosis not present

## 2017-07-10 DIAGNOSIS — R69 Illness, unspecified: Secondary | ICD-10-CM | POA: Diagnosis not present

## 2017-07-10 DIAGNOSIS — D509 Iron deficiency anemia, unspecified: Secondary | ICD-10-CM | POA: Diagnosis not present

## 2017-07-10 DIAGNOSIS — D696 Thrombocytopenia, unspecified: Secondary | ICD-10-CM | POA: Diagnosis not present

## 2017-07-10 NOTE — Telephone Encounter (Signed)
Pt was on [facility]M report admitted 06/25/17 for Cough with fever. X-ray revealed right lower lobe pneumonia. 06/27/17 pt transferred [facility] hospital ICU/Stepdown unit. Pt D/C 07/09/17, and sent to SNF per summary will need to f.u w/PCP after 1 week.Marland KitchenJohny Chess

## 2017-07-12 ENCOUNTER — Telehealth: Payer: Self-pay | Admitting: Internal Medicine

## 2017-07-12 DIAGNOSIS — D696 Thrombocytopenia, unspecified: Secondary | ICD-10-CM | POA: Diagnosis not present

## 2017-07-12 DIAGNOSIS — J159 Unspecified bacterial pneumonia: Secondary | ICD-10-CM | POA: Diagnosis not present

## 2017-07-12 DIAGNOSIS — G9340 Encephalopathy, unspecified: Secondary | ICD-10-CM | POA: Diagnosis not present

## 2017-07-12 DIAGNOSIS — M25551 Pain in right hip: Secondary | ICD-10-CM | POA: Diagnosis not present

## 2017-07-12 DIAGNOSIS — J918 Pleural effusion in other conditions classified elsewhere: Secondary | ICD-10-CM | POA: Diagnosis not present

## 2017-07-12 DIAGNOSIS — J9601 Acute respiratory failure with hypoxia: Secondary | ICD-10-CM | POA: Diagnosis not present

## 2017-07-12 DIAGNOSIS — M7070 Other bursitis of hip, unspecified hip: Secondary | ICD-10-CM | POA: Diagnosis not present

## 2017-07-12 DIAGNOSIS — D72829 Elevated white blood cell count, unspecified: Secondary | ICD-10-CM | POA: Diagnosis not present

## 2017-07-12 DIAGNOSIS — D509 Iron deficiency anemia, unspecified: Secondary | ICD-10-CM | POA: Diagnosis not present

## 2017-07-12 DIAGNOSIS — M6281 Muscle weakness (generalized): Secondary | ICD-10-CM | POA: Diagnosis not present

## 2017-07-12 NOTE — Telephone Encounter (Signed)
Copied from [place] [phone]. Topic: Quick Communication - See Telephone Encounter >> Jul 12, 2017 10:40 AM Ahmed Prima L wrote: CRM for notification. See Telephone encounter for:   07/12/17.  Kenney Houseman from [facility] care needs verbal orders for an evaluation on OT & PT  Call back is [phone]

## 2017-07-12 NOTE — Telephone Encounter (Signed)
Spoke with Lavella Lemons to give verbal orders per MD.

## 2017-07-17 ENCOUNTER — Telehealth: Payer: Self-pay | Admitting: Internal Medicine

## 2017-07-17 DIAGNOSIS — I739 Peripheral vascular disease, unspecified: Secondary | ICD-10-CM | POA: Diagnosis not present

## 2017-07-17 DIAGNOSIS — M1711 Unilateral primary osteoarthritis, right knee: Secondary | ICD-10-CM | POA: Diagnosis not present

## 2017-07-17 DIAGNOSIS — R69 Illness, unspecified: Secondary | ICD-10-CM | POA: Diagnosis not present

## 2017-07-17 DIAGNOSIS — J189 Pneumonia, unspecified organism: Secondary | ICD-10-CM | POA: Diagnosis not present

## 2017-07-17 DIAGNOSIS — Z7952 Long term (current) use of systemic steroids: Secondary | ICD-10-CM | POA: Diagnosis not present

## 2017-07-17 DIAGNOSIS — D693 Immune thrombocytopenic purpura: Secondary | ICD-10-CM | POA: Diagnosis not present

## 2017-07-17 DIAGNOSIS — M545 Low back pain: Secondary | ICD-10-CM | POA: Diagnosis not present

## 2017-07-17 DIAGNOSIS — I2510 Atherosclerotic heart disease of native coronary artery without angina pectoris: Secondary | ICD-10-CM | POA: Diagnosis not present

## 2017-07-17 NOTE — Telephone Encounter (Signed)
Copied from [place] [phone]. Topic: Quick Communication - See Telephone Encounter >> Jul 17, 2017  3:08 PM Percell Belt A wrote: CRM for notification. See Telephone encounter for:  Junie Panning PT with [facility] [phone] [place] for PT strength training  2 week 2   07/17/17.

## 2017-07-17 NOTE — Telephone Encounter (Signed)
LVM giving verbal orders per MD.  

## 2017-07-18 ENCOUNTER — Other Ambulatory Visit: Payer: Self-pay | Admitting: Internal Medicine

## 2017-07-18 DIAGNOSIS — I2510 Atherosclerotic heart disease of native coronary artery without angina pectoris: Secondary | ICD-10-CM | POA: Diagnosis not present

## 2017-07-18 DIAGNOSIS — M1711 Unilateral primary osteoarthritis, right knee: Secondary | ICD-10-CM | POA: Diagnosis not present

## 2017-07-18 DIAGNOSIS — M545 Low back pain: Secondary | ICD-10-CM | POA: Diagnosis not present

## 2017-07-18 DIAGNOSIS — R2689 Other abnormalities of gait and mobility: Secondary | ICD-10-CM

## 2017-07-18 DIAGNOSIS — D693 Immune thrombocytopenic purpura: Secondary | ICD-10-CM | POA: Diagnosis not present

## 2017-07-18 DIAGNOSIS — R5381 Other malaise: Secondary | ICD-10-CM

## 2017-07-18 DIAGNOSIS — J189 Pneumonia, unspecified organism: Secondary | ICD-10-CM | POA: Diagnosis not present

## 2017-07-18 DIAGNOSIS — I739 Peripheral vascular disease, unspecified: Secondary | ICD-10-CM | POA: Diagnosis not present

## 2017-07-18 DIAGNOSIS — Z7952 Long term (current) use of systemic steroids: Secondary | ICD-10-CM | POA: Diagnosis not present

## 2017-07-18 DIAGNOSIS — R69 Illness, unspecified: Secondary | ICD-10-CM | POA: Diagnosis not present

## 2017-07-18 NOTE — Telephone Encounter (Signed)
Copied from [place] [phone]. Topic: Inquiry >> Jul 18, 2017  4:18 PM Oliver Pila B wrote: Reason for CRM: [facility] called for verbals for OT 1wk/2wk contact [place]ovan [phone] Needs Rx for bed side commode and fax the demographic w/ Rx Fax that to [facility] [phone] or contact [phone]

## 2017-07-18 NOTE — Telephone Encounter (Signed)
Copied from [place] [phone]. Topic: Inquiry >> Jul 18, 2017  4:18 PM Oliver Pila B wrote: Reason for CRM: [facility] called for verbals for OT 1wk/2wk contact [place] [phone] Needs Rx for bed side commode and fax the demographic w/ Rx Fax that to [facility] [phone] or contact [phone]

## 2017-07-19 NOTE — Telephone Encounter (Signed)
Okay to give verbal orders for OT.  Bedside commode ordered

## 2017-07-19 NOTE — Telephone Encounter (Signed)
See attached request from [facility] requesting orders.  Pt of Dr. Quay Burow.

## 2017-07-19 NOTE — Telephone Encounter (Signed)
Left message for Tamara Robertson---giving OT orders and advised that bedside commode fax has been sent

## 2017-07-22 DIAGNOSIS — Z7952 Long term (current) use of systemic steroids: Secondary | ICD-10-CM | POA: Diagnosis not present

## 2017-07-22 DIAGNOSIS — I739 Peripheral vascular disease, unspecified: Secondary | ICD-10-CM | POA: Diagnosis not present

## 2017-07-22 DIAGNOSIS — R69 Illness, unspecified: Secondary | ICD-10-CM | POA: Diagnosis not present

## 2017-07-22 DIAGNOSIS — I2510 Atherosclerotic heart disease of native coronary artery without angina pectoris: Secondary | ICD-10-CM | POA: Diagnosis not present

## 2017-07-22 DIAGNOSIS — D693 Immune thrombocytopenic purpura: Secondary | ICD-10-CM | POA: Diagnosis not present

## 2017-07-22 DIAGNOSIS — M1711 Unilateral primary osteoarthritis, right knee: Secondary | ICD-10-CM | POA: Diagnosis not present

## 2017-07-22 DIAGNOSIS — J189 Pneumonia, unspecified organism: Secondary | ICD-10-CM | POA: Diagnosis not present

## 2017-07-22 DIAGNOSIS — M545 Low back pain: Secondary | ICD-10-CM | POA: Diagnosis not present

## 2017-07-22 NOTE — Progress Notes (Signed)
Subjective:    Patient ID: Tamara Robertson, female    DOB: 11/19/1929, 84 y.o.   MRN: [id number]  HPI The patient is here for follow up from the hospital.  Admitted 06/25/17-07/09/17 for pneumonia of right lower lung and severe sepsis.  She was seen here on the day of admission and was advised to go to the emergency room.  She was experiencing a cough, fever and right lower back pain.  She was found to have strep pneumoniae pneumonia in her right lower lung.  She had severe sepsis and progressed to respiratory failure requiring intubation.  She was intubated 2/21 and extubated 2/26.  She was discharged to [facility] skilled nursing facility and is currently at home.  Acute respiratory failure secondary to right lower lobe pneumonia Completed Rocephin Ultrasound of right chest showed right pleural effusion, possible parapneumonic effusion S/p thoracentesis-fluid sent for analysis, WBC count elevated, fluid cultures negative Intubated 2/21 and extubated 2/27 Repeat chest x-ray shows resolution of pleural effusion Weaned off oxygen Has a minimal cough at this point and rarely brings anything up.  She has minimal shortness of breath.  She denies any wheezing.  She has not had any fevers or chills.  Her energy level is slightly low but improved and continues to improve.  Acute encephalopathy probably secondary to narcotic medication Resolved  Chronic thrombocytopenia Follows with Dr. Clelia Croft as an outpatient Continued on prednisone daily She was concerned about her platelet count became quite elevated.  She has not had blood work done since she left the hospital  Mild normocytic anemia No evidence of bleeding Hemoglobin stable around 11   Hypertension Well-controlled with metoprolol She is taking her medication daily was wondering if she still needed it.  Hypernatremia, hypokalemia Related to decreased p.o. intake and free water deficit Resolved with hydration and potassium  replacement  Leukocytosis Probably related to community-acquired pneumonia, partly related to prednisone Improving Pro-calcitonin improving, white blood cell count normal  Constipation MiraLAX, added senna and Colace  Physical deconditioning, right hip bursitis Continued on Lidoderm patch for the bursitis Nonnarcotic pain medication as needed Hip pain did improve with tramadol Currently she denies any hip pain Physical therapy/ OT at home ordered for physical deconditioning Using a walker at home, sometimes uses cane Doing very well at home and most likely physical therapy will be complete soon    Medications and allergies reviewed with patient and updated if appropriate.  Patient Active Problem List   Diagnosis Date Noted  . Pleural effusion on right   . Acute respiratory failure with hypoxia (HCC)   . Streptococcal pneumonia (HCC) 06/26/2017  . Dehydration 06/25/2017  . Community acquired pneumonia of right lower lobe of lung (HCC) 06/25/2017  . Fever 06/25/2017  . Cough 06/25/2017  . Severe sepsis with acute organ dysfunction (HCC) 06/25/2017  . AKI (acute kidney injury) (HCC) 06/25/2017  . Current chronic use of systemic steroids 06/25/2017  . Chronic lower back pain 05/14/2017  . Varicose veins of left lower extremity with complications 03/05/2017  . Bilateral hearing loss 02/01/2017  . Varicose veins of both lower extremities 09/20/2016  . Hypertension 07/31/2016  . Leg edema 07/31/2016  . Prediabetes 06/05/2016  . Osteopenia 12/01/2015  . Anxiety 07/19/2015  . Degenerative cervical disc 12/28/2014  . Left ovarian cyst   . Peripheral neuropathy 06/03/2012  . Insomnia   . PVD (peripheral vascular disease) (HCC) 11/23/2010  . Bursitis of hip 10/26/2010  . Hyperlipidemia 06/27/2010  . IMMUNE THROMBOCYTOPENIC PURPURA  06/27/2010  . GLUCOMA 06/27/2010  . GERD 06/27/2010  . IRRITABLE BOWEL SYNDROME 06/27/2010  . OVERACTIVE BLADDER 06/27/2010  . OSTEOARTHRITIS,  KNEE, RIGHT 06/27/2010  . URINARY INCONTINENCE 06/27/2010    Current Outpatient Medications on File Prior to Visit  Medication Sig Dispense Refill  . acetaminophen (TYLENOL) 325 MG tablet Take 650 mg by mouth every 6 (six) hours as needed for fever.    . Biotin 2500 MCG CAPS Take 1 capsule by mouth daily.     . Calcium-Magnesium-Vitamin D (CITRACAL CALCIUM+D) 600-40-500 MG-MG-UNIT TB24 Take 1 tablet by mouth daily.     . feeding supplement, ENSURE ENLIVE, (ENSURE ENLIVE) LIQD Take 237 mLs by mouth 2 (two) times daily between meals. 237 mL 12  . Flaxseed MISC Take 5 mLs by mouth daily. Take 1 tsp daily    . furosemide (LASIX) 20 MG tablet Take 1 tablet (20 mg total) by mouth daily. 90 tablet 3  . latanoprost (XALATAN) 0.005 % ophthalmic solution Place 1 drop into both eyes at bedtime.    . lidocaine (LIDODERM) 5 % Place 2 patches onto the skin daily. Remove & Discard patch within 12 hours or as directed by MD 30 patch 0  . metoprolol tartrate (LOPRESSOR) 25 MG tablet Take 1 tablet (25 mg total) by mouth 2 (two) times daily. 60 tablet 0  . Multiple Vitamin (MULTIVITAMIN) tablet Take 1 tablet by mouth daily.      . pantoprazole (PROTONIX) 20 MG tablet TAKE 1 TABLET BY MOUTH ONCE DAILY 90 tablet 2  . polyethylene glycol (MIRALAX / GLYCOLAX) packet Take 17 g by mouth daily as needed for moderate constipation.     . predniSONE (DELTASONE) 2.5 MG tablet TAKE ONE TABLET BY MOUTH ONCE DAILY 90 tablet 3  . Probiotic Product (PROBIOTIC FORMULA PO) Take 1 tablet by mouth daily.      . ranitidine (ZANTAC) 150 MG tablet Take 1 tab by mouth every morning. 30 tablet 6  . traMADol (ULTRAM) 50 MG tablet Take 1 tablet (50 mg total) by mouth every 12 (twelve) hours as needed for moderate pain. 6 tablet 0   No current facility-administered medications on file prior to visit.     Past Medical History:  Diagnosis Date  . Anxiety   . CAD (coronary artery disease)    RCA 40% stenosis cath 01/2011  .  Cholelithiasis    s/p lap chole 09/2014  . Diverticular stricture [facility]) 2006   [facility]  . DIVERTICULITIS, HX OF   . Diverticulosis   . DYSLIPIDEMIA   . Elevated LFTs   . GERD   . Glaucoma   . Hepatic steatosis   . HOH (hard of hearing)   . Immune thrombocytopenic purpura (HCC)    chronic - baseline 80-100K, on pred  . Irritable bowel syndrome   . Left ovarian cyst dx 01/2013 CT   working with gyn, ?malignant - elevated tumor marker OVA1  . OSTEOARTHRITIS, KNEE, RIGHT   . OSTEOPENIA   . OVERACTIVE BLADDER   . UNSPECIFIED PERIPHERAL VASCULAR DISEASE   . URINARY INCONTINENCE     Past Surgical History:  Procedure Laterality Date  . ABDOMINAL HYSTERECTOMY  1963  . ANGIOPLASTY    . APPENDECTOMY  1956  . CARDIAC CATHETERIZATION    . CATARACT EXTRACTION, BILATERAL  10/2010  . CHOLECYSTECTOMY N/A 09/16/2014   Procedure: LAPAROSCOPIC CHOLECYSTECTOMY ;  Surgeon: Emelia Loron, MD;  Location: [facility] OR;  Service: General;  Laterality: N/A;  . KNEE ARTHROSCOPY  Right   . L pop PTA  10/2009   stent  . LAPAROSCOPIC SIGMOID COLECTOMY  10/2005  . SPLENECTOMY  1954  . VARICOSE VEIN SURGERY Right 1962    Social History   Socioeconomic History  . Marital status: Widowed    Spouse name: None  . Number of children: None  . Years of education: None  . Highest education level: None  Social Needs  . Financial resource strain: None  . Food insecurity - worry: None  . Food insecurity - inability: None  . Transportation needs - medical: None  . Transportation needs - non-medical: None  Occupational History  . Occupation: Retired    Comment: Programmer, multimedia  Tobacco Use  . Smoking status: Never Smoker  . Smokeless tobacco: Never Used  Substance and Sexual Activity  . Alcohol use: No    Alcohol/week: 0.0 oz    Comment: rarely  . Drug use: No  . Sexual activity: None  Other Topics Concern  . 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    [place] to [place] from [place] 05/2010 to be close to kids    Family History  Problem Relation Age of Onset  . Coronary artery disease Mother   . Heart attack Mother 3  . Hyperlipidemia Mother   . Hypertension Mother   . Stomach cancer Father   . Hypertension Daughter   . Hyperlipidemia Daughter   . Arthritis Unknown        parent  . Transient ischemic attack Unknown        parent  . Colon cancer Neg Hx     Review of Systems  Constitutional: Positive for fatigue (energy level improving). Negative for appetite change, chills and fever.  Respiratory: Positive for cough (minimal scant sputum) and shortness of breath (little at time). Negative for wheezing.   Cardiovascular: Positive for leg swelling. Negative for chest pain and palpitations.  Gastrointestinal: Positive for constipation (improving). Negative for abdominal pain.       Occ GERD  Endocrine: Positive for cold intolerance.  Neurological: Negative for light-headedness and headaches.       Objective:   Vitals:   07/23/17 1311  BP: (!) 150/84  Pulse: 76  Resp: 16  Temp: 97.6 F (36.4 C)  SpO2: 99%   BP Readings from Last 3 Encounters:  07/23/17 (!) 150/84  07/09/17 (!) 110/97  06/25/17 (!) 96/54   Wt Readings from Last 3 Encounters:  07/23/17 138 lb (62.6 kg)  07/09/17 139 lb 12.4 oz (63.4 kg)  06/25/17 146 lb (66.2 kg)   Body mass index is 25.24 kg/m.   Physical Exam    Constitutional: Appears well-developed and well-nourished. No distress.  HENT:  Head: Normocephalic and atraumatic.  Neck: Neck supple. No tracheal deviation present. No thyromegaly present.  No cervical lymphadenopathy Cardiovascular: Normal rate, regular rhythm and normal heart sounds.   No murmur heard. No carotid bruit .  No edema Pulmonary/Chest: Effort normal and breath sounds normal. No respiratory distress. No has no wheezes. No rales.  Skin: Skin is warm and dry. Not diaphoretic.   Psychiatric: Normal mood and affect. Behavior is normal.      Assessment & Plan:    See Problem List for Assessment and Plan of chronic medical problems.

## 2017-07-23 ENCOUNTER — Ambulatory Visit (INDEPENDENT_AMBULATORY_CARE_PROVIDER_SITE_OTHER): Payer: Medicare HMO | Admitting: Internal Medicine

## 2017-07-23 ENCOUNTER — Encounter: Payer: Self-pay | Admitting: Internal Medicine

## 2017-07-23 ENCOUNTER — Ambulatory Visit (INDEPENDENT_AMBULATORY_CARE_PROVIDER_SITE_OTHER)
Admission: RE | Admit: 2017-07-23 | Discharge: 2017-07-23 | Disposition: A | Discharge status: Home / Self Care | Payer: Medicare HMO | Source: Ambulatory Visit | Attending: Internal Medicine | Admitting: Internal Medicine

## 2017-07-23 ENCOUNTER — Other Ambulatory Visit (INDEPENDENT_AMBULATORY_CARE_PROVIDER_SITE_OTHER): Payer: Medicare HMO

## 2017-07-23 VITALS — BP 150/84 | HR 76 | Temp 97.6°F | Resp 16 | Wt 138.0 lb

## 2017-07-23 DIAGNOSIS — J181 Lobar pneumonia, unspecified organism: Secondary | ICD-10-CM

## 2017-07-23 DIAGNOSIS — R6520 Severe sepsis without septic shock: Secondary | ICD-10-CM

## 2017-07-23 DIAGNOSIS — J9601 Acute respiratory failure with hypoxia: Secondary | ICD-10-CM

## 2017-07-23 DIAGNOSIS — J189 Pneumonia, unspecified organism: Secondary | ICD-10-CM | POA: Diagnosis not present

## 2017-07-23 DIAGNOSIS — I10 Essential (primary) hypertension: Secondary | ICD-10-CM | POA: Diagnosis not present

## 2017-07-23 DIAGNOSIS — A419 Sepsis, unspecified organism: Secondary | ICD-10-CM | POA: Diagnosis not present

## 2017-07-23 DIAGNOSIS — D649 Anemia, unspecified: Secondary | ICD-10-CM | POA: Diagnosis not present

## 2017-07-23 DIAGNOSIS — D693 Immune thrombocytopenic purpura: Secondary | ICD-10-CM

## 2017-07-23 LAB — COMPREHENSIVE METABOLIC PANEL
ALBUMIN: 3.8 g/dL (ref 3.5–5.2)
ALT: 18 U/L (ref 0–35)
AST: 19 U/L (ref 0–37)
Alkaline Phosphatase: 77 U/L (ref 39–117)
BUN: 22 mg/dL (ref 6–23)
CALCIUM: 9.4 mg/dL (ref 8.4–10.5)
CHLORIDE: 101 meq/L (ref 96–112)
CO2: 28 meq/L (ref 19–32)
Creatinine, Ser: 0.79 mg/dL (ref 0.40–1.20)
GFR: 73.36 mL/min (ref >60.00)
Glucose, Bld: 106 mg/dL — ABNORMAL HIGH (ref 70–99)
POTASSIUM: 4.3 meq/L (ref 3.5–5.1)
Sodium: 137 mEq/L (ref 135–145)
Total Bilirubin: 0.3 mg/dL (ref 0.2–1.2)
Total Protein: 7.1 g/dL (ref 6.0–8.3)

## 2017-07-23 LAB — CBC WITH DIFFERENTIAL/PLATELET
Basophils Absolute: 0.1 10*3/uL (ref 0.0–0.1)
Basophils Relative: 0.9 % (ref 0.0–3.0)
EOS ABS: 0.6 10*3/uL (ref 0.0–0.7)
EOS PCT: 4.7 % (ref 0.0–5.0)
HEMATOCRIT: 36.3 % (ref 36.0–46.0)
HEMOGLOBIN: 11.7 g/dL — AB (ref 12.0–15.0)
LYMPHS PCT: 26.9 % (ref 12.0–46.0)
Lymphs Abs: 3.2 10*3/uL (ref 0.7–4.0)
MCHC: 32.3 g/dL (ref 30.0–36.0)
MCV: 86.2 fl (ref 78.0–100.0)
MONOS PCT: 10.6 % (ref 3.0–12.0)
Monocytes Absolute: 1.3 10*3/uL — ABNORMAL HIGH (ref 0.1–1.0)
NEUTROS ABS: 6.8 10*3/uL (ref 1.4–7.7)
Neutrophils Relative %: 56.9 % (ref 43.0–77.0)
PLATELETS: 185 10*3/uL (ref 150.0–400.0)
RBC: 4.21 Mil/uL (ref 3.87–5.11)
RDW: 14.5 % (ref 11.5–15.5)
WBC: 11.9 10*3/uL — AB (ref 4.0–10.5)

## 2017-07-23 LAB — IRON: Iron: 44 ug/dL (ref 42–145)

## 2017-07-23 NOTE — Assessment & Plan Note (Signed)
Resolved

## 2017-07-23 NOTE — Assessment & Plan Note (Signed)
Elevated here, but she does have whitecoat hypertension She will monitor at home Continue metoprolol at current dose CMP

## 2017-07-23 NOTE — Assessment & Plan Note (Signed)
On chronic steroids 2.5 mg daily Following with hematology Will check CBC

## 2017-07-23 NOTE — Assessment & Plan Note (Signed)
Symptoms significantly improved Will check chest x-ray to make sure it has completely resolved

## 2017-07-23 NOTE — Assessment & Plan Note (Signed)
Recheck CBC, iron, ferritin

## 2017-07-23 NOTE — Patient Instructions (Addendum)
Have a chest xray and blood work today.  Test(s) ordered today. Your results will be released to [place] (or called to you) after review, usually within 72hours after test completion. If any changes need to be made, you will be notified at that same time.  Medications reviewed and updated.  No changes recommended at this time.     Please followup in 3 months

## 2017-07-24 ENCOUNTER — Encounter: Payer: Self-pay | Admitting: Internal Medicine

## 2017-07-24 DIAGNOSIS — M1711 Unilateral primary osteoarthritis, right knee: Secondary | ICD-10-CM | POA: Diagnosis not present

## 2017-07-24 DIAGNOSIS — J189 Pneumonia, unspecified organism: Secondary | ICD-10-CM | POA: Diagnosis not present

## 2017-07-24 DIAGNOSIS — R69 Illness, unspecified: Secondary | ICD-10-CM | POA: Diagnosis not present

## 2017-07-24 DIAGNOSIS — Z7952 Long term (current) use of systemic steroids: Secondary | ICD-10-CM | POA: Diagnosis not present

## 2017-07-24 DIAGNOSIS — I2510 Atherosclerotic heart disease of native coronary artery without angina pectoris: Secondary | ICD-10-CM | POA: Diagnosis not present

## 2017-07-24 DIAGNOSIS — D693 Immune thrombocytopenic purpura: Secondary | ICD-10-CM | POA: Diagnosis not present

## 2017-07-24 DIAGNOSIS — M545 Low back pain: Secondary | ICD-10-CM | POA: Diagnosis not present

## 2017-07-24 DIAGNOSIS — I739 Peripheral vascular disease, unspecified: Secondary | ICD-10-CM | POA: Diagnosis not present

## 2017-07-24 LAB — TSH: TSH: 2.05 u[IU]/mL (ref 0.35–4.50)

## 2017-07-24 LAB — FERRITIN: Ferritin: 100.1 ng/mL (ref 10.0–291.0)

## 2017-07-25 DIAGNOSIS — J189 Pneumonia, unspecified organism: Secondary | ICD-10-CM | POA: Diagnosis not present

## 2017-07-25 DIAGNOSIS — I2510 Atherosclerotic heart disease of native coronary artery without angina pectoris: Secondary | ICD-10-CM | POA: Diagnosis not present

## 2017-07-25 DIAGNOSIS — D693 Immune thrombocytopenic purpura: Secondary | ICD-10-CM | POA: Diagnosis not present

## 2017-07-25 DIAGNOSIS — M545 Low back pain: Secondary | ICD-10-CM | POA: Diagnosis not present

## 2017-07-25 DIAGNOSIS — M1711 Unilateral primary osteoarthritis, right knee: Secondary | ICD-10-CM | POA: Diagnosis not present

## 2017-07-25 DIAGNOSIS — Z7952 Long term (current) use of systemic steroids: Secondary | ICD-10-CM | POA: Diagnosis not present

## 2017-07-25 DIAGNOSIS — R69 Illness, unspecified: Secondary | ICD-10-CM | POA: Diagnosis not present

## 2017-07-25 DIAGNOSIS — I739 Peripheral vascular disease, unspecified: Secondary | ICD-10-CM | POA: Diagnosis not present

## 2017-07-25 DIAGNOSIS — R2689 Other abnormalities of gait and mobility: Secondary | ICD-10-CM | POA: Diagnosis not present

## 2017-07-31 DIAGNOSIS — I739 Peripheral vascular disease, unspecified: Secondary | ICD-10-CM | POA: Diagnosis not present

## 2017-07-31 DIAGNOSIS — I2510 Atherosclerotic heart disease of native coronary artery without angina pectoris: Secondary | ICD-10-CM | POA: Diagnosis not present

## 2017-07-31 DIAGNOSIS — D693 Immune thrombocytopenic purpura: Secondary | ICD-10-CM | POA: Diagnosis not present

## 2017-07-31 DIAGNOSIS — J189 Pneumonia, unspecified organism: Secondary | ICD-10-CM | POA: Diagnosis not present

## 2017-07-31 DIAGNOSIS — M1711 Unilateral primary osteoarthritis, right knee: Secondary | ICD-10-CM | POA: Diagnosis not present

## 2017-07-31 DIAGNOSIS — Z7952 Long term (current) use of systemic steroids: Secondary | ICD-10-CM | POA: Diagnosis not present

## 2017-07-31 DIAGNOSIS — R69 Illness, unspecified: Secondary | ICD-10-CM | POA: Diagnosis not present

## 2017-07-31 DIAGNOSIS — M545 Low back pain: Secondary | ICD-10-CM | POA: Diagnosis not present

## 2017-08-05 ENCOUNTER — Encounter: Payer: Self-pay | Admitting: Internal Medicine

## 2017-08-05 MED ORDER — METOPROLOL TARTRATE 25 MG PO TABS: 25.0000 mg | ORAL_TABLET | Freq: Two times a day (BID) | ORAL | 1 refills | Status: DC

## 2017-08-07 DIAGNOSIS — M1711 Unilateral primary osteoarthritis, right knee: Secondary | ICD-10-CM | POA: Diagnosis not present

## 2017-08-07 DIAGNOSIS — M545 Low back pain: Secondary | ICD-10-CM | POA: Diagnosis not present

## 2017-08-07 DIAGNOSIS — D693 Immune thrombocytopenic purpura: Secondary | ICD-10-CM | POA: Diagnosis not present

## 2017-08-07 DIAGNOSIS — I739 Peripheral vascular disease, unspecified: Secondary | ICD-10-CM | POA: Diagnosis not present

## 2017-08-07 DIAGNOSIS — R69 Illness, unspecified: Secondary | ICD-10-CM | POA: Diagnosis not present

## 2017-08-07 DIAGNOSIS — Z7952 Long term (current) use of systemic steroids: Secondary | ICD-10-CM | POA: Diagnosis not present

## 2017-08-07 DIAGNOSIS — F419 Anxiety disorder, unspecified: Secondary | ICD-10-CM | POA: Diagnosis not present

## 2017-08-07 DIAGNOSIS — I2510 Atherosclerotic heart disease of native coronary artery without angina pectoris: Secondary | ICD-10-CM | POA: Diagnosis not present

## 2017-08-07 DIAGNOSIS — J189 Pneumonia, unspecified organism: Secondary | ICD-10-CM | POA: Diagnosis not present

## 2017-08-13 DIAGNOSIS — H04123 Dry eye syndrome of bilateral lacrimal glands: Secondary | ICD-10-CM | POA: Diagnosis not present

## 2017-08-15 DIAGNOSIS — B078 Other viral warts: Secondary | ICD-10-CM | POA: Diagnosis not present

## 2017-08-21 ENCOUNTER — Telehealth: Payer: Self-pay | Admitting: Cardiology

## 2017-08-21 ENCOUNTER — Encounter: Payer: Self-pay | Admitting: Cardiology

## 2017-08-21 DIAGNOSIS — I2510 Atherosclerotic heart disease of native coronary artery without angina pectoris: Secondary | ICD-10-CM | POA: Insufficient documentation

## 2017-08-21 NOTE — Telephone Encounter (Signed)
New Message:    Pt says she have shortness of breath sometimes,a little weakness and dizziness.She says she just does not feel right.I made her an appt to see [facility] tomorrow.Please call to evaluate.

## 2017-08-21 NOTE — Telephone Encounter (Signed)
Returned the call to the patient. She stated that for the past 2 weeks that she has been feeling lightheaded and shortness of breath at times. This occurs when she ambulates and at rest but it is only occasionally. She has been having some mild edema on both feet but this diminshes during the night. She currently takes 20 mg furosemide. She stated that her weight has been staying around 140 pounds. She denies chest pain.  The patient was recently hospitalized for pneumonia and severe sepsis, discharged on 07/09/17.  An appointment has been made for her tomorrow 4/18 with APP.

## 2017-08-22 ENCOUNTER — Encounter: Payer: Self-pay | Admitting: Cardiology

## 2017-08-22 ENCOUNTER — Ambulatory Visit: Payer: Medicare HMO | Admitting: Cardiology

## 2017-08-22 VITALS — BP 142/66 | HR 76 | Ht 62.0 in | Wt 139.8 lb

## 2017-08-22 DIAGNOSIS — J181 Lobar pneumonia, unspecified organism: Secondary | ICD-10-CM | POA: Diagnosis not present

## 2017-08-22 DIAGNOSIS — I83892 Varicose veins of left lower extremities with other complications: Secondary | ICD-10-CM

## 2017-08-22 DIAGNOSIS — I350 Nonrheumatic aortic (valve) stenosis: Secondary | ICD-10-CM | POA: Diagnosis not present

## 2017-08-22 DIAGNOSIS — J189 Pneumonia, unspecified organism: Secondary | ICD-10-CM

## 2017-08-22 DIAGNOSIS — I739 Peripheral vascular disease, unspecified: Secondary | ICD-10-CM

## 2017-08-22 DIAGNOSIS — I25118 Atherosclerotic heart disease of native coronary artery with other forms of angina pectoris: Secondary | ICD-10-CM | POA: Diagnosis not present

## 2017-08-22 DIAGNOSIS — I10 Essential (primary) hypertension: Secondary | ICD-10-CM

## 2017-08-22 MED ORDER — METOPROLOL TARTRATE 25 MG PO TABS: 12.5000 mg | ORAL_TABLET | Freq: Two times a day (BID) | ORAL | 1 refills | Status: DC

## 2017-08-22 NOTE — Patient Instructions (Signed)
Medication Instructions:  DECREASE Metoprolol to 12.5mg  Take 1 tablet twice a day-CALL OFFICE in 2 wks to let us know if this works for you  Labwork: None   Testing/Procedures: None   Follow-Up: Your physician wants you to follow-up in: 6 months with Dr Percival Spanish. You will receive a reminder letter in the mail two months in advance. If you don't receive a letter, please call our office to schedule the follow-up appointment.  Any Other Special Instructions Will Be Listed Below (If Applicable).  If you need a refill on your cardiac medications before your next appointment, please call your pharmacy.

## 2017-08-22 NOTE — Progress Notes (Signed)
08/22/2017 Tamara Robertson   1929-09-07  [id number]  Primary Physician Lawerance Bach, Bobette Mo, MD Primary Cardiologist: Dr Antoine Poche  HPI:  Delightful 84 y/o female followed by Dr Antoine Poche with a history of non critical CAD, PVD with remote PTA in [place], and chronic LE edema. She is a widow, her husband passed 3 years ago, they were married 64 years. She lives alone in a condo though she is considering moving to an adult apartment type facility. She brought [place] brownies today!  She was admitted 06/23/17 with CAP. This deteriorated to sepsis and respiratory failure requiring intubation, AKI, and encephalopathy. She was extubated 2/27. She had a pleural effusion tapped 2/28. Echo done during that admission showed an EF of 60-65% with moderate AS. She was discharge to a rehab facility 07/09/17 but only stayed 4 days. We did not see her during that hospitalization. She did f/u with her PCP 07/23/17 and was doing well.   She is in the office today with complaints of persistent weakness and feeling "like I'm in a fog". She wondered if was secondary to to Lopressor which was new for her. She did have a "sharp" pain in her left chest but this was brief. She denies any unusual dyspnea. No syncope or pre syncope. She has chronic LE edema but this is unchanged.  She denies any tachycardia.    Current Outpatient Medications  Medication Sig Dispense Refill  . acetaminophen (TYLENOL) 325 MG tablet Take 650 mg by mouth every 6 (six) hours as needed for fever.    . Biotin 2500 MCG CAPS Take 1 capsule by mouth daily.     . Calcium-Magnesium-Vitamin D (CITRACAL CALCIUM+D) 600-40-500 MG-MG-UNIT TB24 Take 1 tablet by mouth daily.     . feeding supplement, ENSURE ENLIVE, (ENSURE ENLIVE) LIQD Take 237 mLs by mouth 2 (two) times daily between meals. 237 mL 12  . Flaxseed MISC Take 5 mLs by mouth daily. Take 1 tsp daily    . furosemide (LASIX) 20 MG tablet Take 1 tablet (20 mg total) by mouth daily. 90 tablet 3  .  latanoprost (XALATAN) 0.005 % ophthalmic solution Place 1 drop into both eyes at bedtime.    . metoprolol tartrate (LOPRESSOR) 25 MG tablet Take 0.5 tablets (12.5 mg total) by mouth 2 (two) times daily. 60 tablet 1  . Multiple Vitamin (MULTIVITAMIN) tablet Take 1 tablet by mouth daily.      . pantoprazole (PROTONIX) 20 MG tablet TAKE 1 TABLET BY MOUTH ONCE DAILY 90 tablet 2  . polyethylene glycol (MIRALAX / GLYCOLAX) packet Take 17 g by mouth daily as needed for moderate constipation.     . predniSONE (DELTASONE) 2.5 MG tablet TAKE ONE TABLET BY MOUTH ONCE DAILY 90 tablet 3  . Probiotic Product (PROBIOTIC FORMULA PO) Take 1 tablet by mouth daily.       No current facility-administered medications for this visit.     Allergies  Allergen Reactions  . Aspirin Other (See Comments)    ITP  . Sulfa Antibiotics Other (See Comments)    dizziness  . Contrast Media [Iodinated Diagnostic Agents] Hives  . Lasix [Furosemide] Rash  . Statins Other (See Comments)    Past Medical History:  Diagnosis Date  . Anxiety   . CAD (coronary artery disease)    RCA 40% stenosis cath 01/2011  . Cholelithiasis    s/p lap chole 09/2014  . Diverticular stricture [facility]) 2006   [facility]  . DIVERTICULITIS, HX OF   .  Diverticulosis   . DYSLIPIDEMIA   . Elevated LFTs   . GERD   . Glaucoma   . Hepatic steatosis   . HOH (hard of hearing)   . Immune thrombocytopenic purpura (HCC)    chronic - baseline 80-100K, on pred  . Irritable bowel syndrome   . Left ovarian cyst dx 01/2013 CT   working with gyn, ?malignant - elevated tumor marker OVA1  . OSTEOARTHRITIS, KNEE, RIGHT   . OSTEOPENIA   . OVERACTIVE BLADDER   . UNSPECIFIED PERIPHERAL VASCULAR DISEASE   . URINARY INCONTINENCE     Social History   Socioeconomic History  . Marital status: Widowed    Spouse name: Not on file  . Number of children: Not on file  . Years of education: Not on file  . Highest education level: Not on file   Occupational History  . Occupation: Retired    Comment: Programmer, multimedia  Social Needs  . Financial resource strain: Not on file  . Food insecurity:    Worry: Not on file    Inability: Not on file  . Transportation needs:    Medical: Not on file    Non-medical: Not on file  Tobacco Use  . Smoking status: Never Smoker  . Smokeless tobacco: Never Used  Substance and Sexual Activity  . Alcohol use: No    Alcohol/week: 0.0 oz    Comment: rarely  . Drug use: No  . Sexual activity: Not on file  Lifestyle  . Physical activity:    Days per week: Not on file    Minutes per session: Not on file  . Stress: Not on file  Relationships  . Social connections:    Talks on phone: Not on file    Gets together: Not on file    Attends religious service: Not on file    Active member of club or organization: Not on file    Attends meetings of clubs or organizations: Not on file    Relationship status: Not on file  . Intimate partner violence:    Fear of current or ex partner: Not on file    Emotionally abused: Not on file    Physically abused: Not on file    Forced sexual activity: Not on file  Other Topics Concern  . 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    [place] to [place] from [place] 05/2010 to be close to kids     Family History  Problem Relation Age of Onset  . Coronary artery disease Mother   . Heart attack Mother 52  . Hyperlipidemia Mother   . Hypertension Mother   . Stomach cancer Father   . Hypertension Daughter   . Hyperlipidemia Daughter   . Arthritis Unknown        parent  . Transient ischemic attack Unknown        parent  . Colon cancer Neg Hx      Review of Systems: General: negative for chills, fever, night sweats or weight changes.  Cardiovascular: negative for chest pain, dyspnea on exertion, edema, orthopnea, palpitations, paroxysmal nocturnal dyspnea or shortness of  breath Dermatological: negative for rash Respiratory: negative for cough or wheezing Urologic: negative for hematuria Abdominal: negative for nausea, vomiting, diarrhea, bright red blood per rectum, melena, or hematemesis Neurologic: negative for visual changes, syncope, or dizziness All other systems reviewed and are otherwise negative except as noted above.  Blood pressure (!) 142/66, pulse 76, height 5\' 2"  (1.575 m), weight 139 lb 12.8 oz (63.4 kg), SpO2 98 %.  General appearance: alert, cooperative and no distress Neck: no carotid bruit and no JVD Lungs: clear to auscultation bilaterally Heart: regular rate and rhythm and soft systolic murmur AOV and LSB, preserved S2 Extremities: trace edema, positive varicosities Skin: Skin color, texture, turgor normal. No rashes or lesions Neurologic: Grossly normal  EKG NSR- HR 75  ASSESSMENT AND PLAN:   Community acquired pneumonia of right lower lobe of lung (HCC) Prolonged hospitalization 2/17-07/09/17- complicated by sepsis, respiratory failure and intubation, AKI, and encephalopathy.  CAD (coronary artery disease) Minor CAD at cath 2012- (40% RCA)  Aortic stenosis, moderate Normal LVF March 2019  Hypertension Controlled- she brought in readings from home  PVD (peripheral vascular disease) (HCC) H/O LEA stent [place] in 2011  Varicose veins of left lower extremity with complications Know LE edema with varicosities, she is unable to get her compression stockings on secondary to Lt hip DJD   PLAN  She has had some loose stools and I suggested she decrease her Myralax to QOD. I suggested she could try Lopressor 12.5 mg BID to see if that makes any difference in her symptoms. She'll continue to follow her B/P at home.   Corine Shelter PA-C 08/22/2017 1:56 PM

## 2017-08-22 NOTE — Assessment & Plan Note (Signed)
Normal LVF March 2019

## 2017-08-22 NOTE — Assessment & Plan Note (Signed)
Minor CAD at cath 2012- (40% RCA)

## 2017-08-22 NOTE — Assessment & Plan Note (Signed)
Prolonged hospitalization 0/62-10/14/46- complicated by sepsis, respiratory failure and intubation, AKI, and encephalopathy.

## 2017-08-22 NOTE — Assessment & Plan Note (Signed)
Controlled- she brought in readings from home

## 2017-08-22 NOTE — Assessment & Plan Note (Signed)
Know LE edema with varicosities, she is unable to get her compression stockings on secondary to Lt hip DJD

## 2017-08-22 NOTE — Assessment & Plan Note (Signed)
H/O LEA stent [place] in 2011

## 2017-09-04 ENCOUNTER — Telehealth: Payer: Self-pay

## 2017-09-04 DIAGNOSIS — R1900 Intra-abdominal and pelvic swelling, mass and lump, unspecified site: Secondary | ICD-10-CM

## 2017-09-06 DIAGNOSIS — D485 Neoplasm of uncertain behavior of skin: Secondary | ICD-10-CM | POA: Diagnosis not present

## 2017-09-06 DIAGNOSIS — C44622 Squamous cell carcinoma of skin of right upper limb, including shoulder: Secondary | ICD-10-CM | POA: Diagnosis not present

## 2017-09-06 DIAGNOSIS — I8311 Varicose veins of right lower extremity with inflammation: Secondary | ICD-10-CM | POA: Diagnosis not present

## 2017-09-06 DIAGNOSIS — I8312 Varicose veins of left lower extremity with inflammation: Secondary | ICD-10-CM | POA: Diagnosis not present

## 2017-09-06 DIAGNOSIS — L603 Nail dystrophy: Secondary | ICD-10-CM | POA: Diagnosis not present

## 2017-09-06 DIAGNOSIS — I872 Venous insufficiency (chronic) (peripheral): Secondary | ICD-10-CM | POA: Diagnosis not present

## 2017-09-12 NOTE — Telephone Encounter (Signed)
Gave Ms Gasper her appointment for her [place] at [facility] for 01-22-18 at 2 pm.  Arrive with a full bladder. She will see Dr. Gerarda Fraction on 01-24-18 @ 1 :15 pm. Ms Sesma verbalized understanding.

## 2017-09-20 ENCOUNTER — Telehealth: Payer: Self-pay | Admitting: Emergency Medicine

## 2017-09-20 ENCOUNTER — Emergency Department (HOSPITAL_COMMUNITY): Payer: Medicare HMO

## 2017-09-20 ENCOUNTER — Encounter (HOSPITAL_COMMUNITY): Payer: Self-pay

## 2017-09-20 ENCOUNTER — Other Ambulatory Visit: Payer: Self-pay

## 2017-09-20 ENCOUNTER — Inpatient Hospital Stay (HOSPITAL_COMMUNITY): Payer: Medicare HMO

## 2017-09-20 ENCOUNTER — Inpatient Hospital Stay (HOSPITAL_COMMUNITY)
Admission: EM | Admit: 2017-09-20 | Discharge: 2017-09-22 | DRG: 390 | Disposition: A | Discharge status: Home / Self Care | Payer: Medicare HMO | Attending: Internal Medicine | Admitting: Internal Medicine

## 2017-09-20 DIAGNOSIS — Z886 Allergy status to analgesic agent status: Secondary | ICD-10-CM

## 2017-09-20 DIAGNOSIS — K56600 Partial intestinal obstruction, unspecified as to cause: Principal | ICD-10-CM | POA: Diagnosis present

## 2017-09-20 DIAGNOSIS — R1111 Vomiting without nausea: Secondary | ICD-10-CM | POA: Diagnosis not present

## 2017-09-20 DIAGNOSIS — Z7952 Long term (current) use of systemic steroids: Secondary | ICD-10-CM

## 2017-09-20 DIAGNOSIS — R112 Nausea with vomiting, unspecified: Secondary | ICD-10-CM

## 2017-09-20 DIAGNOSIS — K56609 Unspecified intestinal obstruction, unspecified as to partial versus complete obstruction: Secondary | ICD-10-CM

## 2017-09-20 DIAGNOSIS — I10 Essential (primary) hypertension: Secondary | ICD-10-CM

## 2017-09-20 DIAGNOSIS — D72829 Elevated white blood cell count, unspecified: Secondary | ICD-10-CM | POA: Diagnosis not present

## 2017-09-20 DIAGNOSIS — G8929 Other chronic pain: Secondary | ICD-10-CM | POA: Diagnosis present

## 2017-09-20 DIAGNOSIS — I2510 Atherosclerotic heart disease of native coronary artery without angina pectoris: Secondary | ICD-10-CM | POA: Diagnosis present

## 2017-09-20 DIAGNOSIS — H409 Unspecified glaucoma: Secondary | ICD-10-CM | POA: Diagnosis not present

## 2017-09-20 DIAGNOSIS — D649 Anemia, unspecified: Secondary | ICD-10-CM | POA: Diagnosis present

## 2017-09-20 DIAGNOSIS — Z9841 Cataract extraction status, right eye: Secondary | ICD-10-CM | POA: Diagnosis not present

## 2017-09-20 DIAGNOSIS — R109 Unspecified abdominal pain: Secondary | ICD-10-CM | POA: Diagnosis not present

## 2017-09-20 DIAGNOSIS — K219 Gastro-esophageal reflux disease without esophagitis: Secondary | ICD-10-CM | POA: Diagnosis present

## 2017-09-20 DIAGNOSIS — M545 Low back pain, unspecified: Secondary | ICD-10-CM | POA: Diagnosis present

## 2017-09-20 DIAGNOSIS — H9193 Unspecified hearing loss, bilateral: Secondary | ICD-10-CM | POA: Diagnosis not present

## 2017-09-20 DIAGNOSIS — I350 Nonrheumatic aortic (valve) stenosis: Secondary | ICD-10-CM

## 2017-09-20 DIAGNOSIS — F419 Anxiety disorder, unspecified: Secondary | ICD-10-CM

## 2017-09-20 DIAGNOSIS — Z888 Allergy status to other drugs, medicaments and biological substances status: Secondary | ICD-10-CM | POA: Diagnosis not present

## 2017-09-20 DIAGNOSIS — F32A Depression, unspecified: Secondary | ICD-10-CM | POA: Diagnosis present

## 2017-09-20 DIAGNOSIS — M1711 Unilateral primary osteoarthritis, right knee: Secondary | ICD-10-CM | POA: Diagnosis present

## 2017-09-20 DIAGNOSIS — R69 Illness, unspecified: Secondary | ICD-10-CM | POA: Diagnosis not present

## 2017-09-20 DIAGNOSIS — K5730 Diverticulosis of large intestine without perforation or abscess without bleeding: Secondary | ICD-10-CM | POA: Diagnosis present

## 2017-09-20 DIAGNOSIS — K589 Irritable bowel syndrome without diarrhea: Secondary | ICD-10-CM | POA: Diagnosis present

## 2017-09-20 DIAGNOSIS — Z90710 Acquired absence of both cervix and uterus: Secondary | ICD-10-CM | POA: Diagnosis not present

## 2017-09-20 DIAGNOSIS — E785 Hyperlipidemia, unspecified: Secondary | ICD-10-CM | POA: Diagnosis present

## 2017-09-20 DIAGNOSIS — Z882 Allergy status to sulfonamides status: Secondary | ICD-10-CM

## 2017-09-20 DIAGNOSIS — Z9842 Cataract extraction status, left eye: Secondary | ICD-10-CM

## 2017-09-20 DIAGNOSIS — Z9081 Acquired absence of spleen: Secondary | ICD-10-CM

## 2017-09-20 DIAGNOSIS — Z91041 Radiographic dye allergy status: Secondary | ICD-10-CM

## 2017-09-20 DIAGNOSIS — Z8249 Family history of ischemic heart disease and other diseases of the circulatory system: Secondary | ICD-10-CM

## 2017-09-20 DIAGNOSIS — Z79899 Other long term (current) drug therapy: Secondary | ICD-10-CM

## 2017-09-20 DIAGNOSIS — Z862 Personal history of diseases of the blood and blood-forming organs and certain disorders involving the immune mechanism: Secondary | ICD-10-CM | POA: Diagnosis not present

## 2017-09-20 DIAGNOSIS — D693 Immune thrombocytopenic purpura: Secondary | ICD-10-CM

## 2017-09-20 DIAGNOSIS — Z66 Do not resuscitate: Secondary | ICD-10-CM | POA: Diagnosis present

## 2017-09-20 DIAGNOSIS — R0789 Other chest pain: Secondary | ICD-10-CM | POA: Diagnosis not present

## 2017-09-20 DIAGNOSIS — K2970 Gastritis, unspecified, without bleeding: Secondary | ICD-10-CM | POA: Diagnosis not present

## 2017-09-20 DIAGNOSIS — I739 Peripheral vascular disease, unspecified: Secondary | ICD-10-CM | POA: Diagnosis present

## 2017-09-20 LAB — COMPREHENSIVE METABOLIC PANEL
ALBUMIN: 4 g/dL (ref 3.5–5.0)
ALK PHOS: 75 U/L (ref 38–126)
ALT: 15 U/L (ref 14–54)
AST: 24 U/L (ref 15–41)
Anion gap: 16 — ABNORMAL HIGH (ref 5–15)
BUN: 30 mg/dL — AB (ref 6–20)
CHLORIDE: 97 mmol/L — AB (ref 101–111)
CO2: 28 mmol/L (ref 22–32)
CREATININE: 0.99 mg/dL (ref 0.44–1.00)
Calcium: 9.2 mg/dL (ref 8.9–10.3)
GFR calc non Af Amer: 50 mL/min — ABNORMAL LOW (ref >60)
GFR, EST AFRICAN AMERICAN: 58 mL/min — AB (ref >60)
GLUCOSE: 114 mg/dL — AB (ref 65–99)
Potassium: 3.8 mmol/L (ref 3.5–5.1)
Sodium: 141 mmol/L (ref 135–145)
Total Bilirubin: 0.8 mg/dL (ref 0.3–1.2)
Total Protein: 7.7 g/dL (ref 6.5–8.1)

## 2017-09-20 LAB — URINALYSIS, ROUTINE W REFLEX MICROSCOPIC
Bacteria, UA: NONE SEEN
Bilirubin Urine: NEGATIVE
Glucose, UA: NEGATIVE mg/dL
Hgb urine dipstick: NEGATIVE
Ketones, ur: NEGATIVE mg/dL
Nitrite: NEGATIVE
PH: 6 (ref 5.0–8.0)
Protein, ur: NEGATIVE mg/dL
SPECIFIC GRAVITY, URINE: 1.011 (ref 1.005–1.030)

## 2017-09-20 LAB — CBC
HCT: 42.8 % (ref 36.0–46.0)
Hemoglobin: 14.1 g/dL (ref 12.0–15.0)
MCH: 28 pg (ref 26.0–34.0)
MCHC: 32.9 g/dL (ref 30.0–36.0)
MCV: 85.1 fL (ref 78.0–100.0)
PLATELETS: 184 10*3/uL (ref 150–400)
RBC: 5.03 MIL/uL (ref 3.87–5.11)
RDW: 14.6 % (ref 11.5–15.5)
WBC: 15.2 10*3/uL — AB (ref 4.0–10.5)

## 2017-09-20 LAB — LIPASE, BLOOD: LIPASE: 23 U/L (ref 11–51)

## 2017-09-20 LAB — MAGNESIUM: MAGNESIUM: 2.2 mg/dL (ref 1.7–2.4)

## 2017-09-20 LAB — TROPONIN I

## 2017-09-20 MED ORDER — ACETAMINOPHEN 325 MG PO TABS
650.0000 mg | ORAL_TABLET | Freq: Four times a day (QID) | ORAL | Status: DC | PRN
  Administered 2017-09-20: 650 mg via ORAL
  Filled 2017-09-20: qty 2

## 2017-09-20 MED ORDER — SODIUM CHLORIDE 0.9% FLUSH
3.0000 mL | Freq: Two times a day (BID) | INTRAVENOUS | Status: DC
  Administered 2017-09-21 (×2): 3 mL via INTRAVENOUS

## 2017-09-20 MED ORDER — DIPHENHYDRAMINE HCL 25 MG PO CAPS: 50.0000 mg | ORAL_CAPSULE | Freq: Once | ORAL | Status: DC

## 2017-09-20 MED ORDER — LIDOCAINE 5 % EX PTCH
2.0000 | MEDICATED_PATCH | CUTANEOUS | Status: DC
  Administered 2017-09-20 – 2017-09-21 (×2): 2 via TRANSDERMAL
  Filled 2017-09-20 (×2): qty 2

## 2017-09-20 MED ORDER — METOPROLOL TARTRATE 5 MG/5ML IV SOLN
2.5000 mg | Freq: Three times a day (TID) | INTRAVENOUS | Status: DC
  Administered 2017-09-20 – 2017-09-21 (×3): 2.5 mg via INTRAVENOUS
  Filled 2017-09-20 (×3): qty 5

## 2017-09-20 MED ORDER — FAMOTIDINE IN NACL 20-0.9 MG/50ML-% IV SOLN
20.0000 mg | Freq: Two times a day (BID) | INTRAVENOUS | Status: DC
  Administered 2017-09-20 – 2017-09-21 (×2): 20 mg via INTRAVENOUS
  Filled 2017-09-20 (×2): qty 50

## 2017-09-20 MED ORDER — ONDANSETRON 4 MG PO TBDP: 4.0000 mg | ORAL_TABLET | Freq: Three times a day (TID) | ORAL | 0 refills | Status: DC | PRN

## 2017-09-20 MED ORDER — ONDANSETRON HCL 4 MG PO TABS: 4.0000 mg | ORAL_TABLET | Freq: Four times a day (QID) | ORAL | Status: DC | PRN

## 2017-09-20 MED ORDER — HYDROCORTISONE NA SUCCINATE PF 250 MG IJ SOLR: 200.0000 mg | Freq: Once | INTRAMUSCULAR | Status: DC

## 2017-09-20 MED ORDER — DIPHENHYDRAMINE HCL 50 MG/ML IJ SOLN: 50.0000 mg | Freq: Once | INTRAMUSCULAR | Status: DC

## 2017-09-20 MED ORDER — LIDOCAINE 5 % EX PTCH
1.0000 | MEDICATED_PATCH | CUTANEOUS | Status: DC
  Administered 2017-09-20: 1 via TRANSDERMAL
  Filled 2017-09-20: qty 1

## 2017-09-20 MED ORDER — ONDANSETRON HCL 4 MG/2ML IJ SOLN: 4.0000 mg | Freq: Four times a day (QID) | INTRAMUSCULAR | Status: DC | PRN

## 2017-09-20 MED ORDER — LATANOPROST 0.005 % OP SOLN
1.0000 [drp] | Freq: Every day | OPHTHALMIC | Status: DC
  Administered 2017-09-20 – 2017-09-21 (×2): 1 [drp] via OPHTHALMIC
  Filled 2017-09-20: qty 2.5

## 2017-09-20 MED ORDER — METHYLPREDNISOLONE SODIUM SUCC 40 MG IJ SOLR
20.0000 mg | Freq: Every day | INTRAMUSCULAR | Status: DC
  Administered 2017-09-20: 20 mg via INTRAVENOUS
  Filled 2017-09-20: qty 1

## 2017-09-20 MED ORDER — ONDANSETRON HCL 4 MG/2ML IJ SOLN: 4.0000 mg | Freq: Once | INTRAMUSCULAR | Status: DC

## 2017-09-20 MED ORDER — SODIUM CHLORIDE 0.9 % IV BOLUS
1000.0000 mL | Freq: Once | INTRAVENOUS | Status: AC
  Administered 2017-09-20: 1000 mL via INTRAVENOUS

## 2017-09-20 MED ORDER — TRIAMCINOLONE ACETONIDE 0.1 % EX CREA
1.0000 "application " | TOPICAL_CREAM | Freq: Two times a day (BID) | CUTANEOUS | Status: DC
  Filled 2017-09-20: qty 15

## 2017-09-20 MED ORDER — SODIUM CHLORIDE 0.9 % IV SOLN
INTRAVENOUS | Status: DC
  Administered 2017-09-20 – 2017-09-21 (×2): via INTRAVENOUS
  Filled 2017-09-20 (×3): qty 1000

## 2017-09-20 MED ORDER — ACETAMINOPHEN 650 MG RE SUPP: 650.0000 mg | Freq: Four times a day (QID) | RECTAL | Status: DC | PRN

## 2017-09-20 NOTE — Progress Notes (Signed)
CSW asked to speak with pt about ALF. CSW spoke with pt and pt's daughter. Pt has been actively searching for ALF location. CSW provided pt with list of ALFs in the [place] area to assist with her search.   Wendelyn Breslow, [facility]  [phone]

## 2017-09-20 NOTE — ED Triage Notes (Addendum)
Pt arrives from home by EMS c/o RUQ tenderness, N/V x2 days. Hx of diverticulitis, bowel resection, cholecystectomy. Denies hematemesis. Denies blood on stool. 4mg  zofan given by EMS en route. LBM 5/14, normal.

## 2017-09-20 NOTE — Telephone Encounter (Signed)
Copied from [place] [id number]. Topic: Inquiry >> Sep 20, 2017  8:30 AM [place], NT wrote: Reason for CRM: patient is calling and states she has been throwing up 12 hours yesterday and it started again this morning. She states she is weak from throwing up and can not make it in. She would like to know if Dr. Quay Burow can call her something in.  [place], [place] [address] Phone: [phone] Fax: [phone]

## 2017-09-20 NOTE — Consult Note (Signed)
Reason for Consult:SBO Referring Physician: Rodell Perna, PA  Tamara Robertson is an 84 y.o. female.  HPI: This is an 84 year old female with a history of multiple abdominal procedures who presents today with intermittent nausea and vomiting.  She reports she has intermittent diarrhea and constipation as well.  She has only mild abdominal discomfort.  She is actually just moved her bowels.  Her last emesis was this morning.   She had a CT scan showing a very short segment of small bowel wall thickening in the right lower quadrant with a possible low-grade bowel obstruction as well as a large amount of constipation.  Surgery has been asked to see because of the possible bowel obstruction.  Again, she is currently having diarrhea. There is no blood in her stool no hematemesis.  Again, the pain was described as a mild cramping abdominal pain  Past Medical History:  Diagnosis Date  . Anxiety   . CAD (coronary artery disease)    RCA 40% stenosis cath 01/2011  . Cholelithiasis    s/p lap chole 09/2014  . Diverticular stricture [facility]) 2006   [facility]  . DIVERTICULITIS, HX OF   . Diverticulosis   . DYSLIPIDEMIA   . Elevated LFTs   . GERD   . Glaucoma   . Hepatic steatosis   . HOH (hard of hearing)   . Immune thrombocytopenic purpura (HCC)    chronic - baseline 80-100K, on pred  . Irritable bowel syndrome   . Left ovarian cyst dx 01/2013 CT   working with gyn, ?malignant - elevated tumor marker OVA1  . OSTEOARTHRITIS, KNEE, RIGHT   . OSTEOPENIA   . OVERACTIVE BLADDER   . UNSPECIFIED PERIPHERAL VASCULAR DISEASE   . URINARY INCONTINENCE     Past Surgical History:  Procedure Laterality Date  . ABDOMINAL HYSTERECTOMY  1963  . ANGIOPLASTY    . APPENDECTOMY  1956  . CARDIAC CATHETERIZATION    . CATARACT EXTRACTION, BILATERAL  10/2010  . CHOLECYSTECTOMY N/A 09/16/2014   Procedure: LAPAROSCOPIC CHOLECYSTECTOMY ;  Surgeon: Rolm Bookbinder, MD;  Location: [place];  Service:  General;  Laterality: N/A;  . KNEE ARTHROSCOPY Right   . L pop PTA  10/2009   stent  . LAPAROSCOPIC SIGMOID COLECTOMY  10/2005  . SPLENECTOMY  1954  . VARICOSE VEIN SURGERY Right 1962    Family History  Problem Relation Age of Onset  . Coronary artery disease Mother   . Heart attack Mother 48  . Hyperlipidemia Mother   . Hypertension Mother   . Stomach cancer Father   . Hypertension Daughter   . Hyperlipidemia Daughter   . Arthritis Unknown        parent  . Transient ischemic attack Unknown        parent  . Colon cancer Neg Hx     Social History:  reports that she has never smoked. She has never used smokeless tobacco. She reports that she does not drink alcohol or use drugs.  Allergies:  Allergies  Allergen Reactions  . Aspirin Other (See Comments)    ITP  . Sulfa Antibiotics Other (See Comments)    dizziness  . Contrast Media [Iodinated Diagnostic Agents] Hives  . Lasix [Furosemide] Rash  . Statins Other (See Comments)    Medications: I have reviewed the patient's current medications.  Results for orders placed or performed during the hospital encounter of 09/20/17 (from the past 48 hour(s))  Troponin I     Status: None  Collection Time: 09/20/17 11:50 AM  Result Value Ref Range   Troponin I <0.03 <0.03 ng/mL    Comment: Performed at [facility], [place] [address] [place], [place] [address]  Lipase, blood     Status: None   Collection Time: 09/20/17 11:51 AM  Result Value Ref Range   Lipase 23 11 - 51 U/L    Comment: Performed at [facility], [place] [address], [place], [place] [address]  Comprehensive metabolic panel     Status: Abnormal   Collection Time: 09/20/17 11:51 AM  Result Value Ref Range   Sodium 141 135 - 145 mmol/L   Potassium 3.8 3.5 - 5.1 mmol/L   Chloride 97 (L) 101 - 111 mmol/L   CO2 28 22 - 32 mmol/L   Glucose, Bld 114 (H) 65 - 99 mg/dL   BUN 30 (H) 6 - 20 mg/dL   Creatinine, Ser 0.99 0.44 - 1.00 mg/dL    Calcium 9.2 8.9 - 10.3 mg/dL   Total Protein 7.7 6.5 - 8.1 g/dL   Albumin 4.0 3.5 - 5.0 g/dL   AST 24 15 - 41 U/L   ALT 15 14 - 54 U/L   Alkaline Phosphatase 75 38 - 126 U/L   Total Bilirubin 0.8 0.3 - 1.2 mg/dL   GFR calc non Af Amer 50 (L) >60 mL/min   GFR calc Af Amer 58 (L) >60 mL/min    Comment: (NOTE) The eGFR has been calculated using the CKD EPI equation. This calculation has not been validated in all clinical situations. eGFR's persistently <60 mL/min signify possible Chronic Kidney Disease.    Anion gap 16 (H) 5 - 15    Comment: Performed at [facility], [place] [address], [place], [place] [address]  CBC     Status: Abnormal   Collection Time: 09/20/17 11:51 AM  Result Value Ref Range   WBC 15.2 (H) 4.0 - 10.5 K/uL   RBC 5.03 3.87 - 5.11 MIL/uL   Hemoglobin 14.1 12.0 - 15.0 g/dL   HCT 42.8 36.0 - 46.0 %   MCV 85.1 78.0 - 100.0 fL   MCH 28.0 26.0 - 34.0 pg   MCHC 32.9 30.0 - 36.0 g/dL   RDW 14.6 11.5 - 15.5 %   Platelets 184 150 - 400 K/uL    Comment: Performed at [facility], [place] [address], [place], [place] [address]  Magnesium     Status: None   Collection Time: 09/20/17 11:51 AM  Result Value Ref Range   Magnesium 2.2 1.7 - 2.4 mg/dL    Comment: Performed at [facility], [place] [address] [place],  [address]  Urinalysis, Routine w reflex microscopic     Status: Abnormal   Collection Time: 09/20/17  4:49 PM  Result Value Ref Range   Color, Urine YELLOW YELLOW   APPearance CLEAR CLEAR   Specific Gravity, Urine 1.011 1.005 - 1.030   pH 6.0 5.0 - 8.0   Glucose, UA NEGATIVE NEGATIVE mg/dL   Hgb urine dipstick NEGATIVE NEGATIVE   Bilirubin Urine NEGATIVE NEGATIVE   Ketones, ur NEGATIVE NEGATIVE mg/dL   Protein, ur NEGATIVE NEGATIVE mg/dL   Nitrite NEGATIVE NEGATIVE   Leukocytes, UA TRACE (A) NEGATIVE   RBC / HPF 0-5 0 - 5 RBC/hpf   WBC, UA 0-5 0 - 5 WBC/hpf   Bacteria, UA NONE SEEN NONE SEEN    Squamous Epithelial / LPF 0-5 0 - 5   Mucus PRESENT  Comment: Performed at [facility], [place] [address] [place], [place] [address]    Ct Abdomen Pelvis Wo Contrast  Result Date: 09/20/2017 CLINICAL DATA:  RIGHT upper quadrant tenderness, nausea and vomiting for 2 days. History of diverticulitis, bowel resection, splenectomy, appendectomy, hysterectomy, cholecystectomy. EXAM: CT ABDOMEN AND PELVIS WITHOUT CONTRAST TECHNIQUE: Multidetector CT imaging of the abdomen and pelvis was performed following the standard protocol without IV contrast. COMPARISON:  Pelvic ultrasound January 08, 2017 and CT abdomen and pelvis April 22, 2015 FINDINGS: LOWER CHEST: Lung bases are clear. The visualized heart size is normal. No pericardial effusion. Minimal retained versus refluxed contrast in distal esophagus. HEPATOBILIARY: Status post cholecystectomy. Negative noncontrast CT liver. PANCREAS: Normal. SPLEEN: Surgically absent. ADRENALS/URINARY TRACT: Kidneys are orthotopic, demonstrating normal size and morphology. No nephrolithiasis, hydronephrosis; limited assessment for renal masses on this nonenhanced examination. Known RIGHT upper pole cyst not conspicuous by noncontrast CT. The unopacified ureters are normal in course and caliber. Urinary bladder is partially distended and unremarkable. Normal adrenal glands. STOMACH/BOWEL: Sigmoid bowel anastomosis. Mild descending and residual sigmoid colonic diverticulosis. Moderate to large amount of retained large bowel stool. Mild circumferential RIGHT lower quadrant small bowel wall thickening (series 2, image 60), bowel is apposed to the anterior abdominal wall. Small bowel proximal to wall thickening is 3 cm. VASCULAR/LYMPHATIC: Aortoiliac vessels are normal in course and caliber. Mild calcific atherosclerosis. No lymphadenopathy by CT size criteria. REPRODUCTIVE: Homogeneously hypodense 3.4 cm LEFT ovarian cyst, relatively stable from 2016, likely  benign. Status post hysterectomy. OTHER: No intraperitoneal free fluid or free air. MUSCULOSKELETAL: Non-acute. Moderate spondylosis. Small calcified probable disc extrusion L1. Anterior abdominal wall scarring. IMPRESSION: 1. Short segment small bowel wall thickening RIGHT lower quadrant resulting in low-grade bowel obstruction, possible adhesion. Findings may also be infectious or inflammatory, less likely neoplastic. 2. Moderate amount of retained large bowel stool. Mild colonic diverticulosis. Aortic Atherosclerosis (ICD10-I70.0). Electronically Signed   By: Elon Alas M.D.   On: 09/20/2017 16:07    Review of Systems  Constitutional: Negative for chills and fever.  Respiratory: Negative for cough.   Cardiovascular: Negative for chest pain.  Gastrointestinal: Positive for abdominal pain, constipation, diarrhea, nausea and vomiting.  Genitourinary: Negative for dysuria.  Musculoskeletal: Negative for myalgias.  All other systems reviewed and are negative.  Blood pressure (!) 155/77, pulse 94, temperature 98.3 F (36.8 C), temperature source Oral, resp. rate 16, height _0  (1.575 m), weight 60.8 kg (134 lb), SpO2 95 %. Physical Exam  Constitutional: She is oriented to person, place, and time. She appears well-developed and well-nourished. No distress.  HENT:  Head: Normocephalic and atraumatic.  Right Ear: External ear normal.  Left Ear: External ear normal.  Nose: Nose normal.  Mouth/Throat: Oropharynx is clear and moist. No oropharyngeal exudate.  Eyes: Pupils are equal, round, and reactive to light. Right eye exhibits no discharge. Left eye exhibits no discharge. No scleral icterus.  Neck: Normal range of motion. No tracheal deviation present.  Cardiovascular: Normal rate, regular rhythm and normal heart sounds.  No murmur heard. Respiratory: Effort normal and breath sounds normal. No respiratory distress. She has no wheezes.  GI: Soft. She exhibits no distension. There is no  tenderness.  Musculoskeletal: Normal range of motion. She exhibits no edema or deformity.  Neurological: She is alert and oriented to person, place, and time.  Skin: Skin is warm. She is not diaphoretic. No erythema.  Psychiatric: Her behavior is normal. Judgment normal.    Assessment/Plan: Small bowel obstruction  This is actually minimal on CT scan.  This may actually be infectious.  I believe the bigger concern is the amount of constipation she has.  I believe she does not need a nasogastric tube currently.  We will repeat her abdominal films in the morning and if there is more abdominal distention we could consider a nasogastric tube and the small bowel protocol x-rays.  I believe it is okay for have clear liquids.  I would recommend enemas or at least suppositories to try to clear the colon of the stool.  We will follow her with you  Cinderella Christoffersen A 09/20/2017, 6:53 PM

## 2017-09-20 NOTE — ED Notes (Signed)
RN  Marylin Crosby 5W aware that we will bring patient up as soon as we have NG tube placed.

## 2017-09-20 NOTE — Telephone Encounter (Signed)
Spoke with pt to inform of MDs note. Pt verbalized understanding.

## 2017-09-20 NOTE — ED Notes (Addendum)
Pt is aware urine sample is needed. 

## 2017-09-20 NOTE — Telephone Encounter (Signed)
Sent - have her hold her lasix.  She needs to be careful not to get dehydrated.  She may need to hold the metoprolol if BP too low.  If no improvement she may need to go to ED

## 2017-09-20 NOTE — ED Notes (Signed)
ED TO INPATIENT HANDOFF REPORT  Name/Age/Gender Tamara Robertson 84 y.o. female  Code Status Code Status History    Date Active Date Inactive Code Status Order ID Comments User Context   06/25/2017 2134 07/09/2017 1720 Full Code [id number]  Etta Quill, DO ED   09/16/2014 1621 09/20/2014 1620 Full Code [id number]  Rolm Bookbinder, MD Inpatient    Advance Directive Documentation     Most Recent Value  Type of Advance Directive  Living will, Healthcare Power of Attorney  Pre-existing out of facility DNR order (yellow form or pink MOST form)  -  "MOST" Form in Place?  -      Home/SNF/Other Home  Chief Complaint Nausea; Emesis  Level of Care/Admitting Diagnosis ED Disposition    ED Disposition Condition [place]: [facility] [[id number]]  Level of Care: Med-Surg [16]  Diagnosis: SBO (small bowel obstruction) ([place]) [[id number]]  Admitting Physician: Eugenie Filler [[id number]]  Attending Physician: Eugenie Filler [[id number]]  Estimated length of stay: past midnight tomorrow  Certification:: I certify this patient will need inpatient services for at least 2 midnights  PT Class (Do Not Modify): Inpatient [101]  PT Acc Code (Do Not Modify): Private [1]       Medical History Past Medical History:  Diagnosis Date  . Anxiety   . CAD (coronary artery disease)    RCA 40% stenosis cath 01/2011  . Cholelithiasis    s/p lap chole 09/2014  . Diverticular stricture [facility]) 2006   [facility]  . DIVERTICULITIS, HX OF   . Diverticulosis   . DYSLIPIDEMIA   . Elevated LFTs   . GERD   . Glaucoma   . Hepatic steatosis   . HOH (hard of hearing)   . Immune thrombocytopenic purpura (HCC)    chronic - baseline 80-100K, on pred  . Irritable bowel syndrome   . Left ovarian cyst dx 01/2013 CT   working with gyn, ?malignant - elevated tumor marker OVA1  . OSTEOARTHRITIS, KNEE, RIGHT   . OSTEOPENIA   . OVERACTIVE BLADDER   . UNSPECIFIED  PERIPHERAL VASCULAR DISEASE   . URINARY INCONTINENCE     Allergies Allergies  Allergen Reactions  . Aspirin Other (See Comments)    ITP  . Sulfa Antibiotics Other (See Comments)    dizziness  . Contrast Media [Iodinated Diagnostic Agents] Hives  . Lasix [Furosemide] Rash  . Statins Other (See Comments)    IV Location/Drains/Wounds Patient Lines/Drains/Airways Status   Active Line/Drains/Airways    Name:   Placement date:   Placement time:   Site:   Days:   Peripheral IV 09/20/17 Right Antecubital   09/20/17    1111    Antecubital   less than 1   Incision (Closed) 09/16/14 Abdomen   09/16/14    1420     1100   Incision - 4 Ports Abdomen   09/16/14    -     1100          Labs/Imaging Results for orders placed or performed during the hospital encounter of 09/20/17 (from the past 48 hour(s))  Troponin I     Status: None   Collection Time: 09/20/17 11:50 AM  Result Value Ref Range   Troponin I <0.03 <0.03 ng/mL    Comment: Performed at [facility], [place] [address] [place], [place] [address]  Lipase, blood     Status: None   Collection Time: 09/20/17 11:51  AM  Result Value Ref Range   Lipase 23 11 - 51 U/L    Comment: Performed at [facility], [place] [address] [place], [place] [address]  Comprehensive metabolic panel     Status: Abnormal   Collection Time: 09/20/17 11:51 AM  Result Value Ref Range   Sodium 141 135 - 145 mmol/L   Potassium 3.8 3.5 - 5.1 mmol/L   Chloride 97 (L) 101 - 111 mmol/L   CO2 28 22 - 32 mmol/L   Glucose, Bld 114 (H) 65 - 99 mg/dL   BUN 30 (H) 6 - 20 mg/dL   Creatinine, Ser 0.99 0.44 - 1.00 mg/dL   Calcium 9.2 8.9 - 10.3 mg/dL   Total Protein 7.7 6.5 - 8.1 g/dL   Albumin 4.0 3.5 - 5.0 g/dL   AST 24 15 - 41 U/L   ALT 15 14 - 54 U/L   Alkaline Phosphatase 75 38 - 126 U/L   Total Bilirubin 0.8 0.3 - 1.2 mg/dL   GFR calc non Af Amer 50 (L) >60 mL/min   GFR calc Af Amer 58 (L) >60 mL/min    Comment:  (NOTE) The eGFR has been calculated using the CKD EPI equation. This calculation has not been validated in all clinical situations. eGFR's persistently <60 mL/min signify possible Chronic Kidney Disease.    Anion gap 16 (H) 5 - 15    Comment: Performed at [facility], [place] [address], [place], [place] [address]  CBC     Status: Abnormal   Collection Time: 09/20/17 11:51 AM  Result Value Ref Range   WBC 15.2 (H) 4.0 - 10.5 K/uL   RBC 5.03 3.87 - 5.11 MIL/uL   Hemoglobin 14.1 12.0 - 15.0 g/dL   HCT 42.8 36.0 - 46.0 %   MCV 85.1 78.0 - 100.0 fL   MCH 28.0 26.0 - 34.0 pg   MCHC 32.9 30.0 - 36.0 g/dL   RDW 14.6 11.5 - 15.5 %   Platelets 184 150 - 400 K/uL    Comment: Performed at [facility], [place] [address], [place], [place] [address]  Magnesium     Status: None   Collection Time: 09/20/17 11:51 AM  Result Value Ref Range   Magnesium 2.2 1.7 - 2.4 mg/dL    Comment: Performed at [facility], [place] [address], [place], [place] [address]  Urinalysis, Routine w reflex microscopic     Status: Abnormal   Collection Time: 09/20/17  4:49 PM  Result Value Ref Range   Color, Urine YELLOW YELLOW   APPearance CLEAR CLEAR   Specific Gravity, Urine 1.011 1.005 - 1.030   pH 6.0 5.0 - 8.0   Glucose, UA NEGATIVE NEGATIVE mg/dL   Hgb urine dipstick NEGATIVE NEGATIVE   Bilirubin Urine NEGATIVE NEGATIVE   Ketones, ur NEGATIVE NEGATIVE mg/dL   Protein, ur NEGATIVE NEGATIVE mg/dL   Nitrite NEGATIVE NEGATIVE   Leukocytes, UA TRACE (A) NEGATIVE   RBC / HPF 0-5 0 - 5 RBC/hpf   WBC, UA 0-5 0 - 5 WBC/hpf   Bacteria, UA NONE SEEN NONE SEEN   Squamous Epithelial / LPF 0-5 0 - 5   Mucus PRESENT     Comment: Performed at [facility], [place] [address] [place], [place] [address]   Ct Abdomen Pelvis Wo Contrast  Result Date: 09/20/2017 CLINICAL DATA:  RIGHT upper quadrant tenderness, nausea and vomiting for 2 days. History of  diverticulitis, bowel resection, splenectomy, appendectomy,  hysterectomy, cholecystectomy. EXAM: CT ABDOMEN AND PELVIS WITHOUT CONTRAST TECHNIQUE: Multidetector CT imaging of the abdomen and pelvis was performed following the standard protocol without IV contrast. COMPARISON:  Pelvic ultrasound January 08, 2017 and CT abdomen and pelvis April 22, 2015 FINDINGS: LOWER CHEST: Lung bases are clear. The visualized heart size is normal. No pericardial effusion. Minimal retained versus refluxed contrast in distal esophagus. HEPATOBILIARY: Status post cholecystectomy. Negative noncontrast CT liver. PANCREAS: Normal. SPLEEN: Surgically absent. ADRENALS/URINARY TRACT: Kidneys are orthotopic, demonstrating normal size and morphology. No nephrolithiasis, hydronephrosis; limited assessment for renal masses on this nonenhanced examination. Known RIGHT upper pole cyst not conspicuous by noncontrast CT. The unopacified ureters are normal in course and caliber. Urinary bladder is partially distended and unremarkable. Normal adrenal glands. STOMACH/BOWEL: Sigmoid bowel anastomosis. Mild descending and residual sigmoid colonic diverticulosis. Moderate to large amount of retained large bowel stool. Mild circumferential RIGHT lower quadrant small bowel wall thickening (series 2, image 60), bowel is apposed to the anterior abdominal wall. Small bowel proximal to wall thickening is 3 cm. VASCULAR/LYMPHATIC: Aortoiliac vessels are normal in course and caliber. Mild calcific atherosclerosis. No lymphadenopathy by CT size criteria. REPRODUCTIVE: Homogeneously hypodense 3.4 cm LEFT ovarian cyst, relatively stable from 2016, likely benign. Status post hysterectomy. OTHER: No intraperitoneal free fluid or free air. MUSCULOSKELETAL: Non-acute. Moderate spondylosis. Small calcified probable disc extrusion L1. Anterior abdominal wall scarring. IMPRESSION: 1. Short segment small bowel wall thickening RIGHT lower quadrant resulting in low-grade  bowel obstruction, possible adhesion. Findings may also be infectious or inflammatory, less likely neoplastic. 2. Moderate amount of retained large bowel stool. Mild colonic diverticulosis. Aortic Atherosclerosis (ICD10-I70.0). Electronically Signed   By: Elon Alas M.D.   On: 09/20/2017 16:07    Pending Labs Unresulted Labs (From admission, onward)   None      Vitals/Pain Today's Vitals   09/20/17 1430 09/20/17 1500 09/20/17 1530 09/20/17 1536  BP: (!) 161/67 138/66 (!) 155/77   Pulse: 86 100 94   Resp: 17 (!) 24 16   Temp:      TempSrc:      SpO2: 98% 100% 95%   Weight:      Height:      PainSc:    8     Isolation Precautions No active isolations  Medications Medications  lidocaine (LIDODERM) 5 % 1 patch (1 patch Transdermal Patch Applied 09/20/17 1536)  sodium chloride 0.9 % 1,000 mL with potassium chloride 40 mEq infusion (has no administration in time range)  metoprolol tartrate (LOPRESSOR) injection 2.5 mg (has no administration in time range)  sodium chloride 0.9 % bolus 1,000 mL (0 mLs Intravenous Stopped 09/20/17 1422)    Mobility walks

## 2017-09-20 NOTE — ED Notes (Signed)
Pt has experienced non-stop diarrhea since drinking oral contrast. Pt currently sitting on bedside commode

## 2017-09-20 NOTE — H&P (Addendum)
History and Physical    GRACELYNNE KUCHAR ZOX:[id number] DOB: [birth year] DOA: 09/20/2017  PCP: Pincus Sanes, MD  Patient coming from: Home  I have personally briefly reviewed patient's old medical records in [facility]  Chief Complaint: Nausea vomiting  HPI: Tamara Robertson is a 84 y.o. female with medical history significant of ITP on chronic prednisone, noncritical coronary artery disease, moderate aortic stenosis, status post cholecystectomy, appendectomy, hysterectomy, splenectomy, sigmoid colectomy, IBS who presented to the ED with a 2-day history of nausea and vomiting as well as lower abdominal cramping pain.  Patient states emesis was initially brownish and on the day of admission became more bilious.  Patient denies any hematemesis no hematochezia no melena.  Denies any fever, no chills, no chest pain, no wheezing, no syncope.  Patient states does have alternating constipation and diarrhea for over the past 10 years.  Patient denies any focal neurological deficits.  Patient does endorse some generalized weakness decreased oral intake.  She denies any dysuria.  [facility] were called to admit the patient for further evaluation and management. As I entered the room patient sitting on bedside commode having a bowel movement.  ED Course: Patient seen in the ED comprehensive metabolic profile with a chloride of 97 glucose of 114 otherwise is within normal limits.  Lipase level of 23.  Troponin I was less than 0.03.  EKG with normal sinus rhythm.  CBC had a white count of 15.2, platelet count of 184 otherwise was within normal limits.  CT abdomen and pelvis with a short segment small bowel wall thickening right lower quadrant resulted in a low-grade bowel obstruction, possible adhesion.  Findings may be infectious or inflammatory less likely neoplastic.  Moderate amount of retained large bowel stool.  Mild colonic diverticulosis.  ED consulted general surgery who recommended NG tube placement,  medical admission and will formally consult on the patient.  Review of Systems: As per HPI otherwise 10 point review of systems negative.   Past Medical History:  Diagnosis Date  . Anxiety   . CAD (coronary artery disease)    RCA 40% stenosis cath 01/2011  . Cholelithiasis    s/p lap chole 09/2014  . Diverticular stricture [facility]) 2006   [facility]  . DIVERTICULITIS, HX OF   . Diverticulosis   . DYSLIPIDEMIA   . Elevated LFTs   . GERD   . Glaucoma   . Hepatic steatosis   . HOH (hard of hearing)   . Immune thrombocytopenic purpura (HCC)    chronic - baseline 80-100K, on pred  . Irritable bowel syndrome   . Left ovarian cyst dx 01/2013 CT   working with gyn, ?malignant - elevated tumor marker OVA1  . OSTEOARTHRITIS, KNEE, RIGHT   . OSTEOPENIA   . OVERACTIVE BLADDER   . UNSPECIFIED PERIPHERAL VASCULAR DISEASE   . URINARY INCONTINENCE     Past Surgical History:  Procedure Laterality Date  . ABDOMINAL HYSTERECTOMY  1963  . ANGIOPLASTY    . APPENDECTOMY  1956  . CARDIAC CATHETERIZATION    . CATARACT EXTRACTION, BILATERAL  10/2010  . CHOLECYSTECTOMY N/A 09/16/2014   Procedure: LAPAROSCOPIC CHOLECYSTECTOMY ;  Surgeon: Emelia Loron, MD;  Location: [facility];  Service: General;  Laterality: N/A;  . KNEE ARTHROSCOPY Right   . L pop PTA  10/2009   stent  . LAPAROSCOPIC SIGMOID COLECTOMY  10/2005  . SPLENECTOMY  1954  . VARICOSE VEIN SURGERY Right 1962     reports  that she has never smoked. She has never used smokeless tobacco. She reports that she does not drink alcohol or use drugs.  Allergies  Allergen Reactions  . Aspirin Other (See Comments)    ITP  . Sulfa Antibiotics Other (See Comments)    dizziness  . Contrast Media [Iodinated Diagnostic Agents] Hives  . Lasix [Furosemide] Rash  . Statins Other (See Comments)    Family History  Problem Relation Age of Onset  . Coronary artery disease Mother   . Heart attack Mother 27  . Hyperlipidemia Mother    . Hypertension Mother   . Stomach cancer Father   . Hypertension Daughter   . Hyperlipidemia Daughter   . Arthritis Unknown        parent  . Transient ischemic attack Unknown        parent  . Colon cancer Neg Hx    Mother deceased age 34 from an acute MI.  Father deceased age 26 with a history of abdominal cancer.  Prior to Admission medications   Medication Sig Start Date End Date Taking? Authorizing Provider  acetaminophen (TYLENOL) 325 MG tablet Take 650 mg by mouth every 6 (six) hours as needed for fever.   Yes [provider]  Biotin 2500 MCG CAPS Take 1 capsule by mouth daily.    Yes [provider]  Calcium-Magnesium-Vitamin D (CITRACAL CALCIUM+D) 600-40-500 MG-MG-UNIT TB24 Take 1 tablet by mouth daily.    Yes [provider]  feeding supplement, ENSURE ENLIVE, (ENSURE ENLIVE) LIQD Take 237 mLs by mouth 2 (two) times daily between meals. 07/09/17  Yes Kathlen Mody, MD  Flaxseed MISC Take 5 mLs by mouth daily. Take 1 tsp daily   Yes [provider]  furosemide (LASIX) 20 MG tablet Take 1 tablet (20 mg total) by mouth daily. 09/26/16  Yes Marykay Lex, MD  latanoprost (XALATAN) 0.005 % ophthalmic solution Place 1 drop into both eyes at bedtime.   Yes [provider]  metoprolol tartrate (LOPRESSOR) 25 MG tablet Take 0.5 tablets (12.5 mg total) by mouth 2 (two) times daily. 08/22/17  Yes Kilroy, Eda Paschal, PA-C  Multiple Vitamin (MULTIVITAMIN) tablet Take 1 tablet by mouth daily.     Yes [provider]  ondansetron (ZOFRAN ODT) 4 MG disintegrating tablet Take 1 tablet (4 mg total) by mouth every 8 (eight) hours as needed for nausea or vomiting. 09/20/17  Yes Burns, Bobette Mo, MD  pantoprazole (PROTONIX) 20 MG tablet TAKE 1 TABLET BY MOUTH ONCE DAILY 04/08/17  Yes Pyrtle, Carie Caddy, MD  polyethylene glycol (MIRALAX / GLYCOLAX) packet Take 17 g by mouth daily as needed for moderate constipation.    Yes [provider]  predniSONE  (DELTASONE) 2.5 MG tablet TAKE ONE TABLET BY MOUTH ONCE DAILY 12/31/16  Yes Burns, Bobette Mo, MD  Probiotic Product (PROBIOTIC FORMULA PO) Take 1 tablet by mouth daily.     Yes [provider]  triamcinolone cream (KENALOG) 0.1 % Apply 1 application topically 2 (two) times daily. 09/11/17  Yes [provider]    Physical Exam: Vitals:   09/20/17 1400 09/20/17 1430 09/20/17 1500 09/20/17 1530  BP: (!) 146/60 (!) 161/67 138/66 (!) 155/77  Pulse: 91 86 100 94  Resp: 18 17 (!) 24 16  Temp:      TempSrc:      SpO2: 98% 98% 100% 95%  Weight:      Height:        Constitutional: NAD, calm, comfortable Vitals:  09/20/17 1400 09/20/17 1430 09/20/17 1500 09/20/17 1530  BP: (!) 146/60 (!) 161/67 138/66 (!) 155/77  Pulse: 91 86 100 94  Resp: 18 17 (!) 24 16  Temp:      TempSrc:      SpO2: 98% 98% 100% 95%  Weight:      Height:       Eyes: PERRLA, lids and conjunctivae normal ENMT: Mucous membranes are dry. Posterior pharynx clear of any exudate or lesions.Normal dentition.  Neck: normal, supple, no masses, no thyromegaly Respiratory: clear to auscultation bilaterally, no wheezing, no crackles. Normal respiratory effort. No accessory muscle use.  Cardiovascular: Regular rate and rhythm, no murmurs / rubs / gallops. No extremity edema. 2+ pedal pulses. No carotid bruits.  Abdomen: no tenderness, no masses palpated. No hepatosplenomegaly. Bowel sounds positive.  Musculoskeletal: no clubbing / cyanosis. No joint deformity upper and lower extremities. Good ROM, no contractures. Normal muscle tone.  Skin: no rashes, lesions, ulcers. No induration Neurologic: CN 2-12 grossly intact. Sensation intact, DTR normal. Strength 5/5 in all 4.  Psychiatric: Normal judgment and insight. Alert and oriented x 3. Normal mood.    Labs on Admission: I have personally reviewed following labs and imaging studies  CBC: Recent Labs  Lab 09/20/17 1151  WBC 15.2*  HGB 14.1  HCT 42.8  MCV 85.1   PLT 184   Basic Metabolic Panel: Recent Labs  Lab 09/20/17 1151  NA 141  K 3.8  CL 97*  CO2 28  GLUCOSE 114*  BUN 30*  CREATININE 0.99  CALCIUM 9.2  MG 2.2   GFR: Estimated Creatinine Clearance: 35 mL/min (by C-G formula based on SCr of 0.99 mg/dL). Liver Function Tests: Recent Labs  Lab 09/20/17 1151  AST 24  ALT 15  ALKPHOS 75  BILITOT 0.8  PROT 7.7  ALBUMIN 4.0   Recent Labs  Lab 09/20/17 1151  LIPASE 23   No results for input(s): AMMONIA in the last 168 hours. Coagulation Profile: No results for input(s): INR, PROTIME in the last 168 hours. Cardiac Enzymes: Recent Labs  Lab 09/20/17 1150  TROPONINI <0.03   BNP (last 3 results) No results for input(s): PROBNP in the last 8760 hours. HbA1C: No results for input(s): HGBA1C in the last 72 hours. CBG: No results for input(s): GLUCAP in the last 168 hours. Lipid Profile: No results for input(s): CHOL, HDL, LDLCALC, TRIG, CHOLHDL, LDLDIRECT in the last 72 hours. Thyroid Function Tests: No results for input(s): TSH, T4TOTAL, FREET4, T3FREE, THYROIDAB in the last 72 hours. Anemia Panel: No results for input(s): VITAMINB12, FOLATE, FERRITIN, TIBC, IRON, RETICCTPCT in the last 72 hours. Urine analysis:    Component Value Date/Time   COLORURINE YELLOW 09/20/2017 1649   APPEARANCEUR CLEAR 09/20/2017 1649   LABSPEC 1.011 09/20/2017 1649   PHURINE 6.0 09/20/2017 1649   GLUCOSEU NEGATIVE 09/20/2017 1649   GLUCOSEU NEGATIVE 04/26/2016 1121   HGBUR NEGATIVE 09/20/2017 1649   BILIRUBINUR NEGATIVE 09/20/2017 1649   KETONESUR NEGATIVE 09/20/2017 1649   PROTEINUR NEGATIVE 09/20/2017 1649   UROBILINOGEN 0.2 04/26/2016 1121   NITRITE NEGATIVE 09/20/2017 1649   LEUKOCYTESUR TRACE (A) 09/20/2017 1649    Radiological Exams on Admission: Ct Abdomen Pelvis Wo Contrast  Result Date: 09/20/2017 CLINICAL DATA:  RIGHT upper quadrant tenderness, nausea and vomiting for 2 days. History of diverticulitis, bowel  resection, splenectomy, appendectomy, hysterectomy, cholecystectomy. EXAM: CT ABDOMEN AND PELVIS WITHOUT CONTRAST TECHNIQUE: Multidetector CT imaging of the abdomen and pelvis was performed following the standard protocol without  IV contrast. COMPARISON:  Pelvic ultrasound January 08, 2017 and CT abdomen and pelvis April 22, 2015 FINDINGS: LOWER CHEST: Lung bases are clear. The visualized heart size is normal. No pericardial effusion. Minimal retained versus refluxed contrast in distal esophagus. HEPATOBILIARY: Status post cholecystectomy. Negative noncontrast CT liver. PANCREAS: Normal. SPLEEN: Surgically absent. ADRENALS/URINARY TRACT: Kidneys are orthotopic, demonstrating normal size and morphology. No nephrolithiasis, hydronephrosis; limited assessment for renal masses on this nonenhanced examination. Known RIGHT upper pole cyst not conspicuous by noncontrast CT. The unopacified ureters are normal in course and caliber. Urinary bladder is partially distended and unremarkable. Normal adrenal glands. STOMACH/BOWEL: Sigmoid bowel anastomosis. Mild descending and residual sigmoid colonic diverticulosis. Moderate to large amount of retained large bowel stool. Mild circumferential RIGHT lower quadrant small bowel wall thickening (series 2, image 60), bowel is apposed to the anterior abdominal wall. Small bowel proximal to wall thickening is 3 cm. VASCULAR/LYMPHATIC: Aortoiliac vessels are normal in course and caliber. Mild calcific atherosclerosis. No lymphadenopathy by CT size criteria. REPRODUCTIVE: Homogeneously hypodense 3.4 cm LEFT ovarian cyst, relatively stable from 2016, likely benign. Status post hysterectomy. OTHER: No intraperitoneal free fluid or free air. MUSCULOSKELETAL: Non-acute. Moderate spondylosis. Small calcified probable disc extrusion L1. Anterior abdominal wall scarring. IMPRESSION: 1. Short segment small bowel wall thickening RIGHT lower quadrant resulting in low-grade bowel obstruction,  possible adhesion. Findings may also be infectious or inflammatory, less likely neoplastic. 2. Moderate amount of retained large bowel stool. Mild colonic diverticulosis. Aortic Atherosclerosis (ICD10-I70.0). Electronically Signed   By: Awilda Metro M.D.   On: 09/20/2017 16:07    EKG: Independently reviewed.  Normal sinus rhythm  Assessment/Plan Principal Problem:   SBO (small bowel obstruction) (HCC) Active Problems:   Hyperlipidemia   IMMUNE THROMBOCYTOPENIC PURPURA   Unspecified glaucoma   GERD   Irritable bowel syndrome   PVD (peripheral vascular disease) (HCC)   Anxiety   Hypertension   Bilateral hearing loss   Chronic lower back pain   Current chronic use of systemic steroids   Anemia   CAD (coronary artery disease)   Aortic stenosis, moderate   1 low-grade small bowel obstruction Patient presented with nausea vomiting and diffuse cramping abdominal pain.  States her last bowel movement was 3 days prior to admission.  Patient noted on presentation to have some bilious emesis per patient.  CT abdomen and pelvis which was done consistent with low-grade small bowel obstruction possibly adhesion and moderate amount of retained large bowel stool.  Mild colonic diverticulosis.  On my interview patient sitting on bedside commode having a bowel movement.  Denies any further abdominal pain.  We will keep on bowel rest/n.p.o.  IV fluids.  Antiemetics.  Supportive care.  Patient may not need NG tube however patient to be assessed by general surgery and will defer NG tube placement to general surgery.  Serial abdominal films.  Keep potassium greater than 4.  Keep magnesium greater than 2.  General surgery consultation pending.  Follow.  2.  Leukocytosis Likely a reactive leukocytosis.  Patient afebrile.  Denies any urinary symptoms.  No respiratory symptoms.  Due to patient's nausea and vomiting will get a chest x-ray to rule out aspiration pneumonia.  Urinalysis unremarkable.  Hold off  on any antibiotics at this time.  Follow.  3.  Gastroesophageal reflux disease IV Pepcid.  4.  Hypertension IV Lopressor.  5.  Moderate aortic stenosis/coronary artery disease Currently stable.  Patient asymptomatic.  IV Lopressor.  6.  History of ITP Patient chronically on low-dose  prednisone 2.5 mg daily.  Will place on IV Solu-Medrol 20 mg daily during the hospitalization and quickly taper once patient is started on oral diet and tolerating oral intake.  7.  Chronic pain Continue home regimen of Lidoderm patch.  8.  Glaucoma Continue eyedrops.   DVT prophylaxis: SCDs Code Status: DNR Family Communication: Updated patient and daughter at bedside. Disposition Plan: To be determined. Consults called: General surgery per EDP: Dr. Magnus Ivan Admission status: Admit to inpatient.   Ramiro Harvest MD [facility] Pager [phone]  If 7PM-7AM, please contact night-coverage [email or web address] Password [facility]  09/20/2017, 6:43 PM

## 2017-09-20 NOTE — ED Provider Notes (Signed)
[facility] DEPT Provider Note   CSN: [id number] Arrival date & time: 09/20/17  1058     History   Chief Complaint Chief Complaint  Patient presents with  . Abdominal Pain  . Emesis    HPI Tamara Robertson is a 84 y.o. female who presents with nausea and vomiting.  Past medical history significant for coronary artery disease, history of diverticulosis, GERD, history of pneumonia.  Past surgical history significant for cholecystectomy, appendectomy, hysterectomy, splenectomy, sigmoid colectomy.  She states that for the past several days she has had intermittent nausea and vomiting with epigastric pain.  Today it became dark and bilious.  She also feels dehydrated.  She called EMS who gave Zofran which improved her symptoms.  She denies fever, chills, chest pain, shortness of breath, flank pain, diarrhea, constipation, urinary symptoms.  HPI  Past Medical History:  Diagnosis Date  . Anxiety   . CAD (coronary artery disease)    RCA 40% stenosis cath 01/2011  . Cholelithiasis    s/p lap chole 09/2014  . Diverticular stricture [facility]) 2006   [facility]  . DIVERTICULITIS, HX OF   . Diverticulosis   . DYSLIPIDEMIA   . Elevated LFTs   . GERD   . Glaucoma   . Hepatic steatosis   . HOH (hard of hearing)   . Immune thrombocytopenic purpura (HCC)    chronic - baseline 80-100K, on pred  . Irritable bowel syndrome   . Left ovarian cyst dx 01/2013 CT   working with gyn, ?malignant - elevated tumor marker OVA1  . OSTEOARTHRITIS, KNEE, RIGHT   . OSTEOPENIA   . OVERACTIVE BLADDER   . UNSPECIFIED PERIPHERAL VASCULAR DISEASE   . URINARY INCONTINENCE     Patient Active Problem List   Diagnosis Date Noted  . Aortic stenosis, moderate 08/22/2017  . CAD (coronary artery disease) 08/21/2017  . Anemia 07/23/2017  . Pleural effusion on right   . Acute respiratory failure with hypoxia ([place])   . Streptococcal pneumonia ([place]) 06/26/2017  .  Dehydration 06/25/2017  . Community acquired pneumonia of right lower lobe of lung ([place]) 06/25/2017  . Fever 06/25/2017  . Cough 06/25/2017  . Severe sepsis with acute organ dysfunction ([place]) 06/25/2017  . AKI (acute kidney injury) ([place]) 06/25/2017  . Current chronic use of systemic steroids 06/25/2017  . Chronic lower back pain 05/14/2017  . Varicose veins of left lower extremity with complications 57/32/2025  . Bilateral hearing loss 02/01/2017  . Hypertension 07/31/2016  . Leg edema 07/31/2016  . Prediabetes 06/05/2016  . Osteopenia 12/01/2015  . Anxiety 07/19/2015  . Degenerative cervical disc 12/28/2014  . Left ovarian cyst   . Peripheral neuropathy 06/03/2012  . Insomnia   . PVD (peripheral vascular disease) ([place]) 11/23/2010  . Bursitis of hip 10/26/2010  . Hyperlipidemia 06/27/2010  . IMMUNE THROMBOCYTOPENIC PURPURA 06/27/2010  . GLUCOMA 06/27/2010  . GERD 06/27/2010  . IRRITABLE BOWEL SYNDROME 06/27/2010  . OVERACTIVE BLADDER 06/27/2010  . OSTEOARTHRITIS, KNEE, RIGHT 06/27/2010  . URINARY INCONTINENCE 06/27/2010    Past Surgical History:  Procedure Laterality Date  . ABDOMINAL HYSTERECTOMY  1963  . ANGIOPLASTY    . APPENDECTOMY  1956  . CARDIAC CATHETERIZATION    . CATARACT EXTRACTION, BILATERAL  10/2010  . CHOLECYSTECTOMY N/A 09/16/2014   Procedure: LAPAROSCOPIC CHOLECYSTECTOMY ;  Surgeon: Rolm Bookbinder, MD;  Location: [place];  Service: General;  Laterality: N/A;  . KNEE ARTHROSCOPY Right   . L pop PTA  10/2009   stent  . LAPAROSCOPIC SIGMOID COLECTOMY  10/2005  . SPLENECTOMY  1954  . VARICOSE VEIN SURGERY Right 1962     OB History   None      Home Medications    Prior to Admission medications   Medication Sig Start Date End Date Taking? Authorizing Provider  acetaminophen (TYLENOL) 325 MG tablet Take 650 mg by mouth every 6 (six) hours as needed for fever.    [provider]  Biotin 2500 MCG CAPS Take 1 capsule by mouth daily.     [provider]  Calcium-Magnesium-Vitamin D (CITRACAL CALCIUM+D) 600-40-500 MG-MG-UNIT TB24 Take 1 tablet by mouth daily.     [provider]  feeding supplement, ENSURE ENLIVE, (ENSURE ENLIVE) LIQD Take 237 mLs by mouth 2 (two) times daily between meals. 07/09/17   Hosie Poisson, MD  Flaxseed MISC Take 5 mLs by mouth daily. Take 1 tsp daily    [provider]  furosemide (LASIX) 20 MG tablet Take 1 tablet (20 mg total) by mouth daily. 09/26/16   Leonie Man, MD  latanoprost (XALATAN) 0.005 % ophthalmic solution Place 1 drop into both eyes at bedtime.    [provider]  metoprolol tartrate (LOPRESSOR) 25 MG tablet Take 0.5 tablets (12.5 mg total) by mouth 2 (two) times daily. 08/22/17   Erlene Quan, PA-C  Multiple Vitamin (MULTIVITAMIN) tablet Take 1 tablet by mouth daily.      [provider]  ondansetron (ZOFRAN ODT) 4 MG disintegrating tablet Take 1 tablet (4 mg total) by mouth every 8 (eight) hours as needed for nausea or vomiting. 09/20/17   Binnie Rail, MD  pantoprazole (PROTONIX) 20 MG tablet TAKE 1 TABLET BY MOUTH ONCE DAILY 04/08/17   Pyrtle, Lajuan Lines, MD  polyethylene glycol (MIRALAX / GLYCOLAX) packet Take 17 g by mouth daily as needed for moderate constipation.     [provider]  predniSONE (DELTASONE) 2.5 MG tablet TAKE ONE TABLET BY MOUTH ONCE DAILY 12/31/16   Binnie Rail, MD  Probiotic Product (PROBIOTIC FORMULA PO) Take 1 tablet by mouth daily.      [provider]    Family History Family History  Problem Relation Age of Onset  . Coronary artery disease Mother   . Heart attack Mother 69  . Hyperlipidemia Mother   . Hypertension Mother   . Stomach cancer Father   . Hypertension Daughter   . Hyperlipidemia Daughter   . Arthritis Unknown        parent  . Transient ischemic attack Unknown        parent  . Colon cancer Neg Hx     Social History Social History   Tobacco Use  . Smoking status: Never Smoker  .  Smokeless tobacco: Never Used  Substance Use Topics  . Alcohol use: No    Alcohol/week: 0.0 oz    Comment: rarely  . Drug use: No     Allergies   Aspirin; Sulfa antibiotics; Contrast media [iodinated diagnostic agents]; Lasix [furosemide]; and Statins   Review of Systems Review of Systems  Constitutional: Negative for chills and fever.  Respiratory: Negative for shortness of breath.   Cardiovascular: Negative for chest pain.  Gastrointestinal: Positive for abdominal pain, nausea and vomiting. Negative for diarrhea.  Genitourinary: Negative for dysuria, flank pain and frequency.  All other systems reviewed and are negative.    Physical Exam Updated Vital Signs BP (!) 146/57 (BP Location: Right Arm)  Pulse 97   Temp 98.3 F (36.8 C) (Oral)   Resp 16   Ht 5\' 2"  (1.575 m)   Wt 60.8 kg (134 lb)   SpO2 99%   BMI 24.51 kg/m   Physical Exam  Constitutional: She is oriented to person, place, and time. She appears well-developed and well-nourished. No distress.  Calm, cooperative, well-appearing  HENT:  Head: Normocephalic and atraumatic.  Eyes: Pupils are equal, round, and reactive to light. Conjunctivae are normal. Right eye exhibits no discharge. Left eye exhibits no discharge. No scleral icterus.  Neck: Normal range of motion.  Cardiovascular: Normal rate and regular rhythm.  Pulmonary/Chest: Effort normal and breath sounds normal. No respiratory distress.  Abdominal: Soft. Bowel sounds are normal. She exhibits no distension and no mass. There is tenderness (Mild periumbilical). There is no rebound and no guarding. No hernia.  Multiple surgical scars  Neurological: She is alert and oriented to person, place, and time.  Skin: Skin is warm and dry.  Psychiatric: She has a normal mood and affect. Her behavior is normal.  Nursing note and vitals reviewed.    ED Treatments / Results  Labs (all labs ordered are listed, but only abnormal results are displayed) Labs  Reviewed  COMPREHENSIVE METABOLIC PANEL - Abnormal; Notable for the following components:      Result Value   Chloride 97 (*)    Glucose, Bld 114 (*)    BUN 30 (*)    GFR calc non Af Amer 50 (*)    GFR calc Af Amer 58 (*)    Anion gap 16 (*)    All other components within normal limits  CBC - Abnormal; Notable for the following components:   WBC 15.2 (*)    All other components within normal limits  LIPASE, BLOOD  TROPONIN I  URINALYSIS, ROUTINE W REFLEX MICROSCOPIC    EKG None  Radiology No results found.  Procedures Procedures (including critical care time)  Medications Ordered in ED Medications  lidocaine (LIDODERM) 5 % 1 patch (1 patch Transdermal Patch Applied 09/20/17 1536)  sodium chloride 0.9 % bolus 1,000 mL (0 mLs Intravenous Stopped 09/20/17 1422)     Initial Impression / Assessment and Plan / ED Course  I have reviewed the triage vital signs and the nursing notes.  Pertinent labs & imaging results that were available during my care of the patient were reviewed by me and considered in my medical decision making (see chart for details).  84 year old presents with epigastric pain, N/V. She is mildly hypertensive but otherwise vitals are normal. Her abdomen is soft and minimally tender in the periumbilical area. CBC is remarkable for leukocytosis of 15.2. CMP is remarkable for elevated BUN (30), elevated anion gap (16). UA is pending. EKG is NSR and Troponin is normal. CT was ordered due to her age and multiple abdominal surgeries. She was able to tolerate oral contrast. CT is pending at shift change. Care transferred to [facility] PA-C who will dispo accordingly.  Final Clinical Impressions(s) / ED Diagnoses   Final diagnoses:  Non-intractable vomiting with nausea, unspecified vomiting type    ED Discharge Orders    None       Recardo Evangelist, PA-C 09/20/17 1607    Lacretia Leigh, MD 09/22/17 [phone]

## 2017-09-20 NOTE — ED Notes (Signed)
Bed: WA02 Expected date:  Expected time:  Means of arrival:  Comments: EMS-N/V 

## 2017-09-20 NOTE — ED Provider Notes (Addendum)
Received patient at signout from [facility].  Refer to provider note for full history and physical examination.  Briefly, patient is an 84 year old female with history of multiple abdominal surgeries who presents today for a few days of nausea and vomiting as well as intermittent epigastric abdominal pain.  No urinary symptoms.  She does have IBS at baseline and has intermittent constipation and diarrhea.  Pending CT scan for rule out of bowel obstruction.  If work-up is reassuring, patient is likely stable for discharge home with follow-up with her primary care physician and Zofran.  Physical Exam  BP 138/66   Pulse 100   Temp 98.3 F (36.8 C) (Oral)   Resp (!) 24   Ht 5\' 2"  (1.575 m)   Wt 60.8 kg (134 lb)   SpO2 100%   BMI 24.51 kg/m   Physical Exam  Constitutional: She appears well-developed and well-nourished. No distress.  HENT:  Head: Normocephalic and atraumatic.  Eyes: Conjunctivae are normal. Right eye exhibits no discharge. Left eye exhibits no discharge.  Neck: No JVD present. No tracheal deviation present.  Cardiovascular: Normal rate.  Pulmonary/Chest: Effort normal.  Abdominal: Soft. She exhibits no distension. Bowel sounds are decreased. There is tenderness in the epigastric area.  Musculoskeletal: She exhibits no edema.  Neurological: She is alert.  Skin: Skin is warm and dry. No erythema.  Psychiatric: She has a normal mood and affect. Her behavior is normal.  Nursing note and vitals reviewed.   ED Course/Procedures     Procedures  MDM  IMPRESSION: 1. Short segment small bowel wall thickening RIGHT lower quadrant resulting in low-grade bowel obstruction, possible adhesion. Findings may also be infectious or inflammatory, less likely neoplastic. 2. Moderate amount of retained large bowel stool. Mild colonic Diverticulosis.  5:35 PM Spoke with Dr. Ninfa Linden who recommends NG tube and medical admission. Patient is resting comfortably, continues to have mild  epigastric pain with overall benign exam.  5:58 PM Spoke with Dr. Grandville Silos who agrees to assume care of patient and bring her in to the hospital for further evaluation and management.     Debroah Baller 09/20/17 1739    Mesner, Corene Cornea, MD 09/20/17 1748    Renita Papa, PA-C 09/20/17 Darlin Priestly, MD 09/20/17 1910

## 2017-09-21 ENCOUNTER — Inpatient Hospital Stay (HOSPITAL_COMMUNITY): Payer: Medicare HMO

## 2017-09-21 DIAGNOSIS — K219 Gastro-esophageal reflux disease without esophagitis: Secondary | ICD-10-CM

## 2017-09-21 LAB — BASIC METABOLIC PANEL
Anion gap: 10 (ref 5–15)
BUN: 19 mg/dL (ref 6–20)
CALCIUM: 8.3 mg/dL — AB (ref 8.9–10.3)
CO2: 23 mmol/L (ref 22–32)
CREATININE: 0.75 mg/dL (ref 0.44–1.00)
Chloride: 106 mmol/L (ref 101–111)
GFR calc Af Amer: >60 mL/min (ref >60)
GFR calc non Af Amer: >60 mL/min (ref >60)
GLUCOSE: 126 mg/dL — AB (ref 65–99)
Potassium: 4.4 mmol/L (ref 3.5–5.1)
Sodium: 139 mmol/L (ref 135–145)

## 2017-09-21 LAB — CBC
HCT: 37.6 % (ref 36.0–46.0)
Hemoglobin: 12.3 g/dL (ref 12.0–15.0)
MCH: 28 pg (ref 26.0–34.0)
MCHC: 32.7 g/dL (ref 30.0–36.0)
MCV: 85.6 fL (ref 78.0–100.0)
PLATELETS: 158 10*3/uL (ref 150–400)
RBC: 4.39 MIL/uL (ref 3.87–5.11)
RDW: 14.4 % (ref 11.5–15.5)
WBC: 9.7 10*3/uL (ref 4.0–10.5)

## 2017-09-21 LAB — PHOSPHORUS: Phosphorus: 3.4 mg/dL (ref 2.5–4.6)

## 2017-09-21 MED ORDER — FAMOTIDINE 20 MG PO TABS
20.0000 mg | ORAL_TABLET | Freq: Two times a day (BID) | ORAL | Status: DC
  Administered 2017-09-21 – 2017-09-22 (×2): 20 mg via ORAL
  Filled 2017-09-21 (×2): qty 1

## 2017-09-21 MED ORDER — PREDNISONE 10 MG PO TABS
10.0000 mg | ORAL_TABLET | Freq: Every day | ORAL | Status: DC
  Administered 2017-09-21 – 2017-09-22 (×2): 10 mg via ORAL
  Filled 2017-09-21 (×2): qty 1

## 2017-09-21 MED ORDER — METOPROLOL TARTRATE 12.5 MG HALF TABLET
12.5000 mg | ORAL_TABLET | Freq: Two times a day (BID) | ORAL | Status: DC
  Administered 2017-09-21 – 2017-09-22 (×2): 12.5 mg via ORAL
  Filled 2017-09-21 (×2): qty 1

## 2017-09-21 NOTE — Progress Notes (Signed)
PHARMACIST - PHYSICIAN COMMUNICATION   CONCERNING: IV to Oral Route Change Policy  RECOMMENDATION: This patient is receiving pepcid by the intravenous route.  Based on criteria approved by the Pharmacy and Therapeutics Committee, the intravenous medication(s) is/are being converted to the equivalent oral dose form(s).   DESCRIPTION: These criteria include:  The patient is eating (either orally or via tube) and/or has been taking other orally administered medications for a least 24 hours  The patient has no evidence of active gastrointestinal bleeding or impaired GI absorption (gastrectomy, short bowel, patient on TNA or NPO).  If you have questions about this conversion, please contact the Pharmacy Department  []   [phone] )  [facility] []   [phone] )  [facility] []   [phone] )  [facility] []   [phone] )  [facility] [x]   [phone] )  [place], [facility] 09/21/2017 12:33 PM

## 2017-09-21 NOTE — Evaluation (Signed)
Physical Therapy Evaluation Patient Details Name: Tamara Robertson MRN: [id number] DOB: 03/22/1930 Today's Date: 09/21/2017   History of Present Illness  84 y/o admitted with SBO with intermittent nausea and vomiting.  Clinical Impression  Pt moving well and independent will aspects of mobility.  No skilled PT needs identified and will sign off. Thank you for the referral.    Follow Up Recommendations No PT follow up    Equipment Recommendations  None recommended by PT    Recommendations for Other Services       Precautions / Restrictions        Mobility  Bed Mobility Overal bed mobility: Modified Independent             General bed mobility comments: HOB elevated  Transfers Overall transfer level: Modified independent     Sit to Stand: Modified independent (Device/Increase time)            Ambulation/Gait Ambulation/Gait assistance: Modified independent (Device/Increase time) Ambulation Distance (Feet): 350 Feet Assistive device: None Gait Pattern/deviations: Step-through pattern     General Gait Details: Pt ambulated with good step length and no difficulty noted.  In room, she was able to maneuver in tight spaces with backwards steps with no loss of balance.  Stairs            Wheelchair Mobility    Modified Rankin (Stroke Patients Only)       Balance Overall balance assessment: No apparent balance deficits (not formally assessed)                                           Pertinent Vitals/Pain Pain Assessment: No/denies pain    Home Living Family/patient expects to be discharged to:: Private residence Living Arrangements: Alone Available Help at Discharge: Family;Available PRN/intermittently Type of Home: House Home Access: Level entry     Home Layout: One level Home Equipment: Cane - single point;Walker - 4 wheels;Walker - 2 wheels Additional Comments: Pt is looking at independent living apartments in [place]  commun[place]    Prior Function                 Hand Dominance        Extremity/Trunk Assessment   Upper Extremity Assessment Upper Extremity Assessment: Overall WFL for tasks assessed    Lower Extremity Assessment Lower Extremity Assessment: Overall WFL for tasks assessed       Communication   Communication: No difficulties  Cognition Arousal/Alertness: Awake/alert Behavior During Therapy: WFL for tasks assessed/performed Overall Cognitive Status: Within Functional Limits for tasks assessed                                        General Comments      Exercises     Assessment/Plan    PT Assessment Patent does not need any further PT services  PT Problem List         PT Treatment Interventions      PT Goals (Current goals can be found in the Care Plan section)  Acute Rehab PT Goals Patient Stated Goal: to return to volunteering at [facility] 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 PT Goal Formulation: All assessment and education complete, DC therapy    Frequency     Barriers to discharge        Co-evaluation  AM-PAC PT "6 Clicks" Daily Activity  Outcome Measure Difficulty turning over in bed (including adjusting bedclothes, sheets and blankets)?: None Difficulty moving from lying on back to sitting on the side of the bed? : None Difficulty sitting down on and standing up from a chair with arms (e.g., wheelchair, bedside commode, etc,.)?: None Help needed moving to and from a bed to chair (including a wheelchair)?: None Help needed walking in hospital room?: None Help needed climbing 3-5 steps with a railing? : A Little 6 Click Score: 23    End of Session Equipment Utilized During Treatment: Gait belt Activity Tolerance: Patient tolerated treatment well Patient left: in chair;with call bell/phone within reach Nurse Communication: Mobility status PT Visit Diagnosis: Muscle weakness (generalized) (M62.81)    Time: 4540-9811 PT Time  Calculation (min) (ACUTE ONLY): 18 min   Charges:   PT Evaluation $PT Eval Low Complexity: 1 Low     PT G Codes:        [name] L. [name], [place] Pager [phone] 09/21/2017   Enzo Montgomery 09/21/2017, 9:07 AM

## 2017-09-21 NOTE — Progress Notes (Signed)
Subjective/Chief Complaint: nausea  Pt denies nausea vomiting or abdominal pain  Multiple loos BM some blood with wiping but not in stool feels fine    Objective: Vital signs in last 24 hours: Temp:  [98 F (36.7 C)-98.3 F (36.8 C)] 98.2 F (36.8 C) (05/18 0537) Pulse Rate:  [86-106] 89 (05/18 0537) Resp:  [15-24] 15 (05/18 0537) BP: (136-173)/(57-77) 136/73 (05/18 0537) SpO2:  [95 %-100 %] 96 % (05/18 0537) Weight:  [60.8 kg (134 lb)-63.1 kg (139 lb 1.8 oz)] 63.1 kg (139 lb 1.8 oz) (05/18 0537)    Intake/Output from previous day: 05/17 0701 - 05/18 0700 In: 1000 [IV Piggyback:1000] Out: -  Intake/Output this shift: No intake/output data recorded.  General appearance: alert and cooperative GI: soft, non-tender; bowel sounds normal; no masses,  no organomegaly Neurologic: Grossly normal  Lab Results:  Recent Labs    09/20/17 1151 09/21/17 0434  WBC 15.2* 9.7  HGB 14.1 12.3  HCT 42.8 37.6  PLT 184 158   BMET Recent Labs    09/20/17 1151 09/21/17 0434  NA 141 139  K 3.8 4.4  CL 97* 106  CO2 28 23  GLUCOSE 114* 126*  BUN 30* 19  CREATININE 0.99 0.75  CALCIUM 9.2 8.3*   PT/INR No results for input(s): LABPROT, INR in the last 72 hours. ABG No results for input(s): PHART, HCO3 in the last 72 hours.  Invalid input(s): PCO2, PO2  Studies/Results: Ct Abdomen Pelvis Wo Contrast  Result Date: 09/20/2017 CLINICAL DATA:  RIGHT upper quadrant tenderness, nausea and vomiting for 2 days. History of diverticulitis, bowel resection, splenectomy, appendectomy, hysterectomy, cholecystectomy. EXAM: CT ABDOMEN AND PELVIS WITHOUT CONTRAST TECHNIQUE: Multidetector CT imaging of the abdomen and pelvis was performed following the standard protocol without IV contrast. COMPARISON:  Pelvic ultrasound January 08, 2017 and CT abdomen and pelvis April 22, 2015 FINDINGS: LOWER CHEST: Lung bases are clear. The visualized heart size is normal. No pericardial effusion. Minimal  retained versus refluxed contrast in distal esophagus. HEPATOBILIARY: Status post cholecystectomy. Negative noncontrast CT liver. PANCREAS: Normal. SPLEEN: Surgically absent. ADRENALS/URINARY TRACT: Kidneys are orthotopic, demonstrating normal size and morphology. No nephrolithiasis, hydronephrosis; limited assessment for renal masses on this nonenhanced examination. Known RIGHT upper pole cyst not conspicuous by noncontrast CT. The unopacified ureters are normal in course and caliber. Urinary bladder is partially distended and unremarkable. Normal adrenal glands. STOMACH/BOWEL: Sigmoid bowel anastomosis. Mild descending and residual sigmoid colonic diverticulosis. Moderate to large amount of retained large bowel stool. Mild circumferential RIGHT lower quadrant small bowel wall thickening (series 2, image 60), bowel is apposed to the anterior abdominal wall. Small bowel proximal to wall thickening is 3 cm. VASCULAR/LYMPHATIC: Aortoiliac vessels are normal in course and caliber. Mild calcific atherosclerosis. No lymphadenopathy by CT size criteria. REPRODUCTIVE: Homogeneously hypodense 3.4 cm LEFT ovarian cyst, relatively stable from 2016, likely benign. Status post hysterectomy. OTHER: No intraperitoneal free fluid or free air. MUSCULOSKELETAL: Non-acute. Moderate spondylosis. Small calcified probable disc extrusion L1. Anterior abdominal wall scarring. IMPRESSION: 1. Short segment small bowel wall thickening RIGHT lower quadrant resulting in low-grade bowel obstruction, possible adhesion. Findings may also be infectious or inflammatory, less likely neoplastic. 2. Moderate amount of retained large bowel stool. Mild colonic diverticulosis. Aortic Atherosclerosis (ICD10-I70.0). Electronically Signed   By: Elon Alas M.D.   On: 09/20/2017 16:07   Dg Chest 2 View  Result Date: 09/20/2017 CLINICAL DATA:  RIGHT upper quadrant tenderness, nausea and vomiting for 2 days. EXAM: CHEST - 2 VIEW  COMPARISON:  Chest  radiograph July 23, 2017 FINDINGS: Cardiomediastinal silhouette is normal. No pleural effusions or focal consolidations. Trachea projects midline and there is no pneumothorax. Soft tissue planes and included osseous structures are non-suspicious. Osteopenia. IMPRESSION: No active cardiopulmonary disease. Electronically Signed   By: Elon Alas M.D.   On: 09/20/2017 19:24   Dg Abd 2 Views  Result Date: 09/21/2017 CLINICAL DATA:  Abdominal distention EXAM: ABDOMEN - 2 VIEW COMPARISON:  Yesterday FINDINGS: Distended small bowel loop in the left abdomen is again noted, measuring up to nearly 4 cm in diameter. Oral contrast is seen throughout the nondistended colon. No concerning mass effect or gas collection. The visualized lungs are clear IMPRESSION: 1. Partial small bowel obstruction with unchanged small bowel dilatation. 2. Previously administered oral contrast reaches the rectum. Electronically Signed   By: Monte Fantasia M.D.   On: 09/21/2017 08:20    Anti-infectives: Anti-infectives (From admission, onward)   None      Assessment/Plan: Patient Active Problem List   Diagnosis Date Noted  . SBO (small bowel obstruction) ([place]) 09/20/2017  . Leukocytosis   . Aortic stenosis, moderate 08/22/2017  . CAD (coronary artery disease) 08/21/2017  . Anemia 07/23/2017  . Pleural effusion on right   . Acute respiratory failure with hypoxia ([place])   . Streptococcal pneumonia ([place]) 06/26/2017  . Dehydration 06/25/2017  . Community acquired pneumonia of right lower lobe of lung ([place]) 06/25/2017  . Fever 06/25/2017  . Cough 06/25/2017  . Severe sepsis with acute organ dysfunction ([place]) 06/25/2017  . AKI (acute kidney injury) ([place]) 06/25/2017  . Current chronic use of systemic steroids 06/25/2017  . Chronic lower back pain 05/14/2017  . Varicose veins of left lower extremity with complications 97/06/6376  . Bilateral hearing loss 02/01/2017  . Hypertension 07/31/2016  . Leg edema 07/31/2016   . Prediabetes 06/05/2016  . Osteopenia 12/01/2015  . Anxiety 07/19/2015  . Degenerative cervical disc 12/28/2014  . Left ovarian cyst   . Peripheral neuropathy 06/03/2012  . Insomnia   . PVD (peripheral vascular disease) ([place]) 11/23/2010  . Bursitis of hip 10/26/2010  . Hyperlipidemia 06/27/2010  . IMMUNE THROMBOCYTOPENIC PURPURA 06/27/2010  . Unspecified glaucoma 06/27/2010  . GERD 06/27/2010  . Irritable bowel syndrome 06/27/2010  . OVERACTIVE BLADDER 06/27/2010  . OSTEOARTHRITIS, KNEE, RIGHT 06/27/2010  . URINARY INCONTINENCE 06/27/2010    No sign of SBO  Advance diet  Home when medically stable   LOS: 1 day    Joyice Faster Kyron Schlitt 09/21/2017

## 2017-09-21 NOTE — Progress Notes (Signed)
PROGRESS NOTE    Tamara Robertson  NWG:[id number] DOB: August 08, 1929 DOA: 09/20/2017 PCP: Pincus Sanes, MD   Brief Narrative: Patient is a 84 year old female history of ITP on chronic prednisone, noncritical coronary artery disease, moderate aortic stenosis, status post cholecystectomy, appendectomy, hysterectomy, splenectomy, sigmoid colectomy, IBS presented to the ED and noted to have low-grade small bowel obstruction.   Assessment & Plan:   Principal Problem:   SBO (small bowel obstruction) (HCC) Active Problems:   Hyperlipidemia   IMMUNE THROMBOCYTOPENIC PURPURA   Unspecified glaucoma   GERD   Irritable bowel syndrome   PVD (peripheral vascular disease) (HCC)   Anxiety   Hypertension   Bilateral hearing loss   Chronic lower back pain   Current chronic use of systemic steroids   Anemia   CAD (coronary artery disease)   Aortic stenosis, moderate  1 low-grade small bowel obstruction Patient presented with nausea vomiting and diffuse cramping abdominal pain.  States her last bowel movement was 3 days prior to admission.  Patient noted on presentation to have some bilious emesis per patient.  CT abdomen and pelvis which was done consistent with low-grade small bowel obstruction possibly adhesion and moderate amount of retained large bowel stool.  Mild colonic diverticulosis.  Patient improved clinically.  Patient with multiple bowel movements overnight.  Denies any further abdominal pain.  Patient seen by general surgery and as was improving clinically NG tube was not placed.  Patient has been started on a diet and to be advanced as tolerated per general surgery.  If continued improvement and tolerating a solid diet and stable likely discharge home tomorrow with outpatient follow-up with PCP.   2.  Leukocytosis Likely a reactive leukocytosis.  Patient afebrile.  Urinalysis unremarkable.  Chest x-ray negative for any infiltrate.   Leukocytosis trending down.  No need for any  antibiotics.  3.  Gastroesophageal reflux disease IV Pepcid has been changed to oral Pepcid.    4.  Hypertension Transition from IV Lopressor to home dose oral Lopressor.   5.  Moderate aortic stenosis/coronary artery disease Currently stable.  Transition from IV Lopressor back to home dose oral Lopressor.  Outpatient follow-up.  6.  History of ITP Patient chronically on low-dose prednisone 2.5 mg daily.  Transition from IV Solu-Medrol to prednisone 10 mg daily and taper back to home regimen.   7.  Chronic pain Stable.  Continue Lidoderm patch.    8.  Glaucoma Continue eyedrops.     DVT prophylaxis: SCDs Code Status: DNR Family Communication: Updated patient.  No family at bedside. Disposition Plan: Likely home once tolerating diet hopefully tomorrow.   Consultants:   General surgery: Dr. Magnus Ivan 09/20/2017  Procedures:   CT abdomen and pelvis 09/20/2017  Abdominal films 09/21/2017   Chest x-ray 09/20/2017  Antimicrobials:  None   Subjective: Sitting up at bedside eating lunch.  Patient denies any nausea or emesis.  Denies any abdominal pain.  Patient states had multiple bowel movements last night.  She states ambulated in the hallway.  Objective: Vitals:   09/20/17 1900 09/20/17 1955 09/21/17 0537 09/21/17 1415  BP: (!) 149/70 (!) 173/66 136/73 139/62  Pulse: (!) 106 92 89 88  Resp: (!) 23 16 15 16   Temp:  98 F (36.7 C) 98.2 F (36.8 C)   TempSrc:  Oral Oral   SpO2: 98% 96% 96% 99%  Weight:   63.1 kg (139 lb 1.8 oz)   Height:        Intake/Output Summary (Last  24 hours) at 09/21/2017 1759 Last data filed at 09/21/2017 1400 Gross per 24 hour  Intake 1703 ml  Output 750 ml  Net 953 ml   Filed Weights   09/20/17 1125 09/21/17 0537  Weight: 60.8 kg (134 lb) 63.1 kg (139 lb 1.8 oz)    Examination:  General exam: Appears calm and comfortable  Respiratory system: Clear to auscultation. Respiratory effort normal. Cardiovascular system: S1 &  S2 heard, RRR. No JVD, murmurs, rubs, gallops or clicks. No pedal edema. Gastrointestinal system: Abdomen is nondistended, soft and nontender. No organomegaly or masses felt. Normal bowel sounds heard. Central nervous system: Alert and oriented. No focal neurological deficits. Extremities: Symmetric 5 x 5 power. Skin: No rashes, lesions or ulcers Psychiatry: Judgement and insight appear normal. Mood & affect appropriate.     Data Reviewed: I have personally reviewed following labs and imaging studies  CBC: Recent Labs  Lab 09/20/17 1151 09/21/17 0434  WBC 15.2* 9.7  HGB 14.1 12.3  HCT 42.8 37.6  MCV 85.1 85.6  PLT 184 158   Basic Metabolic Panel: Recent Labs  Lab 09/20/17 1151 09/21/17 0434  NA 141 139  K 3.8 4.4  CL 97* 106  CO2 28 23  GLUCOSE 114* 126*  BUN 30* 19  CREATININE 0.99 0.75  CALCIUM 9.2 8.3*  MG 2.2  --   PHOS  --  3.4   GFR: Estimated Creatinine Clearance: 44.1 mL/min (by C-G formula based on SCr of 0.75 mg/dL). Liver Function Tests: Recent Labs  Lab 09/20/17 1151  AST 24  ALT 15  ALKPHOS 75  BILITOT 0.8  PROT 7.7  ALBUMIN 4.0   Recent Labs  Lab 09/20/17 1151  LIPASE 23   No results for input(s): AMMONIA in the last 168 hours. Coagulation Profile: No results for input(s): INR, PROTIME in the last 168 hours. Cardiac Enzymes: Recent Labs  Lab 09/20/17 1150  TROPONINI <0.03   BNP (last 3 results) No results for input(s): PROBNP in the last 8760 hours. HbA1C: No results for input(s): HGBA1C in the last 72 hours. CBG: No results for input(s): GLUCAP in the last 168 hours. Lipid Profile: No results for input(s): CHOL, HDL, LDLCALC, TRIG, CHOLHDL, LDLDIRECT in the last 72 hours. Thyroid Function Tests: No results for input(s): TSH, T4TOTAL, FREET4, T3FREE, THYROIDAB in the last 72 hours. Anemia Panel: No results for input(s): VITAMINB12, FOLATE, FERRITIN, TIBC, IRON, RETICCTPCT in the last 72 hours. Sepsis Labs: No results for  input(s): PROCALCITON, LATICACIDVEN in the last 168 hours.  No results found for this or any previous visit (from the past 240 hour(s)).       Radiology Studies: Ct Abdomen Pelvis Wo Contrast  Result Date: 09/20/2017 CLINICAL DATA:  RIGHT upper quadrant tenderness, nausea and vomiting for 2 days. History of diverticulitis, bowel resection, splenectomy, appendectomy, hysterectomy, cholecystectomy. EXAM: CT ABDOMEN AND PELVIS WITHOUT CONTRAST TECHNIQUE: Multidetector CT imaging of the abdomen and pelvis was performed following the standard protocol without IV contrast. COMPARISON:  Pelvic ultrasound January 08, 2017 and CT abdomen and pelvis April 22, 2015 FINDINGS: LOWER CHEST: Lung bases are clear. The visualized heart size is normal. No pericardial effusion. Minimal retained versus refluxed contrast in distal esophagus. HEPATOBILIARY: Status post cholecystectomy. Negative noncontrast CT liver. PANCREAS: Normal. SPLEEN: Surgically absent. ADRENALS/URINARY TRACT: Kidneys are orthotopic, demonstrating normal size and morphology. No nephrolithiasis, hydronephrosis; limited assessment for renal masses on this nonenhanced examination. Known RIGHT upper pole cyst not conspicuous by noncontrast CT. The unopacified ureters are normal  in course and caliber. Urinary bladder is partially distended and unremarkable. Normal adrenal glands. STOMACH/BOWEL: Sigmoid bowel anastomosis. Mild descending and residual sigmoid colonic diverticulosis. Moderate to large amount of retained large bowel stool. Mild circumferential RIGHT lower quadrant small bowel wall thickening (series 2, image 60), bowel is apposed to the anterior abdominal wall. Small bowel proximal to wall thickening is 3 cm. VASCULAR/LYMPHATIC: Aortoiliac vessels are normal in course and caliber. Mild calcific atherosclerosis. No lymphadenopathy by CT size criteria. REPRODUCTIVE: Homogeneously hypodense 3.4 cm LEFT ovarian cyst, relatively stable from 2016,  likely benign. Status post hysterectomy. OTHER: No intraperitoneal free fluid or free air. MUSCULOSKELETAL: Non-acute. Moderate spondylosis. Small calcified probable disc extrusion L1. Anterior abdominal wall scarring. IMPRESSION: 1. Short segment small bowel wall thickening RIGHT lower quadrant resulting in low-grade bowel obstruction, possible adhesion. Findings may also be infectious or inflammatory, less likely neoplastic. 2. Moderate amount of retained large bowel stool. Mild colonic diverticulosis. Aortic Atherosclerosis (ICD10-I70.0). Electronically Signed   By: Awilda Metro M.D.   On: 09/20/2017 16:07   Dg Chest 2 View  Result Date: 09/20/2017 CLINICAL DATA:  RIGHT upper quadrant tenderness, nausea and vomiting for 2 days. EXAM: CHEST - 2 VIEW COMPARISON:  Chest radiograph July 23, 2017 FINDINGS: Cardiomediastinal silhouette is normal. No pleural effusions or focal consolidations. Trachea projects midline and there is no pneumothorax. Soft tissue planes and included osseous structures are non-suspicious. Osteopenia. IMPRESSION: No active cardiopulmonary disease. Electronically Signed   By: Awilda Metro M.D.   On: 09/20/2017 19:24   Dg Abd 2 Views  Result Date: 09/21/2017 CLINICAL DATA:  Abdominal distention EXAM: ABDOMEN - 2 VIEW COMPARISON:  Yesterday FINDINGS: Distended small bowel loop in the left abdomen is again noted, measuring up to nearly 4 cm in diameter. Oral contrast is seen throughout the nondistended colon. No concerning mass effect or gas collection. The visualized lungs are clear IMPRESSION: 1. Partial small bowel obstruction with unchanged small bowel dilatation. 2. Previously administered oral contrast reaches the rectum. Electronically Signed   By: Marnee Spring M.D.   On: 09/21/2017 08:20        Scheduled Meds: . famotidine  20 mg Oral BID  . latanoprost  1 drop Both Eyes QHS  . lidocaine  2 patch Transdermal Q24H  . metoprolol tartrate  2.5 mg Intravenous  Q8H  . predniSONE  10 mg Oral QAC breakfast  . sodium chloride flush  3 mL Intravenous Q12H  . triamcinolone cream  1 application Topical BID   Continuous Infusions: . sodium chloride 0.9 % 1,000 mL with potassium chloride 40 mEq infusion 100 mL/hr at 09/21/17 0900     LOS: 1 day    Time spent: 35 minutes    Ramiro Harvest, MD [facility] Pager [phone]  If 7PM-7AM, please contact night-coverage [email or web address] Password [facility] 09/21/2017, 5:59 PM

## 2017-09-22 DIAGNOSIS — M545 Low back pain: Secondary | ICD-10-CM

## 2017-09-22 DIAGNOSIS — G8929 Other chronic pain: Secondary | ICD-10-CM

## 2017-09-22 LAB — BASIC METABOLIC PANEL
ANION GAP: 10 (ref 5–15)
BUN: 21 mg/dL — ABNORMAL HIGH (ref 6–20)
CALCIUM: 8.6 mg/dL — AB (ref 8.9–10.3)
CO2: 19 mmol/L — ABNORMAL LOW (ref 22–32)
Chloride: 108 mmol/L (ref 101–111)
Creatinine, Ser: 0.77 mg/dL (ref 0.44–1.00)
Glucose, Bld: 109 mg/dL — ABNORMAL HIGH (ref 65–99)
Potassium: 4.4 mmol/L (ref 3.5–5.1)
Sodium: 137 mmol/L (ref 135–145)

## 2017-09-22 MED ORDER — LIDOCAINE 5 % EX PTCH: 2.0000 | MEDICATED_PATCH | CUTANEOUS | 0 refills | Status: DC

## 2017-09-22 MED ORDER — GI COCKTAIL ~~LOC~~
30.0000 mL | Freq: Once | ORAL | Status: AC
  Administered 2017-09-22: 30 mL via ORAL
  Filled 2017-09-22: qty 30

## 2017-09-22 MED ORDER — PANTOPRAZOLE SODIUM 40 MG PO TBEC: 40.0000 mg | DELAYED_RELEASE_TABLET | Freq: Every day | ORAL | 0 refills | Status: DC

## 2017-09-22 MED ORDER — PREDNISONE 2.5 MG PO TABS: 2.5000 mg | ORAL_TABLET | ORAL | 0 refills | Status: DC

## 2017-09-22 NOTE — Progress Notes (Signed)
Pt alert, oriented, ambulating, and tolerating diet.  D/C instructions and prescription was given. All questions answered. Pt d/cd with daughter.

## 2017-09-22 NOTE — Progress Notes (Signed)
Subjective/Chief Complaint: Pt tolerating her diet  no pain or nausea vomiting    Objective: Vital signs in last 24 hours: Temp:  [98.6 F (37 C)] 98.6 F (37 C) (05/18 2155) Pulse Rate:  [81-88] 81 (05/18 2155) Resp:  [16-18] 18 (05/18 2155) BP: (139)/(58-62) 139/58 (05/18 2155) SpO2:  [98 %-99 %] 98 % (05/18 2155) Last BM Date: 09/20/17  Intake/Output from previous day: 05/18 0701 - 05/19 0700 In: 2463 [P.O.:480; I.V.:1983] Out: 750 [Urine:750] Intake/Output this shift: No intake/output data recorded.  GI: soft NT ND   Lab Results:  Recent Labs    09/20/17 1151 09/21/17 0434  WBC 15.2* 9.7  HGB 14.1 12.3  HCT 42.8 37.6  PLT 184 158   BMET Recent Labs    09/21/17 0434 09/22/17 0416  NA 139 137  K 4.4 4.4  CL 106 108  CO2 23 19*  GLUCOSE 126* 109*  BUN 19 21*  CREATININE 0.75 0.77  CALCIUM 8.3* 8.6*   PT/INR No results for input(s): LABPROT, INR in the last 72 hours. ABG No results for input(s): PHART, HCO3 in the last 72 hours.  Invalid input(s): PCO2, PO2  Studies/Results: Ct Abdomen Pelvis Wo Contrast  Result Date: 09/20/2017 CLINICAL DATA:  RIGHT upper quadrant tenderness, nausea and vomiting for 2 days. History of diverticulitis, bowel resection, splenectomy, appendectomy, hysterectomy, cholecystectomy. EXAM: CT ABDOMEN AND PELVIS WITHOUT CONTRAST TECHNIQUE: Multidetector CT imaging of the abdomen and pelvis was performed following the standard protocol without IV contrast. COMPARISON:  Pelvic ultrasound January 08, 2017 and CT abdomen and pelvis April 22, 2015 FINDINGS: LOWER CHEST: Lung bases are clear. The visualized heart size is normal. No pericardial effusion. Minimal retained versus refluxed contrast in distal esophagus. HEPATOBILIARY: Status post cholecystectomy. Negative noncontrast CT liver. PANCREAS: Normal. SPLEEN: Surgically absent. ADRENALS/URINARY TRACT: Kidneys are orthotopic, demonstrating normal size and morphology. No  nephrolithiasis, hydronephrosis; limited assessment for renal masses on this nonenhanced examination. Known RIGHT upper pole cyst not conspicuous by noncontrast CT. The unopacified ureters are normal in course and caliber. Urinary bladder is partially distended and unremarkable. Normal adrenal glands. STOMACH/BOWEL: Sigmoid bowel anastomosis. Mild descending and residual sigmoid colonic diverticulosis. Moderate to large amount of retained large bowel stool. Mild circumferential RIGHT lower quadrant small bowel wall thickening (series 2, image 60), bowel is apposed to the anterior abdominal wall. Small bowel proximal to wall thickening is 3 cm. VASCULAR/LYMPHATIC: Aortoiliac vessels are normal in course and caliber. Mild calcific atherosclerosis. No lymphadenopathy by CT size criteria. REPRODUCTIVE: Homogeneously hypodense 3.4 cm LEFT ovarian cyst, relatively stable from 2016, likely benign. Status post hysterectomy. OTHER: No intraperitoneal free fluid or free air. MUSCULOSKELETAL: Non-acute. Moderate spondylosis. Small calcified probable disc extrusion L1. Anterior abdominal wall scarring. IMPRESSION: 1. Short segment small bowel wall thickening RIGHT lower quadrant resulting in low-grade bowel obstruction, possible adhesion. Findings may also be infectious or inflammatory, less likely neoplastic. 2. Moderate amount of retained large bowel stool. Mild colonic diverticulosis. Aortic Atherosclerosis (ICD10-I70.0). Electronically Signed   By: Elon Alas M.D.   On: 09/20/2017 16:07   Dg Chest 2 View  Result Date: 09/20/2017 CLINICAL DATA:  RIGHT upper quadrant tenderness, nausea and vomiting for 2 days. EXAM: CHEST - 2 VIEW COMPARISON:  Chest radiograph July 23, 2017 FINDINGS: Cardiomediastinal silhouette is normal. No pleural effusions or focal consolidations. Trachea projects midline and there is no pneumothorax. Soft tissue planes and included osseous structures are non-suspicious. Osteopenia.  IMPRESSION: No active cardiopulmonary disease. Electronically Signed   By:  Elon Alas M.D.   On: 09/20/2017 19:24   Dg Abd 2 Views  Result Date: 09/21/2017 CLINICAL DATA:  Abdominal distention EXAM: ABDOMEN - 2 VIEW COMPARISON:  Yesterday FINDINGS: Distended small bowel loop in the left abdomen is again noted, measuring up to nearly 4 cm in diameter. Oral contrast is seen throughout the nondistended colon. No concerning mass effect or gas collection. The visualized lungs are clear IMPRESSION: 1. Partial small bowel obstruction with unchanged small bowel dilatation. 2. Previously administered oral contrast reaches the rectum. Electronically Signed   By: Monte Fantasia M.D.   On: 09/21/2017 08:20    Anti-infectives: Anti-infectives (From admission, onward)   None      Assessment/Plan: p SBO  Resolving  Can go home if she tolerated breakfast  Will sign off    LOS: 2 days    Marcello Moores A Nicci Vaughan 09/22/2017

## 2017-09-22 NOTE — Discharge Summary (Signed)
Physician Discharge Summary  Tamara Robertson KGM:[id number] DOB: 04-28-1930 DOA: 09/20/2017  PCP: Pincus Sanes, MD  Admit date: 09/20/2017 Discharge date: 09/22/2017  Time spent: 50 minutes  Recommendations for Outpatient Follow-up:  1. Follow-up with Pincus Sanes, MD in 1 to 2 weeks for follow-up on low-grade small bowel obstruction.   Discharge Diagnoses:  Principal Problem:   SBO (small bowel obstruction) (HCC) Active Problems:   Hyperlipidemia   IMMUNE THROMBOCYTOPENIC PURPURA   Unspecified glaucoma   GERD   Irritable bowel syndrome   PVD (peripheral vascular disease) (HCC)   Anxiety   Hypertension   Bilateral hearing loss   Chronic lower back pain   Current chronic use of systemic steroids   Anemia   CAD (coronary artery disease)   Aortic stenosis, moderate   Discharge Condition: Stable and improved  Diet recommendation: Regular  Filed Weights   09/20/17 1125 09/21/17 0537  Weight: 60.8 kg (134 lb) 63.1 kg (139 lb 1.8 oz)    History of present illness:  Tamara Robertson is a 84 y.o. female with medical history significant of ITP on chronic prednisone, noncritical coronary artery disease, moderate aortic stenosis, status post cholecystectomy, appendectomy, hysterectomy, splenectomy, sigmoid colectomy, IBS who presented to the ED with a 2-day history of nausea and vomiting as well as lower abdominal cramping pain.  Patient stated emesis was initially brownish and on the day of admission became more bilious.  Patient denied any hematemesis no hematochezia no melena.  Denied any fever, no chills, no chest pain, no wheezing, no syncope.  Patient stated does have alternating constipation and diarrhea for over the past 10 years.  Patient denied any focal neurological deficits.  Patient did endorse some generalized weakness decreased oral intake.  She denied any dysuria.  [facility] were called to admit the patient for further evaluation and management. As I entered the room  patient sitting on bedside commode having a bowel movement.  ED Course: Patient seen in the ED comprehensive metabolic profile with a chloride of 97 glucose of 114 otherwise is within normal limits.  Lipase level of 23.  Troponin I was less than 0.03.  EKG with normal sinus rhythm.  CBC had a white count of 15.2, platelet count of 184 otherwise was within normal limits.  CT abdomen and pelvis with a short segment small bowel wall thickening right lower quadrant resulted in a low-grade bowel obstruction, possible adhesion.  Findings may be infectious or inflammatory less likely neoplastic.  Moderate amount of retained large bowel stool.  Mild colonic diverticulosis.  ED consulted general surgery who recommended NG tube placement, medical admission and will formally consult on the patient    Hospital Course:  1 low-grade small bowel obstruction Patient presented with nausea vomiting and diffuse cramping abdominal pain. Stated her last bowel movement was 3 days prior to admission. Patient noted on presentation to have some bilious emesis per patient. CT abdomen and pelvis which was done consistent with low-grade small bowel obstruction possibly adhesion and moderate amount of retained large bowel stool. Mild colonic diverticulosis.  Patient was admitted placed on bowel rest, IV fluid hydration, antiemetics and supportive care.  NG tube was initially to be placed however as patient was improving clinically and had no further nausea vomiting this was not done.  Patient started having bowel movements improved clinically and followed by general surgery during the hospitalization.  Patient was subsequently started on clear liquids and diet advanced as tolerated to a soft diet which  she tolerated.  Patient was passing flatus.  Patient had bowel movement.  Per general surgery it was felt patient's low-grade small bowel obstruction had resolved by day of discharge.  Patient will be discharged home in stable and  improved condition to follow-up with PCP in the outpatient setting.   2.Leukocytosis Likely a reactive leukocytosis.  Patient  remained afebrile.  Chest x-ray was negative for any infiltrate.  Urinalysis was unremarkable.  Leukocytosis trended down and had resolved by day of discharge.   3. Gastroesophageal reflux disease Maintained on IV Pepcid and subsequently transitioned to oral Pepcid during the hospitalization.  Patient will be discharged home on Protonix 40 mg daily.  Outpatient follow-up with PCP.    4. Hypertension On admission as patient was placed on bowel rest she was placed on IV Lopressor for blood pressure control.  As patient was transitioned to oral diet she was subsequently transitioned to home dose oral Lopressor.    5. Moderate aortic stenosis/coronary artery disease Remained stable.  Initially when patient was n.p.o. she was placed on IV Lopressor and subsequently transition back to home dose oral Lopressor on discharge.  Outpatient follow-up.   6. History of ITP Patient chronically on low-dose prednisone 2.5 mg every other day.  She was initially placed on IV Solu-Medrol and transition to oral dose prednisone and will be discharged on a quick prednisone taper back to home regimen of prednisone 2.5 mg every other day.    7. Chronic pain Stable.  Maintained on home regimen of Lidoderm patch.   8.Glaucoma Continued on home regimen of eyedrops.    Procedures:  CT abdomen and pelvis 09/20/2017  Abdominal films 09/21/2017   Chest x-ray 09/20/2017      Consultations:  General surgery: Dr. Magnus Ivan 09/20/2017      Discharge Exam: Vitals:   09/21/17 2155 09/22/17 0724  BP: (!) 139/58 (!) 163/68  Pulse: 81 71  Resp: 18 18  Temp: 98.6 F (37 C) 98.4 F (36.9 C)  SpO2: 98% 100%    General: NAD Cardiovascular: RRR Respiratory: CTAB  Discharge Instructions   Discharge Instructions    Diet general   Complete by:  As directed     Increase activity slowly   Complete by:  As directed      Allergies as of 09/22/2017      Reactions   Aspirin Other (See Comments)   ITP   Sulfa Antibiotics Other (See Comments)   dizziness   Contrast Media [iodinated Diagnostic Agents] Hives   Lasix [furosemide] Rash   Statins Other (See Comments)      Medication List    TAKE these medications   acetaminophen 325 MG tablet Commonly known as:  TYLENOL Take 650 mg by mouth every 6 (six) hours as needed for fever.   Biotin 2500 MCG Caps Take 1 capsule by mouth daily.   CITRACAL CALCIUM+D 600-40-500 MG-MG-UNIT Tb24 Generic drug:  Calcium-Magnesium-Vitamin D Take 1 tablet by mouth daily.   feeding supplement (ENSURE ENLIVE) Liqd Take 237 mLs by mouth 2 (two) times daily between meals.   Flaxseed Misc Take 5 mLs by mouth daily. Take 1 tsp daily   furosemide 20 MG tablet Commonly known as:  LASIX Take 1 tablet (20 mg total) by mouth daily.   latanoprost 0.005 % ophthalmic solution Commonly known as:  XALATAN Place 1 drop into both eyes at bedtime.   lidocaine 5 % Commonly known as:  LIDODERM Place 2 patches onto the skin daily. Remove & Discard patch  within 12 hours or as directed by MD   metoprolol tartrate 25 MG tablet Commonly known as:  LOPRESSOR Take 0.5 tablets (12.5 mg total) by mouth 2 (two) times daily.   multivitamin tablet Take 1 tablet by mouth daily.   ondansetron 4 MG disintegrating tablet Commonly known as:  ZOFRAN ODT Take 1 tablet (4 mg total) by mouth every 8 (eight) hours as needed for nausea or vomiting.   pantoprazole 40 MG tablet Commonly known as:  PROTONIX Take 1 tablet (40 mg total) by mouth daily. What changed:    medication strength  how much to take   polyethylene glycol packet Commonly known as:  MIRALAX / GLYCOLAX Take 17 g by mouth daily as needed for moderate constipation.   predniSONE 2.5 MG tablet Commonly known as:  DELTASONE Take 1-2 tablets (2.5-5 mg total) by  mouth every other day. Take 2 tablets (5mg ) on 09/23/2017, then 1 tablet (2.5mg ) on 09/24/2017, then back to home regimen of every other day. What changed:    how much to take  when to take this  additional instructions   PROBIOTIC FORMULA PO Take 1 tablet by mouth daily.   triamcinolone cream 0.1 % Commonly known as:  KENALOG Apply 1 application topically 2 (two) times daily.      Allergies  Allergen Reactions  . Aspirin Other (See Comments)    ITP  . Sulfa Antibiotics Other (See Comments)    dizziness  . Contrast Media [Iodinated Diagnostic Agents] Hives  . Lasix [Furosemide] Rash  . Statins Other (See Comments)   Follow-up Information    Burns, Bobette Mo, MD. Schedule an appointment as soon as possible for a visit in 1 week(s).   Specialty:  Internal Medicine Why:  f/u in 1-2 weeks. Contact information: [address] [place] [address] [phone]            The results of significant diagnostics from this hospitalization (including imaging, microbiology, ancillary and laboratory) are listed below for reference.    Significant Diagnostic Studies: Ct Abdomen Pelvis Wo Contrast  Result Date: 09/20/2017 CLINICAL DATA:  RIGHT upper quadrant tenderness, nausea and vomiting for 2 days. History of diverticulitis, bowel resection, splenectomy, appendectomy, hysterectomy, cholecystectomy. EXAM: CT ABDOMEN AND PELVIS WITHOUT CONTRAST TECHNIQUE: Multidetector CT imaging of the abdomen and pelvis was performed following the standard protocol without IV contrast. COMPARISON:  Pelvic ultrasound January 08, 2017 and CT abdomen and pelvis April 22, 2015 FINDINGS: LOWER CHEST: Lung bases are clear. The visualized heart size is normal. No pericardial effusion. Minimal retained versus refluxed contrast in distal esophagus. HEPATOBILIARY: Status post cholecystectomy. Negative noncontrast CT liver. PANCREAS: Normal. SPLEEN: Surgically absent. ADRENALS/URINARY TRACT: Kidneys are  orthotopic, demonstrating normal size and morphology. No nephrolithiasis, hydronephrosis; limited assessment for renal masses on this nonenhanced examination. Known RIGHT upper pole cyst not conspicuous by noncontrast CT. The unopacified ureters are normal in course and caliber. Urinary bladder is partially distended and unremarkable. Normal adrenal glands. STOMACH/BOWEL: Sigmoid bowel anastomosis. Mild descending and residual sigmoid colonic diverticulosis. Moderate to large amount of retained large bowel stool. Mild circumferential RIGHT lower quadrant small bowel wall thickening (series 2, image 60), bowel is apposed to the anterior abdominal wall. Small bowel proximal to wall thickening is 3 cm. VASCULAR/LYMPHATIC: Aortoiliac vessels are normal in course and caliber. Mild calcific atherosclerosis. No lymphadenopathy by CT size criteria. REPRODUCTIVE: Homogeneously hypodense 3.4 cm LEFT ovarian cyst, relatively stable from 2016, likely benign. Status post hysterectomy. OTHER: No intraperitoneal free fluid  or free air. MUSCULOSKELETAL: Non-acute. Moderate spondylosis. Small calcified probable disc extrusion L1. Anterior abdominal wall scarring. IMPRESSION: 1. Short segment small bowel wall thickening RIGHT lower quadrant resulting in low-grade bowel obstruction, possible adhesion. Findings may also be infectious or inflammatory, less likely neoplastic. 2. Moderate amount of retained large bowel stool. Mild colonic diverticulosis. Aortic Atherosclerosis (ICD10-I70.0). Electronically Signed   By: Awilda Metro M.D.   On: 09/20/2017 16:07   Dg Chest 2 View  Result Date: 09/20/2017 CLINICAL DATA:  RIGHT upper quadrant tenderness, nausea and vomiting for 2 days. EXAM: CHEST - 2 VIEW COMPARISON:  Chest radiograph July 23, 2017 FINDINGS: Cardiomediastinal silhouette is normal. No pleural effusions or focal consolidations. Trachea projects midline and there is no pneumothorax. Soft tissue planes and included  osseous structures are non-suspicious. Osteopenia. IMPRESSION: No active cardiopulmonary disease. Electronically Signed   By: Awilda Metro M.D.   On: 09/20/2017 19:24   Dg Abd 2 Views  Result Date: 09/21/2017 CLINICAL DATA:  Abdominal distention EXAM: ABDOMEN - 2 VIEW COMPARISON:  Yesterday FINDINGS: Distended small bowel loop in the left abdomen is again noted, measuring up to nearly 4 cm in diameter. Oral contrast is seen throughout the nondistended colon. No concerning mass effect or gas collection. The visualized lungs are clear IMPRESSION: 1. Partial small bowel obstruction with unchanged small bowel dilatation. 2. Previously administered oral contrast reaches the rectum. Electronically Signed   By: Marnee Spring M.D.   On: 09/21/2017 08:20    Microbiology: No results found for this or any previous visit (from the past 240 hour(s)).   Labs: Basic Metabolic Panel: Recent Labs  Lab 09/20/17 1151 09/21/17 0434 09/22/17 0416  NA 141 139 137  K 3.8 4.4 4.4  CL 97* 106 108  CO2 28 23 19*  GLUCOSE 114* 126* 109*  BUN 30* 19 21*  CREATININE 0.99 0.75 0.77  CALCIUM 9.2 8.3* 8.6*  MG 2.2  --   --   PHOS  --  3.4  --    Liver Function Tests: Recent Labs  Lab 09/20/17 1151  AST 24  ALT 15  ALKPHOS 75  BILITOT 0.8  PROT 7.7  ALBUMIN 4.0   Recent Labs  Lab 09/20/17 1151  LIPASE 23   No results for input(s): AMMONIA in the last 168 hours. CBC: Recent Labs  Lab 09/20/17 1151 09/21/17 0434  WBC 15.2* 9.7  HGB 14.1 12.3  HCT 42.8 37.6  MCV 85.1 85.6  PLT 184 158   Cardiac Enzymes: Recent Labs  Lab 09/20/17 1150  TROPONINI <0.03   BNP: BNP (last 3 results) No results for input(s): BNP in the last 8760 hours.  ProBNP (last 3 results) No results for input(s): PROBNP in the last 8760 hours.  CBG: No results for input(s): GLUCAP in the last 168 hours.     Signed:  Ramiro Harvest MD.  [facility] 09/22/2017, 9:01 AM

## 2017-09-23 ENCOUNTER — Telehealth: Payer: Self-pay | Admitting: *Deleted

## 2017-09-23 NOTE — Telephone Encounter (Signed)
Transition Care Management Follow-up Telephone Call   Date discharged? 09/22/17   How have you been since you were released from the hospital? Pt states she is doing alright still little weak, but that's expected by being in the hospital back to back, but overall she is alright   Do you understand why you were in the hospital? YES   Do you understand the discharge instructions? YES   Where were you discharged to? Home   Items Reviewed:  Medications reviewed: YES  Allergies reviewed: YES  Dietary changes reviewed: YES  Referrals reviewed: No referral needed   Functional Questionnaire:  Activities of Daily Living (ADLs):   She states she are independent in the following: ambulation, bathing and hygiene, feeding, continence, grooming, toileting and dressing States she doesn't require assistance    Any transportation issues/concerns?: NO   Any patient concerns? NO   Confirmed importance and date/time of follow-up visits scheduled YES, appt 10/02/17  Provider Appointment booked with Dr. Quay Burow  Confirmed with patient if condition begins to worsen call PCP or go to the ER.  Patient was given the office number and encouraged to call back with question or concerns.  : YES

## 2017-09-24 ENCOUNTER — Telehealth: Payer: Self-pay | Admitting: Internal Medicine

## 2017-09-24 NOTE — Telephone Encounter (Signed)
Pt was at the hospital for severe constipation. She was able to move her bowels on Thursday but nothing after that. She is requesting an afternoon appt. Asap. Pls call her.

## 2017-09-24 NOTE — Telephone Encounter (Signed)
Pt scheduled to see Nicoletta Ba PA tomorrow afternoon at 3pm. Pt aware of appt.

## 2017-09-25 ENCOUNTER — Encounter: Payer: Self-pay | Admitting: Physician Assistant

## 2017-09-25 ENCOUNTER — Ambulatory Visit (INDEPENDENT_AMBULATORY_CARE_PROVIDER_SITE_OTHER)
Admission: RE | Admit: 2017-09-25 | Discharge: 2017-09-25 | Disposition: A | Discharge status: Home / Self Care | Payer: Medicare HMO | Source: Ambulatory Visit | Attending: Physician Assistant | Admitting: Physician Assistant

## 2017-09-25 ENCOUNTER — Ambulatory Visit: Payer: Medicare HMO | Admitting: Physician Assistant

## 2017-09-25 VITALS — BP 132/70 | HR 78 | Ht 62.0 in | Wt 138.1 lb

## 2017-09-25 DIAGNOSIS — K56609 Unspecified intestinal obstruction, unspecified as to partial versus complete obstruction: Secondary | ICD-10-CM | POA: Diagnosis not present

## 2017-09-25 DIAGNOSIS — R1031 Right lower quadrant pain: Secondary | ICD-10-CM

## 2017-09-25 DIAGNOSIS — K56600 Partial intestinal obstruction, unspecified as to cause: Secondary | ICD-10-CM

## 2017-09-25 DIAGNOSIS — K5900 Constipation, unspecified: Secondary | ICD-10-CM

## 2017-09-25 DIAGNOSIS — R1032 Left lower quadrant pain: Secondary | ICD-10-CM

## 2017-09-25 NOTE — Patient Instructions (Signed)
If you are age 82 or older, your body mass index should be between 23-30. Your Body mass index is 25.26 kg/m. If this is out of the aforementioned range listed, please consider follow up with your Primary Care Provider.   Your provider has requested that you have an abdominal x ray before leaving today. Please go to the basement floor to our Radiology department for the test.  Increase Miralax to 17 grams in 8 oz of water twice daily. Use a suppository once daily  ( Dulcolax ) as needed. We have provided you with a Low fiber diet.

## 2017-09-25 NOTE — Progress Notes (Addendum)
Subjective:    Patient ID: Tamara Robertson, female    DOB: Jan 23, 1930, 84 y.o.   MRN: [id number]  HPI Tamara Robertson is a very nice 84 year old white female, known to Dr. Rhea Belton who was last seen in the office in October 2018.  She has history of chronic GERD, peripheral vascular disease, hypertension, IBS, ITP for which she is maintained on low-dose steroids and is status post splenectomy.  She has had prior cholecystectomy in 2016 and had a sigmoidectomy in 2007 for sigmoid stricture from complicated diverticular disease.  She has history of chronic constipation and has been taking MiraLAX daily. She comes in today after hospitalization last week at which time she was diagnosed with a partial small bowel obstruction.  She was seen by the hospitalist and surgery, GI was not consulted. She says she had acute onset of symptoms on Wednesday, 09/18/2017 with acute abdominal pain and hard contraction-like episodes occurring every hour or so.  This was associated with nausea and vomiting.  Symptoms lasted for multiple hours.  She tried to go to sleep and when she woke up the following morning vomiting started again this time with green bile looking material.  She was feeling somewhat weak, distended and uncomfortable and had not had a bowel movement.  She went to the emergency room, had CT of the abdomen and pelvis which did show  wall thickening in the right lower quadrant, circumferential bowel opposed to the anterior abdominal wall.  Small bowel proximal to the wall thickening to 3 cm.  There was moderate to large amount of retained stool in the sigmoid anastomosis. KUB on 09/21/2017 showed a distended small bowel loop in the left abdomen again noted measuring nearly 4 cm in diameter, oral contrast was seen throughout the nondistended colon. Patient says she did not have any further vomiting after she was admitted to the hospital.  She feels that she got explosive diarrhea after the CT contrast.  She was tried on liquids  she which she tolerated, and was then discharged home.  Surgical notes indicate they felt her small bowel obstruction had resolved. Patient says she has not had a bowel movement since she was discharged from the hospital.  She continues to have some nausea but no vomiting.  Her appetite is been decreased but she has been able to eat regular food.  She continues to feel some discomfort in her lower abdomen and bloating but is not having any sharp cramps or pain.  She has been passing flatus but has not had a bowel movement until this morning when she passed just a couple of little pellet-like stools.  No fever or chills.  Review of Systems Pertinent positive and negative review of systems were noted in the above HPI section.  All other review of systems was otherwise negative.  Outpatient Encounter Medications as of 09/25/2017  Medication Sig  . acetaminophen (TYLENOL) 325 MG tablet Take 650 mg by mouth every 6 (six) hours as needed for fever.  . Biotin 2500 MCG CAPS Take 1 capsule by mouth daily.   . Calcium-Magnesium-Vitamin D (CITRACAL CALCIUM+D) 600-40-500 MG-MG-UNIT TB24 Take 1 tablet by mouth daily.   . feeding supplement, ENSURE ENLIVE, (ENSURE ENLIVE) LIQD Take 237 mLs by mouth 2 (two) times daily between meals.  . Flaxseed MISC Take 5 mLs by mouth daily. Take 1 tsp daily  . furosemide (LASIX) 20 MG tablet Take 1 tablet (20 mg total) by mouth daily.  [name] latanoprost (XALATAN) 0.005 % ophthalmic solution Place  1 drop into both eyes at bedtime.  . lidocaine (LIDODERM) 5 % Place 2 patches onto the skin daily. Remove & Discard patch within 12 hours or as directed by MD  . metoprolol tartrate (LOPRESSOR) 25 MG tablet Take 0.5 tablets (12.5 mg total) by mouth 2 (two) times daily.  . Multiple Vitamin (MULTIVITAMIN) tablet Take 1 tablet by mouth daily.    . ondansetron (ZOFRAN ODT) 4 MG disintegrating tablet Take 1 tablet (4 mg total) by mouth every 8 (eight) hours as needed for nausea or vomiting.  .  pantoprazole (PROTONIX) 40 MG tablet Take 1 tablet (40 mg total) by mouth daily.  . polyethylene glycol (MIRALAX / GLYCOLAX) packet Take 17 g by mouth daily as needed for moderate constipation.   . predniSONE (DELTASONE) 2.5 MG tablet Take 1-2 tablets (2.5-5 mg total) by mouth every other day. Take 2 tablets (5mg ) on 09/23/2017, then 1 tablet (2.5mg ) on 09/24/2017, then back to home regimen of every other day.  . Probiotic Product (PROBIOTIC FORMULA PO) Take 1 tablet by mouth daily.    [name] triamcinolone cream (KENALOG) 0.1 % Apply 1 application topically 2 (two) times daily.   No facility-administered encounter medications on file as of 09/25/2017.    Allergies  Allergen Reactions  . Aspirin Other (See Comments)    ITP  . Sulfa Antibiotics Other (See Comments)    dizziness  . Contrast Media [Iodinated Diagnostic Agents] Hives  . Lasix [Furosemide] Rash  . Statins Other (See Comments)   Patient Active Problem List   Diagnosis Date Noted  . SBO (small bowel obstruction) (HCC) 09/20/2017  . Leukocytosis   . Aortic stenosis, moderate 08/22/2017  . CAD (coronary artery disease) 08/21/2017  . Anemia 07/23/2017  . Pleural effusion on right   . Acute respiratory failure with hypoxia (HCC)   . Streptococcal pneumonia (HCC) 06/26/2017  . Dehydration 06/25/2017  . Community acquired pneumonia of right lower lobe of lung (HCC) 06/25/2017  . Fever 06/25/2017  . Cough 06/25/2017  . Severe sepsis with acute organ dysfunction (HCC) 06/25/2017  . AKI (acute kidney injury) (HCC) 06/25/2017  . Current chronic use of systemic steroids 06/25/2017  . Chronic lower back pain 05/14/2017  . Varicose veins of left lower extremity with complications 03/05/2017  . Bilateral hearing loss 02/01/2017  . Hypertension 07/31/2016  . Leg edema 07/31/2016  . Prediabetes 06/05/2016  . Osteopenia 12/01/2015  . Anxiety 07/19/2015  . Degenerative cervical disc 12/28/2014  . Left ovarian cyst   . Peripheral  neuropathy 06/03/2012  . Insomnia   . PVD (peripheral vascular disease) (HCC) 11/23/2010  . Bursitis of hip 10/26/2010  . Hyperlipidemia 06/27/2010  . IMMUNE THROMBOCYTOPENIC PURPURA 06/27/2010  . Unspecified glaucoma 06/27/2010  . GERD 06/27/2010  . Irritable bowel syndrome 06/27/2010  . OVERACTIVE BLADDER 06/27/2010  . OSTEOARTHRITIS, KNEE, RIGHT 06/27/2010  . URINARY INCONTINENCE 06/27/2010   Social History   Socioeconomic History  . Marital status: Widowed    Spouse name: Not on file  . Number of children: Not on file  . Years of education: Not on file  . Highest education level: Not on file  Occupational History  . Occupation: Retired    Comment: Programmer, multimedia  Social Needs  . Financial resource strain: Not on file  . Food insecurity:    Worry: Not on file    Inability: Not on file  . Transportation needs:    Medical: Not on file    Non-medical: Not on file  Tobacco Use  . Smoking status: Never Smoker  . Smokeless tobacco: Never Used  Substance and Sexual Activity  . Alcohol use: No    Alcohol/week: 0.0 oz    Comment: rarely  . Drug use: No  . Sexual activity: Not on file  Lifestyle  . Physical activity:    Days per week: Not on file    Minutes per session: Not on file  . Stress: Not on file  Relationships  . Social connections:    Talks on phone: Not on file    Gets together: Not on file    Attends religious service: Not on file    Active member of club or organization: Not on file    Attends meetings of clubs or organizations: Not on file    Relationship status: Not on file  . Intimate partner violence:    Fear of current or ex partner: Not on file    Emotionally abused: Not on file    Physically abused: Not on file    Forced sexual activity: Not on file  Other Topics Concern  . 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    [place] to [place] from [place] 05/2010 to be close to kids     Ms. Erekson family history includes Arthritis in her unknown relative; Coronary artery disease in her mother; Heart attack (age of onset: 68) in her mother; Hyperlipidemia in her daughter and mother; Hypertension in her daughter and mother; Stomach cancer in her father; Transient ischemic attack in her unknown relative.      Objective:    Vitals:   09/25/17 1514  BP: 132/70  Pulse: 78    Physical Exam; well-developed elderly white female in no acute distress, very pleasant blood pressure 132/70 pulse 78, height 5 foot 2, weight 138, BMI 25.2.  HEENT; nontraumatic normocephalic EOMI PERRLA sclera anicteric, Oropharynx benign.  Cardiovascular; regular rate and rhythm with S1-S2 she has a systolic murmur.  Pulmonary ;clear bilaterally, Abdomen ;soft, no significant distention bowel sounds are hyperactive she has some mild tenderness in the lower abdomen nonfocal guarding or rebound, midline incisional scars, Rectal ;exam not done, Ext; no clubbing cyanosis or edema skin warm and dry, Neuro psych; alert and oriented, grossly nonfocal and mood and affect appropriate       Assessment & Plan:   #37 84 year old white female status post multiple abdominal surgeries including splenectomy, hysterectomy cholecystectomy and sigmoidectomy for sigmoid stricture who comes in today after hospitalization last week with a partial small bowel obstruction Felt Most Likely Secondary to Adhesions.  Last abdominal films prior to discharge continued to show partial small bowel obstruction with unchanged small bowel dilation with a loop of small bowel in the left abdomen measuring nearly 4 cm. Patient has had persistent symptoms and had not had a bowel movement for 5 days after discharge.  She continues to complain of lower abdominal discomfort, bloating some nausea but has not had vomiting or severe pain. I suspect she has ongoing low-grade persistent partial small bowel obstruction.  Very likely this is secondary  to adhesions, rule out internal hernia.  Rule out possible small bowel lesion.  #2 peripheral vascular disease  #3 hypertension  #4 coronary artery disease  #5 aortic stenosis  #6 IBS  #7 GERD  Plan; KUB flat and upright today.  If persistent low-grade obstruction will schedule for CT enterography.  If obstruction has resolved would like to purge her  bowel with MiraLAX. For now she will continue MiraLAX but increase to 2 doses daily To need to push oral fluids and okay to eat solid food but have asked her to eat low fiber She also had purchased some suppositories but has not used one.  She will use a rectal suppository today and if this is helpful can use daily as needed to help evacuate bowel.  Amy S Esterwood PA-C 09/25/2017   Cc: Pincus Sanes, MD  Addendum: Reviewed and agree with management. Pyrtle, Carie Caddy, MD

## 2017-09-26 ENCOUNTER — Telehealth: Payer: Self-pay

## 2017-09-26 NOTE — Telephone Encounter (Signed)
Patient is advised. Reports feeling well. Hungry. She would also like to know when she may return to a regular diet.

## 2017-09-26 NOTE — Telephone Encounter (Signed)
Patient advised.

## 2017-09-26 NOTE — Telephone Encounter (Signed)
-----   Message from Alfredia Ferguson, PA-C sent at 09/26/2017  9:11 AM EDT ----- Please call pt - good new -the xray show resolution of partial small bowel obstruction -she does have a lot of stool in colon -I would like her take a Miralax purge  With 6-7 doses of miralax over 4 hours  To get bowel cleaned out , then  resume daily Miralax after that .-hopefully she will feel better - if still feeling poorly early next week ask to call  You bck and will need to see in office again

## 2017-09-26 NOTE — Telephone Encounter (Signed)
Ok to restart regular diet ,just dont push too much fiber:)

## 2017-09-30 NOTE — Progress Notes (Signed)
Subjective:    Patient ID: Tamara Robertson, female    DOB: [birth year], 84 y.o.   MRN: [id number]  HPI The patient is here for follow up from the hospital.  Admitted 09/20/17 - 09/22/17 for SBO  She went to the ED with 2 days of nausea/vomiting and lower abdominal cramping pain.  Her emesis was initially brown and then became more bilious.  She denied hematemesis, melena or hematochezia.  Her last BM was 3 days prior.  She had an elevated WBC.  EKG was normal.  CT of Ab/P w/ short segment of small bowel wall thickening RLQ causing low grade bowel obstruction, possible adhesion.  May be infectious or inflammatory, less likely neoplastic.  Moderate amount of stool in large bowel.  Mild colonic diverticulosis.  NG tube placed.    Low grade SBO: Placed on bowel rest, IVF, antiemetics NG placed initially General surgery followed Diet advanced She had a BM and passed gas SBO resolved Still with generalized abdominal discomfort  First normal BM today No blood in stool, no nausea, no fever  Leukocytosis: Likely reactive Afebrile CXR negative, UA unremarkable Resolved  GERD: IV pepcid changed to oral D/c'd on protonix 40 mg daily She denies gerd  Hypertension, CAD, Mod AS: stable When on bowel rest - had IV metoprolol, then transitioned back to oral BP at home well controlled - she brought in her log  H/o ITP: On chronic low dose prednisone 2.5 mg  Placed initially on IV solu-medrol  Discharged on quick prednisone taper and then will continue 2.5 mg daily Currently taking 2/5 mg of prednisone daily  Chronic pain Used lidoderm patches in the hospital Not needing them since being home Pain controlled   Medications and allergies reviewed with patient and updated if appropriate.  Patient Active Problem List   Diagnosis Date Noted  . SBO (small bowel obstruction) (HCC) 09/20/2017  . Leukocytosis   . Aortic stenosis, moderate 08/22/2017  . CAD (coronary artery disease)  08/21/2017  . Anemia 07/23/2017  . Pleural effusion on right   . Acute respiratory failure with hypoxia (HCC)   . Streptococcal pneumonia (HCC) 06/26/2017  . Dehydration 06/25/2017  . Community acquired pneumonia of right lower lobe of lung (HCC) 06/25/2017  . Fever 06/25/2017  . Cough 06/25/2017  . Severe sepsis with acute organ dysfunction (HCC) 06/25/2017  . AKI (acute kidney injury) (HCC) 06/25/2017  . Current chronic use of systemic steroids 06/25/2017  . Chronic lower back pain 05/14/2017  . Varicose veins of left lower extremity with complications 03/05/2017  . Bilateral hearing loss 02/01/2017  . Hypertension 07/31/2016  . Leg edema 07/31/2016  . Prediabetes 06/05/2016  . Osteopenia 12/01/2015  . Anxiety 07/19/2015  . Degenerative cervical disc 12/28/2014  . Left ovarian cyst   . Peripheral neuropathy 06/03/2012  . Insomnia   . PVD (peripheral vascular disease) (HCC) 11/23/2010  . Bursitis of hip 10/26/2010  . Hyperlipidemia 06/27/2010  . IMMUNE THROMBOCYTOPENIC PURPURA 06/27/2010  . Unspecified glaucoma 06/27/2010  . GERD 06/27/2010  . Irritable bowel syndrome 06/27/2010  . OVERACTIVE BLADDER 06/27/2010  . OSTEOARTHRITIS, KNEE, RIGHT 06/27/2010  . URINARY INCONTINENCE 06/27/2010    Current Outpatient Medications on File Prior to Visit  Medication Sig Dispense Refill  . acetaminophen (TYLENOL) 325 MG tablet Take 650 mg by mouth every 6 (six) hours as needed for fever.    . Biotin 2500 MCG CAPS Take 1 capsule by mouth daily.     . Calcium-Magnesium-Vitamin D (CITRACAL  CALCIUM+D) 600-40-500 MG-MG-UNIT TB24 Take 1 tablet by mouth daily.     . feeding supplement, ENSURE ENLIVE, (ENSURE ENLIVE) LIQD Take 237 mLs by mouth 2 (two) times daily between meals. 237 mL 12  . Flaxseed MISC Take 5 mLs by mouth daily. Take 1 tsp daily    . furosemide (LASIX) 20 MG tablet Take 1 tablet (20 mg total) by mouth daily. 90 tablet 3  . latanoprost (XALATAN) 0.005 % ophthalmic solution  Place 1 drop into both eyes at bedtime.    . lidocaine (LIDODERM) 5 % Place 2 patches onto the skin daily. Remove & Discard patch within 12 hours or as directed by MD 1 patch 0  . metoprolol tartrate (LOPRESSOR) 25 MG tablet Take 0.5 tablets (12.5 mg total) by mouth 2 (two) times daily. 60 tablet 1  . Multiple Vitamin (MULTIVITAMIN) tablet Take 1 tablet by mouth daily.      . ondansetron (ZOFRAN ODT) 4 MG disintegrating tablet Take 1 tablet (4 mg total) by mouth every 8 (eight) hours as needed for nausea or vomiting. 20 tablet 0  . pantoprazole (PROTONIX) 40 MG tablet Take 1 tablet (40 mg total) by mouth daily. 30 tablet 0  . polyethylene glycol (MIRALAX / GLYCOLAX) packet Take 17 g by mouth daily as needed for moderate constipation.     . predniSONE (DELTASONE) 2.5 MG tablet Take 1-2 tablets (2.5-5 mg total) by mouth every other day. Take 2 tablets (5mg ) on 09/23/2017, then 1 tablet (2.5mg ) on 09/24/2017, then back to home regimen of every other day. 30 tablet 0  . Probiotic Product (PROBIOTIC FORMULA PO) Take 1 tablet by mouth daily.      [name] triamcinolone cream (KENALOG) 0.1 % Apply 1 application topically 2 (two) times daily.  1   No current facility-administered medications on file prior to visit.     Past Medical History:  Diagnosis Date  . Anxiety   . CAD (coronary artery disease)    RCA 40% stenosis cath 01/2011  . Cholelithiasis    s/p lap chole 09/2014  . Diverticular stricture [facility]) 2006   [facility]  . DIVERTICULITIS, HX OF   . Diverticulosis   . DYSLIPIDEMIA   . Elevated LFTs   . GERD   . Glaucoma   . Hepatic steatosis   . HOH (hard of hearing)   . Immune thrombocytopenic purpura (HCC)    chronic - baseline 80-100K, on pred  . Irritable bowel syndrome   . Left ovarian cyst dx 01/2013 CT   working with gyn, ?malignant - elevated tumor marker OVA1  . OSTEOARTHRITIS, KNEE, RIGHT   . OSTEOPENIA   . OVERACTIVE BLADDER   . UNSPECIFIED PERIPHERAL VASCULAR  DISEASE   . URINARY INCONTINENCE     Past Surgical History:  Procedure Laterality Date  . ABDOMINAL HYSTERECTOMY  1963  . ANGIOPLASTY    . APPENDECTOMY  1956  . CARDIAC CATHETERIZATION    . CATARACT EXTRACTION, BILATERAL  10/2010  . CHOLECYSTECTOMY N/A 09/16/2014   Procedure: LAPAROSCOPIC CHOLECYSTECTOMY ;  Surgeon: Emelia Loron, MD;  Location: [facility] OR;  Service: General;  Laterality: N/A;  . KNEE ARTHROSCOPY Right   . L pop PTA  10/2009   stent  . LAPAROSCOPIC SIGMOID COLECTOMY  10/2005  . SPLENECTOMY  1954  . VARICOSE VEIN SURGERY Right 1962    Social History   Socioeconomic History  . Marital status: Widowed    Spouse name: Not on file  . Number of children: Not  on file  . Years of education: Not on file  . Highest education level: Not on file  Occupational History  . Occupation: Retired    Comment: Programmer, multimedia  Social Needs  . Financial resource strain: Not on file  . Food insecurity:    Worry: Not on file    Inability: Not on file  . Transportation needs:    Medical: Not on file    Non-medical: Not on file  Tobacco Use  . Smoking status: Never Smoker  . Smokeless tobacco: Never Used  Substance and Sexual Activity  . Alcohol use: No    Alcohol/week: 0.0 oz    Comment: rarely  . Drug use: No  . Sexual activity: Not on file  Lifestyle  . Physical activity:    Days per week: Not on file    Minutes per session: Not on file  . Stress: Not on file  Relationships  . Social connections:    Talks on phone: Not on file    Gets together: Not on file    Attends religious service: Not on file    Active member of club or organization: Not on file    Attends meetings of clubs or organizations: Not on file    Relationship status: Not on file  Other Topics Concern  . 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    [place] to [place] from [place] 05/2010 to be close to kids    Family History  Problem  Relation Age of Onset  . Coronary artery disease Mother   . Heart attack Mother 11  . Hyperlipidemia Mother   . Hypertension Mother   . Stomach cancer Father   . Hypertension Daughter   . Hyperlipidemia Daughter   . Arthritis Unknown        parent  . Transient ischemic attack Unknown        parent  . Colon cancer Neg Hx     Review of Systems  Constitutional: Positive for appetite change (improved, normal) and fatigue. Negative for chills and fever.  Respiratory: Negative for cough, shortness of breath and wheezing.   Cardiovascular: Positive for leg swelling. Negative for chest pain and palpitations.  Gastrointestinal: Positive for abdominal distention (pressure and bloating). Negative for abdominal pain, blood in stool and nausea.       Today she had a normal BM; no gerd  Neurological: Negative for light-headedness and headaches.       Objective:   Vitals:   10/02/17 1310  BP: (!) 148/68  Pulse: 76  Resp: 16  Temp: 97.6 F (36.4 C)  SpO2: 98%   BP Readings from Last 3 Encounters:  10/02/17 (!) 148/68  09/25/17 132/70  09/22/17 (!) 163/68   Wt Readings from Last 3 Encounters:  10/02/17 140 lb (63.5 kg)  09/25/17 138 lb 2 oz (62.7 kg)  09/21/17 139 lb 1.8 oz (63.1 kg)   Body mass index is 25.61 kg/m.   Physical Exam    Constitutional: Appears well-developed and well-nourished. No distress.  HENT:  Head: Normocephalic and atraumatic.  Neck: Neck supple. No tracheal deviation present. No thyromegaly present.  No cervical lymphadenopathy Cardiovascular: Normal rate, regular rhythm and normal heart sounds.   No murmur heard. No carotid bruit .  No edema Pulmonary/Chest: Effort normal and breath sounds normal. No respiratory distress. No has no wheezes. No rales.  Abdomen:  Soft, ND, mildly diffuse tenderness w/o rebound  or guarding Skin: Skin is warm and dry. Not diaphoretic.  Psychiatric: Normal mood and affect. Behavior is normal.    DG Abd 2  Views CLINICAL DATA:  Bowel obstruction, follow-up, constipation for several days, lower abdominal pain, prior splenectomy, hysterectomy and partial small-bowel resection  EXAM: ABDOMEN - 2 VIEW  COMPARISON:  09/21/2017  FINDINGS: Increased stool in proximal half of colon.  Small amount of retained contrast in rectum.  Anastomotic staple line at rectosigmoid colon.  Few air-filled normal sized small bowel loops in LEFT lower quadrant, improved.  No bowel dilatation or bowel wall thickening.  No free air.  Lung bases clear.  Bones demineralized with biconvex thoracolumbar scoliosis.  IMPRESSION: Increased stool in the proximal half of the colon.  Resolution of small bowel distention since prior exam.  Electronically Signed   By: Ulyses Southward M.D.   On: 09/26/2017 08:30    Assessment & Plan:    See Problem List for Assessment and Plan of chronic medical problems.

## 2017-10-02 ENCOUNTER — Encounter: Payer: Self-pay | Admitting: Internal Medicine

## 2017-10-02 ENCOUNTER — Ambulatory Visit (INDEPENDENT_AMBULATORY_CARE_PROVIDER_SITE_OTHER): Payer: Medicare HMO | Admitting: Internal Medicine

## 2017-10-02 DIAGNOSIS — K56609 Unspecified intestinal obstruction, unspecified as to partial versus complete obstruction: Secondary | ICD-10-CM

## 2017-10-02 DIAGNOSIS — D693 Immune thrombocytopenic purpura: Secondary | ICD-10-CM | POA: Diagnosis not present

## 2017-10-02 DIAGNOSIS — I10 Essential (primary) hypertension: Secondary | ICD-10-CM | POA: Diagnosis not present

## 2017-10-02 DIAGNOSIS — D72829 Elevated white blood cell count, unspecified: Secondary | ICD-10-CM | POA: Diagnosis not present

## 2017-10-02 DIAGNOSIS — K219 Gastro-esophageal reflux disease without esophagitis: Secondary | ICD-10-CM

## 2017-10-02 NOTE — Assessment & Plan Note (Signed)
Resolved Stool was normal this morning, but only had one normal stool since d/c Generalized abdominal tenderness - mild Increase fiber in diet miralax as needed

## 2017-10-02 NOTE — Assessment & Plan Note (Signed)
Stable on prednisone 2.5 mg daily

## 2017-10-02 NOTE — Patient Instructions (Addendum)
   Medications reviewed and updated.  No changes recommended at this time.   Please followup in 3 months    

## 2017-10-02 NOTE — Assessment & Plan Note (Signed)
Well controlled at home Continue current medication at current dose Continue to monitor at home

## 2017-10-02 NOTE — Assessment & Plan Note (Signed)
Was reactive resolved

## 2017-10-02 NOTE — Assessment & Plan Note (Signed)
GERD controlled Continue daily medication  

## 2017-10-07 ENCOUNTER — Encounter: Payer: Self-pay | Admitting: Internal Medicine

## 2017-10-07 NOTE — Telephone Encounter (Signed)
I dont see in health maintenance when she is due.

## 2017-10-14 ENCOUNTER — Other Ambulatory Visit: Payer: Self-pay | Admitting: Internal Medicine

## 2017-10-14 DIAGNOSIS — Z1231 Encounter for screening mammogram for malignant neoplasm of breast: Secondary | ICD-10-CM

## 2017-10-15 ENCOUNTER — Telehealth: Payer: Self-pay | Admitting: Oncology

## 2017-10-15 ENCOUNTER — Inpatient Hospital Stay: Payer: Medicare HMO

## 2017-10-15 ENCOUNTER — Inpatient Hospital Stay: Payer: Medicare HMO | Attending: Oncology | Admitting: Oncology

## 2017-10-15 VITALS — BP 127/54 | HR 72 | Temp 98.0°F | Resp 16 | Ht 62.0 in | Wt 141.6 lb

## 2017-10-15 DIAGNOSIS — Z79899 Other long term (current) drug therapy: Secondary | ICD-10-CM | POA: Diagnosis not present

## 2017-10-15 DIAGNOSIS — D693 Immune thrombocytopenic purpura: Secondary | ICD-10-CM | POA: Diagnosis not present

## 2017-10-15 DIAGNOSIS — M818 Other osteoporosis without current pathological fracture: Secondary | ICD-10-CM | POA: Diagnosis not present

## 2017-10-15 LAB — CBC WITH DIFFERENTIAL/PLATELET
Basophils Absolute: 0 10*3/uL (ref 0.0–0.1)
Basophils Relative: 0 %
Eosinophils Absolute: 0.2 10*3/uL (ref 0.0–0.5)
Eosinophils Relative: 3 %
HEMATOCRIT: 40.6 % (ref 34.8–46.6)
Hemoglobin: 13.2 g/dL (ref 11.6–15.9)
LYMPHS ABS: 4.1 10*3/uL — AB (ref 0.9–3.3)
LYMPHS PCT: 43 %
MCH: 28 pg (ref 25.1–34.0)
MCHC: 32.5 g/dL (ref 31.5–36.0)
MCV: 86 fL (ref 79.5–101.0)
MONOS PCT: 14 %
Monocytes Absolute: 1.3 10*3/uL — ABNORMAL HIGH (ref 0.1–0.9)
NEUTROS ABS: 3.7 10*3/uL (ref 1.5–6.5)
NEUTROS PCT: 40 %
Platelets: 166 10*3/uL (ref 145–400)
RBC: 4.72 MIL/uL (ref 3.70–5.45)
RDW: 14.3 % (ref 11.2–14.5)
WBC: 9.4 10*3/uL (ref 3.9–10.3)

## 2017-10-15 NOTE — Telephone Encounter (Signed)
Appointments scheduled AVS/Calendar printed per 6/11 los °

## 2017-10-15 NOTE — Progress Notes (Signed)
Hematology and Oncology Follow Up Visit  Tamara Robertson [id number] 02/03/1930 85 y.o. 10/15/2017 12:57 PM   Principle Diagnosis: 83 year old with chronic ITP that has been in remission since the year 2000.  Her initial diagnosis dates back to the 28s.  Prior Therapy:  She is S/P splenectomy and subsequently treated with high doses of steroids. The patient have had a complete response to steroids back in the 60s and all the way have had a few relapses. Every time she has a relapse she gets restarted on high-dose of steroids and she achieved a complete response. The most recent of relapses before her move to [facility] she was hospitalized for a platelet count of 8000 around the year 2000.  Current therapy: Prednisone to 2.5 mg every other day.   Interim History: Tamara Robertson presents today for a follow-up.  Since the last visit, she was hospitalized back in May for a low-grade small bowel obstruction and was discharged on Sep 22, 2017.  She was also hospitalized back in March with pneumonia and sepsis which has resolved at this time.  She is improving slowly and have regained most activities of daily living.  She has returned to driving and continues to live independently.  She denies any active bleeding including epistaxis, hemoptysis or easy bruising.  He remains on low-dose prednisone every other day for maintenance purposes.   She has not reported any headaches or blurry vision or double vision.  She denies any alteration in mental status or confusion.  She denies any fevers or chills sweats.  Has not reported any chest pain cough or hemoptysis. Has not reported any nausea or vomiting or abdominal pain.  He denies any hematochezia or melena.  Has not reported any frequency urgency or hematuria. She does not report any arthralgias or myalgias.  She denies any lymphadenopathy or petechiae.  She denies any skin rashes or lesions.  Rest or view of systems is negative.   Medications: I have reviewed the  patient's current medications.  Current Outpatient Medications  Medication Sig Dispense Refill  . acetaminophen (TYLENOL) 325 MG tablet Take 650 mg by mouth every 6 (six) hours as needed for fever.    . Biotin 2500 MCG CAPS Take 1 capsule by mouth daily.     . Calcium-Magnesium-Vitamin D (CITRACAL CALCIUM+D) 600-40-500 MG-MG-UNIT TB24 Take 1 tablet by mouth daily.     . feeding supplement, ENSURE ENLIVE, (ENSURE ENLIVE) LIQD Take 237 mLs by mouth 2 (two) times daily between meals. 237 mL 12  . Flaxseed MISC Take 5 mLs by mouth daily. Take 1 tsp daily    . furosemide (LASIX) 20 MG tablet Take 1 tablet (20 mg total) by mouth daily. 90 tablet 3  . latanoprost (XALATAN) 0.005 % ophthalmic solution Place 1 drop into both eyes at bedtime.    . lidocaine (LIDODERM) 5 % Place 2 patches onto the skin daily. Remove & Discard patch within 12 hours or as directed by MD 1 patch 0  . metoprolol tartrate (LOPRESSOR) 25 MG tablet Take 0.5 tablets (12.5 mg total) by mouth 2 (two) times daily. 60 tablet 1  . Multiple Vitamin (MULTIVITAMIN) tablet Take 1 tablet by mouth daily.      . ondansetron (ZOFRAN ODT) 4 MG disintegrating tablet Take 1 tablet (4 mg total) by mouth every 8 (eight) hours as needed for nausea or vomiting. 20 tablet 0  . pantoprazole (PROTONIX) 20 MG tablet Take 20 mg by mouth daily.    . polyethylene  glycol (MIRALAX / GLYCOLAX) packet Take 17 g by mouth daily as needed for moderate constipation.     . predniSONE (DELTASONE) 2.5 MG tablet Take 1-2 tablets (2.5-5 mg total) by mouth every other day. Take 2 tablets (5mg ) on 09/23/2017, then 1 tablet (2.5mg ) on 09/24/2017, then back to home regimen of every other day. (Patient taking differently: Take 2.5-5 mg by mouth every other day. Take 1 tablet every other day.) 30 tablet 0  . Probiotic Product (PROBIOTIC FORMULA PO) Take 1 tablet by mouth daily.      [name] triamcinolone cream (KENALOG) 0.1 % Apply 1 application topically 2 (two) times daily.  1   No  current facility-administered medications for this visit.     Allergies:  Allergies  Allergen Reactions  . Aspirin Other (See Comments)    ITP  . Sulfa Antibiotics Other (See Comments)    dizziness  . Contrast Media [Iodinated Diagnostic Agents] Hives  . Statins Other (See Comments)    Past Medical History, Surgical history, Social history, and Family History were reviewed and updated.  Physical Exam: Blood pressure (!) 127/54, pulse 72, temperature 98 F (36.7 C), temperature source Oral, resp. rate 16, height 5\' 2"  (1.575 m), weight 141 lb 9.6 oz (64.2 kg), SpO2 100 %.   ECOG: 1 General appearance: Alert, awake woman without distress. Head: Atraumatic without abnormalities. Oropharynx: Without any thrush or ulcers. Eyes: No scleral icterus. Lymph nodes: No lymphadenopathy noted in the cervical, supraclavicular, or axillary nodesl. Heart:regular rate and rhythm without any murmurs or gallops. Lung: Clear to auscultation without any rhonchi or wheezes or dullness to percussion. Abdomin: Soft, nontender without rebound or guarding. Musculoskeletal: No joint deformity or effusion. Neurological: No motor or sensory deficits. Skin: No ecchymosis or petechiae.    Lab Results: Lab Results  Component Value Date   WBC 9.7 09/21/2017   HGB 12.3 09/21/2017   HCT 37.6 09/21/2017   MCV 85.6 09/21/2017   PLT 158 09/21/2017     Chemistry      Component Value Date/Time   NA 137 09/22/2017 0416   NA 142 02/16/2016 1526   K 4.4 09/22/2017 0416   K 4.3 02/16/2016 1526   CL 108 09/22/2017 0416   CL 105 04/17/2012 1452   CO2 19 (L) 09/22/2017 0416   CO2 27 02/16/2016 1526   BUN 21 (H) 09/22/2017 0416   BUN 14.3 02/16/2016 1526   CREATININE 0.77 09/22/2017 0416   CREATININE 0.8 02/16/2016 1526      Component Value Date/Time   CALCIUM 8.6 (L) 09/22/2017 0416   CALCIUM 9.5 02/16/2016 1526   ALKPHOS 75 09/20/2017 1151   ALKPHOS 92 02/16/2016 1526   AST 24 09/20/2017 1151    AST 19 02/16/2016 1526   ALT 15 09/20/2017 1151   ALT 11 02/16/2016 1526   BILITOT 0.8 09/20/2017 1151   BILITOT 0.33 02/16/2016 1526      Impression and Plan:   84 year old woman with:  1.  ITP that is chronic in nature and has been in remission since 2000.  Her laboratory data from 10/15/2017 were reviewed today and showed no evidence of relapse at this time.  She reports no active bleeding or easy bruising.  Risks and benefits of continuing oral prednisone at a lower dose was reviewed today and she is agreeable to continue.  I have recommended a return in 6 months to check her labs and follow her progress.  2. Osteoporosis: Remains on calcium and vitamin D without any  pathological fractures.  3.  Small bowel obstruction: Appears to have resolved spontaneously without any surgical intervention.  4. Follow-up: Will be in 6 months.  15  minutes was spent with the patient face-to-face today.  More than 50% of time was dedicated to patient counseling, education and coordination of her care.   Eli Hose, MD 6/11/201912:57 PM

## 2017-10-21 MED FILL — SHINGRIX 50 MCG SUS: 50 | 30 days supply | Qty: 1 | Fill #1

## 2017-10-22 DIAGNOSIS — L602 Onychogryphosis: Secondary | ICD-10-CM | POA: Diagnosis not present

## 2017-10-22 DIAGNOSIS — M79672 Pain in left foot: Secondary | ICD-10-CM | POA: Diagnosis not present

## 2017-10-22 DIAGNOSIS — M79671 Pain in right foot: Secondary | ICD-10-CM | POA: Diagnosis not present

## 2017-10-23 ENCOUNTER — Ambulatory Visit: Payer: Medicare HMO | Admitting: Internal Medicine

## 2017-11-04 DIAGNOSIS — N8110 Cystocele, unspecified: Secondary | ICD-10-CM

## 2017-11-04 HISTORY — DX: Cystocele, unspecified: N81.10

## 2017-11-12 ENCOUNTER — Ambulatory Visit: Payer: Medicare Other | Admitting: Internal Medicine

## 2017-11-13 ENCOUNTER — Ambulatory Visit
Admission: RE | Admit: 2017-11-13 | Discharge: 2017-11-13 | Disposition: A | Discharge status: Home / Self Care | Payer: Medicare HMO | Source: Ambulatory Visit | Attending: Internal Medicine | Admitting: Internal Medicine

## 2017-11-13 DIAGNOSIS — Z1231 Encounter for screening mammogram for malignant neoplasm of breast: Secondary | ICD-10-CM | POA: Diagnosis not present

## 2017-11-15 ENCOUNTER — Ambulatory Visit: Payer: Medicare HMO | Admitting: Obstetrics & Gynecology

## 2017-11-15 ENCOUNTER — Encounter: Payer: Self-pay | Admitting: Obstetrics & Gynecology

## 2017-11-15 VITALS — BP 136/76

## 2017-11-15 DIAGNOSIS — N8110 Cystocele, unspecified: Secondary | ICD-10-CM | POA: Diagnosis not present

## 2017-11-15 NOTE — Progress Notes (Signed)
Tamara Robertson Aug 11, 1929 [id number]        85 y.o.  G3P3L2 Widowed  RP: Bulging in vagina x1 noticed 3 days ago  HPI: S/P Total Hysterectomy.  Patient noticed a bulge from her vagina 3 days ago.  She pushed it back in with her fingers successfully.  Has not noticed the bulge since then.  No pelvic or vaginal pain.  No vaginal discharge.  Mild stress urinary incontinence stable.  No urinary tract infection symptoms.  Patient going from loose stools to tendency for constipation chronically.  No fever.   OB History  Gravida Para Term Preterm AB Living  3 3       2   SAB TAB Ectopic Multiple Live Births               # Outcome Date GA Lbr Len/2nd Weight Sex Delivery Anes PTL Lv  3 Para           2 Para           1 Para             Obstetric Comments  1 child passed away at 4    Past medical history,surgical history, problem list, medications, allergies, family history and social history were all reviewed and documented in the EPIC chart.   Directed ROS with pertinent positives and negatives documented in the history of present illness/assessment and plan.  Exam:  Vitals:   11/15/17 1106  BP: 136/76   General appearance:  Normal  Abdomen: Normal  Gynecologic exam: Vulva normal.  Bimanual exam laying down with Valsalva: Cystocele grade 2/4.  No rectocele.  No colpocele.  Patient examined in standing position with Valsalva: Cystocele grade 3/4.  No rectocele. No colpocele.   Assessment/Plan:  84 y.o. G3P3   1. Baden-Walker grade 2/3 cystocele Patient explained what a cystocele is and the fact that it is not a health concern at this time.  Given that she is very minimally symptomatic, recommendation to observe at this point.  Precautions to avoid rapid progression discussed with patient including avoiding heavy lifting, treating constipation, treating cough and emptying her bladder regularly to avoid overfilling.  Management of more symptomatic cystocele discussed with patient  including pessary and surgery.  Patient will call if symptoms are bothersome and she would like to try the pessary.  Usage of pessary, surgery, risks and benefits of each approach reviewed with patient.  Counseling on above issues and coordination of care more than 50% for 25 minutes.  [name] MD, 11:15 AM 11/15/2017

## 2017-11-15 NOTE — Patient Instructions (Signed)
1. Baden-Walker grade 2/3 cystocele Patient explained what a cystocele is and the fact that it is not a health concern at this time.  Given that she is very minimally symptomatic, recommendation to observe at this point.  Precautions to avoid rapid progression discussed with patient including avoiding heavy lifting, treating constipation, treating cough and emptying her bladder regularly to avoid overfilling.  Management of more symptomatic cystocele discussed with patient including pessary and surgery.  Patient will call if symptoms are bothersome and she would like to try the pessary.  Usage of pessary, surgery, risks and benefits of each approach reviewed with patient.  Tamara Robertson, it was a pleasure as always seeing you today!  About Cystocele  Overview  The pelvic organs, including the bladder, are normally supported by pelvic floor muscles and ligaments.  When these muscles and ligaments are stretched, weakened or torn, the wall between the bladder and the vagina sags or herniates causing a prolapse, sometimes called a cystocele.  This condition may cause discomfort and problems with emptying the bladder.  It can be present in various stages.  Some people are not aware of the changes.  Others may notice changes at the vaginal opening or a feeling of the bladder dropping outside the body.  Causes of a Cystocele  A cystocele is usually caused by muscle straining or stretching during childbirth.  In addition, cystocele is more common after menopause, because the hormone estrogen helps keep the elastic tissues around the pelvic organs strong.  A cystocele is more likely to occur when levels of estrogen decrease.  Other causes include: heavy lifting, chronic coughing, previous pelvic surgery and obesity.  Symptoms  A bladder that has dropped from its normal position may cause: unwanted urine leakage (stress incontinence), frequent urination or urge to urinate, incomplete emptying of the bladder (not  feeling bladder relief after emptying), pain or discomfort in the vagina, pelvis, groin, lower back or lower abdomen and frequent urinary tract infections.  Mild cases may not cause any symptoms.  Treatment Options  Pelvic floor (Kegel) exercises:  Strength training the muscles in your genital area  Behavioral changes: Treating and preventing constipation, taking time to empty your bladder properly, learning to lift properly and/or avoid heavy lifting when possible, stopping smoking, avoiding weight gain and treating a chronic cough or bronchitis.  A pessary: A vaginal support device is sometimes used to help pelvic support caused by muscle and ligament changes.  Surgery: Surgical repair may be necessary if symptoms cannot be managed with exercise, behavioral changes and a pessary.  Surgery is usually considered for severe cases.   2007, [employer]

## 2017-11-19 DIAGNOSIS — H401431 Capsular glaucoma with pseudoexfoliation of lens, bilateral, mild stage: Secondary | ICD-10-CM | POA: Diagnosis not present

## 2017-11-22 ENCOUNTER — Telehealth: Payer: Self-pay | Admitting: *Deleted

## 2017-11-22 NOTE — Telephone Encounter (Signed)
Received call from pt states she is starting to feel loopy again while taking this metoprolol. Pt states sometimes she feels that she is not in control. Dr. Percival Spanish said she can take 1/2 twice a day in which this is what she has been doing. Inform pt MD is out of the office until July 29th, and she will need to be seen to be evaluated. Made appt w/Laura Valere Dross for 11/26/17. Did inform pt is she feel her sxs getting worse over weekend to go to ER for immediate evaluation, and she agreed.Marland KitchenJohny Chess

## 2017-11-26 ENCOUNTER — Ambulatory Visit: Payer: Medicare HMO | Admitting: Family

## 2017-11-29 ENCOUNTER — Ambulatory Visit: Payer: Medicare HMO | Admitting: Family

## 2017-12-11 ENCOUNTER — Ambulatory Visit (INDEPENDENT_AMBULATORY_CARE_PROVIDER_SITE_OTHER): Payer: Medicare HMO | Admitting: *Deleted

## 2017-12-11 VITALS — BP 141/60 | HR 76 | Resp 17 | Ht 62.0 in | Wt 144.0 lb

## 2017-12-11 DIAGNOSIS — Z0000 Encounter for general adult medical examination without abnormal findings: Secondary | ICD-10-CM

## 2017-12-11 NOTE — Patient Instructions (Signed)
Continue doing brain stimulating activities (puzzles, reading, adult coloring books, staying active) to keep memory sharp.   Continue to eat heart healthy diet (full of fruits, vegetables, whole grains, lean protein, water--limit salt, fat, and sugar intake) and increase physical activity as tolerated.   Tamara Robertson , Thank you for taking time to come for your Medicare Wellness Visit. I appreciate your ongoing commitment to your health goals. Please review the following plan we discussed and let me know if I can assist you in the future.   These are the goals we discussed: Goals    . Patient Stated     Increase my physical activity by walking and doing chair exercises during commercials when I am watching TV. Continue to volunteer and stay active in church.       This is a list of the screening recommended for you and due dates:  Health Maintenance  Topic Date Due  . Flu Shot  12/05/2017  . DEXA scan (bone density measurement)  08/23/2019  . Tetanus Vaccine  02/27/2021  . Pneumonia vaccines  Completed   Health Maintenance, Female Adopting a healthy lifestyle and getting preventive care can go a long way to promote health and wellness. Talk with your health care provider about what schedule of regular examinations is right for you. This is a good chance for you to check in with your provider about disease prevention and staying healthy. In between checkups, there are plenty of things you can do on your own. Experts have done a lot of research about which lifestyle changes and preventive measures are most likely to keep you healthy. Ask your health care provider for more information. Weight and diet Eat a healthy diet  Be sure to include plenty of vegetables, fruits, low-fat dairy products, and lean protein.  Do not eat a lot of foods high in solid fats, added sugars, or salt.  Get regular exercise. This is one of the most important things you can do for your health. ? Most adults  should exercise for at least 150 minutes each week. The exercise should increase your heart rate and make you sweat (moderate-intensity exercise). ? Most adults should also do strengthening exercises at least twice a week. This is in addition to the moderate-intensity exercise.  Maintain a healthy weight  Body mass index (BMI) is a measurement that can be used to identify possible weight problems. It estimates body fat based on height and weight. Your health care provider can help determine your BMI and help you achieve or maintain a healthy weight.  For females 80 years of age and older: ? A BMI below 18.5 is considered underweight. ? A BMI of 18.5 to 24.9 is normal. ? A BMI of 25 to 29.9 is considered overweight. ? A BMI of 30 and above is considered obese.  Watch levels of cholesterol and blood lipids  You should start having your blood tested for lipids and cholesterol at 82 years of age, then have this test every 5 years.  You may need to have your cholesterol levels checked more often if: ? Your lipid or cholesterol levels are high. ? You are older than 82 years of age. ? You are at high risk for heart disease.  Cancer screening Lung Cancer  Lung cancer screening is recommended for adults 70-67 years old who are at high risk for lung cancer because of a history of smoking.  A yearly low-dose CT scan of the lungs is recommended for people who: ?  Currently smoke. ? Have quit within the past 15 years. ? Have at least a 30-pack-year history of smoking. A pack year is smoking an average of one pack of cigarettes a day for 1 year.  Yearly screening should continue until it has been 15 years since you quit.  Yearly screening should stop if you develop a health problem that would prevent you from having lung cancer treatment.  Breast Cancer  Practice breast self-awareness. This means understanding how your breasts normally appear and feel.  It also means doing regular breast  self-exams. Let your health care provider know about any changes, no matter how small.  If you are in your 20s or 30s, you should have a clinical breast exam (CBE) by a health care provider every 1-3 years as part of a regular health exam.  If you are 76 or older, have a CBE every year. Also consider having a breast X-ray (mammogram) every year.  If you have a family history of breast cancer, talk to your health care provider about genetic screening.  If you are at high risk for breast cancer, talk to your health care provider about having an MRI and a mammogram every year.  Breast cancer gene (BRCA) assessment is recommended for women who have family members with BRCA-related cancers. BRCA-related cancers include: ? Breast. ? Ovarian. ? Tubal. ? Peritoneal cancers.  Results of the assessment will determine the need for genetic counseling and BRCA1 and BRCA2 testing.  Cervical Cancer Your health care provider may recommend that you be screened regularly for cancer of the pelvic organs (ovaries, uterus, and vagina). This screening involves a pelvic examination, including checking for microscopic changes to the surface of your cervix (Pap test). You may be encouraged to have this screening done every 3 years, beginning at age 43.  For women ages 51-65, health care providers may recommend pelvic exams and Pap testing every 3 years, or they may recommend the Pap and pelvic exam, combined with testing for human papilloma virus (HPV), every 5 years. Some types of HPV increase your risk of cervical cancer. Testing for HPV may also be done on women of any age with unclear Pap test results.  Other health care providers may not recommend any screening for nonpregnant women who are considered low risk for pelvic cancer and who do not have symptoms. Ask your health care provider if a screening pelvic exam is right for you.  If you have had past treatment for cervical cancer or a condition that could  lead to cancer, you need Pap tests and screening for cancer for at least 20 years after your treatment. If Pap tests have been discontinued, your risk factors (such as having a new sexual partner) need to be reassessed to determine if screening should resume. Some women have medical problems that increase the chance of getting cervical cancer. In these cases, your health care provider may recommend more frequent screening and Pap tests.  Colorectal Cancer  This type of cancer can be detected and often prevented.  Routine colorectal cancer screening usually begins at 82 years of age and continues through 82 years of age.  Your health care provider may recommend screening at an earlier age if you have risk factors for colon cancer.  Your health care provider may also recommend using home test kits to check for hidden blood in the stool.  A small camera at the end of a tube can be used to examine your colon directly (sigmoidoscopy or colonoscopy).  This is done to check for the earliest forms of colorectal cancer.  Routine screening usually begins at age 56.  Direct examination of the colon should be repeated every 5-10 years through 82 years of age. However, you may need to be screened more often if early forms of precancerous polyps or small growths are found.  Skin Cancer  Check your skin from head to toe regularly.  Tell your health care provider about any new moles or changes in moles, especially if there is a change in a mole's shape or color.  Also tell your health care provider if you have a mole that is larger than the size of a pencil eraser.  Always use sunscreen. Apply sunscreen liberally and repeatedly throughout the day.  Protect yourself by wearing long sleeves, pants, a wide-brimmed hat, and sunglasses whenever you are outside.  Heart disease, diabetes, and high blood pressure  High blood pressure causes heart disease and increases the risk of stroke. High blood pressure  is more likely to develop in: ? People who have blood pressure in the high end of the normal range (130-139/85-89 mm Hg). ? People who are overweight or obese. ? People who are African American.  If you are 3-68 years of age, have your blood pressure checked every 3-5 years. If you are 61 years of age or older, have your blood pressure checked every year. You should have your blood pressure measured twice-once when you are at a hospital or clinic, and once when you are not at a hospital or clinic. Record the average of the two measurements. To check your blood pressure when you are not at a hospital or clinic, you can use: ? An automated blood pressure machine at a pharmacy. ? A home blood pressure monitor.  If you are between 22 years and 7 years old, ask your health care provider if you should take aspirin to prevent strokes.  Have regular diabetes screenings. This involves taking a blood sample to check your fasting blood sugar level. ? If you are at a normal weight and have a low risk for diabetes, have this test once every three years after 82 years of age. ? If you are overweight and have a high risk for diabetes, consider being tested at a younger age or more often. Preventing infection Hepatitis B  If you have a higher risk for hepatitis B, you should be screened for this virus. You are considered at high risk for hepatitis B if: ? You were born in a country where hepatitis B is common. Ask your health care provider which countries are considered high risk. ? Your parents were born in a high-risk country, and you have not been immunized against hepatitis B (hepatitis B vaccine). ? You have HIV or AIDS. ? You use needles to inject street drugs. ? You live with someone who has hepatitis B. ? You have had sex with someone who has hepatitis B. ? You get hemodialysis treatment. ? You take certain medicines for conditions, including cancer, organ transplantation, and autoimmune  conditions.  Hepatitis C  Blood testing is recommended for: ? Everyone born from [birth year] through 1963. ? Anyone with known risk factors for hepatitis C.  Sexually transmitted infections (STIs)  You should be screened for sexually transmitted infections (STIs) including gonorrhea and chlamydia if: ? You are sexually active and are younger than 82 years of age. ? You are older than 82 years of age and your health care provider tells you that  you are at risk for this type of infection. ? Your sexual activity has changed since you were last screened and you are at an increased risk for chlamydia or gonorrhea. Ask your health care provider if you are at risk.  If you do not have HIV, but are at risk, it may be recommended that you take a prescription medicine daily to prevent HIV infection. This is called pre-exposure prophylaxis (PrEP). You are considered at risk if: ? You are sexually active and do not regularly use condoms or know the HIV status of your partner(s). ? You take drugs by injection. ? You are sexually active with a partner who has HIV.  Talk with your health care provider about whether you are at high risk of being infected with HIV. If you choose to begin PrEP, you should first be tested for HIV. You should then be tested every 3 months for as long as you are taking PrEP. Pregnancy  If you are premenopausal and you may become pregnant, ask your health care provider about preconception counseling.  If you may become pregnant, take 400 to 800 micrograms (mcg) of folic acid every day.  If you want to prevent pregnancy, talk to your health care provider about birth control (contraception). Osteoporosis and menopause  Osteoporosis is a disease in which the bones lose minerals and strength with aging. This can result in serious bone fractures. Your risk for osteoporosis can be identified using a bone density scan.  If you are 26 years of age or older, or if you are at risk for  osteoporosis and fractures, ask your health care provider if you should be screened.  Ask your health care provider whether you should take a calcium or vitamin D supplement to lower your risk for osteoporosis.  Menopause may have certain physical symptoms and risks.  Hormone replacement therapy may reduce some of these symptoms and risks. Talk to your health care provider about whether hormone replacement therapy is right for you. Follow these instructions at home:  Schedule regular health, dental, and eye exams.  Stay current with your immunizations.  Do not use any tobacco products including cigarettes, chewing tobacco, or electronic cigarettes.  If you are pregnant, do not drink alcohol.  If you are breastfeeding, limit how much and how often you drink alcohol.  Limit alcohol intake to no more than 1 drink per day for nonpregnant women. One drink equals 12 ounces of beer, 5 ounces of Dedra Matsuo, or 1 ounces of hard liquor.  Do not use street drugs.  Do not share needles.  Ask your health care provider for help if you need support or information about quitting drugs.  Tell your health care provider if you often feel depressed.  Tell your health care provider if you have ever been abused or do not feel safe at home. This information is not intended to replace advice given to you by your health care provider. Make sure you discuss any questions you have with your health care provider. Document Released: 11/06/2010 Document Revised: 09/29/2015 Document Reviewed: 01/25/2015 Elsevier Interactive Patient Education  [employer].  It is important to avoid accidents which may result in broken bones.  Here are a few ideas on how to make your home safer so you will be less likely to trip or fall.  1. Use nonskid mats or non slip strips in your shower or tub, on your bathroom floor and around sinks.  If you know that you have spilled water,  wipe it up! 2. In the bathroom, it is  important to have properly installed grab bars on the walls or on the edge of the tub.  Towel racks are NOT strong enough for you to hold onto or to pull on for support. 3. Stairs and hallways should have enough light.  Add lamps or night lights if you need ore light. 4. It is good to have handrails on both sides of the stairs if possible.  Always fix broken handrails right away. 5. It is important to see the edges of steps.  Paint the edges of outdoor steps white so you can see them better.  Put colored tape on the edge of inside steps. 6. Throw-rugs are dangerous because they can slide.  Removing the rugs is the best idea, but if they must stay, add adhesive carpet tape to prevent slipping. 7. Do not keep things on stairs or in the halls.  Remove small furniture that blocks the halls as it may cause you to trip.  Keep telephone and electrical cords out of the way where you walk. 8. Always were sturdy, rubber-soled shoes for good support.  Never wear just socks, especially on the stairs.  Socks may cause you to slip or fall.  Do not wear full-length housecoats as you can easily trip on the bottom.  9. Place the things you use the most on the shelves that are the easiest to reach.  If you use a stepstool, make sure it is in good condition.  If you feel unsteady, DO NOT climb, ask for help. 10. If a health professional advises you to use a cane or walker, do not be ashamed.  These items can keep you from falling and breaking your bones.

## 2017-12-11 NOTE — Progress Notes (Addendum)
Subjective:   Tamara Robertson is a 84 y.o. female who presents for Medicare Annual (Subsequent) preventive examination.  Patient discussed feeling depressed. States that at times she wishes that she would have passed away when she was very ill in the hospital last February. Denies having any suicidal ideations.   Review of Systems:  No ROS.  Medicare Wellness Visit. Additional risk factors are reflected in the social history.  Cardiac Risk Factors include: advanced age (>50men, >70 women);dyslipidemia;hypertension Sleep patterns: gets up 1 times nightly to void and sleeps 7-8 hours nightly.    Home Safety/Smoke Alarms: Feels safe in home. Smoke alarms in place.  Living environment; residence and Firearm Safety: 1-story house/ trailer, no firearms. Lives alone, no needs for DME, good support system Seat Belt Safety/Bike Helmet: Wears seat belt.    Objective:     Vitals: BP (!) 141/60   Pulse 76   Resp 17   Ht 5\' 2"  (1.575 m)   Wt 144 lb (65.3 kg)   SpO2 98%   BMI 26.34 kg/m   Body mass index is 26.34 kg/m.  Advanced Directives 12/11/2017 09/20/2017 09/20/2017 06/26/2017 06/25/2017 03/05/2017 01/16/2017  Does Patient Have a Medical Advance Directive? Yes Yes Yes Yes Yes Yes Yes  Type of Paramedic of [place];Living will [place];Living will Living will;Healthcare Power of [place];Living will [place];Living will [place];Living will [place];Living will  Does patient want to make changes to medical advance directive? - No - Patient declined - No - Patient declined - - -  Copy of [place] in Chart? No - copy requested No - copy requested - No - copy requested - No - copy requested No - copy requested  Would patient like information on creating a medical advance directive? - - - - - - -    Tobacco Social History   Tobacco Use    Smoking Status Never Smoker  Smokeless Tobacco Never Used     Counseling given: Not Answered  Past Medical History:  Diagnosis Date  . Anxiety   . CAD (coronary artery disease)    RCA 40% stenosis cath 01/2011  . Cholelithiasis    s/p lap chole 09/2014  . Diverticular stricture [facility]) 2006   [facility]  . DIVERTICULITIS, HX OF   . Diverticulosis   . DYSLIPIDEMIA   . Elevated LFTs   . GERD   . Glaucoma   . Hepatic steatosis   . HOH (hard of hearing)   . Immune thrombocytopenic purpura (HCC)    chronic - baseline 80-100K, on pred  . Irritable bowel syndrome   . Left ovarian cyst dx 01/2013 CT   working with gyn, ?malignant - elevated tumor marker OVA1  . OSTEOARTHRITIS, KNEE, RIGHT   . OSTEOPENIA   . OVERACTIVE BLADDER   . UNSPECIFIED PERIPHERAL VASCULAR DISEASE   . URINARY INCONTINENCE    Past Surgical History:  Procedure Laterality Date  . ABDOMINAL HYSTERECTOMY  1963  . ANGIOPLASTY    . APPENDECTOMY  1956  . CARDIAC CATHETERIZATION    . CATARACT EXTRACTION, BILATERAL  10/2010  . CHOLECYSTECTOMY N/A 09/16/2014   Procedure: LAPAROSCOPIC CHOLECYSTECTOMY ;  Surgeon: Rolm Bookbinder, MD;  Location: [place];  Service: General;  Laterality: N/A;  . KNEE ARTHROSCOPY Right   . L pop PTA  10/2009   stent  . LAPAROSCOPIC SIGMOID COLECTOMY  10/2005  .  SPLENECTOMY  1954  . VARICOSE VEIN SURGERY Right 1962   Family History  Problem Relation Age of Onset  . Coronary artery disease Mother   . Heart attack Mother 32  . Hyperlipidemia Mother   . Hypertension Mother   . Stomach cancer Father   . Hypertension Daughter   . Hyperlipidemia Daughter   . Arthritis Unknown        parent  . Transient ischemic attack Unknown        parent  . Colon cancer Neg Hx    Social History   Socioeconomic History  . Marital status: Widowed    Spouse name: Not on file  . Number of children: 2  . Years of education: Not on file  . Highest education level: Not on file   Occupational History  . Occupation: Retired    Comment: Retail banker  Social Needs  . Financial resource strain: Not hard at all  . Food insecurity:    Worry: Never true    Inability: Never true  . Transportation needs:    Medical: No    Non-medical: No  Tobacco Use  . Smoking status: Never Smoker  . Smokeless tobacco: Never Used  Substance and Sexual Activity  . Alcohol use: No    Alcohol/week: 0.0 oz    Comment: rarely  . Drug use: No  . Sexual activity: Not Currently    Comment: 1st intercourse- 19, partners- 2,  widow  Lifestyle  . Physical activity:    Days per week: 0 days    Minutes per session: 0 min  . Stress: Only a little  Relationships  . Social connections:    Talks on phone: More than three times a week    Gets together: More than three times a week    Attends religious service: More than 4 times per year    Active member of club or organization: Yes    Attends meetings of clubs or organizations: More than 4 times per year    Relationship status: Widowed  Other Topics Concern  . 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    Dorie Rank to [place] from [place] 05/2010 to be close to kids    Outpatient Encounter Medications as of 12/11/2017  Medication Sig  . acetaminophen (TYLENOL) 325 MG tablet Take 650 mg by mouth every 6 (six) hours as needed for fever.  . Biotin 2500 MCG CAPS Take 1 capsule by mouth daily.   . Calcium-Magnesium-Vitamin D (CITRACAL CALCIUM+D) 600-40-500 MG-MG-UNIT TB24 Take 1 tablet by mouth daily.   . feeding supplement, ENSURE ENLIVE, (ENSURE ENLIVE) LIQD Take 237 mLs by mouth 2 (two) times daily between meals.  . Flaxseed MISC Take 5 mLs by mouth daily. Take 1 tsp daily  . furosemide (LASIX) 20 MG tablet Take 1 tablet (20 mg total) by mouth daily.  [name] latanoprost (XALATAN) 0.005 % ophthalmic solution Place 1 drop into both eyes at bedtime.  . lidocaine (LIDODERM) 5 % Place  2 patches onto the skin daily. Remove & Discard patch within 12 hours or as directed by MD  . metoprolol tartrate (LOPRESSOR) 25 MG tablet Take 0.5 tablets (12.5 mg total) by mouth 2 (two) times daily. (Patient taking differently: Take 12.5 mg by mouth 2 (two) times daily. )  . Multiple Vitamin (MULTIVITAMIN) tablet Take 1 tablet by mouth daily.    . ondansetron (ZOFRAN ODT) 4 MG disintegrating tablet Take 1  tablet (4 mg total) by mouth every 8 (eight) hours as needed for nausea or vomiting.  . pantoprazole (PROTONIX) 20 MG tablet Take 20 mg by mouth daily.  . polyethylene glycol (MIRALAX / GLYCOLAX) packet Take 17 g by mouth daily as needed for moderate constipation.   . predniSONE (DELTASONE) 2.5 MG tablet Take 1-2 tablets (2.5-5 mg total) by mouth every other day. Take 2 tablets (5mg ) on 09/23/2017, then 1 tablet (2.5mg ) on 09/24/2017, then back to home regimen of every other day. (Patient taking differently: Take 2.5-5 mg by mouth every other day. Take 1 tablet every other day.)  . Probiotic Product (PROBIOTIC FORMULA PO) Take 1 tablet by mouth daily.    [name] triamcinolone cream (KENALOG) 0.1 % Apply 1 application topically 2 (two) times daily.   No facility-administered encounter medications on file as of 12/11/2017.     Activities of Daily Living In your present state of health, do you have any difficulty performing the following activities: 12/11/2017 09/20/2017  Hearing? N N  Vision? N N  Difficulty concentrating or making decisions? N N  Comment - -  Walking or climbing stairs? N N  Dressing or bathing? N N  Doing errands, shopping? N N  Preparing Food and eating ? N -  Using the Toilet? N -  In the past six months, have you accidently leaked urine? N -  Do you have problems with loss of bowel control? N -  Managing your Medications? N -  Managing your Finances? N -  Housekeeping or managing your Housekeeping? N -  Some recent data might be hidden    Patient Care Team: Binnie Rail,  MD as PCP - General (Internal Medicine) Almedia Balls, MD as Consulting Physician (Orthopedic Surgery) [name], MD as Consulting Physician (Cardiology) Rolm Bookbinder, MD as Consulting Physician (Dermatology) Wyatt Portela, MD (Hematology and Oncology) Nancy Marus, MD (Gynecologic Oncology) Philemon Kingdom, MD (Endocrinology) Pyrtle, Lajuan Lines, MD as Consulting Physician (Gastroenterology)    Assessment:   This is a routine wellness examination for Nyeli. Physical assessment deferred to PCP.   Exercise Activities and Dietary recommendations Current Exercise Habits: Home exercise routine(chair and balance exercise print-outs provided), Type of exercise: walking;yoga, Time (Minutes): 30, Frequency (Times/Week): 2, Weekly Exercise (Minutes/Week): 60, Intensity: Mild, Exercise limited by: None identified  Diet (meal preparation, eat out, water intake, caffeinated beverages, dairy products, fruits and vegetables): in general, a "healthy" diet  , on average, 2 meals per day. Reports poor appetite, states she supplements with premier 1-2 times daily.  Reviewed heart healthy diet. Encouraged patient to increase daily water and healthy fluid intake.  Goals    . Patient Stated     Increase my physical activity by walking and doing chair exercises during commercials when I am watching TV. Continue to volunteer and stay active in church.       Fall Risk Fall Risk  12/11/2017 12/11/2016 12/07/2016 12/04/2016 12/01/2015  Falls in the past year? Yes No No No No  Number falls in past yr: 1 - - - -  Injury with Fall? No - - - -  Risk Factor Category  - - - - -  Risk for fall due to : Impaired balance/gait - - - -  Follow up Education provided;Falls prevention discussed - - - -    Depression Screen PHQ 2/9 Scores 12/11/2017 12/11/2016 12/07/2016 12/04/2016  PHQ - 2 Score 5 1 2 1   PHQ- 9 Score 13 3 6  3  Cognitive Function MMSE - Mini Mental State Exam 12/11/2017 12/04/2016  Orientation to time 5 5    Orientation to Place 5 5  Registration 3 3  Attention/ Calculation 5 5  Recall 3 1  Language- name 2 objects 2 2  Language- repeat 1 1  Language- follow 3 step command 3 3  Language- read & follow direction 1 1  Write a sentence 1 1  Copy design 1 1  Total score 30 28        Immunization History  Administered Date(s) Administered  . Influenza Split 02/04/2014, 02/14/2015  . Influenza-Unspecified 02/12/2013, 02/06/2016, 02/04/2017  . Pneumococcal Conjugate-13 08/24/2014  . Pneumococcal Polysaccharide-23 02/28/2011  . Td 02/28/2011  . Zoster 04/07/2007  . Zoster Recombinat (Shingrix) 05/23/2017, 10/25/2017   Screening Tests Health Maintenance  Topic Date Due  . INFLUENZA VACCINE  12/05/2017  . DEXA SCAN  08/23/2019  . TETANUS/TDAP  02/27/2021  . PNA vac Low Risk Adult  Completed      Plan:      Patient has an appointment scheduled with PCP 12/18/17. She states she does want to discuss feelings of depression with Dr. Quay Burow and is open to being treated with anti-depressant medications. Nurse will inform provider of patient's depressed feelings.   Continue doing brain stimulating activities (puzzles, reading, adult coloring books, staying active) to keep memory sharp.   Continue to eat heart healthy diet (full of fruits, vegetables, whole grains, lean protein, water--limit salt, fat, and sugar intake) and increase physical activity as tolerated.  I have personally reviewed and noted the following in the patient's chart:   . Medical and social history . Use of alcohol, tobacco or illicit drugs  . Current medications and supplements . Functional ability and status . Nutritional status . Physical activity . Advanced directives . List of other physicians . Vitals . Screenings to include cognitive, depression, and falls . Referrals and appointments  In addition, I have reviewed and discussed with patient certain preventive protocols, quality metrics, and best practice  recommendations. A written personalized care plan for preventive services as well as general preventive health recommendations were provided to patient.     Michiel Cowboy, RN  12/11/2017    Medical screening examination/treatment/procedure(s) were performed by non-physician practitioner and as supervising physician I was immediately available for consultation/collaboration. I agree with above. Binnie Rail, MD

## 2017-12-17 NOTE — Progress Notes (Deleted)
Subjective:    Patient ID: Tamara Robertson, female    DOB: 11/04/1929, 84 y.o.   MRN: [id number]  HPI The patient is here for follow up.  Hypertension: She is taking her medication daily. She is compliant with a low sodium diet.  She denies chest pain, palpitations, edema, shortness of breath and regular headaches. She is exercising regularly.  She does not monitor her blood pressure at home.    Depression:  Prediabetes:  She is compliant with a low sugar/carbohydrate diet.  She is exercising regularly.   Medications and allergies reviewed with patient and updated if appropriate.  Patient Active Problem List   Diagnosis Date Noted  . SBO (small bowel obstruction) (HCC) 09/20/2017  . Leukocytosis   . Aortic stenosis, moderate 08/22/2017  . CAD (coronary artery disease) 08/21/2017  . Anemia 07/23/2017  . Pleural effusion on right   . Streptococcal pneumonia (HCC) 06/26/2017  . Community acquired pneumonia of right lower lobe of lung (HCC) 06/25/2017  . Cough 06/25/2017  . Current chronic use of systemic steroids 06/25/2017  . Chronic lower back pain 05/14/2017  . Varicose veins of left lower extremity with complications 03/05/2017  . Bilateral hearing loss 02/01/2017  . Hypertension 07/31/2016  . Leg edema 07/31/2016  . Prediabetes 06/05/2016  . Osteopenia 12/01/2015  . Anxiety 07/19/2015  . Degenerative cervical disc 12/28/2014  . Left ovarian cyst   . Peripheral neuropathy 06/03/2012  . Insomnia   . PVD (peripheral vascular disease) (HCC) 11/23/2010  . Bursitis of hip 10/26/2010  . Hyperlipidemia 06/27/2010  . IMMUNE THROMBOCYTOPENIC PURPURA 06/27/2010  . Unspecified glaucoma 06/27/2010  . GERD 06/27/2010  . Irritable bowel syndrome 06/27/2010  . OVERACTIVE BLADDER 06/27/2010  . OSTEOARTHRITIS, KNEE, RIGHT 06/27/2010  . URINARY INCONTINENCE 06/27/2010    Current Outpatient Medications on File Prior to Visit  Medication Sig Dispense Refill  . acetaminophen  (TYLENOL) 325 MG tablet Take 650 mg by mouth every 6 (six) hours as needed for fever.    . Biotin 2500 MCG CAPS Take 1 capsule by mouth daily.     . Calcium-Magnesium-Vitamin D (CITRACAL CALCIUM+D) 600-40-500 MG-MG-UNIT TB24 Take 1 tablet by mouth daily.     . feeding supplement, ENSURE ENLIVE, (ENSURE ENLIVE) LIQD Take 237 mLs by mouth 2 (two) times daily between meals. 237 mL 12  . Flaxseed MISC Take 5 mLs by mouth daily. Take 1 tsp daily    . furosemide (LASIX) 20 MG tablet Take 1 tablet (20 mg total) by mouth daily. 90 tablet 3  . latanoprost (XALATAN) 0.005 % ophthalmic solution Place 1 drop into both eyes at bedtime.    . lidocaine (LIDODERM) 5 % Place 2 patches onto the skin daily. Remove & Discard patch within 12 hours or as directed by MD 1 patch 0  . metoprolol tartrate (LOPRESSOR) 25 MG tablet Take 0.5 tablets (12.5 mg total) by mouth 2 (two) times daily. (Patient taking differently: Take 12.5 mg by mouth 2 (two) times daily. ) 60 tablet 1  . Multiple Vitamin (MULTIVITAMIN) tablet Take 1 tablet by mouth daily.      . ondansetron (ZOFRAN ODT) 4 MG disintegrating tablet Take 1 tablet (4 mg total) by mouth every 8 (eight) hours as needed for nausea or vomiting. 20 tablet 0  . pantoprazole (PROTONIX) 20 MG tablet Take 20 mg by mouth daily.    . polyethylene glycol (MIRALAX / GLYCOLAX) packet Take 17 g by mouth daily as needed for moderate constipation.     [name]  predniSONE (DELTASONE) 2.5 MG tablet Take 1-2 tablets (2.5-5 mg total) by mouth every other day. Take 2 tablets (5mg ) on 09/23/2017, then 1 tablet (2.5mg ) on 09/24/2017, then back to home regimen of every other day. (Patient taking differently: Take 2.5-5 mg by mouth every other day. Take 1 tablet every other day.) 30 tablet 0  . Probiotic Product (PROBIOTIC FORMULA PO) Take 1 tablet by mouth daily.      [name] triamcinolone cream (KENALOG) 0.1 % Apply 1 application topically 2 (two) times daily.  1   No current facility-administered medications  on file prior to visit.     Past Medical History:  Diagnosis Date  . Anxiety   . CAD (coronary artery disease)    RCA 40% stenosis cath 01/2011  . Cholelithiasis    s/p lap chole 09/2014  . Diverticular stricture [facility]) 2006   [facility]  . DIVERTICULITIS, HX OF   . Diverticulosis   . DYSLIPIDEMIA   . Elevated LFTs   . GERD   . Glaucoma   . Hepatic steatosis   . HOH (hard of hearing)   . Immune thrombocytopenic purpura (HCC)    chronic - baseline 80-100K, on pred  . Irritable bowel syndrome   . Left ovarian cyst dx 01/2013 CT   working with gyn, ?malignant - elevated tumor marker OVA1  . OSTEOARTHRITIS, KNEE, RIGHT   . OSTEOPENIA   . OVERACTIVE BLADDER   . UNSPECIFIED PERIPHERAL VASCULAR DISEASE   . URINARY INCONTINENCE     Past Surgical History:  Procedure Laterality Date  . ABDOMINAL HYSTERECTOMY  1963  . ANGIOPLASTY    . APPENDECTOMY  1956  . CARDIAC CATHETERIZATION    . CATARACT EXTRACTION, BILATERAL  10/2010  . CHOLECYSTECTOMY N/A 09/16/2014   Procedure: LAPAROSCOPIC CHOLECYSTECTOMY ;  Surgeon: Emelia Loron, MD;  Location: [facility] OR;  Service: General;  Laterality: N/A;  . KNEE ARTHROSCOPY Right   . L pop PTA  10/2009   stent  . LAPAROSCOPIC SIGMOID COLECTOMY  10/2005  . SPLENECTOMY  1954  . VARICOSE VEIN SURGERY Right 1962    Social History   Socioeconomic History  . Marital status: Widowed    Spouse name: Not on file  . Number of children: 2  . Years of education: Not on file  . Highest education level: Not on file  Occupational History  . Occupation: Retired    Comment: Programmer, multimedia  Social Needs  . Financial resource strain: Not hard at all  . Food insecurity:    Worry: Never true    Inability: Never true  . Transportation needs:    Medical: No    Non-medical: No  Tobacco Use  . Smoking status: Never Smoker  . Smokeless tobacco: Never Used  Substance and Sexual Activity  . Alcohol use: No    Alcohol/week: 0.0  standard drinks    Comment: rarely  . Drug use: No  . Sexual activity: Not Currently    Comment: 1st intercourse- 19, partners- 2,  widow  Lifestyle  . Physical activity:    Days per week: 0 days    Minutes per session: 0 min  . Stress: Only a little  Relationships  . Social connections:    Talks on phone: More than three times a week    Gets together: More than three times a week    Attends religious service: More than 4 times per year    Active member of club or organization: Yes  Attends meetings of clubs or organizations: More than 4 times per year    Relationship status: Widowed  Other Topics Concern  . 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    [place] Ham to [place] from [place] 05/2010 to be close to kids    Family History  Problem Relation Age of Onset  . Coronary artery disease Mother   . Heart attack Mother 40  . Hyperlipidemia Mother   . Hypertension Mother   . Stomach cancer Father   . Hypertension Daughter   . Hyperlipidemia Daughter   . Arthritis Unknown        parent  . Transient ischemic attack Unknown        parent  . Colon cancer Neg Hx     Review of Systems     Objective:  There were no vitals filed for this visit. BP Readings from Last 3 Encounters:  12/11/17 (!) 141/60  11/15/17 136/76  10/15/17 (!) 127/54   Wt Readings from Last 3 Encounters:  12/11/17 144 lb (65.3 kg)  10/15/17 141 lb 9.6 oz (64.2 kg)  10/02/17 140 lb (63.5 kg)   There is no height or weight on file to calculate BMI.   Physical Exam    Constitutional: Appears well-developed and well-nourished. No distress.  HENT:  Head: Normocephalic and atraumatic.  Neck: Neck supple. No tracheal deviation present. No thyromegaly present.  No cervical lymphadenopathy Cardiovascular: Normal rate, regular rhythm and normal heart sounds.   No murmur heard. No carotid bruit .  No edema Pulmonary/Chest: Effort normal and breath  sounds normal. No respiratory distress. No has no wheezes. No rales.  Skin: Skin is warm and dry. Not diaphoretic.  Psychiatric: Normal mood and affect. Behavior is normal.      Assessment & Plan:    See Problem List for Assessment and Plan of chronic medical problems.

## 2017-12-18 ENCOUNTER — Other Ambulatory Visit (INDEPENDENT_AMBULATORY_CARE_PROVIDER_SITE_OTHER): Payer: Medicare HMO

## 2017-12-18 ENCOUNTER — Ambulatory Visit (INDEPENDENT_AMBULATORY_CARE_PROVIDER_SITE_OTHER): Payer: Medicare HMO | Admitting: Internal Medicine

## 2017-12-18 ENCOUNTER — Ambulatory Visit: Payer: Medicare HMO | Admitting: Internal Medicine

## 2017-12-18 ENCOUNTER — Encounter: Payer: Self-pay | Admitting: Internal Medicine

## 2017-12-18 VITALS — BP 142/76 | HR 78 | Temp 97.9°F | Resp 16 | Wt 144.0 lb

## 2017-12-18 DIAGNOSIS — F32A Depression, unspecified: Secondary | ICD-10-CM | POA: Insufficient documentation

## 2017-12-18 DIAGNOSIS — D693 Immune thrombocytopenic purpura: Secondary | ICD-10-CM | POA: Diagnosis not present

## 2017-12-18 DIAGNOSIS — F329 Major depressive disorder, single episode, unspecified: Secondary | ICD-10-CM | POA: Insufficient documentation

## 2017-12-18 DIAGNOSIS — I10 Essential (primary) hypertension: Secondary | ICD-10-CM

## 2017-12-18 DIAGNOSIS — R7303 Prediabetes: Secondary | ICD-10-CM | POA: Diagnosis not present

## 2017-12-18 DIAGNOSIS — R69 Illness, unspecified: Secondary | ICD-10-CM | POA: Diagnosis not present

## 2017-12-18 DIAGNOSIS — I2510 Atherosclerotic heart disease of native coronary artery without angina pectoris: Secondary | ICD-10-CM

## 2017-12-18 DIAGNOSIS — F3289 Other specified depressive episodes: Secondary | ICD-10-CM

## 2017-12-18 LAB — CBC WITH DIFFERENTIAL/PLATELET
Basophils Absolute: 0.1 10*3/uL (ref 0.0–0.1)
Basophils Relative: 0.9 % (ref 0.0–3.0)
Eosinophils Absolute: 0.1 10*3/uL (ref 0.0–0.7)
Eosinophils Relative: 0.9 % (ref 0.0–5.0)
HEMATOCRIT: 40.2 % (ref 36.0–46.0)
Hemoglobin: 13.5 g/dL (ref 12.0–15.0)
LYMPHS PCT: 28.9 % (ref 12.0–46.0)
Lymphs Abs: 3 10*3/uL (ref 0.7–4.0)
MCHC: 33.5 g/dL (ref 30.0–36.0)
MCV: 83.2 fl (ref 78.0–100.0)
Monocytes Absolute: 0.7 10*3/uL (ref 0.1–1.0)
Monocytes Relative: 7.2 % (ref 3.0–12.0)
NEUTROS ABS: 6.4 10*3/uL (ref 1.4–7.7)
NEUTROS PCT: 62.1 % (ref 43.0–77.0)
PLATELETS: 173 10*3/uL (ref 150.0–400.0)
RBC: 4.84 Mil/uL (ref 3.87–5.11)
RDW: 13.8 % (ref 11.5–15.5)
WBC: 10.4 10*3/uL (ref 4.0–10.5)

## 2017-12-18 LAB — COMPREHENSIVE METABOLIC PANEL
ALBUMIN: 4.4 g/dL (ref 3.5–5.2)
ALT: 12 U/L (ref 0–35)
AST: 18 U/L (ref 0–37)
Alkaline Phosphatase: 75 U/L (ref 39–117)
BUN: 21 mg/dL (ref 6–23)
CALCIUM: 9.9 mg/dL (ref 8.4–10.5)
CO2: 33 meq/L — AB (ref 19–32)
Chloride: 101 mEq/L (ref 96–112)
Creatinine, Ser: 0.87 mg/dL (ref 0.40–1.20)
GFR: 65.57 mL/min (ref >60.00)
Glucose, Bld: 104 mg/dL — ABNORMAL HIGH (ref 70–99)
Potassium: 4.5 mEq/L (ref 3.5–5.1)
Sodium: 141 mEq/L (ref 135–145)
Total Bilirubin: 0.4 mg/dL (ref 0.2–1.2)
Total Protein: 7.7 g/dL (ref 6.0–8.3)

## 2017-12-18 LAB — HEMOGLOBIN A1C: Hgb A1c MFr Bld: 6 % (ref 4.6–6.5)

## 2017-12-18 MED ORDER — FLUOXETINE HCL 10 MG PO CAPS: 10.0000 mg | ORAL_CAPSULE | Freq: Every day | ORAL | 5 refills | Status: DC

## 2017-12-18 MED ORDER — LOSARTAN POTASSIUM 25 MG PO TABS: 25.0000 mg | ORAL_TABLET | Freq: Every day | ORAL | 5 refills | Status: DC

## 2017-12-18 NOTE — Progress Notes (Signed)
Subjective:    Patient ID: Tamara Robertson, female    DOB: 08/23/1929, 84 y.o.   MRN: [id number]  HPI The patient is here for follow up.  Headaches:  About two week she started having pain in her temples ad a creepy crawling sensation in her head.  She started having migraines again - had 4 in one week.  She used to have them.  She had two nosebleeds during that time.  She has not had any since then.  The headaches go away with tylenol or sinus tab.    Depression: She did discuss with the wellness nurse that she was having some depression.  Since she was very sick earlier this year she has been depressed.  She has thoughts that she wished she did not make it through this illness.    She does not feel that she has a true purpose.  She does not understand why she is still here.    CAD, Hypertension: She is taking her medication daily, but decreased her medication because of low BP's - 110/49 and she had lightheadedness. She is now only taking 12.5 mg of metoprolol once a day.  She is compliant with a low sodium diet.  She denies chest pain, palpitations, edema, shortness of breath and regular headaches. She is exercising regularly, but not as much as she used to.    At home her bp is 109/49 - 133/59.  Prediabetes:  She is compliant with a low sugar/carbohydrate diet.  She is exercising regularly.  ITP: She is taking 2.5 mg of prednisone every other day.  She denies any abnormal bruising or bleeding, except the nosebleeds.  Prolapsed bladder:  She did see her gyn.  She discussed a pessary with her vs surgery.    Medications and allergies reviewed with patient and updated if appropriate.  Patient Active Problem List   Diagnosis Date Noted  . SBO (small bowel obstruction) (HCC) 09/20/2017  . Aortic stenosis, moderate 08/22/2017  . CAD (coronary artery disease) 08/21/2017  . Pleural effusion on right   . Streptococcal pneumonia (HCC) 06/26/2017  . Community acquired pneumonia of right lower  lobe of lung (HCC) 06/25/2017  . Current chronic use of systemic steroids 06/25/2017  . Chronic lower back pain 05/14/2017  . Varicose veins of left lower extremity with complications 03/05/2017  . Bilateral hearing loss 02/01/2017  . Hypertension 07/31/2016  . Leg edema 07/31/2016  . Prediabetes 06/05/2016  . Osteopenia 12/01/2015  . Anxiety 07/19/2015  . Degenerative cervical disc 12/28/2014  . Left ovarian cyst   . Peripheral neuropathy 06/03/2012  . PVD (peripheral vascular disease) (HCC) 11/23/2010  . Bursitis of hip 10/26/2010  . Hyperlipidemia 06/27/2010  . IMMUNE THROMBOCYTOPENIC PURPURA 06/27/2010  . Unspecified glaucoma 06/27/2010  . GERD 06/27/2010  . Irritable bowel syndrome 06/27/2010  . OVERACTIVE BLADDER 06/27/2010  . OSTEOARTHRITIS, KNEE, RIGHT 06/27/2010  . URINARY INCONTINENCE 06/27/2010    Current Outpatient Medications on File Prior to Visit  Medication Sig Dispense Refill  . acetaminophen (TYLENOL) 325 MG tablet Take 650 mg by mouth every 6 (six) hours as needed for fever.    . Biotin 2500 MCG CAPS Take 1 capsule by mouth daily.     . Calcium-Magnesium-Vitamin D (CITRACAL CALCIUM+D) 600-40-500 MG-MG-UNIT TB24 Take 1 tablet by mouth daily.     . feeding supplement, ENSURE ENLIVE, (ENSURE ENLIVE) LIQD Take 237 mLs by mouth 2 (two) times daily between meals. 237 mL 12  . Flaxseed MISC Take  5 mLs by mouth daily. Take 1 tsp daily    . furosemide (LASIX) 20 MG tablet Take 1 tablet (20 mg total) by mouth daily. 90 tablet 3  . latanoprost (XALATAN) 0.005 % ophthalmic solution Place 1 drop into both eyes at bedtime.    . lidocaine (LIDODERM) 5 % Place 2 patches onto the skin daily. Remove & Discard patch within 12 hours or as directed by MD 1 patch 0  . metoprolol tartrate (LOPRESSOR) 25 MG tablet Take 0.5 tablets (12.5 mg total) by mouth 2 (two) times daily. (Patient taking differently: Take 12.5 mg by mouth 2 (two) times daily. ) 60 tablet 1  . Multiple Vitamin  (MULTIVITAMIN) tablet Take 1 tablet by mouth daily.      . ondansetron (ZOFRAN ODT) 4 MG disintegrating tablet Take 1 tablet (4 mg total) by mouth every 8 (eight) hours as needed for nausea or vomiting. 20 tablet 0  . pantoprazole (PROTONIX) 20 MG tablet Take 20 mg by mouth daily.    . polyethylene glycol (MIRALAX / GLYCOLAX) packet Take 17 g by mouth daily as needed for moderate constipation.     . predniSONE (DELTASONE) 2.5 MG tablet Take 1-2 tablets (2.5-5 mg total) by mouth every other day. Take 2 tablets (5mg ) on 09/23/2017, then 1 tablet (2.5mg ) on 09/24/2017, then back to home regimen of every other day. (Patient taking differently: Take 2.5-5 mg by mouth every other day. Take 1 tablet every other day.) 30 tablet 0  . Probiotic Product (PROBIOTIC FORMULA PO) Take 1 tablet by mouth daily.      [name] triamcinolone cream (KENALOG) 0.1 % Apply 1 application topically 2 (two) times daily.  1   No current facility-administered medications on file prior to visit.     Past Medical History:  Diagnosis Date  . Anxiety   . CAD (coronary artery disease)    RCA 40% stenosis cath 01/2011  . Cholelithiasis    s/p lap chole 09/2014  . Diverticular stricture [facility] 2006   [facility]  . DIVERTICULITIS, HX OF   . Diverticulosis   . DYSLIPIDEMIA   . Elevated LFTs   . GERD   . Glaucoma   . Hepatic steatosis   . HOH (hard of hearing)   . Immune thrombocytopenic purpura (HCC)    chronic - baseline 80-100K, on pred  . Irritable bowel syndrome   . Left ovarian cyst dx 01/2013 CT   working with gyn, ?malignant - elevated tumor marker OVA1  . OSTEOARTHRITIS, KNEE, RIGHT   . OSTEOPENIA   . OVERACTIVE BLADDER   . UNSPECIFIED PERIPHERAL VASCULAR DISEASE   . URINARY INCONTINENCE     Past Surgical History:  Procedure Laterality Date  . ABDOMINAL HYSTERECTOMY  1963  . ANGIOPLASTY    . APPENDECTOMY  1956  . CARDIAC CATHETERIZATION    . CATARACT EXTRACTION, BILATERAL  10/2010  .  CHOLECYSTECTOMY N/A 09/16/2014   Procedure: LAPAROSCOPIC CHOLECYSTECTOMY ;  Surgeon: Emelia Loron, MD;  [facility];  Service: General;  Laterality: N/A;  . KNEE ARTHROSCOPY Right   . L pop PTA  10/2009   stent  . LAPAROSCOPIC SIGMOID COLECTOMY  10/2005  . SPLENECTOMY  1954  . VARICOSE VEIN SURGERY Right 1962    Social History   Socioeconomic History  . Marital status: Widowed    Spouse name: Not on file  . Number of children: 2  . Years of education: Not on file  . Highest education level: Not on  file  Occupational History  . Occupation: Retired    Comment: Programmer, multimedia  Social Needs  . Financial resource strain: Not hard at all  . Food insecurity:    Worry: Never true    Inability: Never true  . Transportation needs:    Medical: No    Non-medical: No  Tobacco Use  . Smoking status: Never Smoker  . Smokeless tobacco: Never Used  Substance and Sexual Activity  . Alcohol use: No    Alcohol/week: 0.0 standard drinks    Comment: rarely  . Drug use: No  . Sexual activity: Not Currently    Comment: 1st intercourse- 19, partners- 2,  widow  Lifestyle  . Physical activity:    Days per week: 0 days    Minutes per session: 0 min  . Stress: Only a little  Relationships  . Social connections:    Talks on phone: More than three times a week    Gets together: More than three times a week    Attends religious service: More than 4 times per year    Active member of club or organization: Yes    Attends meetings of clubs or organizations: More than 4 times per year    Relationship status: Widowed  Other Topics Concern  . 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    [place] to [place] from [place] 05/2010 to be close to kids    Family History  Problem Relation Age of Onset  . Coronary artery disease Mother   . Heart attack Mother 71  . Hyperlipidemia Mother   . Hypertension Mother   . Stomach cancer  Father   . Hypertension Daughter   . Hyperlipidemia Daughter   . Arthritis Unknown        parent  . Transient ischemic attack Unknown        parent  . Colon cancer Neg Hx     Review of Systems  Constitutional: Negative for fever.  Respiratory: Negative for cough, shortness of breath and wheezing.   Cardiovascular: Negative for chest pain, palpitations and leg swelling.  Neurological: Positive for light-headedness and headaches.       Objective:   Vitals:   12/18/17 1314  BP: (!) 142/76  Pulse: 78  Resp: 16  Temp: 97.9 F (36.6 C)  SpO2: 98%   BP Readings from Last 3 Encounters:  12/18/17 (!) 142/76  12/11/17 (!) 141/60  11/15/17 136/76   Wt Readings from Last 3 Encounters:  12/18/17 144 lb (65.3 kg)  12/11/17 144 lb (65.3 kg)  10/15/17 141 lb 9.6 oz (64.2 kg)   Body mass index is 26.34 kg/m.   Physical Exam    Constitutional: Appears well-developed and well-nourished. No distress.  HENT:  Head: Normocephalic and atraumatic.  Neck: Neck supple. No tracheal deviation present. No thyromegaly present.  No cervical lymphadenopathy Cardiovascular: Normal rate, regular rhythm and normal heart sounds.   No murmur heard. No carotid bruit .  No edema Pulmonary/Chest: Effort normal and breath sounds normal. No respiratory distress. No has no wheezes. No rales.  Skin: Skin is warm and dry. Not diaphoretic.  Psychiatric: Normal mood and affect. Behavior is normal.      Assessment & Plan:    See Problem List for Assessment and Plan of chronic medical problems.

## 2017-12-18 NOTE — Assessment & Plan Note (Addendum)
Taking prednisone 2.5 mg every other day No abnormal bruising or bleeding, except nosebleeds Continue

## 2017-12-18 NOTE — Patient Instructions (Addendum)
  Test(s) ordered today. Your results will be released to [facility] (or called to you) after review, usually within 72hours after test completion. If any changes need to be made, you will be notified at that same time.   Medications reviewed and updated.  Changes include stopping metoprolol and starting losartan 25 mg daily.  Also start generic prozac 10 mg daily.   Your prescription(s) have been submitted to your pharmacy. Please take as directed and contact our office if you believe you are having problem(s) with the medication(s).   Please followup in 1 month

## 2017-12-18 NOTE — Assessment & Plan Note (Signed)
She is compliant with a low sugar/carbohydrate diet and exercising regularly A1c has been well controlled

## 2017-12-18 NOTE — Assessment & Plan Note (Signed)
Blood pressure controlled at home, but still having some lightheadedness and lower blood pressures She is only taking the metoprolol once a day and we cannot decrease this further Will discontinue metoprolol and try losartan 25 mg daily She will continue to monitor her blood pressure and call with questions or concerns Follow-up in 1 month

## 2017-12-18 NOTE — Assessment & Plan Note (Signed)
This is a new diagnosis.  She denies any suicidal thoughts or anxiety Part of the reason she is depressed is because she was very ill and is unsure why she did not die and what her purpose is now.  There are many things that she is not able to do that she wishes she could She is lonely and wishes she had a companion I do think she would benefit from medication Trial of fluoxetine 10 mg daily, follow-up in 1 month and will titrate medication if needed

## 2017-12-18 NOTE — Assessment & Plan Note (Signed)
No chest pain, palpitations or shortness of breath Continue current medications

## 2017-12-19 ENCOUNTER — Encounter: Payer: Self-pay | Admitting: Internal Medicine

## 2017-12-24 ENCOUNTER — Other Ambulatory Visit: Payer: Self-pay | Admitting: Cardiology

## 2017-12-25 NOTE — Telephone Encounter (Signed)
Rx sent to pharmacy   

## 2017-12-31 DIAGNOSIS — H401132 Primary open-angle glaucoma, bilateral, moderate stage: Secondary | ICD-10-CM | POA: Diagnosis not present

## 2017-12-31 DIAGNOSIS — H16223 Keratoconjunctivitis sicca, not specified as Sjogren's, bilateral: Secondary | ICD-10-CM | POA: Diagnosis not present

## 2018-01-01 ENCOUNTER — Ambulatory Visit: Payer: Medicare HMO | Admitting: Internal Medicine

## 2018-01-01 DIAGNOSIS — L821 Other seborrheic keratosis: Secondary | ICD-10-CM | POA: Diagnosis not present

## 2018-01-01 DIAGNOSIS — B078 Other viral warts: Secondary | ICD-10-CM | POA: Diagnosis not present

## 2018-01-01 DIAGNOSIS — D485 Neoplasm of uncertain behavior of skin: Secondary | ICD-10-CM | POA: Diagnosis not present

## 2018-01-01 DIAGNOSIS — L820 Inflamed seborrheic keratosis: Secondary | ICD-10-CM | POA: Diagnosis not present

## 2018-01-01 DIAGNOSIS — Z85828 Personal history of other malignant neoplasm of skin: Secondary | ICD-10-CM | POA: Diagnosis not present

## 2018-01-14 ENCOUNTER — Ambulatory Visit (HOSPITAL_COMMUNITY): Payer: Medicare HMO

## 2018-01-20 ENCOUNTER — Ambulatory Visit: Payer: Medicare HMO | Admitting: Obstetrics

## 2018-01-21 ENCOUNTER — Ambulatory Visit: Payer: Medicare HMO | Admitting: Internal Medicine

## 2018-01-22 ENCOUNTER — Ambulatory Visit (HOSPITAL_COMMUNITY)
Admission: RE | Admit: 2018-01-22 | Discharge: 2018-01-22 | Disposition: A | Discharge status: Home / Self Care | Payer: Medicare HMO | Source: Ambulatory Visit | Attending: Gynecologic Oncology | Admitting: Gynecologic Oncology

## 2018-01-22 DIAGNOSIS — R1900 Intra-abdominal and pelvic swelling, mass and lump, unspecified site: Secondary | ICD-10-CM

## 2018-01-22 DIAGNOSIS — N83202 Unspecified ovarian cyst, left side: Secondary | ICD-10-CM | POA: Diagnosis not present

## 2018-01-22 DIAGNOSIS — N838 Other noninflammatory disorders of ovary, fallopian tube and broad ligament: Secondary | ICD-10-CM | POA: Diagnosis not present

## 2018-01-24 ENCOUNTER — Encounter: Payer: Self-pay | Admitting: Obstetrics

## 2018-01-24 ENCOUNTER — Inpatient Hospital Stay: Payer: Medicare HMO | Attending: Obstetrics | Admitting: Obstetrics

## 2018-01-24 VITALS — BP 141/60 | HR 76 | Temp 97.7°F | Resp 20 | Ht 62.0 in | Wt 145.0 lb

## 2018-01-24 DIAGNOSIS — Z90710 Acquired absence of both cervix and uterus: Secondary | ICD-10-CM | POA: Insufficient documentation

## 2018-01-24 DIAGNOSIS — N83202 Unspecified ovarian cyst, left side: Secondary | ICD-10-CM | POA: Insufficient documentation

## 2018-01-24 NOTE — Patient Instructions (Addendum)
1. Return in one year with ultrasound prior 2. Call the office in June of  2020 at [phone] to schedule the [place] and make your appointment.

## 2018-01-24 NOTE — Progress Notes (Signed)
[place] [facility]   Progress Note : Established Patient FOLLOW-UP  Tamara Robertson 84 y.o. female  CC:  Chief Complaint  Patient presents with  . Left ovarian cyst    HPI: Tamara Robertson is a gravida 3 para 2 aborta 1   Interval History She saw Dr. Alycia Rossetti ~ 1 year ago for an adnexal mass/cyst. Final plans were for continued observation. She returns today after repeat ultrasound for management recommendations. She is feeling well. She has ocassional pain in the LLQ with constipation episodes, but otherwise does not notice that she has a mass/cyst in that adnexa. Denies N/V.  Does note bladder prolapse that she is actively followed by Dr. Dellis Filbert for and has an exam due in the next 2 weeks.  We are reviewing her ultrasound today.   Presenting History :  She noticed a change in her bowels and that she had narrow bowel movements about every 3 weeks and then her bowels returned to normal. She was also experiencing some right lower quadrant pain. The patient states she has a history of spastic colon and IBS. However she cannot tolerate bowel prep for colonoscopy she underwent a CT scan of the abdomen and pelvis which revealed:  CT ABDOMEN AND PELVIS WITH CONTRAST   Lung bases are clear.  8 mm hypervascular lesion in the inferior right hepatic lobe (series 2/ image 35), likely reflecting a flash filling hemangioma. Prior splenectomy. Pancreas and adrenal glands are within normal limits. Gallbladder is unremarkable. No intrahepatic or extrahepatic ductal dilatation. Right renal cysts, including a 2.7 cm anterior upper pole cyst (series 5/ image 16) and a 1.5 cm posterior lower pole cyst (series 5/ image 7). Left kidney is within normal limits. No hydronephrosis. No evidence of bowel obstruction. Appendix is not discretely visualized and may be surgically absent. Prior sigmoid resection with anastomosis in the left pelvis (series 2/image 68). Atherosclerotic  calcifications of the abdominal aorta and branch vessels.  No abdominopelvic ascites.  No suspicious abdominopelvic lymphadenopathy.  Status post hysterectomy. 2.8 cm left ovarian cyst (series 2/ image 61). No right adnexal mass. Mild prominence of the mid right ureter (series 2/image 53), although smoothly tapering distally, favored to reflect physiologic laxity.  Bladder is within normal limits. Degenerative changes of the visualized thoracolumbar spine.  IMPRESSION:  No evidence of bowel obstruction.  Prior splenectomy, sigmoidectomy, and hysterectomy. Suspected prior appendectomy. No CT findings to account for the patient's right lower quadrant abdominal pain. 8 mm hypervascular lesion in the inferior right hepatic lobe, likely reflecting a flash filling hemangioma. 2.8 cm left ovarian cyst. Given postmenopausal status, follow-up pelvic ultrasound is suggested in 1 year.  The CT findings prompted an ultrasound and Dr. Assunta Curtis office. This was performed on October 14. 2 small cysts were noted within the region of the left adnexa. There was a 2.9 x 2.7 x 2.0 cm cyst with soft tissue nodule measuring 1.3 x 0.6 x 1.1 cm within the cyst. There's a separate small cysts measuring 1.8 x 1.8 x 1.5 cm simple in appearance. There is no increased vascularity noted. A CA 125 was performed that was normal at 23. However, an OVA-1 test was performed that was slightly elevated at 4.7 with the upper limit of normal for a menopausal woman being 4.4.  Ultrasound 05/25/13: Left ovary  Measurements: 3.5 x 2.4 x 3.3 cm. Two cyst within the left ovary measuring 2.2 x 1.4 x 2.0 cm and 2.3 x 2.3 x  2.5 cm. These either represent 2 adjacent simple cyst with the measurements as above or 1  larger cyst measuring up to 3.5 cm with a thin internal septation. Other findings No free fluid.  IMPRESSION:  Either two adjacent simple cysts or one larger cyst with a thin internal septation as described above. This is almost certainly  benign, but follow up ultrasound is recommended in 1 year according to the Society of Radiologists in [place] Statement (D Clovis Riley et al. Management of Asymptomatic Ovarian and Other Adnexal Cysts Imaged at [place]: Society of Radiologists in [place] Statement 2010. Radiology 256 (Sept 2010): 943-954.).  A CA 125 was also drawn that was normal at 13.8. This is down from 21 on 02/17/2013. She comes in today for followup. She is very pleased with the results of the ultrasound as well as the blood work. She's been completely asymptomatic and has no complaints related to this.  She followed up 01/2017 with Dr. Alycia Rossetti with notes as follows: Measurements: A cystic lesion with a thin internal septation and mild wall irregularity is again seen in the left adnexa. This measures 4.1 x 2.9 by 3.6 cm, and is not significantly changed in size or appearance. This has indeterminate but probably benign characteristics. No abnormal free fluid.  IMPRESSION: 4.1 cm indeterminate but probably benign cystic lesion in left adnexa, without significant change compared to previous studies dating back to 04/06/2015. Differential diagnosis includes cystic ovarian neoplasm, hydrosalpinx, and peritoneal inclusion cyst. Suggest correlation with serum tumor markers, and consider continued yearly followup by ultrasound versus surgical evaluation in a postmenopausal female.  Current Meds:  Outpatient Encounter Medications as of 01/24/2018  Medication Sig  . acetaminophen (TYLENOL) 325 MG tablet Take 650 mg by mouth every 6 (six) hours as needed for fever.  . Biotin 2500 MCG CAPS Take 1 capsule by mouth daily.   . Calcium-Magnesium-Vitamin D (CITRACAL CALCIUM+D) 600-40-500 MG-MG-UNIT TB24 Take 1 tablet by mouth daily.   . feeding supplement, ENSURE ENLIVE, (ENSURE ENLIVE) LIQD Take 237 mLs by mouth 2 (two) times daily between meals.  . Flaxseed MISC Take 5 mLs by mouth daily. Take 1 tsp  daily  . FLUoxetine (PROZAC) 10 MG capsule Take 1 capsule (10 mg total) by mouth daily.  . furosemide (LASIX) 20 MG tablet Take 1 tablet (20 mg total) by mouth daily.  . furosemide (LASIX) 20 MG tablet TAKE 1 TABLET BY MOUTH AS NEEDED  . latanoprost (XALATAN) 0.005 % ophthalmic solution Place 1 drop into both eyes at bedtime.  . lidocaine (LIDODERM) 5 % Place 2 patches onto the skin daily. Remove & Discard patch within 12 hours or as directed by MD  . losartan (COZAAR) 25 MG tablet Take 1 tablet (25 mg total) by mouth daily.  . Multiple Vitamin (MULTIVITAMIN) tablet Take 1 tablet by mouth daily.    . ondansetron (ZOFRAN ODT) 4 MG disintegrating tablet Take 1 tablet (4 mg total) by mouth every 8 (eight) hours as needed for nausea or vomiting.  . pantoprazole (PROTONIX) 20 MG tablet Take 20 mg by mouth daily.  . polyethylene glycol (MIRALAX / GLYCOLAX) packet Take 17 g by mouth daily as needed for moderate constipation.   . predniSONE (DELTASONE) 2.5 MG tablet Take 1-2 tablets (2.5-5 mg total) by mouth every other day. Take 2 tablets (5mg ) on 09/23/2017, then 1 tablet (2.5mg ) on 09/24/2017, then back to home regimen of every other day. (Patient taking differently: Take 2.5-5 mg by mouth every other day.  Take 1 tablet every other day.)  . Probiotic Product (PROBIOTIC FORMULA PO) Take 1 tablet by mouth daily.    [name] triamcinolone cream (KENALOG) 0.1 % Apply 1 application topically as needed.    No facility-administered encounter medications on file as of 01/24/2018.     Allergy:  Allergies  Allergen Reactions  . Aspirin Other (See Comments)    ITP  . Sulfa Antibiotics Other (See Comments)    dizziness  . Contrast Media [Iodinated Diagnostic Agents] Hives  . Statins Other (See Comments)    Social Hx:   Social History   Socioeconomic History  . Marital status: Widowed    Spouse name: Not on file  . Number of children: 2  . Years of education: Not on file  . Highest education level: Not on  file  Occupational History  . Occupation: Retired    Comment: Retail banker  Social Needs  . Financial resource strain: Not hard at all  . Food insecurity:    Worry: Never true    Inability: Never true  . Transportation needs:    Medical: No    Non-medical: No  Tobacco Use  . Smoking status: Never Smoker  . Smokeless tobacco: Never Used  Substance and Sexual Activity  . Alcohol use: No    Alcohol/week: 0.0 standard drinks    Comment: rarely  . Drug use: No  . Sexual activity: Not Currently    Comment: 1st intercourse- 19, partners- 2,  widow  Lifestyle  . Physical activity:    Days per week: 0 days    Minutes per session: 0 min  . Stress: Only a little  Relationships  . Social connections:    Talks on phone: More than three times a week    Gets together: More than three times a week    Attends religious service: More than 4 times per year    Active member of club or organization: Yes    Attends meetings of clubs or organizations: More than 4 times per year    Relationship status: Widowed  . Intimate partner violence:    Fear of current or ex partner: Not on file    Emotionally abused: Not on file    Physically abused: Not on file    Forced sexual activity: Not on file  Other Topics Concern  . 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    Dorie Rank to [place] from [place] 05/2010 to be close to kids    Past Surgical Hx:  Past Surgical History:  Procedure Laterality Date  . ABDOMINAL HYSTERECTOMY  1963  . ANGIOPLASTY    . APPENDECTOMY  1956  . CARDIAC CATHETERIZATION    . CATARACT EXTRACTION, BILATERAL  10/2010  . CHOLECYSTECTOMY N/A 09/16/2014   Procedure: LAPAROSCOPIC CHOLECYSTECTOMY ;  Surgeon: Rolm Bookbinder, MD;  Location: [place];  Service: General;  Laterality: N/A;  . KNEE ARTHROSCOPY Right   . L pop PTA  10/2009   stent  . LAPAROSCOPIC SIGMOID COLECTOMY  10/2005  . SPLENECTOMY  1954  .  VARICOSE VEIN SURGERY Right 1962    Past Medical Hx:  Past Medical History:  Diagnosis Date  . Anxiety   . CAD (coronary artery disease)    RCA 40% stenosis cath 01/2011  . Cholelithiasis    s/p lap chole 09/2014  . Diverticular stricture [facility]) 2006   [facility]  . DIVERTICULITIS, HX OF   .  Diverticulosis   . DYSLIPIDEMIA   . Elevated LFTs   . GERD   . Glaucoma   . Hepatic steatosis   . HOH (hard of hearing)   . Immune thrombocytopenic purpura (HCC)    chronic - baseline 80-100K, on pred  . Irritable bowel syndrome   . Left ovarian cyst dx 01/2013 CT   working with gyn, ?malignant - elevated tumor marker OVA1  . OSTEOARTHRITIS, KNEE, RIGHT   . OSTEOPENIA   . OVERACTIVE BLADDER   . Prolapse of female bladder, acquired 11/2017  . UNSPECIFIED PERIPHERAL VASCULAR DISEASE   . URINARY INCONTINENCE     Oncology Hx:   No history exists.    Family Hx:  Family History  Problem Relation Age of Onset  . Coronary artery disease Mother   . Heart attack Mother 27  . Hyperlipidemia Mother   . Hypertension Mother   . Stomach cancer Father   . Hypertension Daughter   . Hyperlipidemia Daughter   . Arthritis Unknown        parent  . Transient ischemic attack Unknown        parent  . Colon cancer Neg Hx    Review of Systems  Genitourinary: Positive for pelvic pain.   Neurological: Positive for headaches.  All other systems reviewed and are negative. pelvic pain is rare. Also notes bladder prolapse.   Vitals:  Blood pressure (!) 141/60, pulse 76, temperature 97.7 F (36.5 C), temperature source Oral, resp. rate 20, height 5\' 2"  (1.575 m), weight 145 lb (65.8 kg), SpO2 100 %.  Physical Exam:  General :  Well developed, 84 y.o., female in no apparent distress HEENT:  Normocephalic/atraumatic, symmetric, EOMI, eyelids normal Neck:   Supple, no masses.  Lymphatics:  No cervical/ submandibular/ supraclavicular/ infraclavicular/ inguinal  adenopathy Respiratory:  Respirations unlabored, no use of accessory muscles CV:   Deferred Breast:  Deferred Musculoskeletal: No CVA tenderness, normal muscle strength. Abdomen:  Soft, non-tender and nondistended. No evidence of hernia. No masses. Extremities:  No lymphedema, no erythema, non-tender. Skin:   Normal inspection Neuro/Psych:  No focal motor deficit, no abnormal mental status. Normal gait. Normal affect. Alert and oriented to person, place, and time  Genito Urinary: deferred   Assessment/Plan: 84 y.o. status post hysterectomy and right salpingo-oophorectomy at the age of 49 or 69 for advanced endometriosis. She currently has a complex left adnexal mass.CA 125 was normal at 13, however, OVA-1 was slightly elevated at 4.7 with the normal reference range of less than 4.4 and a postmenopausal woman.    She has had close followup since 2016. There has been very little increase in size (when she and I measure in similar axis the prior and current images) and we reviewed her images together today.  She and I both are comfortable with returning in one year for follow-up ultrasound. Signs and symptoms of ovarian cancer have been reviewed with the patient.   She asked me about a pessary today and I encouraged her to consider as this is a reversible therapy with little risk and potential benefit.  She discussed some new headaches she is having and bumps on her skull. Reassurance provided. I encouraged her to write down on a calendar her headaches so she can discuss with her PCP who she sees in about 2 weeks.  Face to face time with patient was 15 minutes. Over 50% of this time was spent on counseling and coordination of care.   Isabel Caprice, MD 01/24/2018, 5:42  PM

## 2018-01-27 NOTE — Progress Notes (Signed)
Subjective:    Patient ID: Tamara Robertson, female    DOB: [birth year], 84 y.o.   MRN: [id number]  HPI The patient is here for follow up.  Headaches:  She started having headaches 6 weeks ago - she had pain in her temples and a crawling sensation in her head.  The headaches resolved with tylenol or sinus medication, but comes back.  She continues to have the headaches and have them every day.  The Tylenol or sinus medication still resolves some temporarily.  She states the headaches are primarily over the left temple and left eye.  She has tenderness over the brow bone and into the jaw.  She has any not on the top of her forehead that appears to be a bony growth has gotten bigger and she was unsure if it was related to that.  She denies any changes in her vision, but has had some blurriness for a while-new glasses a couple of weeks ago has not helped.  She denies any jaw pain, jaw pain with eating or neck pain.    Hypertension: one month ago we stopped the metoprolol and started losartan 25 mg daily due to losartan.  She denies any lightheadedness on the new medication.  She is taking her medication daily. She is compliant with a low sodium diet.     She does monitor her blood pressure at home - 142/69, 130/66, 109/62, 128/54, 125/61, 129/62, 133/74.    Depression: we started fluoxetine 10 mg daily one month ago, but she has not started taking it.  She states she feels better and does not feel that she needs it.  She feels depressed on occasion, but overall just feels better..      Medications and allergies reviewed with patient and updated if appropriate.  Patient Active Problem List   Diagnosis Date Noted  . Depression 12/18/2017  . SBO (small bowel obstruction) (HCC) 09/20/2017  . Aortic stenosis, moderate 08/22/2017  . CAD (coronary artery disease) 08/21/2017  . Pleural effusion on right   . Streptococcal pneumonia (HCC) 06/26/2017  . Community acquired pneumonia of right lower lobe of  lung (HCC) 06/25/2017  . Current chronic use of systemic steroids 06/25/2017  . Chronic lower back pain 05/14/2017  . Varicose veins of left lower extremity with complications 03/05/2017  . Bilateral hearing loss 02/01/2017  . Hypertension 07/31/2016  . Leg edema 07/31/2016  . Prediabetes 06/05/2016  . Osteopenia 12/01/2015  . Anxiety 07/19/2015  . Degenerative cervical disc 12/28/2014  . Left ovarian cyst   . Peripheral neuropathy 06/03/2012  . PVD (peripheral vascular disease) (HCC) 11/23/2010  . Bursitis of hip 10/26/2010  . Hyperlipidemia 06/27/2010  . IMMUNE THROMBOCYTOPENIC PURPURA 06/27/2010  . Unspecified glaucoma 06/27/2010  . GERD 06/27/2010  . Irritable bowel syndrome 06/27/2010  . OVERACTIVE BLADDER 06/27/2010  . OSTEOARTHRITIS, KNEE, RIGHT 06/27/2010  . URINARY INCONTINENCE 06/27/2010    Current Outpatient Medications on File Prior to Visit  Medication Sig Dispense Refill  . acetaminophen (TYLENOL) 325 MG tablet Take 650 mg by mouth every 6 (six) hours as needed for fever.    . Biotin 2500 MCG CAPS Take 1 capsule by mouth daily.     . Calcium-Magnesium-Vitamin D (CITRACAL CALCIUM+D) 600-40-500 MG-MG-UNIT TB24 Take 1 tablet by mouth daily.     . feeding supplement, ENSURE ENLIVE, (ENSURE ENLIVE) LIQD Take 237 mLs by mouth 2 (two) times daily between meals. 237 mL 12  . Flaxseed MISC Take 5 mLs by mouth  daily. Take 1 tsp daily    . FLUoxetine (PROZAC) 10 MG capsule Take 1 capsule (10 mg total) by mouth daily. 30 capsule 5  . furosemide (LASIX) 20 MG tablet Take 1 tablet (20 mg total) by mouth daily. 90 tablet 3  . latanoprost (XALATAN) 0.005 % ophthalmic solution Place 1 drop into both eyes at bedtime.    . lidocaine (LIDODERM) 5 % Place 2 patches onto the skin daily. Remove & Discard patch within 12 hours or as directed by MD 1 patch 0  . losartan (COZAAR) 25 MG tablet Take 1 tablet (25 mg total) by mouth daily. 30 tablet 5  . Multiple Vitamin (MULTIVITAMIN) tablet  Take 1 tablet by mouth daily.      . ondansetron (ZOFRAN ODT) 4 MG disintegrating tablet Take 1 tablet (4 mg total) by mouth every 8 (eight) hours as needed for nausea or vomiting. 20 tablet 0  . pantoprazole (PROTONIX) 20 MG tablet Take 20 mg by mouth daily.    . polyethylene glycol (MIRALAX / GLYCOLAX) packet Take 17 g by mouth daily as needed for moderate constipation.     . predniSONE (DELTASONE) 2.5 MG tablet Take 1-2 tablets (2.5-5 mg total) by mouth every other day. Take 2 tablets (5mg ) on 09/23/2017, then 1 tablet (2.5mg ) on 09/24/2017, then back to home regimen of every other day. (Patient taking differently: Take 2.5 mg by mouth every other day. Take 1 tablet every other day.) 30 tablet 0  . Probiotic Product (PROBIOTIC FORMULA PO) Take 1 tablet by mouth daily.      [name] triamcinolone cream (KENALOG) 0.1 % Apply 1 application topically as needed.   1   No current facility-administered medications on file prior to visit.     Past Medical History:  Diagnosis Date  . Anxiety   . CAD (coronary artery disease)    RCA 40% stenosis cath 01/2011  . Cholelithiasis    s/p lap chole 09/2014  . Diverticular stricture [facility]) 2006   [facility]  . DIVERTICULITIS, HX OF   . Diverticulosis   . DYSLIPIDEMIA   . Elevated LFTs   . GERD   . Glaucoma   . Hepatic steatosis   . HOH (hard of hearing)   . Immune thrombocytopenic purpura (HCC)    chronic - baseline 80-100K, on pred  . Irritable bowel syndrome   . Left ovarian cyst dx 01/2013 CT   working with gyn, ?malignant - elevated tumor marker OVA1  . OSTEOARTHRITIS, KNEE, RIGHT   . OSTEOPENIA   . OVERACTIVE BLADDER   . Prolapse of female bladder, acquired 11/2017  . UNSPECIFIED PERIPHERAL VASCULAR DISEASE   . URINARY INCONTINENCE     Past Surgical History:  Procedure Laterality Date  . ABDOMINAL HYSTERECTOMY  1963  . ANGIOPLASTY    . APPENDECTOMY  1956  . CARDIAC CATHETERIZATION    . CATARACT EXTRACTION, BILATERAL   10/2010  . CHOLECYSTECTOMY N/A 09/16/2014   Procedure: LAPAROSCOPIC CHOLECYSTECTOMY ;  Surgeon: Emelia Loron, MD;  Location: [facility];  Service: General;  Laterality: N/A;  . KNEE ARTHROSCOPY Right   . L pop PTA  10/2009   stent  . LAPAROSCOPIC SIGMOID COLECTOMY  10/2005  . SPLENECTOMY  1954  . VARICOSE VEIN SURGERY Right 1962    Social History   Socioeconomic History  . Marital status: Widowed    Spouse name: Not on file  . Number of children: 2  . Years of education: Not on file  .  Highest education level: Not on file  Occupational History  . Occupation: Retired    Comment: Programmer, multimedia  Social Needs  . Financial resource strain: Not hard at all  . Food insecurity:    Worry: Never true    Inability: Never true  . Transportation needs:    Medical: No    Non-medical: No  Tobacco Use  . Smoking status: Never Smoker  . Smokeless tobacco: Never Used  Substance and Sexual Activity  . Alcohol use: No    Alcohol/week: 0.0 standard drinks    Comment: rarely  . Drug use: No  . Sexual activity: Not Currently    Comment: 1st intercourse- 19, partners- 2,  widow  Lifestyle  . Physical activity:    Days per week: 0 days    Minutes per session: 0 min  . Stress: Only a little  Relationships  . Social connections:    Talks on phone: More than three times a week    Gets together: More than three times a week    Attends religious service: More than 4 times per year    Active member of club or organization: Yes    Attends meetings of clubs or organizations: More than 4 times per year    Relationship status: Widowed  Other Topics Concern  . 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    [place] to [place] from [place] 05/2010 to be close to kids    Family History  Problem Relation Age of Onset  . Coronary artery disease Mother   . Heart attack Mother 10  . Hyperlipidemia Mother   . Hypertension Mother   .  Stomach cancer Father   . Hypertension Daughter   . Hyperlipidemia Daughter   . Arthritis Unknown        parent  . Transient ischemic attack Unknown        parent  . Colon cancer Neg Hx     Review of Systems  Constitutional: Negative for chills and fever.  Eyes: Negative for visual disturbance.  Respiratory: Negative for shortness of breath.   Cardiovascular: Positive for chest pain. Negative for palpitations.  Gastrointestinal: Negative for nausea.  Musculoskeletal:       No new muscle or joint pain  Neurological: Positive for headaches. Negative for light-headedness.       Objective:   Vitals:   01/28/18 1525  BP: 134/68  Pulse: 87  Resp: 16  Temp: 98 F (36.7 C)  SpO2: 98%   BP Readings from Last 3 Encounters:  01/28/18 134/68  01/24/18 (!) 141/60  12/18/17 (!) 142/76   Wt Readings from Last 3 Encounters:  01/28/18 146 lb (66.2 kg)  01/24/18 145 lb (65.8 kg)  12/18/17 144 lb (65.3 kg)   Body mass index is 26.7 kg/m.   Physical Exam    Constitutional: Appears well-developed and well-nourished. No distress.  HENT:  Head: Normocephalic and atraumatic.  Tenderness with palpation medial aspect of eyebrow and just above eyebrow on left side, no temple or temporal artery tenderness with palpation, no TMJ tenderness Neck: Neck supple. No tracheal deviation present. No thyromegaly present.  No cervical lymphadenopathy Cardiovascular: Normal rate, regular rhythm and normal heart sounds.   2/6 systolic murmur heard. No carotid bruit .  No edema Pulmonary/Chest: Effort normal and breath sounds normal. No respiratory distress. No has no wheezes. No rales.  Skin: Skin is warm and dry. Not diaphoretic.  Psychiatric: Normal mood and affect. Behavior is normal.      Assessment & Plan:    See Problem List for Assessment and Plan of chronic medical problems.

## 2018-01-28 ENCOUNTER — Encounter: Payer: Self-pay | Admitting: Internal Medicine

## 2018-01-28 ENCOUNTER — Other Ambulatory Visit (INDEPENDENT_AMBULATORY_CARE_PROVIDER_SITE_OTHER): Payer: Medicare HMO

## 2018-01-28 ENCOUNTER — Ambulatory Visit (INDEPENDENT_AMBULATORY_CARE_PROVIDER_SITE_OTHER): Payer: Medicare HMO | Admitting: Internal Medicine

## 2018-01-28 VITALS — BP 134/68 | HR 87 | Temp 98.0°F | Resp 16 | Ht 62.0 in | Wt 146.0 lb

## 2018-01-28 DIAGNOSIS — F3289 Other specified depressive episodes: Secondary | ICD-10-CM | POA: Diagnosis not present

## 2018-01-28 DIAGNOSIS — R51 Headache: Secondary | ICD-10-CM

## 2018-01-28 DIAGNOSIS — M316 Other giant cell arteritis: Secondary | ICD-10-CM | POA: Diagnosis not present

## 2018-01-28 DIAGNOSIS — R519 Headache, unspecified: Secondary | ICD-10-CM

## 2018-01-28 DIAGNOSIS — D225 Melanocytic nevi of trunk: Secondary | ICD-10-CM | POA: Diagnosis not present

## 2018-01-28 DIAGNOSIS — D169 Benign neoplasm of bone and articular cartilage, unspecified: Secondary | ICD-10-CM | POA: Diagnosis not present

## 2018-01-28 DIAGNOSIS — G8929 Other chronic pain: Secondary | ICD-10-CM

## 2018-01-28 DIAGNOSIS — L820 Inflamed seborrheic keratosis: Secondary | ICD-10-CM | POA: Diagnosis not present

## 2018-01-28 DIAGNOSIS — D1801 Hemangioma of skin and subcutaneous tissue: Secondary | ICD-10-CM | POA: Diagnosis not present

## 2018-01-28 DIAGNOSIS — I10 Essential (primary) hypertension: Secondary | ICD-10-CM | POA: Diagnosis not present

## 2018-01-28 DIAGNOSIS — L814 Other melanin hyperpigmentation: Secondary | ICD-10-CM | POA: Diagnosis not present

## 2018-01-28 DIAGNOSIS — L821 Other seborrheic keratosis: Secondary | ICD-10-CM | POA: Diagnosis not present

## 2018-01-28 DIAGNOSIS — R69 Illness, unspecified: Secondary | ICD-10-CM | POA: Diagnosis not present

## 2018-01-28 LAB — CBC WITH DIFFERENTIAL/PLATELET
BASOS ABS: 0.1 10*3/uL (ref 0.0–0.1)
BASOS PCT: 0.7 % (ref 0.0–3.0)
EOS ABS: 0.1 10*3/uL (ref 0.0–0.7)
Eosinophils Relative: 0.7 % (ref 0.0–5.0)
HEMATOCRIT: 38 % (ref 36.0–46.0)
HEMOGLOBIN: 12.8 g/dL (ref 12.0–15.0)
LYMPHS PCT: 34.5 % (ref 12.0–46.0)
Lymphs Abs: 3.6 10*3/uL (ref 0.7–4.0)
MCHC: 33.6 g/dL (ref 30.0–36.0)
MCV: 83 fl (ref 78.0–100.0)
MONO ABS: 0.7 10*3/uL (ref 0.1–1.0)
Monocytes Relative: 6.6 % (ref 3.0–12.0)
Neutro Abs: 6.1 10*3/uL (ref 1.4–7.7)
Neutrophils Relative %: 57.5 % (ref 43.0–77.0)
Platelets: 184 10*3/uL (ref 150.0–400.0)
RBC: 4.58 Mil/uL (ref 3.87–5.11)
RDW: 13.8 % (ref 11.5–15.5)
WBC: 10.6 10*3/uL — AB (ref 4.0–10.5)

## 2018-01-28 LAB — COMPREHENSIVE METABOLIC PANEL
ALBUMIN: 4.2 g/dL (ref 3.5–5.2)
ALK PHOS: 73 U/L (ref 39–117)
ALT: 10 U/L (ref 0–35)
AST: 15 U/L (ref 0–37)
BILIRUBIN TOTAL: 0.3 mg/dL (ref 0.2–1.2)
BUN: 23 mg/dL (ref 6–23)
CALCIUM: 9.4 mg/dL (ref 8.4–10.5)
CO2: 30 mEq/L (ref 19–32)
CREATININE: 0.92 mg/dL (ref 0.40–1.20)
Chloride: 104 mEq/L (ref 96–112)
GFR: 61.46 mL/min (ref >60.00)
Glucose, Bld: 152 mg/dL — ABNORMAL HIGH (ref 70–99)
Potassium: 4.2 mEq/L (ref 3.5–5.1)
SODIUM: 140 meq/L (ref 135–145)
TOTAL PROTEIN: 7.4 g/dL (ref 6.0–8.3)

## 2018-01-28 LAB — C-REACTIVE PROTEIN: CRP: 0.9 mg/dL (ref 0.5–20.0)

## 2018-01-28 LAB — SEDIMENTATION RATE: SED RATE: 25 mm/h (ref 0–30)

## 2018-01-28 NOTE — Patient Instructions (Addendum)
  Tests ordered today. Your results will be released to MyChart (or called to you) after review, usually within 72hours after test completion. If any changes need to be made, you will be notified at that same time.  Medications reviewed and updated.  Changes include :   none      Please followup in 6 months   

## 2018-01-28 NOTE — Assessment & Plan Note (Signed)
Blood pressure well controlled on losartan 25 mg daily and she feels much better off of metoprolol Continue current medication at current dose She will continue to monitor her blood pressure at home

## 2018-01-28 NOTE — Assessment & Plan Note (Signed)
He did not take the fluoxetine and does not think she needs it She may have been experiencing some low-grade depression from the metoprolol because once we stopped that she seemed to start feeling better We will monitor off medication

## 2018-01-28 NOTE — Assessment & Plan Note (Signed)
Having daily headaches left temple and over left head, with tenderness over eye.  No temporal artery tenderness or jaw pain on exam Unlikely temporal arteritis, but will check CRP, ESR, CBC and CMP May have nerve pain from the nerve exiting the skull near the medial aspect of her eyebrow May need to see neurology

## 2018-02-03 ENCOUNTER — Other Ambulatory Visit: Payer: Self-pay | Admitting: Internal Medicine

## 2018-02-04 ENCOUNTER — Ambulatory Visit: Payer: Medicare HMO | Admitting: Internal Medicine

## 2018-02-13 ENCOUNTER — Telehealth: Payer: Self-pay

## 2018-02-13 ENCOUNTER — Encounter: Payer: Self-pay | Admitting: Internal Medicine

## 2018-02-13 DIAGNOSIS — R519 Headache, unspecified: Secondary | ICD-10-CM

## 2018-02-13 DIAGNOSIS — R51 Headache: Principal | ICD-10-CM

## 2018-02-13 NOTE — Telephone Encounter (Signed)
Can you put a new referral in for neurology? I will send it over there. thanks

## 2018-02-13 NOTE — Telephone Encounter (Signed)
Error

## 2018-02-17 NOTE — Progress Notes (Signed)
Tamara Robertson was seen today in neurologic consultation at the request of Burns, Claudina Lick, MD.  The consultation is for the evaluation of veering the right when ambulating.  Pt states that sx's began about 3 months ago.  She states that she feels like she is a "car out of alignment" and always "has to correct."  She also c/o that her balance is not good but there is no falls.  It also has been going on for 3 months or so and she feels that the 2 issues are related.  No paresthesias of the feet.  No DM.  She does note that her husband died in April 18, 2023.  That same day she had a big fall when she was trying to answer the door.  She caught her shoe on the floor when trying to turn and hit the head on the door jam. The patient did have a CT of the brain after that fall on 03/10/2015.  I had the opportunity to review this.  It was unremarkable with the exception of a left parietal scalp hematoma.  She denies new medications.  She has xanax listed as a medication but she rarely takes it.  She states that she tried a 1/2 tablet the other day to see if it would help and she "slept the entire day."   Also c/o paresthesias in the scalp and a lightheaded feeling.  Occasionally, and only momentarily she will have deja vu feeling and then it is gone.     02/18/18 update: Patient is seen today for headache.  I have never seen her before for that.  In fact, I have not seen her in 2-1/2 years, at which point she was complaining about balance change.  Headaches have been going on since August.  She did not previous consider herself to be a headache type of person.  Headaches are located above the left eye and she can sometimes initiate it by pushing on it.  It can radiate to anywhere in the head or face. She describes it as a dull ache.  She has photophobia.  She has no phonophobia.  She has had no associated nausea and vomiting.  She is taking tylenol occasionally.  Headache lasts a few hours.  touching the face will initiate  it (washing face).  She saw her primary care physician.  Sed rate was checked and was 25.  CRP was normal at 0.9.  Patient did have a CT of the brain in February, 2019.  This was unremarkable for acute change.  There was mild atrophy.  ALLERGIES:   Allergies  Allergen Reactions  . Aspirin Other (See Comments)    ITP  . Sulfa Antibiotics Other (See Comments)    dizziness  . Contrast Media [Iodinated Diagnostic Agents] Hives  . Statins Other (See Comments)    CURRENT MEDICATIONS:  Outpatient Encounter Medications as of 02/18/2018  Medication Sig  . acetaminophen (TYLENOL) 325 MG tablet Take 650 mg by mouth every 6 (six) hours as needed for fever.  . Biotin 2500 MCG CAPS Take 1 capsule by mouth daily.   . Calcium-Magnesium-Vitamin D (CITRACAL CALCIUM+D) 600-40-500 MG-MG-UNIT TB24 Take 1 tablet by mouth daily.   . feeding supplement, ENSURE ENLIVE, (ENSURE ENLIVE) LIQD Take 237 mLs by mouth 2 (two) times daily between meals.  . Flaxseed MISC Take 5 mLs by mouth daily. Take 1 tsp daily  . FLUoxetine (PROZAC) 10 MG tablet Take 10 mg by mouth daily.  . furosemide (LASIX) 20  MG tablet Take 1 tablet (20 mg total) by mouth daily.  [name] latanoprost (XALATAN) 0.005 % ophthalmic solution Place 1 drop into both eyes at bedtime.  . lidocaine (LIDODERM) 5 % Place 2 patches onto the skin daily. Remove & Discard patch within 12 hours or as directed by MD  . losartan (COZAAR) 25 MG tablet Take 1 tablet (25 mg total) by mouth daily.  . Multiple Vitamin (MULTIVITAMIN) tablet Take 1 tablet by mouth daily.    . ondansetron (ZOFRAN ODT) 4 MG disintegrating tablet Take 1 tablet (4 mg total) by mouth every 8 (eight) hours as needed for nausea or vomiting.  . pantoprazole (PROTONIX) 20 MG tablet Take 20 mg by mouth daily.  . polyethylene glycol (MIRALAX / GLYCOLAX) packet Take 17 g by mouth daily as needed for moderate constipation.   . predniSONE (DELTASONE) 2.5 MG tablet Take 1-2 tablets (2.5-5 mg total) by mouth  every other day. Take 2 tablets (5mg ) on 09/23/2017, then 1 tablet (2.5mg ) on 09/24/2017, then back to home regimen of every other day. (Patient taking differently: Take 2.5 mg by mouth every other day. )  . triamcinolone cream (KENALOG) 0.1 % Apply 1 application topically as needed.   . gabapentin (NEURONTIN) 100 MG capsule Take 1 capsule (100 mg total) by mouth 3 (three) times daily.  . [DISCONTINUED] pantoprazole (PROTONIX) 20 MG tablet TAKE 1 TABLET BY MOUTH ONCE DAILY  . [DISCONTINUED] Probiotic Product (PROBIOTIC FORMULA PO) Take 1 tablet by mouth daily.     No facility-administered encounter medications on file as of 02/18/2018.     PAST MEDICAL HISTORY:   Past Medical History:  Diagnosis Date  . Anxiety   . CAD (coronary artery disease)    RCA 40% stenosis cath 01/2011  . Cholelithiasis    s/p lap chole 09/2014  . Diverticular stricture [facility]) 2006   [facility]  . DIVERTICULITIS, HX OF   . Diverticulosis   . DYSLIPIDEMIA   . Elevated LFTs   . GERD   . Glaucoma   . Hepatic steatosis   . HOH (hard of hearing)   . Immune thrombocytopenic purpura (HCC)    chronic - baseline 80-100K, on pred  . Irritable bowel syndrome   . Left ovarian cyst dx 01/2013 CT   working with gyn, ?malignant - elevated tumor marker OVA1  . OSTEOARTHRITIS, KNEE, RIGHT   . OSTEOPENIA   . OVERACTIVE BLADDER   . Prolapse of female bladder, acquired 11/2017  . UNSPECIFIED PERIPHERAL VASCULAR DISEASE   . URINARY INCONTINENCE     PAST SURGICAL HISTORY:   Past Surgical History:  Procedure Laterality Date  . ABDOMINAL HYSTERECTOMY  1963  . ANGIOPLASTY    . APPENDECTOMY  1956  . CARDIAC CATHETERIZATION    . CATARACT EXTRACTION, BILATERAL  10/2010  . CHOLECYSTECTOMY N/A 09/16/2014   Procedure: LAPAROSCOPIC CHOLECYSTECTOMY ;  Surgeon: Rolm Bookbinder, MD;  Location: [facility];  Service: General;  Laterality: N/A;  . KNEE ARTHROSCOPY Right   . L pop PTA  10/2009   stent  . LAPAROSCOPIC  SIGMOID COLECTOMY  10/2005  . SPLENECTOMY  1954  . VARICOSE VEIN SURGERY Right 1962    SOCIAL HISTORY:   Social History   Socioeconomic History  . Marital status: Widowed    Spouse name: Not on file  . Number of children: 2  . Years of education: Not on file  . Highest education level: Not on file  Occupational History  . Occupation: Retired  Comment: front desk dental office  Social Needs  . Financial resource strain: Not hard at all  . Food insecurity:    Worry: Never true    Inability: Never true  . Transportation needs:    Medical: No    Non-medical: No  Tobacco Use  . Smoking status: Never Smoker  . Smokeless tobacco: Never Used  Substance and Sexual Activity  . Alcohol use: No    Alcohol/week: 0.0 standard drinks    Comment: rarely  . Drug use: No  . Sexual activity: Not Currently    Comment: 1st intercourse- 19, partners- 2,  widow  Lifestyle  . Physical activity:    Days per week: 0 days    Minutes per session: 0 min  . Stress: Only a little  Relationships  . Social connections:    Talks on phone: More than three times a week    Gets together: More than three times a week    Attends religious service: More than 4 times per year    Active member of club or organization: Yes    Attends meetings of clubs or organizations: More than 4 times per year    Relationship status: Widowed  . Intimate partner violence:    Fear of current or ex partner: Not on file    Emotionally abused: Not on file    Physically abused: Not on file    Forced sexual activity: Not on file  Other Topics Concern  . 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    Dorie Rank to [place] from [place] 05/2010 to be close to kids    FAMILY HISTORY:   Family Status  Relation Name Status  . Mother  Deceased at age 48       TIA/ MI  . Father  Deceased at age 45       stomach cancer  . Child  Deceased at age 66       1, MVA  . Child   Alive       2, healthy  . Daughter  (Not Specified)  . Unknown  (Not Specified)  . Unknown  (Not Specified)  . MGM  Deceased  . MGF  Deceased  . PGM  Deceased  . PGF  Deceased  . Neg Hx  (Not Specified)    ROS:  Review of Systems  Constitutional: Negative.   HENT: Negative.   Eyes: Negative.   Respiratory: Negative.   Cardiovascular: Negative.   Musculoskeletal: Negative.   Skin: Negative.   Neurological: Positive for headaches.  Endo/Heme/Allergies: Negative.      PHYSICAL EXAMINATION:    VITALS:   Vitals:   02/18/18 1405  BP: (!) 142/66  Pulse: 82  SpO2: 98%  Weight: 146 lb (66.2 kg)  Height: 5\' 2"  (1.575 m)     Gen:  Appears stated age and in NAD. HEENT:  Normocephalic, atraumatic. The mucous membranes are moist. The superficial temporal arteries are without ropiness or tenderness.   Cardiovascular: Regular rate and rhythm. Lungs: Clear to auscultation bilaterally. Neck: There are no carotid bruits noted bilaterally.  NEUROLOGICAL:  Orientation:  The patient is alert and oriented x 3.   Cranial nerves: There is good facial symmetry. The pupils are pinpoint round and just slightly reactive to light bilaterally. Fundoscopic exam is attempted but the disc margins are not well visualized bilaterally.  Extraocular muscles are intact and visual fields are full to confrontational testing.  Speech is fluent and clear. Soft palate rises symmetrically and there is no tongue deviation. Hearing is intact to conversational tone. Tone: Tone is good throughout. Sensation: Sensation is intact to light touch x 4 Coordination:  The patient has no difficulty with RAM's or FNF bilaterally. Motor: Strength is 5/5 in the bilateral upper and lower extremities.  Shoulder shrug is equal bilaterally.  There is no pronator drift.  There are no fasciculations noted. DTR's: Deep tendon reflexes are 2/4 at the bilateral biceps, triceps, brachioradialis, patella and achilles.  Plantar  responses are downgoing bilaterally. Gait and Station: The patient is able to ambulate without difficulty.     LABS    Chemistry      Component Value Date/Time   NA 140 01/28/2018 1607   NA 142 02/16/2016 1526   K 4.2 01/28/2018 1607   K 4.3 02/16/2016 1526   CL 104 01/28/2018 1607   CL 105 04/17/2012 1452   CO2 30 01/28/2018 1607   CO2 27 02/16/2016 1526   BUN 23 01/28/2018 1607   BUN 14.3 02/16/2016 1526   CREATININE 0.92 01/28/2018 1607   CREATININE 0.8 02/16/2016 1526      Component Value Date/Time   CALCIUM 9.4 01/28/2018 1607   CALCIUM 9.5 02/16/2016 1526   ALKPHOS 73 01/28/2018 1607   ALKPHOS 92 02/16/2016 1526   AST 15 01/28/2018 1607   AST 19 02/16/2016 1526   ALT 10 01/28/2018 1607   ALT 11 02/16/2016 1526   BILITOT 0.3 01/28/2018 1607   BILITOT 0.33 02/16/2016 1526     Lab Results  Component Value Date   TSH 2.05 07/23/2017   Lab Results  Component Value Date   VITAMINB12 421 07/31/2016     Lab Results  Component Value Date   WBC 10.6 (H) 01/28/2018   HGB 12.8 01/28/2018   HCT 38.0 01/28/2018   MCV 83.0 01/28/2018   PLT 184.0 01/28/2018      IMPRESSION/PLAN  1.  New onset daily persistent headache   -some of features of headache sound similar to TN, but hers involve both sides of face.  In addition, symptoms rotate/radiate fast.  She pushed on L frontal region at beginning of the visit and said it initiated head pain but by the end of the visit, stated that it was the vertex that hurt  -We will do an MRI of the brain.  -talked about gabapentin and will start with 100 mg tid.  If it doesn't help will try trileptal or even VPA.  She is to call me if any problems  2.  F/u 8 weeks.  Much greater than 50% of this visit was spent in counseling and coordinating care.  Total face to face time:  25 min

## 2018-02-18 ENCOUNTER — Other Ambulatory Visit: Payer: Self-pay | Admitting: Internal Medicine

## 2018-02-18 ENCOUNTER — Encounter: Payer: Self-pay | Admitting: Neurology

## 2018-02-18 ENCOUNTER — Ambulatory Visit: Payer: Medicare HMO | Admitting: Neurology

## 2018-02-18 VITALS — BP 142/66 | HR 82 | Ht 62.0 in | Wt 146.0 lb

## 2018-02-18 DIAGNOSIS — G4452 New daily persistent headache (NDPH): Secondary | ICD-10-CM

## 2018-02-18 MED ORDER — GABAPENTIN 100 MG PO CAPS: 100.0000 mg | ORAL_CAPSULE | Freq: Three times a day (TID) | ORAL | 1 refills | Status: DC

## 2018-02-18 NOTE — Patient Instructions (Addendum)
Start gabapentin - 100mg  - 1 capsule twice a day for a week and then increase to 100 mg three times per day.    We will schedule you for an MRI brain.  They will call you to schedule this.  Call me if you don't hear from Tamara Robertson in the next week or so to schedule

## 2018-02-19 NOTE — Telephone Encounter (Signed)
Please advise 

## 2018-02-21 ENCOUNTER — Ambulatory Visit: Payer: Medicare HMO | Admitting: Neurology

## 2018-02-24 NOTE — Progress Notes (Signed)
Cardiology Office Note   Date:  02/25/2018   ID:  Tamara Robertson, DOB [birth year], MRN [id number]   PCP:  Tamara Sanes, MD  Cardiologist:   No primary care provider on file.   Chief Complaint  Patient presents with  . Coronary Artery Disease      History of Present Illness: Tamara Robertson is a 84 y.o. female who presents for evaluation of PT.  She has had non obstructive CAD.    With remote PTA in [place], and chronic LE edema.   She was in the hospital earlier this year with sepsis, respiratory failure and AKI.  She returns for follow up.   She is been seen by neurology for headaches and is getting an MRI soon.  Of note in May she had a small bowel obstruction that resolved spontaneously.  She stays active.  She is not having any acute complaints.  She denies any chest pressure, neck or arm discomfort.  Said no palpitations, presyncope or syncope.  She denies any new shortness of breath, PND or orthopnea.  She volunteers it was a long hospital still   Past Medical History:  Diagnosis Date  . Anxiety   . CAD (coronary artery disease)    RCA 40% stenosis cath 01/2011  . Cholelithiasis    s/p lap chole 09/2014  . Diverticular stricture [facility]) 2006   [facility]  . DIVERTICULITIS, HX OF   . Diverticulosis   . DYSLIPIDEMIA   . Elevated LFTs   . GERD   . Glaucoma   . Hepatic steatosis   . HOH (hard of hearing)   . Immune thrombocytopenic purpura (HCC)    chronic - baseline 80-100K, on pred  . Irritable bowel syndrome   . Left ovarian cyst dx 01/2013 CT   working with gyn, ?malignant - elevated tumor marker OVA1  . OSTEOARTHRITIS, KNEE, RIGHT   . OSTEOPENIA   . OVERACTIVE BLADDER   . Prolapse of female bladder, acquired 11/2017  . UNSPECIFIED PERIPHERAL VASCULAR DISEASE   . URINARY INCONTINENCE     Past Surgical History:  Procedure Laterality Date  . ABDOMINAL HYSTERECTOMY  1963  . ANGIOPLASTY    . APPENDECTOMY  1956  . CARDIAC CATHETERIZATION     . CATARACT EXTRACTION, BILATERAL  10/2010  . CHOLECYSTECTOMY N/A 09/16/2014   Procedure: LAPAROSCOPIC CHOLECYSTECTOMY ;  Surgeon: Emelia Loron, MD;  Location: [facility] OR;  Service: General;  Laterality: N/A;  . KNEE ARTHROSCOPY Right   . L pop PTA  10/2009   stent  . LAPAROSCOPIC SIGMOID COLECTOMY  10/2005  . SPLENECTOMY  1954  . VARICOSE VEIN SURGERY Right 1962     Current Outpatient Medications  Medication Sig Dispense Refill  . Biotin 2500 MCG CAPS Take 1 capsule by mouth daily.     . Calcium-Magnesium-Vitamin D (CITRACAL CALCIUM+D) 600-40-500 MG-MG-UNIT TB24 Take 1 tablet by mouth daily.     . Flaxseed MISC Take 5 mLs by mouth daily. Take 1 tsp daily    . FLUoxetine (PROZAC) 10 MG tablet Take 10 mg by mouth daily.    . furosemide (LASIX) 20 MG tablet Take 1 tablet (20 mg total) by mouth daily. 90 tablet 3  . gabapentin (NEURONTIN) 100 MG capsule Take 1 capsule (100 mg total) by mouth 3 (three) times daily. 270 capsule 1  . latanoprost (XALATAN) 0.005 % ophthalmic solution Place 1 drop into both eyes at bedtime.    [name] losartan (COZAAR) 25 MG  tablet Take 1 tablet (25 mg total) by mouth daily. 30 tablet 5  . Multiple Vitamin (MULTIVITAMIN) tablet Take 1 tablet by mouth daily.      . pantoprazole (PROTONIX) 20 MG tablet Take 20 mg by mouth daily.    . polyethylene glycol (MIRALAX / GLYCOLAX) packet Take 17 g by mouth daily as needed for moderate constipation.     . predniSONE (DELTASONE) 2.5 MG tablet Take 1-2 tablets (2.5-5 mg total) by mouth every other day. Take 2 tablets (5mg ) on 09/23/2017, then 1 tablet (2.5mg ) on 09/24/2017, then back to home regimen of every other day. 30 tablet 0  . triamcinolone cream (KENALOG) 0.1 % Apply 1 application topically as needed.   1   No current facility-administered medications for this visit.     Allergies:   Aspirin; Sulfa antibiotics; Contrast media [iodinated diagnostic agents]; and Statins    ROS:  Please see the history of present illness.    Otherwise, review of systems are positive for none.   All other systems are reviewed and negative.    PHYSICAL EXAM: VS:  BP 140/68 (BP Location: Right Arm, Patient Position: Sitting, Cuff Size: Normal)   Pulse 89   Ht 5\' 2"  (1.575 m)   Wt 148 lb (67.1 kg)   SpO2 97%   BMI 27.07 kg/m  , BMI Body mass index is 27.07 kg/m. GENERAL:  Well appearing NECK:  No jugular venous distention, waveform within normal limits, carotid upstroke brisk and symmetric, no bruits, no thyromegaly LUNGS:  Clear to auscultation bilaterally CHEST:  Unremarkable HEART:  PMI not displaced or sustained,S1 and S2 within normal limits, no S3, no S4, no clicks, no rubs, 3 out of 6 apical early peaking systolic murmur radiating slightly at the aortic outflow tract, no diastolic murmurs ABD:  Flat, positive bowel sounds normal in frequency in pitch, no bruits, no rebound, no guarding, no midline pulsatile mass, no hepatomegaly, no splenomegaly EXT:  2 plus pulses throughout, no edema, no cyanosis no clubbing    EKG:  EKG is not ordered today.   Recent Labs: 07/23/2017: TSH 2.05 09/20/2017: Magnesium 2.2 01/28/2018: ALT 10; BUN 23; Creatinine, Ser 0.92; Hemoglobin 12.8; Platelets 184.0; Potassium 4.2; Sodium 140    Lipid Panel    Component Value Date/Time   CHOL 196 07/04/2017 1150   CHOL 215 (H) 06/22/2014 0825   TRIG 193 (H) 06/30/2017 0708   TRIG 161 (H) 06/22/2014 0825   TRIG 228 02/20/2010   HDL 58.40 05/23/2017 1151   HDL 51 06/22/2014 0825   CHOLHDL 3 05/23/2017 1151   VLDL 23.0 05/23/2017 1151   LDLCALC 107 (H) 05/23/2017 1151   LDLCALC 132 (H) 06/22/2014 0825   LDLDIRECT 155.9 03/19/2014 0828      Wt Readings from Last 3 Encounters:  02/25/18 148 lb (67.1 kg)  02/18/18 146 lb (66.2 kg)  01/28/18 146 lb (66.2 kg)      Other studies Reviewed: Additional studies/ records that were reviewed today include: Hospital records, neuro notes. Review of the above records demonstrates:  Please see  elsewhere in the note.     ASSESSMENT AND PLAN:   CAD (coronary artery disease) Minor CAD at cath 2012- (40% RCA).  No change in therapy.     Aortic stenosis, moderate Normal LVF March 2019.  Follow clinically.   Hypertension The blood pressure is at target. No change in medications is indicated. We will continue with therapeutic lifestyle changes (TLC).  PVD (peripheral vascular disease) (HCC) H/O  LEA stent [place] in 2011.   She has reasonable pulses and no pain.    Varicose veins of left lower extremity with complications No change in therapy.    Current medicines are reviewed at length with the patient today.  The patient does not have concerns regarding medicines.  The following changes have been made:  no change  Labs/ tests ordered today include: None No orders of the defined types were placed in this encounter.    Disposition:   FU with me in one year.     Signed, Rollene Rotunda, MD  02/25/2018 2:34 PM    [facility]

## 2018-02-25 ENCOUNTER — Encounter: Payer: Self-pay | Admitting: Cardiology

## 2018-02-25 ENCOUNTER — Ambulatory Visit: Payer: Medicare HMO | Admitting: Cardiology

## 2018-02-25 VITALS — BP 140/68 | HR 89 | Ht 62.0 in | Wt 148.0 lb

## 2018-02-25 DIAGNOSIS — I10 Essential (primary) hypertension: Secondary | ICD-10-CM

## 2018-02-25 DIAGNOSIS — I739 Peripheral vascular disease, unspecified: Secondary | ICD-10-CM

## 2018-02-25 DIAGNOSIS — I350 Nonrheumatic aortic (valve) stenosis: Secondary | ICD-10-CM | POA: Diagnosis not present

## 2018-02-25 NOTE — Patient Instructions (Signed)
Medication Instructions:  Continue current medications  If you need a refill on your cardiac medications before your next appointment, please call your pharmacy.  Labwork: None Ordered   If you have labs (blood work) drawn today and your tests are completely normal, you will receive your results only by: . MyChart Message (if you have MyChart) OR . A paper copy in the mail If you have any lab test that is abnormal or we need to change your treatment, we will call you to review the results.  Testing/Procedures: None Ordered  Follow-Up: You will need a follow up appointment in 1 Year.  Please call our office 2 months in advance([phone]) to schedule the appointment.  You may see  DR Hochrein or one of the following Advanced Practice Providers on your designated Care Team:   . Kathryn Lawrence, DNP, ANP . Rhonda Barrett, PA-C .  . Luke Kilroy, PA-C . Krista Kroeger, PA-C . Callie Goodrich, PA-C .  . Hao Meng, PA-C . Angela Duke, PA-C  At [facility], you and your health needs are our priority.  As part of our continuing mission to provide you with exceptional heart care, we have created designated Provider Care Teams.  These Care Teams include your primary Cardiologist (physician) and Advanced Practice Providers (APPs -  Physician Assistants and Nurse Practitioners) who all work together to provide you with the care you need, when you need it.   Thank you for choosing [facility] at [place]!!       

## 2018-02-26 ENCOUNTER — Ambulatory Visit: Payer: Medicare HMO | Admitting: Cardiology

## 2018-03-04 ENCOUNTER — Encounter: Payer: Self-pay | Admitting: Obstetrics & Gynecology

## 2018-03-04 ENCOUNTER — Ambulatory Visit: Payer: Medicare HMO | Admitting: Obstetrics & Gynecology

## 2018-03-04 VITALS — BP 136/70 | Ht 61.0 in | Wt 147.0 lb

## 2018-03-04 DIAGNOSIS — N8110 Cystocele, unspecified: Secondary | ICD-10-CM

## 2018-03-04 DIAGNOSIS — M8589 Other specified disorders of bone density and structure, multiple sites: Secondary | ICD-10-CM

## 2018-03-04 DIAGNOSIS — Z780 Asymptomatic menopausal state: Secondary | ICD-10-CM

## 2018-03-04 DIAGNOSIS — N83202 Unspecified ovarian cyst, left side: Secondary | ICD-10-CM

## 2018-03-04 DIAGNOSIS — Z01419 Encounter for gynecological examination (general) (routine) without abnormal findings: Secondary | ICD-10-CM | POA: Diagnosis not present

## 2018-03-04 NOTE — Progress Notes (Signed)
Tamara Robertson [birth year] [id number]   History:    84 y.o. G3P3L2  RP:  Established patient presenting for annual gyn exam   HPI: Status post total hysterectomy.  Postmenopausal on no hormone replacement therapy.  No pelvic pain.  Cystocele with minimal symptoms unchanged since visit in July 2019.  Bowel movements normal.  Breasts normal.  Body mass index 27.78.  Active physically.  Health labs with family physician.  Past medical history,surgical history, family history and social history were all reviewed and documented in the EPIC chart.  Gynecologic History No LMP recorded. Patient has had a hysterectomy. Contraception: status post hysterectomy Last Pap: 2016. Results were: Normal Last mammogram: 11/2017. Results were: Negative Bone Density: 08/2016 Osteopenia Colonoscopy: 2006.  Not doing Colonoscopy anymore.  Obstetric History OB History  Gravida Para Term Preterm AB Living  3 3       2   SAB TAB Ectopic Multiple Live Births               # Outcome Date GA Lbr Len/2nd Weight Sex Delivery Anes PTL Lv  3 Para           2 Para           1 Para             Obstetric Comments  1 child passed away at 20     ROS: A ROS was performed and pertinent positives and negatives are included in the history.  GENERAL: No fevers or chills. HEENT: No change in vision, no earache, sore throat or sinus congestion. NECK: No pain or stiffness. CARDIOVASCULAR: No chest pain or pressure. No palpitations. PULMONARY: No shortness of breath, cough or wheeze. GASTROINTESTINAL: No abdominal pain, nausea, vomiting or diarrhea, melena or bright red blood per rectum. GENITOURINARY: No urinary frequency, urgency, hesitancy or dysuria. MUSCULOSKELETAL: No joint or muscle pain, no back pain, no recent trauma. DERMATOLOGIC: No rash, no itching, no lesions. ENDOCRINE: No polyuria, polydipsia, no heat or cold intolerance. No recent change in weight. HEMATOLOGICAL: No anemia or easy bruising or bleeding.  NEUROLOGIC: No headache, seizures, numbness, tingling or weakness. PSYCHIATRIC: No depression, no loss of interest in normal activity or change in sleep pattern.     Exam:   BP 136/70   Ht 5\' 1"  (1.549 m)   Wt 147 lb (66.7 kg)   BMI 27.78 kg/m   Body mass index is 27.78 kg/m.  General appearance : Well developed well nourished female. No acute distress HEENT: Eyes: no retinal hemorrhage or exudates,  Neck supple, trachea midline, no carotid bruits, no thyroidmegaly Lungs: Clear to auscultation, no rhonchi or wheezes, or rib retractions  Heart: Regular rate and rhythm, no murmurs or gallops Breast:Examined in sitting and supine position were symmetrical in appearance, no palpable masses or tenderness,  no skin retraction, no nipple inversion, no nipple discharge, no skin discoloration, no axillary or supraclavicular lymphadenopathy Abdomen: no palpable masses or tenderness, no rebound or guarding Extremities: no edema or skin discoloration or tenderness  Pelvic: Vulva: Normal             Vagina: No gross lesions or discharge  Cervix/Uterus absent  Adnexa  Without masses or tenderness  Anus: Normal.  Rectal exam negative  Pelvic US 01/22/2018:  4.1 x 2.9 by 3.6 cm cm indeterminate but probably benign cystic lesion in the left adnexa, which shows gradual increase in size compared with previous studies in 2016 and 2015. A benign or low malignant potential  cystic ovarian neoplasm cannot be excluded.  CA 125 was normal at 13, however, OVA-1 was slightly elevated at 4.7 with the normal reference range of less than 4.4 and a postmenopausal woman.    Seen by Dr Tawnya Crook [facility] 01/24/2018.  F/U Pelvic US in 1 year.   Assessment/Plan:  84 y.o. female for annual exam   1. Well female exam with routine gynecological exam Gynecologic exam status post total hysterectomy in menopause.  No indication to repeat Pap tests at this point.  Breast exam normal.  Last mammogram negative July  2019.  Not doing colonoscopies anymore.  Health labs with family physician.  2. Postmenopausal Postmenopausal without symptoms.  3. Osteopenia of multiple sites Bone density April 2018 showed osteopenia at multiple sites.  Vitamin D supplements, calcium intake of 1.5 g/day and regular weightbearing physical activity recommended.  4. Baden-Walker grade 2 cystocele Stable cystocele with minimal symptoms.  Continue to observe.  5. Left ovarian cyst Left ovarian cyst followed by gynecology.  Will repeat a pelvic ultrasound next year.  Counseling on above issues and coordination of care more than 50% for 15 minutes.  [name] MD, 2:10 PM 03/04/2018

## 2018-03-05 ENCOUNTER — Ambulatory Visit
Admission: RE | Admit: 2018-03-05 | Discharge: 2018-03-05 | Disposition: A | Discharge status: Home / Self Care | Payer: Medicare HMO | Source: Ambulatory Visit | Attending: Neurology | Admitting: Neurology

## 2018-03-05 DIAGNOSIS — R51 Headache: Secondary | ICD-10-CM | POA: Diagnosis not present

## 2018-03-05 DIAGNOSIS — G4452 New daily persistent headache (NDPH): Secondary | ICD-10-CM

## 2018-03-06 ENCOUNTER — Telehealth: Payer: Self-pay | Admitting: Neurology

## 2018-03-06 NOTE — Telephone Encounter (Signed)
Mychart message sent to patient.

## 2018-03-06 NOTE — Telephone Encounter (Signed)
-----   Message from Dalton, DO sent at 03/06/2018  7:30 AM EDT ----- Reviewed images.  Mild small vessel disease.  Jade, let pt know that MRI images didn't show anything new (stable since 2017 exam) and no reason for headache.

## 2018-03-07 ENCOUNTER — Encounter: Payer: Self-pay | Admitting: Obstetrics & Gynecology

## 2018-03-07 NOTE — Patient Instructions (Signed)
1. Well female exam with routine gynecological exam Gynecologic exam status post total hysterectomy in menopause.  No indication to repeat Pap tests at this point.  Breast exam normal.  Last mammogram negative July 2019.  Not doing colonoscopies anymore.  Health labs with family physician.  2. Postmenopausal Postmenopausal without symptoms.  3. Osteopenia of multiple sites Bone density April 2018 showed osteopenia at multiple sites.  Vitamin D supplements, calcium intake of 1.5 g/day and regular weightbearing physical activity recommended.  4. Baden-Walker grade 2 cystocele Stable cystocele with minimal symptoms.  Continue to observe.  5. Left ovarian cyst Left ovarian cyst followed by gynecology.  Will repeat a pelvic ultrasound next year.  Apoorva, it was a pleasure seeing you today!

## 2018-03-21 DIAGNOSIS — H401431 Capsular glaucoma with pseudoexfoliation of lens, bilateral, mild stage: Secondary | ICD-10-CM | POA: Diagnosis not present

## 2018-03-24 NOTE — Progress Notes (Signed)
Subjective:    Patient ID: Tamara Robertson, female    DOB: 09-03-1929, 84 y.o.   MRN: [id number]  HPI The patient is here for an acute visit.  Headaches:  She is not having any of the left-sided headaches, but is still having the pain around the left medial eyebrow.  She states she has the pain there when she touches that area.  She has felt some pain into the left cheek at times.  She did see her dentist and had x-rays and there were no issues with her teeth.  She did see neurology and was started on gabapentin, which she is taking twice daily.  She is unsure if this is helped or not.  She had an MRI of her brain, which showed no acute abnormality and was unchanged from a couple of years ago.   Sunday -2 days ago she developed a raised rash on her left cheek and chest.  She denies any itching.  She is unsure what the cause was.  She states it is getting better and fading so she is not concerned about it at this time.      Medications and allergies reviewed with patient and updated if appropriate.  Patient Active Problem List   Diagnosis Date Noted  . Chronic left-sided headaches 01/28/2018  . Depression 12/18/2017  . SBO (small bowel obstruction) (HCC) 09/20/2017  . Aortic stenosis, moderate 08/22/2017  . CAD (coronary artery disease) 08/21/2017  . Pleural effusion on right   . Streptococcal pneumonia (HCC) 06/26/2017  . Community acquired pneumonia of right lower lobe of lung (HCC) 06/25/2017  . Current chronic use of systemic steroids 06/25/2017  . Chronic lower back pain 05/14/2017  . Varicose veins of left lower extremity with complications 03/05/2017  . Bilateral hearing loss 02/01/2017  . Hypertension 07/31/2016  . Leg edema 07/31/2016  . Prediabetes 06/05/2016  . Osteopenia 12/01/2015  . Anxiety 07/19/2015  . Degenerative cervical disc 12/28/2014  . Left ovarian cyst   . Peripheral neuropathy 06/03/2012  . PVD (peripheral vascular disease) (HCC) 11/23/2010  . Bursitis  of hip 10/26/2010  . Hyperlipidemia 06/27/2010  . IMMUNE THROMBOCYTOPENIC PURPURA 06/27/2010  . Unspecified glaucoma 06/27/2010  . GERD 06/27/2010  . Irritable bowel syndrome 06/27/2010  . OVERACTIVE BLADDER 06/27/2010  . OSTEOARTHRITIS, KNEE, RIGHT 06/27/2010  . URINARY INCONTINENCE 06/27/2010    Current Outpatient Medications on File Prior to Visit  Medication Sig Dispense Refill  . Biotin 2500 MCG CAPS Take 1 capsule by mouth daily.     . Calcium-Magnesium-Vitamin D (CITRACAL CALCIUM+D) 600-40-500 MG-MG-UNIT TB24 Take 1 tablet by mouth daily.     . Flaxseed MISC Take 5 mLs by mouth daily. Take 1 tsp daily    . FLUoxetine (PROZAC) 10 MG tablet Take 10 mg by mouth daily.    . furosemide (LASIX) 20 MG tablet Take 1 tablet (20 mg total) by mouth daily. 90 tablet 3  . gabapentin (NEURONTIN) 100 MG capsule Take 1 capsule (100 mg total) by mouth 3 (three) times daily. 270 capsule 1  . latanoprost (XALATAN) 0.005 % ophthalmic solution Place 1 drop into both eyes at bedtime.    [name] losartan (COZAAR) 25 MG tablet Take 1 tablet (25 mg total) by mouth daily. 30 tablet 5  . Multiple Vitamin (MULTIVITAMIN) tablet Take 1 tablet by mouth daily.      . pantoprazole (PROTONIX) 20 MG tablet Take 20 mg by mouth daily.    . polyethylene glycol (MIRALAX / GLYCOLAX)  packet Take 17 g by mouth daily as needed for moderate constipation.     . predniSONE (DELTASONE) 2.5 MG tablet Take 1-2 tablets (2.5-5 mg total) by mouth every other day. Take 2 tablets (5mg ) on 09/23/2017, then 1 tablet (2.5mg ) on 09/24/2017, then back to home regimen of every other day. (Patient taking differently: Take 2.5-5 mg by mouth every other day. Take 2.5 tablets every other day) 30 tablet 0  . triamcinolone cream (KENALOG) 0.1 % Apply 1 application topically as needed.   1   No current facility-administered medications on file prior to visit.     Past Medical History:  Diagnosis Date  . Anxiety   . CAD (coronary artery disease)     RCA 40% stenosis cath 01/2011  . Cholelithiasis    s/p lap chole 09/2014  . Diverticular stricture [facility]) 2006   [facility]  . DIVERTICULITIS, HX OF   . Diverticulosis   . DYSLIPIDEMIA   . Elevated LFTs   . GERD   . Glaucoma   . Hepatic steatosis   . HOH (hard of hearing)   . Immune thrombocytopenic purpura (HCC)    chronic - baseline 80-100K, on pred  . Irritable bowel syndrome   . Left ovarian cyst dx 01/2013 CT   working with gyn, ?malignant - elevated tumor marker OVA1  . OSTEOARTHRITIS, KNEE, RIGHT   . OSTEOPENIA   . OVERACTIVE BLADDER   . Prolapse of female bladder, acquired 11/2017  . UNSPECIFIED PERIPHERAL VASCULAR DISEASE   . URINARY INCONTINENCE     Past Surgical History:  Procedure Laterality Date  . ABDOMINAL HYSTERECTOMY  1963  . ANGIOPLASTY    . APPENDECTOMY  1956  . CARDIAC CATHETERIZATION    . CATARACT EXTRACTION, BILATERAL  10/2010  . CHOLECYSTECTOMY N/A 09/16/2014   Procedure: LAPAROSCOPIC CHOLECYSTECTOMY ;  Surgeon: Emelia Loron, MD;  Location: [facility] OR;  Service: General;  Laterality: N/A;  . KNEE ARTHROSCOPY Right   . L pop PTA  10/2009   stent  . LAPAROSCOPIC SIGMOID COLECTOMY  10/2005  . SPLENECTOMY  1954  . VARICOSE VEIN SURGERY Right 1962    Social History   Socioeconomic History  . Marital status: Widowed    Spouse name: Not on file  . Number of children: 2  . Years of education: Not on file  . Highest education level: Not on file  Occupational History  . Occupation: Retired    Comment: Programmer, multimedia  Social Needs  . Financial resource strain: Not hard at all  . Food insecurity:    Worry: Never true    Inability: Never true  . Transportation needs:    Medical: No    Non-medical: No  Tobacco Use  . Smoking status: Never Smoker  . Smokeless tobacco: Never Used  Substance and Sexual Activity  . Alcohol use: No    Alcohol/week: 0.0 standard drinks    Comment: rarely  . Drug use: No  . Sexual activity:  Not Currently    Comment: 1st intercourse- 19, partners- 2,  widow  Lifestyle  . Physical activity:    Days per week: 0 days    Minutes per session: 0 min  . Stress: Only a little  Relationships  . Social connections:    Talks on phone: More than three times a week    Gets together: More than three times a week    Attends religious service: More than 4 times per year    Active  member of club or organization: Yes    Attends meetings of clubs or organizations: More than 4 times per year    Relationship status: Widowed  Other Topics Concern  . 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    [place] to [place] from [place] 05/2010 to be close to kids    Family History  Problem Relation Age of Onset  . Coronary artery disease Mother   . Heart attack Mother 38  . Hyperlipidemia Mother   . Hypertension Mother   . Stomach cancer Father   . Hypertension Daughter   . Hyperlipidemia Daughter   . Arthritis Unknown        parent  . Transient ischemic attack Unknown        parent  . Colon cancer Neg Hx     Review of Systems  Constitutional: Negative for chills and fever.  HENT: Negative for ear pain and mouth sores.   Eyes: Negative for visual disturbance.  Skin: Positive for rash (chest, left cheek).  Neurological: Positive for headaches (not the headaches she was having when she was here last - pain from the left eyebrow when she touches the area). Negative for dizziness and light-headedness.       Objective:   Vitals:   03/25/18 1025  BP: (!) 152/68  Pulse: 62  Resp: 16  Temp: 97.6 F (36.4 C)  SpO2: 99%   BP Readings from Last 3 Encounters:  03/25/18 (!) 152/68  03/04/18 136/70  02/25/18 140/68   Wt Readings from Last 3 Encounters:  03/25/18 148 lb (67.1 kg)  03/04/18 147 lb (66.7 kg)  02/25/18 148 lb (67.1 kg)   Body mass index is 27.96 kg/m.   Physical Exam  Constitutional: She appears well-developed and  well-nourished. No distress.  HENT:  Head: Normocephalic and atraumatic.  Tenderness with palpation of medial left eyebrow that reproduces the head pain she was having.  No tenderness in temple regions  Eyes: No scleral icterus.  Neck: Neck supple. No neck rigidity.  Lymphadenopathy:    She has no cervical adenopathy.  Skin: Skin is warm and dry. Rash (chest - raised, macular rash, blotchy, no blisters or papules) noted.           Assessment & Plan:    See Problem List for Assessment and Plan of chronic medical problems.

## 2018-03-25 ENCOUNTER — Encounter: Payer: Self-pay | Admitting: Internal Medicine

## 2018-03-25 ENCOUNTER — Ambulatory Visit (INDEPENDENT_AMBULATORY_CARE_PROVIDER_SITE_OTHER): Payer: Medicare HMO | Admitting: Internal Medicine

## 2018-03-25 VITALS — BP 152/68 | HR 62 | Temp 97.6°F | Resp 16 | Ht 61.0 in | Wt 148.0 lb

## 2018-03-25 DIAGNOSIS — H6123 Impacted cerumen, bilateral: Secondary | ICD-10-CM | POA: Diagnosis not present

## 2018-03-25 DIAGNOSIS — Z7982 Long term (current) use of aspirin: Secondary | ICD-10-CM | POA: Diagnosis not present

## 2018-03-25 DIAGNOSIS — R21 Rash and other nonspecific skin eruption: Secondary | ICD-10-CM | POA: Diagnosis not present

## 2018-03-25 DIAGNOSIS — G500 Trigeminal neuralgia: Secondary | ICD-10-CM

## 2018-03-25 DIAGNOSIS — R51 Headache: Secondary | ICD-10-CM | POA: Diagnosis not present

## 2018-03-25 DIAGNOSIS — Z974 Presence of external hearing-aid: Secondary | ICD-10-CM | POA: Diagnosis not present

## 2018-03-25 DIAGNOSIS — H903 Sensorineural hearing loss, bilateral: Secondary | ICD-10-CM | POA: Diagnosis not present

## 2018-03-25 NOTE — Assessment & Plan Note (Signed)
On chest and left cheek Resolving Asymptomatic Just monitor for now

## 2018-03-25 NOTE — Patient Instructions (Addendum)
Your headache pain may be a supraorbital neuralgia - ask your ENT and Neurologist if they think this is a possibility.

## 2018-03-25 NOTE — Assessment & Plan Note (Signed)
She has what sounds like left supraorbital neuralgia Taking gabapentin - ? Helping Sees Dr Tat in about 2 weeks Sees ENT today No signs of temporal arteritis - ESR normal when she was here last and having worse symptoms

## 2018-03-28 ENCOUNTER — Other Ambulatory Visit: Payer: Self-pay | Admitting: Internal Medicine

## 2018-04-01 ENCOUNTER — Ambulatory Visit: Payer: Medicare HMO | Admitting: Neurology

## 2018-04-01 DIAGNOSIS — M25562 Pain in left knee: Secondary | ICD-10-CM | POA: Diagnosis not present

## 2018-04-01 DIAGNOSIS — M47816 Spondylosis without myelopathy or radiculopathy, lumbar region: Secondary | ICD-10-CM | POA: Diagnosis not present

## 2018-04-08 NOTE — Progress Notes (Deleted)
Tamara Robertson was seen today in neurologic consultation at the request of Burns, Claudina Lick, MD.  The consultation is for the evaluation of veering the right when ambulating.  Pt states that sx's began about 3 months ago.  She states that she feels like she is a "car out of alignment" and always "has to correct."  She also c/o that her balance is not good but there is no falls.  It also has been going on for 3 months or so and she feels that the 2 issues are related.  No paresthesias of the feet.  No DM.  She does note that her husband died in 04/04/2023.  That same day she had a big fall when she was trying to answer the door.  She caught her shoe on the floor when trying to turn and hit the head on the door jam. The patient did have a CT of the brain after that fall on 03/10/2015.  I had the opportunity to review this.  It was unremarkable with the exception of a left parietal scalp hematoma.  She denies new medications.  She has xanax listed as a medication but she rarely takes it.  She states that she tried a 1/2 tablet the other day to see if it would help and she "slept the entire day."   Also c/o paresthesias in the scalp and a lightheaded feeling.  Occasionally, and only momentarily she will have deja vu feeling and then it is gone.     02/18/18 update: Patient is seen today for headache.  I have never seen her before for that.  In fact, I have not seen her in 2-1/2 years, at which point she was complaining about balance change.  Headaches have been going on since August.  She did not previous consider herself to be a headache type of person.  Headaches are located above the left eye and she can sometimes initiate it by pushing on it.  It can radiate to anywhere in the head or face. She describes it as a dull ache.  She has photophobia.  She has no phonophobia.  She has had no associated nausea and vomiting.  She is taking tylenol occasionally.  Headache lasts a few hours.  touching the face will initiate  it (washing face).  She saw her primary care physician.  Sed rate was checked and was 25.  CRP was normal at 0.9.  Patient did have a CT of the brain in February, 2019.  This was unremarkable for acute change.  There was mild atrophy.  04/10/18 update:  Pt seen for f/u for headache.  She had an MRI of the brain since her last visit.  I had the opportunity to review this.  There was mild small vessel disease.  Her examination was stable since her 2017 exam.  I started her on low-dose gabapentin last visit, 100 mg 3 times per day.  ALLERGIES:   Allergies  Allergen Reactions  . Aspirin Other (See Comments)    ITP  . Sulfa Antibiotics Other (See Comments)    dizziness  . Contrast Media [Iodinated Diagnostic Agents] Hives  . Statins Other (See Comments)    CURRENT MEDICATIONS:  Outpatient Encounter Medications as of 04/10/2018  Medication Sig  . Biotin 2500 MCG CAPS Take 1 capsule by mouth daily.   . Calcium-Magnesium-Vitamin D (CITRACAL CALCIUM+D) 600-40-500 MG-MG-UNIT TB24 Take 1 tablet by mouth daily.   . Flaxseed MISC Take 5 mLs by mouth daily. Take  1 tsp daily  . FLUoxetine (PROZAC) 10 MG tablet Take 10 mg by mouth daily.  . furosemide (LASIX) 20 MG tablet Take 1 tablet (20 mg total) by mouth daily.  [name] gabapentin (NEURONTIN) 100 MG capsule Take 1 capsule (100 mg total) by mouth 3 (three) times daily.  [name] latanoprost (XALATAN) 0.005 % ophthalmic solution Place 1 drop into both eyes at bedtime.  [name] losartan (COZAAR) 25 MG tablet TAKE 1 TABLET BY MOUTH ONCE DAILY  . Multiple Vitamin (MULTIVITAMIN) tablet Take 1 tablet by mouth daily.    . pantoprazole (PROTONIX) 20 MG tablet Take 20 mg by mouth daily.  . polyethylene glycol (MIRALAX / GLYCOLAX) packet Take 17 g by mouth daily as needed for moderate constipation.   . predniSONE (DELTASONE) 2.5 MG tablet Take 1-2 tablets (2.5-5 mg total) by mouth every other day. Take 2 tablets (5mg ) on 09/23/2017, then 1 tablet (2.5mg ) on 09/24/2017, then back to  home regimen of every other day. (Patient taking differently: Take 2.5-5 mg by mouth every other day. Take 2.5 tablets every other day)  . triamcinolone cream (KENALOG) 0.1 % Apply 1 application topically as needed.    No facility-administered encounter medications on file as of 04/10/2018.     PAST MEDICAL HISTORY:   Past Medical History:  Diagnosis Date  . Anxiety   . CAD (coronary artery disease)    RCA 40% stenosis cath 01/2011  . Cholelithiasis    s/p lap chole 09/2014  . Diverticular stricture [facility]) 2006   [facility]  . DIVERTICULITIS, HX OF   . Diverticulosis   . DYSLIPIDEMIA   . Elevated LFTs   . GERD   . Glaucoma   . Hepatic steatosis   . HOH (hard of hearing)   . Immune thrombocytopenic purpura (HCC)    chronic - baseline 80-100K, on pred  . Irritable bowel syndrome   . Left ovarian cyst dx 01/2013 CT   working with gyn, ?malignant - elevated tumor marker OVA1  . OSTEOARTHRITIS, KNEE, RIGHT   . OSTEOPENIA   . OVERACTIVE BLADDER   . Prolapse of female bladder, acquired 11/2017  . UNSPECIFIED PERIPHERAL VASCULAR DISEASE   . URINARY INCONTINENCE     PAST SURGICAL HISTORY:   Past Surgical History:  Procedure Laterality Date  . ABDOMINAL HYSTERECTOMY  1963  . ANGIOPLASTY    . APPENDECTOMY  1956  . CARDIAC CATHETERIZATION    . CATARACT EXTRACTION, BILATERAL  10/2010  . CHOLECYSTECTOMY N/A 09/16/2014   Procedure: LAPAROSCOPIC CHOLECYSTECTOMY ;  Surgeon: Rolm Bookbinder, MD;  Location: [facility];  Service: General;  Laterality: N/A;  . KNEE ARTHROSCOPY Right   . L pop PTA  10/2009   stent  . LAPAROSCOPIC SIGMOID COLECTOMY  10/2005  . SPLENECTOMY  1954  . VARICOSE VEIN SURGERY Right 1962    SOCIAL HISTORY:   Social History   Socioeconomic History  . Marital status: Widowed    Spouse name: Not on file  . Number of children: 2  . Years of education: Not on file  . Highest education level: Not on file  Occupational History  . Occupation:  Retired    Comment: Retail banker  Social Needs  . Financial resource strain: Not hard at all  . Food insecurity:    Worry: Never true    Inability: Never true  . Transportation needs:    Medical: No    Non-medical: No  Tobacco Use  . Smoking status: Never Smoker  .  Smokeless tobacco: Never Used  Substance and Sexual Activity  . Alcohol use: No    Alcohol/week: 0.0 standard drinks    Comment: rarely  . Drug use: No  . Sexual activity: Not Currently    Comment: 1st intercourse- 19, partners- 2,  widow  Lifestyle  . Physical activity:    Days per week: 0 days    Minutes per session: 0 min  . Stress: Only a little  Relationships  . Social connections:    Talks on phone: More than three times a week    Gets together: More than three times a week    Attends religious service: More than 4 times per year    Active member of club or organization: Yes    Attends meetings of clubs or organizations: More than 4 times per year    Relationship status: Widowed  . Intimate partner violence:    Fear of current or ex partner: Not on file    Emotionally abused: Not on file    Physically abused: Not on file    Forced sexual activity: Not on file  Other Topics Concern  . 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    Dorie Rank to [place] from [place] 05/2010 to be close to kids    FAMILY HISTORY:   Family Status  Relation Name Status  . Mother  Deceased at age 41       TIA/ MI  . Father  Deceased at age 69       stomach cancer  . Child  Deceased at age 21       1, MVA  . Child  Alive       2, healthy  . Daughter  (Not Specified)  . Unknown  (Not Specified)  . Unknown  (Not Specified)  . MGM  Deceased  . MGF  Deceased  . PGM  Deceased  . PGF  Deceased  . Neg Hx  (Not Specified)    ROS:  ROS   PHYSICAL EXAMINATION:    VITALS:   There were no vitals filed for this visit.   Gen:  Appears stated age and in  NAD. HEENT:  Normocephalic, atraumatic. The mucous membranes are moist. The superficial temporal arteries are without ropiness or tenderness.   Cardiovascular: Regular rate and rhythm. Lungs: Clear to auscultation bilaterally. Neck: There are no carotid bruits noted bilaterally.  NEUROLOGICAL:  Orientation:  The patient is alert and oriented x 3.   Cranial nerves: There is good facial symmetry. The pupils are pinpoint round and just slightly reactive to light bilaterally. Fundoscopic exam is attempted but the disc margins are not well visualized bilaterally.  Extraocular muscles are intact and visual fields are full to confrontational testing. Speech is fluent and clear. Soft palate rises symmetrically and there is no tongue deviation. Hearing is intact to conversational tone. Tone: Tone is good throughout. Sensation: Sensation is intact to light touch x 4 Coordination:  The patient has no difficulty with RAM's or FNF bilaterally. Motor: Strength is 5/5 in the bilateral upper and lower extremities.  Shoulder shrug is equal bilaterally.  There is no pronator drift.  There are no fasciculations noted. DTR's: Deep tendon reflexes are 2/4 at the bilateral biceps, triceps, brachioradialis, patella and achilles.  Plantar responses are downgoing bilaterally. Gait and Station: The patient is able to ambulate without difficulty.     LABS    Chemistry  Component Value Date/Time   NA 140 01/28/2018 1607   NA 142 02/16/2016 1526   K 4.2 01/28/2018 1607   K 4.3 02/16/2016 1526   CL 104 01/28/2018 1607   CL 105 04/17/2012 1452   CO2 30 01/28/2018 1607   CO2 27 02/16/2016 1526   BUN 23 01/28/2018 1607   BUN 14.3 02/16/2016 1526   CREATININE 0.92 01/28/2018 1607   CREATININE 0.8 02/16/2016 1526      Component Value Date/Time   CALCIUM 9.4 01/28/2018 1607   CALCIUM 9.5 02/16/2016 1526   ALKPHOS 73 01/28/2018 1607   ALKPHOS 92 02/16/2016 1526   AST 15 01/28/2018 1607   AST 19 02/16/2016  1526   ALT 10 01/28/2018 1607   ALT 11 02/16/2016 1526   BILITOT 0.3 01/28/2018 1607   BILITOT 0.33 02/16/2016 1526     Lab Results  Component Value Date   TSH 2.05 07/23/2017   Lab Results  Component Value Date   VITAMINB12 421 07/31/2016     Lab Results  Component Value Date   WBC 10.6 (H) 01/28/2018   HGB 12.8 01/28/2018   HCT 38.0 01/28/2018   MCV 83.0 01/28/2018   PLT 184.0 01/28/2018      IMPRESSION/PLAN  1.  New onset daily persistent headache   -some of features of headache sound similar to TN, but hers involve both sides of face.  In addition, symptoms rotate/radiate fast.  She pushed on L frontal region at beginning of the visit and said it initiated head pain but by the end of the visit, stated that it was the vertex that hurt  -We will do an MRI of the brain.  -talked about gabapentin and will start with 100 mg tid.  If it doesn't help will try trileptal or even VPA.  She is to call me if any problems  2.  F/u 8 weeks.  Much greater than 50% of this visit was spent in counseling and coordinating care.  Total face to face time:  25 min

## 2018-04-10 ENCOUNTER — Encounter

## 2018-04-10 ENCOUNTER — Ambulatory Visit: Payer: Medicare HMO | Admitting: Neurology

## 2018-04-12 DIAGNOSIS — M25511 Pain in right shoulder: Secondary | ICD-10-CM | POA: Diagnosis not present

## 2018-04-16 DIAGNOSIS — S83242A Other tear of medial meniscus, current injury, left knee, initial encounter: Secondary | ICD-10-CM | POA: Diagnosis not present

## 2018-04-16 DIAGNOSIS — M1712 Unilateral primary osteoarthritis, left knee: Secondary | ICD-10-CM | POA: Diagnosis not present

## 2018-04-18 ENCOUNTER — Telehealth: Payer: Self-pay | Admitting: Oncology

## 2018-04-18 NOTE — Telephone Encounter (Signed)
Returned patient call re cancelling 12/17 lab/fu. Not able to reach patient and left message with my information confirming cancellationa and asking that patient call me directly when she is ready to reschedule. Per message patient called multiple times.

## 2018-04-18 NOTE — Telephone Encounter (Signed)
Returned [facility].  Could not reach patient.  Asked patient to call us back.

## 2018-04-21 DIAGNOSIS — L309 Dermatitis, unspecified: Secondary | ICD-10-CM | POA: Diagnosis not present

## 2018-04-21 DIAGNOSIS — L820 Inflamed seborrheic keratosis: Secondary | ICD-10-CM | POA: Diagnosis not present

## 2018-04-21 DIAGNOSIS — L821 Other seborrheic keratosis: Secondary | ICD-10-CM | POA: Diagnosis not present

## 2018-04-22 ENCOUNTER — Inpatient Hospital Stay: Payer: Medicare HMO

## 2018-04-22 ENCOUNTER — Inpatient Hospital Stay: Payer: Medicare HMO | Admitting: Oncology

## 2018-04-23 ENCOUNTER — Ambulatory Visit: Payer: Medicare HMO | Admitting: Neurology

## 2018-04-29 ENCOUNTER — Telehealth: Payer: Self-pay | Admitting: Oncology

## 2018-04-29 NOTE — Telephone Encounter (Signed)
Returned call and spoke with patient re new appointment for lab/fu 06/17/2018 - rescheduled from December.

## 2018-05-01 ENCOUNTER — Ambulatory Visit: Payer: Medicare HMO | Admitting: Internal Medicine

## 2018-05-14 DIAGNOSIS — M1712 Unilateral primary osteoarthritis, left knee: Secondary | ICD-10-CM | POA: Diagnosis not present

## 2018-05-15 NOTE — Progress Notes (Signed)
Tamara Robertson was seen today in neurologic consultation at the request of Burns, Claudina Lick, MD.  The consultation is for the evaluation of veering the right when ambulating.  Pt states that sx's began about 3 months ago.  She states that she feels like she is a "car out of alignment" and always "has to correct."  She also c/o that her balance is not good but there is no falls.  It also has been going on for 3 months or so and she feels that the 2 issues are related.  No paresthesias of the feet.  No DM.  She does note that her husband died in 04-18-2023.  That same day she had a big fall when she was trying to answer the door.  She caught her shoe on the floor when trying to turn and hit the head on the door jam. The patient did have a CT of the brain after that fall on 03/10/2015.  I had the opportunity to review this.  It was unremarkable with the exception of a left parietal scalp hematoma.  She denies new medications.  She has xanax listed as a medication but she rarely takes it.  She states that she tried a 1/2 tablet the other day to see if it would help and she "slept the entire day."   Also c/o paresthesias in the scalp and a lightheaded feeling.  Occasionally, and only momentarily she will have deja vu feeling and then it is gone.     02/18/18 update: Patient is seen today for headache.  I have never seen her before for that.  In fact, I have not seen her in 2-1/2 years, at which point she was complaining about balance change.  Headaches have been going on since August.  She did not previous consider herself to be a headache type of person.  Headaches are located above the left eye and she can sometimes initiate it by pushing on it.  It can radiate to anywhere in the head or face. She describes it as a dull ache.  She has photophobia.  She has no phonophobia.  She has had no associated nausea and vomiting.  She is taking tylenol occasionally.  Headache lasts a few hours.  touching the face will initiate  it (washing face).  She saw her primary care physician.  Sed rate was checked and was 25.  CRP was normal at 0.9.  Patient did have a CT of the brain in February, 2019.  This was unremarkable for acute change.  There was mild atrophy.  05/16/18 update: Patient seen today for new onset daily persistent headache.  She was started on gabapentin last visit and told to take 100 mg 3 times daily.  She reports today that "I took it for 3 weeks."  "I took myself off of it because its heavy stuff."  She had no SE.   Head pain has been gone until this week and having "very mild head pressure."  She thinks that is sinus and took tylenol sinus with relief.  She had an MRI of the brain on March 05, 2018.  I had the opportunity to review this.  There is mild small vessel disease.  This was stable since 2017.  I reviewed that with her.  ALLERGIES:   Allergies  Allergen Reactions  . Aspirin Other (See Comments)    ITP  . Sulfa Antibiotics Other (See Comments)    dizziness  . Contrast Media [Iodinated Diagnostic Agents]  Hives  . Statins Other (See Comments)    CURRENT MEDICATIONS:  Outpatient Encounter Medications as of 05/16/2018  Medication Sig  . Biotin 2500 MCG CAPS Take 1 capsule by mouth daily.   . Calcium-Magnesium-Vitamin D (CITRACAL CALCIUM+D) 600-40-500 MG-MG-UNIT TB24 Take 1 tablet by mouth daily.   . Flaxseed MISC Take 5 mLs by mouth daily. Take 1 tsp daily  . FLUoxetine (PROZAC) 10 MG tablet Take 10 mg by mouth daily.  . furosemide (LASIX) 20 MG tablet Take 1 tablet (20 mg total) by mouth daily.  [name] latanoprost (XALATAN) 0.005 % ophthalmic solution Place 1 drop into both eyes at bedtime.  [name] losartan (COZAAR) 25 MG tablet TAKE 1 TABLET BY MOUTH ONCE DAILY  . Multiple Vitamin (MULTIVITAMIN) tablet Take 1 tablet by mouth daily.    . pantoprazole (PROTONIX) 20 MG tablet Take 20 mg by mouth daily.  . polyethylene glycol (MIRALAX / GLYCOLAX) packet Take 17 g by mouth daily as needed for moderate  constipation.   . predniSONE (DELTASONE) 2.5 MG tablet Take 1-2 tablets (2.5-5 mg total) by mouth every other day. Take 2 tablets (5mg ) on 09/23/2017, then 1 tablet (2.5mg ) on 09/24/2017, then back to home regimen of every other day. (Patient taking differently: Take 2.5 mg by mouth every other day. Take 2.5 tablets every other day)  . triamcinolone cream (KENALOG) 0.1 % Apply 1 application topically as needed.   . [DISCONTINUED] gabapentin (NEURONTIN) 100 MG capsule Take 1 capsule (100 mg total) by mouth 3 (three) times daily.   No facility-administered encounter medications on file as of 05/16/2018.     PAST MEDICAL HISTORY:   Past Medical History:  Diagnosis Date  . Anxiety   . CAD (coronary artery disease)    RCA 40% stenosis cath 01/2011  . Cholelithiasis    s/p lap chole 09/2014  . Diverticular stricture [facility] 2006   [facility]  . DIVERTICULITIS, HX OF   . Diverticulosis   . DYSLIPIDEMIA   . Elevated LFTs   . GERD   . Glaucoma   . Hepatic steatosis   . HOH (hard of hearing)   . Immune thrombocytopenic purpura (HCC)    chronic - baseline 80-100K, on pred  . Irritable bowel syndrome   . Left ovarian cyst dx 01/2013 CT   working with gyn, ?malignant - elevated tumor marker OVA1  . OSTEOARTHRITIS, KNEE, RIGHT   . OSTEOPENIA   . OVERACTIVE BLADDER   . Prolapse of female bladder, acquired 11/2017  . UNSPECIFIED PERIPHERAL VASCULAR DISEASE   . URINARY INCONTINENCE     PAST SURGICAL HISTORY:   Past Surgical History:  Procedure Laterality Date  . ABDOMINAL HYSTERECTOMY  1963  . ANGIOPLASTY    . APPENDECTOMY  1956  . CARDIAC CATHETERIZATION    . CATARACT EXTRACTION, BILATERAL  10/2010  . CHOLECYSTECTOMY N/A 09/16/2014   Procedure: LAPAROSCOPIC CHOLECYSTECTOMY ;  Surgeon: Rolm Bookbinder, MD;  Location: [facility];  Service: General;  Laterality: N/A;  . KNEE ARTHROSCOPY Right   . L pop PTA  10/2009   stent  . LAPAROSCOPIC SIGMOID COLECTOMY  10/2005  .  SPLENECTOMY  1954  . VARICOSE VEIN SURGERY Right 1962    SOCIAL HISTORY:   Social History   Socioeconomic History  . Marital status: Widowed    Spouse name: Not on file  . Number of children: 2  . Years of education: Not on file  . Highest education level: Not on file  Occupational History  .  Occupation: Retired    Comment: Retail banker  Social Needs  . Financial resource strain: Not hard at all  . Food insecurity:    Worry: Never true    Inability: Never true  . Transportation needs:    Medical: No    Non-medical: No  Tobacco Use  . Smoking status: Never Smoker  . Smokeless tobacco: Never Used  Substance and Sexual Activity  . Alcohol use: No    Alcohol/week: 0.0 standard drinks    Comment: rarely  . Drug use: No  . Sexual activity: Not Currently    Comment: 1st intercourse- 19, partners- 2,  widow  Lifestyle  . Physical activity:    Days per week: 0 days    Minutes per session: 0 min  . Stress: Only a little  Relationships  . Social connections:    Talks on phone: More than three times a week    Gets together: More than three times a week    Attends religious service: More than 4 times per year    Active member of club or organization: Yes    Attends meetings of clubs or organizations: More than 4 times per year    Relationship status: Widowed  . Intimate partner violence:    Fear of current or ex partner: Not on file    Emotionally abused: Not on file    Physically abused: Not on file    Forced sexual activity: Not on file  Other Topics Concern  . 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    Dorie Rank to [place] from [place] 05/2010 to be close to kids    FAMILY HISTORY:   Family Status  Relation Name Status  . Mother  Deceased at age 26       TIA/ MI  . Father  Deceased at age 56       stomach cancer  . Child  Deceased at age 34       1, MVA  . Child  Alive       2, healthy  .  Daughter  (Not Specified)  . Unknown  (Not Specified)  . Unknown  (Not Specified)  . MGM  Deceased  . MGF  Deceased  . PGM  Deceased  . PGF  Deceased  . Neg Hx  (Not Specified)    ROS:  Review of Systems  Constitutional: Negative.   HENT: Negative.   Eyes: Positive for blurred vision (due to dry eye).  Respiratory: Negative.   Cardiovascular: Negative.   Gastrointestinal: Negative.   Genitourinary: Negative.   Musculoskeletal: Negative.   Skin: Negative.      PHYSICAL EXAMINATION:    VITALS:   Vitals:   05/16/18 1454  BP: 102/60  Pulse: 84  Weight: 149 lb (67.6 kg)  Height: 5\' 1"  (1.549 m)     GEN:  The patient appears stated age and is in NAD. HEENT:  Normocephalic, atraumatic.  The mucous membranes are moist. The superficial temporal arteries are without ropiness or tenderness. CV:  RRR Lungs:  CTAB Neck/HEME:  There are no carotid bruits bilaterally.  Neurological examination:  Orientation: The patient is alert and oriented x3. Cranial nerves: There is good facial symmetry. The speech is fluent and clear. Soft palate rises symmetrically and there is no tongue deviation. Hearing is intact to conversational tone. Sensation: Sensation is intact to light touch throughout Motor: Strength is 5/5 in the bilateral  upper and lower extremities.   Shoulder shrug is equal and symmetric.  There is no pronator drift.  Movement examination: Tone: There is normal tone in the upper and lower extremities. Abnormal movements: None Coordination:  There is no decremation with RAM's Gait and Station: The patient ambulates well in the hall.     LABS    Chemistry      Component Value Date/Time   NA 140 01/28/2018 1607   NA 142 02/16/2016 1526   K 4.2 01/28/2018 1607   K 4.3 02/16/2016 1526   CL 104 01/28/2018 1607   CL 105 04/17/2012 1452   CO2 30 01/28/2018 1607   CO2 27 02/16/2016 1526   BUN 23 01/28/2018 1607   BUN 14.3 02/16/2016 1526   CREATININE 0.92  01/28/2018 1607   CREATININE 0.8 02/16/2016 1526      Component Value Date/Time   CALCIUM 9.4 01/28/2018 1607   CALCIUM 9.5 02/16/2016 1526   ALKPHOS 73 01/28/2018 1607   ALKPHOS 92 02/16/2016 1526   AST 15 01/28/2018 1607   AST 19 02/16/2016 1526   ALT 10 01/28/2018 1607   ALT 11 02/16/2016 1526   BILITOT 0.3 01/28/2018 1607   BILITOT 0.33 02/16/2016 1526     Lab Results  Component Value Date   TSH 2.05 07/23/2017   Lab Results  Component Value Date   VITAMINB12 421 07/31/2016     Lab Results  Component Value Date   WBC 10.6 (H) 01/28/2018   HGB 12.8 01/28/2018   HCT 38.0 01/28/2018   MCV 83.0 01/28/2018   PLT 184.0 01/28/2018      IMPRESSION/PLAN  1.  New onset daily persistent headache   -now resolved and off medication  2.  F/u prn

## 2018-05-16 ENCOUNTER — Encounter: Payer: Self-pay | Admitting: Neurology

## 2018-05-16 ENCOUNTER — Ambulatory Visit: Payer: Medicare HMO | Admitting: Neurology

## 2018-05-16 VITALS — BP 102/60 | HR 84 | Ht 61.0 in | Wt 149.0 lb

## 2018-05-16 DIAGNOSIS — G4452 New daily persistent headache (NDPH): Secondary | ICD-10-CM | POA: Diagnosis not present

## 2018-05-27 ENCOUNTER — Encounter: Payer: Self-pay | Admitting: Internal Medicine

## 2018-05-27 ENCOUNTER — Ambulatory Visit (INDEPENDENT_AMBULATORY_CARE_PROVIDER_SITE_OTHER): Payer: Medicare HMO | Admitting: Internal Medicine

## 2018-05-27 VITALS — BP 122/70 | HR 82 | Ht 61.0 in | Wt 149.0 lb

## 2018-05-27 DIAGNOSIS — R1013 Epigastric pain: Secondary | ICD-10-CM | POA: Diagnosis not present

## 2018-05-27 DIAGNOSIS — K219 Gastro-esophageal reflux disease without esophagitis: Secondary | ICD-10-CM

## 2018-05-27 DIAGNOSIS — K581 Irritable bowel syndrome with constipation: Secondary | ICD-10-CM

## 2018-05-27 MED ORDER — FAMOTIDINE 20 MG PO TABS: 20.0000 mg | ORAL_TABLET | Freq: Every evening | ORAL | 2 refills | Status: DC

## 2018-05-27 MED ORDER — PANTOPRAZOLE SODIUM 20 MG PO TBEC: 20.0000 mg | DELAYED_RELEASE_TABLET | ORAL | 3 refills | Status: DC

## 2018-05-27 NOTE — Progress Notes (Signed)
Subjective:    Patient ID: Tamara Robertson, female    DOB: 15-Dec-1929, 85 y.o.   MRN: [id number]  HPI Laquida Stroup is an 85 year old female with a past medical history of GERD, IBS with constipation, history of complicated diverticular disease status post sigmoidectomy in 2007 for sigmoid stricture, peripheral vascular disease, ITP, prior cholecystectomy and splenectomy, intermittent small bowel obstruction felt due to adhesive disease who is here for follow-up.  She is here alone today.  She was last seen on 09/25/2017 by Mike Gip, PA-C  She was hospitalized in May 2019 with a small bowel obstruction.  Surgery was considered but responded to medical management.  She reports that on the whole she has been doing well.  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  She enjoys this.  She is felt some epigastric abdominal discomfort which she wonders if is related to the underwire in her bra.  Does not seem to be affected by eating.  She has noticed a somewhat upper abdominal bloating and burping and occasional hiccuping which she states sounds like a "bark".  Not much in the way of heartburn and no dysphagia or odynophagia.  There is been no weight loss, early satiety and reports a very good appetite.  She does take pantoprazole 20 mg daily and when last seen here ranitidine was suggested but she never started.  Bowels vary and fluctuate they can be loose and one so she avoids MiraLAX.  She uses MiraLAX on most evenings as she is prone to constipation.  No blood in her stool or melena.   Review of Systems As per HPI, otherwise negative  Current Medications, Allergies, Past Medical History, Past Surgical History, Family History and Social History were reviewed in [employer] record.     Objective:   Physical Exam BP 122/70   Pulse 82   Ht 5\' 1"  (1.549 m)   Wt 149 lb (67.6 kg)   BMI 28.15 kg/m  Constitutional: Well-developed and well-nourished. No  distress. HEENT: Normocephalic and atraumatic.  Conjunctivae are normal.  No scleral icterus. Neck: Neck supple. Trachea midline. Cardiovascular: Normal rate, regular rhythm and intact distal pulses. No M/R/G Pulmonary/chest: Effort normal and breath sounds normal. No wheezing, rales or rhonchi. Abdominal: Soft, mild epigastric tenderness without rebound or guarding, nondistended. Bowel sounds active throughout. There are no masses palpable. No hepatosplenomegaly. Extremities: no clubbing, cyanosis, or edema Neurological: Alert and oriented to person place and time. Skin: Skin is warm and dry. Psychiatric: Normal mood and affect. Behavior is normal.  CBC    Component Value Date/Time   WBC 10.6 (H) 01/28/2018 1607   RBC 4.58 01/28/2018 1607   HGB 12.8 01/28/2018 1607   HGB 13.9 04/17/2017 1301   HCT 38.0 01/28/2018 1607   HCT 42.2 04/17/2017 1301   PLT 184.0 01/28/2018 1607   PLT 206 04/17/2017 1301   MCV 83.0 01/28/2018 1607   MCV 86.7 04/17/2017 1301   MCH 28.0 10/15/2017 1249   MCHC 33.6 01/28/2018 1607   RDW 13.8 01/28/2018 1607   RDW 14.3 04/17/2017 1301   LYMPHSABS 3.6 01/28/2018 1607   LYMPHSABS 3.5 (H) 04/17/2017 1301   MONOABS 0.7 01/28/2018 1607   MONOABS 1.2 (H) 04/17/2017 1301   EOSABS 0.1 01/28/2018 1607   EOSABS 0.2 04/17/2017 1301   BASOSABS 0.1 01/28/2018 1607   BASOSABS 0.0 04/17/2017 1301   CMP     Component Value Date/Time   NA 140 01/28/2018 1607  NA 142 02/16/2016 1526   K 4.2 01/28/2018 1607   K 4.3 02/16/2016 1526   CL 104 01/28/2018 1607   CL 105 04/17/2012 1452   CO2 30 01/28/2018 1607   CO2 27 02/16/2016 1526   GLUCOSE 152 (H) 01/28/2018 1607   GLUCOSE 184 (H) 02/16/2016 1526   GLUCOSE 122 (H) 04/17/2012 1452   BUN 23 01/28/2018 1607   BUN 14.3 02/16/2016 1526   CREATININE 0.92 01/28/2018 1607   CREATININE 0.8 02/16/2016 1526   CALCIUM 9.4 01/28/2018 1607   CALCIUM 9.5 02/16/2016 1526   PROT 7.4 01/28/2018 1607   PROT 7.4 02/16/2016  1526   ALBUMIN 4.2 01/28/2018 1607   ALBUMIN 3.7 02/16/2016 1526   AST 15 01/28/2018 1607   AST 19 02/16/2016 1526   ALT 10 01/28/2018 1607   ALT 11 02/16/2016 1526   ALKPHOS 73 01/28/2018 1607   ALKPHOS 92 02/16/2016 1526   BILITOT 0.3 01/28/2018 1607   BILITOT 0.33 02/16/2016 1526   GFRNONAA >60 09/22/2017 0416   GFRAA >60 09/22/2017 0416   CT ABDOMEN AND PELVIS WITHOUT CONTRAST   TECHNIQUE: Multidetector CT imaging of the abdomen and pelvis was performed following the standard protocol without IV contrast.   COMPARISON:  Pelvic ultrasound January 08, 2017 and CT abdomen and pelvis April 22, 2015   FINDINGS: LOWER CHEST: Lung bases are clear. The visualized heart size is normal. No pericardial effusion. Minimal retained versus refluxed contrast in distal esophagus.   HEPATOBILIARY: Status post cholecystectomy. Negative noncontrast CT liver.   PANCREAS: Normal.   SPLEEN: Surgically absent.   ADRENALS/URINARY TRACT: Kidneys are orthotopic, demonstrating normal size and morphology. No nephrolithiasis, hydronephrosis; limited assessment for renal masses on this nonenhanced examination. Known RIGHT upper pole cyst not conspicuous by noncontrast CT. The unopacified ureters are normal in course and caliber. Urinary bladder is partially distended and unremarkable. Normal adrenal glands.   STOMACH/BOWEL: Sigmoid bowel anastomosis. Mild descending and residual sigmoid colonic diverticulosis. Moderate to large amount of retained large bowel stool. Mild circumferential RIGHT lower quadrant small bowel wall thickening (series 2, image 60), bowel is apposed to the anterior abdominal wall. Small bowel proximal to wall thickening is 3 cm.   VASCULAR/LYMPHATIC: Aortoiliac vessels are normal in course and caliber. Mild calcific atherosclerosis. No lymphadenopathy by CT size criteria.   REPRODUCTIVE: Homogeneously hypodense 3.4 cm LEFT ovarian cyst, relatively stable from  2016, likely benign. Status post hysterectomy.   OTHER: No intraperitoneal free fluid or free air.   MUSCULOSKELETAL: Non-acute. Moderate spondylosis. Small calcified probable disc extrusion L1. Anterior abdominal wall scarring.   IMPRESSION: 1. Short segment small bowel wall thickening RIGHT lower quadrant resulting in low-grade bowel obstruction, possible adhesion. Findings may also be infectious or inflammatory, less likely neoplastic. 2. Moderate amount of retained large bowel stool. Mild colonic diverticulosis.   Aortic Atherosclerosis (ICD10-I70.0).     Electronically Signed   By: Awilda Metro M.D.   On: 09/20/2017 16:07      Assessment & Plan:  85 year old female with a past medical history of GERD, IBS with constipation, history of complicated diverticular disease status post sigmoidectomy in 2007 for sigmoid stricture, peripheral vascular disease, ITP, prior cholecystectomy and splenectomy, intermittent small bowel obstruction felt due to adhesive disease who is here for follow-up.  1. GERD/belching --suspicious for uncontrolled reflux disease.  We will continue pantoprazole 20 mg but she will move this to 30 minutes before her first meal of the day.  Add famotidine 20 mg in the  evening.  She is asked to notify me if symptoms fail to improve.  2.  Epigastric pain --unremarkable CT scan for this symptom.  It may be due to her kyphosis and the underwire bra.  Have asked that she change to a non-underwire bra.  We will also see if improving acid control improves the symptom.  If not we can consider endoscopy.  3.  IBS with constipation predominance --continue MiraLAX 17 g at night.  She titrates the dose as needed.  4.  History of small bowel obstruction felt secondary to adhesive disease --no further obstructive symptoms since SBO in May 2019.  Will monitor going forward.  25 minutes spent with the patient today. Greater than 50% was spent in counseling and  coordination of care with the patient

## 2018-05-27 NOTE — Patient Instructions (Signed)
We have sent the following medications to your pharmacy for you to pick up at your convenience: Pantoprazole 20 mg every morning 30 minutes before breakfast. Pepcid 20 mg every evening (OTC)  Continue Miralax at bedtime (may titrate as needed).  Consider an underwire free bra to help with upper abdominal pain.  If you are age 84 or older, your body mass index should be between 23-30. Your Body mass index is 28.15 kg/m. If this is out of the aforementioned range listed, please consider follow up with your Primary Care Provider.  If you are age 32 or younger, your body mass index should be between 19-25. Your Body mass index is 28.15 kg/m. If this is out of the aformentioned range listed, please consider follow up with your Primary Care Provider.

## 2018-06-04 DIAGNOSIS — M1712 Unilateral primary osteoarthritis, left knee: Secondary | ICD-10-CM | POA: Diagnosis not present

## 2018-06-11 DIAGNOSIS — M1712 Unilateral primary osteoarthritis, left knee: Secondary | ICD-10-CM | POA: Diagnosis not present

## 2018-06-17 ENCOUNTER — Inpatient Hospital Stay: Payer: Medicare HMO

## 2018-06-17 ENCOUNTER — Telehealth: Payer: Self-pay | Admitting: Oncology

## 2018-06-17 ENCOUNTER — Inpatient Hospital Stay: Payer: Medicare HMO | Attending: Oncology | Admitting: Oncology

## 2018-06-17 VITALS — BP 152/67 | HR 86 | Temp 97.5°F | Resp 17 | Ht 61.0 in | Wt 149.1 lb

## 2018-06-17 DIAGNOSIS — D693 Immune thrombocytopenic purpura: Secondary | ICD-10-CM

## 2018-06-17 DIAGNOSIS — M818 Other osteoporosis without current pathological fracture: Secondary | ICD-10-CM | POA: Insufficient documentation

## 2018-06-17 LAB — CBC WITH DIFFERENTIAL (CANCER CENTER ONLY)
Abs Immature Granulocytes: 0.05 10*3/uL (ref 0.00–0.07)
BASOS ABS: 0 10*3/uL (ref 0.0–0.1)
Basophils Relative: 0 %
Eosinophils Absolute: 0.3 10*3/uL (ref 0.0–0.5)
Eosinophils Relative: 2 %
HCT: 40.5 % (ref 36.0–46.0)
Hemoglobin: 12.7 g/dL (ref 12.0–15.0)
Immature Granulocytes: 0 %
LYMPHS PCT: 29 %
Lymphs Abs: 4.1 10*3/uL — ABNORMAL HIGH (ref 0.7–4.0)
MCH: 27 pg (ref 26.0–34.0)
MCHC: 31.4 g/dL (ref 30.0–36.0)
MCV: 86.2 fL (ref 80.0–100.0)
Monocytes Absolute: 1.4 10*3/uL — ABNORMAL HIGH (ref 0.1–1.0)
Monocytes Relative: 10 %
NRBC: 0 % (ref 0.0–0.2)
Neutro Abs: 8.4 10*3/uL — ABNORMAL HIGH (ref 1.7–7.7)
Neutrophils Relative %: 59 %
Platelet Count: 206 10*3/uL (ref 150–400)
RBC: 4.7 MIL/uL (ref 3.87–5.11)
RDW: 14.1 % (ref 11.5–15.5)
WBC Count: 14.3 10*3/uL — ABNORMAL HIGH (ref 4.0–10.5)

## 2018-06-17 NOTE — Progress Notes (Signed)
Hematology and Oncology Follow Up Visit  Tamara Robertson [id number] March 21, 1930 85 y.o. 06/17/2018 3:12 PM   Principle Diagnosis: 85 year old with ITP diagnosed in the 1950s but has been in remission since the early 2000's.    Prior Therapy:  She is S/P splenectomy and subsequently treated with high doses of steroids. The patient have had a complete response to steroids back in the 60s and all the way have had a few relapses. Every time she has a relapse she gets restarted on high-dose of steroids and she achieved a complete response. The most recent of relapses before her move to [facility] she was hospitalized for a platelet count of 8000 around the year 2000.  Current therapy: Prednisone to 2.5 mg every other day.   Interim History: Tamara Robertson returns today for repeat evaluation.  Since last visit, she reports no major changes in her health.  She denies any active bleeding or bruising.  She continues to tolerate prednisone at a lower dose without any issues.  She denies any recent hospitalizations.  She does report some occasional dyspepsia related to GERD but otherwise no other complaints.  No hospitalizations or illnesses.  Patient denied any alteration mental status, neuropathy, confusion or dizziness.  Denies any headaches or lethargy.  Denies any night sweats, weight loss or changes in appetite.  Denied orthopnea, dyspnea on exertion or chest discomfort.  Denies shortness of breath, difficulty breathing hemoptysis or cough.  Denies any abdominal distention, nausea, early satiety or dyspepsia.  Denies any hematuria, frequency, dysuria or nocturia.  Denies any skin irritation, dryness or rash.  Denies any ecchymosis or petechiae.  Denies any lymphadenopathy or clotting.  Denies any heat or cold intolerance.  Denies any anxiety or depression.  Remaining review of system is negative.    Medications: I have reviewed the patient's current medications.  Current Outpatient Medications  Medication Sig  Dispense Refill  . Biotin 2500 MCG CAPS Take 1 capsule by mouth daily.     . Calcium-Magnesium-Vitamin D (CITRACAL CALCIUM+D) 600-40-500 MG-MG-UNIT TB24 Take 1 tablet by mouth daily.     . famotidine (PEPCID) 20 MG tablet Take 1 tablet (20 mg total) by mouth every evening. 30 tablet 2  . Flaxseed MISC Take 5 mLs by mouth daily. Take 1 tsp daily    . FLUoxetine (PROZAC) 10 MG tablet Take 10 mg by mouth daily.    . furosemide (LASIX) 20 MG tablet Take 1 tablet (20 mg total) by mouth daily. 90 tablet 3  . latanoprost (XALATAN) 0.005 % ophthalmic solution Place 1 drop into both eyes at bedtime.    [name] losartan (COZAAR) 25 MG tablet TAKE 1 TABLET BY MOUTH ONCE DAILY 90 tablet 0  . Multiple Vitamin (MULTIVITAMIN) tablet Take 1 tablet by mouth daily.      . pantoprazole (PROTONIX) 20 MG tablet Take 1 tablet (20 mg total) by mouth every morning. 30 minutes before breakfast 30 tablet 3  . polyethylene glycol (MIRALAX / GLYCOLAX) packet Take 17 g by mouth daily as needed for moderate constipation.     . predniSONE (DELTASONE) 2.5 MG tablet Take 1-2 tablets (2.5-5 mg total) by mouth every other day. Take 2 tablets (5mg ) on 09/23/2017, then 1 tablet (2.5mg ) on 09/24/2017, then back to home regimen of every other day. (Patient taking differently: Take 2.5 mg by mouth every other day. Take 2.5 tablets every other day) 30 tablet 0   No current facility-administered medications for this visit.     Allergies:  Allergies  Allergen Reactions  . Aspirin Other (See Comments)    ITP  . Sulfa Antibiotics Other (See Comments)    dizziness  . Contrast Media [Iodinated Diagnostic Agents] Hives  . Statins Other (See Comments)    Past Medical History, Surgical history, Social history, and Family History were reviewed and updated.  Physical Exam:  Blood pressure (!) 152/67, pulse 86, temperature (!) 97.5 F (36.4 C), temperature source Oral, resp. rate 17, height 5\' 1"  (1.549 m), weight 149 lb 1.6 oz (67.6 kg), SpO2  99 %.   ECOG: 1   General appearance: Comfortable appearing without any discomfort Head: Normocephalic without any trauma Oropharynx: Mucous membranes are moist and pink without any thrush or ulcers. Eyes: Pupils are equal and round reactive to light. Lymph nodes: No cervical, supraclavicular, inguinal or axillary lymphadenopathy.   Heart:regular rate and rhythm.  S1 and S2 without leg edema. Lung: Clear without any rhonchi or wheezes.  No dullness to percussion. Abdomin: Soft, nontender, nondistended with good bowel sounds.  No hepatosplenomegaly. Musculoskeletal: No joint deformity or effusion.  Full range of motion noted. Neurological: No deficits noted on motor, sensory and deep tendon reflex exam. Skin: No petechial rash or dryness.  Appeared moist.      Lab Results: Lab Results  Component Value Date   WBC 10.6 (H) 01/28/2018   HGB 12.8 01/28/2018   HCT 38.0 01/28/2018   MCV 83.0 01/28/2018   PLT 184.0 01/28/2018     Chemistry      Component Value Date/Time   NA 140 01/28/2018 1607   NA 142 02/16/2016 1526   K 4.2 01/28/2018 1607   K 4.3 02/16/2016 1526   CL 104 01/28/2018 1607   CL 105 04/17/2012 1452   CO2 30 01/28/2018 1607   CO2 27 02/16/2016 1526   BUN 23 01/28/2018 1607   BUN 14.3 02/16/2016 1526   CREATININE 0.92 01/28/2018 1607   CREATININE 0.8 02/16/2016 1526      Component Value Date/Time   CALCIUM 9.4 01/28/2018 1607   CALCIUM 9.5 02/16/2016 1526   ALKPHOS 73 01/28/2018 1607   ALKPHOS 92 02/16/2016 1526   AST 15 01/28/2018 1607   AST 19 02/16/2016 1526   ALT 10 01/28/2018 1607   ALT 11 02/16/2016 1526   BILITOT 0.3 01/28/2018 1607   BILITOT 0.33 02/16/2016 1526      Impression and Plan:   85 year old woman with:  1.  Chronic ITP that has been in remission without any intervention at this time.  She remains on maintenance prednisone at 2.5 mg  every other day.   The natural course of her disease as well as long-term complication  associated with prednisone was reviewed today.  He is agreeable to continue at this time.  Her platelet count remains close to normal range at this time.  Salvage therapies include higher dose of steroids, IVIG, rituximab for discussed.  These are not needed at this time given her reasonable control of platelet count.   2. Osteoporosis: No issues reported at this time.  She remains on calcium and vitamin D supplements.   3. Follow-up: Will be in 6 months.  15  minutes was spent with the patient face-to-face today.  More than 50% of time was dedicated to reviewing laboratory data, differential diagnosis and future management options.   Eli Hose, MD 2/11/20203:12 PM

## 2018-06-17 NOTE — Telephone Encounter (Signed)
Scheduled appt 02/11 los.  Printed calendar and avs.

## 2018-06-18 DIAGNOSIS — M1712 Unilateral primary osteoarthritis, left knee: Secondary | ICD-10-CM | POA: Diagnosis not present

## 2018-06-22 ENCOUNTER — Other Ambulatory Visit: Payer: Self-pay | Admitting: Cardiology

## 2018-07-01 DIAGNOSIS — H401431 Capsular glaucoma with pseudoexfoliation of lens, bilateral, mild stage: Secondary | ICD-10-CM | POA: Diagnosis not present

## 2018-07-17 ENCOUNTER — Other Ambulatory Visit: Payer: Self-pay

## 2018-07-17 DIAGNOSIS — H9193 Unspecified hearing loss, bilateral: Secondary | ICD-10-CM | POA: Diagnosis not present

## 2018-07-17 DIAGNOSIS — R040 Epistaxis: Secondary | ICD-10-CM | POA: Diagnosis not present

## 2018-07-17 DIAGNOSIS — H6123 Impacted cerumen, bilateral: Secondary | ICD-10-CM | POA: Diagnosis not present

## 2018-07-17 DIAGNOSIS — Z7289 Other problems related to lifestyle: Secondary | ICD-10-CM | POA: Diagnosis not present

## 2018-07-17 MED ORDER — LOSARTAN POTASSIUM 25 MG PO TABS: 25.0000 mg | ORAL_TABLET | Freq: Every day | ORAL | 1 refills | Status: DC

## 2018-08-18 ENCOUNTER — Other Ambulatory Visit: Payer: Self-pay | Admitting: Internal Medicine

## 2018-10-07 DIAGNOSIS — H401112 Primary open-angle glaucoma, right eye, moderate stage: Secondary | ICD-10-CM | POA: Diagnosis not present

## 2018-10-15 ENCOUNTER — Other Ambulatory Visit: Payer: Self-pay | Admitting: Internal Medicine

## 2018-10-15 DIAGNOSIS — Z1231 Encounter for screening mammogram for malignant neoplasm of breast: Secondary | ICD-10-CM

## 2018-11-19 ENCOUNTER — Other Ambulatory Visit (INDEPENDENT_AMBULATORY_CARE_PROVIDER_SITE_OTHER): Payer: Medicare HMO

## 2018-11-19 ENCOUNTER — Ambulatory Visit (INDEPENDENT_AMBULATORY_CARE_PROVIDER_SITE_OTHER): Payer: Medicare HMO | Admitting: Internal Medicine

## 2018-11-19 ENCOUNTER — Encounter: Payer: Self-pay | Admitting: Internal Medicine

## 2018-11-19 ENCOUNTER — Other Ambulatory Visit: Payer: Self-pay

## 2018-11-19 VITALS — BP 154/62 | HR 87 | Temp 97.7°F | Resp 14 | Ht 61.0 in | Wt 147.0 lb

## 2018-11-19 DIAGNOSIS — R42 Dizziness and giddiness: Secondary | ICD-10-CM | POA: Insufficient documentation

## 2018-11-19 DIAGNOSIS — I10 Essential (primary) hypertension: Secondary | ICD-10-CM

## 2018-11-19 DIAGNOSIS — R040 Epistaxis: Secondary | ICD-10-CM

## 2018-11-19 LAB — CBC WITH DIFFERENTIAL/PLATELET
Basophils Absolute: 0.1 10*3/uL (ref 0.0–0.1)
Basophils Relative: 0.9 % (ref 0.0–3.0)
Eosinophils Absolute: 0.2 10*3/uL (ref 0.0–0.7)
Eosinophils Relative: 1.8 % (ref 0.0–5.0)
HCT: 41.2 % (ref 36.0–46.0)
Hemoglobin: 13.7 g/dL (ref 12.0–15.0)
Lymphocytes Relative: 31.2 % (ref 12.0–46.0)
Lymphs Abs: 3.3 10*3/uL (ref 0.7–4.0)
MCHC: 33.2 g/dL (ref 30.0–36.0)
MCV: 83.4 fl (ref 78.0–100.0)
Monocytes Absolute: 0.8 10*3/uL (ref 0.1–1.0)
Monocytes Relative: 8 % (ref 3.0–12.0)
Neutro Abs: 6.2 10*3/uL (ref 1.4–7.7)
Neutrophils Relative %: 58.1 % (ref 43.0–77.0)
Platelets: 189 10*3/uL (ref 150.0–400.0)
RBC: 4.94 Mil/uL (ref 3.87–5.11)
RDW: 14.2 % (ref 11.5–15.5)
WBC: 10.6 10*3/uL — ABNORMAL HIGH (ref 4.0–10.5)

## 2018-11-19 LAB — COMPREHENSIVE METABOLIC PANEL
ALT: 12 U/L (ref 0–35)
AST: 18 U/L (ref 0–37)
Albumin: 4.6 g/dL (ref 3.5–5.2)
Alkaline Phosphatase: 77 U/L (ref 39–117)
BUN: 19 mg/dL (ref 6–23)
CO2: 31 mEq/L (ref 19–32)
Calcium: 9.5 mg/dL (ref 8.4–10.5)
Chloride: 102 mEq/L (ref 96–112)
Creatinine, Ser: 0.9 mg/dL (ref 0.40–1.20)
GFR: 59.2 mL/min — ABNORMAL LOW (ref >60.00)
Glucose, Bld: 99 mg/dL (ref 70–99)
Potassium: 4.6 mEq/L (ref 3.5–5.1)
Sodium: 140 mEq/L (ref 135–145)
Total Bilirubin: 0.4 mg/dL (ref 0.2–1.2)
Total Protein: 7.8 g/dL (ref 6.0–8.3)

## 2018-11-19 MED ORDER — DORZOLAMIDE HCL-TIMOLOL MAL 2-0.5 % OP SOLN: 1.0000 [drp] | Freq: Two times a day (BID) | OPHTHALMIC | 12 refills | Status: DC

## 2018-11-19 NOTE — Assessment & Plan Note (Signed)
?    Cause She tends to feel in the morning I do not think it is related to her diastolic being slightly on the lower side She does have moderate aortic stenosis which is a potential cause, but it only seems to happen in the morning that seems unrelated to that She has tried to drink more fluids We will check CBC, CMP ?  Related to eyedrops-she does have a follow-up with her ophthalmologist

## 2018-11-19 NOTE — Progress Notes (Signed)
Subjective:    Patient ID: Tamara Robertson, female    DOB: 09-Mar-1930, 85 y.o.   MRN: [id number]  HPI The patient is here for an acute visit.  Nosebleeds:  She has seen ENT.  She had a big sneeze in March and she started to have a nose bleed.  She bleeding several times in April.  She saw Dr Annalee Genta.  He placed her on saline nasal spray and saline gel, which she has been using on a daily basis.  It was fine until this morning - it bled again. She denies cause for the bleeding.   It did stop, but she was concerned that it will continue to bleed.  She was concerned about her platelet count given her history of ITP.  BP at home:  For the past two weeks her blood pressure was slightly low-just the bottom number.  She is also noticed some lightheadedness more in the morning, but she feels better in the afternoon.  Her blood pressure has been:  134/67, 121/60, 106/53, 112/52, 119/57, 145/70, 141/67.  Her blood pressure did get better today.  She has noticed that if she flexed her neck she sometimes get a very transient small right-sided headache.  The only change in medications is that she is taking different eyedrops-she has dry eyes and glaucoma and her glaucoma medication was changed and she is taking Restasis eyedrops.  She has not seen much of a change.  She does have follow-up with her eye doctor next week.   Medications and allergies reviewed with patient and updated if appropriate.  Patient Active Problem List   Diagnosis Date Noted  . 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 Rash 03/25/2018  . Chronic left-sided headaches 01/28/2018  . Depression 12/18/2017  . SBO (small bowel obstruction) (HCC) 09/20/2017  . Aortic stenosis, moderate 08/22/2017  . CAD (coronary artery disease) 08/21/2017  . Pleural effusion on right   . Streptococcal pneumonia (HCC) 06/26/2017  . Community acquired pneumonia of right lower lobe of lung (HCC) 06/25/2017  . Current chronic use of systemic steroids  06/25/2017  . Chronic lower back pain 05/14/2017  . Varicose veins of left lower extremity with complications 03/05/2017  . Bilateral hearing loss 02/01/2017  . Hypertension 07/31/2016  . Leg edema 07/31/2016  . Prediabetes 06/05/2016  . Osteopenia 12/01/2015  . Anxiety 07/19/2015  . Degenerative cervical disc 12/28/2014  . Left ovarian cyst   . Peripheral neuropathy 06/03/2012  . PVD (peripheral vascular disease) (HCC) 11/23/2010  . Bursitis of hip 10/26/2010  . Hyperlipidemia 06/27/2010  . IMMUNE THROMBOCYTOPENIC PURPURA 06/27/2010  . Unspecified glaucoma 06/27/2010  . GERD 06/27/2010  . Irritable bowel syndrome 06/27/2010  . OVERACTIVE BLADDER 06/27/2010  . OSTEOARTHRITIS, KNEE, RIGHT 06/27/2010  . URINARY INCONTINENCE 06/27/2010    Current Outpatient Medications on File Prior to Visit  Medication Sig Dispense Refill  . Biotin 2500 MCG CAPS Take 1 capsule by mouth daily.     . Calcium-Magnesium-Vitamin D (CITRACAL CALCIUM+D) 600-40-500 MG-MG-UNIT TB24 Take 1 tablet by mouth daily.     . famotidine (PEPCID) 20 MG tablet TAKE 1 TABLET BY MOUTH EVERY DAY EVERY EVENING 30 tablet 0  . Flaxseed MISC Take 5 mLs by mouth daily. Take 1 tsp daily    . FLUoxetine (PROZAC) 10 MG tablet Take 10 mg by mouth daily.    . furosemide (LASIX) 20 MG tablet TAKE 1 TABLET BY MOUTH AS NEEDED 90 tablet 2  . latanoprost (XALATAN) 0.005 % ophthalmic solution  Place 1 drop into both eyes at bedtime.    [name] losartan (COZAAR) 25 MG tablet Take 1 tablet (25 mg total) by mouth daily. 90 tablet 1  . Multiple Vitamin (MULTIVITAMIN) tablet Take 1 tablet by mouth daily.      . pantoprazole (PROTONIX) 20 MG tablet Take 1 tablet (20 mg total) by mouth every morning. 30 minutes before breakfast 30 tablet 3  . polyethylene glycol (MIRALAX / GLYCOLAX) packet Take 17 g by mouth daily as needed for moderate constipation.     . predniSONE (DELTASONE) 2.5 MG tablet Take 1-2 tablets (2.5-5 mg total) by mouth every other  day. Take 2 tablets (5mg ) on 09/23/2017, then 1 tablet (2.5mg ) on 09/24/2017, then back to home regimen of every other day. (Patient taking differently: Take 2.5 mg by mouth every other day. Take 2.5 tablets every other day) 30 tablet 0   No current facility-administered medications on file prior to visit.     Past Medical History:  Diagnosis Date  . Anxiety   . CAD (coronary artery disease)    RCA 40% stenosis cath 01/2011  . Cholelithiasis    s/p lap chole 09/2014  . Diverticular stricture [facility] 2006   [facility]  . DIVERTICULITIS, HX OF   . Diverticulosis   . DYSLIPIDEMIA   . Elevated LFTs   . GERD   . Glaucoma   . Hepatic steatosis   . HOH (hard of hearing)   . Immune thrombocytopenic purpura (HCC)    chronic - baseline 80-100K, on pred  . Irritable bowel syndrome   . Left ovarian cyst dx 01/2013 CT   working with gyn, ?malignant - elevated tumor marker OVA1  . OSTEOARTHRITIS, KNEE, RIGHT   . OSTEOPENIA   . OVERACTIVE BLADDER   . Prolapse of female bladder, acquired 11/2017  . UNSPECIFIED PERIPHERAL VASCULAR DISEASE   . URINARY INCONTINENCE     Past Surgical History:  Procedure Laterality Date  . ABDOMINAL HYSTERECTOMY  1963  . ANGIOPLASTY    . APPENDECTOMY  1956  . CARDIAC CATHETERIZATION    . CATARACT EXTRACTION, BILATERAL  10/2010  . CHOLECYSTECTOMY N/A 09/16/2014   Procedure: LAPAROSCOPIC CHOLECYSTECTOMY ;  Surgeon: Emelia Loron, MD;  Location: [facility] OR;  Service: General;  Laterality: N/A;  . KNEE ARTHROSCOPY Right   . L pop PTA  10/2009   stent  . LAPAROSCOPIC SIGMOID COLECTOMY  10/2005  . SPLENECTOMY  1954  . VARICOSE VEIN SURGERY Right 1962    Social History   Socioeconomic History  . Marital status: Widowed    Spouse name: Not on file  . Number of children: 2  . Years of education: Not on file  . Highest education level: Not on file  Occupational History  . Occupation: Retired    Comment: Programmer, multimedia  Social Needs   . Financial resource strain: Not hard at all  . Food insecurity    Worry: Never true    Inability: Never true  . Transportation needs    Medical: No    Non-medical: No  Tobacco Use  . Smoking status: Never Smoker  . Smokeless tobacco: Never Used  Substance and Sexual Activity  . Alcohol use: No    Alcohol/week: 0.0 standard drinks    Comment: rarely  . Drug use: No  . Sexual activity: Not Currently    Comment: 1st intercourse- 19, partners- 2,  widow  Lifestyle  . Physical activity    Days per week: 0  days    Minutes per session: 0 min  . Stress: Only a little  Relationships  . Social connections    Talks on phone: More than three times a week    Gets together: More than three times a week    Attends religious service: More than 4 times per year    Active member of club or organization: Yes    Attends meetings of clubs or organizations: More than 4 times per year    Relationship status: Widowed  Other Topics Concern  . 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    [place] to [place] from [place] 05/2010 to be close to kids    Family History  Problem Relation Age of Onset  . Coronary artery disease Mother   . Heart attack Mother 68  . Hyperlipidemia Mother   . Hypertension Mother   . Stomach cancer Father   . Hypertension Daughter   . Hyperlipidemia Daughter   . Arthritis Other        parent  . Transient ischemic attack Other        parent  . Colon cancer Neg Hx     Review of Systems  Constitutional: Negative for chills and fever.  HENT: Positive for nosebleeds.   Respiratory: Positive for shortness of breath (a couple of times). Negative for cough and wheezing.   Cardiovascular: Positive for leg swelling. Negative for chest pain and palpitations.  Musculoskeletal:       Occ sharp pain left upper arm  - at rest  Neurological: Positive for light-headedness (in morning) and headaches (occ).        Objective:   Vitals:   11/19/18 1457  BP: (!) 154/62  Pulse: 87  Resp: 14  Temp: 97.7 F (36.5 C)  SpO2: 97%   BP Readings from Last 3 Encounters:  11/19/18 (!) 154/62  06/17/18 (!) 152/67  05/27/18 122/70   Wt Readings from Last 3 Encounters:  11/19/18 147 lb (66.7 kg)  06/17/18 149 lb 1.6 oz (67.6 kg)  05/27/18 149 lb (67.6 kg)   Body mass index is 27.78 kg/m.   Physical Exam    Constitutional: Appears well-developed and well-nourished. No distress.  HENT:  Head: Normocephalic and atraumatic.  Neck: Neck supple. No tracheal deviation present. No thyromegaly present.  No cervical lymphadenopathy Cardiovascular: Normal rate, regular rhythm and normal heart sounds.  2/6 systolic murmur heard. No carotid bruit .  No edema Pulmonary/Chest: Effort normal and breath sounds normal. No respiratory distress. No has no wheezes. No rales.  Skin: Skin is warm and dry. Not diaphoretic.  Psychiatric: Normal mood and affect. Behavior is normal.       Assessment & Plan:    See Problem List for Assessment and Plan of chronic medical problems.

## 2018-11-19 NOTE — Assessment & Plan Note (Signed)
Started having nosebleeds in [facility] in April and was prescribed saline nasal spray and saline gel-has been using them daily No bleeding up until today-bleeding for no reason, easily stop Continue saline nasal spray and gel If bleeding recurs she will follow-up with ENT-may need to consider cauterization We will check CBC given ITP

## 2018-11-19 NOTE — Patient Instructions (Addendum)
Have blood work done.   Ask your eye doctor about the cosopt or restasis- this could be causing changes in your BP or increase your risk of nose bleeds.  Continue to monitor your BP at home.      Follow up with ENT if the nosebleeds persist.

## 2018-11-19 NOTE — Assessment & Plan Note (Signed)
Blood pressure looks good here today and has looked good at home over the past 24 hours.  For short period of time her diastolic was slightly on the low side No change in medication She will continue to monitor her blood pressure at home I do not think that the slightly lower diastolic is causing any symptoms

## 2018-11-25 DIAGNOSIS — H401132 Primary open-angle glaucoma, bilateral, moderate stage: Secondary | ICD-10-CM | POA: Diagnosis not present

## 2018-12-01 DIAGNOSIS — D693 Immune thrombocytopenic purpura: Secondary | ICD-10-CM | POA: Insufficient documentation

## 2018-12-01 DIAGNOSIS — R040 Epistaxis: Secondary | ICD-10-CM | POA: Diagnosis not present

## 2018-12-02 ENCOUNTER — Ambulatory Visit
Admission: RE | Admit: 2018-12-02 | Discharge: 2018-12-02 | Disposition: A | Discharge status: Home / Self Care | Payer: Medicare HMO | Source: Ambulatory Visit | Attending: Internal Medicine | Admitting: Internal Medicine

## 2018-12-02 ENCOUNTER — Other Ambulatory Visit: Payer: Self-pay

## 2018-12-02 DIAGNOSIS — Z1231 Encounter for screening mammogram for malignant neoplasm of breast: Secondary | ICD-10-CM

## 2018-12-16 DIAGNOSIS — R040 Epistaxis: Secondary | ICD-10-CM | POA: Diagnosis not present

## 2018-12-23 ENCOUNTER — Inpatient Hospital Stay: Payer: Medicare HMO | Admitting: Oncology

## 2018-12-23 ENCOUNTER — Other Ambulatory Visit: Payer: Self-pay

## 2018-12-23 ENCOUNTER — Inpatient Hospital Stay: Payer: Medicare HMO | Attending: Oncology

## 2018-12-23 VITALS — BP 139/67 | HR 77 | Temp 98.7°F | Resp 18 | Ht 61.0 in | Wt 145.3 lb

## 2018-12-23 DIAGNOSIS — Z7952 Long term (current) use of systemic steroids: Secondary | ICD-10-CM | POA: Insufficient documentation

## 2018-12-23 DIAGNOSIS — M818 Other osteoporosis without current pathological fracture: Secondary | ICD-10-CM | POA: Diagnosis not present

## 2018-12-23 DIAGNOSIS — D693 Immune thrombocytopenic purpura: Secondary | ICD-10-CM

## 2018-12-23 LAB — CBC WITH DIFFERENTIAL (CANCER CENTER ONLY)
Abs Immature Granulocytes: 0.02 10*3/uL (ref 0.00–0.07)
Basophils Absolute: 0.1 10*3/uL (ref 0.0–0.1)
Basophils Relative: 1 %
Eosinophils Absolute: 0.2 10*3/uL (ref 0.0–0.5)
Eosinophils Relative: 2 %
HCT: 38.4 % (ref 36.0–46.0)
Hemoglobin: 12.5 g/dL (ref 12.0–15.0)
Immature Granulocytes: 0 %
Lymphocytes Relative: 38 %
Lymphs Abs: 4.1 10*3/uL — ABNORMAL HIGH (ref 0.7–4.0)
MCH: 27.8 pg (ref 26.0–34.0)
MCHC: 32.6 g/dL (ref 30.0–36.0)
MCV: 85.5 fL (ref 80.0–100.0)
Monocytes Absolute: 1.1 10*3/uL — ABNORMAL HIGH (ref 0.1–1.0)
Monocytes Relative: 10 %
Neutro Abs: 5.4 10*3/uL (ref 1.7–7.7)
Neutrophils Relative %: 49 %
Platelet Count: 200 10*3/uL (ref 150–400)
RBC: 4.49 MIL/uL (ref 3.87–5.11)
RDW: 14.6 % (ref 11.5–15.5)
WBC Count: 10.8 10*3/uL — ABNORMAL HIGH (ref 4.0–10.5)
nRBC: 0 % (ref 0.0–0.2)

## 2018-12-23 LAB — TYPE AND SCREEN
ABO/RH(D): O POS
Antibody Screen: NEGATIVE

## 2018-12-23 NOTE — Progress Notes (Signed)
Hematology and Oncology Follow Up Visit  Sude Byers [id number] 1929/07/22 85 y.o. 12/23/2018 3:21 PM   Principle Diagnosis: 86 year old with chronic ITP is in remission since 2000.  Prior Therapy:  She is S/P splenectomy and subsequently treated with high doses of steroids. The patient have had a complete response to steroids back in the 60s and all the way have had a few relapses. Every time she has a relapse she gets restarted on high-dose of steroids and she achieved a complete response. The most recent of relapses before her move to [facility] she was hospitalized for a platelet count of 8000 around the year 2000.  Current therapy: Prednisone to 2.5 mg every other day.   Interim History: Mrs. Donnally is here for a repeat evaluation.  Since her last visit, he reports no major changes in her health.  He denies any recent bruises or hematochezia.  She did have backslash required cauterization.  He denies any recent hospitalizations or illnesses.  Her performance status remains reasonable continues to live independently and drives without any major difficulties.  He does have some visual changes decrease in her vision in her eyes but able to be our object.  She denied headaches, blurry vision, syncope or seizures.  Denies any fevers, chills or sweats.  Denied chest pain, palpitation, orthopnea or leg edema.  Denied cough, wheezing or hemoptysis.  Denied nausea, vomiting or abdominal pain.  Denies any constipation or diarrhea.  Denies any frequency urgency or hesitancy.  Denies any arthralgias or myalgias.  Denies any skin rashes or lesions.  Denies any bleeding or clotting tendency.  Denies any easy bruising.  Denies any hair or nail changes.  Denies any anxiety or depression.  Remaining review of system is negative.       Medications: Updated today on review. Current Outpatient Medications  Medication Sig Dispense Refill  . Biotin 2500 MCG CAPS Take 1 capsule by mouth daily.     .  Calcium-Magnesium-Vitamin D (CITRACAL CALCIUM+D) 600-40-500 MG-MG-UNIT TB24 Take 1 tablet by mouth daily.     . cycloSPORINE (RESTASIS) 0.05 % ophthalmic emulsion 1 drop every 12 (twelve) hours.    . dorzolamide-timolol (COSOPT) 22.3-6.8 MG/ML ophthalmic solution Place 1 drop into both eyes 2 (two) times daily. 10 mL 12  . famotidine (PEPCID) 20 MG tablet TAKE 1 TABLET BY MOUTH EVERY DAY EVERY EVENING 30 tablet 0  . Flaxseed MISC Take 5 mLs by mouth daily. Take 1 tsp daily    . FLUoxetine (PROZAC) 10 MG tablet Take 10 mg by mouth daily.    . furosemide (LASIX) 20 MG tablet TAKE 1 TABLET BY MOUTH AS NEEDED 90 tablet 2  . latanoprost (XALATAN) 0.005 % ophthalmic solution Place 1 drop into both eyes at bedtime.    [name] losartan (COZAAR) 25 MG tablet Take 1 tablet (25 mg total) by mouth daily. 90 tablet 1  . Multiple Vitamin (MULTIVITAMIN) tablet Take 1 tablet by mouth daily.      . pantoprazole (PROTONIX) 20 MG tablet Take 1 tablet (20 mg total) by mouth every morning. 30 minutes before breakfast 30 tablet 3  . polyethylene glycol (MIRALAX / GLYCOLAX) packet Take 17 g by mouth daily as needed for moderate constipation.     . predniSONE (DELTASONE) 2.5 MG tablet Take 1-2 tablets (2.5-5 mg total) by mouth every other day. Take 2 tablets (5mg ) on 09/23/2017, then 1 tablet (2.5mg ) on 09/24/2017, then back to home regimen of every other day. (Patient taking differently: Take  2.5 mg by mouth every other day. Take 2.5 tablets every other day) 30 tablet 0   No current facility-administered medications for this visit.     Allergies:  Allergies  Allergen Reactions  . Aspirin Other (See Comments)    ITP  . Sulfa Antibiotics Other (See Comments)    dizziness  . Contrast Media [Iodinated Diagnostic Agents] Hives  . Statins Other (See Comments)    Past Medical History, Surgical history, Social history, and Family History without any changes on review.  Physical Exam:  Blood pressure 139/67, pulse 77,  temperature 98.7 F (37.1 C), temperature source Oral, resp. rate 18, height 5\' 1"  (1.549 m), weight 145 lb 4.8 oz (65.9 kg), SpO2 100 %.    ECOG: 1    General appearance: Alert, awake without any distress. Head: Atraumatic without abnormalities Oropharynx: Without any thrush or ulcers. Eyes: No scleral icterus. Lymph nodes: No lymphadenopathy noted in the cervical, supraclavicular, or axillary nodes Heart:regular rate and rhythm, without any murmurs or gallops.   Lung: Clear to auscultation without any rhonchi, wheezes or dullness to percussion. Abdomin: Soft, nontender without any shifting dullness or ascites. Musculoskeletal: No clubbing or cyanosis. Neurological: No motor or sensory deficits. Skin: No rashes or lesions. Psychiatric: Mood and affect appeared normal.      Lab Results: Lab Results  Component Value Date   WBC 10.6 (H) 11/19/2018   HGB 13.7 11/19/2018   HCT 41.2 11/19/2018   MCV 83.4 11/19/2018   PLT 189.0 11/19/2018     Chemistry      Component Value Date/Time   NA 140 11/19/2018 1534   NA 142 02/16/2016 1526   K 4.6 11/19/2018 1534   K 4.3 02/16/2016 1526   CL 102 11/19/2018 1534   CL 105 04/17/2012 1452   CO2 31 11/19/2018 1534   CO2 27 02/16/2016 1526   BUN 19 11/19/2018 1534   BUN 14.3 02/16/2016 1526   CREATININE 0.90 11/19/2018 1534   CREATININE 0.8 02/16/2016 1526      Component Value Date/Time   CALCIUM 9.5 11/19/2018 1534   CALCIUM 9.5 02/16/2016 1526   ALKPHOS 77 11/19/2018 1534   ALKPHOS 92 02/16/2016 1526   AST 18 11/19/2018 1534   AST 19 02/16/2016 1526   ALT 12 11/19/2018 1534   ALT 11 02/16/2016 1526   BILITOT 0.4 11/19/2018 1534   BILITOT 0.33 02/16/2016 1526      Impression and Plan:   85 year old woman with:  1.  ITP diagnosed months at least year 2000.  She has been in remission since that time.  He has been maintained on oral prednisone since that time without any evidence of relapse.  The natural course of  this disease and the risk of relapse was assessed.  Treatment options for her disease was also reviewed including IVIG and platelets growth factors.  At this time she does not require any treatment and will continue to monitor.   2. Osteoporosis: No recent complications reported at this time.  Remains on calcium and vitamin D supplements.  3. Follow-up: In 6 months for repeat evaluation.  15  minutes was spent with the patient face-to-face today.  More than 50% of time was dedicated to reviewing her disease status, reviewing laboratory data as well as treatment options in the future.   Eli Hose, MD 8/18/20203:21 PM

## 2018-12-23 NOTE — Progress Notes (Signed)
Patient called and requested that a Type and Screen be drawn with today's labs. Ok per Dr. Alen Blew.

## 2018-12-24 ENCOUNTER — Telehealth: Payer: Self-pay

## 2018-12-24 LAB — ABO/RH: ABO/RH(D): O POS

## 2018-12-24 NOTE — Telephone Encounter (Signed)
-----   Message from Wyatt Portela, MD sent at 12/24/2018  8:19 AM EDT ----- Please let her know her blood type in O pos.

## 2018-12-24 NOTE — Telephone Encounter (Signed)
Called patient and let her know that per Dr. Alen Blew her blood type is O positive. Patient verbalized understanding.

## 2018-12-31 ENCOUNTER — Encounter: Payer: Self-pay | Admitting: Internal Medicine

## 2018-12-31 ENCOUNTER — Other Ambulatory Visit: Payer: Self-pay

## 2018-12-31 ENCOUNTER — Ambulatory Visit (INDEPENDENT_AMBULATORY_CARE_PROVIDER_SITE_OTHER): Payer: Medicare HMO | Admitting: Internal Medicine

## 2018-12-31 VITALS — BP 152/70 | HR 80 | Temp 98.1°F | Resp 16 | Ht 61.0 in | Wt 144.1 lb

## 2018-12-31 DIAGNOSIS — R040 Epistaxis: Secondary | ICD-10-CM

## 2018-12-31 DIAGNOSIS — I10 Essential (primary) hypertension: Secondary | ICD-10-CM

## 2018-12-31 DIAGNOSIS — Z23 Encounter for immunization: Secondary | ICD-10-CM

## 2018-12-31 MED ORDER — LOSARTAN POTASSIUM 25 MG PO TABS: 25.0000 mg | ORAL_TABLET | Freq: Two times a day (BID) | ORAL | 1 refills | Status: DC

## 2018-12-31 NOTE — Progress Notes (Signed)
Subjective:    Patient ID: Tamara Robertson, female    DOB: 1929/09/19, 85 y.o.   MRN: [id number]  HPI The patient is here for an acute visit.  Her daughter is with her.     Elevated blood pressure: She has been monitoring her blood pressure at home since yesterday because she was not feeling well.   It has been elevated-  8/25:  154/72, 141/73, 158/78 and today  171/81, 163/79, 158/75.  She did not monitor it prior to that because she felt ok.    Nosebleeds:  She has seen Dr Annalee Genta.  She had it cauterized 8/11. She is using the saline nasal spray and saline gel.     She had a nosebleed yesterday morning and yesterday morning - both lasted over an hour.  She applied pressure for 20 minutes and then applied apple cider vinegar and that stopped it.        Medications and allergies reviewed with patient and updated if appropriate.  Patient Active Problem List   Diagnosis Date Noted  . Lightheadedness 11/19/2018  . Epistaxis 11/19/2018  . 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 Rash 03/25/2018  . Chronic left-sided headaches 01/28/2018  . Depression 12/18/2017  . SBO (small bowel obstruction) (HCC) 09/20/2017  . Aortic stenosis, moderate 08/22/2017  . CAD (coronary artery disease) 08/21/2017  . Pleural effusion on right   . Streptococcal pneumonia (HCC) 06/26/2017  . Community acquired pneumonia of right lower lobe of lung (HCC) 06/25/2017  . Current chronic use of systemic steroids 06/25/2017  . Chronic lower back pain 05/14/2017  . Varicose veins of left lower extremity with complications 03/05/2017  . Bilateral hearing loss 02/01/2017  . Hypertension 07/31/2016  . Leg edema 07/31/2016  . Prediabetes 06/05/2016  . Osteopenia 12/01/2015  . Anxiety 07/19/2015  . Degenerative cervical disc 12/28/2014  . Left ovarian cyst   . Peripheral neuropathy 06/03/2012  . PVD (peripheral vascular disease) (HCC) 11/23/2010  . Bursitis of hip 10/26/2010  . Hyperlipidemia 06/27/2010   . IMMUNE THROMBOCYTOPENIC PURPURA 06/27/2010  . Unspecified glaucoma 06/27/2010  . GERD 06/27/2010  . Irritable bowel syndrome 06/27/2010  . OVERACTIVE BLADDER 06/27/2010  . OSTEOARTHRITIS, KNEE, RIGHT 06/27/2010  . URINARY INCONTINENCE 06/27/2010    Current Outpatient Medications on File Prior to Visit  Medication Sig Dispense Refill  . Biotin 2500 MCG CAPS Take 1 capsule by mouth daily.     . Calcium-Magnesium-Vitamin D (CITRACAL CALCIUM+D) 600-40-500 MG-MG-UNIT TB24 Take 1 tablet by mouth daily.     . cycloSPORINE (RESTASIS) 0.05 % ophthalmic emulsion 1 drop every 12 (twelve) hours.    . dorzolamide-timolol (COSOPT) 22.3-6.8 MG/ML ophthalmic solution Place 1 drop into both eyes 2 (two) times daily. 10 mL 12  . famotidine (PEPCID) 20 MG tablet TAKE 1 TABLET BY MOUTH EVERY DAY EVERY EVENING 30 tablet 0  . Flaxseed MISC Take 5 mLs by mouth daily. Take 1 tsp daily    . FLUoxetine (PROZAC) 10 MG tablet Take 10 mg by mouth daily.    . furosemide (LASIX) 20 MG tablet TAKE 1 TABLET BY MOUTH AS NEEDED 90 tablet 2  . Multiple Vitamin (MULTIVITAMIN) tablet Take 1 tablet by mouth daily.      . pantoprazole (PROTONIX) 20 MG tablet Take 1 tablet (20 mg total) by mouth every morning. 30 minutes before breakfast 30 tablet 3  . polyethylene glycol (MIRALAX / GLYCOLAX) packet Take 17 g by mouth daily as needed for moderate constipation.     [name]  predniSONE (DELTASONE) 2.5 MG tablet Take 1-2 tablets (2.5-5 mg total) by mouth every other day. Take 2 tablets (5mg ) on 09/23/2017, then 1 tablet (2.5mg ) on 09/24/2017, then back to home regimen of every other day. (Patient taking differently: Take 2.5 mg by mouth every other day. Take 2.5 tablets every other day) 30 tablet 0   No current facility-administered medications on file prior to visit.     Past Medical History:  Diagnosis Date  . Anxiety   . CAD (coronary artery disease)    RCA 40% stenosis cath 01/2011  . Cholelithiasis    s/p lap chole 09/2014  .  Diverticular stricture [facility]) 2006   [facility]  . DIVERTICULITIS, HX OF   . Diverticulosis   . DYSLIPIDEMIA   . Elevated LFTs   . GERD   . Glaucoma   . Hepatic steatosis   . HOH (hard of hearing)   . Immune thrombocytopenic purpura (HCC)    chronic - baseline 80-100K, on pred  . Irritable bowel syndrome   . Left ovarian cyst dx 01/2013 CT   working with gyn, ?malignant - elevated tumor marker OVA1  . OSTEOARTHRITIS, KNEE, RIGHT   . OSTEOPENIA   . OVERACTIVE BLADDER   . Prolapse of female bladder, acquired 11/2017  . UNSPECIFIED PERIPHERAL VASCULAR DISEASE   . URINARY INCONTINENCE     Past Surgical History:  Procedure Laterality Date  . ABDOMINAL HYSTERECTOMY  1963  . ANGIOPLASTY    . APPENDECTOMY  1956  . CARDIAC CATHETERIZATION    . CATARACT EXTRACTION, BILATERAL  10/2010  . CHOLECYSTECTOMY N/A 09/16/2014   Procedure: LAPAROSCOPIC CHOLECYSTECTOMY ;  Surgeon: Emelia Loron, MD;  Location: [facility] OR;  Service: General;  Laterality: N/A;  . KNEE ARTHROSCOPY Right   . L pop PTA  10/2009   stent  . LAPAROSCOPIC SIGMOID COLECTOMY  10/2005  . SPLENECTOMY  1954  . VARICOSE VEIN SURGERY Right 1962    Social History   Socioeconomic History  . Marital status: Widowed    Spouse name: Not on file  . Number of children: 2  . Years of education: Not on file  . Highest education level: Not on file  Occupational History  . Occupation: Retired    Comment: Programmer, multimedia  Social Needs  . Financial resource strain: Not hard at all  . Food insecurity    Worry: Never true    Inability: Never true  . Transportation needs    Medical: No    Non-medical: No  Tobacco Use  . Smoking status: Never Smoker  . Smokeless tobacco: Never Used  Substance and Sexual Activity  . Alcohol use: No    Alcohol/week: 0.0 standard drinks    Comment: rarely  . Drug use: No  . Sexual activity: Not Currently    Comment: 1st intercourse- 19, partners- 2,  widow   Lifestyle  . Physical activity    Days per week: 0 days    Minutes per session: 0 min  . Stress: Only a little  Relationships  . Social connections    Talks on phone: More than three times a week    Gets together: More than three times a week    Attends religious service: More than 4 times per year    Active member of club or organization: Yes    Attends meetings of clubs or organizations: More than 4 times per year    Relationship status: Widowed  Other Topics Concern  .  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    [place]inton Ham to [place] from [place]  05/2010 to be close to kids    Family History  Problem Relation Age of Onset  . Coronary artery disease Mother   . Heart attack Mother 27  . Hyperlipidemia Mother   . Hypertension Mother   . Stomach cancer Father   . Hypertension Daughter   . Hyperlipidemia Daughter   . Arthritis Other        parent  . Transient ischemic attack Other        parent  . Colon cancer Neg Hx     Review of Systems  Constitutional: Negative for fever.  Respiratory: Positive for shortness of breath (once in while).   Cardiovascular: Negative for chest pain and palpitations.  Musculoskeletal: Positive for gait problem.  Neurological: Positive for light-headedness (lightheadedness/dizziness) and headaches (intermittent - transient).       Objective:   Vitals:   12/31/18 1529  BP: (!) 152/70  Pulse: 80  Resp: 16  Temp: 98.1 F (36.7 C)  SpO2: 99%   BP Readings from Last 3 Encounters:  12/31/18 (!) 152/70  12/23/18 139/67  11/19/18 (!) 154/62   Wt Readings from Last 3 Encounters:  12/31/18 144 lb 1.9 oz (65.4 kg)  12/23/18 145 lb 4.8 oz (65.9 kg)  11/19/18 147 lb (66.7 kg)   Body mass index is 27.23 kg/m.   Physical Exam    Constitutional: Appears well-developed and well-nourished. No distress.  HENT:  Normocephalic and atraumatic. right nasal passageway is moist, no active  bleeding, large blood clot on medial aspect of septum Cardiovascular: Normal rate, regular rhythm and normal heart sounds.   No murmur heard. No carotid bruit .  No edema Pulmonary/Chest: Effort normal and breath sounds normal. No respiratory distress. No has no wheezes. No rales.  Skin: Skin is warm and dry. Not diaphoretic.  Psychiatric: Normal mood and affect. Behavior is normal.       Assessment & Plan:    See Problem List for Assessment and Plan of chronic medical problems.

## 2018-12-31 NOTE — Patient Instructions (Signed)
Take the losartan twice daily.   Monitor your BP and update me.    Try using Vaseline and a humidifier for your nosebleeds.

## 2018-12-31 NOTE — Assessment & Plan Note (Signed)
BP elevated here and at home for the past couple of days Increase losartan to 25 mg BID Monitor BP at least a couple of times a day She will update with her readings

## 2018-12-31 NOTE — Assessment & Plan Note (Addendum)
Recurrent nosebleeds S/p cauterization 8/11 Bleeding yesterday and today Using saline nasal spray multiple times a day and saline nasal gel Recent platelet count normal Advised using humidifier Try vaseline at night FU with ENT if needed

## 2019-01-01 ENCOUNTER — Telehealth: Payer: Self-pay | Admitting: Internal Medicine

## 2019-01-01 MED ORDER — PANTOPRAZOLE SODIUM 20 MG PO TBEC: 20.0000 mg | DELAYED_RELEASE_TABLET | ORAL | 1 refills | Status: DC

## 2019-01-01 NOTE — Telephone Encounter (Signed)
Rx sent for 90 day supply to [facility].

## 2019-01-06 ENCOUNTER — Other Ambulatory Visit: Payer: Self-pay | Admitting: Obstetrics

## 2019-01-06 ENCOUNTER — Ambulatory Visit (HOSPITAL_COMMUNITY)
Admission: RE | Admit: 2019-01-06 | Discharge: 2019-01-06 | Disposition: A | Discharge status: Home / Self Care | Payer: Medicare HMO | Source: Ambulatory Visit | Attending: Obstetrics | Admitting: Obstetrics

## 2019-01-06 ENCOUNTER — Other Ambulatory Visit: Payer: Self-pay

## 2019-01-06 DIAGNOSIS — N83292 Other ovarian cyst, left side: Secondary | ICD-10-CM | POA: Diagnosis not present

## 2019-01-06 DIAGNOSIS — N83202 Unspecified ovarian cyst, left side: Secondary | ICD-10-CM | POA: Diagnosis not present

## 2019-01-09 ENCOUNTER — Encounter: Payer: Self-pay | Admitting: Internal Medicine

## 2019-01-11 NOTE — Progress Notes (Signed)
Consult Note: Gyn-Onc  Tamara Robertson 85 y.o. female  CC:  Chief Complaint  Patient presents with  . Follow-up    HPI: Patient is an 85 year old gravida 3 para 2 aborta 1 who noticed a change in her bowels and that she has a narrow bowel movements about every 3 weeks and then her bowels have returned to normal. She was also experiencing some right lower quadrant pain. The patient states she has a history of spastic colon and IBS. However she cannot tolerate bowel prep for colonoscopy she underwent a CT scan of the abdomen and pelvis which revealed:  CT ABDOMEN AND PELVIS WITH CONTRAST   Lung bases are clear.  8 mm hypervascular lesion in the inferior right hepatic lobe (series 2/ image 35), likely reflecting a flash filling hemangioma. Prior splenectomy. Pancreas and adrenal glands are within normal limits. Gallbladder is unremarkable. No intrahepatic or extrahepatic ductal dilatation. Right renal cysts, including a 2.7 cm anterior upper pole cyst (series 5/ image 16) and a 1.5 cm posterior lower pole cyst (series 5/ image 7). Left kidney is within normal limits. No hydronephrosis. No evidence of bowel obstruction. Appendix is not discretely visualized and may be surgically absent. Prior sigmoid resection with anastomosis in the left pelvis (series 2/image 68). Atherosclerotic calcifications of the abdominal aorta and branch vessels.  No abdominopelvic ascites.  No suspicious abdominopelvic lymphadenopathy.  Status post hysterectomy. 2.8 cm left ovarian cyst (series 2/ image 61). No right adnexal mass. Mild prominence of the mid right ureter (series 2/image 53), although smoothly tapering distally, favored to reflect physiologic laxity.  Bladder is within normal limits. Degenerative changes of the visualized thoracolumbar spine.  IMPRESSION:  No evidence of bowel obstruction.  Prior splenectomy, sigmoidectomy, and hysterectomy. Suspected prior appendectomy. No CT findings to account for  the patient's right lower quadrant abdominal pain. 8 mm hypervascular lesion in the inferior right hepatic lobe, likely reflecting a flash filling hemangioma. 2.8 cm left ovarian cyst. Given postmenopausal status, follow-up pelvic ultrasound is suggested in 1 year.  The CT findings prompted an ultrasound and Dr. Assunta Curtis office. This was performed on October 14. 2 small cysts were noted within the region of the left adnexa. There was a 2.9 x 2.7 x 2.0 cm cyst with soft tissue nodule measuring 1.3 x 0.6 x 1.1 cm within the cyst. There's a separate small cysts measuring 1.8 x 1.8 x 1.5 cm simple in appearance. There is no increased vascularity noted. A CA 125 was performed that was normal at 23. However, an OVA-1 test was performed that was slightly elevated at 4.7 with the upper limit of normal for a menopausal woman being 4.4.  Ultrasound 05/25/13: Left ovary  Measurements: 3.5 x 2.4 x 3.3 cm. Two cyst within the left ovary measuring 2.2 x 1.4 x 2.0 cm and 2.3 x 2.3 x 2.5 cm. These either represent 2 adjacent simple cyst with the measurements as above or 1  larger cyst measuring up to 3.5 cm with a thin internal septation. Other findings No free fluid.  IMPRESSION:  Either two adjacent simple cysts or one larger cyst with a thin internal septation as described above. This is almost certainly benign, but follow up ultrasound is recommended in 1 year according to the Society of Radiologists in [place] Statement (D Clovis Riley et al. Management of Asymptomatic Ovarian and Other Adnexal Cysts Imaged at [place]: Society of Radiologists in [place] Statement 2010. Radiology 256 (Sept 2010): 943-954.).  A CA 125  was also drawn that was normal at 13.8. This is down from 21 on 02/17/2013. She comes in today for followup. She is very pleased with the results of the ultrasound as well as the blood work. She's been completely asymptomatic and has no complaints related to  this.  Interval History: I last saw her in March 2018. At that time: 4.1 cm indeterminate but probably benign cystic lesion in left adnexa, without significant change compared to previous studies dating back to 04/06/2015.  Ultrasound 01/2019: Measurements: 7.0 x 3.3 x 5.1 cm = volume: 62.6 mL. Large hypoechoic nodule filling LEFT ovary, question ovarian cyst, increased in size since prior study when it measured 4.2 cm greatest size. No definite solid component is seen though this is suboptimally characterized due to bowel and depth of lesion.  As we talked about her scan she clearly did not have a transvaginal Though it does state that it was done.  She wonders if that might have led to some of the discrepancy in the measurement size.  She also states that the technician felt a little bit nervous doing her ultrasound and she is not sure if there was some issues with that as well.  After we discussed the option she would feel much more comfortable getting an MRI as would be less technician variability.  She might feel a little bit more bloated.  She states that 2 to 3 days ago she had a little dull pain that went away that has not necessarily changed and continues to be brief.  She alternates between diarrhea and constipation.  She have diarrhea for a day take Lomotil and then she gets constipated, she will take a stool softener and that she has diarrhea and that is been a cycle for a long time and similarly has not changed.  She denies any early satiety.  She has no nausea vomiting.  Otherwise she is been in her usual state of health.  She is of course somewhat stressed and distressed regarding the results of the ultrasound.  She states she wishes she would have had surgery many years ago and this was initially diagnosed.  She wants me to look at her right ear.  Review of Systems: Constitutional: Denies fever. Skin: Painful right pinna Cardiovascular: No chest pain, shortness of breath, slight edema in  her left ankle Pulmonary: No cough Gastro Intestinal:  No nausea, vomiting, bowel habits as above. Genitourinary: Denies vaginal bleeding and discharge.  Neurologic: No weakness, numbness, or change in gait.  Psychology: Some stress did not elaborate  Current Meds:  Outpatient Encounter Medications as of 01/14/2019  Medication Sig  . Biotin 2500 MCG CAPS Take 1 capsule by mouth daily.   . Calcium-Magnesium-Vitamin D (CITRACAL CALCIUM+D) 600-40-500 MG-MG-UNIT TB24 Take 1 tablet by mouth daily.   . cycloSPORINE (RESTASIS) 0.05 % ophthalmic emulsion 1 drop every 12 (twelve) hours.  . dorzolamide-timolol (COSOPT) 22.3-6.8 MG/ML ophthalmic solution Place 1 drop into both eyes 2 (two) times daily.  . famotidine (PEPCID) 20 MG tablet TAKE 1 TABLET BY MOUTH EVERY DAY EVERY EVENING  . Flaxseed MISC Take 5 mLs by mouth daily. Take 1 tsp daily  . FLUoxetine (PROZAC) 10 MG tablet Take 10 mg by mouth daily.  . furosemide (LASIX) 20 MG tablet TAKE 1 TABLET BY MOUTH AS NEEDED  . losartan (COZAAR) 25 MG tablet Take 1 tablet (25 mg total) by mouth 2 (two) times daily.  . Multiple Vitamin (MULTIVITAMIN) tablet Take 1 tablet by mouth daily.    [name]  pantoprazole (PROTONIX) 20 MG tablet Take 1 tablet (20 mg total) by mouth every morning. 30 minutes before breakfast  . polyethylene glycol (MIRALAX / GLYCOLAX) packet Take 17 g by mouth daily as needed for moderate constipation.   . predniSONE (DELTASONE) 2.5 MG tablet Take 1-2 tablets (2.5-5 mg total) by mouth every other day. Take 2 tablets (5mg ) on 09/23/2017, then 1 tablet (2.5mg ) on 09/24/2017, then back to home regimen of every other day. (Patient taking differently: Take 2.5 mg by mouth every other day. Take 2.5 tablets every other day)   No facility-administered encounter medications on file as of 01/14/2019.     Allergy:  Allergies  Allergen Reactions  . Aspirin Other (See Comments)    ITP  . Sulfa Antibiotics Other (See Comments)    dizziness  . Contrast  Media [Iodinated Diagnostic Agents] Hives  . Statins Other (See Comments)    Social Hx:   Social History   Socioeconomic History  . Marital status: Widowed    Spouse name: Not on file  . Number of children: 2  . Years of education: Not on file  . Highest education level: Not on file  Occupational History  . Occupation: Retired    Comment: Retail banker  Social Needs  . Financial resource strain: Not hard at all  . Food insecurity    Worry: Never true    Inability: Never true  . Transportation needs    Medical: No    Non-medical: No  Tobacco Use  . Smoking status: Never Smoker  . Smokeless tobacco: Never Used  Substance and Sexual Activity  . Alcohol use: No    Alcohol/week: 0.0 standard drinks    Comment: rarely  . Drug use: No  . Sexual activity: Not Currently    Comment: 1st intercourse- 19, partners- 2,  widow  Lifestyle  . Physical activity    Days per week: 0 days    Minutes per session: 0 min  . Stress: Only a little  Relationships  . Social connections    Talks on phone: More than three times a week    Gets together: More than three times a week    Attends religious service: More than 4 times per year    Active member of club or organization: Yes    Attends meetings of clubs or organizations: More than 4 times per year    Relationship status: Widowed  . Intimate partner violence    Fear of current or ex partner: Not on file    Emotionally abused: Not on file    Physically abused: Not on file    Forced sexual activity: Not on file  Other Topics Concern  . 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    [place]orie Rank to [place] from [place] 05/2010 to be close to kids    Past Surgical Hx:  Past Surgical History:  Procedure Laterality Date  . ABDOMINAL HYSTERECTOMY  1963  . ANGIOPLASTY    . APPENDECTOMY  1956  . CARDIAC CATHETERIZATION    . CATARACT EXTRACTION, BILATERAL  10/2010  .  CHOLECYSTECTOMY N/A 09/16/2014   Procedure: LAPAROSCOPIC CHOLECYSTECTOMY ;  Surgeon: Rolm Bookbinder, MD;  Location: [facility];  Service: General;  Laterality: N/A;  . KNEE ARTHROSCOPY Right   . L pop PTA  10/2009   stent  . LAPAROSCOPIC SIGMOID COLECTOMY  10/2005  . SPLENECTOMY  1954  . VARICOSE VEIN SURGERY Right  87    Past Medical Hx:  Past Medical History:  Diagnosis Date  . Anxiety   . CAD (coronary artery disease)    RCA 40% stenosis cath 01/2011  . Cholelithiasis    s/p lap chole 09/2014  . Diverticular stricture [facility]) 2006   [facility]  . DIVERTICULITIS, HX OF   . Diverticulosis   . DYSLIPIDEMIA   . Elevated LFTs   . GERD   . Glaucoma   . Hepatic steatosis   . HOH (hard of hearing)   . Immune thrombocytopenic purpura (HCC)    chronic - baseline 80-100K, on pred  . Irritable bowel syndrome   . Left ovarian cyst dx 01/2013 CT   working with gyn, ?malignant - elevated tumor marker OVA1  . OSTEOARTHRITIS, KNEE, RIGHT   . OSTEOPENIA   . OVERACTIVE BLADDER   . Prolapse of female bladder, acquired 11/2017  . UNSPECIFIED PERIPHERAL VASCULAR DISEASE   . URINARY INCONTINENCE     Oncology Hx:  Oncology History   No history exists.    Family Hx:  Family History  Problem Relation Age of Onset  . Coronary artery disease Mother   . Heart attack Mother 89  . Hyperlipidemia Mother   . Hypertension Mother   . Stomach cancer Father   . Hypertension Daughter   . Hyperlipidemia Daughter   . Arthritis Other        parent  . Transient ischemic attack Other        parent  . Colon cancer Neg Hx     Vitals:  Blood pressure (!) 148/63, pulse 73, temperature 98.2 F (36.8 C), temperature source Tympanic, resp. rate 18, height 5\' 1"  (1.549 m), weight 144 lb (65.3 kg).  Physical Exam: Well-nourished well-developed female in no acute distress.  HEENT: Right ear with a little bit of a darker brownish skin area on the pinna with a little bit of skin flaking.   One small little pimple on the inside of the ear.  No obvious seborrheic keratoses, actinic keratoses or skin cancer noted.  Neck: Supple, no lymphadenopathy, no thyromegaly.  Abdomen: Well-healed vertical midline incision. Abdomen is soft. It is tender to deep palpation in the left lower quadrant but she believes this is secondary to her bowels. There is no rebound or guarding. There is no hepatosplenomegaly. There is no fluid wave  Groins: No lymphadenopathy.  Extremities: Trace edema bilaterally.  Pelvic: External genitalia normal. Bimanual examination does not reveal a distinct mass.  You can get the sense of a fullness in the left lower quadrant.  However, I did not know there was a mass there I would not of appreciated it.  Rectal confirms   Assessment/Plan: 85 year old gravida 3 para 2 status post hysterectomy and right salpingo-oophorectomy at the age of 65 or 60 for advanced endometriosis. She currently has a complex left adnexal mass measuring about 7 cm; however, this was on abdominal ultrasound and the patient is not sure if there might of been some technical difficulties or issues.  She would like to move forward with an MRI.  We will get the MRI scheduled and she knows that I will call her with the results.  Greater than 20 minutes face-to-face time was spent with the patient discussing all of this of which greater than 50% was in consultation discussion regarding her prior ultrasound appointment.   Luigi Stuckey A., MD 01/14/2019, 1:25 PM

## 2019-01-12 ENCOUNTER — Encounter: Payer: Self-pay | Admitting: Internal Medicine

## 2019-01-14 ENCOUNTER — Other Ambulatory Visit: Payer: Medicare HMO

## 2019-01-14 ENCOUNTER — Other Ambulatory Visit: Payer: Self-pay

## 2019-01-14 ENCOUNTER — Inpatient Hospital Stay: Payer: Medicare HMO | Attending: Gynecologic Oncology | Admitting: Gynecologic Oncology

## 2019-01-14 DIAGNOSIS — N800 Endometriosis of uterus: Secondary | ICD-10-CM

## 2019-01-14 DIAGNOSIS — Z90722 Acquired absence of ovaries, bilateral: Secondary | ICD-10-CM | POA: Diagnosis not present

## 2019-01-14 DIAGNOSIS — Z90710 Acquired absence of both cervix and uterus: Secondary | ICD-10-CM | POA: Diagnosis not present

## 2019-01-14 DIAGNOSIS — N949 Unspecified condition associated with female genital organs and menstrual cycle: Secondary | ICD-10-CM

## 2019-01-14 NOTE — Patient Instructions (Signed)
I will call you with the results of your MRI. 

## 2019-01-20 ENCOUNTER — Telehealth: Payer: Self-pay | Admitting: Gynecologic Oncology

## 2019-01-20 ENCOUNTER — Telehealth: Payer: Self-pay | Admitting: *Deleted

## 2019-01-20 NOTE — Telephone Encounter (Signed)
Returned call to patient. Left message asking her to please call the office back at her earliest convenience.

## 2019-01-20 NOTE — Telephone Encounter (Signed)
Patient called and wanted to speak with Melissa APP. Explained that Cicero APP was in the OR,. Patient wants to talk with her regarding the MRI scan and Dr. Alycia Rossetti. Patient would not go into detail with me.

## 2019-01-21 ENCOUNTER — Other Ambulatory Visit: Payer: Self-pay | Admitting: Gynecologic Oncology

## 2019-01-21 ENCOUNTER — Encounter: Payer: Self-pay | Admitting: Internal Medicine

## 2019-01-21 ENCOUNTER — Telehealth: Payer: Self-pay | Admitting: Gynecologic Oncology

## 2019-01-21 DIAGNOSIS — N949 Unspecified condition associated with female genital organs and menstrual cycle: Secondary | ICD-10-CM

## 2019-01-21 NOTE — Telephone Encounter (Signed)
Left message for patient as follow up to our previous conversation.  Advised her that Dr. Alycia Rossetti said we could change the MRI to without contrast.  The pt is able to keep the same date and time of her prev scheduled appt.

## 2019-01-21 NOTE — Progress Notes (Signed)
Order changed to MRI without contrast.  Patient does not want contrast. States she has had severe diarrhea in the past and currently has intermittent diarrhea. Per Dr. Alycia Rossetti, proceed with MRI pelvis without.

## 2019-01-21 NOTE — Telephone Encounter (Signed)
Patient does not want to proceed with the MRI. She thinks the contrast will give her diarrhea. She was wanting to know would be ok with attempting to get a transvaginal ultrasound (since not done at recent US) or MR without contrast. Message sent to Dr. Alycia Rossetti.  Patient advised she would receive a return call with recommendations when available.

## 2019-01-22 ENCOUNTER — Telehealth: Payer: Self-pay | Admitting: *Deleted

## 2019-01-22 MED ORDER — LOSARTAN POTASSIUM 25 MG PO TABS: 25.0000 mg | ORAL_TABLET | Freq: Two times a day (BID) | ORAL | 1 refills | Status: DC

## 2019-01-22 NOTE — Telephone Encounter (Signed)
Pt call back needing Losartan sent to [facility]. Notified pt rx has been sent...Tamara Robertson

## 2019-01-25 ENCOUNTER — Other Ambulatory Visit: Payer: Self-pay

## 2019-01-25 ENCOUNTER — Ambulatory Visit
Admission: RE | Admit: 2019-01-25 | Discharge: 2019-01-25 | Disposition: A | Discharge status: Home / Self Care | Payer: Medicare HMO | Source: Ambulatory Visit | Attending: Gynecologic Oncology | Admitting: Gynecologic Oncology

## 2019-01-25 ENCOUNTER — Other Ambulatory Visit: Payer: Medicare HMO

## 2019-01-25 DIAGNOSIS — N83202 Unspecified ovarian cyst, left side: Secondary | ICD-10-CM | POA: Diagnosis not present

## 2019-01-25 DIAGNOSIS — N949 Unspecified condition associated with female genital organs and menstrual cycle: Secondary | ICD-10-CM

## 2019-01-26 ENCOUNTER — Telehealth: Payer: Self-pay

## 2019-01-26 NOTE — Telephone Encounter (Signed)
I let Tamara Robertson know that the MRI showed the left ovarian cyst measured 3.6 x 4.2 x 3.3 cm.  I told her Dr. Alycia Rossetti said she can have an annual MRI ir she prefers. She is to follow up with this office in 1 year. The patient verbalized understanding.

## 2019-01-29 DIAGNOSIS — H6123 Impacted cerumen, bilateral: Secondary | ICD-10-CM | POA: Diagnosis not present

## 2019-01-30 DIAGNOSIS — H903 Sensorineural hearing loss, bilateral: Secondary | ICD-10-CM | POA: Insufficient documentation

## 2019-02-03 DIAGNOSIS — L570 Actinic keratosis: Secondary | ICD-10-CM | POA: Diagnosis not present

## 2019-02-03 DIAGNOSIS — D225 Melanocytic nevi of trunk: Secondary | ICD-10-CM | POA: Diagnosis not present

## 2019-02-03 DIAGNOSIS — L814 Other melanin hyperpigmentation: Secondary | ICD-10-CM | POA: Diagnosis not present

## 2019-02-03 DIAGNOSIS — L820 Inflamed seborrheic keratosis: Secondary | ICD-10-CM | POA: Diagnosis not present

## 2019-02-03 DIAGNOSIS — L821 Other seborrheic keratosis: Secondary | ICD-10-CM | POA: Diagnosis not present

## 2019-02-03 DIAGNOSIS — Z23 Encounter for immunization: Secondary | ICD-10-CM | POA: Diagnosis not present

## 2019-02-06 ENCOUNTER — Ambulatory Visit: Payer: Medicare HMO

## 2019-02-09 ENCOUNTER — Other Ambulatory Visit: Payer: Self-pay | Admitting: Internal Medicine

## 2019-02-11 ENCOUNTER — Ambulatory Visit (INDEPENDENT_AMBULATORY_CARE_PROVIDER_SITE_OTHER): Payer: Medicare HMO | Admitting: Internal Medicine

## 2019-02-11 ENCOUNTER — Other Ambulatory Visit (INDEPENDENT_AMBULATORY_CARE_PROVIDER_SITE_OTHER): Payer: Medicare HMO

## 2019-02-11 ENCOUNTER — Encounter: Payer: Self-pay | Admitting: Internal Medicine

## 2019-02-11 VITALS — BP 130/64 | HR 84 | Temp 97.6°F | Ht 61.0 in | Wt 145.4 lb

## 2019-02-11 DIAGNOSIS — R197 Diarrhea, unspecified: Secondary | ICD-10-CM

## 2019-02-11 DIAGNOSIS — K219 Gastro-esophageal reflux disease without esophagitis: Secondary | ICD-10-CM

## 2019-02-11 DIAGNOSIS — R110 Nausea: Secondary | ICD-10-CM | POA: Diagnosis not present

## 2019-02-11 DIAGNOSIS — K582 Mixed irritable bowel syndrome: Secondary | ICD-10-CM

## 2019-02-11 DIAGNOSIS — R12 Heartburn: Secondary | ICD-10-CM

## 2019-02-11 DIAGNOSIS — R42 Dizziness and giddiness: Secondary | ICD-10-CM

## 2019-02-11 LAB — IBC PANEL
Iron: 60 ug/dL (ref 42–145)
Saturation Ratios: 15.6 % — ABNORMAL LOW (ref 20.0–50.0)
Transferrin: 275 mg/dL (ref 212.0–360.0)

## 2019-02-11 LAB — VITAMIN B12: Vitamin B-12: 472 pg/mL (ref 211–911)

## 2019-02-11 LAB — TSH: TSH: 2.23 u[IU]/mL (ref 0.35–4.50)

## 2019-02-11 LAB — FOLATE: Folate: >24.1 ng/mL (ref >5.9)

## 2019-02-11 NOTE — Patient Instructions (Signed)
Discontinue pantoprazole.  Please purchase the following medications over the counter and take as directed: Tums OR Gaviscon liquid as needed for heartburn/indigestion  Change the time you take Miralax to in the mornings.  Your provider has requested that you go to the basement level for lab work before leaving today. Press "B" on the elevator. The lab is located at the first door on the left as you exit the elevator.  Please follow up with Dr Hilarie Fredrickson in December 2020.  If you are age 50 or older, your body mass index should be between 23-30. Your Body mass index is 27.47 kg/m. If this is out of the aforementioned range listed, please consider follow up with your Primary Care Provider.  If you are age 70 or younger, your body mass index should be between 19-25. Your Body mass index is 27.47 kg/m. If this is out of the aformentioned range listed, please consider follow up with your Primary Care Provider.

## 2019-02-12 ENCOUNTER — Encounter: Payer: Self-pay | Admitting: Internal Medicine

## 2019-02-12 ENCOUNTER — Other Ambulatory Visit (INDEPENDENT_AMBULATORY_CARE_PROVIDER_SITE_OTHER): Payer: Medicare HMO

## 2019-02-12 ENCOUNTER — Telehealth: Payer: Self-pay | Admitting: Internal Medicine

## 2019-02-12 DIAGNOSIS — R197 Diarrhea, unspecified: Secondary | ICD-10-CM | POA: Diagnosis not present

## 2019-02-12 DIAGNOSIS — K219 Gastro-esophageal reflux disease without esophagitis: Secondary | ICD-10-CM | POA: Diagnosis not present

## 2019-02-12 DIAGNOSIS — R12 Heartburn: Secondary | ICD-10-CM

## 2019-02-12 LAB — FERRITIN: Ferritin: 25.7 ng/mL (ref 10.0–291.0)

## 2019-02-12 NOTE — Telephone Encounter (Signed)
See lab results dated 02/12/19.

## 2019-02-12 NOTE — Progress Notes (Signed)
Subjective:    Patient ID: Tamara Robertson, female    DOB: 1929-07-06, 85 y.o.   MRN: [id number]  HPI Tamara Robertson is an 85 yo female with PMH of GERD, IBS with constipation, history of complicated diverticular disease status post sigmoidectomy in 2007 for sigmoid stricture, history of small bowel obstruction felt secondary to adhesive disease, peripheral vascular disease, ITP with prior cholecystectomy and splenectomy who is here for follow-up.  She is here alone today and I last saw her in January 2020.  She reports several different GI issues and complaints today.  She also is still having dizziness and swimmy head.  She reports that she will have off and on issues with looser stools.  She felt like she was having diarrhea about 3 days/week but now it is only 1 or 2 days/week.  Occasionally she will use Imodium which will then make her feel constipated for 3 days.  She does have some associated flatulence and bloating symptom.  She still occasionally has epigastric pain but this comes and goes and reports it is "not often".  She feels nausea after taking her MiraLAX.  She has been using MiraLAX in the evening.  She will occasionally feel pain under her bra line but this has improved since she switched from an underwire bra to a soft more sports type bra.  She has not seen blood in her stool or melena.  Her appetite is been pretty good and she is gained about 5 pounds.  She did recently have a pelvic MRI to follow-up on a cyst in her left ovary.  She was told this was stable and likely benign.   Review of Systems As per HPI, otherwise negative  Current Medications, Allergies, Past Medical History, Past Surgical History, Family History and Social History were reviewed in [employer] record.     Objective:   Physical Exam BP 130/64 (BP Location: Left Arm, Patient Position: Sitting, Cuff Size: Normal)   Pulse 84   Temp 97.6 F (36.4 C) (Oral)   Ht 5\' 1"  (1.549 m)    Wt 145 lb 6 oz (65.9 kg)   BMI 27.47 kg/m  Gen: awake, alert, NAD HEENT: anicteric, op clear CV: RRR, no mrg Pulm: CTA b/l Abd: soft, NT/ND, +BS throughout Ext: no c/c/e Neuro: nonfocal  CBC    Component Value Date/Time   WBC 10.8 (H) 12/23/2018 1512   WBC 10.6 (H) 11/19/2018 1534   RBC 4.49 12/23/2018 1512   HGB 12.5 12/23/2018 1512   HGB 13.9 04/17/2017 1301   HCT 38.4 12/23/2018 1512   HCT 42.2 04/17/2017 1301   PLT 200 12/23/2018 1512   PLT 206 04/17/2017 1301   MCV 85.5 12/23/2018 1512   MCV 86.7 04/17/2017 1301   MCH 27.8 12/23/2018 1512   MCHC 32.6 12/23/2018 1512   RDW 14.6 12/23/2018 1512   RDW 14.3 04/17/2017 1301   LYMPHSABS 4.1 (H) 12/23/2018 1512   LYMPHSABS 3.5 (H) 04/17/2017 1301   MONOABS 1.1 (H) 12/23/2018 1512   MONOABS 1.2 (H) 04/17/2017 1301   EOSABS 0.2 12/23/2018 1512   EOSABS 0.2 04/17/2017 1301   BASOSABS 0.1 12/23/2018 1512   BASOSABS 0.0 04/17/2017 1301   CMP     Component Value Date/Time   NA 140 11/19/2018 1534   NA 142 02/16/2016 1526   K 4.6 11/19/2018 1534   K 4.3 02/16/2016 1526   CL 102 11/19/2018 1534   CL 105 04/17/2012 1452   CO2 31  11/19/2018 1534   CO2 27 02/16/2016 1526   GLUCOSE 99 11/19/2018 1534   GLUCOSE 184 (H) 02/16/2016 1526   GLUCOSE 122 (H) 04/17/2012 1452   BUN 19 11/19/2018 1534   BUN 14.3 02/16/2016 1526   CREATININE 0.90 11/19/2018 1534   CREATININE 0.8 02/16/2016 1526   CALCIUM 9.5 11/19/2018 1534   CALCIUM 9.5 02/16/2016 1526   PROT 7.8 11/19/2018 1534   PROT 7.4 02/16/2016 1526   ALBUMIN 4.6 11/19/2018 1534   ALBUMIN 3.7 02/16/2016 1526   AST 18 11/19/2018 1534   AST 19 02/16/2016 1526   ALT 12 11/19/2018 1534   ALT 11 02/16/2016 1526   ALKPHOS 77 11/19/2018 1534   ALKPHOS 92 02/16/2016 1526   BILITOT 0.4 11/19/2018 1534   BILITOT 0.33 02/16/2016 1526   GFRNONAA >60 09/22/2017 0416   GFRAA >60 09/22/2017 0416   MRI PELVIS WITHOUT CONTRAST   TECHNIQUE: Multiplanar multisequence MR imaging  of the pelvis was performed. No intravenous contrast was administered.   COMPARISON:  Pelvic ultrasound dated 01/06/2019. CT abdomen/pelvis dated 09/21/2015.   FINDINGS: Urinary Tract: Bladder is notable for a cystocele at rest (series 3/image 15).   Bowel: Visualized bowel is unremarkable. Known prior left hemicolectomy, better evaluated on CT.   Vascular/Lymphatic: No evidence of aneurysm.   No suspicious pelvic lymphadenopathy.   Reproductive: Status post hysterectomy. Right ovary is not discretely visualized.   3.6 x 4.2 x 3.3 cm predominantly simple left ovarian cystic lesion (series 5/image 8), with mildly irregular margins and incomplete septations along the inferolateral margin of the lesion (series 5/image 9), but without solid component/mural nodularity. Lesion measured 2.6 x 3.0 x 2.7 cm on prior 2014 CT, indicating mild growth, but favoring a benign etiology.   Other:  No pelvic ascites.   Musculoskeletal: Mild degenerative changes of the lower lumbar spine.   IMPRESSION: 4.2 cm predominantly simple left ovarian cystic lesion, without overt malignant features. Given only mild growth since 2014, this continues to favor a benign etiology. Generally speaking, annual follow-up ultrasound is suggested in a postmenopausal patient. However, given the patient's age, it is unclear that follow-up is clinically warranted.   Cystocele at rest, suggesting global pelvic floor dysfunction status post hysterectomy.     Electronically Signed   By: Charline Bills M.D.   On: 01/25/2019 15:28        Assessment & Plan:  84 yo female with PMH of GERD, IBS with constipation, history of complicated diverticular disease status post sigmoidectomy in 2007 for sigmoid stricture, history of small bowel obstruction felt secondary to adhesive disease, peripheral vascular disease, ITP with prior cholecystectomy and splenectomy who is here for follow-up.   1.   Dizziness/nausea/loose stool --this could possibly be a side effect from pantoprazole therapy.  I am going to stop pantoprazole and observe for improvement.  Also check a B12, folate and iron studies.  Check TSH  2.  GERD/epigastric pain --prior CT scan for this symptom unremarkable.  This is intermittent in nature.  I am going to have her use Tums or Gaviscon over-the-counter as needed per box instruction for breakthrough heartburn.  After pantoprazole is stopped if she has clearly worsening GERD or dyspepsia she is asked to notify me.  She voices understanding  3.  Chronic constipation with IBS --we are continuing MiraLAX but I would like her to move this to the morning.  I think some of her diarrhea could be an overflow type phenomenon.  It seems to be better  for now and we will monitor.  I would like to see her back in about 2 months for follow-up  25 minutes spent with the patient today. Greater than 50% was spent in counseling and coordination of care with the patient

## 2019-02-15 IMAGING — CT CT HEAD W/O CM
3 of 6 series · 16 of 47 positions shown, 19 images · non-contrast
Comparison: CT scan of March 10, 2015.

CLINICAL DATA: Possible stroke.

EXAM:
CT HEAD WITHOUT CONTRAST
TECHNIQUE: Contiguous axial images were obtained from the base of the skull
through the vertex without intravenous contrast.

[Series 2: head wo · axial · 0.47mm/px · z∈[+1530,+1635]mm · 11 of 25 slices shown, 14 images]
[im 2/25  brain]
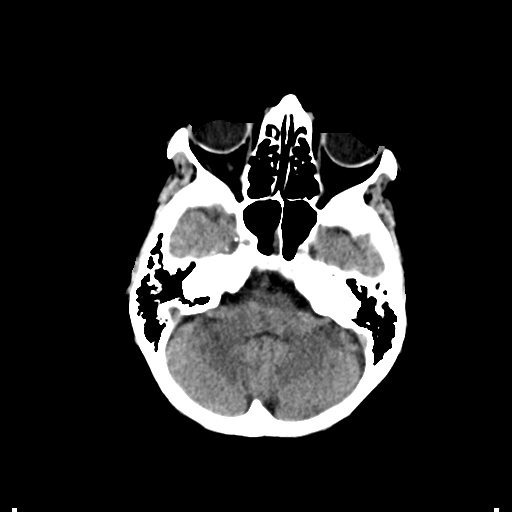
[im 2/25  bone]
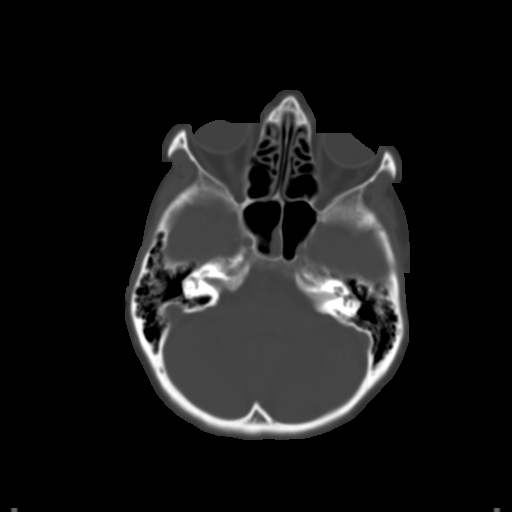
[im 4/25  brain]
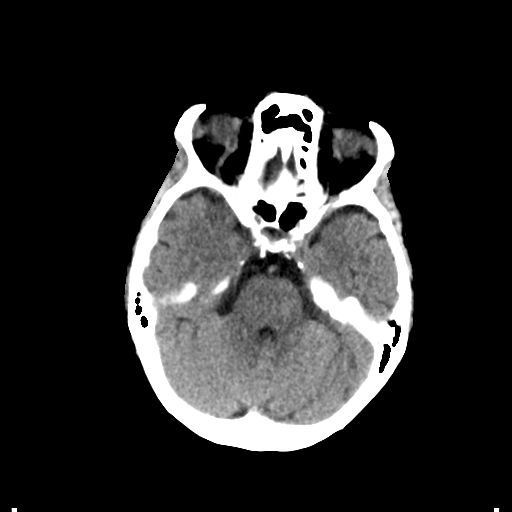
[im 7/25  brain]
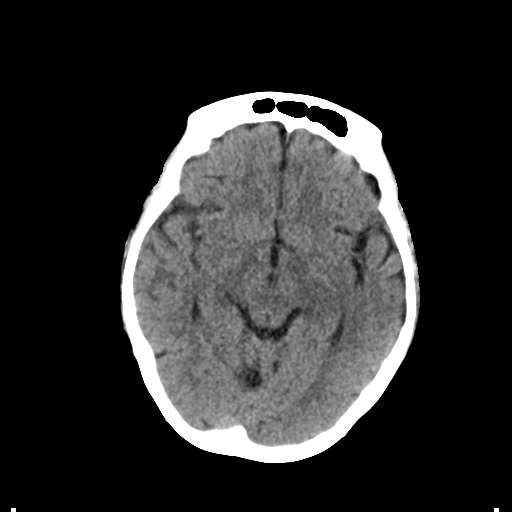
[im 8/25  brain]
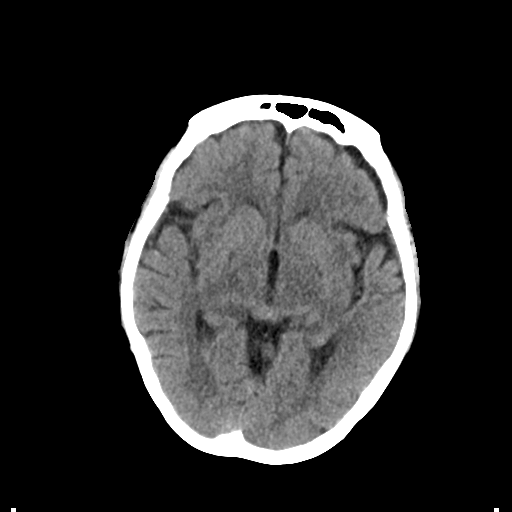
[im 11/25  brain]
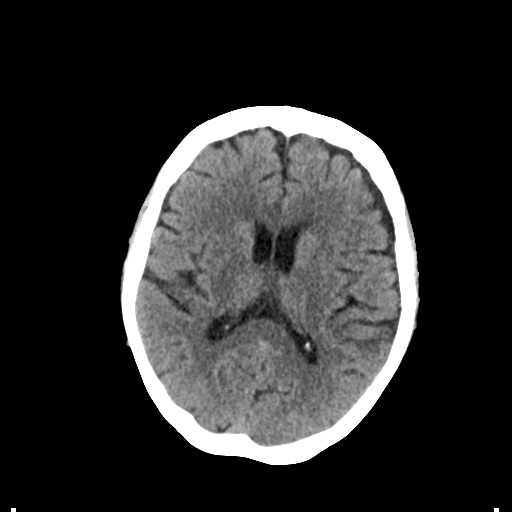
[im 11/25  bone]
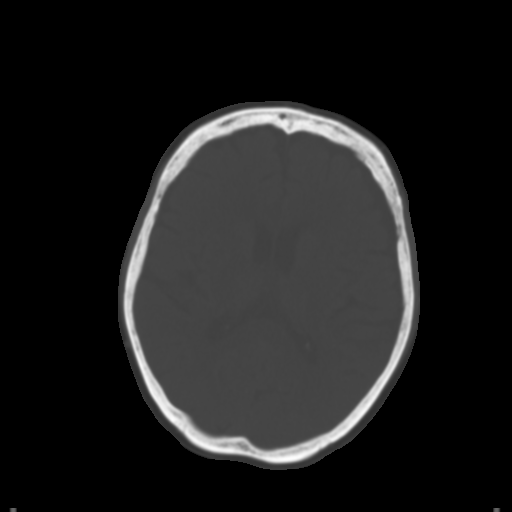
[im 13/25  brain]
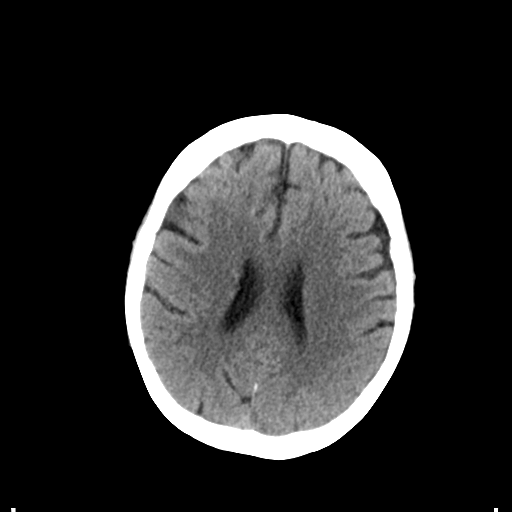
[im 14/25  brain]
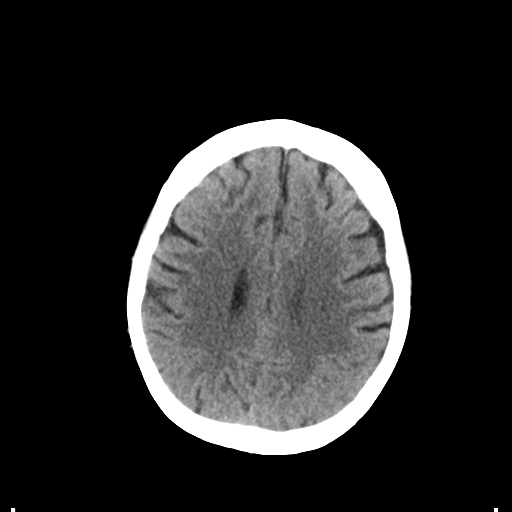
[im 17/25  brain]
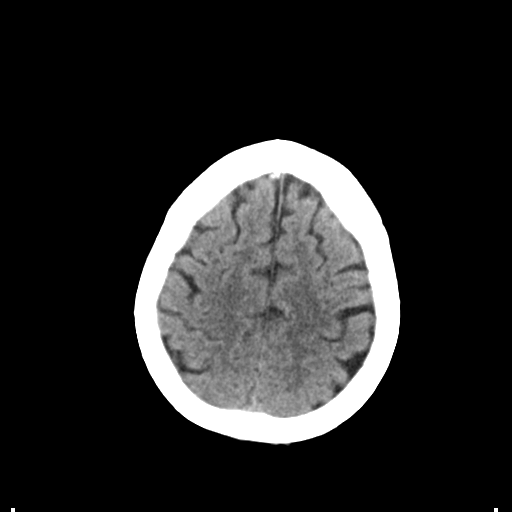
[im 18/25  brain]
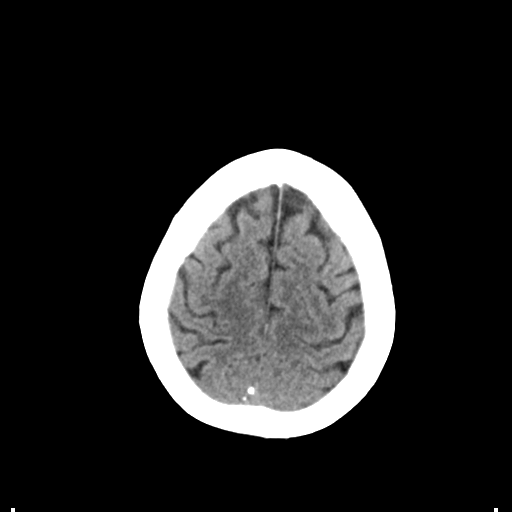
[im 18/25  bone]
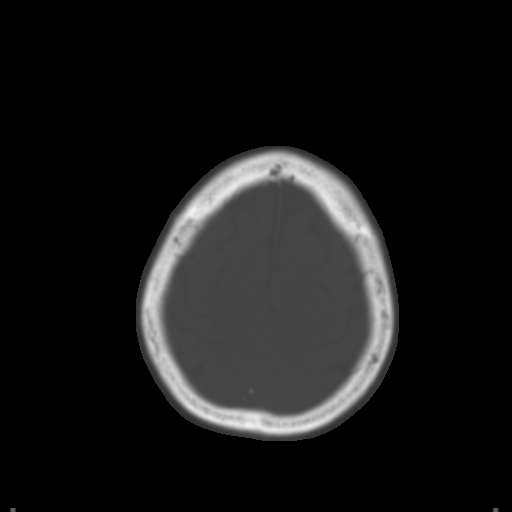
[im 21/25  brain]
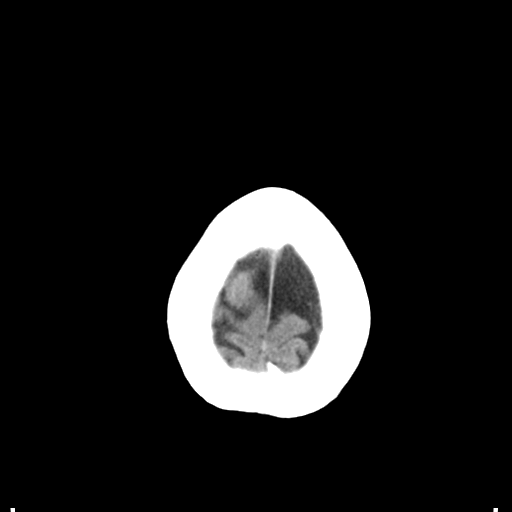
[im 23/25  brain]
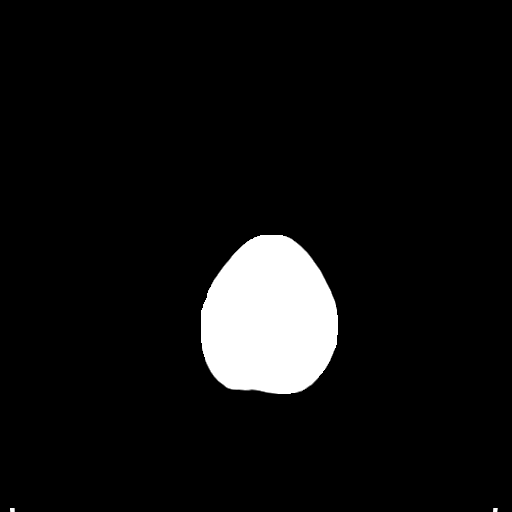

[Series 4: coronal soft tissue · coronal · 0.26mm/px · 3 of 57 slices shown]
[im 7/57  brain]
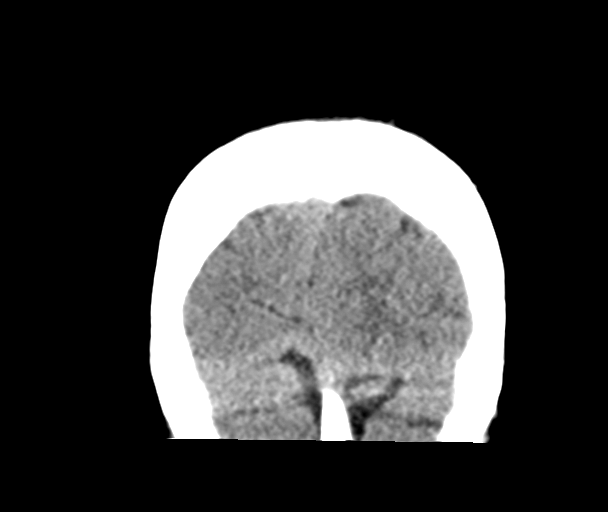
[im 13/57  brain]
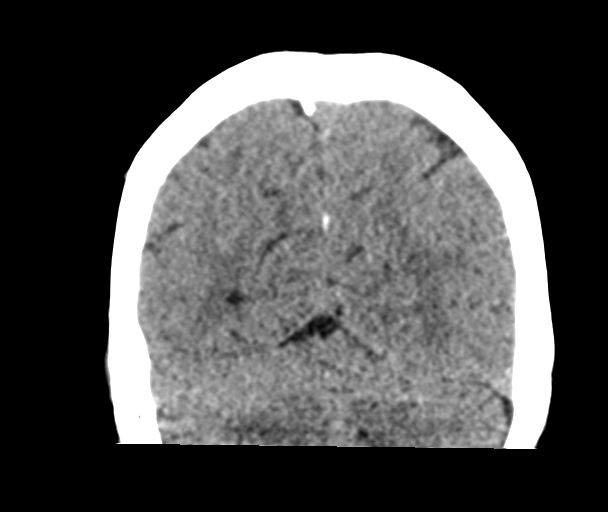
[im 19/57  brain]
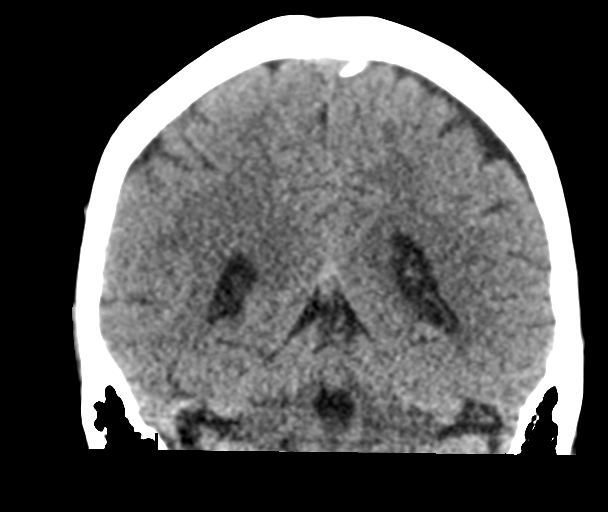

[Series 5: sagittal soft tissue · sagittal · 0.26mm/px · 2 of 54 slices shown]
[im 18/54  brain]
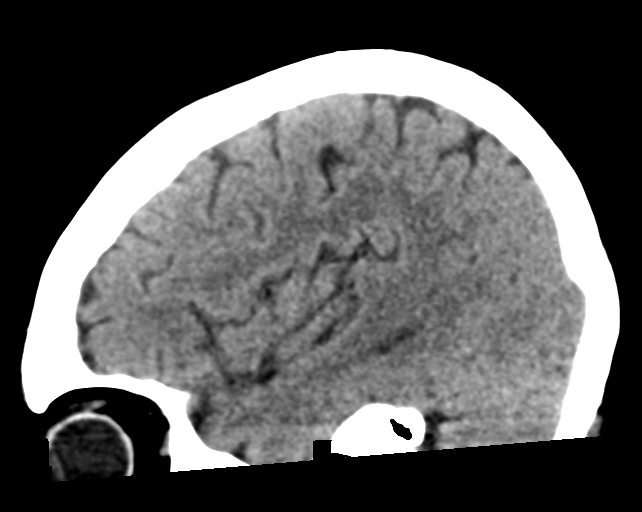
[im 36/54  brain]
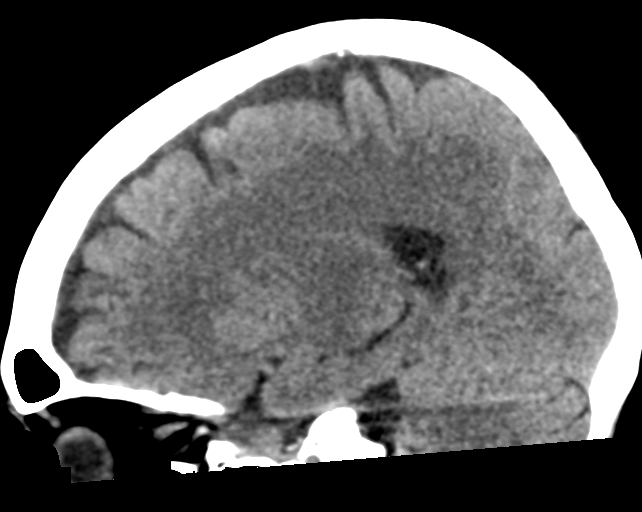

[16 of 47 positions shown; findings below may reference images not displayed]

FINDINGS: Brain: Mild diffuse cortical atrophy is noted. No mass effect or
midline shift is noted. Ventricular size is within normal limits.
There is no evidence of mass lesion, hemorrhage or acute infarction.

Vascular: No hyperdense vessel or unexpected calcification.

Skull: Normal. Negative for fracture or focal lesion.

Sinuses/Orbits: Mild right sphenoid sinusitis is noted.

Other: None.
IMPRESSION: Mild diffuse cortical atrophy. No acute intracranial abnormality
seen.

## 2019-02-18 ENCOUNTER — Ambulatory Visit: Payer: Medicare HMO

## 2019-02-18 ENCOUNTER — Telehealth: Payer: Self-pay | Admitting: Internal Medicine

## 2019-02-18 DIAGNOSIS — R197 Diarrhea, unspecified: Secondary | ICD-10-CM

## 2019-02-18 NOTE — Telephone Encounter (Signed)
Pt was seen last week by Dr. Hilarie Fredrickson. Per OV notes at that time of visit pt had constipation. Pt is calling today stating that for the past 4 days she cannot leave the house due to diarrhea. Confirmed with pt that she is not taking miralax right now. Reports she is taking Imodium after she has a diarrhea stool and this seems to slow the diarrhea. States Dr. Hilarie Fredrickson mentioned doing to testing to find out why she is having the diarrhea, does not want to say it is just IBS without doing tests to make sure. Please advise.

## 2019-02-18 NOTE — Telephone Encounter (Signed)
Pt states that her sxs have not improved and would like some advise.

## 2019-02-18 NOTE — Telephone Encounter (Signed)
Spoke with pt and she is aware, orders in epic. 

## 2019-02-18 NOTE — Telephone Encounter (Signed)
Gi pathogen panel Fecal elastase Fecal leukocytes

## 2019-02-20 ENCOUNTER — Other Ambulatory Visit: Payer: Medicare HMO

## 2019-02-20 DIAGNOSIS — R197 Diarrhea, unspecified: Secondary | ICD-10-CM | POA: Diagnosis not present

## 2019-02-20 NOTE — Addendum Note (Signed)
Addended by: Raliegh Ip on: 02/20/2019 11:57 AM   Modules accepted: Orders

## 2019-02-23 LAB — SPECIMEN STATUS REPORT

## 2019-02-23 NOTE — Progress Notes (Addendum)
Subjective:   Tamara Robertson is a 85 y.o. female who presents for Medicare Annual (Subsequent) preventive examination. I connected with patient by a telephone and verified that I am speaking with the correct person using two identifiers. Patient stated full name and DOB. Patient gave permission to continue with telephonic visit. Patient's location was at home and Nurse's location was at [place] office. Participants during this visit included patient and nurse. Review of Systems:   Cardiac Risk Factors include: advanced age (>35men, >30 women) Sleep patterns: feels rested on waking, gets up 0-1 times nightly to void and sleeps 8-9 hours nightly.    Home Safety/Smoke Alarms: Feels safe in home. Smoke alarms in place.  Living environment; residence and Firearm Safety: 1-story house/ trailer. Lives alone, no needs for DME, good support system Seat Belt Safety/Bike Helmet: Wears seat belt.     Objective:     Vitals: There were no vitals taken for this visit.  There is no height or weight on file to calculate BMI.  Advanced Directives 02/24/2019 01/24/2018 12/11/2017 09/20/2017 09/20/2017 06/26/2017 06/25/2017  Does Patient Have a Medical Advance Directive? Yes Yes Yes Yes Yes Yes Yes  Type of Paramedic of [place];Living will Living will [place];Living will [place];Living will Living will;Healthcare Power of [place];Living will [place];Living will  Does patient want to make changes to medical advance directive? - - - No - Patient declined - No - Patient declined -  Copy of [place] in Chart? No - copy requested Yes No - copy requested No - copy requested - No - copy requested -  Would patient like information on creating a medical advance directive? - - - - - - -    Tobacco Social History   Tobacco Use  Smoking Status Never Smoker  Smokeless Tobacco  Never Used     Counseling given: Not Answered  Past Medical History:  Diagnosis Date  . Anxiety   . CAD (coronary artery disease)    RCA 40% stenosis cath 01/2011  . Cholelithiasis    s/p lap chole 09/2014  . Diverticular stricture [facility]) 2006   [facility]  . DIVERTICULITIS, HX OF   . Diverticulosis   . DYSLIPIDEMIA   . Elevated LFTs   . GERD   . Glaucoma   . Hepatic steatosis   . HOH (hard of hearing)   . Immune thrombocytopenic purpura (HCC)    chronic - baseline 80-100K, on pred  . Irritable bowel syndrome   . Left ovarian cyst dx 01/2013 CT   working with gyn, ?malignant - elevated tumor marker OVA1  . OSTEOARTHRITIS, KNEE, RIGHT   . OSTEOPENIA   . OVERACTIVE BLADDER   . Prolapse of female bladder, acquired 11/2017  . UNSPECIFIED PERIPHERAL VASCULAR DISEASE   . URINARY INCONTINENCE    Past Surgical History:  Procedure Laterality Date  . ABDOMINAL HYSTERECTOMY  1963  . ANGIOPLASTY    . APPENDECTOMY  1956  . CARDIAC CATHETERIZATION    . CATARACT EXTRACTION, BILATERAL  10/2010  . CHOLECYSTECTOMY N/A 09/16/2014   Procedure: LAPAROSCOPIC CHOLECYSTECTOMY ;  Surgeon: Rolm Bookbinder, MD;  Location: [facility];  Service: General;  Laterality: N/A;  . KNEE ARTHROSCOPY Right   . L pop PTA  10/2009   stent  . LAPAROSCOPIC SIGMOID COLECTOMY  10/2005  . SPLENECTOMY  1954  . [address]  History  Problem Relation Age of Onset  . Coronary artery disease Mother   . Heart attack Mother 41  . Hyperlipidemia Mother   . Hypertension Mother   . Stomach cancer Father   . Hypertension Daughter   . Hyperlipidemia Daughter   . Arthritis Other        parent  . Transient ischemic attack Other        parent  . Colon cancer Neg Hx    Social History   Socioeconomic History  . Marital status: Widowed    Spouse name: Not on file  . Number of children: 2  . Years of education: Not on file  . Highest education level: Not on file   Occupational History  . Occupation: Retired    Comment: Retail banker  Social Needs  . Financial resource strain: Not hard at all  . Food insecurity    Worry: Never true    Inability: Never true  . Transportation needs    Medical: No    Non-medical: No  Tobacco Use  . Smoking status: Never Smoker  . Smokeless tobacco: Never Used  Substance and Sexual Activity  . Alcohol use: No    Alcohol/week: 0.0 standard drinks    Comment: rarely  . Drug use: No  . Sexual activity: Not Currently    Comment: 1st intercourse- 19, partners- 2,  widow  Lifestyle  . Physical activity    Days per week: 0 days    Minutes per session: 0 min  . Stress: Only a little  Relationships  . Social connections    Talks on phone: More than three times a week    Gets together: More than three times a week    Attends religious service: More than 4 times per year    Active member of club or organization: Yes    Attends meetings of clubs or organizations: More than 4 times per year    Relationship status: Widowed  Other Topics Concern  . 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    Dorie Rank to [place] from [place] 05/2010 to be close to kids    Outpatient Encounter Medications as of 02/24/2019  Medication Sig  . acetaminophen (TYLENOL) 500 MG tablet Take 500 mg by mouth every 6 (six) hours as needed.  . Biotin 2500 MCG CAPS Take 1 capsule by mouth daily.   . Calcium-Magnesium-Vitamin D (CITRACAL CALCIUM+D) 600-40-500 MG-MG-UNIT TB24 Take 1 tablet by mouth daily.   . cycloSPORINE (RESTASIS) 0.05 % ophthalmic emulsion 1 drop every 12 (twelve) hours.  . docusate sodium (COLACE) 100 MG capsule Take 100 mg by mouth 2 (two) times daily.  . dorzolamide-timolol (COSOPT) 22.3-6.8 MG/ML ophthalmic solution Place 1 drop into both eyes 2 (two) times daily.  . Flaxseed MISC Take 5 mLs by mouth daily. Take 1 tsp daily  . furosemide (LASIX) 20 MG tablet  TAKE 1 TABLET BY MOUTH AS NEEDED  . Glucos-Chond-Hyal Ac-Ca Fructo (MOVE FREE JOINT HEALTH ADVANCE PO) Take by mouth.  . losartan (COZAAR) 25 MG tablet Take 1 tablet (25 mg total) by mouth 2 (two) times daily.  . Multiple Vitamin (MULTIVITAMIN) tablet Take 1 tablet by mouth daily.    . naproxen sodium (ALEVE) 220 MG tablet Take 220 mg by mouth.  . polyethylene glycol (MIRALAX / GLYCOLAX) packet Take 17 g by mouth daily as needed for moderate constipation.   . predniSONE (DELTASONE) 2.5 MG  tablet TAKE ONE TABLET BY MOUTH EVERY OTHER DAY.  . Probiotic Product (PROBIOTIC DAILY PO) Take by mouth.   No facility-administered encounter medications on file as of 02/24/2019.     Activities of Daily Living In your present state of health, do you have any difficulty performing the following activities: 02/24/2019  Hearing? N  Vision? N  Difficulty concentrating or making decisions? N  Walking or climbing stairs? N  Dressing or bathing? N  Doing errands, shopping? N  Preparing Food and eating ? N  Using the Toilet? N  In the past six months, have you accidently leaked urine? N  Do you have problems with loss of bowel control? N  Managing your Medications? N  Managing your Finances? N  Housekeeping or managing your Housekeeping? N  Some recent data might be hidden    Patient Care Team: Binnie Rail, MD as PCP - General (Internal Medicine) Almedia Balls, MD as Consulting Physician (Orthopedic Surgery) [name], MD as Consulting Physician (Cardiology) Rolm Bookbinder, MD as Consulting Physician (Dermatology) Wyatt Portela, MD (Hematology and Oncology) Nancy Marus, MD (Gynecologic Oncology) Philemon Kingdom, MD (Endocrinology) Pyrtle, Lajuan Lines, MD as Consulting Physician (Gastroenterology)    Assessment:   This is a routine wellness examination for Tinea. Physical assessment deferred to PCP.  Exercise Activities and Dietary recommendations Current Exercise Habits: The patient does  not participate in regular exercise at present Diet (meal preparation, eat out, water intake, caffeinated beverages, dairy products, fruits and vegetables): in general, a "healthy" diet  , on average, 2 meals per day   Encouraged patient to increase daily water and healthy fluid intake.  Goals    . Patient Stated     Increase my physical activity by walking and doing chair exercises during commercials when I am watching TV. Continue to volunteer and stay active in church.    . Stay as active and as independent as possible       Fall Risk Fall Risk  02/24/2019 05/16/2018 02/18/2018 12/11/2017 12/11/2016  Falls in the past year? 0 0 No Yes No  Number falls in past yr: 0 0 - 1 -  Injury with Fall? 0 0 - No -  Risk Factor Category  - - - - -  Risk for fall due to : Impaired balance/gait - - Impaired balance/gait -  Risk for fall due to: Comment feels light headed at times - - - -  Follow up - Falls evaluation completed - Education provided;Falls prevention discussed -   Is the patient's home free of loose throw rugs in walkways, pet beds, electrical cords, etc?   yes      Grab bars in the bathroom? yes      Handrails on the stairs?   yes      Adequate lighting?   yes  Depression Screen PHQ 2/9 Scores 02/24/2019 12/11/2017 12/11/2016 12/07/2016  PHQ - 2 Score 1 5 1 2   PHQ- 9 Score 3 13 3 6      Cognitive Function MMSE - Mini Mental State Exam 12/11/2017 12/04/2016  Orientation to time 5 5  Orientation to Place 5 5  Registration 3 3  Attention/ Calculation 5 5  Recall 3 1  Language- name 2 objects 2 2  Language- repeat 1 1  Language- follow 3 step command 3 3  Language- read & follow direction 1 1  Write a sentence 1 1  Copy design 1 1  Total score 30 28     6CIT  Screen 02/24/2019  What Year? 0 points  What month? 0 points  What time? 0 points  Count back from 20 0 points  Months in reverse 0 points  Repeat phrase 2 points  Total Score 2    Immunization History  Administered  Date(s) Administered  . Fluad Quad(high Dose 65+) 12/31/2018  . Influenza Split 02/04/2014, 02/14/2015  . Influenza-Unspecified 02/12/2013, 02/06/2016, 02/04/2017, 01/10/2018  . Pneumococcal Conjugate-13 08/24/2014  . Pneumococcal Polysaccharide-23 02/28/2011  . Td 02/28/2011  . Zoster 04/07/2007  . Zoster Recombinat (Shingrix) 05/23/2017, 10/25/2017    Screening Tests Health Maintenance  Topic Date Due  . DEXA SCAN  08/23/2019  . TETANUS/TDAP  02/27/2021  . INFLUENZA VACCINE  Completed  . PNA vac Low Risk Adult  Completed      Plan:    Reviewed health maintenance screenings with patient today and relevant education, vaccines, and/or referrals were provided.   Continue to eat heart healthy diet (full of fruits, vegetables, whole grains, lean protein, water--limit salt, fat, and sugar intake) and increase physical activity as tolerated.  Continue doing brain stimulating activities (puzzles, reading, adult coloring books, staying active) to keep memory sharp.   I have personally reviewed and noted the following in the patient's chart:   . Medical and social history . Use of alcohol, tobacco or illicit drugs  . Current medications and supplements . Functional ability and status . Nutritional status . Physical activity . Advanced directives . List of other physicians . Screenings to include cognitive, depression, and falls . Referrals and appointments  In addition, I have reviewed and discussed with patient certain preventive protocols, quality metrics, and best practice recommendations. A written personalized care plan for preventive services as well as general preventive health recommendations were provided to patient.     Michiel Cowboy, RN  02/24/2019    Medical screening examination/treatment/procedure(s) were performed by non-physician practitioner and as supervising physician I was immediately available for consultation/collaboration. I agree with above. Binnie Rail, MD

## 2019-02-24 ENCOUNTER — Other Ambulatory Visit: Payer: Self-pay

## 2019-02-24 ENCOUNTER — Ambulatory Visit (INDEPENDENT_AMBULATORY_CARE_PROVIDER_SITE_OTHER): Payer: Medicare HMO | Admitting: *Deleted

## 2019-02-24 DIAGNOSIS — Z0000 Encounter for general adult medical examination without abnormal findings: Secondary | ICD-10-CM | POA: Diagnosis not present

## 2019-02-24 DIAGNOSIS — R197 Diarrhea, unspecified: Secondary | ICD-10-CM

## 2019-02-26 DIAGNOSIS — I350 Nonrheumatic aortic (valve) stenosis: Secondary | ICD-10-CM | POA: Insufficient documentation

## 2019-02-26 LAB — GASTROINTESTINAL PATHOGEN PANEL PCR
C. difficile Tox A/B, PCR: NOT DETECTED
Campylobacter, PCR: NOT DETECTED
Cryptosporidium, PCR: NOT DETECTED
E coli (ETEC) LT/ST PCR: NOT DETECTED
E coli (STEC) stx1/stx2, PCR: NOT DETECTED
E coli 0157, PCR: NOT DETECTED
Giardia lamblia, PCR: NOT DETECTED
Norovirus, PCR: NOT DETECTED
Rotavirus A, PCR: NOT DETECTED
Salmonella, PCR: NOT DETECTED
Shigella, PCR: NOT DETECTED

## 2019-02-26 LAB — PANCREATIC ELASTASE, FECAL: Pancreatic Elastase-1, Stool: 357 mcg/g

## 2019-02-26 NOTE — Progress Notes (Signed)
Cardiology Office Note   Date:  02/27/2019   ID:  Dessence, Osler Jan 03, 1930, MRN [id number]   PCP:  Pincus Sanes, MD  Cardiologist:   No primary care provider on file.   Chief Complaint  Patient presents with  . PVD      History of Present Illness: Tamara Robertson is a 85 y.o. female who presents for evaluation of PT.  She has had non obstructive CAD.    She had remote PTA in [facility], and chronic LE edema.   She returns for one year follow up.    Since I last saw her she has had no new cardiovascular complaints.  She has 6 to 7 months of diarrhea alternating with constipation.  She had some nosebleeds and had to have this cauterized.  She has had reflux.  She has had some mild lightheadedness and leg weakness.  Her gait is off a little bit.  She is had some fleeting chest discomfort.  She has been stressed and depressed and lonely.  The social isolation of the pandemic seems to be particularly severe as she is lost her church family.   Past Medical History:  Diagnosis Date  . Anxiety   . CAD (coronary artery disease)    RCA 40% stenosis cath 01/2011  . Cholelithiasis    s/p lap chole 09/2014  . Diverticular stricture [facility]) 2006   [facility]  . DIVERTICULITIS, HX OF   . Diverticulosis   . DYSLIPIDEMIA   . Elevated LFTs   . GERD   . Glaucoma   . Hepatic steatosis   . HOH (hard of hearing)   . Immune thrombocytopenic purpura (HCC)    chronic - baseline 80-100K, on pred  . Irritable bowel syndrome   . Left ovarian cyst dx 01/2013 CT   working with gyn, ?malignant - elevated tumor marker OVA1  . OSTEOARTHRITIS, KNEE, RIGHT   . OSTEOPENIA   . OVERACTIVE BLADDER   . Prolapse of female bladder, acquired 11/2017  . UNSPECIFIED PERIPHERAL VASCULAR DISEASE   . URINARY INCONTINENCE     Past Surgical History:  Procedure Laterality Date  . ABDOMINAL HYSTERECTOMY  1963  . ANGIOPLASTY    . APPENDECTOMY  1956  . CARDIAC  CATHETERIZATION    . CATARACT EXTRACTION, BILATERAL  10/2010  . CHOLECYSTECTOMY N/A 09/16/2014   Procedure: LAPAROSCOPIC CHOLECYSTECTOMY ;  Surgeon: Emelia Loron, MD;  Location: [facility] OR;  Service: General;  Laterality: N/A;  . KNEE ARTHROSCOPY Right   . L pop PTA  10/2009   stent  . LAPAROSCOPIC SIGMOID COLECTOMY  10/2005  . SPLENECTOMY  1954  . VARICOSE VEIN SURGERY Right 1962     Current Outpatient Medications  Medication Sig Dispense Refill  . acetaminophen (TYLENOL) 500 MG tablet Take 500 mg by mouth every 6 (six) hours as needed.    . Biotin 2500 MCG CAPS Take 1 capsule by mouth daily.     . Calcium-Magnesium-Vitamin D (CITRACAL CALCIUM+D) 600-40-500 MG-MG-UNIT TB24 Take 1 tablet by mouth daily.     . cycloSPORINE (RESTASIS) 0.05 % ophthalmic emulsion 1 drop every 12 (twelve) hours.    . docusate sodium (COLACE) 100 MG capsule Take 100 mg by mouth 2 (two) times daily.    . dorzolamide-timolol (COSOPT) 22.3-6.8 MG/ML ophthalmic solution Place 1 drop into both eyes 2 (two) times daily. 10 mL 12  . Flaxseed MISC Take 5 mLs by mouth daily. Take 1 tsp daily    .  furosemide (LASIX) 20 MG tablet TAKE 1 TABLET BY MOUTH AS NEEDED 90 tablet 2  . Glucos-Chond-Hyal Ac-Ca Fructo (MOVE FREE JOINT HEALTH ADVANCE PO) Take by mouth.    . losartan (COZAAR) 25 MG tablet Take 1 tablet (25 mg total) by mouth 2 (two) times daily. 180 tablet 1  . Multiple Vitamin (MULTIVITAMIN) tablet Take 1 tablet by mouth daily.      . naproxen sodium (ALEVE) 220 MG tablet Take 220 mg by mouth.    . polyethylene glycol (MIRALAX / GLYCOLAX) packet Take 17 g by mouth daily as needed for moderate constipation.     . predniSONE (DELTASONE) 2.5 MG tablet TAKE ONE TABLET BY MOUTH EVERY OTHER DAY. 45 tablet 0  . Probiotic Product (PROBIOTIC DAILY PO) Take by mouth.     No current facility-administered medications for this visit.     Allergies:   Aspirin, Sulfa antibiotics, Contrast media [iodinated diagnostic agents], and  Statins    ROS:  Please see the history of present illness.   Otherwise, review of systems are positive for none.   All other systems are reviewed and negative.     PHYSICAL EXAM: VS:  BP 138/62   Pulse 71   Ht 5\' 1"  (1.549 m)   Wt 144 lb (65.3 kg)   BMI 27.21 kg/m  , BMI Body mass index is 27.21 kg/m. GENERAL:  Well appearing NECK:  No jugular venous distention, waveform within normal limits, carotid upstroke brisk and symmetric, no bruits, no thyromegaly LUNGS:  Clear to auscultation bilaterally CHEST:  Unremarkable HEART:  PMI not displaced or sustained,S1 and S2 within normal limits, no S3, no S4, no clicks, no rubs, soft apical 2 out of 6 systolic murmur early peaking, no diastolic murmurs ABD:  Flat, positive bowel sounds normal in frequency in pitch, no bruits, no rebound, no guarding, no midline pulsatile mass, no hepatomegaly, no splenomegaly EXT:  2 plus pulses throughout, no edema, no cyanosis no clubbing  EKG:  EKG is not  ordered today. Sinus rhythm, rate 71, axis within normal limits, intervals within normal limits, no acute ST-T wave changes.  Recent Labs: 11/19/2018: ALT 12; BUN 19; Creatinine, Ser 0.90; Potassium 4.6; Sodium 140 12/23/2018: Hemoglobin 12.5; Platelet Count 200 02/11/2019: TSH 2.23    Lipid Panel    Component Value Date/Time   CHOL 196 07/04/2017 1150   CHOL 215 (H) 06/22/2014 0825   TRIG 193 (H) 06/30/2017 0708   TRIG 161 (H) 06/22/2014 0825   TRIG 228 02/20/2010   HDL 58.40 05/23/2017 1151   HDL 51 06/22/2014 0825   CHOLHDL 3 05/23/2017 1151   VLDL 23.0 05/23/2017 1151   LDLCALC 107 (H) 05/23/2017 1151   LDLCALC 132 (H) 06/22/2014 0825   LDLDIRECT 155.9 03/19/2014 0828      Wt Readings from Last 3 Encounters:  02/27/19 144 lb (65.3 kg)  02/11/19 145 lb 6 oz (65.9 kg)  01/14/19 144 lb (65.3 kg)      Other studies Reviewed: Additional studies/ records that were reviewed today include: Labs Review of the above records demonstrates:   NA    ASSESSMENT AND PLAN:   CAD (coronary artery disease) The patient has no new sypmtoms.  No further cardiovascular testing is indicated.  We will continue with aggressive risk reduction and meds as listed. She had minor CAD at cath 2012- (40% RCA).  Aortic stenosis, moderate Normal LVF March 2019.  I would not suspect that this is worse clinically.  No change in  therapy.  Hypertension The blood pressure is at target no change in therapy.   PVD (peripheral vascular disease) (HCC) H/O LEA stent [facility] in 2011.   She is not having any new chest discomfort.  Stress I contacted Pincus Sanes, MD.  The patient does not want to take fluoxetine which was prescribed but might need some other therapy for stress and anxiety.  I also discussed with her for a while possible strategies to avoid social isolation including getting more connected electronically as she does have a computer.  Current medicines are reviewed at length with the patient today.  The patient does not have concerns regarding medicines.  The following changes have been made:  None  Labs/ tests ordered today include: None  Orders Placed This Encounter  Procedures  . EKG 12-Lead     Disposition:   FU with me in one year.     Signed, Rollene Rotunda, MD  02/27/2019 3:02 PM    [facility]

## 2019-02-27 ENCOUNTER — Other Ambulatory Visit: Payer: Self-pay

## 2019-02-27 ENCOUNTER — Encounter: Payer: Self-pay | Admitting: Cardiology

## 2019-02-27 ENCOUNTER — Ambulatory Visit: Payer: Medicare HMO | Admitting: Cardiology

## 2019-02-27 VITALS — BP 138/62 | HR 71 | Ht 61.0 in | Wt 144.0 lb

## 2019-02-27 DIAGNOSIS — I350 Nonrheumatic aortic (valve) stenosis: Secondary | ICD-10-CM | POA: Diagnosis not present

## 2019-02-27 DIAGNOSIS — I739 Peripheral vascular disease, unspecified: Secondary | ICD-10-CM

## 2019-02-27 DIAGNOSIS — I2510 Atherosclerotic heart disease of native coronary artery without angina pectoris: Secondary | ICD-10-CM | POA: Diagnosis not present

## 2019-02-27 DIAGNOSIS — I10 Essential (primary) hypertension: Secondary | ICD-10-CM

## 2019-02-27 NOTE — Patient Instructions (Signed)
Medication Instructions:  Your physician recommends that you continue on your current medications as directed. Please refer to the Current Medication list given to you today.  If you need a refill on your cardiac medications before your next appointment, please call your pharmacy.   Lab work: NONE  Testing/Procedures: NONE  Follow-Up: At [facility], you and your health needs are our priority.  As part of our continuing mission to provide you with exceptional heart care, we have created designated Provider Care Teams.  These Care Teams include your primary Cardiologist (physician) and Advanced Practice Providers (APPs -  Physician Assistants and Nurse Practitioners) who all work together to provide you with the care you need, when you need it. You may see Dr. Percival Spanish or one of the following Advanced Practice Providers on your designated Care Team:    Rosaria Ferries, PA-C  Jory Sims, DNP, ANP  Cadence Kathlen Mody, NP   Your physician wants you to follow-up in: 1 year. You will receive a reminder letter in the mail two months in advance. If you don't receive a letter, please call our office to schedule the follow-up appointment.

## 2019-03-02 ENCOUNTER — Other Ambulatory Visit: Payer: Medicare HMO

## 2019-03-02 DIAGNOSIS — R197 Diarrhea, unspecified: Secondary | ICD-10-CM

## 2019-03-02 NOTE — Addendum Note (Signed)
Addended by: Isaiah Serge D on: 03/02/2019 04:53 PM   Modules accepted: Orders

## 2019-03-04 ENCOUNTER — Encounter: Payer: Self-pay | Admitting: Internal Medicine

## 2019-03-04 LAB — SPECIMEN STATUS REPORT

## 2019-03-05 MED ORDER — ESCITALOPRAM OXALATE 5 MG PO TABS: 5.0000 mg | ORAL_TABLET | Freq: Every day | ORAL | 5 refills | Status: DC

## 2019-03-05 NOTE — Addendum Note (Signed)
Addended by: Binnie Rail on: 03/05/2019 11:30 AM   Modules accepted: Orders

## 2019-03-06 ENCOUNTER — Encounter: Payer: Self-pay | Admitting: Obstetrics & Gynecology

## 2019-03-06 ENCOUNTER — Ambulatory Visit: Payer: Medicare HMO | Admitting: Obstetrics & Gynecology

## 2019-03-06 ENCOUNTER — Other Ambulatory Visit: Payer: Self-pay

## 2019-03-06 VITALS — BP 140/68 | Ht 61.0 in | Wt 145.0 lb

## 2019-03-06 DIAGNOSIS — N8110 Cystocele, unspecified: Secondary | ICD-10-CM | POA: Diagnosis not present

## 2019-03-06 DIAGNOSIS — Z01419 Encounter for gynecological examination (general) (routine) without abnormal findings: Secondary | ICD-10-CM

## 2019-03-06 DIAGNOSIS — Z780 Asymptomatic menopausal state: Secondary | ICD-10-CM

## 2019-03-06 DIAGNOSIS — M8589 Other specified disorders of bone density and structure, multiple sites: Secondary | ICD-10-CM

## 2019-03-06 DIAGNOSIS — N83202 Unspecified ovarian cyst, left side: Secondary | ICD-10-CM | POA: Diagnosis not present

## 2019-03-06 NOTE — Progress Notes (Signed)
Tamara Robertson 26-Oct-1929 [id number]   History:    85 y.o. G3P3L2  RP:  Established patient presenting for annual gyn exam   HPI: Longtime postmenopausal, well on no hormone replacement therapy.  Status post total hysterectomy.  No pelvic pain.  Followed for a left ovarian cyst.  Seen by Dr. Jillyn Robertson in July 2020 after a pelvic ultrasound showing an increase in size of the cyst from 4+ centimeters to 7 cm.  An MRI was done November 24, 2018 which showed stability and benign appearance.  Decision was therefore made to observe.  Abstinent.  Urine and bowel movements normal.  Breasts normal.  Body mass index 27.4.  Physically active every day.  Health labs with family physician.  Past medical history,surgical history, family history and social history were all reviewed and documented in the EPIC chart.  Gynecologic History No LMP recorded. Patient has had a hysterectomy. Contraception: status post hysterectomy Last Pap: 05/2005. Results were: Negative Last mammogram: 11/2018. Results were: Negative Bone Density: 08/2016 Osteopenia LFN -2.2 Colonoscopy: 01/2005  Obstetric History OB History  Gravida Para Term Preterm AB Living  3 3       2   SAB TAB Ectopic Multiple Live Births               # Outcome Date GA Lbr Len/2nd Weight Sex Delivery Anes PTL Lv  3 Para           2 Para           1 Para             Obstetric Comments  1 child passed away at 20     ROS: A ROS was performed and pertinent positives and negatives are included in the history.  GENERAL: No fevers or chills. HEENT: No change in vision, no earache, sore throat or sinus congestion. NECK: No pain or stiffness. CARDIOVASCULAR: No chest pain or pressure. No palpitations. PULMONARY: No shortness of breath, cough or wheeze. GASTROINTESTINAL: No abdominal pain, nausea, vomiting or diarrhea, melena or bright red blood per rectum. GENITOURINARY: No urinary frequency, urgency, hesitancy or dysuria. MUSCULOSKELETAL: No joint or  muscle pain, no back pain, no recent trauma. DERMATOLOGIC: No rash, no itching, no lesions. ENDOCRINE: No polyuria, polydipsia, no heat or cold intolerance. No recent change in weight. HEMATOLOGICAL: No anemia or easy bruising or bleeding. NEUROLOGIC: No headache, seizures, numbness, tingling or weakness. PSYCHIATRIC: No depression, no loss of interest in normal activity or change in sleep pattern.     Exam:   BP 140/68 (BP Location: Right Arm, Patient Position: Sitting, Cuff Size: Normal)   Ht 5\' 1"  (1.549 m)   Wt 145 lb (65.8 kg)   BMI 27.40 kg/m   Body mass index is 27.4 kg/m.  General appearance : Well developed well nourished female. No acute distress HEENT: Eyes: no retinal hemorrhage or exudates,  Neck supple, trachea midline, no carotid bruits, no thyroidmegaly Lungs: Clear to auscultation, no rhonchi or wheezes, or rib retractions  Heart: Regular rate and rhythm, no murmurs or gallops Breast:Examined in sitting and supine position were symmetrical in appearance, no palpable masses or tenderness,  no skin retraction, no nipple inversion, no nipple discharge, no skin discoloration, no axillary or supraclavicular lymphadenopathy Abdomen: no palpable masses or tenderness, no rebound or guarding Extremities: no edema or skin discoloration or tenderness  Pelvic: Vulva: Normal             Vagina: No gross lesions or discharge.  Cystocele grade 2/4.  Cervix/Uterus absent  Adnexa   Anus: Normal  MRI Abdomen/Pelvis 11/24/18: Reproductive: Status post hysterectomy.  Right ovary is not discretely visualized.  3.6 x 4.2 x 3.3 cm predominantly simple left ovarian cystic lesion (series 5/image 8), with mildly irregular margins and incomplete septations along the inferolateral margin of the lesion (series 5/image 9), but without solid component/mural nodularity. Lesion measured 2.6 x 3.0 x 2.7 cm on prior 2014 CT, indicating mild growth, but favoring a benign etiology. No pelvic ascites.    Assessment/Plan:  85 y.o. female for annual exam   1. Well female exam with routine gynecological exam Gynecologic exam status post total hysterectomy and menopause.  No indication to repeat Pap tests.  Breast exam normal.  Screening mammogram July 2020 was negative.  Not repeating colonoscopies.  Bone density 27.4.  Continue with physical activities regularly and healthy nutrition.  Health labs with family physician.  2. Postmenopausal Well on no hormone replacement therapy.  3. Osteopenia of multiple sites Osteopenia at multiple sites on bone density in April 2018.  We will repeat a bone density now.  Continue with vitamin D supplements, calcium intake of 1200 mg daily and regular weightbearing physical activities.  4. Baden-Walker grade 2 cystocele Asymptomatic stable cystocele grade 2/4, observing.  5. Left ovarian cyst Left ovarian cyst with benign appearance and stability on MRI in July 2020.  Followed by GYN, oncology Dr. Rica Robertson.  Decision to observe.  Counseling on above issues and coordination of care more than 50% for 10 minutes.  Tamara Del MD, 2:07 PM 03/06/2019

## 2019-03-07 LAB — FECAL LEUKOCYTES

## 2019-03-08 ENCOUNTER — Encounter: Payer: Self-pay | Admitting: Obstetrics & Gynecology

## 2019-03-08 NOTE — Patient Instructions (Signed)
1. Well female exam with routine gynecological exam Gynecologic exam status post total hysterectomy and menopause.  No indication to repeat Pap tests.  Breast exam normal.  Screening mammogram July 2020 was negative.  Not repeating colonoscopies.  Bone density 27.4.  Continue with physical activities regularly and healthy nutrition.  Health labs with family physician.  2. Postmenopausal Well on no hormone replacement therapy.  3. Osteopenia of multiple sites Osteopenia at multiple sites on bone density in April 2018.  We will repeat a bone density now.  Continue with vitamin D supplements, calcium intake of 1200 mg daily and regular weightbearing physical activities.  4. Baden-Walker grade 2 cystocele Asymptomatic stable cystocele grade 2/4, observing.  5. Left ovarian cyst Left ovarian cyst with benign appearance and stability on MRI in July 2020.  Followed by GYN, oncology Dr. Rogelio Seen.  Decision to observe.  Tamara Robertson, it was a pleasure seeing you today!

## 2019-03-11 LAB — OVA AND PARASITE EXAMINATION

## 2019-03-11 LAB — FECAL LEUKOCYTES

## 2019-03-12 ENCOUNTER — Ambulatory Visit: Payer: Medicare HMO | Admitting: Cardiology

## 2019-03-12 NOTE — Progress Notes (Deleted)
Subjective:    Patient ID: Tamara Robertson, female    DOB: 05-Jul-1929, 85 y.o.   MRN: [id number]  HPI The patient is here for an acute visit.   Diarrhea:  Dr Rhea Belton feels her diarrhea is likely an overflow phenomenon from chronic constipation.  She take miralax daily.  protonix was stopped to see if that was the cause of her diarrhea.   Depression: She is taking her medication daily as prescribed. She denies any side effects from the medication. She feels her depression is well controlled and she is happy with her current dose of medication.   Osteopenia:    Medications and allergies reviewed with patient and updated if appropriate.  Patient Active Problem List   Diagnosis Date Noted  . Nonrheumatic aortic valve stenosis 02/26/2019  . Lightheadedness 11/19/2018  . Epistaxis 11/19/2018  . 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 Rash 03/25/2018  . Chronic left-sided headaches 01/28/2018  . Depression 12/18/2017  . SBO (small bowel obstruction) (HCC) 09/20/2017  . Aortic stenosis, moderate 08/22/2017  . CAD (coronary artery disease) 08/21/2017  . Pleural effusion on right   . Streptococcal pneumonia (HCC) 06/26/2017  . Community acquired pneumonia of right lower lobe of lung 06/25/2017  . Current chronic use of systemic steroids 06/25/2017  . Chronic lower back pain 05/14/2017  . Varicose veins of left lower extremity with complications 03/05/2017  . Bilateral hearing loss 02/01/2017  . Hypertension 07/31/2016  . Leg edema 07/31/2016  . Prediabetes 06/05/2016  . Osteopenia 12/01/2015  . Anxiety 07/19/2015  . Degenerative cervical disc 12/28/2014  . Left ovarian cyst   . Peripheral neuropathy 06/03/2012  . PVD (peripheral vascular disease) (HCC) 11/23/2010  . Bursitis of hip 10/26/2010  . Hyperlipidemia 06/27/2010  . IMMUNE THROMBOCYTOPENIC PURPURA 06/27/2010  . Unspecified glaucoma 06/27/2010  . GERD 06/27/2010  . Irritable bowel syndrome 06/27/2010  . OVERACTIVE  BLADDER 06/27/2010  . OSTEOARTHRITIS, KNEE, RIGHT 06/27/2010  . URINARY INCONTINENCE 06/27/2010    Current Outpatient Medications on File Prior to Visit  Medication Sig Dispense Refill  . acetaminophen (TYLENOL) 500 MG tablet Take 500 mg by mouth every 6 (six) hours as needed.    . Biotin 2500 MCG CAPS Take 1 capsule by mouth daily.     . Calcium-Magnesium-Vitamin D (CITRACAL CALCIUM+D) 600-40-500 MG-MG-UNIT TB24 Take 1 tablet by mouth daily.     . cycloSPORINE (RESTASIS) 0.05 % ophthalmic emulsion 1 drop every 12 (twelve) hours.    . docusate sodium (COLACE) 100 MG capsule Take 100 mg by mouth 2 (two) times daily.    . dorzolamide-timolol (COSOPT) 22.3-6.8 MG/ML ophthalmic solution Place 1 drop into both eyes 2 (two) times daily. 10 mL 12  . escitalopram (LEXAPRO) 5 MG tablet Take 1 tablet (5 mg total) by mouth daily. 30 tablet 5  . Flaxseed MISC Take 5 mLs by mouth daily. Take 1 tsp daily    . furosemide (LASIX) 20 MG tablet TAKE 1 TABLET BY MOUTH AS NEEDED 90 tablet 2  . Glucos-Chond-Hyal Ac-Ca Fructo (MOVE FREE JOINT HEALTH ADVANCE PO) Take by mouth.    . losartan (COZAAR) 25 MG tablet Take 1 tablet (25 mg total) by mouth 2 (two) times daily. 180 tablet 1  . Multiple Vitamin (MULTIVITAMIN) tablet Take 1 tablet by mouth daily.      . naproxen sodium (ALEVE) 220 MG tablet Take 220 mg by mouth.    . polyethylene glycol (MIRALAX / GLYCOLAX) packet Take 17 g by mouth daily as needed  for moderate constipation.     . predniSONE (DELTASONE) 2.5 MG tablet TAKE ONE TABLET BY MOUTH EVERY OTHER DAY. 45 tablet 0  . Probiotic Product (PROBIOTIC DAILY PO) Take by mouth.     No current facility-administered medications on file prior to visit.     Past Medical History:  Diagnosis Date  . Anxiety   . CAD (coronary artery disease)    RCA 40% stenosis cath 01/2011  . Cholelithiasis    s/p lap chole 09/2014  . Diverticular stricture [facility]) 2006   [facility]  . DIVERTICULITIS, HX  OF   . Diverticulosis   . DYSLIPIDEMIA   . Elevated LFTs   . GERD   . Glaucoma   . Hepatic steatosis   . HOH (hard of hearing)   . Immune thrombocytopenic purpura (HCC)    chronic - baseline 80-100K, on pred  . Irritable bowel syndrome   . Left ovarian cyst dx 01/2013 CT   working with gyn, ?malignant - elevated tumor marker OVA1  . OSTEOARTHRITIS, KNEE, RIGHT   . OSTEOPENIA   . OVERACTIVE BLADDER   . Prolapse of female bladder, acquired 11/2017  . UNSPECIFIED PERIPHERAL VASCULAR DISEASE   . URINARY INCONTINENCE     Past Surgical History:  Procedure Laterality Date  . ABDOMINAL HYSTERECTOMY  1963  . ANGIOPLASTY    . APPENDECTOMY  1956  . CARDIAC CATHETERIZATION    . CATARACT EXTRACTION, BILATERAL  10/2010  . CHOLECYSTECTOMY N/A 09/16/2014   Procedure: LAPAROSCOPIC CHOLECYSTECTOMY ;  Surgeon: Emelia Loron, MD;  Location: [facility] OR;  Service: General;  Laterality: N/A;  . KNEE ARTHROSCOPY Right   . L pop PTA  10/2009   stent  . LAPAROSCOPIC SIGMOID COLECTOMY  10/2005  . SPLENECTOMY  1954  . VARICOSE VEIN SURGERY Right 1962    Social History   Socioeconomic History  . Marital status: Widowed    Spouse name: Not on file  . Number of children: 2  . Years of education: Not on file  . Highest education level: Not on file  Occupational History  . Occupation: Retired    Comment: Programmer, multimedia  Social Needs  . Financial resource strain: Not hard at all  . Food insecurity    Worry: Never true    Inability: Never true  . Transportation needs    Medical: No    Non-medical: No  Tobacco Use  . Smoking status: Never Smoker  . Smokeless tobacco: Never Used  Substance and Sexual Activity  . Alcohol use: No    Alcohol/week: 0.0 standard drinks    Comment: rarely  . Drug use: No  . Sexual activity: Not Currently    Comment: 1st intercourse- 19, partners- 2,  widow  Lifestyle  . Physical activity    Days per week: 0 days    Minutes per session: 0 min  .  Stress: Only a little  Relationships  . Social connections    Talks on phone: More than three times a week    Gets together: More than three times a week    Attends religious service: More than 4 times per year    Active member of club or organization: Yes    Attends meetings of clubs or organizations: More than 4 times per year    Relationship status: Widowed  Other Topics Concern  . 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    Lawyer to  [place] from [place] 05/2010 to be close to kids    Family History  Problem Relation Age of Onset  . Coronary artery disease Mother   . Heart attack Mother 60  . Hyperlipidemia Mother   . Hypertension Mother   . Stomach cancer Father   . Hypertension Daughter   . Hyperlipidemia Daughter   . Arthritis Other        parent  . Transient ischemic attack Other        parent  . Colon cancer Neg Hx     Review of Systems     Objective:  There were no vitals filed for this visit. BP Readings from Last 3 Encounters:  03/06/19 140/68  02/27/19 138/62  02/11/19 130/64   Wt Readings from Last 3 Encounters:  03/06/19 145 lb (65.8 kg)  02/27/19 144 lb (65.3 kg)  02/11/19 145 lb 6 oz (65.9 kg)   There is no height or weight on file to calculate BMI.   Physical Exam         Assessment & Plan:    See Problem List for Assessment and Plan of chronic medical problems.

## 2019-03-13 ENCOUNTER — Other Ambulatory Visit: Payer: Self-pay

## 2019-03-13 ENCOUNTER — Encounter: Payer: Self-pay | Admitting: Internal Medicine

## 2019-03-13 ENCOUNTER — Telehealth: Payer: Self-pay

## 2019-03-13 ENCOUNTER — Ambulatory Visit (INDEPENDENT_AMBULATORY_CARE_PROVIDER_SITE_OTHER): Payer: Medicare HMO | Admitting: Internal Medicine

## 2019-03-13 DIAGNOSIS — R197 Diarrhea, unspecified: Secondary | ICD-10-CM | POA: Diagnosis not present

## 2019-03-13 DIAGNOSIS — M8589 Other specified disorders of bone density and structure, multiple sites: Secondary | ICD-10-CM

## 2019-03-13 DIAGNOSIS — R69 Illness, unspecified: Secondary | ICD-10-CM | POA: Diagnosis not present

## 2019-03-13 DIAGNOSIS — F3289 Other specified depressive episodes: Secondary | ICD-10-CM

## 2019-03-13 NOTE — Telephone Encounter (Signed)
-----   Message from Binnie Rail, MD sent at 03/12/2019  4:14 PM EST ----- She has had diarrhea for a while and is following with GI - this is not covid - ok for in office visit

## 2019-03-13 NOTE — Assessment & Plan Note (Signed)
Due for a bone density Taking a multivitamin-we will add additional calcium and vitamin D If bone density is similar no treatment needed, but if there has been a decrease Prolia would be an option

## 2019-03-13 NOTE — Progress Notes (Signed)
Virtual Visit via Video Note  I connected with Tamara Robertson on 03/13/19 at  2:15 PM EST by a video enabled telemedicine application and verified that I am speaking with the correct person using two identifiers.   I discussed the limitations of evaluation and management by telemedicine and the availability of in person appointments. The patient expressed understanding and agreed to proceed.  Present for the visit:  Myself, Dr Billey Gosling, [name].  The patient is currently at home and I am in the office.    No referring provider.    History of Present Illness: The patient is here for an acute visit.   Diarrhea:  Dr Hilarie Fredrickson feels her diarrhea is likely an overflow phenomenon from chronic constipation.  She take miralax daily.  protonix was stopped to see if that was the cause of her diarrhea.  She is still experiencing some constipation and diarrhea.  She states her stools are 2-2-1/2 inches long and thin.  She does not feel like she has a good bowel movement.  There have been no fevers or abdominal pain.  There is no blood in the stool.  Depression: She does feel depressed.  She will not commit suicide, but would be okay with no longer being here.  She will be starting the Lexapro daily when she picks up the prescription-she had a get at a different pharmacy.  Osteopenia: She is due for a bone density and would like to have that scheduled.  She is taking a multivitamin daily.  Her gynecologist advised that she should increase her calcium and vitamin D.   Social History   Socioeconomic History  . Marital status: Widowed    Spouse name: Not on file  . Number of children: 2  . Years of education: Not on file  . Highest education level: Not on file  Occupational History  . Occupation: Retired    Comment: Retail banker  Social Needs  . Financial resource strain: Not hard at all  . Food insecurity    Worry: Never true    Inability: Never true  . Transportation needs     Medical: No    Non-medical: No  Tobacco Use  . Smoking status: Never Smoker  . Smokeless tobacco: Never Used  Substance and Sexual Activity  . Alcohol use: No    Alcohol/week: 0.0 standard drinks    Comment: rarely  . Drug use: No  . Sexual activity: Not Currently    Comment: 1st intercourse- 19, partners- 2,  widow  Lifestyle  . Physical activity    Days per week: 0 days    Minutes per session: 0 min  . Stress: Only a little  Relationships  . Social connections    Talks on phone: More than three times a week    Gets together: More than three times a week    Attends religious service: More than 4 times per year    Active member of club or organization: Yes    Attends meetings of clubs or organizations: More than 4 times per year    Relationship status: Widowed  Other Topics Concern  . 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    Dorie Rank to [place] from [place] 05/2010 to be close to kids     Observations/Objective: Appears well in NAD Breathing normally Mood and affect appropriate and normal  Assessment and Plan:  See Problem List for Assessment and  Plan of chronic medical problems.   Follow Up Instructions:    I discussed the assessment and treatment plan with the patient. The patient was provided an opportunity to ask questions and all were answered. The patient agreed with the plan and demonstrated an understanding of the instructions.   The patient was advised to call back or seek an in-person evaluation if the symptoms worsen or if the condition fails to improve as anticipated.    Binnie Rail, MD

## 2019-03-13 NOTE — Assessment & Plan Note (Signed)
She is experiencing significant depression She does wish that she was no longer here, but will not commit suicide-more out of fear of messing that up We will be starting Lexapro daily Advised her to update me on how she does with this and how she is feeling

## 2019-03-13 NOTE — Assessment & Plan Note (Signed)
She continues to have diarrhea Stopping Protonix did not help She has had a long history of chronic constipation and is having small stools and incomplete bowel movements She is taking MiraLAX daily and then will experience intermittent diarrhea-approximately 22 episodes in the past 2 months I agree with Dr. Orma Flaming is likely overflow phenomenon secondary to chronic constipation She will try stopping the MiraLAX and starting Metamucil or Benefiber If this is not effective would recommend that she add something like a stool softener or add back MiraLAX Overall need to aggressively treat her constipation, which will in turn help the diarrhea

## 2019-03-27 ENCOUNTER — Other Ambulatory Visit: Payer: Self-pay

## 2019-03-27 ENCOUNTER — Ambulatory Visit (INDEPENDENT_AMBULATORY_CARE_PROVIDER_SITE_OTHER): Payer: Medicare HMO | Admitting: Family

## 2019-03-27 ENCOUNTER — Encounter: Payer: Self-pay | Admitting: Family

## 2019-03-27 DIAGNOSIS — R232 Flushing: Secondary | ICD-10-CM

## 2019-03-27 NOTE — Progress Notes (Signed)
Tamara Robertson is a 85 y.o. female with the following history as recorded in [employer]:  Patient Active Problem List   Diagnosis Date Noted  . Diarrhea 03/13/2019  . Nonrheumatic aortic valve stenosis 02/26/2019  . Lightheadedness 11/19/2018  . Epistaxis 11/19/2018  . 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 Rash 03/25/2018  . Chronic left-sided headaches 01/28/2018  . Depression 12/18/2017  . SBO (small bowel obstruction) ([facility]) 09/20/2017  . Aortic stenosis, moderate 08/22/2017  . CAD (coronary artery disease) 08/21/2017  . Pleural effusion on right   . Streptococcal pneumonia ([facility]) 06/26/2017  . Community acquired pneumonia of right lower lobe of lung 06/25/2017  . Current chronic use of systemic steroids 06/25/2017  . Chronic lower back pain 05/14/2017  . Varicose veins of left lower extremity with complications [id number]  . Bilateral hearing loss 02/01/2017  . Hypertension 07/31/2016  . Leg edema 07/31/2016  . Prediabetes 06/05/2016  . Osteopenia 12/01/2015  . Anxiety 07/19/2015  . Degenerative cervical disc 12/28/2014  . Left ovarian cyst   . Peripheral neuropathy 06/03/2012  . PVD (peripheral vascular disease) ([facility]) 11/23/2010  . Bursitis of hip 10/26/2010  . Hyperlipidemia 06/27/2010  . IMMUNE THROMBOCYTOPENIC PURPURA 06/27/2010  . Unspecified glaucoma 06/27/2010  . GERD 06/27/2010  . Irritable bowel syndrome 06/27/2010  . OVERACTIVE BLADDER 06/27/2010  . OSTEOARTHRITIS, KNEE, RIGHT 06/27/2010  . URINARY INCONTINENCE 06/27/2010    Current Outpatient Medications  Medication Sig Dispense Refill  . acetaminophen (TYLENOL) 500 MG tablet Take 500 mg by mouth every 6 (six) hours as needed.    . Biotin 2500 MCG CAPS Take 1 capsule by mouth daily.     . Calcium-Magnesium-Vitamin D (CITRACAL CALCIUM+D) 600-40-500 MG-MG-UNIT TB24 Take 1 tablet by mouth daily.     . cycloSPORINE (RESTASIS) 0.05 % ophthalmic emulsion 1 drop every 12 (twelve) hours.    . docusate sodium  (COLACE) 100 MG capsule Take 100 mg by mouth 2 (two) times daily.    . dorzolamide-timolol (COSOPT) 22.3-6.8 MG/ML ophthalmic solution Place 1 drop into both eyes 2 (two) times daily. 10 mL 12  . escitalopram (LEXAPRO) 5 MG tablet Take 1 tablet (5 mg total) by mouth daily. 30 tablet 5  . Flaxseed MISC Take 5 mLs by mouth daily. Take 1 tsp daily    . furosemide (LASIX) 20 MG tablet TAKE 1 TABLET BY MOUTH AS NEEDED 90 tablet 2  . Glucos-Chond-Hyal Ac-Ca Fructo (MOVE FREE JOINT HEALTH ADVANCE PO) Take by mouth.    . losartan (COZAAR) 25 MG tablet Take 1 tablet (25 mg total) by mouth 2 (two) times daily. 180 tablet 1  . Multiple Vitamin (MULTIVITAMIN) tablet Take 1 tablet by mouth daily.      . naproxen sodium (ALEVE) 220 MG tablet Take 220 mg by mouth.    . polyethylene glycol (MIRALAX / GLYCOLAX) packet Take 17 g by mouth daily as needed for moderate constipation.     . predniSONE (DELTASONE) 2.5 MG tablet TAKE ONE TABLET BY MOUTH EVERY OTHER DAY. 45 tablet 0  . Probiotic Product (PROBIOTIC DAILY PO) Take by mouth.     No current facility-administered medications for this visit.     Allergies: Aspirin, Sulfa antibiotics, Contrast media [iodinated diagnostic agents], Fluoxetine, and Statins  Past Medical History:  Diagnosis Date  . Anxiety   . CAD (coronary artery disease)    RCA 40% stenosis cath 01/2011  . Cholelithiasis    s/p lap chole 09/2014  . Diverticular stricture [facility]) 2006   [facility]  . DIVERTICULITIS, HX OF   . Diverticulosis   . DYSLIPIDEMIA   . Elevated LFTs   . GERD   . Glaucoma   . Hepatic steatosis   . HOH (hard of hearing)   . Immune thrombocytopenic purpura (HCC)    chronic - baseline 80-100K, on pred  . Irritable bowel syndrome   . Left ovarian cyst dx 01/2013 CT   working with gyn, ?malignant - elevated tumor marker OVA1  . OSTEOARTHRITIS, KNEE, RIGHT   . OSTEOPENIA   . OVERACTIVE BLADDER   . Prolapse of female bladder, acquired 11/2017   . UNSPECIFIED PERIPHERAL VASCULAR DISEASE   . URINARY INCONTINENCE     Past Surgical History:  Procedure Laterality Date  . ABDOMINAL HYSTERECTOMY  1963  . ANGIOPLASTY    . APPENDECTOMY  1956  . CARDIAC CATHETERIZATION    . CATARACT EXTRACTION, BILATERAL  10/2010  . CHOLECYSTECTOMY N/A 09/16/2014   Procedure: LAPAROSCOPIC CHOLECYSTECTOMY ;  Surgeon: Rolm Bookbinder, MD;  Location: [facility];  Service: General;  Laterality: N/A;  . KNEE ARTHROSCOPY Right   . L pop PTA  10/2009   stent  . LAPAROSCOPIC SIGMOID COLECTOMY  10/2005  . SPLENECTOMY  1954  . VARICOSE VEIN SURGERY Right 1962    Family History  Problem Relation Age of Onset  . Coronary artery disease Mother   . Heart attack Mother 83  . Hyperlipidemia Mother   . Hypertension Mother   . Stomach cancer Father   . Hypertension Daughter   . Hyperlipidemia Daughter   . Arthritis Other        parent  . Transient ischemic attack Other        parent  . Colon cancer Neg Hx     Social History   Tobacco Use  . Smoking status: Never Smoker  . Smokeless tobacco: Never Used  Substance Use Topics  . Alcohol use: No    Alcohol/week: 0.0 standard drinks    Comment: rarely    Subjective:    I connected with [name] on 03/27/19 at  2:20 PM EST by a video enabled telemedicine application and verified that I am speaking with the correct person using two identifiers. Provider is in office; patient is at home; patient and provider are only 2 people on video call.    I discussed the limitations of evaluation and management by telemedicine and the availability of in person appointments. The patient expressed understanding and agreed to proceed.   Patient started 2.5 mg of Lexapro almost 2 weeks ago; since that time, she has had 4-5 episodes of face "flushing"; notes that she takes the Lexapro in the morning and episodes of flushing occur at night; very random occurrence- not a daily presentation; episodes are not worsening in  severity; no shortness of breath or difficulty breathing;     Objective:  There were no vitals filed for this visit.  General: Well developed, well nourished, in no acute distress  Skin : Warm and dry.  Head: Normocephalic and atraumatic  Lungs: Respirations unlabored;  Neurologic: Alert and oriented; speech intact; face symmetrical;   Assessment:  1. Facial flushing     Plan:  ? Related to recent start of Lexapro; she will hold the medication for the upcoming weekend and call back with her response; follow-up to be determined- she will call back early next week with her response.   No follow-ups on file.  No orders of the defined types were placed in  this encounter.   Requested Prescriptions    No prescriptions requested or ordered in this encounter

## 2019-03-31 DIAGNOSIS — H04123 Dry eye syndrome of bilateral lacrimal glands: Secondary | ICD-10-CM | POA: Diagnosis not present

## 2019-04-07 DIAGNOSIS — G8929 Other chronic pain: Secondary | ICD-10-CM | POA: Diagnosis not present

## 2019-04-07 DIAGNOSIS — H04129 Dry eye syndrome of unspecified lacrimal gland: Secondary | ICD-10-CM | POA: Diagnosis not present

## 2019-04-07 DIAGNOSIS — I739 Peripheral vascular disease, unspecified: Secondary | ICD-10-CM | POA: Diagnosis not present

## 2019-04-07 DIAGNOSIS — G629 Polyneuropathy, unspecified: Secondary | ICD-10-CM | POA: Diagnosis not present

## 2019-04-07 DIAGNOSIS — K5900 Constipation, unspecified: Secondary | ICD-10-CM | POA: Diagnosis not present

## 2019-04-07 DIAGNOSIS — I10 Essential (primary) hypertension: Secondary | ICD-10-CM | POA: Diagnosis not present

## 2019-04-07 DIAGNOSIS — R69 Illness, unspecified: Secondary | ICD-10-CM | POA: Diagnosis not present

## 2019-04-07 DIAGNOSIS — E663 Overweight: Secondary | ICD-10-CM | POA: Diagnosis not present

## 2019-04-07 DIAGNOSIS — Z008 Encounter for other general examination: Secondary | ICD-10-CM | POA: Diagnosis not present

## 2019-04-07 DIAGNOSIS — D693 Immune thrombocytopenic purpura: Secondary | ICD-10-CM | POA: Diagnosis not present

## 2019-04-07 DIAGNOSIS — H409 Unspecified glaucoma: Secondary | ICD-10-CM | POA: Diagnosis not present

## 2019-04-13 ENCOUNTER — Ambulatory Visit (INDEPENDENT_AMBULATORY_CARE_PROVIDER_SITE_OTHER): Payer: Medicare HMO | Admitting: Internal Medicine

## 2019-04-13 ENCOUNTER — Encounter: Payer: Self-pay | Admitting: Internal Medicine

## 2019-04-13 VITALS — BP 132/62 | HR 84 | Temp 97.4°F | Ht 61.5 in | Wt 144.0 lb

## 2019-04-13 DIAGNOSIS — K5900 Constipation, unspecified: Secondary | ICD-10-CM | POA: Diagnosis not present

## 2019-04-13 DIAGNOSIS — K582 Mixed irritable bowel syndrome: Secondary | ICD-10-CM

## 2019-04-13 DIAGNOSIS — K219 Gastro-esophageal reflux disease without esophagitis: Secondary | ICD-10-CM

## 2019-04-13 MED ORDER — FAMOTIDINE 20 MG PO TABS: 20.0000 mg | ORAL_TABLET | Freq: Two times a day (BID) | ORAL | 3 refills | Status: DC | PRN

## 2019-04-13 NOTE — Progress Notes (Signed)
Subjective:    Patient ID: Tamara Robertson, female    DOB: [birth year], 85 y.o.   MRN: [id number]  HPI Tamara Robertson is an 85 year old female with a history of GERD, IBS with chronic constipation, history of complicated diverticular disease status post sigmoidectomy in 07 for sigmoid stricture, history of small bowel obstruction felt secondary to adhesive disease, PVD, ITP, prior cholecystectomy and splenectomy who is here for follow-up.  I saw her last 2 months ago.  She is here alone today.  She reports that on the whole she is feeling better.  Her reflux is doing well despite being off pantoprazole.  I stop this to see if it was affecting her bowel habits.  She has been following a FODMAP diet and this has been beneficial.  She is using Tums occasionally for breakthrough reflux.  Her bowel habits continue to be erratic.  She reports that she will have constipation and "impaction" for which she will need to use MiraLAX.  She will then have a hard stool followed by several days of diarrhea.  The diarrhea can be urgent and then she will use Imodium.  She tried Metamucil which seemed to help but when she takes it daily she felt more constipated.  She has been using Colace.  She does take magnesium at bedtime for cramps.  She has been using MiraLAX on occasion as well.  No blood in her stool or melena.  Regarding the FODMAP diet while it has helped her reflux she prefers to eat more liberally and foods that she enjoys.  She states "I would love to have a piece of pizza before I die".  She has recently started Lexapro for anxiousness.  Initially it was causing flushing so it was stopped for a while and now she is restarted it at low-dose, 2.5 mg daily  She recently had a pelvic MRI which showed a rather stable ovarian cyst   Review of Systems As per HPI, otherwise negative  Current Medications, Allergies, Past Medical History, Past Surgical History, Family History and Social History were reviewed  in [employer] record.     Objective:   Physical Exam BP 132/62   Pulse 84   Temp (!) 97.4 F (36.3 C)   Ht 5' 1.5" (1.562 m)   Wt 144 lb (65.3 kg)   BMI 26.77 kg/m  Gen: awake, alert, NAD HEENT: anicteric, op clear Abd: soft, NT/ND, +BS throughout Ext: no c/c/e Neuro: nonfocal  CBC    Component Value Date/Time   WBC 10.8 (H) 12/23/2018 1512   WBC 10.6 (H) 11/19/2018 1534   RBC 4.49 12/23/2018 1512   HGB 12.5 12/23/2018 1512   HGB 13.9 04/17/2017 1301   HCT 38.4 12/23/2018 1512   HCT 42.2 04/17/2017 1301   PLT 200 12/23/2018 1512   PLT 206 04/17/2017 1301   MCV 85.5 12/23/2018 1512   MCV 86.7 04/17/2017 1301   MCH 27.8 12/23/2018 1512   MCHC 32.6 12/23/2018 1512   RDW 14.6 12/23/2018 1512   RDW 14.3 04/17/2017 1301   LYMPHSABS 4.1 (H) 12/23/2018 1512   LYMPHSABS 3.5 (H) 04/17/2017 1301   MONOABS 1.1 (H) 12/23/2018 1512   MONOABS 1.2 (H) 04/17/2017 1301   EOSABS 0.2 12/23/2018 1512   EOSABS 0.2 04/17/2017 1301   BASOSABS 0.1 12/23/2018 1512   BASOSABS 0.0 04/17/2017 1301   CMP     Component Value Date/Time   NA 140 11/19/2018 1534   NA 142 02/16/2016 1526  K 4.6 11/19/2018 1534   K 4.3 02/16/2016 1526   CL 102 11/19/2018 1534   CL 105 04/17/2012 1452   CO2 31 11/19/2018 1534   CO2 27 02/16/2016 1526   GLUCOSE 99 11/19/2018 1534   GLUCOSE 184 (H) 02/16/2016 1526   GLUCOSE 122 (H) 04/17/2012 1452   BUN 19 11/19/2018 1534   BUN 14.3 02/16/2016 1526   CREATININE 0.90 11/19/2018 1534   CREATININE 0.8 02/16/2016 1526   CALCIUM 9.5 11/19/2018 1534   CALCIUM 9.5 02/16/2016 1526   PROT 7.8 11/19/2018 1534   PROT 7.4 02/16/2016 1526   ALBUMIN 4.6 11/19/2018 1534   ALBUMIN 3.7 02/16/2016 1526   AST 18 11/19/2018 1534   AST 19 02/16/2016 1526   ALT 12 11/19/2018 1534   ALT 11 02/16/2016 1526   ALKPHOS 77 11/19/2018 1534   ALKPHOS 92 02/16/2016 1526   BILITOT 0.4 11/19/2018 1534   BILITOT 0.33 02/16/2016 1526   GFRNONAA >60  09/22/2017 0416   GFRAA >60 09/22/2017 0416   MRI PELVIS WITHOUT CONTRAST   TECHNIQUE: Multiplanar multisequence MR imaging of the pelvis was performed. No intravenous contrast was administered.   COMPARISON:  Pelvic ultrasound dated 01/06/2019. CT abdomen/pelvis dated 09/21/2015.   FINDINGS: Urinary Tract: Bladder is notable for a cystocele at rest (series 3/image 15).   Bowel: Visualized bowel is unremarkable. Known prior left hemicolectomy, better evaluated on CT.   Vascular/Lymphatic: No evidence of aneurysm.   No suspicious pelvic lymphadenopathy.   Reproductive: Status post hysterectomy. Right ovary is not discretely visualized.   3.6 x 4.2 x 3.3 cm predominantly simple left ovarian cystic lesion (series 5/image 8), with mildly irregular margins and incomplete septations along the inferolateral margin of the lesion (series 5/image 9), but without solid component/mural nodularity. Lesion measured 2.6 x 3.0 x 2.7 cm on prior 2014 CT, indicating mild growth, but favoring a benign etiology.   Other:  No pelvic ascites.   Musculoskeletal: Mild degenerative changes of the lower lumbar spine.   IMPRESSION: 4.2 cm predominantly simple left ovarian cystic lesion, without overt malignant features. Given only mild growth since 2014, this continues to favor a benign etiology. Generally speaking, annual follow-up ultrasound is suggested in a postmenopausal patient. However, given the patient's age, it is unclear that follow-up is clinically warranted.   Cystocele at rest, suggesting global pelvic floor dysfunction status post hysterectomy.     Electronically Signed   By: Charline Bills M.D.   On: 01/25/2019 15:28       Assessment & Plan:  85 year old female with a history of GERD, IBS with chronic constipation, history of complicated diverticular disease status post sigmoidectomy in 07 for sigmoid stricture, history of small bowel obstruction felt secondary to  adhesive disease, PVD, ITP, prior cholecystectomy and splenectomy who is here for follow-up.  1.  GERD/history of epigastric pain --the symptoms have improved with dietary modification though she would like to eat more liberally than the restricted FODMAP diet.  She prefers to be off PPI which I think is very reasonable.  I explained that famotidine can be used on an as-needed basis rather than scheduled.  This may allow for her to liberalize her diet to some degree.  She should still avoid acidic foods, eating late at night, and lying down shortly after eating. --Famotidine 20 mg twice daily as needed  2.  IBS with constipation predominance --the true nature of her bowel habits has been difficult to understand and determine but this is likely due  to the use of stool softeners, laxatives on occasion and antidiarrheals on occasion.  For most of her adult life she has been constipation predominant.  I still feel that she likely is constipation predominant with 3 to 4 days without bowel movement followed by hard stool and loose stool/overflow thereafter before the cycle resets or before she takes loperamide.  We discussed colonoscopy today which I think medically she could handle, but she wishes to avoid this over prior issues with bowel preparation.  I recommended the following --Metamucil 1 heaping tablespoon Monday Wednesday and Friday --MiraLAX half dose daily --Stop Colace --Avoid loperamide and less diarrhea is limiting activities of daily living  Follow-up in 3 months  25 minutes spent with the patient today. Greater than 50% was spent in counseling and coordination of care with the patient

## 2019-04-13 NOTE — Patient Instructions (Signed)
We have sent the following medications to your pharmacy for you to pick up at your convenience: pepcid 20 mg twice daily as needed  Please purchase the following medications over the counter and take as directed: Metamucil-- 1 heaping teaspoon on Monday, Wednesday, Friday of each week Miralax-9 grams (1/2 capful) dissolved in at least 8 ounces water/juice once daily Liquid imodium-only use AS NEEDED for severe diarrhea.  Discontinue colace.  Please follow up with Dr Hilarie Fredrickson in 3 months.  If you are age 6 or older, your body mass index should be between 23-30. Your Body mass index is 26.77 kg/m. If this is out of the aforementioned range listed, please consider follow up with your Primary Care Provider.  If you are age 25 or younger, your body mass index should be between 19-25. Your Body mass index is 26.77 kg/m. If this is out of the aformentioned range listed, please consider follow up with your Primary Care Provider.

## 2019-04-15 ENCOUNTER — Ambulatory Visit (INDEPENDENT_AMBULATORY_CARE_PROVIDER_SITE_OTHER)
Admission: RE | Admit: 2019-04-15 | Discharge: 2019-04-15 | Disposition: A | Discharge status: Home / Self Care | Payer: Medicare HMO | Source: Ambulatory Visit | Attending: Internal Medicine | Admitting: Internal Medicine

## 2019-04-15 ENCOUNTER — Other Ambulatory Visit: Payer: Self-pay

## 2019-04-15 DIAGNOSIS — M8589 Other specified disorders of bone density and structure, multiple sites: Secondary | ICD-10-CM | POA: Diagnosis not present

## 2019-04-20 DIAGNOSIS — L219 Seborrheic dermatitis, unspecified: Secondary | ICD-10-CM | POA: Diagnosis not present

## 2019-04-20 DIAGNOSIS — S41111A Laceration without foreign body of right upper arm, initial encounter: Secondary | ICD-10-CM | POA: Diagnosis not present

## 2019-04-20 DIAGNOSIS — L820 Inflamed seborrheic keratosis: Secondary | ICD-10-CM | POA: Diagnosis not present

## 2019-04-22 ENCOUNTER — Encounter: Payer: Self-pay | Admitting: Internal Medicine

## 2019-04-26 NOTE — Progress Notes (Signed)
Subjective:    Patient ID: Tamara Robertson, female    DOB: [birth year], 85 y.o.   MRN: [id number]  HPI The patient is here for an acute visit.   Elevated BP:  She is taking her medication daily. She is compliant with a low sodium diet.  She does monitor her blood pressure at home - 161/75, 147/73, 154/78, 126/63, 125/80.   Her BP is high late at night only.   She sometimes feels flushed when her blood pressure is elevated.  Her blood pressure tends to be good during the day.  She does have sensation in her head, possible mild headache when her blood pressure is elevated.  She has not had any chest pain or palpitations.  Depression: She is taking the Lexapro daily.  She is tolerating the medication and feels the medication has helped.   Medications and allergies reviewed with patient and updated if appropriate.  Patient Active Problem List   Diagnosis Date Noted  . Diarrhea 03/13/2019  . Nonrheumatic aortic valve stenosis 02/26/2019  . Lightheadedness 11/19/2018  . Epistaxis 11/19/2018  . 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 Rash 03/25/2018  . Chronic left-sided headaches 01/28/2018  . Depression 12/18/2017  . SBO (small bowel obstruction) (HCC) 09/20/2017  . Aortic stenosis, moderate 08/22/2017  . CAD (coronary artery disease) 08/21/2017  . Pleural effusion on right   . Streptococcal pneumonia (HCC) 06/26/2017  . Community acquired pneumonia of right lower lobe of lung 06/25/2017  . Current chronic use of systemic steroids 06/25/2017  . Chronic lower back pain 05/14/2017  . Varicose veins of left lower extremity with complications 03/05/2017  . Bilateral hearing loss 02/01/2017  . Hypertension 07/31/2016  . Leg edema 07/31/2016  . Prediabetes 06/05/2016  . Osteopenia 12/01/2015  . Anxiety 07/19/2015  . Degenerative cervical disc 12/28/2014  . Left ovarian cyst   . Peripheral neuropathy 06/03/2012  . PVD (peripheral vascular disease) (HCC) 11/23/2010  . Bursitis of  hip 10/26/2010  . Hyperlipidemia 06/27/2010  . IMMUNE THROMBOCYTOPENIC PURPURA 06/27/2010  . Unspecified glaucoma 06/27/2010  . GERD 06/27/2010  . Irritable bowel syndrome 06/27/2010  . OVERACTIVE BLADDER 06/27/2010  . OSTEOARTHRITIS, KNEE, RIGHT 06/27/2010  . URINARY INCONTINENCE 06/27/2010    Current Outpatient Medications on File Prior to Visit  Medication Sig Dispense Refill  . acetaminophen (TYLENOL) 500 MG tablet Take 500 mg by mouth every 6 (six) hours as needed.    . Biotin 2500 MCG CAPS Take 1 capsule by mouth daily.     . calcium carbonate (TUMS - DOSED IN MG ELEMENTAL CALCIUM) 500 MG chewable tablet Chew 1 tablet by mouth daily as needed for indigestion or heartburn.    . Calcium-Magnesium-Vitamin D (CITRACAL CALCIUM+D) 600-40-500 MG-MG-UNIT TB24 Take 1 tablet by mouth daily.     . cycloSPORINE (RESTASIS) 0.05 % ophthalmic emulsion 1 drop every 12 (twelve) hours.    . docusate sodium (COLACE) 100 MG capsule Take 100 mg by mouth at bedtime.     . dorzolamide-timolol (COSOPT) 22.3-6.8 MG/ML ophthalmic solution Place 1 drop into both eyes 2 (two) times daily. 10 mL 12  . famotidine (PEPCID) 20 MG tablet Take 1 tablet (20 mg total) by mouth 2 (two) times daily as needed for heartburn or indigestion. 60 tablet 3  . Flaxseed MISC Take 5 mLs by mouth daily. Take 1 tsp daily    . furosemide (LASIX) 20 MG tablet TAKE 1 TABLET BY MOUTH AS NEEDED 90 tablet 2  . Glucos-Chond-Hyal Ac-Ca Fructo (MOVE  FREE JOINT HEALTH ADVANCE PO) Take by mouth.    . losartan (COZAAR) 25 MG tablet Take 1 tablet (25 mg total) by mouth 2 (two) times daily. 180 tablet 1  . Multiple Vitamin (MULTIVITAMIN) tablet Take 1 tablet by mouth daily.      . naproxen sodium (ALEVE) 220 MG tablet Take 220 mg by mouth as needed.     . polyethylene glycol (MIRALAX / GLYCOLAX) packet Take 17 g by mouth daily as needed for moderate constipation.     . predniSONE (DELTASONE) 2.5 MG tablet TAKE ONE TABLET BY MOUTH EVERY OTHER  DAY. 45 tablet 0  . Probiotic Product (PROBIOTIC DAILY PO) Take by mouth.    . psyllium (METAMUCIL) 58.6 % packet Take 1 packet by mouth daily as needed.     No current facility-administered medications on file prior to visit.    Past Medical History:  Diagnosis Date  . Anxiety   . CAD (coronary artery disease)    RCA 40% stenosis cath 01/2011  . Cholelithiasis    s/p lap chole 09/2014  . Diverticular stricture [facility]) 2006   [facility]  . DIVERTICULITIS, HX OF   . Diverticulosis   . DYSLIPIDEMIA   . Elevated LFTs   . GERD   . Glaucoma   . Hepatic steatosis   . HOH (hard of hearing)   . Immune thrombocytopenic purpura (HCC)    chronic - baseline 80-100K, on pred  . Irritable bowel syndrome   . Left ovarian cyst dx 01/2013 CT   working with gyn, ?malignant - elevated tumor marker OVA1  . OSTEOARTHRITIS, KNEE, RIGHT   . OSTEOPENIA   . OVERACTIVE BLADDER   . Prolapse of female bladder, acquired 11/2017  . UNSPECIFIED PERIPHERAL VASCULAR DISEASE   . URINARY INCONTINENCE     Past Surgical History:  Procedure Laterality Date  . ABDOMINAL HYSTERECTOMY  1963  . ANGIOPLASTY    . APPENDECTOMY  1956  . CARDIAC CATHETERIZATION    . CATARACT EXTRACTION, BILATERAL  10/2010  . CHOLECYSTECTOMY N/A 09/16/2014   Procedure: LAPAROSCOPIC CHOLECYSTECTOMY ;  Surgeon: Emelia Loron, MD;  Location: [facility] OR;  Service: General;  Laterality: N/A;  . KNEE ARTHROSCOPY Right   . L pop PTA  10/2009   stent  . LAPAROSCOPIC SIGMOID COLECTOMY  10/2005  . SPLENECTOMY  1954  . VARICOSE VEIN SURGERY Right 1962    Social History   Socioeconomic History  . Marital status: Widowed    Spouse name: Not on file  . Number of children: 2  . Years of education: Not on file  . Highest education level: Not on file  Occupational History  . Occupation: Retired    Comment: Programmer, multimedia  Tobacco Use  . Smoking status: Never Smoker  . Smokeless tobacco: Never Used  Substance  and Sexual Activity  . Alcohol use: No    Alcohol/week: 0.0 standard drinks    Comment: rarely  . Drug use: No  . Sexual activity: Not Currently    Comment: 1st intercourse- 19, partners- 2,  widow  Other Topics Concern  . 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    [place] to [place] from [place]  05/2010 to be close to kids   Social Determinants of Health   Financial Resource Strain:   . Difficulty of Paying Living Expenses: Not on file  Food Insecurity:   . Worried About [employer] of  Food in the Last Year: Not on file  . Ran Out of Food in the Last Year: Not on file  Transportation Needs:   . Lack of Transportation (Medical): Not on file  . Lack of Transportation (Non-Medical): Not on file  Physical Activity:   . Days of Exercise per Week: Not on file  . Minutes of Exercise per Session: Not on file  Stress:   . Feeling of Stress : Not on file  Social Connections:   . Frequency of Communication with Friends and Family: Not on file  . Frequency of Social Gatherings with Friends and Family: Not on file  . Attends Religious Services: Not on file  . Active Member of Clubs or Organizations: Not on file  . Attends Banker Meetings: Not on file  . Marital Status: Not on file    Family History  Problem Relation Age of Onset  . Coronary artery disease Mother   . Heart attack Mother 47  . Hyperlipidemia Mother   . Hypertension Mother   . Stomach cancer Father   . Hypertension Daughter   . Hyperlipidemia Daughter   . Arthritis Other        parent  . Transient ischemic attack Other        parent  . Colon cancer Neg Hx     Review of Systems  Constitutional:       Flushed feeling with elevated BP  Respiratory: Negative for shortness of breath.   Cardiovascular: Negative for chest pain, palpitations and leg swelling.  Neurological: Positive for headaches (with high BP). Negative for light-headedness.        Objective:   Vitals:   04/27/19 1300  BP: (!) 160/82  Pulse: 76  Resp: 14  Temp: 98.1 F (36.7 C)  SpO2: 99%   BP Readings from Last 3 Encounters:  04/27/19 (!) 160/82  04/13/19 132/62  03/06/19 140/68   Wt Readings from Last 3 Encounters:  04/27/19 138 lb 6.4 oz (62.8 kg)  04/13/19 144 lb (65.3 kg)  03/06/19 145 lb (65.8 kg)   Body mass index is 25.73 kg/m.   Physical Exam    Constitutional: Appears well-developed and well-nourished. No distress.  HENT:  Head: Normocephalic and atraumatic.  Neck: Neck supple. No tracheal deviation present. No thyromegaly present.  No cervical lymphadenopathy Cardiovascular: Normal rate, regular rhythm and normal heart sounds.  2/6 sys murmur heard. No carotid bruit .  No edema Pulmonary/Chest: Effort normal and breath sounds normal. No respiratory distress. No has no wheezes. No rales.  Skin: Skin is warm and dry. Not diaphoretic.  Psychiatric: Normal mood and affect. Behavior is normal.       Assessment & Plan:    See Problem List for Assessment and Plan of chronic medical problems.     This visit occurred during the SARS-CoV-2 public health emergency.  Safety protocols were in place, including screening questions prior to the visit, additional usage of staff PPE, and extensive cleaning of exam room while observing appropriate contact time as indicated for disinfecting solutions.

## 2019-04-27 ENCOUNTER — Encounter: Payer: Self-pay | Admitting: Internal Medicine

## 2019-04-27 ENCOUNTER — Other Ambulatory Visit: Payer: Self-pay

## 2019-04-27 ENCOUNTER — Ambulatory Visit (INDEPENDENT_AMBULATORY_CARE_PROVIDER_SITE_OTHER): Payer: Medicare HMO | Admitting: Internal Medicine

## 2019-04-27 DIAGNOSIS — I10 Essential (primary) hypertension: Secondary | ICD-10-CM

## 2019-04-27 DIAGNOSIS — F3289 Other specified depressive episodes: Secondary | ICD-10-CM

## 2019-04-27 DIAGNOSIS — R69 Illness, unspecified: Secondary | ICD-10-CM | POA: Diagnosis not present

## 2019-04-27 MED ORDER — ESCITALOPRAM OXALATE 5 MG PO TABS: 5.0000 mg | ORAL_TABLET | Freq: Every day | ORAL | 5 refills | Status: DC

## 2019-04-27 NOTE — Assessment & Plan Note (Signed)
Taking Lexapro 2.5 mg daily, which has been well-tolerated and very effective Continue

## 2019-04-27 NOTE — Assessment & Plan Note (Addendum)
Blood pressure elevated here today and has been elevated at home-typically high later at night-not well controlled Currently taking losartan 25 mg twice daily Will increase to 1-1/2 pills at nighttime-discussed that we will need to try different things to see what works best for her-we do not want her blood pressure to be too low, but better controlled in the evening She will update me via MyChart with her readings from home-she will try her different blood pressure cuff that is likely more accurate

## 2019-04-27 NOTE — Patient Instructions (Addendum)
Try taking 1 1/2 pills of the losartan at night.  Continue one losartan in the morning.     Monitor your BP at home and let me know your readings.

## 2019-04-28 ENCOUNTER — Other Ambulatory Visit: Payer: Self-pay | Admitting: Internal Medicine

## 2019-05-07 ENCOUNTER — Telehealth: Payer: Self-pay

## 2019-05-07 NOTE — Telephone Encounter (Signed)
Pt called to report has had high BP. Please call pt.

## 2019-05-07 NOTE — Telephone Encounter (Signed)
Pt states Bp was 163/77 AM and was 173/79 at 1am when she went to bed. Please call pt to advise.

## 2019-05-11 MED ORDER — LOSARTAN POTASSIUM 25 MG PO TABS: ORAL_TABLET | ORAL | 1 refills | Status: DC

## 2019-05-11 NOTE — Telephone Encounter (Signed)
Have her take one losartan in am and two in evening to see if that helps the bp -- should call us with BP numbers in a week or so.

## 2019-05-11 NOTE — Telephone Encounter (Signed)
Pt aware of response. Wondered if the increase in her BP could have anything to do with her lexapro. She started it about 4 weeks ago. She states it does help her when she takes it but she has been taking 1/4 of a pill because she did not want get completely off of it without talking to you. She called pharmacist and they told her that lexapro and furosemide had an interaction of low sodium. She wanted to make sure that was ok as well.

## 2019-05-11 NOTE — Telephone Encounter (Signed)
Per patient, sent response through my chart.

## 2019-05-11 NOTE — Telephone Encounter (Signed)
The Lexapro could potentially increase the blood pressure.  She is on such a low dose I am not concerned about her sodium level.  The only way to know if the Lexapro is affecting the blood pressure is to stop it, which we can try, but medication has really helped and I think she benefits from being on it.

## 2019-05-13 ENCOUNTER — Encounter: Payer: Self-pay | Admitting: Podiatry

## 2019-05-13 ENCOUNTER — Other Ambulatory Visit: Payer: Self-pay

## 2019-05-13 ENCOUNTER — Ambulatory Visit: Payer: Medicare HMO | Admitting: Podiatry

## 2019-05-13 VITALS — BP 148/77 | HR 75

## 2019-05-13 DIAGNOSIS — M79675 Pain in left toe(s): Secondary | ICD-10-CM

## 2019-05-13 DIAGNOSIS — B351 Tinea unguium: Secondary | ICD-10-CM

## 2019-05-13 DIAGNOSIS — M2041 Other hammer toe(s) (acquired), right foot: Secondary | ICD-10-CM | POA: Diagnosis not present

## 2019-05-13 DIAGNOSIS — L600 Ingrowing nail: Secondary | ICD-10-CM | POA: Diagnosis not present

## 2019-05-13 DIAGNOSIS — M2042 Other hammer toe(s) (acquired), left foot: Secondary | ICD-10-CM

## 2019-05-13 DIAGNOSIS — M79674 Pain in right toe(s): Secondary | ICD-10-CM | POA: Diagnosis not present

## 2019-05-13 NOTE — Patient Instructions (Signed)

## 2019-05-14 ENCOUNTER — Encounter: Payer: Self-pay | Admitting: Internal Medicine

## 2019-05-17 NOTE — Progress Notes (Signed)
Subjective: Tamara Robertson presents today referred by Tamara Rail, MD with cc of painful, discolored, thick toenails which interfere with daily activities.  Pain is aggravated when wearing enclosed shoe gear.  She states the nails of both great toes feel ingrown.    Tamara Robertson has seen Dr. Valentina Robertson several years ago.  Past Medical History:  Diagnosis Date  . Anxiety   . CAD (coronary artery disease)    RCA 40% stenosis cath 01/2011  . Cholelithiasis    s/p lap chole 09/2014  . Diverticular stricture [facility]) 2006   [facility]  . DIVERTICULITIS, HX OF   . Diverticulosis   . DYSLIPIDEMIA   . Elevated LFTs   . GERD   . Glaucoma   . Hepatic steatosis   . HOH (hard of hearing)   . Immune thrombocytopenic purpura (HCC)    chronic - baseline 80-100K, on pred  . Irritable bowel syndrome   . Left ovarian cyst dx 01/2013 CT   working with gyn, ?malignant - elevated tumor marker OVA1  . OSTEOARTHRITIS, KNEE, RIGHT   . OSTEOPENIA   . OVERACTIVE BLADDER   . Prolapse of female bladder, acquired 11/2017  . UNSPECIFIED PERIPHERAL VASCULAR DISEASE   . URINARY INCONTINENCE      Patient Active Problem List   Diagnosis Date Noted  . Diarrhea 03/13/2019  . Nonrheumatic aortic valve stenosis 02/26/2019  . Lightheadedness 11/19/2018  . Epistaxis 11/19/2018  . 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 Rash 03/25/2018  . Chronic left-sided headaches 01/28/2018  . Depression 12/18/2017  . SBO (small bowel obstruction) ([place]) 09/20/2017  . Aortic stenosis, moderate 08/22/2017  . CAD (coronary artery disease) 08/21/2017  . Pleural effusion on right   . Streptococcal pneumonia ([place]) 06/26/2017  . Community acquired pneumonia of right lower lobe of lung 06/25/2017  . Current chronic use of systemic steroids 06/25/2017  . Chronic lower back pain 05/14/2017  . Varicose veins of left lower extremity with complications [id number]  . Bilateral hearing loss 02/01/2017  .  Hypertension 07/31/2016  . Leg edema 07/31/2016  . Prediabetes 06/05/2016  . Osteopenia 12/01/2015  . Anxiety 07/19/2015  . Degenerative cervical disc 12/28/2014  . Left ovarian cyst   . Peripheral neuropathy 06/03/2012  . PVD (peripheral vascular disease) ([place]) 11/23/2010  . Bursitis of hip 10/26/2010  . Hyperlipidemia 06/27/2010  . IMMUNE THROMBOCYTOPENIC PURPURA 06/27/2010  . Unspecified glaucoma 06/27/2010  . GERD 06/27/2010  . Irritable bowel syndrome 06/27/2010  . OVERACTIVE BLADDER 06/27/2010  . OSTEOARTHRITIS, KNEE, RIGHT 06/27/2010  . URINARY INCONTINENCE 06/27/2010     Past Surgical History:  Procedure Laterality Date  . ABDOMINAL HYSTERECTOMY  1963  . ANGIOPLASTY    . APPENDECTOMY  1956  . CARDIAC CATHETERIZATION    . CATARACT EXTRACTION, BILATERAL  10/2010  . CHOLECYSTECTOMY N/A 09/16/2014   Procedure: LAPAROSCOPIC CHOLECYSTECTOMY ;  Surgeon: Tamara Bookbinder, MD;  Location: [place];  Service: General;  Laterality: N/A;  . KNEE ARTHROSCOPY Right   . L pop PTA  10/2009   stent  . LAPAROSCOPIC SIGMOID COLECTOMY  10/2005  . SPLENECTOMY  1954  . VARICOSE VEIN SURGERY Right 1962     Current Outpatient Medications on File Prior to Visit  Medication Sig Dispense Refill  . estrogens, conjugated, (PREMARIN) 0.3 MG tablet Take by mouth.    [name] acetaminophen (TYLENOL) 500 MG tablet Take 500 mg by mouth every 6 (six) hours as needed.    [name] ascorbic acid (VITAMIN C) 500 MG  tablet Take by mouth.    . Biotin 2500 MCG CAPS Take 1 capsule by mouth daily.     . calcium carbonate (TUMS - DOSED IN MG ELEMENTAL CALCIUM) 500 MG chewable tablet Chew 1 tablet by mouth daily as needed for indigestion or heartburn.    . Calcium-Magnesium-Vitamin D (CITRACAL CALCIUM+D) 600-40-500 MG-MG-UNIT TB24 Take 1 tablet by mouth daily.     . cycloSPORINE (RESTASIS) 0.05 % ophthalmic emulsion 1 drop every 12 (twelve) hours.    . docusate sodium (COLACE) 100 MG capsule Take 100 mg by mouth at bedtime.      . dorzolamide-timolol (COSOPT) 22.3-6.8 MG/ML ophthalmic solution Place 1 drop into both eyes 2 (two) times daily. 10 mL 12  . escitalopram (LEXAPRO) 5 MG tablet Take 1 tablet (5 mg total) by mouth daily. 30 tablet 5  . famotidine (PEPCID) 20 MG tablet Take 1 tablet (20 mg total) by mouth 2 (two) times daily as needed for heartburn or indigestion. 60 tablet 3  . Flaxseed MISC Take 5 mLs by mouth daily. Take 1 tsp daily    . furosemide (LASIX) 20 MG tablet TAKE 1 TABLET BY MOUTH AS NEEDED 90 tablet 2  . Glucos-Chond-Hyal Ac-Ca Fructo (MOVE FREE JOINT HEALTH ADVANCE PO) Take by mouth.    . latanoprost (XALATAN) 0.005 % ophthalmic solution Apply to eye.    . losartan (COZAAR) 25 MG tablet Take one tab in am and two tabs in pm 270 tablet 1  . Multiple Vitamin (MULTIVITAMIN) tablet Take 1 tablet by mouth daily.      . naproxen sodium (ALEVE) 220 MG tablet Take 220 mg by mouth as needed.     . polyethylene glycol (MIRALAX / GLYCOLAX) packet Take 17 g by mouth daily as needed for moderate constipation.     . predniSONE (DELTASONE) 2.5 MG tablet TAKE ONE TABLET BY MOUTH EVERY OTHER DAY. 45 tablet 0  . Probiotic Product (PROBIOTIC DAILY PO) Take by mouth.    . psyllium (METAMUCIL) 58.6 % packet Take 1 packet by mouth daily as needed.     No current facility-administered medications on file prior to visit.     Allergies  Allergen Reactions  . Aspirin Other (See Comments)    ITP  . Sulfa Antibiotics Other (See Comments)    dizziness  . Contrast Media [Iodinated Diagnostic Agents] Hives    CAT scan contrast only  . Fluoxetine     Did not feel well on it   . Statins Other (See Comments)     Social History   Occupational History  . Occupation: Retired    Comment: Retail banker  Tobacco Use  . Smoking status: Never Smoker  . Smokeless tobacco: Never Used  Substance and Sexual Activity  . Alcohol use: No    Alcohol/week: 0.0 standard drinks    Comment: rarely  . Drug use: No   . Sexual activity: Not Currently    Comment: 1st intercourse- 19, partners- 2,  widow     Family History  Problem Relation Age of Onset  . Coronary artery disease Mother   . Heart attack Mother 95  . Hyperlipidemia Mother   . Hypertension Mother   . Stomach cancer Father   . Hypertension Daughter   . Hyperlipidemia Daughter   . Arthritis Other        parent  . Transient ischemic attack Other        parent  . Colon cancer Neg Hx  Immunization History  Administered Date(s) Administered  . Fluad Quad(high Dose 65+) 12/31/2018  . Influenza Split 02/04/2014, 02/14/2015  . Influenza-Unspecified 02/12/2013, 02/06/2016, 02/04/2017, 01/10/2018  . Pneumococcal Conjugate-13 08/24/2014  . Pneumococcal Polysaccharide-23 02/28/2011  . Td 02/28/2011  . Zoster 04/07/2007  . Zoster Recombinat (Shingrix) 05/23/2017, 10/25/2017     Review of systems: Positive Findings in bold print.  Constitutional:  chills, fatigue, fever, sweats, weight change Communication: Optometrist, sign Ecologist, hand writing, iPad/Android device Head: headaches, head injury Eyes: changes in vision, eye pain, glaucoma, cataracts, macular degeneration, diplopia, glare,  light sensitivity, eyeglasses or contacts, blindness Ears nose mouth throat: hearing impaired, hearing aids,  ringing in ears, deaf, sign language,  vertigo,   nosebleeds,  rhinitis,  cold sores, snoring, swollen glands Cardiovascular: HTN, edema, arrhythmia, pacemaker in place, defibrillator in place, chest pain/tightness, chronic anticoagulation, blood clot, heart failure, MI Peripheral Vascular: leg cramps, varicose veins, blood clots, lymphedema, varicosities Respiratory:  difficulty breathing, denies congestion, SOB, wheezing, cough, emphysema Gastrointestinal: change in appetite or weight, abdominal pain, constipation, diarrhea, nausea, vomiting, vomiting blood, change in bowel habits, abdominal pain, jaundice, rectal bleeding,  hemorrhoids, GERD Genitourinary:  nocturia,  pain on urination, polyuria,  blood in urine, Foley catheter, urinary urgency, ESRD on hemodialysis Musculoskeletal: amputation, cramping, stiff joints, painful joints, decreased joint motion, fractures, OA, gout, hemiplegia, paraplegia, uses cane, wheelchair bound, uses walker, uses rollator Skin: +changes in toenails, color change, dryness, itching, mole changes,  rash, wound(s) Neurological: headaches, numbness in feet, paresthesias in feet, burning in feet, fainting,  seizures, change in speech. denies headaches, memory problems/poor historian, cerebral palsy, weakness, paralysis, CVA, TIA Endocrine: diabetes, hypothyroidism, hyperthyroidism,  goiter, dry mouth, flushing, heat intolerance,  cold intolerance,  excessive thirst, denies polyuria,  nocturia Hematological:  easy bleeding, excessive bleeding, easy bruising, enlarged lymph nodes, on long term blood thinner, history of past transusions Allergy/immunological:  hives, eczema, frequent infections, multiple drug allergies, seasonal allergies, transplant recipient, multiple food allergies Psychiatric:  anxiety, depression, mood disorder, suicidal ideations, hallucinations, insomnia  Objective: Vitals:   05/13/19 1512  BP: (!) 148/77  Pulse: 75   Vascular Examination: Capillary refill time immediate x 10 digits.  Dorsalis pedis pulses faintly palpable b/l.   Posterior tibial pulses faintly palpable b/l.   Digital hair present b/l.  Skin temperature gradient WNL b/l.  Dermatological Examination: Skin with normal turgor, texture and tone b/l.  Toenails 1-5 b/l discolored, thick, dystrophic with subungual debris and pain with palpation to nailbeds due to thickness of nails.  Incurvated nailplate b/l great toes with tenderness to palpation. No erythema, no edema, no drainage noted.  Musculoskeletal: Muscle strength 5/5 to all LE muscle groups b/l.   Hammertoe right 2nd digit.  No  pain or crepitus of joints with passive/active ROM b/l.   Neurological: Sensation intact 5/5 b/l with 10 gram monofilament.  Vibratory sensation intact b/l.   Assessment: 1. Painful onychomycosis toenails 1-5 b/l   Plan: 1. Discussed onychomycosis and treatment options.  Literature dispensed on today. 2. Toenails 1-5 b/l were debrided in length and girth without iatrogenic bleeding. Offending nail borders debrided and curretaged b/l great toes. Borders cleansed with alcohol. Antibiotic ointment applied. No further treatment required by patient. 3. Patient to continue soft, supportive shoe gear daily. 4. Patient to report any pedal injuries to medical professional immediately. 5. Follow up 3 months.  6. Patient/POA to call should there be a concern in the interim.

## 2019-05-19 ENCOUNTER — Other Ambulatory Visit: Payer: Self-pay | Admitting: Internal Medicine

## 2019-05-22 ENCOUNTER — Ambulatory Visit: Payer: Self-pay | Admitting: *Deleted

## 2019-05-22 NOTE — Telephone Encounter (Signed)
Pt called in to give a report on her BP readings and how she is feeling to Dr. Quay Burow per her instructions.  She is only taking the one pill in the morning.  Monday at [facility] my BP was 163/76 other than that my BP is staying in the 116/70s range and "I'm not feeling wobbly at all now".   "Before when my BP would be that low I would feel wobbly but I've been feeling fine".     She mentioned her pulse is usually in the 71-77 range.  Last night she noticed it was 84 and today it's 87.  Denies shortness of breath, chest discomfort or "feeling wobbly" when I asked her.   I explained our pulse rate fluctuates normally and that her pulse was not out of line especially since she is not feeling wobbly now.   "That's good I just wanted to be sure that was alright."  I let her know I would pass this report along to Dr. Quay Burow for her.   She is going to continue monitoring her BP.    I sent my notes to Dr. Quay Burow' office.    Reason for Disposition . [1] Skipped or extra beat(s) AND [2] occurs < 4 times / minute    She is calling in per Dr. Quay Burow instructions to give a report on her BP reading, how she's been feeling and questioned her pulse rate in the 80's.  No triage.  Answer Assessment - Initial Assessment Questions 1. DESCRIPTION: "Please describe your heart rate or heart beat that you are having" (e.g., fast/slow, regular/irregular, skipped or extra beats, "palpitations")     I'm taking my BP because Dr. Quay Burow told me to monitor it for a week.   My pulse is 71-77 normally.   Last night it was 84.   Today it's 87.    BP 163/76 Monday at [place].   116/73 in about that area.   I'm only taking my BP pill in the morning now per what Dr. Quay Burow told me.     BP 116/70 I'm not wobbly because normally I am.  I'm feeling fine.   Denies shortness of breath or chest discomfort.  2. ONSET: "When did it start?" (Minutes, hours or days)      *No Answer* 3. DURATION: "How long does it last" (e.g., seconds, minutes, hours)  *No Answer* 4. PATTERN "Does it come and go, or has it been constant since it started?"  "Does it get worse with exertion?"   "Are you feeling it now?"     *No Answer* 5. TAP: "Using your hand, can you tap out what you are feeling on a chair or table in front of you, so that I can hear?" (Note: not all patients can do this)       *No Answer* 6. HEART RATE: "Can you tell me your heart rate?" "How many beats in 15 seconds?"  (Note: not all patients can do this)       *No Answer* 7. RECURRENT SYMPTOM: "Have you ever had this before?" If so, ask: "When was the last time?" and "What happened that time?"      *No Answer* 8. CAUSE: "What do you think is causing the palpitations?"     *No Answer* 9. CARDIAC HISTORY: "Do you have any history of heart disease?" (e.g., heart attack, angina, bypass surgery, angioplasty, arrhythmia)      *No Answer* 10. OTHER SYMPTOMS: "Do you have any other symptoms?" (e.g., dizziness, chest pain,  sweating, difficulty breathing)       *No Answer* 11. PREGNANCY: "Is there any chance you are pregnant?" "When was your last menstrual period?"       *No Answer*  Protocols used: HEART RATE AND HEARTBEAT QUESTIONS-A-AH

## 2019-05-23 MED ORDER — LOSARTAN POTASSIUM 25 MG PO TABS: 25.0000 mg | ORAL_TABLET | Freq: Every day | ORAL | 1 refills | Status: DC

## 2019-05-23 NOTE — Addendum Note (Signed)
Addended by: Binnie Rail on: 05/23/2019 09:46 AM   Modules accepted: Orders

## 2019-05-23 NOTE — Telephone Encounter (Signed)
noted 

## 2019-05-26 ENCOUNTER — Telehealth: Payer: Self-pay

## 2019-05-26 NOTE — Telephone Encounter (Signed)
Pt will keep track of BP until Thursday and will call back with readings.

## 2019-05-26 NOTE — Telephone Encounter (Signed)
Copied from [employer]. Topic: General - Inquiry >> May 22, 2019  1:55 PM Mathis Bud wrote: Reason for CRM: Patient is requesting Lovena Le to call back to report her BP.  Patient states she was suppose to call in and let PCP know. Call back [phone]

## 2019-05-28 NOTE — Progress Notes (Signed)
Virtual Visit via Video Note  I connected with Tamara Robertson on 05/29/19 at  1:30 PM EST by a video enabled telemedicine application and verified that I am speaking with the correct person using two identifiers.   I discussed the limitations of evaluation and management by telemedicine and the availability of in person appointments. The patient expressed understanding and agreed to proceed.  Present for the visit:  Myself, Dr Billey Gosling, [name].  The patient is currently at home and I am in the office.    No referring provider.    History of Present Illness: This is an acute visit for low BP.  Since stopping the Lexapro her blood pressure has slowly decreased.  She is currently taking losartan 25 mg once daily.  She tends to take it in the morning.  In the middle of the day and afternoon her blood pressure is low-normal.  She has gone 0000000 as her systolic.  Her diastolic has always been within normal range.  This afternoon her SBP was 118 and later 123.  Sometimes she will take her blood pressure at midnight because she is a night owl and it will be higher.  She does feel a creepy crawly feeling in her head when her blood pressure is on the lower side.  2 years ago she had a severe pneumonia and since then she feels like she has been dealing with her blood pressure.  She is unsure if she needs medication or not.  Starting the Lexapro definitely increased her blood pressure.   Anxiety, depression: She states she would consider trying another medication since being off Lexapro because that did help.  She would like to figure out her blood pressure first before trying anything else in case there are side effects.  Her heartburn is well controlled with Pepcid and Tums as needed.  Her diarrhea/constipation is also controlled.    Social History   Socioeconomic History  . Marital status: Widowed    Spouse name: Not on file  . Number of children: 2  . Years of education: Not  on file  . Highest education level: Not on file  Occupational History  . Occupation: Retired    Comment: Retail banker  Tobacco Use  . Smoking status: Never Smoker  . Smokeless tobacco: Never Used  Substance and Sexual Activity  . Alcohol use: No    Alcohol/week: 0.0 standard drinks    Comment: rarely  . Drug use: No  . Sexual activity: Not Currently    Comment: 1st intercourse- 19, partners- 2,  widow  Other Topics Concern  . 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    Dorie Rank to [place] from [place] 05/2010 to be close to kids   Social Determinants of Health   Financial Resource Strain:   . Difficulty of Paying Living Expenses: Not on file  Food Insecurity:   . Worried About Charity fundraiser in the Last Year: Not on file  . Ran Out of Food in the Last Year: Not on file  Transportation Needs:   . Lack of Transportation (Medical): Not on file  . Lack of Transportation (Non-Medical): Not on file  Physical Activity:   . Days of Exercise per Week: Not on file  . Minutes of Exercise per Session: Not on file  Stress:   . Feeling of Stress : Not on file  Social Connections:   . Frequency  of Communication with Friends and Family: Not on file  . Frequency of Social Gatherings with Friends and Family: Not on file  . Attends Religious Services: Not on file  . Active Member of Clubs or Organizations: Not on file  . Attends Archivist Meetings: Not on file  . Marital Status: Not on file     Observations/Objective: Appears well in NAD Breathing normally Mood and affect normal  Assessment and Plan:  See Problem List for Assessment and Plan of chronic medical problems.   Follow Up Instructions:    I discussed the assessment and treatment plan with the patient. The patient was provided an opportunity to ask questions and all were answered. The patient agreed with the plan and demonstrated an  understanding of the instructions.   The patient was advised to call back or seek an in-person evaluation if the symptoms worsen or if the condition fails to improve as anticipated.    Binnie Rail, MD

## 2019-05-29 ENCOUNTER — Ambulatory Visit (INDEPENDENT_AMBULATORY_CARE_PROVIDER_SITE_OTHER): Payer: Medicare HMO | Admitting: Internal Medicine

## 2019-05-29 ENCOUNTER — Encounter: Payer: Self-pay | Admitting: Internal Medicine

## 2019-05-29 DIAGNOSIS — R69 Illness, unspecified: Secondary | ICD-10-CM | POA: Diagnosis not present

## 2019-05-29 DIAGNOSIS — I10 Essential (primary) hypertension: Secondary | ICD-10-CM | POA: Diagnosis not present

## 2019-05-29 DIAGNOSIS — F3289 Other specified depressive episodes: Secondary | ICD-10-CM | POA: Diagnosis not present

## 2019-05-29 DIAGNOSIS — F419 Anxiety disorder, unspecified: Secondary | ICD-10-CM | POA: Diagnosis not present

## 2019-05-29 NOTE — Assessment & Plan Note (Signed)
Has some mild depression and anxiety Did not tolerate fluoxetine or Lexapro Can consider low-dose clonazepam more Celexa She would like to hold off for now until she gets her blood pressure straightened out We will options when she is ready

## 2019-05-29 NOTE — Assessment & Plan Note (Signed)
Chronic Her blood pressure is variable, often too low and sometimes too high Given her age I discussed with her that we need to have more leniency for higher blood pressure and it is more dangerous to have a low blood pressure She will try to go off medication for 1 week and monitor her BP.  If it becomes too elevated at that time I would recommend starting half of the losartan in the evening She is trying to exercise, but her balance is not as good and she does not feel comfortable walking too far walking outside She will make a follow-up visit so we can review her blood pressures

## 2019-06-02 ENCOUNTER — Telehealth: Payer: Self-pay

## 2019-06-02 NOTE — Telephone Encounter (Signed)
Pt called and stated that her [employer] nurse came out and said that she had severe PAD and should be seen. She said that she has also noticed some numbness in her legs as well. Pt request an appt.   Called patient and advised her that since she has not been seen since 2018 she would be considered a new patient and would need to have a referral. Also advised her that it would be helpful to have any notes from her home visit as well  York Cerise, [place]

## 2019-06-04 ENCOUNTER — Ambulatory Visit: Payer: Medicare HMO

## 2019-06-04 ENCOUNTER — Encounter: Payer: Self-pay | Admitting: Internal Medicine

## 2019-06-04 DIAGNOSIS — I739 Peripheral vascular disease, unspecified: Secondary | ICD-10-CM

## 2019-06-13 ENCOUNTER — Ambulatory Visit: Payer: Medicare HMO | Attending: Internal Medicine

## 2019-06-13 DIAGNOSIS — Z23 Encounter for immunization: Secondary | ICD-10-CM | POA: Insufficient documentation

## 2019-06-13 NOTE — Progress Notes (Signed)
Covid-19 Vaccination Clinic  Name:  Tamara Robertson    MRN: [id number] DOB: [birth year]  06/13/2019  [name] was observed post Covid-19 immunization for 15 minutes without incidence. She was provided with Vaccine Information Sheet and instruction to access the V-Safe system.   [name] was instructed to call 911 with any severe reactions post vaccine: [name] Difficulty breathing  . Swelling of your face and throat  . A fast heartbeat  . A bad rash all over your body  . Dizziness and weakness    Immunizations Administered    Name Date Dose VIS Date Route   Pfizer COVID-19 Vaccine 06/13/2019  9:47 AM 0.3 mL 04/17/2019 Intramuscular   Manufacturer: [employer], [employer]   Lot: [id number]   NDC: [address]-6

## 2019-07-07 DIAGNOSIS — H401112 Primary open-angle glaucoma, right eye, moderate stage: Secondary | ICD-10-CM | POA: Diagnosis not present

## 2019-07-07 DIAGNOSIS — H04123 Dry eye syndrome of bilateral lacrimal glands: Secondary | ICD-10-CM | POA: Diagnosis not present

## 2019-07-08 ENCOUNTER — Ambulatory Visit: Payer: Medicare HMO | Attending: Internal Medicine

## 2019-07-08 DIAGNOSIS — Z23 Encounter for immunization: Secondary | ICD-10-CM | POA: Insufficient documentation

## 2019-07-08 NOTE — Progress Notes (Signed)
Covid-19 Vaccination Clinic  Name:  Tamara Robertson    MRN: [id number] DOB: 1930-04-16  07/08/2019  Tamara Robertson was observed post Covid-19 immunization for 15 minutes without incident. She was provided with Vaccine Information Sheet and instruction to access the V-Safe system.   Tamara Robertson was instructed to call 911 with any severe reactions post vaccine: [name] Difficulty breathing  . Swelling of face and throat  . A fast heartbeat  . A bad rash all over body  . Dizziness and weakness   Immunizations Administered    Name Date Dose VIS Date Route   Pfizer COVID-19 Vaccine 07/08/2019  2:29 PM 0.3 mL 04/17/2019 Intramuscular   Manufacturer: [employer], [employer]   Lot: [id number]   NDC: [address]-2

## 2019-07-14 DIAGNOSIS — L821 Other seborrheic keratosis: Secondary | ICD-10-CM | POA: Diagnosis not present

## 2019-07-14 DIAGNOSIS — L719 Rosacea, unspecified: Secondary | ICD-10-CM | POA: Diagnosis not present

## 2019-07-14 DIAGNOSIS — I872 Venous insufficiency (chronic) (peripheral): Secondary | ICD-10-CM | POA: Diagnosis not present

## 2019-07-24 DIAGNOSIS — M25552 Pain in left hip: Secondary | ICD-10-CM | POA: Insufficient documentation

## 2019-07-24 DIAGNOSIS — M4186 Other forms of scoliosis, lumbar region: Secondary | ICD-10-CM | POA: Diagnosis not present

## 2019-07-30 ENCOUNTER — Other Ambulatory Visit: Payer: Self-pay | Admitting: *Deleted

## 2019-07-30 ENCOUNTER — Encounter: Payer: Self-pay | Admitting: Internal Medicine

## 2019-07-30 ENCOUNTER — Telehealth (HOSPITAL_COMMUNITY): Payer: Self-pay

## 2019-07-30 ENCOUNTER — Telehealth: Payer: Self-pay | Admitting: Internal Medicine

## 2019-07-30 DIAGNOSIS — I739 Peripheral vascular disease, unspecified: Secondary | ICD-10-CM

## 2019-07-30 NOTE — Telephone Encounter (Signed)
Pt can not come in tomorrow bc she does not have a ride. Please advise. Any response can be sent to her through my chart. Her most recent reading was 145/80. She is aware you are not here this afternoon.

## 2019-07-30 NOTE — Telephone Encounter (Signed)
New message:   Pt states she is having some issues with her BP. She gave a few readings of  195/95, 180/121,and 145/80 as of right now. Please advise.

## 2019-07-30 NOTE — Telephone Encounter (Signed)
Not sure what happened - increase losartan to 25 mg twice daily - hold for BP < 150.  Update tomorrow with readings.

## 2019-07-30 NOTE — Telephone Encounter (Signed)
The above patient or their representative was contacted and gave the following answers to these questions:         Do you have any of the following symptoms?    NO  Fever                    Cough                   Shortness of breath  Do  you have any of the following other symptoms?    muscle pain         vomiting,        diarrhea        rash         weakness        red eye        abdominal pain         bruising          bruising or bleeding              joint pain           severe headache    Have you been in contact with someone who was or has been sick in the past 2 weeks?  NO  Yes                 Unsure                         Unable to assess   Does the person that you were in contact with have any of the following symptoms?   Cough         shortness of breath           muscle pain         vomiting,            diarrhea            rash            weakness           fever            red eye           abdominal pain           bruising  or  bleeding                joint pain                severe headache                 COMMENTS OR ACTION PLAN FOR THIS PATIENT:        ALL QUESTIONS WERE ANSWERED/[facility] PATIENT HAS BEEN FULLY VACCINATED/[facility]

## 2019-07-31 ENCOUNTER — Other Ambulatory Visit: Payer: Self-pay

## 2019-07-31 ENCOUNTER — Ambulatory Visit: Payer: Medicare HMO | Admitting: Vascular Surgery

## 2019-07-31 ENCOUNTER — Ambulatory Visit (HOSPITAL_COMMUNITY)
Admission: RE | Admit: 2019-07-31 | Discharge: 2019-07-31 | Disposition: A | Discharge status: Home / Self Care | Payer: Medicare HMO | Source: Ambulatory Visit | Attending: Vascular Surgery | Admitting: Vascular Surgery

## 2019-07-31 ENCOUNTER — Encounter: Payer: Self-pay | Admitting: Vascular Surgery

## 2019-07-31 VITALS — BP 149/73 | HR 78 | Temp 97.2°F | Resp 14 | Ht 62.0 in | Wt 142.0 lb

## 2019-07-31 DIAGNOSIS — I739 Peripheral vascular disease, unspecified: Secondary | ICD-10-CM | POA: Insufficient documentation

## 2019-07-31 NOTE — Telephone Encounter (Signed)
   Patient calling to report at noon today her BP was 153/84 HR 80

## 2019-07-31 NOTE — Telephone Encounter (Signed)
Ok this is not ideal but not a dangerous BP -- continue losartan twice daily and monitor over the weekend and update [place] on monday

## 2019-07-31 NOTE — Telephone Encounter (Signed)
Tried calling pt. No answer. Sent my chart message since we have also been going back and forth through my chart.

## 2019-07-31 NOTE — Progress Notes (Signed)
Patient ID: Tamara Robertson, female   DOB: Mar 04, 1930, 86 y.o.   MRN: [id number]  Reason for Consult: New Patient (Initial Visit) (PAD)   Referred by Pincus Sanes, MD  Subjective:     HPI:  Tamara Robertson is a 86 y.o. female previous history of a stent in her left SFA in 2011 in [place].  She is subsequently evaluated in our office for venous reflux on the left but no intervention was undertaken given possible need in the future for her vein.  She has had venous intervention that include stripping on the right side in the past.  She does have significant varicosities on the left no bleeding or other complication.  She does occasionally wear compression but since the death of her husband she struggles to get these on and off.  She does have some cramping in her calf with walking at approximately 5 minutes.  Usually cramping resolves after rest.  This is focused on the left leg.  She had ABIs performed prior to today's visit.  She denies tissue loss or ulceration.  She has not had stroke TIA or amaurosis.  Past Medical History:  Diagnosis Date  . Anxiety   . CAD (coronary artery disease)    RCA 40% stenosis cath 01/2011  . Cholelithiasis    s/p lap chole 09/2014  . Diverticular stricture [facility]) 2006   [facility]  . DIVERTICULITIS, HX OF   . Diverticulosis   . DYSLIPIDEMIA   . Elevated LFTs   . GERD   . Glaucoma   . Hepatic steatosis   . HOH (hard of hearing)   . Immune thrombocytopenic purpura (HCC)    chronic - baseline 80-100K, on pred  . Irritable bowel syndrome   . Left ovarian cyst dx 01/2013 CT   working with gyn, ?malignant - elevated tumor marker OVA1  . OSTEOARTHRITIS, KNEE, RIGHT   . OSTEOPENIA   . OVERACTIVE BLADDER   . Prolapse of female bladder, acquired 11/2017  . UNSPECIFIED PERIPHERAL VASCULAR DISEASE   . URINARY INCONTINENCE    Family History  Problem Relation Age of Onset  . Coronary artery disease Mother   .  Heart attack Mother 50  . Hyperlipidemia Mother   . Hypertension Mother   . Stomach cancer Father   . Hypertension Daughter   . Hyperlipidemia Daughter   . Arthritis Other        parent  . Transient ischemic attack Other        parent  . Colon cancer Neg Hx    Past Surgical History:  Procedure Laterality Date  . ABDOMINAL HYSTERECTOMY  1963  . ANGIOPLASTY    . APPENDECTOMY  1956  . CARDIAC CATHETERIZATION    . CATARACT EXTRACTION, BILATERAL  10/2010  . CHOLECYSTECTOMY N/A 09/16/2014   Procedure: LAPAROSCOPIC CHOLECYSTECTOMY ;  Surgeon: Emelia Loron, MD;  Location: [facility] OR;  Service: General;  Laterality: N/A;  . KNEE ARTHROSCOPY Right   . L pop PTA  10/2009   stent  . LAPAROSCOPIC SIGMOID COLECTOMY  10/2005  . SPLENECTOMY  1954  . VARICOSE VEIN SURGERY Right 1962    Short Social History:  Social History   Tobacco Use  . Smoking status: Never Smoker  . Smokeless tobacco: Never Used  Substance Use Topics  . Alcohol use: No    Alcohol/week: 0.0 standard drinks    Comment: rarely    Allergies  Allergen Reactions  . Aspirin Other (See Comments)  ITP  . Sulfa Antibiotics Other (See Comments)    dizziness  . Contrast Media [Iodinated Diagnostic Agents] Hives    CAT scan contrast only  . Lexapro [Escitalopram]     Hypertension  . Fluoxetine     Did not feel well on it   . Statins Other (See Comments)    Current Outpatient Medications  Medication Sig Dispense Refill  . acetaminophen (TYLENOL) 500 MG tablet Take 500 mg by mouth every 6 (six) hours as needed.    [name] ascorbic acid (VITAMIN C) 500 MG tablet Take by mouth.    . Biotin 2500 MCG CAPS Take 1 capsule by mouth daily.     . calcium carbonate (TUMS - DOSED IN MG ELEMENTAL CALCIUM) 500 MG chewable tablet Chew 1 tablet by mouth daily as needed for indigestion or heartburn.    . Calcium-Magnesium-Vitamin D (CITRACAL CALCIUM+D) 600-40-500 MG-MG-UNIT TB24 Take 1 tablet by mouth daily.     . cycloSPORINE  (RESTASIS) 0.05 % ophthalmic emulsion 1 drop every 12 (twelve) hours.    . dorzolamide-timolol (COSOPT) 22.3-6.8 MG/ML ophthalmic solution Place 1 drop into both eyes 2 (two) times daily. 10 mL 12  . famotidine (PEPCID) 20 MG tablet Take 1 tablet (20 mg total) by mouth 2 (two) times daily as needed for heartburn or indigestion. 60 tablet 3  . Flaxseed MISC Take 5 mLs by mouth daily. Take 1 tsp daily    . furosemide (LASIX) 20 MG tablet TAKE 1 TABLET BY MOUTH AS NEEDED 90 tablet 2  . Glucos-Chond-Hyal Ac-Ca Fructo (MOVE FREE JOINT HEALTH ADVANCE PO) Take by mouth.    . losartan (COZAAR) 25 MG tablet Take 1 tablet (25 mg total) by mouth daily. 90 tablet 1  . Multiple Vitamin (MULTIVITAMIN) tablet Take 1 tablet by mouth daily.      . naproxen sodium (ALEVE) 220 MG tablet Take 220 mg by mouth as needed.     . polyethylene glycol (MIRALAX / GLYCOLAX) packet Take 17 g by mouth daily as needed for moderate constipation.     . predniSONE (DELTASONE) 2.5 MG tablet TAKE 1 TABLET BY MOUTH EVERY OTHER DAY 45 tablet 0  . Probiotic Product (PROBIOTIC DAILY PO) Take by mouth.    . psyllium (METAMUCIL) 58.6 % packet Take 1 packet by mouth daily as needed.    . docusate sodium (COLACE) 100 MG capsule Take 100 mg by mouth at bedtime.     [name] estrogens, conjugated, (PREMARIN) 0.3 MG tablet Take by mouth.    . latanoprost (XALATAN) 0.005 % ophthalmic solution Apply to eye.     No current facility-administered medications for this visit.    Review of Systems  Constitutional:  Constitutional negative. HENT: HENT negative.  Eyes: Eyes negative.  Respiratory: Respiratory negative.  Cardiovascular: Positive for claudication.  GI: Gastrointestinal negative.  Musculoskeletal: Musculoskeletal negative.  Skin: Skin negative.  Neurological: Neurological negative. Hematologic: Hematologic/lymphatic negative.  Psychiatric: Psychiatric negative.        Objective:  Objective   Vitals:   07/31/19 1449  BP: (!)  149/73  Pulse: 78  Resp: 14  Temp: (!) 97.2 F (36.2 C)  TempSrc: Temporal  SpO2: 98%  Weight: 142 lb (64.4 kg)  Height: 5\' 2"  (1.575 m)   Body mass index is 25.97 kg/m.  Physical Exam HENT:     Head: Normocephalic.     Nose: Nose normal.     Mouth/Throat:     Mouth: Mucous membranes are moist.  Eyes:  Pupils: Pupils are equal, round, and reactive to light.  Cardiovascular:     Pulses:          Radial pulses are 2+ on the right side and 2+ on the left side.       Dorsalis pedis pulses are 1+ on the right side and 1+ on the left side.  Pulmonary:     Effort: Pulmonary effort is normal.  Abdominal:     General: Abdomen is flat.     Palpations: Abdomen is soft.  Musculoskeletal:     Left lower leg: Edema present.  Skin:    Capillary Refill: Capillary refill takes less than 2 seconds.     Comments: Significant left lower extremity varicosities  Neurological:     General: No focal deficit present.     Mental Status: She is alert.  Psychiatric:        Mood and Affect: Mood normal.        Behavior: Behavior normal.        Thought Content: Thought content normal.        Judgment: Judgment normal.     Data: I have independently interpreted her ABIs to be 0.95 right and 0.93 left.  They are biphasic bilaterally.  Toe pressure on the right 79 and left 105.     Assessment/Plan:    86 year old female history of left SFA stent.  Does have claudication type symptoms but palpable pulses 1+.  ABIs are preserved.  We will get exercise studies in 1 year as well as duplex of this left SFA stent.  She does have some swelling discomfort the left lower extremity with varicosities but again we will hold on any venous intervention given her underlying arterial disease.      Maeola Harman MD [facility]

## 2019-08-02 NOTE — Progress Notes (Signed)
Subjective:    Patient ID: Tamara Robertson, female    DOB: 1930-03-15, 86 y.o.   MRN: [id number]  HPI The patient is here for follow up hypertension  Her BP has been high at home recently for no obvious reason.  She increased losartan to 25 mg twice daily.  This has improved the blood pressure.  It sometimes is still high around midnight-she often checks it because she is a night owl.  BP 139/74 this morning.  Saturday 133-138,   162/85 at midnight.    She fell in a parking lot 2 weeks ago.  She twisted her hip and saw orthopedics and there was nothing more serious.  Shortly after the fall she started experiencing neck pain and posterior headaches.  She is also had some dizziness.  With this she states dizziness, unstable walking, head is drawing-like something is pulling her scalp.  Last week woke up and room was spinning -at this time she took her blood pressure and it was very high BP 195/95, later 185/?[name]    The dizziness has persisted.  She still has continuous ache in her posterior neck and her posterior head since the fall.  All in all and heat do not help much.  With the symptoms she is not able to drive.       Medications and allergies reviewed with patient and updated if appropriate.  Patient Active Problem List   Diagnosis Date Noted  . Diarrhea 03/13/2019  . Nonrheumatic aortic valve stenosis 02/26/2019  . Lightheadedness 11/19/2018  . Epistaxis 11/19/2018  . 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 Rash 03/25/2018  . Chronic left-sided headaches 01/28/2018  . Depression 12/18/2017  . SBO (small bowel obstruction) (HCC) 09/20/2017  . Aortic stenosis, moderate 08/22/2017  . CAD (coronary artery disease) 08/21/2017  . Pleural effusion on right   . Streptococcal pneumonia (HCC) 06/26/2017  . Community acquired pneumonia of right lower lobe of lung 06/25/2017  . Current chronic use of systemic steroids 06/25/2017  . Chronic lower back pain 05/14/2017  . Varicose  veins of left lower extremity with complications 03/05/2017  . Bilateral hearing loss 02/01/2017  . Hypertension 07/31/2016  . Leg edema 07/31/2016  . Prediabetes 06/05/2016  . Osteopenia 12/01/2015  . Anxiety 07/19/2015  . Degenerative cervical disc 12/28/2014  . Left ovarian cyst   . Peripheral neuropathy 06/03/2012  . PVD (peripheral vascular disease) (HCC) 11/23/2010  . Bursitis of hip 10/26/2010  . Hyperlipidemia 06/27/2010  . IMMUNE THROMBOCYTOPENIC PURPURA 06/27/2010  . Unspecified glaucoma 06/27/2010  . GERD 06/27/2010  . Irritable bowel syndrome 06/27/2010  . OVERACTIVE BLADDER 06/27/2010  . OSTEOARTHRITIS, KNEE, RIGHT 06/27/2010  . URINARY INCONTINENCE 06/27/2010    Current Outpatient Medications on File Prior to Visit  Medication Sig Dispense Refill  . acetaminophen (TYLENOL) 500 MG tablet Take 500 mg by mouth every 6 (six) hours as needed.    [name] ascorbic acid (VITAMIN C) 500 MG tablet Take by mouth.    . Biotin 2500 MCG CAPS Take 1 capsule by mouth daily.     . calcium carbonate (TUMS - DOSED IN MG ELEMENTAL CALCIUM) 500 MG chewable tablet Chew 1 tablet by mouth daily as needed for indigestion or heartburn.    . Calcium-Magnesium-Vitamin D (CITRACAL CALCIUM+D) 600-40-500 MG-MG-UNIT TB24 Take 1 tablet by mouth daily.     . cycloSPORINE (RESTASIS) 0.05 % ophthalmic emulsion 1 drop every 12 (twelve) hours.    . dorzolamide-timolol (COSOPT) 22.3-6.8 MG/ML ophthalmic solution Place 1 drop  into both eyes 2 (two) times daily. 10 mL 12  . famotidine (PEPCID) 20 MG tablet Take 1 tablet (20 mg total) by mouth 2 (two) times daily as needed for heartburn or indigestion. 60 tablet 3  . Flaxseed MISC Take 5 mLs by mouth daily. Take 1 tsp daily    . furosemide (LASIX) 20 MG tablet TAKE 1 TABLET BY MOUTH AS NEEDED 90 tablet 2  . Glucos-Chond-Hyal Ac-Ca Fructo (MOVE FREE JOINT HEALTH ADVANCE PO) Take by mouth.    . losartan (COZAAR) 25 MG tablet Take 1 tablet (25 mg total) by mouth  daily. 90 tablet 1  . Multiple Vitamin (MULTIVITAMIN) tablet Take 1 tablet by mouth daily.      . naproxen sodium (ALEVE) 220 MG tablet Take 220 mg by mouth as needed.     . polyethylene glycol (MIRALAX / GLYCOLAX) packet Take 17 g by mouth daily as needed for moderate constipation.     . predniSONE (DELTASONE) 2.5 MG tablet TAKE 1 TABLET BY MOUTH EVERY OTHER DAY 45 tablet 0  . Probiotic Product (PROBIOTIC DAILY PO) Take by mouth.    . psyllium (METAMUCIL) 58.6 % packet Take 1 packet by mouth daily as needed.     No current facility-administered medications on file prior to visit.    Past Medical History:  Diagnosis Date  . Anxiety   . CAD (coronary artery disease)    RCA 40% stenosis cath 01/2011  . Cholelithiasis    s/p lap chole 09/2014  . Diverticular stricture [facility]) 2006   [facility]  . DIVERTICULITIS, HX OF   . Diverticulosis   . DYSLIPIDEMIA   . Elevated LFTs   . GERD   . Glaucoma   . Hepatic steatosis   . HOH (hard of hearing)   . Immune thrombocytopenic purpura (HCC)    chronic - baseline 80-100K, on pred  . Irritable bowel syndrome   . Left ovarian cyst dx 01/2013 CT   working with gyn, ?malignant - elevated tumor marker OVA1  . OSTEOARTHRITIS, KNEE, RIGHT   . OSTEOPENIA   . OVERACTIVE BLADDER   . Prolapse of female bladder, acquired 11/2017  . UNSPECIFIED PERIPHERAL VASCULAR DISEASE   . URINARY INCONTINENCE     Past Surgical History:  Procedure Laterality Date  . ABDOMINAL HYSTERECTOMY  1963  . ANGIOPLASTY    . APPENDECTOMY  1956  . CARDIAC CATHETERIZATION    . CATARACT EXTRACTION, BILATERAL  10/2010  . CHOLECYSTECTOMY N/A 09/16/2014   Procedure: LAPAROSCOPIC CHOLECYSTECTOMY ;  Surgeon: Emelia Loron, MD;  Location: [facility] OR;  Service: General;  Laterality: N/A;  . KNEE ARTHROSCOPY Right   . L pop PTA  10/2009   stent  . LAPAROSCOPIC SIGMOID COLECTOMY  10/2005  . SPLENECTOMY  1954  . VARICOSE VEIN SURGERY Right 1962    Social History     Socioeconomic History  . Marital status: Widowed    Spouse name: Not on file  . Number of children: 2  . Years of education: Not on file  . Highest education level: Not on file  Occupational History  . Occupation: Retired    Comment: Programmer, multimedia  Tobacco Use  . Smoking status: Never Smoker  . Smokeless tobacco: Never Used  Substance and Sexual Activity  . Alcohol use: No    Alcohol/week: 0.0 standard drinks    Comment: rarely  . Drug use: No  . Sexual activity: Not Currently    Comment: 1st intercourse- 19,  partners- 2,  widow  Other Topics Concern  . 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    [place] Ham to [place] from [place] 05/2010 to be close to kids   Social Determinants of Health   Financial Resource Strain:   . Difficulty of Paying Living Expenses:   Food Insecurity:   . Worried About Programme researcher, broadcasting/film/video in the Last Year:   . Barista in the Last Year:   Transportation Needs:   . Freight forwarder (Medical):   [name] Lack of Transportation (Non-Medical):   Physical Activity:   . Days of Exercise per Week:   . Minutes of Exercise per Session:   Stress:   . Feeling of Stress :   Social Connections:   . Frequency of Communication with Friends and Family:   . Frequency of Social Gatherings with Friends and Family:   . Attends Religious Services:   . Active Member of Clubs or Organizations:   . Attends Banker Meetings:   [name] Marital Status:     Family History  Problem Relation Age of Onset  . Coronary artery disease Mother   . Heart attack Mother 30  . Hyperlipidemia Mother   . Hypertension Mother   . Stomach cancer Father   . Hypertension Daughter   . Hyperlipidemia Daughter   . Arthritis Other        parent  . Transient ischemic attack Other        parent  . Colon cancer Neg Hx     Review of Systems     Objective:  There were no vitals filed for this  visit. BP Readings from Last 3 Encounters:  07/31/19 (!) 149/73  05/13/19 (!) 148/77  04/27/19 (!) 160/82   Wt Readings from Last 3 Encounters:  07/31/19 142 lb (64.4 kg)  04/27/19 138 lb 6.4 oz (62.8 kg)  04/13/19 144 lb (65.3 kg)   There is no height or weight on file to calculate BMI.   Physical Exam    Constitutional: Appears well-developed and well-nourished. No distress.  HENT:  Head: Normocephalic and atraumatic.  Neck: Neck supple. No tracheal deviation present. No thyromegaly present.  No cervical lymphadenopathy Cardiovascular: Normal rate, regular rhythm and normal heart sounds.   No murmur heard. No carotid bruit .  No edema Pulmonary/Chest: Effort normal and breath sounds normal. No respiratory distress. No has no wheezes. No rales.  Skin: Skin is warm and dry. Not diaphoretic.  Psychiatric: Normal mood and affect. Behavior is normal.      Assessment & Plan:    See Problem List for Assessment and Plan of chronic medical problems.    This visit occurred during the SARS-CoV-2 public health emergency.  Safety protocols were in place, including screening questions prior to the visit, additional usage of staff PPE, and extensive cleaning of exam room while observing appropriate contact time as indicated for disinfecting solutions.

## 2019-08-03 ENCOUNTER — Other Ambulatory Visit: Payer: Self-pay

## 2019-08-03 ENCOUNTER — Encounter: Payer: Self-pay | Admitting: Internal Medicine

## 2019-08-03 ENCOUNTER — Other Ambulatory Visit: Payer: Self-pay | Admitting: *Deleted

## 2019-08-03 ENCOUNTER — Ambulatory Visit (INDEPENDENT_AMBULATORY_CARE_PROVIDER_SITE_OTHER): Payer: Medicare HMO | Admitting: Internal Medicine

## 2019-08-03 DIAGNOSIS — I10 Essential (primary) hypertension: Secondary | ICD-10-CM

## 2019-08-03 DIAGNOSIS — M542 Cervicalgia: Secondary | ICD-10-CM

## 2019-08-03 DIAGNOSIS — R42 Dizziness and giddiness: Secondary | ICD-10-CM | POA: Diagnosis not present

## 2019-08-03 DIAGNOSIS — F419 Anxiety disorder, unspecified: Secondary | ICD-10-CM

## 2019-08-03 DIAGNOSIS — G4489 Other headache syndrome: Secondary | ICD-10-CM | POA: Insufficient documentation

## 2019-08-03 DIAGNOSIS — R69 Illness, unspecified: Secondary | ICD-10-CM | POA: Diagnosis not present

## 2019-08-03 DIAGNOSIS — F3289 Other specified depressive episodes: Secondary | ICD-10-CM | POA: Diagnosis not present

## 2019-08-03 DIAGNOSIS — I739 Peripheral vascular disease, unspecified: Secondary | ICD-10-CM

## 2019-08-03 MED ORDER — CITALOPRAM HYDROBROMIDE 10 MG PO TABS: 10.0000 mg | ORAL_TABLET | Freq: Every day | ORAL | 5 refills | Status: DC

## 2019-08-03 MED ORDER — LOSARTAN POTASSIUM 25 MG PO TABS: 25.0000 mg | ORAL_TABLET | Freq: Two times a day (BID) | ORAL | 1 refills | Status: DC

## 2019-08-03 NOTE — Assessment & Plan Note (Signed)
Acute since her fall 2 weeks ago Associated with posterior head pain and dizziness Deferred referral to Ortho or neurology due to not being able to drive We will try physical therapy-we will need home PT since she is not able to drive and she is unsteady

## 2019-08-03 NOTE — Assessment & Plan Note (Signed)
Acute since her fall 2 weeks ago Associated with posterior neck pain and dizziness Deferred referral to Ortho or neurology due to not being able to drive We will try physical therapy-we will need home PT since she is not able to drive and she is unsteady

## 2019-08-03 NOTE — Assessment & Plan Note (Signed)
Chronic Recently had elevation in BP without obvious cause-?  Related to recent fall, neck injury, headache Blood pressure improved with increase losartan to 25 mg twice daily He will continue to monitor her BP at home and update me via MyChart

## 2019-08-03 NOTE — Assessment & Plan Note (Signed)
Acute since her fall 2 weeks ago Associated with posterior head pain and neck pain Deferred referral to Ortho or neurology due to not being able to drive We will try physical therapy-we will need home PT since she is not able to drive and she is unsteady

## 2019-08-03 NOTE — Assessment & Plan Note (Signed)
Acute on chronic He is experiencing some increased anxiety again Did well with Lexapro, but it increased her BP She is interested in starting something different Trial of citalopram 5 mg daily, which is less likely to elevate blood pressure She will monitor her blood pressure not me know if there is an increase

## 2019-08-03 NOTE — Progress Notes (Signed)
Virtual Visit via Video Note  I connected with Tamara Robertson on 08/03/19 at  2:30 PM EDT by a video enabled telemedicine application and verified that I am speaking with the correct person using two identifiers.   I discussed the limitations of evaluation and management by telemedicine and the availability of in person appointments. The patient expressed understanding and agreed to proceed.  Present for the visit:  Myself, Dr Billey Gosling, Tamara Robertson.  The patient is currently at home and I am in the office.    No referring provider.    History of Present Illness: The patient is here for follow up hypertension  Her BP has been high at home recently for no obvious reason.  She increased losartan to 25 mg twice daily.  This has improved the blood pressure.  It sometimes is still high around midnight-she often checks it because she is a night owl.  BP 139/74 this morning.  Saturday 133-138,   162/85 at midnight.    She fell in a parking lot 2 weeks ago.  She twisted her hip and saw orthopedics and there was nothing more serious.  Shortly after the fall she started experiencing neck pain and posterior headaches.  She is also had some dizziness.  With this she states dizziness, unstable walking, head is drawing-like something is pulling her scalp.  Last week woke up and room was spinning -at this time she took her blood pressure and it was very high BP 195/95, later 185/?[name]    The dizziness has persisted.  She still has continuous ache in her posterior neck and her posterior head since the fall.  All in all and heat do not help much.  With the symptoms she is not able to drive.  She did not tolerate the Lexapro because elevated her blood pressure but she really thinks it helped.  She is interested in trying a different medication that is similar, but just does not want to elevate her BP.   Social History   Socioeconomic History  . Marital status: Widowed    Spouse name: Not on file   . Number of children: 2  . Years of education: Not on file  . Highest education level: Not on file  Occupational History  . Occupation: Retired    Comment: Retail banker  Tobacco Use  . Smoking status: Never Smoker  . Smokeless tobacco: Never Used  Substance and Sexual Activity  . Alcohol use: No    Alcohol/week: 0.0 standard drinks    Comment: rarely  . Drug use: No  . Sexual activity: Not Currently    Comment: 1st intercourse- 19, partners- 2,  widow  Other Topics Concern  . 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    D[place] Rank to [place] from [place] 05/2010 to be close to kids   Social Determinants of Health   Financial Resource Strain:   . Difficulty of Paying Living Expenses:   Food Insecurity:   . Worried About Charity fundraiser in the Last Year:   . Arboriculturist in the Last Year:   Transportation Needs:   . Film/video editor (Medical):   [name] Lack of Transportation (Non-Medical):   Physical Activity:   . Days of Exercise per Week:   . Minutes of Exercise per Session:   Stress:   . Feeling of Stress :   Social Connections:   . Frequency  of Communication with Friends and Family:   . Frequency of Social Gatherings with Friends and Family:   . Attends Religious Services:   . Active Member of Clubs or Organizations:   . Attends Archivist Meetings:   [name] Marital Status:      Observations/Objective: Appears well in NAD Breathing normal, speaking in full sentences Skin appears warm and dry Mood is slightly anxious  Assessment and Plan:  See Problem List for Assessment and Plan of chronic medical problems.   Follow Up Instructions:    I discussed the assessment and treatment plan with the patient. The patient was provided an opportunity to ask questions and all were answered. The patient agreed with the plan and demonstrated an understanding of the instructions.   The patient  was advised to call back or seek an in-person evaluation if the symptoms worsen or if the condition fails to improve as anticipated.    Binnie Rail, MD

## 2019-08-06 DIAGNOSIS — I739 Peripheral vascular disease, unspecified: Secondary | ICD-10-CM | POA: Diagnosis not present

## 2019-08-06 DIAGNOSIS — G629 Polyneuropathy, unspecified: Secondary | ICD-10-CM | POA: Diagnosis not present

## 2019-08-06 DIAGNOSIS — G8921 Chronic pain due to trauma: Secondary | ICD-10-CM | POA: Diagnosis not present

## 2019-08-06 DIAGNOSIS — I10 Essential (primary) hypertension: Secondary | ICD-10-CM | POA: Diagnosis not present

## 2019-08-06 DIAGNOSIS — I2510 Atherosclerotic heart disease of native coronary artery without angina pectoris: Secondary | ICD-10-CM | POA: Diagnosis not present

## 2019-08-06 DIAGNOSIS — G8929 Other chronic pain: Secondary | ICD-10-CM | POA: Diagnosis not present

## 2019-08-06 DIAGNOSIS — M542 Cervicalgia: Secondary | ICD-10-CM | POA: Diagnosis not present

## 2019-08-06 DIAGNOSIS — R42 Dizziness and giddiness: Secondary | ICD-10-CM | POA: Diagnosis not present

## 2019-08-06 DIAGNOSIS — G4489 Other headache syndrome: Secondary | ICD-10-CM | POA: Diagnosis not present

## 2019-08-06 DIAGNOSIS — M1712 Unilateral primary osteoarthritis, left knee: Secondary | ICD-10-CM | POA: Diagnosis not present

## 2019-08-11 DIAGNOSIS — M1712 Unilateral primary osteoarthritis, left knee: Secondary | ICD-10-CM | POA: Diagnosis not present

## 2019-08-11 DIAGNOSIS — I10 Essential (primary) hypertension: Secondary | ICD-10-CM | POA: Diagnosis not present

## 2019-08-11 DIAGNOSIS — I739 Peripheral vascular disease, unspecified: Secondary | ICD-10-CM | POA: Diagnosis not present

## 2019-08-11 DIAGNOSIS — G629 Polyneuropathy, unspecified: Secondary | ICD-10-CM | POA: Diagnosis not present

## 2019-08-11 DIAGNOSIS — I2510 Atherosclerotic heart disease of native coronary artery without angina pectoris: Secondary | ICD-10-CM | POA: Diagnosis not present

## 2019-08-11 DIAGNOSIS — R42 Dizziness and giddiness: Secondary | ICD-10-CM | POA: Diagnosis not present

## 2019-08-11 DIAGNOSIS — G4489 Other headache syndrome: Secondary | ICD-10-CM | POA: Diagnosis not present

## 2019-08-11 DIAGNOSIS — M542 Cervicalgia: Secondary | ICD-10-CM | POA: Diagnosis not present

## 2019-08-11 DIAGNOSIS — G8929 Other chronic pain: Secondary | ICD-10-CM | POA: Diagnosis not present

## 2019-08-11 DIAGNOSIS — G8921 Chronic pain due to trauma: Secondary | ICD-10-CM | POA: Diagnosis not present

## 2019-08-12 ENCOUNTER — Encounter: Payer: Self-pay | Admitting: Podiatry

## 2019-08-12 ENCOUNTER — Other Ambulatory Visit: Payer: Self-pay

## 2019-08-12 ENCOUNTER — Ambulatory Visit: Payer: Medicare HMO | Admitting: Podiatry

## 2019-08-12 VITALS — Temp 97.8°F

## 2019-08-12 DIAGNOSIS — M79675 Pain in left toe(s): Secondary | ICD-10-CM | POA: Diagnosis not present

## 2019-08-12 DIAGNOSIS — L84 Corns and callosities: Secondary | ICD-10-CM | POA: Diagnosis not present

## 2019-08-12 DIAGNOSIS — I739 Peripheral vascular disease, unspecified: Secondary | ICD-10-CM

## 2019-08-12 DIAGNOSIS — M79674 Pain in right toe(s): Secondary | ICD-10-CM

## 2019-08-12 DIAGNOSIS — B351 Tinea unguium: Secondary | ICD-10-CM | POA: Diagnosis not present

## 2019-08-12 NOTE — Patient Instructions (Signed)

## 2019-08-13 DIAGNOSIS — Z7189 Other specified counseling: Secondary | ICD-10-CM | POA: Insufficient documentation

## 2019-08-13 NOTE — Progress Notes (Signed)
Cardiology Office Note   Date:  08/14/2019   ID:  Tamara Robertson, DOB Mar 11, 1930, MRN [id number]   PCP:  Pincus Sanes, MD  Cardiologist:   No primary care provider on file.   Chief Complaint  Patient presents with  . Palpitations     History of Present Illness: Tamara Robertson is a 86 y.o. female who presents for evaluation of PT.  She has had non obstructive CAD.    She had remote PTA in [place], and chronic LE edema.   She returns for one year follow up.    Since I last saw her she has had multiple complaints.  She has had some lightheadedness and dizziness.  This seems to happen sporadically.  She did have vertigo.  Her blood pressure was running up significantly with that.  However, there were other times when it started dipping down and she actually went up on her medicines but then back down to her Dr. Lawerance Bach.  Most recent readings were fine.  She has had some mild shortness of breath.  She is having palpitations and she notices this most nights.  She feels her heart skipping or racing a little bit.  She is not had any presyncope or syncope.  She did fall stepping off a curb and she had physical therapy    Past Medical History:  Diagnosis Date  . Anxiety   . CAD (coronary artery disease)    RCA 40% stenosis cath 01/2011  . Cholelithiasis    s/p lap chole 09/2014  . Diverticular stricture [facility]) 2006   [facility]  . DIVERTICULITIS, HX OF   . Diverticulosis   . DYSLIPIDEMIA   . Elevated LFTs   . GERD   . Glaucoma   . Hepatic steatosis   . HOH (hard of hearing)   . Immune thrombocytopenic purpura (HCC)    chronic - baseline 80-100K, on pred  . Irritable bowel syndrome   . Left ovarian cyst dx 01/2013 CT   working with gyn, ?malignant - elevated tumor marker OVA1  . OSTEOARTHRITIS, KNEE, RIGHT   . OSTEOPENIA   . OVERACTIVE BLADDER   . Prolapse of female bladder, acquired 11/2017  . UNSPECIFIED PERIPHERAL VASCULAR DISEASE   .  URINARY INCONTINENCE     Past Surgical History:  Procedure Laterality Date  . ABDOMINAL HYSTERECTOMY  1963  . ANGIOPLASTY    . APPENDECTOMY  1956  . CARDIAC CATHETERIZATION    . CATARACT EXTRACTION, BILATERAL  10/2010  . CHOLECYSTECTOMY N/A 09/16/2014   Procedure: LAPAROSCOPIC CHOLECYSTECTOMY ;  Surgeon: Emelia Loron, MD;  Location: [facility] OR;  Service: General;  Laterality: N/A;  . KNEE ARTHROSCOPY Right   . L pop PTA  10/2009   stent  . LAPAROSCOPIC SIGMOID COLECTOMY  10/2005  . SPLENECTOMY  1954  . VARICOSE VEIN SURGERY Right 1962     Current Outpatient Medications  Medication Sig Dispense Refill  . Magnesium Oxide 250 MG TABS Take by mouth.    [name] acetaminophen (TYLENOL) 500 MG tablet Take 500 mg by mouth every 6 (six) hours as needed.    [name] ascorbic acid (VITAMIN C) 500 MG tablet Take by mouth.    . Biotin 2500 MCG CAPS Take 1 capsule by mouth daily.     . calcium carbonate (TUMS - DOSED IN MG ELEMENTAL CALCIUM) 500 MG chewable tablet Chew 1 tablet by mouth daily as needed for indigestion or heartburn.    . Calcium-Magnesium-Vitamin D (  CITRACAL CALCIUM+D) 600-40-500 MG-MG-UNIT TB24 Take 1 tablet by mouth daily.     . citalopram (CELEXA) 10 MG tablet Take 1 tablet (10 mg total) by mouth daily. 30 tablet 5  . cycloSPORINE (RESTASIS) 0.05 % ophthalmic emulsion 1 drop every 12 (twelve) hours.    . dorzolamide-timolol (COSOPT) 22.3-6.8 MG/ML ophthalmic solution Place 1 drop into both eyes 2 (two) times daily. 10 mL 12  . doxycycline (PERIOSTAT) 20 MG tablet Take 20 mg by mouth daily.    . famotidine (PEPCID) 20 MG tablet Take 1 tablet (20 mg total) by mouth 2 (two) times daily as needed for heartburn or indigestion. 60 tablet 3  . Flaxseed MISC Take 5 mLs by mouth daily. Take 1 tsp daily    . furosemide (LASIX) 20 MG tablet TAKE 1 TABLET BY MOUTH AS NEEDED 90 tablet 2  . Glucos-Chond-Hyal Ac-Ca Fructo (MOVE FREE JOINT HEALTH ADVANCE PO) Take by mouth.    . losartan (COZAAR) 25 MG  tablet Take 1 tablet (25 mg total) by mouth 2 (two) times daily. 180 tablet 1  . Multiple Vitamin (MULTIVITAMIN) tablet Take 1 tablet by mouth daily.      . naproxen sodium (ALEVE) 220 MG tablet Take 220 mg by mouth as needed.     . polyethylene glycol (MIRALAX / GLYCOLAX) packet Take 17 g by mouth daily as needed for moderate constipation.     . predniSONE (DELTASONE) 2.5 MG tablet TAKE 1 TABLET BY MOUTH EVERY OTHER DAY 45 tablet 0  . Probiotic Product (PROBIOTIC DAILY PO) Take by mouth.    . psyllium (METAMUCIL) 58.6 % packet Take 1 packet by mouth daily as needed.    . triamcinolone cream (KENALOG) 0.1 % APPLY CREAM EXTERNALLY TWICE DAILY FOR 30 DAYS     No current facility-administered medications for this visit.    Allergies:   Aspirin, Sulfa antibiotics, Contrast media [iodinated diagnostic agents], Lexapro [escitalopram], Fluoxetine, and Statins    ROS:  Please see the history of present illness.   Otherwise, review of systems are positive for none.   All other systems are reviewed and negative.     PHYSICAL EXAM: VS:  BP (!) 166/72   Pulse 81   Temp (!) 96.3 F (35.7 C)   Ht 5\' 2"  (1.575 m)   Wt 144 lb 3.2 oz (65.4 kg)   SpO2 96%   BMI 26.37 kg/m  , BMI Body mass index is 26.37 kg/m. GENERAL:  Well appearing NECK:  No jugular venous distention, waveform within normal limits, carotid upstroke brisk and symmetric, no bruits, no thyromegaly LUNGS:  Clear to auscultation bilaterally CHEST:  Unremarkable HEART:  PMI not displaced or sustained,S1 and S2 within normal limits, no S3, no S4, no clicks, no rubs, 2 out of 6 apical early peaking systolic murmur, no diastolic murmurs ABD:  Flat, positive bowel sounds normal in frequency in pitch, no bruits, no rebound, no guarding, no midline pulsatile mass, no hepatomegaly, no splenomegaly EXT:  2 plus pulses throughout, no edema, no cyanosis no clubbing   EKG:  EKG is  ordered today. Sinus rhythm, rate 81, axis within normal  limits, intervals within normal limits, no acute ST-T wave changes.  Recent Labs: 11/19/2018: ALT 12; BUN 19; Creatinine, Ser 0.90; Potassium 4.6; Sodium 140 12/23/2018: Hemoglobin 12.5; Platelet Count 200 02/11/2019: TSH 2.23    Lipid Panel    Component Value Date/Time   CHOL 196 07/04/2017 1150   CHOL 215 (H) 06/22/2014 0825   TRIG  193 (H) 06/30/2017 0708   TRIG 161 (H) 06/22/2014 0825   TRIG 228 02/20/2010 0000   HDL 58.40 05/23/2017 1151   HDL 51 06/22/2014 0825   CHOLHDL 3 05/23/2017 1151   VLDL 23.0 05/23/2017 1151   LDLCALC 107 (H) 05/23/2017 1151   LDLCALC 132 (H) 06/22/2014 0825   LDLDIRECT 155.9 03/19/2014 0828      Wt Readings from Last 3 Encounters:  08/14/19 144 lb 3.2 oz (65.4 kg)  07/31/19 142 lb (64.4 kg)  04/27/19 138 lb 6.4 oz (62.8 kg)      Other studies Reviewed: Additional studies/ records that were reviewed today include: Labs Review of the above records demonstrates:  NA   ASSESSMENT AND PLAN:   CAD (coronary artery disease) The patient has no new sypmtoms.  No further cardiovascular testing is indicated.  We will continue with aggressive risk reduction and meds as listed.  She had minor RCA disease on cath in 2012.  Aortic stenosis, moderate This was moderate in 2019.  I would not suspect this was changed.  No change in therapy.   Hypertension The blood pressure is labile but most recently well controlled.  I agreed not to go up on her blood pressure medications at this time but she needs to give me a blood pressure diary and if it is elevated I will need to go up.    PVD (peripheral vascular disease) (HCC) She has had some vague leg symptoms recently and has seen Dr. Randie Heinz and is due to get some peripheral vascular studies.   Stress She is actively having this managed by Dr. Lawerance Bach.  She was recently given citalopram but since she did not tolerate this after few doses and is let Dr. Lawerance Bach know that she is not taking it.   Palpitations I  am going to apply a 3-day monitor.  Covid education She has been vaccinated.  Current medicines are reviewed at length with the patient today.  The patient does not have concerns regarding medicines.  The following changes have been made:  None  Labs/ tests ordered today include: None  Orders Placed This Encounter  Procedures  . LONG TERM MONITOR (3-14 DAYS)  . EKG 12-Lead     Disposition:   FU with me 3 months    Signed, Rollene Rotunda, MD  08/14/2019 5:03 PM    [facility]

## 2019-08-14 ENCOUNTER — Other Ambulatory Visit: Payer: Self-pay

## 2019-08-14 ENCOUNTER — Ambulatory Visit: Payer: Medicare HMO | Admitting: Cardiology

## 2019-08-14 ENCOUNTER — Encounter: Payer: Self-pay | Admitting: *Deleted

## 2019-08-14 ENCOUNTER — Encounter: Payer: Self-pay | Admitting: Cardiology

## 2019-08-14 VITALS — BP 166/72 | HR 81 | Temp 96.3°F | Ht 62.0 in | Wt 144.2 lb

## 2019-08-14 DIAGNOSIS — R002 Palpitations: Secondary | ICD-10-CM | POA: Diagnosis not present

## 2019-08-14 DIAGNOSIS — Z7189 Other specified counseling: Secondary | ICD-10-CM | POA: Diagnosis not present

## 2019-08-14 DIAGNOSIS — I2510 Atherosclerotic heart disease of native coronary artery without angina pectoris: Secondary | ICD-10-CM

## 2019-08-14 DIAGNOSIS — I350 Nonrheumatic aortic (valve) stenosis: Secondary | ICD-10-CM | POA: Diagnosis not present

## 2019-08-14 DIAGNOSIS — I10 Essential (primary) hypertension: Secondary | ICD-10-CM | POA: Diagnosis not present

## 2019-08-14 NOTE — Patient Instructions (Signed)
Medication Instructions:  No changes *If you need a refill on your cardiac medications before your next appointment, please call your pharmacy*  Lab Work: None ordered this visit  Testing/Procedures: Your physician has recommended that you wear a 3 DAY ZIO-PATCH monitor. The Zio patch cardiac monitor continuously records heart rhythm data for up to 14 days, this is for patients being evaluated for multiple types heart rhythms. For the first 24 hours post application, please avoid getting the Zio monitor wet in the shower or by excessive sweating during exercise. After that, feel free to carry on with regular activities. Keep soaps and lotions away from the ZIO XT Patch.  This will be mailed to you, please expect 7-10 days to receive.        Follow-Up: At [facility], you and your health needs are our priority.  As part of our continuing mission to provide you with exceptional heart care, we have created designated Provider Care Teams.  These Care Teams include your primary Cardiologist (physician) and Advanced Practice Providers (APPs -  Physician Assistants and Nurse Practitioners) who all work together to provide you with the care you need, when you need it.   Your next appointment:   3 month(s)  The format for your next appointment:   In Person  Provider:   [name], MD  Other Instructions TAKE YOUR BLOOD PRESSURE TWICE A DAY FOR A WEEK TO 10 DAYS  [place] Monitor Instructions   Your physician has requested you wear your ZIO patch monitor 3 days.   This is a single patch monitor.  [employer] supplies one patch monitor per enrollment.  Additional stickers are not available.   Please do not apply patch if you will be having a Nuclear Stress Test, Echocardiogram, Cardiac CT, MRI, or Chest Xray during the time frame you would be wearing the monitor. The patch cannot be worn during these tests.  You cannot remove and re-apply the ZIO XT patch monitor.   Your ZIO  patch monitor will be sent USPS Priority mail from [facility] directly to your home address. The monitor may also be mailed to a PO BOX if home delivery is not available.   It may take 3-5 days to receive your monitor after you have been enrolled.   Once you have received you monitor, please review enclosed instructions.  Your monitor has already been registered assigning a specific monitor serial # to you.   Applying the monitor   Shave hair from upper left chest.   Hold abrader disc by orange tab.  Rub abrader in 40 strokes over left upper chest as indicated in your monitor instructions.   Clean area with 4 enclosed alcohol pads .  Use all pads to assure are is cleaned thoroughly.  Let dry.   Apply patch as indicated in monitor instructions.  Patch will be place under collarbone on left side of chest with arrow pointing upward.   Rub patch adhesive wings for 2 minutes.Remove white label marked "1".  Remove white label marked "2".  Rub patch adhesive wings for 2 additional minutes.   While looking in a mirror, press and release button in center of patch.  A small green light will flash 3-4 times .  This will be your only indicator the monitor has been turned on.     Do not shower for the first 24 hours.  You may shower after the first 24 hours.   Press button if you feel a  symptom. You will hear a small click.  Record Date, Time and Symptom in the Patient Log Book.   When you are ready to remove patch, follow instructions on last 2 pages of Patient Log Book.  Stick patch monitor onto last page of Patient Log Book.   Place Patient Log Book in [place] box.  Use locking tab on box and tape box closed securely.  The [employer] has [employer] on it.  Please place in mailbox as soon as possible.  Your physician should have your test results approximately 7 days after the monitor has been mailed back to [facility].   Call [place] at [phone] if  you have questions regarding your ZIO XT patch monitor.  Call them immediately if you see an orange light blinking on your monitor.   If your monitor falls off in less than 4 days contact our Monitor department at [phone].  If your monitor becomes loose or falls off after 4 days call [employer] at [phone] for suggestions on securing your monitor.

## 2019-08-14 NOTE — Progress Notes (Signed)
Patient ID: Tamara Robertson, female   DOB: 09-Nov-1929, 86 y.o.   MRN: [id number] Patient enrolled for 3 day ZIO XT long term holter monitor to be mailed to her home.

## 2019-08-17 NOTE — Progress Notes (Signed)
Subjective: Tamara Robertson presents today for follow up of for at risk foot care. Patient has h/o PAD with claudication. She has h/o placement of SFA stent.  She is see for painful mycotic nails b/l that are difficult to trim. Pain interferes with ambulation. Aggravating factors include wearing enclosed shoe gear. Pain is relieved with periodic professional debridement.   Allergies  Allergen Reactions  . Aspirin Other (See Comments)    ITP  . Sulfa Antibiotics Other (See Comments)    dizziness  . Contrast Media [Iodinated Diagnostic Agents] Hives    CAT scan contrast only  . Lexapro [Escitalopram]     Hypertension  . Fluoxetine     Did not feel well on it   . Statins Other (See Comments)     Objective: Vitals:   08/12/19 1457  Temp: 97.8 F (36.6 C)    Pt 86 y.o. year old Caucasian female  in NAD. AAO x 3.   Vascular Examination:  Capillary refill time to digits immediate b/l. Faintly palpable DP pulses b/l. Faintly palpable PT pulses b/l. Pedal hair present b/l. Skin temperature gradient within normal limits b/l.  Dermatological Examination: Pedal skin with normal turgor, texture and tone bilaterally. No open wounds bilaterally. No interdigital macerations bilaterally. Toenails 1-5 b/l elongated, dystrophic, thickened, crumbly with subungual debris and tenderness to dorsal palpation. Hyperkeratotic lesion(s) R hallux and R 2nd toe.  No erythema, no edema, no drainage, no flocculence.  Musculoskeletal: Normal muscle strength 5/5 to all lower extremity muscle groups bilaterally, no pain crepitus or joint limitation noted with ROM b/l, bunion deformity noted b/l and hammertoes noted to the  R 2nd toe.  Neurological: Protective sensation intact 5/5 intact bilaterally with 10g monofilament b/l Vibratory sensation intact b/l  Assessment: 1. Pain due to onychomycosis of toenails of both feet   2. Corns and callosities   3. Peripheral vascular disease with claudication (HCC)     Plan: -Toenails 1-5 b/l were debrided in length and girth with sterile nail nippers and dremel without iatrogenic bleeding.  -Corn(s) R 2nd toe and callus(es) R hallux were debrided without complication or incident. Total number debrided =2. -Patient to continue soft, supportive shoe gear daily. -Patient to report any pedal injuries to medical professional immediately. -Patient/POA to call should there be question/concern in the interim.  Return in about 3 months (around 11/11/2019).

## 2019-08-18 DIAGNOSIS — G8921 Chronic pain due to trauma: Secondary | ICD-10-CM | POA: Diagnosis not present

## 2019-08-18 DIAGNOSIS — I10 Essential (primary) hypertension: Secondary | ICD-10-CM | POA: Diagnosis not present

## 2019-08-18 DIAGNOSIS — R42 Dizziness and giddiness: Secondary | ICD-10-CM | POA: Diagnosis not present

## 2019-08-18 DIAGNOSIS — G4489 Other headache syndrome: Secondary | ICD-10-CM | POA: Diagnosis not present

## 2019-08-18 DIAGNOSIS — M542 Cervicalgia: Secondary | ICD-10-CM | POA: Diagnosis not present

## 2019-08-18 DIAGNOSIS — G629 Polyneuropathy, unspecified: Secondary | ICD-10-CM | POA: Diagnosis not present

## 2019-08-18 DIAGNOSIS — I739 Peripheral vascular disease, unspecified: Secondary | ICD-10-CM | POA: Diagnosis not present

## 2019-08-18 DIAGNOSIS — M1712 Unilateral primary osteoarthritis, left knee: Secondary | ICD-10-CM | POA: Diagnosis not present

## 2019-08-18 DIAGNOSIS — I2510 Atherosclerotic heart disease of native coronary artery without angina pectoris: Secondary | ICD-10-CM | POA: Diagnosis not present

## 2019-08-18 DIAGNOSIS — G8929 Other chronic pain: Secondary | ICD-10-CM | POA: Diagnosis not present

## 2019-08-19 ENCOUNTER — Ambulatory Visit (INDEPENDENT_AMBULATORY_CARE_PROVIDER_SITE_OTHER): Payer: Medicare HMO

## 2019-08-19 DIAGNOSIS — R002 Palpitations: Secondary | ICD-10-CM

## 2019-08-26 DIAGNOSIS — I10 Essential (primary) hypertension: Secondary | ICD-10-CM | POA: Diagnosis not present

## 2019-08-26 DIAGNOSIS — G8921 Chronic pain due to trauma: Secondary | ICD-10-CM | POA: Diagnosis not present

## 2019-08-26 DIAGNOSIS — G4489 Other headache syndrome: Secondary | ICD-10-CM | POA: Diagnosis not present

## 2019-08-26 DIAGNOSIS — I739 Peripheral vascular disease, unspecified: Secondary | ICD-10-CM | POA: Diagnosis not present

## 2019-08-26 DIAGNOSIS — M542 Cervicalgia: Secondary | ICD-10-CM | POA: Diagnosis not present

## 2019-08-26 DIAGNOSIS — I2510 Atherosclerotic heart disease of native coronary artery without angina pectoris: Secondary | ICD-10-CM | POA: Diagnosis not present

## 2019-08-26 DIAGNOSIS — G629 Polyneuropathy, unspecified: Secondary | ICD-10-CM | POA: Diagnosis not present

## 2019-08-26 DIAGNOSIS — R42 Dizziness and giddiness: Secondary | ICD-10-CM | POA: Diagnosis not present

## 2019-08-26 DIAGNOSIS — G8929 Other chronic pain: Secondary | ICD-10-CM | POA: Diagnosis not present

## 2019-08-26 DIAGNOSIS — M1712 Unilateral primary osteoarthritis, left knee: Secondary | ICD-10-CM | POA: Diagnosis not present

## 2019-08-28 ENCOUNTER — Encounter: Payer: Self-pay | Admitting: Internal Medicine

## 2019-08-28 ENCOUNTER — Other Ambulatory Visit: Payer: Self-pay | Admitting: Internal Medicine

## 2019-09-01 DIAGNOSIS — R002 Palpitations: Secondary | ICD-10-CM | POA: Diagnosis not present

## 2019-09-02 ENCOUNTER — Encounter: Payer: Self-pay | Admitting: Internal Medicine

## 2019-09-02 ENCOUNTER — Telehealth: Payer: Self-pay

## 2019-09-02 NOTE — Telephone Encounter (Signed)
Pt called to move appt up to next month instead of July, as she is c/o dull pain in her legs throughout the day. She will call us back if anything changes/worsens.

## 2019-09-03 ENCOUNTER — Telehealth: Payer: Self-pay | Admitting: Cardiology

## 2019-09-03 NOTE — Telephone Encounter (Signed)
New Message   Patient is calling in to get the results for the monitor. Please give a call back to discuss.

## 2019-09-04 NOTE — Telephone Encounter (Signed)
Spoke with patient. Informed patient we are awaiting final review by Dr. Percival Spanish, patient verbalized understanding.

## 2019-09-14 DIAGNOSIS — I10 Essential (primary) hypertension: Secondary | ICD-10-CM | POA: Diagnosis not present

## 2019-09-14 DIAGNOSIS — Z008 Encounter for other general examination: Secondary | ICD-10-CM | POA: Diagnosis not present

## 2019-09-14 DIAGNOSIS — R69 Illness, unspecified: Secondary | ICD-10-CM | POA: Diagnosis not present

## 2019-09-14 DIAGNOSIS — G8929 Other chronic pain: Secondary | ICD-10-CM | POA: Diagnosis not present

## 2019-09-14 DIAGNOSIS — I25119 Atherosclerotic heart disease of native coronary artery with unspecified angina pectoris: Secondary | ICD-10-CM | POA: Diagnosis not present

## 2019-09-14 DIAGNOSIS — H04129 Dry eye syndrome of unspecified lacrimal gland: Secondary | ICD-10-CM | POA: Diagnosis not present

## 2019-09-14 DIAGNOSIS — I739 Peripheral vascular disease, unspecified: Secondary | ICD-10-CM | POA: Diagnosis not present

## 2019-09-14 DIAGNOSIS — H409 Unspecified glaucoma: Secondary | ICD-10-CM | POA: Diagnosis not present

## 2019-09-14 DIAGNOSIS — E663 Overweight: Secondary | ICD-10-CM | POA: Diagnosis not present

## 2019-09-14 DIAGNOSIS — M0550 Rheumatoid polyneuropathy with rheumatoid arthritis of unspecified site: Secondary | ICD-10-CM | POA: Diagnosis not present

## 2019-09-14 DIAGNOSIS — D691 Qualitative platelet defects: Secondary | ICD-10-CM | POA: Diagnosis not present

## 2019-09-24 ENCOUNTER — Other Ambulatory Visit (HOSPITAL_COMMUNITY): Payer: Self-pay | Admitting: Physician Assistant

## 2019-09-24 DIAGNOSIS — I739 Peripheral vascular disease, unspecified: Secondary | ICD-10-CM

## 2019-09-25 ENCOUNTER — Ambulatory Visit (HOSPITAL_COMMUNITY)
Admission: RE | Admit: 2019-09-25 | Discharge: 2019-09-25 | Disposition: A | Discharge status: Home / Self Care | Payer: Medicare HMO | Source: Ambulatory Visit | Attending: Vascular Surgery | Admitting: Vascular Surgery

## 2019-09-25 ENCOUNTER — Ambulatory Visit (INDEPENDENT_AMBULATORY_CARE_PROVIDER_SITE_OTHER): Payer: Medicare HMO | Admitting: Physician Assistant

## 2019-09-25 ENCOUNTER — Other Ambulatory Visit: Payer: Self-pay

## 2019-09-25 VITALS — BP 146/77 | HR 73 | Temp 97.6°F | Resp 14 | Ht 62.0 in | Wt 140.0 lb

## 2019-09-25 DIAGNOSIS — G6289 Other specified polyneuropathies: Secondary | ICD-10-CM | POA: Diagnosis not present

## 2019-09-25 DIAGNOSIS — I739 Peripheral vascular disease, unspecified: Secondary | ICD-10-CM

## 2019-09-25 DIAGNOSIS — I83892 Varicose veins of left lower extremities with other complications: Secondary | ICD-10-CM | POA: Diagnosis not present

## 2019-09-25 NOTE — Progress Notes (Signed)
Established Intermittent Claudication   History of Present Illness   Tamara Robertson is a 86 y.o. (07-26-1929) female who presents with chief complaint of dull ache in left lower extremity.  She has a history of a left SFA stent placed in 2011 in [place] and her PAD has been followed by Dr. Randie Heinz.  She called the office to move her appointment up due to an increase in pain of her left lower extremity.  She states the pain is mainly in her left thigh and buttock and this comes and goes.  She denies any pain in her left foot.  She also denies any nonhealing wounds of bilateral lower extremities.  In the past she has also had right greater saphenous vein stripping.  She has varicosities of left lower extremity however after discussions with Dr. Randie Heinz it has been decided to hold off on any venous intervention due to her underlying arterial disease.  Patient states since the death of her husband she is unable to put on or take off her compression stockings and thus has not been able to wear any type of support stockings.  She is trying to elevate her leg during the day in her recliner.  She denies any bleeding or venous ulcerations of bilateral lower extremities.  She also states she has been diagnosed with arthritis in her lumbar spine as well as her hips and has been told before that she has neuropathy in her feet.  Patient admits she was mainly concerned about the stent in her left leg.  She denies tobacco use.  The patient's PMH, PSH, SH, and FamHx were reviewed and are unchanged from prior visit.  Current Outpatient Medications  Medication Sig Dispense Refill  . acetaminophen (TYLENOL) 500 MG tablet Take 500 mg by mouth every 6 (six) hours as needed.    . Biotin 2500 MCG CAPS Take 1 capsule by mouth daily.     . calcium carbonate (TUMS - DOSED IN MG ELEMENTAL CALCIUM) 500 MG chewable tablet Chew 1 tablet by mouth daily as needed for indigestion or heartburn.    .  Calcium-Magnesium-Vitamin D (CITRACAL CALCIUM+D) 600-40-500 MG-MG-UNIT TB24 Take 1 tablet by mouth daily.     . cycloSPORINE (RESTASIS) 0.05 % ophthalmic emulsion 1 drop every 12 (twelve) hours.    . dorzolamide-timolol (COSOPT) 22.3-6.8 MG/ML ophthalmic solution Place 1 drop into both eyes 2 (two) times daily. 10 mL 12  . famotidine (PEPCID) 20 MG tablet Take 1 tablet (20 mg total) by mouth 2 (two) times daily as needed for heartburn or indigestion. 60 tablet 3  . Flaxseed MISC Take 5 mLs by mouth daily. Take 1 tsp daily    . furosemide (LASIX) 20 MG tablet TAKE 1 TABLET BY MOUTH AS NEEDED 90 tablet 2  . Glucos-Chond-Hyal Ac-Ca Fructo (MOVE FREE JOINT HEALTH ADVANCE PO) Take by mouth.    . losartan (COZAAR) 25 MG tablet Take 1 tablet (25 mg total) by mouth 2 (two) times daily. 180 tablet 1  . Magnesium Oxide 250 MG TABS Take by mouth.    . Multiple Vitamin (MULTIVITAMIN) tablet Take 1 tablet by mouth daily.      . naproxen sodium (ALEVE) 220 MG tablet Take 220 mg by mouth as needed.     . polyethylene glycol (MIRALAX / GLYCOLAX) packet Take 17 g by mouth daily as needed for moderate constipation.     . predniSONE (DELTASONE) 2.5 MG tablet TAKE 1 TABLET BY MOUTH EVERY OTHER DAY  45 tablet 1  . Probiotic Product (PROBIOTIC DAILY PO) Take by mouth.    . psyllium (METAMUCIL) 58.6 % packet Take 1 packet by mouth daily as needed.    [name] ascorbic acid (VITAMIN C) 500 MG tablet Take by mouth.    . citalopram (CELEXA) 10 MG tablet Take 1 tablet (10 mg total) by mouth daily. 30 tablet 5  . doxycycline (PERIOSTAT) 20 MG tablet Take 20 mg by mouth daily.    [name] triamcinolone cream (KENALOG) 0.1 % APPLY CREAM EXTERNALLY TWICE DAILY FOR 30 DAYS     No current facility-administered medications for this visit.    REVIEW OF SYSTEMS (negative unless checked):   Cardiac:  []  Chest pain or chest pressure? []  Shortness of breath upon activity? []  Shortness of breath when lying flat? []  Irregular heart  rhythm?  Vascular:  []  Pain in calf, thigh, or hip brought on by walking? []  Pain in feet at night that wakes you up from your sleep? []  Blood clot in your veins? []  Leg swelling?  Pulmonary:  []  Oxygen at home? []  Productive cough? []  Wheezing?  Neurologic:  []  Sudden weakness in arms or legs? []  Sudden numbness in arms or legs? []  Sudden onset of difficult speaking or slurred speech? []  Temporary loss of vision in one eye? []  Problems with dizziness?  Gastrointestinal:  []  Blood in stool? []  Vomited blood?  Genitourinary:  []  Burning when urinating? []  Blood in urine?  Psychiatric:  []  Major depression  Hematologic:  []  Bleeding problems? []  Problems with blood clotting?  Dermatologic:  []  Rashes or ulcers?  Constitutional:  []  Fever or chills?  Ear/Nose/Throat:  []  Change in hearing? []  Nose bleeds? []  Sore throat?  Musculoskeletal:  []  Back pain? []  Joint pain? []  Muscle pain?   Physical Examination   Vitals:   09/25/19 1424  BP: (!) 146/77  Pulse: 73  Resp: 14  Temp: 97.6 F (36.4 C)  TempSrc: Temporal  SpO2: 100%  Weight: 140 lb (63.5 kg)  Height: 5\' 2"  (1.575 m)   Body mass index is 25.61 kg/m.  General:  WDWN in NAD; vital signs documented above Gait: Not observed HENT: WNL, normocephalic Pulmonary: normal non-labored breathing , without Rales, rhonchi,  wheezing Cardiac: regular HR Abdomen: soft, NT, no masses Skin: without rashes Vascular Exam/Pulses:  Right Left  Radial 2+ (normal) 2+ (normal)  DP 1+ (weak) 2+ (normal)  PT 1+ (weak) absent   Extremities: without ischemic changes, without Gangrene , without cellulitis; without open wounds; she has ropey varicosities along the path of her greater saphenous vein in her left lower extremity she also has prominent veins in her ankles and feet Musculoskeletal: no muscle wasting or atrophy  Neurologic: A&O X 3;  No focal weakness or paresthesias are detected Psychiatric:  The pt  has Normal affect.  Non-Invasive Vascular imaging    ABI  ABI/TBIToday's ABIToday's TBIPrevious ABIPrevious TBI  +-------+-----------+-----------+------------+------------+  Right 0.93    0.57    0.95    0.47      +-------+-----------+-----------+------------+------------+  Left  0.91    0.54    0.93    0.63    Medical Decision Making   Tamara Robertson is a 86 y.o. female who presents due to worsening pain in her left lower extremity with concern for thrombosis of left SFA stent   Left lower extremity is well-perfused with a palpable 2+ DP pulse and an ABI within normal limits  Patient was assured she has adequate  perfusion to bilateral lower extremities; etiology of her thigh pain and symmetrical paresthesias of her feet are unknown however likely due to her previously diagnosed lumbar stenosis and neuropathy  I stressed the importance of some form of supportive stocking for bilateral lower extremities and patient states she plans to have her son drive her to the [employer] [place] to find something that she is able to get on and off without assistance  I encouraged her to continue to elevate her legs when possible during the day  Due to the vague symptoms of bilateral lower extremities and despite palpable pulses and normal ABIs, we will reschedule her exercise ABIs and left lower extremity duplex for 6 months  She will return to office sooner if any rest pain or nonhealing wounds develop   Emilie Rutter PA-C [facility] Office: [phone]  Clinic MD: Randie Heinz

## 2019-09-27 NOTE — Progress Notes (Signed)
Cardiology Office Note   Date:  09/28/2019   ID:  Locklynn, Huval [birth year], MRN [id number]   PCP:  Pincus Sanes, MD  Cardiologist:   No primary care provider on file.   Chief Complaint  Patient presents with  . Dizziness     History of Present Illness: Gayle Ellner is a 86 y.o. female who presents for evaluation of PT.  She has had non obstructive CAD.    She had remote PTA in [facility], and chronic LE edema.   She returns for one year follow up.    Since I last saw her she sent me some BP readings and they looked good.  She wore a monitor and she had short runs of SVT.  I did not change her therapy.  She came back today and we reviewed her blood pressure diary.  Her blood pressure is actually well controlled although it is elevated today.  We talked about the palpitations and I reviewed with her her monitor and she probably is feeling the brief runs of SVT.  However, she is not had any presyncope or syncope.  She has no chest pressure, neck or arm discomfort.  She is having mostly problems with her gait and worries about whether she is a fall risk.  He did fall once earlier this year as described previously.   Past Medical History:  Diagnosis Date  . Anxiety   . CAD (coronary artery disease)    RCA 40% stenosis cath 01/2011  . Cholelithiasis    s/p lap chole 09/2014  . Diverticular stricture [facility]) 2006   [facility]  . DIVERTICULITIS, HX OF   . Diverticulosis   . DYSLIPIDEMIA   . Elevated LFTs   . GERD   . Glaucoma   . Hepatic steatosis   . HOH (hard of hearing)   . Immune thrombocytopenic purpura (HCC)    chronic - baseline 80-100K, on pred  . Irritable bowel syndrome   . Left ovarian cyst dx 01/2013 CT   working with gyn, ?malignant - elevated tumor marker OVA1  . OSTEOARTHRITIS, KNEE, RIGHT   . OSTEOPENIA   . OVERACTIVE BLADDER   . Prolapse of female bladder, acquired 11/2017  . UNSPECIFIED PERIPHERAL VASCULAR DISEASE   .  URINARY INCONTINENCE     Past Surgical History:  Procedure Laterality Date  . ABDOMINAL HYSTERECTOMY  1963  . ANGIOPLASTY    . APPENDECTOMY  1956  . CARDIAC CATHETERIZATION    . CATARACT EXTRACTION, BILATERAL  10/2010  . CHOLECYSTECTOMY N/A 09/16/2014   Procedure: LAPAROSCOPIC CHOLECYSTECTOMY ;  Surgeon: Emelia Loron, MD;  Location: [facility] OR;  Service: General;  Laterality: N/A;  . KNEE ARTHROSCOPY Right   . L pop PTA  10/2009   stent  . LAPAROSCOPIC SIGMOID COLECTOMY  10/2005  . SPLENECTOMY  1954  . VARICOSE VEIN SURGERY Right 1962     Current Outpatient Medications  Medication Sig Dispense Refill  . acetaminophen (TYLENOL) 500 MG tablet Take 500 mg by mouth every 6 (six) hours as needed.    . Biotin 2500 MCG CAPS Take 1 capsule by mouth daily.     . calcium carbonate (TUMS - DOSED IN MG ELEMENTAL CALCIUM) 500 MG chewable tablet Chew 1 tablet by mouth daily as needed for indigestion or heartburn.    . Calcium-Magnesium-Vitamin D (CITRACAL CALCIUM+D) 600-40-500 MG-MG-UNIT TB24 Take 1 tablet by mouth daily.     . cycloSPORINE (RESTASIS) 0.05 % ophthalmic  emulsion 1 drop every 12 (twelve) hours.    . dorzolamide-timolol (COSOPT) 22.3-6.8 MG/ML ophthalmic solution Place 1 drop into both eyes 2 (two) times daily. 10 mL 12  . famotidine (PEPCID) 20 MG tablet Take 1 tablet (20 mg total) by mouth 2 (two) times daily as needed for heartburn or indigestion. 60 tablet 3  . Flaxseed MISC Take 5 mLs by mouth daily. Take 1 tsp daily    . furosemide (LASIX) 20 MG tablet TAKE 1 TABLET BY MOUTH AS NEEDED 90 tablet 2  . Glucos-Chond-Hyal Ac-Ca Fructo (MOVE FREE JOINT HEALTH ADVANCE PO) Take by mouth.    . losartan (COZAAR) 25 MG tablet Take 1 tablet (25 mg total) by mouth 2 (two) times daily. 180 tablet 1  . Magnesium Oxide 250 MG TABS Take by mouth.    . Multiple Vitamin (MULTIVITAMIN) tablet Take 1 tablet by mouth daily.      . naproxen sodium (ALEVE) 220 MG tablet Take 220 mg by mouth as needed.      . polyethylene glycol (MIRALAX / GLYCOLAX) packet Take 17 g by mouth daily as needed for moderate constipation.     . predniSONE (DELTASONE) 2.5 MG tablet TAKE 1 TABLET BY MOUTH EVERY OTHER DAY 45 tablet 1  . Probiotic Product (PROBIOTIC DAILY PO) Take by mouth.    . psyllium (METAMUCIL) 58.6 % packet Take 1 packet by mouth daily as needed.    . triamcinolone cream (KENALOG) 0.1 % APPLY CREAM EXTERNALLY TWICE DAILY FOR 30 DAYS     No current facility-administered medications for this visit.    Allergies:   Aspirin, Sulfa antibiotics, Contrast media [iodinated diagnostic agents], Lexapro [escitalopram], Fluoxetine, Celexa [citalopram], and Statins    ROS:  Please see the history of present illness.   Otherwise, review of systems are positive for none.   All other systems are reviewed and negative.     PHYSICAL EXAM: VS:  BP (!) 170/72   Pulse 75   Ht 5\' 2"  (1.575 m)   Wt 145 lb 6.4 oz (66 kg)   SpO2 100%   BMI 26.59 kg/m  , BMI Body mass index is 26.59 kg/m. GENERAL:  Well appearing NECK:  No jugular venous distention, waveform within normal limits, carotid upstroke brisk and symmetric, no bruits, no thyromegaly LUNGS:  Clear to auscultation bilaterally CHEST:  Unremarkable HEART:  PMI not displaced or sustained,S1 and S2 within normal limits, no S3, no S4, no clicks, no rubs, 2 out of 6 brief apical systolic murmur nonradiating, no diastolic murmurs ABD:  Flat, positive bowel sounds normal in frequency in pitch, no bruits, no rebound, no guarding, no midline pulsatile mass, no hepatomegaly, no splenomegaly EXT:  2 plus pulses throughout, no edema, no cyanosis no clubbing   EKG:  EKG is not ordered today.   Recent Labs: 11/19/2018: ALT 12; BUN 19; Creatinine, Ser 0.90; Potassium 4.6; Sodium 140 12/23/2018: Hemoglobin 12.5; Platelet Count 200 02/11/2019: TSH 2.23    Lipid Panel    Component Value Date/Time   CHOL 196 07/04/2017 1150   CHOL 215 (H) 06/22/2014 0825   TRIG  193 (H) 06/30/2017 0708   TRIG 161 (H) 06/22/2014 0825   TRIG 228 02/20/2010 0000   HDL 58.40 05/23/2017 1151   HDL 51 06/22/2014 0825   CHOLHDL 3 05/23/2017 1151   VLDL 23.0 05/23/2017 1151   LDLCALC 107 (H) 05/23/2017 1151   LDLCALC 132 (H) 06/22/2014 0825   LDLDIRECT 155.9 03/19/2014 1610  Wt Readings from Last 3 Encounters:  09/28/19 145 lb 6.4 oz (66 kg)  09/25/19 140 lb (63.5 kg)  08/14/19 144 lb 3.2 oz (65.4 kg)      Other studies Reviewed: Additional studies/ records that were reviewed today include: Monitor, BP diary. Review of the above records demonstrates:  NA   ASSESSMENT AND PLAN:   CAD (coronary artery disease) The patient has no new sypmtoms.  No further cardiovascular testing is indicated.  We will continue with aggressive risk reduction and meds as listed.    Aortic stenosis, moderate There was moderate stenosis.  No change in therapy.  I will follow this clinically.  Hypertension The blood pressure is a little bit labile.  We decided to not push the medicines however.   Palpitations She had I had a discussion about this.  She is rather not add another therapy has these are not particularly symptomatic.  No change in therapy.  Covid education She has been vaccinated.  Current medicines are reviewed at length with the patient today.  The patient does not have concerns regarding medicines.  The following changes have been made:  None  Labs/ tests ordered today include: None  No orders of the defined types were placed in this encounter.    Disposition:   FU with me 6 months    Signed, Rollene Rotunda, MD  09/28/2019 8:24 PM    [facility]

## 2019-09-28 ENCOUNTER — Encounter: Payer: Self-pay | Admitting: Cardiology

## 2019-09-28 ENCOUNTER — Other Ambulatory Visit: Payer: Self-pay

## 2019-09-28 ENCOUNTER — Ambulatory Visit: Payer: Medicare HMO | Admitting: Cardiology

## 2019-09-28 VITALS — BP 170/72 | HR 75 | Ht 62.0 in | Wt 145.4 lb

## 2019-09-28 DIAGNOSIS — I10 Essential (primary) hypertension: Secondary | ICD-10-CM

## 2019-09-28 DIAGNOSIS — I2510 Atherosclerotic heart disease of native coronary artery without angina pectoris: Secondary | ICD-10-CM

## 2019-09-28 DIAGNOSIS — I350 Nonrheumatic aortic (valve) stenosis: Secondary | ICD-10-CM

## 2019-09-28 DIAGNOSIS — Z7189 Other specified counseling: Secondary | ICD-10-CM | POA: Diagnosis not present

## 2019-09-28 DIAGNOSIS — I739 Peripheral vascular disease, unspecified: Secondary | ICD-10-CM | POA: Diagnosis not present

## 2019-09-28 NOTE — Patient Instructions (Signed)
Medication Instructions:  °No changes °*If you need a refill on your cardiac medications before your next appointment, please call your pharmacy* ° °Lab Work: °None ordered this visit ° °Testing/Procedures: °None ordered this visit ° °Follow-Up: °At [facility], you and your health needs are our priority.  As part of our continuing mission to provide you with exceptional heart care, we have created designated Provider Care Teams.  These Care Teams include your primary Cardiologist (physician) and Advanced Practice Providers (APPs -  Physician Assistants and Nurse Practitioners) who all work together to provide you with the care you need, when you need it. ° °Your next appointment:   °6 month(s)  °You will receive a reminder letter in the mail two months in advance. If you don't receive a letter, please call our office to schedule the follow-up appointment. ° °The format for your next appointment:   °In Person ° °Provider:   °James Hochrein, MD ° °

## 2019-09-29 ENCOUNTER — Other Ambulatory Visit: Payer: Self-pay | Admitting: *Deleted

## 2019-09-29 DIAGNOSIS — I739 Peripheral vascular disease, unspecified: Secondary | ICD-10-CM

## 2019-10-01 ENCOUNTER — Ambulatory Visit: Payer: Medicare HMO | Admitting: Internal Medicine

## 2019-10-01 NOTE — Progress Notes (Signed)
Subjective:    Patient ID: Tamara Robertson, female    DOB: 1929/08/28, 86 y.o.   MRN: [id number]  HPI The patient is here for follow up of their chronic medical problems, including hypertension.  She needs referrals.    She is taking all of her medications as prescribed.    She does not walk straight - veers to the right.  Left heel pain.  numbness in left leg at times w/ and w/o walking.  Has seen cardio and vascular - both agree could be orthopedic - ? From neck or back.  She would like to see someone to have this evaluated further.      She saw cardiology recently for  palps, sob.  Work up done.  Not serious - did not want to change medication due to possible side effects.   BP at home 122/60, 118/66    Medications and allergies reviewed with patient and updated if appropriate.  Patient Active Problem List   Diagnosis Date Noted  . Educated about COVID-19 virus infection 08/13/2019  . Posterior neck pain 08/03/2019  . Dizziness 08/03/2019  . Other headache syndrome 08/03/2019  . Diarrhea 03/13/2019  . Nonrheumatic aortic valve stenosis 02/26/2019  . Lightheadedness 11/19/2018  . Epistaxis 11/19/2018  . 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 Rash 03/25/2018  . Chronic left-sided headaches 01/28/2018  . Depression 12/18/2017  . SBO (small bowel obstruction) (HCC) 09/20/2017  . Aortic stenosis, moderate 08/22/2017  . CAD (coronary artery disease) 08/21/2017  . Pleural effusion on right   . Streptococcal pneumonia (HCC) 06/26/2017  . Community acquired pneumonia of right lower lobe of lung 06/25/2017  . Current chronic use of systemic steroids 06/25/2017  . Chronic lower back pain 05/14/2017  . Varicose veins of left lower extremity with complications 03/05/2017  . Bilateral hearing loss 02/01/2017  . Hypertension 07/31/2016  . Leg edema 07/31/2016  . Prediabetes 06/05/2016  . Osteopenia 12/01/2015  . Anxiety 07/19/2015  . Degenerative cervical disc 12/28/2014    . Left ovarian cyst   . Peripheral neuropathy 06/03/2012  . PVD (peripheral vascular disease) (HCC) 11/23/2010  . Bursitis of hip 10/26/2010  . Hyperlipidemia 06/27/2010  . IMMUNE THROMBOCYTOPENIC PURPURA 06/27/2010  . Unspecified glaucoma 06/27/2010  . GERD 06/27/2010  . Irritable bowel syndrome 06/27/2010  . OVERACTIVE BLADDER 06/27/2010  . OSTEOARTHRITIS, KNEE, RIGHT 06/27/2010  . URINARY INCONTINENCE 06/27/2010    Current Outpatient Medications on File Prior to Visit  Medication Sig Dispense Refill  . acetaminophen (TYLENOL) 500 MG tablet Take 500 mg by mouth every 6 (six) hours as needed.    . Biotin 2500 MCG CAPS Take 1 capsule by mouth daily.     . calcium carbonate (TUMS - DOSED IN MG ELEMENTAL CALCIUM) 500 MG chewable tablet Chew 1 tablet by mouth daily as needed for indigestion or heartburn.    . Calcium-Magnesium-Vitamin D (CITRACAL CALCIUM+D) 600-40-500 MG-MG-UNIT TB24 Take 1 tablet by mouth daily.     . cycloSPORINE (RESTASIS) 0.05 % ophthalmic emulsion 1 drop every 12 (twelve) hours.    . dorzolamide-timolol (COSOPT) 22.3-6.8 MG/ML ophthalmic solution Place 1 drop into both eyes 2 (two) times daily. 10 mL 12  . famotidine (PEPCID) 20 MG tablet Take 1 tablet (20 mg total) by mouth 2 (two) times daily as needed for heartburn or indigestion. 60 tablet 3  . Flaxseed MISC Take 5 mLs by mouth daily. Take 1 tsp daily    . furosemide (LASIX) 20 MG tablet TAKE 1 TABLET  BY MOUTH AS NEEDED 90 tablet 2  . Glucos-Chond-Hyal Ac-Ca Fructo (MOVE FREE JOINT HEALTH ADVANCE PO) Take by mouth.    . losartan (COZAAR) 25 MG tablet Take 1 tablet (25 mg total) by mouth 2 (two) times daily. 180 tablet 1  . Magnesium Oxide 250 MG TABS Take by mouth.    . Multiple Vitamin (MULTIVITAMIN) tablet Take 1 tablet by mouth daily.      . naproxen sodium (ALEVE) 220 MG tablet Take 220 mg by mouth as needed.     . polyethylene glycol (MIRALAX / GLYCOLAX) packet Take 17 g by mouth daily as needed for  moderate constipation.     . predniSONE (DELTASONE) 2.5 MG tablet TAKE 1 TABLET BY MOUTH EVERY OTHER DAY 45 tablet 1  . Probiotic Product (PROBIOTIC DAILY PO) Take by mouth.    . psyllium (METAMUCIL) 58.6 % packet Take 1 packet by mouth daily as needed.     No current facility-administered medications on file prior to visit.    Past Medical History:  Diagnosis Date  . Anxiety   . CAD (coronary artery disease)    RCA 40% stenosis cath 01/2011  . Cholelithiasis    s/p lap chole 09/2014  . Diverticular stricture [facility]) 2006   [facility]  . DIVERTICULITIS, HX OF   . Diverticulosis   . DYSLIPIDEMIA   . Elevated LFTs   . GERD   . Glaucoma   . Hepatic steatosis   . HOH (hard of hearing)   . Immune thrombocytopenic purpura (HCC)    chronic - baseline 80-100K, on pred  . Irritable bowel syndrome   . Left ovarian cyst dx 01/2013 CT   working with gyn, ?malignant - elevated tumor marker OVA1  . OSTEOARTHRITIS, KNEE, RIGHT   . OSTEOPENIA   . OVERACTIVE BLADDER   . Prolapse of female bladder, acquired 11/2017  . UNSPECIFIED PERIPHERAL VASCULAR DISEASE   . URINARY INCONTINENCE     Past Surgical History:  Procedure Laterality Date  . ABDOMINAL HYSTERECTOMY  1963  . ANGIOPLASTY    . APPENDECTOMY  1956  . CARDIAC CATHETERIZATION    . CATARACT EXTRACTION, BILATERAL  10/2010  . CHOLECYSTECTOMY N/A 09/16/2014   Procedure: LAPAROSCOPIC CHOLECYSTECTOMY ;  Surgeon: Emelia Loron, MD;  Location: [facility] OR;  Service: General;  Laterality: N/A;  . KNEE ARTHROSCOPY Right   . L pop PTA  10/2009   stent  . LAPAROSCOPIC SIGMOID COLECTOMY  10/2005  . SPLENECTOMY  1954  . VARICOSE VEIN SURGERY Right 1962    Social History   Socioeconomic History  . Marital status: Widowed    Spouse name: Not on file  . Number of children: 2  . Years of education: Not on file  . Highest education level: Not on file  Occupational History  . Occupation: Retired    Comment: Investment banker, corporate  Tobacco Use  . Smoking status: Never Smoker  . Smokeless tobacco: Never Used  Substance and Sexual Activity  . Alcohol use: No    Alcohol/week: 0.0 standard drinks    Comment: rarely  . Drug use: No  . Sexual activity: Not Currently    Comment: 1st intercourse- 19, partners- 2,  widow  Other Topics Concern  . 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    [place] Ham to [place] from [place] 05/2010 to be close to kids   Social Determinants of Health  Financial Resource Strain:   . Difficulty of Paying Living Expenses:   Food Insecurity:   . Worried About Programme researcher, broadcasting/film/video in the Last Year:   . Barista in the Last Year:   Transportation Needs:   . Freight forwarder (Medical):   [name] Lack of Transportation (Non-Medical):   Physical Activity:   . Days of Exercise per Week:   . Minutes of Exercise per Session:   Stress:   . Feeling of Stress :   Social Connections:   . Frequency of Communication with Friends and Family:   . Frequency of Social Gatherings with Friends and Family:   . Attends Religious Services:   . Active Member of Clubs or Organizations:   . Attends Banker Meetings:   [name] Marital Status:     Family History  Problem Relation Age of Onset  . Coronary artery disease Mother   . Heart attack Mother 51  . Hyperlipidemia Mother   . Hypertension Mother   . Stomach cancer Father   . Hypertension Daughter   . Hyperlipidemia Daughter   . Arthritis Other        parent  . Transient ischemic attack Other        parent  . Colon cancer Neg Hx     Review of Systems  Respiratory: Positive for shortness of breath.   Cardiovascular: Positive for palpitations.  Musculoskeletal: Positive for arthralgias and back pain.       Objective:   Vitals:   10/02/19 1547  BP: (!) 162/70  Pulse: 93  Resp: 14  Temp: 98.3 F (36.8 C)  SpO2: 98%   BP Readings from Last 3 Encounters:   10/02/19 (!) 162/70  09/28/19 (!) 170/72  09/25/19 (!) 146/77   Wt Readings from Last 3 Encounters:  10/02/19 145 lb (65.8 kg)  09/28/19 145 lb 6.4 oz (66 kg)  09/25/19 140 lb (63.5 kg)   Body mass index is 26.52 kg/m.   Physical Exam    Constitutional: Appears well-developed and well-nourished. No distress.  HENT:  Head: Normocephalic and atraumatic.  Neck: Neck supple. No tracheal deviation present. No thyromegaly present.  No cervical lymphadenopathy Cardiovascular: Normal rate, regular rhythm and normal heart sounds.   No murmur heard. No carotid bruit .  No edema Pulmonary/Chest: Effort normal and breath sounds normal. No respiratory distress. No has no wheezes. No rales.  Skin: Skin is warm and dry. Not diaphoretic.  Psychiatric: Normal mood and affect. Behavior is normal.      Assessment & Plan:    See Problem List for Assessment and Plan of chronic medical problems.    This visit occurred during the SARS-CoV-2 public health emergency.  Safety protocols were in place, including screening questions prior to the visit, additional usage of staff PPE, and extensive cleaning of exam room while observing appropriate contact time as indicated for disinfecting solutions.

## 2019-10-02 ENCOUNTER — Ambulatory Visit (INDEPENDENT_AMBULATORY_CARE_PROVIDER_SITE_OTHER): Payer: Medicare HMO | Admitting: Internal Medicine

## 2019-10-02 ENCOUNTER — Other Ambulatory Visit: Payer: Self-pay

## 2019-10-02 ENCOUNTER — Encounter: Payer: Self-pay | Admitting: Internal Medicine

## 2019-10-02 VITALS — BP 162/70 | HR 93 | Temp 98.3°F | Resp 14 | Ht 62.0 in | Wt 145.0 lb

## 2019-10-02 DIAGNOSIS — I10 Essential (primary) hypertension: Secondary | ICD-10-CM

## 2019-10-02 DIAGNOSIS — M545 Low back pain, unspecified: Secondary | ICD-10-CM

## 2019-10-02 DIAGNOSIS — D693 Immune thrombocytopenic purpura: Secondary | ICD-10-CM

## 2019-10-02 DIAGNOSIS — G8929 Other chronic pain: Secondary | ICD-10-CM | POA: Diagnosis not present

## 2019-10-02 NOTE — Assessment & Plan Note (Signed)
Chronic BP well controlled at home, elevated here Current regimen effective and well tolerated Continue current medications at current doses Continue to monitor bp at home

## 2019-10-02 NOTE — Assessment & Plan Note (Signed)
Chronic Taking prednisone 2.5 mg daily which I am prescribing No concerning symptoms continue

## 2019-10-02 NOTE — Patient Instructions (Signed)
  Medications reviewed and updated.  Changes include :  none    A referral was ordered for [facility].     Someone will call you to schedule this.    Please followup in 6 months

## 2019-10-02 NOTE — Assessment & Plan Note (Signed)
Chronic lower back pain Some intermittent numbness in left leg, not able to walk straight - goes to right Will refer to [place] ortho for further evaluation/ treatment

## 2019-10-15 DIAGNOSIS — L820 Inflamed seborrheic keratosis: Secondary | ICD-10-CM | POA: Diagnosis not present

## 2019-10-15 DIAGNOSIS — L821 Other seborrheic keratosis: Secondary | ICD-10-CM | POA: Diagnosis not present

## 2019-10-15 DIAGNOSIS — S6010XA Contusion of unspecified finger with damage to nail, initial encounter: Secondary | ICD-10-CM | POA: Diagnosis not present

## 2019-10-19 DIAGNOSIS — M545 Low back pain: Secondary | ICD-10-CM | POA: Diagnosis not present

## 2019-10-19 DIAGNOSIS — M542 Cervicalgia: Secondary | ICD-10-CM | POA: Diagnosis not present

## 2019-10-29 ENCOUNTER — Other Ambulatory Visit: Payer: Self-pay | Admitting: Internal Medicine

## 2019-10-29 DIAGNOSIS — Z1231 Encounter for screening mammogram for malignant neoplasm of breast: Secondary | ICD-10-CM

## 2019-11-05 ENCOUNTER — Encounter (HOSPITAL_COMMUNITY): Payer: Medicare HMO

## 2019-11-05 ENCOUNTER — Ambulatory Visit: Payer: Medicare HMO

## 2019-11-06 DIAGNOSIS — M545 Low back pain: Secondary | ICD-10-CM | POA: Diagnosis not present

## 2019-11-06 DIAGNOSIS — M7062 Trochanteric bursitis, left hip: Secondary | ICD-10-CM | POA: Diagnosis not present

## 2019-11-06 DIAGNOSIS — S39012A Strain of muscle, fascia and tendon of lower back, initial encounter: Secondary | ICD-10-CM | POA: Diagnosis not present

## 2019-11-13 ENCOUNTER — Ambulatory Visit: Payer: Medicare HMO | Admitting: Podiatry

## 2019-11-13 DIAGNOSIS — H04123 Dry eye syndrome of bilateral lacrimal glands: Secondary | ICD-10-CM | POA: Diagnosis not present

## 2019-11-13 DIAGNOSIS — H401112 Primary open-angle glaucoma, right eye, moderate stage: Secondary | ICD-10-CM | POA: Diagnosis not present

## 2019-11-17 ENCOUNTER — Ambulatory Visit: Payer: Medicare HMO | Admitting: Cardiology

## 2019-12-01 DIAGNOSIS — M5416 Radiculopathy, lumbar region: Secondary | ICD-10-CM | POA: Diagnosis not present

## 2019-12-01 DIAGNOSIS — M7062 Trochanteric bursitis, left hip: Secondary | ICD-10-CM | POA: Diagnosis not present

## 2019-12-01 DIAGNOSIS — M419 Scoliosis, unspecified: Secondary | ICD-10-CM | POA: Diagnosis not present

## 2019-12-03 ENCOUNTER — Other Ambulatory Visit: Payer: Self-pay

## 2019-12-03 ENCOUNTER — Ambulatory Visit
Admission: RE | Admit: 2019-12-03 | Discharge: 2019-12-03 | Disposition: A | Discharge status: Home / Self Care | Payer: Medicare HMO | Source: Ambulatory Visit | Attending: Internal Medicine | Admitting: Internal Medicine

## 2019-12-03 DIAGNOSIS — Z1231 Encounter for screening mammogram for malignant neoplasm of breast: Secondary | ICD-10-CM | POA: Diagnosis not present

## 2019-12-04 ENCOUNTER — Ambulatory Visit: Payer: Medicare HMO | Admitting: Internal Medicine

## 2019-12-07 DIAGNOSIS — M5416 Radiculopathy, lumbar region: Secondary | ICD-10-CM | POA: Diagnosis not present

## 2019-12-07 DIAGNOSIS — M419 Scoliosis, unspecified: Secondary | ICD-10-CM | POA: Diagnosis not present

## 2019-12-07 DIAGNOSIS — M7062 Trochanteric bursitis, left hip: Secondary | ICD-10-CM | POA: Diagnosis not present

## 2019-12-08 ENCOUNTER — Ambulatory Visit (INDEPENDENT_AMBULATORY_CARE_PROVIDER_SITE_OTHER): Payer: Medicare HMO

## 2019-12-08 ENCOUNTER — Encounter: Payer: Self-pay | Admitting: Podiatry

## 2019-12-08 ENCOUNTER — Other Ambulatory Visit: Payer: Self-pay

## 2019-12-08 ENCOUNTER — Ambulatory Visit: Payer: Medicare HMO | Admitting: Podiatry

## 2019-12-08 DIAGNOSIS — M79675 Pain in left toe(s): Secondary | ICD-10-CM

## 2019-12-08 DIAGNOSIS — L84 Corns and callosities: Secondary | ICD-10-CM

## 2019-12-08 DIAGNOSIS — M79674 Pain in right toe(s): Secondary | ICD-10-CM

## 2019-12-08 DIAGNOSIS — M7661 Achilles tendinitis, right leg: Secondary | ICD-10-CM

## 2019-12-08 DIAGNOSIS — B351 Tinea unguium: Secondary | ICD-10-CM | POA: Diagnosis not present

## 2019-12-08 DIAGNOSIS — I739 Peripheral vascular disease, unspecified: Secondary | ICD-10-CM | POA: Diagnosis not present

## 2019-12-09 ENCOUNTER — Telehealth: Payer: Self-pay | Admitting: *Deleted

## 2019-12-09 DIAGNOSIS — M7661 Achilles tendinitis, right leg: Secondary | ICD-10-CM

## 2019-12-09 NOTE — Progress Notes (Signed)
Subjective: Teshia Mahone presents today for follow up of for at risk foot care. Patient has h/o PAD with claudication. She has h/o placement of SFA stent.  She is see for painful mycotic nails b/l that are difficult to trim. Pain interferes with ambulation. Aggravating factors include wearing enclosed shoe gear. Pain is relieved with periodic professional debridement.   She c/o painful posterior right heel at insertion of Achilles tendon for the past couple of weeks. She denies any h/o trauma. She has not attempted any treatments.   Allergies  Allergen Reactions  . Aspirin Other (See Comments)    ITP  . Sulfa Antibiotics Other (See Comments)    dizziness  . Contrast Media [Iodinated Diagnostic Agents] Hives    CAT scan contrast only  . Lexapro [Escitalopram]     Hypertension  . Fluoxetine     Did not feel well on it   . Celexa [Citalopram] Other (See Comments)    Felt out of it  . Statins Other (See Comments)     Objective: There were no vitals filed for this visit.  Pt 86 y.o. year old Caucasian female  in NAD. AAO x 3.   Vascular Examination:  Capillary refill time to digits immediate b/l. Faintly palpable DP pulses b/l. Faintly palpable PT pulses b/l. Pedal hair present b/l. Skin temperature gradient within normal limits b/l.  Dermatological Examination: Pedal skin with normal turgor, texture and tone bilaterally. No open wounds bilaterally. No interdigital macerations bilaterally. Toenails 1-5 b/l elongated, dystrophic, thickened, crumbly with subungual debris and tenderness to dorsal palpation. Hyperkeratotic lesion(s) R hallux and R 2nd toe.  No erythema, no edema, no drainage, no flocculence.  Musculoskeletal: Normal muscle strength 5/5 to all lower extremity muscle groups bilaterally, no pain crepitus or joint limitation noted with ROM b/l, bunion deformity noted b/l and hammertoes noted to the  R 2nd toe.  She has pain on palpation at insertion of Achilles tendon  RLE. +Equinus RLE. +Palpable calcaneal spur RLE. No edema, no warmth, no effusion. No palpable defect along Achilles tendon cord RLE.  Neurological: Protective sensation intact 5/5 intact bilaterally with 10g monofilament b/l Vibratory sensation intact b/l.  Xray right foot: Normal bone mineralization +posterior calcaneal spur +DJD at talonavicular joint No evidence of fracture  Assessment: 1. Pain due to onychomycosis of toenails of both feet   2. Corns and callosities   3. Peripheral vascular disease with claudication ([place])   4. Tendonitis, Achilles, right    Plan: -Toenails 1-5 b/l were debrided in length and girth with sterile nail nippers and dremel without iatrogenic bleeding.  -Corn(s) R 2nd toe and callus(es) R hallux were pared utilizing sterile scalpel blade without incident. Total number debrided =2. -Regarding Achilles tendonitis, referral made to [facility] for Achilles tendonitis RLE/Equinus/Retrocalcaneal heel spur RLE. Twice weekly for one month. -Patient to report any pedal injuries to medical professional immediately. -Xray of right foot foot was performed and reviewed with patient and/or POA. -Patient to continue soft, supportive shoe gear daily. -Patient/POA to call should there be question/concern in the interim.  Return in about 3 months (around 03/09/2020).

## 2019-12-09 NOTE — Telephone Encounter (Signed)
Faxed required form, SnapShot with clinicals and demographics to [employer].

## 2019-12-09 NOTE — Telephone Encounter (Signed)
-----   Message from Marzetta Board, DPM sent at 12/09/2019  5:49 AM EDT ----- Referral to [facility] Physical Therapy Dept for painful Achilles tendonitis of right lower extremity:Evaluate and treat painful achilles tendonitis RLE, Equinus right lower extremity, Retrocalcaneal heel spur right lower extremity.Frequency of Visits: Twice weekly for one month.Thanks!Dr. Elisha Ponder

## 2019-12-10 DIAGNOSIS — M5416 Radiculopathy, lumbar region: Secondary | ICD-10-CM | POA: Diagnosis not present

## 2019-12-10 DIAGNOSIS — M7062 Trochanteric bursitis, left hip: Secondary | ICD-10-CM | POA: Diagnosis not present

## 2019-12-10 DIAGNOSIS — M419 Scoliosis, unspecified: Secondary | ICD-10-CM | POA: Diagnosis not present

## 2019-12-15 DIAGNOSIS — M7062 Trochanteric bursitis, left hip: Secondary | ICD-10-CM | POA: Diagnosis not present

## 2019-12-15 DIAGNOSIS — M419 Scoliosis, unspecified: Secondary | ICD-10-CM | POA: Diagnosis not present

## 2019-12-15 DIAGNOSIS — M5416 Radiculopathy, lumbar region: Secondary | ICD-10-CM | POA: Diagnosis not present

## 2019-12-17 DIAGNOSIS — M7661 Achilles tendinitis, right leg: Secondary | ICD-10-CM | POA: Diagnosis not present

## 2019-12-17 DIAGNOSIS — M6281 Muscle weakness (generalized): Secondary | ICD-10-CM | POA: Diagnosis not present

## 2019-12-17 DIAGNOSIS — M25671 Stiffness of right ankle, not elsewhere classified: Secondary | ICD-10-CM | POA: Diagnosis not present

## 2019-12-22 DIAGNOSIS — M7661 Achilles tendinitis, right leg: Secondary | ICD-10-CM | POA: Diagnosis not present

## 2019-12-22 DIAGNOSIS — M6281 Muscle weakness (generalized): Secondary | ICD-10-CM | POA: Diagnosis not present

## 2019-12-22 DIAGNOSIS — M25671 Stiffness of right ankle, not elsewhere classified: Secondary | ICD-10-CM | POA: Diagnosis not present

## 2019-12-25 DIAGNOSIS — M25671 Stiffness of right ankle, not elsewhere classified: Secondary | ICD-10-CM | POA: Diagnosis not present

## 2019-12-25 DIAGNOSIS — M7661 Achilles tendinitis, right leg: Secondary | ICD-10-CM | POA: Diagnosis not present

## 2019-12-25 DIAGNOSIS — M6281 Muscle weakness (generalized): Secondary | ICD-10-CM | POA: Diagnosis not present

## 2019-12-28 DIAGNOSIS — H401112 Primary open-angle glaucoma, right eye, moderate stage: Secondary | ICD-10-CM | POA: Diagnosis not present

## 2020-01-18 ENCOUNTER — Telehealth: Payer: Self-pay | Admitting: Internal Medicine

## 2020-01-18 NOTE — Progress Notes (Signed)
Chronic Care Management   Note  01/18/2020 Name: Albirda Pauly MRN: [id number] DOB: 04/24/1930  Burnis Medin Croushore is a 86 y.o. year old female who is a primary care patient of Burns, Bobette Mo, MD. I reached out to Rudell Cobb by phone today in response to a referral sent by Ms. Burnis Medin Reveron's PCP, Pincus Sanes, MD.   Ms. Otwell was given information about Chronic Care Management services today including:  1. CCM service includes personalized support from designated clinical staff supervised by her physician, including individualized plan of care and coordination with other care providers 2. 24/7 contact phone numbers for assistance for urgent and routine care needs. 3. Service will only be billed when office clinical staff spend 20 minutes or more in a month to coordinate care. 4. Only one practitioner may furnish and bill the service in a calendar month. 5. The patient may stop CCM services at any time (effective at the end of the month) by phone call to the office staff.   Patient agreed to services and verbal consent obtained.   Follow up plan:   Carley Perdue [employer] Scheduler

## 2020-01-20 ENCOUNTER — Encounter: Payer: Self-pay | Admitting: Internal Medicine

## 2020-01-20 ENCOUNTER — Ambulatory Visit (INDEPENDENT_AMBULATORY_CARE_PROVIDER_SITE_OTHER): Payer: Medicare HMO | Admitting: Internal Medicine

## 2020-01-20 ENCOUNTER — Other Ambulatory Visit: Payer: Self-pay

## 2020-01-20 VITALS — BP 112/68 | HR 73 | Temp 97.8°F | Wt 145.0 lb

## 2020-01-20 DIAGNOSIS — F329 Major depressive disorder, single episode, unspecified: Secondary | ICD-10-CM

## 2020-01-20 DIAGNOSIS — Z23 Encounter for immunization: Secondary | ICD-10-CM

## 2020-01-20 DIAGNOSIS — D693 Immune thrombocytopenic purpura: Secondary | ICD-10-CM | POA: Diagnosis not present

## 2020-01-20 DIAGNOSIS — R7303 Prediabetes: Secondary | ICD-10-CM

## 2020-01-20 DIAGNOSIS — I10 Essential (primary) hypertension: Secondary | ICD-10-CM | POA: Diagnosis not present

## 2020-01-20 DIAGNOSIS — F419 Anxiety disorder, unspecified: Secondary | ICD-10-CM

## 2020-01-20 DIAGNOSIS — I2510 Atherosclerotic heart disease of native coronary artery without angina pectoris: Secondary | ICD-10-CM | POA: Diagnosis not present

## 2020-01-20 DIAGNOSIS — R69 Illness, unspecified: Secondary | ICD-10-CM | POA: Diagnosis not present

## 2020-01-20 DIAGNOSIS — F32A Depression, unspecified: Secondary | ICD-10-CM

## 2020-01-20 MED ORDER — FUROSEMIDE 20 MG PO TABS: 10.0000 mg | ORAL_TABLET | ORAL | 2 refills | Status: DC | PRN

## 2020-01-20 MED ORDER — ESCITALOPRAM OXALATE 5 MG PO TABS: 2.5000 mg | ORAL_TABLET | Freq: Every day | ORAL | 5 refills | Status: DC

## 2020-01-20 MED ORDER — LOSARTAN POTASSIUM 25 MG PO TABS: 25.0000 mg | ORAL_TABLET | Freq: Two times a day (BID) | ORAL | 1 refills | Status: DC

## 2020-01-20 MED ORDER — LOSARTAN POTASSIUM 25 MG PO TABS: 25.0000 mg | ORAL_TABLET | Freq: Every day | ORAL | 1 refills | Status: DC

## 2020-01-20 NOTE — Assessment & Plan Note (Addendum)
Chronic Check a1c Low sugar / carb diet As tolerated-regular exercise

## 2020-01-20 NOTE — Patient Instructions (Addendum)
Try Voltaren gel for your heel - this is over the counter.  Flu immunization administered today.    Blood work was ordered.     Medications reviewed and updated.  Changes include :   Start lexapro 2.5 mg daily.  Continue losartan at current dose for now and monitor your BP.       Please followup in 3-4 months

## 2020-01-20 NOTE — Assessment & Plan Note (Signed)
Chronic On chronic low-dose prednisone-2.5 mg  CBC today Continue prednisone at current dose

## 2020-01-20 NOTE — Assessment & Plan Note (Signed)
Chronic Following with cardiology No concerning shortness of breath or chest pain

## 2020-01-20 NOTE — Assessment & Plan Note (Signed)
Chronic Blood pressure on the low side, especially at home and she is symptomatic-lightheadedness/dizziness We discussed possibly decreasing losartan, but she would really benefit from restarting the Lexapro in the past that caused her blood pressure to go up Start Lexapro 2.5 mg daily and monitor blood pressure-hopefully this will not cause a blood pressure to go too high, but may elevated slightly we will not have to adjust her blood pressure medication She will update me via MyChart CBC, CMP
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
## 2020-01-20 NOTE — Assessment & Plan Note (Signed)
Chronic, intermittent Experiencing an element of anxiety and depression Has been on a few medications in the past and has not tolerated them well She did feel really well on low-dose Lexapro, but this did elevate her blood pressure Currently blood pressure on the low side so we will try restarting this and see how her BP does Start 2.5 mg of Lexapro daily She will monitor her BP closely and update me via MyChart

## 2020-01-21 LAB — COMPLETE METABOLIC PANEL WITH GFR
AG Ratio: 1.7 (calc) (ref 1.0–2.5)
ALT: 14 U/L (ref 6–29)
AST: 19 U/L (ref 10–35)
Albumin: 4.3 g/dL (ref 3.6–5.1)
Alkaline phosphatase (APISO): 71 U/L (ref 37–153)
BUN: 23 mg/dL (ref 7–25)
CO2: 32 mmol/L (ref 20–32)
Calcium: 9.2 mg/dL (ref 8.6–10.4)
Chloride: 103 mmol/L (ref 98–110)
Creat: 0.82 mg/dL (ref 0.60–0.88)
GFR, Est African American: 74 mL/min/{1.73_m2} (ref >=60)
GFR, Est Non African American: 64 mL/min/{1.73_m2} (ref >=60)
Globulin: 2.5 g/dL (calc) (ref 1.9–3.7)
Glucose, Bld: 95 mg/dL (ref 65–99)
Potassium: 4.6 mmol/L (ref 3.5–5.3)
Sodium: 141 mmol/L (ref 135–146)
Total Bilirubin: 0.3 mg/dL (ref 0.2–1.2)
Total Protein: 6.8 g/dL (ref 6.1–8.1)

## 2020-01-21 LAB — CBC WITH DIFFERENTIAL/PLATELET
Absolute Monocytes: 1205 cells/uL — ABNORMAL HIGH (ref 200–950)
Basophils Absolute: 82 cells/uL (ref 0–200)
Basophils Relative: 0.8 %
Eosinophils Absolute: 196 cells/uL (ref 15–500)
Eosinophils Relative: 1.9 %
HCT: 40.2 % (ref 35.0–45.0)
Hemoglobin: 13.5 g/dL (ref 11.7–15.5)
Lymphs Abs: 3481 cells/uL (ref 850–3900)
MCH: 28.8 pg (ref 27.0–33.0)
MCHC: 33.6 g/dL (ref 32.0–36.0)
MCV: 85.7 fL (ref 80.0–100.0)
MPV: 12.7 fL — ABNORMAL HIGH (ref 7.5–12.5)
Monocytes Relative: 11.7 %
Neutro Abs: 5335 cells/uL (ref 1500–7800)
Neutrophils Relative %: 51.8 %
Platelets: 174 10*3/uL (ref 140–400)
RBC: 4.69 10*6/uL (ref 3.80–5.10)
RDW: 12.8 % (ref 11.0–15.0)
Total Lymphocyte: 33.8 %
WBC: 10.3 10*3/uL (ref 3.8–10.8)

## 2020-01-21 LAB — HEMOGLOBIN A1C
Hgb A1c MFr Bld: 5.7 % of total Hgb — ABNORMAL HIGH (ref <5.7)
Mean Plasma Glucose: 117 (calc)
eAG (mmol/L): 6.5 (calc)

## 2020-01-29 ENCOUNTER — Other Ambulatory Visit: Payer: Self-pay | Admitting: Internal Medicine

## 2020-01-29 ENCOUNTER — Encounter: Payer: Self-pay | Admitting: Internal Medicine

## 2020-01-29 MED ORDER — LOSARTAN POTASSIUM 25 MG PO TABS: 25.0000 mg | ORAL_TABLET | Freq: Every day | ORAL | 1 refills | Status: DC

## 2020-02-10 ENCOUNTER — Ambulatory Visit: Payer: Medicare HMO | Admitting: Neurology

## 2020-02-12 ENCOUNTER — Encounter: Payer: Self-pay | Admitting: Internal Medicine

## 2020-02-12 DIAGNOSIS — H04123 Dry eye syndrome of bilateral lacrimal glands: Secondary | ICD-10-CM | POA: Diagnosis not present

## 2020-02-15 DIAGNOSIS — D225 Melanocytic nevi of trunk: Secondary | ICD-10-CM | POA: Diagnosis not present

## 2020-02-15 DIAGNOSIS — Z23 Encounter for immunization: Secondary | ICD-10-CM | POA: Diagnosis not present

## 2020-02-15 DIAGNOSIS — L814 Other melanin hyperpigmentation: Secondary | ICD-10-CM | POA: Diagnosis not present

## 2020-02-15 DIAGNOSIS — L570 Actinic keratosis: Secondary | ICD-10-CM | POA: Diagnosis not present

## 2020-02-15 DIAGNOSIS — L821 Other seborrheic keratosis: Secondary | ICD-10-CM | POA: Diagnosis not present

## 2020-02-15 DIAGNOSIS — L820 Inflamed seborrheic keratosis: Secondary | ICD-10-CM | POA: Diagnosis not present

## 2020-02-16 ENCOUNTER — Other Ambulatory Visit: Payer: Self-pay | Admitting: Internal Medicine

## 2020-02-25 ENCOUNTER — Other Ambulatory Visit: Payer: Self-pay

## 2020-02-25 DIAGNOSIS — N949 Unspecified condition associated with female genital organs and menstrual cycle: Secondary | ICD-10-CM

## 2020-03-02 ENCOUNTER — Other Ambulatory Visit: Payer: Self-pay

## 2020-03-02 ENCOUNTER — Ambulatory Visit: Payer: Medicare HMO | Admitting: Pharmacist

## 2020-03-02 DIAGNOSIS — I2510 Atherosclerotic heart disease of native coronary artery without angina pectoris: Secondary | ICD-10-CM

## 2020-03-02 DIAGNOSIS — F32A Depression, unspecified: Secondary | ICD-10-CM

## 2020-03-02 DIAGNOSIS — I739 Peripheral vascular disease, unspecified: Secondary | ICD-10-CM

## 2020-03-02 DIAGNOSIS — F419 Anxiety disorder, unspecified: Secondary | ICD-10-CM

## 2020-03-02 DIAGNOSIS — I10 Essential (primary) hypertension: Secondary | ICD-10-CM

## 2020-03-02 NOTE — Chronic Care Management (AMB) (Signed)
Chronic Care Management Pharmacy  Name: Heartlee Doidge  MRN: [id number] DOB: Feb 18, 1930   Chief Complaint/ HPI  Rudell Cobb,  86 y.o. , female presents for their Initial CCM visit with the clinical pharmacist In office.  PCP : Pincus Sanes, MD Patient Care Team: Pincus Sanes, MD as PCP - General (Internal Medicine) Lunette Stands, MD as Consulting Physician (Orthopedic Surgery) Rollene Rotunda, MD as Consulting Physician (Cardiology) Venancio Poisson, MD as Consulting Physician (Dermatology) Benjiman Core, MD (Hematology and Oncology) Cleda Mccreedy, MD (Gynecologic Oncology) Carlus Pavlov, MD (Endocrinology) Pyrtle, Carie Caddy, MD as Consulting Physician (Gastroenterology) Kathyrn Sheriff, [facility] as Pharmacist (Pharmacist)  Their chronic conditions include: Hypertension, Hyperlipidemia, Coronary Artery Disease, GERD, Depression, Anxiety, Osteopenia, Osteoarthritis and Overactive Bladder, PVD, IBS, peripheral neuropathy, ITP, prediabetes  Patient lives alone, she lost her husband about 5 years ago. Her daughter lives in town and calls her daily. She is not able to do much outside the house due to back and heel pain.   Office Visits: 01/20/20 Dr Lawerance Bach OV: restart escitalopram 2.5 mg (previously this increased her BP, but BP is on low side so will monitor effects). Try Voltaren gel for heel pain.   10/02/19 Dr Lawerance Bach OV: referred to ortho and neurology for low back pain, inability to walk straight  Consult Visit: 12/28/19 Dr Cathey Endow (ophthalmology): f/u for glaucoma 12/25/19 ortho: f/u for tendinitis, Ankle pain/stiffness 09/28/19 Dr Antoine Poche (cardiology): f/u for CAD, HTN, PVD. Conditions stable. Opted not to treat palpitations as these are not symptomatic. No med changes.   Allergies  Allergen Reactions  . Aspirin Other (See Comments)    ITP  . Sulfa Antibiotics Other (See Comments)    dizziness  . Contrast Media [Iodinated Diagnostic Agents] Hives    CAT scan  contrast only  . Lexapro [Escitalopram]     Hypertension  . Fluoxetine     Did not feel well on it   . Celexa [Citalopram] Other (See Comments)    Felt out of it  . Statins Other (See Comments)    Medications: Outpatient Encounter Medications as of 03/02/2020  Medication Sig  . acetaminophen (TYLENOL) 500 MG tablet Take 500 mg by mouth every 6 (six) hours as needed.  . Biotin 2500 MCG CAPS Take 1 capsule by mouth daily.   . calcium carbonate (TUMS - DOSED IN MG ELEMENTAL CALCIUM) 500 MG chewable tablet Chew 1 tablet by mouth daily as needed for indigestion or heartburn.  . Calcium-Magnesium-Vitamin D (CITRACAL CALCIUM+D) 600-40-500 MG-MG-UNIT TB24 Take 1 tablet by mouth daily.   . cycloSPORINE (RESTASIS) 0.05 % ophthalmic emulsion 1 drop 2 (two) times daily.  . diclofenac Sodium (VOLTAREN) 1 % GEL Apply 2 g topically 4 (four) times daily.  . dorzolamide-timolol (COSOPT) 22.3-6.8 MG/ML ophthalmic solution Place 1 drop into both eyes 2 (two) times daily.  . famotidine (PEPCID) 20 MG tablet Take 1 tablet (20 mg total) by mouth 2 (two) times daily as needed for heartburn or indigestion.  . Flaxseed MISC Take 5 mLs by mouth daily. Take 1 tsp daily  . furosemide (LASIX) 20 MG tablet Take 0.5 tablets (10 mg total) by mouth as needed.  . Glucos-Chond-Hyal Ac-Ca Fructo (MOVE FREE JOINT HEALTH ADVANCE PO) Take by mouth.  . losartan (COZAAR) 25 MG tablet Take 1 tablet (25 mg total) by mouth daily.  . Magnesium Oxide 250 MG TABS Take by mouth.  . Multiple Vitamin (MULTIVITAMIN) tablet Take 1 tablet by mouth daily.    [name]  naproxen sodium (ALEVE) 220 MG tablet Take 220 mg by mouth as needed.   . polyethylene glycol (MIRALAX / GLYCOLAX) packet Take 17 g by mouth daily as needed for moderate constipation.   . predniSONE (DELTASONE) 2.5 MG tablet TAKE 1 TABLET BY MOUTH EVERY OTHER DAY  . Probiotic Product (PROBIOTIC DAILY PO) Take by mouth.  . psyllium (METAMUCIL) 58.6 % packet Take 1 packet by mouth  daily as needed.  . trolamine salicylate (ASPERCREME) 10 % cream Apply 1 application topically as needed for muscle pain.  [name] escitalopram (LEXAPRO) 5 MG tablet Take 0.5 tablets (2.5 mg total) by mouth daily. (Patient not taking: Reported on 03/02/2020)   No facility-administered encounter medications on file as of 03/02/2020.    Wt Readings from Last 3 Encounters:  01/20/20 145 lb (65.8 kg)  10/02/19 145 lb (65.8 kg)  09/28/19 145 lb 6.4 oz (66 kg)    Current Diagnosis/Assessment:  SDOH Interventions     Most Recent Value  SDOH Interventions  Financial Strain Interventions Intervention Not Indicated      Goals Addressed            This Visit's Progress   . Pharmacy Care Plan       CARE PLAN ENTRY (see longitudinal plan of care for additional care plan information)  Current Barriers:  . Chronic Disease Management support, education, and care coordination needs related to Hypertension, Hyperlipidemia, Coronary Artery Disease, and Depression/Anxiety   Hypertension BP Readings from Last 3 Encounters:  01/20/20 112/68  10/02/19 (!) 162/70  09/28/19 (!) 170/72 .  Pharmacist Clinical Goal(s): o Over the next 30 days, patient will work with PharmD and providers to maintain BP goal <130/80 . Current regimen:  o Losartan 25 mg daily o Furosemide 20 mg - 1/2 tab as needed . Interventions: o Discussed BP goals and benefits of medications for prevention of heart attack / stroke o Recommend to reduce furosemide to every other day to prevent low BP . Patient self care activities - Over the next 30 days, patient will: o Check BP daily, document, and provide at future appointments o Ensure daily salt intake < 2300 mg/day o Reduce furosemide to every other day and monitor swelling  Hyperlipidemia Lab Results  Component Value Date/Time   LDLCALC 107 (H) 05/23/2017 11:51 AM   LDLCALC 132 (H) 06/22/2014 08:25 AM   LDLDIRECT 155.9 03/19/2014 08:28 AM .  Pharmacist Clinical  Goal(s): o Over the next 90 days, patient will work with PharmD and providers to achieve LDL goal < 70 . Current regimen:  o No medications . Interventions: o Discussed cholesterol goals and benefits of medications for prevention of heart attack / stroke o Discussed benefits of ezetimibe; if LDL is elevated at next check may consider adding ezetimibe . Patient self care activities - Over the next 90 days, patient will: o Follow up with PCP as scheduled  Depression / Anxiety . Pharmacist Clinical Goal(s) o Over the next 30 days, patient will work with PharmD and providers to optimize therapy . Current regimen:  o No medications . Interventions: o Discussed possibility of restarting escitalopram 2.5 mg once BP has stabilized; advised it may take 4-8 weeks for maximum benefit o Discussed benefits of melatonin for improving sleep cycle/hygiene . Patient self care activities - Over the next 30 days, patient will: o Consider restarting escitalopram when Bp/dizziness stabilizes o Try melatonin 2 mg at bedtime  Medication management . Pharmacist Clinical Goal(s): o Over the next 90 days, patient  will work with PharmD and providers to achieve optimal medication adherence . Current pharmacy: [facility] . Interventions o Comprehensive medication review performed. o Utilize [facility] for medication synchronization, packaging and delivery . Patient self care activities - Over the next 90 days, patient will: o Focus on medication adherence by pill box o Take medications as prescribed o Report any questions or concerns to PharmD and/or provider(s)  Initial goal documentation       Hypertension   BP goal is:  <130/80  Office blood pressures are  BP Readings from Last 3 Encounters:  01/20/20 112/68  10/02/19 (!) 162/70  09/28/19 (!) 170/72   Lab Results  Component Value Date   NA 141 01/20/2020   K 4.6 01/20/2020   CO2 32 01/20/2020   BUN 23 01/20/2020   CREATININE 0.82  01/20/2020   CALCIUM 9.2 01/20/2020   GFR 59.20 (L) 11/19/2018   GFRNONAA 64 01/20/2020   GFRAA 74 01/20/2020   Patient checks BP at home twice daily Patient home BP readings are ranging: SBP 115-150 (lower during the day, higher around 11pm right before she takes losartan)  Patient has failed these meds in the past: n/a Patient is currently controlled on the following medications:  . Losartan 25 mg daily . Furosemide 20 mg - 1/2 tab daily  We discussed: benefits of medications; pt reports issues with low BP/ dizziness/lightheadedness but denies falls; pt reports her cardiologist has told her she does not need to take furosemide every day if she is not having swelling; she is currently taking furosemide daily and denies swelling; advised her to reduce furosemide to every other day and monitor swelling; also advised her to ensure adequate hydration with water/Pedialyte to prevent low BP  Plan  Recommend reduce furosemide 10 mg to every other day Monitor swelling  Hyperlipidemia    LDL goal < 70 Hx PVD - stent to SFA in 2011 Hx CAD - minor, nonobstructive per cath 2012 (40% RCA)  Last lipids Lab Results  Component Value Date   CHOL 196 07/04/2017   HDL 58.40 05/23/2017   LDLCALC 107 (H) 05/23/2017   LDLDIRECT 155.9 03/19/2014   TRIG 193 (H) 06/30/2017   CHOLHDL 3 05/23/2017   Hepatic Function Latest Ref Rng & Units 01/20/2020 11/19/2018 01/28/2018  Total Protein 6.1 - 8.1 g/dL 6.8 7.8 7.4  Albumin 3.5 - 5.2 g/dL - 4.6 4.2  AST 10 - 35 U/L 19 18 15   ALT 6 - 29 U/L 14 12 10   Alk Phosphatase 39 - 117 U/L - 77 73  Total Bilirubin 0.2 - 1.2 mg/dL 0.3 0.4 0.3  Bilirubin, Direct 0.0 - 0.3 mg/dL - - -     The ASCVD Risk score Denman George DC Jr., et al., 2013) failed to calculate for the following reasons:   The 2013 ASCVD risk score is only valid for ages 73 to 40   Patient has failed these meds in past: pravastatin Patient is currently uncontrolled on the following medications:   . No medication  We discussed:  diet and exercise extensively; pt typically eats 1-2 meals per day; she is unable to exercise due to back/foot pain; pt had myalgias with pravastatin and is not interested in trying a different statin; ezetimibe may be an option, however pt needs a new lipid panel to aid in decision making. Advised her to f/u with PCP as scheduled  Plan  Continue control with diet and exercise  May consider ezetimibe if LDL is above goal at next  check  Depression   Depression screen [facility] 2/9 02/24/2019 12/11/2017 12/11/2016  Decreased Interest 0 2 1  Down, Depressed, Hopeless 1 3 0  PHQ - 2 Score 1 5 1   Altered sleeping 1 0 0  Tired, decreased energy 1 2 1   Change in appetite 0 1 0  Feeling bad or failure about yourself  0 3 0  Trouble concentrating 0 1 1  Moving slowly or fidgety/restless 0 0 0  Suicidal thoughts 0 1 0  PHQ-9 Score 3 13 3   Difficult doing work/chores Not difficult at all Not difficult at all Not difficult at all  Some recent data might be hidden   Patient has failed these meds in past: alprazolam, fluoxetine, citalopram,  Patient is currently uncontrolled on the following medications:  . Escitalopram 5 mg - 1/2 tab daily (not taking)  We discussed:  Pt stopped escitalopram after 2 days due to feeling "off"; she reports mood is unchanged and does report feeling down, depressed or anxious often. Pt is worried escitalopram will cause dizziness; pt to reduce furosemide over the next month, see if dizziness improves; may consider retrying escitalopram if able; discussed 4-8 week trial for maximum benefit.  Discussed benefits of sleep hygiene, melatonin for sleep. Pt typically sleeps 8 hours but she goes to bed at 1:30-2am and wakes up at 10-11am, and she would like to change this pattern.  Plan  Continue to monitor symptoms; consider re-trial of escitalopram if dizziness improves with reduced frequency of furosemide Recommend melatonin 2 mg HS  GERD    Patient has failed these meds in past: n/a Patient is currently controlled on the following medications:  . Famotidine 20 mg BID prn . Tums PRN  We discussed:  Patient is satisfied with current regimen and denies issues  Plan  Continue current medications  IBS   Patient has failed these meds in past: n/a Patient is currently controlled on the following medications:  [name] Miralax PRN . Probiotic . Metamucil PRN . Magnesium oxide 250 mg  We discussed:  Patient is satisfied with current regimen and denies issues  Plan  Continue current medications  Osteoarthritis / Back pain   Patient has failed these meds in past: n/a Patient is currently controlled on the following medications:  [name] Glucosamine-chondroitin ([employer]) . Tylenol 500 mg q6h PRN . Naproxen 220 mg PRN . Voltaren gel BID (heel pain) . Aspercreme PRN  We discussed:  Pt reports movement/walking makes back pain worse; she can typically control pain with Tylenol but is worried about the effect on the liver; discussed maximum dose of tylenol is 3000 mg/day; pt reports Voltaren has been helpful for heel pain, she believes pain may be related to her sneakers; advised her to f/u with podiatrist  Plan  Continue current medications  Recommend f/u with podiatrist for heel pain  Osteopenia   Last DEXA Scan: 04/07/2019   T-Score femoral neck: -2.1 (up 2.7%)  T-Score total hip: n/a  T-Score lumbar spine: +0.4 (up 0.1%)  T-Score forearm radius: n/a  10-year probability of major osteoporotic fracture: 14.7%  10-year probability of hip fracture: 4.9%  Last vitamin D Lab Results  Component Value Date   VD25OH 44 07/08/2014   Patient is a candidate for pharmacologic treatment due to T-Score -1.0 to -2.5 and 10-year risk of hip fracture > 3%  Pt has declined treatment previously.  Patient has failed these meds in past: n/a Patient is currently controlled on the following medications:   .  Calcium-Mg-Vitamin D  We discussed:  Recommend [phone] units of vitamin D daily. Recommend 1200 mg of calcium daily from dietary and supplemental sources.  Plan  Continue current medications  Immune thrombocytopenic purpura (ITP)   Patient has failed these meds in past: n/a Patient is currently controlled on the following medications:  . Prednisone 2.5 mg QOD  We discussed:  Discussed low dose prednisone is unlikely to cause significant side effects; may be contributing slightly to high BP or swelling  Plan  Continue current medications  Glaucoma   Patient has failed these meds in past: n/a Patient is currently controlled on the following medications:  [name] Dorzolamide-Timolol eye drops . Restasis eye drops  We discussed:  Patient is satisfied with current regimen and denies issues; Restasis is the only medication that is expensive ~$50/3 months  Plan  Continue current medications  Health Maintenance   Patient is currently controlled on the following medications:  . Biotin 2500 mcg daily . Flaxseed . Multivitamin  We discussed:  Patient is satisfied with current regimen and denies issues  Plan  Continue current medications  Medication Management   Pt uses [facility] for all medications Uses pill box? Yes Pt endorses 100% compliance  We discussed: Verbal consent obtained for [facility] enhanced pharmacy services (medication synchronization, adherence packaging, delivery coordination). A medication sync plan was created to allow patient to get all medications delivered once every 30 to 90 days per patient preference. Patient understands they have freedom to choose pharmacy and clinical pharmacist will coordinate care between all prescribers and [facility].   Plan  Utilize [facility] for medication synchronization, packaging and delivery   Follow up: 1 month phone visit  Al Corpus, PharmD, [facility] Clinical  Pharmacist [facility] [phone]

## 2020-03-03 ENCOUNTER — Ambulatory Visit (HOSPITAL_COMMUNITY): Payer: Medicare HMO

## 2020-03-03 NOTE — Patient Instructions (Addendum)
Visit Information  Phone number for Pharmacist: [phone]  Thank you for meeting with me to discuss your medications! I look forward to working with you to achieve your health care goals. Below is a summary of what we talked about during the visit:  Goals Addressed            This Visit's Progress   . Pharmacy Care Plan       CARE PLAN ENTRY (see longitudinal plan of care for additional care plan information)  Current Barriers:  . Chronic Disease Management support, education, and care coordination needs related to Hypertension, Hyperlipidemia, Coronary Artery Disease, and Depression/Anxiety   Hypertension BP Readings from Last 3 Encounters:  01/20/20 112/68  10/02/19 (!) 162/70  09/28/19 (!) 170/72 .  Pharmacist Clinical Goal(s): o Over the next 30 days, patient will work with PharmD and providers to maintain BP goal <130/80 . Current regimen:  o Losartan 25 mg daily o Furosemide 20 mg - 1/2 tab as needed . Interventions: o Discussed BP goals and benefits of medications for prevention of heart attack / stroke o Recommend to reduce furosemide to every other day to prevent low BP . Patient self care activities - Over the next 30 days, patient will: o Check BP daily, document, and provide at future appointments o Ensure daily salt intake < 2300 mg/day o Reduce furosemide to every other day and monitor swelling  Hyperlipidemia Lab Results  Component Value Date/Time   LDLCALC 107 (H) 05/23/2017 11:51 AM   LDLCALC 132 (H) 06/22/2014 08:25 AM   LDLDIRECT 155.9 03/19/2014 08:28 AM .  Pharmacist Clinical Goal(s): o Over the next 90 days, patient will work with PharmD and providers to achieve LDL goal < 70 . Current regimen:  o No medications . Interventions: o Discussed cholesterol goals and benefits of medications for prevention of heart attack / stroke o Discussed benefits of ezetimibe; if LDL is elevated at next check may consider adding ezetimibe . Patient self care  activities - Over the next 90 days, patient will: o Follow up with PCP as scheduled  Depression / Anxiety . Pharmacist Clinical Goal(s) o Over the next 30 days, patient will work with PharmD and providers to optimize therapy . Current regimen:  o No medications . Interventions: o Discussed possibility of restarting escitalopram 2.5 mg once BP has stabilized; advised it may take 4-8 weeks for maximum benefit o Discussed benefits of melatonin for improving sleep cycle/hygiene . Patient self care activities - Over the next 30 days, patient will: o Consider restarting escitalopram when Bp/dizziness stabilizes o Try melatonin 2 mg at bedtime  Medication management . Pharmacist Clinical Goal(s): o Over the next 90 days, patient will work with PharmD and providers to achieve optimal medication adherence . Current pharmacy: [facility] . Interventions o Comprehensive medication review performed. o Utilize [facility] pharmacy for medication synchronization, packaging and delivery . Patient self care activities - Over the next 90 days, patient will: o Focus on medication adherence by pill box o Take medications as prescribed o Report any questions or concerns to PharmD and/or provider(s)  Initial goal documentation      Ms. Sossamon was given information about Chronic Care Management services today including:  1. CCM service includes personalized support from designated clinical staff supervised by her physician, including individualized plan of care and coordination with other care providers 2. 24/7 contact phone numbers for assistance for urgent and routine care needs. 3. Standard insurance, coinsurance, copays and deductibles apply for chronic care  management only during months in which we provide at least 20 minutes of these services. Most insurances cover these services at 100%, however patients may be responsible for any copay, coinsurance and/or deductible if applicable. This service may help  you avoid the need for more expensive face-to-face services. 4. Only one practitioner may furnish and bill the service in a calendar month. 5. The patient may stop CCM services at any time (effective at the end of the month) by phone call to the office staff.  Patient agreed to services and verbal consent obtained.   Print copy of patient instructions provided.  Telephone follow up appointment with pharmacy team member scheduled for: 1 month  Charlene Brooke, PharmD, BCACP Clinical Pharmacist [facility] [phone]  Quality Sleep Information, Adult Quality sleep is important for your mental and physical health. It also improves your quality of life. Quality sleep means you:  Are asleep for most of the time you are in bed.  Fall asleep within 30 minutes.  Wake up no more than once a night.  Are awake for no longer than 20 minutes if you do wake up during the night. Most adults need 7-8 hours of quality sleep each night. How can poor sleep affect me? If you do not get enough quality sleep, you may have:  Mood swings.  Daytime sleepiness.  Confusion.  Decreased reaction time.  Sleep disorders, such as insomnia and sleep apnea.  Difficulty with: ? Solving problems. ? Coping with stress. ? Paying attention. These issues may affect your performance and productivity at work, school, and at home. Lack of sleep may also put you at higher risk for accidents, suicide, and risky behaviors. If you do not get quality sleep you may also be at higher risk for several health problems, including:  Infections.  Type 2 diabetes.  Heart disease.  High blood pressure.  Obesity.  Worsening of long-term conditions, like arthritis, kidney disease, depression, Parkinson's disease, and epilepsy. What actions can I take to get more quality sleep?      Stick to a sleep schedule. Go to sleep and wake up at about the same time each day. Do not try to sleep  less on weekdays and make up for lost sleep on weekends. This does not work.  Try to get about 30 minutes of exercise on most days. Do not exercise 2-3 hours before going to bed.  Limit naps during the day to 30 minutes or less.  Do not use any products that contain nicotine or tobacco, such as cigarettes or e-cigarettes. If you need help quitting, ask your health care provider.  Do not drink caffeinated beverages for at least 8 hours before going to bed. Coffee, tea, and some sodas contain caffeine.  Do not drink alcohol close to bedtime.  Do not eat large meals close to bedtime.  Do not take naps in the late afternoon.  Try to get at least 30 minutes of sunlight every day. Morning sunlight is best.  Make time to relax before bed. Reading, listening to music, or taking a hot bath promotes quality sleep.  Make your bedroom a place that promotes quality sleep. Keep your bedroom dark, quiet, and at a comfortable room temperature. Make sure your bed is comfortable. Take out sleep distractions like TV, a computer, smartphone, and bright lights.  If you are lying awake in bed for longer than 20 minutes, get up and do a relaxing activity until you feel sleepy.  Work  with your health care provider to treat medical conditions that may affect sleeping, such as: ? Nasal obstruction. ? Snoring. ? Sleep apnea and other sleep disorders.  Talk to your health care provider if you think any of your prescription medicines may cause you to have difficulty falling or staying asleep.  If you have sleep problems, talk with a sleep consultant. If you think you have a sleep disorder, talk with your health care provider about getting evaluated by a specialist. Where to find more information  [place] website: https://sleepfoundation.org  National Heart, Lung, and [place] http://www.saunders.info/.pdf  Centers for Disease Control and  Prevention (CDC): LearningDermatology.pl Contact a health care provider if you:  Have trouble getting to sleep or staying asleep.  Often wake up very early in the morning and cannot get back to sleep.  Have daytime sleepiness.  Have daytime sleep attacks of suddenly falling asleep and sudden muscle weakness (narcolepsy).  Have a tingling sensation in your legs with a strong urge to move your legs (restless legs syndrome).  Stop breathing briefly during sleep (sleep apnea).  Think you have a sleep disorder or are taking a medicine that is affecting your quality of sleep. Summary  Most adults need 7-8 hours of quality sleep each night.  Getting enough quality sleep is an important part of health and well-being.  Make your bedroom a place that promotes quality sleep and avoid things that may cause you to have poor sleep, such as alcohol, caffeine, smoking, and large meals.  Talk to your health care provider if you have trouble falling asleep or staying asleep. This information is not intended to replace advice given to you by your health care provider. Make sure you discuss any questions you have with your health care provider. Document Revised: 07/31/2017 Document Reviewed: 07/31/2017 Elsevier Patient Education  [place].

## 2020-03-04 ENCOUNTER — Encounter: Payer: Self-pay | Admitting: Internal Medicine

## 2020-03-04 ENCOUNTER — Ambulatory Visit (HOSPITAL_COMMUNITY)
Admission: RE | Admit: 2020-03-04 | Discharge: 2020-03-04 | Disposition: A | Discharge status: Home / Self Care | Payer: Medicare HMO | Source: Ambulatory Visit | Attending: Gynecologic Oncology | Admitting: Gynecologic Oncology

## 2020-03-04 ENCOUNTER — Telehealth: Payer: Self-pay | Admitting: Internal Medicine

## 2020-03-04 ENCOUNTER — Other Ambulatory Visit: Payer: Self-pay

## 2020-03-04 DIAGNOSIS — N949 Unspecified condition associated with female genital organs and menstrual cycle: Secondary | ICD-10-CM | POA: Insufficient documentation

## 2020-03-04 DIAGNOSIS — N838 Other noninflammatory disorders of ovary, fallopian tube and broad ligament: Secondary | ICD-10-CM | POA: Diagnosis not present

## 2020-03-04 DIAGNOSIS — N281 Cyst of kidney, acquired: Secondary | ICD-10-CM | POA: Diagnosis not present

## 2020-03-04 DIAGNOSIS — N8110 Cystocele, unspecified: Secondary | ICD-10-CM | POA: Diagnosis not present

## 2020-03-04 DIAGNOSIS — N83202 Unspecified ovarian cyst, left side: Secondary | ICD-10-CM | POA: Diagnosis not present

## 2020-03-04 NOTE — Telephone Encounter (Signed)
Patient calling because she is having very bad pain from her achilles tendonitis with a spur in her heel, its inflammed and wants to know if there is any thing she can take to help.

## 2020-03-04 NOTE — Telephone Encounter (Signed)
Message sent via my chart

## 2020-03-07 ENCOUNTER — Telehealth: Payer: Self-pay

## 2020-03-07 NOTE — Telephone Encounter (Signed)
TC to patient to review MRI results.  No answer, left message to return call.

## 2020-03-08 ENCOUNTER — Other Ambulatory Visit: Payer: Self-pay

## 2020-03-08 ENCOUNTER — Encounter: Payer: Self-pay | Admitting: Obstetrics & Gynecology

## 2020-03-08 ENCOUNTER — Ambulatory Visit (INDEPENDENT_AMBULATORY_CARE_PROVIDER_SITE_OTHER): Payer: Medicare HMO | Admitting: Obstetrics & Gynecology

## 2020-03-08 ENCOUNTER — Telehealth: Payer: Self-pay

## 2020-03-08 VITALS — BP 134/70 | Ht 61.0 in | Wt 147.0 lb

## 2020-03-08 DIAGNOSIS — Z01419 Encounter for gynecological examination (general) (routine) without abnormal findings: Secondary | ICD-10-CM

## 2020-03-08 DIAGNOSIS — N8110 Cystocele, unspecified: Secondary | ICD-10-CM

## 2020-03-08 DIAGNOSIS — N83202 Unspecified ovarian cyst, left side: Secondary | ICD-10-CM

## 2020-03-08 DIAGNOSIS — Z780 Asymptomatic menopausal state: Secondary | ICD-10-CM

## 2020-03-08 NOTE — Progress Notes (Signed)
Tamara Robertson [birth year] [id number]   History:    86 y.o. G3P3L2  RP:  Established patient presenting for annual gyn exam   HPI: Longtime postmenopausal, well on no hormone replacement therapy.  Status post total hysterectomy.  No pelvic pain.  Followed for a left ovarian cyst which showed stability and benign appearance at 4.2 cm on MRI 03/05/2020.  Abstinent.  Urine and bowel movements normal.  Breasts normal.  Screening Mammo Negative 11/2019. Body mass index 27.78.  Physically active every day.  Health labs with family physician.  BD Osteopenia 04/2019.  Past medical history,surgical history, family history and social history were all reviewed and documented in the EPIC chart.  Gynecologic History No LMP recorded. Patient has had a hysterectomy.  Obstetric History OB History  Gravida Para Term Preterm AB Living  3 3       2   SAB TAB Ectopic Multiple Live Births               # Outcome Date GA Lbr Len/2nd Weight Sex Delivery Anes PTL Lv  3 Para           2 Para           1 Para             Obstetric Comments  1 child passed away at 20     ROS: A ROS was performed and pertinent positives and negatives are included in the history.  GENERAL: No fevers or chills. HEENT: No change in vision, no earache, sore throat or sinus congestion. NECK: No pain or stiffness. CARDIOVASCULAR: No chest pain or pressure. No palpitations. PULMONARY: No shortness of breath, cough or wheeze. GASTROINTESTINAL: No abdominal pain, nausea, vomiting or diarrhea, melena or bright red blood per rectum. GENITOURINARY: No urinary frequency, urgency, hesitancy or dysuria. MUSCULOSKELETAL: No joint or muscle pain, no back pain, no recent trauma. DERMATOLOGIC: No rash, no itching, no lesions. ENDOCRINE: No polyuria, polydipsia, no heat or cold intolerance. No recent change in weight. HEMATOLOGICAL: No anemia or easy bruising or bleeding. NEUROLOGIC: No headache, seizures, numbness, tingling or weakness.  PSYCHIATRIC: No depression, no loss of interest in normal activity or change in sleep pattern.     Exam:   BP 134/70   Ht 5\' 1"  (1.549 m)   Wt 147 lb (66.7 kg)   BMI 27.78 kg/m   Body mass index is 27.78 kg/m.  General appearance : Well developed well nourished female. No acute distress HEENT: Eyes: no retinal hemorrhage or exudates,  Neck supple, trachea midline, no carotid bruits, no thyroidmegaly Lungs: Clear to auscultation, no rhonchi or wheezes, or rib retractions  Heart: Regular rate and rhythm, no murmurs or gallops Breast:Examined in sitting and supine position were symmetrical in appearance, no palpable masses or tenderness,  no skin retraction, no nipple inversion, no nipple discharge, no skin discoloration, no axillary or supraclavicular lymphadenopathy Abdomen: no palpable masses or tenderness, no rebound or guarding Extremities: no edema or skin discoloration or tenderness  Pelvic: Vulva: Normal             Vagina: No gross lesions or discharge.  Cystocele grade 2/4.  Cervix/Uterus absent  Adnexa  Without masses or tenderness  Anus: Normal   Assessment/Plan:  87 y.o. female for annual exam   1. Well female exam with routine gynecological exam Gynecologic exam status post total hysterectomy with cystocele grade 2/4.  No indication for Pap test this year.  Breast exam normal.  Screening mammogram  July 2021 was negative.  Health labs with family physician.  Good body mass index at 27.78.  Continue with physical activities and healthy nutrition.  2. Postmenopausal Well on no hormone replacement therapy.  Vitamin D supplements, calcium intake of 1200 to 1500 mg daily and regular weightbearing physical activities.  Last bone density December 2020 showed osteopenia.  3. Baden-Walker grade 2 cystocele Grade 2/4 cystocele.  Avoid pelvic pressure.  Pelvic floor reinforcement reviewed.  Kegel exercises recommended.  4. Left ovarian cyst Benign appearance and stable on MRI  March 05, 2020.  [name] MD, 2:21 PM 03/08/2020

## 2020-03-08 NOTE — Telephone Encounter (Signed)
Told Ms Occhipinti that the cyst is stable and benign appearing per Joylene John, NP. Follow up with Dr. Berline Lopes as planned on 03-22-20.

## 2020-03-11 ENCOUNTER — Ambulatory Visit: Payer: Medicare HMO | Admitting: Neurology

## 2020-03-12 ENCOUNTER — Encounter: Payer: Self-pay | Admitting: Obstetrics & Gynecology

## 2020-03-15 ENCOUNTER — Ambulatory Visit: Payer: Medicare HMO | Admitting: Podiatry

## 2020-03-15 ENCOUNTER — Other Ambulatory Visit: Payer: Self-pay

## 2020-03-15 ENCOUNTER — Encounter: Payer: Self-pay | Admitting: Podiatry

## 2020-03-15 DIAGNOSIS — I739 Peripheral vascular disease, unspecified: Secondary | ICD-10-CM | POA: Diagnosis not present

## 2020-03-15 DIAGNOSIS — M79674 Pain in right toe(s): Secondary | ICD-10-CM | POA: Diagnosis not present

## 2020-03-15 DIAGNOSIS — M79675 Pain in left toe(s): Secondary | ICD-10-CM | POA: Diagnosis not present

## 2020-03-15 DIAGNOSIS — L84 Corns and callosities: Secondary | ICD-10-CM

## 2020-03-15 DIAGNOSIS — B351 Tinea unguium: Secondary | ICD-10-CM

## 2020-03-18 ENCOUNTER — Telehealth: Payer: Self-pay | Admitting: *Deleted

## 2020-03-18 DIAGNOSIS — M79671 Pain in right foot: Secondary | ICD-10-CM | POA: Diagnosis not present

## 2020-03-18 NOTE — Telephone Encounter (Signed)
Called the patient and left a message to call the office back. Need to see if the patient wants to keep her appt with Dr Berline Lopes since she just saw Dr Pola Corn

## 2020-03-19 ENCOUNTER — Other Ambulatory Visit: Payer: Self-pay | Admitting: Internal Medicine

## 2020-03-19 NOTE — Progress Notes (Signed)
Subjective: Tamara Robertson presents today for follow up of for at risk foot care. Patient has h/o PAD with claudication. She has h/o placement of SFA stent.  She is see for painful mycotic nails b/l that are difficult to trim. Pain interferes with ambulation. Aggravating factors include wearing enclosed shoe gear. Pain is relieved with periodic professional debridement.   She states she did go to [facility] for physical therapy for her Achilles tendonitis of right LE. She feels the physical therapy did not provide much improvement. Her pain scale is 7-8/10. Patient states she will schedule an appointment with Orthopedics in the near future.  Allergies  Allergen Reactions  . Aspirin Other (See Comments)    ITP  . Sulfa Antibiotics Other (See Comments)    dizziness  . Contrast Media [Iodinated Diagnostic Agents] Hives    CAT scan contrast only  . Lexapro [Escitalopram]     Hypertension  . Fluoxetine     Did not feel well on it   . Celexa [Citalopram] Other (See Comments)    Felt out of it  . Statins Other (See Comments)     Objective: There were no vitals filed for this visit.  Pt 86 y.o. year old Caucasian female  in NAD. AAO x 3.   Vascular Examination:  Capillary refill time to digits immediate b/l. Faintly palpable DP pulses b/l. Faintly palpable PT pulses b/l. Pedal hair present b/l. Skin temperature gradient within normal limits b/l.  Dermatological Examination: Pedal skin with normal turgor, texture and tone bilaterally. No open wounds bilaterally. No interdigital macerations bilaterally. Toenails 1-5 b/l elongated, dystrophic, thickened, crumbly with subungual debris and tenderness to dorsal palpation. Hyperkeratotic lesion(s) R hallux and R 2nd toe.  No erythema, no edema, no drainage, no flocculence.  Musculoskeletal: Normal muscle strength 5/5 to all lower extremity muscle groups bilaterally, no pain crepitus or joint limitation noted with ROM b/l, bunion  deformity noted b/l and hammertoes noted to the  R 2nd toe.  She has pain on palpation at insertion of Achilles tendon RLE. +Equinus RLE. +Palpable calcaneal spur RLE. No edema, no warmth, no effusion. No palpable defect along Achilles tendon cord RLE.  Neurological: Protective sensation intact 5/5 intact bilaterally with 10g monofilament b/l Vibratory sensation intact b/l.  Assessment: 1. Pain due to onychomycosis of toenails of both feet   2. Corns and callosities   3. Peripheral vascular disease with claudication (HCC)    Plan: -Toenails 1-5 b/l were debrided in length and girth with sterile nail nippers and dremel without iatrogenic bleeding.  -Corn(s) R 2nd toe and callus(es) R hallux were pared utilizing sterile scalpel blade without incident. Total number debrided =2. -Regarding Achilles tendonitis, she did not get much improvement from course of physical therapy. I did offer her an appointment to see one of our Podiatrists for further evaluation.  She states she will schedule an appointment with [name] at her convenience.  -I did dispense heel lift for her right shoe on today's visit to hopefully relieve some of her pain.  -Patient to report any pedal injuries to medical professional immediately. -Patient to continue soft, supportive shoe gear daily. -Patient/POA to call should there be question/concern in the interim.  Return in about 3 months (around 06/15/2020) for toenail debridement w/corn(s)/callus(es).

## 2020-03-22 ENCOUNTER — Inpatient Hospital Stay: Payer: Medicare HMO | Admitting: Gynecologic Oncology
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
## 2020-03-23 ENCOUNTER — Emergency Department (HOSPITAL_COMMUNITY)
Admission: EM | Admit: 2020-03-23 | Discharge: 2020-03-23 | Disposition: A | Discharge status: Home / Self Care | Payer: Medicare HMO | Attending: Emergency Medicine | Admitting: Emergency Medicine

## 2020-03-23 ENCOUNTER — Telehealth: Payer: Self-pay | Admitting: Pharmacist

## 2020-03-23 DIAGNOSIS — Z79899 Other long term (current) drug therapy: Secondary | ICD-10-CM | POA: Diagnosis not present

## 2020-03-23 DIAGNOSIS — R42 Dizziness and giddiness: Secondary | ICD-10-CM

## 2020-03-23 DIAGNOSIS — I10 Essential (primary) hypertension: Secondary | ICD-10-CM | POA: Diagnosis not present

## 2020-03-23 DIAGNOSIS — R002 Palpitations: Secondary | ICD-10-CM | POA: Diagnosis present

## 2020-03-23 DIAGNOSIS — I2510 Atherosclerotic heart disease of native coronary artery without angina pectoris: Secondary | ICD-10-CM | POA: Insufficient documentation

## 2020-03-23 LAB — BASIC METABOLIC PANEL
Anion gap: 10 (ref 5–15)
BUN: 22 mg/dL (ref 8–23)
CO2: 29 mmol/L (ref 22–32)
Calcium: 9.7 mg/dL (ref 8.9–10.3)
Chloride: 103 mmol/L (ref 98–111)
Creatinine, Ser: 0.87 mg/dL (ref 0.44–1.00)
GFR, Estimated: >60 mL/min (ref >60)
Glucose, Bld: 90 mg/dL (ref 70–99)
Potassium: 4 mmol/L (ref 3.5–5.1)
Sodium: 142 mmol/L (ref 135–145)

## 2020-03-23 LAB — CBC
HCT: 43.5 % (ref 36.0–46.0)
Hemoglobin: 14.1 g/dL (ref 12.0–15.0)
MCH: 28.7 pg (ref 26.0–34.0)
MCHC: 32.4 g/dL (ref 30.0–36.0)
MCV: 88.4 fL (ref 80.0–100.0)
Platelets: 227 10*3/uL (ref 150–400)
RBC: 4.92 MIL/uL (ref 3.87–5.11)
RDW: 13.8 % (ref 11.5–15.5)
WBC: 13.7 10*3/uL — ABNORMAL HIGH (ref 4.0–10.5)
nRBC: 0 % (ref 0.0–0.2)

## 2020-03-23 MED ORDER — LOSARTAN POTASSIUM 50 MG PO TABS
25.0000 mg | ORAL_TABLET | Freq: Once | ORAL | Status: AC
  Administered 2020-03-23: 25 mg via ORAL
  Filled 2020-03-23: qty 1

## 2020-03-23 NOTE — ED Triage Notes (Addendum)
Pt arrives via EMS from home. C/o dizziness since yesterday afternoon. Home automatic cuff read HTN. Dizziness worse throughout the night, causing her to call 911. Pt ambulatory in home, denied dizziness, N/V with EMS present.   Hx of HTN; antihypertensives taken 3 hours ago  EMS: 230/100 Manual ; 200/90 automatic HR 80 98% RA CBG 109 96.4 tympanic temp  20G IV L. AC

## 2020-03-23 NOTE — Progress Notes (Signed)
Received call from patient to discuss recent ED visit for hypertension.   Pt was taking prednisone pack for pain, after 3 days her BP was very high so she went to ED, she was advised to stop the prednisone burst (and continue her chronic low-dose prednisone).  Patient is asking if prednisone was truly the cause of her high BP. Discussed the high dose prednisone does cause high blood pressure and heart rate. Her chronic dose (2.5 mg QOD) is not high enough to cause these issues.  Pt is worried about prednisone withdrawal because she is feeling fatigued and like she "got hit by a truck". Discussed prednisone withdrawal typically occurs after taking high doses for several weeks, it is unlikely she is having withdrawal after just a few days.   Advised she resume her chronic low-dose prednisone and see PCP if symptoms do not improve or get worse.

## 2020-03-23 NOTE — ED Notes (Addendum)
Pt denies dizziness while ambulating in hall. Ambulated self with this RN present in case of needed support. Also denies SOB & nausea.

## 2020-03-23 NOTE — ED Provider Notes (Signed)
[facility] EMERGENCY DEPARTMENT Provider Note   CSN: [id number] Arrival date & time: 03/23/20  0252     History Chief Complaint - dizziness, hypertension  Tamara Robertson is a 86 y.o. female.  The history is provided by the patient.  Hypertension This is a new problem. The problem occurs constantly. The problem has not changed since onset.Pertinent negatives include no chest pain, no abdominal pain, no headaches and no shortness of breath. Nothing aggravates the symptoms. Nothing relieves the symptoms. She has tried nothing for the symptoms.  Patient with history of CAD, hypertension, hyperlipidemia presents with hypertension and dizziness.  Patient reports that in the afternoon she noted mild lightheadedness, and felt flushed.  She checked her blood pressure and it was elevated up to systolic of 630 She reports it has been a while since she checked her blood pressure but this was the highest has been.  She reported mild palpitations, but no chest pain or shortness of breath.  No headache or weakness She does report recently started prednisone about 4 days ago for arthralgias     Past Medical History:  Diagnosis Date  . Anxiety   . CAD (coronary artery disease)    RCA 40% stenosis cath 01/2011  . Cholelithiasis    s/p lap chole 09/2014  . Diverticular stricture [facility]) 2006   [facility]  . DIVERTICULITIS, HX OF   . Diverticulosis   . DYSLIPIDEMIA   . Elevated LFTs   . GERD   . Glaucoma   . Hepatic steatosis   . HOH (hard of hearing)   . Immune thrombocytopenic purpura (HCC)    chronic - baseline 80-100K, on pred  . Irritable bowel syndrome   . Left ovarian cyst dx 01/2013 CT   working with gyn, ?malignant - elevated tumor marker OVA1  . OSTEOARTHRITIS, KNEE, RIGHT   . OSTEOPENIA   . OVERACTIVE BLADDER   . Prolapse of female bladder, acquired 11/2017  . UNSPECIFIED PERIPHERAL VASCULAR DISEASE   . URINARY INCONTINENCE      Patient Active Problem List   Diagnosis Date Noted  . Educated about COVID-19 virus infection 08/13/2019  . Posterior neck pain 08/03/2019  . Dizziness 08/03/2019  . Other headache syndrome 08/03/2019  . Diarrhea 03/13/2019  . Nonrheumatic aortic valve stenosis 02/26/2019  . Sensorineural hearing loss (SNHL), bilateral 01/30/2019  . Chronic ITP (idiopathic thrombocytopenia) (HCC) 12/01/2018  . Lightheadedness 11/19/2018  . Epistaxis 11/19/2018  . 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 Rash 03/25/2018  . Chronic left-sided headaches 01/28/2018  . Depression 12/18/2017  . SBO (small bowel obstruction) ([place]) 09/20/2017  . Aortic stenosis, moderate 08/22/2017  . CAD (coronary artery disease) 08/21/2017  . Pleural effusion on right   . Streptococcal pneumonia ([place]) 06/26/2017  . Community acquired pneumonia of right lower lobe of lung 06/25/2017  . Current chronic use of systemic steroids 06/25/2017  . Chronic lower back pain 05/14/2017  . Varicose veins of left lower extremity with complications 16/05/930  . Bilateral hearing loss 02/01/2017  . Hypertension 07/31/2016  . Leg edema 07/31/2016  . Prediabetes 06/05/2016  . Osteopenia 12/01/2015  . Anxiety and depression 07/19/2015  . Degenerative cervical disc 12/28/2014  . Left ovarian cyst   . Peripheral neuropathy 06/03/2012  . PVD (peripheral vascular disease) ([place]) 11/23/2010  . Bursitis of hip 10/26/2010  . Hyperlipidemia 06/27/2010  . IMMUNE THROMBOCYTOPENIC PURPURA 06/27/2010  . Unspecified glaucoma 06/27/2010  . GERD 06/27/2010  . Irritable bowel syndrome 06/27/2010  .  OVERACTIVE BLADDER 06/27/2010  . OSTEOARTHRITIS, KNEE, RIGHT 06/27/2010  . URINARY INCONTINENCE 06/27/2010    Past Surgical History:  Procedure Laterality Date  . ABDOMINAL HYSTERECTOMY  1963  . ANGIOPLASTY    . APPENDECTOMY  1956  . CARDIAC CATHETERIZATION    . CATARACT EXTRACTION, BILATERAL  10/2010  . CHOLECYSTECTOMY N/A 09/16/2014    Procedure: LAPAROSCOPIC CHOLECYSTECTOMY ;  Surgeon: Rolm Bookbinder, MD;  Location: [place];  Service: General;  Laterality: N/A;  . KNEE ARTHROSCOPY Right   . L pop PTA  10/2009   stent  . LAPAROSCOPIC SIGMOID COLECTOMY  10/2005  . SPLENECTOMY  1954  . VARICOSE VEIN SURGERY Right 1962     OB History    Gravida  3   Para  3   Term      Preterm      AB      Living  2     SAB      TAB      Ectopic      Multiple      Live Births           Obstetric Comments  1 child passed away at 63        Family History  Problem Relation Age of Onset  . Coronary artery disease Mother   . Heart attack Mother 60  . Hyperlipidemia Mother   . Hypertension Mother   . Stomach cancer Father   . Hypertension Daughter   . Hyperlipidemia Daughter   . Arthritis Other        parent  . Transient ischemic attack Other        parent  . Colon cancer Neg Hx     Social History   Tobacco Use  . Smoking status: Never Smoker  . Smokeless tobacco: Never Used  Vaping Use  . Vaping Use: Never used  Substance Use Topics  . Alcohol use: No    Alcohol/week: 0.0 standard drinks    Comment: rarely  . Drug use: No    Home Medications Prior to Admission medications   Medication Sig Start Date End Date Taking? Authorizing Provider  acetaminophen (TYLENOL) 500 MG tablet Take 500 mg by mouth every 6 (six) hours as needed.    [provider]  Biotin 2500 MCG CAPS Take 1 capsule by mouth daily.     [provider]  calcium carbonate (TUMS - DOSED IN MG ELEMENTAL CALCIUM) 500 MG chewable tablet Chew 1 tablet by mouth daily as needed for indigestion or heartburn.    [provider]  Calcium-Magnesium-Vitamin D (CITRACAL CALCIUM+D) 600-40-500 MG-MG-UNIT TB24 Take 1 tablet by mouth daily.     [provider]  cycloSPORINE (RESTASIS) 0.05 % ophthalmic emulsion 1 drop 2 (two) times daily.    [provider]  dorzolamide-timolol (COSOPT) 22.3-6.8 MG/ML  ophthalmic solution Place 1 drop into both eyes 2 (two) times daily. 11/19/18   Binnie Rail, MD  famotidine (PEPCID) 20 MG tablet Take 1 tablet (20 mg total) by mouth 2 (two) times daily as needed for heartburn or indigestion. 04/13/19   Pyrtle, Lajuan Lines, MD  Flaxseed MISC Take 5 mLs by mouth daily. Take 1 tsp daily    [provider]  furosemide (LASIX) 20 MG tablet Take 0.5 tablets (10 mg total) by mouth as needed. 01/20/20   Binnie Rail, MD  Glucos-Chond-Hyal Ac-Ca Fructo (MOVE FREE JOINT HEALTH ADVANCE PO) Take by mouth.    [provider]  losartan (COZAAR)  25 MG tablet Take 1 tablet by mouth twice daily 03/21/20   Binnie Rail, MD  Magnesium Oxide 250 MG TABS Take by mouth. 09/22/09   [provider]  Multiple Vitamin (MULTIVITAMIN) tablet Take 1 tablet by mouth daily.      [provider]  naproxen sodium (ALEVE) 220 MG tablet Take 220 mg by mouth as needed.     [provider]  polyethylene glycol (MIRALAX / GLYCOLAX) packet Take 17 g by mouth daily as needed for moderate constipation.     [provider]  predniSONE (DELTASONE) 2.5 MG tablet TAKE 1 TABLET BY MOUTH EVERY OTHER DAY 02/17/20   Binnie Rail, MD  Probiotic Product (PROBIOTIC DAILY PO) Take by mouth.    [provider]  psyllium (METAMUCIL) 58.6 % packet Take 1 packet by mouth daily as needed.    [provider]  trolamine salicylate (ASPERCREME) 10 % cream Apply 1 application topically as needed for muscle pain.    [provider]    Allergies    Aspirin, Sulfa antibiotics, Contrast media [iodinated diagnostic agents], Lexapro [escitalopram], Fluoxetine, Celexa [citalopram], and Statins  Review of Systems   Review of Systems  Constitutional: Negative for fever.  Respiratory: Negative for shortness of breath.   Cardiovascular: Negative for chest pain.  Gastrointestinal: Negative for abdominal pain.  Musculoskeletal: Positive for  arthralgias.  Neurological: Positive for light-headedness. Negative for headaches.  All other systems reviewed and are negative.   Physical Exam Updated Vital Signs BP (!) 204/105 (BP Location: Right Arm)   Pulse (!) 101   Temp 97.6 F (36.4 C) (Oral)   Resp 20   Ht 1.549 m (5\' 1" )   Wt 65.8 kg   SpO2 98%   BMI 27.40 kg/m   Physical Exam CONSTITUTIONAL: elderly, no distress HEAD: Normocephalic/atraumatic EYES: EOMI/PERRL, no nystagmus ENMT: Mucous membranes moist NECK: supple no meningeal signs, no bruits CV: S1/S2 noted, no murmurs/rubs/gallops noted LUNGS: Lungs are clear to auscultation bilaterally, no apparent distress ABDOMEN: soft, nontender, no rebound or guarding GU:no cva tenderness NEURO:Awake/alert, face symmetric, no arm or leg drift is noted Equal 5/5 strength with shoulder abduction, elbow flex/extension, wrist flex/extension in upper extremities and equal hand grips bilaterally Equal 5/5 strength with hip flexion,knee flex/extension, foot dorsi/plantar flexion Cranial nerves 3/4/5/6/11/12/08/11/12 tested and intact No past pointing Sensation to light touch intact in all extremities EXTREMITIES: pulses normal, full ROM SKIN: warm, color normal PSYCH: no abnormalities of mood noted  ED Results / Procedures / Treatments   Labs (all labs ordered are listed, but only abnormal results are displayed) Labs Reviewed  CBC - Abnormal; Notable for the following components:      Result Value   WBC 13.7 (*)    All other components within normal limits  BASIC METABOLIC PANEL    EKG EKG Interpretation  Date/Time:  Wednesday March 23 2020 03:28:29 EST Ventricular Rate:  81 PR Interval:    QRS Duration: 83 QT Interval:  387 QTC Calculation: 450 R Axis:   -8 Text Interpretation: Sinus rhythm Abnormal R-wave progression, early transition ST elevation, consider inferior injury Confirmed by Ripley Fraise [phone]) on 03/23/2020 4:01:12 AM   Radiology No  results found.  Procedures Procedures    Medications Ordered in ED Medications  losartan (COZAAR) tablet 25 mg (25 mg Oral Given 03/23/20 0329)    ED Course  I have reviewed the triage vital signs and the nursing notes.  Pertinent labs   results that were  available during my care of the patient were reviewed by me and considered in my medical decision making (see chart for details).    MDM Rules/Calculators/A&P                          Patient reports recently starting prednisone, and then tonight noted that she was hypertensive after feeling flushed and lightheaded.  She had no significant headache or focal weakness.  No signs of acute stroke at this time. Labs are pending at this time.  She was given home dose of her Cozaar 5:32 AM Patient improved.  She ambulated without difficulty.  Labs are reassuring.  Blood pressure is now trending downward BP (!) 181/66   Pulse 72   Temp 97.6 F (36.4 C) (Oral)   Resp 15   Ht 1.549 m (5\' 1" )   Wt 65.8 kg   SpO2 98%   BMI 27.40 kg/m  Patient feels comfortable for discharge home. She plans to stop the prednisone for now.  She had only a few doses. She will measure her blood pressure every day. She would then follow with her PCP in a week in case her medications need to be adjusted.  Final Clinical Impression(s) / ED Diagnoses Final diagnoses:  Primary hypertension  Dizziness    Rx / DC Orders ED Discharge Orders    None       Ripley Fraise, MD 03/23/20 [phone]

## 2020-03-23 NOTE — ED Notes (Signed)
E-signature pad unavailable at time of pt discharge. This RN discussed discharge materials with pt and answered all pt questions. Pt stated understanding of discharge material. ? ?

## 2020-03-24 ENCOUNTER — Ambulatory Visit: Payer: Medicare HMO | Admitting: Neurology

## 2020-03-28 ENCOUNTER — Other Ambulatory Visit: Payer: Self-pay | Admitting: *Deleted

## 2020-03-28 ENCOUNTER — Telehealth: Payer: Self-pay

## 2020-03-28 ENCOUNTER — Encounter: Payer: Self-pay | Admitting: Medical

## 2020-03-28 ENCOUNTER — Telehealth (INDEPENDENT_AMBULATORY_CARE_PROVIDER_SITE_OTHER): Payer: Medicare HMO | Admitting: Medical

## 2020-03-28 VITALS — BP 112/62 | HR 71 | Ht 61.0 in | Wt 146.0 lb

## 2020-03-28 DIAGNOSIS — I2510 Atherosclerotic heart disease of native coronary artery without angina pectoris: Secondary | ICD-10-CM | POA: Diagnosis not present

## 2020-03-28 DIAGNOSIS — E785 Hyperlipidemia, unspecified: Secondary | ICD-10-CM | POA: Diagnosis not present

## 2020-03-28 DIAGNOSIS — I10 Essential (primary) hypertension: Secondary | ICD-10-CM | POA: Diagnosis not present

## 2020-03-28 DIAGNOSIS — I83892 Varicose veins of left lower extremities with other complications: Secondary | ICD-10-CM

## 2020-03-28 DIAGNOSIS — I739 Peripheral vascular disease, unspecified: Secondary | ICD-10-CM

## 2020-03-28 NOTE — Progress Notes (Signed)
Virtual Visit via Telephone Note   This visit type was conducted due to national recommendations for restrictions regarding the COVID-19 Pandemic (e.g. social distancing) in an effort to limit this patient's exposure and mitigate transmission in our community.  Due to her co-morbid illnesses, this patient is at least at moderate risk for complications without adequate follow up.  This format is felt to be most appropriate for this patient at this time.  The patient did not have access to video technology/had technical difficulties with video requiring transitioning to audio format only (telephone).  All issues noted in this document were discussed and addressed.  No physical exam could be performed with this format.  Please refer to the patient's chart for her  consent to telehealth for [facility].    Date:  03/28/2020   ID:  Tamara Robertson, DOB 16-Feb-1930, MRN [id number] The patient was identified using 2 identifiers.  Patient Location: Home Provider Location: Home Office  PCP:  Pincus Sanes, MD  Cardiologist:  Rollene Rotunda, MD  Electrophysiologist:  None   Evaluation Performed:  Follow-Up Visit  Chief Complaint:  Recent high blood pressure  History of Present Illness:    Tamara Robertson is a 86 y.o. female with PMH of non-obstructive CAD (40% RCA stenosis on LHC in 2012), HTN, HLD, and GERD, who presents for the evaluation of elevated blood pressures.  She presents today for follow-up after recent ED visit for elevated blood pressures. She reports she had started taking a steroid dose pack for arthritis pains a few days prior to her ED visit. She did note some increased palpitations during that time which were not terribly bothersome and have since settle down. Cardiac monitor earlier this year suggested some short runs of SVT. On the night she presented to the ED she had a particularly bad episode of dizziness and when she took her blood pressure that evening it was  significantly elevated above her norm to 200s/100s prompting her to activate EMS. On evaluation in the ED her blood pressure was confirmed to be elevated. EKG showed NSR with no ischemic changes. Blood work was unremarkable, though troponins were not obtained. She was ultimately discharged home after receiving home losartan 25mg . Over the past several days, BP's have been generally stable with only one reading with SBP in the 150s, though frequently in the 110s/60s. She has no complaints of chest pain, SOB, dizziness, lightheadedness, or syncope.    The patient does not have symptoms concerning for COVID-19 infection (fever, chills, cough, or new shortness of breath).   Past Medical History:  Diagnosis Date  . Anxiety   . CAD (coronary artery disease)    RCA 40% stenosis cath 01/2011  . Cholelithiasis    s/p lap chole 09/2014  . Diverticular stricture [facility] 2006   [facility]  . DIVERTICULITIS, HX OF   . Diverticulosis   . DYSLIPIDEMIA   . Elevated LFTs   . GERD   . Glaucoma   . Hepatic steatosis   . HOH (hard of hearing)   . Immune thrombocytopenic purpura (HCC)    chronic - baseline 80-100K, on pred  . Irritable bowel syndrome   . Left ovarian cyst dx 01/2013 CT   working with gyn, ?malignant - elevated tumor marker OVA1  . OSTEOARTHRITIS, KNEE, RIGHT   . OSTEOPENIA   . OVERACTIVE BLADDER   . Prolapse of female bladder, acquired 11/2017  . UNSPECIFIED PERIPHERAL VASCULAR DISEASE   . URINARY INCONTINENCE  Past Surgical History:  Procedure Laterality Date  . ABDOMINAL HYSTERECTOMY  1963  . ANGIOPLASTY    . APPENDECTOMY  1956  . CARDIAC CATHETERIZATION    . CATARACT EXTRACTION, BILATERAL  10/2010  . CHOLECYSTECTOMY N/A 09/16/2014   Procedure: LAPAROSCOPIC CHOLECYSTECTOMY ;  Surgeon: Emelia Loron, MD;  Location: [facility] OR;  Service: General;  Laterality: N/A;  . KNEE ARTHROSCOPY Right   . L pop PTA  10/2009   stent  . LAPAROSCOPIC SIGMOID COLECTOMY  10/2005    . SPLENECTOMY  1954  . VARICOSE VEIN SURGERY Right 1962     Current Meds  Medication Sig  . acetaminophen (TYLENOL) 500 MG tablet Take 500 mg by mouth every 6 (six) hours as needed.  . Biotin 2500 MCG CAPS Take 1 capsule by mouth daily.   . calcium carbonate (TUMS - DOSED IN MG ELEMENTAL CALCIUM) 500 MG chewable tablet Chew 1 tablet by mouth daily as needed for indigestion or heartburn.  . Calcium-Magnesium-Vitamin D (CITRACAL CALCIUM+D) 600-40-500 MG-MG-UNIT TB24 Take 1 tablet by mouth daily.   . cycloSPORINE (RESTASIS) 0.05 % ophthalmic emulsion 1 drop 2 (two) times daily.  . dorzolamide (TRUSOPT) 2 % ophthalmic solution 1 drop 2 (two) times daily.  . dorzolamide-timolol (COSOPT) 22.3-6.8 MG/ML ophthalmic solution Place 1 drop into both eyes 2 (two) times daily.  [name] escitalopram (LEXAPRO) 5 MG tablet   . famotidine (PEPCID) 20 MG tablet Take 1 tablet (20 mg total) by mouth 2 (two) times daily as needed for heartburn or indigestion.  . Flaxseed MISC Take 5 mLs by mouth daily. Take 1 tsp daily  . furosemide (LASIX) 20 MG tablet Take 0.5 tablets (10 mg total) by mouth as needed.  . Glucos-Chond-Hyal Ac-Ca Fructo (MOVE FREE JOINT HEALTH ADVANCE PO) Take by mouth.  . latanoprost (XALATAN) 0.005 % ophthalmic solution 1 drop at bedtime.  [name] losartan (COZAAR) 25 MG tablet Take 1 tablet by mouth twice daily  . Magnesium Oxide 250 MG TABS Take by mouth.  . Multiple Vitamin (MULTIVITAMIN) tablet Take 1 tablet by mouth daily.    . naproxen sodium (ALEVE) 220 MG tablet Take 220 mg by mouth as needed.   . polyethylene glycol (MIRALAX / GLYCOLAX) packet Take 17 g by mouth daily as needed for moderate constipation.   . predniSONE (DELTASONE) 2.5 MG tablet TAKE 1 TABLET BY MOUTH EVERY OTHER DAY  . Probiotic Product (PROBIOTIC DAILY PO) Take by mouth.  . psyllium (METAMUCIL) 58.6 % packet Take 1 packet by mouth daily as needed.  . trolamine salicylate (ASPERCREME) 10 % cream Apply 1 application topically  as needed for muscle pain.     Allergies:   Aspirin, Sulfa antibiotics, Contrast media [iodinated diagnostic agents], Lexapro [escitalopram], Fluoxetine, Celexa [citalopram], and Statins   Social History   Tobacco Use  . Smoking status: Never Smoker  . Smokeless tobacco: Never Used  Vaping Use  . Vaping Use: Never used  Substance Use Topics  . Alcohol use: No    Alcohol/week: 0.0 standard drinks    Comment: rarely  . Drug use: No     Family Hx: The patient's family history includes Arthritis in an other family member; Coronary artery disease in her mother; Heart attack (age of onset: 22) in her mother; Hyperlipidemia in her daughter and mother; Hypertension in her daughter and mother; Stomach cancer in her father; Transient ischemic attack in an other family member. There is no history of Colon cancer.  ROS:   Please see the history of  present illness.     All other systems reviewed and are negative.   Prior CV studies:   The following studies were reviewed today:  Echocardiogram 2019: - Left ventricle: The cavity size was normal. Wall thickness was  increased in a pattern of moderate LVH. Systolic function was  normal. The estimated ejection fraction was in the range of 60%  to 65%. Wall motion was normal; there were no regional wall  motion abnormalities. Diastolic dysfunction, grade indeterminate.  Elevated filling pressures.  - Aortic valve: Moderately calcified leaflets. Leaflets were  suboptimally visualized. There was moderate stenosis. There was  trivial regurgitation. Peak velocity (S): 292 cm/s. Mean gradient  (S): 20 mm Hg. Valve area (VTI): 1.19 cm^2. Valve area (Vmax):  1.07 cm^2. Valve area (Vmean): 1.12 cm^2.  - Mitral valve: There was mild to moderate regurgitation.  - Tricuspid valve: There was mild regurgitation.  - Pulmonary arteries: PA peak pressure: 37 mm Hg (S).  Labs/Other Tests and Data Reviewed:    EKG:  An ECG dated  03/23/20 was personally reviewed today and demonstrated:  sinus rhythm with rate 81 bpm, non-specific T wave abnormalities, no STE/D  Recent Labs: 01/20/2020: ALT 14 03/23/2020: BUN 22; Creatinine, Ser 0.87; Hemoglobin 14.1; Platelets 227; Potassium 4.0; Sodium 142   Recent Lipid Panel Lab Results  Component Value Date/Time   CHOL 196 07/04/2017 11:50 AM   CHOL 215 (H) 06/22/2014 08:25 AM   TRIG 193 (H) 06/30/2017 07:08 AM   TRIG 161 (H) 06/22/2014 08:25 AM   TRIG 228 02/20/2010 12:00 AM   HDL 58.40 05/23/2017 11:51 AM   HDL 51 06/22/2014 08:25 AM   CHOLHDL 3 05/23/2017 11:51 AM   LDLCALC 107 (H) 05/23/2017 11:51 AM   LDLCALC 132 (H) 06/22/2014 08:25 AM   LDLDIRECT 155.9 03/19/2014 08:28 AM    Wt Readings from Last 3 Encounters:  03/28/20 146 lb (66.2 kg)  03/23/20 145 lb (65.8 kg)  03/08/20 147 lb (66.7 kg)     Risk Assessment/Calculations:      Objective:    Vital Signs:  BP 112/62   Pulse 71   Ht 5\' 1"  (1.549 m)   Wt 146 lb (66.2 kg)   BMI 27.59 kg/m    VITAL SIGNS:  reviewed GEN:  no acute distress RESPIRATORY:  speaking in full sentences without SOB CARDIOVASCULAR:  no peripheral edema NEURO:  A&O x3 PSYCH:  normal affect  ASSESSMENT & PLAN:    1. HTN: BP recently spiked to 200s/100s, though since her ED visit have been generally stable with readings generally in the 110s/60s. Possible the steroids/arthritis pains caused BP to spike.  - Favor continuing losartan 25mg  BID as prescribed.  - If BP trends upwards can uptitrate losartan if needed  2. CAD: non-obstructive disease. No anginal complaints and EKG appeared non-ischemic during recent hospital visit. Not on aspirin due to allergy. No on statin due to intolerance.  - Continue lifestyle/dietary modifications to promote LDL <70, A1C <7 - Continue losartan to maintain goal BP <130/80.  3. HLD: No recent lipids on file. Not on statin due to intolerance - Continue lifestyle/dietary modifications to promote  LDL <70   Shared Decision Making/Informed Consent        COVID-19 Education: The signs and symptoms of COVID-19 were discussed with the patient and how to seek care for testing (follow up with PCP or arrange E-visit).  The importance of social distancing was discussed today.  Time:   Today, I have spent 20  minutes with the patient with telehealth technology discussing the above problems.     Medication Adjustments/Labs and Tests Ordered: Current medicines are reviewed at length with the patient today.  Concerns regarding medicines are outlined above.   Tests Ordered: No orders of the defined types were placed in this encounter.   Medication Changes: No orders of the defined types were placed in this encounter.   Follow Up:  In Person in 6 month(s) with Dr. Antoine Poche  Signed, Beatriz Stallion, PA-C  03/28/2020 3:04 PM    [facility]

## 2020-03-28 NOTE — Telephone Encounter (Signed)
Spoke with Ms. Denzler. Gave verbal instructions for After Visit Summary

## 2020-03-28 NOTE — Patient Instructions (Signed)
Medication Instructions:  No changes  *If you need a refill on your cardiac medications before your next appointment, please call your pharmacy*   Lab Work: No Labs If you have labs (blood work) drawn today and your tests are completely normal, you will receive your results only by: Tamara Robertson MyChart Message (if you have MyChart) OR . A paper copy in the mail If you have any lab test that is abnormal or we need to change your treatment, we will call you to review the results.   Testing/Procedures: No Testing   Follow-Up: At [facility], you and your health needs are our priority.  As part of our continuing mission to provide you with exceptional heart care, we have created designated Provider Care Teams.  These Care Teams include your primary Cardiologist (physician) and Advanced Practice Providers (APPs -  Physician Assistants and Nurse Practitioners) who all work together to provide you with the care you need, when you need it.   Your next appointment:   6 month(s)  The format for your next appointment:   In Person  Provider:   [name], MD   Other Instructions Monitor Blood Pressure if Blood Pressure Greater that 140/80, Please call our office.

## 2020-03-28 NOTE — Telephone Encounter (Signed)
Ms. Tamara Robertson, Tamara Robertson are scheduled for a virtual visit with your provider today.    Just as we do with appointments in the office, we must obtain your consent to participate.  Your consent will be active for this visit and any virtual visit you may have with one of our providers in the next 365 days.    If you have a MyChart account, I can also send a copy of this consent to you electronically.  All virtual visits are billed to your insurance company just like a traditional visit in the office.  As this is a virtual visit, video technology does not allow for your provider to perform a traditional examination.  This may limit your provider's ability to fully assess your condition.  If your provider identifies any concerns that need to be evaluated in person or the need to arrange testing such as labs, EKG, etc, we will make arrangements to do so.    Although advances in technology are sophisticated, we cannot ensure that it will always work on either your end or our end.  If the connection with a video visit is poor, we may have to switch to a telephone visit.  With either a video or telephone visit, we are not always able to ensure that we have a secure connection.   I need to obtain your verbal consent now.   Are you willing to proceed with your visit today?   Tamara Robertson has provided verbal consent on 03/28/2020 for a virtual visit (video or telephone).   Monia Pouch, [place] 03/28/2020  2:41 PM

## 2020-03-29 DIAGNOSIS — H6123 Impacted cerumen, bilateral: Secondary | ICD-10-CM | POA: Diagnosis not present

## 2020-04-04 NOTE — Progress Notes (Signed)
Subjective:    Patient ID: Tamara Robertson, female    DOB: Oct 18, 1929, 86 y.o.   MRN: [id number]  HPI The patient is here for an acute visit to discuss her blood pressure.   She is taking all of her medications as prescribed.    She went to the ED recently for very high BP.  This was related to her prednisone taper she was on for her heel bursitis.    She is having palpitations - that is not new - typically they have lasted  1-2 min, but her more recent palpitaions now are more of a racing that lasts 1-2 hours - feels it in her substernal a rea. She denies chest pain.  She may have some SOB with the palpitations - maybe.     BP 111/63-170/80  She started lexapro 2.5 mg daily earlier this month and is concerned that may be the cause of the heart racing/palpitations.    Medications and allergies reviewed with patient and updated if appropriate.  Patient Active Problem List   Diagnosis Date Noted  . Educated about COVID-19 virus infection 08/13/2019  . Posterior neck pain 08/03/2019  . Dizziness 08/03/2019  . Other headache syndrome 08/03/2019  . Diarrhea 03/13/2019  . Nonrheumatic aortic valve stenosis 02/26/2019  . Sensorineural hearing loss (SNHL), bilateral 01/30/2019  . Chronic ITP (idiopathic thrombocytopenia) (HCC) 12/01/2018  . Lightheadedness 11/19/2018  . Epistaxis 11/19/2018  . 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 Rash 03/25/2018  . Chronic left-sided headaches 01/28/2018  . Depression 12/18/2017  . SBO (small bowel obstruction) (HCC) 09/20/2017  . Aortic stenosis, moderate 08/22/2017  . CAD (coronary artery disease) 08/21/2017  . Pleural effusion on right   . Streptococcal pneumonia (HCC) 06/26/2017  . Community acquired pneumonia of right lower lobe of lung 06/25/2017  . Current chronic use of systemic steroids 06/25/2017  . Chronic lower back pain 05/14/2017  . Varicose veins of left lower extremity with complications 03/05/2017  . Bilateral hearing  loss 02/01/2017  . Hypertension 07/31/2016  . Leg edema 07/31/2016  . Prediabetes 06/05/2016  . Osteopenia 12/01/2015  . Anxiety and depression 07/19/2015  . Degenerative cervical disc 12/28/2014  . Left ovarian cyst   . Peripheral neuropathy 06/03/2012  . PVD (peripheral vascular disease) (HCC) 11/23/2010  . Bursitis of hip 10/26/2010  . Hyperlipidemia 06/27/2010  . IMMUNE THROMBOCYTOPENIC PURPURA 06/27/2010  . Unspecified glaucoma 06/27/2010  . GERD 06/27/2010  . Irritable bowel syndrome 06/27/2010  . OVERACTIVE BLADDER 06/27/2010  . OSTEOARTHRITIS, KNEE, RIGHT 06/27/2010  . URINARY INCONTINENCE 06/27/2010    Current Outpatient Medications on File Prior to Visit  Medication Sig Dispense Refill  . acetaminophen (TYLENOL) 500 MG tablet Take 500 mg by mouth every 6 (six) hours as needed.    . Biotin 2500 MCG CAPS Take 1 capsule by mouth daily.     . calcium carbonate (TUMS - DOSED IN MG ELEMENTAL CALCIUM) 500 MG chewable tablet Chew 1 tablet by mouth daily as needed for indigestion or heartburn.    . Calcium-Magnesium-Vitamin D (CITRACAL CALCIUM+D) 600-40-500 MG-MG-UNIT TB24 Take 1 tablet by mouth daily.     . cycloSPORINE (RESTASIS) 0.05 % ophthalmic emulsion 1 drop 2 (two) times daily.    . dorzolamide-timolol (COSOPT) 22.3-6.8 MG/ML ophthalmic solution Place 1 drop into both eyes 2 (two) times daily. 10 mL 12  . escitalopram (LEXAPRO) 5 MG tablet 2.5 mg. Patient takes 2.5 mg instead of 5 mg    . famotidine (PEPCID) 20 MG tablet  Take 1 tablet (20 mg total) by mouth 2 (two) times daily as needed for heartburn or indigestion. 60 tablet 3  . Flaxseed MISC Take 5 mLs by mouth daily. Take 1 tsp daily    . furosemide (LASIX) 20 MG tablet Take 0.5 tablets (10 mg total) by mouth as needed. 45 tablet 2  . Glucos-Chond-Hyal Ac-Ca Fructo (MOVE FREE JOINT HEALTH ADVANCE PO) Take by mouth.    . Magnesium Oxide 250 MG TABS Take by mouth.    . Multiple Vitamin (MULTIVITAMIN) tablet Take 1 tablet  by mouth daily.      . naproxen sodium (ALEVE) 220 MG tablet Take 220 mg by mouth as needed.     . polyethylene glycol (MIRALAX / GLYCOLAX) packet Take 17 g by mouth daily as needed for moderate constipation.     . predniSONE (DELTASONE) 2.5 MG tablet TAKE 1 TABLET BY MOUTH EVERY OTHER DAY 45 tablet 2  . Probiotic Product (PROBIOTIC DAILY PO) Take by mouth.    . psyllium (METAMUCIL) 58.6 % packet Take 1 packet by mouth daily as needed.    . trolamine salicylate (ASPERCREME) 10 % cream Apply 1 application topically as needed for muscle pain.    [name] losartan (COZAAR) 25 MG tablet Take 1 tablet (25 mg total) by mouth 2 (two) times daily. 180 tablet 3   No current facility-administered medications on file prior to visit.    Past Medical History:  Diagnosis Date  . Anxiety   . CAD (coronary artery disease)    RCA 40% stenosis cath 01/2011  . Cholelithiasis    s/p lap chole 09/2014  . Diverticular stricture [facility]) 2006   [facility]  . DIVERTICULITIS, HX OF   . Diverticulosis   . DYSLIPIDEMIA   . Elevated LFTs   . GERD   . Glaucoma   . Hepatic steatosis   . HOH (hard of hearing)   . Immune thrombocytopenic purpura (HCC)    chronic - baseline 80-100K, on pred  . Irritable bowel syndrome   . Left ovarian cyst dx 01/2013 CT   working with gyn, ?malignant - elevated tumor marker OVA1  . OSTEOARTHRITIS, KNEE, RIGHT   . OSTEOPENIA   . OVERACTIVE BLADDER   . Prolapse of female bladder, acquired 11/2017  . UNSPECIFIED PERIPHERAL VASCULAR DISEASE   . URINARY INCONTINENCE     Past Surgical History:  Procedure Laterality Date  . ABDOMINAL HYSTERECTOMY  1963  . ANGIOPLASTY    . APPENDECTOMY  1956  . CARDIAC CATHETERIZATION    . CATARACT EXTRACTION, BILATERAL  10/2010  . CHOLECYSTECTOMY N/A 09/16/2014   Procedure: LAPAROSCOPIC CHOLECYSTECTOMY ;  Surgeon: Emelia Loron, MD;  Location: [facility];  Service: General;  Laterality: N/A;  . KNEE ARTHROSCOPY Right   . L pop PTA   10/2009   stent  . LAPAROSCOPIC SIGMOID COLECTOMY  10/2005  . SPLENECTOMY  1954  . VARICOSE VEIN SURGERY Right 1962    Social History   Socioeconomic History  . Marital status: Widowed    Spouse name: Not on file  . Number of children: 2  . Years of education: Not on file  . Highest education level: Not on file  Occupational History  . Occupation: Retired    Comment: Programmer, multimedia  Tobacco Use  . Smoking status: Never Smoker  . Smokeless tobacco: Never Used  Vaping Use  . Vaping Use: Never used  Substance and Sexual Activity  . Alcohol use: No  Alcohol/week: 0.0 standard drinks    Comment: rarely  . Drug use: No  . Sexual activity: Not Currently    Comment: 1st intercourse- 19, partners- 2,  widow  Other Topics Concern  . 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    [place] to [place] from [place] 05/2010 to be close to kids   Social Determinants of Health   Financial Resource Strain: Low Risk   . Difficulty of Paying Living Expenses: Not very hard  Food Insecurity:   . Worried About Programme researcher, broadcasting/film/video in the Last Year: Not on file  . Ran Out of Food in the Last Year: Not on file  Transportation Needs:   . Lack of Transportation (Medical): Not on file  . Lack of Transportation (Non-Medical): Not on file  Physical Activity:   . Days of Exercise per Week: Not on file  . Minutes of Exercise per Session: Not on file  Stress:   . Feeling of Stress : Not on file  Social Connections:   . Frequency of Communication with Friends and Family: Not on file  . Frequency of Social Gatherings with Friends and Family: Not on file  . Attends Religious Services: Not on file  . Active Member of Clubs or Organizations: Not on file  . Attends Banker Meetings: Not on file  . Marital Status: Not on file    Family History  Problem Relation Age of Onset  . Coronary artery disease Mother   . Heart  attack Mother 50  . Hyperlipidemia Mother   . Hypertension Mother   . Stomach cancer Father   . Hypertension Daughter   . Hyperlipidemia Daughter   . Arthritis Other        parent  . Transient ischemic attack Other        parent  . Colon cancer Neg Hx     Review of Systems  Constitutional: Negative for fever.  Respiratory: Positive for shortness of breath.   Cardiovascular: Positive for palpitations. Negative for chest pain and leg swelling.  Neurological: Positive for light-headedness (a little at times) and headaches (occ).       Objective:   Vitals:   04/05/20 1337  BP: (!) 146/70  Pulse: 82  Temp: 98.2 F (36.8 C)  SpO2: 97%   BP Readings from Last 3 Encounters:  04/05/20 (!) 146/70  03/28/20 112/62  03/23/20 (!) 181/66   Wt Readings from Last 3 Encounters:  04/05/20 147 lb 12.8 oz (67 kg)  03/28/20 146 lb (66.2 kg)  03/23/20 145 lb (65.8 kg)   Body mass index is 27.93 kg/m.   Physical Exam    Constitutional: Appears well-developed and well-nourished. No distress.  Head: Normocephalic and atraumatic.  Neck: Neck supple. No tracheal deviation present. No thyromegaly present.  No cervical lymphadenopathy Cardiovascular: Normal rate, regular rhythm and normal heart sounds.  2/6 sys murmur heard. No carotid bruit .  No edema Pulmonary/Chest: Effort normal and breath sounds normal. No respiratory distress. No has no wheezes. No rales.  Skin: Skin is warm and dry. Not diaphoretic.  Psychiatric: Normal mood and affect. Behavior is normal.    EKG from ED reviewed.  NSR w/o acute changes.     Assessment & Plan:    See Problem List for Assessment and Plan of chronic medical problems.    This visit occurred during the SARS-CoV-2 public health emergency.  Safety protocols were in place,  including screening questions prior to the visit, additional usage of staff PPE, and extensive cleaning of exam room while observing appropriate contact time as indicated for  disinfecting solutions.

## 2020-04-05 ENCOUNTER — Other Ambulatory Visit: Payer: Self-pay

## 2020-04-05 ENCOUNTER — Encounter: Payer: Self-pay | Admitting: Internal Medicine

## 2020-04-05 ENCOUNTER — Ambulatory Visit (INDEPENDENT_AMBULATORY_CARE_PROVIDER_SITE_OTHER): Payer: Medicare HMO | Admitting: Internal Medicine

## 2020-04-05 DIAGNOSIS — R002 Palpitations: Secondary | ICD-10-CM

## 2020-04-05 DIAGNOSIS — F3289 Other specified depressive episodes: Secondary | ICD-10-CM | POA: Diagnosis not present

## 2020-04-05 DIAGNOSIS — I10 Essential (primary) hypertension: Secondary | ICD-10-CM

## 2020-04-05 DIAGNOSIS — R69 Illness, unspecified: Secondary | ICD-10-CM | POA: Diagnosis not present

## 2020-04-05 MED ORDER — LOSARTAN POTASSIUM 25 MG PO TABS: 25.0000 mg | ORAL_TABLET | Freq: Two times a day (BID) | ORAL | 3 refills | Status: DC

## 2020-04-05 NOTE — Assessment & Plan Note (Signed)
Chronic BP variable at home - 987-215 systolic When she has been on more medication she was lightheaded so I am concerned about increasing her medication No change in medication today - continue losartan 25 mg twice daily She will monitor BP home She will see cardiology for the palpitations and get their input about her BP

## 2020-04-05 NOTE — Assessment & Plan Note (Signed)
Chronic, but changed Has had intermittent palpitations for a while - they would last 1-2 minutes only --  Now she is having heart racing/palpitations lasting 1-2 hours ? Related to lexapro vs other Will f/u with cardiology for further evaluation EKG from ED showed NSR w/o acute changes

## 2020-04-05 NOTE — Assessment & Plan Note (Signed)
Chronic Improved with lexapro 2.5 mg daily - not sure if this is causing the heart racing/palpiations - will see cardiology Continue lexapro 2.5 mg daily for now

## 2020-04-05 NOTE — Patient Instructions (Signed)
No change in medications  Continue to monitor your BP.    Follow up with Dr Percival Spanish.

## 2020-04-06 ENCOUNTER — Telehealth: Payer: Self-pay | Admitting: Medical

## 2020-04-06 ENCOUNTER — Telehealth: Payer: Medicare HMO

## 2020-04-06 DIAGNOSIS — R002 Palpitations: Secondary | ICD-10-CM

## 2020-04-06 NOTE — Telephone Encounter (Signed)
I am helping in triage today. Called and spoke with patient. Patient has history of palpitations but states these seem to be getting worse. They used to only last a couple of minutes and now she states they can last as long as 30-45 minutes. She occasional has some shortness of breath with this. No chest pain. She notes some occasional mild lightheadedness but states this is not necessarily associated with palpitations. No syncope. BP mostly in the 130's/70's (but can range from 110's to 160's to 170's). Heart monitor from earlier this year (April/May) showed normal sinus rhythm with short runs of SVT (longest run being 14 beats). Given short duration of SVT, no AV nodal agents were added.   Discussed possibly adding low dose Toprol-XL 12.5mg  daily to see if that would help with palpitations. She would like for me to check with Dr. Percival Spanish first. Therefore, will route note to Dr. Percival Spanish to see if he agrees or has any other recommendations.  Discussed that if patient has any worsening/persistent palpitations with associated dizziness, near syncope, syncope, chest pain, etc.,  she should go to the ED for further evaluation. Patient voiced understanding and agreed.   Darreld Mclean, PA-C 04/06/2020 3:15 PM

## 2020-04-06 NOTE — Telephone Encounter (Signed)
Agree with 12.5 mg of metoprolol.

## 2020-04-06 NOTE — Telephone Encounter (Signed)
Patient c/o Palpitations:  High priority if patient c/o lightheadedness, shortness of breath, or chest pain  1) How long have you had palpitations/irregular HR/ Afib? Are you having the symptoms now?  Not at this time, but palpitations seems to be getting worse  2) Are you currently experiencing lightheadedness, SOB or CP?  Sometimes lightheaded  3) Do you have a history of afib (atrial fibrillation) or irregular heart rhythm?  no  4) Have you checked your BP or HR? (document readings if available):had to go [facility] on 03-23-20 for high blood pressure  5) Are you experiencing any other symptoms? Sometimes short of breath

## 2020-04-07 MED ORDER — METOPROLOL SUCCINATE ER 25 MG PO TB24: 12.5000 mg | ORAL_TABLET | Freq: Every day | ORAL | 2 refills | Status: DC

## 2020-04-07 NOTE — Telephone Encounter (Signed)
Called and spoke with patient about Dr. Rosezella [place] recommendation. Will send in Toprol-XL 12.5mg  daily to requested Pharmacy.  Darreld Mclean, PA-C 04/07/2020 8:20 AM

## 2020-04-08 ENCOUNTER — Ambulatory Visit (INDEPENDENT_AMBULATORY_CARE_PROVIDER_SITE_OTHER)
Admission: RE | Admit: 2020-04-08 | Discharge: 2020-04-08 | Disposition: A | Discharge status: Home / Self Care | Payer: Medicare HMO | Source: Ambulatory Visit | Attending: Physician Assistant | Admitting: Physician Assistant

## 2020-04-08 ENCOUNTER — Encounter (HOSPITAL_COMMUNITY): Payer: Self-pay

## 2020-04-08 ENCOUNTER — Ambulatory Visit (INDEPENDENT_AMBULATORY_CARE_PROVIDER_SITE_OTHER)
Admission: RE | Admit: 2020-04-08 | Discharge: 2020-04-08 | Disposition: A | Discharge status: Home / Self Care | Payer: Medicare HMO | Source: Ambulatory Visit | Attending: Vascular Surgery | Admitting: Vascular Surgery

## 2020-04-08 ENCOUNTER — Ambulatory Visit (HOSPITAL_COMMUNITY)
Admission: RE | Admit: 2020-04-08 | Discharge: 2020-04-08 | Disposition: A | Discharge status: Home / Self Care | Payer: Medicare HMO | Source: Ambulatory Visit | Attending: Physician Assistant | Admitting: Physician Assistant

## 2020-04-08 ENCOUNTER — Ambulatory Visit: Payer: Medicare HMO | Admitting: Physician Assistant

## 2020-04-08 ENCOUNTER — Other Ambulatory Visit: Payer: Self-pay

## 2020-04-08 VITALS — BP 146/62 | HR 72 | Temp 98.1°F | Resp 20 | Ht 61.0 in | Wt 146.9 lb

## 2020-04-08 DIAGNOSIS — I739 Peripheral vascular disease, unspecified: Secondary | ICD-10-CM

## 2020-04-08 DIAGNOSIS — I83892 Varicose veins of left lower extremities with other complications: Secondary | ICD-10-CM

## 2020-04-08 NOTE — Progress Notes (Signed)
Office Note     CC:  follow up Requesting Provider:  Pincus Sanes, MD  HPI: Tamara Robertson is a 86 y.o. (01-16-1930) female who presents for follow up of peripheral artery disease. She has a history of a left SFA stent placed in 2011 in [place] and her PAD has been followed by Dr. Randie Heinz. She was last seen in May at which time she was having increased Left lower extremity pain mostly in buttock and left thigh. At the time clinically and on non invasive studies her lower extremities were well perfused and it was not felt that her symptoms were arterial in nature but she was given a closer follow up.  She presents today for 6 months follow up with exercise ABI and LLE duplex. She does continue to have left thigh cramping and left hip pain. She explains that she has bursitis in her left hip. The cramping is very intermittent. She most recently 4 days ago got a cramp in her left thigh and her medial thigh is still sore. She otherwise denies any pain in her calfs or pain that wakes her up at night. She has no non healing wounds. She does also have bursitis in her left heel which has really limited her activity. She says she use to walk daily but because of this she has not walked in several months  She also has history of venous insufficiency. She has had saphenous vein stripping of the RLE but none in her LLE due to her arterial disease with was recommended she hold off. She is unable to use her compression stockings due to struggling to get them on herself and also due to her bursitis in her right heel she says she cant tolerate it. She elevates her legs during the day in her recliner.  She denies any bleeding or venous ulcerations of bilateral lower extremities.  The pt is on a statin for cholesterol management.  The pt is not on a daily aspirin.   Other AC: none The pt is on ARB, diuretic for hypertension.   The pt is not diabetic.  Tobacco hx:  Never smoker  Past Medical  History:  Diagnosis Date  . Anxiety   . CAD (coronary artery disease)    RCA 40% stenosis cath 01/2011  . Cholelithiasis    s/p lap chole 09/2014  . Diverticular stricture [facility]) 2006   [facility]  . DIVERTICULITIS, HX OF   . Diverticulosis   . DYSLIPIDEMIA   . Elevated LFTs   . GERD   . Glaucoma   . Hepatic steatosis   . HOH (hard of hearing)   . Immune thrombocytopenic purpura (HCC)    chronic - baseline 80-100K, on pred  . Irritable bowel syndrome   . Left ovarian cyst dx 01/2013 CT   working with gyn, ?malignant - elevated tumor marker OVA1  . OSTEOARTHRITIS, KNEE, RIGHT   . OSTEOPENIA   . OVERACTIVE BLADDER   . Prolapse of female bladder, acquired 11/2017  . UNSPECIFIED PERIPHERAL VASCULAR DISEASE   . URINARY INCONTINENCE     Past Surgical History:  Procedure Laterality Date  . ABDOMINAL HYSTERECTOMY  1963  . ANGIOPLASTY    . APPENDECTOMY  1956  . CARDIAC CATHETERIZATION    . CATARACT EXTRACTION, BILATERAL  10/2010  . CHOLECYSTECTOMY N/A 09/16/2014   Procedure: LAPAROSCOPIC CHOLECYSTECTOMY ;  Surgeon: Emelia Loron, MD;  Location: [facility] OR;  Service: General;  Laterality: N/A;  . KNEE ARTHROSCOPY  Right   . L pop PTA  10/2009   stent  . LAPAROSCOPIC SIGMOID COLECTOMY  10/2005  . SPLENECTOMY  1954  . VARICOSE VEIN SURGERY Right 1962    Social History   Socioeconomic History  . Marital status: Widowed    Spouse name: Not on file  . Number of children: 2  . Years of education: Not on file  . Highest education level: Not on file  Occupational History  . Occupation: Retired    Comment: Programmer, multimedia  Tobacco Use  . Smoking status: Never Smoker  . Smokeless tobacco: Never Used  Vaping Use  . Vaping Use: Never used  Substance and Sexual Activity  . Alcohol use: No    Alcohol/week: 0.0 standard drinks    Comment: rarely  . Drug use: No  . Sexual activity: Not Currently    Comment: 1st intercourse- 19, partners- 2,  widow  Other  Topics Concern  . 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    [place] Ham to [place] from [place] 05/2010 to be close to kids   Social Determinants of Health   Financial Resource Strain: Low Risk   . Difficulty of Paying Living Expenses: Not very hard  Food Insecurity:   . Worried About Programme researcher, broadcasting/film/video in the Last Year: Not on file  . Ran Out of Food in the Last Year: Not on file  Transportation Needs:   . Lack of Transportation (Medical): Not on file  . Lack of Transportation (Non-Medical): Not on file  Physical Activity:   . Days of Exercise per Week: Not on file  . Minutes of Exercise per Session: Not on file  Stress:   . Feeling of Stress : Not on file  Social Connections:   . Frequency of Communication with Friends and Family: Not on file  . Frequency of Social Gatherings with Friends and Family: Not on file  . Attends Religious Services: Not on file  . Active Member of Clubs or Organizations: Not on file  . Attends Banker Meetings: Not on file  . Marital Status: Not on file  Intimate Partner Violence:   . Fear of Current or Ex-Partner: Not on file  . Emotionally Abused: Not on file  . Physically Abused: Not on file  . Sexually Abused: Not on file    Family History  Problem Relation Age of Onset  . Coronary artery disease Mother   . Heart attack Mother 42  . Hyperlipidemia Mother   . Hypertension Mother   . Stomach cancer Father   . Hypertension Daughter   . Hyperlipidemia Daughter   . Arthritis Other        parent  . Transient ischemic attack Other        parent  . Colon cancer Neg Hx     Current Outpatient Medications  Medication Sig Dispense Refill  . acetaminophen (TYLENOL) 500 MG tablet Take 500 mg by mouth every 6 (six) hours as needed.    . Biotin 2500 MCG CAPS Take 1 capsule by mouth daily.     . calcium carbonate (TUMS - DOSED IN MG ELEMENTAL CALCIUM) 500 MG chewable tablet Chew 1  tablet by mouth daily as needed for indigestion or heartburn.    . Calcium-Magnesium-Vitamin D (CITRACAL CALCIUM+D) 600-40-500 MG-MG-UNIT TB24 Take 1 tablet by mouth daily.     . cycloSPORINE (RESTASIS) 0.05 % ophthalmic emulsion 1 drop 2 (  two) times daily.    . dorzolamide-timolol (COSOPT) 22.3-6.8 MG/ML ophthalmic solution Place 1 drop into both eyes 2 (two) times daily. 10 mL 12  . escitalopram (LEXAPRO) 5 MG tablet 2.5 mg. Patient takes 2.5 mg instead of 5 mg    . famotidine (PEPCID) 20 MG tablet Take 1 tablet (20 mg total) by mouth 2 (two) times daily as needed for heartburn or indigestion. 60 tablet 3  . Flaxseed MISC Take 5 mLs by mouth daily. Take 1 tsp daily    . furosemide (LASIX) 20 MG tablet Take 0.5 tablets (10 mg total) by mouth as needed. 45 tablet 2  . Glucos-Chond-Hyal Ac-Ca Fructo (MOVE FREE JOINT HEALTH ADVANCE PO) Take by mouth.    . losartan (COZAAR) 25 MG tablet Take 1 tablet (25 mg total) by mouth 2 (two) times daily. 180 tablet 3  . Magnesium Oxide 250 MG TABS Take by mouth.    . metoprolol succinate (TOPROL-XL) 25 MG 24 hr tablet Take 0.5 tablets (12.5 mg total) by mouth daily. Take with or immediately following a meal. 45 tablet 2  . Multiple Vitamin (MULTIVITAMIN) tablet Take 1 tablet by mouth daily.      . naproxen sodium (ALEVE) 220 MG tablet Take 220 mg by mouth as needed.     . polyethylene glycol (MIRALAX / GLYCOLAX) packet Take 17 g by mouth daily as needed for moderate constipation.     . predniSONE (DELTASONE) 2.5 MG tablet TAKE 1 TABLET BY MOUTH EVERY OTHER DAY 45 tablet 2  . Probiotic Product (PROBIOTIC DAILY PO) Take by mouth.    . psyllium (METAMUCIL) 58.6 % packet Take 1 packet by mouth daily as needed.    . trolamine salicylate (ASPERCREME) 10 % cream Apply 1 application topically as needed for muscle pain.     No current facility-administered medications for this visit.    Allergies  Allergen Reactions  . Aspirin Other (See Comments)    ITP  .  Sulfa Antibiotics Other (See Comments)    dizziness  . Contrast Media [Iodinated Diagnostic Agents] Hives    CAT scan contrast only  . Lexapro [Escitalopram]     Hypertension  . Fluoxetine     Did not feel well on it   . Celexa [Citalopram] Other (See Comments)    Felt out of it  . Statins Other (See Comments)     REVIEW OF SYSTEMS:  [X]  denotes positive finding, [ ]  denotes negative finding Cardiac  Comments:  Chest pain or chest pressure:    Shortness of breath upon exertion:    Short of breath when lying flat:    Irregular heart rhythm:        Vascular    Pain in calf, thigh, or hip brought on by ambulation:    Pain in feet at night that wakes you up from your sleep:     Blood clot in your veins:    Leg swelling:         Pulmonary    Oxygen at home:    Productive cough:     Wheezing:         Neurologic    Sudden weakness in arms or legs:     Sudden numbness in arms or legs:     Sudden onset of difficulty speaking or slurred speech:    Temporary loss of vision in one eye:     Problems with dizziness:         Gastrointestinal    Blood  in stool:     Vomited blood:         Genitourinary    Burning when urinating:     Blood in urine:        Psychiatric    Major depression:         Hematologic    Bleeding problems:    Problems with blood clotting too easily:        Skin    Rashes or ulcers:        Constitutional    Fever or chills:      PHYSICAL EXAMINATION:  Vitals:   04/08/20 1520  BP: (!) 146/62  Pulse: 72  Resp: 20  Temp: 98.1 F (36.7 C)  TempSrc: Temporal  SpO2: 98%  Weight: 146 lb 14.4 oz (66.6 kg)  Height: 5\' 1"  (1.549 m)    General:  Very pleasant, well appearing, well nourished female in no distress; vital signs documented above Gait: Normal HENT: WNL, normocephalic Pulmonary: normal non-labored breathing , without Rales, rhonchi,  wheezing Cardiac: regular HR, without  Murmurs without carotid bruit Abdomen: soft, NT, no  masses Vascular Exam/Pulses:  Right Left  Radial 2+ (normal) 2+ (normal)  Ulnar 2+ (normal) 2+ (normal)  Femoral 2+ (normal) 2+ (normal)  Popliteal Not palpable Not palpable  DP 2+ (normal) 2+ (normal)  PT 1+ (weak) 1+ (weak)   Extremities: without ischemic changes, without Gangrene , without cellulitis; without open wounds; bilateral lower extremities with multiple varicose veins Musculoskeletal: no muscle wasting or atrophy  Neurologic: A&O X 3;  No focal weakness or paresthesias are detected Psychiatric:  The pt has Normal affect.   Non-Invasive Vascular Imaging:   +-------+-----------+-----------+------------+------------+  ABI/TBIToday's ABIToday's TBIPrevious ABIPrevious TBI  +-------+-----------+-----------+------------+------------+  Right 0.87    0.54    0.93    0.57      +-------+-----------+-----------+------------+------------+  Left  0.99    0.56    0.91    0.54      +-------+-----------+-----------+------------+------------+   Post exercise: Left ABI decreased with exercise, returning to normal within four minutes.   +-----------+--------+-----+--------+----------+--------+  LEFT    PSV cm/sRatioStenosisWaveform Comments  +-----------+--------+-----+--------+----------+--------+  CFA Prox  112          biphasic       +-----------+--------+-----+--------+----------+--------+  DFA    104          biphasic       +-----------+--------+-----+--------+----------+--------+  SFA Prox  120          biphasic       +-----------+--------+-----+--------+----------+--------+  SFA Mid  97          biphasic       +-----------+--------+-----+--------+----------+--------+  SFA Distal 164          biphasic       +-----------+--------+-----+--------+----------+--------+  POP Prox  91          biphasic        +-----------+--------+-----+--------+----------+--------+  POP Mid  100          biphasic       +-----------+--------+-----+--------+----------+--------+  POP Distal 110          biphasic       +-----------+--------+-----+--------+----------+--------+  ATA Distal 82          biphasic       +-----------+--------+-----+--------+----------+--------+  PTA Distal 14          monophasic      +-----------+--------+-----+--------+----------+--------+  PERO Distal22          monophasic      +-----------+--------+-----+--------+----------+--------+  ASSESSMENT/PLAN:: 86 y.o. female here for follow up for peripheral artery disease. Her symptoms are overall stable. Most of her complaints are secondary to her bursitis and also left thigh cramping which Im not entirely certain is related to her arterial disease as it is happening mostly at rest. Her cramping is not claudication in nature. Her bilateral lower extremities are well-perfused with a palpable 2+ DP pulse and ABIs within normal limits. Her Duplex shows tibial disease, which is known but no areas of stenosis and biphasic waveforms throughout. Her exercise ABI is slightly abnormal but also likely not fully adequate as she has a limp due to her bursitis currently. Also with known tibial disease I anticipate that she have some decrease post exercise.  Etiology of her left foot paresthesia is likely due to her previously diagnosed lumbar stenosis and neuropathy  She additionally has been diagnosed with left trochanteric bursitis and bursitis of her right calcaneous which have made ambulated difficult and painful  I encouraged her to continue to elevate her legs when possible during the day  She will return to office sooner if any rest pain or nonhealing wounds develop  I will bring her back again at a closer interval to follow up in  6 months with ABI and LLE arterial duplex   Graceann Congress, PA-C [facility] [phone]  Clinic MD:  Randie Heinz

## 2020-04-12 ENCOUNTER — Other Ambulatory Visit: Payer: Self-pay

## 2020-04-12 DIAGNOSIS — I739 Peripheral vascular disease, unspecified: Secondary | ICD-10-CM

## 2020-04-12 NOTE — Progress Notes (Signed)
Opened in error

## 2020-04-20 ENCOUNTER — Ambulatory Visit: Payer: Medicare HMO | Admitting: Internal Medicine

## 2020-04-21 DIAGNOSIS — M7661 Achilles tendinitis, right leg: Secondary | ICD-10-CM | POA: Diagnosis not present

## 2020-04-21 DIAGNOSIS — M1711 Unilateral primary osteoarthritis, right knee: Secondary | ICD-10-CM | POA: Diagnosis not present
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
## 2020-04-25 ENCOUNTER — Encounter: Payer: Self-pay | Admitting: Neurology

## 2020-04-25 ENCOUNTER — Ambulatory Visit: Payer: Medicare HMO | Admitting: Neurology

## 2020-04-25 DIAGNOSIS — Z029 Encounter for administrative examinations, unspecified: Secondary | ICD-10-CM

## 2020-05-05 NOTE — Telephone Encounter (Signed)
Tamara Robertson, note from patient via email received.  Staff came to me and asked me to consider discharging the patient because of no show policy.  She no showed 04/10/18 and same day cx on 03/24/20 and 04/25/20.  I told them not to discharge her via our policy but did charge her the fee ONE TIME as she has same day cx 2 times within a month of one another.  I thought that was the fair/just way to handle her case.

## 2020-05-10 ENCOUNTER — Encounter: Payer: Self-pay | Admitting: Neurology

## 2020-05-10 DIAGNOSIS — M79671 Pain in right foot: Secondary | ICD-10-CM | POA: Diagnosis not present

## 2020-05-11 ENCOUNTER — Telehealth: Payer: Self-pay | Admitting: Neurology

## 2020-05-11 DIAGNOSIS — I739 Peripheral vascular disease, unspecified: Secondary | ICD-10-CM | POA: Diagnosis not present

## 2020-05-11 DIAGNOSIS — K5900 Constipation, unspecified: Secondary | ICD-10-CM | POA: Diagnosis not present

## 2020-05-11 DIAGNOSIS — R69 Illness, unspecified: Secondary | ICD-10-CM | POA: Diagnosis not present

## 2020-05-11 DIAGNOSIS — I10 Essential (primary) hypertension: Secondary | ICD-10-CM | POA: Diagnosis not present

## 2020-05-11 DIAGNOSIS — I471 Supraventricular tachycardia: Secondary | ICD-10-CM | POA: Diagnosis not present

## 2020-05-11 DIAGNOSIS — Z008 Encounter for other general examination: Secondary | ICD-10-CM | POA: Diagnosis not present

## 2020-05-11 DIAGNOSIS — M1990 Unspecified osteoarthritis, unspecified site: Secondary | ICD-10-CM | POA: Diagnosis not present

## 2020-05-11 DIAGNOSIS — D693 Immune thrombocytopenic purpura: Secondary | ICD-10-CM | POA: Diagnosis not present

## 2020-05-11 DIAGNOSIS — R32 Unspecified urinary incontinence: Secondary | ICD-10-CM | POA: Diagnosis not present

## 2020-05-11 DIAGNOSIS — H409 Unspecified glaucoma: Secondary | ICD-10-CM | POA: Diagnosis not present

## 2020-05-11 DIAGNOSIS — H04129 Dry eye syndrome of unspecified lacrimal gland: Secondary | ICD-10-CM | POA: Diagnosis not present

## 2020-05-11 NOTE — Telephone Encounter (Signed)
Patient dismissed from [facility] by Dr. Lurena Joiner Tat , effective 05/10/20. Dismissal letter sent out by 1st class mail.[id number]

## 2020-05-17 DIAGNOSIS — H04123 Dry eye syndrome of bilateral lacrimal glands: Secondary | ICD-10-CM | POA: Diagnosis not present

## 2020-05-25 ENCOUNTER — Telehealth: Payer: Medicare HMO

## 2020-05-25 NOTE — Chronic Care Management (AMB) (Deleted)
Chronic Care Management Pharmacy  Name: Tamara Robertson  MRN: [id number] DOB: [birth year]   Chief Complaint/ HPI  Rudell Cobb,  87 y.o. , female presents for their Initial CCM visit with the clinical pharmacist In office.  PCP : Pincus Sanes, MD Patient Care Team: Pincus Sanes, MD as PCP - General (Internal Medicine) Rollene Rotunda, MD as PCP - Cardiology (Cardiology) Lunette Stands, MD as Consulting Physician (Orthopedic Surgery) Rollene Rotunda, MD as Consulting Physician (Cardiology) Venancio Poisson, MD as Consulting Physician (Dermatology) Benjiman Core, MD (Hematology and Oncology) Cleda Mccreedy, MD (Gynecologic Oncology) Carlus Pavlov, MD (Endocrinology) Pyrtle, Carie Caddy, MD as Consulting Physician (Gastroenterology) Kathyrn Sheriff, [facility] as Pharmacist (Pharmacist)  Their chronic conditions include: Hypertension, Hyperlipidemia, Coronary Artery Disease, GERD, Depression, Anxiety, Osteopenia, Osteoarthritis and Overactive Bladder, PVD, IBS, peripheral neuropathy, ITP, prediabetes  Patient lives alone, she lost her husband about 5 years ago. Her daughter lives in town and calls her daily. She is not able to do much outside the house due to back and heel pain.   Office Visits: 04/05/20 Dr Lawerance Bach OV: BP f/u. Worsening palpitations lasting 1-2 hrs  01/20/20 Dr Lawerance Bach OV: restart escitalopram 2.5 mg (previously this increased her BP, but BP is on low side so will monitor effects). Try Voltaren gel for heel pain.   10/02/19 Dr Lawerance Bach OV: referred to ortho and neurology for low back pain, inability to walk straight  Consult Visits: 03/28/20 PA Judy Pimple (cardiology): f/u after ED visit, BP back to normal, continue same meds. Did not increase losartan d/t dizziness in the past on more medication.  03/23/20 ED visit: HTN/dizziness. SBP 199 after increasing prednisone.  12/28/19 Dr Cathey Endow (ophthalmology): f/u for glaucoma 12/25/19 ortho: f/u for tendinitis, Ankle  pain/stiffness 09/28/19 Dr Antoine Poche (cardiology): f/u for CAD, HTN, PVD. Conditions stable. Opted not to treat palpitations as these are not symptomatic. No med changes.   Allergies  Allergen Reactions  . Aspirin Other (See Comments)    ITP  . Sulfa Antibiotics Other (See Comments)    dizziness  . Contrast Media [Iodinated Diagnostic Agents] Hives    CAT scan contrast only  . Lexapro [Escitalopram]     Hypertension  . Fluoxetine     Did not feel well on it   . Celexa [Citalopram] Other (See Comments)    Felt out of it  . Statins Other (See Comments)    Medications: Outpatient Encounter Medications as of 05/25/2020  Medication Sig  . acetaminophen (TYLENOL) 500 MG tablet Take 500 mg by mouth every 6 (six) hours as needed.  . Biotin 2500 MCG CAPS Take 1 capsule by mouth daily.   . calcium carbonate (TUMS - DOSED IN MG ELEMENTAL CALCIUM) 500 MG chewable tablet Chew 1 tablet by mouth daily as needed for indigestion or heartburn.  . Calcium-Magnesium-Vitamin D (CITRACAL CALCIUM+D) 600-40-500 MG-MG-UNIT TB24 Take 1 tablet by mouth daily.   . cycloSPORINE (RESTASIS) 0.05 % ophthalmic emulsion 1 drop 2 (two) times daily.  . dorzolamide-timolol (COSOPT) 22.3-6.8 MG/ML ophthalmic solution Place 1 drop into both eyes 2 (two) times daily.  [name] escitalopram (LEXAPRO) 5 MG tablet 2.5 mg. Patient takes 2.5 mg instead of 5 mg  . famotidine (PEPCID) 20 MG tablet Take 1 tablet (20 mg total) by mouth 2 (two) times daily as needed for heartburn or indigestion.  . Flaxseed MISC Take 5 mLs by mouth daily. Take 1 tsp daily  . furosemide (LASIX) 20 MG tablet Take 0.5 tablets (10 mg  total) by mouth as needed.  . Glucos-Chond-Hyal Ac-Ca Fructo (MOVE FREE JOINT HEALTH ADVANCE PO) Take by mouth.  . losartan (COZAAR) 25 MG tablet Take 1 tablet (25 mg total) by mouth 2 (two) times daily.  . Magnesium Oxide 250 MG TABS Take by mouth.  . metoprolol succinate (TOPROL-XL) 25 MG 24 hr tablet Take 0.5 tablets (12.5 mg  total) by mouth daily. Take with or immediately following a meal.  . Multiple Vitamin (MULTIVITAMIN) tablet Take 1 tablet by mouth daily.    . naproxen sodium (ALEVE) 220 MG tablet Take 220 mg by mouth as needed.   . polyethylene glycol (MIRALAX / GLYCOLAX) packet Take 17 g by mouth daily as needed for moderate constipation.   . predniSONE (DELTASONE) 2.5 MG tablet TAKE 1 TABLET BY MOUTH EVERY OTHER DAY  . Probiotic Product (PROBIOTIC DAILY PO) Take by mouth.  . psyllium (METAMUCIL) 58.6 % packet Take 1 packet by mouth daily as needed.  . trolamine salicylate (ASPERCREME) 10 % cream Apply 1 application topically as needed for muscle pain.   No facility-administered encounter medications on file as of 05/25/2020.    Wt Readings from Last 3 Encounters:  04/08/20 146 lb 14.4 oz (66.6 kg)  04/05/20 147 lb 12.8 oz (67 kg)  03/28/20 146 lb (66.2 kg)    Current Diagnosis/Assessment:    Goals Addressed   None     Hypertension   BP goal is:  <130/80  Office blood pressures are  BP Readings from Last 3 Encounters:  04/08/20 (!) 146/62  04/05/20 (!) 146/70  03/28/20 112/62   Lab Results  Component Value Date   NA 142 03/23/2020   K 4.0 03/23/2020   CO2 29 03/23/2020   BUN 22 03/23/2020   CREATININE 0.87 03/23/2020   CALCIUM 9.7 03/23/2020   GFR 59.20 (L) 11/19/2018   GFRNONAA >60 03/23/2020   GFRAA 74 01/20/2020   Patient checks BP at home twice daily Patient home BP readings are ranging: SBP 115-150 (lower during the day, higher around 11pm right before she takes losartan)  Patient has failed these meds in the past: n/a Patient is currently controlled on the following medications:  . Losartan 25 mg daily . Furosemide 20 mg - 1/2 tab daily  We discussed: benefits of medications; pt reports issues with low BP/ dizziness/lightheadedness but denies falls; pt reports her cardiologist has told her she does not need to take furosemide every day if she is not having swelling;  she is currently taking furosemide daily and denies swelling; advised her to reduce furosemide to every other day and monitor swelling; also advised her to ensure adequate hydration with water/Pedialyte to prevent low BP  Plan  Recommend reduce furosemide 10 mg to every other day Monitor swelling  Hyperlipidemia    LDL goal < 70 Hx PVD - stent to SFA in 2011 Hx CAD - minor, nonobstructive per cath 2012 (40% RCA)  Last lipids Lab Results  Component Value Date   CHOL 196 07/04/2017   HDL 58.40 05/23/2017   LDLCALC 107 (H) 05/23/2017   LDLDIRECT 155.9 03/19/2014   TRIG 193 (H) 06/30/2017   CHOLHDL 3 05/23/2017   Hepatic Function Latest Ref Rng & Units 01/20/2020 11/19/2018 01/28/2018  Total Protein 6.1 - 8.1 g/dL 6.8 7.8 7.4  Albumin 3.5 - 5.2 g/dL - 4.6 4.2  AST 10 - 35 U/L 19 18 15   ALT 6 - 29 U/L 14 12 10   Alk Phosphatase 39 - 117 U/L - 77  73  Total Bilirubin 0.2 - 1.2 mg/dL 0.3 0.4 0.3  Bilirubin, Direct 0.0 - 0.3 mg/dL - - -     The ASCVD Risk score Denman George DC Jr., et al., 2013) failed to calculate for the following reasons:   The 2013 ASCVD risk score is only valid for ages 89 to 54   Patient has failed these meds in past: pravastatin Patient is currently uncontrolled on the following medications:  . No medication  We discussed:  diet and exercise extensively; pt typically eats 1-2 meals per day; she is unable to exercise due to back/foot pain; pt had myalgias with pravastatin and is not interested in trying a different statin; ezetimibe may be an option, however pt needs a new lipid panel to aid in decision making. Advised her to f/u with PCP as scheduled  Plan  Continue control with diet and exercise  May consider ezetimibe if LDL is above goal at next check  Depression   Depression screen [facility] 2/9 04/05/2020 02/24/2019 12/11/2017  Decreased Interest 0 0 2  Down, Depressed, Hopeless 0 1 3  PHQ - 2 Score 0 1 5  Altered sleeping - 1 0  Tired, decreased energy - 1 2   Change in appetite - 0 1  Feeling bad or failure about yourself  - 0 3  Trouble concentrating - 0 1  Moving slowly or fidgety/restless - 0 0  Suicidal thoughts - 0 1  PHQ-9 Score - 3 13  Difficult doing work/chores - Not difficult at all Not difficult at all  Some recent data might be hidden   Patient has failed these meds in past: alprazolam, fluoxetine, citalopram,  Patient is currently uncontrolled on the following medications:  . Escitalopram 5 mg - 1/2 tab daily (not taking)  We discussed:  Pt stopped escitalopram after 2 days due to feeling "off"; she reports mood is unchanged and does report feeling down, depressed or anxious often. Pt is worried escitalopram will cause dizziness; pt to reduce furosemide over the next month, see if dizziness improves; may consider retrying escitalopram if able; discussed 4-8 week trial for maximum benefit.  Discussed benefits of sleep hygiene, melatonin for sleep. Pt typically sleeps 8 hours but she goes to bed at 1:30-2am and wakes up at 10-11am, and she would like to change this pattern.  Plan  Continue to monitor symptoms; consider re-trial of escitalopram if dizziness improves with reduced frequency of furosemide Recommend melatonin 2 mg HS  GERD   Patient has failed these meds in past: n/a Patient is currently controlled on the following medications:  . Famotidine 20 mg BID prn . Tums PRN  We discussed:  Patient is satisfied with current regimen and denies issues  Plan  Continue current medications  IBS   Patient has failed these meds in past: n/a Patient is currently controlled on the following medications:  [name] Miralax PRN . Probiotic . Metamucil PRN . Magnesium oxide 250 mg  We discussed:  Patient is satisfied with current regimen and denies issues  Plan  Continue current medications  Osteoarthritis / Back pain   Patient has failed these meds in past: n/a Patient is currently controlled on the following medications:   [name] Glucosamine-chondroitin (Move Free [employer]) . Tylenol 500 mg q6h PRN . Naproxen 220 mg PRN . Voltaren gel BID (heel pain) . Aspercreme PRN  We discussed:  Pt reports movement/walking makes back pain worse; she can typically control pain with Tylenol but is worried about the effect  on the liver; discussed maximum dose of tylenol is 3000 mg/day; pt reports Voltaren has been helpful for heel pain, she believes pain may be related to her sneakers; advised her to f/u with podiatrist  Plan  Continue current medications  Recommend f/u with podiatrist for heel pain  Osteopenia   Last DEXA Scan: 04/07/2019   T-Score femoral neck: -2.1 (up 2.7%)  T-Score total hip: n/a  T-Score lumbar spine: +0.4 (up 0.1%)  T-Score forearm radius: n/a  10-year probability of major osteoporotic fracture: 14.7%  10-year probability of hip fracture: 4.9%  Last vitamin D Lab Results  Component Value Date   VD25OH 44 07/08/2014   Patient is a candidate for pharmacologic treatment due to T-Score -1.0 to -2.5 and 10-year risk of hip fracture > 3%  Pt has declined treatment previously.  Patient has failed these meds in past: n/a Patient is currently controlled on the following medications:  . Calcium-Mg-Vitamin D  We discussed:  Recommend [phone] units of vitamin D daily. Recommend 1200 mg of calcium daily from dietary and supplemental sources.  Plan  Continue current medications  Immune thrombocytopenic purpura (ITP)   Patient has failed these meds in past: n/a Patient is currently controlled on the following medications:  . Prednisone 2.5 mg QOD  We discussed:  Discussed low dose prednisone is unlikely to cause significant side effects; may be contributing slightly to high BP or swelling  Plan  Continue current medications  Glaucoma   Patient has failed these meds in past: n/a Patient is currently controlled on the following medications:  [name] Dorzolamide-Timolol eye drops . Restasis  eye drops  We discussed:  Patient is satisfied with current regimen and denies issues; Restasis is the only medication that is expensive ~$50/3 months  Plan  Continue current medications  Health Maintenance   Patient is currently controlled on the following medications:  . Biotin 2500 mcg daily . Flaxseed . Multivitamin  We discussed:  Patient is satisfied with current regimen and denies issues  Plan  Continue current medications  Medication Management   Pt uses [facility] for all medications Uses pill box? Yes Pt endorses 100% compliance  We discussed: Verbal consent obtained for [facility] enhanced pharmacy services (medication synchronization, adherence packaging, delivery coordination). A medication sync plan was created to allow patient to get all medications delivered once every 30 to 90 days per patient preference. Patient understands they have freedom to choose pharmacy and clinical pharmacist will coordinate care between all prescribers and [facility].   Plan  Utilize [facility] for medication synchronization, packaging and delivery   Follow up: 1 month phone visit  Al Corpus, PharmD, [facility] Clinical Pharmacist [facility] [phone]

## 2020-05-26 ENCOUNTER — Other Ambulatory Visit: Payer: Self-pay | Admitting: Internal Medicine

## 2020-06-08 ENCOUNTER — Other Ambulatory Visit: Payer: Self-pay | Admitting: Internal Medicine

## 2020-06-14 ENCOUNTER — Ambulatory Visit (INDEPENDENT_AMBULATORY_CARE_PROVIDER_SITE_OTHER): Payer: Medicare HMO | Admitting: Podiatry

## 2020-06-14 ENCOUNTER — Other Ambulatory Visit: Payer: Self-pay

## 2020-06-14 ENCOUNTER — Encounter: Payer: Self-pay | Admitting: Podiatry

## 2020-06-14 DIAGNOSIS — L84 Corns and callosities: Secondary | ICD-10-CM

## 2020-06-14 DIAGNOSIS — B351 Tinea unguium: Secondary | ICD-10-CM

## 2020-06-14 DIAGNOSIS — M79674 Pain in right toe(s): Secondary | ICD-10-CM

## 2020-06-14 DIAGNOSIS — M79675 Pain in left toe(s): Secondary | ICD-10-CM | POA: Diagnosis not present

## 2020-06-14 DIAGNOSIS — I739 Peripheral vascular disease, unspecified: Secondary | ICD-10-CM | POA: Diagnosis not present

## 2020-06-19 NOTE — Progress Notes (Signed)
Subjective: Tamara Robertson presents today for follow up of for at risk foot care. Patient has h/o PAD with claudication. She has h/o placement of SFA stent.  She is see for painful mycotic nails b/l that are difficult to trim. Pain interferes with ambulation. Aggravating factors include wearing enclosed shoe gear. Pain is relieved with periodic professional debridement.   She voices no new pedal problems on today's visit.  PCP is Dr. Billey Gosling. Last visit was 04/05/2020.  Allergies  Allergen Reactions  . Aspirin Other (See Comments)    ITP  . Sulfa Antibiotics Other (See Comments)    dizziness  . Contrast Media [Iodinated Diagnostic Agents] Hives    CAT scan contrast only  . Lexapro [Escitalopram]     Hypertension  . Fluoxetine     Did not feel well on it   . Celexa [Citalopram] Other (See Comments)    Felt out of it  . Statins Other (See Comments)     Objective: There were no vitals filed for this visit.  Pt 87 y.o. year old Caucasian female  in NAD. AAO x 3.   Vascular Examination:  Capillary refill time to digits immediate b/l. Faintly palpable DP pulses b/l. Faintly palpable PT pulses b/l. Pedal hair present b/l. Skin temperature gradient within normal limits b/l.  Dermatological Examination: Pedal skin with normal turgor, texture and tone bilaterally. No open wounds bilaterally. No interdigital macerations bilaterally. Toenails 1-5 b/l elongated, dystrophic, thickened, crumbly with subungual debris and tenderness to dorsal palpation. Hyperkeratotic lesion(s) R hallux and R 2nd toe.  No erythema, no edema, no drainage, no flocculence.  Musculoskeletal: Normal muscle strength 5/5 to all lower extremity muscle groups bilaterally, no pain crepitus or joint limitation noted with ROM b/l, bunion deformity noted b/l and hammertoes noted to the  R 2nd toe.  Neurological: Protective sensation intact 5/5 intact bilaterally with 10g monofilament b/l Vibratory sensation intact  b/l.  Assessment: 1. Pain due to onychomycosis of toenails of both feet   2. Corns and callosities   3. Peripheral vascular disease with claudication (HCC)    Plan: -Toenails 1-5 b/l were debrided in length and girth with sterile nail nippers and dremel without iatrogenic bleeding.  -Corn(s) R 2nd toe and callus(es) R hallux were pared utilizing sterile scalpel blade without incident. Total number debrided =2. -Patient to report any pedal injuries to medical professional immediately. -Patient to continue soft, supportive shoe gear daily. -Patient/POA to call should there be question/concern in the interim.  Return in about 3 months (around 09/11/2020).

## 2020-06-23 DIAGNOSIS — H9202 Otalgia, left ear: Secondary | ICD-10-CM | POA: Insufficient documentation

## 2020-06-23 DIAGNOSIS — H903 Sensorineural hearing loss, bilateral: Secondary | ICD-10-CM | POA: Diagnosis not present

## 2020-07-19 ENCOUNTER — Telehealth: Payer: Self-pay | Admitting: Pharmacist

## 2020-07-19 NOTE — Progress Notes (Signed)
Chronic Care Management Pharmacy Assistant   Name: Tamara Robertson  MRN: [id number] DOB: 04-21-1930   Reason for Encounter: Hypertension Disease State Call   Conditions to be addressed/monitored: HTN  Recent office visits:  04/05/20 Dr. Lawerance Bach, Internal Medicine 06/14/20 Geralynn Rile, Podiatry  Recent consult visits:  [facility] visits:  None in previous 6 months  Medications: Outpatient Encounter Medications as of 07/19/2020  Medication Sig  . acetaminophen (TYLENOL) 500 MG tablet Take 500 mg by mouth every 6 (six) hours as needed.  . Biotin 2500 MCG CAPS Take 1 capsule by mouth daily.   . calcium carbonate (TUMS - DOSED IN MG ELEMENTAL CALCIUM) 500 MG chewable tablet Chew 1 tablet by mouth daily as needed for indigestion or heartburn.  . Calcium-Magnesium-Vitamin D (CITRACAL CALCIUM+D) 600-40-500 MG-MG-UNIT TB24 Take 1 tablet by mouth daily.   . celecoxib (CELEBREX) 200 MG capsule Take 200 mg by mouth daily.  . cycloSPORINE (RESTASIS) 0.05 % ophthalmic emulsion 1 drop 2 (two) times daily.  . dorzolamide-timolol (COSOPT) 22.3-6.8 MG/ML ophthalmic solution Place 1 drop into both eyes 2 (two) times daily.  [name] escitalopram (LEXAPRO) 5 MG tablet TAKE ONE TABLET BY MOUTH ONE TIME DAILY  . famotidine (PEPCID) 20 MG tablet Take 1 tablet (20 mg total) by mouth 2 (two) times daily as needed for heartburn or indigestion.  . Flaxseed MISC Take 5 mLs by mouth daily. Take 1 tsp daily  . furosemide (LASIX) 20 MG tablet Take 0.5 tablets (10 mg total) by mouth as needed.  . Glucos-Chond-Hyal Ac-Ca Fructo (MOVE FREE JOINT HEALTH ADVANCE PO) Take by mouth.  . losartan (COZAAR) 25 MG tablet Take 1 tablet (25 mg total) by mouth 2 (two) times daily.  . Magnesium Oxide 250 MG TABS Take by mouth.  . metoprolol succinate (TOPROL-XL) 25 MG 24 hr tablet Take 0.5 tablets (12.5 mg total) by mouth daily. Take with or immediately following a meal.  . Multiple Vitamin (MULTIVITAMIN) tablet Take 1  tablet by mouth daily.    . naproxen sodium (ALEVE) 220 MG tablet Take 220 mg by mouth as needed.   . polyethylene glycol (MIRALAX / GLYCOLAX) packet Take 17 g by mouth daily as needed for moderate constipation.   . predniSONE (STERAPRED UNI-PAK 48 TAB) 5 MG (48) TBPK tablet   . Probiotic Product (PROBIOTIC DAILY PO) Take by mouth.  . psyllium (METAMUCIL) 58.6 % packet Take 1 packet by mouth daily as needed.  . trolamine salicylate (ASPERCREME) 10 % cream Apply 1 application topically as needed for muscle pain.   No facility-administered encounter medications on file as of 07/19/2020.    Reviewed chart prior to disease state call. Spoke with patient regarding BP  Recent Office Vitals: BP Readings from Last 3 Encounters:  04/08/20 (!) 146/62  04/05/20 (!) 146/70  03/28/20 112/62   Pulse Readings from Last 3 Encounters:  04/08/20 72  04/05/20 82  03/28/20 71    Wt Readings from Last 3 Encounters:  04/08/20 146 lb 14.4 oz (66.6 kg)  04/05/20 147 lb 12.8 oz (67 kg)  03/28/20 146 lb (66.2 kg)     Kidney Function Lab Results  Component Value Date/Time   CREATININE 0.87 03/23/2020 03:21 AM   CREATININE 0.82 01/20/2020 03:25 PM   CREATININE 0.90 11/19/2018 03:34 PM   CREATININE 0.8 02/16/2016 03:26 PM   CREATININE 0.8 07/01/2015 02:24 PM   GFR 59.20 (L) 11/19/2018 03:34 PM   GFRNONAA >60 03/23/2020 03:21 AM   GFRNONAA 64  01/20/2020 03:25 PM   GFRAA 74 01/20/2020 03:25 PM    BMP Latest Ref Rng & Units 03/23/2020 01/20/2020 11/19/2018  Glucose 70 - 99 mg/dL 90 95 99  BUN 8 - 23 mg/dL 22 23 19   Creatinine 0.44 - 1.00 mg/dL 1.61 0.96 0.45  BUN/Creat Ratio 6 - 22 (calc) - NOT APPLICABLE -  Sodium 135 - 145 mmol/L 142 141 140  Potassium 3.5 - 5.1 mmol/L 4.0 4.6 4.6  Chloride 98 - 111 mmol/L 103 103 102  CO2 22 - 32 mmol/L 29 32 31  Calcium 8.9 - 10.3 mg/dL 9.7 9.2 9.5    . Current antihypertensive regimen: The patient is taking Losartan and furosemide for blood  pressure  . How often are you checking your Blood Pressure? The patient states that she checks her blood pressure about once a month  . Current home BP readings: The patient readings are: . 07/11/20 127/63 . 07/12/20 113/60 5 mins later 109/62 . 07/14/20 144/72 5 mins later 151/71  . What recent interventions/DTPs have been made by any provider to improve Blood Pressure control since last CPP Visit: The patient is to keep a log of blood pressure readings and bring to office  . Any recent hospitalizations or ED visits since last visit with CPP? The patient was seen at the ED for HTN on 03/23/20  . What diet changes have been made to improve Blood Pressure Control? The patient states that she has not made any big changes just watches her salt intake  . What exercise is being done to improve your Blood Pressure Control? The patient does not exercise much because of right foot pain   The patient stated that she would like to have an appointment set up with Dr. Lawerance Bach to discuss medications. Patient stated that is not taking celebrex anymore. I told the patient to call the office and make an appointment to see Dr. Lawerance Bach, also that I would let [facility] Know.   Adherence Review: Is the patient currently on ACE/ARB medication? Losartan Does the patient have >5 day gap between last estimated fill dates? CPP to review   Horald Pollen, CPA [facility] [phone]   Time spent:40

## 2020-07-21 ENCOUNTER — Telehealth: Payer: Self-pay | Admitting: Internal Medicine

## 2020-07-21 DIAGNOSIS — Z9889 Other specified postprocedural states: Secondary | ICD-10-CM | POA: Diagnosis not present

## 2020-07-21 DIAGNOSIS — H16223 Keratoconjunctivitis sicca, not specified as Sjogren's, bilateral: Secondary | ICD-10-CM | POA: Diagnosis not present

## 2020-07-21 DIAGNOSIS — H04123 Dry eye syndrome of bilateral lacrimal glands: Secondary | ICD-10-CM | POA: Diagnosis not present

## 2020-07-21 DIAGNOSIS — H401134 Primary open-angle glaucoma, bilateral, indeterminate stage: Secondary | ICD-10-CM | POA: Diagnosis not present

## 2020-07-21 NOTE — Telephone Encounter (Signed)
LVM for pt to rtn my call to schedule AWV with [facility]. Please schedule AWV if pt calls the office  

## 2020-07-26 ENCOUNTER — Telehealth: Payer: Self-pay | Admitting: *Deleted

## 2020-07-26 NOTE — Telephone Encounter (Signed)
Patient called and scheduled a follow up appt for 5/24 at 3:15 pm

## 2020-07-28 NOTE — Patient Instructions (Addendum)
    Blood work was ordered.      Medications changes include :   none     Please followup in 6 months  

## 2020-07-28 NOTE — Progress Notes (Signed)
Subjective:    Patient ID: Tamara Robertson, female    DOB: [birth year], 87 y.o.   MRN: [id number]  HPI The patient is here for follow up of their chronic medical problems, including htn, CAD, prediabetes, ITP, anxiety/ depression   BP 108/62 - 156/70   Went to ED after taking prednisone and BP was very high 230/120.  Since then BP has been controlled.    She is taking all of her medications as prescribed.     Medications and allergies reviewed with patient and updated if appropriate.  Patient Active Problem List   Diagnosis Date Noted  . Palpitations 04/05/2020  . Educated about COVID-19 virus infection 08/13/2019  . Posterior neck pain 08/03/2019  . Dizziness 08/03/2019  . Other headache syndrome 08/03/2019  . Diarrhea 03/13/2019  . Nonrheumatic aortic valve stenosis 02/26/2019  . Sensorineural hearing loss (SNHL), bilateral 01/30/2019  . Chronic ITP (idiopathic thrombocytopenia) (HCC) 12/01/2018  . Lightheadedness 11/19/2018  . Epistaxis 11/19/2018  . 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 Rash 03/25/2018  . Chronic left-sided headaches 01/28/2018  . Depression 12/18/2017  . SBO (small bowel obstruction) (HCC) 09/20/2017  . Aortic stenosis, moderate 08/22/2017  . CAD (coronary artery disease) 08/21/2017  . Pleural effusion on right   . Streptococcal pneumonia (HCC) 06/26/2017  . Community acquired pneumonia of right lower lobe of lung 06/25/2017  . Current chronic use of systemic steroids 06/25/2017  . Chronic lower back pain 05/14/2017  . Varicose veins of left lower extremity with complications 03/05/2017  . Bilateral hearing loss 02/01/2017  . Hypertension 07/31/2016  . Leg edema 07/31/2016  . Prediabetes 06/05/2016  . Osteopenia 12/01/2015  . Anxiety and depression 07/19/2015  . Degenerative cervical disc 12/28/2014  . Left ovarian cyst   . Peripheral neuropathy 06/03/2012  . PVD (peripheral vascular disease) (HCC) 11/23/2010  . Bursitis of hip  10/26/2010  . Hyperlipidemia 06/27/2010  . IMMUNE THROMBOCYTOPENIC PURPURA 06/27/2010  . Unspecified glaucoma 06/27/2010  . GERD 06/27/2010  . Irritable bowel syndrome 06/27/2010  . OVERACTIVE BLADDER 06/27/2010  . OSTEOARTHRITIS, KNEE, RIGHT 06/27/2010  . URINARY INCONTINENCE 06/27/2010    Current Outpatient Medications on File Prior to Visit  Medication Sig Dispense Refill  . acetaminophen (TYLENOL) 500 MG tablet Take 500 mg by mouth every 6 (six) hours as needed.    . Biotin 2500 MCG CAPS Take 1 capsule by mouth daily.     . calcium carbonate (TUMS - DOSED IN MG ELEMENTAL CALCIUM) 500 MG chewable tablet Chew 1 tablet by mouth daily as needed for indigestion or heartburn.    . Calcium-Magnesium-Vitamin D 600-40-500 MG-MG-UNIT TB24 Take 1 tablet by mouth daily.    . cycloSPORINE (RESTASIS) 0.05 % ophthalmic emulsion 1 drop 2 (two) times daily.    [name] escitalopram (LEXAPRO) 5 MG tablet TAKE ONE TABLET BY MOUTH ONE TIME DAILY (Patient taking differently: Patient takes 1/2 tablet daily) 30 tablet 0  . famotidine (PEPCID) 20 MG tablet Take 1 tablet (20 mg total) by mouth 2 (two) times daily as needed for heartburn or indigestion. 60 tablet 3  . Flaxseed MISC Take 5 mLs by mouth daily. Take 1 tsp daily    . furosemide (LASIX) 20 MG tablet Take 0.5 tablets (10 mg total) by mouth as needed. 45 tablet 2  . Glucos-Chond-Hyal Ac-Ca Fructo (MOVE FREE JOINT HEALTH ADVANCE PO) Take by mouth.    . losartan (COZAAR) 25 MG tablet Take 1 tablet (25 mg total) by mouth 2 (two) times daily.  180 tablet 3  . Magnesium Oxide 250 MG TABS Take by mouth.    . metoprolol succinate (TOPROL-XL) 25 MG 24 hr tablet Take 0.5 tablets (12.5 mg total) by mouth daily. Take with or immediately following a meal. 45 tablet 2  . Multiple Vitamin (MULTIVITAMIN) tablet Take 1 tablet by mouth daily.      . naproxen sodium (ALEVE) 220 MG tablet Take 220 mg by mouth as needed.     . polyethylene glycol (MIRALAX / GLYCOLAX) packet  Take 17 g by mouth daily as needed for moderate constipation.     [name] PREDNISONE PO Take 5 mg by mouth. Every other day    . Probiotic Product (PROBIOTIC DAILY PO) Take by mouth.    . psyllium (METAMUCIL) 58.6 % packet Take 1 packet by mouth daily as needed.    . trolamine salicylate (ASPERCREME) 10 % cream Apply 1 application topically as needed for muscle pain.     No current facility-administered medications on file prior to visit.    Past Medical History:  Diagnosis Date  . Anxiety   . CAD (coronary artery disease)    RCA 40% stenosis cath 01/2011  . Cholelithiasis    s/p lap chole 09/2014  . Diverticular stricture [facility]) 2006   [facility]  . DIVERTICULITIS, HX OF   . Diverticulosis   . DYSLIPIDEMIA   . Elevated LFTs   . GERD   . Glaucoma   . Hepatic steatosis   . HOH (hard of hearing)   . Immune thrombocytopenic purpura (HCC)    chronic - baseline 80-100K, on pred  . Irritable bowel syndrome   . Left ovarian cyst dx 01/2013 CT   working with gyn, ?malignant - elevated tumor marker OVA1  . OSTEOARTHRITIS, KNEE, RIGHT   . OSTEOPENIA   . OVERACTIVE BLADDER   . Prolapse of female bladder, acquired 11/2017  . UNSPECIFIED PERIPHERAL VASCULAR DISEASE   . URINARY INCONTINENCE     Past Surgical History:  Procedure Laterality Date  . ABDOMINAL HYSTERECTOMY  1963  . ANGIOPLASTY    . APPENDECTOMY  1956  . CARDIAC CATHETERIZATION    . CATARACT EXTRACTION, BILATERAL  10/2010  . CHOLECYSTECTOMY N/A 09/16/2014   Procedure: LAPAROSCOPIC CHOLECYSTECTOMY ;  Surgeon: Emelia Loron, MD;  Location: [facility] OR;  Service: General;  Laterality: N/A;  . KNEE ARTHROSCOPY Right   . L pop PTA  10/2009   stent  . LAPAROSCOPIC SIGMOID COLECTOMY  10/2005  . SPLENECTOMY  1954  . VARICOSE VEIN SURGERY Right 1962    Social History   Socioeconomic History  . Marital status: Widowed    Spouse name: Not on file  . Number of children: 2  . Years of education: Not on file  .  Highest education level: Not on file  Occupational History  . Occupation: Retired    Comment: Programmer, multimedia  Tobacco Use  . Smoking status: Never Smoker  . Smokeless tobacco: Never Used  Vaping Use  . Vaping Use: Never used  Substance and Sexual Activity  . Alcohol use: No    Alcohol/week: 0.0 standard drinks    Comment: rarely  . Drug use: No  . Sexual activity: Not Currently    Comment: 1st intercourse- 19, partners- 2,  widow  Other Topics Concern  . 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    [place] to [place] from [place] 05/2010 to be  close to kids   Social Determinants of Health   Financial Resource Strain: Low Risk   . Difficulty of Paying Living Expenses: Not very hard  Food Insecurity: Not on file  Transportation Needs: Not on file  Physical Activity: Not on file  Stress: Not on file  Social Connections: Not on file    Family History  Problem Relation Age of Onset  . Coronary artery disease Mother   . Heart attack Mother 54  . Hyperlipidemia Mother   . Hypertension Mother   . Stomach cancer Father   . Hypertension Daughter   . Hyperlipidemia Daughter   . Arthritis Other        parent  . Transient ischemic attack Other        parent  . Colon cancer Neg Hx     Review of Systems  Constitutional: Negative for fever.  Respiratory: Negative for cough, shortness of breath and wheezing.   Cardiovascular: Positive for leg swelling (intermittent). Negative for chest pain and palpitations.  Neurological: Positive for light-headedness (occ) and headaches.       Objective:   Vitals:   07/29/20 1439  BP: 138/70  Pulse: 84  Temp: 98.1 F (36.7 C)  SpO2: 98%   BP Readings from Last 3 Encounters:  07/29/20 138/70  04/08/20 (!) 146/62  04/05/20 (!) 146/70   Wt Readings from Last 3 Encounters:  07/29/20 149 lb (67.6 kg)  04/08/20 146 lb 14.4 oz (66.6 kg)  04/05/20 147 lb 12.8 oz (67 kg)    Body mass index is 28.15 kg/m.   Physical Exam    Constitutional: Appears well-developed and well-nourished. No distress.  HENT:  Head: Normocephalic and atraumatic.  Neck: Neck supple. No tracheal deviation present. No thyromegaly present.  No cervical lymphadenopathy Cardiovascular: Normal rate, regular rhythm and normal heart sounds.   2/6 sys murmur heard. No carotid bruit .  No edema Pulmonary/Chest: Effort normal and breath sounds normal. No respiratory distress. No has no wheezes. No rales.  Skin: Skin is warm and dry. Not diaphoretic.  Psychiatric: Normal mood and affect. Behavior is normal.      Assessment & Plan:    See Problem List for Assessment and Plan of chronic medical problems.    This visit occurred during the SARS-CoV-2 public health emergency.  Safety protocols were in place, including screening questions prior to the visit, additional usage of staff PPE, and extensive cleaning of exam room while observing appropriate contact time as indicated for disinfecting solutions.

## 2020-07-29 ENCOUNTER — Ambulatory Visit: Payer: Medicare HMO | Admitting: Neurology

## 2020-07-29 ENCOUNTER — Ambulatory Visit (INDEPENDENT_AMBULATORY_CARE_PROVIDER_SITE_OTHER): Payer: Medicare HMO | Admitting: Internal Medicine

## 2020-07-29 ENCOUNTER — Other Ambulatory Visit: Payer: Self-pay

## 2020-07-29 ENCOUNTER — Encounter: Payer: Self-pay | Admitting: Internal Medicine

## 2020-07-29 VITALS — BP 138/70 | HR 84 | Temp 98.1°F | Ht 61.0 in | Wt 149.0 lb

## 2020-07-29 DIAGNOSIS — R7303 Prediabetes: Secondary | ICD-10-CM

## 2020-07-29 DIAGNOSIS — M8589 Other specified disorders of bone density and structure, multiple sites: Secondary | ICD-10-CM | POA: Diagnosis not present

## 2020-07-29 DIAGNOSIS — I10 Essential (primary) hypertension: Secondary | ICD-10-CM | POA: Diagnosis not present

## 2020-07-29 DIAGNOSIS — F419 Anxiety disorder, unspecified: Secondary | ICD-10-CM

## 2020-07-29 DIAGNOSIS — D693 Immune thrombocytopenic purpura: Secondary | ICD-10-CM

## 2020-07-29 DIAGNOSIS — F32A Depression, unspecified: Secondary | ICD-10-CM

## 2020-07-29 DIAGNOSIS — I2510 Atherosclerotic heart disease of native coronary artery without angina pectoris: Secondary | ICD-10-CM | POA: Diagnosis not present

## 2020-07-29 DIAGNOSIS — R69 Illness, unspecified: Secondary | ICD-10-CM | POA: Diagnosis not present

## 2020-07-29 LAB — COMPREHENSIVE METABOLIC PANEL
ALT: 13 U/L (ref 0–35)
AST: 19 U/L (ref 0–37)
Albumin: 4.5 g/dL (ref 3.5–5.2)
Alkaline Phosphatase: 78 U/L (ref 39–117)
BUN: 17 mg/dL (ref 6–23)
CO2: 33 mEq/L — ABNORMAL HIGH (ref 19–32)
Calcium: 10 mg/dL (ref 8.4–10.5)
Chloride: 102 mEq/L (ref 96–112)
Creatinine, Ser: 0.8 mg/dL (ref 0.40–1.20)
GFR: 65.57 mL/min (ref >60.00)
Glucose, Bld: 98 mg/dL (ref 70–99)
Potassium: 4.7 mEq/L (ref 3.5–5.1)
Sodium: 142 mEq/L (ref 135–145)
Total Bilirubin: 0.3 mg/dL (ref 0.2–1.2)
Total Protein: 7.6 g/dL (ref 6.0–8.3)

## 2020-07-29 LAB — CBC WITH DIFFERENTIAL/PLATELET
Basophils Absolute: 0.1 10*3/uL (ref 0.0–0.1)
Basophils Relative: 1 % (ref 0.0–3.0)
Eosinophils Absolute: 0.3 10*3/uL (ref 0.0–0.7)
Eosinophils Relative: 2.7 % (ref 0.0–5.0)
HCT: 40.1 % (ref 36.0–46.0)
Hemoglobin: 13.3 g/dL (ref 12.0–15.0)
Lymphocytes Relative: 37.7 % (ref 12.0–46.0)
Lymphs Abs: 4.3 10*3/uL — ABNORMAL HIGH (ref 0.7–4.0)
MCHC: 33.1 g/dL (ref 30.0–36.0)
MCV: 85.3 fl (ref 78.0–100.0)
Monocytes Absolute: 1.3 10*3/uL — ABNORMAL HIGH (ref 0.1–1.0)
Monocytes Relative: 11.3 % (ref 3.0–12.0)
Neutro Abs: 5.4 10*3/uL (ref 1.4–7.7)
Neutrophils Relative %: 47.3 % (ref 43.0–77.0)
Platelets: 146 10*3/uL — ABNORMAL LOW (ref 150.0–400.0)
RBC: 4.69 Mil/uL (ref 3.87–5.11)
RDW: 13.5 % (ref 11.5–15.5)
WBC: 11.5 10*3/uL — ABNORMAL HIGH (ref 4.0–10.5)

## 2020-07-29 LAB — VITAMIN D 25 HYDROXY (VIT D DEFICIENCY, FRACTURES): VITD: 51.41 ng/mL (ref 30.00–100.00)

## 2020-07-29 LAB — HEMOGLOBIN A1C: Hgb A1c MFr Bld: 5.9 % (ref 4.6–6.5)

## 2020-07-29 NOTE — Assessment & Plan Note (Signed)
Chronic Check a1c Low sugar / carb diet Stressed regular exercise  

## 2020-07-29 NOTE — Assessment & Plan Note (Signed)
Chronic Controlled, stable Continue lexapro 2.5 mg daily  

## 2020-07-29 NOTE — Assessment & Plan Note (Signed)
Chronic BP well controlled Continue losartan 25 mg BID, metoprolol xl 12.5 mg qd,  cmp

## 2020-07-31 NOTE — Assessment & Plan Note (Signed)
Chronic Check vitamin d level dexa up to date Continue calcium and vitamin d

## 2020-07-31 NOTE — Assessment & Plan Note (Signed)
Chronic On prednisone 5 mg daily cbc

## 2020-07-31 NOTE — Assessment & Plan Note (Signed)
Chronic No concerning cp, sob, palps Following with cardiology

## 2020-08-05 ENCOUNTER — Other Ambulatory Visit: Payer: Self-pay

## 2020-08-05 ENCOUNTER — Ambulatory Visit (INDEPENDENT_AMBULATORY_CARE_PROVIDER_SITE_OTHER): Payer: Medicare HMO

## 2020-08-05 VITALS — BP 128/70 | HR 73 | Temp 97.6°F | Ht 61.0 in | Wt 148.8 lb

## 2020-08-05 DIAGNOSIS — Z0000 Encounter for general adult medical examination without abnormal findings: Secondary | ICD-10-CM | POA: Diagnosis not present

## 2020-08-05 NOTE — Progress Notes (Signed)
Subjective:   Tamara Robertson is a 87 y.o. female who presents for Medicare Annual (Subsequent) preventive examination.  Review of Systems    No ROS. Medicare Wellness Visit. Additional risk factors are reflected in social history. Cardiac Risk Factors include: advanced age (>21men, >74 women);dyslipidemia;hypertension;family history of premature cardiovascular disease     Objective:    Today's Vitals   08/05/20 1401 08/05/20 1402  BP: 128/70   Pulse: 73   Temp: 97.6 F (36.4 C)   SpO2: 97%   Weight: 148 lb 12.8 oz (67.5 kg)   Height: 5\' 1"  (1.549 m)   PainSc:  2    Body mass index is 28.12 kg/m.  Advanced Directives 08/05/2020 02/24/2019 01/24/2018 12/11/2017 09/20/2017 09/20/2017 06/26/2017  Does Patient Have a Medical Advance Directive? Yes Yes Yes Yes Yes Yes Yes  Type of Advance Directive Living will;Healthcare Power of [place];Living will Living will [place];Living will [place];Living will Living will;Healthcare Power of [place];Living will  Does patient want to make changes to medical advance directive? No - Patient declined - - - No - Patient declined - No - Patient declined  Copy of [place] in Chart? No - copy requested No - copy requested Yes No - copy requested No - copy requested - No - copy requested  Would patient like information on creating a medical advance directive? - - - - - - -    Current Medications (verified) Outpatient Encounter Medications as of 08/05/2020  Medication Sig  . acetaminophen (TYLENOL) 500 MG tablet Take 500 mg by mouth every 6 (six) hours as needed.  . Biotin 2500 MCG CAPS Take 1 capsule by mouth daily.   . calcium carbonate (TUMS - DOSED IN MG ELEMENTAL CALCIUM) 500 MG chewable tablet Chew 1 tablet by mouth daily as needed for indigestion or heartburn.  . Calcium-Magnesium-Vitamin D [id number] MG-MG-UNIT TB24 Take 1  tablet by mouth daily.  . cycloSPORINE (RESTASIS) 0.05 % ophthalmic emulsion 1 drop 2 (two) times daily.  [name] escitalopram (LEXAPRO) 5 MG tablet TAKE ONE TABLET BY MOUTH ONE TIME DAILY (Patient taking differently: Patient takes 1/2 tablet daily)  . famotidine (PEPCID) 20 MG tablet Take 1 tablet (20 mg total) by mouth 2 (two) times daily as needed for heartburn or indigestion.  . Flaxseed MISC Take 5 mLs by mouth daily. Take 1 tsp daily  . furosemide (LASIX) 20 MG tablet Take 0.5 tablets (10 mg total) by mouth as needed.  . Glucos-Chond-Hyal Ac-Ca Fructo (MOVE FREE JOINT HEALTH ADVANCE PO) Take by mouth.  . losartan (COZAAR) 25 MG tablet Take 1 tablet (25 mg total) by mouth 2 (two) times daily.  . Magnesium Oxide 250 MG TABS Take by mouth.  . metoprolol succinate (TOPROL-XL) 25 MG 24 hr tablet Take 0.5 tablets (12.5 mg total) by mouth daily. Take with or immediately following a meal.  . Multiple Vitamin (MULTIVITAMIN) tablet Take 1 tablet by mouth daily.    . naproxen sodium (ALEVE) 220 MG tablet Take 220 mg by mouth as needed.   . polyethylene glycol (MIRALAX / GLYCOLAX) packet Take 17 g by mouth daily as needed for moderate constipation.   [name] PREDNISONE PO Take 5 mg by mouth. Every other day  . Probiotic Product (PROBIOTIC DAILY PO) Take by mouth.  . psyllium (METAMUCIL) 58.6 % packet Take 1 packet by mouth daily as needed.  . trolamine salicylate (ASPERCREME) 10 %  cream Apply 1 application topically as needed for muscle pain.   No facility-administered encounter medications on file as of 08/05/2020.    Allergies (verified) Aspirin, Sulfa antibiotics, Contrast media [iodinated diagnostic agents], Prednisone, Fluoxetine, Celebrex [celecoxib], Celexa [citalopram], and Statins   History: Past Medical History:  Diagnosis Date  . Anxiety   . CAD (coronary artery disease)    RCA 40% stenosis cath 01/2011  . Cholelithiasis    s/p lap chole 09/2014  . Diverticular stricture [facility]) 2006   [facility]  . DIVERTICULITIS, HX OF   . Diverticulosis   . DYSLIPIDEMIA   . Elevated LFTs   . GERD   . Glaucoma   . Hepatic steatosis   . HOH (hard of hearing)   . Immune thrombocytopenic purpura (HCC)    chronic - baseline 80-100K, on pred  . Irritable bowel syndrome   . Left ovarian cyst dx 01/2013 CT   working with gyn, ?malignant - elevated tumor marker OVA1  . OSTEOARTHRITIS, KNEE, RIGHT   . OSTEOPENIA   . OVERACTIVE BLADDER   . Prolapse of female bladder, acquired 11/2017  . UNSPECIFIED PERIPHERAL VASCULAR DISEASE   . URINARY INCONTINENCE    Past Surgical History:  Procedure Laterality Date  . ABDOMINAL HYSTERECTOMY  1963  . ANGIOPLASTY    . APPENDECTOMY  1956  . CARDIAC CATHETERIZATION    . CATARACT EXTRACTION, BILATERAL  10/2010  . CHOLECYSTECTOMY N/A 09/16/2014   Procedure: LAPAROSCOPIC CHOLECYSTECTOMY ;  Surgeon: Rolm Bookbinder, MD;  Location: [facility];  Service: General;  Laterality: N/A;  . KNEE ARTHROSCOPY Right   . L pop PTA  10/2009   stent  . LAPAROSCOPIC SIGMOID COLECTOMY  10/2005  . SPLENECTOMY  1954  . VARICOSE VEIN SURGERY Right 1962   Family History  Problem Relation Age of Onset  . Coronary artery disease Mother   . Heart attack Mother 1  . Hyperlipidemia Mother   . Hypertension Mother   . Stomach cancer Father   . Hypertension Daughter   . Hyperlipidemia Daughter   . Arthritis Other        parent  . Transient ischemic attack Other        parent  . Colon cancer Neg Hx    Social History   Socioeconomic History  . Marital status: Widowed    Spouse name: Not on file  . Number of children: 2  . Years of education: Not on file  . Highest education level: Not on file  Occupational History  . Occupation: Retired    Comment: Retail banker  Tobacco Use  . Smoking status: Never Smoker  . Smokeless tobacco: Never Used  Vaping Use  . Vaping Use: Never used  Substance and Sexual Activity  . Alcohol use: No     Alcohol/week: 0.0 standard drinks    Comment: rarely  . Drug use: No  . Sexual activity: Not Currently    Comment: 1st intercourse- 19, partners- 2,  widow  Other Topics Concern  . 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    Tamara Robertson from [place] 05/2010 to be close to kids   Social Determinants of Health   Financial Resource Strain: Low Risk   . Difficulty of Paying Living Expenses: Not hard at all  Food Insecurity: No Food Insecurity  . Worried About Charity fundraiser in the Last Year: Never true  . Ran  Out of Food in the Last Year: Never true  Transportation Needs: No Transportation Needs  . Lack of Transportation (Medical): No  . Lack of Transportation (Non-Medical): No  Physical Activity: Inactive  . Days of Exercise per Week: 0 days  . Minutes of Exercise per Session: 0 min  Stress: No Stress Concern Present  . Feeling of Stress : Not at all  Social Connections: Moderately Integrated  . Frequency of Communication with Friends and Family: More than three times a week  . Frequency of Social Gatherings with Friends and Family: Once a week  . Attends Religious Services: More than 4 times per year  . Active Member of Clubs or Organizations: Yes  . Attends Archivist Meetings: More than 4 times per year  . Marital Status: Widowed    Tobacco Counseling Counseling given: Not Answered   Clinical Intake:  Pre-visit preparation completed: Yes  Pain : Faces Pain Score: 2  Faces Pain Scale: Hurts a little bit Pain Location: Heel Pain Orientation: Right Pain Radiating Towards: none Pain Descriptors / Indicators: Aching Pain Onset: More than a month ago Pain Frequency: Constant (when wearing a shoe) Pain Relieving Factors: cortisone injection Effect of Pain on Daily Activities: Pain produces disability and affects the quality of life.  Faces Pain Scale: Hurts a little bit Pain Relieving Factors:  cortisone injection  BMI - recorded: 28.12 Nutritional Status: BMI 25 -29 Overweight Nutritional Risks: None Diabetes: No  How often do you need to have someone help you when you read instructions, pamphlets, or other written materials from your doctor or pharmacy?: 1 - Never What is the last grade level you completed in school?: High School Graduate  Diabetic? no  Interpreter Needed?: No  Information entered by :: Lisette Abu, LPN   Activities of Daily Living In your present state of health, do you have any difficulty performing the following activities: 08/05/2020 04/05/2020  Hearing? N N  Vision? N N  Difficulty concentrating or making decisions? N N  Walking or climbing stairs? N N  Dressing or bathing? N N  Doing errands, shopping? N N  Preparing Food and eating ? N -  Using the Toilet? N -  In the past six months, have you accidently leaked urine? N -  Do you have problems with loss of bowel control? N -  Managing your Medications? N -  Managing your Finances? N -  Housekeeping or managing your Housekeeping? N -  Some recent data might be hidden    Patient Care Team: Binnie Rail, MD as PCP - General (Internal Medicine) [name], MD as PCP - Cardiology (Cardiology) Almedia Balls, MD as Consulting Physician (Orthopedic Surgery) [name], MD as Consulting Physician (Cardiology) Rolm Bookbinder, MD as Consulting Physician (Dermatology) Wyatt Portela, MD (Hematology and Oncology) Nancy Marus, MD (Gynecologic Oncology) Philemon Kingdom, MD (Endocrinology) Pyrtle, Lajuan Lines, MD as Consulting Physician (Gastroenterology) Charlton Haws, [facility] as Pharmacist (Pharmacist)  Indicate any recent Medical Services you may have received from other than [facility] providers in the past year (date may be approximate).     Assessment:   This is a routine wellness examination for Areana.  Hearing/Vision screen No exam data present  Dietary issues and exercise  activities discussed: Current Exercise Habits: The patient does not participate in regular exercise at present, Exercise limited by: orthopedic condition(s)  Goals    . Patient Stated     Increase my physical activity by walking and doing chair exercises during  commercials when I am watching TV. Continue to volunteer and stay active in church.    . Pharmacy Care Plan     CARE PLAN ENTRY (see longitudinal plan of care for additional care plan information)  Current Barriers:  . Chronic Disease Management support, education, and care coordination needs related to Hypertension, Hyperlipidemia, Coronary Artery Disease, and Depression/Anxiety   Hypertension BP Readings from Last 3 Encounters:  01/20/20 112/68  10/02/19 (!) 162/70  09/28/19 (!) 170/72 .  Pharmacist Clinical Goal(s): o Over the next 30 days, patient will work with PharmD and providers to maintain BP goal <130/80 . Current regimen:  o Losartan 25 mg daily o Furosemide 20 mg - 1/2 tab as needed . Interventions: o Discussed BP goals and benefits of medications for prevention of heart attack / stroke o Recommend to reduce furosemide to every other day to prevent low BP . Patient self care activities - Over the next 30 days, patient will: o Check BP daily, document, and provide at future appointments o Ensure daily salt intake < 2300 mg/day o Reduce furosemide to every other day and monitor swelling  Hyperlipidemia Lab Results  Component Value Date/Time   LDLCALC 107 (H) 05/23/2017 11:51 AM   LDLCALC 132 (H) 06/22/2014 08:25 AM   LDLDIRECT 155.9 03/19/2014 08:28 AM .  Pharmacist Clinical Goal(s): o Over the next 90 days, patient will work with PharmD and providers to achieve LDL goal < 70 . Current regimen:  o No medications . Interventions: o Discussed cholesterol goals and benefits of medications for prevention of heart attack / stroke o Discussed benefits of ezetimibe; if LDL is elevated at next check may consider  adding ezetimibe . Patient self care activities - Over the next 90 days, patient will: o Follow up with PCP as scheduled  Depression / Anxiety . Pharmacist Clinical Goal(s) o Over the next 30 days, patient will work with PharmD and providers to optimize therapy . Current regimen:  o No medications . Interventions: o Discussed possibility of restarting escitalopram 2.5 mg once BP has stabilized; advised it may take 4-8 weeks for maximum benefit o Discussed benefits of melatonin for improving sleep cycle/hygiene . Patient self care activities - Over the next 30 days, patient will: o Consider restarting escitalopram when Bp/dizziness stabilizes o Try melatonin 2 mg at bedtime  Medication management . Pharmacist Clinical Goal(s): o Over the next 90 days, patient will work with PharmD and providers to achieve optimal medication adherence . Current pharmacy: [facility] . Interventions o Comprehensive medication review performed. o Utilize [facility] pharmacy for medication synchronization, packaging and delivery . Patient self care activities - Over the next 90 days, patient will: o Focus on medication adherence by pill box o Take medications as prescribed o Report any questions or concerns to PharmD and/or provider(s)  Initial goal documentation    . Stay as active and as independent as possible      Depression Screen PHQ 2/9 Scores 08/05/2020 04/05/2020 02/24/2019 12/11/2017 12/11/2016 12/07/2016 12/04/2016  PHQ - 2 Score 0 0 1 5 1 2 1   PHQ- 9 Score - - 3 13 3 6 3     Fall Risk Fall Risk  08/05/2020 04/05/2020 02/24/2019 05/16/2018 02/18/2018  Falls in the past year? 1 0 0 0 No  Number falls in past yr: 1 0 0 0 -  Injury with Fall? 1 0 0 0 -  Risk Factor Category  - - - - -  Risk for fall due to : History of  fall(s);Impaired balance/gait;Orthopedic patient No Fall Risks Impaired balance/gait - -  Risk for fall due to: Comment - - feels light headed at times - -  Follow up Falls evaluation  completed Falls evaluation completed - Falls evaluation completed -    FALL RISK PREVENTION PERTAINING TO THE HOME:  Any stairs in or around the home? No  If so, are there any without handrails? No  Home free of loose throw rugs in walkways, pet beds, electrical cords, etc? Yes  Adequate lighting in your home to reduce risk of falls? Yes   ASSISTIVE DEVICES UTILIZED TO PREVENT FALLS:  Life alert? Yes  Use of a cane, walker or w/c? Yes  Grab bars in the bathroom? Yes  Shower chair or bench in shower? Yes  Elevated toilet seat or a handicapped toilet? Yes   TIMED UP AND GO:  Was the test performed? No .  Length of time to ambulate 10 feet: 0 sec.   Gait steady and fast without use of assistive device  Cognitive Function: Normal cognitive status assessed by direct observation by this Nurse Health Advisor. No abnormalities found.   MMSE - Mini Mental State Exam 12/11/2017 12/04/2016  Orientation to time 5 5  Orientation to Place 5 5  Registration 3 3  Attention/ Calculation 5 5  Recall 3 1  Language- name 2 objects 2 2  Language- repeat 1 1  Language- follow 3 step command 3 3  Language- read & follow direction 1 1  Write a sentence 1 1  Copy design 1 1  Total score 30 28     6CIT Screen 02/24/2019  What Year? 0 points  What month? 0 points  What time? 0 points  Count back from 20 0 points  Months in reverse 0 points  Repeat phrase 2 points  Total Score 2    Immunizations Immunization History  Administered Date(s) Administered  . Fluad Quad(high Dose 65+) 12/31/2018, 01/20/2020  . Influenza Split 02/04/2014, 02/14/2015  . Influenza-Unspecified 02/12/2013, 02/06/2016, 02/04/2017, 01/10/2018  . PFIZER(Purple Top)SARS-COV-2 Vaccination 06/13/2019, 07/08/2019, 02/17/2020  . Pneumococcal Conjugate-13 08/24/2014  . Pneumococcal Polysaccharide-23 02/28/2011  . Td 02/28/2011  . Zoster 04/07/2007  . Zoster Recombinat (Shingrix) 05/23/2017, 10/25/2017    TDAP status:  Up to date  Flu Vaccine status: Up to date  Pneumococcal vaccine status: Up to date  Covid-19 vaccine status: Completed vaccines  Qualifies for Shingles Vaccine? Yes   Zostavax completed Yes   Shingrix Completed?: Yes  Screening Tests Health Maintenance  Topic Date Due  . INFLUENZA VACCINE  12/05/2020  . TETANUS/TDAP  02/27/2021  . DEXA SCAN  04/14/2022  . COVID-19 Vaccine  Completed  . PNA vac Low Risk Adult  Completed  . HPV VACCINES  Aged Out    Health Maintenance  There are no preventive care reminders to display for this patient.  Colorectal cancer screening: No longer required.   Mammogram status: Completed 12/03/2019. Repeat every year  Bone Density status: Completed 04/15/2019. Results reflect: Bone density results: OSTEOPENIA. Repeat every 3 years.  Lung Cancer Screening: (Low Dose CT Chest recommended if Age 61-80 years, 30 pack-year currently smoking OR have quit w/in 15years.) does not qualify.   Lung Cancer Screening Referral: no  Additional Screening:  Hepatitis C Screening: does not qualify; Completed no  Vision Screening: Recommended annual ophthalmology exams for early detection of glaucoma and other disorders of the eye. Is the patient up to date with their annual eye exam?  Yes  Who is  the provider or what is the name of the office in which the patient attends annual eye exams? Dr. Jola Schmidt If pt is not established with a provider, would they like to be referred to a provider to establish care? No .   Dental Screening: Recommended annual dental exams for proper oral hygiene  Community Resource Referral / Chronic Care Management: CRR required this visit?  No   CCM required this visit?  No      Plan:     I have personally reviewed and noted the following in the patient's chart:   . Medical and social history . Use of alcohol, tobacco or illicit drugs  . Current medications and supplements . Functional ability and status . Nutritional  status . Physical activity . Advanced directives . List of other physicians . Hospitalizations, surgeries, and ER visits in previous 12 months . Vitals . Screenings to include cognitive, depression, and falls . Referrals and appointments  In addition, I have reviewed and discussed with patient certain preventive protocols, quality metrics, and best practice recommendations. A written personalized care plan for preventive services as well as general preventive health recommendations were provided to patient.     Sheral Flow, LPN   07/09/5972   Nurse Notes:  Medications reviewed with patient; no opioid use noted.

## 2020-08-05 NOTE — Patient Instructions (Signed)
Tamara Robertson , Thank you for taking time to come for your Medicare Wellness Visit. I appreciate your ongoing commitment to your health goals. Please review the following plan we discussed and let me know if I can assist you in the future.   Screening recommendations/referrals: Colonoscopy: not a candidate for colon cancer screening due to age 87: 01/20/2020 Bone Density: 04/15/2019; due every 3 years Recommended yearly ophthalmology/optometry visit for glaucoma screening and checkup Recommended yearly dental visit for hygiene and checkup  Vaccinations: Influenza vaccine: 01/20/2020 Pneumococcal vaccine: 02/28/2011, 08/24/2014 Tdap vaccine: 02/28/2011; due every 10 years Shingles vaccine: 05/23/2017, 10/25/2017   Covid-19: 06/13/2019, 07/08/2019, 02/17/2020  Advanced directives: Please bring a copy of your health care power of attorney and living will to the office at your convenience.  Conditions/risks identified: Yes; Reviewed health maintenance screenings with patient today and relevant education, vaccines, and/or referrals were provided. Please continue to do your personal lifestyle choices by: daily care of teeth and gums, regular physical activity (goal should be 5 days a week for 30 minutes), eat a healthy diet, avoid tobacco and drug use, limiting any alcohol intake, taking a low-dose aspirin (if not allergic or have been advised by your provider otherwise) and taking vitamins and minerals as recommended by your provider. Continue doing brain stimulating activities (puzzles, reading, adult coloring books, staying active) to keep memory sharp. Continue to eat heart healthy diet (full of fruits, vegetables, whole grains, lean protein, water--limit salt, fat, and sugar intake) and increase physical activity as tolerated.  Next appointment: Please schedule your next Medicare Wellness Visit with your Nurse Health Advisor in 1 year by calling [phone].   Preventive Care 74 Years and Older,  Female Preventive care refers to lifestyle choices and visits with your health care provider that can promote health and wellness. What does preventive care include?  A yearly physical exam. This is also called an annual well check.  Dental exams once or twice a year.  Routine eye exams. Ask your health care provider how often you should have your eyes checked.  Personal lifestyle choices, including:  Daily care of your teeth and gums.  Regular physical activity.  Eating a healthy diet.  Avoiding tobacco and drug use.  Limiting alcohol use.  Practicing safe sex.  Taking low-dose aspirin every day.  Taking vitamin and mineral supplements as recommended by your health care provider. What happens during an annual well check? The services and screenings done by your health care provider during your annual well check will depend on your age, overall health, lifestyle risk factors, and family history of disease. Counseling  Your health care provider may ask you questions about your:  Alcohol use.  Tobacco use.  Drug use.  Emotional well-being.  Home and relationship well-being.  Sexual activity.  Eating habits.  History of falls.  Memory and ability to understand (cognition).  Work and work Statistician.  Reproductive health. Screening  You may have the following tests or measurements:  Height, weight, and BMI.  Blood pressure.  Lipid and cholesterol levels. These may be checked every 5 years, or more frequently if you are over 24 years old.  Skin check.  Lung cancer screening. You may have this screening every year starting at age 46 if you have a 30-pack-year history of smoking and currently smoke or have quit within the past 15 years.  Fecal occult blood test (FOBT) of the stool. You may have this test every year starting at age 36.  Flexible sigmoidoscopy or  colonoscopy. You may have a sigmoidoscopy every 5 years or a colonoscopy every 10 years  starting at age 56.  Hepatitis C blood test.  Hepatitis B blood test.  Sexually transmitted disease (STD) testing.  Diabetes screening. This is done by checking your blood sugar (glucose) after you have not eaten for a while (fasting). You may have this done every 1-3 years.  Bone density scan. This is done to screen for osteoporosis. You may have this done starting at age 58.  Mammogram. This may be done every 1-2 years. Talk to your health care provider about how often you should have regular mammograms. Talk with your health care provider about your test results, treatment options, and if necessary, the need for more tests. Vaccines  Your health care provider may recommend certain vaccines, such as:  Influenza vaccine. This is recommended every year.  Tetanus, diphtheria, and acellular pertussis (Tdap, Td) vaccine. You may need a Td booster every 10 years.  Zoster vaccine. You may need this after age 4.  Pneumococcal 13-valent conjugate (PCV13) vaccine. One dose is recommended after age 60.  Pneumococcal polysaccharide (PPSV23) vaccine. One dose is recommended after age 49. Talk to your health care provider about which screenings and vaccines you need and how often you need them. This information is not intended to replace advice given to you by your health care provider. Make sure you discuss any questions you have with your health care provider. Document Released: 05/20/2015 Document Revised: 01/11/2016 Document Reviewed: 02/22/2015 Elsevier Interactive Patient Education  2017 [place] Prevention in the Home Falls can cause injuries. They can happen to people of all ages. There are many things you can do to make your home safe and to help prevent falls. What can I do on the outside of my home?  Regularly fix the edges of walkways and driveways and fix any cracks.  Remove anything that might make you trip as you walk through a door, such as a raised step or  threshold.  Trim any bushes or trees on the path to your home.  Use bright outdoor lighting.  Clear any walking paths of anything that might make someone trip, such as rocks or tools.  Regularly check to see if handrails are loose or broken. Make sure that both sides of any steps have handrails.  Any raised decks and porches should have guardrails on the edges.  Have any leaves, snow, or ice cleared regularly.  Use sand or salt on walking paths during winter.  Clean up any spills in your garage right away. This includes oil or grease spills. What can I do in the bathroom?  Use night lights.  Install grab bars by the toilet and in the tub and shower. Do not use towel bars as grab bars.  Use non-skid mats or decals in the tub or shower.  If you need to sit down in the shower, use a plastic, non-slip stool.  Keep the floor dry. Clean up any water that spills on the floor as soon as it happens.  Remove soap buildup in the tub or shower regularly.  Attach bath mats securely with double-sided non-slip rug tape.  Do not have throw rugs and other things on the floor that can make you trip. What can I do in the bedroom?  Use night lights.  Make sure that you have a light by your bed that is easy to reach.  Do not use any sheets or blankets that are too  big for your bed. They should not hang down onto the floor.  Have a firm chair that has side arms. You can use this for support while you get dressed.  Do not have throw rugs and other things on the floor that can make you trip. What can I do in the kitchen?  Clean up any spills right away.  Avoid walking on wet floors.  Keep items that you use a lot in easy-to-reach places.  If you need to reach something above you, use a strong step stool that has a grab bar.  Keep electrical cords out of the way.  Do not use floor polish or wax that makes floors slippery. If you must use wax, use non-skid floor wax.  Do not have  throw rugs and other things on the floor that can make you trip. What can I do with my stairs?  Do not leave any items on the stairs.  Make sure that there are handrails on both sides of the stairs and use them. Fix handrails that are broken or loose. Make sure that handrails are as long as the stairways.  Check any carpeting to make sure that it is firmly attached to the stairs. Fix any carpet that is loose or worn.  Avoid having throw rugs at the top or bottom of the stairs. If you do have throw rugs, attach them to the floor with carpet tape.  Make sure that you have a light switch at the top of the stairs and the bottom of the stairs. If you do not have them, ask someone to add them for you. What else can I do to help prevent falls?  Wear shoes that:  Do not have high heels.  Have rubber bottoms.  Are comfortable and fit you well.  Are closed at the toe. Do not wear sandals.  If you use a stepladder:  Make sure that it is fully opened. Do not climb a closed stepladder.  Make sure that both sides of the stepladder are locked into place.  Ask someone to hold it for you, if possible.  Clearly mark and make sure that you can see:  Any grab bars or handrails.  First and last steps.  Where the edge of each step is.  Use tools that help you move around (mobility aids) if they are needed. These include:  Canes.  Walkers.  Scooters.  Crutches.  Turn on the lights when you go into a dark area. Replace any light bulbs as soon as they burn out.  Set up your furniture so you have a clear path. Avoid moving your furniture around.  If any of your floors are uneven, fix them.  If there are any pets around you, be aware of where they are.  Review your medicines with your doctor. Some medicines can make you feel dizzy. This can increase your chance of falling. Ask your doctor what other things that you can do to help prevent falls. This information is not intended to  replace advice given to you by your health care provider. Make sure you discuss any questions you have with your health care provider. Document Released: 02/17/2009 Document Revised: 09/29/2015 Document Reviewed: 05/28/2014 Elsevier Interactive Patient Education  2017 [employer].

## 2020-08-09 ENCOUNTER — Telehealth: Payer: Medicare HMO

## 2020-08-09 NOTE — Progress Notes (Deleted)
Chronic Care Management Pharmacy Note  08/09/2020 Name:  Tamara Robertson MRN:  [id number] DOB:  Jul 23, 1929  Subjective: Tamara Robertson is an 87 y.o. year old female who is a primary patient of Burns, Claudina Lick, MD.  The CCM team was consulted for assistance with disease management and care coordination needs.    Engaged with patient by telephone for follow up visit in response to provider referral for pharmacy case management and/or care coordination services.   Consent to Services:  The patient was given information about Chronic Care Management services, agreed to services, and gave verbal consent prior to initiation of services.  Please see initial visit note for detailed documentation.   Patient Care Team: Binnie Rail, MD as PCP - General (Internal Medicine) [name], MD as PCP - Cardiology (Cardiology) Almedia Balls, MD as Consulting Physician (Orthopedic Surgery) [name], MD as Consulting Physician (Cardiology) Rolm Bookbinder, MD as Consulting Physician (Dermatology) Wyatt Portela, MD (Hematology and Oncology) Nancy Marus, MD (Gynecologic Oncology) Philemon Kingdom, MD (Endocrinology) Pyrtle, Lajuan Lines, MD as Consulting Physician (Gastroenterology) Charlton Haws, [facility] as Pharmacist (Pharmacist)  Recent office visits: 07/29/20 Dr Quay Burow OV: chronic f/u. Labs stable. No changes.  Recent consult visits: 06/23/20 Dr [facility]e [facility]cal [facility] ear pain 06/14/20 Dr Elisha Ponder (podiatry) 05/17/20 Dr Valetta Close (ophthalmology): f/u dry eye  Hospital visits: 03/23/20 ED visit for hypertensive crisis, though related to higher dose prednisone.  Objective:  Lab Results  Component Value Date   CREATININE 0.80 07/29/2020   BUN 17 07/29/2020   GFR 65.57 07/29/2020   GFRNONAA >60 03/23/2020   GFRAA 74 01/20/2020   NA 142 07/29/2020   K 4.7 07/29/2020   CALCIUM 10.0 07/29/2020   CO2 33 (H) 07/29/2020   GLUCOSE 98 07/29/2020    Lab Results  Component Value Date/Time    HGBA1C 5.9 07/29/2020 03:46 PM   HGBA1C 5.7 (H) 01/20/2020 03:25 PM   GFR 65.57 07/29/2020 03:46 PM   GFR 59.20 (L) 11/19/2018 03:34 PM    Last diabetic Eye exam: No results found for: HMDIABEYEEXA  Last diabetic Foot exam: No results found for: HMDIABFOOTEX   Lab Results  Component Value Date   CHOL 196 07/04/2017   HDL 58.40 05/23/2017   LDLCALC 107 (H) 05/23/2017   LDLDIRECT 155.9 03/19/2014   TRIG 193 (H) 06/30/2017   CHOLHDL 3 05/23/2017    Hepatic Function Latest Ref Rng & Units 07/29/2020 01/20/2020 11/19/2018  Total Protein 6.0 - 8.3 g/dL 7.6 6.8 7.8  Albumin 3.5 - 5.2 g/dL 4.5 - 4.6  AST 0 - 37 U/L _0 ALT 0 - 35 U/L _1 Alk Phosphatase 39 - 117 U/L 78 - 77  Total Bilirubin 0.2 - 1.2 mg/dL 0.3 0.3 0.4  Bilirubin, Direct 0.0 - 0.3 mg/dL - - -    Lab Results  Component Value Date/Time   TSH 2.23 02/11/2019 04:26 PM   TSH 2.05 07/23/2017 01:58 PM    CBC Latest Ref Rng & Units 07/29/2020 03/23/2020 01/20/2020  WBC 4.0 - 10.5 K/uL 11.5(H) 13.7(H) 10.3  Hemoglobin 12.0 - 15.0 g/dL 13.3 14.1 13.5  Hematocrit 36.0 - 46.0 % 40.1 43.5 40.2  Platelets 150.0 - 400.0 K/uL 146.0(L) 227 174    Lab Results  Component Value Date/Time   VD25OH 51.41 07/29/2020 03:46 PM   VD25OH 44 07/08/2014 12:31 PM   VD25OH 61 07/06/2013 12:02 PM    Clinical ASCVD: Yes  The ASCVD Risk score Mikey Bussing  Chronic Care Management Pharmacy Note  08/09/2020 Name:  Tamara Robertson MRN:  [id number] DOB:  1930/03/27  Subjective: Tamara Robertson is an 87 y.o. year old female who is a primary patient of Burns, Claudina Lick, MD.  The CCM team was consulted for assistance with disease management and care coordination needs.    Engaged with patient by telephone for follow up visit in response to provider referral for pharmacy case management and/or care coordination services.   Consent to Services:  The patient was given information about Chronic Care Management services, agreed to services, and gave verbal consent prior to initiation of services.  Please see initial visit note for detailed documentation.   Patient Care Team: Binnie Rail, MD as PCP - General (Internal Medicine) [name], MD as PCP - Cardiology (Cardiology) Almedia Balls, MD as Consulting Physician (Orthopedic Surgery) [name], MD as Consulting Physician (Cardiology) Rolm Bookbinder, MD as Consulting Physician (Dermatology) Wyatt Portela, MD (Hematology and Oncology) Nancy Marus, MD (Gynecologic Oncology) Philemon Kingdom, MD (Endocrinology) Pyrtle, Lajuan Lines, MD as Consulting Physician (Gastroenterology) Charlton Haws, [facility] as Pharmacist (Pharmacist)  Recent office visits: 07/29/20 Dr Quay Burow OV: chronic f/u. Labs stable. No changes.  Recent consult visits: 06/23/20 Dr Sallee [facility]cal [facility] ear pain 06/14/20 Dr Elisha Ponder (podiatry) 05/17/20 Dr Valetta Close (ophthalmology): f/u dry eye  Hospital visits: 03/23/20 ED visit for hypertensive crisis, though related to higher dose prednisone.  Objective:  Lab Results  Component Value Date   CREATININE 0.80 07/29/2020   BUN 17 07/29/2020   GFR 65.57 07/29/2020   GFRNONAA >60 03/23/2020   GFRAA 74 01/20/2020   NA 142 07/29/2020   K 4.7 07/29/2020   CALCIUM 10.0 07/29/2020   CO2 33 (H) 07/29/2020   GLUCOSE 98 07/29/2020    Lab Results  Component Value Date/Time    HGBA1C 5.9 07/29/2020 03:46 PM   HGBA1C 5.7 (H) 01/20/2020 03:25 PM   GFR 65.57 07/29/2020 03:46 PM   GFR 59.20 (L) 11/19/2018 03:34 PM    Last diabetic Eye exam: No results found for: HMDIABEYEEXA  Last diabetic Foot exam: No results found for: HMDIABFOOTEX   Lab Results  Component Value Date   CHOL 196 07/04/2017   HDL 58.40 05/23/2017   LDLCALC 107 (H) 05/23/2017   LDLDIRECT 155.9 03/19/2014   TRIG 193 (H) 06/30/2017   CHOLHDL 3 05/23/2017    Hepatic Function Latest Ref Rng & Units 07/29/2020 01/20/2020 11/19/2018  Total Protein 6.0 - 8.3 g/dL 7.6 6.8 7.8  Albumin 3.5 - 5.2 g/dL 4.5 - 4.6  AST 0 - 37 U/L _0 ALT 0 - 35 U/L _1 Alk Phosphatase 39 - 117 U/L 78 - 77  Total Bilirubin 0.2 - 1.2 mg/dL 0.3 0.3 0.4  Bilirubin, Direct 0.0 - 0.3 mg/dL - - -    Lab Results  Component Value Date/Time   TSH 2.23 02/11/2019 04:26 PM   TSH 2.05 07/23/2017 01:58 PM    CBC Latest Ref Rng & Units 07/29/2020 03/23/2020 01/20/2020  WBC 4.0 - 10.5 K/uL 11.5(H) 13.7(H) 10.3  Hemoglobin 12.0 - 15.0 g/dL 13.3 14.1 13.5  Hematocrit 36.0 - 46.0 % 40.1 43.5 40.2  Platelets 150.0 - 400.0 K/uL 146.0(L) 227 174    Lab Results  Component Value Date/Time   VD25OH 51.41 07/29/2020 03:46 PM   VD25OH 44 07/08/2014 12:31 PM   VD25OH 61 07/06/2013 12:02 PM    Clinical ASCVD: Yes  The ASCVD Risk score Mikey Bussing  Chronic Care Management Pharmacy Note  08/09/2020 Name:  Tamara Robertson MRN:  [id number] DOB:  Jul 23, 1929  Subjective: Tamara Robertson is an 87 y.o. year old female who is a primary patient of Burns, Claudina Lick, MD.  The CCM team was consulted for assistance with disease management and care coordination needs.    Engaged with patient by telephone for follow up visit in response to provider referral for pharmacy case management and/or care coordination services.   Consent to Services:  The patient was given information about Chronic Care Management services, agreed to services, and gave verbal consent prior to initiation of services.  Please see initial visit note for detailed documentation.   Patient Care Team: Binnie Rail, MD as PCP - General (Internal Medicine) [name], MD as PCP - Cardiology (Cardiology) Almedia Balls, MD as Consulting Physician (Orthopedic Surgery) [name], MD as Consulting Physician (Cardiology) Rolm Bookbinder, MD as Consulting Physician (Dermatology) Wyatt Portela, MD (Hematology and Oncology) Nancy Marus, MD (Gynecologic Oncology) Philemon Kingdom, MD (Endocrinology) Pyrtle, Lajuan Lines, MD as Consulting Physician (Gastroenterology) Charlton Haws, [facility] as Pharmacist (Pharmacist)  Recent office visits: 07/29/20 Dr Quay Burow OV: chronic f/u. Labs stable. No changes.  Recent consult visits: 06/23/20 Dr Sallee [facility]cal [facility] ear pain 06/14/20 Dr Elisha Ponder (podiatry) 05/17/20 Dr Valetta Close (ophthalmology): f/u dry eye  Hospital visits: 03/23/20 ED visit for hypertensive crisis, though related to higher dose prednisone.  Objective:  Lab Results  Component Value Date   CREATININE 0.80 07/29/2020   BUN 17 07/29/2020   GFR 65.57 07/29/2020   GFRNONAA >60 03/23/2020   GFRAA 74 01/20/2020   NA 142 07/29/2020   K 4.7 07/29/2020   CALCIUM 10.0 07/29/2020   CO2 33 (H) 07/29/2020   GLUCOSE 98 07/29/2020    Lab Results  Component Value Date/Time    HGBA1C 5.9 07/29/2020 03:46 PM   HGBA1C 5.7 (H) 01/20/2020 03:25 PM   GFR 65.57 07/29/2020 03:46 PM   GFR 59.20 (L) 11/19/2018 03:34 PM    Last diabetic Eye exam: No results found for: HMDIABEYEEXA  Last diabetic Foot exam: No results found for: HMDIABFOOTEX   Lab Results  Component Value Date   CHOL 196 07/04/2017   HDL 58.40 05/23/2017   LDLCALC 107 (H) 05/23/2017   LDLDIRECT 155.9 03/19/2014   TRIG 193 (H) 06/30/2017   CHOLHDL 3 05/23/2017    Hepatic Function Latest Ref Rng & Units 07/29/2020 01/20/2020 11/19/2018  Total Protein 6.0 - 8.3 g/dL 7.6 6.8 7.8  Albumin 3.5 - 5.2 g/dL 4.5 - 4.6  AST 0 - 37 U/L _0 ALT 0 - 35 U/L _1 Alk Phosphatase 39 - 117 U/L 78 - 77  Total Bilirubin 0.2 - 1.2 mg/dL 0.3 0.3 0.4  Bilirubin, Direct 0.0 - 0.3 mg/dL - - -    Lab Results  Component Value Date/Time   TSH 2.23 02/11/2019 04:26 PM   TSH 2.05 07/23/2017 01:58 PM    CBC Latest Ref Rng & Units 07/29/2020 03/23/2020 01/20/2020  WBC 4.0 - 10.5 K/uL 11.5(H) 13.7(H) 10.3  Hemoglobin 12.0 - 15.0 g/dL 13.3 14.1 13.5  Hematocrit 36.0 - 46.0 % 40.1 43.5 40.2  Platelets 150.0 - 400.0 K/uL 146.0(L) 227 174    Lab Results  Component Value Date/Time   VD25OH 51.41 07/29/2020 03:46 PM   VD25OH 44 07/08/2014 12:31 PM   VD25OH 61 07/06/2013 12:02 PM    Clinical ASCVD: Yes  The ASCVD Risk score Mikey Bussing  Chronic Care Management Pharmacy Note  08/09/2020 Name:  Tamara Robertson MRN:  [id number] DOB:  1930/03/27  Subjective: Tamara Robertson is an 87 y.o. year old female who is a primary patient of Burns, Claudina Lick, MD.  The CCM team was consulted for assistance with disease management and care coordination needs.    Engaged with patient by telephone for follow up visit in response to provider referral for pharmacy case management and/or care coordination services.   Consent to Services:  The patient was given information about Chronic Care Management services, agreed to services, and gave verbal consent prior to initiation of services.  Please see initial visit note for detailed documentation.   Patient Care Team: Binnie Rail, MD as PCP - General (Internal Medicine) [name], MD as PCP - Cardiology (Cardiology) Almedia Balls, MD as Consulting Physician (Orthopedic Surgery) [name], MD as Consulting Physician (Cardiology) Rolm Bookbinder, MD as Consulting Physician (Dermatology) Wyatt Portela, MD (Hematology and Oncology) Nancy Marus, MD (Gynecologic Oncology) Philemon Kingdom, MD (Endocrinology) Pyrtle, Lajuan Lines, MD as Consulting Physician (Gastroenterology) Charlton Haws, [facility] as Pharmacist (Pharmacist)  Recent office visits: 07/29/20 Dr Quay Burow OV: chronic f/u. Labs stable. No changes.  Recent consult visits: 06/23/20 Dr Sallee [facility]cal [facility] ear pain 06/14/20 Dr Elisha Ponder (podiatry) 05/17/20 Dr Valetta Close (ophthalmology): f/u dry eye  Hospital visits: 03/23/20 ED visit for hypertensive crisis, though related to higher dose prednisone.  Objective:  Lab Results  Component Value Date   CREATININE 0.80 07/29/2020   BUN 17 07/29/2020   GFR 65.57 07/29/2020   GFRNONAA >60 03/23/2020   GFRAA 74 01/20/2020   NA 142 07/29/2020   K 4.7 07/29/2020   CALCIUM 10.0 07/29/2020   CO2 33 (H) 07/29/2020   GLUCOSE 98 07/29/2020    Lab Results  Component Value Date/Time    HGBA1C 5.9 07/29/2020 03:46 PM   HGBA1C 5.7 (H) 01/20/2020 03:25 PM   GFR 65.57 07/29/2020 03:46 PM   GFR 59.20 (L) 11/19/2018 03:34 PM    Last diabetic Eye exam: No results found for: HMDIABEYEEXA  Last diabetic Foot exam: No results found for: HMDIABFOOTEX   Lab Results  Component Value Date   CHOL 196 07/04/2017   HDL 58.40 05/23/2017   LDLCALC 107 (H) 05/23/2017   LDLDIRECT 155.9 03/19/2014   TRIG 193 (H) 06/30/2017   CHOLHDL 3 05/23/2017    Hepatic Function Latest Ref Rng & Units 07/29/2020 01/20/2020 11/19/2018  Total Protein 6.0 - 8.3 g/dL 7.6 6.8 7.8  Albumin 3.5 - 5.2 g/dL 4.5 - 4.6  AST 0 - 37 U/L _0 ALT 0 - 35 U/L _1 Alk Phosphatase 39 - 117 U/L 78 - 77  Total Bilirubin 0.2 - 1.2 mg/dL 0.3 0.3 0.4  Bilirubin, Direct 0.0 - 0.3 mg/dL - - -    Lab Results  Component Value Date/Time   TSH 2.23 02/11/2019 04:26 PM   TSH 2.05 07/23/2017 01:58 PM    CBC Latest Ref Rng & Units 07/29/2020 03/23/2020 01/20/2020  WBC 4.0 - 10.5 K/uL 11.5(H) 13.7(H) 10.3  Hemoglobin 12.0 - 15.0 g/dL 13.3 14.1 13.5  Hematocrit 36.0 - 46.0 % 40.1 43.5 40.2  Platelets 150.0 - 400.0 K/uL 146.0(L) 227 174    Lab Results  Component Value Date/Time   VD25OH 51.41 07/29/2020 03:46 PM   VD25OH 44 07/08/2014 12:31 PM   VD25OH 61 07/06/2013 12:02 PM    Clinical ASCVD: Yes  The ASCVD Risk score Mikey Bussing  Chronic Care Management Pharmacy Note  08/09/2020 Name:  Tamara Robertson MRN:  [id number] DOB:  Jul 23, 1929  Subjective: Tamara Robertson is an 87 y.o. year old female who is a primary patient of Burns, Claudina Lick, MD.  The CCM team was consulted for assistance with disease management and care coordination needs.    Engaged with patient by telephone for follow up visit in response to provider referral for pharmacy case management and/or care coordination services.   Consent to Services:  The patient was given information about Chronic Care Management services, agreed to services, and gave verbal consent prior to initiation of services.  Please see initial visit note for detailed documentation.   Patient Care Team: Binnie Rail, MD as PCP - General (Internal Medicine) [name], MD as PCP - Cardiology (Cardiology) Almedia Balls, MD as Consulting Physician (Orthopedic Surgery) [name], MD as Consulting Physician (Cardiology) Rolm Bookbinder, MD as Consulting Physician (Dermatology) Wyatt Portela, MD (Hematology and Oncology) Nancy Marus, MD (Gynecologic Oncology) Philemon Kingdom, MD (Endocrinology) Pyrtle, Lajuan Lines, MD as Consulting Physician (Gastroenterology) Charlton Haws, [facility] as Pharmacist (Pharmacist)  Recent office visits: 07/29/20 Dr Quay Burow OV: chronic f/u. Labs stable. No changes.  Recent consult visits: 06/23/20 Dr Sallee [facility]cal [facility] ear pain 06/14/20 Dr Elisha Ponder (podiatry) 05/17/20 Dr Valetta Close (ophthalmology): f/u dry eye  Hospital visits: 03/23/20 ED visit for hypertensive crisis, though related to higher dose prednisone.  Objective:  Lab Results  Component Value Date   CREATININE 0.80 07/29/2020   BUN 17 07/29/2020   GFR 65.57 07/29/2020   GFRNONAA >60 03/23/2020   GFRAA 74 01/20/2020   NA 142 07/29/2020   K 4.7 07/29/2020   CALCIUM 10.0 07/29/2020   CO2 33 (H) 07/29/2020   GLUCOSE 98 07/29/2020    Lab Results  Component Value Date/Time    HGBA1C 5.9 07/29/2020 03:46 PM   HGBA1C 5.7 (H) 01/20/2020 03:25 PM   GFR 65.57 07/29/2020 03:46 PM   GFR 59.20 (L) 11/19/2018 03:34 PM    Last diabetic Eye exam: No results found for: HMDIABEYEEXA  Last diabetic Foot exam: No results found for: HMDIABFOOTEX   Lab Results  Component Value Date   CHOL 196 07/04/2017   HDL 58.40 05/23/2017   LDLCALC 107 (H) 05/23/2017   LDLDIRECT 155.9 03/19/2014   TRIG 193 (H) 06/30/2017   CHOLHDL 3 05/23/2017    Hepatic Function Latest Ref Rng & Units 07/29/2020 01/20/2020 11/19/2018  Total Protein 6.0 - 8.3 g/dL 7.6 6.8 7.8  Albumin 3.5 - 5.2 g/dL 4.5 - 4.6  AST 0 - 37 U/L _0 ALT 0 - 35 U/L _1 Alk Phosphatase 39 - 117 U/L 78 - 77  Total Bilirubin 0.2 - 1.2 mg/dL 0.3 0.3 0.4  Bilirubin, Direct 0.0 - 0.3 mg/dL - - -    Lab Results  Component Value Date/Time   TSH 2.23 02/11/2019 04:26 PM   TSH 2.05 07/23/2017 01:58 PM    CBC Latest Ref Rng & Units 07/29/2020 03/23/2020 01/20/2020  WBC 4.0 - 10.5 K/uL 11.5(H) 13.7(H) 10.3  Hemoglobin 12.0 - 15.0 g/dL 13.3 14.1 13.5  Hematocrit 36.0 - 46.0 % 40.1 43.5 40.2  Platelets 150.0 - 400.0 K/uL 146.0(L) 227 174    Lab Results  Component Value Date/Time   VD25OH 51.41 07/29/2020 03:46 PM   VD25OH 44 07/08/2014 12:31 PM   VD25OH 61 07/06/2013 12:02 PM    Clinical ASCVD: Yes  The ASCVD Risk score Mikey Bussing  Chronic Care Management Pharmacy Note  08/09/2020 Name:  Tamara Robertson MRN:  [id number] DOB:  Jul 23, 1929  Subjective: Tamara Robertson is an 87 y.o. year old female who is a primary patient of Burns, Claudina Lick, MD.  The CCM team was consulted for assistance with disease management and care coordination needs.    Engaged with patient by telephone for follow up visit in response to provider referral for pharmacy case management and/or care coordination services.   Consent to Services:  The patient was given information about Chronic Care Management services, agreed to services, and gave verbal consent prior to initiation of services.  Please see initial visit note for detailed documentation.   Patient Care Team: Binnie Rail, MD as PCP - General (Internal Medicine) [name], MD as PCP - Cardiology (Cardiology) Almedia Balls, MD as Consulting Physician (Orthopedic Surgery) [name], MD as Consulting Physician (Cardiology) Rolm Bookbinder, MD as Consulting Physician (Dermatology) Wyatt Portela, MD (Hematology and Oncology) Nancy Marus, MD (Gynecologic Oncology) Philemon Kingdom, MD (Endocrinology) Pyrtle, Lajuan Lines, MD as Consulting Physician (Gastroenterology) Charlton Haws, [facility] as Pharmacist (Pharmacist)  Recent office visits: 07/29/20 Dr Quay Burow OV: chronic f/u. Labs stable. No changes.  Recent consult visits: 06/23/20 Dr [facility]e [facility]cal [facility] ear pain 06/14/20 Dr Elisha Ponder (podiatry) 05/17/20 Dr Valetta Close (ophthalmology): f/u dry eye  Hospital visits: 03/23/20 ED visit for hypertensive crisis, though related to higher dose prednisone.  Objective:  Lab Results  Component Value Date   CREATININE 0.80 07/29/2020   BUN 17 07/29/2020   GFR 65.57 07/29/2020   GFRNONAA >60 03/23/2020   GFRAA 74 01/20/2020   NA 142 07/29/2020   K 4.7 07/29/2020   CALCIUM 10.0 07/29/2020   CO2 33 (H) 07/29/2020   GLUCOSE 98 07/29/2020    Lab Results  Component Value Date/Time    HGBA1C 5.9 07/29/2020 03:46 PM   HGBA1C 5.7 (H) 01/20/2020 03:25 PM   GFR 65.57 07/29/2020 03:46 PM   GFR 59.20 (L) 11/19/2018 03:34 PM    Last diabetic Eye exam: No results found for: HMDIABEYEEXA  Last diabetic Foot exam: No results found for: HMDIABFOOTEX   Lab Results  Component Value Date   CHOL 196 07/04/2017   HDL 58.40 05/23/2017   LDLCALC 107 (H) 05/23/2017   LDLDIRECT 155.9 03/19/2014   TRIG 193 (H) 06/30/2017   CHOLHDL 3 05/23/2017    Hepatic Function Latest Ref Rng & Units 07/29/2020 01/20/2020 11/19/2018  Total Protein 6.0 - 8.3 g/dL 7.6 6.8 7.8  Albumin 3.5 - 5.2 g/dL 4.5 - 4.6  AST 0 - 37 U/L _0 ALT 0 - 35 U/L _1 Alk Phosphatase 39 - 117 U/L 78 - 77  Total Bilirubin 0.2 - 1.2 mg/dL 0.3 0.3 0.4  Bilirubin, Direct 0.0 - 0.3 mg/dL - - -    Lab Results  Component Value Date/Time   TSH 2.23 02/11/2019 04:26 PM   TSH 2.05 07/23/2017 01:58 PM    CBC Latest Ref Rng & Units 07/29/2020 03/23/2020 01/20/2020  WBC 4.0 - 10.5 K/uL 11.5(H) 13.7(H) 10.3  Hemoglobin 12.0 - 15.0 g/dL 13.3 14.1 13.5  Hematocrit 36.0 - 46.0 % 40.1 43.5 40.2  Platelets 150.0 - 400.0 K/uL 146.0(L) 227 174    Lab Results  Component Value Date/Time   VD25OH 51.41 07/29/2020 03:46 PM   VD25OH 44 07/08/2014 12:31 PM   VD25OH 61 07/06/2013 12:02 PM    Clinical ASCVD: Yes  The ASCVD Risk score Mikey Bussing

## 2020-08-18 ENCOUNTER — Encounter: Payer: Self-pay | Admitting: Internal Medicine

## 2020-08-22 DIAGNOSIS — M7661 Achilles tendinitis, right leg: Secondary | ICD-10-CM | POA: Diagnosis not present

## 2020-09-02 ENCOUNTER — Telehealth: Payer: Self-pay | Admitting: Pharmacist

## 2020-09-02 NOTE — Progress Notes (Signed)
Chronic Care Management Pharmacy Assistant   Name: Angelique Wojciechowski  MRN: [id number] DOB: August 05, 1929    Reason for Encounter: Chart Review    Medications: Outpatient Encounter Medications as of 09/02/2020  Medication Sig  . acetaminophen (TYLENOL) 500 MG tablet Take 500 mg by mouth every 6 (six) hours as needed.  . Biotin 2500 MCG CAPS Take 1 capsule by mouth daily.   . calcium carbonate (TUMS - DOSED IN MG ELEMENTAL CALCIUM) 500 MG chewable tablet Chew 1 tablet by mouth daily as needed for indigestion or heartburn.  . Calcium-Magnesium-Vitamin D 600-40-500 MG-MG-UNIT TB24 Take 1 tablet by mouth daily.  . cycloSPORINE (RESTASIS) 0.05 % ophthalmic emulsion 1 drop 2 (two) times daily.  [name] escitalopram (LEXAPRO) 5 MG tablet TAKE ONE TABLET BY MOUTH ONE TIME DAILY (Patient taking differently: Patient takes 1/2 tablet daily)  . famotidine (PEPCID) 20 MG tablet Take 1 tablet (20 mg total) by mouth 2 (two) times daily as needed for heartburn or indigestion.  . Flaxseed MISC Take 5 mLs by mouth daily. Take 1 tsp daily  . furosemide (LASIX) 20 MG tablet Take 0.5 tablets (10 mg total) by mouth as needed.  . Glucos-Chond-Hyal Ac-Ca Fructo (MOVE FREE JOINT HEALTH ADVANCE PO) Take by mouth.  . losartan (COZAAR) 25 MG tablet Take 1 tablet (25 mg total) by mouth 2 (two) times daily.  . Magnesium Oxide 250 MG TABS Take by mouth.  . metoprolol succinate (TOPROL-XL) 25 MG 24 hr tablet Take 0.5 tablets (12.5 mg total) by mouth daily. Take with or immediately following a meal.  . Multiple Vitamin (MULTIVITAMIN) tablet Take 1 tablet by mouth daily.    . naproxen sodium (ALEVE) 220 MG tablet Take 220 mg by mouth as needed.   . polyethylene glycol (MIRALAX / GLYCOLAX) packet Take 17 g by mouth daily as needed for moderate constipation.   [name] PREDNISONE PO Take 5 mg by mouth. Every other day  . Probiotic Product (PROBIOTIC DAILY PO) Take by mouth.  . psyllium (METAMUCIL) 58.6 % packet Take 1 packet by mouth  daily as needed.  . trolamine salicylate (ASPERCREME) 10 % cream Apply 1 application topically as needed for muscle pain.   No facility-administered encounter medications on file as of 09/02/2020.    Reviewed chart for medication changes and adherence.   No gaps in adherence identified. Patient has follow up scheduled with pharmacy team. No further action required.   Velvet Bathe Clinical Pharmacist Assistant [phone]  Time spent:2

## 2020-09-06 ENCOUNTER — Ambulatory Visit: Payer: Medicare HMO | Admitting: Neurology

## 2020-09-13 ENCOUNTER — Ambulatory Visit: Payer: Medicare HMO | Admitting: Podiatry

## 2020-09-15 DIAGNOSIS — H401134 Primary open-angle glaucoma, bilateral, indeterminate stage: Secondary | ICD-10-CM | POA: Diagnosis not present

## 2020-09-15 DIAGNOSIS — H04123 Dry eye syndrome of bilateral lacrimal glands: Secondary | ICD-10-CM | POA: Diagnosis not present

## 2020-09-26 NOTE — Progress Notes (Signed)
Gynecologic Oncology Return Clinic Visit  09/27/20  Reason for Visit: Follow-up in the setting of a known adnexal cyst.  Treatment History: 01/2013: 2.8cm left ovarian cyst on pelvic ultrasound 05/2013: Either two simple adjacent cysts (both with largest diameter of 2.5cm or less) vs 3.5cm cyst with thin internal septation on pelvic ultrasound. 05/2014: stable appearance of left ovarian cysts on ultrasound. 03/2015: slight interval growth of minimally complex 3.3 left ovarian cystic structure on ultrasound. 07/2015: stable 3.3cm mildly complex left ovarian cyst on ultrasound. Two other small simple appearing cysts (1cm) also noted. 01/2016: stable 3.3cm minimally complex left adnexal cyst, minimally enlarged 1.8cm minimally complex left cyst. 07/2016: fairly stable appearing complex cysts in left ovary. 01/2017: 4.1cm indeterminate but probably benign left adnexal cyst, no significant change since prior. 01/2018: 4.2cm cystic lesion with gradual increase in size.  01/2019: Transabdominal ultrasound shows 7cm adnexal lesion.  01/2019: MRI pelvis w/o contrast shows 4.2cm predominantly simple left ovarian cyst. Only minimal growth since 2014. 02/2020: MRI pelvis w/o contrast shows stable 4.2cm benign-appearing cystic struture of left adnexa.  Interval History: Patient presents today for follow-up with the above history of surveillance in the setting of a known left adnexal cyst.  She notes overall doing well since her last visit.  She endorses a good appetite without nausea or emesis.  She has intermittent bloating associated with bowel function that is baseline for her.  She continues to have intermittent constipation as well as diarrhea.  She notes some very mild and dull pain in her left lower abdomen if she gets constipated, has gas, or has hard bowel function.  Otherwise, she denies any abdominal or pelvic pain.  She denies any vaginal bleeding or discharge.  She has some urinary urgency, which has been  her baseline for some time, denies other urinary symptoms.  Had a mammogram 11/2019.  Past Medical/Surgical History: Past Medical History:  Diagnosis Date  . Anxiety   . CAD (coronary artery disease)    RCA 40% stenosis cath 01/2011  . Cholelithiasis    s/p lap chole 09/2014  . Diverticular stricture [facility]) 2006   [facility]  . DIVERTICULITIS, HX OF   . Diverticulosis   . DYSLIPIDEMIA   . Elevated LFTs   . GERD   . Glaucoma   . Hepatic steatosis   . HOH (hard of hearing)   . Immune thrombocytopenic purpura (HCC)    chronic - baseline 80-100K, on pred  . Irritable bowel syndrome   . Left ovarian cyst dx 01/2013 CT   working with gyn, ?malignant - elevated tumor marker OVA1  . OSTEOARTHRITIS, KNEE, RIGHT   . OSTEOPENIA   . OVERACTIVE BLADDER   . Prolapse of female bladder, acquired 11/2017  . UNSPECIFIED PERIPHERAL VASCULAR DISEASE   . URINARY INCONTINENCE     Past Surgical History:  Procedure Laterality Date  . ABDOMINAL HYSTERECTOMY  1963  . ANGIOPLASTY    . APPENDECTOMY  1956  . CARDIAC CATHETERIZATION    . CATARACT EXTRACTION, BILATERAL  10/2010  . CHOLECYSTECTOMY N/A 09/16/2014   Procedure: LAPAROSCOPIC CHOLECYSTECTOMY ;  Surgeon: Rolm Bookbinder, MD;  Location: [place];  Service: General;  Laterality: N/A;  . KNEE ARTHROSCOPY Right   . L pop PTA  10/2009   stent  . LAPAROSCOPIC SIGMOID COLECTOMY  10/2005  . SPLENECTOMY  1954  . VARICOSE VEIN SURGERY Right 1962    Family History  Problem Relation Age of Onset  . Coronary artery disease Mother   .  Heart attack Mother 34  . Hyperlipidemia Mother   . Hypertension Mother   . Stomach cancer Father   . Hypertension Daughter   . Hyperlipidemia Daughter   . Arthritis Other        parent  . Transient ischemic attack Other        parent  . Colon cancer Neg Hx     Social History   Socioeconomic History  . Marital status: Widowed    Spouse name: Not on file  . Number of children: 2  . Years  of education: Not on file  . Highest education level: Not on file  Occupational History  . Occupation: Retired    Comment: Retail banker  Tobacco Use  . Smoking status: Never Smoker  . Smokeless tobacco: Never Used  Vaping Use  . Vaping Use: Never used  Substance and Sexual Activity  . Alcohol use: No    Alcohol/week: 0.0 standard drinks    Comment: rarely  . Drug use: No  . Sexual activity: Not Currently    Comment: 1st intercourse- 19, partners- 2,  widow  Other Topics Concern  . 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    Dorie Rank to [place] from [place] 05/2010 to be close to kids   Social Determinants of Health   Financial Resource Strain: Low Risk   . Difficulty of Paying Living Expenses: Not hard at all  Food Insecurity: No Food Insecurity  . Worried About Charity fundraiser in the Last Year: Never true  . Ran Out of Food in the Last Year: Never true  Transportation Needs: No Transportation Needs  . Lack of Transportation (Medical): No  . Lack of Transportation (Non-Medical): No  Physical Activity: Inactive  . Days of Exercise per Week: 0 days  . Minutes of Exercise per Session: 0 min  Stress: No Stress Concern Present  . Feeling of Stress : Not at all  Social Connections: Moderately Integrated  . Frequency of Communication with Friends and Family: More than three times a week  . Frequency of Social Gatherings with Friends and Family: Once a week  . Attends Religious Services: More than 4 times per year  . Active Member of Clubs or Organizations: Yes  . Attends Archivist Meetings: More than 4 times per year  . Marital Status: Widowed    Current Medications:  Current Outpatient Medications:  .  acetaminophen (TYLENOL) 500 MG tablet, Take 500 mg by mouth every 6 (six) hours as needed., Disp: , Rfl:  .  Biotin 2500 MCG CAPS, Take 1 capsule by mouth daily. , Disp: , Rfl:  .  calcium  carbonate (TUMS - DOSED IN MG ELEMENTAL CALCIUM) 500 MG chewable tablet, Chew 1 tablet by mouth daily as needed for indigestion or heartburn., Disp: , Rfl:  .  Calcium-Magnesium-Vitamin D 600-40-500 MG-MG-UNIT TB24, Take 1 tablet by mouth daily., Disp: , Rfl:  .  cycloSPORINE (RESTASIS) 0.05 % ophthalmic emulsion, 1 drop 2 (two) times daily., Disp: , Rfl:  .  escitalopram (LEXAPRO) 5 MG tablet, TAKE ONE TABLET BY MOUTH ONE TIME DAILY (Patient taking differently: Patient takes 1/2 tablet daily), Disp: 30 tablet, Rfl: 0 .  famotidine (PEPCID) 20 MG tablet, Take 1 tablet (20 mg total) by mouth 2 (two) times daily as needed for heartburn or indigestion., Disp: 60 tablet, Rfl: 3 .  Flaxseed MISC, Take 5 mLs by mouth daily. Take 1  tsp daily, Disp: , Rfl:  .  furosemide (LASIX) 20 MG tablet, Take 0.5 tablets (10 mg total) by mouth as needed., Disp: 45 tablet, Rfl: 2 .  Glucos-Chond-Hyal Ac-Ca Fructo (MOVE FREE JOINT HEALTH ADVANCE PO), Take by mouth., Disp: , Rfl:  .  losartan (COZAAR) 25 MG tablet, Take 1 tablet (25 mg total) by mouth 2 (two) times daily., Disp: 180 tablet, Rfl: 3 .  Magnesium Oxide 250 MG TABS, Take by mouth., Disp: , Rfl:  .  metoprolol succinate (TOPROL-XL) 25 MG 24 hr tablet, Take 0.5 tablets (12.5 mg total) by mouth daily. Take with or immediately following a meal., Disp: 45 tablet, Rfl: 2 .  Multiple Vitamin (MULTIVITAMIN) tablet, Take 1 tablet by mouth daily.  , Disp: , Rfl:  .  naproxen sodium (ALEVE) 220 MG tablet, Take 220 mg by mouth as needed. , Disp: , Rfl:  .  polyethylene glycol (MIRALAX / GLYCOLAX) packet, Take 17 g by mouth daily as needed for moderate constipation. , Disp: , Rfl:  .  PREDNISONE PO, Take 2.5 mg by mouth every other day. Every other day, Disp: , Rfl:  .  Probiotic Product (PROBIOTIC DAILY PO), Take by mouth., Disp: , Rfl:  .  psyllium (METAMUCIL) 58.6 % packet, Take 1 packet by mouth daily as needed., Disp: , Rfl:  .  trolamine salicylate (ASPERCREME) 10 %  cream, Apply 1 application topically as needed for muscle pain., Disp: , Rfl:   Review of Systems: Denies appetite changes, fevers, chills, fatigue, unexplained weight changes. Denies hearing loss, neck lumps or masses, mouth sores, ringing in ears or voice changes. Denies cough or wheezing.  Denies shortness of breath. Denies chest pain or palpitations. Denies leg swelling. Denies abdominal distention, pain, blood in stools, nausea, vomiting, or early satiety. Denies pain with intercourse, dysuria, frequency, hematuria or incontinence. Denies hot flashes, pelvic pain, vaginal bleeding or vaginal discharge.   Denies joint pain, back pain or muscle pain/cramps. Denies itching, rash, or wounds. Denies dizziness, headaches, numbness or seizures. Denies swollen lymph nodes or glands, denies easy bruising or bleeding. Denies anxiety, depression, confusion, or decreased concentration.  Physical Exam: BP (!) 128/53 (BP Location: Left Arm, Patient Position: Sitting)   Pulse 77   Temp (!) 96.2 F (35.7 C) (Tympanic)   Resp 18   Ht 5\' 1"  (1.549 m)   Wt 150 lb 9.6 oz (68.3 kg)   SpO2 98%   BMI 28.46 kg/m  General: Alert, oriented, no acute distress. HEENT: Normocephalic, atraumatic, sclera anicteric. Chest: Unlabored breathing on room air. Abdomen: soft, nontender.  Normoactive bowel sounds.  No masses or hepatosplenomegaly appreciated.  Well-healed incisions. Extremities: Grossly normal range of motion.  Warm, well perfused.  No edema bilaterally. Skin: No rashes or lesions noted. Lymphatics: No cervical, supraclavicular, or inguinal adenopathy. GU: Normal appearing external genitalia without erythema, excoriation, or lesions.  Bimanual exam reveals cuff intact, no tenderness.  There is maybe some smooth fullness in the midline that is mobile, but very difficult to tell whether this is a discrete mass.  No nodularity appreciated.    Laboratory & Radiologic Studies: MRI pelvis 03/04/20: 1.  Stable 4.2 x 3.6 x 3.5 cm benign-appearing cystic structure in the left adnexal region. This has been present since 2014 and is most likely a benign postoperative cyst. I do not think any further imaging evaluation or follow-up is necessary. 2. Stable significant cystocele.  Assessment & Plan: Tamara Robertson is a 87 y.o. woman with a persistent  benign appearing left adnexal structure that has been present since 2014 with very minimal growth.  We reviewed in detail recent MRI from last October.  Her cyst on the left has not increased in size and continues to be benign in appearance.  There really has been no growth since about 2018.  Given its appearance and size, I think it is very unlikely to be anything malignant or borderline.  I would recommend discontinuing routine imaging.  The patient will continue to follow-up with her gynecologist and she and I discussed symptoms that would be concerning.  If she develops any of these before her scheduled visit with her gynecologist, then she should either call to be seen there or can call my office.  If that were the case, depending on the symptoms she endorsed, it may be that follow-up imaging is recommended to rule out any growth of her adnexal mass.  28 minutes of total time was spent for this patient encounter, including preparation, face-to-face counseling with the patient and coordination of care, and documentation of the encounter.  Jeral Pinch, MD  Division of Gynecologic Oncology  Department of Obstetrics and Gynecology  [facility]

## 2020-09-27 ENCOUNTER — Encounter: Payer: Self-pay | Admitting: Gynecologic Oncology

## 2020-09-27 ENCOUNTER — Inpatient Hospital Stay: Payer: Medicare HMO | Attending: Gynecologic Oncology | Admitting: Gynecologic Oncology

## 2020-09-27 ENCOUNTER — Other Ambulatory Visit: Payer: Self-pay

## 2020-09-27 VITALS — BP 128/53 | HR 77 | Temp 96.2°F | Resp 18 | Ht 61.0 in | Wt 150.6 lb

## 2020-09-27 DIAGNOSIS — R3915 Urgency of urination: Secondary | ICD-10-CM | POA: Insufficient documentation

## 2020-09-27 DIAGNOSIS — N9489 Other specified conditions associated with female genital organs and menstrual cycle: Secondary | ICD-10-CM

## 2020-09-27 DIAGNOSIS — E278 Other specified disorders of adrenal gland: Secondary | ICD-10-CM | POA: Insufficient documentation

## 2020-09-27 DIAGNOSIS — N83202 Unspecified ovarian cyst, left side: Secondary | ICD-10-CM | POA: Insufficient documentation

## 2020-09-27 NOTE — Patient Instructions (Signed)
It was a pleasure meeting you!  I recommend that we not get any further routine imaging. If you develop new symptoms as we discussed today (change to bowel habits, weight loss, abdominal pain), please call to let me know and we can discuss getting some imaging.  I will let Dr. Dellis Filbert know what we discussed today.

## 2020-09-30 ENCOUNTER — Ambulatory Visit: Payer: Medicare HMO | Admitting: Neurology

## 2020-10-02 ENCOUNTER — Other Ambulatory Visit: Payer: Self-pay | Admitting: Internal Medicine

## 2020-10-05 DIAGNOSIS — I872 Venous insufficiency (chronic) (peripheral): Secondary | ICD-10-CM | POA: Insufficient documentation

## 2020-10-05 DIAGNOSIS — L719 Rosacea, unspecified: Secondary | ICD-10-CM | POA: Insufficient documentation

## 2020-10-05 DIAGNOSIS — L820 Inflamed seborrheic keratosis: Secondary | ICD-10-CM | POA: Insufficient documentation

## 2020-10-05 DIAGNOSIS — D225 Melanocytic nevi of trunk: Secondary | ICD-10-CM | POA: Insufficient documentation

## 2020-10-05 DIAGNOSIS — B079 Viral wart, unspecified: Secondary | ICD-10-CM | POA: Insufficient documentation

## 2020-10-05 DIAGNOSIS — S6000XA Contusion of unspecified finger without damage to nail, initial encounter: Secondary | ICD-10-CM | POA: Insufficient documentation

## 2020-10-05 DIAGNOSIS — M316 Other giant cell arteritis: Secondary | ICD-10-CM | POA: Insufficient documentation

## 2020-10-06 ENCOUNTER — Ambulatory Visit: Payer: Medicare HMO

## 2020-10-06 ENCOUNTER — Encounter (HOSPITAL_COMMUNITY): Payer: Medicare HMO

## 2020-10-07 ENCOUNTER — Ambulatory Visit (INDEPENDENT_AMBULATORY_CARE_PROVIDER_SITE_OTHER)
Admission: RE | Admit: 2020-10-07 | Discharge: 2020-10-07 | Disposition: A | Discharge status: Home / Self Care | Payer: Medicare HMO | Source: Ambulatory Visit | Attending: Physician Assistant | Admitting: Physician Assistant

## 2020-10-07 ENCOUNTER — Ambulatory Visit (HOSPITAL_COMMUNITY)
Admission: RE | Admit: 2020-10-07 | Discharge: 2020-10-07 | Disposition: A | Discharge status: Home / Self Care | Payer: Medicare HMO | Source: Ambulatory Visit | Attending: Vascular Surgery | Admitting: Vascular Surgery

## 2020-10-07 ENCOUNTER — Encounter: Payer: Self-pay | Admitting: Physician Assistant

## 2020-10-07 ENCOUNTER — Other Ambulatory Visit: Payer: Self-pay

## 2020-10-07 ENCOUNTER — Ambulatory Visit: Payer: Medicare HMO | Admitting: Physician Assistant

## 2020-10-07 VITALS — BP 140/65 | HR 75 | Temp 98.4°F | Resp 20 | Ht 61.0 in | Wt 150.7 lb

## 2020-10-07 DIAGNOSIS — I739 Peripheral vascular disease, unspecified: Secondary | ICD-10-CM | POA: Insufficient documentation

## 2020-10-07 DIAGNOSIS — I83892 Varicose veins of left lower extremities with other complications: Secondary | ICD-10-CM | POA: Diagnosis not present

## 2020-10-07 DIAGNOSIS — G6289 Other specified polyneuropathies: Secondary | ICD-10-CM

## 2020-10-07 NOTE — Progress Notes (Signed)
Office Note     CC:  follow up Requesting Provider:  Pincus Sanes, MD  HPI: Tamara Robertson is a 87 y.o. ([birth year]) female who presents for follow up of peripheral artery disease. She presents today for 6 months follow up with ABI and LLE duplex. She does continue to have left thigh cramping and left hip pain from bursitis in her left hip. The cramping is very intermittent. She does also have bursitis in her left heel which still bothers her from time to time.She has seen Orthopedics for this. She more recently has been having intermittent numbness in her legs, left greater than right on ambulation. It is not every time that she walks but she explains that when it occurs it is like her leg goes to sleep and feels like she is just flopping her leg along. There have been a few occasions where she wasn't sure she would be able to get to her destination because of this and has fallen several times. Eventually the numbness and tingling will subside. Has never occurred in both legs at the same time. She does have history of low back pain and arthritis. She otherwise denies any pain in her calfs or pain that wakes her up at night. She has no non healing wounds.   She also has history of venous insufficiency. She has had saphenous vein stripping of the RLE but none in her LLE. She is unable to wear socks and certainly cannot use her compression stockings due to struggling to get them on as she lives alone.  She elevates her legs during the day in her recliner. She denies any bleeding or venous ulcerations of bilateral lower extremities.  The pt is on a statin for cholesterol management.  The pt is not on a daily aspirin.   Other AC: none The pt is on ARB, diuretic for hypertension.   The pt is not diabetic.  Tobacco hx:  Never smoker  Past Medical History:  Diagnosis Date  . Anxiety   . CAD (coronary artery disease)    RCA 40% stenosis cath 01/2011  . Cholelithiasis    s/p lap chole 09/2014   . Diverticular stricture [facility]) 2006   [facility]  . DIVERTICULITIS, HX OF   . Diverticulosis   . DYSLIPIDEMIA   . Elevated LFTs   . GERD   . Glaucoma   . Hepatic steatosis   . HOH (hard of hearing)   . Immune thrombocytopenic purpura (HCC)    chronic - baseline 80-100K, on pred  . Irritable bowel syndrome   . Left ovarian cyst dx 01/2013 CT   working with gyn, ?malignant - elevated tumor marker OVA1  . OSTEOARTHRITIS, KNEE, RIGHT   . OSTEOPENIA   . OVERACTIVE BLADDER   . Prolapse of female bladder, acquired 11/2017  . UNSPECIFIED PERIPHERAL VASCULAR DISEASE   . URINARY INCONTINENCE     Past Surgical History:  Procedure Laterality Date  . ABDOMINAL HYSTERECTOMY  1963  . ANGIOPLASTY    . APPENDECTOMY  1956  . CARDIAC CATHETERIZATION    . CATARACT EXTRACTION, BILATERAL  10/2010  . CHOLECYSTECTOMY N/A 09/16/2014   Procedure: LAPAROSCOPIC CHOLECYSTECTOMY ;  Surgeon: Emelia Loron, MD;  Location: [facility] OR;  Service: General;  Laterality: N/A;  . KNEE ARTHROSCOPY Right   . L pop PTA  10/2009   stent  . LAPAROSCOPIC SIGMOID COLECTOMY  10/2005  . SPLENECTOMY  1954  . VARICOSE VEIN SURGERY Right 1962  Social History   Socioeconomic History  . Marital status: Widowed    Spouse name: Not on file  . Number of children: 2  . Years of education: Not on file  . Highest education level: Not on file  Occupational History  . Occupation: Retired    Comment: Programmer, multimedia  Tobacco Use  . Smoking status: Never Smoker  . Smokeless tobacco: Never Used  Vaping Use  . Vaping Use: Never used  Substance and Sexual Activity  . Alcohol use: No    Alcohol/week: 0.0 standard drinks    Comment: rarely  . Drug use: No  . Sexual activity: Not Currently    Comment: 1st intercourse- 19, partners- 2,  widow  Other Topics Concern  . 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    [place] to [place] from [place]  [place] 05/2010 to be close to kids   Social Determinants of Health   Financial Resource Strain: Low Risk   . Difficulty of Paying Living Expenses: Not hard at all  Food Insecurity: No Food Insecurity  . Worried About Programme researcher, broadcasting/film/video in the Last Year: Never true  . Ran Out of Food in the Last Year: Never true  Transportation Needs: No Transportation Needs  . Lack of Transportation (Medical): No  . Lack of Transportation (Non-Medical): No  Physical Activity: Inactive  . Days of Exercise per Week: 0 days  . Minutes of Exercise per Session: 0 min  Stress: No Stress Concern Present  . Feeling of Stress : Not at all  Social Connections: Moderately Integrated  . Frequency of Communication with Friends and Family: More than three times a week  . Frequency of Social Gatherings with Friends and Family: Once a week  . Attends Religious Services: More than 4 times per year  . Active Member of Clubs or Organizations: Yes  . Attends Banker Meetings: More than 4 times per year  . Marital Status: Widowed  Intimate Partner Violence: Not on file    Family History  Problem Relation Age of Onset  . Coronary artery disease Mother   . Heart attack Mother 74  . Hyperlipidemia Mother   . Hypertension Mother   . Stomach cancer Father   . Hypertension Daughter   . Hyperlipidemia Daughter   . Arthritis Other        parent  . Transient ischemic attack Other        parent  . Colon cancer Neg Hx     Current Outpatient Medications  Medication Sig Dispense Refill  . acetaminophen (TYLENOL) 500 MG tablet Take 500 mg by mouth every 6 (six) hours as needed.    . Biotin 2500 MCG CAPS Take 1 capsule by mouth daily.     . calcium carbonate (TUMS - DOSED IN MG ELEMENTAL CALCIUM) 500 MG chewable tablet Chew 1 tablet by mouth daily as needed for indigestion or heartburn.    . Calcium-Magnesium-Vitamin D 600-40-500 MG-MG-UNIT TB24 Take 1 tablet by mouth daily.    . cycloSPORINE (RESTASIS) 0.05 %  ophthalmic emulsion 1 drop 2 (two) times daily.    [name] escitalopram (LEXAPRO) 5 MG tablet TAKE ONE TABLET BY MOUTH ONE TIME DAILY 30 tablet 0  . famotidine (PEPCID) 20 MG tablet Take 1 tablet (20 mg total) by mouth 2 (two) times daily as needed for heartburn or indigestion. 60 tablet 3  . Flaxseed MISC Take 5 mLs by mouth  daily. Take 1 tsp daily    . furosemide (LASIX) 20 MG tablet Take 0.5 tablets (10 mg total) by mouth as needed. 45 tablet 2  . Glucos-Chond-Hyal Ac-Ca Fructo (MOVE FREE JOINT HEALTH ADVANCE PO) Take by mouth.    . losartan (COZAAR) 25 MG tablet Take 1 tablet (25 mg total) by mouth 2 (two) times daily. 180 tablet 3  . Magnesium Oxide 250 MG TABS Take by mouth.    . metoprolol succinate (TOPROL-XL) 25 MG 24 hr tablet Take 0.5 tablets (12.5 mg total) by mouth daily. Take with or immediately following a meal. 45 tablet 2  . Multiple Vitamin (MULTIVITAMIN) tablet Take 1 tablet by mouth daily.      . naproxen sodium (ALEVE) 220 MG tablet Take 220 mg by mouth as needed.     . polyethylene glycol (MIRALAX / GLYCOLAX) packet Take 17 g by mouth daily as needed for moderate constipation.     [name] PREDNISONE PO Take 2.5 mg by mouth every other day. Every other day    . Probiotic Product (PROBIOTIC DAILY PO) Take by mouth.    . psyllium (METAMUCIL) 58.6 % packet Take 1 packet by mouth daily as needed.    . trolamine salicylate (ASPERCREME) 10 % cream Apply 1 application topically as needed for muscle pain.     No current facility-administered medications for this visit.    Allergies  Allergen Reactions  . Aspirin Other (See Comments)    ITP  . Sulfa Antibiotics Other (See Comments)    dizziness  . Contrast Media [Iodinated Diagnostic Agents] Hives    CAT scan contrast only  . Fluoxetine     Did not feel well on it   . Other     Other reaction(s): Unknown  . Celebrex [Celecoxib] Other (See Comments)    Leg swelling  . Celexa [Citalopram] Other (See Comments)    Felt out of it  .  Statins Other (See Comments)     REVIEW OF SYSTEMS:   [X]  denotes positive finding, [ ]  denotes negative finding Cardiac  Comments:  Chest pain or chest pressure:    Shortness of breath upon exertion:    Short of breath when lying flat:    Irregular heart rhythm:        Vascular    Pain in calf, thigh, or hip brought on by ambulation:    Pain in feet at night that wakes you up from your sleep:     Blood clot in your veins:    Leg swelling:         Pulmonary    Oxygen at home:    Productive cough:     Wheezing:         Neurologic    Sudden weakness in arms or legs:     Sudden numbness in arms or legs:     Sudden onset of difficulty speaking or slurred speech:    Temporary loss of vision in one eye:     Problems with dizziness:         Gastrointestinal    Blood in stool:     Vomited blood:         Genitourinary    Burning when urinating:     Blood in urine:        Psychiatric    Major depression:         Hematologic    Bleeding problems:    Problems with blood clotting too easily:  Skin    Rashes or ulcers:        Constitutional    Fever or chills:      PHYSICAL EXAMINATION:  Vitals:   10/07/20 1517  BP: 140/65  Pulse: 75  Resp: 20  Temp: 98.4 F (36.9 C)  TempSrc: Temporal  SpO2: 99%  Weight: 150 lb 11.2 oz (68.4 kg)  Height: 5\' 1"  (1.549 m)    General:  WDWN in NAD; vital signs documented above Gait: Normal HENT: WNL, normocephalic Pulmonary: normal non-labored breathing , without wheezing Cardiac: regular HR, without  Murmurs without carotid bruit Abdomen: soft, NT, no masses Vascular Exam/Pulses:  Right Left  Radial 2+ (normal) 2+ (normal)  Femoral 2+ (normal) 2+ (normal)  Popliteal 2+ (normal) 2+ (normal)  DP 1+ (weak) 2+ (normal)  PT 1+ (weak) 1+ (weak)   Extremities: without ischemic changes, without Gangrene , without cellulitis; without open wounds;  Musculoskeletal: no muscle wasting or atrophy  Neurologic: A&O X 3;  No  focal weakness or paresthesias are detected Psychiatric:  The pt has Normal affect.   Non-Invasive Vascular Imaging:    +-------+-----------+-----------+------------+------------+  ABI/TBIToday's ABIToday's TBIPrevious ABIPrevious TBI  +-------+-----------+-----------+------------+------------+  Right 0.79    0.65    0.87    0.54      +-------+-----------+-----------+------------+------------+  Left  0.91    0.64    0.99    0.56      +-------+-----------+-----------+------------+------------+  Right Great toe pressure: 103 Left great toe pressure: 101  +----------+--------+-----+--------+----------+--------+  LEFT   PSV cm/sRatioStenosisWaveform Comments  +----------+--------+-----+--------+----------+--------+  CFA Distal111          biphasic       +----------+--------+-----+--------+----------+--------+  SFA Prox 131          biphasic       +----------+--------+-----+--------+----------+--------+  SFA Mid  129          biphasic       +----------+--------+-----+--------+----------+--------+  SFA Distal129          biphasic       +----------+--------+-----+--------+----------+--------+  POP Prox 97          monophasic      +----------+--------+-----+--------+----------+--------+  POP Distal78          monophasic      +----------+--------+-----+--------+----------+--------+  ATA Distal118          biphasic       +----------+--------+-----+--------+----------+--------+  PTA Distal71          monophasic      +----------+--------+-----+--------+----------+--------+    ASSESSMENT/PLAN:: 87 y.o. female here for follow up for peripheral artery disease. Her symptoms are overall stable. Most of her complaints are secondary to her bursitis, cramping, and numbness/  tingling in her legs. Her cramping is not claudication in nature. Her bilateral lower extremities are well-perfused with a palpable pulses andABIs within normal limits. Her Duplex shows tibial disease, which is known but no areas of stenosis. Etiology of her left foot paresthesia is likely due to her previously diagnosed lumbar stenosis and neuropathy. I have recommended that she follow up again with her Orthopedist for further evaluation of this as it seems to be very lifestyle limiting.   I encouraged her to continue to elevate her legs when possible during the day  Continue walking regimen as able  She will return to office sooner if any rest pain or nonhealing wounds develop  I will bring her back again at a closer interval to follow up in 6 months with ABI and LLE arterial duplex  Graceann Congress, PA-C [facility] [phone]  Clinic MD:  Randie Heinz

## 2020-10-11 ENCOUNTER — Other Ambulatory Visit: Payer: Self-pay

## 2020-10-11 DIAGNOSIS — I739 Peripheral vascular disease, unspecified: Secondary | ICD-10-CM

## 2020-10-18 ENCOUNTER — Encounter: Payer: Self-pay | Admitting: Internal Medicine

## 2020-10-18 DIAGNOSIS — R519 Headache, unspecified: Secondary | ICD-10-CM

## 2020-10-18 DIAGNOSIS — G8929 Other chronic pain: Secondary | ICD-10-CM

## 2020-10-18 DIAGNOSIS — R2689 Other abnormalities of gait and mobility: Secondary | ICD-10-CM

## 2020-10-20 ENCOUNTER — Telehealth: Payer: Self-pay | Admitting: Pharmacist

## 2020-10-20 DIAGNOSIS — H401114 Primary open-angle glaucoma, right eye, indeterminate stage: Secondary | ICD-10-CM | POA: Diagnosis not present

## 2020-10-20 NOTE — Progress Notes (Signed)
Chronic Care Management Pharmacy Assistant   Name: Tamara Robertson  MRN: [id number] DOB: February 25, 1930   Reason for Encounter: Disease State   Conditions to be addressed/monitored: HTN  Primary concerns for visit include: Blood pressure   Recent office visits:  07/29/20 Dr. Lawerance Bach   Recent consult visits:  09/27/20 Dr. Cheree Ditto visits:  None in previous 6 months  Medications: Outpatient Encounter Medications as of 10/20/2020  Medication Sig   acetaminophen (TYLENOL) 500 MG tablet Take 500 mg by mouth every 6 (six) hours as needed.   Biotin 2500 MCG CAPS Take 1 capsule by mouth daily.    calcium carbonate (TUMS - DOSED IN MG ELEMENTAL CALCIUM) 500 MG chewable tablet Chew 1 tablet by mouth daily as needed for indigestion or heartburn.   Calcium-Magnesium-Vitamin D 600-40-500 MG-MG-UNIT TB24 Take 1 tablet by mouth daily.   cycloSPORINE (RESTASIS) 0.05 % ophthalmic emulsion 1 drop 2 (two) times daily.   escitalopram (LEXAPRO) 5 MG tablet TAKE ONE TABLET BY MOUTH ONE TIME DAILY   famotidine (PEPCID) 20 MG tablet Take 1 tablet (20 mg total) by mouth 2 (two) times daily as needed for heartburn or indigestion.   Flaxseed MISC Take 5 mLs by mouth daily. Take 1 tsp daily   furosemide (LASIX) 20 MG tablet Take 0.5 tablets (10 mg total) by mouth as needed.   Glucos-Chond-Hyal Ac-Ca Fructo (MOVE FREE JOINT HEALTH ADVANCE PO) Take by mouth.   losartan (COZAAR) 25 MG tablet Take 1 tablet (25 mg total) by mouth 2 (two) times daily.   Magnesium Oxide 250 MG TABS Take by mouth.   metoprolol succinate (TOPROL-XL) 25 MG 24 hr tablet Take 0.5 tablets (12.5 mg total) by mouth daily. Take with or immediately following a meal.   Multiple Vitamin (MULTIVITAMIN) tablet Take 1 tablet by mouth daily.     naproxen sodium (ALEVE) 220 MG tablet Take 220 mg by mouth as needed.    polyethylene glycol (MIRALAX / GLYCOLAX) packet Take 17 g by mouth daily as needed for moderate constipation.     PREDNISONE PO Take 2.5 mg by mouth every other day. Every other day   Probiotic Product (PROBIOTIC DAILY PO) Take by mouth.   psyllium (METAMUCIL) 58.6 % packet Take 1 packet by mouth daily as needed.   trolamine salicylate (ASPERCREME) 10 % cream Apply 1 application topically as needed for muscle pain.   No facility-administered encounter medications on file as of 10/20/2020.    Pharmacist Review Reviewed chart prior to disease state call. Spoke with patient regarding BP  Recent Office Vitals: BP Readings from Last 3 Encounters:  10/07/20 140/65  09/27/20 (!) 128/53  08/05/20 128/70   Pulse Readings from Last 3 Encounters:  10/07/20 75  09/27/20 77  08/05/20 73    Wt Readings from Last 3 Encounters:  10/07/20 150 lb 11.2 oz (68.4 kg)  09/27/20 150 lb 9.6 oz (68.3 kg)  08/05/20 148 lb 12.8 oz (67.5 kg)     Kidney Function Lab Results  Component Value Date/Time   CREATININE 0.80 07/29/2020 03:46 PM   CREATININE 0.87 03/23/2020 03:21 AM   CREATININE 0.82 01/20/2020 03:25 PM   CREATININE 0.8 02/16/2016 03:26 PM   CREATININE 0.8 07/01/2015 02:24 PM   GFR 65.57 07/29/2020 03:46 PM   GFRNONAA >60 03/23/2020 03:21 AM   GFRNONAA 64 01/20/2020 03:25 PM   GFRAA 74 01/20/2020 03:25 PM    BMP Latest Ref Rng & Units 07/29/2020 03/23/2020 01/20/2020  Glucose 70 -  99 mg/dL 98 90 95  BUN 6 - 23 mg/dL 17 22 23   Creatinine 0.40 - 1.20 mg/dL 5.40 9.81 1.91  BUN/Creat Ratio 6 - 22 (calc) - - NOT APPLICABLE  Sodium 135 - 145 mEq/L 142 142 141  Potassium 3.5 - 5.1 mEq/L 4.7 4.0 4.6  Chloride 96 - 112 mEq/L 102 103 103  CO2 19 - 32 mEq/L 33(H) 29 32  Calcium 8.4 - 10.5 mg/dL 47.8 9.7 9.2    Current antihypertensive regimen:  Losartan 25 mg 1 tab bid Metoprolol 25 mg 1/2 tab daily  How often are you checking your Blood Pressure? when feeling symptomatic  Current home BP readings: Patient states that on 10/08/20 bp was 120/63 , 09/30/20 it was 157/77, 09/23/20 bp was 144/74  What  recent interventions/DTPs have been made by any provider to improve Blood Pressure control since last CPP Visit: None noted  Any recent hospitalizations or ED visits since last visit with CPP? No  What diet changes have been made to improve Blood Pressure Control?  Patient states that she has a pretty good diet  What exercise is being done to improve your Blood Pressure Control?  Patient states that she does not exercise because of vascular problems and ankles hurt when she walks  Adherence Review: Is the patient currently on ACE/ARB medication? Yes Does the patient have >5 day gap between last estimated fill dates? No, patient states last filled at [facility] in May     Star Rating Drugs: Losartan  Velvet Bathe Clinical Pharmacist Assistant [phone]   Time spent:40

## 2020-10-30 ENCOUNTER — Other Ambulatory Visit: Payer: Self-pay | Admitting: Internal Medicine

## 2020-10-31 DIAGNOSIS — M48062 Spinal stenosis, lumbar region with neurogenic claudication: Secondary | ICD-10-CM | POA: Diagnosis not present

## 2020-11-02 ENCOUNTER — Other Ambulatory Visit: Payer: Self-pay | Admitting: Internal Medicine

## 2020-11-10 DIAGNOSIS — M545 Low back pain, unspecified: Secondary | ICD-10-CM | POA: Diagnosis not present

## 2020-11-11 ENCOUNTER — Other Ambulatory Visit: Payer: Self-pay | Admitting: Internal Medicine

## 2020-11-11 DIAGNOSIS — Z1231 Encounter for screening mammogram for malignant neoplasm of breast: Secondary | ICD-10-CM

## 2020-11-16 ENCOUNTER — Other Ambulatory Visit: Payer: Self-pay | Admitting: Internal Medicine

## 2020-11-20 ENCOUNTER — Other Ambulatory Visit: Payer: Self-pay | Admitting: Internal Medicine

## 2020-11-21 ENCOUNTER — Other Ambulatory Visit: Payer: Self-pay | Admitting: Internal Medicine

## 2020-11-28 DIAGNOSIS — M48062 Spinal stenosis, lumbar region with neurogenic claudication: Secondary | ICD-10-CM | POA: Diagnosis not present

## 2020-12-02 ENCOUNTER — Ambulatory Visit: Payer: Medicare HMO | Admitting: Internal Medicine

## 2020-12-05 DIAGNOSIS — H16223 Keratoconjunctivitis sicca, not specified as Sjogren's, bilateral: Secondary | ICD-10-CM | POA: Diagnosis not present

## 2020-12-05 DIAGNOSIS — H04123 Dry eye syndrome of bilateral lacrimal glands: Secondary | ICD-10-CM | POA: Diagnosis not present

## 2020-12-05 DIAGNOSIS — H401134 Primary open-angle glaucoma, bilateral, indeterminate stage: Secondary | ICD-10-CM | POA: Diagnosis not present

## 2020-12-05 DIAGNOSIS — H5319 Other subjective visual disturbances: Secondary | ICD-10-CM | POA: Diagnosis not present

## 2020-12-07 ENCOUNTER — Ambulatory Visit: Payer: Medicare HMO | Admitting: Internal Medicine

## 2020-12-07 DIAGNOSIS — M67911 Unspecified disorder of synovium and tendon, right shoulder: Secondary | ICD-10-CM | POA: Diagnosis not present

## 2020-12-12 DIAGNOSIS — M48062 Spinal stenosis, lumbar region with neurogenic claudication: Secondary | ICD-10-CM | POA: Diagnosis not present

## 2020-12-13 ENCOUNTER — Encounter: Payer: Self-pay | Admitting: Podiatry

## 2020-12-13 ENCOUNTER — Other Ambulatory Visit: Payer: Self-pay

## 2020-12-13 ENCOUNTER — Ambulatory Visit: Payer: Medicare HMO | Admitting: Podiatry

## 2020-12-13 DIAGNOSIS — B351 Tinea unguium: Secondary | ICD-10-CM | POA: Diagnosis not present

## 2020-12-13 DIAGNOSIS — M79675 Pain in left toe(s): Secondary | ICD-10-CM

## 2020-12-13 DIAGNOSIS — I739 Peripheral vascular disease, unspecified: Secondary | ICD-10-CM | POA: Diagnosis not present

## 2020-12-13 DIAGNOSIS — M79674 Pain in right toe(s): Secondary | ICD-10-CM | POA: Diagnosis not present

## 2020-12-14 ENCOUNTER — Encounter: Payer: Self-pay | Admitting: Internal Medicine

## 2020-12-14 ENCOUNTER — Ambulatory Visit (INDEPENDENT_AMBULATORY_CARE_PROVIDER_SITE_OTHER): Payer: Medicare HMO | Admitting: Internal Medicine

## 2020-12-14 VITALS — BP 120/72 | HR 72 | Temp 98.2°F | Ht 61.0 in | Wt 149.0 lb

## 2020-12-14 DIAGNOSIS — D693 Immune thrombocytopenic purpura: Secondary | ICD-10-CM | POA: Diagnosis not present

## 2020-12-14 DIAGNOSIS — I700 Atherosclerosis of aorta: Secondary | ICD-10-CM | POA: Diagnosis not present

## 2020-12-14 DIAGNOSIS — R7303 Prediabetes: Secondary | ICD-10-CM

## 2020-12-14 DIAGNOSIS — R300 Dysuria: Secondary | ICD-10-CM | POA: Diagnosis not present

## 2020-12-14 DIAGNOSIS — I10 Essential (primary) hypertension: Secondary | ICD-10-CM

## 2020-12-14 DIAGNOSIS — M48062 Spinal stenosis, lumbar region with neurogenic claudication: Secondary | ICD-10-CM | POA: Insufficient documentation

## 2020-12-14 DIAGNOSIS — R69 Illness, unspecified: Secondary | ICD-10-CM | POA: Diagnosis not present

## 2020-12-14 DIAGNOSIS — I2510 Atherosclerotic heart disease of native coronary artery without angina pectoris: Secondary | ICD-10-CM

## 2020-12-14 DIAGNOSIS — R600 Localized edema: Secondary | ICD-10-CM

## 2020-12-14 DIAGNOSIS — F32A Depression, unspecified: Secondary | ICD-10-CM

## 2020-12-14 DIAGNOSIS — F419 Anxiety disorder, unspecified: Secondary | ICD-10-CM

## 2020-12-14 NOTE — Assessment & Plan Note (Signed)
Chronic Check a1c Low sugar / carb diet 

## 2020-12-14 NOTE — Assessment & Plan Note (Signed)
Chronic Intolerant to statins Check lipids Unable to exercise Healthy diet

## 2020-12-14 NOTE — Assessment & Plan Note (Signed)
Chronic Controlled Continue lasix 10 mg qd prn

## 2020-12-14 NOTE — Assessment & Plan Note (Signed)
Chronic Controlled, stable Continue lexapro 2.5 mg daily  

## 2020-12-14 NOTE — Patient Instructions (Addendum)
  Blood work was ordered.     Medications changes include :  none     Please followup in 6 months   Wellness visit was 08/05/20

## 2020-12-14 NOTE — Assessment & Plan Note (Signed)
Chronic BP well controlled Continue losartan 25 mg qd, metoprolol 12. 5 mg qd cmp

## 2020-12-14 NOTE — Assessment & Plan Note (Signed)
Chronic Controlled Cbc today Continue prednisone 5 mg qd

## 2020-12-14 NOTE — Progress Notes (Signed)
Subjective:    Patient ID: Tamara Robertson, female    DOB: Mar 30, 1930, 87 y.o.   MRN: [id number]  HPI The patient is here for follow up of their chronic medical problems, including htn, CAD, prediabetes, ITP, anxiety/depression, spinal stenosis - to have injections this month  BP well controlled at home.    Still with heel bursitis.  Has chronic lower back pain with neurogenic claudication  Taking 1/2 lexparo.    R shoulder pain - had cortisone shot - helped minimally.  She has called ortho to f/u.     Medications and allergies reviewed with patient and updated if appropriate.  Patient Active Problem List   Diagnosis Date Noted   Aortic atherosclerosis (HCC) 12/14/2020   Spinal stenosis of lumbar region with neurogenic claudication 12/14/2020   Melanocytic nevi of trunk 10/05/2020   Peripheral venous insufficiency 10/05/2020   Rosacea 10/05/2020   Inflamed seborrheic keratosis 10/05/2020   Temporal arteritis (HCC) 10/05/2020   Viral warts 10/05/2020   Ear pain, left 06/23/2020   Palpitations 04/05/2020   Posterior neck pain 08/03/2019   Dizziness 08/03/2019   Diarrhea 03/13/2019   Nonrheumatic aortic valve stenosis 02/26/2019   Sensorineural hearing loss (SNHL), bilateral 01/30/2019   Chronic ITP (idiopathic thrombocytopenia) (HCC) 12/01/2018   Lightheadedness 11/19/2018   Epistaxis 11/19/2018   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   Rash 03/25/2018   Chronic left-sided headaches 01/28/2018   SBO (small bowel obstruction) (HCC) 09/20/2017   Aortic stenosis, moderate 08/22/2017   CAD (coronary artery disease) 08/21/2017   Pleural effusion on right    Streptococcal pneumonia (HCC) 06/26/2017   Community acquired pneumonia of right lower lobe of lung 06/25/2017   Current chronic use of systemic steroids 06/25/2017   Varicose veins of left lower extremity with complications 03/05/2017   Hypertension 07/31/2016   Leg edema 07/31/2016   Prediabetes 06/05/2016    Osteopenia 12/01/2015   Anxiety and depression 07/19/2015   Degenerative cervical disc 12/28/2014   Left ovarian cyst    Peripheral neuropathy 06/03/2012   PVD (peripheral vascular disease) (HCC) 11/23/2010   Bursitis of hip 10/26/2010   Hyperlipidemia 06/27/2010   Unspecified glaucoma 06/27/2010   GERD 06/27/2010   Irritable bowel syndrome 06/27/2010   OVERACTIVE BLADDER 06/27/2010   OSTEOARTHRITIS, KNEE, RIGHT 06/27/2010   URINARY INCONTINENCE 06/27/2010    Current Outpatient Medications on File Prior to Visit  Medication Sig Dispense Refill   acetaminophen (TYLENOL) 500 MG tablet Take 500 mg by mouth every 6 (six) hours as needed.     Biotin 2500 MCG CAPS Take 1 capsule by mouth daily.      calcium carbonate (TUMS - DOSED IN MG ELEMENTAL CALCIUM) 500 MG chewable tablet Chew 1 tablet by mouth daily as needed for indigestion or heartburn.     Calcium Citrate-Vitamin D (CITRACAL + D PO)      Calcium-Magnesium-Vitamin D 600-40-500 MG-MG-UNIT TB24 Take 1 tablet by mouth daily.     cycloSPORINE (RESTASIS) 0.05 % ophthalmic emulsion 1 drop 2 (two) times daily.     Elastic Bandages & Supports (MEDICAL COMPRESSION STOCKINGS) MISC See admin instructions.     escitalopram (LEXAPRO) 5 MG tablet TAKE ONE TABLET BY MOUTH ONE TIME DAILY 30 tablet 0   famotidine (PEPCID) 20 MG tablet Take 1 tablet (20 mg total) by mouth 2 (two) times daily as needed for heartburn or indigestion. 60 tablet 3   Flaxseed MISC Take 5 mLs by mouth daily. Take 1 tsp daily  furosemide (LASIX) 20 MG tablet Take 0.5 tablets (10 mg total) by mouth as needed. 45 tablet 2   Glucos-Chond-Hyal Ac-Ca Fructo (MOVE FREE JOINT HEALTH ADVANCE PO) Take by mouth.     losartan (COZAAR) 25 MG tablet Take 1 tablet by mouth twice daily 180 tablet 0   Magnesium Oxide 250 MG TABS Take by mouth.     metoprolol succinate (TOPROL-XL) 25 MG 24 hr tablet Take 0.5 tablets (12.5 mg total) by mouth daily. Take with or immediately following a  meal. 45 tablet 2   Multiple Vitamin (MULTIVITAMIN) tablet Take 1 tablet by mouth daily.       naproxen sodium (ALEVE) 220 MG tablet Take 220 mg by mouth as needed.      polyethylene glycol (MIRALAX / GLYCOLAX) packet Take 17 g by mouth daily as needed for moderate constipation.      POLYETHYLENE GLYCOL 3350 PO      predniSONE (DELTASONE) 2.5 MG tablet TAKE 1 TABLET BY MOUTH EVERY OTHER DAY IN THE MORNING 45 tablet 0   Probiotic Product (PROBIOTIC DAILY PO) Take by mouth.     Probiotic Product (PROBIOTIC PO)      psyllium (METAMUCIL) 58.6 % packet Take 1 packet by mouth daily as needed.     PSYLLIUM PO      trolamine salicylate (ASPERCREME) 10 % cream Apply 1 application topically as needed for muscle pain.     No current facility-administered medications on file prior to visit.    Past Medical History:  Diagnosis Date   Anxiety    CAD (coronary artery disease)    RCA 40% stenosis cath 01/2011   Cholelithiasis    s/p lap chole 09/2014   Diverticular stricture [facility]) 2006   [facility]   DIVERTICULITIS, HX OF    Diverticulosis    DYSLIPIDEMIA    Elevated LFTs    GERD    Glaucoma    Hepatic steatosis    HOH (hard of hearing)    Immune thrombocytopenic purpura (HCC)    chronic - baseline 80-100K, on pred   Irritable bowel syndrome    Left ovarian cyst dx 01/2013 CT   working with gyn, ?malignant - elevated tumor marker OVA1   OSTEOARTHRITIS, KNEE, RIGHT    OSTEOPENIA    OVERACTIVE BLADDER    Prolapse of female bladder, acquired 11/2017   UNSPECIFIED PERIPHERAL VASCULAR DISEASE    URINARY INCONTINENCE     Past Surgical History:  Procedure Laterality Date   ABDOMINAL HYSTERECTOMY  1963   ANGIOPLASTY     APPENDECTOMY  1956   CARDIAC CATHETERIZATION     CATARACT EXTRACTION, BILATERAL  10/2010   CHOLECYSTECTOMY N/A 09/16/2014   Procedure: LAPAROSCOPIC CHOLECYSTECTOMY ;  Surgeon: Emelia Loron, MD;  Location: [facility] OR;  Service: General;  Laterality: N/A;    KNEE ARTHROSCOPY Right    L pop PTA  10/2009   stent   LAPAROSCOPIC SIGMOID COLECTOMY  10/2005   SPLENECTOMY  1954   VARICOSE VEIN SURGERY Right 1962    Social History   Socioeconomic History   Marital status: Widowed    Spouse name: Not on file   Number of children: 2   Years of education: Not on file   Highest education level: Not on file  Occupational History   Occupation: Retired    Comment: Nutritional therapist office  Tobacco Use   Smoking status: Never   Smokeless tobacco: Never  Vaping Use   Vaping Use: Never used  Substance and Sexual Activity   Alcohol use: No    Alcohol/week: 0.0 standard drinks    Comment: rarely   Drug use: No   Sexual activity: Not Currently    Comment: 1st intercourse- 19, partners- 2,  widow  Other Topics Concern   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    [place] to [place] from [place] 05/2010 to be close to kids   Social Determinants of Health   Financial Resource Strain: Low Risk    Difficulty of Paying Living Expenses: Not hard at all  Food Insecurity: No Food Insecurity   Worried About Programme researcher, broadcasting/film/video in the Last Year: Never true   Barista in the Last Year: Never true  Transportation Needs: No Transportation Needs   Lack of Transportation (Medical): No   Lack of Transportation (Non-Medical): No  Physical Activity: Inactive   Days of Exercise per Week: 0 days   Minutes of Exercise per Session: 0 min  Stress: No Stress Concern Present   Feeling of Stress : Not at all  Social Connections: Moderately Integrated   Frequency of Communication with Friends and Family: More than three times a week   Frequency of Social Gatherings with Friends and Family: Once a week   Attends Religious Services: More than 4 times per year   Active Member of [employer] or Organizations: Yes   Attends Banker Meetings: More than 4 times per year   Marital Status: Widowed     Family History  Problem Relation Age of Onset   Coronary artery disease Mother    Heart attack Mother 19   Hyperlipidemia Mother    Hypertension Mother    Stomach cancer Father    Hypertension Daughter    Hyperlipidemia Daughter    Arthritis Other        parent   Transient ischemic attack Other        parent   Colon cancer Neg Hx     Review of Systems  Constitutional:  Negative for fever.  Respiratory:  Negative for cough, shortness of breath and wheezing.   Cardiovascular:  Positive for palpitations (occ) and leg swelling. Negative for chest pain.  Musculoskeletal:  Positive for arthralgias.  Neurological:  Positive for light-headedness (occ). Negative for headaches.      Objective:   Vitals:   12/14/20 1526  BP: 120/72  Pulse: 72  Temp: 98.2 F (36.8 C)  SpO2: 97%   BP Readings from Last 3 Encounters:  12/14/20 120/72  10/07/20 140/65  09/27/20 (!) 128/53   Wt Readings from Last 3 Encounters:  12/14/20 149 lb (67.6 kg)  10/07/20 150 lb 11.2 oz (68.4 kg)  09/27/20 150 lb 9.6 oz (68.3 kg)   Body mass index is 28.15 kg/m.   Physical Exam    Constitutional: Appears well-developed and well-nourished. No distress.  HENT:  Head: Normocephalic and atraumatic.  Neck: Neck supple. No tracheal deviation present. No thyromegaly present.  No cervical lymphadenopathy Cardiovascular: Normal rate, regular rhythm and normal heart sounds.   2/6 sys murmur heard. No carotid bruit .  No edema Pulmonary/Chest: Effort normal and breath sounds normal. No respiratory distress. No has no wheezes. No rales.  Skin: Skin is warm and dry. Not diaphoretic.  Psychiatric: Normal mood and affect. Behavior is normal.      Assessment & Plan:    See Problem List for Assessment and Plan of chronic  medical problems.    This visit occurred during the SARS-CoV-2 public health emergency.  Safety protocols were in place, including screening questions prior to the visit, additional  usage of staff PPE, and extensive cleaning of exam room while observing appropriate contact time as indicated for disinfecting solutions.

## 2020-12-14 NOTE — Assessment & Plan Note (Signed)
Chronic No symptoms c/w angina following with cardiology BP well controlled

## 2020-12-14 NOTE — Assessment & Plan Note (Signed)
Chronic Following with Dr Lynann Bologna Scheduled for spinal epidural

## 2020-12-15 LAB — COMPREHENSIVE METABOLIC PANEL
ALT: 17 U/L (ref 0–35)
AST: 20 U/L (ref 0–37)
Albumin: 4.1 g/dL (ref 3.5–5.2)
Alkaline Phosphatase: 63 U/L (ref 39–117)
BUN: 22 mg/dL (ref 6–23)
CO2: 32 mEq/L (ref 19–32)
Calcium: 9 mg/dL (ref 8.4–10.5)
Chloride: 100 mEq/L (ref 96–112)
Creatinine, Ser: 0.93 mg/dL (ref 0.40–1.20)
GFR: 54.58 mL/min — ABNORMAL LOW (ref >60.00)
Glucose, Bld: 76 mg/dL (ref 70–99)
Potassium: 4 mEq/L (ref 3.5–5.1)
Sodium: 137 mEq/L (ref 135–145)
Total Bilirubin: 0.4 mg/dL (ref 0.2–1.2)
Total Protein: 7.1 g/dL (ref 6.0–8.3)

## 2020-12-15 LAB — LIPID PANEL
Cholesterol: 190 mg/dL (ref 0–200)
HDL: 46 mg/dL (ref >39.00)
NonHDL: 144.01
Total CHOL/HDL Ratio: 4
Triglycerides: 262 mg/dL — ABNORMAL HIGH (ref 0.0–149.0)
VLDL: 52.4 mg/dL — ABNORMAL HIGH (ref 0.0–40.0)

## 2020-12-15 LAB — URINE CULTURE: Result:: NO GROWTH

## 2020-12-15 LAB — CBC WITH DIFFERENTIAL/PLATELET
Basophils Absolute: 0.2 10*3/uL — ABNORMAL HIGH (ref 0.0–0.1)
Basophils Relative: 1.3 % (ref 0.0–3.0)
Eosinophils Absolute: 0.5 10*3/uL (ref 0.0–0.7)
Eosinophils Relative: 3.9 % (ref 0.0–5.0)
HCT: 40.2 % (ref 36.0–46.0)
Hemoglobin: 13.2 g/dL (ref 12.0–15.0)
Lymphocytes Relative: 33.1 % (ref 12.0–46.0)
Lymphs Abs: 4.3 10*3/uL — ABNORMAL HIGH (ref 0.7–4.0)
MCHC: 32.8 g/dL (ref 30.0–36.0)
MCV: 87.2 fl (ref 78.0–100.0)
Monocytes Absolute: 1.2 10*3/uL — ABNORMAL HIGH (ref 0.1–1.0)
Monocytes Relative: 9.5 % (ref 3.0–12.0)
Neutro Abs: 6.8 10*3/uL (ref 1.4–7.7)
Neutrophils Relative %: 52.2 % (ref 43.0–77.0)
Platelets: 177 10*3/uL (ref 150.0–400.0)
RBC: 4.61 Mil/uL (ref 3.87–5.11)
RDW: 13.6 % (ref 11.5–15.5)
WBC: 12.9 10*3/uL — ABNORMAL HIGH (ref 4.0–10.5)

## 2020-12-15 LAB — URINALYSIS, ROUTINE W REFLEX MICROSCOPIC
Bilirubin Urine: NEGATIVE
Hgb urine dipstick: NEGATIVE
Ketones, ur: NEGATIVE
Leukocytes,Ua: NEGATIVE
Nitrite: NEGATIVE
RBC / HPF: NONE SEEN (ref 0–?)
Specific Gravity, Urine: <=1.005 — AB (ref 1.000–1.030)
Total Protein, Urine: NEGATIVE
Urine Glucose: NEGATIVE
Urobilinogen, UA: 0.2 (ref 0.0–1.0)
pH: 6 (ref 5.0–8.0)

## 2020-12-15 LAB — LDL CHOLESTEROL, DIRECT: Direct LDL: 118 mg/dL

## 2020-12-15 LAB — HEMOGLOBIN A1C: Hgb A1c MFr Bld: 5.8 % (ref 4.6–6.5)

## 2020-12-18 NOTE — Progress Notes (Signed)
Subjective: Tamara Robertson is a pleasant 87 y.o. female patient seen today for at risk foot care with h/o PAD. She is seen for painful thick toenails that are difficult to trim. Pain interferes with ambulation. Aggravating factors include wearing enclosed shoe gear. Pain is relieved with periodic professional debridement.  She has known PVD and is followed closely by [facility]. Last visit was 10/07/2020 .  She states today is her last visit due to her being unable to drive the distance to our office from the [facility] area.  She notes no new pedal problems on today's visit.   PCP is Binnie Rail, MD. Last visit was: 12/14/2020.  Allergies  Allergen Reactions   Aspirin Other (See Comments)    ITP   Sulfa Antibiotics Other (See Comments)    dizziness   Contrast Media [Iodinated Diagnostic Agents] Hives    CAT scan contrast only   Fluoxetine     Did not feel well on it    Other     Other reaction(s): Unknown   Celebrex [Celecoxib] Other (See Comments)    Leg swelling   Celexa [Citalopram] Other (See Comments)    Felt out of it   Statins Other (See Comments)    Objective: Physical Exam  General: Tamara Robertson is a pleasant 87 y.o. Caucasian female, WD, WN in NAD. AAO x 3.   Vascular:  Capillary refill time to digits immediate b/l. Faintly palpable DP pulse(s) b/l lower extremities. Faintly palpable PT pulse(s) b/l lower extremities. Pedal hair sparse. Lower extremity skin temperature gradient within normal limits. No edema noted b/l lower extremities.  Dermatological:  Pedal skin with normal turgor, texture and tone b/l lower extremities. No open wounds b/l lower extremities. No interdigital macerations b/l lower extremities. Toenails 1-5 b/l elongated, discolored, dystrophic, thickened, crumbly with subungual debris and tenderness to dorsal palpation. No hyperkeratotic nor porokeratotic lesions present on today's  visit.  Musculoskeletal:  Normal muscle strength 5/5 to all lower extremity muscle groups bilaterally. No pain crepitus or joint limitation noted with ROM b/l lower extremities. Hallux valgus with bunion deformity noted b/l lower extremities. Hammertoe(s) noted to the R 2nd toe.  Neurological:  Protective sensation intact 5/5 intact bilaterally with 10g monofilament b/l. Vibratory sensation intact b/l.  Assessment and Plan:  1. Pain due to onychomycosis of toenails of both feet   2. Peripheral vascular disease with claudication ([place])      -Examined patient. -Patient to continue soft, supportive shoe gear daily. -Toenails 1-5 b/l were debrided in length and girth with sterile nail nippers and dremel without iatrogenic bleeding.  -Patient to report any pedal injuries to medical professional immediately. -Patient/POA to call should there be question/concern in the interim.  Return in about 3 months (around 03/15/2021).  Marzetta Board, DPM

## 2020-12-28 DIAGNOSIS — M545 Low back pain, unspecified: Secondary | ICD-10-CM | POA: Diagnosis not present

## 2020-12-28 DIAGNOSIS — M25511 Pain in right shoulder: Secondary | ICD-10-CM | POA: Diagnosis not present

## 2020-12-28 DIAGNOSIS — M542 Cervicalgia: Secondary | ICD-10-CM | POA: Diagnosis not present

## 2021-01-03 DIAGNOSIS — M48062 Spinal stenosis, lumbar region with neurogenic claudication: Secondary | ICD-10-CM | POA: Diagnosis not present

## 2021-01-04 ENCOUNTER — Ambulatory Visit: Payer: Medicare HMO

## 2021-01-04 ENCOUNTER — Other Ambulatory Visit: Payer: Self-pay | Admitting: Student

## 2021-01-04 DIAGNOSIS — R002 Palpitations: Secondary | ICD-10-CM

## 2021-01-05 ENCOUNTER — Observation Stay (HOSPITAL_COMMUNITY)
Admission: EM | Admit: 2021-01-05 | Discharge: 2021-01-06 | Disposition: A | Discharge status: Home / Self Care | Payer: Medicare HMO | Attending: Family Medicine | Admitting: Family Medicine

## 2021-01-05 ENCOUNTER — Other Ambulatory Visit: Payer: Self-pay

## 2021-01-05 ENCOUNTER — Encounter (HOSPITAL_COMMUNITY): Payer: Self-pay | Admitting: Emergency Medicine

## 2021-01-05 ENCOUNTER — Emergency Department (HOSPITAL_COMMUNITY): Payer: Medicare HMO

## 2021-01-05 DIAGNOSIS — I480 Paroxysmal atrial fibrillation: Secondary | ICD-10-CM | POA: Diagnosis present

## 2021-01-05 DIAGNOSIS — Z7952 Long term (current) use of systemic steroids: Secondary | ICD-10-CM | POA: Diagnosis not present

## 2021-01-05 DIAGNOSIS — K219 Gastro-esophageal reflux disease without esophagitis: Secondary | ICD-10-CM | POA: Diagnosis not present

## 2021-01-05 DIAGNOSIS — R079 Chest pain, unspecified: Secondary | ICD-10-CM | POA: Diagnosis not present

## 2021-01-05 DIAGNOSIS — Z96651 Presence of right artificial knee joint: Secondary | ICD-10-CM | POA: Insufficient documentation

## 2021-01-05 DIAGNOSIS — I350 Nonrheumatic aortic (valve) stenosis: Secondary | ICD-10-CM | POA: Diagnosis not present

## 2021-01-05 DIAGNOSIS — I10 Essential (primary) hypertension: Secondary | ICD-10-CM | POA: Diagnosis present

## 2021-01-05 DIAGNOSIS — I4891 Unspecified atrial fibrillation: Secondary | ICD-10-CM | POA: Diagnosis not present

## 2021-01-05 DIAGNOSIS — Z20822 Contact with and (suspected) exposure to covid-19: Secondary | ICD-10-CM | POA: Insufficient documentation

## 2021-01-05 DIAGNOSIS — R002 Palpitations: Secondary | ICD-10-CM | POA: Diagnosis not present

## 2021-01-05 DIAGNOSIS — R0789 Other chest pain: Secondary | ICD-10-CM | POA: Diagnosis not present

## 2021-01-05 DIAGNOSIS — I499 Cardiac arrhythmia, unspecified: Secondary | ICD-10-CM | POA: Diagnosis not present

## 2021-01-05 DIAGNOSIS — Z743 Need for continuous supervision: Secondary | ICD-10-CM | POA: Diagnosis not present

## 2021-01-05 DIAGNOSIS — D693 Immune thrombocytopenic purpura: Secondary | ICD-10-CM | POA: Diagnosis not present

## 2021-01-05 DIAGNOSIS — I2510 Atherosclerotic heart disease of native coronary artery without angina pectoris: Secondary | ICD-10-CM | POA: Diagnosis not present

## 2021-01-05 DIAGNOSIS — Z79899 Other long term (current) drug therapy: Secondary | ICD-10-CM | POA: Diagnosis not present

## 2021-01-05 DIAGNOSIS — R0602 Shortness of breath: Secondary | ICD-10-CM | POA: Diagnosis not present

## 2021-01-05 LAB — CBC
HCT: 40.4 % (ref 36.0–46.0)
Hemoglobin: 13.4 g/dL (ref 12.0–15.0)
MCH: 28.9 pg (ref 26.0–34.0)
MCHC: 33.2 g/dL (ref 30.0–36.0)
MCV: 87.1 fL (ref 80.0–100.0)
Platelets: 128 10*3/uL — ABNORMAL LOW (ref 150–400)
RBC: 4.64 MIL/uL (ref 3.87–5.11)
RDW: 14.4 % (ref 11.5–15.5)
WBC: 18.7 10*3/uL — ABNORMAL HIGH (ref 4.0–10.5)
nRBC: 0 % (ref 0.0–0.2)

## 2021-01-05 LAB — BASIC METABOLIC PANEL
Anion gap: 8 (ref 5–15)
BUN: 20 mg/dL (ref 8–23)
CO2: 24 mmol/L (ref 22–32)
Calcium: 8.6 mg/dL — ABNORMAL LOW (ref 8.9–10.3)
Chloride: 109 mmol/L (ref 98–111)
Creatinine, Ser: 0.81 mg/dL (ref 0.44–1.00)
GFR, Estimated: >60 mL/min (ref >60)
Glucose, Bld: 147 mg/dL — ABNORMAL HIGH (ref 70–99)
Potassium: 4.1 mmol/L (ref 3.5–5.1)
Sodium: 141 mmol/L (ref 135–145)

## 2021-01-05 LAB — TROPONIN I (HIGH SENSITIVITY)
Troponin I (High Sensitivity): 31 ng/L — ABNORMAL HIGH (ref <18)
Troponin I (High Sensitivity): 38 ng/L — ABNORMAL HIGH (ref <18)

## 2021-01-05 LAB — PROTIME-INR
INR: 1 (ref 0.8–1.2)
Prothrombin Time: 13.5 seconds (ref 11.4–15.2)

## 2021-01-05 LAB — SARS CORONAVIRUS 2 (TAT 6-24 HRS): SARS Coronavirus 2: NEGATIVE

## 2021-01-05 LAB — APTT: aPTT: 23 seconds — ABNORMAL LOW (ref 24–36)

## 2021-01-05 MED ORDER — HEPARIN BOLUS VIA INFUSION
3000.0000 [IU] | Freq: Once | INTRAVENOUS | Status: AC
  Administered 2021-01-05: 3000 [IU] via INTRAVENOUS
  Filled 2021-01-05: qty 3000

## 2021-01-05 MED ORDER — SODIUM CHLORIDE 0.9 % IV SOLN
2.0000 g | INTRAVENOUS | Status: DC
  Filled 2021-01-05: qty 20

## 2021-01-05 MED ORDER — RISAQUAD PO CAPS
1.0000 | ORAL_CAPSULE | Freq: Every day | ORAL | Status: DC
  Administered 2021-01-06: 1 via ORAL
  Filled 2021-01-05: qty 1

## 2021-01-05 MED ORDER — AZITHROMYCIN 250 MG PO TABS
500.0000 mg | ORAL_TABLET | Freq: Every day | ORAL | Status: DC
  Administered 2021-01-06: 500 mg via ORAL
  Filled 2021-01-05: qty 2

## 2021-01-05 MED ORDER — HEPARIN (PORCINE) 25000 UT/250ML-% IV SOLN
800.0000 [IU]/h | INTRAVENOUS | Status: DC
  Administered 2021-01-05: 900 [IU]/h via INTRAVENOUS
  Filled 2021-01-05: qty 250

## 2021-01-05 MED ORDER — ACETAMINOPHEN 325 MG PO TABS
650.0000 mg | ORAL_TABLET | Freq: Four times a day (QID) | ORAL | Status: DC | PRN
  Administered 2021-01-06: 650 mg via ORAL
  Filled 2021-01-05: qty 2

## 2021-01-05 MED ORDER — FAMOTIDINE 20 MG PO TABS: 20.0000 mg | ORAL_TABLET | Freq: Two times a day (BID) | ORAL | Status: DC | PRN

## 2021-01-05 MED ORDER — ACETAMINOPHEN 650 MG RE SUPP: 650.0000 mg | Freq: Four times a day (QID) | RECTAL | Status: DC | PRN

## 2021-01-05 MED ORDER — ONDANSETRON HCL 4 MG PO TABS: 4.0000 mg | ORAL_TABLET | Freq: Four times a day (QID) | ORAL | Status: DC | PRN

## 2021-01-05 MED ORDER — SODIUM CHLORIDE 0.9% FLUSH
3.0000 mL | Freq: Two times a day (BID) | INTRAVENOUS | Status: DC
  Administered 2021-01-06: 3 mL via INTRAVENOUS

## 2021-01-05 MED ORDER — METOPROLOL SUCCINATE ER 25 MG PO TB24
12.5000 mg | ORAL_TABLET | Freq: Every day | ORAL | Status: DC
  Administered 2021-01-06: 12.5 mg via ORAL
  Filled 2021-01-05: qty 1

## 2021-01-05 MED ORDER — PREDNISONE 5 MG PO TABS
2.5000 mg | ORAL_TABLET | ORAL | Status: DC
  Administered 2021-01-06: 2.5 mg via ORAL
  Filled 2021-01-05: qty 1

## 2021-01-05 MED ORDER — ONDANSETRON HCL 4 MG/2ML IJ SOLN: 4.0000 mg | Freq: Four times a day (QID) | INTRAMUSCULAR | Status: DC | PRN

## 2021-01-05 MED ORDER — ACETAMINOPHEN 500 MG PO TABS
1000.0000 mg | ORAL_TABLET | Freq: Once | ORAL | Status: AC
  Administered 2021-01-05: 1000 mg via ORAL
  Filled 2021-01-05: qty 2

## 2021-01-05 MED ORDER — DILTIAZEM HCL-DEXTROSE 125-5 MG/125ML-% IV SOLN (PREMIX)
5.0000 mg/h | INTRAVENOUS | Status: DC
  Administered 2021-01-05: 5 mg/h via INTRAVENOUS
  Filled 2021-01-05: qty 125

## 2021-01-05 MED ORDER — CALCIUM CARBONATE ANTACID 500 MG PO CHEW: 1.0000 | CHEWABLE_TABLET | Freq: Every day | ORAL | Status: DC | PRN

## 2021-01-05 MED ORDER — AZITHROMYCIN 250 MG PO TABS
500.0000 mg | ORAL_TABLET | Freq: Once | ORAL | Status: AC
  Administered 2021-01-05: 500 mg via ORAL
  Filled 2021-01-05: qty 2

## 2021-01-05 MED ORDER — SODIUM CHLORIDE 0.9 % IV SOLN
1.0000 g | Freq: Once | INTRAVENOUS | Status: AC
  Administered 2021-01-05: 1 g via INTRAVENOUS
  Filled 2021-01-05: qty 10

## 2021-01-05 MED ORDER — ESCITALOPRAM OXALATE 10 MG PO TABS
5.0000 mg | ORAL_TABLET | Freq: Every day | ORAL | Status: DC
  Administered 2021-01-06: 5 mg via ORAL
  Filled 2021-01-05: qty 1

## 2021-01-05 MED ORDER — POLYETHYLENE GLYCOL 3350 17 G PO PACK: 17.0000 g | PACK | Freq: Every day | ORAL | Status: DC | PRN

## 2021-01-05 NOTE — Progress Notes (Signed)
Pt's HR dropping below 60 BPM and @ 2150 there was a 2.5 sec pause. Gaynelle Arabian, MD made aware. Titrated Cardizem gtt to 2.5 mL/hr. Will continue to monitor.   Elaina Hoops, RN

## 2021-01-05 NOTE — Progress Notes (Signed)
Pt converted to NSR @ 2313, 12-lead EKG obtained and Gaynelle Arabian, MD made aware. Verbal orders received to discontinue Cardizem gtt. Will continue to monitor.   Elaina Hoops, RN

## 2021-01-05 NOTE — H&P (Signed)
Cardiology Consultation:   Patient ID: Tamara Robertson; [id number]; [birth year]   Admit date: 01/05/2021 Date of Consult: 01/05/2021  Primary Care Provider: Pincus Sanes, MD Primary Cardiologist: Rollene Rotunda, MD  Primary Electrophysiologist:  None   Patient Profile:   Tamara Robertson is a 87 y.o. female with a hx of non obstructive CAD and hypertension who is being seen today for the evaluation of atrial fibrillation at the request of Dr. Jarvis Newcomer.  History of Present Illness:   Tamara Robertson has a history of nonobstructive coronary artery disease.  I have managed her for somewhat difficult to control hypertension.  She called EMS because of palpitations.  Her initial heart rate was in the 160s.  EKG in the emergency room shows atrial fibrillation.  There is a question of pulmonary infiltrate and so she has been given antibiotics in the emergency room.  He has been started on antibiotics.  She has been started on IV Cardizem.  She reports that she been doing okay.  She is had problems with her back and her shoulder.  She has not had any cough fevers or chills.  However, today she started noticing a little bit of chest discomfort and an irregular racing heart.  She took her blood pressure and her pulse was elevated.  She did not have any presyncope or syncope.  He has not had any new shortness of breath, PND or orthopnea.  She never really had this sensation before and this was different than the infrequent palpitation she has had in the past.  Of note she has had echocardiogram in the past with an EF of 60 to 65%.  She had some moderate stenosis of her aortic valve.  This was in 2019.  She had some mild to moderate mitral regurgitation.   Past Medical History:  Diagnosis Date   Anxiety    CAD (coronary artery disease)    RCA 40% stenosis cath 01/2011   Cholelithiasis    s/p lap chole 09/2014   Diverticular stricture [facility]) 2006   [facility]   DIVERTICULITIS, HX OF     Diverticulosis    DYSLIPIDEMIA    Elevated LFTs    GERD    Glaucoma    Hepatic steatosis    HOH (hard of hearing)    Immune thrombocytopenic purpura (HCC)    chronic - baseline 80-100K, on pred   Irritable bowel syndrome    Left ovarian cyst dx 01/2013 CT   working with gyn, ?malignant - elevated tumor marker OVA1   OSTEOARTHRITIS, KNEE, RIGHT    OSTEOPENIA    OVERACTIVE BLADDER    Prolapse of female bladder, acquired 11/2017   URINARY INCONTINENCE     Past Surgical History:  Procedure Laterality Date   ABDOMINAL HYSTERECTOMY  1963   APPENDECTOMY  1956   CARDIAC CATHETERIZATION     CATARACT EXTRACTION, BILATERAL  10/2010   CHOLECYSTECTOMY N/A 09/16/2014   Procedure: LAPAROSCOPIC CHOLECYSTECTOMY ;  Surgeon: Emelia Loron, MD;  Location: [facility] OR;  Service: General;  Laterality: N/A;   KNEE ARTHROSCOPY Right    L pop PTA  10/2009   stent   LAPAROSCOPIC SIGMOID COLECTOMY  10/2005   SPLENECTOMY  1954   VARICOSE VEIN SURGERY Right 1962     Home Medications:  Prior to Admission medications   Medication Sig Start Date End Date Taking? Authorizing Provider  acetaminophen (TYLENOL) 500 MG tablet Take 500 mg by mouth every 6 (six) hours as needed.  Yes [provider]  Biotin 2500 MCG CAPS Take 1 capsule by mouth daily.    Yes [provider]  calcium carbonate (TUMS - DOSED IN MG ELEMENTAL CALCIUM) 500 MG chewable tablet Chew 1 tablet by mouth daily as needed for indigestion or heartburn.   Yes [provider]  cycloSPORINE (RESTASIS) 0.05 % ophthalmic emulsion 1 drop 2 (two) times daily.   Yes [provider]  escitalopram (LEXAPRO) 5 MG tablet TAKE ONE TABLET BY MOUTH ONE TIME DAILY Patient taking differently: Take 5 mg by mouth daily. 11/17/20  Yes Burns, Bobette Mo, MD  famotidine (PEPCID) 20 MG tablet Take 1 tablet (20 mg total) by mouth 2 (two) times daily as needed for heartburn or indigestion. 04/13/19  Yes Pyrtle, Carie Caddy, MD  Flaxseed  MISC Take 5 mLs by mouth daily. Take 1 tsp daily   Yes [provider]  furosemide (LASIX) 20 MG tablet Take 0.5 tablets (10 mg total) by mouth as needed. 01/20/20  Yes Burns, Bobette Mo, MD  Glucos-Chond-Hyal Ac-Ca Fructo (MOVE FREE JOINT HEALTH ADVANCE PO) Take by mouth.   Yes [provider]  losartan (COZAAR) 25 MG tablet Take 1 tablet by mouth twice daily Patient taking differently: daily. Patient takes by mouth once a day instead of twice 11/21/20  Yes Burns, Bobette Mo, MD  Magnesium Oxide 250 MG TABS Take 1 tablet by mouth. 09/22/09  Yes [provider]  metoprolol succinate (TOPROL-XL) 25 MG 24 hr tablet TAKE 1/2 (ONE-HALF) TABLET BY MOUTH ONCE DAILY WITH OR IMMEDIATELY FOLLOWING A MEAL Patient taking differently: Take 12.5 mg by mouth daily. 01/04/21  Yes Rollene Rotunda, MD  Multiple Vitamin (MULTIVITAMIN) tablet Take 1 tablet by mouth daily.     Yes [provider]  polyethylene glycol (MIRALAX / GLYCOLAX) packet Take 17 g by mouth daily as needed for moderate constipation.    Yes [provider]  predniSONE (DELTASONE) 2.5 MG tablet TAKE 1 TABLET BY MOUTH EVERY OTHER DAY IN THE MORNING Patient taking differently: Take 2.5 mg by mouth See admin instructions. Every other day, in the morning 11/22/20  Yes Etta Grandchild, MD  psyllium (METAMUCIL) 58.6 % packet Take 1 packet by mouth daily as needed.   Yes [provider]  PSYLLIUM PO Take 1 tablet by mouth every other day.   Yes [provider]  trolamine salicylate (ASPERCREME) 10 % cream Apply 1 application topically as needed for muscle pain.   Yes [provider]  Elastic Bandages & Supports (MEDICAL COMPRESSION STOCKINGS) MISC See admin instructions. 07/14/19   [provider]  Probiotic Product (PROBIOTIC DAILY PO) Take 1 tablet by mouth daily with breakfast.    [provider]    Inpatient Medications: Scheduled Meds:  Continuous Infusions:   cefTRIAXone (ROCEPHIN)  IV 1 g (01/05/21 1625)   diltiazem (CARDIZEM) infusion 7.5 mg/hr (01/05/21 1623)   PRN Meds:   Allergies:    Allergies  Allergen Reactions   Aspirin Other (See Comments)    ITP   Sulfa Antibiotics Other (See Comments)    dizziness   Contrast Media [Iodinated Diagnostic Agents] Hives    CAT scan contrast only   Fluoxetine     Did not feel well on it    Celebrex [Celecoxib] Other (See Comments)    Leg swelling   Celexa [Citalopram] Other (See Comments)    Felt out of it   Statins Other (See Comments)    Social History:   Social  History   Socioeconomic History   Marital status: Widowed    Spouse name: Not on file   Number of children: 2   Years of education: Not on file   Highest education level: Not on file  Occupational History   Occupation: Retired    Comment: Nutritional therapist office  Tobacco Use   Smoking status: Never   Smokeless tobacco: Never  Vaping Use   Vaping Use: Never used  Substance and Sexual Activity   Alcohol use: No    Alcohol/week: 0.0 standard drinks    Comment: rarely   Drug use: No   Sexual activity: Not Currently    Comment: 1st intercourse- 19, partners- 2,  widow  Other Topics Concern   Not on file  Social History Narrative   Retired Futures trader.    [place] Ham to [place] from [place] 05/2010 to be close to kids   Social Determinants of Corporate investment banker Strain: Low Risk    Difficulty of Paying Living Expenses: Not hard at all  Food Insecurity: No Food Insecurity   Worried About Programme researcher, broadcasting/film/video in the Last Year: Never true   Barista in the Last Year: Never true  Transportation Needs: No Transportation Needs   Lack of Transportation (Medical): No   Lack of Transportation (Non-Medical): No  Physical Activity: Inactive   Days of Exercise per Week: 0 days   Minutes of Exercise per Session: 0 min  Stress: No Stress Concern Present   Feeling of Stress : Not at all  Social  Connections: Moderately Integrated   Frequency of Communication with Friends and Family: More than three times a week   Frequency of Social Gatherings with Friends and Family: Once a week   Attends Religious Services: More than 4 times per year   Active Member of [employer] or Organizations: Yes   Attends Banker Meetings: More than 4 times per year   Marital Status: Widowed  Catering manager Violence: Not on file    Family History:    Family History  Problem Relation Age of Onset   Coronary artery disease Mother    Heart attack Mother 51   Hyperlipidemia Mother    Hypertension Mother    Stomach cancer Father    Hypertension Daughter    Hyperlipidemia Daughter    Arthritis Other        parent   Transient ischemic attack Other        parent   Colon cancer Neg Hx      ROS:  Please see the history of present illness.  Positive for back pain, right shoulder pain All other ROS reviewed and negative.     Physical Exam/Data:   Vitals:   01/05/21 1503 01/05/21 1524 01/05/21 1545 01/05/21 1615  BP:   112/70 130/74  Pulse: (!) 103  (!) 46 (!) 136  Resp: 16  19 (!) 25  Temp:  98.1 F (36.7 C)    TempSrc:  Oral    SpO2: 93%  96% 96%   GENERAL: Well appearing HEENT:   Pupils equal round and reactive, fundi not visualized, oral mucosa unremarkable NECK:  No  jugular venous distention, waveform within normal limits, carotid upstroke brisk and symmetric, no bruits, no thyromegaly LYMPHATICS:  No cervical, inguinal adenopathy LUNGS: Diffuse crackles bilateral without wheezing BACK:  No CVA tenderness CHEST:   Unremarkable HEART:  PMI not displaced or sustained,S1 and S2 within normal limits, no S3,  no clicks, no rubs, 3 out of 6 apical systolic murmur radiating slightly at the aortic outflow tract, no diastolic murmurs, irregular ABD:  Flat, positive bowel sounds normal in frequency in pitch, no bruits, no rebound, no guarding, no midline pulsatile mass, no hepatomegaly,  no splenomegaly EXT:  2 plus pulses upper, 2+ dorsalis pedis and posterior tibialis on the left and decreased posterior tibialis and dorsalis pedis on the right, no  edema, no cyanosis no clubbing SKIN:  No rashes no nodules NEURO:   Cranial nerves II through XII grossly intact, motor grossly intact throughout PSYCH:    Cognitively intact, oriented to person place and time   EKG:  The EKG was personally reviewed and demonstrates: Atrial fibrillation, rate 116, no acute ST-T wave changes. Telemetry:  Telemetry was personally reviewed and demonstrates: Atrial fibrillation with rapid ventricular response  Relevant CV Studies: None  Laboratory Data:  Chemistry Recent Labs  Lab 01/05/21 1455  NA 141  K 4.1  CL 109  CO2 24  GLUCOSE 147*  BUN 20  CREATININE 0.81  CALCIUM 8.6*  GFRNONAA >60  ANIONGAP 8    No results for input(s): PROT, ALBUMIN, AST, ALT, ALKPHOS, BILITOT in the last 168 hours. Hematology Recent Labs  Lab 01/05/21 1455  WBC 18.7*  RBC 4.64  HGB 13.4  HCT 40.4  MCV 87.1  MCH 28.9  MCHC 33.2  RDW 14.4  PLT 128*   Cardiac EnzymesNo results for input(s): TROPONINI in the last 168 hours. No results for input(s): TROPIPOC in the last 168 hours.  BNPNo results for input(s): BNP, PROBNP in the last 168 hours.  DDimer No results for input(s): DDIMER in the last 168 hours.  Radiology/Studies:  DG Chest Portable 1 View  Result Date: 01/05/2021 CLINICAL DATA:  Shortness of breath and palpitations in a 87 year old female. EXAM: PORTABLE CHEST 1 VIEW COMPARISON:  Sep 20, 2017. FINDINGS: Trachea midline. Cardiomediastinal contours and hilar structures are stable. Lungs without lobar consolidation. No sign of pleural effusion. No visible pneumothorax. EKG leads project over the chest. On limited assessment there is no acute skeletal process. Subtle nodular opacities project over the upper lobes, for instance in the RIGHT upper lobe between the fifth and sixth ribs and in  the LEFT upper lobe posterior to the sixth rib. Some of these areas could represent confluence of shadows, this is not clear. IMPRESSION: Subtle nodular densities in the chest. Could represent developing infection, small pulmonary nodules not excluded, suggest PA and lateral chest radiograph in 6-8 weeks, particularly if there are symptoms of infection and there is an indication for a trial of antibiotic therapy. Electronically Signed   By: Donzetta Kohut M.D.   On: 01/05/2021 15:45    Assessment and Plan:   ATRIAL FIB: This is new onset.  I am going to start heparin today and perhaps if she has not cardioverted prior to discharge he might consider this without TEE since this is new onset.  We could wait till tomorrow to see if she is still in this rhythm.  I would not suggest that she would need long-term anticoagulation as this is the first event and there might be a trigger with pneumonia in addition to the fact that she would be higher risk for anticoagulation given her thrombocytopenia.  CAD (coronary artery disease): There is no evidence of ischemia.  No further work-up is suggested.  Aortic stenosis, moderate: This has been moderate and can be followed clinically.  Hypertension: The blood pressures are  currently controlled.  Further management will be based on the need for rate control medications for now determine a change in her antihypertensive regimen prior to discharge.   For questions or updates, please contact [facility] Please consult [email or web address] for contact info under Cardiology/STEMI.   Signed, Rollene Rotunda, MD  01/05/2021 4:43 PM

## 2021-01-05 NOTE — Progress Notes (Addendum)
ANTICOAGULATION CONSULT NOTE - Initial Consult  Pharmacy Consult for heparin Indication: atrial fibrillation  Allergies  Allergen Reactions   Aspirin Other (See Comments)    ITP   Sulfa Antibiotics Other (See Comments)    dizziness   Contrast Media [Iodinated Diagnostic Agents] Hives    CAT scan contrast only   Fluoxetine     Did not feel well on it    Celebrex [Celecoxib] Other (See Comments)    Leg swelling   Celexa [Citalopram] Other (See Comments)    Felt out of it   Statins Other (See Comments)    Patient Measurements:   Heparin Dosing Weight: 62.1 kg  Vital Signs: Temp: 98.1 F (36.7 C) (09/01 1524) Temp Source: Oral (09/01 1524) BP: 130/74 (09/01 1615) Pulse Rate: 136 (09/01 1615)  Labs: Recent Labs    01/05/21 1455  HGB 13.4  HCT 40.4  PLT 128*  LABPROT 13.5  INR 1.0  CREATININE 0.81  TROPONINIHS 31*    CrCl cannot be calculated (Unknown ideal weight.).   Medical History: Past Medical History:  Diagnosis Date   Anxiety    CAD (coronary artery disease)    RCA 40% stenosis cath 01/2011   Diverticular stricture [facility]) 2006   [place], HX OF    Diverticulosis    DYSLIPIDEMIA    Elevated LFTs    GERD    Glaucoma    Hepatic steatosis    HOH (hard of hearing)    Immune thrombocytopenic purpura ([place])    chronic - baseline 80-100K, on pred   Irritable bowel syndrome    Left ovarian cyst dx 01/2013 CT   working with gyn, ?malignant - elevated tumor marker OVA1   OSTEOARTHRITIS, KNEE, RIGHT    OSTEOPENIA    OVERACTIVE BLADDER    Prolapse of female bladder, acquired 11/2017   URINARY INCONTINENCE     Medications:  Scheduled:  Infusions:   diltiazem (CARDIZEM) infusion 7.5 mg/hr (01/05/21 1623)    Assessment: 87 yo female presenting for evaluation of new atrial fibrillation. Pharmacy consulted for heparin dosing.  No AC prior to admission - of note, patient has history of ITP. CBC stable.   Goal of  Therapy:  Heparin level 0.3-0.7 units/ml Monitor platelets by anticoagulation protocol: Yes   Plan:  Give 3000 units bolus Start heparin infusion 900 units/hr 8 hour heparin level Monitor daily CBC, HL, and signs/symptoms of bleeding  Laurey Arrow, PharmD PGY1 Pharmacy Resident 01/05/2021  5:29 PM  Please check [email or web address] for unit-specific pharmacy phone numbers.

## 2021-01-05 NOTE — ED Provider Notes (Signed)
[facility] EMERGENCY DEPARTMENT Provider Note   CSN: [id number] Arrival date & time: 01/05/21  1448     History Chief Complaint  Patient presents with   Palpitations    Tamara Robertson is a 87 y.o. female.  The history is provided by the patient and medical records. No language interpreter was used.  Palpitations.  87 year old female significant history of ITP, CAD, anxiety, brought here via EMS from home with complaints of heart palpitation.  Patient states she woke up around 11 AM this morning and felt her heart was racing.  She also experiencing some chest pressure associate with and lightheadedness.  Subsequently patient decided to call EMS.  When EMS arrived, it was noted that her heart rate was 160 and she was in A. fib.  Patient was given 20 mg of Cardizem with improvement.  At this time patient states she feels back to her normal self.  She denies any change in medication, denies any change in her diet, no increase in caffeine use.  She has never been diagnosed with A. fib in the past.  Due to ITP she is not on any blood thinner medication.  She did report little early but more anxious as she is preparing for a wedding for grandchildren.  Aside from that, she cannot attribute her symptoms to anything else.  No prior history of PE or DVT.  Patient felt fine yesterday.  Patient has a cardiologist, Dr. Percival Spanish.  Past Medical History:  Diagnosis Date   Anxiety    CAD (coronary artery disease)    RCA 40% stenosis cath 01/2011   Cholelithiasis    s/p lap chole 09/2014   Diverticular stricture [facility]) 2006   [place], HX OF    Diverticulosis    DYSLIPIDEMIA    Elevated LFTs    GERD    Glaucoma    Hepatic steatosis    HOH (hard of hearing)    Immune thrombocytopenic purpura ([place])    chronic - baseline 80-100K, on pred   Irritable bowel syndrome    Left ovarian cyst dx 01/2013 CT   working with gyn, ?malignant -  elevated tumor marker OVA1   OSTEOARTHRITIS, KNEE, RIGHT    OSTEOPENIA    OVERACTIVE BLADDER    Prolapse of female bladder, acquired 11/2017   UNSPECIFIED PERIPHERAL VASCULAR DISEASE    URINARY INCONTINENCE     Patient Active Problem List   Diagnosis Date Noted   Aortic atherosclerosis ([place]) 12/14/2020   Spinal stenosis of lumbar region with neurogenic claudication 12/14/2020   Melanocytic nevi of trunk 10/05/2020   Peripheral venous insufficiency 10/05/2020   Rosacea 10/05/2020   Inflamed seborrheic keratosis 10/05/2020   Temporal arteritis ([place]) 10/05/2020   Viral warts 10/05/2020   Ear pain, left 06/23/2020   Palpitations 04/05/2020   Posterior neck pain 08/03/2019   Dizziness 08/03/2019   Diarrhea 03/13/2019   Nonrheumatic aortic valve stenosis 02/26/2019   Sensorineural hearing loss (SNHL), bilateral 01/30/2019   Chronic ITP (idiopathic thrombocytopenia) ([place]) 12/01/2018   Lightheadedness 11/19/2018   Epistaxis 11/19/2018   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   Rash 03/25/2018   Chronic left-sided headaches 01/28/2018   SBO (small bowel obstruction) ([place]) 09/20/2017   Aortic stenosis, moderate 08/22/2017   CAD (coronary artery disease) 08/21/2017   Pleural effusion on right    Streptococcal pneumonia ([place]) 06/26/2017   Community acquired pneumonia of right lower lobe of lung 06/25/2017   Current chronic use of systemic steroids  06/25/2017   Varicose veins of left lower extremity with complications [id number]   Hypertension 07/31/2016   Leg edema 07/31/2016   Prediabetes 06/05/2016   Osteopenia 12/01/2015   Anxiety and depression 07/19/2015   Degenerative cervical disc 12/28/2014   Left ovarian cyst    Peripheral neuropathy 06/03/2012   PVD (peripheral vascular disease) ([place]) 11/23/2010   Bursitis of hip 10/26/2010   Hyperlipidemia 06/27/2010   Unspecified glaucoma 06/27/2010   GERD 06/27/2010   Irritable bowel syndrome 06/27/2010   OVERACTIVE BLADDER  06/27/2010   OSTEOARTHRITIS, KNEE, RIGHT 06/27/2010   URINARY INCONTINENCE 06/27/2010    Past Surgical History:  Procedure Laterality Date   ABDOMINAL HYSTERECTOMY  1963   ANGIOPLASTY     APPENDECTOMY  1956   CARDIAC CATHETERIZATION     CATARACT EXTRACTION, BILATERAL  10/2010   CHOLECYSTECTOMY N/A 09/16/2014   Procedure: LAPAROSCOPIC CHOLECYSTECTOMY ;  Surgeon: Rolm Bookbinder, MD;  Location: [facility] OR;  Service: General;  Laterality: N/A;   KNEE ARTHROSCOPY Right    L pop PTA  10/2009   stent   LAPAROSCOPIC SIGMOID COLECTOMY  10/2005   SPLENECTOMY  1954   VARICOSE VEIN SURGERY Right 1962     OB History     Gravida  3   Para  3   Term      Preterm      AB      Living  2      SAB      IAB      Ectopic      Multiple      Live Births           Obstetric Comments  1 child passed away at 31         Family History  Problem Relation Age of Onset   Coronary artery disease Mother    Heart attack Mother 62   Hyperlipidemia Mother    Hypertension Mother    Stomach cancer Father    Hypertension Daughter    Hyperlipidemia Daughter    Arthritis Other        parent   Transient ischemic attack Other        parent   Colon cancer Neg Hx     Social History   Tobacco Use   Smoking status: Never   Smokeless tobacco: Never  Vaping Use   Vaping Use: Never used  Substance Use Topics   Alcohol use: No    Alcohol/week: 0.0 standard drinks    Comment: rarely   Drug use: No    Home Medications Prior to Admission medications   Medication Sig Start Date End Date Taking? Authorizing Provider  acetaminophen (TYLENOL) 500 MG tablet Take 500 mg by mouth every 6 (six) hours as needed.    [provider]  Biotin 2500 MCG CAPS Take 1 capsule by mouth daily.     [provider]  calcium carbonate (TUMS - DOSED IN MG ELEMENTAL CALCIUM) 500 MG chewable tablet Chew 1 tablet by mouth daily as needed for indigestion or heartburn.    [provider]  Calcium Citrate-Vitamin D (CITRACAL + D PO)     [provider]  Calcium-Magnesium-Vitamin D 600-40-500 MG-MG-UNIT TB24 Take 1 tablet by mouth daily.    [provider]  cycloSPORINE (RESTASIS) 0.05 % ophthalmic emulsion 1 drop 2 (two) times daily.    [provider]  Elastic Bandages & Supports (MEDICAL COMPRESSION STOCKINGS) [place] See admin instructions. 07/14/19   [provider]  escitalopram (LEXAPRO) 5 MG tablet TAKE ONE TABLET BY MOUTH ONE TIME DAILY 11/17/20   Binnie Rail, MD  famotidine (PEPCID) 20 MG tablet Take 1 tablet (20 mg total) by mouth 2 (two) times daily as needed for heartburn or indigestion. 04/13/19   Pyrtle, Lajuan Lines, MD  Flaxseed MISC Take 5 mLs by mouth daily. Take 1 tsp daily    [provider]  furosemide (LASIX) 20 MG tablet Take 0.5 tablets (10 mg total) by mouth as needed. 01/20/20   Binnie Rail, MD  Glucos-Chond-Hyal Ac-Ca Fructo (MOVE FREE JOINT HEALTH ADVANCE PO) Take by mouth.    [provider]  losartan (COZAAR) 25 MG tablet Take 1 tablet by mouth twice daily Patient taking differently: daily. Patient takes by mouth once a day instead of twice 11/21/20   Binnie Rail, MD  Magnesium Oxide 250 MG TABS Take by mouth. 09/22/09   [provider]  metoprolol succinate (TOPROL-XL) 25 MG 24 hr tablet TAKE 1/2 (ONE-HALF) TABLET BY MOUTH ONCE DAILY WITH OR IMMEDIATELY FOLLOWING A MEAL 01/04/21   [name], MD  Multiple Vitamin (MULTIVITAMIN) tablet Take 1 tablet by mouth daily.      [provider]  naproxen sodium (ALEVE) 220 MG tablet Take 220 mg by mouth as needed.     [provider]  polyethylene glycol (MIRALAX / GLYCOLAX) packet Take 17 g by mouth daily as needed for moderate constipation.     [provider]  POLYETHYLENE GLYCOL 3350 PO     [provider]  predniSONE (DELTASONE) 2.5 MG tablet TAKE 1 TABLET BY MOUTH EVERY OTHER DAY IN THE MORNING 11/22/20    Janith Lima, MD  Probiotic Product (PROBIOTIC DAILY PO) Take by mouth.    [provider]  Probiotic Product (PROBIOTIC PO)     [provider]  psyllium (METAMUCIL) 58.6 % packet Take 1 packet by mouth daily as needed.    [provider]  PSYLLIUM PO     [provider]  trolamine salicylate (ASPERCREME) 10 % cream Apply 1 application topically as needed for muscle pain.    [provider]    Allergies    Aspirin, Sulfa antibiotics, Contrast media [iodinated diagnostic agents], Fluoxetine, Other, Celebrex [celecoxib], Celexa [citalopram], and Statins  Review of Systems   Review of Systems  Cardiovascular:  Positive for palpitations.  All other systems reviewed and are negative.  Physical Exam Updated Vital Signs BP 132/69   Pulse (!) 103   Resp 16   SpO2 93%   Physical Exam Vitals and nursing note reviewed.  Constitutional:      General: She is not in acute distress.    Appearance: She is well-developed.  HENT:     Head: Atraumatic.  Eyes:     Conjunctiva/sclera: Conjunctivae normal.  Cardiovascular:     Rate and Rhythm: Rhythm irregular.  Pulmonary:     Effort: Pulmonary effort is normal.  Abdominal:     Palpations: Abdomen is soft.     Tenderness: There is no abdominal tenderness.  Musculoskeletal:     Cervical back: Neck supple.     Right lower leg: No edema.     Left lower leg: No edema.  Skin:    Findings: No rash.  Neurological:     Mental Status: She is alert and oriented to person, place, and time.  Psychiatric:        Mood and Affect: Mood normal.    ED Results /  Procedures / Treatments   Labs (all labs ordered are listed, but only abnormal results are displayed) Labs Reviewed  BASIC METABOLIC PANEL - Abnormal; Notable for the following components:      Result Value   Glucose, Bld 147 (*)    Calcium 8.6 (*)    All other components within normal limits  CBC - Abnormal; Notable for the following  components:   WBC 18.7 (*)    Platelets 128 (*)    All other components within normal limits  TROPONIN I (HIGH SENSITIVITY) - Abnormal; Notable for the following components:   Troponin I (High Sensitivity) 31 (*)    All other components within normal limits  SARS CORONAVIRUS 2 (TAT 6-24 HRS)  PROTIME-INR    EKG EKG Interpretation  Date/Time:  Thursday January 05 2021 14:51:08 EDT Ventricular Rate:  116 PR Interval:    QRS Duration: 79 QT Interval:  332 QTC Calculation: 462 R Axis:   -9 Text Interpretation: Atrial fibrillation New since previous tracing Confirmed by Isla Pence [phone]) on 01/05/2021 3:22:25 PM  Radiology DG Chest Portable 1 View  Result Date: 01/05/2021 CLINICAL DATA:  Shortness of breath and palpitations in a 87 year old female. EXAM: PORTABLE CHEST 1 VIEW COMPARISON:  Sep 20, 2017. FINDINGS: Trachea midline. Cardiomediastinal contours and hilar structures are stable. Lungs without lobar consolidation. No sign of pleural effusion. No visible pneumothorax. EKG leads project over the chest. On limited assessment there is no acute skeletal process. Subtle nodular opacities project over the upper lobes, for instance in the RIGHT upper lobe between the fifth and sixth ribs and in the LEFT upper lobe posterior to the sixth rib. Some of these areas could represent confluence of shadows, this is not clear. IMPRESSION: Subtle nodular densities in the chest. Could represent developing infection, small pulmonary nodules not excluded, suggest PA and lateral chest radiograph in 6-8 weeks, particularly if there are symptoms of infection and there is an indication for a trial of antibiotic therapy. Electronically Signed   By: Zetta Bills M.D.   On: 01/05/2021 15:45    Procedures .Critical Care  Date/Time: 01/05/2021 4:26 PM Performed by: Domenic Moras, PA-C Authorized by: Domenic Moras, PA-C   Critical care provider statement:    Critical care time (minutes):  35   Critical  care was time spent personally by me on the following activities:  Discussions with consultants, evaluation of patient's response to treatment, examination of patient, ordering and performing treatments and interventions, ordering and review of laboratory studies, ordering and review of radiographic studies, pulse oximetry, re-evaluation of patient's condition, obtaining history from patient or surrogate and review of old charts   Medications Ordered in ED Medications  diltiazem (CARDIZEM) 125 mg in dextrose 5% 125 mL (1 mg/mL) infusion (7.5 mg/hr Intravenous Rate/Dose Change 01/05/21 1623)  cefTRIAXone (ROCEPHIN) 1 g in sodium chloride 0.9 % 100 mL IVPB (1 g Intravenous New Bag/Given 01/05/21 1625)  acetaminophen (TYLENOL) tablet 1,000 mg (1,000 mg Oral Given 01/05/21 1625)  azithromycin (ZITHROMAX) tablet 500 mg (500 mg Oral Given 01/05/21 1625)    ED Course  I have reviewed the triage vital signs and the nursing notes.  Pertinent labs & imaging results that were available during my care of the patient were reviewed by me and considered in my medical decision making (see chart for details).    MDM Rules/Calculators/A&P  BP 112/70   Pulse (!) 46   Temp 98.1 F (36.7 C) (Oral)   Resp 19   SpO2 96%   Final Clinical Impression(s) / ED Diagnoses Final diagnoses:  Atrial fibrillation with RVR ([place])    Rx / DC Orders ED Discharge Orders     None      This is an elderly female who is here with heart palpitation.  EKG shows atrial fibrillation with RVR.  Patient did receive 20 mg of Cardizem via EMS and heart rate in the room at this time is 97.  She is overall well-appearing, blood pressure stable, no provocation.  She does have a CHA2DS2-VASc score of 4 however she does have history of ITP and likely not a candidate for anticoagulant.  4:04 PM Labs remarkable for an early white count of 18.7.  Platelet is 128.A chest x-ray that shows a subtle nodular density in  the chest that could represent a developing infection, a small pulmonary nodule is not excluded.  In the setting of atrial fibrillation and potential lung infection, I will order antibiotic to cover for community-acquired pneumonia.  Patient does have a mildly elevated troponin of 31 likely secondary to demand ischemia from ongoing atrial fibrillation with RVR.  Patient is placed in a Cardizem drip.  Care discussed with Dr. Gilford Raid.   4:15 PM Appreciate consultation from cardiology (spoke with Levada Dy, [place]) who will be involve in her care.  Will consult medicine for admission.   4:24 PM Appreciate consultation from [facility] Dr. Bonner Puna who agrees to see and will admit pt for further care.    Domenic Moras, PA-C 01/05/21 1628    Isla Pence, MD 01/05/21 [phone]

## 2021-01-05 NOTE — ED Triage Notes (Signed)
Pt from home via [facility] with C/O palpitations. EMS reports HR of 160 upon arrival. Pt was given '20mg'$  Cardizem.

## 2021-01-05 NOTE — H&P (Signed)
History and Physical   Tamara Robertson [id number] DOB: [birth year] DOA: 01/05/2021  Referring MD/NP/PA: Fayrene Helper, PA, EDP PCP: Pincus Sanes, MD Outpatient Specialists: [facility]  Patient coming from: Home  Chief Complaint: Palpitations  HPI: Tamara Robertson is an active 87 y.o. female with a history of chronic ITP on prednisone, CAD, HTN, depression who presented to the ED after activating EMS due to palpitations when she was found to have HR in 160's, down to 100's with diltiazem 20mg  IV by arrival in ED where ECG confirmed AFib with RVR. She describes some intermittent palpitations fleetingly but never anything like this before. There was no chest pain, dyspnea, abd pain N/V/D. She did have steroid injection in her back and recently took a prednisone taper 3 weeks - 2 weeks ago. She has a mild nonproductive cough that is not new, no sputum production, wheezing, fever, chills, sick contacts. She did have pneumonia rather severely years ago.    ED Course: Started on diltiazem gtt with improvement in HR. Troponins 31 > 38 without chest pain. WBC 18.7k (from mildly elevated baseline), platelets 128k, hgb normal at 13.7. Covid test pending, though CXR demonstrates subtle nodularity. Ceftriaxone and azithromycin were started and hospitalists consulted for admission.   Review of Systems: No orthopnea, PND, and per HPI. All others reviewed and are negative.   Past Medical History:  Diagnosis Date   Anxiety    CAD (coronary artery disease)    RCA 40% stenosis cath 01/2011   Diverticular stricture [facility]) 2006   [facility]   DIVERTICULITIS, HX OF    Diverticulosis    DYSLIPIDEMIA    Elevated LFTs    GERD    Glaucoma    Hepatic steatosis    HOH (hard of hearing)    Immune thrombocytopenic purpura (HCC)    chronic - baseline 80-100K, on pred   Irritable bowel syndrome    Left ovarian cyst dx 01/2013 CT   working with gyn, ?malignant - elevated tumor marker  OVA1   OSTEOARTHRITIS, KNEE, RIGHT    OSTEOPENIA    OVERACTIVE BLADDER    Prolapse of female bladder, acquired 11/2017   URINARY INCONTINENCE    Past Surgical History:  Procedure Laterality Date   ABDOMINAL HYSTERECTOMY  1963   APPENDECTOMY  1956   CARDIAC CATHETERIZATION     CATARACT EXTRACTION, BILATERAL  10/2010   CHOLECYSTECTOMY N/A 09/16/2014   Procedure: LAPAROSCOPIC CHOLECYSTECTOMY ;  Surgeon: Emelia Loron, MD;  Location: [facility] OR;  Service: General;  Laterality: N/A;   KNEE ARTHROSCOPY Right    L pop PTA  10/2009   stent   LAPAROSCOPIC SIGMOID COLECTOMY  10/2005   SPLENECTOMY  1954   VARICOSE VEIN SURGERY Right 1962   - Nonsmoker, no EtOH or illicit drugs.   reports that she has never smoked. She has never used smokeless tobacco. She reports that she does not drink alcohol and does not use drugs. Allergies  Allergen Reactions   Aspirin Other (See Comments)    ITP   Sulfa Antibiotics Other (See Comments)    dizziness   Contrast Media [Iodinated Diagnostic Agents] Hives    CAT scan contrast only   Fluoxetine     Did not feel well on it    Celebrex [Celecoxib] Other (See Comments)    Leg swelling   Celexa [Citalopram] Other (See Comments)    Felt out of it   Statins Other (See Comments)   Family History  Problem Relation  Age of Onset   Coronary artery disease Mother    Heart attack Mother 60   Hyperlipidemia Mother    Hypertension Mother    Stomach cancer Father    Hypertension Daughter    Hyperlipidemia Daughter    Arthritis Other        parent   Transient ischemic attack Other        parent   Colon cancer Neg Hx    - Family history otherwise reviewed and not pertinent.  Prior to Admission medications   Medication Sig  acetaminophen (TYLENOL) 500 MG tablet Take 500 mg by mouth every 6 (six) hours as needed.  Biotin 2500 MCG CAPS Take 1 capsule by mouth daily.   calcium carbonate (TUMS - DOSED IN MG ELEMENTAL CALCIUM) 500 MG chewable tablet Chew 1  tablet by mouth daily as needed for indigestion or heartburn.  cycloSPORINE (RESTASIS) 0.05 % ophthalmic emulsion 1 drop 2 (two) times daily.  escitalopram (LEXAPRO) 5 MG tablet TAKE ONE TABLET BY MOUTH ONE TIME DAILY Patient taking differently: Take 5 mg by mouth daily.  famotidine (PEPCID) 20 MG tablet Take 1 tablet (20 mg total) by mouth 2 (two) times daily as needed for heartburn or indigestion.  Flaxseed MISC Take 5 mLs by mouth daily. Take 1 tsp daily  furosemide (LASIX) 20 MG tablet Take 0.5 tablets (10 mg total) by mouth as needed.  Glucos-Chond-Hyal Ac-Ca Fructo (MOVE FREE JOINT HEALTH ADVANCE PO) Take by mouth.  losartan (COZAAR) 25 MG tablet Take 1 tablet by mouth twice daily Patient taking differently: daily. Patient takes by mouth once a day instead of twice  Magnesium Oxide 250 MG TABS Take 1 tablet by mouth.  metoprolol succinate (TOPROL-XL) 25 MG 24 hr tablet TAKE 1/2 (ONE-HALF) TABLET BY MOUTH ONCE DAILY WITH OR IMMEDIATELY FOLLOWING A MEAL Patient taking differently: Take 12.5 mg by mouth daily.  Multiple Vitamin (MULTIVITAMIN) tablet Take 1 tablet by mouth daily.    polyethylene glycol (MIRALAX / GLYCOLAX) packet Take 17 g by mouth daily as needed for moderate constipation.   predniSONE (DELTASONE) 2.5 MG tablet TAKE 1 TABLET BY MOUTH EVERY OTHER DAY IN THE MORNING Patient taking differently: Take 2.5 mg by mouth See admin instructions. Every other day, in the morning  psyllium (METAMUCIL) 58.6 % packet Take 1 packet by mouth daily as needed.  PSYLLIUM PO Take 1 tablet by mouth every other day.  trolamine salicylate (ASPERCREME) 10 % cream Apply 1 application topically as needed for muscle pain.  Elastic Bandages & Supports (MEDICAL COMPRESSION STOCKINGS) [place] See admin instructions.  Probiotic Product (PROBIOTIC DAILY PO) Take 1 tablet by mouth daily with breakfast.    Physical Exam: Vitals:   01/05/21 1615 01/05/21 1700 01/05/21 1729 01/05/21 1822  BP: 130/74   123/82   Pulse: (!) 136  80 78  Resp: (!) 25  15 16   Temp:    98.1 F (36.7 C)  TempSrc:    Oral  SpO2: 96%  (!) 89% 91%  Weight:  67.6 kg    Height:  5\' 1"  (1.549 m)     Constitutional: WDWN elderly female in no distress, calm demeanor Eyes: Lids and conjunctivae normal, PERRL ENMT: Mucous membranes are moist. Posterior pharynx clear of any exudate or lesions. Fair dentition.  Neck: normal, supple, no masses, no thyromegaly Respiratory: Non-labored breathing room air without accessory muscle use. Clear breath sounds to auscultation bilaterally Cardiovascular: Irreg irreg with rate in 90-100's. Holosystolic murmur present at base (II/VI) and apex (  II/VI). No rubs, or gallops. No carotid bruits. No JVD, mildly +HJR. Trace LE edema. Palpable pedal pulses. Abdomen: Normoactive bowel sounds. No tenderness, non-distended, and no masses palpated. No hepatosplenomegaly. GU: No indwelling catheter Musculoskeletal: No clubbing / cyanosis. No joint deformity upper and lower extremities. Good ROM, no contractures. Normal muscle tone.  Skin: Warm, dry. No rashes, wounds, or ulcers. No significant lesions noted.  Neurologic: CN II-XII grossly intact. Speech normal. No focal deficits in motor strength or sensation in all extremities.  Psychiatric: Alert and oriented x3. Normal judgment and insight. Mood euthymic with congruent affect.   CXR 1v (personal interpretation): Slight blunting of right CPA, small nodular densities most prominent in R upper/mid zone.   ECG (personal interpretation): Irregular, narrow complex tachycardia without ST elevations.  Assessment/Plan Principal Problem:   Atrial fibrillation with RVR (HCC) Active Problems:   GERD   Hypertension   Current chronic use of systemic steroids   CAD (coronary artery disease)   Aortic stenosis, moderate   Chronic ITP (idiopathic thrombocytopenia) (HCC)   New onset atrial fibrillation with RVR: Precipitant may be recent steroid burst/taper and  injection and/or pneumonia. - Continue diltiazem gtt with goal rate <110bpm. BP sustaining. - Continue home metoprolol 12.5mg  - CHA2DS2-VASc score is 5 (age [2], HTN, CAD, sex). Per cardiology, given the dissimilarity of this episode from prior palpitations, this is regarded as initial onset, so may be candidate for DCCV without TEE. Though anticoagulation would be required for weeks afterward, she may be able to escape attempt at chronic anticoagulation this way. They have started heparin gtt. We will monitor heparin level, blood counts closely - Monitor and replete K, Mg.  - TSH likely to be spurious in setting of biotin and she has no other evidence of thyroid dysfunction.   Possible CAP: Leukocytosis (albeit in the setting of recent steroid burst/taper on chronic prednisone and mild leukocytosis) and CXR opacity with some cough (albeit more chronic). Will treat as this may have been precipitating factor and early in presentation. An alternative explanation for opacity could be scarring from previous bout of pneumonia, however.  - Continue 5 days empiric therapy including atypical coverage.   - Discussed need for follow up CXR 2v in 6-8 weeks to confirm resolution/inform need for further work up.   Chronic ITP:  - Continue stable dose prednisone 2.5mg  QOD (has been at this dose for years).  - Trend values. If we feel long term anticoagulation is definitely indicated, would discuss with hematology.  Moderate AS, mild-moderate MR:  - Echocardiogram  HTN:  - Continue metoprolol. Will hold ARB to make BP room for rate control currently.  CAD: Minimal and relatively flat elevation of troponin is expected with RVR. No angina, not consistent with ACS.  - Continue anticoagulation as above, beta blocker. Statin intolerance noted.  - No further workup recommended at this time.   Depression: Quiescent - Continue SSRI  DVT prophylaxis: Heparin gtt  Code Status: Full  Family Communication: None  at bedside Disposition Plan: Return home Consults called: Cardiology, Dr. Antoine Poche  Admission status: Observation    Tyrone Nine, MD [facility] [email or web address] 01/05/2021, 7:17 PM

## 2021-01-06 ENCOUNTER — Other Ambulatory Visit (HOSPITAL_COMMUNITY): Payer: Self-pay

## 2021-01-06 ENCOUNTER — Other Ambulatory Visit: Payer: Self-pay | Admitting: Home Health

## 2021-01-06 ENCOUNTER — Observation Stay (HOSPITAL_BASED_OUTPATIENT_CLINIC_OR_DEPARTMENT_OTHER): Payer: Medicare HMO

## 2021-01-06 DIAGNOSIS — K219 Gastro-esophageal reflux disease without esophagitis: Secondary | ICD-10-CM | POA: Diagnosis not present

## 2021-01-06 DIAGNOSIS — I340 Nonrheumatic mitral (valve) insufficiency: Secondary | ICD-10-CM | POA: Diagnosis not present

## 2021-01-06 DIAGNOSIS — I350 Nonrheumatic aortic (valve) stenosis: Secondary | ICD-10-CM | POA: Diagnosis not present

## 2021-01-06 DIAGNOSIS — I480 Paroxysmal atrial fibrillation: Secondary | ICD-10-CM

## 2021-01-06 DIAGNOSIS — I4891 Unspecified atrial fibrillation: Secondary | ICD-10-CM | POA: Diagnosis not present

## 2021-01-06 DIAGNOSIS — I4821 Permanent atrial fibrillation: Secondary | ICD-10-CM

## 2021-01-06 DIAGNOSIS — D693 Immune thrombocytopenic purpura: Secondary | ICD-10-CM | POA: Diagnosis not present

## 2021-01-06 DIAGNOSIS — Z7952 Long term (current) use of systemic steroids: Secondary | ICD-10-CM | POA: Diagnosis not present

## 2021-01-06 DIAGNOSIS — I10 Essential (primary) hypertension: Secondary | ICD-10-CM | POA: Diagnosis not present

## 2021-01-06 DIAGNOSIS — I2510 Atherosclerotic heart disease of native coronary artery without angina pectoris: Secondary | ICD-10-CM | POA: Diagnosis not present

## 2021-01-06 LAB — BASIC METABOLIC PANEL
Anion gap: 8 (ref 5–15)
BUN: 23 mg/dL (ref 8–23)
CO2: 23 mmol/L (ref 22–32)
Calcium: 8.2 mg/dL — ABNORMAL LOW (ref 8.9–10.3)
Chloride: 105 mmol/L (ref 98–111)
Creatinine, Ser: 0.89 mg/dL (ref 0.44–1.00)
GFR, Estimated: >60 mL/min (ref >60)
Glucose, Bld: 114 mg/dL — ABNORMAL HIGH (ref 70–99)
Potassium: 4.3 mmol/L (ref 3.5–5.1)
Sodium: 136 mmol/L (ref 135–145)

## 2021-01-06 LAB — ECHOCARDIOGRAM COMPLETE
AR max vel: 1.04 cm2
AV Area VTI: 1.17 cm2
AV Area mean vel: 1.04 cm2
AV Mean grad: 9 mmHg
AV Peak grad: 16.3 mmHg
Ao pk vel: 2.02 m/s
Area-P 1/2: 4.24 cm2
Height: 61 in
P 1/2 time: 462 msec
S' Lateral: 2.1 cm
Single Plane A4C EF: 50.8 %
Weight: 2384.5 oz

## 2021-01-06 LAB — CBC
HCT: 36.8 % (ref 36.0–46.0)
Hemoglobin: 12.3 g/dL (ref 12.0–15.0)
MCH: 28.5 pg (ref 26.0–34.0)
MCHC: 33.4 g/dL (ref 30.0–36.0)
MCV: 85.2 fL (ref 80.0–100.0)
Platelets: UNDETERMINED 10*3/uL (ref 150–400)
RBC: 4.32 MIL/uL (ref 3.87–5.11)
RDW: 14.4 % (ref 11.5–15.5)
WBC: 15.6 10*3/uL — ABNORMAL HIGH (ref 4.0–10.5)
nRBC: 0 % (ref 0.0–0.2)

## 2021-01-06 LAB — MAGNESIUM: Magnesium: 2.1 mg/dL (ref 1.7–2.4)

## 2021-01-06 LAB — HEPARIN LEVEL (UNFRACTIONATED)
Heparin Unfractionated: 0.78 IU/mL — ABNORMAL HIGH (ref 0.30–0.70)
Heparin Unfractionated: 0.49 IU/mL (ref 0.30–0.70)

## 2021-01-06 LAB — TSH: TSH: 1.984 u[IU]/mL (ref 0.350–4.500)

## 2021-01-06 MED ORDER — APIXABAN 5 MG PO TABS
5.0000 mg | ORAL_TABLET | Freq: Two times a day (BID) | ORAL | Status: DC
  Administered 2021-01-06: 5 mg via ORAL
  Filled 2021-01-06: qty 1

## 2021-01-06 MED ORDER — METOPROLOL SUCCINATE ER 25 MG PO TB24: 25.0000 mg | ORAL_TABLET | Freq: Every day | ORAL | 1 refills | Status: DC

## 2021-01-06 MED ORDER — LOSARTAN POTASSIUM 25 MG PO TABS: 25.0000 mg | ORAL_TABLET | Freq: Every day | ORAL | Status: DC

## 2021-01-06 MED ORDER — APIXABAN 5 MG PO TABS: 5.0000 mg | ORAL_TABLET | Freq: Two times a day (BID) | ORAL | 0 refills | Status: DC

## 2021-01-06 NOTE — Progress Notes (Signed)
  Echocardiogram 2D Echocardiogram has been performed.  Tamara Robertson 01/06/2021, 9:35 AM

## 2021-01-06 NOTE — Progress Notes (Signed)
ANTICOAGULATION CONSULT NOTE  Pharmacy Consult for heparin Indication: atrial fibrillation  Allergies  Allergen Reactions   Aspirin Other (See Comments)    ITP   Sulfa Antibiotics Other (See Comments)    dizziness   Contrast Media [Iodinated Diagnostic Agents] Hives    CAT scan contrast only   Fluoxetine     Did not feel well on it    Celebrex [Celecoxib] Other (See Comments)    Leg swelling   Celexa [Citalopram] Other (See Comments)    Felt out of it   Statins Other (See Comments)    Patient Measurements: Height: '5\' 1"'$  (154.9 cm) Weight: 67.6 kg (149 lb 0.5 oz) IBW/kg (Calculated) : 47.8 Heparin Dosing Weight: 62.1 kg  Vital Signs: Temp: 98.2 F (36.8 C) (09/02 0330) Temp Source: Oral (09/02 0330) BP: 144/55 (09/02 0330) Pulse Rate: 64 (09/02 0330)  Labs: Recent Labs    01/05/21 1455 01/05/21 1657 01/05/21 2129 01/06/21 0331  HGB 13.4  --   --  12.3  HCT 40.4  --   --  36.8  PLT 128*  --   --  PENDING  APTT  --   --  23*  --   LABPROT 13.5  --   --   --   INR 1.0  --   --   --   HEPARINUNFRC  --   --   --  0.78*  CREATININE 0.81  --   --  0.89  TROPONINIHS 31* 38*  --   --      Estimated Creatinine Clearance: 37.7 mL/min (by C-G formula based on SCr of 0.89 mg/dL).   Assessment: 87 yo female presenting for evaluation of new atrial fibrillation. Pharmacy consulted for heparin dosing. No AC prior to admission - of note, patient has history of ITP.   Heparin level slightly supratherapeutic (0.78) on gtt at 900 units/hr - level drawn ~4 hr post gtt start so may be partially bolus effect. No bleeding noted.  Goal of Therapy:  Heparin level 0.3-0.7 units/ml Monitor platelets by anticoagulation protocol: Yes   Plan:  Decrease heparin infusion to 800 units/hr 8 hour heparin level  Sherlon Handing, PharmD, BCPS Please see amion for complete clinical pharmacist phone list 01/06/2021  4:21 AM

## 2021-01-06 NOTE — Discharge Instructions (Signed)

## 2021-01-06 NOTE — Plan of Care (Signed)
Follow up with cardiology  has been arranged on 02/09/21 at 930 AM. 30 days event monitor ordered, staff will contact patient for placing it.

## 2021-01-06 NOTE — Progress Notes (Addendum)
Progress Note  Patient Name: Tamara Robertson Date of Encounter: 01/06/2021  [facility] Cardiologist: [name], MD   Subjective   Patient states she feels improved, no longer having heart palpitations, denied any chest pain or pressure, dizziness, fever, chills. She was able to tolerate ambulation to bathroom yesterday without problems. She is wondering if she can get discharged today.    Inpatient Medications    Scheduled Meds:  acidophilus  1 capsule Oral Q breakfast   azithromycin  500 mg Oral Daily   escitalopram  5 mg Oral Daily   metoprolol succinate  12.5 mg Oral Daily   predniSONE  2.5 mg Oral QODAY   sodium chloride flush  3 mL Intravenous Q12H   Continuous Infusions:  cefTRIAXone (ROCEPHIN)  IV     diltiazem (CARDIZEM) infusion Stopped (01/05/21 2345)   heparin 800 Units/hr (01/06/21 0450)   PRN Meds: acetaminophen **OR** acetaminophen, calcium carbonate, famotidine, ondansetron **OR** ondansetron (ZOFRAN) IV, polyethylene glycol   Vital Signs    Vitals:   01/05/21 2032 01/05/21 2259 01/05/21 2351 01/06/21 0330  BP: (!) 154/75 127/61 129/61 (!) 144/55  Pulse: 88 79 64 64  Resp: '17 17 16 18  '$ Temp: 98 F (36.7 C) 98.1 F (36.7 C) 97.8 F (36.6 C) 98.2 F (36.8 C)  TempSrc: Oral Oral Oral Oral  SpO2: 93% 96% 94% 94%  Weight:      Height:        Intake/Output Summary (Last 24 hours) at 01/06/2021 0854 Last data filed at 01/06/2021 0400 Gross per 24 hour  Intake 131.48 ml  Output --  Net 131.48 ml   Last 3 Weights 01/05/2021 12/14/2020 10/07/2020  Weight (lbs) 149 lb 0.5 oz 149 lb 150 lb 11.2 oz  Weight (kg) 67.6 kg 67.586 kg 68.357 kg      Telemetry    Conversion to sinus rhythm with first degree AVB around 2300 on 01/05/21, currently sinus bradycardia with rate of high 50s and low 60s, noted intermittent sinus pauses lasting up to 2.5 seconds - Personally Reviewed  ECG    EKG pending from this AM - Personally Reviewed  Physical Exam    GEN: No acute distress. Laying in bed.  Neck: No JVD Cardiac: RRR, systolic murmur grade II/IV on RSB Respiratory: Clear to auscultation bilaterally. On room air.  GI: Soft, nontender, non-distended  MS: No BLE edema; No deformity. Right shoulder subjective pain.  Neuro:  Nonfocal  Psych: Normal affect   Labs    High Sensitivity Troponin:   Recent Labs  Lab 01/05/21 1455 01/05/21 1657  TROPONINIHS 31* 38*      Chemistry Recent Labs  Lab 01/05/21 1455 01/06/21 0331  NA 141 136  K 4.1 4.3  CL 109 105  CO2 24 23  GLUCOSE 147* 114*  BUN 20 23  CREATININE 0.81 0.89  CALCIUM 8.6* 8.2*  GFRNONAA >60 >60  ANIONGAP 8 8     Hematology Recent Labs  Lab 01/05/21 1455 01/06/21 0331  WBC 18.7* 15.6*  RBC 4.64 4.32  HGB 13.4 12.3  HCT 40.4 36.8  MCV 87.1 85.2  MCH 28.9 28.5  MCHC 33.2 33.4  RDW 14.4 14.4  PLT 128* PLATELET CLUMPS NOTED ON SMEAR, UNABLE TO ESTIMATE    BNPNo results for input(s): BNP, PROBNP in the last 168 hours.   DDimer No results for input(s): DDIMER in the last 168 hours.   Radiology    DG Chest Portable 1 View  Result Date: 01/05/2021 CLINICAL  DATA:  Shortness of breath and palpitations in a 87 year old female. EXAM: PORTABLE CHEST 1 VIEW COMPARISON:  Sep 20, 2017. FINDINGS: Trachea midline. Cardiomediastinal contours and hilar structures are stable. Lungs without lobar consolidation. No sign of pleural effusion. No visible pneumothorax. EKG leads project over the chest. On limited assessment there is no acute skeletal process. Subtle nodular opacities project over the upper lobes, for instance in the RIGHT upper lobe between the fifth and sixth ribs and in the LEFT upper lobe posterior to the sixth rib. Some of these areas could represent confluence of shadows, this is not clear. IMPRESSION: Subtle nodular densities in the chest. Could represent developing infection, small pulmonary nodules not excluded, suggest PA and lateral chest radiograph in  6-8 weeks, particularly if there are symptoms of infection and there is an indication for a trial of antibiotic therapy. Electronically Signed   By: Zetta Bills M.D.   On: 01/05/2021 15:45    Cardiac Studies   Echo 01/06/2021  1. Left ventricular ejection fraction, by estimation, is 60 to 65%. The  left ventricle has normal function. The left ventricle has no regional  wall motion abnormalities. Left ventricular diastolic parameters are  consistent with Grade II diastolic  dysfunction (pseudonormalization). There is basal septal hypertrophy  noted.   2. Right ventricular systolic function is normal. The right ventricular  size is normal. There is normal pulmonary artery systolic pressure.   3. The mitral valve is grossly normal. Mild to moderate mitral valve  regurgitation. No evidence of mitral stenosis.   4. The aortic valve was not well visualized. Aortic valve regurgitation  is mild to moderate. No aortic stenosis is present. Aortic regurgitation  PHT measures 462 msec. No diastolic flow reversal.   5. Mild triscuspid regurgitation with two jets.   Echo from 06/29/2017:  - Left ventricle: The cavity size was normal. Wall thickness was    increased in a pattern of moderate LVH. Systolic function was    normal. The estimated ejection fraction was in the range of 60%    to 65%. Wall motion was normal; there were no regional wall    motion abnormalities. Diastolic dysfunction, grade indeterminate.    Elevated filling pressures.  - Aortic valve: Moderately calcified leaflets. Leaflets were    suboptimally visualized. There was moderate stenosis. There was    trivial regurgitation. Peak velocity (S): 292 cm/s. Mean gradient    (S): 20 mm Hg. Valve area (VTI): 1.19 cm^2. Valve area (Vmax):    1.07 cm^2. Valve area (Vmean): 1.12 cm^2.  - Mitral valve: There was mild to moderate regurgitation.  - Tricuspid valve: There was mild regurgitation.  - Pulmonary arteries: PA peak pressure: 37  mm Hg (S).    Patient Profile     87 y.o. female with PMH of non-obstructive CAD (40% RCA stenosis on LHC in 2012), moderate aortic stenosis,  HTN, HLD, GERD, chronic ITP on prednisone, depression/anxiety, who presented with heart palpitation,  is currently admitted for new onset of A fib with RVR, presumed CAP.   Assessment & Plan    New onset of A fib with RVR -Presented with heart palpitation -EKG revealed A. fib with RVR, POA, new onset  - will check TSH, Mag today; optimize electrolytes to keep K >4 and Mag >2 - Suspect due to underlying pneumonia - Echocardiogram is pending today - Converted to sinus rhythm on cardizem gtt and PO metoprolol XL 12.'5mg'$  daily overnight, currently in sinus bradycardia high 50s with first  degree AVB and intermittent sinus pause <2.5 sec, will stop diltiazem drip today, may increase metoprolol XL '25mg'$  daily if needed for rate control -CHA2DS2-VASc score is 65 (age, gender, hypertension, CAD), anticoagulation started with heparin infusion if tolerating, trend CBC, may transition to Eliquis  Elevated troponin  Nonobstructive CAD -High sensitive troponin 31 >38 -Initial EKG with A. fib RVR, EKG with conversion to NSR, no ischemic change -Heart cath from 2012 with 40% RCA stenosis, nonobstructive CAD -Chest discomfort likely due to A. fib RVR, Trop elevation is demand ischemia, no further ischemic work-up is warranted at this time  Presumed CAP  -Atypical presentation, started on empiric antibiotic per IM, leukocytosis is improving  Moderate aortic stenosis -Repeat echocardiogram is pending, no signs of decompensated heart failure at this time  Hypertension -Blood pressure overall fairly controlled on metoprolol, home meds losartan is currently held  Chronic ITP GERD Anxiety with depression -Managed per IM       For questions or updates, please contact [facility] Please consult [email or web address] for contact info under        Signed, Margie Billet, NP  01/06/2021, 8:54 AM    History and all data above reviewed.  Patient examined.  I agree with the findings as above. Feels better but some mild cough. The patient exam reveals COR:RRR  ,  Lungs: Few diffuse crackles  ,  Abd: Positive bowel sounds, no rebound no guarding, Ext No edema  .  All available labs, radiology testing, previous records reviewed. Agree with documented assessment and plan.   Atrial fib:  Paroxysmal.  I will continue Eliquis for now and I will followup as an out patient to see if she is having further atrial fib.  This seemed to be an isolated event.  She is at risk for long term anticoagulation with falls and ortho issues.  She lives alone.  I will order a four week event monitor for her. I will see her after that.      Jeneen Rinks Fallon Haecker  1:52 PM  01/06/2021

## 2021-01-06 NOTE — TOC Benefit Eligibility Note (Signed)
Patient Teacher, English as a foreign language completed.    The patient is currently admitted and upon discharge could be taking Eliquis 5 mg.  The current 30 day co-pay is, $47.00.   The patient is insured through [place], [place] Patient Advocate Specialist [place] Team Direct Number: [phone]  Fax: [phone]

## 2021-01-06 NOTE — Progress Notes (Signed)
30 days event monitor ordered per MD request. Emailed coordinator to place the patient on monitor.

## 2021-01-07 NOTE — Discharge Summary (Addendum)
61.0 in Accession #:    [id number]          Weight:       149.0 lb Date of Birth:  [birth year]            BSA:          1.667 m Patient Age:    87 years            BP:           144/55 mmHg Patient Gender: F                   HR:           62 bpm. Exam Location:  Inpatient Procedure: 2D Echo Indications:    Atrial fibrillation. Mitral valve disorder.  History:        Patient has prior history of Echocardiogram examinations, most                 recent 06/29/2017. CAD.  Sonographer:    Johny Chess RDCS Referring Phys: Eldridge  1. Left ventricular ejection fraction, by estimation, is 60 to 65%. The left ventricle has normal function. The left ventricle has no regional wall motion abnormalities. Left ventricular diastolic parameters are consistent with Grade II diastolic dysfunction (pseudonormalization). There is basal septal hypertrophy noted.  2. Right ventricular systolic function is normal. The right ventricular size is normal. There is normal pulmonary artery systolic pressure.  3. The mitral valve is grossly normal. Mild to moderate mitral valve regurgitation. No evidence of mitral stenosis.  4. The aortic valve was not well visualized. Aortic valve regurgitation is mild to moderate. No aortic stenosis is present. Aortic regurgitation PHT measures 462 msec. No diastolic flow reversal.  5. Mild triscuspid regurgitation with two jets. Comparison(s): A prior study was performed on 06/19/2017. Aortic regurgitation noted; Aortic valve gradients decrease  from those seen in prior study. FINDINGS  Left Ventricle: Left ventricular ejection fraction, by estimation, is 60 to 65%. The left ventricle has normal function. The left ventricle has no regional wall motion abnormalities. The left ventricular internal cavity size was small. There is no left ventricular hypertrophy. Left ventricular diastolic parameters are consistent with Grade II diastolic dysfunction (pseudonormalization). Right Ventricle: The right ventricular size is normal. No increase in right ventricular wall thickness. Right ventricular systolic function is normal. There is normal pulmonary artery systolic pressure. The tricuspid regurgitant velocity is 2.63 m/s, and  with an assumed right atrial pressure of 3 mmHg, the estimated right ventricular systolic pressure is 99991111 mmHg. Left Atrium: Left atrial size was normal in size. Right Atrium: Right atrial size was normal in size. Pericardium: There is no evidence of pericardial effusion. Mitral Valve: The mitral valve is grossly normal. Mild to moderate mitral valve regurgitation. No evidence of mitral valve stenosis. Tricuspid Valve: The tricuspid valve is normal in structure. Tricuspid valve regurgitation is mild . No evidence of tricuspid stenosis. Aortic Valve: The aortic valve was not well visualized. Aortic valve regurgitation is mild to moderate. Aortic regurgitation PHT measures 462 msec. No aortic stenosis is present. Aortic valve mean gradient measures 9.0 mmHg. Aortic valve peak gradient measures 16.3 mmHg. Aortic valve area, by VTI measures 1.17 cm. Pulmonic Valve: The pulmonic valve was not well visualized. Pulmonic valve regurgitation is not visualized. No evidence of pulmonic stenosis. Aorta: The aortic root and ascending aorta are structurally normal, with no evidence of dilitation. IAS/Shunts: The atrial septum is grossly normal.  61.0 in Accession #:    [id number]          Weight:       149.0 lb Date of Birth:  [birth year]            BSA:          1.667 m Patient Age:    87 years            BP:           144/55 mmHg Patient Gender: F                   HR:           62 bpm. Exam Location:  Inpatient Procedure: 2D Echo Indications:    Atrial fibrillation. Mitral valve disorder.  History:        Patient has prior history of Echocardiogram examinations, most                 recent 06/29/2017. CAD.  Sonographer:    Johny Chess RDCS Referring Phys: Eldridge  1. Left ventricular ejection fraction, by estimation, is 60 to 65%. The left ventricle has normal function. The left ventricle has no regional wall motion abnormalities. Left ventricular diastolic parameters are consistent with Grade II diastolic dysfunction (pseudonormalization). There is basal septal hypertrophy noted.  2. Right ventricular systolic function is normal. The right ventricular size is normal. There is normal pulmonary artery systolic pressure.  3. The mitral valve is grossly normal. Mild to moderate mitral valve regurgitation. No evidence of mitral stenosis.  4. The aortic valve was not well visualized. Aortic valve regurgitation is mild to moderate. No aortic stenosis is present. Aortic regurgitation PHT measures 462 msec. No diastolic flow reversal.  5. Mild triscuspid regurgitation with two jets. Comparison(s): A prior study was performed on 06/19/2017. Aortic regurgitation noted; Aortic valve gradients decrease  from those seen in prior study. FINDINGS  Left Ventricle: Left ventricular ejection fraction, by estimation, is 60 to 65%. The left ventricle has normal function. The left ventricle has no regional wall motion abnormalities. The left ventricular internal cavity size was small. There is no left ventricular hypertrophy. Left ventricular diastolic parameters are consistent with Grade II diastolic dysfunction (pseudonormalization). Right Ventricle: The right ventricular size is normal. No increase in right ventricular wall thickness. Right ventricular systolic function is normal. There is normal pulmonary artery systolic pressure. The tricuspid regurgitant velocity is 2.63 m/s, and  with an assumed right atrial pressure of 3 mmHg, the estimated right ventricular systolic pressure is 99991111 mmHg. Left Atrium: Left atrial size was normal in size. Right Atrium: Right atrial size was normal in size. Pericardium: There is no evidence of pericardial effusion. Mitral Valve: The mitral valve is grossly normal. Mild to moderate mitral valve regurgitation. No evidence of mitral valve stenosis. Tricuspid Valve: The tricuspid valve is normal in structure. Tricuspid valve regurgitation is mild . No evidence of tricuspid stenosis. Aortic Valve: The aortic valve was not well visualized. Aortic valve regurgitation is mild to moderate. Aortic regurgitation PHT measures 462 msec. No aortic stenosis is present. Aortic valve mean gradient measures 9.0 mmHg. Aortic valve peak gradient measures 16.3 mmHg. Aortic valve area, by VTI measures 1.17 cm. Pulmonic Valve: The pulmonic valve was not well visualized. Pulmonic valve regurgitation is not visualized. No evidence of pulmonic stenosis. Aorta: The aortic root and ascending aorta are structurally normal, with no evidence of dilitation. IAS/Shunts: The atrial septum is grossly normal.  61.0 in Accession #:    [id number]          Weight:       149.0 lb Date of Birth:  [birth year]            BSA:          1.667 m Patient Age:    87 years            BP:           144/55 mmHg Patient Gender: F                   HR:           62 bpm. Exam Location:  Inpatient Procedure: 2D Echo Indications:    Atrial fibrillation. Mitral valve disorder.  History:        Patient has prior history of Echocardiogram examinations, most                 recent 06/29/2017. CAD.  Sonographer:    Johny Chess RDCS Referring Phys: Eldridge  1. Left ventricular ejection fraction, by estimation, is 60 to 65%. The left ventricle has normal function. The left ventricle has no regional wall motion abnormalities. Left ventricular diastolic parameters are consistent with Grade II diastolic dysfunction (pseudonormalization). There is basal septal hypertrophy noted.  2. Right ventricular systolic function is normal. The right ventricular size is normal. There is normal pulmonary artery systolic pressure.  3. The mitral valve is grossly normal. Mild to moderate mitral valve regurgitation. No evidence of mitral stenosis.  4. The aortic valve was not well visualized. Aortic valve regurgitation is mild to moderate. No aortic stenosis is present. Aortic regurgitation PHT measures 462 msec. No diastolic flow reversal.  5. Mild triscuspid regurgitation with two jets. Comparison(s): A prior study was performed on 06/19/2017. Aortic regurgitation noted; Aortic valve gradients decrease  from those seen in prior study. FINDINGS  Left Ventricle: Left ventricular ejection fraction, by estimation, is 60 to 65%. The left ventricle has normal function. The left ventricle has no regional wall motion abnormalities. The left ventricular internal cavity size was small. There is no left ventricular hypertrophy. Left ventricular diastolic parameters are consistent with Grade II diastolic dysfunction (pseudonormalization). Right Ventricle: The right ventricular size is normal. No increase in right ventricular wall thickness. Right ventricular systolic function is normal. There is normal pulmonary artery systolic pressure. The tricuspid regurgitant velocity is 2.63 m/s, and  with an assumed right atrial pressure of 3 mmHg, the estimated right ventricular systolic pressure is 99991111 mmHg. Left Atrium: Left atrial size was normal in size. Right Atrium: Right atrial size was normal in size. Pericardium: There is no evidence of pericardial effusion. Mitral Valve: The mitral valve is grossly normal. Mild to moderate mitral valve regurgitation. No evidence of mitral valve stenosis. Tricuspid Valve: The tricuspid valve is normal in structure. Tricuspid valve regurgitation is mild . No evidence of tricuspid stenosis. Aortic Valve: The aortic valve was not well visualized. Aortic valve regurgitation is mild to moderate. Aortic regurgitation PHT measures 462 msec. No aortic stenosis is present. Aortic valve mean gradient measures 9.0 mmHg. Aortic valve peak gradient measures 16.3 mmHg. Aortic valve area, by VTI measures 1.17 cm. Pulmonic Valve: The pulmonic valve was not well visualized. Pulmonic valve regurgitation is not visualized. No evidence of pulmonic stenosis. Aorta: The aortic root and ascending aorta are structurally normal, with no evidence of dilitation. IAS/Shunts: The atrial septum is grossly normal.  Physician Discharge Summary  Arijah Foyle [id number] DOB: 1929-10-17 DOA: 01/05/2021  PCP: Binnie Rail, MD  Admit date: 01/05/2021 Discharge date: 01/06/2021  Admitted From: Home Disposition: Home   Recommendations for Outpatient Follow-up:  Follow up with PCP in 1-2 weeks.  Recheck vital signs. Increased metoprolol and held losartan at discharge pending follow up. Recheck CBC. Eliquis started for stroke risk reduction in pt with stable ITP after conferring with her hematologist. Recommend repeat CXR in 6-8 weeks following antibiotic therapy and resolution of acute episode. Follow up with cardiology  has been arranged on 02/09/21 at 930 AM. 30 days event monitor ordered, staff will contact patient for placing it.   Home Health: None Equipment/Devices: None Discharge Condition: Stable CODE STATUS: Full Diet recommendation: Heart healthy  Brief/Interim Summary: Tamara Robertson is an active 87 y.o. female with a history of chronic ITP on prednisone, CAD, HTN, depression who presented to the ED after activating EMS due to palpitations when she was found to have HR in 160's, down to 100's with diltiazem '20mg'$  IV by arrival in ED where ECG confirmed AFib with RVR. She describes some intermittent palpitations fleetingly but never anything like this before. There was no chest pain, dyspnea, abd pain N/V/D. She did have steroid injection in her back and recently took a prednisone taper 3 weeks - 2 weeks ago. She has a mild nonproductive cough that is not new, no sputum production, wheezing, fever, chills, sick contacts. She did have pneumonia rather severely years ago.     ED Course: Started on diltiazem gtt with improvement in HR. Troponins 31 > 38 without chest pain. WBC 18.7k (from mildly elevated baseline), platelets 128k, hgb normal at 13.7. Covid test negative. CXR demonstrates subtle nodularity. Ceftriaxone and azithromycin were started and hospitalists consulted for admission.    Hospital Course: Rhythm converted to NSR, diltiazem infusion stopped. Patient felt significantly improved symptomatically and was cleared for discharge by cardiology with plans for outpatient event monitoring.   Discharge Diagnoses:  Principal Problem:   Atrial fibrillation with RVR (HCC) Active Problems:   GERD   Hypertension   Current chronic use of systemic steroids   CAD (coronary artery disease)   Aortic stenosis, moderate   Chronic ITP (idiopathic thrombocytopenia) (HCC)  New onset atrial fibrillation with RVR: Precipitant may be recent steroid burst/taper and injection and/or pneumonia. - Stopped diltiazem due to conversion overnight to sinus bradycardia, 1st deg AVB  - Continue home metoprolol > increase 12.'5mg'$  > '25mg'$  - CHA2DS2-VASc score is 5 (age [2], HTN, CAD, sex). Started eliquis at discharge.   - Monitor and replete K, Mg.  - TSH wnl.     Possible CAP: Leukocytosis (albeit in the setting of recent steroid burst/taper on chronic prednisone and mild leukocytosis) and CXR opacity with some cough (albeit more chronic). Will treat as this may have been precipitating factor and early in presentation.   - Continue 5 days empiric therapy including atypical coverage.   - Discussed need for follow up CXR 2v in 6-8 weeks to confirm resolution/inform need for further work up.    Chronic ITP: Diagnosed many decades ago, last flare was 2000, always has responded to high dose steroids. Quiescent since that time. - Continue stable dose prednisone 2.'5mg'$  QOD (has been at this dose for years).  - Discussed with Dr. Alen Blew who is ok with starting DOAC since indicated at this time.    Moderate AS, mild-moderate MR:  - Echocardiogram showed decreased gradients from prior study  Physician Discharge Summary  Arijah Foyle [id number] DOB: 1929-10-17 DOA: 01/05/2021  PCP: Binnie Rail, MD  Admit date: 01/05/2021 Discharge date: 01/06/2021  Admitted From: Home Disposition: Home   Recommendations for Outpatient Follow-up:  Follow up with PCP in 1-2 weeks.  Recheck vital signs. Increased metoprolol and held losartan at discharge pending follow up. Recheck CBC. Eliquis started for stroke risk reduction in pt with stable ITP after conferring with her hematologist. Recommend repeat CXR in 6-8 weeks following antibiotic therapy and resolution of acute episode. Follow up with cardiology  has been arranged on 02/09/21 at 930 AM. 30 days event monitor ordered, staff will contact patient for placing it.   Home Health: None Equipment/Devices: None Discharge Condition: Stable CODE STATUS: Full Diet recommendation: Heart healthy  Brief/Interim Summary: Tamara Robertson is an active 87 y.o. female with a history of chronic ITP on prednisone, CAD, HTN, depression who presented to the ED after activating EMS due to palpitations when she was found to have HR in 160's, down to 100's with diltiazem '20mg'$  IV by arrival in ED where ECG confirmed AFib with RVR. She describes some intermittent palpitations fleetingly but never anything like this before. There was no chest pain, dyspnea, abd pain N/V/D. She did have steroid injection in her back and recently took a prednisone taper 3 weeks - 2 weeks ago. She has a mild nonproductive cough that is not new, no sputum production, wheezing, fever, chills, sick contacts. She did have pneumonia rather severely years ago.     ED Course: Started on diltiazem gtt with improvement in HR. Troponins 31 > 38 without chest pain. WBC 18.7k (from mildly elevated baseline), platelets 128k, hgb normal at 13.7. Covid test negative. CXR demonstrates subtle nodularity. Ceftriaxone and azithromycin were started and hospitalists consulted for admission.    Hospital Course: Rhythm converted to NSR, diltiazem infusion stopped. Patient felt significantly improved symptomatically and was cleared for discharge by cardiology with plans for outpatient event monitoring.   Discharge Diagnoses:  Principal Problem:   Atrial fibrillation with RVR (HCC) Active Problems:   GERD   Hypertension   Current chronic use of systemic steroids   CAD (coronary artery disease)   Aortic stenosis, moderate   Chronic ITP (idiopathic thrombocytopenia) (HCC)  New onset atrial fibrillation with RVR: Precipitant may be recent steroid burst/taper and injection and/or pneumonia. - Stopped diltiazem due to conversion overnight to sinus bradycardia, 1st deg AVB  - Continue home metoprolol > increase 12.'5mg'$  > '25mg'$  - CHA2DS2-VASc score is 5 (age [2], HTN, CAD, sex). Started eliquis at discharge.   - Monitor and replete K, Mg.  - TSH wnl.     Possible CAP: Leukocytosis (albeit in the setting of recent steroid burst/taper on chronic prednisone and mild leukocytosis) and CXR opacity with some cough (albeit more chronic). Will treat as this may have been precipitating factor and early in presentation.   - Continue 5 days empiric therapy including atypical coverage.   - Discussed need for follow up CXR 2v in 6-8 weeks to confirm resolution/inform need for further work up.    Chronic ITP: Diagnosed many decades ago, last flare was 2000, always has responded to high dose steroids. Quiescent since that time. - Continue stable dose prednisone 2.'5mg'$  QOD (has been at this dose for years).  - Discussed with Dr. Alen Blew who is ok with starting DOAC since indicated at this time.    Moderate AS, mild-moderate MR:  - Echocardiogram showed decreased gradients from prior study  Physician Discharge Summary  Arijah Foyle [id number] DOB: 1929-10-17 DOA: 01/05/2021  PCP: Binnie Rail, MD  Admit date: 01/05/2021 Discharge date: 01/06/2021  Admitted From: Home Disposition: Home   Recommendations for Outpatient Follow-up:  Follow up with PCP in 1-2 weeks.  Recheck vital signs. Increased metoprolol and held losartan at discharge pending follow up. Recheck CBC. Eliquis started for stroke risk reduction in pt with stable ITP after conferring with her hematologist. Recommend repeat CXR in 6-8 weeks following antibiotic therapy and resolution of acute episode. Follow up with cardiology  has been arranged on 02/09/21 at 930 AM. 30 days event monitor ordered, staff will contact patient for placing it.   Home Health: None Equipment/Devices: None Discharge Condition: Stable CODE STATUS: Full Diet recommendation: Heart healthy  Brief/Interim Summary: Tamara Robertson is an active 87 y.o. female with a history of chronic ITP on prednisone, CAD, HTN, depression who presented to the ED after activating EMS due to palpitations when she was found to have HR in 160's, down to 100's with diltiazem '20mg'$  IV by arrival in ED where ECG confirmed AFib with RVR. She describes some intermittent palpitations fleetingly but never anything like this before. There was no chest pain, dyspnea, abd pain N/V/D. She did have steroid injection in her back and recently took a prednisone taper 3 weeks - 2 weeks ago. She has a mild nonproductive cough that is not new, no sputum production, wheezing, fever, chills, sick contacts. She did have pneumonia rather severely years ago.     ED Course: Started on diltiazem gtt with improvement in HR. Troponins 31 > 38 without chest pain. WBC 18.7k (from mildly elevated baseline), platelets 128k, hgb normal at 13.7. Covid test negative. CXR demonstrates subtle nodularity. Ceftriaxone and azithromycin were started and hospitalists consulted for admission.    Hospital Course: Rhythm converted to NSR, diltiazem infusion stopped. Patient felt significantly improved symptomatically and was cleared for discharge by cardiology with plans for outpatient event monitoring.   Discharge Diagnoses:  Principal Problem:   Atrial fibrillation with RVR (HCC) Active Problems:   GERD   Hypertension   Current chronic use of systemic steroids   CAD (coronary artery disease)   Aortic stenosis, moderate   Chronic ITP (idiopathic thrombocytopenia) (HCC)  New onset atrial fibrillation with RVR: Precipitant may be recent steroid burst/taper and injection and/or pneumonia. - Stopped diltiazem due to conversion overnight to sinus bradycardia, 1st deg AVB  - Continue home metoprolol > increase 12.'5mg'$  > '25mg'$  - CHA2DS2-VASc score is 5 (age [2], HTN, CAD, sex). Started eliquis at discharge.   - Monitor and replete K, Mg.  - TSH wnl.     Possible CAP: Leukocytosis (albeit in the setting of recent steroid burst/taper on chronic prednisone and mild leukocytosis) and CXR opacity with some cough (albeit more chronic). Will treat as this may have been precipitating factor and early in presentation.   - Continue 5 days empiric therapy including atypical coverage.   - Discussed need for follow up CXR 2v in 6-8 weeks to confirm resolution/inform need for further work up.    Chronic ITP: Diagnosed many decades ago, last flare was 2000, always has responded to high dose steroids. Quiescent since that time. - Continue stable dose prednisone 2.'5mg'$  QOD (has been at this dose for years).  - Discussed with Dr. Alen Blew who is ok with starting DOAC since indicated at this time.    Moderate AS, mild-moderate MR:  - Echocardiogram showed decreased gradients from prior study  61.0 in Accession #:    [id number]          Weight:       149.0 lb Date of Birth:  [birth year]            BSA:          1.667 m Patient Age:    87 years            BP:           144/55 mmHg Patient Gender: F                   HR:           62 bpm. Exam Location:  Inpatient Procedure: 2D Echo Indications:    Atrial fibrillation. Mitral valve disorder.  History:        Patient has prior history of Echocardiogram examinations, most                 recent 06/29/2017. CAD.  Sonographer:    Johny Chess RDCS Referring Phys: Eldridge  1. Left ventricular ejection fraction, by estimation, is 60 to 65%. The left ventricle has normal function. The left ventricle has no regional wall motion abnormalities. Left ventricular diastolic parameters are consistent with Grade II diastolic dysfunction (pseudonormalization). There is basal septal hypertrophy noted.  2. Right ventricular systolic function is normal. The right ventricular size is normal. There is normal pulmonary artery systolic pressure.  3. The mitral valve is grossly normal. Mild to moderate mitral valve regurgitation. No evidence of mitral stenosis.  4. The aortic valve was not well visualized. Aortic valve regurgitation is mild to moderate. No aortic stenosis is present. Aortic regurgitation PHT measures 462 msec. No diastolic flow reversal.  5. Mild triscuspid regurgitation with two jets. Comparison(s): A prior study was performed on 06/19/2017. Aortic regurgitation noted; Aortic valve gradients decrease  from those seen in prior study. FINDINGS  Left Ventricle: Left ventricular ejection fraction, by estimation, is 60 to 65%. The left ventricle has normal function. The left ventricle has no regional wall motion abnormalities. The left ventricular internal cavity size was small. There is no left ventricular hypertrophy. Left ventricular diastolic parameters are consistent with Grade II diastolic dysfunction (pseudonormalization). Right Ventricle: The right ventricular size is normal. No increase in right ventricular wall thickness. Right ventricular systolic function is normal. There is normal pulmonary artery systolic pressure. The tricuspid regurgitant velocity is 2.63 m/s, and  with an assumed right atrial pressure of 3 mmHg, the estimated right ventricular systolic pressure is 99991111 mmHg. Left Atrium: Left atrial size was normal in size. Right Atrium: Right atrial size was normal in size. Pericardium: There is no evidence of pericardial effusion. Mitral Valve: The mitral valve is grossly normal. Mild to moderate mitral valve regurgitation. No evidence of mitral valve stenosis. Tricuspid Valve: The tricuspid valve is normal in structure. Tricuspid valve regurgitation is mild . No evidence of tricuspid stenosis. Aortic Valve: The aortic valve was not well visualized. Aortic valve regurgitation is mild to moderate. Aortic regurgitation PHT measures 462 msec. No aortic stenosis is present. Aortic valve mean gradient measures 9.0 mmHg. Aortic valve peak gradient measures 16.3 mmHg. Aortic valve area, by VTI measures 1.17 cm. Pulmonic Valve: The pulmonic valve was not well visualized. Pulmonic valve regurgitation is not visualized. No evidence of pulmonic stenosis. Aorta: The aortic root and ascending aorta are structurally normal, with no evidence of dilitation. IAS/Shunts: The atrial septum is grossly normal.

## 2021-01-10 ENCOUNTER — Telehealth: Payer: Self-pay | Admitting: Cardiology

## 2021-01-10 DIAGNOSIS — M25511 Pain in right shoulder: Secondary | ICD-10-CM | POA: Diagnosis not present

## 2021-01-10 DIAGNOSIS — M542 Cervicalgia: Secondary | ICD-10-CM | POA: Diagnosis not present

## 2021-01-10 DIAGNOSIS — M545 Low back pain, unspecified: Secondary | ICD-10-CM | POA: Diagnosis not present

## 2021-01-10 SURGERY — ECHOCARDIOGRAM, TRANSESOPHAGEAL
Anesthesia: Monitor Anesthesia Care

## 2021-01-10 NOTE — Telephone Encounter (Signed)
Pt advised of pharm D recommendations. Pt verbalized understanding. Pt state she had an episode this morning where she experienced increased palpations but HR was 89. Nurse informed pt that she could possibly be flipping in and out of afib but the Elqiuis is what provides protection against stroke prevention. Pt also report she has been more lightheaded than normal and just haven't felt like herself.   Appointment scheduled for 9/9 for further evaluations. Nurse also advised pt to report to ER if symptoms worsen or develop symptoms of feeling like she is going to pass out. Pt verbalized understanding.

## 2021-01-10 NOTE — Progress Notes (Signed)
Cardiology Office Note:    Date:  01/13/2021   ID:  Tamara Robertson, DOB 10/14/1929, MRN [id number]  PCP:  Pincus Sanes, MD  Cardiologist:  Rollene Rotunda, MD  Electrophysiologist:  None   Referring MD: Pincus Sanes, MD   Chief Complaint: dizziness  History of Present Illness:    Tamara Robertson is a 87 y.o. female with a history of non-obstructive CAD on cardiac catheterization in 01/2011, hypertension, dyslipidemia, hepatic steatosis with elevated LFTs, IBS, and immune thrombocytopenic purpura who is followed by Dr. Antoine Poche and presents today for follow-up of dizziness.   Patient was recently admitted from 01/05/2021 to 01/06/2021 new onset atrial fibrillation with RVR after presenting with palpitations. Rates were as high as the 160s. Labs remarkable for minimally elevated and flat high-sensitivity troponin (consistent with demand ischemia), elevated WBC of 18.7, platelets of 128. Chest x-ray showed subtle nodularity. She was started on IV Diltiazem with improvement in heart rates as well as antibiotics for possible pneumonia. Atrial fibrillation possible precipitated by recent steroid burst/taper and injection and or pneumonia. Thankfully, she converted with the IV Diltiazem and was noted to have mild sinus bradycardia with 1st degree AV block and intermittent pauses (all less than 2.5 seconds). IV Diltiazem was stopped and home Toprol-XL was increased to 25mg  daily. She was started on Eliquis. Echo showed LVEF of 60-65% with normal wall motion, grade 2 diastolic dysfunction, mild to moderate MR, mild to moderate AI, and mild TR. 30-Day Event Monitor was ordered at discharge.  Patient called our office on 01/10/2021 with concerns of dizziness. Therefore, this visit was arranged for further evaluation.  Here alone. She notes dizziness which is new since discharge. Mostly occurs in the morning about 1 hour after taking her medications. Occurs when walking and makes her feel very unsteady on  her feet and makes her feel uncomfortable driving. She states the dizziness will resolve around 3-4pm and then she can drive to the grocery store. She states she has had dizziness before but it has been a while and she thinks it may be due to her Eliquis. Discussed that I think it is more likely due to increased dose of Toprol-XL. No syncope or palpitations. She brings in a log of her BP and hear rates and these are well controlled. She denies any chest pain but states she has occasional chest heaviness, normally, in the morning. She states this is not new but is unable to elaborate on any details. No exertional chest pain. She has chronic intermittent shortness of breath with exertion which is stable. No orthopnea, PND, or edema. She also report legs numbness in her legs when walking from her PVD but states this is stable. She injured he right shoulder a few months ago and is being followed by Ortho. She is wondering if she could take Aleve because Tylenol is not helping.  Past Medical History:  Diagnosis Date   Anxiety    CAD (coronary artery disease)    RCA 40% stenosis cath 01/2011   Diverticular stricture [facility]) 2006   [facility]   DIVERTICULITIS, HX OF    Diverticulosis    DYSLIPIDEMIA    Elevated LFTs    GERD    Glaucoma    Hepatic steatosis    HOH (hard of hearing)    Immune thrombocytopenic purpura (HCC)    chronic - baseline 80-100K, on pred   Irritable bowel syndrome    Left ovarian cyst dx 01/2013 CT  working with gyn, ?malignant - elevated tumor marker OVA1   OSTEOARTHRITIS, KNEE, RIGHT    OSTEOPENIA    OVERACTIVE BLADDER    Prolapse of female bladder, acquired 11/2017   URINARY INCONTINENCE     Past Surgical History:  Procedure Laterality Date   ABDOMINAL HYSTERECTOMY  1963   APPENDECTOMY  1956   CARDIAC CATHETERIZATION     CATARACT EXTRACTION, BILATERAL  10/2010   CHOLECYSTECTOMY N/A 09/16/2014   Procedure: LAPAROSCOPIC CHOLECYSTECTOMY ;  Surgeon:  Emelia Loron, MD;  Location: [facility] OR;  Service: General;  Laterality: N/A;   KNEE ARTHROSCOPY Right    L pop PTA  10/2009   stent   LAPAROSCOPIC SIGMOID COLECTOMY  10/2005   SPLENECTOMY  1954   VARICOSE VEIN SURGERY Right 1962    Current Medications: Current Meds  Medication Sig   acetaminophen (TYLENOL) 500 MG tablet Take 500 mg by mouth every 6 (six) hours as needed.   apixaban (ELIQUIS) 5 MG TABS tablet Take 1 tablet (5 mg total) by mouth 2 (two) times daily.   Biotin 2500 MCG CAPS Take 1 capsule by mouth daily.    calcium carbonate (TUMS - DOSED IN MG ELEMENTAL CALCIUM) 500 MG chewable tablet Chew 1 tablet by mouth daily as needed for indigestion or heartburn.   cycloSPORINE (RESTASIS) 0.05 % ophthalmic emulsion 1 drop 2 (two) times daily.   Elastic Bandages & Supports (MEDICAL COMPRESSION STOCKINGS) MISC See admin instructions.   escitalopram (LEXAPRO) 5 MG tablet TAKE ONE TABLET BY MOUTH ONE TIME DAILY (Patient taking differently: Take 5 mg by mouth daily. TAKING 2.5 MG DAILY)   famotidine (PEPCID) 20 MG tablet Take 1 tablet (20 mg total) by mouth 2 (two) times daily as needed for heartburn or indigestion.   Flaxseed MISC Take 5 mLs by mouth daily. Take 1 tsp daily   furosemide (LASIX) 20 MG tablet Take 0.5 tablets (10 mg total) by mouth as needed.   Glucos-Chond-Hyal Ac-Ca Fructo (MOVE FREE JOINT HEALTH ADVANCE PO) Take by mouth.   Magnesium Oxide 250 MG TABS Take 1 tablet by mouth.   Multiple Vitamin (MULTIVITAMIN) tablet Take 1 tablet by mouth daily.     polyethylene glycol (MIRALAX / GLYCOLAX) packet Take 17 g by mouth daily as needed for moderate constipation.    predniSONE (DELTASONE) 2.5 MG tablet TAKE 1 TABLET BY MOUTH EVERY OTHER DAY IN THE MORNING (Patient taking differently: Take 2.5 mg by mouth See admin instructions. Every other day, in the morning)   Probiotic Product (PROBIOTIC DAILY PO) Take 1 tablet by mouth daily with breakfast.   psyllium (METAMUCIL) 58.6 %  packet Take 1 packet by mouth daily as needed.   PSYLLIUM PO Take 1 tablet by mouth every other day.   trolamine salicylate (ASPERCREME) 10 % cream Apply 1 application topically as needed for muscle pain.   [DISCONTINUED] metoprolol succinate (TOPROL-XL) 25 MG 24 hr tablet Take 1 tablet (25 mg total) by mouth daily.     Allergies:   Aspirin, Sulfa antibiotics, Contrast media [iodinated diagnostic agents], Fluoxetine, Celebrex [celecoxib], Celexa [citalopram], and Statins   Social History   Socioeconomic History   Marital status: Widowed    Spouse name: Not on file   Number of children: 2   Years of education: Not on file   Highest education level: Not on file  Occupational History   Occupation: Retired    Comment: Nutritional therapist office  Tobacco Use   Smoking status: Never   Smokeless tobacco: Never  Vaping Use   Vaping Use: Never used  Substance and Sexual Activity   Alcohol use: No    Alcohol/week: 0.0 standard drinks    Comment: rarely   Drug use: No   Sexual activity: Not Currently    Comment: 1st intercourse- 19, partners- 2,  widow  Other Topics Concern   Not on file  Social History Narrative   Retired Futures trader.    [place] Ham to [place] from [place] 05/2010 to be close to kids   Social Determinants of Corporate investment banker Strain: Low Risk    Difficulty of Paying Living Expenses: Not hard at all  Food Insecurity: No Food Insecurity   Worried About Programme researcher, broadcasting/film/video in the Last Year: Never true   Barista in the Last Year: Never true  Transportation Needs: No Transportation Needs   Lack of Transportation (Medical): No   Lack of Transportation (Non-Medical): No  Physical Activity: Inactive   Days of Exercise per Week: 0 days   Minutes of Exercise per Session: 0 min  Stress: No Stress Concern Present   Feeling of Stress : Not at all  Social Connections: Moderately Integrated   Frequency of Communication with Friends and Family: More than  three times a week   Frequency of Social Gatherings with Friends and Family: Once a week   Attends Religious Services: More than 4 times per year   Active Member of [employer] or Organizations: Yes   Attends Banker Meetings: More than 4 times per year   Marital Status: Widowed     Family History: The patient's family history includes Arthritis in an other family member; Coronary artery disease in her mother; Heart attack (age of onset: 73) in her mother; Hyperlipidemia in her daughter and mother; Hypertension in her daughter and mother; Stomach cancer in her father; Transient ischemic attack in an other family member. There is no history of Colon cancer.  ROS:   Please see the history of present illness.     EKGs/Labs/Other Studies Reviewed:    The following studies were reviewed today:  Myoview 01/29/2014: LV Ejection Fraction: 71 Rest ECG: NSR - Normal EKG   Stress ECG: No significant change from baseline ECG and There are scattered PVCs.   QPS Raw Data Images:  There is interference from nuclear activity from structures below the diaphragm. This does not affect the ability to read the study. Stress Images:  Normal homogeneous uptake in all areas of the myocardium. Rest Images:  Normal homogeneous uptake in all areas of the myocardium. Subtraction (SDS):  No evidence of ischemia. LV Wall Motion:  NL LV Function; NL Wall Motion   Impression: Exercise Capacity:  Lexiscan with no exercise. BP Response:  Normal blood pressure response. Clinical Symptoms:  No significant symptoms noted. ECG Impression:  No significant ECG changes with Lexiscan. Comparison with Prior Nuclear Study: No previous nuclear study performed   Overall Impression:  Normal stress nuclear study.  _______________  Echocardiogram 01/06/2021: Impressions: 1. Left ventricular ejection fraction, by estimation, is 60 to 65%. The  left ventricle has normal function. The left ventricle has no regional   wall motion abnormalities. Left ventricular diastolic parameters are  consistent with Grade II diastolic  dysfunction (pseudonormalization). There is basal septal hypertrophy  noted.   2. Right ventricular systolic function is normal. The right ventricular  size is normal. There is normal pulmonary artery systolic pressure.   3. The mitral valve is grossly normal.  Mild to moderate mitral valve  regurgitation. No evidence of mitral stenosis.   4. The aortic valve was not well visualized. Aortic valve regurgitation  is mild to moderate. No aortic stenosis is present. Aortic regurgitation  PHT measures 462 msec. No diastolic flow reversal.   5. Mild triscuspid regurgitation with two jets.   Comparison(s): A prior study was performed on 06/19/2017. Aortic  regurgitation noted; Aortic valve gradients decrease from those seen in  prior study.   EKG:  EKG ordered today. EKG personally reviewed and demonstrates normal sinus rhythm, rate 74 bpm, with no acute ST/T changes. Left axis deviation. Normal PR and QRS intervals. QTc .   Recent Labs: 12/14/2020: ALT 17 01/06/2021: BUN 23; Creatinine, Ser 0.89; Hemoglobin 12.3; Magnesium 2.1; Platelets PLATELET CLUMPS NOTED ON SMEAR, UNABLE TO ESTIMATE; Potassium 4.3; Sodium 136; TSH 1.984  Recent Lipid Panel    Component Value Date/Time   CHOL 190 12/14/2020 1616   CHOL 215 (H) 06/22/2014 0825   TRIG 262.0 (H) 12/14/2020 1616   TRIG 161 (H) 06/22/2014 0825   TRIG 228 02/20/2010 0000   HDL 46.00 12/14/2020 1616   HDL 51 06/22/2014 0825   CHOLHDL 4 12/14/2020 1616   VLDL 52.4 (H) 12/14/2020 1616   LDLCALC 107 (H) 05/23/2017 1151   LDLCALC 132 (H) 06/22/2014 0825   LDLDIRECT 118.0 12/14/2020 1616    Physical Exam:    Vital Signs: BP 120/68 (BP Location: Left Arm, Patient Position: Sitting, Cuff Size: Normal)   Pulse 74   Resp 20   Ht 5\' 1"  (1.549 m)   Wt 149 lb (67.6 kg)   SpO2 98%   BMI 28.15 kg/m     Wt Readings from Last 3  Encounters:  01/13/21 149 lb (67.6 kg)  01/05/21 149 lb 0.5 oz (67.6 kg)  12/14/20 149 lb (67.6 kg)     General: 87 y.o. Caucasian female in no acute distress. HEENT: Normocephalic and atraumatic. Sclera clear.  Neck: Supple. No JVD. Heart: RRR. Distinct S1 and S2. II/VI systolic murmur. No gallops or rubs. Radial pulses 2+ and equal bilaterally.  Lungs: No increased work of breathing. Clear to ausculation bilaterally. No wheezes, rhonchi, or rales.  Abdomen: Soft, non-distended, and non-tender to palpation.  Extremities: No lower extremity edema.    Skin: Warm and dry. Neuro: Alert and oriented x3. No focal deficits. Psych: Normal affect. Responds appropriately.  Assessment:    1. Dizziness   2. Immune thrombocytopenic purpura (HCC)   3. Paroxysmal atrial fibrillation (HCC)   4. Aortic valve insufficiency, etiology of cardiac valve disease unspecified   5. Mitral valve insufficiency, unspecified etiology   6. Tricuspid valve insufficiency, unspecified etiology   7. Primary hypertension   8. Hyperlipidemia, unspecified hyperlipidemia type   9. PVD (peripheral vascular disease) (HCC)     Plan:    Dizziness - Patient reports new dizziness that is worse in the morning and improves throughout the day. Toprol-XL was recently increased due to new onset atrial fibrillation. Denies any palpitations. - Vitals stable in office today. Orthostatic vital signs negative.  - Will try moving Toprol-XL dosing to the evening to see if that helps with her dizziness. If this does not help, may need to decrease Toprol-XL back to prior dose of 12.5mg  daily. - 30-Day Event Monitor has also been ordered but patient has not receive this yet. Will follow-up after monitor.  Paroxysmal Atrial Fibrillation - Recently admitted with paroxysmal atrial fibrillation with RVR after recent steroid use and probably  pneumonia. Converted back to sinus rhythm with IV Cardizem. - Maintaining sinus rhythm.  -  Continue Toprol-XL 25mg  daily. Patient instructed to start taking at night as above. - Continue chronic anticoagulation with Eliquis 5mg  twice daily. Advised patient not to take Aleve given need for anticoagulation. Recommended reaching out to Ortho to see if there are any other options for pay since Tylenol is not helping. - 30-Day Event Monitor pending.  Non-Obstructive CAD - Noted on cardiac catheterization in 2012. - She briefly mentioned very vague chest heaviness at rest. Unable to elaborate but no exertional chest pain or anything that sounds like angina. Advised patient to let us know if this worsens. No work-up needed at this time. - No aspirin due to need for DOAC. - Not on statin due to previous intolerance.  Mild to Moderate Aortic Regurgitation Mild to Moderate Mitral Regurgitation Mild Tricuspid Regurgitation - Noted on recent Echo earlier this month. - Will likely continue to follow only as needed given advanced age.  Hypertension - BP well controlled. - Continue Toprol-XL 25mg  daily. Also takes Lasix 10mg  daily as needed for edema.   Hyperlipidemia - No recent labs. LDL 107 in 2019. - Not on statin due to previous intolerance. - Was taking Flaxseed but patient going to stop now that she is on Eliquis.  - Will continue to focus lifestyle/dietary modification to promote LDL goal of <70 given CAD.  PVD - ABIs in 10/2020 suggestive of moderate disease on the right and mild disease on the left.  - She notes some bilateral lower extremity numbness when walking but states this is stable.  - Followed by Dr. Randie Heinz (Vascular Surgery).  Disposition: Follow up after Event Monitor.   Medication Adjustments/Labs and Tests Ordered: Current medicines are reviewed at length with the patient today.  Concerns regarding medicines are outlined above.  Orders Placed This Encounter  Procedures   CBC   EKG 12-Lead   Meds ordered this encounter  Medications   metoprolol succinate  (TOPROL-XL) 25 MG 24 hr tablet    Sig: Take 1 tablet (25 mg total) by mouth at bedtime.    Dispense:  30 tablet    Refill:  1    Patient Instructions  Medication Instructions:  Take Metoprolol Succinate (Toprol-XL) 25 mg at night   *If you need a refill on your cardiac medications before your next appointment, please call your pharmacy*  Lab Work: Your physician recommends that you return for lab work TODAY:  CBC If you have labs (blood work) drawn today and your tests are completely normal, you will receive your results only by: MyChart Message (if you have MyChart) OR A paper copy in the mail If you have any lab test that is abnormal or we need to change your treatment, we will call you to review the results.  Testing/Procedures: NONE ordered at this time of appointment   Follow-Up: At [facility], you and your health needs are our priority.  As part of our continuing mission to provide you with exceptional heart care, we have created designated Provider Care Teams.  These Care Teams include your primary Cardiologist (physician) and Advanced Practice Providers (APPs -  Physician Assistants and Nurse Practitioners) who all work together to provide you with the care you need, when you need it.  Your next appointment:   As scheduled    The format for your next appointment:   In Person  Provider:   Rollene Rotunda, MD  Other Instructions  Signed, Corrin Parker, PA-C  01/13/2021 1:54 PM    [facility]

## 2021-01-10 NOTE — Telephone Encounter (Signed)
This is not a side effect of Eliquis, this is the reason she is taking Eliquis. She presented to ED on 9/1 for palpitations and was found to be in afib and started on Eliquis. She should continue on therapy.

## 2021-01-10 NOTE — Telephone Encounter (Signed)
   Pt c/o medication issue:  1. Name of Medication: apixaban (ELIQUIS) 5 MG TABS tablet  2. How are you currently taking this medication (dosage and times per day)? Take 1 tablet (5 mg total) by mouth 2 (two) times daily.  3. Are you having a reaction (difficulty breathing--STAT)?   4. What is your medication issue? Pt said she is having side effects from this meds, she said she is having palpitations

## 2021-01-11 NOTE — Telephone Encounter (Signed)
[name], MD  You 3 hours ago (9:29 AM)   Agree with 9/9 appointment.  Please check to see if a monitor is on the way for her to wear post hospitalization.

## 2021-01-13 ENCOUNTER — Ambulatory Visit: Payer: Medicare HMO | Admitting: Student

## 2021-01-13 ENCOUNTER — Encounter: Payer: Self-pay | Admitting: Student

## 2021-01-13 ENCOUNTER — Other Ambulatory Visit: Payer: Self-pay

## 2021-01-13 VITALS — BP 120/68 | HR 74 | Resp 20 | Ht 61.0 in | Wt 149.0 lb

## 2021-01-13 DIAGNOSIS — I480 Paroxysmal atrial fibrillation: Secondary | ICD-10-CM | POA: Diagnosis not present

## 2021-01-13 DIAGNOSIS — I351 Nonrheumatic aortic (valve) insufficiency: Secondary | ICD-10-CM | POA: Diagnosis not present

## 2021-01-13 DIAGNOSIS — I340 Nonrheumatic mitral (valve) insufficiency: Secondary | ICD-10-CM | POA: Diagnosis not present

## 2021-01-13 DIAGNOSIS — I071 Rheumatic tricuspid insufficiency: Secondary | ICD-10-CM

## 2021-01-13 DIAGNOSIS — R42 Dizziness and giddiness: Secondary | ICD-10-CM | POA: Diagnosis not present

## 2021-01-13 DIAGNOSIS — E785 Hyperlipidemia, unspecified: Secondary | ICD-10-CM

## 2021-01-13 DIAGNOSIS — I739 Peripheral vascular disease, unspecified: Secondary | ICD-10-CM | POA: Diagnosis not present

## 2021-01-13 DIAGNOSIS — D693 Immune thrombocytopenic purpura: Secondary | ICD-10-CM

## 2021-01-13 DIAGNOSIS — I10 Essential (primary) hypertension: Secondary | ICD-10-CM | POA: Diagnosis not present

## 2021-01-13 MED ORDER — METOPROLOL SUCCINATE ER 25 MG PO TB24: 25.0000 mg | ORAL_TABLET | Freq: Every evening | ORAL | 1 refills | Status: DC

## 2021-01-13 NOTE — Patient Instructions (Signed)
Medication Instructions:  Take Metoprolol Succinate (Toprol-XL) 25 mg at night   *If you need a refill on your cardiac medications before your next appointment, please call your pharmacy*  Lab Work: Your physician recommends that you return for lab work TODAY:  CBC If you have labs (blood work) drawn today and your tests are completely normal, you will receive your results only by: [place] (if you have MyChart) OR A paper copy in the mail If you have any lab test that is abnormal or we need to change your treatment, we will call you to review the results.  Testing/Procedures: NONE ordered at this time of appointment   Follow-Up: At [facility], you and your health needs are our priority.  As part of our continuing mission to provide you with exceptional heart care, we have created designated Provider Care Teams.  These Care Teams include your primary Cardiologist (physician) and Advanced Practice Providers (APPs -  Physician Assistants and Nurse Practitioners) who all work together to provide you with the care you need, when you need it.  Your next appointment:   As scheduled    The format for your next appointment:   In Person  Provider:   [name], MD  Other Instructions

## 2021-01-14 LAB — CBC
Hematocrit: 41.7 % (ref 34.0–46.6)
Hemoglobin: 13.9 g/dL (ref 11.1–15.9)
MCH: 28.2 pg (ref 26.6–33.0)
MCHC: 33.3 g/dL (ref 31.5–35.7)
MCV: 85 fL (ref 79–97)
Platelets: 160 10*3/uL (ref 150–450)
RBC: 4.93 x10E6/uL (ref 3.77–5.28)
RDW: 13.2 % (ref 11.7–15.4)
WBC: 12.6 10*3/uL — ABNORMAL HIGH (ref 3.4–10.8)

## 2021-01-16 DIAGNOSIS — M25511 Pain in right shoulder: Secondary | ICD-10-CM | POA: Diagnosis not present

## 2021-01-16 DIAGNOSIS — M542 Cervicalgia: Secondary | ICD-10-CM | POA: Diagnosis not present

## 2021-01-16 DIAGNOSIS — M545 Low back pain, unspecified: Secondary | ICD-10-CM | POA: Diagnosis not present

## 2021-01-17 ENCOUNTER — Telehealth: Payer: Self-pay | Admitting: Student

## 2021-01-17 NOTE — Telephone Encounter (Signed)
Pt c/o medication issue:  1. Name of Medication: apixaban (ELIQUIS) 5 MG TABS tablet  2. How are you currently taking this medication (dosage and times per day)? 1 tablet twice a day  3. Are you having a reaction (difficulty breathing--STAT)? no  4. What is your medication issue? Patient states the medication has been making her have dizziness, and states her legs, calves, and feet feels similar to when she took statins. She would like Callie to call her back.

## 2021-01-17 NOTE — Telephone Encounter (Signed)
Spoke with pt. She state despite changing metoprolol dose to the evening, she is still experiencing lightheadedness when she walk. Pt also report bilateral ankle swelling and stiffness in her feet and legs. She also report occasional feeling of pins and needles in her calf area when she try to stand. She state sometimes she can barely walk.  Pt report she believe symptoms are all related to starting Eliquis. She state she is very sensitive to medication and this is the only thing that's new.   Will forward to PA and pharm D for recommendations

## 2021-01-17 NOTE — Telephone Encounter (Signed)
Those do not sound like side effects from Eliquis.  Very rarely LFTs and CK can increase on Eliquis.  Could consider ordering LFTs and CK.

## 2021-01-18 NOTE — Telephone Encounter (Signed)
I agree, I do not think these symptoms are from the Eliquis. We can try decreasing her Toprol-XL dose back to what she was on before (12.'5mg'$  once daily) to see if that helps her lightheadedness. If patient would like [place] to check LFTs and CK as Pharmacy suggested, I am OK with that. For her ankle swelling, I would recommend elevating legs when possible. She can also try compression stockings. I think the pins and needle sensation and difficulty walking at times is probably more likely due to her PVD which is followed by Vascular Surgery. If those symptoms continue, would recommend reaching out to them.  Thank you!

## 2021-01-19 DIAGNOSIS — M545 Low back pain, unspecified: Secondary | ICD-10-CM | POA: Diagnosis not present

## 2021-01-19 DIAGNOSIS — M25511 Pain in right shoulder: Secondary | ICD-10-CM | POA: Diagnosis not present

## 2021-01-19 DIAGNOSIS — M542 Cervicalgia: Secondary | ICD-10-CM | POA: Diagnosis not present

## 2021-01-19 NOTE — Progress Notes (Signed)
Subjective:    Patient ID: Tamara Robertson, female    DOB: July 03, 1929, 87 y.o.   MRN: [id number]  This visit occurred during the SARS-CoV-2 public health emergency.  Safety protocols were in place, including screening questions prior to the visit, additional usage of staff PPE, and extensive cleaning of exam room while observing appropriate contact time as indicated for disinfecting solutions.     HPI The patient is here for follow up from the hospital    Recommendations for Outpatient Follow-up:  Follow up with PCP in 1-2 weeks.  Recheck vital signs. Increased metoprolol and held losartan at discharge pending follow up. Recheck CBC. Eliquis started for stroke risk reduction in pt with stable ITP after conferring with her hematologist. Recommend repeat CXR in 6-8 weeks following antibiotic therapy and resolution of acute episode. Follow up with cardiology  has been arranged on 02/09/21 at 930 AM. 30 days event monitor ordered, staff will contact patient for placing it   She called 911 due to palps  -HR was 160's.  Diltiazem 20 mg IV in ED given.  EKG confirmed Afib with RVR.  She denied CP, SOB, abd pain, N/V/D.  She was on recent steroids.  She had a mild nonproductive cough.  No fever, wheeze.  Started on diltiazem gtt and HR improved.  Troponin 31-> 38.  WBC 18.7, plt 128.  Covid test neg.  CXR showed subtle nodularity.  Ceftriaxone and azithromycin.  Converted to NSR.      Afib - home metoprolol increased.  Eliquis started.  Possible cap - leukocytosis. 5 days empiric abx.   Needs repeat cxr  Htn - metoprolol increased to 25 mg.  Losartan held.     BP at home 113/61 - 188/85  HR 66-89   palps this morning - a little before today.  Has not been able to use the monitor yet because she is not sure how to carry it around with her all day.  Right leg only swollen - does not go away over night, but improved slightly.  This is the foot that she has the Achilles bursitis and and  that has flared up.  They are getting worse.  The swelling started after she got home.  She gained three lbs since being home - no change in eating.   Medications and allergies reviewed with patient and updated if appropriate.  Patient Active Problem List   Diagnosis Date Noted   Paroxysmal A-fib (HCC) 01/05/2021   Aortic atherosclerosis (HCC) 12/14/2020   Spinal stenosis of lumbar region with neurogenic claudication 12/14/2020   Melanocytic nevi of trunk 10/05/2020   Peripheral venous insufficiency 10/05/2020   Rosacea 10/05/2020   Inflamed seborrheic keratosis 10/05/2020   Temporal arteritis (HCC) 10/05/2020   Viral warts 10/05/2020   Ear pain, left 06/23/2020   Posterior neck pain 08/03/2019   Dizziness 08/03/2019   Diarrhea 03/13/2019   Nonrheumatic aortic valve stenosis 02/26/2019   Sensorineural hearing loss (SNHL), bilateral 01/30/2019   Chronic ITP (idiopathic thrombocytopenia) (HCC) 12/01/2018   Lightheadedness 11/19/2018   Epistaxis 11/19/2018   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   Rash 03/25/2018   Chronic left-sided headaches 01/28/2018   SBO (small bowel obstruction) (HCC) 09/20/2017   Aortic stenosis, moderate 08/22/2017   CAD (coronary artery disease) 08/21/2017   Community acquired pneumonia 06/25/2017   Current chronic use of systemic steroids 06/25/2017   Varicose veins of left lower extremity with complications 03/05/2017   Hypertension 07/31/2016   Leg edema 07/31/2016  Prediabetes 06/05/2016   Osteopenia 12/01/2015   Anxiety and depression 07/19/2015   Degenerative cervical disc 12/28/2014   Left ovarian cyst    Peripheral neuropathy 06/03/2012   PVD (peripheral vascular disease) (HCC) 11/23/2010   Bursitis of hip 10/26/2010   Hyperlipidemia 06/27/2010   Unspecified glaucoma 06/27/2010   GERD 06/27/2010   Irritable bowel syndrome 06/27/2010   OVERACTIVE BLADDER 06/27/2010   OSTEOARTHRITIS, KNEE, RIGHT 06/27/2010   URINARY INCONTINENCE  06/27/2010    Current Outpatient Medications on File Prior to Visit  Medication Sig Dispense Refill   acetaminophen (TYLENOL) 500 MG tablet Take 500 mg by mouth every 6 (six) hours as needed.     apixaban (ELIQUIS) 5 MG TABS tablet Take 1 tablet (5 mg total) by mouth 2 (two) times daily. 60 tablet 0   Biotin 2500 MCG CAPS Take 1 capsule by mouth daily.      calcium carbonate (TUMS - DOSED IN MG ELEMENTAL CALCIUM) 500 MG chewable tablet Chew 1 tablet by mouth daily as needed for indigestion or heartburn.     cycloSPORINE (RESTASIS) 0.05 % ophthalmic emulsion 1 drop 2 (two) times daily.     dorzolamide (TRUSOPT) 2 % ophthalmic solution 1 drop into affected eye     Elastic Bandages & Supports (MEDICAL COMPRESSION STOCKINGS) MISC See admin instructions.     escitalopram (LEXAPRO) 5 MG tablet TAKE ONE TABLET BY MOUTH ONE TIME DAILY (Patient taking differently: Take 5 mg by mouth daily. TAKING 2.5 MG DAILY) 30 tablet 0   famotidine (PEPCID) 20 MG tablet Take 1 tablet (20 mg total) by mouth 2 (two) times daily as needed for heartburn or indigestion. 60 tablet 3   Flaxseed MISC Take 5 mLs by mouth daily. Take 1 tsp daily     furosemide (LASIX) 20 MG tablet Take 0.5 tablets (10 mg total) by mouth as needed. 45 tablet 2   Glucos-Chond-Hyal Ac-Ca Fructo (MOVE FREE JOINT HEALTH ADVANCE PO) Take by mouth.     Magnesium Oxide 250 MG TABS Take 1 tablet by mouth.     metoprolol succinate (TOPROL-XL) 25 MG 24 hr tablet Take 1 tablet (25 mg total) by mouth at bedtime. 30 tablet 1   Multiple Vitamin (MULTIVITAMIN) tablet Take 1 tablet by mouth daily.       polyethylene glycol (MIRALAX / GLYCOLAX) packet Take 17 g by mouth daily as needed for moderate constipation.      predniSONE (DELTASONE) 2.5 MG tablet TAKE 1 TABLET BY MOUTH EVERY OTHER DAY IN THE MORNING (Patient taking differently: Take 2.5 mg by mouth See admin instructions. Every other day, in the morning) 45 tablet 0   Probiotic Product (PROBIOTIC DAILY  PO) Take 1 tablet by mouth daily with breakfast.     psyllium (METAMUCIL) 58.6 % packet Take 1 packet by mouth daily as needed.     PSYLLIUM PO Take 1 tablet by mouth every other day.     trolamine salicylate (ASPERCREME) 10 % cream Apply 1 application topically as needed for muscle pain.     [DISCONTINUED] metoprolol succinate (TOPROL-XL) 25 MG 24 hr tablet Take 1 tablet (25 mg total) by mouth daily. 30 tablet 1   No current facility-administered medications on file prior to visit.    Past Medical History:  Diagnosis Date   Anxiety    CAD (coronary artery disease)    RCA 40% stenosis cath 01/2011   Diverticular stricture [facility]) 2006   [facility]   DIVERTICULITIS, HX OF  Diverticulosis    DYSLIPIDEMIA    Elevated LFTs    GERD    Glaucoma    Hepatic steatosis    HOH (hard of hearing)    Immune thrombocytopenic purpura (HCC)    chronic - baseline 80-100K, on pred   Irritable bowel syndrome    Left ovarian cyst dx 01/2013 CT   working with gyn, ?malignant - elevated tumor marker OVA1   OSTEOARTHRITIS, KNEE, RIGHT    OSTEOPENIA    OVERACTIVE BLADDER    Prolapse of female bladder, acquired 11/2017   URINARY INCONTINENCE     Past Surgical History:  Procedure Laterality Date   ABDOMINAL HYSTERECTOMY  1963   APPENDECTOMY  1956   CARDIAC CATHETERIZATION     CATARACT EXTRACTION, BILATERAL  10/2010   CHOLECYSTECTOMY N/A 09/16/2014   Procedure: LAPAROSCOPIC CHOLECYSTECTOMY ;  Surgeon: Emelia Loron, MD;  Location: [facility] OR;  Service: General;  Laterality: N/A;   KNEE ARTHROSCOPY Right    L pop PTA  10/2009   stent   LAPAROSCOPIC SIGMOID COLECTOMY  10/2005   SPLENECTOMY  1954   VARICOSE VEIN SURGERY Right 1962    Social History   Socioeconomic History   Marital status: Widowed    Spouse name: Not on file   Number of children: 2   Years of education: Not on file   Highest education level: Not on file  Occupational History   Occupation: Retired     Comment: Nutritional therapist office  Tobacco Use   Smoking status: Never   Smokeless tobacco: Never  Vaping Use   Vaping Use: Never used  Substance and Sexual Activity   Alcohol use: No    Alcohol/week: 0.0 standard drinks    Comment: rarely   Drug use: No   Sexual activity: Not Currently    Comment: 1st intercourse- 19, partners- 2,  widow  Other Topics Concern   Not on file  Social History Narrative   Retired Futures trader.    [place] Ham to [place] from [place] 05/2010 to be close to kids   Social Determinants of Corporate investment banker Strain: Low Risk    Difficulty of Paying Living Expenses: Not hard at all  Food Insecurity: No Food Insecurity   Worried About Programme researcher, broadcasting/film/video in the Last Year: Never true   Barista in the Last Year: Never true  Transportation Needs: No Transportation Needs   Lack of Transportation (Medical): No   Lack of Transportation (Non-Medical): No  Physical Activity: Inactive   Days of Exercise per Week: 0 days   Minutes of Exercise per Session: 0 min  Stress: No Stress Concern Present   Feeling of Stress : Not at all  Social Connections: Moderately Integrated   Frequency of Communication with Friends and Family: More than three times a week   Frequency of Social Gatherings with Friends and Family: Once a week   Attends Religious Services: More than 4 times per year   Active Member of [employer] or Organizations: Yes   Attends Banker Meetings: More than 4 times per year   Marital Status: Widowed    Family History  Problem Relation Age of Onset   Coronary artery disease Mother    Heart attack Mother 1   Hyperlipidemia Mother    Hypertension Mother    Stomach cancer Father    Hypertension Daughter    Hyperlipidemia Daughter    Arthritis Other  parent   Transient ischemic attack Other        parent   Colon cancer Neg Hx     Review of Systems  Constitutional:  Positive for unexpected weight change (3 lbs  up for no reason). Negative for appetite change and fever.  Respiratory:  Positive for shortness of breath (once in a while). Negative for cough and wheezing.   Cardiovascular:  Positive for chest pain (no pain but heavinesss in sternum - a couple of times), palpitations and leg swelling.  Neurological:  Positive for light-headedness (chronic - maybe better) and headaches.      Objective:   Vitals:   01/20/21 1423  BP: (!) 160/68  Pulse: 92  Temp: 98.1 F (36.7 C)  SpO2: 97%   BP Readings from Last 3 Encounters:  01/20/21 (!) 160/68  01/13/21 120/68  01/06/21 (!) 144/55   Wt Readings from Last 3 Encounters:  01/20/21 151 lb (68.5 kg)  01/13/21 149 lb (67.6 kg)  01/05/21 149 lb 0.5 oz (67.6 kg)   Body mass index is 28.53 kg/m.   Physical Exam    Constitutional: Appears well-developed and well-nourished. No distress.  HENT:  Head: Normocephalic and atraumatic.  Neck: Neck supple. No tracheal deviation present. No thyromegaly present.  No cervical lymphadenopathy Cardiovascular: Normal rate, regular rhythm and normal heart sounds.   2/6 systolic murmur heard. No carotid bruit .  Right 1+ pitting edema at ankle and mild edema in foot no left lower extremity edema Pulmonary/Chest: Effort normal and breath sounds normal. No respiratory distress. No has no wheezes. No rales.  Skin: Skin is warm and dry. Not diaphoretic.  Psychiatric: Normal mood and affect. Behavior is normal.      Assessment & Plan:    See Problem List for Assessment and Plan of chronic medical problems.

## 2021-01-19 NOTE — Patient Instructions (Addendum)
   Chest xray ordered to confirm resolution of your pneumonia -- have this done mid to end of October.     Medications changes include :    try lasix 20 mg for a couple of days to see if that           helps with your swelling.    Restart losartan 25 mg daily for your elevated BP.   Ice your right heel.     Your prescription(s) have been submitted to your pharmacy. Please take as directed and contact our office if you believe you are having problem(s) with the medication(s).   Call cardiology - try to get a different monitor that is easier for you to use.

## 2021-01-19 NOTE — Telephone Encounter (Signed)
Pt informed of providers result & recommendations. Pt verbalized understanding. No further questions . She would like to sign up for transportation services. S/w transportation and she can sign the waiver after her first ride. Mychart email sent to inform pt of services

## 2021-01-20 ENCOUNTER — Telehealth: Payer: Self-pay | Admitting: Internal Medicine

## 2021-01-20 ENCOUNTER — Ambulatory Visit (INDEPENDENT_AMBULATORY_CARE_PROVIDER_SITE_OTHER): Payer: Medicare HMO | Admitting: Internal Medicine

## 2021-01-20 ENCOUNTER — Ambulatory Visit: Payer: Medicare HMO

## 2021-01-20 ENCOUNTER — Encounter: Payer: Self-pay | Admitting: Internal Medicine

## 2021-01-20 ENCOUNTER — Other Ambulatory Visit: Payer: Self-pay

## 2021-01-20 VITALS — BP 160/68 | HR 92 | Temp 98.1°F | Ht 61.0 in | Wt 151.0 lb

## 2021-01-20 DIAGNOSIS — K219 Gastro-esophageal reflux disease without esophagitis: Secondary | ICD-10-CM

## 2021-01-20 DIAGNOSIS — D693 Immune thrombocytopenic purpura: Secondary | ICD-10-CM | POA: Diagnosis not present

## 2021-01-20 DIAGNOSIS — M7661 Achilles tendinitis, right leg: Secondary | ICD-10-CM

## 2021-01-20 DIAGNOSIS — R600 Localized edema: Secondary | ICD-10-CM | POA: Diagnosis not present

## 2021-01-20 DIAGNOSIS — I480 Paroxysmal atrial fibrillation: Secondary | ICD-10-CM | POA: Diagnosis not present

## 2021-01-20 DIAGNOSIS — I10 Essential (primary) hypertension: Secondary | ICD-10-CM

## 2021-01-20 DIAGNOSIS — J189 Pneumonia, unspecified organism: Secondary | ICD-10-CM

## 2021-01-20 DIAGNOSIS — R197 Diarrhea, unspecified: Secondary | ICD-10-CM

## 2021-01-20 MED ORDER — LOSARTAN POTASSIUM 25 MG PO TABS
25.0000 mg | ORAL_TABLET | Freq: Every day | ORAL | 1 refills | Status: DC
Start: 2021-01-20 — End: 2021-06-27

## 2021-01-20 MED ORDER — ESCITALOPRAM OXALATE 5 MG PO TABS: 5.0000 mg | ORAL_TABLET | Freq: Every day | ORAL | 3 refills | Status: DC

## 2021-01-20 MED ORDER — FAMOTIDINE 20 MG PO TABS: 20.0000 mg | ORAL_TABLET | Freq: Two times a day (BID) | ORAL | 3 refills | Status: DC | PRN

## 2021-01-20 NOTE — Telephone Encounter (Signed)
Aritzel Geerdes DOB: Mar 07, 1930 MRN: [id number]   RIDER WAIVER AND RELEASE OF LIABILITY  For purposes of improving physical access to our facilities, [facility] is pleased to partner with third parties to provide [facility] patients or other authorized individuals the option of convenient, on-demand ground transportation services (the Chiropractor") through use of the technology service that enables users to request on-demand ground transportation from independent third-party providers.  By opting to use and accept these [employer], I, the undersigned, hereby agree on behalf of myself, and on behalf of any minor child using the Science writer for whom I am the parent or legal guardian, as follows:  Science writer provided to me are provided by independent third-party transportation providers who are not [employer] or employees and who are unaffiliated with [employer]. [facility] is neither a transportation carrier nor a common or public carrier. [facility] has no control over the quality or safety of the transportation that occurs as a result of the [employer]. [facility] cannot guarantee that any third-party transportation provider will complete any arranged transportation service. [facility] makes no representation, warranty, or guarantee regarding the reliability, timeliness, quality, safety, suitability, or availability of any of the Transport Services or that they will be error free. I fully understand that traveling by vehicle involves risks and dangers of serious bodily injury, including permanent disability, paralysis, and death. I agree, on behalf of myself and on behalf of any minor child using the Transport Services for whom I am the parent or legal guardian, that the entire risk arising out of my use of the [employer] remains solely with me, to the maximum extent permitted under applicable law. The [employer] are provided  "as is" and "as available." [facility] disclaims all representations and warranties, express, implied or statutory, not expressly set out in these terms, including the implied warranties of merchantability and fitness for a particular purpose. I hereby waive and release [facility], its agents, employees, officers, directors, representatives, insurers, attorneys, assigns, successors, subsidiaries, and affiliates from any and all past, present, or future claims, demands, liabilities, actions, causes of action, or suits of any kind directly or indirectly arising from acceptance and use of the [employer]. I further waive and release [facility] and its affiliates from all present and future liability and responsibility for any injury or death to persons or damages to property caused by or related to the use of the [employer]. I have read this Waiver and Release of Liability, and I understand the terms used in it and their legal significance. This Waiver is freely and voluntarily given with the understanding that my right (as well as the right of any minor child for whom I am the parent or legal guardian using the [employer]) to legal recourse against  in connection with the [employer] is knowingly surrendered in return for use of these services.   I attest that I read the consent document to Rudell Cobb, gave Ms. Remus the opportunity to ask questions and answered the questions asked (if any). I affirm that Rudell Cobb then provided consent for she's participation in this program.      Launa Grill

## 2021-01-20 NOTE — Assessment & Plan Note (Signed)
Chronic Blood pressure has been elevated at home and is elevated here today Continue metoprolol XL 25 mg daily and restart losartan at 25 mg daily

## 2021-01-20 NOTE — Assessment & Plan Note (Signed)
Acute Completed antibiotics and hospitalization-there was some debate as to whether she did have pneumonia or not Will repeat chest x-ray mid-end of October

## 2021-01-20 NOTE — Assessment & Plan Note (Signed)
Chronic Has seen podiatry and orthopedics Unable to take NSAIDs due to being on Eliquis She will ice regularly

## 2021-01-20 NOTE — Assessment & Plan Note (Signed)
Chronic Controlled Continue famotidine 20 mg twice daily as needed

## 2021-01-20 NOTE — Assessment & Plan Note (Signed)
New since her most recent hospitalization Has had intermittent palpitations Heart rate has been controlled Continue metoprolol XL 25 mg daily Continue Eliquis 5 mg twice daily Will call cardiology to see if she can get a different monitor if that is easier for her to wear Follow-up with cardiology

## 2021-01-20 NOTE — Assessment & Plan Note (Signed)
Acute on chronic Right leg is swollen, left leg is not Taking Lasix 10 mg daily-advised that she can take 20 mg for a couple of days to see if that helps and then go back to 10 mg daily

## 2021-01-20 NOTE — Assessment & Plan Note (Signed)
Chronic Controlled Continue prednisone 2.5 mg daily

## 2021-01-20 NOTE — Assessment & Plan Note (Signed)
Chronic Controlled with psyllium powder alternating with Metamucil every other day

## 2021-01-23 DIAGNOSIS — M545 Low back pain, unspecified: Secondary | ICD-10-CM | POA: Diagnosis not present

## 2021-01-23 DIAGNOSIS — I480 Paroxysmal atrial fibrillation: Secondary | ICD-10-CM

## 2021-01-23 DIAGNOSIS — M25511 Pain in right shoulder: Secondary | ICD-10-CM | POA: Diagnosis not present

## 2021-01-23 DIAGNOSIS — M542 Cervicalgia: Secondary | ICD-10-CM | POA: Diagnosis not present

## 2021-01-25 DIAGNOSIS — M25511 Pain in right shoulder: Secondary | ICD-10-CM | POA: Diagnosis not present

## 2021-01-26 NOTE — Telephone Encounter (Signed)
Acknowledge notes below, agree with Zio if Dr Clarice Pole with this.

## 2021-01-27 ENCOUNTER — Ambulatory Visit: Payer: Medicare HMO

## 2021-01-27 DIAGNOSIS — I480 Paroxysmal atrial fibrillation: Secondary | ICD-10-CM

## 2021-01-27 NOTE — Addendum Note (Signed)
Addended by: Beatrix Fetters on: 01/27/2021 09:35 AM   Modules accepted: Orders

## 2021-01-27 NOTE — Progress Notes (Unsigned)
Enrolled patient for a 14 day Zio XT  monitor to be mailed to patients home  °

## 2021-01-28 ENCOUNTER — Other Ambulatory Visit: Payer: Self-pay | Admitting: Internal Medicine

## 2021-01-31 ENCOUNTER — Encounter: Payer: Self-pay | Admitting: Internal Medicine

## 2021-01-31 ENCOUNTER — Ambulatory Visit
Admission: RE | Admit: 2021-01-31 | Discharge: 2021-01-31 | Disposition: A | Discharge status: Home / Self Care | Payer: Medicare HMO | Source: Ambulatory Visit | Attending: Internal Medicine | Admitting: Internal Medicine

## 2021-01-31 ENCOUNTER — Other Ambulatory Visit: Payer: Self-pay

## 2021-01-31 DIAGNOSIS — L309 Dermatitis, unspecified: Secondary | ICD-10-CM | POA: Diagnosis not present

## 2021-01-31 DIAGNOSIS — Z1231 Encounter for screening mammogram for malignant neoplasm of breast: Secondary | ICD-10-CM | POA: Diagnosis not present

## 2021-02-02 MED ORDER — APIXABAN 5 MG PO TABS: 5.0000 mg | ORAL_TABLET | Freq: Two times a day (BID) | ORAL | 5 refills | Status: DC

## 2021-02-08 NOTE — Progress Notes (Signed)
Cardiology Office Note   Date:  02/09/2021   ID:  [name], Tamara Robertson, MRN [id number]   PCP:  Pincus Sanes, MD  Cardiologist:   Rollene Rotunda, MD   Chief Complaint  Patient presents with   Palpitations      History of Present Illness: Tamara Robertson is a 87 y.o. female who presents for evaluation of PT.  She has had non obstructive CAD.    She had remote PTA in [place], and chronic LE edema.     She was in the hospital recently with new onset atrial fibrillation with rapid ventricular response.  He had demand ischemia with elevated troponins.  She was treated with IV diltiazem and also antibiotics for possible pneumonia.  He converted to spontaneous sinus rhythm.  He had some sinus bradycardia with first-degree AV block and some 2.5-second pauses.  She was sent home on a low-dose beta-blocker and Eliquis.  Her echo demonstrated EF of 60 to 65% with normal wall motion.  She did have some dizziness.  She was to receive a 30-day event monitor.  Because of dizziness we did move her beta-blocker to the evening.  She is unfortunately still getting dizzy.  She is not getting them at night.  She is getting palpitations with mild activity.  She gets some shortness of breath.  She was not able to wear the monitor in part because of some rash.  She has a diffuse rash that started 2 days after the Eliquis.  She has not been able to capture any tachypalpitations on her blood pressure cuff.  She is not having any chest pressure, neck or arm discomfort.  She has had no weight gain.  She is having continued pain in her shoulder on the right and also some swelling in her right ankle.      Past Medical History:  Diagnosis Date   Anxiety    CAD (coronary artery disease)    RCA 40% stenosis cath 01/2011   Diverticular stricture [facility]) 2006   [facility]   DIVERTICULITIS, HX OF    Diverticulosis    DYSLIPIDEMIA    Elevated LFTs    GERD    Glaucoma     Hepatic steatosis    HOH (hard of hearing)    Immune thrombocytopenic purpura (HCC)    chronic - baseline 80-100K, on pred   Irritable bowel syndrome    Left ovarian cyst dx 01/2013 CT   working with gyn, ?malignant - elevated tumor marker OVA1   OSTEOARTHRITIS, KNEE, RIGHT    OSTEOPENIA    OVERACTIVE BLADDER    Prolapse of female bladder, acquired 11/2017   URINARY INCONTINENCE     Past Surgical History:  Procedure Laterality Date   ABDOMINAL HYSTERECTOMY  1963   APPENDECTOMY  1956   CARDIAC CATHETERIZATION     CATARACT EXTRACTION, BILATERAL  10/2010   CHOLECYSTECTOMY N/A 09/16/2014   Procedure: LAPAROSCOPIC CHOLECYSTECTOMY ;  Surgeon: Emelia Loron, MD;  Location: [facility] OR;  Service: General;  Laterality: N/A;   KNEE ARTHROSCOPY Right    L pop PTA  10/2009   stent   LAPAROSCOPIC SIGMOID COLECTOMY  10/2005   SPLENECTOMY  1954   VARICOSE VEIN SURGERY Right 1962     Current Outpatient Medications  Medication Sig Dispense Refill   acetaminophen (TYLENOL) 500 MG tablet Take 500 mg by mouth every 6 (six) hours as needed.     Biotin 2500 MCG CAPS Take 1 capsule  by mouth daily.      calcium carbonate (TUMS - DOSED IN MG ELEMENTAL CALCIUM) 500 MG chewable tablet Chew 1 tablet by mouth daily as needed for indigestion or heartburn.     cycloSPORINE (RESTASIS) 0.05 % ophthalmic emulsion 1 drop 2 (two) times daily.     dorzolamide (TRUSOPT) 2 % ophthalmic solution 1 drop into affected eye     Elastic Bandages & Supports (MEDICAL COMPRESSION STOCKINGS) MISC See admin instructions.     escitalopram (LEXAPRO) 5 MG tablet Take 1 tablet (5 mg total) by mouth daily. 90 tablet 3   famotidine (PEPCID) 20 MG tablet Take 1 tablet (20 mg total) by mouth 2 (two) times daily as needed for heartburn or indigestion. 180 tablet 3   Flaxseed MISC Take 5 mLs by mouth daily. Take 1 tsp daily     furosemide (LASIX) 20 MG tablet TAKE 0.5 TABLETS (10 MG TOTAL) BY MOUTH AS NEEDED. 45 tablet 2    Glucos-Chond-Hyal Ac-Ca Fructo (MOVE FREE JOINT HEALTH ADVANCE PO) Take by mouth.     losartan (COZAAR) 25 MG tablet Take 1 tablet (25 mg total) by mouth daily. 90 tablet 1   Magnesium Oxide 250 MG TABS Take 1 tablet by mouth.     metoprolol succinate (TOPROL-XL) 25 MG 24 hr tablet Take 1 tablet (25 mg total) by mouth at bedtime. 30 tablet 1   Multiple Vitamin (MULTIVITAMIN) tablet Take 1 tablet by mouth daily.       polyethylene glycol (MIRALAX / GLYCOLAX) packet Take 17 g by mouth daily as needed for moderate constipation.      predniSONE (DELTASONE) 2.5 MG tablet TAKE 1 TABLET BY MOUTH EVERY OTHER DAY IN THE MORNING (Patient taking differently: Take 2.5 mg by mouth See admin instructions. Every other day, in the morning) 45 tablet 0   Probiotic Product (PROBIOTIC DAILY PO) Take 1 tablet by mouth daily with breakfast.     psyllium (METAMUCIL) 58.6 % packet Take 1 packet by mouth daily as needed.     PSYLLIUM PO Take 1 tablet by mouth every other day.     trolamine salicylate (ASPERCREME) 10 % cream Apply 1 application topically as needed for muscle pain.     No current facility-administered medications for this visit.    Allergies:   Aspirin, Sulfa antibiotics, Contrast media [iodinated diagnostic agents], Eliquis [apixaban], Fluoxetine, Celebrex [celecoxib], Celexa [citalopram], and Statins    ROS:  Please see the history of present illness.   Otherwise, review of systems are positive for none.   All other systems are reviewed and negative.     PHYSICAL EXAM: VS:  BP (!) 145/68   Pulse 84   Ht 5\' 1"  (1.549 m)   Wt 153 lb (69.4 kg)   SpO2 96%   BMI 28.91 kg/m  , BMI Body mass index is 28.91 kg/m. GENERAL:  Well appearing NECK:  No jugular venous distention, waveform within normal limits, carotid upstroke brisk and symmetric, no bruits, no thyromegaly LUNGS:  Clear to auscultation bilaterally CHEST:  Unremarkable HEART:  PMI not displaced or sustained,S1 and S2 within normal  limits, no S3, no S4, no clicks, no rubs, 2 out of 6 apical systolic murmur radiating slightly at the aortic outflow tract, no diastolic murmurs ABD:  Flat, positive bowel sounds normal in frequency in pitch, no bruits, no rebound, no guarding, no midline pulsatile mass, no hepatomegaly, no splenomegaly EXT:  2 plus pulses throughout, no edema, no cyanosis no clubbing  EKG:  EKG is  ordered today. Sinus rhythm, rate 84, axis within normal limits, intervals within normal limits, no acute ST-T wave changes.  Recent Labs: 12/14/2020: ALT 17 01/06/2021: BUN 23; Creatinine, Ser 0.89; Magnesium 2.1; Potassium 4.3; Sodium 136; TSH 1.984 01/13/2021: Hemoglobin 13.9; Platelets 160    Lipid Panel    Component Value Date/Time   CHOL 190 12/14/2020 1616   CHOL 215 (H) 06/22/2014 0825   TRIG 262.0 (H) 12/14/2020 1616   TRIG 161 (H) 06/22/2014 0825   TRIG 228 02/20/2010 0000   HDL 46.00 12/14/2020 1616   HDL 51 06/22/2014 0825   CHOLHDL 4 12/14/2020 1616   VLDL 52.4 (H) 12/14/2020 1616   LDLCALC 107 (H) 05/23/2017 1151   LDLCALC 132 (H) 06/22/2014 0825   LDLDIRECT 118.0 12/14/2020 1616      Wt Readings from Last 3 Encounters:  02/09/21 153 lb (69.4 kg)  01/20/21 151 lb (68.5 kg)  01/13/21 149 lb (67.6 kg)      Other studies Reviewed: Additional studies/ records that were reviewed today include: Monitor, BP diary. Review of the above records demonstrates:  NA   ASSESSMENT AND PLAN:   Dizziness We are going to try to send her a hypoallergenic monitor to wear to see if we can correlate any of these symptoms with atrial fibrillation.  She might need a slightly higher titration of her beta-blocker but we think she has been sensitive to this.  She needs a pulse ox.    Paroxysmal Atrial Fibrillation I am going to get her to wear the hypoallergenic monitor.  Because of the rash which I suspect might be Eliquis I will stop the Eliquis.  I would like to try to capture her palpitations to see if  they are fibrillation.  She might have been having this only because of pneumonia and might not need long-term anticoagulation.  She cannot take aspirin she was told not to use this because of thrombocytopenia.  Non-Obstructive CAD This was non obstructive in the past.  I do not strongly suspect ischemic heart disease but would consider stress testing if I can find no other etiology for her shortness of breath.   Mild to Moderate Aortic Regurgitation Mild to Moderate Mitral Regurgitation Mild Tricuspid Regurgitation She had an echo last month.  I will follow this clinically.  Hypertension I reviewed her blood pressure diary and her pressures were within target.  Hyperlipidemia She has been intolerant of statins.  Thrombocytopenia I sent a message to the oncologist that she is having trouble getting through to their scheduling and needs follow-up.    current medicines are reviewed at length with the patient today.  The patient does not have concerns regarding medicines.  The following changes have been made:  As above  Labs/ tests ordered today include: None  Orders Placed This Encounter  Procedures   EKG 12-Lead      Disposition:   FU with me or APP after the monitor.     Signed, Rollene Rotunda, MD  02/09/2021 10:16 AM    [facility]

## 2021-02-09 ENCOUNTER — Other Ambulatory Visit: Payer: Self-pay

## 2021-02-09 ENCOUNTER — Encounter: Payer: Self-pay | Admitting: *Deleted

## 2021-02-09 ENCOUNTER — Ambulatory Visit: Payer: Medicare HMO | Admitting: Cardiology

## 2021-02-09 ENCOUNTER — Encounter: Payer: Self-pay | Admitting: Cardiology

## 2021-02-09 ENCOUNTER — Ambulatory Visit (INDEPENDENT_AMBULATORY_CARE_PROVIDER_SITE_OTHER): Payer: Medicare HMO

## 2021-02-09 VITALS — BP 145/68 | HR 84 | Ht 61.0 in | Wt 153.0 lb

## 2021-02-09 DIAGNOSIS — R42 Dizziness and giddiness: Secondary | ICD-10-CM

## 2021-02-09 DIAGNOSIS — I480 Paroxysmal atrial fibrillation: Secondary | ICD-10-CM

## 2021-02-09 NOTE — Progress Notes (Unsigned)
Patient enrolled for [employer] to ship a 14 day long term holter monitor with bridge and 59m re-positionable electrodes.

## 2021-02-09 NOTE — Progress Notes (Signed)
Patient ID: Tamara Robertson, female   DOB: 1930-02-25, 87 y.o.   MRN: [id number] Patient required sensitive skin alternative for her 14 day Long term holter monitor. 01/27/21 enrollment for ZIO XT patch cancelled.  Patient will return to [facility] unused. Patient enrolled for [employer] to ship a 14 day long term holter monitor with a bridge and 27M re-positionable electrodes.

## 2021-02-09 NOTE — Patient Instructions (Signed)
Medication Instructions:  STOP ELIQUIS   *If you need a refill on your cardiac medications before your next appointment, please call your pharmacy*  Lab Work: NONE  Testing/Procedure CALL THE NUMBER ON THE BOX OF YOUR MONITOR AND ASK THEM TO MAIL YOU HYPOALLERGENIC ELECTRODES   Follow-Up: KEEP YOUR November APPOINTMENT   Other Instructions GET A PULSE OX AND LOG YOUR O2 SAT AND PULSE

## 2021-02-10 ENCOUNTER — Ambulatory Visit: Payer: Medicare HMO | Admitting: Internal Medicine

## 2021-02-10 ENCOUNTER — Telehealth: Payer: Self-pay | Admitting: Oncology

## 2021-02-10 NOTE — Telephone Encounter (Signed)
Scheduled per sch msg. Called and spoke with patient. Confirmed appt  

## 2021-02-14 DIAGNOSIS — M545 Low back pain, unspecified: Secondary | ICD-10-CM | POA: Diagnosis not present

## 2021-02-14 DIAGNOSIS — M722 Plantar fascial fibromatosis: Secondary | ICD-10-CM | POA: Diagnosis not present

## 2021-02-14 DIAGNOSIS — M7730 Calcaneal spur, unspecified foot: Secondary | ICD-10-CM | POA: Insufficient documentation

## 2021-02-14 DIAGNOSIS — M898X7 Other specified disorders of bone, ankle and foot: Secondary | ICD-10-CM | POA: Diagnosis not present

## 2021-02-14 DIAGNOSIS — M25511 Pain in right shoulder: Secondary | ICD-10-CM | POA: Insufficient documentation

## 2021-02-14 DIAGNOSIS — M67879 Other specified disorders of synovium and tendon, unspecified ankle and foot: Secondary | ICD-10-CM | POA: Diagnosis not present

## 2021-02-14 DIAGNOSIS — M7660 Achilles tendinitis, unspecified leg: Secondary | ICD-10-CM | POA: Insufficient documentation

## 2021-02-14 DIAGNOSIS — M79671 Pain in right foot: Secondary | ICD-10-CM | POA: Diagnosis not present

## 2021-02-15 DIAGNOSIS — M25511 Pain in right shoulder: Secondary | ICD-10-CM | POA: Diagnosis not present

## 2021-02-16 ENCOUNTER — Telehealth: Payer: Self-pay

## 2021-02-16 NOTE — Progress Notes (Signed)
Chronic Care Management Pharmacy Assistant   Name: Keylah Zimmel MRN: [id number] DOB: May 02, 1930  Reason for Encounter: Disease State - Hypertension  Recent office visits:  12/14/20 Lawerance Bach (PCP) - F/u Primary hypertension.  Modifiy Losartan Pot. 25 mg 1x day. Take Prednisone 2.5 mg in the am. D/c Dorzolamide & Multi-vitamin.  Recent consult visits:  12/13/20 [place] (Podiatry) - Pain due to onychomycosis of toenails of both feet. No med changes.  12/12/20 Wang (Rehab) - Spinal stenosis, lumbar region with neurogenic claudication.  12/07/20 Chandler (Ortho Surgery) - Unspecified disorder of synovium and tendon, right shoulder.  12/05/20 Alben Spittle (Ophthalmology) - Primary open-angle glaucoma, bilateral, indeterminate stage.   11/28/20 Dumonski (Orthopedic Surgery) - Spinal stenosis, lumbar region with neurogenic claudication.  11/10/20 Farris Has (Sports Med) - Low back pain, unspecified.   10/31/20 Dumonski (Orthopedic Surgery) - Spinal stenosis, lumbar region with neurogenic claudication.  Hospital visits:  Medication Reconciliation was completed by comparing discharge summary, patient's EMR and Pharmacy list, and upon discussion with patient.  Admitted to the hospital on 01/05/21 due to Atrial fib with RVR.[name] Discharge date was 01/06/21. Discharged from [facility].    New?Medications Started at [facility] Discharge:?? START taking: apixaban Everlene Balls)  Medication Changes at Hospital Discharge: CHANGE how you take: losartan (COZAAR) metoprolol succinate  Medications that remain the same after Hospital Discharge:??  -All other medications will remain the same.    Medications: Outpatient Encounter Medications as of 02/16/2021  Medication Sig   acetaminophen (TYLENOL) 500 MG tablet Take 500 mg by mouth every 6 (six) hours as needed.   Biotin 2500 MCG CAPS Take 1 capsule by mouth daily.    calcium carbonate (TUMS - DOSED IN MG ELEMENTAL CALCIUM) 500 MG chewable tablet Chew 1 tablet  by mouth daily as needed for indigestion or heartburn.   cycloSPORINE (RESTASIS) 0.05 % ophthalmic emulsion 1 drop 2 (two) times daily.   dorzolamide (TRUSOPT) 2 % ophthalmic solution 1 drop into affected eye   Elastic Bandages & Supports (MEDICAL COMPRESSION STOCKINGS) MISC See admin instructions.   escitalopram (LEXAPRO) 5 MG tablet Take 1 tablet (5 mg total) by mouth daily.   famotidine (PEPCID) 20 MG tablet Take 1 tablet (20 mg total) by mouth 2 (two) times daily as needed for heartburn or indigestion.   Flaxseed MISC Take 5 mLs by mouth daily. Take 1 tsp daily   furosemide (LASIX) 20 MG tablet TAKE 0.5 TABLETS (10 MG TOTAL) BY MOUTH AS NEEDED.   Glucos-Chond-Hyal Ac-Ca Fructo (MOVE FREE JOINT HEALTH ADVANCE PO) Take by mouth.   losartan (COZAAR) 25 MG tablet Take 1 tablet (25 mg total) by mouth daily.   Magnesium Oxide 250 MG TABS Take 1 tablet by mouth.   metoprolol succinate (TOPROL-XL) 25 MG 24 hr tablet Take 1 tablet (25 mg total) by mouth at bedtime.   Multiple Vitamin (MULTIVITAMIN) tablet Take 1 tablet by mouth daily.     polyethylene glycol (MIRALAX / GLYCOLAX) packet Take 17 g by mouth daily as needed for moderate constipation.    predniSONE (DELTASONE) 2.5 MG tablet TAKE 1 TABLET BY MOUTH EVERY OTHER DAY IN THE MORNING (Patient taking differently: Take 2.5 mg by mouth See admin instructions. Every other day, in the morning)   Probiotic Product (PROBIOTIC DAILY PO) Take 1 tablet by mouth daily with breakfast.   psyllium (METAMUCIL) 58.6 % packet Take 1 packet by mouth daily as needed.   PSYLLIUM PO Take 1 tablet by mouth every other day.   trolamine salicylate (  ASPERCREME) 10 % cream Apply 1 application topically as needed for muscle pain.   [DISCONTINUED] metoprolol succinate (TOPROL-XL) 25 MG 24 hr tablet Take 1 tablet (25 mg total) by mouth daily.   No facility-administered encounter medications on file as of 02/16/2021.    Reviewed chart prior to disease state call. Spoke  with patient regarding BP  Recent Office Vitals: BP Readings from Last 3 Encounters:  02/09/21 (!) 145/68  01/20/21 (!) 160/68  01/13/21 120/68   Pulse Readings from Last 3 Encounters:  02/09/21 84  01/20/21 92  01/13/21 74    Wt Readings from Last 3 Encounters:  02/09/21 153 lb (69.4 kg)  01/20/21 151 lb (68.5 kg)  01/13/21 149 lb (67.6 kg)     Kidney Function Lab Results  Component Value Date/Time   CREATININE 0.89 01/06/2021 03:31 AM   CREATININE 0.81 01/05/2021 02:55 PM   CREATININE 0.82 01/20/2020 03:25 PM   CREATININE 0.8 02/16/2016 03:26 PM   CREATININE 0.8 07/01/2015 02:24 PM   GFR 54.58 (L) 12/14/2020 04:16 PM   GFRNONAA >60 01/06/2021 03:31 AM   GFRNONAA 64 01/20/2020 03:25 PM   GFRAA 74 01/20/2020 03:25 PM    BMP Latest Ref Rng & Units 01/06/2021 01/05/2021 12/14/2020  Glucose 70 - 99 mg/dL 161(W) 960(A) 76  BUN 8 - 23 mg/dL 23 20 22   Creatinine 0.44 - 1.00 mg/dL 5.40 9.81 1.91  BUN/Creat Ratio 6 - 22 (calc) - - -  Sodium 135 - 145 mmol/L 136 141 137  Potassium 3.5 - 5.1 mmol/L 4.3 4.1 4.0  Chloride 98 - 111 mmol/L 105 109 100  CO2 22 - 32 mmol/L 23 24 32  Calcium 8.9 - 10.3 mg/dL 8.2(L) 8.6(L) 9.0    Current antihypertensive regimen:  Losartan 25 mg daily Furosemide 20 mg - 1/2 tab daily   How often are you checking your Blood Pressure?  Patient states she is currently not checking her BP, she is currently wearing a heart monitor that reports to her cardiologists. Patient states she feels fine and knows when her BP is out of range.  Current home BP readings:  No readings recorded  What recent interventions/DTPs have been made by any provider to improve Blood Pressure control since last CPP Visit:  None noted  Any recent hospitalizations or ED visits since last visit with CPP?  No  What diet changes have been made to improve Blood Pressure Control?  Patient states no recent diet changes but she does add salt to her food at times.  What exercise is  being done to improve your Blood Pressure Control?  Patient states no exercising at this time.  Adherence Review: Is the patient currently on ACE/ARB medication? Yes Does the patient have >5 day gap between last estimated fill dates? No  Star Rating Drugs: Losartan - filled 11/21/20 90D  Patient states she needs a refill on metoprolol & prednisone. She states she has been taking 1 tab daily of Lasix due to swelling and her feet are still swollen. Patient states she would like to be seen to address the swelling. Patient states she is no longer taking Eliquis, it was causing her to break out in hives. Patient also has concerns about a CT scan ordered by Dr. Lawerance Bach.  Benedict Needy, RMA Clinical Pharmacists Assistant [phone]

## 2021-02-18 DIAGNOSIS — I480 Paroxysmal atrial fibrillation: Secondary | ICD-10-CM

## 2021-02-20 ENCOUNTER — Other Ambulatory Visit: Payer: Self-pay | Admitting: Radiology

## 2021-02-20 ENCOUNTER — Ambulatory Visit (INDEPENDENT_AMBULATORY_CARE_PROVIDER_SITE_OTHER): Payer: Medicare HMO

## 2021-02-20 DIAGNOSIS — J189 Pneumonia, unspecified organism: Secondary | ICD-10-CM

## 2021-02-20 DIAGNOSIS — J439 Emphysema, unspecified: Secondary | ICD-10-CM | POA: Diagnosis not present

## 2021-02-22 ENCOUNTER — Encounter: Payer: Self-pay | Admitting: Internal Medicine

## 2021-02-22 DIAGNOSIS — R918 Other nonspecific abnormal finding of lung field: Secondary | ICD-10-CM

## 2021-02-23 ENCOUNTER — Other Ambulatory Visit: Payer: Self-pay | Admitting: Internal Medicine

## 2021-02-23 ENCOUNTER — Other Ambulatory Visit: Payer: Self-pay | Admitting: Oncology

## 2021-02-23 DIAGNOSIS — D693 Immune thrombocytopenic purpura: Secondary | ICD-10-CM

## 2021-02-23 MED ORDER — PREDNISONE 2.5 MG PO TABS: 2.5000 mg | ORAL_TABLET | ORAL | 3 refills | Status: DC

## 2021-02-23 NOTE — Progress Notes (Signed)
Called patient's cardiologists office (Dr. Percival Spanish) to request refill on Metoprolol 25 mg, left a detailed voice message on the nurse refill line.  Orinda Kenner, [place] Clinical Pharmacists Assistant [phone]

## 2021-02-23 NOTE — Telephone Encounter (Signed)
Patient requests refill for prednisone 2.5 mg daily. Last prescribed 11/22/20 x 90 day supply.  Preferred pharmacy: [facility], [place] [address] Phone: [phone] Fax: [phone]

## 2021-02-24 ENCOUNTER — Inpatient Hospital Stay: Payer: Medicare HMO

## 2021-02-24 ENCOUNTER — Inpatient Hospital Stay: Payer: Medicare HMO | Attending: Oncology | Admitting: Oncology

## 2021-02-24 ENCOUNTER — Encounter: Payer: Self-pay | Admitting: Internal Medicine

## 2021-02-24 ENCOUNTER — Other Ambulatory Visit: Payer: Self-pay

## 2021-02-24 VITALS — BP 148/69 | HR 85 | Temp 97.8°F | Resp 18 | Ht 61.0 in | Wt 153.0 lb

## 2021-02-24 DIAGNOSIS — M810 Age-related osteoporosis without current pathological fracture: Secondary | ICD-10-CM | POA: Insufficient documentation

## 2021-02-24 DIAGNOSIS — Z7901 Long term (current) use of anticoagulants: Secondary | ICD-10-CM | POA: Diagnosis not present

## 2021-02-24 DIAGNOSIS — I4891 Unspecified atrial fibrillation: Secondary | ICD-10-CM | POA: Diagnosis not present

## 2021-02-24 DIAGNOSIS — R0609 Other forms of dyspnea: Secondary | ICD-10-CM | POA: Insufficient documentation

## 2021-02-24 DIAGNOSIS — D693 Immune thrombocytopenic purpura: Secondary | ICD-10-CM | POA: Insufficient documentation

## 2021-02-24 LAB — CBC WITH DIFFERENTIAL (CANCER CENTER ONLY)
Abs Immature Granulocytes: 0.08 10*3/uL — ABNORMAL HIGH (ref 0.00–0.07)
Basophils Absolute: 0.1 10*3/uL (ref 0.0–0.1)
Basophils Relative: 0 %
Eosinophils Absolute: 0.3 10*3/uL (ref 0.0–0.5)
Eosinophils Relative: 2 %
HCT: 38.8 % (ref 36.0–46.0)
Hemoglobin: 12.9 g/dL (ref 12.0–15.0)
Immature Granulocytes: 1 %
Lymphocytes Relative: 33 %
Lymphs Abs: 4.1 10*3/uL — ABNORMAL HIGH (ref 0.7–4.0)
MCH: 29.2 pg (ref 26.0–34.0)
MCHC: 33.2 g/dL (ref 30.0–36.0)
MCV: 87.8 fL (ref 80.0–100.0)
Monocytes Absolute: 1.6 10*3/uL — ABNORMAL HIGH (ref 0.1–1.0)
Monocytes Relative: 13 %
Neutro Abs: 6.4 10*3/uL (ref 1.7–7.7)
Neutrophils Relative %: 51 %
Platelet Count: 157 10*3/uL (ref 150–400)
RBC: 4.42 MIL/uL (ref 3.87–5.11)
RDW: 14.6 % (ref 11.5–15.5)
WBC Count: 12.6 10*3/uL — ABNORMAL HIGH (ref 4.0–10.5)
nRBC: 0 % (ref 0.0–0.2)

## 2021-02-24 NOTE — Progress Notes (Signed)
Hematology and Oncology Follow Up Visit  Tamara Robertson [id number] 09-Jul-1929 87 y.o. 02/24/2021 3:06 PM   Principle Diagnosis: 87 year old with ITP diagnosed in 2000.  He is currently in remission although she has had chronic relapsing disease previously.    Prior Therapy:  She is S/P splenectomy and subsequently treated with high doses of steroids. The patient have had a complete response to steroids back in the 60s and all the way have had a few relapses. Every time she has a relapse she gets restarted on high-dose of steroids and she achieved a complete response. The most recent of relapses before her move to [facility] she was hospitalized for a platelet count of 8000 around the year 2000.  Current therapy: Prednisone to 2.5 mg every other day.   Interim History: Tamara Robertson returns today for a follow-up visit.  Since her last visit, she was hospitalized in September 2022 for atrial fibrillation and rapid ventricular response.  During her hospitalizations her platelet count was close to normal range and fluctuated to as high as 170.  Since her discharge, she reports few complaints at this time predominantly in lower extremity swelling and planter fasciitis.  She denies any bleeding issues.  She denies hematochezia, melena.  She does report occasional dyspnea on exertion.  She is not able to drive but still lives independently.       Medications: Reviewed without changes. Current Outpatient Medications  Medication Sig Dispense Refill   acetaminophen (TYLENOL) 500 MG tablet Take 500 mg by mouth every 6 (six) hours as needed.     Biotin 2500 MCG CAPS Take 1 capsule by mouth daily.      calcium carbonate (TUMS - DOSED IN MG ELEMENTAL CALCIUM) 500 MG chewable tablet Chew 1 tablet by mouth daily as needed for indigestion or heartburn.     cycloSPORINE (RESTASIS) 0.05 % ophthalmic emulsion 1 drop 2 (two) times daily.     Elastic Bandages & Supports (MEDICAL COMPRESSION STOCKINGS) MISC  See admin instructions.     escitalopram (LEXAPRO) 5 MG tablet Take 1 tablet (5 mg total) by mouth daily. 90 tablet 3   famotidine (PEPCID) 20 MG tablet Take 1 tablet (20 mg total) by mouth 2 (two) times daily as needed for heartburn or indigestion. 180 tablet 3   Flaxseed MISC Take 5 mLs by mouth daily. Take 1 tsp daily     furosemide (LASIX) 20 MG tablet TAKE 0.5 TABLETS (10 MG TOTAL) BY MOUTH AS NEEDED. 45 tablet 2   Glucos-Chond-Hyal Ac-Ca Fructo (MOVE FREE JOINT HEALTH ADVANCE PO) Take by mouth.     losartan (COZAAR) 25 MG tablet Take 1 tablet (25 mg total) by mouth daily. 90 tablet 1   Magnesium Oxide 250 MG TABS Take 1 tablet by mouth.     metoprolol succinate (TOPROL-XL) 25 MG 24 hr tablet Take 1 tablet (25 mg total) by mouth at bedtime. 30 tablet 1   Multiple Vitamin (MULTIVITAMIN) tablet Take 1 tablet by mouth daily.       polyethylene glycol (MIRALAX / GLYCOLAX) packet Take 17 g by mouth daily as needed for moderate constipation.      predniSONE (DELTASONE) 2.5 MG tablet Take 1 tablet (2.5 mg total) by mouth every other day. 45 tablet 3   Probiotic Product (PROBIOTIC DAILY PO) Take 1 tablet by mouth daily with breakfast.     psyllium (METAMUCIL) 58.6 % packet Take 1 packet by mouth daily as needed.     PSYLLIUM PO Take 1  tablet by mouth every other day.     trolamine salicylate (ASPERCREME) 10 % cream Apply 1 application topically as needed for muscle pain.     No current facility-administered medications for this visit.    Allergies:  Allergies  Allergen Reactions   Aspirin Other (See Comments)    ITP   Sulfa Antibiotics Other (See Comments)    dizziness   Contrast Media [Iodinated Diagnostic Agents] Hives    CAT scan contrast only   Eliquis [Apixaban]     Rash    Fluoxetine     Did not feel well on it    Celebrex [Celecoxib] Other (See Comments)    Leg swelling   Celexa [Citalopram] Other (See Comments)    Felt out of it   Statins Other (See Comments)       Physical Exam:   Blood pressure (!) 148/69, pulse 85, temperature 97.8 F (36.6 C), temperature source Tympanic, resp. rate 18, height 5\' 1"  (1.549 m), weight 153 lb (69.4 kg), SpO2 95 %.    ECOG: 1    General appearance: Comfortable appearing without any discomfort Head: Normocephalic without any trauma Oropharynx: Mucous membranes are moist and pink without any thrush or ulcers. Eyes: Pupils are equal and round reactive to light. Lymph nodes: No cervical, supraclavicular, inguinal or axillary lymphadenopathy.   Heart:regular rate and rhythm.  S1 and S2 without leg edema. Lung: Clear without any rhonchi or wheezes.  No dullness to percussion. Abdomin: Soft, nontender, nondistended with good bowel sounds.  No hepatosplenomegaly. Musculoskeletal: No joint deformity or effusion.  Full range of motion noted. Neurological: No deficits noted on motor, sensory and deep tendon reflex exam. Skin: No petechial rash or dryness.  Appeared moist.         Lab Results: Lab Results  Component Value Date   WBC 12.6 (H) 01/13/2021   HGB 13.9 01/13/2021   HCT 41.7 01/13/2021   MCV 85 01/13/2021   PLT 160 01/13/2021     Chemistry      Component Value Date/Time   NA 136 01/06/2021 0331   NA 142 02/16/2016 1526   K 4.3 01/06/2021 0331   K 4.3 02/16/2016 1526   CL 105 01/06/2021 0331   CL 105 04/17/2012 1452   CO2 23 01/06/2021 0331   CO2 27 02/16/2016 1526   BUN 23 01/06/2021 0331   BUN 14.3 02/16/2016 1526   CREATININE 0.89 01/06/2021 0331   CREATININE 0.82 01/20/2020 1525   CREATININE 0.8 02/16/2016 1526      Component Value Date/Time   CALCIUM 8.2 (L) 01/06/2021 0331   CALCIUM 9.5 02/16/2016 1526   ALKPHOS 63 12/14/2020 1616   ALKPHOS 92 02/16/2016 1526   AST 20 12/14/2020 1616   AST 19 02/16/2016 1526   ALT 17 12/14/2020 1616   ALT 11 02/16/2016 1526   BILITOT 0.4 12/14/2020 1616   BILITOT 0.33 02/16/2016 1526      Impression and Plan:   87 year old  woman with:  1.  ITP that is currently in remission after initially diagnosed in year 2000.  She had a relapse and chronic course.  Her disease status was updated at this time and treatment choices were reviewed.  Laboratory data in the last few months reviewed and showed a platelet count that is consistently close to normal range or mildly low.  At this time, I see no need for intervention or pretreatment.  She does not require higher doses of steroids, IVIG or any additional intervention.  CBC from today reviewed and showed platelet count of 157 which remains consistent with her normal baseline.  Laying on   2. Osteoporosis: He is currently on calcium and vitamin D supplements.  3.  Atrial fibrillation: Resolved at this time continues to follow with cardiology.  She is off anticoagulation at this time due to rash from Eliquis.  4. Follow-up: She will return in 12 months for repeat follow-up.  30  minutes were dedicated to this visit. The time was spent on reviewing laboratory data, discussing treatment options, and answering questions regarding future plan.    Eli Hose, MD 10/21/20223:06 PM

## 2021-02-26 ENCOUNTER — Other Ambulatory Visit: Payer: Self-pay | Admitting: Cardiology

## 2021-02-26 DIAGNOSIS — I10 Essential (primary) hypertension: Secondary | ICD-10-CM

## 2021-02-27 ENCOUNTER — Other Ambulatory Visit: Payer: Self-pay

## 2021-02-27 ENCOUNTER — Telehealth: Payer: Self-pay

## 2021-02-27 DIAGNOSIS — I739 Peripheral vascular disease, unspecified: Secondary | ICD-10-CM

## 2021-02-27 DIAGNOSIS — M48062 Spinal stenosis, lumbar region with neurogenic claudication: Secondary | ICD-10-CM | POA: Diagnosis not present

## 2021-02-27 DIAGNOSIS — I83892 Varicose veins of left lower extremities with other complications: Secondary | ICD-10-CM

## 2021-02-27 NOTE — Telephone Encounter (Signed)
Patient calls today to report a couple month history of a swollen, red painful right foot. It's unclear what is related to her bursitis/vascular issues. Placed patient on the schedule for LE venous study and ABI. She would prefer to see a doctor and not a PA. Due to scheduling conflicts, patient will see [facility].

## 2021-03-01 DIAGNOSIS — M25511 Pain in right shoulder: Secondary | ICD-10-CM | POA: Diagnosis not present

## 2021-03-07 ENCOUNTER — Other Ambulatory Visit: Payer: Self-pay

## 2021-03-07 ENCOUNTER — Ambulatory Visit (INDEPENDENT_AMBULATORY_CARE_PROVIDER_SITE_OTHER)
Admission: RE | Admit: 2021-03-07 | Discharge: 2021-03-07 | Disposition: A | Discharge status: Home / Self Care | Payer: Medicare HMO | Source: Ambulatory Visit | Attending: Internal Medicine | Admitting: Internal Medicine

## 2021-03-07 DIAGNOSIS — R918 Other nonspecific abnormal finding of lung field: Secondary | ICD-10-CM

## 2021-03-07 DIAGNOSIS — I700 Atherosclerosis of aorta: Secondary | ICD-10-CM | POA: Diagnosis not present

## 2021-03-07 DIAGNOSIS — J479 Bronchiectasis, uncomplicated: Secondary | ICD-10-CM | POA: Diagnosis not present

## 2021-03-08 DIAGNOSIS — M79671 Pain in right foot: Secondary | ICD-10-CM | POA: Diagnosis not present

## 2021-03-08 NOTE — Addendum Note (Signed)
Addended by: Binnie Rail on: 03/08/2021 08:21 PM   Modules accepted: Orders

## 2021-03-09 ENCOUNTER — Other Ambulatory Visit: Payer: Self-pay | Admitting: Cardiology

## 2021-03-09 ENCOUNTER — Other Ambulatory Visit: Payer: Self-pay

## 2021-03-09 ENCOUNTER — Ambulatory Visit (HOSPITAL_COMMUNITY)
Admission: RE | Admit: 2021-03-09 | Discharge: 2021-03-09 | Disposition: A | Discharge status: Home / Self Care | Payer: Medicare HMO | Source: Ambulatory Visit | Attending: Vascular Surgery | Admitting: Vascular Surgery

## 2021-03-09 ENCOUNTER — Ambulatory Visit: Payer: Self-pay | Admitting: Obstetrics & Gynecology

## 2021-03-09 ENCOUNTER — Ambulatory Visit (INDEPENDENT_AMBULATORY_CARE_PROVIDER_SITE_OTHER)
Admission: RE | Admit: 2021-03-09 | Discharge: 2021-03-09 | Disposition: A | Discharge status: Home / Self Care | Payer: Medicare HMO | Source: Ambulatory Visit | Attending: Vascular Surgery | Admitting: Vascular Surgery

## 2021-03-09 DIAGNOSIS — I10 Essential (primary) hypertension: Secondary | ICD-10-CM

## 2021-03-09 DIAGNOSIS — I739 Peripheral vascular disease, unspecified: Secondary | ICD-10-CM | POA: Diagnosis not present

## 2021-03-09 DIAGNOSIS — I83891 Varicose veins of right lower extremities with other complications: Secondary | ICD-10-CM | POA: Diagnosis not present

## 2021-03-09 DIAGNOSIS — I83892 Varicose veins of left lower extremities with other complications: Secondary | ICD-10-CM | POA: Insufficient documentation

## 2021-03-14 ENCOUNTER — Encounter: Payer: Self-pay | Admitting: Vascular Surgery

## 2021-03-14 ENCOUNTER — Other Ambulatory Visit: Payer: Self-pay

## 2021-03-14 ENCOUNTER — Ambulatory Visit: Payer: Medicare HMO | Admitting: Vascular Surgery

## 2021-03-14 VITALS — BP 152/74 | HR 84 | Temp 98.2°F | Resp 20 | Ht 61.0 in | Wt 154.0 lb

## 2021-03-14 DIAGNOSIS — L03115 Cellulitis of right lower limb: Secondary | ICD-10-CM

## 2021-03-14 MED ORDER — CEPHALEXIN 500 MG PO CAPS: 500.0000 mg | ORAL_CAPSULE | Freq: Three times a day (TID) | ORAL | 0 refills | Status: DC

## 2021-03-14 NOTE — Progress Notes (Signed)
[facility] [place] PROGRESS NOTE  ASSESSMENT / PLAN: Tamara Robertson is a 87 y.o. female with mild RLE medial calf cellulitis - likely secondary chronic venous insufficiency and venous stasis dermatitis. Will Rx a short course of Keflex. Apply calamine lotion daily. Follow up with me in 1 month.  SUBJECTIVE: Returns to clinic for evaluation.  Prefers to see MD. off and medial ankle has been bothering her for about a month.  It is red, swollen, tender.  She has been wearing compression without much relief.  OBJECTIVE: BP (!) 152/74 (BP Location: Left Arm, Patient Position: Sitting, Cuff Size: Normal)   Pulse 84   Temp 98.2 F (36.8 C)   Resp 20   Ht 5\' 1"  (1.549 m)   Wt 154 lb (69.9 kg)   SpO2 98%   BMI 29.10 kg/m  Well-appearing in no acute distress Regular rate and rhythm Unlabored breathing Right lower extremity erythematous and tender to the touch over the medial malleolus.  1+ dorsalis pedis pulse bilaterally.  CBC Latest Ref Rng & Units 02/24/2021 01/13/2021 01/06/2021  WBC 4.0 - 10.5 K/uL 12.6(H) 12.6(H) 15.6(H)  Hemoglobin 12.0 - 15.0 g/dL 12.9 13.9 12.3  Hematocrit 36.0 - 46.0 % 38.8 41.7 36.8  Platelets 150 - 400 K/uL 157 160 PLATELET CLUMPS NOTED ON SMEAR, UNABLE TO ESTIMATE     CMP Latest Ref Rng & Units 01/06/2021 01/05/2021 12/14/2020  Glucose 70 - 99 mg/dL 114(H) 147(H) 76  BUN 8 - 23 mg/dL 23 20 22   Creatinine 0.44 - 1.00 mg/dL 0.89 0.81 0.93  Sodium 135 - 145 mmol/L 136 141 137  Potassium 3.5 - 5.1 mmol/L 4.3 4.1 4.0  Chloride 98 - 111 mmol/L 105 109 100  CO2 22 - 32 mmol/L 23 24 32  Calcium 8.9 - 10.3 mg/dL 8.2(L) 8.6(L) 9.0  Total Protein 6.0 - 8.3 g/dL - - 7.1  Total Bilirubin 0.2 - 1.2 mg/dL - - 0.4  Alkaline Phos 39 - 117 U/L - - 63  AST 0 - 37 U/L - - 20  ALT 0 - 35 U/L - - 17   +-------+-----------+-----------+------------+------------+  ABI/TBIToday's ABIToday's TBIPrevious ABIPrevious TBI   +-------+-----------+-----------+------------+------------+  Right  0.73       0.25       0.79        0.65          +-------+-----------+-----------+------------+------------+  Left   0.76       0.40       0.91        0.64          +-------+-----------+-----------+------------+------------+   Right lower extremity venous reflux study: - No evidence of deep vein thrombosis seen in the right lower extremity, from the common femoral through the calf veins. - No evidence of superficial venous thrombosis in the right lower extremity. - Venous reflux is noted in the right greater saphenous vein in the calf. - Venous reflux is noted in the right popliteal vein. - Venous reflux is noted in the right short saphenous vein. - Venous reflux is noted in the right anterior accessory saphenous vein.    Tamara Robertson. [place] Breed, MD [facility] Phone Number: [phone] 03/14/2021 3:36 PM

## 2021-03-16 DIAGNOSIS — M25511 Pain in right shoulder: Secondary | ICD-10-CM | POA: Diagnosis not present

## 2021-03-16 NOTE — Addendum Note (Signed)
Addended by: Binnie Rail on: 03/16/2021 04:27 PM   Modules accepted: Orders

## 2021-03-20 ENCOUNTER — Ambulatory Visit: Payer: Medicare HMO | Admitting: Obstetrics & Gynecology

## 2021-03-21 ENCOUNTER — Other Ambulatory Visit: Payer: Self-pay

## 2021-03-21 ENCOUNTER — Encounter: Payer: Self-pay | Admitting: Physical Therapy

## 2021-03-21 ENCOUNTER — Ambulatory Visit: Payer: Medicare HMO | Attending: Orthopaedic Surgery | Admitting: Physical Therapy

## 2021-03-21 DIAGNOSIS — R262 Difficulty in walking, not elsewhere classified: Secondary | ICD-10-CM

## 2021-03-21 DIAGNOSIS — M25571 Pain in right ankle and joints of right foot: Secondary | ICD-10-CM | POA: Diagnosis present

## 2021-03-21 DIAGNOSIS — M6281 Muscle weakness (generalized): Secondary | ICD-10-CM

## 2021-03-21 DIAGNOSIS — R600 Localized edema: Secondary | ICD-10-CM | POA: Diagnosis present

## 2021-03-21 NOTE — Therapy (Addendum)
[facility] [address] [place], [place], [address] Phone: [phone]   Fax:  [phone]  Physical Therapy Evaluation  Patient Details  Name: Tamara Robertson MRN: [id number] Date of Birth: [birth year] Referring Provider (PT): Dr. Armond Hang   Encounter Date: 03/21/2021   PT End of Session - 03/21/21 1753     Visit Number 1    Date for PT Re-Evaluation 05/16/21    Authorization Type [employer] Medicare 10th visit progress note    PT Start Time 1400    PT Stop Time 1445    PT Time Calculation (min) 45 min    Activity Tolerance Patient limited by pain             Past Medical History:  Diagnosis Date   Anxiety    CAD (coronary artery disease)    RCA 40% stenosis cath 01/2011   Diverticular stricture [facility]) 2006   [place], HX OF    Diverticulosis    DYSLIPIDEMIA    Elevated LFTs    GERD    Glaucoma    Hepatic steatosis    HOH (hard of hearing)    Immune thrombocytopenic purpura ([place])    chronic - baseline 80-100K, on pred   Irritable bowel syndrome    Left ovarian cyst dx 01/2013 CT   working with gyn, ?malignant - elevated tumor marker OVA1   OSTEOARTHRITIS, KNEE, RIGHT    OSTEOPENIA    OVERACTIVE BLADDER    Prolapse of female bladder, acquired 11/2017   URINARY INCONTINENCE     Past Surgical History:  Procedure Laterality Date   [place], BILATERAL  10/2010   CHOLECYSTECTOMY N/A 09/16/2014   Procedure: LAPAROSCOPIC CHOLECYSTECTOMY ;  Surgeon: Rolm Bookbinder, MD;  Location: [facility];  Service: General;  Laterality: N/A;   KNEE ARTHROSCOPY Right    L pop PTA  10/2009   stent   LAPAROSCOPIC SIGMOID COLECTOMY  10/2005   [place]    There were no vitals filed for this visit.    Subjective Assessment - 03/21/21 1404      Subjective Abbout 2 year foot pain.  Pain stopped.  Had steroid shot in spine then foot pain restarted.  Swelling in lower leg.  Had a shot.  I can't put a shoe on.  Medial ankle/lower leg pain and Achilles region pain right foot.  With walking I have to stop after 5 minutes and let it rest 3-4 minutes.  I wonder if it's a nerve.    Pertinent History rotator cuff tear massive has PT order;  A-fib;  spot on lung but not CA;  vascular problems with veins removed    Limitations Walking;House hold activities    How long can you sit comfortably? some ankle pain with sitting    How long can you walk comfortably? 5 minutes    Diagnostic tests 2 spurs back of heel    Patient Stated Goals get a shoe on;  I've gained 10 pounds;  I can't do anything, I want to walk    Currently in Pain? Yes    Pain Score 8     Pain Location Leg    Pain Orientation Right    Aggravating Factors  walking, standing for cooking    Pain Relieving Factors ice the heel;  [facility] [place] [place] [place], [place], [address] Phone: [phone]   Fax:  [phone]  Physical Therapy Evaluation  Patient Details  Name: Tamara Robertson MRN: [id number] Date of Birth: [birth year] Referring Provider (PT): Dr. Armond Hang   Encounter Date: 03/21/2021   PT End of Session - 03/21/21 1753     Visit Number 1    Date for PT Re-Evaluation 05/16/21    Authorization Type A[employer]na Medicare 10th visit progress note    PT Start Time 1400    PT Stop Time 1445    PT Time Calculation (min) 45 min    Activity Tolerance Patient limited by pain             Past Medical History:  Diagnosis Date   Anxiety    CAD (coronary artery disease)    RCA 40% stenosis cath 01/2011   Diverticular stricture [facility]) 2006   [place], HX OF    Diverticulosis    DYSLIPIDEMIA    Elevated LFTs    GERD    Glaucoma    Hepatic steatosis    HOH (hard of hearing)    Immune thrombocytopenic purpura ([place])    chronic - baseline 80-100K, on pred   Irritable bowel syndrome    Left ovarian cyst dx 01/2013 CT   working with gyn, ?malignant - elevated tumor marker OVA1   OSTEOARTHRITIS, KNEE, RIGHT    OSTEOPENIA    OVERACTIVE BLADDER    Prolapse of female bladder, acquired 11/2017   URINARY INCONTINENCE     Past Surgical History:  Procedure Laterality Date   [place], BILATERAL  10/2010   CHOLECYSTECTOMY N/A 09/16/2014   Procedure: LAPAROSCOPIC CHOLECYSTECTOMY ;  Surgeon: Rolm Bookbinder, MD;  Location: [facility];  Service: General;  Laterality: N/A;   KNEE ARTHROSCOPY Right    L pop PTA  10/2009   stent   LAPAROSCOPIC SIGMOID COLECTOMY  10/2005   [place]    There were no vitals filed for this visit.    Subjective Assessment - 03/21/21 1404      Subjective Abbout 2 year foot pain.  Pain stopped.  Had steroid shot in spine then foot pain restarted.  Swelling in lower leg.  Had a shot.  I can't put a shoe on.  Medial ankle/lower leg pain and Achilles region pain right foot.  With walking I have to stop after 5 minutes and let it rest 3-4 minutes.  I wonder if it's a nerve.    Pertinent History rotator cuff tear massive has PT order;  A-fib;  spot on lung but not CA;  vascular problems with veins removed    Limitations Walking;House hold activities    How long can you sit comfortably? some ankle pain with sitting    How long can you walk comfortably? 5 minutes    Diagnostic tests 2 spurs back of heel    Patient Stated Goals get a shoe on;  I've gained 10 pounds;  I can't do anything, I want to walk    Currently in Pain? Yes    Pain Score 8     Pain Location Leg    Pain Orientation Right    Aggravating Factors  walking, standing for cooking    Pain Relieving Factors ice the heel;  [facility] [place] [place], [place], [address] Phone: [phone]   Fax:  [phone]  Physical Therapy Evaluation  Patient Details  Name: Tamara Robertson MRN: [id number] Date of Birth: [birth year] Referring Provider (PT): Dr. Armond Hang   Encounter Date: 03/21/2021   PT End of Session - 03/21/21 1753     Visit Number 1    Date for PT Re-Evaluation 05/16/21    Authorization Type A[employer]na Medicare 10th visit progress note    PT Start Time 1400    PT Stop Time 1445    PT Time Calculation (min) 45 min    Activity Tolerance Patient limited by pain             Past Medical History:  Diagnosis Date   Anxiety    CAD (coronary artery disease)    RCA 40% stenosis cath 01/2011   Diverticular stricture [facility]) 2006   [place], HX OF    Diverticulosis    DYSLIPIDEMIA    Elevated LFTs    GERD    Glaucoma    Hepatic steatosis    HOH (hard of hearing)    Immune thrombocytopenic purpura ([place])    chronic - baseline 80-100K, on pred   Irritable bowel syndrome    Left ovarian cyst dx 01/2013 CT   working with gyn, ?malignant - elevated tumor marker OVA1   OSTEOARTHRITIS, KNEE, RIGHT    OSTEOPENIA    OVERACTIVE BLADDER    Prolapse of female bladder, acquired 11/2017   URINARY INCONTINENCE     Past Surgical History:  Procedure Laterality Date   [place], BILATERAL  10/2010   CHOLECYSTECTOMY N/A 09/16/2014   Procedure: LAPAROSCOPIC CHOLECYSTECTOMY ;  Surgeon: Rolm Bookbinder, MD;  Location: [facility];  Service: General;  Laterality: N/A;   KNEE ARTHROSCOPY Right    L pop PTA  10/2009   stent   LAPAROSCOPIC SIGMOID COLECTOMY  10/2005   [place]    There were no vitals filed for this visit.    Subjective Assessment - 03/21/21 1404      Subjective Abbout 2 year foot pain.  Pain stopped.  Had steroid shot in spine then foot pain restarted.  Swelling in lower leg.  Had a shot.  I can't put a shoe on.  Medial ankle/lower leg pain and Achilles region pain right foot.  With walking I have to stop after 5 minutes and let it rest 3-4 minutes.  I wonder if it's a nerve.    Pertinent History rotator cuff tear massive has PT order;  A-fib;  spot on lung but not CA;  vascular problems with veins removed    Limitations Walking;House hold activities    How long can you sit comfortably? some ankle pain with sitting    How long can you walk comfortably? 5 minutes    Diagnostic tests 2 spurs back of heel    Patient Stated Goals get a shoe on;  I've gained 10 pounds;  I can't do anything, I want to walk    Currently in Pain? Yes    Pain Score 8     Pain Location Leg    Pain Orientation Right    Aggravating Factors  walking, standing for cooking    Pain Relieving Factors ice the heel;  Stimulation;Cryotherapy;Ultrasound;Vasopneumatic Device;Taping;Manual techniques;Therapeutic exercise;Therapeutic activities;Neuromuscular re-education;Patient/family education    PT Next Visit Plan aquatic PT; modalities as needed; KT; unloading strengthening; quad strengthening to aid sit to stand    PT Home Exercise Plan start [place] and Agree with Plan of Care Patient            By signing I understand that I am ordering/authorizing the use of Iontophoresis using 4 mg/mL of dexamethasone as a component of this plan of care.    Patient will benefit from skilled therapeutic intervention in order to improve the following deficits and impairments:  Difficulty walking, Pain, Decreased strength, Increased edema, Decreased activity tolerance, Impaired perceived functional ability  Visit Diagnosis: Pain in right ankle and joints of right foot - Plan: PT plan of care cert/re-cert  Difficulty in walking, not elsewhere classified - Plan: PT plan of care cert/re-cert  Localized edema - Plan: PT plan of care cert/re-cert  Muscle weakness (generalized) - Plan: PT plan of care cert/re-cert     Problem List Patient Active Problem List   Diagnosis Date Noted   Right Achilles bursitis 01/20/2021   Paroxysmal A-fib ([place]) 01/05/2021   Aortic atherosclerosis ([place]) 12/14/2020    Spinal stenosis of lumbar region with neurogenic claudication 12/14/2020   Melanocytic nevi of trunk 10/05/2020   Peripheral venous insufficiency 10/05/2020   Rosacea 10/05/2020   Inflamed seborrheic keratosis 10/05/2020   Temporal arteritis ([place]) 10/05/2020   Viral warts 10/05/2020   Ear pain, left 06/23/2020   Posterior neck pain 08/03/2019   Dizziness 08/03/2019   Diarrhea 03/13/2019   Nonrheumatic aortic valve stenosis 02/26/2019   Sensorineural hearing loss (SNHL), bilateral 01/30/2019   Chronic ITP (idiopathic thrombocytopenia) ([place]) 12/01/2018   Lightheadedness 11/19/2018   Epistaxis 11/19/2018   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   Rash 03/25/2018   Chronic left-sided headaches 01/28/2018   SBO (small bowel obstruction) ([place]) 09/20/2017   Aortic stenosis, moderate 08/22/2017   CAD (coronary artery disease) 08/21/2017   Community acquired pneumonia 06/25/2017   Current chronic use of systemic steroids 06/25/2017   Varicose veins of left lower extremity with complications 14/78/2956   Hypertension 07/31/2016   Leg edema 07/31/2016   Prediabetes 06/05/2016   Osteopenia 12/01/2015   Anxiety and depression 07/19/2015   Degenerative cervical disc 12/28/2014   Left ovarian cyst    Peripheral neuropathy 06/03/2012   PVD (peripheral vascular disease) ([place]) 11/23/2010   Bursitis of hip 10/26/2010   Hyperlipidemia 06/27/2010   Unspecified glaucoma 06/27/2010   GERD 06/27/2010   Irritable bowel syndrome 06/27/2010   OVERACTIVE BLADDER 06/27/2010   OSTEOARTHRITIS, KNEE, RIGHT 06/27/2010   URINARY INCONTINENCE 06/27/2010   Ruben Im, PT 03/21/21 6:14 PM Phone: [phone] Fax: [phone]  Alvera Singh, PT 03/21/2021, 6:14 PM  [facility] @ [address] [place], [place], [address] Phone: [phone]   Fax:  [phone]  Name: Lachae Hohler MRN: [id number] Date of Birth: 02-Nov-1929

## 2021-03-21 NOTE — Patient Instructions (Signed)
     [facility] Welcome to [facility]! Here you will find all the information you will need regarding your pool therapy. If you have further questions at any time, please call our office at [phone]. After completing your initial evaluation in the [facility] clinic, you may be eligible to complete a portion of your therapy in the pool. A typical week of therapy will consist of 1-2 typical physical therapy visits at our [facility] location and an additional session of therapy in the pool located at the [facility] at [address]. [address], [place] [address]. The phone number at the pool site is [phone]. Please call this number if you are running late or need to cancel your appointment.  Aquatic therapy will be offered on Wednesday mornings and Friday afternoons. Each session will last approximately 45 minutes. All scheduling and payments for aquatic therapy sessions, including cancelations, will be done through our [facility] location.  To be eligible for aquatic therapy, these criteria must be met: You must be able to independently change in the locker room and get to the pool deck. A caregiver can come with you to help if needed. There are benches for a caregiver to sit on next to the pool. No one with an open wound is permitted in the pool.  Handicap parking is available in the front and there is a drop off option for even closer accessibility. Please arrive 15 minutes prior to your appointment to prepare for your pool session. You must sign in at the front desk upon your arrival. Please be sure to attend to any toileting needs prior to entering the pool. Locker rooms for changing are available.  There is direct access to the pool deck from the locker room. You can lock your belongings in a locker or bring them with you poolside. Your therapist will greet you on the pool deck. There may be other swimmers in the pool at the  same time but your session is one-on-one with the therapist.   

## 2021-03-24 ENCOUNTER — Telehealth: Payer: Self-pay

## 2021-03-24 NOTE — Telephone Encounter (Signed)
Patient is having some increased swelling and pain on her left leg. Says she is using calamine lotion and that is helping with the itching. She has finished the course of Keflex, but that did not help her symptoms at all. She denies any fever or chills and says she is elevating her leg as much as she can and wearing compression socks. Moved patient appt up one week.

## 2021-03-27 ENCOUNTER — Ambulatory Visit (INDEPENDENT_AMBULATORY_CARE_PROVIDER_SITE_OTHER): Payer: Medicare HMO | Admitting: Obstetrics & Gynecology

## 2021-03-27 ENCOUNTER — Other Ambulatory Visit: Payer: Self-pay

## 2021-03-27 ENCOUNTER — Encounter: Payer: Self-pay | Admitting: Obstetrics & Gynecology

## 2021-03-27 VITALS — BP 134/64 | HR 80 | Resp 16 | Ht 61.25 in | Wt 153.0 lb

## 2021-03-27 DIAGNOSIS — Z780 Asymptomatic menopausal state: Secondary | ICD-10-CM

## 2021-03-27 DIAGNOSIS — Z9189 Other specified personal risk factors, not elsewhere classified: Secondary | ICD-10-CM | POA: Diagnosis not present

## 2021-03-27 DIAGNOSIS — N8110 Cystocele, unspecified: Secondary | ICD-10-CM

## 2021-03-27 DIAGNOSIS — M8589 Other specified disorders of bone density and structure, multiple sites: Secondary | ICD-10-CM

## 2021-03-27 DIAGNOSIS — N83202 Unspecified ovarian cyst, left side: Secondary | ICD-10-CM

## 2021-03-27 DIAGNOSIS — Z01419 Encounter for gynecological examination (general) (routine) without abnormal findings: Secondary | ICD-10-CM

## 2021-03-27 NOTE — Progress Notes (Signed)
Tamara Robertson 04/30/1930 [id number]   History:    87 y.o. G3P3L2   RP:  Established patient presenting for annual gyn exam    HPI: Longtime postmenopausal, well on no hormone replacement therapy.  Status post total hysterectomy.  No pelvic pain. Pap 2000.  Followed for a left ovarian cyst which showed stability and benign appearance at 4.2 cm on MRI 03/05/2020.  Abstinent.  Urine and bowel movements normal.  Breasts normal.  Screening Mammo Negative 01/2021. Body mass index 28.67.  Physically active every day.  Health labs with family physician.  BD Osteopenia 04/2019. [place] 2006.  Past medical history,surgical history, family history and social history were all reviewed and documented in the EPIC chart.  Gynecologic History No LMP recorded. Patient has had a hysterectomy.  Obstetric History OB History  Gravida Para Term Preterm AB Living  3 3       2   SAB IAB Ectopic Multiple Live Births               # Outcome Date GA Lbr Len/2nd Weight Sex Delivery Anes PTL Lv  3 Para           2 Para           1 Para             Obstetric Comments  1 child passed away at 20     ROS: A ROS was performed and pertinent positives and negatives are included in the history.  GENERAL: No fevers or chills. HEENT: No change in vision, no earache, sore throat or sinus congestion. NECK: No pain or stiffness. CARDIOVASCULAR: No chest pain or pressure. No palpitations. PULMONARY: No shortness of breath, cough or wheeze. GASTROINTESTINAL: No abdominal pain, nausea, vomiting or diarrhea, melena or bright red blood per rectum. GENITOURINARY: No urinary frequency, urgency, hesitancy or dysuria. MUSCULOSKELETAL: No joint or muscle pain, no back pain, no recent trauma. DERMATOLOGIC: No rash, no itching, no lesions. ENDOCRINE: No polyuria, polydipsia, no heat or cold intolerance. No recent change in weight. HEMATOLOGICAL: No anemia or easy bruising or bleeding. NEUROLOGIC: No headache, seizures, numbness,  tingling or weakness. PSYCHIATRIC: No depression, no loss of interest in normal activity or change in sleep pattern.     Exam:   BP 134/64   Pulse 80   Resp 16   Ht 5' 1.25" (1.556 m)   Wt 153 lb (69.4 kg)   BMI 28.67 kg/m   Body mass index is 28.67 kg/m.  General appearance : Well developed well nourished female. No acute distress HEENT: Eyes: no retinal hemorrhage or exudates,  Neck supple, trachea midline, no carotid bruits, no thyroidmegaly Lungs: Clear to auscultation, no rhonchi or wheezes, or rib retractions  Heart: Regular rate and rhythm, no murmurs or gallops Breast:Examined in sitting and supine position were symmetrical in appearance, no palpable masses or tenderness,  no skin retraction, no nipple inversion, no nipple discharge, no skin discoloration, no axillary or supraclavicular lymphadenopathy Abdomen: no palpable masses or tenderness, no rebound or guarding Extremities: no edema or skin discoloration or tenderness  Pelvic: Vulva: Normal             Vagina: No gross lesions or discharge  Cervix/Uterus absent  Adnexa  Without masses or tenderness  Anus: Normal   Assessment/Plan:  87 y.o. female for annual exam   1. Well female exam with routine gynecological exam Longtime postmenopausal, well on no hormone replacement therapy.  Status post total hysterectomy.  No  pelvic pain. Pap 2000.  Followed for a left ovarian cyst which showed stability and benign appearance at 4.2 cm on MRI 03/05/2020.  Abstinent.  Urine and bowel movements normal.  Breasts normal.  Screening Mammo Negative 01/2021. Body mass index 28.67.  Physically active every day.  Health labs with family physician.  BD Osteopenia 04/2019. [place] 2006.  2. At risk for complication after fracture  3. Postmenopausal Longtime postmenopausal, well on no hormone replacement therapy.  Status post total hysterectomy.  No pelvic pain.  4. Osteopenia of multiple sites BD Osteopenia 04/2019. Decision to stop  screening.  Continue with Vit D, Ca++ total 1.2 g/d.  5. Baden-Walker grade 2 cystocele Stable.  Tendency for Urgency in the morning.  6. Left ovarian cyst Last MRI stable in 2021.  Will image again if Sxic.  Other orders - lidocaine (LMX) 4 % cream; Apply 1 application topically as needed.   [name] MD, 1:50 PM 03/27/2021

## 2021-03-27 NOTE — Addendum Note (Signed)
Addended by: Alvera Singh on: 03/27/2021 02:50 PM   Modules accepted: Orders

## 2021-03-28 ENCOUNTER — Ambulatory Visit: Payer: Medicare HMO | Admitting: Physical Therapy

## 2021-03-28 DIAGNOSIS — R262 Difficulty in walking, not elsewhere classified: Secondary | ICD-10-CM | POA: Diagnosis not present

## 2021-03-28 DIAGNOSIS — M25571 Pain in right ankle and joints of right foot: Secondary | ICD-10-CM | POA: Diagnosis not present

## 2021-03-28 DIAGNOSIS — M6281 Muscle weakness (generalized): Secondary | ICD-10-CM

## 2021-03-28 DIAGNOSIS — R600 Localized edema: Secondary | ICD-10-CM | POA: Diagnosis not present

## 2021-03-28 NOTE — Patient Instructions (Addendum)
Access Code: [email or web address] URL: [email or web address] Date: 03/28/2021 Prepared by: Ruben Im  Exercises Seated Calf Stretch with Strap - 1 x daily - 7 x weekly - 1 sets - 3-5 reps - 10 hold Seated Arch Lifts - 1 x daily - 7 x weekly - 1 sets - 10 reps Seated Toe Towel Scrunches - 1 x daily - 7 x weekly - 1 sets - 10 reps Ankle Inversion Eversion Towel Slide - 1 x daily - 7 x weekly - 1 sets - 10 reps Seated Heel Raise - 1 x daily - 7 x weekly - 1 sets - 10 reps  IONTOPHORESIS PATIENT PRECAUTIONS & CONTRAINDICATIONS:  Redness under one or both electrodes can occur.  This characterized by a uniform redness that usually disappears within 12 hours of treatment. Small pinhead size blisters may result in response to the drug.  Contact your physician if the problem persists more than 24 hours. On rare occasions, iontophoresis therapy can result in temporary skin reactions such as rash, inflammation, irritation or burns.  The skin reactions may be the result of individual sensitivity to the ionic solution used, the condition of the skin at the start of treatment, reaction to the materials in the electrodes, allergies or sensitivity to dexamethasone, or a poor connection between the patch and your skin.  Discontinue using iontophoresis if you have any of these reactions and report to your therapist. Remove the Patch or electrodes if you have any undue sensation of pain or burning during the treatment and report discomfort to your therapist. Tell your Therapist if you have had known adverse reactions to the application of electrical current. If using the Patch, the LED light will turn off when treatment is complete and the patch can be removed.  Approximate treatment time is 1-3 hours.  Remove the patch when light goes off or after 6 hours. The Patch can be worn during normal activity, however excessive motion where the electrodes have been placed can cause poor contact between the skin  and the electrode or uneven electrical current resulting in greater risk of skin irritation. Keep out of the reach of children.   DO NOT use if you have a cardiac pacemaker or any other electrically sensitive implanted device. DO NOT use if you have a known sensitivity to dexamethasone. DO NOT use during Magnetic Resonance Imaging (MRI). DO NOT use over broken or compromised skin (e.g. sunburn, cuts, or acne) due to the increased risk of skin reaction. DO NOT SHAVE over the area to be treated:  To establish good contact between the Patch and the skin, excessive hair may be clipped. DO NOT place the Patch or electrodes on or over your eyes, directly over your heart, or brain. DO NOT reuse the Patch or electrodes as this may cause burns to occur.

## 2021-03-28 NOTE — Therapy (Signed)
to improve the following deficits and impairments:  Difficulty walking, Pain, Decreased strength, Increased edema, Decreased activity tolerance, Impaired perceived functional ability  Visit Diagnosis: Pain in right ankle and joints of right  foot  Difficulty in walking, not elsewhere classified  Muscle weakness (generalized)     Problem List Patient Active Problem List   Diagnosis Date Noted   Right Achilles bursitis 01/20/2021   Paroxysmal A-fib ([place]) 01/05/2021   Aortic atherosclerosis ([place]) 12/14/2020   Spinal stenosis of lumbar region with neurogenic claudication 12/14/2020   Melanocytic nevi of trunk 10/05/2020   Peripheral venous insufficiency 10/05/2020   Rosacea 10/05/2020   Inflamed seborrheic keratosis 10/05/2020   Temporal arteritis ([place]) 10/05/2020   Viral warts 10/05/2020   Ear pain, left 06/23/2020   Posterior neck pain 08/03/2019   Dizziness 08/03/2019   Diarrhea 03/13/2019   Nonrheumatic aortic valve stenosis 02/26/2019   Sensorineural hearing loss (SNHL), bilateral 01/30/2019   Chronic ITP (idiopathic thrombocytopenia) ([place]) 12/01/2018   Lightheadedness 11/19/2018   Epistaxis 11/19/2018   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   Rash 03/25/2018   Chronic left-sided headaches 01/28/2018   SBO (small bowel obstruction) ([place]) 09/20/2017   Aortic stenosis, moderate 08/22/2017   CAD (coronary artery disease) 08/21/2017   Community acquired pneumonia 06/25/2017   Current chronic use of systemic steroids 06/25/2017   Varicose veins of left lower extremity with complications 28/41/3244   Hypertension 07/31/2016   Leg edema 07/31/2016   Prediabetes 06/05/2016   Osteopenia 12/01/2015   Anxiety and depression 07/19/2015   Degenerative cervical disc 12/28/2014   Left ovarian cyst    Peripheral neuropathy 06/03/2012   PVD (peripheral vascular disease) ([place]) 11/23/2010   Bursitis of hip 10/26/2010   Hyperlipidemia 06/27/2010   Unspecified glaucoma 06/27/2010   GERD 06/27/2010   Irritable bowel syndrome 06/27/2010   OVERACTIVE BLADDER 06/27/2010   OSTEOARTHRITIS, KNEE, RIGHT 06/27/2010   URINARY INCONTINENCE 06/27/2010   Ruben Im, PT 03/28/21 5:17 PM Phone: [phone] Fax: [phone]   Alvera Singh, PT 03/28/2021, 5:17 PM  [facility] @ [address] [place], [place], [address] Phone: [phone]   Fax:  [phone]  Name: Tamara Robertson MRN: [id number] Date of Birth: 01-11-1930  to improve the following deficits and impairments:  Difficulty walking, Pain, Decreased strength, Increased edema, Decreased activity tolerance, Impaired perceived functional ability  Visit Diagnosis: Pain in right ankle and joints of right  foot  Difficulty in walking, not elsewhere classified  Muscle weakness (generalized)     Problem List Patient Active Problem List   Diagnosis Date Noted   Right Achilles bursitis 01/20/2021   Paroxysmal A-fib ([place]) 01/05/2021   Aortic atherosclerosis ([place]) 12/14/2020   Spinal stenosis of lumbar region with neurogenic claudication 12/14/2020   Melanocytic nevi of trunk 10/05/2020   Peripheral venous insufficiency 10/05/2020   Rosacea 10/05/2020   Inflamed seborrheic keratosis 10/05/2020   Temporal arteritis ([place]) 10/05/2020   Viral warts 10/05/2020   Ear pain, left 06/23/2020   Posterior neck pain 08/03/2019   Dizziness 08/03/2019   Diarrhea 03/13/2019   Nonrheumatic aortic valve stenosis 02/26/2019   Sensorineural hearing loss (SNHL), bilateral 01/30/2019   Chronic ITP (idiopathic thrombocytopenia) ([place]) 12/01/2018   Lightheadedness 11/19/2018   Epistaxis 11/19/2018   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   Rash 03/25/2018   Chronic left-sided headaches 01/28/2018   SBO (small bowel obstruction) ([place]) 09/20/2017   Aortic stenosis, moderate 08/22/2017   CAD (coronary artery disease) 08/21/2017   Community acquired pneumonia 06/25/2017   Current chronic use of systemic steroids 06/25/2017   Varicose veins of left lower extremity with complications 28/41/3244   Hypertension 07/31/2016   Leg edema 07/31/2016   Prediabetes 06/05/2016   Osteopenia 12/01/2015   Anxiety and depression 07/19/2015   Degenerative cervical disc 12/28/2014   Left ovarian cyst    Peripheral neuropathy 06/03/2012   PVD (peripheral vascular disease) ([place]) 11/23/2010   Bursitis of hip 10/26/2010   Hyperlipidemia 06/27/2010   Unspecified glaucoma 06/27/2010   GERD 06/27/2010   Irritable bowel syndrome 06/27/2010   OVERACTIVE BLADDER 06/27/2010   OSTEOARTHRITIS, KNEE, RIGHT 06/27/2010   URINARY INCONTINENCE 06/27/2010   Ruben Im, PT 03/28/21 5:17 PM Phone: [phone] Fax: [phone]   Alvera Singh, PT 03/28/2021, 5:17 PM  [facility] @ [address] [place], [place], [address] Phone: [phone]   Fax:  [phone]  Name: Tamara Robertson MRN: [id number] Date of Birth: 01-11-1930  [facility] @ [address] [place], [place], [address] Phone: [phone]   Fax:  [phone]  Physical Therapy Treatment  Patient Details  Name: Tamara Robertson MRN: [id number] Date of Birth: Oct 23, 1929 Referring Provider (PT): Dr. Armond Hang   Encounter Date: 03/28/2021   PT End of Session - 03/28/21 1706     Visit Number 2    Date for PT Re-Evaluation 05/16/21    Authorization Type [employer] 10th visit progress note    PT Start Time 1437    PT Stop Time 1520    PT Time Calculation (min) 43 min    Activity Tolerance Patient tolerated treatment well             Past Medical History:  Diagnosis Date   Achilles tendonitis    Anxiety    CAD (coronary artery disease)    RCA 40% stenosis cath 01/2011   Diverticular stricture [facility]) 2006   [place], HX OF    Diverticulosis    DYSLIPIDEMIA    Elevated LFTs    GERD    Glaucoma    Hepatic steatosis    HOH (hard of hearing)    Immune thrombocytopenic purpura ([place])    chronic - baseline 80-100K, on pred   Irritable bowel syndrome    Left ovarian cyst dx 01/2013 CT   working with gyn, ?malignant - elevated tumor marker OVA1   OSTEOARTHRITIS, KNEE, RIGHT    OSTEOPENIA    OVERACTIVE BLADDER    Plantar fasciitis    Prolapse of female bladder, acquired 11/2017   Torn rotator cuff    right shoulder   URINARY INCONTINENCE     Past Surgical History:  Procedure Laterality Date   [place], BILATERAL  10/2010   CHOLECYSTECTOMY N/A 09/16/2014   Procedure: LAPAROSCOPIC CHOLECYSTECTOMY ;  Surgeon: Rolm Bookbinder, MD;  Location: [facility];  Service: General;  Laterality: N/A;   KNEE ARTHROSCOPY Right    L pop PTA  10/2009   stent   LAPAROSCOPIC SIGMOID COLECTOMY  10/2005   [place]     There were no vitals filed for this visit.   Subjective Assessment - 03/28/21 1436     Subjective That thing kept me up all night last night.  Now the other one is swelling too.  I think the spinal injection caused this.  It goes straight down to my toe.  I'm going to vascular next week.  I haven't been to the [facility] yet b/c of transportation.    Pertinent History rotator cuff tear massive has PT order;  A-fib;  spot on lung but not CA;  vascular problems with veins removed    Currently in Pain? Yes    Pain Score 6     Pain Location Leg    Pain Orientation Right                               [facility] Adult PT Treatment/Exercise - 03/28/21 0001       Iontophoresis   Type of Iontophoresis Dexamethasone    Location back of the heel    Dose 4 mg/ml    Time 4-6 hour patch      Ankle Exercises: Stretches

## 2021-04-02 NOTE — Progress Notes (Signed)
Cardiology Office Note   Date:  04/04/2021   ID:  Reilly, Mangieri [birth year], MRN [id number]   PCP:  Pincus Sanes, MD  Cardiologist:   Rollene Rotunda, MD   Chief Complaint  Patient presents with   Edema       History of Present Illness: Tamara Robertson is a 87 y.o. female who presents for evaluation of PT.  She has had non obstructive CAD.    She had remote PTA in [place], and chronic LE edema.     She was in the hospital recently with new onset atrial fibrillation with rapid ventricular response.  He had demand ischemia with elevated troponins.  She was treated with IV diltiazem and also antibiotics for possible pneumonia.  He converted to spontaneous sinus rhythm.  He had some sinus bradycardia with first-degree AV block and some 2.5-second pauses.  She was sent home on a low-dose beta-blocker and Eliquis.  Her echo demonstrated EF of 60 to 65% with normal wall motion.  She did have some dizziness.  She was to receive a 30-day event monitor.  Because of dizziness we did move her beta-blocker to the evening.    She wore a monitor and there was no evidence of atrial fib.    Since I last saw her she has been back and forth to doctors because she has been having back problems.  She has been having lower extremity swelling.  She started to have some venous insufficiency.  She actually on her own increased her Lasix to 20 mg daily because of lower extremity swelling.  The dizziness that she was having is really not as severe.  She is really not having any new palpitations, presyncope or syncope.  We did since I last saw her reduce her beta-blocker to half a pill once daily and she is tolerating this well.  The rash that we described the Eliquis has cleared.   Past Medical History:  Diagnosis Date   Achilles tendonitis    Anxiety    CAD (coronary artery disease)    RCA 40% stenosis cath 01/2011   Diverticular stricture [facility]) 2006   [facility]    DIVERTICULITIS, HX OF    Diverticulosis    DYSLIPIDEMIA    Elevated LFTs    GERD    Glaucoma    Hepatic steatosis    HOH (hard of hearing)    Immune thrombocytopenic purpura ([facility])    chronic - baseline 80-100K, on pred   Irritable bowel syndrome    Left ovarian cyst dx 01/2013 CT   working with gyn, ?malignant - elevated tumor marker OVA1   OSTEOARTHRITIS, KNEE, RIGHT    OSTEOPENIA    OVERACTIVE BLADDER    Plantar fasciitis    Prolapse of female bladder, acquired 11/2017   Torn rotator cuff    right shoulder   URINARY INCONTINENCE     Past Surgical History:  Procedure Laterality Date   ABDOMINAL HYSTERECTOMY  1963   APPENDECTOMY  1956   CARDIAC CATHETERIZATION     CATARACT EXTRACTION, BILATERAL  10/2010   CHOLECYSTECTOMY N/A 09/16/2014   Procedure: LAPAROSCOPIC CHOLECYSTECTOMY ;  Surgeon: Emelia Loron, MD;  Location: [facility];  Service: General;  Laterality: N/A;   KNEE ARTHROSCOPY Right    L pop PTA  10/2009   stent   LAPAROSCOPIC SIGMOID COLECTOMY  10/2005   SPLENECTOMY  1954   VARICOSE VEIN SURGERY Right 1962     Current Outpatient  Medications  Medication Sig Dispense Refill   acetaminophen (TYLENOL) 500 MG tablet Take 500 mg by mouth every 6 (six) hours as needed.     Biotin 2500 MCG CAPS Take 1 capsule by mouth daily.      calcium carbonate (TUMS - DOSED IN MG ELEMENTAL CALCIUM) 500 MG chewable tablet Chew 1 tablet by mouth daily as needed for indigestion or heartburn.     cycloSPORINE (RESTASIS) 0.05 % ophthalmic emulsion 1 drop 2 (two) times daily.     escitalopram (LEXAPRO) 5 MG tablet Take 1 tablet (5 mg total) by mouth daily. 90 tablet 3   famotidine (PEPCID) 20 MG tablet Take 1 tablet (20 mg total) by mouth 2 (two) times daily as needed for heartburn or indigestion. 180 tablet 3   Flaxseed MISC Take 5 mLs by mouth daily. Take 1 tsp daily     furosemide (LASIX) 20 MG tablet Take 40 mg (two tablets) for two days; then take 20 mg (one tablet) each day. 90  tablet 3   Glucos-Chond-Hyal Ac-Ca Fructo (MOVE FREE JOINT HEALTH ADVANCE PO) Take by mouth.     lidocaine (LMX) 4 % cream Apply 1 application topically as needed.     losartan (COZAAR) 25 MG tablet Take 1 tablet (25 mg total) by mouth daily. 90 tablet 1   Magnesium Oxide 250 MG TABS Take 1 tablet by mouth.     metoprolol succinate (TOPROL-XL) 25 MG 24 hr tablet TAKE 1/2 (ONE-HALF) TABLET BY MOUTH ONCE DAILY WITH OR IMMEDIATELY FOLLOWING A MEAL 30 tablet 0   Multiple Vitamin (MULTIVITAMIN) tablet Take 1 tablet by mouth daily.       polyethylene glycol (MIRALAX / GLYCOLAX) packet Take 17 g by mouth daily as needed for moderate constipation.      predniSONE (DELTASONE) 2.5 MG tablet Take 1 tablet (2.5 mg total) by mouth every other day. 45 tablet 3   Probiotic Product (PROBIOTIC DAILY PO) Take 1 tablet by mouth daily with breakfast.     psyllium (METAMUCIL) 58.6 % packet Take 1 packet by mouth daily as needed.     PSYLLIUM PO Take 1 tablet by mouth every other day.     trolamine salicylate (ASPERCREME) 10 % cream Apply 1 application topically as needed for muscle pain.     No current facility-administered medications for this visit.    Allergies:   Aspirin, Sulfa antibiotics, Contrast media [iodinated diagnostic agents], Shellfish-derived products, Eliquis [apixaban], Fluoxetine, Sulfamethoxazole-trimethoprim, Celebrex [celecoxib], Celexa [citalopram], and Statins    ROS:  Please see the history of present illness.   Otherwise, review of systems are positive for back pain, leg swelling.   All other systems are reviewed and negative.     PHYSICAL EXAM: VS:  BP (!) 142/76   Pulse 75   Ht 5\' 1"  (1.549 m)   Wt 155 lb 3.2 oz (70.4 kg)   SpO2 98%   BMI 29.32 kg/m  , BMI Body mass index is 29.32 kg/m. GENERAL:  Well appearing NECK:  No jugular venous distention, waveform within normal limits, carotid upstroke brisk and symmetric, no bruits, no thyromegaly LUNGS:  Clear to auscultation  bilaterally CHEST:  Unremarkable HEART:  PMI not displaced or sustained,S1 and S2 within normal limits, no S3, no S4, no clicks, no rubs, 2 out of 6 apical systolic murmur radiating slightly at the aortic outflow tract, no diastolic murmurs ABD:  Flat, positive bowel sounds normal in frequency in pitch, no bruits, no rebound, no guarding, no midline  pulsatile mass, no hepatomegaly, no splenomegaly EXT:  2 plus pulses throughout, no edema, no cyanosis no clubbing  EKG:  EKG is not ordered today.   Recent Labs: 12/14/2020: ALT 17 01/06/2021: BUN 23; Creatinine, Ser 0.89; Magnesium 2.1; Potassium 4.3; Sodium 136; TSH 1.984 02/24/2021: Hemoglobin 12.9; Platelet Count 157    Lipid Panel    Component Value Date/Time   CHOL 190 12/14/2020 1616   CHOL 215 (H) 06/22/2014 0825   TRIG 262.0 (H) 12/14/2020 1616   TRIG 161 (H) 06/22/2014 0825   TRIG 228 02/20/2010 0000   HDL 46.00 12/14/2020 1616   HDL 51 06/22/2014 0825   CHOLHDL 4 12/14/2020 1616   VLDL 52.4 (H) 12/14/2020 1616   LDLCALC 107 (H) 05/23/2017 1151   LDLCALC 132 (H) 06/22/2014 0825   LDLDIRECT 118.0 12/14/2020 1616      Wt Readings from Last 3 Encounters:  04/04/21 155 lb 3.2 oz (70.4 kg)  03/27/21 153 lb (69.4 kg)  03/14/21 154 lb (69.9 kg)      Other studies Reviewed: Additional studies/ records that were reviewed today include: Labs Review of the above records demonstrates:  NA   ASSESSMENT AND PLAN:   Dizziness Patient's dizziness improved.  No change in therapy.    Paroxysmal Atrial Fibrillation She had no evidence of this.  We have not documented any A. fib since her hospitalization and she does not know to be back on an anticoagulant unless she has proven A. fib.  She knows that we may see this in the future.  Of note she probably did have an allergic reaction with rash to the Eliquis.  She has a difficult time wearing monitor so we probably can do this again in the future.   Non-Obstructive CAD We will  continue with primary risk reduction   Mild to Moderate Aortic Regurgitation/Mild to Moderate Mitral Regurgitation /Mild Tricuspid Regurgitation I will follow this clinically  Hypertension Her blood pressure is well controlled.  No change in therapy.  Hyperlipidemia: She has been intolerant of statins.  No change in therapy.  Thrombocytopenia:  Her platelets have been stable in the 160.  No change.  Leg swelling: I will check a basic metabolic profile today.  Of note she does have increased swelling and I will give her an extra 20 mg of Lasix for the next 2 days.  current medicines are reviewed at length with the patient today.  The patient does not have concerns regarding medicines.  The following changes have been made:  As above  Labs/ tests ordered today include:     Orders Placed This Encounter  Procedures   Basic metabolic panel     Disposition:   FU with me in 3 months.    Signed, Rollene Rotunda, MD  04/04/2021 4:46 PM    [facility]

## 2021-04-04 ENCOUNTER — Other Ambulatory Visit: Payer: Self-pay

## 2021-04-04 ENCOUNTER — Encounter: Payer: Self-pay | Admitting: Cardiology

## 2021-04-04 ENCOUNTER — Ambulatory Visit: Payer: Medicare HMO | Admitting: Cardiology

## 2021-04-04 VITALS — BP 142/76 | HR 75 | Ht 61.0 in | Wt 155.2 lb

## 2021-04-04 DIAGNOSIS — I480 Paroxysmal atrial fibrillation: Secondary | ICD-10-CM

## 2021-04-04 DIAGNOSIS — I2510 Atherosclerotic heart disease of native coronary artery without angina pectoris: Secondary | ICD-10-CM

## 2021-04-04 DIAGNOSIS — R42 Dizziness and giddiness: Secondary | ICD-10-CM

## 2021-04-04 DIAGNOSIS — I351 Nonrheumatic aortic (valve) insufficiency: Secondary | ICD-10-CM

## 2021-04-04 MED ORDER — FUROSEMIDE 20 MG PO TABS
ORAL_TABLET | ORAL | 3 refills | Status: DC
Start: 2021-04-04 — End: 2022-02-06

## 2021-04-04 NOTE — Patient Instructions (Signed)
Medication Instructions:  Take furosemide 40 mg (two tablets) for two days; then take furosemide  20 mg (one tablet) each day. *If you need a refill on your cardiac medications before your next appointment, please call your pharmacy*   Lab Work: BMET today If you have labs (blood work) drawn today and your tests are completely normal, you will receive your results only by: [place] (if you have MyChart) OR A paper copy in the mail If you have any lab test that is abnormal or we need to change your treatment, we will call you to review the results.   Testing/Procedures: None  Follow-Up: At [facility], you and your health needs are our priority.  As part of our continuing mission to provide you with exceptional heart care, we have created designated Provider Care Teams.  These Care Teams include your primary Cardiologist (physician) and Advanced Practice Providers (APPs -  Physician Assistants and Nurse Practitioners) who all work together to provide you with the care you need, when you need it.  We recommend signing up for the patient portal called "MyChart".  Sign up information is provided on this After Visit Summary.  MyChart is used to connect with patients for Virtual Visits (Telemedicine).  Patients are able to view lab/test results, encounter notes, upcoming appointments, etc.  Non-urgent messages can be sent to your provider as well.   To learn more about what you can do with MyChart, go to [email or web address].    Your next appointment:   3 month(s)  The format for your next appointment:   In Person  Provider:   [name], MD

## 2021-04-05 LAB — BASIC METABOLIC PANEL
BUN/Creatinine Ratio: 18 (ref 12–28)
BUN: 17 mg/dL (ref 8–27)
CO2: 28 mmol/L (ref 20–29)
Calcium: 9.7 mg/dL (ref 8.7–10.3)
Chloride: 100 mmol/L (ref 96–106)
Creatinine, Ser: 0.94 mg/dL (ref 0.57–1.00)
Glucose: 91 mg/dL (ref 70–99)
Potassium: 4.5 mmol/L (ref 3.5–5.2)
Sodium: 142 mmol/L (ref 134–144)
eGFR: 58 mL/min/{1.73_m2} — ABNORMAL LOW (ref >59)

## 2021-04-06 DIAGNOSIS — H401134 Primary open-angle glaucoma, bilateral, indeterminate stage: Secondary | ICD-10-CM | POA: Diagnosis not present

## 2021-04-06 DIAGNOSIS — H04123 Dry eye syndrome of bilateral lacrimal glands: Secondary | ICD-10-CM | POA: Diagnosis not present

## 2021-04-07 ENCOUNTER — Telehealth: Payer: Self-pay | Admitting: Cardiology

## 2021-04-07 NOTE — Telephone Encounter (Signed)
   Pt c/o medication issue:  1. Name of Medication: furosemide (LASIX) 20 MG tablet  2. How are you currently taking this medication (dosage and times per day)? Take 40 mg (two tablets) for two days; then take 20 mg (one tablet) each day.  3. Are you having a reaction (difficulty breathing--STAT)?   4. What is your medication issue? Pt said, this meds did help a little bit but she wanted to know if she needs to stay on 40 mg ot 20 mg. She said to leave her a detailed message if she did not able to answer her phone

## 2021-04-07 NOTE — Telephone Encounter (Signed)
Called patient, advised of chart note from MD on 11/29- he suggested to take extra 20 mg lasix for 2 days, then decrease back to 20 mg daily.  LVM of the information to the patient and to call back if any questions or concerns.

## 2021-04-10 NOTE — Progress Notes (Signed)
[facility] [place] PROGRESS NOTE  ASSESSMENT / PLAN: Tamara Robertson is a 87 y.o. female with mild RLE medial calf inflammation in setting of "mixed" venous and arterial insufficiency. No improvement with Keflex. Will trial cortisone cream if PCP agrees. If no improvement in a month will need to consider angiogram.   SUBJECTIVE: Returns to clinic for evaluation.  Prefers to see MD. off and medial ankle has been bothering her for about a month.  It is red, swollen, tender.  She has been wearing compression without much relief.  OBJECTIVE: BP 137/64 (BP Location: Left Arm, Patient Position: Sitting, Cuff Size: Normal)   Pulse 77   Temp 98 F (36.7 C)   Resp 20   Ht 5\' 1"  (1.549 m)   Wt 155 lb (70.3 kg)   SpO2 99%   BMI 29.29 kg/m  Well-appearing in no acute distress Regular rate and rhythm Unlabored breathing Right lower extremity erythematous and tender to the touch over the medial malleolus.  1+ dorsalis pedis pulse bilaterally.  CBC Latest Ref Rng & Units 02/24/2021 01/13/2021 01/06/2021  WBC 4.0 - 10.5 K/uL 12.6(H) 12.6(H) 15.6(H)  Hemoglobin 12.0 - 15.0 g/dL 12.9 13.9 12.3  Hematocrit 36.0 - 46.0 % 38.8 41.7 36.8  Platelets 150 - 400 K/uL 157 160 PLATELET CLUMPS NOTED ON SMEAR, UNABLE TO ESTIMATE     CMP Latest Ref Rng & Units 04/04/2021 01/06/2021 01/05/2021  Glucose 70 - 99 mg/dL 91 114(H) 147(H)  BUN 8 - 27 mg/dL 17 23 20   Creatinine 0.57 - 1.00 mg/dL 0.94 0.89 0.81  Sodium 134 - 144 mmol/L 142 136 141  Potassium 3.5 - 5.2 mmol/L 4.5 4.3 4.1  Chloride 96 - 106 mmol/L 100 105 109  CO2 20 - 29 mmol/L 28 23 24   Calcium 8.7 - 10.3 mg/dL 9.7 8.2(L) 8.6(L)  Total Protein 6.0 - 8.3 g/dL - - -  Total Bilirubin 0.2 - 1.2 mg/dL - - -  Alkaline Phos 39 - 117 U/L - - -  AST 0 - 37 U/L - - -  ALT 0 - 35 U/L - - -   +-------+-----------+-----------+------------+------------+  ABI/TBIToday's ABIToday's TBIPrevious ABIPrevious TBI   +-------+-----------+-----------+------------+------------+  Right  0.73       0.25       0.79        0.65          +-------+-----------+-----------+------------+------------+  Left   0.76       0.40       0.91        0.64          +-------+-----------+-----------+------------+------------+   Right lower extremity venous reflux study: - No evidence of deep vein thrombosis seen in the right lower extremity, from the common femoral through the calf veins. - No evidence of superficial venous thrombosis in the right lower extremity. - Venous reflux is noted in the right greater saphenous vein in the calf. - Venous reflux is noted in the right popliteal vein. - Venous reflux is noted in the right short saphenous vein. - Venous reflux is noted in the right anterior accessory saphenous vein.    Yevonne Aline. Stanford Breed, MD [facility] Phone Number: [phone] 04/10/2021 9:30 PM

## 2021-04-11 ENCOUNTER — Other Ambulatory Visit: Payer: Self-pay

## 2021-04-11 ENCOUNTER — Ambulatory Visit: Payer: Medicare HMO | Admitting: Vascular Surgery

## 2021-04-11 ENCOUNTER — Encounter: Payer: Self-pay | Admitting: Vascular Surgery

## 2021-04-11 ENCOUNTER — Encounter: Payer: Medicare HMO | Admitting: Physical Therapy

## 2021-04-11 VITALS — BP 137/64 | HR 77 | Temp 98.0°F | Resp 20 | Ht 61.0 in | Wt 155.0 lb

## 2021-04-11 DIAGNOSIS — R21 Rash and other nonspecific skin eruption: Secondary | ICD-10-CM

## 2021-04-12 DIAGNOSIS — I872 Venous insufficiency (chronic) (peripheral): Secondary | ICD-10-CM | POA: Diagnosis not present

## 2021-04-12 DIAGNOSIS — L820 Inflamed seborrheic keratosis: Secondary | ICD-10-CM | POA: Diagnosis not present

## 2021-04-12 DIAGNOSIS — L570 Actinic keratosis: Secondary | ICD-10-CM | POA: Diagnosis not present

## 2021-04-12 DIAGNOSIS — D225 Melanocytic nevi of trunk: Secondary | ICD-10-CM | POA: Diagnosis not present

## 2021-04-12 DIAGNOSIS — L821 Other seborrheic keratosis: Secondary | ICD-10-CM | POA: Diagnosis not present

## 2021-04-12 DIAGNOSIS — L814 Other melanin hyperpigmentation: Secondary | ICD-10-CM | POA: Diagnosis not present

## 2021-04-12 DIAGNOSIS — Z23 Encounter for immunization: Secondary | ICD-10-CM | POA: Diagnosis not present

## 2021-04-13 ENCOUNTER — Ambulatory Visit: Payer: Medicare HMO | Attending: Orthopaedic Surgery | Admitting: Physical Therapy

## 2021-04-13 ENCOUNTER — Other Ambulatory Visit: Payer: Self-pay

## 2021-04-13 DIAGNOSIS — R262 Difficulty in walking, not elsewhere classified: Secondary | ICD-10-CM | POA: Diagnosis not present

## 2021-04-13 DIAGNOSIS — R600 Localized edema: Secondary | ICD-10-CM | POA: Diagnosis not present

## 2021-04-13 DIAGNOSIS — M6281 Muscle weakness (generalized): Secondary | ICD-10-CM | POA: Diagnosis not present

## 2021-04-13 DIAGNOSIS — M25571 Pain in right ankle and joints of right foot: Secondary | ICD-10-CM | POA: Diagnosis not present

## 2021-04-13 NOTE — Patient Instructions (Signed)
Access Code: [email or web address] URL: [email or web address] Date: 04/13/2021 Prepared by: Ruben Im  Exercises Seated Calf Stretch with Strap - 1 x daily - 7 x weekly - 1 sets - 3-5 reps - 10 hold Seated Arch Lifts - 1 x daily - 7 x weekly - 1 sets - 10 reps Seated Toe Towel Scrunches - 1 x daily - 7 x weekly - 1 sets - 10 reps Ankle Inversion Eversion Towel Slide - 1 x daily - 7 x weekly - 1 sets - 10 reps Seated Heel Raise - 1 x daily - 7 x weekly - 1 sets - 10 reps Seated Ankle Eversion with Resistance - 1 x daily - 7 x weekly - 1 sets - 5-10 reps - 10 hold Seated Ankle Plantar Flexion with Resistance Loop - 1 x daily - 7 x weekly - 3 sets - 5-10 reps - 10 hold Seated Ankle Dorsiflexion with Resistance - 1 x daily - 7 x weekly - 1 sets - 5-10 reps - 10 hold

## 2021-04-13 NOTE — Therapy (Signed)
[facility] @ [facility] [place], [place], [address] Phone: [phone]   Fax:  [phone]  Physical Therapy Treatment  Patient Details  Name: Tamara Robertson MRN: [id number] Date of Birth: Feb 21, 1930 Referring Provider (PT): Dr. Armond Hang   Encounter Date: 04/13/2021   PT End of Session - 04/13/21 1559     Visit Number 3    Date for PT Re-Evaluation 05/16/21    Authorization Type [employer] 10th visit progress note    PT Start Time 1445    PT Stop Time 1530    PT Time Calculation (min) 45 min    Activity Tolerance Patient tolerated treatment well             Past Medical History:  Diagnosis Date   Achilles tendonitis    Anxiety    CAD (coronary artery disease)    RCA 40% stenosis cath 01/2011   Diverticular stricture [facility]) 2006   [place], HX OF    Diverticulosis    DYSLIPIDEMIA    Elevated LFTs    GERD    Glaucoma    Hepatic steatosis    HOH (hard of hearing)    Immune thrombocytopenic purpura ([place])    chronic - baseline 80-100K, on pred   Irritable bowel syndrome    Left ovarian cyst dx 01/2013 CT   working with gyn, ?malignant - elevated tumor marker OVA1   OSTEOARTHRITIS, KNEE, RIGHT    OSTEOPENIA    OVERACTIVE BLADDER    Plantar fasciitis    Prolapse of female bladder, acquired 11/2017   Torn rotator cuff    right shoulder   URINARY INCONTINENCE     Past Surgical History:  Procedure Laterality Date   [place], BILATERAL  10/2010   CHOLECYSTECTOMY N/A 09/16/2014   Procedure: LAPAROSCOPIC CHOLECYSTECTOMY ;  Surgeon: Rolm Bookbinder, MD;  Location: [place];  Service: General;  Laterality: N/A;   KNEE ARTHROSCOPY Right    L pop PTA  10/2009   stent   LAPAROSCOPIC SIGMOID COLECTOMY  10/2005   [place]     There were no vitals filed for this visit.   Subjective Assessment - 04/13/21 1444     Subjective Some days better, some days not.  I think the patch helped.  Please do that again. I bought a pair of shorts for the pool.    Pertinent History rotator cuff tear massive has PT order;  A-fib;  spot on lung but not CA;  vascular problems with veins removed    Patient Stated Goals get a shoe on;  I've gained 10 pounds;  I can't do anything, I want to walk    Currently in Pain? Yes    Pain Score 4     Pain Location Leg    Pain Orientation Right;Lower    Pain Type Chronic pain                               [facility] Adult PT Treatment/Exercise - 04/13/21 0001       Iontophoresis   Type of Iontophoresis Dexamethasone    Location back of the heel    Dose 4 mg/ml #2   KT tape to adhere   Time 4-6 hour patch  treatment session.    Personal Factors and Comorbidities Age;Comorbidity 1;Comorbidity 2;Time since onset of injury/illness/exacerbation;Past/Current Experience    Comorbidities peripheral vascular disease, HTN, A fib    Examination-Activity Limitations Locomotion Level;Lift;Stand;Squat;Sit;Stairs    Rehab Potential Good    PT Frequency 2x / week    PT  Duration 8 weeks    PT Treatment/Interventions ADLs/Self Care Home Management;Aquatic Therapy;Aeronautical engineer;Taping;Manual techniques;Therapeutic exercise;Therapeutic activities;Neuromuscular re-education;Patient/family education    PT Next Visit Plan #3 ionto;  aquatic PT; unloading strengthening and ROM ankle/foot    PT Home Exercise Plan [email or web address]             Patient will benefit from skilled therapeutic intervention in order to improve the following deficits and impairments:  Difficulty walking, Pain, Decreased strength, Increased edema, Decreased activity tolerance, Impaired perceived functional ability  Visit Diagnosis: Pain in right ankle and joints of right foot  Difficulty in walking, not elsewhere classified  Muscle weakness (generalized)     Problem List Patient Active Problem List   Diagnosis Date Noted   Right Achilles bursitis 01/20/2021   Paroxysmal A-fib ([place]) 01/05/2021   Aortic atherosclerosis ([place]) 12/14/2020   Spinal stenosis of lumbar region with neurogenic claudication 12/14/2020   Melanocytic nevi of trunk 10/05/2020   Peripheral venous insufficiency 10/05/2020   Rosacea 10/05/2020   Inflamed seborrheic keratosis 10/05/2020   Temporal arteritis ([place]) 10/05/2020   Viral warts 10/05/2020   Ear pain, left 06/23/2020   Posterior neck pain 08/03/2019   Dizziness 08/03/2019   Diarrhea 03/13/2019   Nonrheumatic aortic valve stenosis 02/26/2019   Sensorineural hearing loss (SNHL), bilateral 01/30/2019   Chronic ITP (idiopathic thrombocytopenia) ([facility]) 12/01/2018   Lightheadedness 11/19/2018   Epistaxis 11/19/2018   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   Rash 03/25/2018   Chronic left-sided headaches 01/28/2018   SBO (small bowel obstruction) ([place]) 09/20/2017   Aortic stenosis, moderate 08/22/2017   CAD (coronary artery disease) 08/21/2017   Community acquired pneumonia 06/25/2017   Current chronic use of  systemic steroids 06/25/2017   Varicose veins of left lower extremity with complications 16/96/7893   Hypertension 07/31/2016   Leg edema 07/31/2016   Prediabetes 06/05/2016   Osteopenia 12/01/2015   Anxiety and depression 07/19/2015   Degenerative cervical disc 12/28/2014   Left ovarian cyst    Peripheral neuropathy 06/03/2012   PVD (peripheral vascular disease) ([place]) 11/23/2010   Bursitis of hip 10/26/2010   Hyperlipidemia 06/27/2010   Unspecified glaucoma 06/27/2010   GERD 06/27/2010   Irritable bowel syndrome 06/27/2010   OVERACTIVE BLADDER 06/27/2010   OSTEOARTHRITIS, KNEE, RIGHT 06/27/2010   URINARY INCONTINENCE 06/27/2010   Ruben Im, PT 04/13/21 4:00 PM Phone: [phone] Fax: [phone]   Alvera Singh, PT 04/13/2021, 3:59 PM  [place] [place] @ [place], [place], [address] Phone: [phone]   Fax:  [phone]  Name: Tamara [place] MRN: [id number] Date of Birth: 04-Jul-1929  treatment session.    Personal Factors and Comorbidities Age;Comorbidity 1;Comorbidity 2;Time since onset of injury/illness/exacerbation;Past/Current Experience    Comorbidities peripheral vascular disease, HTN, A fib    Examination-Activity Limitations Locomotion Level;Lift;Stand;Squat;Sit;Stairs    Rehab Potential Good    PT Frequency 2x / week    PT  Duration 8 weeks    PT Treatment/Interventions ADLs/Self Care Home Management;Aquatic Therapy;Aeronautical engineer;Taping;Manual techniques;Therapeutic exercise;Therapeutic activities;Neuromuscular re-education;Patient/family education    PT Next Visit Plan #3 ionto;  aquatic PT; unloading strengthening and ROM ankle/foot    PT Home Exercise Plan [id number]             Patient will benefit from skilled therapeutic intervention in order to improve the following deficits and impairments:  Difficulty walking, Pain, Decreased strength, Increased edema, Decreased activity tolerance, Impaired perceived functional ability  Visit Diagnosis: Pain in right ankle and joints of right foot  Difficulty in walking, not elsewhere classified  Muscle weakness (generalized)     Problem List Patient Active Problem List   Diagnosis Date Noted   Right Achilles bursitis 01/20/2021   Paroxysmal A-fib ([place]) 01/05/2021   Aortic atherosclerosis ([place]) 12/14/2020   Spinal stenosis of lumbar region with neurogenic claudication 12/14/2020   Melanocytic nevi of trunk 10/05/2020   Peripheral venous insufficiency 10/05/2020   Rosacea 10/05/2020   Inflamed seborrheic keratosis 10/05/2020   Temporal arteritis ([place]) 10/05/2020   Viral warts 10/05/2020   Ear pain, left 06/23/2020   Posterior neck pain 08/03/2019   Dizziness 08/03/2019   Diarrhea 03/13/2019   Nonrheumatic aortic valve stenosis 02/26/2019   Sensorineural hearing loss (SNHL), bilateral 01/30/2019   Chronic ITP (idiopathic thrombocytopenia) ([facility]CC) 12/01/2018   Lightheadedness 11/19/2018   Epistaxis 11/19/2018   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   Rash 03/25/2018   Chronic left-sided headaches 01/28/2018   SBO (small bowel obstruction) ([place]) 09/20/2017   Aortic stenosis, moderate 08/22/2017   CAD (coronary artery disease) 08/21/2017   Community acquired pneumonia 06/25/2017   Current chronic use of  systemic steroids 06/25/2017   Varicose veins of left lower extremity with complications 16/96/7893   Hypertension 07/31/2016   Leg edema 07/31/2016   Prediabetes 06/05/2016   Osteopenia 12/01/2015   Anxiety and depression 07/19/2015   Degenerative cervical disc 12/28/2014   Left ovarian cyst    Peripheral neuropathy 06/03/2012   PVD (peripheral vascular disease) ([place]) 11/23/2010   Bursitis of hip 10/26/2010   Hyperlipidemia 06/27/2010   Unspecified glaucoma 06/27/2010   GERD 06/27/2010   Irritable bowel syndrome 06/27/2010   OVERACTIVE BLADDER 06/27/2010   OSTEOARTHRITIS, KNEE, RIGHT 06/27/2010   URINARY INCONTINENCE 06/27/2010   Ruben Im, PT 04/13/21 4:00 PM Phone: [phone] Fax: [phone]   Alvera Singh, PT 04/13/2021, 3:59 PM  [facility] @ [place] [place], [place], [address] Phone: [phone]   Fax:  [phone]  Name: Tamara [place] MRN: [id number] Date of Birth: 04-Jul-1929

## 2021-04-17 NOTE — Progress Notes (Signed)
Subjective:    Patient ID: Tamara Robertson, female    DOB: 1930/04/13, 87 y.o.   MRN: [id number]  This visit occurred during the SARS-CoV-2 public health emergency.  Safety protocols were in place, including screening questions prior to the visit, additional usage of staff PPE, and extensive cleaning of exam room while observing appropriate contact time as indicated for disinfecting solutions.    HPI The patient is here for an acute visit.   Started coughing recently  - deep cough - it has not progressed.     Right leg pain - saw neuro on 11/2 for right foot pain that started a few days after epidural in back in August.  He prescribed lidocaine gel and thought her symptoms would improve with time - possible related to the procedure - neuropathy.  She is doing PT now for right ankle pain and pain in right foot joints.   She saw vascular and dermatology.  Vascular thought her symptoms were cellulitis and venous stasis dermatitis.  She took keflex - no improvement.  She is wearing compression socks daily and elevating her legs.  She is using a steroid cream daily.   Hot stabbing sensation in lower medial aspect of right leg and in toe.  Swelling right lower leg and foot.  Left lower leg and foot starting to swell.  Swelling decreases over night, but does not go away completely -- redness improves but not completely.     Pain comes in all positions.  More recent sleep has been affected.   Medications and allergies reviewed with patient and updated if appropriate.  Patient Active Problem List   Diagnosis Date Noted   Right Achilles bursitis 01/20/2021   Paroxysmal A-fib (HCC) 01/05/2021   Aortic atherosclerosis (HCC) 12/14/2020   Spinal stenosis of lumbar region with neurogenic claudication 12/14/2020   Melanocytic nevi of trunk 10/05/2020   Peripheral venous insufficiency 10/05/2020   Rosacea 10/05/2020   Inflamed seborrheic keratosis 10/05/2020   Temporal arteritis (HCC)  10/05/2020   Viral warts 10/05/2020   Ear pain, left 06/23/2020   Posterior neck pain 08/03/2019   Dizziness 08/03/2019   Diarrhea 03/13/2019   Nonrheumatic aortic valve stenosis 02/26/2019   Sensorineural hearing loss (SNHL), bilateral 01/30/2019   Chronic ITP (idiopathic thrombocytopenia) (HCC) 12/01/2018   Lightheadedness 11/19/2018   Epistaxis 11/19/2018   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   Rash 03/25/2018   Chronic left-sided headaches 01/28/2018   SBO (small bowel obstruction) (HCC) 09/20/2017   Aortic stenosis, moderate 08/22/2017   CAD (coronary artery disease) 08/21/2017   Community acquired pneumonia 06/25/2017   Current chronic use of systemic steroids 06/25/2017   Varicose veins of left lower extremity with complications 03/05/2017   Hypertension 07/31/2016   Leg edema 07/31/2016   Prediabetes 06/05/2016   Osteopenia 12/01/2015   Anxiety and depression 07/19/2015   Degenerative cervical disc 12/28/2014   Left ovarian cyst    Peripheral neuropathy 06/03/2012   PVD (peripheral vascular disease) (HCC) 11/23/2010   Bursitis of hip 10/26/2010   Hyperlipidemia 06/27/2010   Unspecified glaucoma 06/27/2010   GERD 06/27/2010   Irritable bowel syndrome 06/27/2010   OVERACTIVE BLADDER 06/27/2010   OSTEOARTHRITIS, KNEE, RIGHT 06/27/2010   URINARY INCONTINENCE 06/27/2010    Current Outpatient Medications on File Prior to Visit  Medication Sig Dispense Refill   acetaminophen (TYLENOL) 500 MG tablet Take 500 mg by mouth every 6 (six) hours as needed.     Biotin 2500 MCG CAPS Take 1 capsule by mouth  daily.      calcium carbonate (TUMS - DOSED IN MG ELEMENTAL CALCIUM) 500 MG chewable tablet Chew 1 tablet by mouth daily as needed for indigestion or heartburn.     cycloSPORINE (RESTASIS) 0.05 % ophthalmic emulsion 1 drop 2 (two) times daily.     escitalopram (LEXAPRO) 5 MG tablet Take 1 tablet (5 mg total) by mouth daily. 90 tablet 3   famotidine (PEPCID) 20 MG tablet Take 1  tablet (20 mg total) by mouth 2 (two) times daily as needed for heartburn or indigestion. 180 tablet 3   Flaxseed MISC Take 5 mLs by mouth daily. Take 1 tsp daily     furosemide (LASIX) 20 MG tablet Take 40 mg (two tablets) for two days; then take 20 mg (one tablet) each day. 90 tablet 3   Glucos-Chond-Hyal Ac-Ca Fructo (MOVE FREE JOINT HEALTH ADVANCE PO) Take by mouth.     lidocaine (LMX) 4 % cream Apply 1 application topically as needed.     losartan (COZAAR) 25 MG tablet Take 1 tablet (25 mg total) by mouth daily. 90 tablet 1   Magnesium Oxide 250 MG TABS Take 1 tablet by mouth.     metoprolol succinate (TOPROL-XL) 25 MG 24 hr tablet TAKE 1/2 (ONE-HALF) TABLET BY MOUTH ONCE DAILY WITH OR IMMEDIATELY FOLLOWING A MEAL 30 tablet 0   Multiple Vitamin (MULTIVITAMIN) tablet Take 1 tablet by mouth daily.       polyethylene glycol (MIRALAX / GLYCOLAX) packet Take 17 g by mouth daily as needed for moderate constipation.      predniSONE (DELTASONE) 2.5 MG tablet Take 1 tablet (2.5 mg total) by mouth every other day. 45 tablet 3   Probiotic Product (PROBIOTIC DAILY PO) Take 1 tablet by mouth daily with breakfast.     psyllium (METAMUCIL) 58.6 % packet Take 1 packet by mouth daily as needed.     PSYLLIUM PO Take 1 tablet by mouth every other day.     triamcinolone cream (KENALOG) 0.1 % SMARTSIG:2 Topical Twice Daily     trolamine salicylate (ASPERCREME) 10 % cream Apply 1 application topically as needed for muscle pain.     [DISCONTINUED] metoprolol succinate (TOPROL-XL) 25 MG 24 hr tablet Take 1 tablet (25 mg total) by mouth daily. 30 tablet 1   No current facility-administered medications on file prior to visit.    Past Medical History:  Diagnosis Date   Achilles tendonitis    Anxiety    CAD (coronary artery disease)    RCA 40% stenosis cath 01/2011   Diverticular stricture [facility]) 2006   [facility]   DIVERTICULITIS, HX OF    Diverticulosis    DYSLIPIDEMIA    Elevated LFTs     GERD    Glaucoma    Hepatic steatosis    HOH (hard of hearing)    Immune thrombocytopenic purpura (HCC)    chronic - baseline 80-100K, on pred   Irritable bowel syndrome    Left ovarian cyst dx 01/2013 CT   working with gyn, ?malignant - elevated tumor marker OVA1   OSTEOARTHRITIS, KNEE, RIGHT    OSTEOPENIA    OVERACTIVE BLADDER    Plantar fasciitis    Prolapse of female bladder, acquired 11/2017   Torn rotator cuff    right shoulder   URINARY INCONTINENCE     Past Surgical History:  Procedure Laterality Date   ABDOMINAL HYSTERECTOMY  1963   APPENDECTOMY  1956   CARDIAC CATHETERIZATION     CATARACT  EXTRACTION, BILATERAL  10/2010   CHOLECYSTECTOMY N/A 09/16/2014   Procedure: LAPAROSCOPIC CHOLECYSTECTOMY ;  Surgeon: Emelia Loron, MD;  Location: [facility] OR;  Service: General;  Laterality: N/A;   KNEE ARTHROSCOPY Right    L pop PTA  10/2009   stent   LAPAROSCOPIC SIGMOID COLECTOMY  10/2005   SPLENECTOMY  1954   VARICOSE VEIN SURGERY Right 1962    Social History   Socioeconomic History   Marital status: Widowed    Spouse name: Not on file   Number of children: 2   Years of education: Not on file   Highest education level: Not on file  Occupational History   Occupation: Retired    Comment: Nutritional therapist office  Tobacco Use   Smoking status: Never   Smokeless tobacco: Never  Vaping Use   Vaping Use: Never used  Substance and Sexual Activity   Alcohol use: No   Drug use: No   Sexual activity: Not Currently    Birth control/protection: Surgical    Comment: 1st intercourse- 19, partners- 2, hysterectomy  Other Topics Concern   Not on file  Social History Narrative   Retired Futures trader.    [place] to [place] from [place] 05/2010 to be close to kids   Social Determinants of Corporate investment banker Strain: Low Risk    Difficulty of Paying Living Expenses: Not hard at all  Food Insecurity: No Food Insecurity   Worried About Programme researcher, broadcasting/film/video in  the Last Year: Never true   Barista in the Last Year: Never true  Transportation Needs: No Transportation Needs   Lack of Transportation (Medical): No   Lack of Transportation (Non-Medical): No  Physical Activity: Inactive   Days of Exercise per Week: 0 days   Minutes of Exercise per Session: 0 min  Stress: No Stress Concern Present   Feeling of Stress : Not at all  Social Connections: Moderately Integrated   Frequency of Communication with Friends and Family: More than three times a week   Frequency of Social Gatherings with Friends and Family: Once a week   Attends Religious Services: More than 4 times per year   Active Member of [employer] or Organizations: Yes   Attends Banker Meetings: More than 4 times per year   Marital Status: Widowed    Family History  Problem Relation Age of Onset   Coronary artery disease Mother    Heart attack Mother 37   Hyperlipidemia Mother    Hypertension Mother    Stomach cancer Father    Hypertension Daughter    Hyperlipidemia Daughter    Arthritis Other        parent   Transient ischemic attack Other        parent    Review of Systems     Objective:   Vitals:   04/18/21 1527  BP: 140/78  Pulse: 82  Temp: 98.1 F (36.7 C)  SpO2: 97%   BP Readings from Last 3 Encounters:  04/18/21 140/78  04/11/21 137/64  04/04/21 (!) 142/76   Wt Readings from Last 3 Encounters:  04/18/21 156 lb 3.2 oz (70.9 kg)  04/11/21 155 lb (70.3 kg)  04/04/21 155 lb 3.2 oz (70.4 kg)   Body mass index is 29.51 kg/m.   Physical Exam Constitutional:      General: She is not in acute distress.    Appearance: Normal appearance. She is not ill-appearing.  HENT:  Head: Normocephalic and atraumatic.  Musculoskeletal:     Right lower leg: Edema (right 1/2 lower leg and foot - 1+ > LLE) present.     Left lower leg: Edema (1 + lower leg and foot) present.  Skin:    General: Skin is warm and dry.     Findings: Erythema (right lower  extremity erythema that is slightly warm no blister, breaks in skin) present. No lesion.  Neurological:     Mental Status: She is alert.           Assessment & Plan:    See Problem List for Assessment and Plan of chronic medical problems.

## 2021-04-18 ENCOUNTER — Ambulatory Visit (INDEPENDENT_AMBULATORY_CARE_PROVIDER_SITE_OTHER): Payer: Medicare HMO | Admitting: Internal Medicine

## 2021-04-18 ENCOUNTER — Ambulatory Visit: Payer: Medicare HMO | Admitting: Vascular Surgery

## 2021-04-18 ENCOUNTER — Encounter: Payer: Self-pay | Admitting: Internal Medicine

## 2021-04-18 ENCOUNTER — Other Ambulatory Visit: Payer: Self-pay

## 2021-04-18 VITALS — BP 140/78 | HR 82 | Temp 98.1°F | Ht 61.0 in | Wt 156.2 lb

## 2021-04-18 DIAGNOSIS — G6289 Other specified polyneuropathies: Secondary | ICD-10-CM

## 2021-04-18 DIAGNOSIS — I872 Venous insufficiency (chronic) (peripheral): Secondary | ICD-10-CM

## 2021-04-18 NOTE — Patient Instructions (Addendum)
° °  Medications changes include :   increase lasix for a couple of days  Your prescription(s) have been submitted to your pharmacy. Please take as directed and contact our office if you believe you are having problem(s) with the medication(s).

## 2021-04-18 NOTE — Assessment & Plan Note (Signed)
Acute on chronic Edema likely related to venous insufficiency - erythema is likely dermatitis - using steroid cream daily, wearing compression socks and elevating legs w/o much improvement abx did not help in recent past - So unlikely cellulitis Increase lasix to 40 mg daily for 1-2 days then decrease to 20 mg - she will update me on if this helps at all

## 2021-04-18 NOTE — Assessment & Plan Note (Signed)
Acute on chronic Has had pain in right first toe - had epidural in august and since then has had increased pain in right medial lower leg and toe -- likely neuropathy Deferred gabapentin due to possible side effects ? See neuro vs ortho

## 2021-04-19 ENCOUNTER — Ambulatory Visit (HOSPITAL_BASED_OUTPATIENT_CLINIC_OR_DEPARTMENT_OTHER): Payer: Medicare HMO | Admitting: Physical Therapy

## 2021-04-20 ENCOUNTER — Ambulatory Visit: Payer: Medicare HMO | Admitting: Physical Therapy

## 2021-04-21 ENCOUNTER — Encounter: Payer: Self-pay | Admitting: Internal Medicine

## 2021-04-21 DIAGNOSIS — I10 Essential (primary) hypertension: Secondary | ICD-10-CM

## 2021-04-21 DIAGNOSIS — R600 Localized edema: Secondary | ICD-10-CM

## 2021-04-24 ENCOUNTER — Other Ambulatory Visit (INDEPENDENT_AMBULATORY_CARE_PROVIDER_SITE_OTHER): Payer: Medicare HMO

## 2021-04-24 DIAGNOSIS — I10 Essential (primary) hypertension: Secondary | ICD-10-CM | POA: Diagnosis not present

## 2021-04-24 DIAGNOSIS — R600 Localized edema: Secondary | ICD-10-CM

## 2021-04-24 LAB — BASIC METABOLIC PANEL
BUN: 18 mg/dL (ref 6–23)
CO2: 32 mEq/L (ref 19–32)
Calcium: 9.8 mg/dL (ref 8.4–10.5)
Chloride: 101 mEq/L (ref 96–112)
Creatinine, Ser: 0.92 mg/dL (ref 0.40–1.20)
GFR: 55.16 mL/min — ABNORMAL LOW (ref >60.00)
Glucose, Bld: 115 mg/dL — ABNORMAL HIGH (ref 70–99)
Potassium: 4.6 mEq/L (ref 3.5–5.1)
Sodium: 140 mEq/L (ref 135–145)

## 2021-04-25 ENCOUNTER — Ambulatory Visit: Payer: Medicare HMO

## 2021-04-25 ENCOUNTER — Other Ambulatory Visit: Payer: Self-pay

## 2021-04-25 DIAGNOSIS — R262 Difficulty in walking, not elsewhere classified: Secondary | ICD-10-CM | POA: Diagnosis not present

## 2021-04-25 DIAGNOSIS — R600 Localized edema: Secondary | ICD-10-CM | POA: Diagnosis not present

## 2021-04-25 DIAGNOSIS — M25571 Pain in right ankle and joints of right foot: Secondary | ICD-10-CM

## 2021-04-25 DIAGNOSIS — M6281 Muscle weakness (generalized): Secondary | ICD-10-CM | POA: Diagnosis not present

## 2021-04-25 NOTE — Therapy (Addendum)
[facility] @ [place], [place], [address] Phone: [phone]   Fax:  [phone]  Physical Therapy Discharge  Patient Details  Name: Tamara Robertson MRN: [id number] Date of Birth: 1929-07-27 Referring Provider (PT): Dr. Armond Hang  Patient did not return after visit on 04/25/21.  We will DC at this time.    Encounter Date: 04/25/2021   PT End of Session - 04/25/21 1450     Visit Number 4    Date for PT Re-Evaluation 05/16/21    Authorization Type [employer] Medicare 10th visit progress note    PT Start Time 1400    PT Stop Time 1448    PT Time Calculation (min) 48 min    Activity Tolerance Patient tolerated treatment well    Behavior During Therapy [facility] for tasks assessed/performed             Past Medical History:  Diagnosis Date   Achilles tendonitis    Anxiety    CAD (coronary artery disease)    RCA 40% stenosis cath 01/2011   Diverticular stricture [facility]) 2006   [place], HX OF    Diverticulosis    DYSLIPIDEMIA    Elevated LFTs    GERD    Glaucoma    Hepatic steatosis    HOH (hard of hearing)    Immune thrombocytopenic purpura ([place])    chronic - baseline 80-100K, on pred   Irritable bowel syndrome    Left ovarian cyst dx 01/2013 CT   working with gyn, ?malignant - elevated tumor marker OVA1   OSTEOARTHRITIS, KNEE, RIGHT    OSTEOPENIA    OVERACTIVE BLADDER    Plantar fasciitis    Prolapse of female bladder, acquired 11/2017   Torn rotator cuff    right shoulder   URINARY INCONTINENCE     Past Surgical History:  Procedure Laterality Date   [place]     CATARACT EXTRACTION, BILATERAL  10/2010   CHOLECYSTECTOMY N/A 09/16/2014   Procedure: LAPAROSCOPIC CHOLECYSTECTOMY ;  Surgeon: Rolm Bookbinder, MD;  Location: [facility];  Service: General;  Laterality: N/A;   KNEE ARTHROSCOPY  Right    L pop PTA  10/2009   stent   LAPAROSCOPIC SIGMOID COLECTOMY  10/2005   [place]    There were no vitals filed for this visit.   Subjective Assessment - 04/25/21 1411     Subjective "Its not any better"  She was supposed to start the pool program but had some places cauterized at dermatologist and was not allowed to begin in the pool due to open wounds.    Pertinent History rotator cuff tear massive has PT order;  A-fib;  spot on lung but not CA;  vascular problems with veins removed    Limitations Walking;House hold activities    How long can you sit comfortably? some ankle pain with sitting    How long can you walk comfortably? 5 minutes    Diagnostic tests 2 spurs back of heel    Patient Stated Goals get a shoe on;  I've gained 10 pounds;  I can't do anything, I want to walk    Currently in Pain? Yes    Pain Score 8     Pain Location Heel   "alot of that is the shoe"   Pain Orientation Right  Personal Factors and Comorbidities Age;Comorbidity 1;Comorbidity 2;Time since onset of injury/illness/exacerbation;Past/Current Experience    Comorbidities peripheral vascular disease, HTN, A fib    Examination-Activity Limitations Locomotion Level;Lift;Stand;Squat;Sit;Stairs    Examination-Participation Restrictions Meal Prep;Cleaning;Community Activity    Stability/Clinical Decision Making Stable/Uncomplicated    Clinical Decision  Making Low    Rehab Potential Good    PT Frequency 2x / week    PT Duration 8 weeks    PT Treatment/Interventions ADLs/Self Care Home Management;Aquatic Therapy;Aeronautical engineer;Taping;Manual techniques;Therapeutic exercise;Therapeutic activities;Neuromuscular re-education;Patient/family education    PT Next Visit Plan #4 ionto;  aquatic PT will begin after first of year due to patients open wounds ; unloading strengthening and ROM ankle/foot    PT Home Exercise Plan [id number]    Consulted and Agree with Plan of Care Patient             Patient will benefit from skilled therapeutic intervention in order to improve the following deficits and impairments:  Difficulty walking, Pain, Decreased strength, Increased edema, Decreased activity tolerance, Impaired perceived functional ability  Visit Diagnosis: Pain in right ankle and joints of right foot  Difficulty in walking, not elsewhere classified  Muscle weakness (generalized)  Localized edema     Problem List Patient Active Problem List   Diagnosis Date Noted   Right Achilles bursitis 01/20/2021   Paroxysmal A-fib ([place]) 01/05/2021   Aortic atherosclerosis ([place]) 12/14/2020   Spinal stenosis of lumbar region with neurogenic claudication 12/14/2020   Melanocytic nevi of trunk 10/05/2020   Peripheral venous insufficiency 10/05/2020   Rosacea 10/05/2020   Inflamed seborrheic keratosis 10/05/2020   Temporal arteritis ([place]) 10/05/2020   Viral warts 10/05/2020   Ear pain, left 06/23/2020   Posterior neck pain 08/03/2019   Dizziness 08/03/2019   Diarrhea 03/13/2019   Nonrheumatic aortic valve stenosis 02/26/2019   Sensorineural hearing loss (SNHL), bilateral 01/30/2019   Chronic ITP (idiopathic thrombocytopenia) (HCC) 12/01/2018   Lightheadedness 11/19/2018   Epistaxis 11/19/2018   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   Rash 03/25/2018   Chronic left-sided headaches 01/28/2018    SBO (small bowel obstruction) ([place]) 09/20/2017   Aortic stenosis, moderate 08/22/2017   CAD (coronary artery disease) 08/21/2017   Community acquired pneumonia 06/25/2017   Current chronic use of systemic steroids 06/25/2017   Varicose veins of left lower extremity with complications 82/99/3716   Hypertension 07/31/2016   Leg edema 07/31/2016   Prediabetes 06/05/2016   Osteopenia 12/01/2015   Anxiety and depression 07/19/2015   Degenerative cervical disc 12/28/2014   Left ovarian cyst    Peripheral neuropathy 06/03/2012   PVD (peripheral vascular disease) ([place]) 11/23/2010   Bursitis of hip 10/26/2010   Hyperlipidemia 06/27/2010   Unspecified glaucoma 06/27/2010   GERD 06/27/2010   Irritable bowel syndrome 06/27/2010   OVERACTIVE BLADDER 06/27/2010   OSTEOARTHRITIS, KNEE, RIGHT 06/27/2010   URINARY INCONTINENCE 06/27/2010    Anderson Malta B. Mayre Bury, PT 05/18/2310:34 PM   [facility] @ [place], [place], [address] Phone: [phone]   Fax:  [phone]  Name: Neylan Koroma MRN: [id number] Date of Birth: March 27, 1930  [facility] @ [place], [place], [address] Phone: [phone]   Fax:  [phone]  Physical Therapy Discharge  Patient Details  Name: Tamara Robertson MRN: [id number] Date of Birth: 1929-07-27 Referring Provider (PT): Dr. Armond Hang  Patient did not return after visit on 04/25/21.  We will DC at this time.    Encounter Date: 04/25/2021   PT End of Session - 04/25/21 1450     Visit Number 4    Date for PT Re-Evaluation 05/16/21    Authorization Type Aetna Medicare 10th visit progress note    PT Start Time 1400    PT Stop Time 1448    PT Time Calculation (min) 48 min    Activity Tolerance Patient tolerated treatment well    Behavior During Therapy [facility] for tasks assessed/performed             Past Medical History:  Diagnosis Date   Achilles tendonitis    Anxiety    CAD (coronary artery disease)    RCA 40% stenosis cath 01/2011   Diverticular stricture [facility]) 2006   [place], HX OF    Diverticulosis    DYSLIPIDEMIA    Elevated LFTs    GERD    Glaucoma    Hepatic steatosis    HOH (hard of hearing)    Immune thrombocytopenic purpura ([place])    chronic - baseline 80-100K, on pred   Irritable bowel syndrome    Left ovarian cyst dx 01/2013 CT   working with gyn, ?malignant - elevated tumor marker OVA1   OSTEOARTHRITIS, KNEE, RIGHT    OSTEOPENIA    OVERACTIVE BLADDER    Plantar fasciitis    Prolapse of female bladder, acquired 11/2017   Torn rotator cuff    right shoulder   URINARY INCONTINENCE     Past Surgical History:  Procedure Laterality Date   [place]     CATARACT EXTRACTION, BILATERAL  10/2010   CHOLECYSTECTOMY N/A 09/16/2014   Procedure: LAPAROSCOPIC CHOLECYSTECTOMY ;  Surgeon: Rolm Bookbinder, MD;  Location: [facility];  Service: General;  Laterality: N/A;   KNEE ARTHROSCOPY  Right    L pop PTA  10/2009   stent   LAPAROSCOPIC SIGMOID COLECTOMY  10/2005   [place]    There were no vitals filed for this visit.   Subjective Assessment - 04/25/21 1411     Subjective "Its not any better"  She was supposed to start the pool program but had some places cauterized at dermatologist and was not allowed to begin in the pool due to open wounds.    Pertinent History rotator cuff tear massive has PT order;  A-fib;  spot on lung but not CA;  vascular problems with veins removed    Limitations Walking;House hold activities    How long can you sit comfortably? some ankle pain with sitting    How long can you walk comfortably? 5 minutes    Diagnostic tests 2 spurs back of heel    Patient Stated Goals get a shoe on;  I've gained 10 pounds;  I can't do anything, I want to walk    Currently in Pain? Yes    Pain Score 8     Pain Location Heel   "alot of that is the shoe"   Pain Orientation Right

## 2021-04-26 ENCOUNTER — Ambulatory Visit (HOSPITAL_BASED_OUTPATIENT_CLINIC_OR_DEPARTMENT_OTHER): Payer: Medicare HMO | Admitting: Physical Therapy

## 2021-04-27 ENCOUNTER — Encounter: Payer: Medicare HMO | Admitting: Physical Therapy

## 2021-04-27 ENCOUNTER — Telehealth: Payer: Self-pay

## 2021-05-02 ENCOUNTER — Ambulatory Visit: Payer: Medicare HMO | Admitting: Physical Therapy

## 2021-05-02 ENCOUNTER — Telehealth: Payer: Self-pay | Admitting: Internal Medicine

## 2021-05-02 NOTE — Telephone Encounter (Signed)
Spoke with patient today.  She was exposed to [place] on Saturday. She is going to take test and she has been told to call back and let me know results.  If results are positive we will get anti-viral sent in.  If results are negative I will see about getting her something sent in for cough.  She has been instructed to call and let me know what results are.  If she calls back please forward results to me.  Thanks

## 2021-05-02 NOTE — Telephone Encounter (Signed)
Izzard, Frederick Peers, CMA I spoke with patient for a CCM follow up, patient informed me that her Covid tests was Negative and would like to know if and when should she retests.

## 2021-05-02 NOTE — Telephone Encounter (Signed)
Patient calling in  Has had cough about 2 days 12.25.22.. patient thinks she has covid & is requesting the antiviral.. asked patient if she has taken a covid test yet & she said no but she will test in a few days  Requesting cb from nurse [phone]

## 2021-05-02 NOTE — Progress Notes (Signed)
Chronic Care Management Pharmacy Assistant   Name: Tamara Robertson  MRN: [id number] DOB: [birth year]  Reason for Encounter: Disease State - General Adherence Appt. Telephone 07/05/21 @ 11am   Recent office visits:  04/18/21 Burns (PCP) - Peripheral venous insufficiency. No med changes.  Recent consult visits:  04/11/21 [facility] (Vascular Surgery) - Rash. No med changes.  04/04/21 Hochrein (Cardiology) - Dizziness. Increase Lasix to 40 mg.  03/28/21 Seymour Bars (Gyn) - Well female exam with routine gynecological exam. Stop Cephalexin 500 mg.  03/14/21 - Hawken (Vascular) - Cellulitis of right lower extremity. Start Cephalexin 500 mg.  03/08/21 Terrial Rhodes (Neurology) - Headache, unspecified. Right foot pain.  03/01/21 Aundria Rud (Orthopedic Surgeon) - Pain in right shoulder.  02/27/21 Regino Schultze (Physical Med & Rehab) - Spinal stenosis, lumbar region with neurogenic claudication.  02/24/21 Shadad (Oncology) - Chronic ITP. No med changes.  02/20/21 Loreta Ave (Radiology) - Emphysema.   Hospital visits:  None in previous 6 months  Medications: Outpatient Encounter Medications as of 04/27/2021  Medication Sig   acetaminophen (TYLENOL) 500 MG tablet Take 500 mg by mouth every 6 (six) hours as needed.   Biotin 2500 MCG CAPS Take 1 capsule by mouth daily.    calcium carbonate (TUMS - DOSED IN MG ELEMENTAL CALCIUM) 500 MG chewable tablet Chew 1 tablet by mouth daily as needed for indigestion or heartburn.   cycloSPORINE (RESTASIS) 0.05 % ophthalmic emulsion 1 drop 2 (two) times daily.   escitalopram (LEXAPRO) 5 MG tablet Take 1 tablet (5 mg total) by mouth daily.   famotidine (PEPCID) 20 MG tablet Take 1 tablet (20 mg total) by mouth 2 (two) times daily as needed for heartburn or indigestion.   Flaxseed MISC Take 5 mLs by mouth daily. Take 1 tsp daily   furosemide (LASIX) 20 MG tablet Take 40 mg (two tablets) for two days; then take 20 mg (one tablet) each day.   Glucos-Chond-Hyal Ac-Ca Fructo (MOVE  FREE JOINT HEALTH ADVANCE PO) Take by mouth.   lidocaine (LMX) 4 % cream Apply 1 application topically as needed.   losartan (COZAAR) 25 MG tablet Take 1 tablet (25 mg total) by mouth daily.   Magnesium Oxide 250 MG TABS Take 1 tablet by mouth.   metoprolol succinate (TOPROL-XL) 25 MG 24 hr tablet TAKE 1/2 (ONE-HALF) TABLET BY MOUTH ONCE DAILY WITH OR IMMEDIATELY FOLLOWING A MEAL   Multiple Vitamin (MULTIVITAMIN) tablet Take 1 tablet by mouth daily.     polyethylene glycol (MIRALAX / GLYCOLAX) packet Take 17 g by mouth daily as needed for moderate constipation.    predniSONE (DELTASONE) 2.5 MG tablet Take 1 tablet (2.5 mg total) by mouth every other day.   Probiotic Product (PROBIOTIC DAILY PO) Take 1 tablet by mouth daily with breakfast.   psyllium (METAMUCIL) 58.6 % packet Take 1 packet by mouth daily as needed.   PSYLLIUM PO Take 1 tablet by mouth every other day.   triamcinolone cream (KENALOG) 0.1 % SMARTSIG:2 Topical Twice Daily   trolamine salicylate (ASPERCREME) 10 % cream Apply 1 application topically as needed for muscle pain.   [DISCONTINUED] metoprolol succinate (TOPROL-XL) 25 MG 24 hr tablet Take 1 tablet (25 mg total) by mouth daily.   No facility-administered encounter medications on file as of 04/27/2021.   Have you had any problems recently with your health? Patient was exposed to Covid on Saturday, patient took a home tests and it was Negative. Patient states she has been coughing since yesterday but has no fever  or other symptoms.  Have you had any problems with your pharmacy? Patient states no problems with pharmacy and she uses multiple pharmacies because her Lexapro is cheaper at [facility].   What issues or side effects are you having with your medications? Patient states no issues or side effects.   What would you like me to pass along to Toma Aran, CPP for them to help you with?  Patient acknowledged appointment for March 2023.   What can we do to take care of  you better? Patient states nothing at this time.  Forward tests results to [name],CMA to call patient back about retesting.  Care Gaps Colonoscopy - NA Diabetic Foot Exam - NA Mammogram - NA Ophthalmology - NA Dexa Scan - 04/19/19 Annual Well Visit - NA Micro albumin - NA Hemoglobin A1c - NA  Star Rating Drugs: Losartan - filled 03/09/21 90D  Benedict Needy, RMA Clinical Pharmacists Assistant [phone]

## 2021-05-03 ENCOUNTER — Other Ambulatory Visit: Payer: Self-pay | Admitting: Internal Medicine

## 2021-05-03 DIAGNOSIS — J208 Acute bronchitis due to other specified organisms: Secondary | ICD-10-CM | POA: Insufficient documentation

## 2021-05-03 MED ORDER — NIRMATRELVIR/RITONAVIR (PAXLOVID) TABLET (RENAL DOSING): 2.0000 | ORAL_TABLET | Freq: Two times a day (BID) | ORAL | 0 refills | Status: AC

## 2021-05-03 MED ORDER — PROMETHAZINE-DM 6.25-15 MG/5ML PO SYRP: 5.0000 mL | ORAL_SOLUTION | Freq: Four times a day (QID) | ORAL | 0 refills | Status: DC | PRN

## 2021-05-03 NOTE — Telephone Encounter (Signed)
Spoke with patient today. 

## 2021-05-03 NOTE — Telephone Encounter (Addendum)
Pt called to report that she tested POS COVID today 05/03/21.   Pt is requesting a call back before any Rx are prescribed.

## 2021-05-07 DIAGNOSIS — C3490 Malignant neoplasm of unspecified part of unspecified bronchus or lung: Secondary | ICD-10-CM

## 2021-05-07 DIAGNOSIS — K922 Gastrointestinal hemorrhage, unspecified: Secondary | ICD-10-CM

## 2021-05-07 HISTORY — DX: Malignant neoplasm of unspecified part of unspecified bronchus or lung: C34.90

## 2021-05-07 HISTORY — DX: Gastrointestinal hemorrhage, unspecified: K92.2

## 2021-05-09 ENCOUNTER — Encounter: Payer: Medicare HMO | Admitting: Physical Therapy

## 2021-05-10 ENCOUNTER — Ambulatory Visit (HOSPITAL_BASED_OUTPATIENT_CLINIC_OR_DEPARTMENT_OTHER): Payer: Medicare HMO | Admitting: Physical Therapy

## 2021-05-15 ENCOUNTER — Encounter: Payer: Self-pay | Admitting: Internal Medicine

## 2021-05-15 ENCOUNTER — Telehealth: Payer: Self-pay | Admitting: Internal Medicine

## 2021-05-15 NOTE — Telephone Encounter (Signed)
Pt called back in to like provider know that she tested POS again today 1/9 for COVID. Symptoms are worse this time. Pt complains of SOB, head cold, congested, headache, runny nose, coughing, stiff neck, fatigue and denies fever.  Pt tested POS for COVID 05/03/21 NEG 05/10/21 POS 05/15/21  Pt want is requesting some medication if possible. Pt is scheduled for a VV on 1/11.  Please update pt with recommendation [phone].

## 2021-05-15 NOTE — Telephone Encounter (Signed)
Caller connected to [facility] 1.7.2023.   Caller states she has had covid for 2 weeks. She tested negative last Wednesday, but she's getting it back again. She thinks she took the test too early. She has a cough, headache, body aches, chills, and hoarse sounding.  Advised to see HCP within 4 hours. Virtual appt scheduled

## 2021-05-15 NOTE — Telephone Encounter (Signed)
Since she took Paxlovid she may be having COVID rebound-it is not recommended to take an antiviral for this.  Message sent to her via [place], but please call and make sure she got it.

## 2021-05-16 ENCOUNTER — Encounter: Payer: Medicare HMO | Admitting: Physical Therapy

## 2021-05-16 NOTE — Telephone Encounter (Signed)
Spoke with patient today and she read message.

## 2021-05-16 NOTE — Telephone Encounter (Signed)
Last read by Shella Spearing at  5:18 PM on 05/15/2021.

## 2021-05-16 NOTE — Progress Notes (Signed)
Virtual Visit via telephone note  I connected with Tamara Robertson on 05/16/21 at  8:30 AM EST by telephone and verified that I am speaking with the correct person using two identifiers.   I discussed the limitations of evaluation and management by telemedicine and the availability of in person appointments. The patient expressed understanding and agreed to proceed.  Present for the visit:  Myself, Dr Billey Gosling, Tamara Robertson.  The patient is currently at home and I am in the office.    No referring provider.    History of Present Illness: This is an acute visit for covid  Tested positive and was treated with paxlovid.   Tested negative and then retested when she started to feel bad again.  She is positive again and was wondering if she needs to take an antiviral again  She did have a fever 3 days ago, but none since then.  She states nasal congestion, minimal sore throat irritation likely related to postnasal drip.  Little bit of a cough occasional shortness of breath some headaches and some lightheadedness.  She is taking Tylenol sinus  Leg swelling is better  -we will has some localized swelling-lump-taking has lump -- taking 1.5 pills daily (30 mg)  She has been experiencing black stools-that started 3 days ago.  That morning she had transient upper abdominal pain.  She had a normal bowel movement and then later had diarrhea from 10:30 AM to 2:30 PM and the stool was black.  She had another episode of black stool last night and again this morning but she was constipated this morning.  She has not taken any Pepto and is not taking any iron supplementation.   Ox 98%,  82 HR     Review of Systems  Constitutional:  Positive for fever (3 days ago).  HENT:  Positive for congestion and sore throat (minimal).   Respiratory:  Positive for cough and shortness of breath (occ).   Gastrointestinal:  Positive for melena. Negative for abdominal pain, heartburn and nausea.  Neurological:   Positive for headaches.       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    Dorie Rank to [place] from [place] 05/2010 to be close to kids   Social Determinants of Radio broadcast assistant Strain: Low Risk    Difficulty of Paying Living Expenses: Not hard at all  Food Insecurity: No Food Insecurity   Worried About Charity fundraiser in the Last Year: Never true   Arboriculturist in the Last Year: Never true  Transportation Needs: No Transportation Needs   Lack of Transportation (Medical): No   Lack of Transportation (Non-Medical): No  Physical Activity: Inactive   Days  of Exercise per Week: 0 days   Minutes of Exercise per Session: 0 min  Stress: No Stress Concern Present   Feeling of Stress : Not at all  Social Connections: Moderately Integrated   Frequency of Communication with Friends and Family: More than three times a week   Frequency of Social Gatherings with Friends and Family: Once a week   Attends Religious Services: More than 4 times per year   Active Member of [employer] or Organizations: Yes   Attends Archivist Meetings: More than 4 times per year   Marital Status: Widowed     Observations/Objective:    Assessment and Plan:  See Problem List for Assessment and Plan of chronic medical problems.   Follow Up  Instructions:    I discussed the assessment and treatment plan with the patient. The patient was provided an opportunity to ask questions and all were answered. The patient agreed with the plan and demonstrated an understanding of the instructions.   The patient was advised to call back or seek an in-person evaluation if the symptoms worsen or if the condition fails to improve as anticipated.  Time spent on telephone call: 25 minutes  Binnie Rail, MD

## 2021-05-17 ENCOUNTER — Ambulatory Visit (HOSPITAL_BASED_OUTPATIENT_CLINIC_OR_DEPARTMENT_OTHER): Payer: Medicare HMO | Admitting: Physical Therapy

## 2021-05-17 ENCOUNTER — Other Ambulatory Visit: Payer: Self-pay | Admitting: Internal Medicine

## 2021-05-17 ENCOUNTER — Encounter: Payer: Self-pay | Admitting: Internal Medicine

## 2021-05-17 ENCOUNTER — Telehealth (INDEPENDENT_AMBULATORY_CARE_PROVIDER_SITE_OTHER): Payer: Medicare Other | Admitting: Internal Medicine

## 2021-05-17 ENCOUNTER — Other Ambulatory Visit: Payer: Self-pay

## 2021-05-17 DIAGNOSIS — U071 COVID-19: Secondary | ICD-10-CM | POA: Diagnosis not present

## 2021-05-17 DIAGNOSIS — R600 Localized edema: Secondary | ICD-10-CM

## 2021-05-17 DIAGNOSIS — K921 Melena: Secondary | ICD-10-CM | POA: Diagnosis not present

## 2021-05-17 DIAGNOSIS — R918 Other nonspecific abnormal finding of lung field: Secondary | ICD-10-CM

## 2021-05-17 NOTE — Assessment & Plan Note (Signed)
Acute  started 3 days ago and she has had a few episodes Had 1 episode of transient upper abdominal pain, but no other pain Is currently not taking famotidine, but denies GERD On chronic prednisone for ITP-recent illness with COVID and still with symptoms ?  Gastritis, ulcer Restart Pepcid 20 mg twice daily since she has that at home CBC, CMP as soon as she is able to leave her house/done with quarantine Will try to check stool for blood-her daughter will see if she can help with stool cards Will order referral for GI

## 2021-05-17 NOTE — Assessment & Plan Note (Signed)
Acute Had recent COVID and was started on the antiviral-paxlovid now has rebound COVID Symptoms mild No need for additional antiviral at this time Oxygenation good at home vital signs stable Symptomatic treatment only-can continue Tylenol sinus, cough suppressants She will keep me up-to-date with how she is feeling and if she has any concerns.  She knows if she develops any increased shortness of breath or other concerning symptoms she will call EMS

## 2021-05-17 NOTE — Assessment & Plan Note (Signed)
Chronic Improved and she states now she has no swelling, just localized swelling in 1 month Sees vascular next Tuesday Has been taking Lasix 30 mg daily and that helps to keep the swelling controlled Can try going back to 20 mg daily, but discussed she may need to occasionally take an extra pill or an extra half a pill-dose can be vary depending on swelling Will get CMP when she is able to leave her house/then with COVID quarantine

## 2021-05-18 ENCOUNTER — Other Ambulatory Visit (INDEPENDENT_AMBULATORY_CARE_PROVIDER_SITE_OTHER): Payer: Medicare Other

## 2021-05-18 DIAGNOSIS — K921 Melena: Secondary | ICD-10-CM | POA: Diagnosis not present

## 2021-05-19 ENCOUNTER — Encounter: Payer: Self-pay | Admitting: Internal Medicine

## 2021-05-19 ENCOUNTER — Telehealth: Payer: Self-pay

## 2021-05-19 LAB — FECAL OCCULT BLOOD, IMMUNOCHEMICAL: Fecal Occult Bld: POSITIVE — AB

## 2021-05-19 NOTE — Telephone Encounter (Signed)
My Chart message sent

## 2021-05-19 NOTE — Telephone Encounter (Signed)
CRITICAL VALUE STICKER  CRITICAL VALUE: Ifob Positive  RECEIVER (on-site recipient of call): Shirlyn Goltz, CMA  DATE & TIME NOTIFIED: 05/19/21 9:26am  MESSENGER (representative from lab):  Ifob Positive  MD NOTIFIED: Dr. Quay Burow

## 2021-05-22 ENCOUNTER — Other Ambulatory Visit (INDEPENDENT_AMBULATORY_CARE_PROVIDER_SITE_OTHER): Payer: Medicare Other

## 2021-05-22 DIAGNOSIS — K921 Melena: Secondary | ICD-10-CM

## 2021-05-22 LAB — CBC WITH DIFFERENTIAL/PLATELET
Basophils Absolute: 0.1 10*3/uL (ref 0.0–0.1)
Basophils Relative: 0.7 % (ref 0.0–3.0)
Eosinophils Absolute: 0.1 10*3/uL (ref 0.0–0.7)
Eosinophils Relative: 1 % (ref 0.0–5.0)
HCT: 34.2 % — ABNORMAL LOW (ref 36.0–46.0)
Hemoglobin: 11.2 g/dL — ABNORMAL LOW (ref 12.0–15.0)
Lymphocytes Relative: 27.6 % (ref 12.0–46.0)
Lymphs Abs: 3.9 10*3/uL (ref 0.7–4.0)
MCHC: 32.8 g/dL (ref 30.0–36.0)
MCV: 85 fl (ref 78.0–100.0)
Monocytes Absolute: 0.9 10*3/uL (ref 0.1–1.0)
Monocytes Relative: 6.7 % (ref 3.0–12.0)
Neutro Abs: 9 10*3/uL — ABNORMAL HIGH (ref 1.4–7.7)
Neutrophils Relative %: 64 % (ref 43.0–77.0)
Platelets: 284 10*3/uL (ref 150.0–400.0)
RBC: 4.02 Mil/uL (ref 3.87–5.11)
RDW: 13.2 % (ref 11.5–15.5)
WBC: 14 10*3/uL — ABNORMAL HIGH (ref 4.0–10.5)

## 2021-05-22 LAB — COMPREHENSIVE METABOLIC PANEL
ALT: 16 U/L (ref 0–35)
AST: 19 U/L (ref 0–37)
Albumin: 4.1 g/dL (ref 3.5–5.2)
Alkaline Phosphatase: 67 U/L (ref 39–117)
BUN: 17 mg/dL (ref 6–23)
CO2: 27 mEq/L (ref 19–32)
Calcium: 9.3 mg/dL (ref 8.4–10.5)
Chloride: 101 mEq/L (ref 96–112)
Creatinine, Ser: 0.94 mg/dL (ref 0.40–1.20)
GFR: 53.72 mL/min — ABNORMAL LOW (ref >60.00)
Glucose, Bld: 121 mg/dL — ABNORMAL HIGH (ref 70–99)
Potassium: 4.5 mEq/L (ref 3.5–5.1)
Sodium: 136 mEq/L (ref 135–145)
Total Bilirubin: 0.3 mg/dL (ref 0.2–1.2)
Total Protein: 7.2 g/dL (ref 6.0–8.3)

## 2021-05-22 NOTE — Progress Notes (Signed)
[facility]  PROGRESS NOTE  ASSESSMENT / PLAN: Tamara Robertson is a 88 y.o. female with mild RLE medial calf inflammation in setting of "mixed" venous and arterial insufficiency. This has improved. Encouraged compression. No need for angiogram. Follow up with me as needed.  SUBJECTIVE: Returns to clinic for evaluation.  She feels better overall. Has trialed a topical steroid which seems to be helping. Trying her best with compression.  OBJECTIVE: There were no vitals taken for this visit. Well-appearing in no acute distress Regular rate and rhythm Unlabored breathing Right lower extremity erythematous and tender to the touch over the medial malleolus, much improved from previous.  1+ dorsalis pedis pulse bilaterally.  CBC Latest Ref Rng & Units 05/22/2021 02/24/2021 01/13/2021  WBC 4.0 - 10.5 K/uL 14.0(H) 12.6(H) 12.6(H)  Hemoglobin 12.0 - 15.0 g/dL 11.2(L) 12.9 13.9  Hematocrit 36.0 - 46.0 % 34.2(L) 38.8 41.7  Platelets 150.0 - 400.0 K/uL 284.0 157 160     CMP Latest Ref Rng & Units 05/22/2021 04/24/2021 04/04/2021  Glucose 70 - 99 mg/dL 121(H) 115(H) 91  BUN 6 - 23 mg/dL 17 18 17   Creatinine 0.40 - 1.20 mg/dL 0.94 0.92 0.94  Sodium 135 - 145 mEq/L 136 140 142  Potassium 3.5 - 5.1 mEq/L 4.5 4.6 4.5  Chloride 96 - 112 mEq/L 101 101 100  CO2 19 - 32 mEq/L 27 32 28  Calcium 8.4 - 10.5 mg/dL 9.3 9.8 9.7  Total Protein 6.0 - 8.3 g/dL 7.2 - -  Total Bilirubin 0.2 - 1.2 mg/dL 0.3 - -  Alkaline Phos 39 - 117 U/L 67 - -  AST 0 - 37 U/L 19 - -  ALT 0 - 35 U/L 16 - -   +-------+-----------+-----------+------------+------------+   ABI/TBI Today's ABI Today's TBI Previous ABI Previous TBI   +-------+-----------+-----------+------------+------------+   Right   0.73        0.25        0.79         0.65           +-------+-----------+-----------+------------+------------+   Left    0.76        0.40        0.91         0.64            +-------+-----------+-----------+------------+------------+   Right lower extremity venous reflux study: - No evidence of deep vein thrombosis seen in the right lower extremity, from the common femoral through the calf veins. - No evidence of superficial venous thrombosis in the right lower extremity. - Venous reflux is noted in the right greater saphenous vein in the calf. - Venous reflux is noted in the right popliteal vein. - Venous reflux is noted in the right short saphenous vein. - Venous reflux is noted in the right anterior accessory saphenous vein.    Yevonne Aline. Stanford Breed, MD [facility] Phone Number: [phone] 05/22/2021 8:05 PM

## 2021-05-23 ENCOUNTER — Ambulatory Visit: Payer: Medicare Other | Admitting: Vascular Surgery

## 2021-05-23 ENCOUNTER — Other Ambulatory Visit: Payer: Self-pay

## 2021-05-23 ENCOUNTER — Encounter: Payer: Medicare HMO | Admitting: Physical Therapy

## 2021-05-23 VITALS — BP 121/64 | HR 75 | Temp 97.6°F | Resp 20 | Ht 61.0 in | Wt 151.7 lb

## 2021-05-23 DIAGNOSIS — I872 Venous insufficiency (chronic) (peripheral): Secondary | ICD-10-CM

## 2021-05-24 ENCOUNTER — Encounter (HOSPITAL_BASED_OUTPATIENT_CLINIC_OR_DEPARTMENT_OTHER): Payer: Self-pay | Admitting: Physical Therapy

## 2021-05-24 ENCOUNTER — Ambulatory Visit (HOSPITAL_BASED_OUTPATIENT_CLINIC_OR_DEPARTMENT_OTHER): Payer: Medicare Other | Attending: Orthopaedic Surgery | Admitting: Physical Therapy

## 2021-05-24 DIAGNOSIS — M25571 Pain in right ankle and joints of right foot: Secondary | ICD-10-CM | POA: Diagnosis not present

## 2021-05-24 DIAGNOSIS — M6281 Muscle weakness (generalized): Secondary | ICD-10-CM | POA: Insufficient documentation

## 2021-05-24 DIAGNOSIS — R262 Difficulty in walking, not elsewhere classified: Secondary | ICD-10-CM | POA: Diagnosis present

## 2021-05-24 DIAGNOSIS — R600 Localized edema: Secondary | ICD-10-CM | POA: Insufficient documentation

## 2021-05-24 NOTE — Therapy (Signed)
[facility] [address] [place], [place], [address] Phone: [phone]   Fax:  [phone]  Physical Therapy Treatment  Patient Details  Name: Tamara Robertson MRN: [id number] Date of Birth: Feb 09, 1930 Referring Provider (PT): Dr. Netta Cedars   Encounter Date: 05/24/2021   PT End of Session - 05/24/21 1549     Visit Number 5    Date for PT Re-Evaluation 05/25/21    Authorization Type [employer] 10th visit progress note    Authorization Time Period 05/24/2021 to 05/25/2021    PT Start Time 1546    PT Stop Time 1630    PT Time Calculation (min) 44 min    Activity Tolerance Patient tolerated treatment well    Behavior During Therapy [facility] for tasks assessed/performed             Past Medical History:  Diagnosis Date   Achilles tendonitis    Anxiety    CAD (coronary artery disease)    RCA 40% stenosis cath 01/2011   Diverticular stricture [facility]) 2006   [facility]   DIVERTICULITIS, HX OF    Diverticulosis    DYSLIPIDEMIA    Elevated LFTs    GERD    Glaucoma    Hepatic steatosis    HOH (hard of hearing)    Immune thrombocytopenic purpura (HCC)    chronic - baseline 80-100K, on pred   Irritable bowel syndrome    Left ovarian cyst dx 01/2013 CT   working with gyn, ?malignant - elevated tumor marker OVA1   OSTEOARTHRITIS, KNEE, RIGHT    OSTEOPENIA    OVERACTIVE BLADDER    Plantar fasciitis    Prolapse of female bladder, acquired 11/2017   Torn rotator cuff    right shoulder   URINARY INCONTINENCE     Past Surgical History:  Procedure Laterality Date   ABDOMINAL HYSTERECTOMY  1963   APPENDECTOMY  1956   CARDIAC CATHETERIZATION     CATARACT EXTRACTION, BILATERAL  10/2010   CHOLECYSTECTOMY N/A 09/16/2014   Procedure: LAPAROSCOPIC CHOLECYSTECTOMY ;  Surgeon: Emelia Loron, MD;  Location: [facility] OR;  Service: General;  Laterality: N/A;   KNEE ARTHROSCOPY Right    L pop PTA  10/2009    stent   LAPAROSCOPIC SIGMOID COLECTOMY  10/2005   SPLENECTOMY  1954   VARICOSE VEIN SURGERY Right 1962    There were no vitals filed for this visit.   Subjective Assessment - 05/24/21 1855     Subjective "My ankle and foot are really swollen, are we getting in that big pool??"    Currently in Pain? Yes    Pain Score 5     Pain Location Leg    Pain Orientation Right    Pain Type Chronic pain    Pain Onset 1 to 4 weeks ago    Pain Frequency Intermittent                                        PT Education - 05/24/21 1857     Education Details properties of water, benefits of water therapy    Person(s) Educated Patient    Methods Explanation    Comprehension Verbalized understanding              PT Short Term Goals - 04/13/21 1558       PT SHORT TERM GOAL #1   Title  The patient will demonstrate compliance with initial HEP and basic self care strategies    Status Achieved               PT Long Term Goals - 03/21/21 1808       PT LONG TERM GOAL #1   Title The patient will be instructed in a HEP for LE strengthening    Time 8    Period Weeks    Status New    Target Date 05/16/21      PT LONG TERM GOAL #2   Title The patient will report a 50% improvement in right lower leg and heel pain with walking and standing    Time 8    Period Weeks    Status New      PT LONG TERM GOAL #3   Title The patient will be able to walk 8-10 minutes with assistive device if necessary    Time 8    Period Weeks    Status New      PT LONG TERM GOAL #4   Title The patient will have improved LE strength 4/5 needed to rise from a chair with min UE use    Time 8    Period Weeks    Status New      PT LONG TERM GOAL #5   Title FOTO score improved from 46% to 55% indicating improved function with less pain    Time 8    Period Weeks    Status New             Pt seen for aquatic therapy today.  Treatment took place in water 3.25-4.8 ft  in depth at the [facility] pool. Temp of water was 91.  Pt entered/exited the pool via stairs (step to pattern) independently with bilat rail. Introduction to setting  Walking using yellow noodle 4 widths ea fwd, backward and side stepping  Standing holding to wall: hip flex;add/abd; extension; 2x10.  DF;PF 2x20 Seated on 3rd water step: Right ankle Alphabet, flutter kick at hip with cues for DF/PF; add/abd 3x20 gentle -cycle 3 x 1 min  Pt requires buoyancy for support and to offload joints with strengthening exercises. Viscosity of the water is needed for resistance of strengthening; water current perturbations provides challenge to standing balance unsupported, requiring increased core activation.        Plan - 05/24/21 1600     Clinical Impression Statement Pt introduced to setting. She demontrates confidence and safety in environment.  She is directed through LE exercises standing and seated (70% submerged greatly decrasing weight bearing).  Pt instructed on benefits of hydrostatic pressure on improving circulation/veinous return after pt states concern over right ankle and foot edema.  Upon completion visually edema rle decreased with improved suppleness of calf.  (initially hard to palpation).  Pt will return to on land therapist for recertification. She is a good candidate fro aquatics to improve le function by decreasing edema and improving strength/ROM in painless environment.  Re-cert:  objective measures will be done by primary therapist at next land visit    Stability/Clinical Decision Making Stable/Uncomplicated    Clinical Decision Making Low    Rehab Potential Good    PT Frequency 2x / week    PT Duration 8 weeks    PT Treatment/Interventions ADLs/Self Care Home Management;Aquatic Therapy;Psychiatric nurse;Taping;Manual  techniques;Therapeutic exercise;Therapeutic activities;Neuromuscular re-education;Patient/family education    PT Next Visit Plan On land re-cert.  Aquatics: continued unloaded strengthening             Patient will benefit from skilled therapeutic intervention in order to improve the following deficits and impairments:  Difficulty walking, Pain, Decreased strength, Increased edema, Decreased activity tolerance, Impaired perceived functional ability  Visit Diagnosis: Pain in right ankle and joints of right foot - Plan: PT plan of care cert/re-cert  Difficulty in walking, not elsewhere classified - Plan: PT plan of care cert/re-cert  Muscle weakness (generalized) - Plan: PT plan of care cert/re-cert  Localized edema - Plan: PT plan of care cert/re-cert     Problem List Patient Active Problem List   Diagnosis Date Noted   Melena 05/17/2021   COVID 05/17/2021   Acute bronchitis due to COVID-19 virus 05/03/2021   Pain in joint of right shoulder 02/14/2021   Insertional Achilles tendinopathy 02/14/2021   Posterior calcaneal exostosis 02/14/2021   Right Achilles bursitis 01/20/2021   Paroxysmal A-fib (HCC) 01/05/2021   Aortic atherosclerosis (HCC) 12/14/2020   Spinal stenosis of lumbar region with neurogenic claudication 12/14/2020   Melanocytic nevi of trunk 10/05/2020   Peripheral venous insufficiency 10/05/2020   Rosacea 10/05/2020   Inflamed seborrheic keratosis 10/05/2020   Temporal arteritis (HCC) 10/05/2020   Viral warts 10/05/2020   Ear pain, left 06/23/2020   Posterior neck pain 08/03/2019   Dizziness 08/03/2019   Pain of left hip joint 07/24/2019   Diarrhea 03/13/2019   Nonrheumatic aortic valve stenosis 02/26/2019   Sensorineural hearing loss (SNHL), bilateral 01/30/2019   Chronic ITP (idiopathic thrombocytopenia) (HCC) 12/01/2018   Lightheadedness 11/19/2018   Epistaxis 11/19/2018   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   Rash 03/25/2018   Chronic left-sided  headaches 01/28/2018   SBO (small bowel obstruction) (HCC) 09/20/2017   Aortic stenosis, moderate 08/22/2017   CAD (coronary artery disease) 08/21/2017   Community acquired pneumonia 06/25/2017   Current chronic use of systemic steroids 06/25/2017   Varicose veins of left lower extremity with complications 03/05/2017   Hypertension 07/31/2016   Leg edema 07/31/2016   Prediabetes 06/05/2016   Osteopenia 12/01/2015   Anxiety and depression 07/19/2015   Degenerative cervical disc 12/28/2014   Left ovarian cyst    Peripheral neuropathy 06/03/2012   PVD (peripheral vascular disease) (HCC) 11/23/2010   Bursitis of hip 10/26/2010   Hyperlipidemia 06/27/2010   Unspecified glaucoma 06/27/2010   GERD 06/27/2010   Irritable bowel syndrome 06/27/2010   OVERACTIVE BLADDER 06/27/2010   OSTEOARTHRITIS, KNEE, RIGHT 06/27/2010   URINARY INCONTINENCE 06/27/2010    Rushie Chestnut) Desma Wilkowski MPT 05/24/2021, 7:16 PM  [facility] [address] [place], [place], [address] Phone: [phone]   Fax:  [phone]  Name: Tamara Robertson MRN: [id number] Date of Birth: [birth year]

## 2021-05-30 ENCOUNTER — Other Ambulatory Visit: Payer: Self-pay

## 2021-05-30 ENCOUNTER — Ambulatory Visit: Payer: Medicare Other | Attending: Orthopaedic Surgery | Admitting: Physical Therapy

## 2021-05-30 DIAGNOSIS — M25571 Pain in right ankle and joints of right foot: Secondary | ICD-10-CM | POA: Insufficient documentation

## 2021-05-30 DIAGNOSIS — R262 Difficulty in walking, not elsewhere classified: Secondary | ICD-10-CM | POA: Insufficient documentation

## 2021-05-30 DIAGNOSIS — M6281 Muscle weakness (generalized): Secondary | ICD-10-CM | POA: Insufficient documentation

## 2021-05-30 DIAGNOSIS — R600 Localized edema: Secondary | ICD-10-CM | POA: Insufficient documentation

## 2021-05-30 NOTE — Therapy (Addendum)
Problem List Patient Active Problem List   Diagnosis Date Noted   Melena 05/17/2021   COVID 05/17/2021   Acute bronchitis due to COVID-19 virus 05/03/2021   Pain in joint of right shoulder 02/14/2021   Insertional Achilles tendinopathy 02/14/2021   Posterior calcaneal exostosis 02/14/2021   Right Achilles bursitis 01/20/2021   Paroxysmal A-fib ([place]) 01/05/2021   Aortic atherosclerosis ([place]) 12/14/2020   Spinal stenosis of lumbar region with neurogenic claudication 12/14/2020   Melanocytic nevi of trunk 10/05/2020   Peripheral venous insufficiency 10/05/2020   Rosacea 10/05/2020   Inflamed seborrheic keratosis 10/05/2020   Temporal arteritis ([place]) 10/05/2020   Viral warts 10/05/2020   Ear pain, left 06/23/2020   Posterior neck pain 08/03/2019   Dizziness 08/03/2019   Pain of left hip joint 07/24/2019   Diarrhea 03/13/2019   Nonrheumatic  aortic valve stenosis 02/26/2019   Sensorineural hearing loss (SNHL), bilateral 01/30/2019   Chronic ITP (idiopathic thrombocytopenia) ([place]) 12/01/2018   Lightheadedness 11/19/2018   Epistaxis 11/19/2018   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   Rash 03/25/2018   Chronic left-sided headaches 01/28/2018   SBO (small bowel obstruction) ([place]) 09/20/2017   Aortic stenosis, moderate 08/22/2017   CAD (coronary artery disease) 08/21/2017   Community acquired pneumonia 06/25/2017   Current chronic use of systemic steroids 06/25/2017   Varicose veins of left lower extremity with complications 33/58/2518   Hypertension 07/31/2016   Leg edema 07/31/2016   Prediabetes 06/05/2016   Osteopenia 12/01/2015   Anxiety and depression 07/19/2015   Degenerative cervical disc 12/28/2014   Left ovarian cyst    Peripheral neuropathy 06/03/2012   PVD (peripheral vascular disease) ([place]) 11/23/2010   Bursitis of hip 10/26/2010   Hyperlipidemia 06/27/2010   Unspecified glaucoma 06/27/2010   GERD 06/27/2010   Irritable bowel syndrome 06/27/2010   OVERACTIVE BLADDER 06/27/2010   OSTEOARTHRITIS, KNEE, RIGHT 06/27/2010   URINARY INCONTINENCE 06/27/2010   Ruben Im, PT 05/30/21 5:40 PM Phone: [phone] Fax: [phone]  Alvera Singh, PT 05/30/2021, 5:37 PM  [facility] @ [address] [place], [place], [address] Phone: [phone]   Fax:  [phone]  Name: Tamara Robertson MRN: [id number] Date of Birth: Jan 15, 1930  [facility] @ [place], [place], [address] Phone: [phone]   Fax:  [phone]  Physical Therapy Treatment/Re-eval/Recertification/Discharge Summary  Patient Details  Name: Tamara Robertson MRN: [id number] Date of Birth: 1929-10-15 Referring Provider (PT): Dr. Armond Hang   Encounter Date: 05/30/2021   PT End of Session - 05/30/21 1627     Visit Number 6    Date for PT Re-Evaluation 08/08/21    Authorization Type [employer] Medicare 10th visit progress note    PT Start Time 1530    PT Stop Time 1615    PT Time Calculation (min) 45 min    Activity Tolerance Patient tolerated treatment well             Past Medical History:  Diagnosis Date   Achilles tendonitis    Anxiety    CAD (coronary artery disease)    RCA 40% stenosis cath 01/2011   Diverticular stricture [facility]) 2006   [place], HX OF    Diverticulosis    DYSLIPIDEMIA    Elevated LFTs    GERD    Glaucoma    Hepatic steatosis    HOH (hard of hearing)    Immune thrombocytopenic purpura ([place])    chronic - baseline 80-100K, on pred   Irritable bowel syndrome    Left ovarian cyst dx 01/2013 CT   working with gyn, ?malignant - elevated tumor marker OVA1   OSTEOARTHRITIS, KNEE, RIGHT    OSTEOPENIA    OVERACTIVE BLADDER    Plantar fasciitis    Prolapse of female bladder, acquired 11/2017   Torn rotator cuff    right shoulder   URINARY INCONTINENCE     Past Surgical History:  Procedure Laterality Date   [place], BILATERAL  10/2010   CHOLECYSTECTOMY N/A 09/16/2014   Procedure: LAPAROSCOPIC CHOLECYSTECTOMY ;  Surgeon: Rolm Bookbinder, MD;  Location: [facility];  Service: General;  Laterality: N/A;   KNEE ARTHROSCOPY Right    L pop PTA  10/2009   stent   LAPAROSCOPIC SIGMOID COLECTOMY  10/2005   [place]    There were no vitals filed for this visit.   Subjective Assessment - 05/30/21 1538     Subjective  I got Covid twice around Christmas and that's why I haven't been here.  The leg pain is about the same but I am about 50% better.   Now the other side is starting.  Went to vascular and they say it's "blood pooling."    Pertinent History rotator cuff tear massive has PT order;  A-fib;  spot on lung but not CA;  vascular problems with veins removed    Patient Stated Goals get a shoe on;  I've gained 10 pounds;  I can't do anything, I want to walk    Currently in Pain? Yes    Pain Score 4     Pain Location Heel    Pain Orientation Right    Pain Type Chronic pain                [facility] PT Assessment - 05/30/21 0001       Assessment   Medical Diagnosis achilles tendinopathy    Referring Provider (PT) Dr. Armond Hang    Next MD Visit as needed    Prior Therapy  [facility] @ [place], [place], [address] Phone: [phone]   Fax:  [phone]  Physical Therapy Treatment/Re-eval/Recertification/Discharge Summary  Patient Details  Name: Tamara Robertson MRN: [id number] Date of Birth: 1929-10-15 Referring Provider (PT): Dr. Armond Hang   Encounter Date: 05/30/2021   PT End of Session - 05/30/21 1627     Visit Number 6    Date for PT Re-Evaluation 08/08/21    Authorization Type [employer] Medicare 10th visit progress note    PT Start Time 1530    PT Stop Time 1615    PT Time Calculation (min) 45 min    Activity Tolerance Patient tolerated treatment well             Past Medical History:  Diagnosis Date   Achilles tendonitis    Anxiety    CAD (coronary artery disease)    RCA 40% stenosis cath 01/2011   Diverticular stricture [facility]) 2006   [place], HX OF    Diverticulosis    DYSLIPIDEMIA    Elevated LFTs    GERD    Glaucoma    Hepatic steatosis    HOH (hard of hearing)    Immune thrombocytopenic purpura ([place])    chronic - baseline 80-100K, on pred   Irritable bowel syndrome    Left ovarian cyst dx 01/2013 CT   working with gyn, ?malignant - elevated tumor marker OVA1   OSTEOARTHRITIS, KNEE, RIGHT    OSTEOPENIA    OVERACTIVE BLADDER    Plantar fasciitis    Prolapse of female bladder, acquired 11/2017   Torn rotator cuff    right shoulder   URINARY INCONTINENCE     Past Surgical History:  Procedure Laterality Date   [place], BILATERAL  10/2010   CHOLECYSTECTOMY N/A 09/16/2014   Procedure: LAPAROSCOPIC CHOLECYSTECTOMY ;  Surgeon: Rolm Bookbinder, MD;  Location: [facility];  Service: General;  Laterality: N/A;   KNEE ARTHROSCOPY Right    L pop PTA  10/2009   stent   LAPAROSCOPIC SIGMOID COLECTOMY  10/2005   [place]    There were no vitals filed for this visit.   Subjective Assessment - 05/30/21 1538     Subjective  I got Covid twice around Christmas and that's why I haven't been here.  The leg pain is about the same but I am about 50% better.   Now the other side is starting.  Went to vascular and they say it's "blood pooling."    Pertinent History rotator cuff tear massive has PT order;  A-fib;  spot on lung but not CA;  vascular problems with veins removed    Patient Stated Goals get a shoe on;  I've gained 10 pounds;  I can't do anything, I want to walk    Currently in Pain? Yes    Pain Score 4     Pain Location Heel    Pain Orientation Right    Pain Type Chronic pain                [facility] PT Assessment - 05/30/21 0001       Assessment   Medical Diagnosis achilles tendinopathy    Referring Provider (PT) Dr. Armond Hang    Next MD Visit as needed    Prior Therapy  Problem List Patient Active Problem List   Diagnosis Date Noted   Melena 05/17/2021   COVID 05/17/2021   Acute bronchitis due to COVID-19 virus 05/03/2021   Pain in joint of right shoulder 02/14/2021   Insertional Achilles tendinopathy 02/14/2021   Posterior calcaneal exostosis 02/14/2021   Right Achilles bursitis 01/20/2021   Paroxysmal A-fib ([place]) 01/05/2021   Aortic atherosclerosis ([place]) 12/14/2020   Spinal stenosis of lumbar region with neurogenic claudication 12/14/2020   Melanocytic nevi of trunk 10/05/2020   Peripheral venous insufficiency 10/05/2020   Rosacea 10/05/2020   Inflamed seborrheic keratosis 10/05/2020   Temporal arteritis ([place]) 10/05/2020   Viral warts 10/05/2020   Ear pain, left 06/23/2020   Posterior neck pain 08/03/2019   Dizziness 08/03/2019   Pain of left hip joint 07/24/2019   Diarrhea 03/13/2019   Nonrheumatic  aortic valve stenosis 02/26/2019   Sensorineural hearing loss (SNHL), bilateral 01/30/2019   Chronic ITP (idiopathic thrombocytopenia) ([place]) 12/01/2018   Lightheadedness 11/19/2018   Epistaxis 11/19/2018   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   Rash 03/25/2018   Chronic left-sided headaches 01/28/2018   SBO (small bowel obstruction) ([place]) 09/20/2017   Aortic stenosis, moderate 08/22/2017   CAD (coronary artery disease) 08/21/2017   Community acquired pneumonia 06/25/2017   Current chronic use of systemic steroids 06/25/2017   Varicose veins of left lower extremity with complications 33/58/2518   Hypertension 07/31/2016   Leg edema 07/31/2016   Prediabetes 06/05/2016   Osteopenia 12/01/2015   Anxiety and depression 07/19/2015   Degenerative cervical disc 12/28/2014   Left ovarian cyst    Peripheral neuropathy 06/03/2012   PVD (peripheral vascular disease) ([place]) 11/23/2010   Bursitis of hip 10/26/2010   Hyperlipidemia 06/27/2010   Unspecified glaucoma 06/27/2010   GERD 06/27/2010   Irritable bowel syndrome 06/27/2010   OVERACTIVE BLADDER 06/27/2010   OSTEOARTHRITIS, KNEE, RIGHT 06/27/2010   URINARY INCONTINENCE 06/27/2010   Ruben Im, PT 05/30/21 5:40 PM Phone: [phone] Fax: [phone]  Alvera Singh, PT 05/30/2021, 5:37 PM  [facility] @ [address] [place], [place], [address] Phone: [phone]   Fax:  [phone]  Name: Tamara Robertson MRN: [id number] Date of Birth: Jan 15, 1930

## 2021-05-30 NOTE — Patient Instructions (Signed)
Access Code: [email or web address] URL: [email or web address] Date: 05/30/2021 Prepared by: Ruben Im  Exercises Seated Calf Stretch with Strap - 1 x daily - 7 x weekly - 1 sets - 3-5 reps - 10 hold Seated Arch Lifts - 1 x daily - 7 x weekly - 1 sets - 10 reps Seated Toe Towel Scrunches - 1 x daily - 7 x weekly - 1 sets - 10 reps Ankle Inversion Eversion Towel Slide - 1 x daily - 7 x weekly - 1 sets - 10 reps Seated Heel Raise - 1 x daily - 7 x weekly - 1 sets - 10 reps Seated Ankle Eversion with Resistance - 1 x daily - 7 x weekly - 1 sets - 5-10 reps - 10 hold Seated Ankle Plantar Flexion with Resistance Loop - 1 x daily - 7 x weekly - 3 sets - 5-10 reps - 10 hold Seated Ankle Dorsiflexion with Resistance - 1 x daily - 7 x weekly - 1 sets - 5-10 reps - 10 hold Isometric Ankle Inversion - 1 x daily - 7 x weekly - 1 sets - 5 reps - 10 hold Long Sitting Isometric Ankle Eversion in Plantar Flexion with Ball at Wall - 1 x daily - 7 x weekly - 1 sets - 5 reps - 10 hold

## 2021-05-31 ENCOUNTER — Ambulatory Visit (HOSPITAL_BASED_OUTPATIENT_CLINIC_OR_DEPARTMENT_OTHER): Payer: Medicare Other | Admitting: Physical Therapy

## 2021-06-06 ENCOUNTER — Inpatient Hospital Stay: Admission: RE | Admit: 2021-06-06 | Payer: Medicare Other | Source: Ambulatory Visit

## 2021-06-09 ENCOUNTER — Encounter: Payer: Self-pay | Admitting: Internal Medicine

## 2021-06-12 ENCOUNTER — Ambulatory Visit: Payer: Medicare Other | Admitting: Podiatry

## 2021-06-12 ENCOUNTER — Encounter: Payer: Self-pay | Admitting: Podiatry

## 2021-06-12 ENCOUNTER — Other Ambulatory Visit: Payer: Self-pay

## 2021-06-12 DIAGNOSIS — M79675 Pain in left toe(s): Secondary | ICD-10-CM

## 2021-06-12 DIAGNOSIS — M9262 Juvenile osteochondrosis of tarsus, left ankle: Secondary | ICD-10-CM

## 2021-06-12 DIAGNOSIS — M79671 Pain in right foot: Secondary | ICD-10-CM | POA: Diagnosis not present

## 2021-06-12 DIAGNOSIS — I739 Peripheral vascular disease, unspecified: Secondary | ICD-10-CM

## 2021-06-12 DIAGNOSIS — M79674 Pain in right toe(s): Secondary | ICD-10-CM | POA: Diagnosis not present

## 2021-06-12 DIAGNOSIS — B351 Tinea unguium: Secondary | ICD-10-CM

## 2021-06-12 DIAGNOSIS — M9261 Juvenile osteochondrosis of tarsus, right ankle: Secondary | ICD-10-CM | POA: Diagnosis not present

## 2021-06-12 DIAGNOSIS — M79672 Pain in left foot: Secondary | ICD-10-CM

## 2021-06-15 ENCOUNTER — Ambulatory Visit (INDEPENDENT_AMBULATORY_CARE_PROVIDER_SITE_OTHER)
Admission: RE | Admit: 2021-06-15 | Discharge: 2021-06-15 | Disposition: A | Discharge status: Home / Self Care | Payer: Medicare Other | Source: Ambulatory Visit | Attending: Internal Medicine | Admitting: Internal Medicine

## 2021-06-15 ENCOUNTER — Other Ambulatory Visit: Payer: Self-pay

## 2021-06-15 DIAGNOSIS — R918 Other nonspecific abnormal finding of lung field: Secondary | ICD-10-CM

## 2021-06-18 NOTE — Progress Notes (Signed)
Subjective:  Patient ID: Tamara Robertson, female    DOB: October 26, 1929,  MRN: [id number]  Tamara Robertson presents to clinic today for for at risk foot care. Patient has h/o PAD and painful elongated mycotic toenails 1-5 bilaterally which are tender when wearing enclosed shoe gear. Pain is relieved with periodic professional debridement.  She is being followed by Dr. Lenell Antu in Vascular Surgery and last visit was 05/23/2021.  New problem(s):  Patient has been treated for painful heels bilaterally. She has known Haglund's deformity with posterior calcaneal bone spurs. She is undergoing PT.Would like recommendations to be able to tolerate shoe gear as shoes irritate the bone spurs when she ambulates.  PCP is Tamara Sanes, MD , and last visit was May 17, 2021.  Allergies  Allergen Reactions   Aspirin Other (See Comments)    ITP   Sulfa Antibiotics Other (See Comments)    dizziness   Contrast Media [Iodinated Contrast Media] Hives    CAT scan contrast only   Shellfish-Derived Products Hives    Other reaction(s): Other   Eliquis [Apixaban]     Rash    Fluoxetine     Did not feel well on it    Sulfamethoxazole-Trimethoprim     Other reaction(s): Unknown   Celebrex [Celecoxib] Other (See Comments)    Leg swelling   Celexa [Citalopram] Other (See Comments)    Felt out of it   Statins Other (See Comments)    Review of Systems: Negative except as noted in the HPI. Objective:   Constitutional Tamara Robertson is a pleasant 88 y.o. Caucasian female, WD, WN in NAD. AAO x 3.   Vascular CFT <3 seconds b/l LE. Faintly palpable pedal pulses b/l LE. Pedal hair absent b/l LE. Skin temperature gradient WNL b/l. No pain with calf compression b/l. No edema b/l LE. No cyanosis or clubbing noted b/l LE.  Neurologic Normal speech. Oriented to person, place, and time. Protective sensation intact 5/5 intact bilaterally with 10g monofilament b/l. Vibratory sensation intact b/l.  Dermatologic Pedal integument  with normal turgor, texture and tone b/l LE. No open wounds b/l. No interdigital macerations b/l. Toenails 1-5 b/l elongated, thickened, discolored with subungual debris. +Tenderness with dorsal palpation of nailplates. No hyperkeratotic or porokeratotic lesions present.  Orthopedic: Muscle strength 5/5 to all lower extremity muscle groups bilaterally. Visible/palpable bony exostoses posterior aspect of calcaneus bilaterally. Tenderness to palpation. No erythema, no edema, no drainage, no fluctuance..   Radiographs: None  Last A1c:  Hemoglobin A1C Latest Ref Rng & Units 12/14/2020 07/29/2020  HGBA1C 4.6 - 6.5 % 5.8 5.9  Some recent data might be hidden   LOWER EXTREMITY DOPPLER STUDY   Patient Name:  Tamara Robertson  Date of Exam:   03/09/2021  Medical Rec #: [id number]            Accession #:    [id number]  Date of Birth: 21-Aug-1929             Patient Gender: F  Patient Age:   88 years  Exam Location:  [facility]  Procedure:      VAS [place] ABI WITH/WO TBI  Referring Phys: Heath Lark    ---------------------------------------------------------------------------  Indications: Claudication, and peripheral artery disease.   High Risk Factors: Hyperlipidemia, coronary artery disease.     Vascular Interventions: Left SFA stent in [place], [place] - 2011.   Comparison Study: 10/07/2020: Rt ABI 0.79; Lt ABI 0.91.   Performing Technologist: Ethelle Lyon  Examination Guidelines: A complete evaluation includes at minimum, Doppler  waveform signals and systolic blood pressure reading at the level of  bilateral  brachial, anterior tibial, and posterior tibial arteries, when vessel  segments  are accessible. Bilateral testing is considered an integral part of a  complete  examination. Photoelectric Plethysmograph (PPG) waveforms and toe systolic  pressure readings are included as required and additional duplex testing  as  needed. Limited examinations for  reoccurring indications may be performed  as  noted.      ABI Findings:  +---------+------------------+-----+----------+----------------------------  ----+   Right     Rt Pressure (mmHg) Index Waveform   Comment                             +---------+------------------+-----+----------+----------------------------  ----+   Brachial                                      patient requests no bp to  right                                                   arm due to pain                     +---------+------------------+-----+----------+----------------------------  ----+   PTA       104                0.73  monophasic                                      +---------+------------------+-----+----------+----------------------------  ----+   DP        79                 0.56  monophasic                                      +---------+------------------+-----+----------+----------------------------  ----+   Great Toe 35                 0.25                                                  +---------+------------------+-----+----------+----------------------------  ----+   +---------+------------------+-----+----------+-------+   Left      Lt Pressure (mmHg) Index Waveform   Comment   +---------+------------------+-----+----------+-------+   Brachial  142                                           +---------+------------------+-----+----------+-------+   PTA       104                0.73  monophasic           +---------+------------------+-----+----------+-------+   DP        108  0.76  biphasic             +---------+------------------+-----+----------+-------+   Great Toe 57                 0.40                       +---------+------------------+-----+----------+-------+   +-------+-----------+-----------+------------+------------+   ABI/TBI Today's ABI Today's TBI Previous ABI Previous TBI   +-------+-----------+-----------+------------+------------+   Right   0.73         0.25        0.79         0.65           +-------+-----------+-----------+------------+------------+   Left    0.76        0.40        0.91         0.64           +-------+-----------+-----------+------------+------------+   Summary:  Right: Resting right ankle-brachial index indicates moderate right lower  extremity arterial disease. The right toe-brachial index is abnormal.   Left: Resting left ankle-brachial index indicates moderate left lower  extremity arterial disease. The left toe-brachial index is abnormal.       *See table(s) above for measurements and observations.       Electronically signed by Heath Lark on 03/09/2021 at 3:33:24 PM.          Final    Assessment:  No diagnosis found. Plan:  Patient was evaluated and treated and all questions answered. Consent given for treatment as described below: -She is being followed by Dr. Lenell Antu in Vascular Surgery. -She is being followed by Orthopedics for her Achilles Tendonitis and is in PT for Achilles Tendonitis.  -We reviewed gel heel cushions on [employer]. She will give these a try and see if this provides her some relief. -Mycotic toenails 1-5 bilaterally were debrided in length and girth with sterile nail nippers and dremel without incident. -Patient/POA to call should there be question/concern in the interim.  Return in about 9 weeks (around 08/14/2021).  Freddie Breech, DPM

## 2021-06-19 ENCOUNTER — Telehealth: Payer: Self-pay | Admitting: Internal Medicine

## 2021-06-19 DIAGNOSIS — R911 Solitary pulmonary nodule: Secondary | ICD-10-CM

## 2021-06-19 NOTE — Telephone Encounter (Signed)
Please call her-she has already seen the CT scan of her chest.  The nodule we were following up is slightly larger.  I would like her to see the lung specialist so that they can recommend the best way to evaluate this further.  I have ordered a referral and they will call her to schedule.

## 2021-06-19 NOTE — Telephone Encounter (Signed)
Spoke with patient this morning and info given. °

## 2021-06-20 ENCOUNTER — Telehealth: Payer: Self-pay

## 2021-06-20 NOTE — Chronic Care Management (AMB) (Signed)
Chronic Care Management Pharmacy Assistant   Name: Tamara Robertson  MRN: [id number] DOB: 08-03-1929  Tamara Robertson is an 88 y.o. year old female who presents for his follow-up CCM visit with the clinical pharmacist.  Reason for Encounter: Disease State-General     Recent office visits:  05/17/21 Pincus Sanes, MD-PCP (Melena) Blood work ordered, no med changes  Recent consult visits:  06/12/21 Freddie Breech, DPM-Podiatry (nail problem) No orders or med changes 05/23/21 Leonie Douglas, MD-Vascular Surgery (chronic venous insufficiency) No orders or med changes Hospital visits:  None in previous 6 months  Medications: Outpatient Encounter Medications as of 06/20/2021  Medication Sig   acetaminophen (TYLENOL) 500 MG tablet Take 500 mg by mouth every 6 (six) hours as needed.   Biotin 2500 MCG CAPS Take 1 capsule by mouth daily.    calcium carbonate (TUMS - DOSED IN MG ELEMENTAL CALCIUM) 500 MG chewable tablet Chew 1 tablet by mouth daily as needed for indigestion or heartburn.   celecoxib (CELEBREX) 200 MG capsule    cycloSPORINE (RESTASIS) 0.05 % ophthalmic emulsion 1 drop 2 (two) times daily.   escitalopram (LEXAPRO) 5 MG tablet Take 1 tablet (5 mg total) by mouth daily.   famotidine (PEPCID) 20 MG tablet Take 1 tablet (20 mg total) by mouth 2 (two) times daily as needed for heartburn or indigestion.   Flaxseed MISC Take 5 mLs by mouth daily. Take 1 tsp daily   furosemide (LASIX) 20 MG tablet Take 40 mg (two tablets) for two days; then take 20 mg (one tablet) each day.   Glucos-Chond-Hyal Ac-Ca Fructo (MOVE FREE JOINT HEALTH ADVANCE PO) Take by mouth.   lidocaine (LMX) 4 % cream Apply 1 application topically as needed.   losartan (COZAAR) 25 MG tablet Take 1 tablet (25 mg total) by mouth daily.   Magnesium Oxide 250 MG TABS Take 1 tablet by mouth.   metoprolol succinate (TOPROL-XL) 25 MG 24 hr tablet TAKE 1/2 (ONE-HALF) TABLET BY MOUTH ONCE DAILY WITH OR IMMEDIATELY FOLLOWING A  MEAL   metoprolol tartrate (LOPRESSOR) 25 MG tablet    Multiple Vitamin (MULTIVITAMIN) tablet Take 1 tablet by mouth daily.     PAXLOVID, 300/100, 20 x 150 MG & 10 x 100MG  TBPK Take 1 tablet by mouth 2 (two) times daily.   polyethylene glycol (MIRALAX / GLYCOLAX) packet Take 17 g by mouth daily as needed for moderate constipation.    predniSONE (DELTASONE) 2.5 MG tablet Take 1 tablet (2.5 mg total) by mouth every other day.   Probiotic Product (PROBIOTIC DAILY PO) Take 1 tablet by mouth daily with breakfast.   promethazine-dextromethorphan (PROMETHAZINE-DM) 6.25-15 MG/5ML syrup Take 5 mLs by mouth 4 (four) times daily as needed for cough.   psyllium (METAMUCIL) 58.6 % packet Take 1 packet by mouth daily as needed.   PSYLLIUM PO Take 1 tablet by mouth every other day.   triamcinolone cream (KENALOG) 0.1 % SMARTSIG:2 Topical Twice Daily   trolamine salicylate (ASPERCREME) 10 % cream Apply 1 application topically as needed for muscle pain.   [DISCONTINUED] metoprolol succinate (TOPROL-XL) 25 MG 24 hr tablet Take 1 tablet (25 mg total) by mouth daily.   No facility-administered encounter medications on file as of 06/20/2021.   Have you had any problems recently with your health?Patient states that she is doing ok, just waiting on referral to the pulmonologist about a lung issue  Have you had any problems with your pharmacy?Patient states that she is not  having any problems with getting medications or the cost of medications from the pharmacy  What issues or side effects are you having with your medications?Patient states that she is not having any side effects that she knows of  What would you like me to pass along to [facility] for them to help you with?  Patient states that she can't wait to talk to Jesusita Oka, hope is is as nice as Mardella Layman  What can we do to take care of you better? Patient states she does not need anything at this time  Care Gaps: Colonoscopy-NA Diabetic Foot  Exam-NA Mammogram-NA Ophthalmology-NA Dexa Scan - NA Annual Well Visit - NA Micro albumin-NA Hemoglobin A1c- 12/14/20  Star Rating Drugs: Losartan 25 mg-last fill 03/09/21 90 ds   Velvet Bathe Clinical Pharmacist Assistant [phone]

## 2021-06-25 NOTE — Progress Notes (Signed)
Cardiology Office Note   Date:  06/27/2021   ID:  Tamara Robertson, Tamara Robertson March 21, 1930, MRN [id number]   PCP:  Pincus Sanes, MD  Cardiologist:   Rollene Rotunda, MD   Chief Complaint  Patient presents with   Dizziness       History of Present Illness: Tamara Robertson is a 88 y.o. female who presents for evaluation of PT.  She has had non obstructive CAD.    She had remote PTA in [place], and chronic LE edema.     She was in the hospital recently with new onset atrial fibrillation with rapid ventricular response.  He had demand ischemia with elevated troponins.  She was treated with IV diltiazem and also antibiotics for possible pneumonia.  He converted to spontaneous sinus rhythm.  He had some sinus bradycardia with first-degree AV block and some 2.5-second pauses.  She was sent home on a low-dose beta-blocker and Eliquis.  Her echo demonstrated EF of 60 to 65% with normal wall motion.  She did have some dizziness.  She was to receive a 30-day event monitor.  Because of dizziness we did move her beta-blocker to the evening.    She wore a monitor and there was no evidence of atrial fib.    Since I last saw her she had a CT of her chest.  It suggest that there is been some enlargement of the subsolid nodular opacity in the anterior right upper lobe.  She is going to see pulmonary.  There are some fibrotic scarring unchanged.  She has noted to have coronary calcium.  She is also states she has had some black stools and has had positive guaiac.  She is very mildly anemic.  She is having a dry nonproductive cough.  She is not having any new shortness of breath but there is some dyspnea on exertion.  There is no PND or orthopnea.  She is not describing new palpitations, presyncope or syncope.  She has chronic lower extremity swelling that she thinks is a little bit better.  Past Medical History:  Diagnosis Date   Achilles tendonitis    Anxiety    CAD (coronary artery disease)    RCA 40%  stenosis cath 01/2011   Diverticular stricture [facility]) 2006   [facility]   DIVERTICULITIS, HX OF    Diverticulosis    DYSLIPIDEMIA    Elevated LFTs    GERD    Glaucoma    Hepatic steatosis    HOH (hard of hearing)    Immune thrombocytopenic purpura (HCC)    chronic - baseline 80-100K, on pred   Irritable bowel syndrome    Left ovarian cyst dx 01/2013 CT   working with gyn, ?malignant - elevated tumor marker OVA1   OSTEOARTHRITIS, KNEE, RIGHT    OSTEOPENIA    OVERACTIVE BLADDER    Plantar fasciitis    Prolapse of female bladder, acquired 11/2017   Torn rotator cuff    right shoulder   URINARY INCONTINENCE     Past Surgical History:  Procedure Laterality Date   ABDOMINAL HYSTERECTOMY  1963   APPENDECTOMY  1956   CARDIAC CATHETERIZATION     CATARACT EXTRACTION, BILATERAL  10/2010   CHOLECYSTECTOMY N/A 09/16/2014   Procedure: LAPAROSCOPIC CHOLECYSTECTOMY ;  Surgeon: Emelia Loron, MD;  Location: [facility] OR;  Service: General;  Laterality: N/A;   KNEE ARTHROSCOPY Right    L pop PTA  10/2009   stent   LAPAROSCOPIC SIGMOID  COLECTOMY  10/2005   SPLENECTOMY  1954   VARICOSE VEIN SURGERY Right 1962     Current Outpatient Medications  Medication Sig Dispense Refill   acetaminophen (TYLENOL) 500 MG tablet Take 500 mg by mouth every 6 (six) hours as needed.     Biotin 2500 MCG CAPS Take 1 capsule by mouth daily.      calcium carbonate (TUMS - DOSED IN MG ELEMENTAL CALCIUM) 500 MG chewable tablet Chew 1 tablet by mouth daily as needed for indigestion or heartburn.     cycloSPORINE (RESTASIS) 0.05 % ophthalmic emulsion 1 drop 2 (two) times daily.     escitalopram (LEXAPRO) 5 MG tablet Take 1 tablet (5 mg total) by mouth daily. 90 tablet 3   famotidine (PEPCID) 20 MG tablet Take 1 tablet (20 mg total) by mouth 2 (two) times daily as needed for heartburn or indigestion. 180 tablet 3   Flaxseed MISC Take 5 mLs by mouth daily. Take 1 tsp daily     furosemide (LASIX) 20  MG tablet Take 40 mg (two tablets) for two days; then take 20 mg (one tablet) each day. 90 tablet 3   Glucos-Chond-Hyal Ac-Ca Fructo (MOVE FREE JOINT HEALTH ADVANCE PO) Take by mouth.     lidocaine (LMX) 4 % cream Apply 1 application topically as needed.     Magnesium Oxide 250 MG TABS Take 1 tablet by mouth.     metoprolol succinate (TOPROL-XL) 25 MG 24 hr tablet TAKE 1/2 (ONE-HALF) TABLET BY MOUTH ONCE DAILY WITH OR IMMEDIATELY FOLLOWING A MEAL 30 tablet 0   Multiple Vitamin (MULTIVITAMIN) tablet Take 1 tablet by mouth daily.       polyethylene glycol (MIRALAX / GLYCOLAX) packet Take 17 g by mouth daily as needed for moderate constipation.      predniSONE (DELTASONE) 2.5 MG tablet Take 1 tablet (2.5 mg total) by mouth every other day. 45 tablet 3   Probiotic Product (PROBIOTIC DAILY PO) Take 1 tablet by mouth daily with breakfast.     promethazine-dextromethorphan (PROMETHAZINE-DM) 6.25-15 MG/5ML syrup Take 5 mLs by mouth 4 (four) times daily as needed for cough. 118 mL 0   psyllium (METAMUCIL) 58.6 % packet Take 1 packet by mouth daily as needed.     PSYLLIUM PO Take 1 tablet by mouth every other day.     triamcinolone cream (KENALOG) 0.1 % SMARTSIG:2 Topical Twice Daily     trolamine salicylate (ASPERCREME) 10 % cream Apply 1 application topically as needed for muscle pain.     No current facility-administered medications for this visit.    Allergies:   Aspirin, Sulfa antibiotics, Contrast media [iodinated contrast media], Shellfish-derived products, Eliquis [apixaban], Fluoxetine, Sulfamethoxazole-trimethoprim, Celebrex [celecoxib], Celexa [citalopram], and Statins    ROS:  Please see the history of present illness.   Otherwise, review of systems are positive for back pain.   All other systems are reviewed and negative.     PHYSICAL EXAM: VS:  BP (!) 126/58    Pulse 88    Ht 5\' 2"  (1.575 m)    Wt 151 lb 12.8 oz (68.9 kg)    SpO2 99%    BMI 27.76 kg/m  , BMI Body mass index is 27.76  kg/m. GENERAL:  Well appearing HEENT:  Pupils equal round and reactive, fundi not visualized, oral mucosa unremarkable NECK:  No jugular venous distention, waveform within normal limits, carotid upstroke brisk and symmetric, no bruits, no thyromegaly LYMPHATICS:  No cervical, inguinal adenopathy LUNGS:  Clear to auscultation  bilaterally BACK:  No CVA tenderness CHEST:  Unremarkable HEART:  PMI not displaced or sustained,S1 and S2 within normal limits, no S3, no S4, no clicks, no rubs, 3 out of 6 apical systolic murmur and heard slightly in the axilla as well, no diastolic murmurs appreciated murmurs ABD:  Flat, positive bowel sounds normal in frequency in pitch, no bruits, no rebound, no guarding, no midline pulsatile mass, no hepatomegaly, no splenomegaly EXT:  2 plus pulses throughout, no edema, no cyanosis no clubbing SKIN:  No rashes no nodules NEURO:  Cranial nerves II through XII grossly intact, motor grossly intact throughout PSYCH:  Cognitively intact, oriented to person place and time  EKG:  EKG is  ordered today. Sinus rhythm, rate 88, axis within normal limits, intervals within normal limits, no acute ST-T wave changes.  Recent Labs: 01/06/2021: Magnesium 2.1; TSH 1.984 05/22/2021: ALT 16; Tamara Robertson 17; Creatinine, Ser 0.94; Hemoglobin 11.2; Platelets 284.0; Potassium 4.5; Sodium 136    Lipid Panel    Component Value Date/Time   CHOL 190 12/14/2020 1616   CHOL 215 (H) 06/22/2014 0825   TRIG 262.0 (H) 12/14/2020 1616   TRIG 161 (H) 06/22/2014 0825   TRIG 228 02/20/2010 0000   HDL 46.00 12/14/2020 1616   HDL 51 06/22/2014 0825   CHOLHDL 4 12/14/2020 1616   VLDL 52.4 (H) 12/14/2020 1616   LDLCALC 107 (H) 05/23/2017 1151   LDLCALC 132 (H) 06/22/2014 0825   LDLDIRECT 118.0 12/14/2020 1616      Wt Readings from Last 3 Encounters:  06/27/21 151 lb 12.8 oz (68.9 kg)  05/23/21 151 lb 11.2 oz (68.8 kg)  04/18/21 156 lb 3.2 oz (70.9 kg)      Other studies Reviewed: Additional  studies/ records that were reviewed today include:  CT scan, labs Review of the above records demonstrates: See elsewhere   ASSESSMENT AND PLAN:   Dizziness She still has some of this.  We discovered that her blood pressure cuff actually recorded 20 mm higher than readings by her home health nurse or here in the office.  Therefore, readings that she is showing me are actually overestimated.  Because of this, the dizziness and her cough ongoing to discontinue her ACE inhibitor.  Paroxysmal Atrial Fibrillation Has not seen recurrence of this.  She did have it previously documented in the hospital.  However, she had a rash on Eliquis.  She now has some guaiac positive stools so she remains off of DOAC.   Non-Obstructive CAD She has some mild nonobstructive disease.  No change in therapy.   Mild to Moderate Aortic Regurgitation/Mild to Moderate Mitral Regurgitation: I will follow this clinically she also has some tricuspid regurgitation which I will follow.  Hypertension As above I am going to discontinue her Cozaar.   Hyperlipidemia: She has been intolerant of statins.  No change in therapy.  Thrombocytopenia:  Her platelets have been stable in the 160.  No change.  Leg swelling: I will check a basic metabolic profile today.  Of note she does have increased swelling and I will give her an extra 20 mg of Lasix for the next 2 days.  current medicines are reviewed at length with the patient today.  The patient does not have concerns regarding medicines.  The following changes have been made:   As above  Labs/ tests ordered today include: None   No orders of the defined types were placed in this encounter.    Disposition:   FU with me in  4 months.    Signed, Rollene Rotunda, MD  06/27/2021 4:48 PM    [facility]

## 2021-06-27 ENCOUNTER — Ambulatory Visit: Payer: Medicare Other | Admitting: Cardiology

## 2021-06-27 ENCOUNTER — Encounter: Payer: Self-pay | Admitting: Cardiology

## 2021-06-27 ENCOUNTER — Other Ambulatory Visit: Payer: Self-pay

## 2021-06-27 VITALS — BP 126/58 | HR 88 | Ht 62.0 in | Wt 151.8 lb

## 2021-06-27 DIAGNOSIS — R42 Dizziness and giddiness: Secondary | ICD-10-CM | POA: Diagnosis not present

## 2021-06-27 DIAGNOSIS — I10 Essential (primary) hypertension: Secondary | ICD-10-CM

## 2021-06-27 DIAGNOSIS — E785 Hyperlipidemia, unspecified: Secondary | ICD-10-CM

## 2021-06-27 NOTE — Patient Instructions (Signed)
Medication Instructions:   STOP LOSARTAN   *If you need a refill on your cardiac medications before your next appointment, please call your pharmacy*   Follow-Up: At [facility], you and your health needs are our priority.  As part of our continuing mission to provide you with exceptional heart care, we have created designated Provider Care Teams.  These Care Teams include your primary Cardiologist (physician) and Advanced Practice Providers (APPs -  Physician Assistants and Nurse Practitioners) who all work together to provide you with the care you need, when you need it.  We recommend signing up for the patient portal called "MyChart".  Sign up information is provided on this After Visit Summary.  MyChart is used to connect with patients for Virtual Visits (Telemedicine).  Patients are able to view lab/test results, encounter notes, upcoming appointments, etc.  Non-urgent messages can be sent to your provider as well.   To learn more about what you can do with MyChart, go to [email or web address].    Your next appointment:   4 month(s)  The format for your next appointment:   In Person  Provider:   [name], MD

## 2021-06-28 NOTE — Addendum Note (Signed)
Addended by: Wonda Horner on: 06/28/2021 03:07 PM   Modules accepted: Orders

## 2021-07-05 ENCOUNTER — Ambulatory Visit (HOSPITAL_BASED_OUTPATIENT_CLINIC_OR_DEPARTMENT_OTHER): Payer: Medicare Other | Admitting: Physical Therapy

## 2021-07-05 ENCOUNTER — Telehealth: Payer: Medicare HMO

## 2021-07-05 NOTE — Progress Notes (Deleted)
Chronic Care Management Pharmacy Note  07/05/2021 Name:  Tamara Robertson MRN:  [id number] DOB:  1929-10-13  Summary: ***  Recommendations/Changes made from today's visit: ***  Plan: ***  Subjective: Tamara Robertson is an 88 y.o. year old female who is a primary patient of Burns, Bobette Mo, MD.  The CCM team was consulted for assistance with disease management and care coordination needs.    Engaged with patient by telephone for follow up visit in response to provider referral for pharmacy case management and/or care coordination services.   Consent to Services:  The patient was given the following information about Chronic Care Management services today, agreed to services, and gave verbal consent: 1. CCM service includes personalized support from designated clinical staff supervised by the primary care provider, including individualized plan of care and coordination with other care providers 2. 24/7 contact phone numbers for assistance for urgent and routine care needs. 3. Service will only be billed when office clinical staff spend 20 minutes or more in a month to coordinate care. 4. Only one practitioner may furnish and bill the service in a calendar month. 5.The patient may stop CCM services at any time (effective at the end of the month) by phone call to the office staff. 6. The patient will be responsible for cost sharing (co-pay) of up to 20% of the service fee (after annual deductible is met). Patient agreed to services and consent obtained.  Patient Care Team: Pincus Sanes, MD as PCP - General (Internal Medicine) Rollene Rotunda, MD as PCP - Cardiology (Cardiology) Lunette Stands, MD as Consulting Physician (Orthopedic Surgery) Rollene Rotunda, MD as Consulting Physician (Cardiology) Venancio Poisson, MD as Consulting Physician (Dermatology) Benjiman Core, MD (Hematology and Oncology) Cleda Mccreedy, MD (Gynecologic Oncology) Carlus Pavlov, MD (Endocrinology) Pyrtle, Carie Caddy, MD as  Consulting Physician (Gastroenterology) Kathyrn Sheriff, [facility] as Pharmacist (Pharmacist)  Recent office visits: 05/17/2021 - Dr. Lawerance Bach - melena, COVID, leg edema - 3 day black stools - restart famotidine - referral to GI 04/18/2021 - Dr. Lawerance Bach - lasix increase to 40mg  x 1-2 days then back to 20mg  daily  01/20/2021 - Dr. Lawerance Bach - BP elevated - start losartan 25mg  daily   Recent consult visits: 06/27/2021 - Dr. Antoine Poche - Cardiology  - f/u for dizziness  - stopped losartan due to hypotension - remains off of eliquis due to rash and guaiac positive stools - f/u in 4 months  06/12/2021 - Dr. Eloy End - Podiatry - f/u in 9 weeks  05/23/2021 - Dr. Lenell Antu - Vascular Surgery - no changes to medications  04/11/2021 - Dr. Lenell Antu - Vascular Surgery - mild RLE medial calf inflammation in setting of mixed venous and arterial insufficiency - trial cortisone cream - consider angiogram if no improvement  04/04/2021 - Dr. Antoine Poche - Cardiology - edema -increase furosemide x 2 days - f/u in 3 months  03/27/2021 - Dr. Seymour Bars - OB/GYN - no changes to medications 03/14/2021 - Dr. Lenell Antu - Vascular Surgery - cellulitis - rx'd keflex and recommended calamine lotion   03/08/2021 - Dr. Terrial Rhodes - Neurology - pain in right foot - lidocaine 5% gel rx'd - follow up prn  02/09/2021 - Dr. Antoine Poche - Cardiology - stopped eliquis due to rash - unable to take ASA due to thrombocytopenia  01/13/2021 - Marjie Skiff - Cardiology - f/u for dizziness - hold losartan   Hospital visits: 01/05/2021-01/06/2021 [facility] admission - found to be in Afib with RVR started eliquis on discharge  Objective:  Lab Results  Component Value Date   CREATININE 0.94 05/22/2021   BUN 17 05/22/2021   GFR 53.72 (L) 05/22/2021   GFRNONAA >60 01/06/2021   GFRAA 74 01/20/2020   NA 136 05/22/2021   K 4.5 05/22/2021   CALCIUM 9.3 05/22/2021   CO2 27 05/22/2021   GLUCOSE 121 (H) 05/22/2021    Lab Results  Component Value Date/Time   HGBA1C 5.8  12/14/2020 04:16 PM   HGBA1C 5.9 07/29/2020 03:46 PM   GFR 53.72 (L) 05/22/2021 03:44 PM   GFR 55.16 (L) 04/24/2021 03:17 PM    Last diabetic Eye exam:  No results found for: HMDIABEYEEXA  Last diabetic Foot exam:  No results found for: HMDIABFOOTEX   Lab Results  Component Value Date   CHOL 190 12/14/2020   HDL 46.00 12/14/2020   LDLCALC 107 (H) 05/23/2017   LDLDIRECT 118.0 12/14/2020   TRIG 262.0 (H) 12/14/2020   CHOLHDL 4 12/14/2020    Hepatic Function Latest Ref Rng & Units 05/22/2021 12/14/2020 07/29/2020  Total Protein 6.0 - 8.3 g/dL 7.2 7.1 7.6  Albumin 3.5 - 5.2 g/dL 4.1 4.1 4.5  AST 0 - 37 U/L 19 20 19   ALT 0 - 35 U/L 16 17 13   Alk Phosphatase 39 - 117 U/L 67 63 78  Total Bilirubin 0.2 - 1.2 mg/dL 0.3 0.4 0.3  Bilirubin, Direct 0.0 - 0.3 mg/dL - - -    Lab Results  Component Value Date/Time   TSH 1.984 01/06/2021 12:05 PM   TSH 2.23 02/11/2019 04:26 PM   TSH 2.05 07/23/2017 01:58 PM    CBC Latest Ref Rng & Units 05/22/2021 02/24/2021 01/13/2021  WBC 4.0 - 10.5 K/uL 14.0(H) 12.6(H) 12.6(H)  Hemoglobin 12.0 - 15.0 g/dL 11.2(L) 12.9 13.9  Hematocrit 36.0 - 46.0 % 34.2(L) 38.8 41.7  Platelets 150.0 - 400.0 K/uL 284.0 157 160    Lab Results  Component Value Date/Time   VD25OH 51.41 07/29/2020 03:46 PM   VD25OH 44 07/08/2014 12:31 PM   VD25OH 61 07/06/2013 12:02 PM    Clinical ASCVD: No  The ASCVD Risk score (Arnett DK, et al., 2019) failed to calculate for the following reasons:   The 2019 ASCVD risk score is only valid for ages 41 to 6    Depression screen PHQ 2/9 08/05/2020 04/05/2020 02/24/2019  Decreased Interest 0 0 0  Down, Depressed, Hopeless 0 0 1  PHQ - 2 Score 0 0 1  Altered sleeping - - 1  Tired, decreased energy - - 1  Change in appetite - - 0  Feeling bad or failure about yourself  - - 0  Trouble concentrating - - 0  Moving slowly or fidgety/restless - - 0  Suicidal thoughts - - 0  PHQ-9 Score - - 3  Difficult doing work/chores - - Not  difficult at all  Some recent data might be hidden    Social History   Tobacco Use  Smoking Status Never  Smokeless Tobacco Never   BP Readings from Last 3 Encounters:  06/27/21 (!) 126/58  05/23/21 121/64  04/18/21 140/78   Pulse Readings from Last 3 Encounters:  06/27/21 88  05/23/21 75  04/18/21 82   Wt Readings from Last 3 Encounters:  06/27/21 151 lb 12.8 oz (68.9 kg)  05/23/21 151 lb 11.2 oz (68.8 kg)  04/18/21 156 lb 3.2 oz (70.9 kg)   BMI Readings from Last 3 Encounters:  06/27/21 27.76 kg/m  05/23/21 28.66 kg/m  04/18/21 29.51 kg/m  Assessment/Interventions: Review of patient past medical history, allergies, medications, health status, including review of consultants reports, laboratory and other test data, was performed as part of comprehensive evaluation and provision of chronic care management services.   SDOH:  (Social Determinants of Health) assessments and interventions performed: Yes  SDOH Screenings   Alcohol Screen: Low Risk    Last Alcohol Screening Score (AUDIT): 0  Depression (PHQ2-9): Low Risk    PHQ-2 Score: 0  Financial Resource Strain: Low Risk    Difficulty of Paying Living Expenses: Not hard at all  Food Insecurity: No Food Insecurity   Worried About Programme researcher, broadcasting/film/video in the Last Year: Never true   Ran Out of Food in the Last Year: Never true  Housing: Low Risk    Last Housing Risk Score: 0  Physical Activity: Inactive   Days of Exercise per Week: 0 days   Minutes of Exercise per Session: 0 min  Social Connections: Moderately Integrated   Frequency of Communication with Friends and Family: More than three times a week   Frequency of Social Gatherings with Friends and Family: Once a week   Attends Religious Services: More than 4 times per year   Active Member of [employer] or Organizations: Yes   Attends Banker Meetings: More than 4 times per year   Marital Status: Widowed  Stress: No Stress Concern Present   Feeling  of Stress : Not at all  Tobacco Use: Low Risk    Smoking Tobacco Use: Never   Smokeless Tobacco Use: Never   Passive Exposure: Not on file  Transportation Needs: No Transportation Needs   Lack of Transportation (Medical): No   Lack of Transportation (Non-Medical): No    CCM Care Plan  Allergies  Allergen Reactions   Aspirin Other (See Comments)    ITP   Sulfa Antibiotics Other (See Comments)    dizziness   Contrast Media [Iodinated Contrast Media] Hives    CAT scan contrast only   Shellfish-Derived Products Hives    Other reaction(s): Other   Eliquis [Apixaban]     Rash    Fluoxetine     Did not feel well on it    Sulfamethoxazole-Trimethoprim     Other reaction(s): Unknown   Celebrex [Celecoxib] Other (See Comments)    Leg swelling   Celexa [Citalopram] Other (See Comments)    Felt out of it   Statins Other (See Comments)    Medications Reviewed Today     Reviewed by Rollene Rotunda, MD (Physician) on 06/27/21 at 1648  Med List Status: <None>   Medication Order Taking? Sig Documenting Provider Last Dose Status Informant  acetaminophen (TYLENOL) 500 MG tablet [id number] Yes Take 500 mg by mouth every 6 (six) hours as needed. [provider] Taking Active Self  Biotin 2500 MCG CAPS [id number] Yes Take 1 capsule by mouth daily.  [provider] Taking Active Self  calcium carbonate (TUMS - DOSED IN MG ELEMENTAL CALCIUM) 500 MG chewable tablet [id number] Yes Chew 1 tablet by mouth daily as needed for indigestion or heartburn. [provider] Taking Active Self  cycloSPORINE (RESTASIS) 0.05 % ophthalmic emulsion [id number] Yes 1 drop 2 (two) times daily. [provider] Taking Active Self  escitalopram (LEXAPRO) 5 MG tablet [id number] Yes Take 1 tablet (5 mg total) by mouth daily. Pincus Sanes, MD Taking Active   famotidine (PEPCID) 20 MG tablet [id number] Yes Take 1 tablet (20 mg total) by mouth 2 (two) times daily as needed  for heartburn or  indigestion. Pincus Sanes, MD Taking Active   Flaxseed MISC [id number] Yes Take 5 mLs by mouth daily. Take 1 tsp daily [provider] Taking Active Self  furosemide (LASIX) 20 MG tablet [id number] Yes Take 40 mg (two tablets) for two days; then take 20 mg (one tablet) each day. Rollene Rotunda, MD Taking Active   Glucos-Chond-Hyal Ac-Ca Fructo (MOVE FREE JOINT HEALTH ADVANCE PO) [id number] Yes Take by mouth. [provider] Taking Active Self  lidocaine (LMX) 4 % cream [id number] Yes Apply 1 application topically as needed. [provider] Taking Active   Magnesium Oxide 250 MG TABS [id number] Yes Take 1 tablet by mouth. [provider] Taking Active Self    Discontinued 01/13/21 1205   metoprolol succinate (TOPROL-XL) 25 MG 24 hr tablet [id number] Yes TAKE 1/2 (ONE-HALF) TABLET BY MOUTH ONCE DAILY WITH OR IMMEDIATELY FOLLOWING A MEAL Hochrein, James, MD Taking Active   Multiple Vitamin (MULTIVITAMIN) tablet [id number] Yes Take 1 tablet by mouth daily.   [provider] Taking Active Self  polyethylene glycol (MIRALAX / GLYCOLAX) packet [id number] Yes Take 17 g by mouth daily as needed for moderate constipation.  [provider] Taking Active Self  predniSONE (DELTASONE) 2.5 MG tablet [id number] Yes Take 1 tablet (2.5 mg total) by mouth every other day. Pincus Sanes, MD Taking Active   Probiotic Product (PROBIOTIC DAILY PO) [id number] Yes Take 1 tablet by mouth daily with breakfast. [provider] Taking Active Self  promethazine-dextromethorphan (PROMETHAZINE-DM) 6.25-15 MG/5ML syrup [id number] Yes Take 5 mLs by mouth 4 (four) times daily as needed for cough. Etta Grandchild, MD Taking Active   psyllium (METAMUCIL) 58.6 % packet [id number] Yes Take 1 packet by mouth daily as needed. [provider] Taking Active Self  PSYLLIUM PO [id number] Yes Take 1 tablet by mouth every other day. [provider] Taking Active Self   triamcinolone cream (KENALOG) 0.1 % [id number] Yes SMARTSIG:2 Topical Twice Daily [provider] Taking Active   trolamine salicylate (ASPERCREME) 10 % cream [id number] Yes Apply 1 application topically as needed for muscle pain. [provider] Taking Active Self            Patient Active Problem List   Diagnosis Date Noted   Melena 05/17/2021   COVID 05/17/2021   Acute bronchitis due to COVID-19 virus 05/03/2021   Pain in joint of right shoulder 02/14/2021   Insertional Achilles tendinopathy 02/14/2021   Posterior calcaneal exostosis 02/14/2021   Right Achilles bursitis 01/20/2021   Paroxysmal A-fib (HCC) 01/05/2021   Aortic atherosclerosis (HCC) 12/14/2020   Spinal stenosis of lumbar region with neurogenic claudication 12/14/2020   Melanocytic nevi of trunk 10/05/2020   Peripheral venous insufficiency 10/05/2020   Rosacea 10/05/2020   Inflamed seborrheic keratosis 10/05/2020   Temporal arteritis (HCC) 10/05/2020   Viral warts 10/05/2020   Ear pain, left 06/23/2020   Posterior neck pain 08/03/2019   Dizziness 08/03/2019   Pain of left hip joint 07/24/2019   Diarrhea 03/13/2019   Nonrheumatic aortic valve stenosis 02/26/2019   Sensorineural hearing loss (SNHL), bilateral 01/30/2019   Chronic ITP (idiopathic thrombocytopenia) (HCC) 12/01/2018   Lightheadedness 11/19/2018   Epistaxis 11/19/2018   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   Rash 03/25/2018   Chronic left-sided headaches 01/28/2018   SBO (small bowel obstruction) (HCC) 09/20/2017   Aortic stenosis, moderate 08/22/2017   CAD (coronary artery disease) 08/21/2017   Community acquired pneumonia 06/25/2017   Current chronic use of systemic steroids 06/25/2017  Varicose veins of left lower extremity with complications 03/05/2017   Hypertension 07/31/2016   Leg edema 07/31/2016   Prediabetes 06/05/2016   Osteopenia 12/01/2015   Anxiety and depression 07/19/2015   Degenerative cervical disc  12/28/2014   Left ovarian cyst    Peripheral neuropathy 06/03/2012   PVD (peripheral vascular disease) (HCC) 11/23/2010   Bursitis of hip 10/26/2010   Hyperlipidemia 06/27/2010   Unspecified glaucoma 06/27/2010   GERD 06/27/2010   Irritable bowel syndrome 06/27/2010   OVERACTIVE BLADDER 06/27/2010   OSTEOARTHRITIS, KNEE, RIGHT 06/27/2010   URINARY INCONTINENCE 06/27/2010    Immunization History  Administered Date(s) Administered   Fluad Quad(high Dose 65+) 12/31/2018, 01/20/2020, 02/22/2021   Influenza Split 02/04/2014, 02/14/2015   Influenza,inj,quad, With Preservative 02/05/2019   Influenza-Unspecified 02/12/2013, 02/06/2016, 02/04/2017, 01/10/2018   Moderna Sars-Covid-2 Vaccination 07/22/2020   PFIZER(Purple Top)SARS-COV-2 Vaccination 06/13/2019, 07/08/2019, 02/17/2020   Pneumococcal Conjugate-13 08/24/2014   Pneumococcal Polysaccharide-23 02/28/2011   Td 02/28/2011   Unspecified SARS-COV-2 Vaccination 06/17/2019, 07/08/2019   Zoster Recombinat (Shingrix) 05/23/2017, 10/25/2017   Zoster, Live 04/07/2007    Conditions to be addressed/monitored:  {USCCMDZASSESSMENTOPTIONS:23563}  There are no care plans that you recently modified to display for this patient.     Medication Assistance: {MEDASSISTANCEINFO:25044}  Compliance/Adherence/Medication fill history: Care Gaps: ***  Patient's preferred pharmacy is:  [facility] [address] [place] - [address] [place] [address] Phone: [phone] Fax: [phone]  [id number] - [place] - [address] [place] [address] Phone: [phone] Fax: [phone]  [facility] [address] [place] - [address] [place] [address] Phone: [phone] Fax: [phone]   Uses pill box? {Yes or If no, why not?:20788} Pt endorses ***% compliance  Care Plan and Follow Up Patient Decision:   {FOLLOWUP:24991}  Plan: {CM FOLLOW UP XNAT:55732}  ***  Current Barriers:  {pharmacybarriers:24917}  Pharmacist Clinical Goal(s):  Patient will {PHARMACYGOALCHOICES:24921} through collaboration with PharmD and provider.   Interventions: 1:1 collaboration with Pincus Sanes, MD regarding development and update of comprehensive plan of care as evidenced by provider attestation and co-signature Inter-disciplinary care team collaboration (see longitudinal plan of care) Comprehensive medication review performed; medication list updated in electronic medical record  Hypertension (BP goal <130/80) -Controlled -Current treatment: Metoprolol Succinate 25mg  - 1/2 tablet daily  Furosemide 20mg  daily  -Medications previously tried: hctz, losartan,   -Current home readings: *** BP Readings from Last 3 Encounters:  06/27/21 (!) 126/58  05/23/21 121/64  04/18/21 140/78  -Current dietary habits: *** -Current exercise habits: *** -{ACTIONS;DENIES/REPORTS:21021675::"Denies"} hypotensive/hypertensive symptoms -Educated on {CCM BP Counseling:25124} -Counseled to monitor BP at home ***, document, and provide log at future appointments -{CCMPHARMDINTERVENTION:25122}  Hyperlipidemia: (LDL goal < 70) -Not ideally controlled - patient noted intolerance to statin medications  Lab Results  Component Value Date   LDLCALC 107 (H) 05/23/2017  -Current treatment: N/a -diet controlled  -Medications previously tried: pravastatin - statin intolerant   -Current dietary patterns: *** -Current exercise habits: *** -Educated on {CCM HLD Counseling:25126} -{CCMPHARMDINTERVENTION:25122}  Atrial Fibrillation (Goal: prevent stroke and major bleeding) -Controlled -CHADS2VASc score: Age (2 points), Female sex (1 point), Hypertension history (1 point), and Vascular Disease history (prior MI, peripheral artery disease, or aortic plaque) (1 point) -Current treatment: Rate control: metoprolol succinate 12.5mg   daily  Anticoagulation: n/a - eliquis caused rash / melena - unable to take ASA due to thrombocytopenia  -Medications previously tried: eliquis -Home  BP and HR readings: ***  -Counseled on {CCMAFIBCOUNSELING:25120} -{CCMPHARMDINTERVENTION:25122}  Depression (Goal: Promotion of positive mood) -Controlled -Current treatment  Escitalopram 5mg  daily  -Medications previously tried: citalopram, alprazolam, fluoxetine   -{CCMPHARMDINTERVENTION:25122}  Health Maintenance -Vaccine gaps: n/a -Current therapy:  Psyllium - 1 tablet every other day Aspercreme 10% - applied prn  Magnesium Oxide 250mg  daily  Tums - prn  APAP 500mg  q6h prn  Probiotic daily  Move Free  Biotin daily  Miralax 17g daily prn  Flaxseed daily  Multivitamin daily  Famotidine 20mg  BID  -Educated on {ccm supplement counseling:25128} -{CCM Patient satisfied:25129} -{CCMPHARMDINTERVENTION:25122}  Patient Goals/Self-Care Activities Patient will:  - {pharmacypatientgoals:24919}  Follow Up Plan: {CM FOLLOW UP ZOXW:96045}

## 2021-07-10 ENCOUNTER — Institutional Professional Consult (permissible substitution): Payer: Medicare Other | Admitting: Pulmonary Disease

## 2021-07-10 ENCOUNTER — Ambulatory Visit (INDEPENDENT_AMBULATORY_CARE_PROVIDER_SITE_OTHER): Payer: Medicare Other

## 2021-07-10 DIAGNOSIS — I480 Paroxysmal atrial fibrillation: Secondary | ICD-10-CM

## 2021-07-10 DIAGNOSIS — I10 Essential (primary) hypertension: Secondary | ICD-10-CM

## 2021-07-10 DIAGNOSIS — F419 Anxiety disorder, unspecified: Secondary | ICD-10-CM

## 2021-07-10 DIAGNOSIS — F32A Depression, unspecified: Secondary | ICD-10-CM

## 2021-07-10 NOTE — Patient Instructions (Signed)
Visit Information ? ?Following are the goals we discussed today:  ? ?Manage My Medicine  ? ?Timeframe:  Long-Range Goal ?Priority:  High ?Start Date:    07/10/2021                         ?Expected End Date: 07/11/2022                     ? ?Follow Up Date Sept 2023 ?  ?- call for medicine refill 2 or 3 days before it runs out ?- call if I am sick and can't take my medicine ?- keep a list of all the medicines I take; vitamins and herbals too ?- learn to read medicine labels ?- use a pillbox to sort medicine  ?  ?Why is this important?   ?These steps will help you keep on track with your medicines. ? ?Plan: Telephone follow up appointment with care management team member scheduled for:  6 months  ?The patient has been provided with contact information for the care management team and has been advised to call with any health related questions or concerns.  ? ?Tomasa Blase, PharmD ?Clinical Pharmacist, [place]  ? ?Please call the care guide team at [phone] if you need to cancel or reschedule your appointment.  ? ?Patient verbalizes understanding of instructions and care plan provided today and agrees to view in [facility]. Active MyChart status confirmed with patient.   ? ?

## 2021-07-10 NOTE — Progress Notes (Signed)
Chronic Care Management Pharmacy Note  07/10/2021 Name:  Tamara Robertson MRN:  [id number] DOB:  12-Apr-1930  Summary: -Patient reports that she has been doing well recently, adherent to current medications  -Patient reduced escitalopram to 2.5mg  daily - feels that depression is well controlled on this dose -Denies any issues or dizziness since stopping losartan with last cardiology visit  -Is not currently monitoring BP at home as she was made aware that her home BP cuff reads 20 points high  -Denies any issues or concerns with current medications   Recommendations/Changes made from today's visit: -Recommending no changes to medications, patient to continue to monitor for signs or symptoms of bleeding, will reach out to office with any issues   Plan: -F/u in 6 months  Subjective: Tamara Robertson is an 88 y.o. year old female who is a primary patient of Burns, Bobette Mo, MD.  The CCM team was consulted for assistance with disease management and care coordination needs.    Engaged with patient by telephone for follow up visit in response to provider referral for pharmacy case management and/or care coordination services.   Consent to Services:  The patient was given the following information about Chronic Care Management services today, agreed to services, and gave verbal consent: 1. CCM service includes personalized support from designated clinical staff supervised by the primary care provider, including individualized plan of care and coordination with other care providers 2. 24/7 contact phone numbers for assistance for urgent and routine care needs. 3. Service will only be billed when office clinical staff spend 20 minutes or more in a month to coordinate care. 4. Only one practitioner may furnish and bill the service in a calendar month. 5.The patient may stop CCM services at any time (effective at the end of the month) by phone call to the office staff. 6. The patient will be responsible for cost  sharing (co-pay) of up to 20% of the service fee (after annual deductible is met). Patient agreed to services and consent obtained.  Patient Care Team: Pincus Sanes, MD as PCP - General (Internal Medicine) Rollene Rotunda, MD as PCP - Cardiology (Cardiology) Lunette Stands, MD as Consulting Physician (Orthopedic Surgery) Rollene Rotunda, MD as Consulting Physician (Cardiology) Venancio Poisson, MD as Consulting Physician (Dermatology) Benjiman Core, MD (Hematology and Oncology) Cleda Mccreedy, MD (Gynecologic Oncology) Carlus Pavlov, MD (Endocrinology) Pyrtle, Carie Caddy, MD as Consulting Physician (Gastroenterology) Ellin Saba, [facility] (Pharmacist)  Recent office visits: 05/17/2021 - Dr. Lawerance Bach - melena, COVID, leg edema - 3 day black stools - restart famotidine - referral to GI 04/18/2021 - Dr. Lawerance Bach - lasix increase to 40mg  x 1-2 days then back to 20mg  daily  01/20/2021 - Dr. Lawerance Bach - BP elevated - start losartan 25mg  daily   Recent consult visits: 06/27/2021 - Dr. Antoine Poche - Cardiology  - f/u for dizziness  - stopped losartan due to hypotension - remains off of eliquis due to rash and guaiac positive stools - f/u in 4 months  06/12/2021 - Dr. Eloy End - Podiatry - f/u in 9 weeks  05/23/2021 - Dr. Lenell Antu - Vascular Surgery - no changes to medications  04/11/2021 - Dr. Lenell Antu - Vascular Surgery - mild RLE medial calf inflammation in setting of mixed venous and arterial insufficiency - trial cortisone cream - consider angiogram if no improvement  04/04/2021 - Dr. Antoine Poche - Cardiology - edema -increase furosemide x 2 days - f/u in 3 months  03/27/2021 - Dr. Seymour Bars - OB/GYN -  no changes to medications 03/14/2021 - Dr. Lenell Antu - Vascular Surgery - cellulitis - rx'd keflex and recommended calamine lotion   03/08/2021 - Dr. Terrial Rhodes - Neurology - pain in right foot - lidocaine 5% gel rx'd - follow up prn  02/09/2021 - Dr. Antoine Poche - Cardiology - stopped eliquis due to rash - unable to take ASA due to  thrombocytopenia  01/13/2021 - Marjie Skiff - Cardiology - f/u for dizziness - hold losartan   Hospital visits: 01/05/2021-01/06/2021 [facility] admission - found to be in Afib with RVR started eliquis on discharge   Objective:  Lab Results  Component Value Date   CREATININE 0.94 05/22/2021   BUN 17 05/22/2021   GFR 53.72 (L) 05/22/2021   GFRNONAA >60 01/06/2021   GFRAA 74 01/20/2020   NA 136 05/22/2021   K 4.5 05/22/2021   CALCIUM 9.3 05/22/2021   CO2 27 05/22/2021   GLUCOSE 121 (H) 05/22/2021    Lab Results  Component Value Date/Time   HGBA1C 5.8 12/14/2020 04:16 PM   HGBA1C 5.9 07/29/2020 03:46 PM   GFR 53.72 (L) 05/22/2021 03:44 PM   GFR 55.16 (L) 04/24/2021 03:17 PM    Last diabetic Eye exam:  No results found for: HMDIABEYEEXA  Last diabetic Foot exam:  No results found for: HMDIABFOOTEX   Lab Results  Component Value Date   CHOL 190 12/14/2020   HDL 46.00 12/14/2020   LDLCALC 107 (H) 05/23/2017   LDLDIRECT 118.0 12/14/2020   TRIG 262.0 (H) 12/14/2020   CHOLHDL 4 12/14/2020    Hepatic Function Latest Ref Rng & Units 05/22/2021 12/14/2020 07/29/2020  Total Protein 6.0 - 8.3 g/dL 7.2 7.1 7.6  Albumin 3.5 - 5.2 g/dL 4.1 4.1 4.5  AST 0 - 37 U/L 19 20 19   ALT 0 - 35 U/L 16 17 13   Alk Phosphatase 39 - 117 U/L 67 63 78  Total Bilirubin 0.2 - 1.2 mg/dL 0.3 0.4 0.3  Bilirubin, Direct 0.0 - 0.3 mg/dL - - -    Lab Results  Component Value Date/Time   TSH 1.984 01/06/2021 12:05 PM   TSH 2.23 02/11/2019 04:26 PM   TSH 2.05 07/23/2017 01:58 PM    CBC Latest Ref Rng & Units 05/22/2021 02/24/2021 01/13/2021  WBC 4.0 - 10.5 K/uL 14.0(H) 12.6(H) 12.6(H)  Hemoglobin 12.0 - 15.0 g/dL 11.2(L) 12.9 13.9  Hematocrit 36.0 - 46.0 % 34.2(L) 38.8 41.7  Platelets 150.0 - 400.0 K/uL 284.0 157 160    Lab Results  Component Value Date/Time   VD25OH 51.41 07/29/2020 03:46 PM   VD25OH 44 07/08/2014 12:31 PM   VD25OH 61 07/06/2013 12:02 PM    Clinical ASCVD: No  The ASCVD Risk  score (Arnett DK, et al., 2019) failed to calculate for the following reasons:   The 2019 ASCVD risk score is only valid for ages 86 to 44    Depression screen PHQ 2/9 08/05/2020 04/05/2020 02/24/2019  Decreased Interest 0 0 0  Down, Depressed, Hopeless 0 0 1  PHQ - 2 Score 0 0 1  Altered sleeping - - 1  Tired, decreased energy - - 1  Change in appetite - - 0  Feeling bad or failure about yourself  - - 0  Trouble concentrating - - 0  Moving slowly or fidgety/restless - - 0  Suicidal thoughts - - 0  PHQ-9 Score - - 3  Difficult doing work/chores - - Not difficult at all  Some recent data might be hidden    Social History  Tobacco Use  Smoking Status Never  Smokeless Tobacco Never   BP Readings from Last 3 Encounters:  06/27/21 (!) 126/58  05/23/21 121/64  04/18/21 140/78   Pulse Readings from Last 3 Encounters:  06/27/21 88  05/23/21 75  04/18/21 82   Wt Readings from Last 3 Encounters:  06/27/21 151 lb 12.8 oz (68.9 kg)  05/23/21 151 lb 11.2 oz (68.8 kg)  04/18/21 156 lb 3.2 oz (70.9 kg)   BMI Readings from Last 3 Encounters:  06/27/21 27.76 kg/m  05/23/21 28.66 kg/m  04/18/21 29.51 kg/m    Assessment/Interventions: Review of patient past medical history, allergies, medications, health status, including review of consultants reports, laboratory and other test data, was performed as part of comprehensive evaluation and provision of chronic care management services.   SDOH:  (Social Determinants of Health) assessments and interventions performed: Yes  SDOH Screenings   Alcohol Screen: Low Risk    Last Alcohol Screening Score (AUDIT): 0  Depression (PHQ2-9): Low Risk    PHQ-2 Score: 0  Financial Resource Strain: Low Risk    Difficulty of Paying Living Expenses: Not hard at all  Food Insecurity: No Food Insecurity   Worried About Programme researcher, broadcasting/film/video in the Last Year: Never true   Ran Out of Food in the Last Year: Never true  Housing: Low Risk    Last  Housing Risk Score: 0  Physical Activity: Inactive   Days of Exercise per Week: 0 days   Minutes of Exercise per Session: 0 min  Social Connections: Moderately Integrated   Frequency of Communication with Friends and Family: More than three times a week   Frequency of Social Gatherings with Friends and Family: Once a week   Attends Religious Services: More than 4 times per year   Active Member of [employer] or Organizations: Yes   Attends Banker Meetings: More than 4 times per year   Marital Status: Widowed  Stress: No Stress Concern Present   Feeling of Stress : Not at all  Tobacco Use: Low Risk    Smoking Tobacco Use: Never   Smokeless Tobacco Use: Never   Passive Exposure: Not on file  Transportation Needs: No Transportation Needs   Lack of Transportation (Medical): No   Lack of Transportation (Non-Medical): No    CCM Care Plan  Allergies  Allergen Reactions   Aspirin Other (See Comments)    ITP   Sulfa Antibiotics Other (See Comments)    dizziness   Contrast Media [Iodinated Contrast Media] Hives    CAT scan contrast only   Shellfish-Derived Products Hives    Other reaction(s): Other   Eliquis [Apixaban]     Rash    Fluoxetine     Did not feel well on it    Sulfamethoxazole-Trimethoprim     Other reaction(s): Unknown   Celebrex [Celecoxib] Other (See Comments)    Leg swelling   Celexa [Citalopram] Other (See Comments)    Felt out of it   Statins Other (See Comments)    Medications Reviewed Today     Reviewed by Ellin Saba, [facility] (Pharmacist) on 07/10/21 at 1108  Med List Status: <None>   Medication Order Taking? Sig Documenting Provider Last Dose Status Informant  acetaminophen (TYLENOL) 500 MG tablet [id number] Yes Take 500 mg by mouth every 6 (six) hours as needed. [provider] Taking Active Self  Biotin 2500 MCG CAPS [id number] Yes Take 1 capsule by mouth daily.  [provider] Taking Active Self  calcium carbonate (TUMS -  DOSED IN MG ELEMENTAL CALCIUM) 500 MG chewable tablet [id number] Yes Chew 1 tablet by mouth daily as needed for indigestion or heartburn. [provider] Taking Active Self  cholecalciferol (VITAMIN D3) 25 MCG (1000 UNIT) tablet [id number] Yes Take 1,000 Units by mouth daily. [provider] Taking Active   cycloSPORINE (RESTASIS) 0.05 % ophthalmic emulsion [id number] Yes 1 drop 2 (two) times daily. [provider] Taking Active Self  escitalopram (LEXAPRO) 5 MG tablet [id number] Yes Take 1 tablet (5 mg total) by mouth daily.  Patient taking differently: Take 2.5 mg by mouth daily.   Pincus Sanes, MD Taking Active   famotidine (PEPCID) 20 MG tablet [id number] Yes Take 1 tablet (20 mg total) by mouth 2 (two) times daily as needed for heartburn or indigestion. Pincus Sanes, MD Taking Active   Flaxseed MISC [id number] Yes Take 5 mLs by mouth daily. Take 1 tsp daily [provider] Taking Active Self  furosemide (LASIX) 20 MG tablet [id number] Yes Take 40 mg (two tablets) for two days; then take 20 mg (one tablet) each day. Rollene Rotunda, MD Taking Active   Glucos-Chond-Hyal Ac-Ca Fructo (MOVE FREE JOINT HEALTH ADVANCE PO) [id number] Yes Take by mouth. [provider] Taking Active Self  lidocaine (LMX) 4 % cream [id number] Yes Apply 1 application topically as needed. [provider] Taking Active   Magnesium Oxide 250 MG TABS [id number] Yes Take 1 tablet by mouth. [provider] Taking Active Self    Discontinued 01/13/21 1205   metoprolol succinate (TOPROL-XL) 25 MG 24 hr tablet [id number] Yes TAKE 1/2 (ONE-HALF) TABLET BY MOUTH ONCE DAILY WITH OR IMMEDIATELY FOLLOWING A MEAL Hochrein, James, MD Taking Active   Multiple Vitamin (MULTIVITAMIN) tablet [id number] Yes Take 1 tablet by mouth daily.   [provider] Taking Active Self  polyethylene glycol (MIRALAX / GLYCOLAX) packet [id number] Yes Take 17 g by mouth daily as needed for moderate  constipation.  [provider] Taking Active Self  predniSONE (DELTASONE) 2.5 MG tablet [id number] Yes Take 1 tablet (2.5 mg total) by mouth every other day. Pincus Sanes, MD Taking Active   Probiotic Product (PROBIOTIC DAILY PO) [id number] Yes Take 1 tablet by mouth daily with breakfast. [provider] Taking Active Self  PSYLLIUM PO [id number] Yes Take 1 tablet by mouth every other day. [provider] Taking Active Self  triamcinolone cream (KENALOG) 0.1 % [id number] Yes SMARTSIG:2 Topical Twice Daily [provider] Taking Active   trolamine salicylate (ASPERCREME) 10 % cream [id number] Yes Apply 1 application topically as needed for muscle pain. [provider] Taking Active Self            Patient Active Problem List   Diagnosis Date Noted   Melena 05/17/2021   COVID 05/17/2021   Acute bronchitis due to COVID-19 virus 05/03/2021   Pain in joint of right shoulder 02/14/2021   Insertional Achilles tendinopathy 02/14/2021   Posterior calcaneal exostosis 02/14/2021   Right Achilles bursitis 01/20/2021   Paroxysmal A-fib (HCC) 01/05/2021   Aortic atherosclerosis (HCC) 12/14/2020   Spinal stenosis of lumbar region with neurogenic claudication 12/14/2020   Melanocytic nevi of trunk 10/05/2020   Peripheral venous insufficiency 10/05/2020   Rosacea 10/05/2020   Inflamed seborrheic keratosis 10/05/2020   Temporal arteritis (HCC) 10/05/2020   Viral warts 10/05/2020   Ear pain, left 06/23/2020   Posterior neck pain 08/03/2019   Dizziness 08/03/2019   Pain of left hip joint 07/24/2019  Diarrhea 03/13/2019   Nonrheumatic aortic valve stenosis 02/26/2019   Sensorineural hearing loss (SNHL), bilateral 01/30/2019   Chronic ITP (idiopathic thrombocytopenia) (HCC) 12/01/2018   Lightheadedness 11/19/2018   Epistaxis 11/19/2018   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   Rash 03/25/2018   Chronic left-sided headaches 01/28/2018   SBO (small bowel  obstruction) (HCC) 09/20/2017   Aortic stenosis, moderate 08/22/2017   CAD (coronary artery disease) 08/21/2017   Community acquired pneumonia 06/25/2017   Current chronic use of systemic steroids 06/25/2017   Varicose veins of left lower extremity with complications 03/05/2017   Hypertension 07/31/2016   Leg edema 07/31/2016   Prediabetes 06/05/2016   Osteopenia 12/01/2015   Anxiety and depression 07/19/2015   Degenerative cervical disc 12/28/2014   Left ovarian cyst    Peripheral neuropathy 06/03/2012   PVD (peripheral vascular disease) (HCC) 11/23/2010   Bursitis of hip 10/26/2010   Hyperlipidemia 06/27/2010   Unspecified glaucoma 06/27/2010   GERD 06/27/2010   Irritable bowel syndrome 06/27/2010   OVERACTIVE BLADDER 06/27/2010   OSTEOARTHRITIS, KNEE, RIGHT 06/27/2010   URINARY INCONTINENCE 06/27/2010    Immunization History  Administered Date(s) Administered   Fluad Quad(high Dose 65+) 12/31/2018, 01/20/2020, 02/22/2021   Influenza Split 02/04/2014, 02/14/2015   Influenza,inj,quad, With Preservative 02/05/2019   Influenza-Unspecified 02/12/2013, 02/06/2016, 02/04/2017, 01/10/2018   Moderna Sars-Covid-2 Vaccination 07/22/2020   PFIZER(Purple Top)SARS-COV-2 Vaccination 06/13/2019, 07/08/2019, 02/17/2020   Pneumococcal Conjugate-13 08/24/2014   Pneumococcal Polysaccharide-23 02/28/2011   Td 02/28/2011   Unspecified SARS-COV-2 Vaccination 06/17/2019, 07/08/2019   Zoster Recombinat (Shingrix) 05/23/2017, 10/25/2017   Zoster, Live 04/07/2007    Conditions to be addressed/monitored:  Hypertension, Hyperlipidemia, Atrial Fibrillation, and Depression  Care Plan : CCM Pharmacy Care Plan  Updates made by Ellin Saba, RPH since 07/10/2021 12:00 AM     Problem: HTN, HLD, Afib, Depression   Priority: High  Onset Date: 07/10/2021     Long-Range Goal: Disease Management   Start Date: 07/10/2021  Expected End Date: 07/11/2022  This Visit's Progress: On track  Priority: High   Note:   Current Barriers:  Unable to independently monitor therapeutic efficacy  Pharmacist Clinical Goal(s):  Patient will achieve adherence to monitoring guidelines and medication adherence to achieve therapeutic efficacy through collaboration with PharmD and provider.   Interventions: 1:1 collaboration with Pincus Sanes, MD regarding development and update of comprehensive plan of care as evidenced by provider attestation and co-signature Inter-disciplinary care team collaboration (see longitudinal plan of care) Comprehensive medication review performed; medication list updated in electronic medical record  Hypertension (BP goal <130/80) -Controlled -Current treatment: Metoprolol Succinate 25mg  - 1/2 tablet daily  Furosemide 20mg  daily  -Medications previously tried: hctz, losartan,   -Current home readings: not currently checking as she recently found her BP cuff reads 20 points higher  BP Readings from Last 3 Encounters:  06/27/21 (!) 126/58  05/23/21 121/64  04/18/21 140/78  -Current dietary habits: restricts sodium intake  -Current exercise habits: does not have any scheduled exercise/ activity at this time  -Denies hypotensive/hypertensive symptoms - since stopping losartan  -Educated on BP goals and benefits of medications for prevention of heart attack, stroke and kidney damage; Daily salt intake goal < 2300 mg; Exercise goal of 150 minutes per week; Importance of home blood pressure monitoring; -Counseled on diet and exercise extensively Recommended to continue current medication  Hyperlipidemia: (LDL goal < 70) -Not ideally controlled - noted intolerance to statin medications  Lab Results  Component Value Date   LDLCALC 107 (H) 05/23/2017  -  Current treatment: N/a -diet controlled  -Medications previously tried: pravastatin - statin intolerant   -Current dietary patterns: eats a heart healthy / low cholesterol diet  -Current exercise habits: does not have any  scheduled exercise/ activity at this time  -Counseled on diet and exercise extensively  Atrial Fibrillation (Goal: prevent stroke and major bleeding) -Controlled - at this time plan per cardiology is to remain off of DOAC due to previous melena  -CHADS2VASc score: Age (2 points), Female sex (1 point), Hypertension history (1 point), and Vascular Disease history (prior MI, peripheral artery disease, or aortic plaque) (1 point) -Current treatment: Rate control: metoprolol succinate 12.5mg  daily  Anticoagulation: n/a - eliquis caused rash / melena - unable to take ASA due to thrombocytopenia  -Medications previously tried: eliquis -Home BP and HR readings: not checking at home at this time due to her cuff reading 20 points high   -Counseled on diet and exercise extensively Recommended to continue current medication  Depression (Goal: Promotion of positive mood) -Controlled -Current treatment  Escitalopram 2.5mg  daily  -Medications previously tried: citalopram, alprazolam, fluoxetine   -Recommended to continue current medication  Health Maintenance -Vaccine gaps: n/a -Current therapy:  Psyllium - 1 tablet every other day Aspercreme 10% - applied prn  Vitamin D 1000 units daily  Magnesium Oxide 250mg  daily  Tums - prn  APAP 500mg  q6h prn  Probiotic daily  Move Free  Biotin daily  Miralax 17g daily prn  Flaxseed daily  Multivitamin daily  Famotidine 20mg  BID  -Educated on Herbal supplement research is limited and benefits usually cannot be proven Cost vs benefit of each product must be carefully weighed by individual consumer Supplements may interfere with prescription drugs -Patient is satisfied with current therapy and denies issues -Recommended to continue current medication  Patient Goals/Self-Care Activities Patient will:  - take medications as prescribed as evidenced by patient report and record review  Follow Up Plan: Telephone follow up appointment with care  management team member scheduled for: 6 months The patient has been provided with contact information for the care management team and has been advised to call with any health related questions or concerns.        Medication Assistance: None required.  Patient affirms current coverage meets needs.  Care Gaps: N/a  Patient's preferred pharmacy is:  [facility] [address], [place] - [address] [place] [address] Phone: [phone] Fax: [phone]  [id number] [place], [place] - [address] [place] [address] Phone: [phone] Fax: [phone]  [facility] [id number], [place] - [address] [place] [address] Phone: [phone] Fax: [phone]   Uses pill box? Yes Pt endorses 100% compliance  Care Plan and Follow Up Patient Decision:  Patient agrees to Care Plan and Follow-up.  Plan: Telephone follow up appointment with care management team member scheduled for:  6 months The patient has been provided with contact information for the care management team and has been advised to call with any health related questions or concerns.   Ellin Saba, PharmD Clinical Pharmacist,  [facility]

## 2021-07-11 ENCOUNTER — Ambulatory Visit: Payer: Medicare Other | Admitting: Physical Therapy

## 2021-07-11 NOTE — Therapy (Incomplete)
OUTPATIENT PHYSICAL THERAPY SHOULDER EVALUATION   Patient Name: Tamara Robertson MRN: [id number] DOB:11/03/1929, 88 y.o., female Today's Date: 07/11/2021    Past Medical History:  Diagnosis Date   Achilles tendonitis    Anxiety    CAD (coronary artery disease)    RCA 40% stenosis cath 01/2011   Diverticular stricture [facility]) 2006   [facility]   DIVERTICULITIS, HX OF    Diverticulosis    DYSLIPIDEMIA    Elevated LFTs    GERD    Glaucoma    Hepatic steatosis    HOH (hard of hearing)    Immune thrombocytopenic purpura (HCC)    chronic - baseline 80-100K, on pred   Irritable bowel syndrome    Left ovarian cyst dx 01/2013 CT   working with gyn, ?malignant - elevated tumor marker OVA1   OSTEOARTHRITIS, KNEE, RIGHT    OSTEOPENIA    OVERACTIVE BLADDER    Plantar fasciitis    Prolapse of female bladder, acquired 11/2017   Torn rotator cuff    right shoulder   URINARY INCONTINENCE    Past Surgical History:  Procedure Laterality Date   ABDOMINAL HYSTERECTOMY  1963   APPENDECTOMY  1956   CARDIAC CATHETERIZATION     CATARACT EXTRACTION, BILATERAL  10/2010   CHOLECYSTECTOMY N/A 09/16/2014   Procedure: LAPAROSCOPIC CHOLECYSTECTOMY ;  Surgeon: Emelia Loron, MD;  Location: [facility] OR;  Service: General;  Laterality: N/A;   KNEE ARTHROSCOPY Right    L pop PTA  10/2009   stent   LAPAROSCOPIC SIGMOID COLECTOMY  10/2005   SPLENECTOMY  1954   VARICOSE VEIN SURGERY Right 1962   Patient Active Problem List   Diagnosis Date Noted   Melena 05/17/2021   COVID 05/17/2021   Acute bronchitis due to COVID-19 virus 05/03/2021   Pain in joint of right shoulder 02/14/2021   Insertional Achilles tendinopathy 02/14/2021   Posterior calcaneal exostosis 02/14/2021   Right Achilles bursitis 01/20/2021   Paroxysmal A-fib (HCC) 01/05/2021   Aortic atherosclerosis (HCC) 12/14/2020   Spinal stenosis of lumbar region with neurogenic claudication 12/14/2020   Melanocytic nevi of trunk  10/05/2020   Peripheral venous insufficiency 10/05/2020   Rosacea 10/05/2020   Inflamed seborrheic keratosis 10/05/2020   Temporal arteritis (HCC) 10/05/2020   Viral warts 10/05/2020   Ear pain, left 06/23/2020   Posterior neck pain 08/03/2019   Dizziness 08/03/2019   Pain of left hip joint 07/24/2019   Diarrhea 03/13/2019   Nonrheumatic aortic valve stenosis 02/26/2019   Sensorineural hearing loss (SNHL), bilateral 01/30/2019   Chronic ITP (idiopathic thrombocytopenia) (HCC) 12/01/2018   Lightheadedness 11/19/2018   Epistaxis 11/19/2018   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   Rash 03/25/2018   Chronic left-sided headaches 01/28/2018   SBO (small bowel obstruction) (HCC) 09/20/2017   Aortic stenosis, moderate 08/22/2017   CAD (coronary artery disease) 08/21/2017   Community acquired pneumonia 06/25/2017   Current chronic use of systemic steroids 06/25/2017   Varicose veins of left lower extremity with complications 03/05/2017   Hypertension 07/31/2016   Leg edema 07/31/2016   Prediabetes 06/05/2016   Osteopenia 12/01/2015   Anxiety and depression 07/19/2015   Degenerative cervical disc 12/28/2014   Left ovarian cyst    Peripheral neuropathy 06/03/2012   PVD (peripheral vascular disease) (HCC) 11/23/2010   Bursitis of hip 10/26/2010   Hyperlipidemia 06/27/2010   Unspecified glaucoma 06/27/2010   GERD 06/27/2010   Irritable bowel syndrome 06/27/2010   OVERACTIVE BLADDER 06/27/2010   OSTEOARTHRITIS, KNEE, RIGHT 06/27/2010  URINARY INCONTINENCE 06/27/2010    PCP: Pincus Sanes, MD  REFERRING PROVIDER: Yolonda Kida, *  REFERRING DIAG: rotator cuff pain  THERAPY DIAG:  No diagnosis found.   ONSET DATE: 05/07/2021  SUBJECTIVE:                                                                                                                                                                                      SUBJECTIVE STATEMENT: ***  PERTINENT  HISTORY: ***  PAIN:  Are you having pain? Yes NPRS scale: ***/10 Pain location: *** Pain orientation: {Pain Orientation:25161}  PAIN TYPE: {type:313116} Pain description: {PAIN DESCRIPTION:21022940}  Aggravating factors: *** Relieving factors: ***  PRECAUTIONS: {Therapy precautions:24002}  WEIGHT BEARING RESTRICTIONS No  FALLS:  Has patient fallen in last 6 months? {yes/no:20286} Number of falls: ***  LIVING ENVIRONMENT: Lives with: {[facility] lives with:25569::"lives with their family"} Lives in: {Lives in:25570} Stairs: {yes/no:20286}; {Stairs:24000} Has following equipment at home: {Assistive devices:23999}  OCCUPATION: ***  PLOF: {PLOF:24004}  PATIENT GOALS ***  OBJECTIVE:   DIAGNOSTIC FINDINGS:  ***  PATIENT SURVEYS:  FOTO ***  COGNITION:  Overall cognitive status: {cognition:24006}     SENSATION:  Light touch: {intact/deficits:24005}  Stereognosis: {intact/deficits:24005}  Hot/Cold: {intact/deficits:24005}  Proprioception: {intact/deficits:24005}  POSTURE: ***  UPPER EXTREMITY AROM/PROM:  A/PROM Right 07/11/2021 Left 07/11/2021  Shoulder flexion    Shoulder extension    Shoulder abduction    Shoulder adduction    Shoulder internal rotation    Shoulder external rotation    Elbow flexion    Elbow extension    Wrist flexion    Wrist extension    Wrist ulnar deviation    Wrist radial deviation    Wrist pronation    Wrist supination    (Blank rows = not tested)  UPPER EXTREMITY MMT:  MMT Right 07/11/2021 Left 07/11/2021  Shoulder flexion    Shoulder extension    Shoulder abduction    Shoulder adduction    Shoulder internal rotation    Shoulder external rotation    Middle trapezius    Lower trapezius    Elbow flexion    Elbow extension    Wrist flexion    Wrist extension    Wrist ulnar deviation    Wrist radial deviation    Wrist pronation    Wrist supination    Grip strength (lbs)    (Blank rows = not tested)  SHOULDER SPECIAL  TESTS:  Impingement tests: {shoulder impingement test:25231:a}  SLAP lesions: {SLAP lesions:25232}  Instability tests: {shoulder instability test:25233}  Rotator cuff assessment: {rotator cuff assessment:25234}  Biceps assessment: {biceps assessment:25235}  JOINT MOBILITY TESTING:  ***  PALPATION:  ***  TODAY'S TREATMENT:  ***   PATIENT EDUCATION: Education details: *** Person educated: {Person educated:25204} Education method: {Education Method:25205} Education comprehension: {Education Comprehension:25206}   HOME EXERCISE PROGRAM: ***  ASSESSMENT:  CLINICAL IMPRESSION: Patient is a 88 y.o. female who was seen today for physical therapy evaluation and treatment for shoulder pain.    OBJECTIVE IMPAIRMENTS decreased activity tolerance, decreased ROM, decreased strength, impaired perceived functional ability, impaired UE functional use, and pain.   ACTIVITY LIMITATIONS cleaning, community activity, driving, meal prep, and laundry.   PERSONAL FACTORS Age, Past/current experiences, and Time since onset of injury/illness/exacerbation are also affecting patient's functional outcome.    REHAB POTENTIAL: Good  CLINICAL DECISION MAKING: Stable/uncomplicated  EVALUATION COMPLEXITY: Low   GOALS: Goals reviewed with patient? Yes  SHORT TERM GOALS:  *** Baseline: *** Target date: {follow up:25551} Goal status: {GOALSTATUS:25110}  2.  *** Baseline: *** Target date: {follow up:25551} Goal status: {GOALSTATUS:25110}  3.  *** Baseline: *** Target date: {follow up:25551} Goal status: {GOALSTATUS:25110}  4.  *** Baseline: *** Target date: {follow up:25551} Goal status: {GOALSTATUS:25110}  5.  *** Baseline: *** Target date: {follow up:25551} Goal status: {GOALSTATUS:25110}  6.  *** Baseline: *** Target date: {follow up:25551} Goal status: {GOALSTATUS:25110}  LONG TERM GOALS:  *** Baseline: *** Target date: {follow up:25551} Goal status:  {GOALSTATUS:25110}  2.  *** Baseline: *** Target date: {follow up:25551} Goal status: {GOALSTATUS:25110}  3.  *** Baseline: *** Target date: {follow up:25551} Goal status: {GOALSTATUS:25110}  4.  *** Baseline: *** Target date: {follow up:25551} Goal status: {GOALSTATUS:25110}  5.  *** Baseline: *** Target date: {follow up:25551} Goal status: {GOALSTATUS:25110}  6.  *** Baseline: *** Target date: {follow up:25551} Goal status: {GOALSTATUS:25110}  PLAN: PT FREQUENCY: {rehab frequency:25116}  PT DURATION: {rehab duration:25117}  PLANNED INTERVENTIONS: {rehab planned interventions:25118::"Therapeutic exercises","Therapeutic activity","Neuromuscular re-education","Balance training","Gait training","Patient/Family education","Joint mobilization"}  PLAN FOR NEXT SESSION: Vivien Presto, PT 07/11/2021, 1:50 PM

## 2021-07-12 ENCOUNTER — Ambulatory Visit (HOSPITAL_BASED_OUTPATIENT_CLINIC_OR_DEPARTMENT_OTHER): Payer: Medicare Other | Admitting: Physical Therapy

## 2021-07-14 ENCOUNTER — Ambulatory Visit: Payer: Medicare Other | Admitting: Emergency Medicine

## 2021-07-14 ENCOUNTER — Encounter: Payer: Self-pay | Admitting: Emergency Medicine

## 2021-07-14 ENCOUNTER — Other Ambulatory Visit: Payer: Self-pay

## 2021-07-14 VITALS — BP 140/68 | HR 90 | Temp 97.8°F | Ht 61.5 in | Wt 150.8 lb

## 2021-07-14 DIAGNOSIS — R911 Solitary pulmonary nodule: Secondary | ICD-10-CM

## 2021-07-14 DIAGNOSIS — R9389 Abnormal findings on diagnostic imaging of other specified body structures: Secondary | ICD-10-CM

## 2021-07-14 NOTE — Assessment & Plan Note (Signed)
Reviewed serial CT scans of the chest.  There are stable opacities noted in the left lower lobe, left upper lobe, right upper lobe that I suspect are scar, possibly from prior pneumonias.  She denies any overt aspiration but she has had pneumonia before.  The hazy groundglass right upper lobe opacity has increased in size, could reflect an active inflammatory process, possibly an adenocarcinoma.  We talked about the pros and cons of evaluating further versus watchful waiting especially given her age and the predicted slow progression of an adeno CA.  We will plan to perform a PET scan to better characterize each of these lesions and help [place] plan next steps.  We did talk briefly about bronchoscopy and a tissue diagnosis, culture data if we felt it were indicated.  We can revisit depending on results. ?

## 2021-07-14 NOTE — Progress Notes (Signed)
Subjective:    Patient ID: Tamara Robertson, female    DOB: 10/23/1929, 88 y.o.   MRN: [id number]  HPI 88 year old never smoker with a history of new atrial fibrillation, noncritical coronary disease, mild to moderate aortic insufficiency and mitral regurgitation, hyperlipidemia, IBS, ITP.  She is referred today for evaluation of an abnormal CT scan of the chest. She had COVID in 05/2021, has been dealing with some dry cough since then.  Sometimes has some upper airway mucus.  She has some occasional dysphagia but denies any coughing or overt aspiration when eating  She had a chest x-ray 02/20/2021 that showed some emphysematous change and some reticulonodular opacities of unclear etiology, question pneumonia.  She then underwent CT scan of the chest 03/07/2021 that showed multiple opacities including ill-defined right upper lobe opacity 1.4 cm, slightly cavitary lesion along the periphery of the right upper lobe, groundglass opacities at the apices bilaterally.  Repeat CT scan of the chest performed 06/15/2021 reviewed by me, shows interval enlargement of irregular subsolid nodular opacity anterior right upper lobe now 2.1 x 1.6 cm, and irregular subsolid medial right lower lobe opacity 1.4 x 1.2 cm.  Additional irregular opacities unchanged and consistent with scar.  There is some bandlike fibrotic scarring and volume loss in the superior segment of the left upper lobe   Review of Systems As per HPI  Past Medical History:  Diagnosis Date   Achilles tendonitis    Anxiety    CAD (coronary artery disease)    RCA 40% stenosis cath 01/2011   Diverticular stricture [facility]) 2006   [facility]   DIVERTICULITIS, HX OF    Diverticulosis    DYSLIPIDEMIA    Elevated LFTs    GERD    Glaucoma    Hepatic steatosis    HOH (hard of hearing)    Immune thrombocytopenic purpura (HCC)    chronic - baseline 80-100K, on pred   Irritable bowel syndrome    Left ovarian cyst dx 01/2013 CT    working with gyn, ?malignant - elevated tumor marker OVA1   OSTEOARTHRITIS, KNEE, RIGHT    OSTEOPENIA    OVERACTIVE BLADDER    Plantar fasciitis    Prolapse of female bladder, acquired 11/2017   Torn rotator cuff    right shoulder   URINARY INCONTINENCE      Family History  Problem Relation Age of Onset   Coronary artery disease Mother    Heart attack Mother 43   Hyperlipidemia Mother    Hypertension Mother    Stomach cancer Father    Hypertension Daughter    Hyperlipidemia Daughter    Arthritis Other        parent   Transient ischemic attack Other        parent     Social History   Socioeconomic History   Marital status: Widowed    Spouse name: Not on file   Number of children: 2   Years of education: Not on file   Highest education level: Not on file  Occupational History   Occupation: Retired    Comment: Nutritional therapist office  Tobacco Use   Smoking status: Never   Smokeless tobacco: Never  Vaping Use   Vaping Use: Never used  Substance and Sexual Activity   Alcohol use: No   Drug use: No   Sexual activity: Not Currently    Birth control/protection: Surgical    Comment: 1st intercourse- 19, partners- 2, hysterectomy  Other Topics  Concern   Not on file  Social History Narrative   Retired Futures trader.    [place] Ham to [place] from [place] 05/2010 to be close to kids   Social Determinants of Health   Financial Resource Strain: Low Risk    Difficulty of Paying Living Expenses: Not hard at all  Food Insecurity: No Food Insecurity   Worried About Programme researcher, broadcasting/film/video in the Last Year: Never true   Barista in the Last Year: Never true  Transportation Needs: No Transportation Needs   Lack of Transportation (Medical): No   Lack of Transportation (Non-Medical): No  Physical Activity: Inactive   Days of Exercise per Week: 0 days   Minutes of Exercise per Session: 0 min  Stress: No Stress Concern Present   Feeling of Stress : Not at all  Social  Connections: Moderately Integrated   Frequency of Communication with Friends and Family: More than three times a week   Frequency of Social Gatherings with Friends and Family: Once a week   Attends Religious Services: More than 4 times per year   Active Member of [employer] or Organizations: Yes   Attends Banker Meetings: More than 4 times per year   Marital Status: Widowed  Catering manager Violence: Not on file     Allergies  Allergen Reactions   Aspirin Other (See Comments)    ITP   Sulfa Antibiotics Other (See Comments)    dizziness   Contrast Media [Iodinated Contrast Media] Hives    CAT scan contrast only   Shellfish-Derived Products Hives    Other reaction(s): Other   Eliquis [Apixaban]     Rash    Fluoxetine     Did not feel well on it    Sulfamethoxazole-Trimethoprim     Other reaction(s): Unknown   Celebrex [Celecoxib] Other (See Comments)    Leg swelling   Celexa [Citalopram] Other (See Comments)    Felt out of it   Statins Other (See Comments)     Outpatient Medications Prior to Visit  Medication Sig Dispense Refill   acetaminophen (TYLENOL) 500 MG tablet Take 500 mg by mouth every 6 (six) hours as needed.     Biotin 2500 MCG CAPS Take 1 capsule by mouth daily.      calcium carbonate (TUMS - DOSED IN MG ELEMENTAL CALCIUM) 500 MG chewable tablet Chew 1 tablet by mouth daily as needed for indigestion or heartburn.     cholecalciferol (VITAMIN D3) 25 MCG (1000 UNIT) tablet Take 1,000 Units by mouth daily.     cycloSPORINE (RESTASIS) 0.05 % ophthalmic emulsion 1 drop 2 (two) times daily.     escitalopram (LEXAPRO) 5 MG tablet Take 1 tablet (5 mg total) by mouth daily. (Patient taking differently: Take 2.5 mg by mouth daily.) 90 tablet 3   famotidine (PEPCID) 20 MG tablet Take 1 tablet (20 mg total) by mouth 2 (two) times daily as needed for heartburn or indigestion. 180 tablet 3   Flaxseed MISC Take 5 mLs by mouth daily. Take 1 tsp daily     furosemide  (LASIX) 20 MG tablet Take 40 mg (two tablets) for two days; then take 20 mg (one tablet) each day. 90 tablet 3   Glucos-Chond-Hyal Ac-Ca Fructo (MOVE FREE JOINT HEALTH ADVANCE PO) Take by mouth.     lidocaine (LMX) 4 % cream Apply 1 application topically as needed.     Magnesium Oxide 250 MG TABS Take 1 tablet by mouth.  metoprolol succinate (TOPROL-XL) 25 MG 24 hr tablet TAKE 1/2 (ONE-HALF) TABLET BY MOUTH ONCE DAILY WITH OR IMMEDIATELY FOLLOWING A MEAL 30 tablet 0   Multiple Vitamin (MULTIVITAMIN) tablet Take 1 tablet by mouth daily.       polyethylene glycol (MIRALAX / GLYCOLAX) packet Take 17 g by mouth daily as needed for moderate constipation.      predniSONE (DELTASONE) 2.5 MG tablet Take 1 tablet (2.5 mg total) by mouth every other day. 45 tablet 3   Probiotic Product (PROBIOTIC DAILY PO) Take 1 tablet by mouth daily with breakfast.     PSYLLIUM PO Take 1 tablet by mouth every other day.     triamcinolone cream (KENALOG) 0.1 % SMARTSIG:2 Topical Twice Daily     trolamine salicylate (ASPERCREME) 10 % cream Apply 1 application topically as needed for muscle pain.     No facility-administered medications prior to visit.         Objective:   Physical Exam Vitals:   07/14/21 1505  BP: 140/68  Pulse: 90  Temp: 97.8 F (36.6 C)  TempSrc: Oral  SpO2: 98%  Weight: 150 lb 12.8 oz (68.4 kg)  Height: 5' 1.5" (1.562 m)   Gen: Pleasant, elderly woman, in no distress,  normal affect  ENT: No lesions,  mouth clear,  oropharynx clear, no postnasal drip  Neck: No JVD, no stridor  Lungs: No use of accessory muscles, no crackles or wheezing on normal respiration, no wheeze on forced expiration  Cardiovascular: RRR, heart sounds normal, no murmur or gallops, trace peripheral edema  Musculoskeletal: No deformities, no cyanosis or clubbing  Neuro: alert, awake, non focal  Skin: Warm, no lesions or rash       Assessment & Plan:   Abnormal CT of the chest Reviewed serial CT  scans of the chest.  There are stable opacities noted in the left lower lobe, left upper lobe, right upper lobe that I suspect are scar, possibly from prior pneumonias.  She denies any overt aspiration but she has had pneumonia before.  The hazy groundglass right upper lobe opacity has increased in size, could reflect an active inflammatory process, possibly an adenocarcinoma.  We talked about the pros and cons of evaluating further versus watchful waiting especially given her age and the predicted slow progression of an adeno CA.  We will plan to perform a PET scan to better characterize each of these lesions and help [place] plan next steps.  We did talk briefly about bronchoscopy and a tissue diagnosis, culture data if we felt it were indicated.  We can revisit depending on results.   Levy Pupa, MD, PhD 07/14/2021, 4:42 PM [facility] [phone] or if no answer before 7:00PM call [phone] For any issues after 7:00PM please call eLink [phone]

## 2021-07-14 NOTE — Patient Instructions (Signed)
We reviewed your CT scans of the chest today. ?We will plan to get a PET scan to further evaluate opacities noted on your CT scans. ?Follow with Dr. Lamonte Sakai next available after your PET scan so we can review the results together. ?

## 2021-07-19 ENCOUNTER — Ambulatory Visit (HOSPITAL_BASED_OUTPATIENT_CLINIC_OR_DEPARTMENT_OTHER): Payer: Medicare Other | Admitting: Physical Therapy

## 2021-07-26 ENCOUNTER — Ambulatory Visit (HOSPITAL_BASED_OUTPATIENT_CLINIC_OR_DEPARTMENT_OTHER): Payer: Medicare Other | Admitting: Physical Therapy

## 2021-08-01 ENCOUNTER — Ambulatory Visit: Payer: Medicare Other | Admitting: Physical Therapy

## 2021-08-01 ENCOUNTER — Other Ambulatory Visit: Payer: Self-pay

## 2021-08-01 ENCOUNTER — Ambulatory Visit (HOSPITAL_COMMUNITY)
Admission: RE | Admit: 2021-08-01 | Discharge: 2021-08-01 | Disposition: A | Discharge status: Home / Self Care | Payer: Medicare Other | Source: Ambulatory Visit | Attending: Emergency Medicine | Admitting: Emergency Medicine

## 2021-08-01 DIAGNOSIS — R911 Solitary pulmonary nodule: Secondary | ICD-10-CM | POA: Diagnosis not present

## 2021-08-01 LAB — GLUCOSE, CAPILLARY: Glucose-Capillary: 90 mg/dL (ref 70–99)

## 2021-08-01 MED ORDER — FLUDEOXYGLUCOSE F - 18 (FDG) INJECTION
7.5000 | Freq: Once | INTRAVENOUS | Status: AC | PRN
  Administered 2021-08-01: 7.32 via INTRAVENOUS

## 2021-08-02 ENCOUNTER — Encounter: Payer: Self-pay | Admitting: Emergency Medicine

## 2021-08-02 ENCOUNTER — Ambulatory Visit: Payer: Medicare Other | Admitting: Emergency Medicine

## 2021-08-02 DIAGNOSIS — R9389 Abnormal findings on diagnostic imaging of other specified body structures: Secondary | ICD-10-CM

## 2021-08-02 NOTE — Patient Instructions (Signed)
We will plan to repeat your CT scan of the chest in late September 2023 to compare with your priors. ?Follow Dr. Lamonte Sakai in September after your CT so we can review the results together. ?

## 2021-08-02 NOTE — Assessment & Plan Note (Signed)
Her left lower lobe opacity appears to be unchanged, no significant hypermetabolism, consistent with scar.  The right upper lobe mixed density opacity is hypermetabolic, consistent with highly differentiated adenocarcinoma versus infection.  Suspect the former.  Discussed it with her today.  She is unsure whether she would want therapy.  We talked about the pros and cons of diagnostic bronchoscopy.  For now we have decided to follow conservatively, repeat her CT scan in 6 months to look for interval change.  We would still consider possible bronchoscopy for tissue diagnosis and SBRT depending on how things change, whether she develops symptoms, based on the philosophy for her care. ?

## 2021-08-02 NOTE — Addendum Note (Signed)
Addended by: Gavin Potters R on: 08/02/2021 03:38 PM ? ? Modules accepted: Orders ? ?

## 2021-08-02 NOTE — Progress Notes (Signed)
? ?Subjective:  ? ? Patient ID: Tamara Robertson, female    DOB: 1930-05-07, 88 y.o.   MRN: [id number] ? ?HPI ?88 year old never smoker with a history of new atrial fibrillation, noncritical coronary disease, mild to moderate aortic insufficiency and mitral regurgitation, hyperlipidemia, IBS, ITP.  She is referred today for evaluation of an abnormal CT scan of the chest. She had COVID in 05/2021, has been dealing with some dry cough since then.  Sometimes has some upper airway mucus.  She has some occasional dysphagia but denies any coughing or overt aspiration when eating ? ?She had a chest x-ray 02/20/2021 that showed some emphysematous change and some reticulonodular opacities of unclear etiology, question pneumonia.  She then underwent CT scan of the chest 03/07/2021 that showed multiple opacities including ill-defined right upper lobe opacity 1.4 cm, slightly cavitary lesion along the periphery of the right upper lobe, groundglass opacities at the apices bilaterally. ? ?Repeat CT scan of the chest performed 06/15/2021 reviewed by me, shows interval enlargement of irregular subsolid nodular opacity anterior right upper lobe now 2.1 x 1.6 cm, and irregular subsolid medial right lower lobe opacity 1.4 x 1.2 cm.  Additional irregular opacities unchanged and consistent with scar.  There is some bandlike fibrotic scarring and volume loss in the superior segment of the left upper lobe ? ? ?ROV 08/02/21 --follow-up visit for 88 year old woman for an abnormal CT scan of the chest and shortness of breath.  I saw her earlier this month after she had a CT chest in February that revealed enlargement of irregular subsolid nodular opacity in the right upper lobe, irregular subsolid medial right lower lobe opacity.  We performed PET scan to try to better characterize. ? ?PET scan from 08/01/2021 reviewed by me, shows that the right upper lobe subsolid nodule does have hypermetabolism.  Small left upper lobe groundglass nodules up to 5  mm.trace metabolic activity in stable LLL opacity, ? scar ? ? ?Review of Systems ?As per HPI ? ?Past Medical History:  ?Diagnosis Date  ? Achilles tendonitis   ? Anxiety   ? CAD (coronary artery disease)   ? RCA 40% stenosis cath 01/2011  ? Diverticular stricture (HCC) 2006  ? [facility]  ? DIVERTICULITIS, HX OF   ? Diverticulosis   ? DYSLIPIDEMIA   ? Elevated LFTs   ? GERD   ? Glaucoma   ? Hepatic steatosis   ? HOH (hard of hearing)   ? Immune thrombocytopenic purpura (HCC)   ? chronic - baseline 80-100K, on pred  ? Irritable bowel syndrome   ? Left ovarian cyst dx 01/2013 CT  ? working with gyn, ?malignant - elevated tumor marker OVA1  ? OSTEOARTHRITIS, KNEE, RIGHT   ? OSTEOPENIA   ? OVERACTIVE BLADDER   ? Plantar fasciitis   ? Prolapse of female bladder, acquired 11/2017  ? Torn rotator cuff   ? right shoulder  ? URINARY INCONTINENCE   ?  ? ?Family History  ?Problem Relation Age of Onset  ? Coronary artery disease Mother   ? Heart attack Mother 32  ? Hyperlipidemia Mother   ? Hypertension Mother   ? Stomach cancer Father   ? Hypertension Daughter   ? Hyperlipidemia Daughter   ? Arthritis Other   ?     parent  ? Transient ischemic attack Other   ?     parent  ?  ? ?Social History  ? ?Socioeconomic History  ? Marital status: Widowed  ?  Spouse name: Not on file  ? Number of children: 2  ? Years of education: Not on file  ? Highest education level: Not on file  ?Occupational History  ? Occupation: Retired  ?  Comment: front desk dental office  ?Tobacco Use  ? Smoking status: Never  ? Smokeless tobacco: Never  ?Vaping Use  ? Vaping Use: Never used  ?Substance and Sexual Activity  ? Alcohol use: No  ? Drug use: No  ? Sexual activity: Not Currently  ?  Birth control/protection: Surgical  ?  Comment: 1st intercourse- 19, partners- 2, hysterectomy  ?Other Topics Concern  ? Not on file  ?Social History Narrative  ? Retired Futures trader.   ? Moved to [place] from [place] 05/2010 to be close to kids   ? ?Social Determinants of Health  ? ?Financial Resource Strain: Low Risk   ? Difficulty of Paying Living Expenses: Not hard at all  ?Food Insecurity: No Food Insecurity  ? Worried About Programme researcher, broadcasting/film/video in the Last Year: Never true  ? Ran Out of Food in the Last Year: Never true  ?Transportation Needs: No Transportation Needs  ? Lack of Transportation (Medical): No  ? Lack of Transportation (Non-Medical): No  ?Physical Activity: Inactive  ? Days of Exercise per Week: 0 days  ? Minutes of Exercise per Session: 0 min  ?Stress: No Stress Concern Present  ? Feeling of Stress : Not at all  ?Social Connections: Moderately Integrated  ? Frequency of Communication with Friends and Family: More than three times a week  ? Frequency of Social Gatherings with Friends and Family: Once a week  ? Attends Religious Services: More than 4 times per year  ? Active Member of Clubs or Organizations: Yes  ? Attends Banker Meetings: More than 4 times per year  ? Marital Status: Widowed  ?Intimate Partner Violence: Not on file  ?  ? ?Allergies  ?Allergen Reactions  ? Aspirin Other (See Comments)  ?  ITP  ? Sulfa Antibiotics Other (See Comments)  ?  dizziness  ? Contrast Media [Iodinated Contrast Media] Hives  ?  CAT scan contrast only  ? Shellfish-Derived Products Hives  ?  Other reaction(s): Other  ? Eliquis [Apixaban]   ?  Rash   ? Fluoxetine   ?  Did not feel well on it ?  ? Sulfamethoxazole-Trimethoprim   ?  Other reaction(s): Unknown  ? Celebrex [Celecoxib] Other (See Comments)  ?  Leg swelling  ? Celexa [Citalopram] Other (See Comments)  ?  Felt out of it  ? Statins Other (See Comments)  ?  ? ?Outpatient Medications Prior to Visit  ?Medication Sig Dispense Refill  ? acetaminophen (TYLENOL) 500 MG tablet Take 500 mg by mouth every 6 (six) hours as needed.    ? Biotin 2500 MCG CAPS Take 1 capsule by mouth daily.     ? calcium carbonate (TUMS - DOSED IN MG ELEMENTAL CALCIUM) 500 MG chewable tablet Chew 1 tablet by  mouth daily as needed for indigestion or heartburn.    ? cholecalciferol (VITAMIN D3) 25 MCG (1000 UNIT) tablet Take 1,000 Units by mouth daily.    ? cycloSPORINE (RESTASIS) 0.05 % ophthalmic emulsion 1 drop 2 (two) times daily.    ? escitalopram (LEXAPRO) 5 MG tablet Take 1 tablet (5 mg total) by mouth daily. (Patient taking differently: Take 2.5 mg by mouth daily.) 90 tablet 3  ? famotidine (PEPCID) 20 MG tablet Take 1 tablet (  20 mg total) by mouth 2 (two) times daily as needed for heartburn or indigestion. 180 tablet 3  ? Flaxseed MISC Take 5 mLs by mouth daily. Take 1 tsp daily    ? furosemide (LASIX) 20 MG tablet Take 40 mg (two tablets) for two days; then take 20 mg (one tablet) each day. (Patient taking differently: 20 mg. Take 40 mg (two tablets) for two days; then take 20 mg (one tablet) each day.) 90 tablet 3  ? Glucos-Chond-Hyal Ac-Ca Fructo (MOVE FREE JOINT HEALTH ADVANCE PO) Take by mouth.    ? lidocaine (LMX) 4 % cream Apply 1 application topically as needed.    ? Magnesium Oxide 250 MG TABS Take 1 tablet by mouth.    ? metoprolol succinate (TOPROL-XL) 25 MG 24 hr tablet TAKE 1/2 (ONE-HALF) TABLET BY MOUTH ONCE DAILY WITH OR IMMEDIATELY FOLLOWING A MEAL 30 tablet 0  ? Multiple Vitamin (MULTIVITAMIN) tablet Take 1 tablet by mouth daily.      ? polyethylene glycol (MIRALAX / GLYCOLAX) packet Take 17 g by mouth daily as needed for moderate constipation.     ? predniSONE (DELTASONE) 2.5 MG tablet Take 1 tablet (2.5 mg total) by mouth every other day. 45 tablet 3  ? Probiotic Product (PROBIOTIC DAILY PO) Take 1 tablet by mouth daily with breakfast.    ? PSYLLIUM PO Take 1 tablet by mouth every other day.    ? triamcinolone cream (KENALOG) 0.1 % SMARTSIG:2 Topical Twice Daily    ? trolamine salicylate (ASPERCREME) 10 % cream Apply 1 application topically as needed for muscle pain.    ? ?No facility-administered medications prior to visit.  ? ? ? ? ?   ?Objective:  ? Physical Exam ?Vitals:  ? 08/02/21 1428   ?BP: 124/76  ?Pulse: 89  ?Temp: 97.8 ?F (36.6 ?C)  ?TempSrc: Oral  ?SpO2: 98%  ?Weight: 150 lb 3.2 oz (68.1 kg)  ?Height: 5\' 2"  (1.575 m)  ? ?Gen: Pleasant, elderly woman, in no distress,  normal affect ? ?E

## 2021-08-03 ENCOUNTER — Encounter: Payer: Medicare Other | Admitting: Physical Therapy

## 2021-08-04 DIAGNOSIS — I480 Paroxysmal atrial fibrillation: Secondary | ICD-10-CM | POA: Diagnosis not present

## 2021-08-04 DIAGNOSIS — I10 Essential (primary) hypertension: Secondary | ICD-10-CM

## 2021-08-04 DIAGNOSIS — F32A Depression, unspecified: Secondary | ICD-10-CM

## 2021-08-04 DIAGNOSIS — F419 Anxiety disorder, unspecified: Secondary | ICD-10-CM

## 2021-08-18 ENCOUNTER — Encounter: Payer: Self-pay | Admitting: Podiatry

## 2021-08-18 ENCOUNTER — Ambulatory Visit (INDEPENDENT_AMBULATORY_CARE_PROVIDER_SITE_OTHER): Payer: Medicare Other | Admitting: Podiatry

## 2021-08-18 DIAGNOSIS — M79674 Pain in right toe(s): Secondary | ICD-10-CM | POA: Diagnosis not present

## 2021-08-18 DIAGNOSIS — M79675 Pain in left toe(s): Secondary | ICD-10-CM | POA: Diagnosis not present

## 2021-08-18 DIAGNOSIS — B351 Tinea unguium: Secondary | ICD-10-CM

## 2021-08-25 ENCOUNTER — Ambulatory Visit: Payer: Medicare Other | Admitting: Podiatry

## 2021-08-27 NOTE — Progress Notes (Signed)
?  Subjective:  ?Patient ID: Tamara Robertson, female    DOB: Oct 01, 1929,  MRN: [id number] ? ?Tamara Robertson presents to clinic today for for at risk foot care. Patient has h/o PAD and painful elongated mycotic toenails 1-5 bilaterally which are tender when wearing enclosed shoe gear. Pain is relieved with periodic professional debridement. ? ?Patient has bilateral Haglund's deformity and has undergone physical therapy and states it did not work. She is being followed by Orthopedics for Achilles tendinitis.  Patient is also followed by Wille Celeste for CVI and PAD. She had minimal improvement with gel heel pads from [employer]. ? ?PCP is Pincus Sanes, MD , and last visit was May 17, 2021. ? ?Allergies  ?Allergen Reactions  ? Aspirin Other (See Comments)  ?  ITP  ? Sulfa Antibiotics Other (See Comments)  ?  dizziness  ? Contrast Media [Iodinated Contrast Media] Hives  ?  CAT scan contrast only  ? Shellfish-Derived Products Hives  ?  Other reaction(s): Other  ? Eliquis [Apixaban]   ?  Rash   ? Fluoxetine   ?  Did not feel well on it ?  ? Sulfamethoxazole-Trimethoprim   ?  Other reaction(s): Unknown  ? Celebrex [Celecoxib] Other (See Comments)  ?  Leg swelling  ? Celexa [Citalopram] Other (See Comments)  ?  Felt out of it  ? Statins Other (See Comments)  ? ? ?Review of Systems: Negative except as noted in the HPI. ? ?Objective: No changes noted in today's physical examination. ?Constitutional Tamara Robertson is a pleasant 88 y.o. Caucasian female, WD, WN in NAD. AAO x 3.   ?Vascular CFT <3 seconds b/l LE. Faintly palpable pedal pulses b/l LE. Pedal hair absent b/l LE. Skin temperature gradient WNL b/l. No pain with calf compression b/l. No edema b/l LE. No cyanosis or clubbing noted b/l LE.  ?Neurologic Normal speech. Oriented to person, place, and time. Protective sensation intact 5/5 intact bilaterally with 10g monofilament b/l. Vibratory sensation intact b/l.  ?Dermatologic Pedal integument with normal turgor, texture and  tone b/l LE. No open wounds b/l. No interdigital macerations b/l. Toenails 1-5 b/l elongated, thickened, discolored with subungual debris. +Tenderness with dorsal palpation of nailplates. No hyperkeratotic or porokeratotic lesions present.  ?Orthopedic: Muscle strength 5/5 to all lower extremity muscle groups bilaterally. Visible/palpable bony exostoses posterior aspect of calcaneus bilaterally consistent with Haglund's deformity. Tenderness to palpation. No erythema, no edema, no drainage, no fluctuance..  ? ?Radiographs: None ? ?  Latest Ref Rng & Units 12/14/2020  ?  4:16 PM  ?Hemoglobin A1C  ?Hemoglobin-A1c 4.6 - 6.5 % 5.8    ? ?Assessment/Plan: ?No diagnosis found.  ? ?-Patient was evaluated and treated. All patient's and/or POA's questions/concerns answered on today's visit. ?-Examined patient. ?-She is under care of Orthopedics for achilles tendinitis/Haglund's deformity; reports no improvement in symptoms with physical therapy. Patient states she willspeak to PCP about pain management referral to Neurology. ?-Toenails 1-5 b/l were debrided in length and girth with sterile nail nippers and dremel without iatrogenic bleeding.  ?-Patient/POA to call should there be question/concern in the interim.  ? ?Return in about 3 months (around 11/17/2021). ? ?Freddie Breech, DPM  ?

## 2021-08-29 NOTE — Progress Notes (Signed)
? ? ? ? ?Subjective:  ? ? Patient ID: Tamara Robertson, female    DOB: 1930-03-17, 88 y.o.   MRN: [id number] ? ?This visit occurred during the SARS-CoV-2 public health emergency.  Safety protocols were in place, including screening questions prior to the visit, additional usage of staff PPE, and extensive cleaning of exam room while observing appropriate contact time as indicated for disinfecting solutions.   ? ? ?HPI ?Tamara Robertson is here for follow up of her chronic medical problems, including htn, anxiety, depression ? ?She is seeing Dr. Delton Coombes for her lung nodule.  Recent CT scan showed right upper lobe mixed density opacity that is hypermetabolic, consistent with highly differentiated adenocarcinoma versus infection.  Dr. Delton Coombes suspected the former.  He discussed possible diagnostic bronchoscopy and biopsy versus repeat CT in 6 months. ? ?SOB is worse.  She feels fatigued and SOB with minimal activity.  She was not having this when she last saw her cardiologist.  Had recent CT scan of the lungs-possible malignancy, but that is unlikely to be causing her current symptoms. ? ?Cough - ? Left over from covid.  Occ partially brings up phlegm.   ? ?Medications and allergies reviewed with patient and updated if appropriate. ? ?Current Outpatient Medications on File Prior to Visit  ?Medication Sig Dispense Refill  ? acetaminophen (TYLENOL) 500 MG tablet Take 500 mg by mouth every 6 (six) hours as needed.    ? Biotin 2500 MCG CAPS Take 1 capsule by mouth daily.     ? calcium carbonate (TUMS - DOSED IN MG ELEMENTAL CALCIUM) 500 MG chewable tablet Chew 1 tablet by mouth daily as needed for indigestion or heartburn.    ? cholecalciferol (VITAMIN D3) 25 MCG (1000 UNIT) tablet Take 1,000 Units by mouth daily.    ? cycloSPORINE (RESTASIS) 0.05 % ophthalmic emulsion 1 drop 2 (two) times daily.    ? escitalopram (LEXAPRO) 5 MG tablet Take 1 tablet (5 mg total) by mouth daily. (Patient taking differently: Take 2.5 mg by mouth daily.) 90 tablet  3  ? famotidine (PEPCID) 20 MG tablet Take 1 tablet (20 mg total) by mouth 2 (two) times daily as needed for heartburn or indigestion. 180 tablet 3  ? Flaxseed MISC Take 5 mLs by mouth daily. Take 1 tsp daily    ? furosemide (LASIX) 20 MG tablet Take 40 mg (two tablets) for two days; then take 20 mg (one tablet) each day. (Patient taking differently: 20 mg. Take 40 mg (two tablets) for two days; then take 20 mg (one tablet) each day.) 90 tablet 3  ? Glucos-Chond-Hyal Ac-Ca Fructo (MOVE FREE JOINT HEALTH ADVANCE PO) Take by mouth.    ? lidocaine (LMX) 4 % cream Apply 1 application topically as needed.    ? Magnesium Oxide 250 MG TABS Take 1 tablet by mouth.    ? metoprolol succinate (TOPROL-XL) 25 MG 24 hr tablet TAKE 1/2 (ONE-HALF) TABLET BY MOUTH ONCE DAILY WITH OR IMMEDIATELY FOLLOWING A MEAL 30 tablet 0  ? Multiple Vitamin (MULTIVITAMIN) tablet Take 1 tablet by mouth daily.      ? polyethylene glycol (MIRALAX / GLYCOLAX) packet Take 17 g by mouth daily as needed for moderate constipation.     ? predniSONE (DELTASONE) 2.5 MG tablet Take 1 tablet (2.5 mg total) by mouth every other day. 45 tablet 3  ? Probiotic Product (PROBIOTIC DAILY PO) Take 1 tablet by mouth daily with breakfast.    ? PSYLLIUM PO Take 1 tablet by mouth  every other day.    ? triamcinolone cream (KENALOG) 0.1 % SMARTSIG:2 Topical Twice Daily    ? trolamine salicylate (ASPERCREME) 10 % cream Apply 1 application topically as needed for muscle pain.    ? [DISCONTINUED] metoprolol succinate (TOPROL-XL) 25 MG 24 hr tablet Take 1 tablet (25 mg total) by mouth daily. 30 tablet 1  ? ?No current facility-administered medications on file prior to visit.  ? ? ? ?Review of Systems  ?Constitutional:  Positive for fatigue.  ?HENT:  Positive for postnasal drip.   ?Respiratory:  Positive for cough and shortness of breath.   ?Cardiovascular:  Positive for leg swelling. Negative for chest pain and palpitations.  ?Gastrointestinal:   ?     Occ gerd only  ? ?    ?Objective:  ? ?Vitals:  ? 08/30/21 1509  ?BP: (!) 142/60  ?Pulse: 87  ?Temp: 97.9 ?F (36.6 ?C)  ?SpO2: 96%  ? ?BP Readings from Last 3 Encounters:  ?08/30/21 (!) 142/60  ?08/30/21 (!) 142/60  ?08/02/21 124/76  ? ?Wt Readings from Last 3 Encounters:  ?08/30/21 150 lb (68 kg)  ?08/30/21 150 lb (68 kg)  ?08/02/21 150 lb 3.2 oz (68.1 kg)  ? ?Body mass index is 27.44 kg/m?. ? ?  ?Physical Exam ?Constitutional:   ?   General: She is not in acute distress. ?   Appearance: Normal appearance.  ?HENT:  ?   Head: Normocephalic and atraumatic.  ?Eyes:  ?   Conjunctiva/sclera: Conjunctivae normal.  ?Cardiovascular:  ?   Rate and Rhythm: Normal rate and regular rhythm.  ?   Heart sounds: Murmur (2/6 sys) heard.  ?Pulmonary:  ?   Effort: Pulmonary effort is normal. No respiratory distress.  ?   Breath sounds: Normal breath sounds. No wheezing.  ?Musculoskeletal:  ?   Cervical back: Neck supple.  ?   Right lower leg: No edema.  ?   Left lower leg: No edema.  ?Lymphadenopathy:  ?   Cervical: No cervical adenopathy.  ?Skin: ?   General: Skin is warm and dry.  ?   Findings: No rash.  ?Neurological:  ?   Mental Status: She is alert. Mental status is at baseline.  ?Psychiatric:     ?   Mood and Affect: Mood normal.     ?   Behavior: Behavior normal.  ? ?   ? ?Lab Results  ?Component Value Date  ? WBC 14.0 (H) 05/22/2021  ? HGB 11.2 (L) 05/22/2021  ? HCT 34.2 (L) 05/22/2021  ? PLT 284.0 05/22/2021  ? GLUCOSE 121 (H) 05/22/2021  ? CHOL 190 12/14/2020  ? TRIG 262.0 (H) 12/14/2020  ? HDL 46.00 12/14/2020  ? LDLDIRECT 118.0 12/14/2020  ? LDLCALC 107 (H) 05/23/2017  ? ALT 16 05/22/2021  ? AST 19 05/22/2021  ? NA 136 05/22/2021  ? K 4.5 05/22/2021  ? CL 101 05/22/2021  ? CREATININE 0.94 05/22/2021  ? BUN 17 05/22/2021  ? CO2 27 05/22/2021  ? TSH 1.984 01/06/2021  ? INR 1.0 01/05/2021  ? HGBA1C 5.8 12/14/2020  ? ? ? ?Assessment & Plan:  ? ? ?See Problem List for Assessment and Plan of chronic medical problems.  ? ? ?

## 2021-08-30 ENCOUNTER — Encounter: Payer: Self-pay | Admitting: Internal Medicine

## 2021-08-30 ENCOUNTER — Ambulatory Visit (INDEPENDENT_AMBULATORY_CARE_PROVIDER_SITE_OTHER): Payer: Medicare Other

## 2021-08-30 ENCOUNTER — Ambulatory Visit: Payer: Medicare Other | Admitting: Internal Medicine

## 2021-08-30 VITALS — BP 142/60 | HR 87 | Temp 97.9°F | Ht 62.0 in | Wt 150.0 lb

## 2021-08-30 DIAGNOSIS — F419 Anxiety disorder, unspecified: Secondary | ICD-10-CM

## 2021-08-30 DIAGNOSIS — D649 Anemia, unspecified: Secondary | ICD-10-CM | POA: Diagnosis not present

## 2021-08-30 DIAGNOSIS — I872 Venous insufficiency (chronic) (peripheral): Secondary | ICD-10-CM

## 2021-08-30 DIAGNOSIS — R052 Subacute cough: Secondary | ICD-10-CM

## 2021-08-30 DIAGNOSIS — I10 Essential (primary) hypertension: Secondary | ICD-10-CM

## 2021-08-30 DIAGNOSIS — Z0000 Encounter for general adult medical examination without abnormal findings: Secondary | ICD-10-CM | POA: Diagnosis not present

## 2021-08-30 DIAGNOSIS — F32A Depression, unspecified: Secondary | ICD-10-CM

## 2021-08-30 LAB — BASIC METABOLIC PANEL
BUN: 17 mg/dL (ref 6–23)
CO2: 31 mEq/L (ref 19–32)
Calcium: 9.5 mg/dL (ref 8.4–10.5)
Chloride: 100 mEq/L (ref 96–112)
Creatinine, Ser: 0.83 mg/dL (ref 0.40–1.20)
GFR: 62.26 mL/min (ref >60.00)
Glucose, Bld: 107 mg/dL — ABNORMAL HIGH (ref 70–99)
Potassium: 4.2 mEq/L (ref 3.5–5.1)
Sodium: 140 mEq/L (ref 135–145)

## 2021-08-30 LAB — CBC WITH DIFFERENTIAL/PLATELET
Basophils Absolute: 0.1 10*3/uL (ref 0.0–0.1)
Basophils Relative: 0.9 % (ref 0.0–3.0)
Eosinophils Absolute: 0.1 10*3/uL (ref 0.0–0.7)
Eosinophils Relative: 1.1 % (ref 0.0–5.0)
HCT: 38.9 % (ref 36.0–46.0)
Hemoglobin: 12.7 g/dL (ref 12.0–15.0)
Lymphocytes Relative: 30.2 % (ref 12.0–46.0)
Lymphs Abs: 3.3 10*3/uL (ref 0.7–4.0)
MCHC: 32.6 g/dL (ref 30.0–36.0)
MCV: 81.4 fl (ref 78.0–100.0)
Monocytes Absolute: 0.9 10*3/uL (ref 0.1–1.0)
Monocytes Relative: 8.3 % (ref 3.0–12.0)
Neutro Abs: 6.6 10*3/uL (ref 1.4–7.7)
Neutrophils Relative %: 59.5 % (ref 43.0–77.0)
Platelets: 150 10*3/uL (ref 150.0–400.0)
RBC: 4.78 Mil/uL (ref 3.87–5.11)
RDW: 13.9 % (ref 11.5–15.5)
WBC: 11 10*3/uL — ABNORMAL HIGH (ref 4.0–10.5)

## 2021-08-30 LAB — IBC PANEL
Iron: 36 ug/dL — ABNORMAL LOW (ref 42–145)
Saturation Ratios: 8.2 % — ABNORMAL LOW (ref 20.0–50.0)
TIBC: 438.2 ug/dL (ref 250.0–450.0)
Transferrin: 313 mg/dL (ref 212.0–360.0)

## 2021-08-30 LAB — FERRITIN: Ferritin: 16 ng/mL (ref 10.0–291.0)

## 2021-08-30 NOTE — Assessment & Plan Note (Signed)
Chronic ?Blood pressure well controlled ?No longer on losartan ?Continue metoprolol XL 25 mg daily ?

## 2021-08-30 NOTE — Assessment & Plan Note (Signed)
Chronic ?Somewhat controlled ?Has seen vascular surgery ?Has chronic erythema and swelling-uses steroid cream, which helps the dermatitis ?Unable to wear compression socks because of topical medications ?Elevates legs ?

## 2021-08-30 NOTE — Assessment & Plan Note (Signed)
Chronic Controlled, Stable Continue Lexapro 2.5 mg daily 

## 2021-08-30 NOTE — Assessment & Plan Note (Signed)
Has somewhat of a chronic dry cough ?Discussed it could be from postnasal drainage versus mild GERD ?She has occasional GERD and some PND ?Start Pepcid daily for at least 1 month to see if that helps ?If no improvement consider Claritin daily ?

## 2021-08-30 NOTE — Progress Notes (Signed)
? ?Subjective:  ? Tamara Robertson is a 88 y.o. female who presents for Medicare Annual (Subsequent) preventive examination. ? ?Review of Systems    ? ?Cardiac Risk Factors include: advanced age (>37mn, >>62women);dyslipidemia;family history of premature cardiovascular disease;hypertension ? ?   ?Objective:  ?  ?Today's Vitals  ? 08/30/21 1518  ?BP: (!) 142/60  ?Pulse: 87  ?Temp: 97.9 ?F (36.6 ?C)  ?TempSrc: Oral  ?SpO2: 96%  ?Weight: 150 lb (68 kg)  ?Height: '5\' 2"'$  (1.575 m)  ?PainSc: 0-No pain  ? ?Body mass index is 27.44 kg/m?. ? ? ?  08/30/2021  ?  4:11 PM 03/21/2021  ?  2:03 PM 01/06/2021  ? 11:46 AM 01/05/2021  ?  6:22 PM 01/05/2021  ?  2:52 PM 08/05/2020  ?  2:45 PM 02/24/2019  ?  2:49 PM  ?Advanced Directives  ?Does Patient Have a Medical Advance Directive? Yes Yes Yes  Yes Yes Yes  ?Type of Advance Directive Living will;Healthcare Power of Attorney Living will;Healthcare Power of Attorney Living will;Healthcare Power of Attorney Living will;Healthcare Power of Attorney Living will;Healthcare Power of Attorney Living will;[place]  ?Does patient want to make changes to medical advance directive? No - Patient declined No - Patient declined No - Patient declined   No - Patient declined   ?Copy of [place] Chart? No - copy requested No - copy requested No - copy requested   No - copy requested No - copy requested  ? ? ?Current Medications (verified) ?Outpatient Encounter Medications as of 08/30/2021  ?Medication Sig  ? acetaminophen (TYLENOL) 500 MG tablet Take 500 mg by mouth every 6 (six) hours as needed.  ? Biotin 2500 MCG CAPS Take 1 capsule by mouth daily.   ? calcium carbonate (TUMS - DOSED IN MG ELEMENTAL CALCIUM) 500 MG chewable tablet Chew 1 tablet by mouth daily as needed for indigestion or heartburn.  ? cholecalciferol (VITAMIN D3) 25 MCG (1000 UNIT) tablet Take 1,000 Units by mouth daily.  ? cycloSPORINE (RESTASIS) 0.05 %  ophthalmic emulsion 1 drop 2 (two) times daily.  ? escitalopram (LEXAPRO) 5 MG tablet Take 1 tablet (5 mg total) by mouth daily. (Patient taking differently: Take 2.5 mg by mouth daily.)  ? famotidine (PEPCID) 20 MG tablet Take 1 tablet (20 mg total) by mouth 2 (two) times daily as needed for heartburn or indigestion.  ? Flaxseed MISC Take 5 mLs by mouth daily. Take 1 tsp daily  ? furosemide (LASIX) 20 MG tablet Take 40 mg (two tablets) for two days; then take 20 mg (one tablet) each day. (Patient taking differently: 20 mg. Take 40 mg (two tablets) for two days; then take 20 mg (one tablet) each day.)  ? Glucos-Chond-Hyal Ac-Ca Fructo (MOVE FREE JOINT HEALTH ADVANCE PO) Take by mouth.  ? lidocaine (LMX) 4 % cream Apply 1 application topically as needed.  ? Magnesium Oxide 250 MG TABS Take 1 tablet by mouth.  ? metoprolol succinate (TOPROL-XL) 25 MG 24 hr tablet TAKE 1/2 (ONE-HALF) TABLET BY MOUTH ONCE DAILY WITH OR IMMEDIATELY FOLLOWING A MEAL  ? Multiple Vitamin (MULTIVITAMIN) tablet Take 1 tablet by mouth daily.    ? polyethylene glycol (MIRALAX / GLYCOLAX) packet Take 17 g by mouth daily as needed for moderate constipation.   ? predniSONE (DELTASONE) 2.5 MG tablet Take 1 tablet (2.5 mg total) by mouth every other day.  ? Probiotic Product (PROBIOTIC DAILY PO) Take 1 tablet  by mouth daily with breakfast.  ? PSYLLIUM PO Take 1 tablet by mouth every other day.  ? triamcinolone cream (KENALOG) 0.1 % SMARTSIG:2 Topical Twice Daily  ? trolamine salicylate (ASPERCREME) 10 % cream Apply 1 application topically as needed for muscle pain.  ? [DISCONTINUED] metoprolol succinate (TOPROL-XL) 25 MG 24 hr tablet Take 1 tablet (25 mg total) by mouth daily.  ? ?No facility-administered encounter medications on file as of 08/30/2021.  ? ? ?Allergies (verified) ?Aspirin, Sulfa antibiotics, Contrast media [iodinated contrast media], Shellfish-derived products, Eliquis [apixaban], Fluoxetine, Sulfamethoxazole-trimethoprim, Celebrex  [celecoxib], Celexa [citalopram], and Statins  ? ?History: ?Past Medical History:  ?Diagnosis Date  ? Achilles tendonitis   ? Anxiety   ? CAD (coronary artery disease)   ? RCA 40% stenosis cath 01/2011  ? Diverticular stricture ([place]) 2006  ? [facility]  ? DIVERTICULITIS, HX OF   ? Diverticulosis   ? DYSLIPIDEMIA   ? Elevated LFTs   ? GERD   ? Glaucoma   ? Hepatic steatosis   ? HOH (hard of hearing)   ? Immune thrombocytopenic purpura ([place])   ? chronic - baseline 80-100K, on pred  ? Irritable bowel syndrome   ? Left ovarian cyst dx 01/2013 CT  ? working with gyn, ?malignant - elevated tumor marker OVA1  ? OSTEOARTHRITIS, KNEE, RIGHT   ? OSTEOPENIA   ? OVERACTIVE BLADDER   ? Plantar fasciitis   ? Prolapse of female bladder, acquired 11/2017  ? Torn rotator cuff   ? right shoulder  ? URINARY INCONTINENCE   ? ?Past Surgical History:  ?Procedure Laterality Date  ? ABDOMINAL HYSTERECTOMY  1963  ? APPENDECTOMY  1956  ? CARDIAC CATHETERIZATION    ? CATARACT EXTRACTION, BILATERAL  10/2010  ? CHOLECYSTECTOMY N/A 09/16/2014  ? Procedure: LAPAROSCOPIC CHOLECYSTECTOMY ;  Surgeon: Rolm Bookbinder, MD;  Location: [facility];  Service: General;  Laterality: N/A;  ? KNEE ARTHROSCOPY Right   ? L pop PTA  10/2009  ? stent  ? LAPAROSCOPIC SIGMOID COLECTOMY  10/2005  ? SPLENECTOMY  1954  ? [place]  ? ?Family History  ?Problem Relation Age of Onset  ? Coronary artery disease Mother   ? Heart attack Mother 58  ? Hyperlipidemia Mother   ? Hypertension Mother   ? Stomach cancer Father   ? Hypertension Daughter   ? Hyperlipidemia Daughter   ? Arthritis Other   ?     parent  ? Transient ischemic attack Other   ?     parent  ? ?Social History  ? ?Socioeconomic History  ? Marital status: Widowed  ?  Spouse name: Not on file  ? Number of children: 2  ? Years of education: Not on file  ? Highest education level: Not on file  ?Occupational History  ? Occupation: Retired  ?  Comment: front desk dental office   ?Tobacco Use  ? Smoking status: Never  ? Smokeless tobacco: Never  ?Vaping Use  ? Vaping Use: Never used  ?Substance and Sexual Activity  ? Alcohol use: No  ? Drug use: No  ? Sexual activity: Not Currently  ?  Birth control/protection: Surgical  ?  Comment: 1st intercourse- 19, partners- 2, hysterectomy  ?Other Topics Concern  ? Not on file  ?Social History Narrative  ? Retired Control and instrumentation engineer.   ? Moved to [place] from [place] 05/2010 to be close to kids  ? ?Social Determinants of Health  ? ?Financial Resource Strain:  Low Risk   ? Difficulty of Paying Living Expenses: Not hard at all  ?Food Insecurity: No Food Insecurity  ? Worried About Charity fundraiser in the Last Year: Never true  ? Ran Out of Food in the Last Year: Never true  ?Transportation Needs: No Transportation Needs  ? Lack of Transportation (Medical): No  ? Lack of Transportation (Non-Medical): No  ?Physical Activity: Not on file  ?Stress: No Stress Concern Present  ? Feeling of Stress : Not at all  ?Social Connections: Socially Isolated  ? Frequency of Communication with Friends and Family: More than three times a week  ? Frequency of Social Gatherings with Friends and Family: Once a week  ? Attends Religious Services: Never  ? Active Member of Clubs or Organizations: No  ? Attends Archivist Meetings: Never  ? Marital Status: Widowed  ? ? ?Tobacco Counseling ?Counseling given: Not Answered ? ? ?Clinical Intake: ? ?Pre-visit preparation completed: Yes ? ?Pain : No/denies pain ?Pain Score: 0-No pain ? ?  ? ?BMI - recorded: 27.44 ?Nutritional Status: BMI 25 -29 Overweight ?Nutritional Risks: Other (Comment) ?Diabetes: No ? 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 ?Diabetic? no ? ?Interpreter Needed?: No ? ?Information entered by :: Lisette Abu, LPN. ? ? ?Activities of Daily Living ? ?  08/30/2021  ?  3:34 PM 01/05/2021  ?  6:22 PM  ?In your present state of health, do you have any difficulty performing the following activities:  ?Hearing? 1 0

## 2021-08-30 NOTE — Patient Instructions (Signed)
Tamara Robertson , ?Thank you for taking time to come for your Medicare Wellness Visit. I appreciate your ongoing commitment to your health goals. Please review the following plan we discussed and let me know if I can assist you in the future.  ? ?Screening recommendations/referrals: ?Colonoscopy: Not a candidate for screening due to age ?Mammogram: 01/31/2021; due every year ?Bone Density: 04/15/2019; due every 3 years ?Recommended yearly ophthalmology/optometry visit for glaucoma screening and checkup ?Recommended yearly dental visit for hygiene and checkup ? ?Vaccinations: ?Influenza vaccine: 04/12/2021 ?Pneumococcal vaccine: 02/28/2011, 08/24/2014 ?Tdap vaccine: 02/28/2011; due every 10 years (overdue) ?Shingles vaccine: 05/23/2017, 10/25/2017   ?Covid-19: 06/13/2019, 07/08/2019, 02/17/2020, 08/22/2020 ? ?Advanced directives: Yes ? ?Conditions/risks identified: Yes ? ?Next appointment: Please schedule your next Medicare Wellness Visit with your Nurse Health Advisor in 1 year by calling [phone]. ? ? ?Preventive Care 86 Years and Older, Female ?Preventive care refers to lifestyle choices and visits with your health care provider that can promote health and wellness. ?What does preventive care include? ?A yearly physical exam. This is also called an annual well check. ?Dental exams once or twice a year. ?Routine eye exams. Ask your health care provider how often you should have your eyes checked. ?Personal lifestyle choices, including: ?Daily care of your teeth and gums. ?Regular physical activity. ?Eating a healthy diet. ?Avoiding tobacco and drug use. ?Limiting alcohol use. ?Practicing safe sex. ?Taking low-dose aspirin every day. ?Taking vitamin and mineral supplements as recommended by your health care provider. ?What happens during an annual well check? ?The services and screenings done by your health care provider during your annual well check will depend on your age, overall health, lifestyle risk factors, and family  history of disease. ?Counseling  ?Your health care provider may ask you questions about your: ?Alcohol use. ?Tobacco use. ?Drug use. ?Emotional well-being. ?Home and relationship well-being. ?Sexual activity. ?Eating habits. ?History of falls. ?Memory and ability to understand (cognition). ?Work and work Statistician. ?Reproductive health. ?Screening  ?You may have the following tests or measurements: ?Height, weight, and BMI. ?Blood pressure. ?Lipid and cholesterol levels. These may be checked every 5 years, or more frequently if you are over 55 years old. ?Skin check. ?Lung cancer screening. You may have this screening every year starting at age 43 if you have a 30-pack-year history of smoking and currently smoke or have quit within the past 15 years. ?Fecal occult blood test (FOBT) of the stool. You may have this test every year starting at age 66. ?Flexible sigmoidoscopy or colonoscopy. You may have a sigmoidoscopy every 5 years or a colonoscopy every 10 years starting at age 20. ?Hepatitis C blood test. ?Hepatitis B blood test. ?Sexually transmitted disease (STD) testing. ?Diabetes screening. This is done by checking your blood sugar (glucose) after you have not eaten for a while (fasting). You may have this done every 1-3 years. ?Bone density scan. This is done to screen for osteoporosis. You may have this done starting at age 21. ?Mammogram. This may be done every 1-2 years. Talk to your health care provider about how often you should have regular mammograms. ?Talk with your health care provider about your test results, treatment options, and if necessary, the need for more tests. ?Vaccines  ?Your health care provider may recommend certain vaccines, such as: ?Influenza vaccine. This is recommended every year. ?Tetanus, diphtheria, and acellular pertussis (Tdap, Td) vaccine. You may need a Td booster every 10 years. ?Zoster vaccine. You may need this after age 52. ?Pneumococcal 13-valent conjugate (  PCV13)  vaccine. One dose is recommended after age 81. ?Pneumococcal polysaccharide (PPSV23) vaccine. One dose is recommended after age 30. ?Talk to your health care provider about which screenings and vaccines you need and how often you need them. ?This information is not intended to replace advice given to you by your health care provider. Make sure you discuss any questions you have with your health care provider. ?Document Released: 05/20/2015 Document Revised: 01/11/2016 Document Reviewed: 02/22/2015 ?Elsevier Interactive Patient Education ? 2017 [place]. ? ?Fall Prevention in the Home ?Falls can cause injuries. They can happen to people of all ages. There are many things you can do to make your home safe and to help prevent falls. ?What can I do on the outside of my home? ?Regularly fix the edges of walkways and driveways and fix any cracks. ?Remove anything that might make you trip as you walk through a door, such as a raised step or threshold. ?Trim any bushes or trees on the path to your home. ?Use bright outdoor lighting. ?Clear any walking paths of anything that might make someone trip, such as rocks or tools. ?Regularly check to see if handrails are loose or broken. Make sure that both sides of any steps have handrails. ?Any raised decks and porches should have guardrails on the edges. ?Have any leaves, snow, or ice cleared regularly. ?Use sand or salt on walking paths during winter. ?Clean up any spills in your garage right away. This includes oil or grease spills. ?What can I do in the bathroom? ?Use night lights. ?Install grab bars by the toilet and in the tub and shower. Do not use towel bars as grab bars. ?Use non-skid mats or decals in the tub or shower. ?If you need to sit down in the shower, use a plastic, non-slip stool. ?Keep the floor dry. Clean up any water that spills on the floor as soon as it happens. ?Remove soap buildup in the tub or shower regularly. ?Attach bath mats securely with  double-sided non-slip rug tape. ?Do not have throw rugs and other things on the floor that can make you trip. ?What can I do in the bedroom? ?Use night lights. ?Make sure that you have a light by your bed that is easy to reach. ?Do not use any sheets or blankets that are too big for your bed. They should not hang down onto the floor. ?Have a firm chair that has side arms. You can use this for support while you get dressed. ?Do not have throw rugs and other things on the floor that can make you trip. ?What can I do in the kitchen? ?Clean up any spills right away. ?Avoid walking on wet floors. ?Keep items that you use a lot in easy-to-reach places. ?If you need to reach something above you, use a strong step stool that has a grab bar. ?Keep electrical cords out of the way. ?Do not use floor polish or wax that makes floors slippery. If you must use wax, use non-skid floor wax. ?Do not have throw rugs and other things on the floor that can make you trip. ?What can I do with my stairs? ?Do not leave any items on the stairs. ?Make sure that there are handrails on both sides of the stairs and use them. Fix handrails that are broken or loose. Make sure that handrails are as long as the stairways. ?Check any carpeting to make sure that it is firmly attached to the stairs. Fix any carpet that is  loose or worn. ?Avoid having throw rugs at the top or bottom of the stairs. If you do have throw rugs, attach them to the floor with carpet tape. ?Make sure that you have a light switch at the top of the stairs and the bottom of the stairs. If you do not have them, ask someone to add them for you. ?What else can I do to help prevent falls? ?Wear shoes that: ?Do not have high heels. ?Have rubber bottoms. ?Are comfortable and fit you well. ?Are closed at the toe. Do not wear sandals. ?If you use a stepladder: ?Make sure that it is fully opened. Do not climb a closed stepladder. ?Make sure that both sides of the stepladder are locked  into place. ?Ask someone to hold it for you, if possible. ?Clearly mark and make sure that you can see: ?Any grab bars or handrails. ?First and last steps. ?Where the edge of each step is. ?Use tools that help you move

## 2021-08-30 NOTE — Patient Instructions (Addendum)
?  Have blood work today ? ? ?For the cough - try taking pepcid daily for one month.  If the cough does not improve try taking claritin 10 mg daily.   ? ? ?Follow up with the foot doctor.   ? ? ? ?Medications changes include :   none ? ? ? ?Return in about 6 months (around 03/01/2022) for follow up. ? ?

## 2021-08-30 NOTE — Assessment & Plan Note (Signed)
Had recent mild anemia with episodes of melena ?Denies any melena or abnormal stool at this time ?Does have some shortness of breath and fatigue with minimal activity ?Recheck CBC, iron levels ?Has appointment with GI soon-if blood counts are normal and she has not had any issues with melena recently most likely can cancel the appointment ?

## 2021-09-07 ENCOUNTER — Encounter: Payer: Self-pay | Admitting: Internal Medicine

## 2021-09-15 ENCOUNTER — Other Ambulatory Visit: Payer: Self-pay | Admitting: *Deleted

## 2021-09-15 DIAGNOSIS — I10 Essential (primary) hypertension: Secondary | ICD-10-CM

## 2021-09-15 MED ORDER — METOPROLOL SUCCINATE ER 25 MG PO TB24: ORAL_TABLET | ORAL | 3 refills | Status: DC

## 2021-09-16 ENCOUNTER — Other Ambulatory Visit: Payer: Self-pay

## 2021-09-16 DIAGNOSIS — I10 Essential (primary) hypertension: Secondary | ICD-10-CM

## 2021-09-16 MED ORDER — METOPROLOL SUCCINATE ER 25 MG PO TB24: ORAL_TABLET | ORAL | 10 refills | Status: DC

## 2021-09-25 ENCOUNTER — Other Ambulatory Visit (INDEPENDENT_AMBULATORY_CARE_PROVIDER_SITE_OTHER): Payer: Medicare Other

## 2021-09-25 ENCOUNTER — Ambulatory Visit: Payer: Medicare Other | Admitting: Internal Medicine

## 2021-09-25 VITALS — BP 122/60 | HR 82 | Ht 61.5 in | Wt 149.8 lb

## 2021-09-25 DIAGNOSIS — K5909 Other constipation: Secondary | ICD-10-CM

## 2021-09-25 DIAGNOSIS — K921 Melena: Secondary | ICD-10-CM

## 2021-09-25 DIAGNOSIS — R195 Other fecal abnormalities: Secondary | ICD-10-CM

## 2021-09-25 LAB — IBC + FERRITIN
Ferritin: 13.4 ng/mL (ref 10.0–291.0)
Iron: 65 ug/dL (ref 42–145)
Saturation Ratios: 15.4 % — ABNORMAL LOW (ref 20.0–50.0)
TIBC: 421.4 ug/dL (ref 250.0–450.0)
Transferrin: 301 mg/dL (ref 212.0–360.0)

## 2021-09-25 LAB — CBC WITH DIFFERENTIAL/PLATELET
Basophils Absolute: 0.1 10*3/uL (ref 0.0–0.1)
Basophils Relative: 0.6 % (ref 0.0–3.0)
Eosinophils Absolute: 0.1 10*3/uL (ref 0.0–0.7)
Eosinophils Relative: 0.9 % (ref 0.0–5.0)
HCT: 39.4 % (ref 36.0–46.0)
Hemoglobin: 12.8 g/dL (ref 12.0–15.0)
Lymphocytes Relative: 27.4 % (ref 12.0–46.0)
Lymphs Abs: 2.9 10*3/uL (ref 0.7–4.0)
MCHC: 32.5 g/dL (ref 30.0–36.0)
MCV: 81 fl (ref 78.0–100.0)
Monocytes Absolute: 0.8 10*3/uL (ref 0.1–1.0)
Monocytes Relative: 7.9 % (ref 3.0–12.0)
Neutro Abs: 6.8 10*3/uL (ref 1.4–7.7)
Neutrophils Relative %: 63.2 % (ref 43.0–77.0)
Platelets: 148 10*3/uL — ABNORMAL LOW (ref 150.0–400.0)
RBC: 4.87 Mil/uL (ref 3.87–5.11)
RDW: 14.3 % (ref 11.5–15.5)
WBC: 10.7 10*3/uL — ABNORMAL HIGH (ref 4.0–10.5)

## 2021-09-25 NOTE — Patient Instructions (Signed)
Your provider has requested that you go to the basement level for lab work before leaving today. Press "B" on the elevator. The lab is located at the first door on the left as you exit the elevator.  Please purchase the following medications over the counter and take as directed: Miralax 9 g-17 g daily  Continue Fiber daily.  Follow up with Dr Hilarie Fredrickson in July or August 2023.  If you are age 86 or older, your body mass index should be between 23-30. Your Body mass index is 27.85 kg/m. If this is out of the aforementioned range listed, please consider follow up with your Primary Care Provider.  If you are age 64 or younger, your body mass index should be between 19-25. Your Body mass index is 27.85 kg/m. If this is out of the aformentioned range listed, please consider follow up with your Primary Care Provider.   ________________________________________________________  The [place] [facility] providers would like to encourage you to use [facility] to communicate with providers for non-urgent requests or questions.  Due to long hold times on the telephone, sending your provider a message by [facility] may be a faster and more efficient way to get a response.  Please allow 48 business hours for a response.  Please remember that this is for non-urgent requests.  _______________________________________________________  Due to recent changes in healthcare laws, you may see the results of your imaging and laboratory studies on MyChart before your provider has had a chance to review them.  We understand that in some cases there may be results that are confusing or concerning to you. Not all laboratory results come back in the same time frame and the provider may be waiting for multiple results in order to interpret others.  Please give [place] 48 hours in order for your provider to thoroughly review all the results before contacting the office for clarification of your results.

## 2021-09-26 ENCOUNTER — Encounter: Payer: Self-pay | Admitting: Internal Medicine

## 2021-09-26 NOTE — Progress Notes (Signed)
Subjective:    Patient ID: Tamara Robertson, female    DOB: [birth year], 88 y.o.   MRN: [id number]  HPI Tamara Robertson is a 88 year old female with a history of GERD, IBS with chronic constipation, history of complicated diverticular disease requiring sigmoidectomy in 2007 for stricture, prior SBO felt secondary to adhesive disease, peripheral vascular disease, history of ITP, prior cholecystectomy and splenectomy, recent diagnosis of lung cancer on observation who is seen for follow-up for previous melenic stool and heme positive stool.  She is here alone today.  I last saw her in December 2020.  She has been recently diagnosed with lung cancer by Dr. Delton Coombes.  This was after having followed pulmonary nodule.  She has elected for observation with repeat imaging in 6 months time.  She does not want to pursue radiation or invasive treatment.  She is not having any pulmonary symptoms currently.  She reports that she had 2 mornings of very dark nearly black stools in January 2023.  This was isolated and has not recurred.  She saw primary care and had a heme positive stool at that time.  Bowel movements of late have been smaller and more like balls then complete formed stool.  She has not had any diarrhea and knows that stress causes loose stools.  At times her stools can be hard to pass requiring straining.  She is currently using MiraLAX half dose every other day alternating every other day with a fiber capsule.  She does admit to having a relatively low fiber diet.  She recalls back in January 1 day prior to seeing the black stool she had upper abdominal pain on 1 morning.  She did not relate this to eating.  Subsequently no further abdominal pain.  No nausea or vomiting.  No dysphagia.  She uses Pepcid only if her heartburn is "really bad".  Indigestion is diet dependent and she estimates she uses Tums 2 to 3 days/week.  Recently she ate pizza and took a Tums preemptively prior to the meal and had no  issues.   Review of Systems As per HPI, otherwise negative  Current Medications, Allergies, Past Medical History, Past Surgical History, Family History and Social History were reviewed in [employer] record.    Objective:   Physical Exam BP 122/60   Pulse 82   Ht 5' 1.5" (1.562 m)   Wt 149 lb 12.8 oz (67.9 kg)   BMI 27.85 kg/m  Gen: awake, alert, NAD HEENT: anicteric, op clear CV: RRR, no mrg Pulm: CTA b/l Abd: soft, NT/ND, +BS throughout Ext: no c/c/e Neuro: nonfocal     Latest Ref Rng & Units 09/25/2021    3:02 PM 08/30/2021    4:09 PM 05/22/2021    3:44 PM  CBC  WBC 4.0 - 10.5 K/uL 10.7   11.0   14.0    Hemoglobin 12.0 - 15.0 g/dL 40.9   81.1   91.4    Hematocrit 36.0 - 46.0 % 39.4   38.9   34.2    Platelets 150.0 - 400.0 K/uL 148.0   150.0   284.0     Iron/TIBC/Ferritin/ %Sat    Component Value Date/Time   IRON 65 09/25/2021 1502   TIBC 421.4 09/25/2021 1502   FERRITIN 13.4 09/25/2021 1502   IRONPCTSAT 15.4 (L) 09/25/2021 1502   CMP     Component Value Date/Time   NA 140 08/30/2021 1609   NA 142 04/04/2021 1627   NA 142 02/16/2016 1526  K 4.2 08/30/2021 1609   K 4.3 02/16/2016 1526   CL 100 08/30/2021 1609   CL 105 04/17/2012 1452   CO2 31 08/30/2021 1609   CO2 27 02/16/2016 1526   GLUCOSE 107 (H) 08/30/2021 1609   GLUCOSE 184 (H) 02/16/2016 1526   GLUCOSE 122 (H) 04/17/2012 1452   BUN 17 08/30/2021 1609   BUN 17 04/04/2021 1627   BUN 14.3 02/16/2016 1526   CREATININE 0.83 08/30/2021 1609   CREATININE 0.82 01/20/2020 1525   CREATININE 0.8 02/16/2016 1526   CALCIUM 9.5 08/30/2021 1609   CALCIUM 9.5 02/16/2016 1526   PROT 7.2 05/22/2021 1544   PROT 7.4 02/16/2016 1526   ALBUMIN 4.1 05/22/2021 1544   ALBUMIN 3.7 02/16/2016 1526   AST 19 05/22/2021 1544   AST 19 02/16/2016 1526   ALT 16 05/22/2021 1544   ALT 11 02/16/2016 1526   ALKPHOS 67 05/22/2021 1544   ALKPHOS 92 02/16/2016 1526   BILITOT 0.3 05/22/2021 1544    BILITOT 0.33 02/16/2016 1526   GFRNONAA >60 01/06/2021 0331   GFRNONAA 64 01/20/2020 1525   GFRAA 74 01/20/2020 1525   NUCLEAR MEDICINE PET SKULL BASE TO THIGH   TECHNIQUE: 7.32 mCi F-18 FDG was injected intravenously. Full-ring PET imaging was performed from the skull base to thigh after the radiotracer. CT data was obtained and used for attenuation correction and anatomic localization.   Fasting blood glucose: 90 mg/dl   COMPARISON:  Comparison is made with June 15, 2021 and March 07, 2021 chest CTs.   FINDINGS: Mediastinal blood pool activity: SUV max 2.27   Liver activity: SUV max NA   NECK: No hypermetabolic lymph nodes in the neck.   Incidental CT findings: none   CHEST: Subsolid nodule in the RIGHT upper lobe measuring 15 x 13 mm (image 56/4) ground-glass component up to 2 cm, limited size assessment due to motion artifact. Maximum SUV of 6.77.   Additional subsolid area (image 44/4) anterior RIGHT upper lobe approximately 1 cm with subtle increased FDG uptake though less than mediastinal blood pool at 1.8 maximum SUV.   Area along the pleural surface in the RIGHT upper lobe contiguous with apical scarring but with more focal nodular appearance is unchanged compared to recent CT imaging measuring approximately 11 x 13 mm at the site of greatest nodularity with a maximum SUV of 1.6.   Subtle subsolid nodule along the pleural surface in the RIGHT lower lobe (image 59/4) 6 mm.   Small ground-glass nodules in the LEFT upper lobe better seen on previous imaging, faint ground-glass on image 50 of series 4 measuring 5 mm.   Bandlike area of with bronchiectatic changes, subtle surrounding ground-glass and parenchymal distortion in the LEFT lower lobe. Along the posterior margin measuring up to 2 cm greatest thickness previously approximately 1.6-1.7 cm greatest thickness when measured on the study of June 15, 2021 and showing mild FDG uptake with a maximum  SUV of 2.97. No signs of hypermetabolic nodal disease in the chest.   Incidental CT findings: Calcified atheromatous plaque of the thoracic aorta. No aneurysmal dilation. Heart size normal without pericardial effusion. Normal caliber central pulmonary vessels. Limited assessment of cardiovascular structures given lack of intravenous contrast.   ABDOMEN/PELVIS: No abnormal hypermetabolic activity within the liver, pancreas, adrenal glands, or spleen. No hypermetabolic lymph nodes in the abdomen or pelvis.   Incidental CT findings: Post cholecystectomy. Mild hepatic steatosis. No acute findings relative to the pancreas. Post splenectomy. Subtle splenosis underneath the LEFT  hemidiaphragm. Adrenal glands are normal. Smooth contour the bilateral kidneys without acute process.   No acute gastrointestinal findings. Colonic diverticulosis. Postoperative changes of partial colonic resection as before.   LEFT adnexal cyst, homogeneous low-density 4.9 x 3.6 cm within 1 mm of previous measurements obtained by this observer on the prior study from March 04, 2020, pelvic MRI. Found to be simple and without nodular enhancement or suspicious features at that time and without areas of increased metabolic activity on the current study. Compatible with benign finding. No follow-up recommended for this finding.   Aortic atherosclerosis without aneurysm.   SKELETON: No focal hypermetabolic activity to suggest skeletal metastasis.   Incidental CT findings: No acute bone finding. No destructive bone process. Spinal degenerative changes.   IMPRESSION: Hypermetabolic subsolid nodule in the anterior RIGHT upper lobe compatible with bronchogenic neoplasm.   Multifocal areas of ground-glass and subsolid nodularity suspicious for multifocal bronchogenic neoplasm.   No signs of adenopathy in the chest or metastatic disease.   LEFT lower lobe process that shows increasing thickness and peripheral  ground-glass changes. While potentially related to post infectious scarring given increased size over time, equivocal for potential additional site of disease and showing mild to moderate FDG uptake, above mediastinal blood pool.   Aortic atherosclerosis.   Aortic Atherosclerosis (ICD10-I70.0).     Electronically Signed   By: Donzetta Kohut M.D.   On: 08/02/2021 09:18      Assessment & Plan:  88 year old female with a history of GERD, IBS with chronic constipation, history of complicated diverticular disease requiring sigmoidectomy in 2007 for stricture, prior SBO felt secondary to adhesive disease, peripheral vascular disease, history of ITP, prior cholecystectomy and splenectomy, recent diagnosis of lung cancer on observation who is seen for follow-up for previous melenic stool and heme positive stool.   Isolated melena/heme positive stool --she had 2 days of melenic stool which were heme positive but this has not recurred.  She is not anemic.  Her iron studies are borderline low but ferritin remains normal.  Her MCV is normal and she is not on iron replacement.  She also had an extremely reassuring PET scan from a GI perspective.  No evidence of GI tract abnormalities or neoplasm.  She does have a history of GERD.  We discussed her symptoms and I have recommended that we repeat blood counts, iron studies, consider iron replacement and monitor for recurrent bleeding.  In the event of recurrent melena I would recommend PPI and upper endoscopy.  We are holding off on upper endoscopy given that his been multiple months that she is seeing black stools and lack of overt GI symptoms and reassuring imaging --Monitor for recurrent black tarry stool --Repeat CBC and iron studies today --If melena recurs, she becomes progressively more anemic consider upper endoscopy PPI initiation  2.  Constipation --history of the same.  Again reassuring PET scan from a colonic perspective.  Using very low-dose  every other day MiraLAX --Increase MiraLAX to daily at half dose for now though consider increasing to full dose daily.  Titrate to normal bowel movement --Can continue fiber supplementation daily  3.  Borderline iron stores --repeat iron studies today.  Consider iron supplementation but would likely need higher doses of constipation treatment with MiraLAX or stool softener.  Ferritin normal as well as MCV and hemoglobin.  See #1  30 minutes total spent today including patient facing time, coordination of care, reviewing medical history/procedures/pertinent radiology studies, and documentation of the encounter.

## 2021-09-28 ENCOUNTER — Encounter: Payer: Self-pay | Admitting: Internal Medicine

## 2021-09-28 ENCOUNTER — Other Ambulatory Visit: Payer: Self-pay

## 2021-09-28 DIAGNOSIS — K921 Melena: Secondary | ICD-10-CM

## 2021-09-28 MED ORDER — FERROUS SULFATE 325 (65 FE) MG PO TBEC: 325.0000 mg | DELAYED_RELEASE_TABLET | ORAL | 0 refills | Status: DC

## 2021-10-05 LAB — HM DIABETES EYE EXAM

## 2021-10-12 ENCOUNTER — Telehealth: Payer: Self-pay | Admitting: *Deleted

## 2021-10-12 DIAGNOSIS — K921 Melena: Secondary | ICD-10-CM

## 2021-10-12 NOTE — Telephone Encounter (Signed)
This is very likely oral iron induced Agree with MiraLAX to combat constipation induced by oral iron Have her come in for CBC for reassurance

## 2021-10-12 NOTE — Telephone Encounter (Signed)
Left message for patient to call back  

## 2021-10-12 NOTE — Telephone Encounter (Signed)
Contacted patient to advise of appointment scheduled for her in August as requested. Patient verbalizes understanding of appointment but indicates that since taking iron, she has had black stools and constipation. I advised that both of these are very common side effects on oral iron. She tells me she is taking a stool softener but it is not really helping. I advised that she can increase to 2 stool softeners daily or take Miralax to help with constipation relief. Patient continues to be worried about the dark stool as she says she had a bleed about 3 months ago. She says she has been having dizziness over the last couple weeks with some occasional sob. Dr Hilarie Fredrickson, please advise.Marland KitchenMarland Kitchen

## 2021-10-13 ENCOUNTER — Other Ambulatory Visit (INDEPENDENT_AMBULATORY_CARE_PROVIDER_SITE_OTHER): Payer: Medicare Other

## 2021-10-13 DIAGNOSIS — K921 Melena: Secondary | ICD-10-CM | POA: Diagnosis not present

## 2021-10-13 LAB — CBC WITH DIFFERENTIAL/PLATELET
Basophils Absolute: 0.1 10*3/uL (ref 0.0–0.1)
Basophils Relative: 0.6 % (ref 0.0–3.0)
Eosinophils Absolute: 0.1 10*3/uL (ref 0.0–0.7)
Eosinophils Relative: 1.3 % (ref 0.0–5.0)
HCT: 39 % (ref 36.0–46.0)
Hemoglobin: 12.8 g/dL (ref 12.0–15.0)
Lymphocytes Relative: 31.7 % (ref 12.0–46.0)
Lymphs Abs: 2.9 10*3/uL (ref 0.7–4.0)
MCHC: 32.7 g/dL (ref 30.0–36.0)
MCV: 80.4 fl (ref 78.0–100.0)
Monocytes Absolute: 0.7 10*3/uL (ref 0.1–1.0)
Monocytes Relative: 7.8 % (ref 3.0–12.0)
Neutro Abs: 5.4 10*3/uL (ref 1.4–7.7)
Neutrophils Relative %: 58.6 % (ref 43.0–77.0)
Platelets: 146 10*3/uL — ABNORMAL LOW (ref 150.0–400.0)
RBC: 4.85 Mil/uL (ref 3.87–5.11)
RDW: 14.7 % (ref 11.5–15.5)
WBC: 9.3 10*3/uL (ref 4.0–10.5)

## 2021-10-13 NOTE — Telephone Encounter (Signed)
I have spoken to patient and discussed Dr Vena Rua response. Patient states she will come for labs 10/13/21.

## 2021-10-29 DIAGNOSIS — M7989 Other specified soft tissue disorders: Secondary | ICD-10-CM | POA: Insufficient documentation

## 2021-10-31 ENCOUNTER — Ambulatory Visit: Payer: Medicare Other | Admitting: Cardiology

## 2021-10-31 ENCOUNTER — Encounter: Payer: Self-pay | Admitting: Cardiology

## 2021-10-31 VITALS — BP 149/65 | HR 68 | Ht 61.0 in | Wt 150.2 lb

## 2021-10-31 DIAGNOSIS — M7989 Other specified soft tissue disorders: Secondary | ICD-10-CM | POA: Diagnosis not present

## 2021-10-31 DIAGNOSIS — I10 Essential (primary) hypertension: Secondary | ICD-10-CM | POA: Diagnosis not present

## 2021-10-31 DIAGNOSIS — I480 Paroxysmal atrial fibrillation: Secondary | ICD-10-CM

## 2021-10-31 DIAGNOSIS — R42 Dizziness and giddiness: Secondary | ICD-10-CM | POA: Diagnosis not present

## 2021-11-13 ENCOUNTER — Encounter: Payer: Self-pay | Admitting: Internal Medicine

## 2021-11-13 NOTE — Progress Notes (Signed)
Outside notes received. Information abstracted. Notes sent to scan.  

## 2021-11-16 ENCOUNTER — Emergency Department (HOSPITAL_COMMUNITY)
Admission: EM | Admit: 2021-11-16 | Discharge: 2021-11-16 | Disposition: A | Discharge status: Home / Self Care | Payer: Medicare Other | Attending: Emergency Medicine | Admitting: Emergency Medicine

## 2021-11-16 ENCOUNTER — Other Ambulatory Visit: Payer: Self-pay

## 2021-11-16 ENCOUNTER — Emergency Department (HOSPITAL_COMMUNITY): Payer: Medicare Other

## 2021-11-16 ENCOUNTER — Encounter (HOSPITAL_COMMUNITY): Payer: Self-pay

## 2021-11-16 DIAGNOSIS — K5900 Constipation, unspecified: Secondary | ICD-10-CM | POA: Diagnosis not present

## 2021-11-16 DIAGNOSIS — R1084 Generalized abdominal pain: Secondary | ICD-10-CM | POA: Insufficient documentation

## 2021-11-16 DIAGNOSIS — R1030 Lower abdominal pain, unspecified: Secondary | ICD-10-CM | POA: Diagnosis present

## 2021-11-16 LAB — COMPREHENSIVE METABOLIC PANEL
ALT: 17 U/L (ref 0–44)
AST: 24 U/L (ref 15–41)
Albumin: 4.5 g/dL (ref 3.5–5.0)
Alkaline Phosphatase: 71 U/L (ref 38–126)
Anion gap: 11 (ref 5–15)
BUN: 16 mg/dL (ref 8–23)
CO2: 28 mmol/L (ref 22–32)
Calcium: 9.8 mg/dL (ref 8.9–10.3)
Chloride: 100 mmol/L (ref 98–111)
Creatinine, Ser: 0.84 mg/dL (ref 0.44–1.00)
GFR, Estimated: >60 mL/min (ref >60)
Glucose, Bld: 110 mg/dL — ABNORMAL HIGH (ref 70–99)
Potassium: 4.2 mmol/L (ref 3.5–5.1)
Sodium: 139 mmol/L (ref 135–145)
Total Bilirubin: 0.6 mg/dL (ref 0.3–1.2)
Total Protein: 8.7 g/dL — ABNORMAL HIGH (ref 6.5–8.1)

## 2021-11-16 LAB — CBC WITH DIFFERENTIAL/PLATELET
Abs Immature Granulocytes: 0.03 10*3/uL (ref 0.00–0.07)
Basophils Absolute: 0.1 10*3/uL (ref 0.0–0.1)
Basophils Relative: 1 %
Eosinophils Absolute: 0.1 10*3/uL (ref 0.0–0.5)
Eosinophils Relative: 1 %
HCT: 43.3 % (ref 36.0–46.0)
Hemoglobin: 14.1 g/dL (ref 12.0–15.0)
Immature Granulocytes: 0 %
Lymphocytes Relative: 33 %
Lymphs Abs: 3.7 10*3/uL (ref 0.7–4.0)
MCH: 26.6 pg (ref 26.0–34.0)
MCHC: 32.6 g/dL (ref 30.0–36.0)
MCV: 81.5 fL (ref 80.0–100.0)
Monocytes Absolute: 1 10*3/uL (ref 0.1–1.0)
Monocytes Relative: 9 %
Neutro Abs: 6.2 10*3/uL (ref 1.7–7.7)
Neutrophils Relative %: 56 %
Platelets: 205 10*3/uL (ref 150–400)
RBC: 5.31 MIL/uL — ABNORMAL HIGH (ref 3.87–5.11)
RDW: 15.6 % — ABNORMAL HIGH (ref 11.5–15.5)
WBC: 11.1 10*3/uL — ABNORMAL HIGH (ref 4.0–10.5)
nRBC: 0 % (ref 0.0–0.2)

## 2021-11-16 LAB — URINALYSIS, ROUTINE W REFLEX MICROSCOPIC
Bilirubin Urine: NEGATIVE
Glucose, UA: NEGATIVE mg/dL
Hgb urine dipstick: NEGATIVE
Ketones, ur: NEGATIVE mg/dL
Leukocytes,Ua: NEGATIVE
Nitrite: NEGATIVE
Protein, ur: NEGATIVE mg/dL
Specific Gravity, Urine: 1.005 (ref 1.005–1.030)
pH: 6 (ref 5.0–8.0)

## 2021-11-16 LAB — LIPASE, BLOOD: Lipase: 24 U/L (ref 11–51)

## 2021-11-16 NOTE — ED Provider Triage Note (Signed)
Emergency Medicine Provider Triage Evaluation Note  Tamara Robertson , a 88 y.o. female  was evaluated in triage.  Pt complains of abdominal pain, constipation.  History of SBO due to multiple abdominal surgeries.  Using OTC medications to help with constipation however no relief.  Passed flatus 2 days ago however none since.  Pain generalized in nature.  No dysuria, hematuria.  Has had some nausea without vomiting.  No chest pain or shortness of breath  Review of Systems  Positive: Abdominal  pain, constipation Negative: Fever, emesis, back pain  Physical Exam  BP (!) 183/75   Pulse 79   Temp 98 F (36.7 C) (Oral)   Resp 16   SpO2 100%  Gen:   Awake, no distress   Resp:  Normal effort  MSK:   Moves extremities without difficulty  Abd:  Distended, generalized tenderness, old surgical incisions Other:    Medical Decision Making  Medically screening exam initiated at 3:16 PM.  Appropriate orders placed.  Tamara Robertson was informed that the remainder of the evaluation will be completed by another provider, this initial triage assessment does not replace that evaluation, and the importance of remaining in the ED until their evaluation is complete.  Abd pain, constipation   Clemens Lachman A, PA-C 11/16/21 1518

## 2021-11-16 NOTE — ED Notes (Signed)
Pt ambulatory without assistance.  

## 2021-11-16 NOTE — ED Triage Notes (Signed)
Pt BIB EMS from home. Pt c/o constipation x5 days. Pt c/o right lower back pain x2 days ago. Pt denies bruising, swelling. Pt has hx of constipation.  140/90 80 hR 98% RA CBG 106 Pt states her daughter will pick her up when pt gets discharged.

## 2021-11-16 NOTE — ED Notes (Signed)
Pt states she has a follow up appointment with her cardiologist in which she will follow up regarding HTN. Notified RN and EDP who agree with this plan.

## 2021-11-16 NOTE — ED Notes (Signed)
I provided reinforced discharge education based off of discharge summary/care provided. Pt acknowledged and understood my education. Pt had no further questions/concerns for provider/myself.   

## 2021-11-16 NOTE — ED Provider Notes (Signed)
[facility] DEPT Provider Note   CSN: [id number] Arrival date & time: 11/16/21  1401     History  Chief Complaint  Patient presents with   Constipation    Tamara Robertson is a 88 y.o. female.  Patient here constipation.  Lower abdominal pain.  History of constipation.  Denies any nausea or vomiting.  Has had multiple bowel surgeries in the past.  Nothing has made it worse or better.  Denies any pain with urination.  No chest pain or shortness of breath.  Is passing gas.  No fevers or chills or weakness or numbness.  The history is provided by the patient.       Home Medications Prior to Admission medications   Medication Sig Start Date End Date Taking? Authorizing Provider  acetaminophen (TYLENOL) 500 MG tablet Take 500 mg by mouth every 6 (six) hours as needed.    [provider]  Biotin 2500 MCG CAPS Take 1 capsule by mouth daily.     [provider]  calcium carbonate (TUMS - DOSED IN MG ELEMENTAL CALCIUM) 500 MG chewable tablet Chew 1 tablet by mouth daily as needed for indigestion or heartburn.    [provider]  cholecalciferol (VITAMIN D3) 25 MCG (1000 UNIT) tablet Take 1,000 Units by mouth daily.    [provider]  escitalopram (LEXAPRO) 5 MG tablet Take 1 tablet (5 mg total) by mouth daily. Patient taking differently: Take 2.5 mg by mouth daily. 01/20/21   Binnie Rail, MD  famotidine (PEPCID) 20 MG tablet Take 1 tablet (20 mg total) by mouth 2 (two) times daily as needed for heartburn or indigestion. 01/20/21   Binnie Rail, MD  ferrous sulfate 325 (65 FE) MG EC tablet Take 1 tablet (325 mg total) by mouth every other day. 09/28/21   Pyrtle, Lajuan Lines, MD  Flaxseed MISC Take 5 mLs by mouth daily. Take 1 tsp daily    [provider]  furosemide (LASIX) 20 MG tablet Take 40 mg (two tablets) for two days; then take 20 mg (one tablet) each day. Patient taking differently: Take 20 mg by mouth daily.  04/04/21   [name], MD  Glucos-Chond-Hyal Ac-Ca Fructo (MOVE FREE JOINT HEALTH ADVANCE PO) Take by mouth.    [provider]  lidocaine (LMX) 4 % cream Apply 1 application topically as needed.    [provider]  Magnesium Oxide 250 MG TABS Take 1 tablet by mouth. 09/22/09   [provider]  metoprolol succinate (TOPROL-XL) 25 MG 24 hr tablet TAKE 1/2 (ONE-HALF) TABLET BY MOUTH ONCE DAILY WITH OR IMMEDIATELY FOLLOWING A MEAL 09/16/21   [name], MD  Multiple Vitamin (MULTIVITAMIN) tablet Take 1 tablet by mouth daily.      [provider]  polyethylene glycol (MIRALAX / GLYCOLAX) packet Take 17 g by mouth daily as needed for moderate constipation.     [provider]  predniSONE (DELTASONE) 2.5 MG tablet Take 1 tablet (2.5 mg total) by mouth every other day. 02/23/21   Binnie Rail, MD  Probiotic Product (PROBIOTIC DAILY PO) Take 1 tablet by mouth daily with breakfast.    [provider]  PSYLLIUM PO Take 1 tablet by mouth every other day.    [provider]  triamcinolone cream (KENALOG) 0.1 % SMARTSIG:2 Topical Twice Daily 04/12/21   [provider]  trolamine salicylate (ASPERCREME) 10 % cream Apply 1 application topically as needed for muscle pain.  [provider]  metoprolol succinate (TOPROL-XL) 25 MG 24 hr tablet Take 1 tablet (25 mg total) by mouth daily. 01/06/21   Patrecia Pour, MD      Allergies    Aspirin, Sulfa antibiotics, Contrast media [iodinated contrast media], Shellfish-derived products, Eliquis [apixaban], Fluoxetine, Sulfamethoxazole-trimethoprim, Celebrex [celecoxib], Celexa [citalopram], and Statins    Review of Systems   Review of Systems  Physical Exam Updated Vital Signs BP (!) 183/70   Pulse 79   Temp 98 F (36.7 C) (Oral)   Resp 18   SpO2 97%  Physical Exam Vitals and nursing note reviewed.  Constitutional:      General: She is not in acute distress.     Appearance: She is well-developed. She is not ill-appearing.  HENT:     Head: Normocephalic and atraumatic.     Mouth/Throat:     Mouth: Mucous membranes are moist.  Eyes:     Extraocular Movements: Extraocular movements intact.     Conjunctiva/sclera: Conjunctivae normal.     Pupils: Pupils are equal, round, and reactive to light.  Cardiovascular:     Rate and Rhythm: Normal rate and regular rhythm.     Heart sounds: No murmur heard. Pulmonary:     Effort: Pulmonary effort is normal. No respiratory distress.     Breath sounds: Normal breath sounds.  Abdominal:     General: There is distension.     Palpations: Abdomen is soft.     Tenderness: There is no abdominal tenderness.  Musculoskeletal:        General: No swelling.     Cervical back: Neck supple.  Skin:    General: Skin is warm and dry.     Capillary Refill: Capillary refill takes less than 2 seconds.  Neurological:     Mental Status: She is alert.  Psychiatric:        Mood and Affect: Mood normal.     ED Results / Procedures / Treatments   Labs (all labs ordered are listed, but only abnormal results are displayed) Labs Reviewed  CBC WITH DIFFERENTIAL/PLATELET - Abnormal; Notable for the following components:      Result Value   WBC 11.1 (*)    RBC 5.31 (*)    RDW 15.6 (*)    All other components within normal limits  COMPREHENSIVE METABOLIC PANEL - Abnormal; Notable for the following components:   Glucose, Bld 110 (*)    Total Protein 8.7 (*)    All other components within normal limits  URINALYSIS, ROUTINE W REFLEX MICROSCOPIC - Abnormal; Notable for the following components:   Color, Urine STRAW (*)    All other components within normal limits  LIPASE, BLOOD    EKG None  Radiology CT Abdomen Pelvis Wo Contrast  Result Date: 11/16/2021 CLINICAL DATA:  Acute abdominal pain. EXAM: CT ABDOMEN AND PELVIS WITHOUT CONTRAST TECHNIQUE: Multidetector CT imaging of the abdomen and pelvis was performed  following the standard protocol without IV contrast. RADIATION DOSE REDUCTION: This exam was performed according to the departmental dose-optimization program which includes automated exposure control, adjustment of the mA and/or kV according to patient size and/or use of iterative reconstruction technique. COMPARISON:  PET-CT 08/01/2021. CT abdomen and pelvis 09/20/2017. MRI pelvis 03/04/2020. FINDINGS: Lower chest: Ill-defined nodular density in the right lung base is grossly unchanged compared to prior PET-CT. Lung bases are otherwise clear. Hepatobiliary: No focal liver abnormality is seen. Status post cholecystectomy. No biliary dilatation. Pancreas: Unremarkable. No pancreatic ductal dilatation or surrounding  inflammatory changes. Spleen: Not seen, unchanged. Adrenals/Urinary Tract: Adrenal glands are unremarkable. Kidneys are normal, without renal calculi, focal lesion, or hydronephrosis. Bladder is unremarkable. Stomach/Bowel: Stomach is within normal limits. No evidence of bowel wall thickening, distention, or inflammatory changes. There are scattered colonic diverticula. The appendix is not seen. Sigmoid colon anastomosis is present. Vascular/Lymphatic: Aortic atherosclerosis. Mildly enlarged right inguinal lymph node measures 1 cm, unchanged. No new enlarged lymph nodes are seen. Reproductive: There is a 4.6 by 3.8 cm left adnexal cystic structure which appears unchanged. Uterus is surgically absent. Right adnexa is unremarkable. Other: No abdominal wall hernia or abnormality. No abdominopelvic ascites. Musculoskeletal: Degenerative changes affect the spine and hips. No acute fractures. IMPRESSION: 1. No acute localizing process in the abdomen or pelvis. 2. Unchanged ill-defined nodular density in the right lung base. 3. Other stable chronic findings as above. Electronically Signed   By: Ronney Asters M.D.   On: 11/16/2021 21:19    Procedures Procedures    Medications Ordered in ED Medications - No  data to display  ED Course/ Medical Decision Making/ A&P                           Medical Decision Making  Tamara Robertson is here with abdominal pain.  Differential diagnosis is bowel obstruction versus constipation versus less likely UTI or other acute process.  She is overall very well-appearing.  Has had multiple abdominal surgeries.  She has been constipated.  We will get CBC, CMP, urinalysis, CT scan abdomen pelvis.  Per my review and interpretation of labs there is no significant anemia, electrolyte abnormality, kidney injury, leukocytosis.  CT scan with no acute findings.  Overall suspect constipation/gas related pain.  Given reassurance and discharged in the ED in good condition.        Final Clinical Impression(s) / ED Diagnoses Final diagnoses:  Generalized abdominal pain    Rx / DC Orders ED Discharge Orders     None         Lennice Sites, DO 11/16/21 2200

## 2021-11-17 ENCOUNTER — Ambulatory Visit: Payer: Medicare Other | Admitting: Family Medicine

## 2021-11-21 ENCOUNTER — Ambulatory Visit: Payer: Medicare Other | Admitting: Podiatry

## 2021-11-21 ENCOUNTER — Telehealth: Payer: Self-pay | Admitting: Internal Medicine

## 2021-11-22 ENCOUNTER — Encounter: Payer: Self-pay | Admitting: Internal Medicine

## 2021-11-24 ENCOUNTER — Encounter: Payer: Self-pay | Admitting: Internal Medicine

## 2021-11-30 NOTE — Progress Notes (Signed)
Subjective:    Patient ID: Tamara Robertson, female    DOB: 1930/01/10, 88 y.o.   MRN: [id number]      HPI [name] is here for No chief complaint on file.  Constipation-earlier this month she was very constipated which she knows is related to taking iron-she was on it for 2 weeks.  She did end up taking Senokot and that helped, but she was still constipated.  She ended up taking only helped a little.  After increasing fluids and starting Metamucil she eventually got back to normal.  She is not currently taking any iron.  She has been experiencing left-sided pain that radiates towards her groin.  She does not feel it at rest and only feels it with certain movements or changes in position.  She does have a history of spinal stenosis in the lumbar region with neurogenic claudication.  She is not having that type of pain.  She was unsure if the pain was related to her constipation or hip.  She continues to have shortness of breath with minimal activity.  Can walk across house and can get SOB, sometimes with bending.  She feels this shortness of breath is more been going on for the past 3-4 weeks.  She does experience it daily.  She denies any palpitations, chest pain.  She denies any orthopnea or PND.  BP 117-122/60, last night 132/67, 110/60 today,    Staying hydrated    Medications and allergies reviewed with patient and updated if appropriate.  Current Outpatient Medications on File Prior to Visit  Medication Sig Dispense Refill   acetaminophen (TYLENOL) 500 MG tablet Take 500 mg by mouth every 6 (six) hours as needed.     Biotin 2500 MCG CAPS Take 1 capsule by mouth daily.      calcium carbonate (TUMS - DOSED IN MG ELEMENTAL CALCIUM) 500 MG chewable tablet Chew 1 tablet by mouth daily as needed for indigestion or heartburn.     cholecalciferol (VITAMIN D3) 25 MCG (1000 UNIT) tablet Take 1,000 Units by mouth daily.     escitalopram (LEXAPRO) 5 MG tablet Take 1 tablet (5 mg total) by mouth  daily. (Patient taking differently: Take 2.5 mg by mouth daily.) 90 tablet 3   famotidine (PEPCID) 20 MG tablet Take 1 tablet (20 mg total) by mouth 2 (two) times daily as needed for heartburn or indigestion. 180 tablet 3   Flaxseed MISC Take 5 mLs by mouth daily. Take 1 tsp daily     furosemide (LASIX) 20 MG tablet Take 40 mg (two tablets) for two days; then take 20 mg (one tablet) each day. (Patient taking differently: Take 20 mg by mouth daily.) 90 tablet 3   Glucos-Chond-Hyal Ac-Ca Fructo (MOVE FREE JOINT HEALTH ADVANCE PO) Take by mouth.     lidocaine (LMX) 4 % cream Apply 1 application topically as needed.     Magnesium Oxide 250 MG TABS Take 1 tablet by mouth.     metoprolol succinate (TOPROL-XL) 25 MG 24 hr tablet TAKE 1/2 (ONE-HALF) TABLET BY MOUTH ONCE DAILY WITH OR IMMEDIATELY FOLLOWING A MEAL 45 tablet 10   Multiple Vitamin (MULTIVITAMIN) tablet Take 1 tablet by mouth daily.       polyethylene glycol (MIRALAX / GLYCOLAX) packet Take 17 g by mouth daily as needed for moderate constipation.      predniSONE (DELTASONE) 2.5 MG tablet Take 1 tablet (2.5 mg total) by mouth every other day. 45 tablet 3   Probiotic Product (  PROBIOTIC DAILY PO) Take 1 tablet by mouth daily with breakfast.     PSYLLIUM PO Take 1 tablet by mouth every other day.     triamcinolone cream (KENALOG) 0.1 % SMARTSIG:2 Topical Twice Daily     trolamine salicylate (ASPERCREME) 10 % cream Apply 1 application topically as needed for muscle pain.     [DISCONTINUED] metoprolol succinate (TOPROL-XL) 25 MG 24 hr tablet Take 1 tablet (25 mg total) by mouth daily. 30 tablet 1   No current facility-administered medications on file prior to visit.    Review of Systems  Respiratory:  Positive for cough and shortness of breath. Negative for wheezing.   Cardiovascular:  Negative for chest pain, palpitations and leg swelling.  Neurological:  Positive for light-headedness.       Objective:   Vitals:   12/01/21 1445  BP:  134/62  Pulse: 76  Temp: 97.6 F (36.4 C)  SpO2: 98%   BP Readings from Last 3 Encounters:  12/01/21 134/62  11/16/21 (!) 189/68  10/31/21 (!) 149/65   Wt Readings from Last 3 Encounters:  12/01/21 146 lb (66.2 kg)  10/31/21 150 lb 3.2 oz (68.1 kg)  09/25/21 149 lb 12.8 oz (67.9 kg)   Body mass index is 27.59 kg/m.    Physical Exam Constitutional:      General: She is not in acute distress.    Appearance: Normal appearance.  HENT:     Head: Normocephalic and atraumatic.  Eyes:     Conjunctiva/sclera: Conjunctivae normal.  Cardiovascular:     Rate and Rhythm: Normal rate and regular rhythm.     Heart sounds: Normal heart sounds. No murmur heard. Pulmonary:     Effort: Pulmonary effort is normal. No respiratory distress.     Breath sounds: Normal breath sounds. No wheezing.  Musculoskeletal:     Cervical back: Neck supple.     Right lower leg: Edema (mild) present.     Left lower leg: No edema.  Lymphadenopathy:     Cervical: No cervical adenopathy.  Skin:    General: Skin is warm and dry.     Findings: No rash.  Neurological:     Mental Status: She is alert. Mental status is at baseline.  Psychiatric:        Mood and Affect: Mood normal.        Behavior: Behavior normal.            Assessment & Plan:    See Problem List for Assessment and Plan of chronic medical problems.

## 2021-12-01 ENCOUNTER — Ambulatory Visit (INDEPENDENT_AMBULATORY_CARE_PROVIDER_SITE_OTHER): Payer: Medicare Other | Admitting: Internal Medicine

## 2021-12-01 ENCOUNTER — Encounter: Payer: Self-pay | Admitting: Internal Medicine

## 2021-12-01 DIAGNOSIS — F419 Anxiety disorder, unspecified: Secondary | ICD-10-CM

## 2021-12-01 DIAGNOSIS — D649 Anemia, unspecified: Secondary | ICD-10-CM | POA: Diagnosis not present

## 2021-12-01 DIAGNOSIS — I10 Essential (primary) hypertension: Secondary | ICD-10-CM | POA: Diagnosis not present

## 2021-12-01 DIAGNOSIS — M25552 Pain in left hip: Secondary | ICD-10-CM

## 2021-12-01 DIAGNOSIS — F32A Depression, unspecified: Secondary | ICD-10-CM

## 2021-12-01 DIAGNOSIS — R0602 Shortness of breath: Secondary | ICD-10-CM | POA: Diagnosis not present

## 2021-12-01 NOTE — Patient Instructions (Addendum)
     Go to [facility] for your side pain.     Medications changes include :   none

## 2021-12-01 NOTE — Assessment & Plan Note (Signed)
Had some mild anemia with some iron deficiency at times She did not tolerate oral iron-gave her significant constipation She has follow-up with GI scheduled so we will let them reevaluate-they had discussed iron infusions

## 2021-12-01 NOTE — Assessment & Plan Note (Signed)
Subacute Has been experiencing some shortness of breath for the past 4 weeks or so-she did discuss with cardiology when she saw him last month Shortness of breath is daily Can occur with exertion or bending No orthopnea or PND No chest pain or palpitations She is unsure if she is ever in A-fib or not She is also fatigued, which is not new Difficult to know exactly what her shortness of breath and fatigue are from Has seen pulmonary.  She plans on calling cardiology to discuss with them again Will have anemia reevaluated when she sees GI we will hold off on blood work today

## 2021-12-01 NOTE — Assessment & Plan Note (Signed)
Chronic Controlled, Stable Continue Lexapro 2.5 mg daily

## 2021-12-01 NOTE — Assessment & Plan Note (Signed)
Acute she grabs onto her left side and states she has pain near Only occurs when she changes position or moves certain way ?  Related to her back-she does have known spinal stenosis and has had some radiculopathy in the past She is taking Tylenol as needed She will see her orthopedic for further evaluation and treatment

## 2021-12-01 NOTE — Assessment & Plan Note (Signed)
Chronic Blood pressure fairly controlled-Home has slightly low, but not low enough to change her medication  Continue metoprolol 12.5 mg daily

## 2021-12-04 ENCOUNTER — Ambulatory Visit: Payer: Medicare Other | Admitting: Internal Medicine

## 2021-12-11 ENCOUNTER — Encounter: Payer: Self-pay | Admitting: Podiatry

## 2021-12-11 ENCOUNTER — Ambulatory Visit (INDEPENDENT_AMBULATORY_CARE_PROVIDER_SITE_OTHER): Payer: Medicare Other | Admitting: Podiatry

## 2021-12-11 DIAGNOSIS — B351 Tinea unguium: Secondary | ICD-10-CM

## 2021-12-11 DIAGNOSIS — M79674 Pain in right toe(s): Secondary | ICD-10-CM | POA: Diagnosis not present

## 2021-12-11 DIAGNOSIS — M79675 Pain in left toe(s): Secondary | ICD-10-CM | POA: Diagnosis not present

## 2021-12-11 DIAGNOSIS — I739 Peripheral vascular disease, unspecified: Secondary | ICD-10-CM

## 2021-12-11 NOTE — Progress Notes (Signed)
This patient returns to my office for at risk foot care.  This patient requires this care by a professional since this patient will be at risk due to having thrombocytopenia, peripheral neuropathy and pvd. This patient is unable to cut nails himself since the patient cannot reach his nails.These nails are painful walking and wearing shoes.  This patient presents for at risk foot care today.  General Appearance  Alert, conversant and in no acute stress.  Vascular  Dorsalis pedis and posterior tibial  pulses are  weakly palpable  bilaterally.  Capillary return is within normal limits  bilaterally. Temperature is within normal limits  bilaterally.  Neurologic  Senn-Weinstein monofilament wire test within normal limits  bilaterally. Muscle power within normal limits bilaterally.  Nails Thick disfigured discolored nails with subungual debris  from hallux to fifth toes bilaterally. No evidence of bacterial infection or drainage bilaterally.  Orthopedic  No limitations of motion  feet .  No crepitus or effusions noted.  No bony pathology or digital deformities noted.  Skin  normotropic skin with no porokeratosis noted bilaterally.  No signs of infections or ulcers noted.     Onychomycosis  Pain in right toes  Pain in left toes  Consent was obtained for treatment procedures.   Mechanical debridement of nails 1-5  bilaterally performed with a nail nipper.  Filed with dremel without incident.    Return office visit    8 weeks                  Told patient to return for periodic foot care and evaluation due to potential at risk complications.   My [place] DPM   

## 2021-12-21 ENCOUNTER — Other Ambulatory Visit: Payer: Self-pay | Admitting: Internal Medicine

## 2021-12-21 DIAGNOSIS — Z1231 Encounter for screening mammogram for malignant neoplasm of breast: Secondary | ICD-10-CM

## 2021-12-27 ENCOUNTER — Other Ambulatory Visit (INDEPENDENT_AMBULATORY_CARE_PROVIDER_SITE_OTHER): Payer: Medicare Other

## 2021-12-27 DIAGNOSIS — K921 Melena: Secondary | ICD-10-CM | POA: Diagnosis not present

## 2021-12-27 LAB — CBC
HCT: 37.4 % (ref 36.0–46.0)
Hemoglobin: 12.3 g/dL (ref 12.0–15.0)
MCHC: 32.9 g/dL (ref 30.0–36.0)
MCV: 82.8 fl (ref 78.0–100.0)
Platelets: 150 10*3/uL (ref 150.0–400.0)
RBC: 4.52 Mil/uL (ref 3.87–5.11)
RDW: 15.7 % — ABNORMAL HIGH (ref 11.5–15.5)
WBC: 10.1 10*3/uL (ref 4.0–10.5)

## 2021-12-27 LAB — IBC + FERRITIN
Ferritin: 23.6 ng/mL (ref 10.0–291.0)
Iron: 64 ug/dL (ref 42–145)
Saturation Ratios: 15.7 % — ABNORMAL LOW (ref 20.0–50.0)
TIBC: 407.4 ug/dL (ref 250.0–450.0)
Transferrin: 291 mg/dL (ref 212.0–360.0)

## 2021-12-29 ENCOUNTER — Encounter: Payer: Self-pay | Admitting: *Deleted

## 2022-01-02 ENCOUNTER — Encounter: Payer: Self-pay | Admitting: Internal Medicine

## 2022-01-02 ENCOUNTER — Ambulatory Visit (INDEPENDENT_AMBULATORY_CARE_PROVIDER_SITE_OTHER): Payer: Medicare Other | Admitting: Internal Medicine

## 2022-01-02 VITALS — BP 116/52 | HR 73 | Ht 61.5 in | Wt 148.4 lb

## 2022-01-02 DIAGNOSIS — E611 Iron deficiency: Secondary | ICD-10-CM

## 2022-01-02 DIAGNOSIS — K5900 Constipation, unspecified: Secondary | ICD-10-CM

## 2022-01-02 NOTE — Patient Instructions (Signed)
_______________________________________________________  If you are age 86 or older, your body mass index should be between 23-30. Your Body mass index is 27.58 kg/m. If this is out of the aforementioned range listed, please consider follow up with your Primary Care Provider.  If you are age 68 or younger, your body mass index should be between 19-25. Your Body mass index is 27.58 kg/m. If this is out of the aformentioned range listed, please consider follow up with your Primary Care Provider.   Continue Miralax daily.   Follow up early November.   The [facility] providers would like to encourage you to use [facility] to communicate with providers for non-urgent requests or questions.  Due to long hold times on the telephone, sending your provider a message by [facility] may be a faster and more efficient way to get a response.  Please allow 48 business hours for a response.  Please remember that this is for non-urgent requests.   It was a pleasure to see you today!  Thank you for trusting me with your gastrointestinal care!

## 2022-01-02 NOTE — Progress Notes (Signed)
Subjective:    Patient ID: Tamara Robertson, female    DOB: 1929/09/06, 88 y.o.   MRN: [id number]  HPI Tamara Robertson is a 88 year old female with a history of GERD, IBS with chronic constipation, history of complicated diverticular disease requiring sigmoidectomy in 2007 for stricture, prior SBO felt secondary to adhesive disease, peripheral vascular disease, history of ITP, prior cholecystectomy and splenectomy, recent diagnosis of lung cancer on observation who is seen for follow-up.  She is here alone today and I last saw her on 09/25/2021 to evaluate an episode of melenic heme positive stool.  She reports that she has been feeling better.  She tolerated the oral iron extremely poorly.  Had abdominal pain and severe constipation.  It also dark in her stool.  She has been less constipated of late using MiraLAX 17 g daily.  The last 4 days her bowel movements have been much better.  She tried some prune juice but got diarrhea and now is just using hot coffee in addition to the MiraLAX.  She is trying to eat a higher iron rich diet.  She has not had abdominal pain and seen no further black stool.  She does still feel tired and sleepy during the day.  She intermittently uses Pepcid which seems to help cough so she is not having much heartburn.   Review of Systems As per HPI, otherwise negative  Current Medications, Allergies, Past Medical History, Past Surgical History, Family History and Social History were reviewed in [employer] record.     Objective:   Physical Exam BP (!) 116/52   Pulse 73   Ht 5' 1.5" (1.562 m)   Wt 148 lb 6 oz (67.3 kg)   BMI 27.58 kg/m  Gen: awake, alert, NAD HEENT: anicteric  CV: RRR, no mrg Pulm: CTA b/l Abd: soft, NT/ND, +BS throughout Ext: no c/c/e Neuro: nonfocal     Latest Ref Rng & Units 12/27/2021    3:53 PM 11/16/2021    3:32 PM 10/13/2021    4:20 PM  CBC  WBC 4.0 - 10.5 K/uL 10.1  11.1  9.3   Hemoglobin 12.0 - 15.0 g/dL 09.3  23.5   57.3   Hematocrit 36.0 - 46.0 % 37.4  43.3  39.0   Platelets 150.0 - 400.0 K/uL 150.0  205  146.0    Iron/TIBC/Ferritin/ %Sat    Component Value Date/Time   IRON 64 12/27/2021 1553   TIBC 407.4 12/27/2021 1553   FERRITIN 23.6 12/27/2021 1553   IRONPCTSAT 15.7 (L) 12/27/2021 1553   CT ABDOMEN AND PELVIS WITHOUT CONTRAST   TECHNIQUE: Multidetector CT imaging of the abdomen and pelvis was performed following the standard protocol without IV contrast.   RADIATION DOSE REDUCTION: This exam was performed according to the departmental dose-optimization program which includes automated exposure control, adjustment of the mA and/or kV according to patient size and/or use of iterative reconstruction technique.   COMPARISON:  PET-CT 08/01/2021. CT abdomen and pelvis 09/20/2017. MRI pelvis 03/04/2020.   FINDINGS: Lower chest: Ill-defined nodular density in the right lung base is grossly unchanged compared to prior PET-CT. Lung bases are otherwise clear.   Hepatobiliary: No focal liver abnormality is seen. Status post cholecystectomy. No biliary dilatation.   Pancreas: Unremarkable. No pancreatic ductal dilatation or surrounding inflammatory changes.   Spleen: Not seen, unchanged.   Adrenals/Urinary Tract: Adrenal glands are unremarkable. Kidneys are normal, without renal calculi, focal lesion, or hydronephrosis. Bladder is unremarkable.   Stomach/Bowel: Stomach is  within normal limits. No evidence of bowel wall thickening, distention, or inflammatory changes. There are scattered colonic diverticula. The appendix is not seen. Sigmoid colon anastomosis is present.   Vascular/Lymphatic: Aortic atherosclerosis. Mildly enlarged right inguinal lymph node measures 1 cm, unchanged. No new enlarged lymph nodes are seen.   Reproductive: There is a 4.6 by 3.8 cm left adnexal cystic structure which appears unchanged. Uterus is surgically absent. Right adnexa is unremarkable.   Other:  No abdominal wall hernia or abnormality. No abdominopelvic ascites.   Musculoskeletal: Degenerative changes affect the spine and hips. No acute fractures.   IMPRESSION: 1. No acute localizing process in the abdomen or pelvis. 2. Unchanged ill-defined nodular density in the right lung base. 3. Other stable chronic findings as above.     Electronically Signed   By: Darliss Cheney M.D.   On: 11/16/2021 21:19        Assessment & Plan:  88 year old female with a history of GERD, IBS with chronic constipation, history of complicated diverticular disease requiring sigmoidectomy in 2007 for stricture, prior SBO felt secondary to adhesive disease, peripheral vascular disease, history of ITP, prior cholecystectomy and splenectomy, recent diagnosis of lung cancer on observation who is seen for follow-up.   Previous isolated melena/borderline iron studies --I did iron studies recently and she is not anemic.  Her iron studies are simply borderline.  Her ferritin is on the low end of normal and her percent saturation is slightly low.  She is not anemic.  She cannot tolerate oral iron and I will have a low threshold for IV iron -- Repeat CBC, IBC plus ferritin on November 1 -- She is asked to call me immediately if she develops another black stool or bloody stool -- Low threshold for IV iron  2.  Constipation --doing better with MiraLAX on a daily basis -- Continue daily MiraLAX  3.  Intermittent cough --seems to get better with Pepcid which she is using as needed.  Can continue with this regimen.  No nausea or melena.  4.  Probable lung cancer --follow-up with Dr. Delton Coombes next month after repeat imaging.  On observation for now.  Recently reassuring CT scan of the abdomen pelvis I will see her in November  30 minutes total spent today including patient facing time, coordination of care, reviewing medical history/procedures/pertinent radiology studies, and documentation of the encounter.

## 2022-01-03 ENCOUNTER — Other Ambulatory Visit: Payer: Self-pay

## 2022-01-03 DIAGNOSIS — E611 Iron deficiency: Secondary | ICD-10-CM

## 2022-01-03 NOTE — Progress Notes (Signed)
Lab orders and reminder in for labs on 03/07/22.

## 2022-01-10 ENCOUNTER — Telehealth: Payer: Medicare Other

## 2022-01-22 ENCOUNTER — Other Ambulatory Visit (HOSPITAL_COMMUNITY): Payer: Medicare Other

## 2022-01-23 ENCOUNTER — Ambulatory Visit (HOSPITAL_COMMUNITY)
Admission: RE | Admit: 2022-01-23 | Discharge: 2022-01-23 | Disposition: A | Discharge status: Home / Self Care | Payer: Medicare Other | Source: Ambulatory Visit | Attending: Emergency Medicine | Admitting: Emergency Medicine

## 2022-01-23 DIAGNOSIS — R9389 Abnormal findings on diagnostic imaging of other specified body structures: Secondary | ICD-10-CM | POA: Insufficient documentation

## 2022-01-30 ENCOUNTER — Ambulatory Visit (INDEPENDENT_AMBULATORY_CARE_PROVIDER_SITE_OTHER): Payer: Medicare Other | Admitting: Emergency Medicine

## 2022-01-30 ENCOUNTER — Encounter: Payer: Self-pay | Admitting: Emergency Medicine

## 2022-01-30 DIAGNOSIS — R9389 Abnormal findings on diagnostic imaging of other specified body structures: Secondary | ICD-10-CM

## 2022-01-30 NOTE — Addendum Note (Signed)
Addended by: Rosana Berger on: 01/30/2022 04:26 PM   Modules accepted: Orders

## 2022-01-30 NOTE — Patient Instructions (Addendum)
We reviewed your CT scan of the chest today. We will plan to repeat a CT chest with contrast in March 2024.  You will need lab work before that scan and we will arrange. Follow Dr. Lamonte Sakai in March after your CT so we can review the results together.  Call sooner if you have any problems.

## 2022-01-30 NOTE — Progress Notes (Signed)
Subjective:    Patient ID: Tamara Robertson, female    DOB: Aug 06, 1929, 88 y.o.   MRN: [id number]  HPI  ROV 08/02/21 --follow-up visit for 88 year old woman for an abnormal CT scan of the chest and shortness of breath.  I saw her earlier this month after she had a CT chest in February that revealed enlargement of irregular subsolid nodular opacity in the right upper lobe, irregular subsolid medial right lower lobe opacity.  We performed PET scan to try to better characterize.  PET scan from 08/01/2021 reviewed by me, shows that the right upper lobe subsolid nodule does have hypermetabolism.  Small left upper lobe groundglass nodules up to 5 mm.trace metabolic activity in stable LLL opacity, ? Scar   ROV 01/30/22 --Tamara Robertson is 88 years old, follows up today for abnormal CT scan of the chest.  She has an irregular subsolid right upper lobe opacity and a subsolid medial right lower lobe opacity.  There is hypermetabolism in the right upper lobe.  There is also small left upper lobe groundglass nodules, trace activity and a stable left lower lobe opacity.  We had decided to be conservative and follow with CT imaging.  She has been uncertain as to whether she would want to undergo diagnostics or treatment.  CT chest was done 01/23/2022 She has cough that can happen at random. Has responded to pepcid in the past but she is having breakthrough reflux.   Super D CT chest 01/23/2022 reviewed by me shows stability of her scattered pulmonary nodules, superior segmental left lower lobe scar.  The spiculated mixed density right upper lobe nodule is slightly more contracted and solid in appearance, again consistent with highly differentiated adenocarcinoma.   Review of Systems As per HPI  Past Medical History:  Diagnosis Date   Achilles tendonitis    Anxiety    Aortic atherosclerosis (HCC)    CAD (coronary artery disease)    RCA 40% stenosis cath 01/2011   Diverticular stricture [facility]) 2006   [facility]   DIVERTICULITIS, HX OF    Diverticulosis    DYSLIPIDEMIA    Elevated LFTs    GERD    Glaucoma    Hepatic steatosis    HOH (hard of hearing)    Immune thrombocytopenic purpura (HCC)    chronic - baseline 80-100K, on pred   Irritable bowel syndrome    Left ovarian cyst dx 01/2013 CT   working with gyn, ?malignant - elevated tumor marker OVA1   OSTEOARTHRITIS, KNEE, RIGHT    OSTEOPENIA    OVERACTIVE BLADDER    Plantar fasciitis    Prolapse of female bladder, acquired 11/2017   Torn rotator cuff    right shoulder   URINARY INCONTINENCE      Family History  Problem Relation Age of Onset   Coronary artery disease Mother    Heart attack Mother 43   Hyperlipidemia Mother    Hypertension Mother    Stomach cancer Father    Hypertension Daughter    Hyperlipidemia Daughter    Arthritis Other        parent   Transient ischemic attack Other        parent     Social History   Socioeconomic History   Marital status: Widowed    Spouse name: Not on file   Number of children: 2   Years of education: Not on file   Highest education level: Not on file  Occupational History   Occupation:  Retired    Comment: Programmer, multimedia  Tobacco Use   Smoking status: Never   Smokeless tobacco: Never  Vaping Use   Vaping Use: Never used  Substance and Sexual Activity   Alcohol use: No   Drug use: No   Sexual activity: Not Currently    Birth control/protection: Surgical    Comment: 1st intercourse- 19, partners- 2, hysterectomy  Other Topics Concern   Not on file  Social History Narrative   Retired Futures trader.    [place] to [place] from [place] 05/2010 to be close to kids   Social Determinants of Health   Financial Resource Strain: Low Risk  (08/30/2021)   Overall Financial Resource Strain (CARDIA)    Difficulty of Paying Living Expenses: Not hard at all  Food Insecurity: No Food Insecurity (08/30/2021)   Hunger Vital Sign    Worried About Running Out of Food  in the Last Year: Never true    Ran Out of Food in the Last Year: Never true  Transportation Needs: No Transportation Needs (08/30/2021)   PRAPARE - Administrator, Civil Service (Medical): No    Lack of Transportation (Non-Medical): No  Physical Activity: Inactive (08/05/2020)   Exercise Vital Sign    Days of Exercise per Week: 0 days    Minutes of Exercise per Session: 0 min  Stress: No Stress Concern Present (08/30/2021)   [employer] of Occupational Health - Occupational Stress Questionnaire    Feeling of Stress : Not at all  Social Connections: Socially Isolated (08/30/2021)   Social Connection and Isolation Panel [NHANES]    Frequency of Communication with Friends and Family: More than three times a week    Frequency of Social Gatherings with Friends and Family: Once a week    Attends Religious Services: Never    Database administrator or Organizations: No    Attends Banker Meetings: Never    Marital Status: Widowed  Intimate Partner Violence: Not At Risk (08/30/2021)   Humiliation, Afraid, Rape, and Kick questionnaire    Fear of Current or Ex-Partner: No    Emotionally Abused: No    Physically Abused: No    Sexually Abused: No     Allergies  Allergen Reactions   Aspirin Other (See Comments)    ITP   Sulfa Antibiotics Other (See Comments)    dizziness   Contrast Media [Iodinated Contrast Media] Hives    CAT scan contrast only   Shellfish-Derived Products Hives    Other reaction(s): Other   Eliquis [Apixaban]     Rash    Fluoxetine     Did not feel well on it    Sulfamethoxazole-Trimethoprim     Other reaction(s): Unknown   Celebrex [Celecoxib] Other (See Comments)    Leg swelling   Celexa [Citalopram] Other (See Comments)    Felt out of it   Statins Other (See Comments)     Outpatient Medications Prior to Visit  Medication Sig Dispense Refill   acetaminophen (TYLENOL) 500 MG tablet Take 500 mg by mouth every 6 (six) hours as  needed.     Biotin 2500 MCG CAPS Take 1 capsule by mouth daily.      calcium carbonate (TUMS - DOSED IN MG ELEMENTAL CALCIUM) 500 MG chewable tablet Chew 1 tablet by mouth daily as needed for indigestion or heartburn.     cholecalciferol (VITAMIN D3) 25 MCG (1000 UNIT) tablet Take 1,000 Units by mouth daily.  escitalopram (LEXAPRO) 5 MG tablet Take 1 tablet (5 mg total) by mouth daily. (Patient taking differently: Take 2.5 mg by mouth daily.) 90 tablet 3   famotidine (PEPCID) 20 MG tablet Take 1 tablet (20 mg total) by mouth 2 (two) times daily as needed for heartburn or indigestion. 180 tablet 3   Flaxseed MISC Take 5 mLs by mouth daily. Take 1 tsp daily     furosemide (LASIX) 20 MG tablet Take 40 mg (two tablets) for two days; then take 20 mg (one tablet) each day. (Patient taking differently: Take 20 mg by mouth daily.) 90 tablet 3   Glucos-Chond-Hyal Ac-Ca Fructo (MOVE FREE JOINT HEALTH ADVANCE PO) Take by mouth.     lidocaine (LMX) 4 % cream Apply 1 application topically as needed.     Magnesium Oxide 250 MG TABS Take 1 tablet by mouth.     metoprolol succinate (TOPROL-XL) 25 MG 24 hr tablet TAKE 1/2 (ONE-HALF) TABLET BY MOUTH ONCE DAILY WITH OR IMMEDIATELY FOLLOWING A MEAL 45 tablet 10   Multiple Vitamin (MULTIVITAMIN) tablet Take 1 tablet by mouth daily.       polyethylene glycol (MIRALAX / GLYCOLAX) packet Take 17 g by mouth daily as needed for moderate constipation.      predniSONE (DELTASONE) 2.5 MG tablet Take 1 tablet (2.5 mg total) by mouth every other day. 45 tablet 3   Probiotic Product (PROBIOTIC DAILY PO) Take 1 tablet by mouth daily with breakfast.     PSYLLIUM PO Take 1 tablet by mouth every other day.     triamcinolone cream (KENALOG) 0.1 % SMARTSIG:2 Topical Twice Daily     trolamine salicylate (ASPERCREME) 10 % cream Apply 1 application topically as needed for muscle pain.     No facility-administered medications prior to visit.         Objective:   Physical  Exam Vitals:   01/30/22 1528  BP: 136/60  Pulse: 79  Temp: 98 F (36.7 C)  TempSrc: Oral  SpO2: 99%  Weight: 149 lb 12.8 oz (67.9 kg)  Height: 5' 1.5" (1.562 m)   Gen: Pleasant, elderly woman, in no distress,  normal affect  ENT: No lesions,  mouth clear,  oropharynx clear, no postnasal drip  Neck: No JVD, no stridor  Lungs: No use of accessory muscles, no crackles or wheezing on normal respiration, no wheeze on forced expiration  Cardiovascular: RRR, heart sounds normal, no murmur or gallops, trace peripheral edema  Musculoskeletal: No deformities, no cyanosis or clubbing  Neuro: alert, awake, non focal  Skin: Warm, no lesions or rash       Assessment & Plan:   Abnormal CT of the chest We have been following multiple hazy nodules, some left-sided scar and a mixed density right upper lobe pulmonary nodule that is concerning for highly differentiated adenocarcinoma.  That lesion is slightly more solid on the most recent scan.  We discussed this today.  She has wanted to be conservative, did ask about the risks for possible metastatic spread.  I did explain that this could happen if we continue to follow.  We talked about possible bronchoscopy, possible referral for empiric XRT depending on how the nodule evolves.  I will repeat her scan in 6 months, plan to do this with contrast to evaluate for lymphadenopathy.  We will follow-up after that scan to decide next steps in work-up.  Time spent 30 minutes  Levy Pupa, MD, PhD 01/30/2022, 4:22 PM [facility] [phone] or if  no answer before 7:00PM call [phone] For any issues after 7:00PM please call eLink [phone]

## 2022-01-30 NOTE — Assessment & Plan Note (Signed)
We have been following multiple hazy nodules, some left-sided scar and a mixed density right upper lobe pulmonary nodule that is concerning for highly differentiated adenocarcinoma.  That lesion is slightly more solid on the most recent scan.  We discussed this today.  She has wanted to be conservative, did ask about the risks for possible metastatic spread.  I did explain that this could happen if we continue to follow.  We talked about possible bronchoscopy, possible referral for empiric XRT depending on how the nodule evolves.  I will repeat her scan in 6 months, plan to do this with contrast to evaluate for lymphadenopathy.  We will follow-up after that scan to decide next steps in work-up.

## 2022-02-02 ENCOUNTER — Ambulatory Visit: Payer: Medicare Other

## 2022-02-04 NOTE — Progress Notes (Unsigned)
Cardiology Office Note   Date:  02/06/2022   ID:  Breck, Chrisley 1930-04-25, MRN [id number]   PCP:  Pincus Sanes, MD  Cardiologist:   Rollene Rotunda, MD   Chief Complaint  Patient presents with   Shortness of Breath     History of Present Illness: Tamara Robertson is a 88 y.o. female who presents for evaluation of PT.  She has had non obstructive CAD.    She had remote PTA in [place], and chronic LE edema.   She was in the hospital with new onset atrial fibrillation with rapid ventricular response.  He had demand ischemia with elevated troponins.  She was treated with IV diltiazem and also antibiotics for possible pneumonia.  He converted to spontaneous sinus rhythm.  He had some sinus bradycardia with first-degree AV block and some 2.5-second pauses.  She was sent home on a low-dose beta-blocker and Eliquis.  Her echo demonstrated EF of 60 to 65% with normal wall motion.  She did have some dizziness.  She was to receive a 30-day event monitor.  Because of dizziness we did move her beta-blocker to the evening.    She wore a monitor and there was no evidence of atrial fib.   She came off of DOAC because of GI bleeding and rash on Eliquis.    Since I last saw her she has had lots of doctors appointments and I reviewed these.  She was actually in the emergency room for constipation.  She has some slow-growing nodules in her lungs that are being followed by Dr. Delton Coombes.  The patient denies any new symptoms such as chest discomfort, neck or arm discomfort. There has been no new PND or orthopnea. There have been no reported palpitations, presyncope or syncope.  She does have some mild shortness of breath at times.  It is episodic and it feels like she just has to take deep breaths.  She is not describing necessarily dyspnea with exertion.  It is sometimes a little worse in the morning.  However, it just goes away after she rests for a little bit.  It is not reproducible with any activities.   Is not a daily symptom.  I have treated her lower extremity swelling with diuretic and this has been improved.  She has a little bit of a dry cough.   Past Medical History:  Diagnosis Date   Achilles tendonitis    Anxiety    Aortic atherosclerosis (HCC)    CAD (coronary artery disease)    RCA 40% stenosis cath 01/2011   Diverticular stricture [facility]) 2006   [facility]   DIVERTICULITIS, HX OF    Diverticulosis    DYSLIPIDEMIA    Elevated LFTs    GERD    Glaucoma    Hepatic steatosis    HOH (hard of hearing)    Immune thrombocytopenic purpura (HCC)    chronic - baseline 80-100K, on pred   Irritable bowel syndrome    Left ovarian cyst dx 01/2013 CT   working with gyn, ?malignant - elevated tumor marker OVA1   OSTEOARTHRITIS, KNEE, RIGHT    OSTEOPENIA    OVERACTIVE BLADDER    Plantar fasciitis    Prolapse of female bladder, acquired 11/2017   Torn rotator cuff    right shoulder   URINARY INCONTINENCE     Past Surgical History:  Procedure Laterality Date   ABDOMINAL HYSTERECTOMY  1963   APPENDECTOMY  1956  CARDIAC CATHETERIZATION     CATARACT EXTRACTION, BILATERAL  10/2010   CHOLECYSTECTOMY N/A 09/16/2014   Procedure: LAPAROSCOPIC CHOLECYSTECTOMY ;  Surgeon: Emelia Loron, MD;  Location: [facility] OR;  Service: General;  Laterality: N/A;   KNEE ARTHROSCOPY Right    L pop PTA  10/2009   stent   LAPAROSCOPIC SIGMOID COLECTOMY  10/2005   SPLENECTOMY  1954   VARICOSE VEIN SURGERY Right 1962     Current Outpatient Medications  Medication Sig Dispense Refill   acetaminophen (TYLENOL) 500 MG tablet Take 500 mg by mouth every 6 (six) hours as needed.     Biotin 2500 MCG CAPS Take 1 capsule by mouth daily.      calcium carbonate (TUMS - DOSED IN MG ELEMENTAL CALCIUM) 500 MG chewable tablet Chew 1 tablet by mouth daily as needed for indigestion or heartburn.     cholecalciferol (VITAMIN D3) 25 MCG (1000 UNIT) tablet Take 1,000 Units by mouth daily.      escitalopram (LEXAPRO) 5 MG tablet Take 1 tablet (5 mg total) by mouth daily. 90 tablet 3   famotidine (PEPCID) 20 MG tablet Take 1 tablet (20 mg total) by mouth 2 (two) times daily as needed for heartburn or indigestion. 180 tablet 3   Flaxseed MISC Take 5 mLs by mouth daily. Take 1 tsp daily     Glucos-Chond-Hyal Ac-Ca Fructo (MOVE FREE JOINT HEALTH ADVANCE PO) Take by mouth.     lidocaine (LMX) 4 % cream Apply 1 application topically as needed.     Magnesium Oxide 250 MG TABS Take 1 tablet by mouth.     metoprolol succinate (TOPROL-XL) 25 MG 24 hr tablet TAKE 1/2 (ONE-HALF) TABLET BY MOUTH ONCE DAILY WITH OR IMMEDIATELY FOLLOWING A MEAL 45 tablet 10   Multiple Vitamin (MULTIVITAMIN) tablet Take 1 tablet by mouth daily.       polyethylene glycol (MIRALAX / GLYCOLAX) packet Take 17 g by mouth daily as needed for moderate constipation.      predniSONE (DELTASONE) 2.5 MG tablet Take 1 tablet (2.5 mg total) by mouth every other day. 45 tablet 3   Probiotic Product (PROBIOTIC DAILY PO) Take 1 tablet by mouth daily with breakfast.     PSYLLIUM PO Take 1 tablet by mouth every other day.     triamcinolone cream (KENALOG) 0.1 % SMARTSIG:2 Topical Twice Daily     trolamine salicylate (ASPERCREME) 10 % cream Apply 1 application topically as needed for muscle pain.     furosemide (LASIX) 20 MG tablet Take 1.5 tablets (30 mg total) by mouth daily. 135 tablet 3   No current facility-administered medications for this visit.    Allergies:   Aspirin, Sulfa antibiotics, Contrast media [iodinated contrast media], Shellfish-derived products, Eliquis [apixaban], Fluoxetine, Sulfamethoxazole-trimethoprim, Celebrex [celecoxib], Celexa [citalopram], and Statins    ROS:  Please see the history of present illness.   Otherwise, review of systems are positive for none .   All other systems are reviewed and negative.     PHYSICAL EXAM: VS:  BP (!) 146/56   Pulse 81   Ht 5\' 2"  (1.575 m)   Wt 148 lb 3.2 oz (67.2 kg)    SpO2 98%   BMI 27.11 kg/m  , BMI Body mass index is 27.11 kg/m. GENERAL:  Well appearing NECK:  No jugular venous distention, waveform within normal limits, carotid upstroke brisk and symmetric, no bruits, no thyromegaly LUNGS:  Clear to auscultation bilaterally CHEST:  Unremarkable HEART:  PMI not displaced or sustained,S1 and  S2 within normal limits, no S3, no S4, no clicks, no rubs, 3 of 6 apical systolic murmur radiating slightly at the aortic outflow tract, no diastolic murmurs ABD:  Flat, positive bowel sounds normal in frequency in pitch, no bruits, no rebound, no guarding, no midline pulsatile mass, no hepatomegaly, no splenomegaly EXT:  2 plus pulses throughout, no edema, no cyanosis no clubbing  EKG:  EKG is  ordered today. Sinus rhythm, rate 81, axis within normal limits, intervals within normal limits, no acute ST-T wave changes.  Recent Labs: 11/16/2021: ALT 17; BUN 16; Creatinine, Ser 0.84; Potassium 4.2; Sodium 139 12/27/2021: Hemoglobin 12.3; Platelets 150.0    Lipid Panel    Component Value Date/Time   CHOL 190 12/14/2020 1616   CHOL 215 (H) 06/22/2014 0825   TRIG 262.0 (H) 12/14/2020 1616   TRIG 161 (H) 06/22/2014 0825   TRIG 228 02/20/2010 0000   HDL 46.00 12/14/2020 1616   HDL 51 06/22/2014 0825   CHOLHDL 4 12/14/2020 1616   VLDL 52.4 (H) 12/14/2020 1616   LDLCALC 107 (H) 05/23/2017 1151   LDLCALC 132 (H) 06/22/2014 0825   LDLDIRECT 118.0 12/14/2020 1616      Wt Readings from Last 3 Encounters:  02/06/22 148 lb 3.2 oz (67.2 kg)  01/30/22 149 lb 12.8 oz (67.9 kg)  01/02/22 148 lb 6 oz (67.3 kg)      Other studies Reviewed: Additional studies/ records that were reviewed today include:  ED records and pulmonary records Review of the above records demonstrates: See elsewhere   ASSESSMENT AND PLAN:   Paroxysmal Atrial Fibrillation She has had no further paroxysms.  No change in therapy.  Mild to Moderate Aortic Regurgitation/Mild to Moderate  Mitral Regurgitation She wants conservative management so I will follow this symptomatically only.  At present I do not think any of the mild shortness of breath is necessarily related to this critically with the ongoing lung process that has been identified.  Hypertension The blood pressure is at target.  No change in therapy.  Thrombocytopenia:  Her labs were recently low normal.  Leg swelling: Her leg swelling is at baseline and she is taking 30 mg of Lasix daily.  I will continue this.  Less bothersome.  No change in therapy.  She should likely get a basic metabolic profile at her next visit.   The current medicines are reviewed at length with the patient today.  The patient does not have concerns regarding medicines.  The following changes have been made:    None  Labs/ tests ordered today include: None   Orders Placed This Encounter  Procedures   EKG 12-Lead     Disposition:   FU with me in 4 months.    Signed, Rollene Rotunda, MD  02/06/2022 4:22 PM    [facility]

## 2022-02-06 ENCOUNTER — Ambulatory Visit: Payer: Medicare Other | Attending: Cardiology | Admitting: Cardiology

## 2022-02-06 ENCOUNTER — Encounter: Payer: Self-pay | Admitting: Cardiology

## 2022-02-06 VITALS — BP 146/56 | HR 81 | Ht 62.0 in | Wt 148.2 lb

## 2022-02-06 DIAGNOSIS — R42 Dizziness and giddiness: Secondary | ICD-10-CM | POA: Diagnosis not present

## 2022-02-06 DIAGNOSIS — I2510 Atherosclerotic heart disease of native coronary artery without angina pectoris: Secondary | ICD-10-CM

## 2022-02-06 DIAGNOSIS — I10 Essential (primary) hypertension: Secondary | ICD-10-CM

## 2022-02-06 DIAGNOSIS — I480 Paroxysmal atrial fibrillation: Secondary | ICD-10-CM | POA: Diagnosis not present

## 2022-02-06 DIAGNOSIS — I351 Nonrheumatic aortic (valve) insufficiency: Secondary | ICD-10-CM | POA: Diagnosis not present

## 2022-02-06 MED ORDER — FUROSEMIDE 20 MG PO TABS: 30.0000 mg | ORAL_TABLET | Freq: Every day | ORAL | 3 refills | Status: DC

## 2022-02-06 NOTE — Patient Instructions (Signed)
Medication Instructions:  Your physician recommends that you continue on your current medications as directed. Please refer to the Current Medication list given to you today.  *If you need a refill on your cardiac medications before your next appointment, please call your pharmacy*  Follow-Up: At [facility], you and your health needs are our priority.  As part of our continuing mission to provide you with exceptional heart care, we have created designated Provider Care Teams.  These Care Teams include your primary Cardiologist (physician) and Advanced Practice Providers (APPs -  Physician Assistants and Nurse Practitioners) who all work together to provide you with the care you need, when you need it.  We recommend signing up for the patient portal called "MyChart".  Sign up information is provided on this After Visit Summary.  MyChart is used to connect with patients for Virtual Visits (Telemedicine).  Patients are able to view lab/test results, encounter notes, upcoming appointments, etc.  Non-urgent messages can be sent to your provider as well.   To learn more about what you can do with MyChart, go to [email or web address].    Your next appointment:   4 month(s)  The format for your next appointment:   In Person  Provider:   James Hochrein, MD          

## 2022-02-13 ENCOUNTER — Ambulatory Visit: Payer: Medicare Other | Admitting: Podiatry

## 2022-02-16 ENCOUNTER — Encounter: Payer: Self-pay | Admitting: Internal Medicine

## 2022-02-17 ENCOUNTER — Emergency Department (HOSPITAL_COMMUNITY): Payer: Medicare Other

## 2022-02-17 ENCOUNTER — Inpatient Hospital Stay (HOSPITAL_COMMUNITY)
Admission: EM | Admit: 2022-02-17 | Discharge: 2022-02-20 | DRG: 378 | Disposition: A | Discharge status: Home / Self Care | Payer: Medicare Other | Attending: Internal Medicine | Admitting: Internal Medicine

## 2022-02-17 ENCOUNTER — Encounter (HOSPITAL_COMMUNITY): Payer: Self-pay | Admitting: *Deleted

## 2022-02-17 ENCOUNTER — Other Ambulatory Visit: Payer: Self-pay

## 2022-02-17 DIAGNOSIS — I700 Atherosclerosis of aorta: Secondary | ICD-10-CM | POA: Diagnosis present

## 2022-02-17 DIAGNOSIS — K922 Gastrointestinal hemorrhage, unspecified: Secondary | ICD-10-CM | POA: Diagnosis not present

## 2022-02-17 DIAGNOSIS — H409 Unspecified glaucoma: Secondary | ICD-10-CM | POA: Diagnosis present

## 2022-02-17 DIAGNOSIS — R578 Other shock: Secondary | ICD-10-CM | POA: Diagnosis not present

## 2022-02-17 DIAGNOSIS — K621 Rectal polyp: Secondary | ICD-10-CM | POA: Diagnosis present

## 2022-02-17 DIAGNOSIS — K625 Hemorrhage of anus and rectum: Secondary | ICD-10-CM

## 2022-02-17 DIAGNOSIS — Z91041 Radiographic dye allergy status: Secondary | ICD-10-CM

## 2022-02-17 DIAGNOSIS — Z66 Do not resuscitate: Secondary | ICD-10-CM | POA: Diagnosis present

## 2022-02-17 DIAGNOSIS — K921 Melena: Secondary | ICD-10-CM

## 2022-02-17 DIAGNOSIS — I2510 Atherosclerotic heart disease of native coronary artery without angina pectoris: Secondary | ICD-10-CM | POA: Diagnosis present

## 2022-02-17 DIAGNOSIS — D72829 Elevated white blood cell count, unspecified: Secondary | ICD-10-CM

## 2022-02-17 DIAGNOSIS — K219 Gastro-esophageal reflux disease without esophagitis: Secondary | ICD-10-CM | POA: Diagnosis present

## 2022-02-17 DIAGNOSIS — E785 Hyperlipidemia, unspecified: Secondary | ICD-10-CM | POA: Diagnosis present

## 2022-02-17 DIAGNOSIS — D62 Acute posthemorrhagic anemia: Secondary | ICD-10-CM

## 2022-02-17 DIAGNOSIS — Z7952 Long term (current) use of systemic steroids: Secondary | ICD-10-CM

## 2022-02-17 DIAGNOSIS — D693 Immune thrombocytopenic purpura: Secondary | ICD-10-CM | POA: Diagnosis present

## 2022-02-17 DIAGNOSIS — Z882 Allergy status to sulfonamides status: Secondary | ICD-10-CM

## 2022-02-17 DIAGNOSIS — Z886 Allergy status to analgesic agent status: Secondary | ICD-10-CM

## 2022-02-17 DIAGNOSIS — K635 Polyp of colon: Secondary | ICD-10-CM | POA: Diagnosis present

## 2022-02-17 DIAGNOSIS — Z9049 Acquired absence of other specified parts of digestive tract: Secondary | ICD-10-CM

## 2022-02-17 DIAGNOSIS — Z91013 Allergy to seafood: Secondary | ICD-10-CM

## 2022-02-17 DIAGNOSIS — K5731 Diverticulosis of large intestine without perforation or abscess with bleeding: Principal | ICD-10-CM

## 2022-02-17 DIAGNOSIS — Z9081 Acquired absence of spleen: Secondary | ICD-10-CM

## 2022-02-17 DIAGNOSIS — M8580 Other specified disorders of bone density and structure, unspecified site: Secondary | ICD-10-CM | POA: Diagnosis present

## 2022-02-17 DIAGNOSIS — Z8249 Family history of ischemic heart disease and other diseases of the circulatory system: Secondary | ICD-10-CM

## 2022-02-17 DIAGNOSIS — K760 Fatty (change of) liver, not elsewhere classified: Secondary | ICD-10-CM | POA: Diagnosis present

## 2022-02-17 DIAGNOSIS — K2970 Gastritis, unspecified, without bleeding: Secondary | ICD-10-CM | POA: Diagnosis present

## 2022-02-17 DIAGNOSIS — K644 Residual hemorrhoidal skin tags: Secondary | ICD-10-CM | POA: Diagnosis present

## 2022-02-17 DIAGNOSIS — Z83438 Family history of other disorder of lipoprotein metabolism and other lipidemia: Secondary | ICD-10-CM

## 2022-02-17 DIAGNOSIS — K641 Second degree hemorrhoids: Secondary | ICD-10-CM | POA: Diagnosis present

## 2022-02-17 DIAGNOSIS — I10 Essential (primary) hypertension: Secondary | ICD-10-CM | POA: Diagnosis present

## 2022-02-17 DIAGNOSIS — I480 Paroxysmal atrial fibrillation: Secondary | ICD-10-CM | POA: Diagnosis present

## 2022-02-17 DIAGNOSIS — Z888 Allergy status to other drugs, medicaments and biological substances status: Secondary | ICD-10-CM

## 2022-02-17 DIAGNOSIS — Q438 Other specified congenital malformations of intestine: Secondary | ICD-10-CM

## 2022-02-17 DIAGNOSIS — Z79899 Other long term (current) drug therapy: Secondary | ICD-10-CM

## 2022-02-17 LAB — I-STAT CHEM 8, ED
BUN: 23 mg/dL (ref 8–23)
Calcium, Ion: 1.11 mmol/L — ABNORMAL LOW (ref 1.15–1.40)
Chloride: 103 mmol/L (ref 98–111)
Creatinine, Ser: 0.9 mg/dL (ref 0.44–1.00)
Glucose, Bld: 112 mg/dL — ABNORMAL HIGH (ref 70–99)
HCT: 34 % — ABNORMAL LOW (ref 36.0–46.0)
Hemoglobin: 11.6 g/dL — ABNORMAL LOW (ref 12.0–15.0)
Potassium: 3.7 mmol/L (ref 3.5–5.1)
Sodium: 142 mmol/L (ref 135–145)
TCO2: 24 mmol/L (ref 22–32)

## 2022-02-17 LAB — CBC WITH DIFFERENTIAL/PLATELET
Abs Immature Granulocytes: 0.06 10*3/uL (ref 0.00–0.07)
Basophils Absolute: 0.1 10*3/uL (ref 0.0–0.1)
Basophils Relative: 0 %
Eosinophils Absolute: 0.2 10*3/uL (ref 0.0–0.5)
Eosinophils Relative: 2 %
HCT: 34.2 % — ABNORMAL LOW (ref 36.0–46.0)
Hemoglobin: 11.1 g/dL — ABNORMAL LOW (ref 12.0–15.0)
Immature Granulocytes: 1 %
Lymphocytes Relative: 45 %
Lymphs Abs: 5.3 10*3/uL — ABNORMAL HIGH (ref 0.7–4.0)
MCH: 27.9 pg (ref 26.0–34.0)
MCHC: 32.5 g/dL (ref 30.0–36.0)
MCV: 85.9 fL (ref 80.0–100.0)
Monocytes Absolute: 1 10*3/uL (ref 0.1–1.0)
Monocytes Relative: 9 %
Neutro Abs: 4.9 10*3/uL (ref 1.7–7.7)
Neutrophils Relative %: 43 %
Platelets: 146 10*3/uL — ABNORMAL LOW (ref 150–400)
RBC: 3.98 MIL/uL (ref 3.87–5.11)
RDW: 14.7 % (ref 11.5–15.5)
WBC: 11.5 10*3/uL — ABNORMAL HIGH (ref 4.0–10.5)
nRBC: 0 % (ref 0.0–0.2)

## 2022-02-17 LAB — LACTIC ACID, PLASMA
Lactic Acid, Venous: 3 mmol/L (ref 0.5–1.9)
Lactic Acid, Venous: 2.3 mmol/L (ref 0.5–1.9)
Lactic Acid, Venous: 1.8 mmol/L (ref 0.5–1.9)

## 2022-02-17 LAB — COMPREHENSIVE METABOLIC PANEL
ALT: 14 U/L (ref 0–44)
AST: 22 U/L (ref 15–41)
Albumin: 3.6 g/dL (ref 3.5–5.0)
Alkaline Phosphatase: 57 U/L (ref 38–126)
Anion gap: 7 (ref 5–15)
BUN: 24 mg/dL — ABNORMAL HIGH (ref 8–23)
CO2: 26 mmol/L (ref 22–32)
Calcium: 8.5 mg/dL — ABNORMAL LOW (ref 8.9–10.3)
Chloride: 107 mmol/L (ref 98–111)
Creatinine, Ser: 0.94 mg/dL (ref 0.44–1.00)
GFR, Estimated: 58 mL/min — ABNORMAL LOW (ref >60)
Glucose, Bld: 123 mg/dL — ABNORMAL HIGH (ref 70–99)
Potassium: 3.6 mmol/L (ref 3.5–5.1)
Sodium: 140 mmol/L (ref 135–145)
Total Bilirubin: 0.9 mg/dL (ref 0.3–1.2)
Total Protein: 6.5 g/dL (ref 6.5–8.1)

## 2022-02-17 LAB — URINALYSIS, ROUTINE W REFLEX MICROSCOPIC
Bilirubin Urine: NEGATIVE
Glucose, UA: NEGATIVE mg/dL
Ketones, ur: NEGATIVE mg/dL
Nitrite: NEGATIVE
Protein, ur: NEGATIVE mg/dL
Specific Gravity, Urine: 1.013 (ref 1.005–1.030)
pH: 7 (ref 5.0–8.0)

## 2022-02-17 LAB — LIPASE, BLOOD: Lipase: 29 U/L (ref 11–51)

## 2022-02-17 LAB — PROTIME-INR
INR: 1.1 (ref 0.8–1.2)
Prothrombin Time: 14.3 seconds (ref 11.4–15.2)

## 2022-02-17 LAB — PREPARE RBC (CROSSMATCH)

## 2022-02-17 LAB — MAGNESIUM: Magnesium: 2.3 mg/dL (ref 1.7–2.4)

## 2022-02-17 MED ORDER — SODIUM CHLORIDE 0.9 % IV SOLN: 10.0000 mL/h | Freq: Once | INTRAVENOUS | Status: DC

## 2022-02-17 MED ORDER — LACTATED RINGERS IV BOLUS
500.0000 mL | Freq: Once | INTRAVENOUS | Status: AC
  Administered 2022-02-17: 500 mL via INTRAVENOUS

## 2022-02-17 MED ORDER — DIPHENHYDRAMINE HCL 50 MG/ML IJ SOLN
50.0000 mg | Freq: Once | INTRAMUSCULAR | Status: AC
  Filled 2022-02-17: qty 1

## 2022-02-17 MED ORDER — LIDOCAINE 5 % EX PTCH
1.0000 | MEDICATED_PATCH | CUTANEOUS | Status: DC
  Administered 2022-02-17 – 2022-02-18 (×2): 1 via TRANSDERMAL
  Filled 2022-02-17 (×3): qty 1

## 2022-02-17 MED ORDER — LACTATED RINGERS IV SOLN: INTRAVENOUS | Status: DC

## 2022-02-17 MED ORDER — IOHEXOL 350 MG/ML SOLN
100.0000 mL | Freq: Once | INTRAVENOUS | Status: AC | PRN
  Administered 2022-02-17: 100 mL via INTRAVENOUS

## 2022-02-17 MED ORDER — ACETAMINOPHEN 325 MG PO TABS
650.0000 mg | ORAL_TABLET | Freq: Once | ORAL | Status: AC
  Administered 2022-02-17: 650 mg via ORAL
  Filled 2022-02-17: qty 2

## 2022-02-17 MED ORDER — DIPHENHYDRAMINE HCL 25 MG PO CAPS
50.0000 mg | ORAL_CAPSULE | Freq: Once | ORAL | Status: AC
  Administered 2022-02-17: 50 mg via ORAL
  Filled 2022-02-17: qty 2

## 2022-02-17 MED ORDER — METHYLPREDNISOLONE SODIUM SUCC 40 MG IJ SOLR
40.0000 mg | Freq: Once | INTRAMUSCULAR | Status: AC
  Administered 2022-02-17: 40 mg via INTRAVENOUS
  Filled 2022-02-17: qty 1

## 2022-02-17 NOTE — ED Triage Notes (Signed)
Patient BIB EMS and coming from home. She presents today with rectal bleeding that began around 9am. Four days ago she began to have gas that seemed to get worse. EMS did not report a large amount of bleeding. Pt a/o x 4 and ambulatory. Denies pain.

## 2022-02-17 NOTE — H&P (Signed)
History and Physical    Tamara Robertson:[id number] DOB: [birth year] DOA: 02/17/2022  PCP: Tamara Sanes, MD  Patient coming from: Home  Chief Complaint: Rectal bleeding  HPI: Tamara Robertson is a 88 y.o. female with medical history significant of paroxysmal A-fib not on anticoagulation, GERD, IBS with chronic constipation, history of complicated diverticular disease requiring sigmoidectomy in 2007 for stricture, prior SBO felt secondary to adhesive disease, hypertension, hyperlipidemia, CAD, PVD, history of ITP, pulmonary nodule suspicious for lung cancer followed by pulmonology.  Patient presents to the ED today complaining of acute onset painless rectal bleeding.  EMS reported small-volume bright red blood in toilet.  In the ED, patient was initially hypotensive.  She was given 500 cc LR bolus and 3 units PRBCs ordered.  Labs showing WBC 11.5, hemoglobin 11.1 (previously in the 12-14 range), platelet count 146k, BUN 24, lactic acid 3.0>> 1.8.  CTA abdomen pelvis showing no evidence of active GI bleeding.  Does show extensive colonic diverticulosis without evidence of diverticulitis.  ED physician consulted Tamara Robertson with [facility].  Patient states around 9 AM this morning she had an episode of large-volume rectal bleeding.  She had an additional episode at home and then 3 more episodes while in the emergency room.  She does not take any blood thinners due to history of ITP.  Denies abdominal or rectal pain and has not vomited blood.  She denies lightheadedness/dizziness, chest pain, or shortness of breath.  No other complaints.  Review of Systems:  Review of Systems  All other systems reviewed and are negative.   Past Medical History:  Diagnosis Date   Achilles tendonitis    Anxiety    Aortic atherosclerosis (HCC)    CAD (coronary artery disease)    RCA 40% stenosis cath 01/2011   Diverticular stricture [facility]) 2006   [facility]   DIVERTICULITIS, HX OF    Diverticulosis     DYSLIPIDEMIA    Elevated LFTs    GERD    Glaucoma    Hepatic steatosis    HOH (hard of hearing)    Immune thrombocytopenic purpura (HCC)    chronic - baseline 80-100K, on pred   Irritable bowel syndrome    Left ovarian cyst dx 01/2013 CT   working with gyn, ?malignant - elevated tumor marker OVA1   OSTEOARTHRITIS, KNEE, RIGHT    OSTEOPENIA    OVERACTIVE BLADDER    Plantar fasciitis    Prolapse of female bladder, acquired 11/2017   Torn rotator cuff    right shoulder   URINARY INCONTINENCE     Past Surgical History:  Procedure Laterality Date   ABDOMINAL HYSTERECTOMY  1963   APPENDECTOMY  1956   CARDIAC CATHETERIZATION     CATARACT EXTRACTION, BILATERAL  10/2010   CHOLECYSTECTOMY N/A 09/16/2014   Procedure: LAPAROSCOPIC CHOLECYSTECTOMY ;  Surgeon: Emelia Loron, MD;  Location: [facility] OR;  Service: General;  Laterality: N/A;   KNEE ARTHROSCOPY Right    L pop PTA  10/2009   stent   LAPAROSCOPIC SIGMOID COLECTOMY  10/2005   SPLENECTOMY  1954   VARICOSE VEIN SURGERY Right 1962     reports that she has never smoked. She has never used smokeless tobacco. She reports that she does not drink alcohol and does not use drugs.  Allergies  Allergen Reactions   Aspirin Other (See Comments)    ITP   Sulfa Antibiotics Other (See Comments)    dizziness   Contrast Media [Iodinated Contrast  Media] Hives    CAT scan contrast only   Shellfish-Derived Products Hives    Other reaction(s): Other   Eliquis [Apixaban]     Rash    Fluoxetine     Did not feel well on it    Sulfamethoxazole-Trimethoprim     Other reaction(s): Unknown   Celebrex [Celecoxib] Other (See Comments)    Leg swelling   Celexa [Citalopram] Other (See Comments)    Felt out of it   Statins Other (See Comments)    Family History  Problem Relation Age of Onset   Coronary artery disease Mother    Heart attack Mother 9   Hyperlipidemia Mother    Hypertension Mother    Stomach cancer Father     Hypertension Daughter    Hyperlipidemia Daughter    Arthritis Other        parent   Transient ischemic attack Other        parent    Prior to Admission medications   Medication Sig Start Date End Date Taking? Authorizing Provider  acetaminophen (TYLENOL) 500 MG tablet Take 500 mg by mouth every 6 (six) hours as needed.    [provider]  Biotin 2500 MCG CAPS Take 1 capsule by mouth daily.     [provider]  calcium carbonate (TUMS - DOSED IN MG ELEMENTAL CALCIUM) 500 MG chewable tablet Chew 1 tablet by mouth daily as needed for indigestion or heartburn.    [provider]  cholecalciferol (VITAMIN D3) 25 MCG (1000 UNIT) tablet Take 1,000 Units by mouth daily.    [provider]  escitalopram (LEXAPRO) 5 MG tablet Take 1 tablet (5 mg total) by mouth daily. 01/20/21   Tamara Sanes, MD  famotidine (PEPCID) 20 MG tablet Take 1 tablet (20 mg total) by mouth 2 (two) times daily as needed for heartburn or indigestion. 01/20/21   Tamara Sanes, MD  Flaxseed MISC Take 5 mLs by mouth daily. Take 1 tsp daily    [provider]  furosemide (LASIX) 20 MG tablet Take 1.5 tablets (30 mg total) by mouth daily. 02/06/22   Rollene Rotunda, MD  Glucos-Chond-Hyal Ac-Ca Fructo (MOVE FREE JOINT HEALTH ADVANCE PO) Take by mouth.    [provider]  lidocaine (LMX) 4 % cream Apply 1 application topically as needed.    [provider]  Magnesium Oxide 250 MG TABS Take 1 tablet by mouth. 09/22/09   [provider]  metoprolol succinate (TOPROL-XL) 25 MG 24 hr tablet TAKE 1/2 (ONE-HALF) TABLET BY MOUTH ONCE DAILY WITH OR IMMEDIATELY FOLLOWING A MEAL 09/16/21   Rollene Rotunda, MD  Multiple Vitamin (MULTIVITAMIN) tablet Take 1 tablet by mouth daily.      [provider]  polyethylene glycol (MIRALAX / GLYCOLAX) packet Take 17 g by mouth daily as needed for moderate constipation.     [provider]  predniSONE (DELTASONE) 2.5 MG  tablet Take 1 tablet (2.5 mg total) by mouth every other day. 02/23/21   Tamara Sanes, MD  Probiotic Product (PROBIOTIC DAILY PO) Take 1 tablet by mouth daily with breakfast.    [provider]  PSYLLIUM PO Take 1 tablet by mouth every other day.    [provider]  triamcinolone cream (KENALOG) 0.1 % SMARTSIG:2 Topical Twice Daily 04/12/21   [provider]  trolamine salicylate (ASPERCREME) 10 % cream Apply 1 application topically as needed for muscle pain.    [provider]  metoprolol succinate (TOPROL-XL) 25  MG 24 hr tablet Take 1 tablet (25 mg total) by mouth daily. 01/06/21   Tyrone Nine, MD    Physical Exam: Vitals:   02/17/22 2130 02/17/22 2153 02/17/22 2200 02/17/22 2215  BP: (!) 153/69 (!) 148/68 (!) 151/69 (!) 147/77  Pulse: 85 82 85 86  Resp: 13 14 13 17   Temp: 98.2 F (36.8 C) 98 F (36.7 C)  98.2 F (36.8 C)  TempSrc:      SpO2: 98% 97% 97% 96%  Weight:      Height:        Physical Exam Vitals reviewed.  Constitutional:      General: She is not in acute distress. HENT:     Head: Normocephalic and atraumatic.  Eyes:     Extraocular Movements: Extraocular movements intact.  Cardiovascular:     Rate and Rhythm: Normal rate and regular rhythm.     Pulses: Normal pulses.  Pulmonary:     Effort: Pulmonary effort is normal. No respiratory distress.     Breath sounds: Normal breath sounds. No wheezing or rales.  Abdominal:     General: Bowel sounds are normal. There is no distension.     Palpations: Abdomen is soft.     Tenderness: There is no abdominal tenderness. There is no guarding or rebound.  Musculoskeletal:        General: No swelling or tenderness.     Cervical back: Normal range of motion.  Skin:    General: Skin is warm and dry.  Neurological:     General: No focal deficit present.     Mental Status: She is alert and oriented to person, place, and time.     Labs on Admission: I have personally reviewed  following labs and imaging studies  CBC: Recent Labs  Lab 02/17/22 1415 02/17/22 1430  WBC 11.5*  --   NEUTROABS 4.9  --   HGB 11.1* 11.6*  HCT 34.2* 34.0*  MCV 85.9  --   PLT 146*  --    Basic Metabolic Panel: Recent Labs  Lab 02/17/22 1415 02/17/22 1430  NA 140 142  K 3.6 3.7  CL 107 103  CO2 26  --   GLUCOSE 123* 112*  BUN 24* 23  CREATININE 0.94 0.90  CALCIUM 8.5*  --   MG 2.3  --    GFR: Estimated Creatinine Clearance: 37 mL/min (by C-G formula based on SCr of 0.9 mg/dL). Liver Function Tests: Recent Labs  Lab 02/17/22 1415  AST 22  ALT 14  ALKPHOS 57  BILITOT 0.9  PROT 6.5  ALBUMIN 3.6   Recent Labs  Lab 02/17/22 1415  LIPASE 29   No results for input(s): "AMMONIA" in the last 168 hours. Coagulation Profile: Recent Labs  Lab 02/17/22 1415  INR 1.1   Cardiac Enzymes: No results for input(s): "CKTOTAL", "CKMB", "CKMBINDEX", "TROPONINI" in the last 168 hours. BNP (last 3 results) No results for input(s): "PROBNP" in the last 8760 hours. HbA1C: No results for input(s): "HGBA1C" in the last 72 hours. CBG: No results for input(s): "GLUCAP" in the last 168 hours. Lipid Profile: No results for input(s): "CHOL", "HDL", "LDLCALC", "TRIG", "CHOLHDL", "LDLDIRECT" in the last 72 hours. Thyroid Function Tests: No results for input(s): "TSH", "T4TOTAL", "FREET4", "T3FREE", "THYROIDAB" in the last 72 hours. Anemia Panel: No results for input(s): "VITAMINB12", "FOLATE", "FERRITIN", "TIBC", "IRON", "RETICCTPCT" in the last 72 hours. Urine analysis:    Component Value Date/Time   COLORURINE STRAW (A) 02/17/2022 2119  APPEARANCEUR CLEAR 02/17/2022 2119   LABSPEC 1.013 02/17/2022 2119   PHURINE 7.0 02/17/2022 2119   GLUCOSEU NEGATIVE 02/17/2022 2119   GLUCOSEU NEGATIVE 12/14/2020 1616   HGBUR LARGE (A) 02/17/2022 2119   BILIRUBINUR NEGATIVE 02/17/2022 2119   KETONESUR NEGATIVE 02/17/2022 2119   PROTEINUR NEGATIVE 02/17/2022 2119   UROBILINOGEN 0.2  12/14/2020 1616   NITRITE NEGATIVE 02/17/2022 2119   LEUKOCYTESUR TRACE (A) 02/17/2022 2119    Radiological Exams on Admission: CT Angio Abd/Pel W and/or Wo Contrast  Result Date: 02/17/2022 CLINICAL DATA:  Lower GI bleed, rectal bleeding EXAM: CTA ABDOMEN AND PELVIS WITHOUT AND WITH CONTRAST TECHNIQUE: Multidetector CT imaging of the abdomen and pelvis was performed using the standard protocol during bolus administration of intravenous contrast. Multiplanar reconstructed images and MIPs were obtained and reviewed to evaluate the vascular anatomy. RADIATION DOSE REDUCTION: This exam was performed according to the departmental dose-optimization program which includes automated exposure control, adjustment of the mA and/or kV according to patient size and/or use of iterative reconstruction technique. CONTRAST:  OMNIPAQUE IOHEXOL 350 MG/ML SOLN COMPARISON:  CT abdomen/pelvis dated 11/16/2021. FINDINGS: Lower chest: Lung bases are clear. Hepatobiliary: Liver is within normal limits. Status post cholecystectomy. No intrahepatic or extrahepatic ductal dilatation. Pancreas: Within normal limits. Spleen: Status post splenectomy. Adrenals/Urinary Tract: Adrenal glands are within normal limits. 3.3 cm mildly proteinaceous right upper pole renal cyst, without convincing enhancement following contrast administration, benign (Bosniak II). No follow-up is recommended. Kidneys are otherwise within normal limits. No hydronephrosis. Bladder is within normal limits. Stomach/Bowel: Stomach is within normal limits. No evidence of bowel obstruction. Appendix is not discretely visualized. Extensive colonic diverticulosis, without evidence of diverticulitis. Status post left hemicolectomy with suture line in the lower pelvis (series 13/image 61). Following contrast administration, there is no active intravasation of contrast into the bowel lumen to suggest active GI bleeding. Vascular/Lymphatic: No evidence of abdominal  aortic aneurysm. Atherosclerotic calcifications of the abdominal aorta and branch vessels. No suspicious abdominopelvic lymphadenopathy. Reproductive: Status post hysterectomy. No adnexal mass. 5.1 cm simple left adnexal mass (series 13/image 56), chronic and non FDG avid on prior PET. Given the patient's age, no follow-up is recommended. Other: No abdominopelvic ascites. Musculoskeletal: Degenerative changes of the visualized thoracolumbar spine. IMPRESSION: No evidence of active GI bleeding. Status post left hemicolectomy. Extensive colonic diverticulosis, without evidence of diverticulitis. Additional ancillary findings as above. Electronically Signed   By: Charline Bills M.D.   On: 02/17/2022 20:53    EKG: Independently reviewed.  Sinus rhythm, no significant change since prior tracing.  Assessment and Plan  Acute lower GI bleed Patient presenting with acute onset painless rectal bleeding/hematochezia.  Had additional episodes of hematochezia in the ED and became unstable with hypotension.  She was given 500 cc fluid bolus and blood transfusions (3 units PRBCs). Lactic acid 3.0>> 1.8.  Hemodynamics now improved and currently stable.  Hemoglobin 11.1 on initial labs, previously in the 12-14 range. CTA abdomen pelvis showing no evidence of active GI bleeding.  Does show extensive colonic diverticulosis without evidence of diverticulitis.  She is not on antiplatelet agents or anticoagulation. -[facility] consulted -Keep n.p.o. -IV fluid hydration -Serial H&H every 4 hours, transfuse PRBCs if Hgb <8 given CAD -Admit to stepdown unit and monitor hemodynamics very closely  Mild leukocytosis Likely reactive.  No signs of infection. -Continue to monitor  Paroxysmal A-fib Stable, currently in sinus rhythm.  Not on anticoagulation. -Cardiac monitoring  Hypertension -Hold antihypertensives at this time.  CAD Stable, not  endorsing any anginal symptoms and EKG without acute ischemic  changes.  History of ITP Platelet count 146k. -Continue to monitor platelet count  Pharmacy med rec pending.   DVT prophylaxis: SCDs Code Status: DNR (discussed with the patient) Family Communication: Daughter at bedside. Consults called: [facility] Level of care: Step Down Unit Admission status: It is my clinical opinion that referral for OBSERVATION is reasonable and necessary in this patient based on the above information provided. The aforementioned taken together are felt to place the patient at high risk for further clinical deterioration. However, it is anticipated that the patient may be medically stable for discharge from the hospital within 24 to 48 hours.   John Giovanni MD [facility]  If 7PM-7AM, please contact night-coverage [email or web address]  02/17/2022, 10:44 PM

## 2022-02-17 NOTE — ED Provider Notes (Signed)
[facility] DEPT Provider Note   CSN: [id number] Arrival date & time: 02/17/22  1259     History  Chief Complaint  Patient presents with   Rectal Bleeding    EVVA DIN is a 88 y.o. female.   Rectal Bleeding Patient presents for rectal bleeding.  Medical history includes diverticulosis, CAD, HLD, GERD, ITP, arthritis, osteopenia, anemia.  She has had paroxysmal atrial fibrillation in the past.  She was on beta-blocker and Eliquis.  DOAC was discontinued due to GI bleeding and rash.  For her ITP, it was diagnosed in 2000 and is currently in remission.  She was last seen in cancer center 1 year ago.  For relapses, she is prescribed high-dose steroids.  She remains on 2.5 mg of prednisone every other day.  She reports that she was in her normal state of health yesterday.  This morning, at around 9 AM, she began to have painless rectal bleeding.  EMS reports small-volume BRB in toilet.  Patient has had ongoing episodes of diarrhea that has consisted of maroon blood and clots.    Home Medications Prior to Admission medications   Medication Sig Start Date End Date Taking? Authorizing Provider  acetaminophen (TYLENOL) 500 MG tablet Take 500 mg by mouth every 6 (six) hours as needed.    [provider]  Biotin 2500 MCG CAPS Take 1 capsule by mouth daily.     [provider]  calcium carbonate (TUMS - DOSED IN MG ELEMENTAL CALCIUM) 500 MG chewable tablet Chew 1 tablet by mouth daily as needed for indigestion or heartburn.    [provider]  cholecalciferol (VITAMIN D3) 25 MCG (1000 UNIT) tablet Take 1,000 Units by mouth daily.    [provider]  escitalopram (LEXAPRO) 5 MG tablet Take 1 tablet (5 mg total) by mouth daily. 01/20/21   Binnie Rail, MD  famotidine (PEPCID) 20 MG tablet Take 1 tablet (20 mg total) by mouth 2 (two) times daily as needed for heartburn or indigestion. 01/20/21   Binnie Rail, MD  Flaxseed MISC  Take 5 mLs by mouth daily. Take 1 tsp daily    [provider]  furosemide (LASIX) 20 MG tablet Take 1.5 tablets (30 mg total) by mouth daily. 02/06/22   [name], MD  Glucos-Chond-Hyal Ac-Ca Fructo (MOVE FREE JOINT HEALTH ADVANCE PO) Take by mouth.    [provider]  lidocaine (LMX) 4 % cream Apply 1 application topically as needed.    [provider]  Magnesium Oxide 250 MG TABS Take 1 tablet by mouth. 09/22/09   [provider]  metoprolol succinate (TOPROL-XL) 25 MG 24 hr tablet TAKE 1/2 (ONE-HALF) TABLET BY MOUTH ONCE DAILY WITH OR IMMEDIATELY FOLLOWING A MEAL 09/16/21   [name], MD  Multiple Vitamin (MULTIVITAMIN) tablet Take 1 tablet by mouth daily.      [provider]  polyethylene glycol (MIRALAX / GLYCOLAX) packet Take 17 g by mouth daily as needed for moderate constipation.     [provider]  predniSONE (DELTASONE) 2.5 MG tablet Take 1 tablet (2.5 mg total) by mouth every other day. 02/23/21   Binnie Rail, MD  Probiotic Product (PROBIOTIC DAILY PO) Take 1 tablet by mouth daily with breakfast.    [provider]  PSYLLIUM PO Take 1 tablet by mouth every other day.    [provider]  triamcinolone cream (KENALOG) 0.1 % SMARTSIG:2 Topical Twice Daily 04/12/21   [provider]  trolamine salicylate (ASPERCREME) 10 % cream Apply 1 application topically as needed for muscle pain.    [provider]  metoprolol succinate (TOPROL-XL) 25 MG 24 hr tablet Take 1 tablet (25 mg total) by mouth daily. 01/06/21   Patrecia Pour, MD      Allergies    Aspirin, Sulfa antibiotics, Contrast media [iodinated contrast media], Shellfish-derived products, Eliquis [apixaban], Fluoxetine, Sulfamethoxazole-trimethoprim, Celebrex [celecoxib], Celexa [citalopram], and Statins    Review of Systems   Review of Systems  Gastrointestinal:  Positive for blood in stool and hematochezia.  All other systems  reviewed and are negative.   Physical Exam Updated Vital Signs BP (!) 152/69   Pulse 85   Temp 98 F (36.7 C)   Resp 14   Ht '5\' 2"'$  (1.575 m)   Wt 65.8 kg   SpO2 97%   BMI 26.52 kg/m  Physical Exam Vitals and nursing note reviewed.  Constitutional:      General: She is not in acute distress.    Appearance: Normal appearance. She is well-developed and normal weight. She is not ill-appearing, toxic-appearing or diaphoretic.  HENT:     Head: Normocephalic and atraumatic.     Right Ear: External ear normal.     Left Ear: External ear normal.     Nose: Nose normal.     Mouth/Throat:     Mouth: Mucous membranes are moist.     Pharynx: Oropharynx is clear.  Eyes:     Extraocular Movements: Extraocular movements intact.     Conjunctiva/sclera: Conjunctivae normal.  Cardiovascular:     Rate and Rhythm: Normal rate and regular rhythm.     Heart sounds: No murmur heard. Pulmonary:     Effort: Pulmonary effort is normal. No respiratory distress.  Abdominal:     General: There is no distension.     Palpations: Abdomen is soft.     Tenderness: There is no abdominal tenderness.  Musculoskeletal:        General: No swelling. Normal range of motion.     Cervical back: Normal range of motion and neck supple.     Right lower leg: No edema.     Left lower leg: No edema.  Skin:    General: Skin is warm and dry.     Capillary Refill: Capillary refill takes less than 2 seconds.     Coloration: Skin is not jaundiced or pale.  Neurological:     General: No focal deficit present.     Mental Status: She is alert and oriented to person, place, and time.     Cranial Nerves: No cranial nerve deficit.     Sensory: No sensory deficit.     Motor: No weakness.     Coordination: Coordination normal.     Gait: Gait normal.  Psychiatric:        Mood and Affect: Mood normal.        Behavior: Behavior normal.        Thought Content: Thought content normal.        Judgment: Judgment normal.      ED Results / Procedures / Treatments   Labs (all labs ordered are listed, but only abnormal results are displayed) Labs Reviewed  COMPREHENSIVE METABOLIC PANEL - Abnormal; Notable for the following components:      Result Value   Glucose, Bld 123 (*)    BUN 24 (*)    Calcium 8.5 (*)    GFR, Estimated 58 (*)    All  other components within normal limits  CBC WITH DIFFERENTIAL/PLATELET - Abnormal; Notable for the following components:   WBC 11.5 (*)    Hemoglobin 11.1 (*)    HCT 34.2 (*)    Platelets 146 (*)    Lymphs Abs 5.3 (*)    All other components within normal limits  URINALYSIS, ROUTINE W REFLEX MICROSCOPIC - Abnormal; Notable for the following components:   Color, Urine STRAW (*)    Hgb urine dipstick LARGE (*)    Leukocytes,Ua TRACE (*)    Bacteria, UA RARE (*)    All other components within normal limits  LACTIC ACID, PLASMA - Abnormal; Notable for the following components:   Lactic Acid, Venous 3.0 (*)    All other components within normal limits  LACTIC ACID, PLASMA - Abnormal; Notable for the following components:   Lactic Acid, Venous 2.3 (*)    All other components within normal limits  I-STAT CHEM 8, ED - Abnormal; Notable for the following components:   Glucose, Bld 112 (*)    Calcium, Ion 1.11 (*)    Hemoglobin 11.6 (*)    HCT 34.0 (*)    All other components within normal limits  LIPASE, BLOOD  MAGNESIUM  PROTIME-INR  LACTIC ACID, PLASMA  OCCULT BLOOD X 1 CARD TO LAB, STOOL  CBC  CBC  CBC  TYPE AND SCREEN  PREPARE RBC (CROSSMATCH)  PREPARE RBC (CROSSMATCH)    EKG EKG Interpretation  Date/Time:  Saturday February 17 2022 13:52:38 EDT Ventricular Rate:  88 PR Interval:  162 QRS Duration: 76 QT Interval:  362 QTC Calculation: 438 R Axis:   9 Text Interpretation: Sinus rhythm Confirmed by Godfrey Pick ([id number]) on 02/17/2022 1:57:55 PM  Radiology CT Angio Abd/Pel W and/or Wo Contrast  Result Date: 02/17/2022 CLINICAL DATA:  Lower GI  bleed, rectal bleeding EXAM: CTA ABDOMEN AND PELVIS WITHOUT AND WITH CONTRAST TECHNIQUE: Multidetector CT imaging of the abdomen and pelvis was performed using the standard protocol during bolus administration of intravenous contrast. Multiplanar reconstructed images and MIPs were obtained and reviewed to evaluate the vascular anatomy. RADIATION DOSE REDUCTION: This exam was performed according to the departmental dose-optimization program which includes automated exposure control, adjustment of the mA and/or kV according to patient size and/or use of iterative reconstruction technique. CONTRAST:  146m OMNIPAQUE IOHEXOL 350 MG/ML SOLN COMPARISON:  CT abdomen/pelvis dated 11/16/2021. FINDINGS: Lower chest: Lung bases are clear. Hepatobiliary: Liver is within normal limits. Status post cholecystectomy. No intrahepatic or extrahepatic ductal dilatation. Pancreas: Within normal limits. Spleen: Status post splenectomy. Adrenals/Urinary Tract: Adrenal glands are within normal limits. 3.3 cm mildly proteinaceous right upper pole renal cyst, without convincing enhancement following contrast administration, benign (Bosniak II). No follow-up is recommended. Kidneys are otherwise within normal limits. No hydronephrosis. Bladder is within normal limits. Stomach/Bowel: Stomach is within normal limits. No evidence of bowel obstruction. Appendix is not discretely visualized. Extensive colonic diverticulosis, without evidence of diverticulitis. Status post left hemicolectomy with suture line in the lower pelvis (series 13/image 61). Following contrast administration, there is no active intravasation of contrast into the bowel lumen to suggest active GI bleeding. Vascular/Lymphatic: No evidence of abdominal aortic aneurysm. Atherosclerotic calcifications of the abdominal aorta and branch vessels. No suspicious abdominopelvic lymphadenopathy. Reproductive: Status post hysterectomy. No adnexal mass. 5.1 cm simple left adnexal mass  (series 13/image 56), chronic and non FDG avid on prior PET. Given the patient's age, no follow-up is recommended. Other: No abdominopelvic ascites. Musculoskeletal: Degenerative changes of the visualized thoracolumbar  spine. IMPRESSION: No evidence of active GI bleeding. Status post left hemicolectomy. Extensive colonic diverticulosis, without evidence of diverticulitis. Additional ancillary findings as above. Electronically Signed   By: Julian Hy M.D.   On: 02/17/2022 20:53    Procedures Procedures    Medications Ordered in ED Medications  0.9 %  sodium chloride infusion (10 mL/hr Intravenous Not Given 02/17/22 2222)  0.9 %  sodium chloride infusion (10 mL/hr Intravenous Not Given 02/17/22 2223)  lidocaine (LIDODERM) 5 % 1 patch (1 patch Transdermal Patch Applied 02/17/22 1629)  lactated ringers infusion (has no administration in time range)  lactated ringers bolus 500 mL (0 mLs Intravenous Stopped 02/17/22 1949)  methylPREDNISolone sodium succinate (SOLU-MEDROL) 40 mg/mL injection 40 mg (40 mg Intravenous Given 02/17/22 1601)  diphenhydrAMINE (BENADRYL) capsule 50 mg (50 mg Oral Given 02/17/22 1809)    Or  diphenhydrAMINE (BENADRYL) injection 50 mg ( Intravenous See Alternative 02/17/22 1809)  acetaminophen (TYLENOL) tablet 650 mg (650 mg Oral Given 02/17/22 1629)  iohexol (OMNIPAQUE) 350 MG/ML injection 100 mL (100 mLs Intravenous Contrast Given 02/17/22 1958)    ED Course/ Medical Decision Making/ A&P                           Medical Decision Making Amount and/or Complexity of Data Reviewed Labs: ordered. Radiology: ordered.  Risk OTC drugs. Prescription drug management. Decision regarding hospitalization.   This patient presents to the ED for concern of hematochezia, this involves an extensive number of treatment options, and is a complaint that carries with it a high risk of complications and morbidity.  The differential diagnosis includes diverticular bleed,  internal hemorrhoid, colitis, upper GI bleed with rapid transit, mesenteric ischemia   Co morbidities that complicate the patient evaluation  diverticulosis, CAD, HLD, GERD, ITP, arthritis, osteopenia, anemia   Additional history obtained:  Additional history obtained from EMS, patient's daughter External records from outside source obtained and reviewed including EMR   Lab Tests:  I Ordered, and personally interpreted labs.  The pertinent results include: Small drop in hemoglobin from baseline, likely underestimated given the acuity of the bleed; lactic acidosis, mild leukocytosis, normal kidney function and normal electrolytes   Imaging Studies ordered:  I ordered imaging studies including CTA of abdomen and pelvis I independently visualized and interpreted imaging which showed no evidence of active bleed I agree with the radiologist interpretation   Cardiac Monitoring: / EKG:  The patient was maintained on a cardiac monitor.  I personally viewed and interpreted the cardiac monitored which showed an underlying rhythm of: Sinus rhythm   Consultations Obtained:  I requested consultation with the gastroenterologist, Dr. Loletha Carrow,  and discussed lab and imaging findings as well as pertinent plan - they recommend: CTA, admission, consult IR as needed   Problem List / ED Course / Critical interventions / Medication management  Patient is a highly functional 88 year old female who presents for onset of hematochezia this morning.  Patient and EMS report small-volume episodes prior to arrival.  Patient has not had any associated pain.  On arrival in the ED, she is well-appearing.  She is ambulatory without difficulty.  Vital signs are notable for hypertension.  Heart rate is normal, however, she is on metoprolol.  She is not on any blood thinners but does have a history of ITP.  This has been in remission.  Shortly after arrival in the ED, patient had continued episodes of hematochezia.   Volume of blood loss increased.  On reassessment, patient appeared pale and endorsed generalized weakness.  Blood pressure at this point, he is in the low range of normal.  There is concern of acute blood loss with possible shock.  1 unit emergency release blood was ordered.  Patient was typed and screened for additional units.  Laboratory work-up was initiated.  Lab shows mild leukocytosis and a slight decrease in her hemoglobin from baseline.  Given the acuity of her bleeding, I suspect anemia for greater than what is shown on CBC.  Per chart review, she has a history of diverticular disease requiring sigmoidectomy in 2007 for stricture.  She is followed by [facility] (Dr. Hilarie Fredrickson) she was seen in the office 2 weeks ago for an isolated episode of melena.  Given her painless rectal bleeding, diverticular bleed is likely.  A CTA of abdomen pelvis was ordered to assess for active bleeding.  Unfortunately, patient has a history of allergic reaction to contrast dye.  She did tolerate a contrasted scan in 2016 following pretreatment.  Pretreatment medications were ordered.  Following initiation of blood transfusion, patient had improvement in her blood pressure.  She continued to have hematochezia and continued to have a widened pulse pressure.  Her initial lactic acid was elevated at 3.0.  A noncontrasted CT scan was ordered, given the delay she will need to undergo for contrasted study.  While patient was awaiting CTA, she did have decreased frequency and volume of her bloody stools.  She remained hemodynamically stable while undergoing continued blood transfusion.  She endorsed improved generalized weakness.  Patient was ultimately able to undergo CTA of abdomen and pelvis.  On this study, there was no evidence of active bleeding.  Given the severity of her presentation, patient to be admitted for observation.  She was admitted to hospitalist for further management. I ordered medication including PRBCs for  symptomatic anemia, active bleeding; lidocaine patch and Tylenol for chronic left lower back pain; Solu-Medrol and Benadryl for pretreatment for contrasted study Reevaluation of the patient after these medicines showed that the patient improved I have reviewed the patients home medicines and have made adjustments as needed   Social Determinants of Health:  Lives independently  CRITICAL CARE Performed by: Godfrey Pick   Total critical care time: 65 minutes  Critical care time was exclusive of separately billable procedures and treating other patients.  Critical care was necessary to treat or prevent imminent or life-threatening deterioration.  Critical care was time spent personally by me on the following activities: development of treatment plan with patient and/or surrogate as well as nursing, discussions with consultants, evaluation of patient's response to treatment, examination of patient, obtaining history from patient or surrogate, ordering and performing treatments and interventions, ordering and review of laboratory studies, ordering and review of radiographic studies, pulse oximetry and re-evaluation of patient's condition.         Final Clinical Impression(s) / ED Diagnoses Final diagnoses:  Rectal bleeding  Hemorrhagic shock [facility])    Rx / DC Orders ED Discharge Orders     None         Godfrey Pick, MD 02/17/22 2345

## 2022-02-17 NOTE — ED Notes (Signed)
No reaction to blood suspected

## 2022-02-18 ENCOUNTER — Encounter (HOSPITAL_COMMUNITY): Payer: Self-pay | Admitting: Internal Medicine

## 2022-02-18 ENCOUNTER — Observation Stay (HOSPITAL_COMMUNITY): Payer: Medicare Other | Admitting: Anesthesiology

## 2022-02-18 ENCOUNTER — Encounter (HOSPITAL_COMMUNITY)
Admission: EM | Disposition: A | Discharge status: Home / Self Care | Payer: Self-pay | Source: Home / Self Care | Attending: Internal Medicine

## 2022-02-18 DIAGNOSIS — Z7952 Long term (current) use of systemic steroids: Secondary | ICD-10-CM | POA: Diagnosis not present

## 2022-02-18 DIAGNOSIS — K621 Rectal polyp: Secondary | ICD-10-CM | POA: Diagnosis not present

## 2022-02-18 DIAGNOSIS — Z8249 Family history of ischemic heart disease and other diseases of the circulatory system: Secondary | ICD-10-CM | POA: Diagnosis not present

## 2022-02-18 DIAGNOSIS — Z91041 Radiographic dye allergy status: Secondary | ICD-10-CM | POA: Diagnosis not present

## 2022-02-18 DIAGNOSIS — D62 Acute posthemorrhagic anemia: Secondary | ICD-10-CM | POA: Diagnosis present

## 2022-02-18 DIAGNOSIS — D693 Immune thrombocytopenic purpura: Secondary | ICD-10-CM | POA: Diagnosis present

## 2022-02-18 DIAGNOSIS — K644 Residual hemorrhoidal skin tags: Secondary | ICD-10-CM

## 2022-02-18 DIAGNOSIS — K2970 Gastritis, unspecified, without bleeding: Secondary | ICD-10-CM

## 2022-02-18 DIAGNOSIS — K635 Polyp of colon: Secondary | ICD-10-CM

## 2022-02-18 DIAGNOSIS — H409 Unspecified glaucoma: Secondary | ICD-10-CM | POA: Diagnosis present

## 2022-02-18 DIAGNOSIS — K2289 Other specified disease of esophagus: Secondary | ICD-10-CM | POA: Diagnosis not present

## 2022-02-18 DIAGNOSIS — K5730 Diverticulosis of large intestine without perforation or abscess without bleeding: Secondary | ICD-10-CM

## 2022-02-18 DIAGNOSIS — K921 Melena: Secondary | ICD-10-CM

## 2022-02-18 DIAGNOSIS — I10 Essential (primary) hypertension: Secondary | ICD-10-CM

## 2022-02-18 DIAGNOSIS — K219 Gastro-esophageal reflux disease without esophagitis: Secondary | ICD-10-CM | POA: Diagnosis present

## 2022-02-18 DIAGNOSIS — Z886 Allergy status to analgesic agent status: Secondary | ICD-10-CM | POA: Diagnosis not present

## 2022-02-18 DIAGNOSIS — I2510 Atherosclerotic heart disease of native coronary artery without angina pectoris: Secondary | ICD-10-CM

## 2022-02-18 DIAGNOSIS — Q438 Other specified congenital malformations of intestine: Secondary | ICD-10-CM | POA: Diagnosis not present

## 2022-02-18 DIAGNOSIS — F418 Other specified anxiety disorders: Secondary | ICD-10-CM | POA: Diagnosis not present

## 2022-02-18 DIAGNOSIS — K5790 Diverticulosis of intestine, part unspecified, without perforation or abscess without bleeding: Secondary | ICD-10-CM

## 2022-02-18 DIAGNOSIS — E785 Hyperlipidemia, unspecified: Secondary | ICD-10-CM | POA: Diagnosis present

## 2022-02-18 DIAGNOSIS — I480 Paroxysmal atrial fibrillation: Secondary | ICD-10-CM | POA: Diagnosis present

## 2022-02-18 DIAGNOSIS — K922 Gastrointestinal hemorrhage, unspecified: Secondary | ICD-10-CM | POA: Diagnosis not present

## 2022-02-18 DIAGNOSIS — R578 Other shock: Secondary | ICD-10-CM | POA: Diagnosis present

## 2022-02-18 DIAGNOSIS — K5731 Diverticulosis of large intestine without perforation or abscess with bleeding: Secondary | ICD-10-CM

## 2022-02-18 DIAGNOSIS — Z66 Do not resuscitate: Secondary | ICD-10-CM | POA: Diagnosis present

## 2022-02-18 DIAGNOSIS — K6289 Other specified diseases of anus and rectum: Secondary | ICD-10-CM | POA: Diagnosis not present

## 2022-02-18 DIAGNOSIS — I700 Atherosclerosis of aorta: Secondary | ICD-10-CM | POA: Diagnosis present

## 2022-02-18 DIAGNOSIS — Z888 Allergy status to other drugs, medicaments and biological substances status: Secondary | ICD-10-CM | POA: Diagnosis not present

## 2022-02-18 DIAGNOSIS — Z83438 Family history of other disorder of lipoprotein metabolism and other lipidemia: Secondary | ICD-10-CM | POA: Diagnosis not present

## 2022-02-18 DIAGNOSIS — D72829 Elevated white blood cell count, unspecified: Secondary | ICD-10-CM | POA: Diagnosis present

## 2022-02-18 DIAGNOSIS — K641 Second degree hemorrhoids: Secondary | ICD-10-CM

## 2022-02-18 DIAGNOSIS — K625 Hemorrhage of anus and rectum: Secondary | ICD-10-CM | POA: Diagnosis not present

## 2022-02-18 DIAGNOSIS — Z91013 Allergy to seafood: Secondary | ICD-10-CM | POA: Diagnosis not present

## 2022-02-18 DIAGNOSIS — K760 Fatty (change of) liver, not elsewhere classified: Secondary | ICD-10-CM | POA: Diagnosis present

## 2022-02-18 DIAGNOSIS — Z79899 Other long term (current) drug therapy: Secondary | ICD-10-CM | POA: Diagnosis not present

## 2022-02-18 DIAGNOSIS — Z882 Allergy status to sulfonamides status: Secondary | ICD-10-CM | POA: Diagnosis not present

## 2022-02-18 DIAGNOSIS — Z9049 Acquired absence of other specified parts of digestive tract: Secondary | ICD-10-CM | POA: Diagnosis not present

## 2022-02-18 HISTORY — PX: FLEXIBLE SIGMOIDOSCOPY: SHX5431

## 2022-02-18 HISTORY — PX: ESOPHAGOGASTRODUODENOSCOPY: SHX5428

## 2022-02-18 HISTORY — PX: BIOPSY: SHX5522

## 2022-02-18 LAB — CBC
HCT: 43.3 % (ref 36.0–46.0)
HCT: 42.9 % (ref 36.0–46.0)
HCT: 45 % (ref 36.0–46.0)
Hemoglobin: 14.6 g/dL (ref 12.0–15.0)
Hemoglobin: 14.3 g/dL (ref 12.0–15.0)
Hemoglobin: 15 g/dL (ref 12.0–15.0)
MCH: 28.9 pg (ref 26.0–34.0)
MCH: 28.7 pg (ref 26.0–34.0)
MCH: 28.7 pg (ref 26.0–34.0)
MCHC: 33.7 g/dL (ref 30.0–36.0)
MCHC: 33.3 g/dL (ref 30.0–36.0)
MCHC: 33.3 g/dL (ref 30.0–36.0)
MCV: 85.6 fL (ref 80.0–100.0)
MCV: 86 fL (ref 80.0–100.0)
MCV: 86 fL (ref 80.0–100.0)
Platelets: 143 10*3/uL — ABNORMAL LOW (ref 150–400)
Platelets: 143 10*3/uL — ABNORMAL LOW (ref 150–400)
Platelets: 142 10*3/uL — ABNORMAL LOW (ref 150–400)
RBC: 5.06 MIL/uL (ref 3.87–5.11)
RBC: 4.99 MIL/uL (ref 3.87–5.11)
RBC: 5.23 MIL/uL — ABNORMAL HIGH (ref 3.87–5.11)
RDW: 14.6 % (ref 11.5–15.5)
RDW: 14.5 % (ref 11.5–15.5)
RDW: 14.6 % (ref 11.5–15.5)
WBC: 11.8 10*3/uL — ABNORMAL HIGH (ref 4.0–10.5)
WBC: 11.5 10*3/uL — ABNORMAL HIGH (ref 4.0–10.5)
WBC: 14.7 10*3/uL — ABNORMAL HIGH (ref 4.0–10.5)
nRBC: 0 % (ref 0.0–0.2)
nRBC: 0 % (ref 0.0–0.2)
nRBC: 0 % (ref 0.0–0.2)

## 2022-02-18 LAB — MRSA NEXT GEN BY PCR, NASAL: MRSA by PCR Next Gen: NOT DETECTED

## 2022-02-18 SURGERY — EGD (ESOPHAGOGASTRODUODENOSCOPY)
Anesthesia: Monitor Anesthesia Care

## 2022-02-18 MED ORDER — PHENYLEPHRINE HCL (PRESSORS) 10 MG/ML IV SOLN
INTRAVENOUS | Status: DC | PRN
  Administered 2022-02-18: 160 ug via INTRAVENOUS

## 2022-02-18 MED ORDER — CHLORHEXIDINE GLUCONATE CLOTH 2 % EX PADS
6.0000 | MEDICATED_PAD | Freq: Every day | CUTANEOUS | Status: DC
  Administered 2022-02-18 – 2022-02-20 (×3): 6 via TOPICAL

## 2022-02-18 MED ORDER — BISACODYL 5 MG PO TBEC
10.0000 mg | DELAYED_RELEASE_TABLET | Freq: Once | ORAL | Status: AC
  Administered 2022-02-18: 10 mg via ORAL
  Filled 2022-02-18: qty 2

## 2022-02-18 MED ORDER — SODIUM CHLORIDE 0.9 % IV SOLN: INTRAVENOUS | Status: DC

## 2022-02-18 MED ORDER — PROPOFOL 500 MG/50ML IV EMUL
INTRAVENOUS | Status: DC | PRN
  Administered 2022-02-18: 150 ug/kg/min via INTRAVENOUS

## 2022-02-18 MED ORDER — PEG-KCL-NACL-NASULF-NA ASC-C 100 G PO SOLR
0.5000 | ORAL | Status: AC
  Administered 2022-02-18 – 2022-02-19 (×2): 100 g via ORAL
  Filled 2022-02-18: qty 1

## 2022-02-18 MED ORDER — PANTOPRAZOLE SODIUM 40 MG IV SOLR
40.0000 mg | Freq: Two times a day (BID) | INTRAVENOUS | Status: DC
  Administered 2022-02-18 – 2022-02-19 (×4): 40 mg via INTRAVENOUS
  Filled 2022-02-18 (×4): qty 10

## 2022-02-18 MED ORDER — METOCLOPRAMIDE HCL 5 MG/ML IJ SOLN
INTRAMUSCULAR | Status: AC
  Filled 2022-02-18: qty 2

## 2022-02-18 MED ORDER — BISACODYL 5 MG PO TBEC: 10.0000 mg | DELAYED_RELEASE_TABLET | Freq: Once | ORAL | Status: DC

## 2022-02-18 MED ORDER — PREDNISONE 20 MG PO TABS: 50.0000 mg | ORAL_TABLET | Freq: Once | ORAL | Status: DC

## 2022-02-18 MED ORDER — LIDOCAINE 5 % EX PTCH
2.0000 | MEDICATED_PATCH | CUTANEOUS | Status: DC
  Administered 2022-02-18 – 2022-02-19 (×2): 2 via TRANSDERMAL
  Filled 2022-02-18: qty 2

## 2022-02-18 MED ORDER — METOCLOPRAMIDE HCL 5 MG/ML IJ SOLN
10.0000 mg | Freq: Once | INTRAMUSCULAR | Status: AC
  Administered 2022-02-18: 10 mg via INTRAVENOUS
  Filled 2022-02-18: qty 2

## 2022-02-18 MED ORDER — ACETAMINOPHEN 325 MG PO TABS
650.0000 mg | ORAL_TABLET | Freq: Four times a day (QID) | ORAL | Status: DC | PRN
  Administered 2022-02-18: 650 mg via ORAL
  Filled 2022-02-18: qty 2

## 2022-02-18 MED ORDER — METHYLPREDNISOLONE SODIUM SUCC 40 MG IJ SOLR
40.0000 mg | Freq: Once | INTRAMUSCULAR | Status: AC
  Administered 2022-02-19: 40 mg via INTRAVENOUS
  Filled 2022-02-18 (×2): qty 1

## 2022-02-18 NOTE — Transfer of Care (Signed)
Immediate Anesthesia Transfer of Care Note  Patient: Tamara Robertson  Procedure(s) Performed: ESOPHAGOGASTRODUODENOSCOPY (EGD) BIOPSY FLEXIBLE SIGMOIDOSCOPY  Patient Location: PACU  Anesthesia Type:MAC  Level of Consciousness: sedated, patient cooperative and responds to stimulation  Airway & Oxygen Therapy: Patient Spontanous Breathing and Patient connected to face mask oxygen  Post-op Assessment: Report given to RN and Post -op Vital signs reviewed and stable  Post vital signs: Reviewed and stable  Last Vitals:  Vitals Value Taken Time  BP    Temp    Pulse    Resp    SpO2      Last Pain:  Vitals:   02/18/22 1246  TempSrc: Temporal  PainSc: 0-No pain         Complications: No notable events documented.

## 2022-02-18 NOTE — Op Note (Signed)
[facility] Patient Name: Tamara Robertson Procedure Date: 02/18/2022 MRN: [id number] Attending MD:  Mansouraty , MD Date of Birth: 01/24/1930 CSN: [id number] Age: 88 Admit Type: Inpatient Procedure:                Flexible Sigmoidoscopy Indications:              Hematochezia, Negative CT angiography Providers:                 Mansouraty, MD, Jenny Kappus, RN, Faustina                            Mbumina, Technician Referring MD:             Inpatient medical service, Jay M. Pyrtle, MD Medicines:                Monitored Anesthesia Care Complications:            No immediate complications. Estimated Blood Loss:     Estimated blood loss was minimal. Procedure:                Pre-Anesthesia Assessment:                           - Prior to the procedure, a History and Physical                            was performed, and patient medications and                            allergies were reviewed. The patient's tolerance of                            previous anesthesia was also reviewed. The risks                            and benefits of the procedure and the sedation                            options and risks were discussed with the patient.                            All questions were answered, and informed consent                            was obtained. Prior Anticoagulants: The patient has                            taken no previous anticoagulant or antiplatelet                            agents. ASA Grade Assessment: III - A patient with                            severe systemic disease. After reviewing the risks                              [facility] Patient Name: Tamara Robertson Procedure Date: 02/18/2022 MRN: [id number] Attending MD:  Mansouraty , MD Date of Birth: 01/24/1930 CSN: [id number] Age: 88 Admit Type: Inpatient Procedure:                Flexible Sigmoidoscopy Indications:              Hematochezia, Negative CT angiography Providers:                 Mansouraty, MD, Jenny Kappus, RN, Faustina                            Mbumina, Technician Referring MD:             Inpatient medical service, Jay M. Pyrtle, MD Medicines:                Monitored Anesthesia Care Complications:            No immediate complications. Estimated Blood Loss:     Estimated blood loss was minimal. Procedure:                Pre-Anesthesia Assessment:                           - Prior to the procedure, a History and Physical                            was performed, and patient medications and                            allergies were reviewed. The patient's tolerance of                            previous anesthesia was also reviewed. The risks                            and benefits of the procedure and the sedation                            options and risks were discussed with the patient.                            All questions were answered, and informed consent                            was obtained. Prior Anticoagulants: The patient has                            taken no previous anticoagulant or antiplatelet                            agents. ASA Grade Assessment: III - A patient with                            severe systemic disease. After reviewing the risks                              [facility] Patient Name: Tamara Robertson Procedure Date: 02/18/2022 MRN: [id number] Attending MD: Justice Britain , MD Date of Birth: 1929-07-11 CSN: [id number] Age: 84 Admit Type: Inpatient Procedure:                Flexible Sigmoidoscopy Indications:              Hematochezia, Negative CT angiography Providers:                Justice Britain, MD, Carlyn Reichert, RN, Janee Morn, Technician Referring MD:             Inpatient medical service, Lajuan Lines. Pyrtle, MD Medicines:                Monitored Anesthesia Care Complications:            No immediate complications. Estimated Blood Loss:     Estimated blood loss was minimal. Procedure:                Pre-Anesthesia Assessment:                           - Prior to the procedure, a History and Physical                            was performed, and patient medications and                            allergies were reviewed. The patient's tolerance of                            previous anesthesia was also reviewed. The risks                            and benefits of the procedure and the sedation                            options and risks were discussed with the patient.                            All questions were answered, and informed consent                            was obtained. Prior Anticoagulants: The patient has                            taken no previous anticoagulant or antiplatelet                            agents. ASA Grade Assessment: III - A patient with                            severe systemic disease. After reviewing the risks  [facility] Patient Name: Tamara Robertson Procedure Date: 02/18/2022 MRN: [id number] Attending MD:  Mansouraty , MD Date of Birth: 01/24/1930 CSN: [id number] Age: 88 Admit Type: Inpatient Procedure:                Flexible Sigmoidoscopy Indications:              Hematochezia, Negative CT angiography Providers:                 Mansouraty, MD, Jenny Kappus, RN, Faustina                            Mbumina, Technician Referring MD:             Inpatient medical service, Jay M. Pyrtle, MD Medicines:                Monitored Anesthesia Care Complications:            No immediate complications. Estimated Blood Loss:     Estimated blood loss was minimal. Procedure:                Pre-Anesthesia Assessment:                           - Prior to the procedure, a History and Physical                            was performed, and patient medications and                            allergies were reviewed. The patient's tolerance of                            previous anesthesia was also reviewed. The risks                            and benefits of the procedure and the sedation                            options and risks were discussed with the patient.                            All questions were answered, and informed consent                            was obtained. Prior Anticoagulants: The patient has                            taken no previous anticoagulant or antiplatelet                            agents. ASA Grade Assessment: III - A patient with                            severe systemic disease. After reviewing the risks                              [facility] Patient Name: Tamara Robertson Procedure Date: 02/18/2022 MRN: [id number] Attending MD:  Mansouraty , MD Date of Birth: 01/24/1930 CSN: [id number] Age: 88 Admit Type: Inpatient Procedure:                Flexible Sigmoidoscopy Indications:              Hematochezia, Negative CT angiography Providers:                 Mansouraty, MD, Jenny Kappus, RN, Faustina                            Mbumina, Technician Referring MD:             Inpatient medical service, Jay M. Pyrtle, MD Medicines:                Monitored Anesthesia Care Complications:            No immediate complications. Estimated Blood Loss:     Estimated blood loss was minimal. Procedure:                Pre-Anesthesia Assessment:                           - Prior to the procedure, a History and Physical                            was performed, and patient medications and                            allergies were reviewed. The patient's tolerance of                            previous anesthesia was also reviewed. The risks                            and benefits of the procedure and the sedation                            options and risks were discussed with the patient.                            All questions were answered, and informed consent                            was obtained. Prior Anticoagulants: The patient has                            taken no previous anticoagulant or antiplatelet                            agents. ASA Grade Assessment: III - A patient with                            severe systemic disease. After reviewing the risks  

## 2022-02-18 NOTE — Anesthesia Preprocedure Evaluation (Addendum)
Anesthesia Evaluation  Patient identified by MRN, date of birth, ID band Patient awake    Reviewed: Allergy & Precautions, NPO status , Patient's Chart, lab work & pertinent test results, reviewed documented beta blocker date and time   Airway Mallampati: III  TM Distance: >3 FB Neck ROM: Limited    Dental no notable dental hx. (+) Teeth Intact, Caps, Dental Advisory Given   Pulmonary shortness of breath and with exertion,    Pulmonary exam normal breath sounds clear to auscultation       Cardiovascular hypertension, Pt. on medications and Pt. on home beta blockers + CAD and + Peripheral Vascular Disease  Normal cardiovascular exam+ dysrhythmias Atrial Fibrillation  Rhythm:Regular Rate:Normal  Hx/o 40% RCA lesion  Echo 01/05/21 1. Left ventricular ejection fraction, by estimation, is 60 to 65%. The left ventricle has normal function. The left ventricle has no regional wall motion abnormalities. Left ventricular diastolic parameters are consistent with Grade II diastolic dysfunction (pseudonormalization). There is basal septal hypertrophy noted.  2. Right ventricular systolic function is normal. The right ventricular size is normal. There is normal pulmonary artery systolic pressure.  3. The mitral valve is grossly normal. Mild to moderate mitral valve regurgitation. No evidence of mitral stenosis.  4. The aortic valve was not well visualized. Aortic valve regurgitation is mild to moderate. No aortic stenosis is present. Aortic regurgitation PHT measures 462 msec. No diastolic flow reversal.  5. Mild triscuspid regurgitation with two jets.   Comparison(s): A prior study was performed on 06/19/2017. Aortic regurgitation noted; Aortic valve gradients decrease from those seen in prior study.   EKG 02/17/21 NSR, no acute changes   Neuro/Psych  Headaches, PSYCHIATRIC DISORDERS Anxiety Depression Peripheral neuropathy Glaucoma Hearing  deficit bilateral  Neuromuscular disease    GI/Hepatic GERD  Medicated,Hepatic steatosis IBS Rectal bleeding   Endo/Other  Hyperlipidemia  Renal/GU negative Renal ROS Bladder dysfunction  OAB Bladder prolapse Urinary incontinence    Musculoskeletal  (+) Arthritis , Osteoarthritis,  Spinal stenosis   Abdominal   Peds  Hematology  (+) Blood dyscrasia, anemia , ITP   Anesthesia Other Findings   Reproductive/Obstetrics                            Anesthesia Physical Anesthesia Plan  ASA: 3 and emergent  Anesthesia Plan: MAC   Post-op Pain Management: Minimal or no pain anticipated   Induction: Intravenous  PONV Risk Score and Plan: 2 and Treatment may vary due to age or medical condition and Propofol infusion  Airway Management Planned: Natural Airway and Nasal Cannula  Additional Equipment: None  Intra-op Plan:   Post-operative Plan:   Informed Consent: I have reviewed the patients History and Physical, chart, labs and discussed the procedure including the risks, benefits and alternatives for the proposed anesthesia with the patient or authorized representative who has indicated his/her understanding and acceptance.   Patient has DNR.  Discussed DNR with patient and Suspend DNR.   Dental advisory given  Plan Discussed with: CRNA and Anesthesiologist  Anesthesia Plan Comments:        Anesthesia Quick Evaluation

## 2022-02-18 NOTE — Consult Note (Signed)
Gastroenterology Inpatient Consultation   Attending Requesting Consult Kathie Dike, [place] [facility] Day: 2  Reason for Consult Bright red blood per rectum and acute blood loss anemia    History of Present Illness  Tamara Robertson is a 88 y.o. female with a pmh significant for CAD, ITP, hypertension, hyperlipidemia, anxiety, glaucoma, arthritis, status post splenectomy, status post cholecystectomy, prior SBO, status post left hemicolectomy for diverticular stricture, pandiverticulosis, GERD, IBS-C.  The GI service is consulted for evaluation and management of bright red blood per rectum and acute blood loss anemia.  The patient states that she was in her normal state of health until yesterday morning.  She then began to develop acute painless rectal bleeding that persisted and called 911 and was brought into the emergency room.  She was initially hypotensive with a stable hemoglobin but was given 3 units of packed RBCs.  She has a IV contrast allergy and required premedication before a CT angio was performed.  This showed extensive pandiverticulosis with a left-sided hemicolectomy (from previous diverticular stricture) but no active source of bleeding.  Overnight into this morning she continues to have maroon/bright red blood per rectum.  Hemodynamically has remained stable.  Last hemoglobin at 7 AM was greater than 15.  The patient is having some mild lower abdominal discomfort and rectal discomfort as the bowels/blood are passing through.  She denies any significant nonsteroidals or BC/Goody powder use.  She uses Tylenol.  She cannot recall ever undergoing a upper endoscopy in the past.  She believes she has had a colonoscopy done in [place] after her left-sided hemicolectomy but does not know the results of that and so the last documented colonoscopy that I have in the system is from 2006 which was an incomplete procedure.  She has concerns about trying to take the colonoscopy  preparation, if required, as she does not have a tight sphincter tone at this point and has dealt with accidents in the past.  She is hopeful she will not need a colonoscopy.  GI Review of Systems Positive as above including cough (improved with Pepcid) Negative for dysphagia, odynophagia, nausea, vomiting, melena   Review of Systems  General: Denies fevers/chills/weight loss unintentionally Cardiovascular: Denies chest pain Pulmonary: Denies shortness of breath Gastroenterological: See HPI Genitourinary: Denies darkened urine or hematuria Hematological: Positive for history of easy bruising/bleeding due to ITP Dermatological: Denies jaundice Psychological: Mood is anxious   Histories  Past Medical History Past Medical History:  Diagnosis Date   Achilles tendonitis    Anxiety    Aortic atherosclerosis (HCC)    CAD (coronary artery disease)    RCA 40% stenosis cath 01/2011   Diverticular stricture [facility]) 2006   [place], HX OF    Diverticulosis    DYSLIPIDEMIA    Elevated LFTs    GERD    Glaucoma    Hepatic steatosis    HOH (hard of hearing)    Immune thrombocytopenic purpura ([place])    chronic - baseline 80-100K, on pred   Irritable bowel syndrome    Left ovarian cyst dx 01/2013 CT   working with gyn, ?malignant - elevated tumor marker OVA1   OSTEOARTHRITIS, KNEE, RIGHT    OSTEOPENIA    OVERACTIVE BLADDER    Plantar fasciitis    Prolapse of female bladder, acquired 11/2017   Torn rotator cuff    right shoulder   URINARY INCONTINENCE    Past Surgical History:  Procedure  Laterality Date   ABDOMINAL HYSTERECTOMY  1963   APPENDECTOMY  1956   CARDIAC CATHETERIZATION     CATARACT EXTRACTION, BILATERAL  10/2010   CHOLECYSTECTOMY N/A 09/16/2014   Procedure: LAPAROSCOPIC CHOLECYSTECTOMY ;  Surgeon: Rolm Bookbinder, MD;  Location: [facility];  Service: General;  Laterality: N/A;   KNEE ARTHROSCOPY Right    L pop PTA  10/2009   stent    LAPAROSCOPIC SIGMOID COLECTOMY  10/2005   SPLENECTOMY  1954   VARICOSE VEIN SURGERY Right 1962    Allergies Allergies  Allergen Reactions   Aspirin Other (See Comments)    ITP   Sulfa Antibiotics Other (See Comments)    dizziness   Contrast Media [Iodinated Contrast Media] Hives    CAT scan contrast only   Shellfish-Derived Products Hives    Other reaction(s): Other   Eliquis [Apixaban]     Rash    Fluoxetine     Did not feel well on it    Sulfamethoxazole-Trimethoprim     Other reaction(s): Unknown   Celebrex [Celecoxib] Other (See Comments)    Leg swelling   Celexa [Citalopram] Other (See Comments)    Felt out of it   Statins Other (See Comments)    Family History Family History  Problem Relation Age of Onset   Coronary artery disease Mother    Heart attack Mother 62   Hyperlipidemia Mother    Hypertension Mother    Stomach cancer Father    Hypertension Daughter    Hyperlipidemia Daughter    Arthritis Other        parent   Transient ischemic attack Other        parent    Social History Social History   Socioeconomic History   Marital status: Widowed    Spouse name: Not on file   Number of children: 2   Years of education: Not on file   Highest education level: Not on file  Occupational History   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 to be close to kids   Social Determinants of Health   Financial Resource Strain: [place]  (08/30/2021)   Overall Financial Resource Strain (CARDIA)    Difficulty of  Paying Living Expenses: Not hard at all  Food Insecurity: No Food Insecurity (08/30/2021)   Hunger Vital Sign    Worried About Running Out of Food in the Last Year: Never true    [place] in the Last Year: Never true  Transportation Needs: No Transportation Needs (08/30/2021)   PRAPARE - Hydrologist (Medical): No    Lack of Transportation (Non-Medical): No  Physical Activity: Inactive (08/05/2020)   Exercise Vital Sign    Days of Exercise per Week: 0 days    Minutes of Exercise per Session: 0 min  Stress: No Stress Concern Present (08/30/2021)   [place]    Feeling of Stress : Not  at all  Social Connections: Socially Isolated (08/30/2021)   Social Connection and Isolation Panel [NHANES]    Frequency of Communication with Friends and Family: More than three times a week    Frequency of Social Gatherings with Friends and Family: Once a week    Attends Religious Services: Never    Marine scientist or Organizations: No    Attends Archivist Meetings: Never    Marital Status: Widowed  Intimate Partner Violence: Not At Risk (08/30/2021)   Humiliation, Afraid, Rape, and Kick questionnaire    Fear of Current or Ex-Partner: No    Emotionally Abused: No    Physically Abused: No    Sexually Abused: No    Medications  Home Medications No current facility-administered medications on file prior to encounter.   Current Outpatient Medications on File Prior to Encounter  Medication Sig Dispense Refill   acetaminophen (TYLENOL) 500 MG tablet Take 500 mg by mouth every 6 (six) hours as needed for moderate pain.     Biotin 2500 MCG CAPS Take 1 capsule by mouth daily.      calcium carbonate (TUMS - DOSED IN MG ELEMENTAL CALCIUM) 500 MG chewable tablet Chew 1 tablet by mouth daily as needed for indigestion or heartburn.     cholecalciferol (VITAMIN D3) 25 MCG (1000 UNIT) tablet Take  1,000 Units by mouth daily.     escitalopram (LEXAPRO) 5 MG tablet Take 1 tablet (5 mg total) by mouth daily. 90 tablet 3   famotidine (PEPCID) 20 MG tablet Take 1 tablet (20 mg total) by mouth 2 (two) times daily as needed for heartburn or indigestion. 180 tablet 3   Flaxseed MISC Take 5 mLs by mouth daily. Take 1 tsp daily     furosemide (LASIX) 20 MG tablet Take 1.5 tablets (30 mg total) by mouth daily. 135 tablet 3   Glucos-Chond-Hyal Ac-Ca Fructo (MOVE FREE JOINT HEALTH ADVANCE PO) Take 1 tablet by mouth daily.     lidocaine (LMX) 4 % cream Apply 1 application  topically daily as needed (inflammation).     lidocaine 4 % Place 1 patch onto the skin daily.     Magnesium Oxide 250 MG TABS Take 1 tablet by mouth.     metoprolol succinate (TOPROL-XL) 25 MG 24 hr tablet TAKE 1/2 (ONE-HALF) TABLET BY MOUTH ONCE DAILY WITH OR IMMEDIATELY FOLLOWING A MEAL 45 tablet 10   Multiple Vitamin (MULTIVITAMIN) tablet Take 1 tablet by mouth daily.       polyethylene glycol (MIRALAX / GLYCOLAX) packet Take 17 g by mouth daily as needed for moderate constipation.      predniSONE (DELTASONE) 2.5 MG tablet Take 1 tablet (2.5 mg total) by mouth every other day. 45 tablet 3   Probiotic Product (PROBIOTIC DAILY PO) Take 1 tablet by mouth daily with breakfast.     PSYLLIUM PO Take 1 tablet by mouth daily as needed (constipation).     triamcinolone cream (KENALOG) 0.1 % Apply 1 Application topically 2 (two) times daily as needed (rash).     trolamine salicylate (ASPERCREME) 10 % cream Apply 1 application topically as needed for muscle pain.     [DISCONTINUED] metoprolol succinate (TOPROL-XL) 25 MG 24 hr tablet Take 1 tablet (25 mg total) by mouth daily. 30 tablet 1   Scheduled Inpatient Medications  lidocaine  1 patch Transdermal Q24H   methylPREDNISolone (SOLU-MEDROL) injection  40 mg Intravenous Once   pantoprazole (PROTONIX) IV  40 mg Intravenous Q12H   Continuous  Inpatient Infusions  sodium chloride      sodium chloride     lactated ringers 100 mL/hr at 02/18/22 0600   PRN Inpatient Medications    Physical Examination  BP (!) 175/56   Pulse 72   Temp 97.7 F (36.5 C) (Oral)   Resp 13   Ht '5\' 2"'$  (1.575 m)   Wt 65.8 kg   SpO2 95%   BMI 26.52 kg/m  GEN: Patient appears younger than stated age, she is sitting on the bedside commode with some active distress since she is passing blood, does not appear chronically ill PSYCH: Cooperative EYE: Conjunctivae pink, sclerae anicteric ENT: MMM CV: Tachycardic RESP: No audible wheezing GI: NABS, soft, protuberant abdomen, surgical scars present, NT/ND, without rebound GU: DRE deferred but I looked at the bedside commode which shows maroon/bright red blood MSK/EXT: Trace bilateral pedal edema SKIN: No jaundice NEURO:  Alert & Oriented x 3, no focal deficits   Review of Data  I reviewed the following data at the time of this encounter:  Laboratory Studies   Recent Labs  Lab 02/17/22 1415 02/17/22 1430  NA 140 142  K 3.6 3.7  CL 107 103  CO2 26  --   BUN 24* 23  CREATININE 0.94 0.90  GLUCOSE 123* 112*  CALCIUM 8.5*  --   MG 2.3  --    Recent Labs  Lab 02/17/22 1415  AST 22  ALT 14  ALKPHOS 57    Recent Labs  Lab 02/18/22 0057 02/18/22 0238 02/18/22 0729  WBC 11.8* 11.5* 14.7*  HGB 14.6 14.3 15.0  HCT 43.3 42.9 45.0  PLT 143* 143* 142*   Recent Labs  Lab 02/17/22 1415  INR 1.1   Imaging Studies  CT angio IMPRESSION: No evidence of active GI bleeding. Status post left hemicolectomy. Extensive colonic diverticulosis, without evidence of diverticulitis. Additional ancillary findings as above.  GI Procedures and Studies  2006 colonoscopy ([place]) Diverticular stricture in the mid sigmoid at 30 cm.  Normal mucosa otherwise.  Proceed to barium enema  Patient reports a possible colonoscopy done after this 2006 procedure and after her colectomy but we do not have access to those  records   Assessment  Ms. Patton is a 88 y.o. female with a pmh significant for CAD, ITP, hypertension, hyperlipidemia, anxiety, glaucoma, arthritis, status post splenectomy, status post cholecystectomy, prior SBO, status post left hemicolectomy for diverticular stricture, pandiverticulosis, GERD, IBS-C.  The GI service is consulted for evaluation and management of bright red blood per rectum and acute blood loss anemia.  The patient is hemodynamically stable at this time.  Not sure if this will remain the case however.  The etiology of her bright red blood per rectum and acute blood loss anemia is most likely diverticular in origin but her CT angiography was negative.  She has not had a colonoscopy in greater than 10 years but had not been experiencing issues for a while.  Thus the acute onset of bleeding and persistent nature of it makes me suspect that diverticular bleeding is most likely.  With that being said she is still passing blood today.  We are going to want to rule out a rapid upper GI source of bleeding and so we will perform a endoscopy and a flexible sigmoidoscopy in an attempt of trying to evaluate the colon.  If we do not find a source of bleeding on the upper and the flexible sigmoidoscopy is either incomplete or not able  to evaluate things, then I likely would send her back for a repeat CT angiography to see if we can find a source of bleeding.  I am going to premedicate her with IV Solu-Medrol and she will need oral Benadryl to be administered as well prior but I think this is a little bit safer scan than having her continue to bleed with a tagged RBC scan for up to 4 hours.  As such, lets see what we can find on EGD/flexible sigmoidoscopy.  She is prepared for the potential need of a colonoscopy tomorrow but I am not sure we will have that luxury.  The risks and benefits of endoscopic evaluation were discussed with the patient; these include but are not limited to the risk of perforation,  infection, bleeding, missed lesions, lack of diagnosis, severe illness requiring hospitalization, as well as anesthesia and sedation related illnesses.  I had an extensive discussion with the patient's daughter over the phone today and they both agree with this plan of action.  The patient and/or family is agreeable to proceed.  All patient questions were answered to the best of my ability, and the patient agrees to the aforementioned plan of action with follow-up as indicated.   Plan/Recommendations  Maintain n.p.o. status EGD/flexible sigmoidoscopy later this morning pending anesthesia availability Tapwater enema to be given 1 hour before procedure IV Solu-Medrol x1 dose this morning in preemptive measure as patient may need repeat CT angiography if negative endoscopic work-up Initiate IV PPI 40 mg twice daily If negative work-up will need to consider colonoscopy and full preparation tomorrow   Thank you for this consult.  We will continue to follow.  Please page/call with questions or concerns.   Justice Britain, MD [facility] Advanced [facility]oscopy Office # [phone]

## 2022-02-18 NOTE — Op Note (Signed)
[facility] Patient Name: Tamara Robertson Procedure Date: 02/18/2022 MRN: [id number] Attending MD: Corliss Parish , MD Date of Birth: [birth year] CSN: [id number] Age: 88 Admit Type: Inpatient Procedure:                Upper GI endoscopy Indications:              Hematochezia, Active gastrointestinal bleeding Providers:                Corliss Parish, MD, Zoe Lan, RN, Melany Guernsey, Technician Referring MD:             Inpatient medical service, Carie Caddy. Pyrtle, MD Medicines:                Monitored Anesthesia Care Complications:            No immediate complications. Estimated Blood Loss:     Estimated blood loss was minimal. Procedure:                Pre-Anesthesia Assessment:                           - Prior to the procedure, a History and Physical                            was performed, and patient medications and                            allergies were reviewed. The patient's tolerance of                            previous anesthesia was also reviewed. The risks                            and benefits of the procedure and the sedation                            options and risks were discussed with the patient.                            All questions were answered, and informed consent                            was obtained. Prior Anticoagulants: The patient has                            taken no previous anticoagulant or antiplatelet                            agents. ASA Grade Assessment: III - A patient with                            severe systemic disease. After reviewing the risks  and benefits, the patient was deemed in                            satisfactory condition to undergo the procedure.                           After obtaining informed consent, the endoscope was                            passed under direct vision. Throughout the                            procedure, the  patient's blood pressure, pulse, and                            oxygen saturations were monitored continuously. The                            [id number] ([id number]) Olympus therapeutic endoscope                            was introduced through the mouth, and advanced to                            the second part of duodenum. The upper GI endoscopy                            was accomplished without difficulty. The patient                            tolerated the procedure. Scope In: Scope Out: Findings:      No gross lesions were noted in the entire esophagus.      The Z-line was irregular and was found 35 cm from the incisors.      Patchy mild inflammation characterized by erosions and friability was       found in the entire examined stomach. Biopsies were taken with a cold       forceps for histology and Helicobacter pylori testing.      No gross lesions were noted in the duodenal bulb, in the first portion       of the duodenum and in the second portion of the duodenum. Impression:               - No gross lesions in esophagus. Z-line irregular,                            35 cm from the incisors.                           - Gastritis. Biopsied.                           - No gross lesions in the duodenal bulb, in the  first portion of the duodenum and in the second                            portion of the duodenum. Moderate Sedation:      Not Applicable - Patient had care per Anesthesia. Recommendation:           - Proceed to scheduled Flexible Sigmoidoscopy.                           - Observe patient's clinical course.                           - Transition to PO PPI 40 mg daily.                           - Continue present medications.                           - Await pathology results.                           - The findings and recommendations were discussed                            with the patient.                           - The findings and  recommendations were discussed                            with the patient's family.                           - The findings and recommendations were discussed                            with the referring physician. Procedure Code(s):        --- Professional ---                           [phone], Esophagogastroduodenoscopy, flexible,                            transoral; with biopsy, single or multiple Diagnosis Code(s):        --- Professional ---                           K22.8, Other specified diseases of esophagus                           K29.70, Gastritis, unspecified, without bleeding                           K92.1, Melena (includes Hematochezia)                           K92.2, Gastrointestinal hemorrhage, unspecified CPT copyright  2019 American Medical Association. All rights reserved. The codes documented in this report are preliminary and upon coder review may  be revised to meet current compliance requirements. Corliss Parish, MD 02/18/2022 1:59:06 PM Number of Addenda: 0

## 2022-02-18 NOTE — H&P (View-Only) (Signed)
Gastroenterology Inpatient Consultation   Attending Requesting Consult Kathie Dike, [facility] Day: 2  Reason for Consult Bright red blood per rectum and acute blood loss anemia    History of Present Illness  Tamara Robertson is a 88 y.o. female with a pmh significant for CAD, ITP, hypertension, hyperlipidemia, anxiety, glaucoma, arthritis, status post splenectomy, status post cholecystectomy, prior SBO, status post left hemicolectomy for diverticular stricture, pandiverticulosis, GERD, IBS-C.  The GI service is consulted for evaluation and management of bright red blood per rectum and acute blood loss anemia.  The patient states that she was in her normal state of health until yesterday morning.  She then began to develop acute painless rectal bleeding that persisted and called 911 and was brought into the emergency room.  She was initially hypotensive with a stable hemoglobin but was given 3 units of packed RBCs.  She has a IV contrast allergy and required premedication before a CT angio was performed.  This showed extensive pandiverticulosis with a left-sided hemicolectomy (from previous diverticular stricture) but no active source of bleeding.  Overnight into this morning she continues to have maroon/bright red blood per rectum.  Hemodynamically has remained stable.  Last hemoglobin at 7 AM was greater than 15.  The patient is having some mild lower abdominal discomfort and rectal discomfort as the bowels/blood are passing through.  She denies any significant nonsteroidals or BC/Goody powder use.  She uses Tylenol.  She cannot recall ever undergoing a upper endoscopy in the past.  She believes she has had a colonoscopy done in [place] after her left-sided hemicolectomy but does not know the results of that and so the last documented colonoscopy that I have in the system is from 2006 which was an incomplete procedure.  She has concerns about trying to take the colonoscopy  preparation, if required, as she does not have a tight sphincter tone at this point and has dealt with accidents in the past.  She is hopeful she will not need a colonoscopy.  GI Review of Systems Positive as above including cough (improved with Pepcid) Negative for dysphagia, odynophagia, nausea, vomiting, melena   Review of Systems  General: Denies fevers/chills/weight loss unintentionally Cardiovascular: Denies chest pain Pulmonary: Denies shortness of breath Gastroenterological: See HPI Genitourinary: Denies darkened urine or hematuria Hematological: Positive for history of easy bruising/bleeding due to ITP Dermatological: Denies jaundice Psychological: Mood is anxious   Histories  Past Medical History Past Medical History:  Diagnosis Date   Achilles tendonitis    Anxiety    Aortic atherosclerosis (HCC)    CAD (coronary artery disease)    RCA 40% stenosis cath 01/2011   Diverticular stricture [facility]) 2006   [place], HX OF    Diverticulosis    DYSLIPIDEMIA    Elevated LFTs    GERD    Glaucoma    Hepatic steatosis    HOH (hard of hearing)    Immune thrombocytopenic purpura ([place])    chronic - baseline 80-100K, on pred   Irritable bowel syndrome    Left ovarian cyst dx 01/2013 CT   working with gyn, ?malignant - elevated tumor marker OVA1   OSTEOARTHRITIS, KNEE, RIGHT    OSTEOPENIA    OVERACTIVE BLADDER    Plantar fasciitis    Prolapse of female bladder, acquired 11/2017   Torn rotator cuff    right shoulder   URINARY INCONTINENCE    Past Surgical History:  Procedure  Laterality Date   ABDOMINAL HYSTERECTOMY  1963   APPENDECTOMY  1956   CARDIAC CATHETERIZATION     CATARACT EXTRACTION, BILATERAL  10/2010   CHOLECYSTECTOMY N/A 09/16/2014   Procedure: LAPAROSCOPIC CHOLECYSTECTOMY ;  Surgeon: Rolm Bookbinder, MD;  Location: [facility];  Service: General;  Laterality: N/A;   KNEE ARTHROSCOPY Right    L pop PTA  10/2009   stent    LAPAROSCOPIC SIGMOID COLECTOMY  10/2005   SPLENECTOMY  1954   VARICOSE VEIN SURGERY Right 1962    Allergies Allergies  Allergen Reactions   Aspirin Other (See Comments)    ITP   Sulfa Antibiotics Other (See Comments)    dizziness   Contrast Media [Iodinated Contrast Media] Hives    CAT scan contrast only   Shellfish-Derived Products Hives    Other reaction(s): Other   Eliquis [Apixaban]     Rash    Fluoxetine     Did not feel well on it    Sulfamethoxazole-Trimethoprim     Other reaction(s): Unknown   Celebrex [Celecoxib] Other (See Comments)    Leg swelling   Celexa [Citalopram] Other (See Comments)    Felt out of it   Statins Other (See Comments)    Family History Family History  Problem Relation Age of Onset   Coronary artery disease Mother    Heart attack Mother 87   Hyperlipidemia Mother    Hypertension Mother    Stomach cancer Father    Hypertension Daughter    Hyperlipidemia Daughter    Arthritis Other        parent   Transient ischemic attack Other        parent    Social History Social History   Socioeconomic History   Marital status: Widowed    Spouse name: Not on file   Number of children: 2   Years of education: Not on file   Highest education level: Not on file  Occupational History   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 to be close to kids   Social Determinants of Health   Financial Resource Strain: [place]  (08/30/2021)   Overall Financial Resource Strain (CARDIA)    Difficulty of  Paying Living Expenses: Not hard at all  Food Insecurity: No Food Insecurity (08/30/2021)   Hunger Vital Sign    Worried About Running Out of Food in the Last Year: Never true    [place] in the Last Year: Never true  Transportation Needs: No Transportation Needs (08/30/2021)   PRAPARE - Hydrologist (Medical): No    Lack of Transportation (Non-Medical): No  Physical Activity: Inactive (08/05/2020)   Exercise Vital Sign    Days of Exercise per Week: 0 days    Minutes of Exercise per Session: 0 min  Stress: No Stress Concern Present (08/30/2021)   [place]    Feeling of Stress : Not  at all  Social Connections: Socially Isolated (08/30/2021)   Social Connection and Isolation Panel [NHANES]    Frequency of Communication with Friends and Family: More than three times a week    Frequency of Social Gatherings with Friends and Family: Once a week    Attends Religious Services: Never    Marine scientist or Organizations: No    Attends Archivist Meetings: Never    Marital Status: Widowed  Intimate Partner Violence: Not At Risk (08/30/2021)   Humiliation, Afraid, Rape, and Kick questionnaire    Fear of Current or Ex-Partner: No    Emotionally Abused: No    Physically Abused: No    Sexually Abused: No    Medications  Home Medications No current facility-administered medications on file prior to encounter.   Current Outpatient Medications on File Prior to Encounter  Medication Sig Dispense Refill   acetaminophen (TYLENOL) 500 MG tablet Take 500 mg by mouth every 6 (six) hours as needed for moderate pain.     Biotin 2500 MCG CAPS Take 1 capsule by mouth daily.      calcium carbonate (TUMS - DOSED IN MG ELEMENTAL CALCIUM) 500 MG chewable tablet Chew 1 tablet by mouth daily as needed for indigestion or heartburn.     cholecalciferol (VITAMIN D3) 25 MCG (1000 UNIT) tablet Take  1,000 Units by mouth daily.     escitalopram (LEXAPRO) 5 MG tablet Take 1 tablet (5 mg total) by mouth daily. 90 tablet 3   famotidine (PEPCID) 20 MG tablet Take 1 tablet (20 mg total) by mouth 2 (two) times daily as needed for heartburn or indigestion. 180 tablet 3   Flaxseed MISC Take 5 mLs by mouth daily. Take 1 tsp daily     furosemide (LASIX) 20 MG tablet Take 1.5 tablets (30 mg total) by mouth daily. 135 tablet 3   Glucos-Chond-Hyal Ac-Ca Fructo (MOVE FREE JOINT HEALTH ADVANCE PO) Take 1 tablet by mouth daily.     lidocaine (LMX) 4 % cream Apply 1 application  topically daily as needed (inflammation).     lidocaine 4 % Place 1 patch onto the skin daily.     Magnesium Oxide 250 MG TABS Take 1 tablet by mouth.     metoprolol succinate (TOPROL-XL) 25 MG 24 hr tablet TAKE 1/2 (ONE-HALF) TABLET BY MOUTH ONCE DAILY WITH OR IMMEDIATELY FOLLOWING A MEAL 45 tablet 10   Multiple Vitamin (MULTIVITAMIN) tablet Take 1 tablet by mouth daily.       polyethylene glycol (MIRALAX / GLYCOLAX) packet Take 17 g by mouth daily as needed for moderate constipation.      predniSONE (DELTASONE) 2.5 MG tablet Take 1 tablet (2.5 mg total) by mouth every other day. 45 tablet 3   Probiotic Product (PROBIOTIC DAILY PO) Take 1 tablet by mouth daily with breakfast.     PSYLLIUM PO Take 1 tablet by mouth daily as needed (constipation).     triamcinolone cream (KENALOG) 0.1 % Apply 1 Application topically 2 (two) times daily as needed (rash).     trolamine salicylate (ASPERCREME) 10 % cream Apply 1 application topically as needed for muscle pain.     [DISCONTINUED] metoprolol succinate (TOPROL-XL) 25 MG 24 hr tablet Take 1 tablet (25 mg total) by mouth daily. 30 tablet 1   Scheduled Inpatient Medications  lidocaine  1 patch Transdermal Q24H   methylPREDNISolone (SOLU-MEDROL) injection  40 mg Intravenous Once   pantoprazole (PROTONIX) IV  40 mg Intravenous Q12H   Continuous  Inpatient Infusions  sodium chloride      sodium chloride     lactated ringers 100 mL/hr at 02/18/22 0600   PRN Inpatient Medications    Physical Examination  BP (!) 175/56   Pulse 72   Temp 97.7 F (36.5 C) (Oral)   Resp 13   Ht '5\' 2"'$  (1.575 m)   Wt 65.8 kg   SpO2 95%   BMI 26.52 kg/m  GEN: Patient appears younger than stated age, she is sitting on the bedside commode with some active distress since she is passing blood, does not appear chronically ill PSYCH: Cooperative EYE: Conjunctivae pink, sclerae anicteric ENT: MMM CV: Tachycardic RESP: No audible wheezing GI: NABS, soft, protuberant abdomen, surgical scars present, NT/ND, without rebound GU: DRE deferred but I looked at the bedside commode which shows maroon/bright red blood MSK/EXT: Trace bilateral pedal edema SKIN: No jaundice NEURO:  Alert & Oriented x 3, no focal deficits   Review of Data  I reviewed the following data at the time of this encounter:  Laboratory Studies   Recent Labs  Lab 02/17/22 1415 02/17/22 1430  NA 140 142  K 3.6 3.7  CL 107 103  CO2 26  --   BUN 24* 23  CREATININE 0.94 0.90  GLUCOSE 123* 112*  CALCIUM 8.5*  --   MG 2.3  --    Recent Labs  Lab 02/17/22 1415  AST 22  ALT 14  ALKPHOS 57    Recent Labs  Lab 02/18/22 0057 02/18/22 0238 02/18/22 0729  WBC 11.8* 11.5* 14.7*  HGB 14.6 14.3 15.0  HCT 43.3 42.9 45.0  PLT 143* 143* 142*   Recent Labs  Lab 02/17/22 1415  INR 1.1   Imaging Studies  CT angio IMPRESSION: No evidence of active GI bleeding. Status post left hemicolectomy. Extensive colonic diverticulosis, without evidence of diverticulitis. Additional ancillary findings as above.  GI Procedures and Studies  2006 colonoscopy ([place]) Diverticular stricture in the mid sigmoid at 30 cm.  Normal mucosa otherwise.  Proceed to barium enema  Patient reports a possible colonoscopy done after this 2006 procedure and after her colectomy but we do not have access to those  records   Assessment  Ms. Litaker is a 88 y.o. female with a pmh significant for CAD, ITP, hypertension, hyperlipidemia, anxiety, glaucoma, arthritis, status post splenectomy, status post cholecystectomy, prior SBO, status post left hemicolectomy for diverticular stricture, pandiverticulosis, GERD, IBS-C.  The GI service is consulted for evaluation and management of bright red blood per rectum and acute blood loss anemia.  The patient is hemodynamically stable at this time.  Not sure if this will remain the case however.  The etiology of her bright red blood per rectum and acute blood loss anemia is most likely diverticular in origin but her CT angiography was negative.  She has not had a colonoscopy in greater than 10 years but had not been experiencing issues for a while.  Thus the acute onset of bleeding and persistent nature of it makes me suspect that diverticular bleeding is most likely.  With that being said she is still passing blood today.  We are going to want to rule out a rapid upper GI source of bleeding and so we will perform a endoscopy and a flexible sigmoidoscopy in an attempt of trying to evaluate the colon.  If we do not find a source of bleeding on the upper and the flexible sigmoidoscopy is either incomplete or not able  to evaluate things, then I likely would send her back for a repeat CT angiography to see if we can find a source of bleeding.  I am going to premedicate her with IV Solu-Medrol and she will need oral Benadryl to be administered as well prior but I think this is a little bit safer scan than having her continue to bleed with a tagged RBC scan for up to 4 hours.  As such, lets see what we can find on EGD/flexible sigmoidoscopy.  She is prepared for the potential need of a colonoscopy tomorrow but I am not sure we will have that luxury.  The risks and benefits of endoscopic evaluation were discussed with the patient; these include but are not limited to the risk of perforation,  infection, bleeding, missed lesions, lack of diagnosis, severe illness requiring hospitalization, as well as anesthesia and sedation related illnesses.  I had an extensive discussion with the patient's daughter over the phone today and they both agree with this plan of action.  The patient and/or family is agreeable to proceed.  All patient questions were answered to the best of my ability, and the patient agrees to the aforementioned plan of action with follow-up as indicated.   Plan/Recommendations  Maintain n.p.o. status EGD/flexible sigmoidoscopy later this morning pending anesthesia availability Tapwater enema to be given 1 hour before procedure IV Solu-Medrol x1 dose this morning in preemptive measure as patient may need repeat CT angiography if negative endoscopic work-up Initiate IV PPI 40 mg twice daily If negative work-up will need to consider colonoscopy and full preparation tomorrow   Thank you for this consult.  We will continue to follow.  Please page/call with questions or concerns.   Justice Britain, MD [facility] Advanced Endoscopy Office # [phone]

## 2022-02-18 NOTE — Progress Notes (Signed)
PROGRESS NOTE    Tamara Robertson  QIH:[id number] DOB: [birth year] DOA: 02/17/2022 PCP: Pincus Sanes, MD    Brief Narrative:  88 y/o female with history of paroxysmal atrial fibrillation, not on anticoagulation due to history of prior GI bleed history of ITP on chronic steroid therapy, nonobstructive coronary artery disease, presents to the hospital with blood per rectum.  She was transfused 3 units PRBC on admission.  Initial CT abdomen did not show any evidence of active bleeding.  GI is following and plans on further work-up with EGD and flexible sigmoidoscopy.  May need to consider colonoscopy if no obvious source of bleeding is found.   Assessment & Plan:   Principal Problem:   Acute lower GI bleeding Active Problems:   Hypertension   CAD (coronary artery disease)   Leukocytosis   Chronic ITP (idiopathic thrombocytopenia) (HCC)   Acute GI bleed -suspect lower GI bleeding, although CT abd done on admission did not show source of active bleed. She was noted to have significant diverticulosis. She is s/p left hemicolectomy -GI following, plans are for possible EGD and flex sig today -on PPI BID -may need colonoscopy if work up unrevealing  Acute blood loss anemia -hemoglobin of 11.1 on admission -she was transfused 3 unit prbc on admit -hemoglobin this AM is 15 -continue to monitor  Paroxysmal A fib -HR currently stable -holding metoprolol for now due to concerns for ongoing bleeding and risk of hypotension -she is not on chronic anticoagulation due to prior history of GI bleeding  CAD -stable, no complaints of chest pain  History of ITP -platelet count is currently stable -she is on prednisone 2.5mg  every other day -currently getting IV steroids for contrast allergy prophylaxis -will restart prednisone when able   Chronic LE edema -on chronic lasix -hold for now in the setting of bleeding   DVT prophylaxis: SCDs Start: 02/17/22 2337  Code Status: DNR Family  Communication: discussed with patient Disposition Plan: Status is: Observation The patient will require care spanning > 2 midnights and should be moved to inpatient because: continued work up and monitoring for GI bleeding     Consultants:  Adult nurse GI  Procedures:    Antimicrobials:      Subjective: Reports having several bloody bowel movements since 6:30am , no abdominal pain, no vomiting  Objective: Vitals:   02/18/22 0400 02/18/22 0600 02/18/22 0800 02/18/22 0900  BP: (!) 148/53 (!) 175/56  (!) 163/50  Pulse: 72 72  79  Resp: 12 13  14   Temp: 98.6 F (37 C)  97.7 F (36.5 C)   TempSrc: Oral  Oral   SpO2: 94% 95%  95%  Weight:      Height:        Intake/Output Summary (Last 24 hours) at 02/18/2022 0950 Last data filed at 02/18/2022 0600 Gross per 24 hour  Intake 3271.9 ml  Output --  Net 3271.9 ml   Filed Weights   02/17/22 1346  Weight: 65.8 kg    Examination:  General exam: Appears calm and comfortable  Respiratory system: Clear to auscultation. Respiratory effort normal. Cardiovascular system: S1 & S2 heard, RRR. No JVD, murmurs, rubs, gallops or clicks. No pedal edema. Gastrointestinal system: Abdomen is nondistended, soft and nontender. No organomegaly or masses felt. Normal bowel sounds heard. Central nervous system: Alert and oriented. No focal neurological deficits. Extremities: Symmetric 5 x 5 power. Skin: No rashes, lesions or ulcers Psychiatry: Judgement and insight appear normal. Mood & affect appropriate.  Data Reviewed: I have personally reviewed following labs and imaging studies  CBC: Recent Labs  Lab 02/17/22 1415 02/17/22 1430 02/18/22 0057 02/18/22 0238 02/18/22 0729  WBC 11.5*  --  11.8* 11.5* 14.7*  NEUTROABS 4.9  --   --   --   --   HGB 11.1* 11.6* 14.6 14.3 15.0  HCT 34.2* 34.0* 43.3 42.9 45.0  MCV 85.9  --  85.6 86.0 86.0  PLT 146*  --  143* 143* 142*   Basic Metabolic Panel: Recent Labs  Lab 02/17/22 1415  02/17/22 1430  NA 140 142  K 3.6 3.7  CL 107 103  CO2 26  --   GLUCOSE 123* 112*  BUN 24* 23  CREATININE 0.94 0.90  CALCIUM 8.5*  --   MG 2.3  --    GFR: Estimated Creatinine Clearance: 37 mL/min (by C-G formula based on SCr of 0.9 mg/dL). Liver Function Tests: Recent Labs  Lab 02/17/22 1415  AST 22  ALT 14  ALKPHOS 57  BILITOT 0.9  PROT 6.5  ALBUMIN 3.6   Recent Labs  Lab 02/17/22 1415  LIPASE 29   No results for input(s): "AMMONIA" in the last 168 hours. Coagulation Profile: Recent Labs  Lab 02/17/22 1415  INR 1.1   Cardiac Enzymes: No results for input(s): "CKTOTAL", "CKMB", "CKMBINDEX", "TROPONINI" in the last 168 hours. BNP (last 3 results) No results for input(s): "PROBNP" in the last 8760 hours. HbA1C: No results for input(s): "HGBA1C" in the last 72 hours. CBG: No results for input(s): "GLUCAP" in the last 168 hours. Lipid Profile: No results for input(s): "CHOL", "HDL", "LDLCALC", "TRIG", "CHOLHDL", "LDLDIRECT" in the last 72 hours. Thyroid Function Tests: No results for input(s): "TSH", "T4TOTAL", "FREET4", "T3FREE", "THYROIDAB" in the last 72 hours. Anemia Panel: No results for input(s): "VITAMINB12", "FOLATE", "FERRITIN", "TIBC", "IRON", "RETICCTPCT" in the last 72 hours. Sepsis Labs: Recent Labs  Lab 02/17/22 1415 02/17/22 1705 02/17/22 2131  LATICACIDVEN 3.0* 2.3* 1.8    Recent Results (from the past 240 hour(s))  MRSA Next Gen by PCR, Nasal     Status: None   Collection Time: 02/18/22 12:54 AM   Specimen: Nasal Mucosa; Nasal Swab  Result Value Ref Range Status   MRSA by PCR Next Gen NOT DETECTED NOT DETECTED Final    Comment: (NOTE) The GeneXpert MRSA Assay (FDA approved for NASAL specimens only), is one component of a comprehensive MRSA colonization surveillance program. It is not intended to diagnose MRSA infection nor to guide or monitor treatment for MRSA infections. Test performance is not FDA approved in patients less than 110  years old. Performed at [facility], [address] [place], [place] [address]          Radiology Studies: CT Angio Abd/Pel W and/or Wo Contrast  Result Date: 02/17/2022 CLINICAL DATA:  Lower GI bleed, rectal bleeding EXAM: CTA ABDOMEN AND PELVIS WITHOUT AND WITH CONTRAST TECHNIQUE: Multidetector CT imaging of the abdomen and pelvis was performed using the standard protocol during bolus administration of intravenous contrast. Multiplanar reconstructed images and MIPs were obtained and reviewed to evaluate the vascular anatomy. RADIATION DOSE REDUCTION: This exam was performed according to the departmental dose-optimization program which includes automated exposure control, adjustment of the mA and/or kV according to patient size and/or use of iterative reconstruction technique. CONTRAST:  OMNIPAQUE IOHEXOL 350 MG/ML SOLN COMPARISON:  CT abdomen/pelvis dated 11/16/2021. FINDINGS: Lower chest: Lung bases are clear. Hepatobiliary: Liver is within normal limits. Status post cholecystectomy.  No intrahepatic or extrahepatic ductal dilatation. Pancreas: Within normal limits. Spleen: Status post splenectomy. Adrenals/Urinary Tract: Adrenal glands are within normal limits. 3.3 cm mildly proteinaceous right upper pole renal cyst, without convincing enhancement following contrast administration, benign (Bosniak II). No follow-up is recommended. Kidneys are otherwise within normal limits. No hydronephrosis. Bladder is within normal limits. Stomach/Bowel: Stomach is within normal limits. No evidence of bowel obstruction. Appendix is not discretely visualized. Extensive colonic diverticulosis, without evidence of diverticulitis. Status post left hemicolectomy with suture line in the lower pelvis (series 13/image 61). Following contrast administration, there is no active intravasation of contrast into the bowel lumen to suggest active GI bleeding. Vascular/Lymphatic: No evidence of  abdominal aortic aneurysm. Atherosclerotic calcifications of the abdominal aorta and branch vessels. No suspicious abdominopelvic lymphadenopathy. Reproductive: Status post hysterectomy. No adnexal mass. 5.1 cm simple left adnexal mass (series 13/image 56), chronic and non FDG avid on prior PET. Given the patient's age, no follow-up is recommended. Other: No abdominopelvic ascites. Musculoskeletal: Degenerative changes of the visualized thoracolumbar spine. IMPRESSION: No evidence of active GI bleeding. Status post left hemicolectomy. Extensive colonic diverticulosis, without evidence of diverticulitis. Additional ancillary findings as above. Electronically Signed   By: Charline Bills M.D.   On: 02/17/2022 20:53        Scheduled Meds:  Chlorhexidine Gluconate Cloth  6 each Topical Daily   lidocaine  1 patch Transdermal Q24H   methylPREDNISolone (SOLU-MEDROL) injection  40 mg Intravenous Once   metoCLOPramide (REGLAN) injection  10 mg Intravenous Once   pantoprazole (PROTONIX) IV  40 mg Intravenous Q12H   Continuous Infusions:  sodium chloride     sodium chloride     lactated ringers 100 mL/hr at 02/18/22 0600     LOS: 0 days    Time spent:    Erick Blinks, MD [facility]   If 7PM-7AM, please contact night-coverage [email or web address]  02/18/2022, 9:50 AM

## 2022-02-18 NOTE — Anesthesia Postprocedure Evaluation (Signed)
Anesthesia Post Note  Patient: Tamara Robertson  Procedure(s) Performed: ESOPHAGOGASTRODUODENOSCOPY (EGD) BIOPSY FLEXIBLE SIGMOIDOSCOPY     Patient location during evaluation: PACU Anesthesia Type: MAC Level of consciousness: awake and alert and oriented Pain management: pain level controlled Vital Signs Assessment: post-procedure vital signs reviewed and stable Respiratory status: spontaneous breathing, nonlabored ventilation and respiratory function stable Cardiovascular status: stable and blood pressure returned to baseline Postop Assessment: no apparent nausea or vomiting Anesthetic complications: no   No notable events documented.  Last Vitals:  Vitals:   02/18/22 1342 02/18/22 1350  BP: (!) 126/32 (!) 136/35  Pulse:  73  Resp: 12 15  Temp: (!) 36.3 C   SpO2: 100% 100%    Last Pain:  Vitals:   02/18/22 1350  TempSrc:   PainSc: 0-No pain                 Lulie Hurd A.

## 2022-02-19 ENCOUNTER — Inpatient Hospital Stay (HOSPITAL_COMMUNITY): Payer: Medicare Other | Admitting: Anesthesiology

## 2022-02-19 ENCOUNTER — Encounter (HOSPITAL_COMMUNITY)
Admission: EM | Disposition: A | Discharge status: Home / Self Care | Payer: Self-pay | Source: Home / Self Care | Attending: Internal Medicine

## 2022-02-19 ENCOUNTER — Encounter (HOSPITAL_COMMUNITY): Payer: Self-pay | Admitting: Internal Medicine

## 2022-02-19 DIAGNOSIS — I2510 Atherosclerotic heart disease of native coronary artery without angina pectoris: Secondary | ICD-10-CM | POA: Diagnosis not present

## 2022-02-19 DIAGNOSIS — K6289 Other specified diseases of anus and rectum: Secondary | ICD-10-CM | POA: Diagnosis not present

## 2022-02-19 DIAGNOSIS — K5730 Diverticulosis of large intestine without perforation or abscess without bleeding: Secondary | ICD-10-CM

## 2022-02-19 DIAGNOSIS — D62 Acute posthemorrhagic anemia: Secondary | ICD-10-CM

## 2022-02-19 DIAGNOSIS — D693 Immune thrombocytopenic purpura: Secondary | ICD-10-CM | POA: Diagnosis not present

## 2022-02-19 DIAGNOSIS — K621 Rectal polyp: Secondary | ICD-10-CM

## 2022-02-19 DIAGNOSIS — I10 Essential (primary) hypertension: Secondary | ICD-10-CM | POA: Diagnosis not present

## 2022-02-19 DIAGNOSIS — F418 Other specified anxiety disorders: Secondary | ICD-10-CM

## 2022-02-19 DIAGNOSIS — K922 Gastrointestinal hemorrhage, unspecified: Secondary | ICD-10-CM | POA: Diagnosis not present

## 2022-02-19 HISTORY — PX: HEMOSTASIS CLIP PLACEMENT: SHX6857

## 2022-02-19 HISTORY — PX: COLONOSCOPY: SHX5424

## 2022-02-19 LAB — BASIC METABOLIC PANEL
Anion gap: 8 (ref 5–15)
BUN: 14 mg/dL (ref 8–23)
CO2: 23 mmol/L (ref 22–32)
Calcium: 8.7 mg/dL — ABNORMAL LOW (ref 8.9–10.3)
Chloride: 111 mmol/L (ref 98–111)
Creatinine, Ser: 0.89 mg/dL (ref 0.44–1.00)
GFR, Estimated: >60 mL/min (ref >60)
Glucose, Bld: 95 mg/dL (ref 70–99)
Potassium: 3.4 mmol/L — ABNORMAL LOW (ref 3.5–5.1)
Sodium: 142 mmol/L (ref 135–145)

## 2022-02-19 LAB — TYPE AND SCREEN
ABO/RH(D): O POS
Antibody Screen: NEGATIVE
Unit division: 0
Unit division: 0
Unit division: 0

## 2022-02-19 LAB — CBC
HCT: 39.1 % (ref 36.0–46.0)
Hemoglobin: 13.1 g/dL (ref 12.0–15.0)
MCH: 28.9 pg (ref 26.0–34.0)
MCHC: 33.5 g/dL (ref 30.0–36.0)
MCV: 86.1 fL (ref 80.0–100.0)
Platelets: 133 10*3/uL — ABNORMAL LOW (ref 150–400)
RBC: 4.54 MIL/uL (ref 3.87–5.11)
RDW: 15 % (ref 11.5–15.5)
WBC: 12.9 10*3/uL — ABNORMAL HIGH (ref 4.0–10.5)
nRBC: 0 % (ref 0.0–0.2)

## 2022-02-19 LAB — BPAM RBC
Blood Product Expiration Date: 202311112359
Blood Product Expiration Date: 202311172359
Blood Product Expiration Date: 202311172359
ISSUE DATE / TIME: 202310141430
ISSUE DATE / TIME: 202310141742
ISSUE DATE / TIME: 202310142133
Unit Type and Rh: 5100
Unit Type and Rh: 5100
Unit Type and Rh: 9500

## 2022-02-19 SURGERY — COLONOSCOPY
Anesthesia: Monitor Anesthesia Care

## 2022-02-19 MED ORDER — PROPOFOL 500 MG/50ML IV EMUL
INTRAVENOUS | Status: DC | PRN
  Administered 2022-02-19: 150 ug/kg/min via INTRAVENOUS

## 2022-02-19 MED ORDER — LIDOCAINE 2% (20 MG/ML) 5 ML SYRINGE
INTRAMUSCULAR | Status: DC | PRN
  Administered 2022-02-19: 60 mg via INTRAVENOUS

## 2022-02-19 MED ORDER — POTASSIUM CHLORIDE CRYS ER 20 MEQ PO TBCR
40.0000 meq | EXTENDED_RELEASE_TABLET | Freq: Once | ORAL | Status: AC
  Administered 2022-02-19: 40 meq via ORAL
  Filled 2022-02-19: qty 2

## 2022-02-19 MED ORDER — METOPROLOL SUCCINATE ER 25 MG PO TB24
12.5000 mg | ORAL_TABLET | Freq: Once | ORAL | Status: AC
  Administered 2022-02-19: 12.5 mg via ORAL
  Filled 2022-02-19: qty 0.5

## 2022-02-19 MED ORDER — ESCITALOPRAM OXALATE 5 MG PO TABS
5.0000 mg | ORAL_TABLET | Freq: Every day | ORAL | Status: DC
  Administered 2022-02-19 – 2022-02-20 (×2): 5 mg via ORAL
  Filled 2022-02-19 (×2): qty 1

## 2022-02-19 MED ORDER — LACTATED RINGERS IV SOLN
INTRAVENOUS | Status: AC | PRN
  Administered 2022-02-19: 20 mL/h via INTRAVENOUS

## 2022-02-19 MED ORDER — SODIUM CHLORIDE 0.9 % IV SOLN: INTRAVENOUS | Status: DC

## 2022-02-19 NOTE — Anesthesia Procedure Notes (Signed)
Procedure Name: MAC Date/Time: 02/19/2022 2:55 PM  Performed by: Jenne Campus, CRNAPre-anesthesia Checklist: Patient identified, Emergency Drugs available, Suction available, Patient being monitored and Timeout performed Oxygen Delivery Method: Simple face mask

## 2022-02-19 NOTE — Interval H&P Note (Signed)
History and Physical Interval Note:  02/19/2022 1:27 PM  Tamara Robertson  has presented today for surgery, with the diagnosis of BRBPR.  The various methods of treatment have been discussed with the patient and family. After consideration of risks, benefits and other options for treatment, the patient has consented to  Procedure(s): COLONOSCOPY (N/A) as a surgical intervention.  The patient's history has been reviewed, patient examined, no change in status, stable for surgery.  I have reviewed the patient's chart and labs.  Questions were answered to the patient's satisfaction.    Patient seen in the endoscopy preprocedure area.  She is alert, conversational and in no distress.  Denies chest pain, dyspnea or abdominal pain. In fact, she appears quite spry today.  Hemoglobin 12.5, she was able to complete entire bowel preparation.  Proceed with colonoscopy. Procedure described in detail along with risks and benefits and she was agreeable.  The benefits and risks of the planned procedure were described in detail with the patient or (when appropriate) their health care proxy.  Risks were outlined as including, but not limited to, bleeding, infection, perforation, adverse medication reaction leading to cardiac or pulmonary decompensation, pancreatitis (if ERCP).  The limitation of incomplete mucosal visualization was also discussed.  No guarantees or warranties were given.   Nelida Meuse III

## 2022-02-19 NOTE — Op Note (Signed)
[facility] Patient Name: Tamara Robertson Procedure Date: 02/19/2022 MRN: [id number] Attending MD: Starr Lake. Myrtie Neither , MD Date of Birth: 22-Oct-1929 CSN: [id number] Age: 88 Admit Type: Inpatient Procedure:                Colonoscopy Indications:              Hematochezia, Acute post hemorrhagic anemia Providers:                Sherilyn Cooter L. Myrtie Neither, MD, Doree Albee, RN, Rozetta Nunnery, Technician Referring MD:             [facility] Medicines:                Monitored Anesthesia Care Complications:            No immediate complications. Estimated Blood Loss:     Estimated blood loss: none. Procedure:                Pre-Anesthesia Assessment:                           - Prior to the procedure, a History and Physical                            was performed, and patient medications and                            allergies were reviewed. The patient's tolerance of                            previous anesthesia was also reviewed. The risks                            and benefits of the procedure and the sedation                            options and risks were discussed with the patient.                            All questions were answered, and informed consent                            was obtained. Prior Anticoagulants: The patient has                            taken no previous anticoagulant or antiplatelet                            agents. ASA Grade Assessment: III - A patient with                            severe systemic disease. After reviewing the risks  and benefits, the patient was deemed in                            satisfactory condition to undergo the procedure.                           After obtaining informed consent, the colonoscope                            was passed under direct vision. Throughout the                            procedure, the patient's blood pressure, pulse, and                             oxygen saturations were monitored continuously. The                            [id number]-HQ190L ([id number]) Olympus colonoscope was                            introduced through the anus and advanced to the the                            cecum, identified by appendiceal orifice and                            ileocecal valve. The colonoscopy was somewhat                            difficult due to a redundant colon. Successful                            completion of the procedure was aided by using                            manual pressure and straightening and shortening                            the scope to obtain bowel loop reduction. The                            patient tolerated the procedure well. The quality                            of the bowel preparation was good in most areas,                            fair in otheres despite lavage. The ileocecal                            valve, appendiceal orifice, and rectum were  photographed. Scope In: 2:57:24 PM Scope Out: 3:15:44 PM Scope Withdrawal Time: 0 hours 10 minutes 28 seconds  Total Procedure Duration: 0 hours 18 minutes 20 seconds  Findings:      The digital rectal exam findings include decreased sphincter tone.      Many diverticula were found in the entire colon. A shallow diverticulum       in the cecum had a visible vessel with a small amount of adherent blood.       This appeared to have been the culprit lesion. For hemostasis, three       hemostatic clips were successfully placed (MR conditional). No other       diverticulae had visible bleeding stigmata. No fresh or old blood in the       colon.      A 4 mm polyp was found in the rectum. The polyp was sessile. It was not       removed because it was benign-appearing and I did not want to risk       post-polypectomy bleeding in this clinical scenario.      The exam was otherwise without abnormality. Impression:               - Decreased  sphincter tone found on digital rectal                            exam.                           - Diverticulosis in the entire examined colon.                            Clips (MR conditional) were placed.                           - One 4 mm polyp in the rectum.                           - The examination was otherwise normal.                           - No specimens collected. Moderate Sedation:      MAC sedation used Recommendation:           - Return patient to hospital ward for ongoing care.                           - Resume regular diet.                           - CBC tomorrow AM                           GI service will see patient tomorrow. Call sooner                            if there is rebleeding.                           - The rectal polyp does not warrant  repeat scope                            for polypectomy.                           - Daughter updated by phone. Procedure Code(s):        --- Professional ---                           [phone], Colonoscopy, flexible; with control of                            bleeding, any method Diagnosis Code(s):        --- Professional ---                           K62.89, Other specified diseases of anus and rectum                           K62.1, Rectal polyp                           K92.1, Melena (includes Hematochezia)                           D62, Acute posthemorrhagic anemia                           K57.30, Diverticulosis of large intestine without                            perforation or abscess without bleeding CPT copyright 2019 American [employer] Association. All rights reserved. The codes documented in this report are preliminary and upon coder review may  be revised to meet current compliance requirements. Geroge Gilliam L. Myrtie Neither, MD 02/19/2022 3:33:01 PM This report has been signed electronically. Number of Addenda: 0

## 2022-02-19 NOTE — Progress Notes (Signed)
PROGRESS NOTE    Tamara Robertson  ZOX:[id number] DOB: [birth year] DOA: 02/17/2022 PCP: Pincus Sanes, MD    Brief Narrative:  88 y/o female with history of paroxysmal atrial fibrillation, not on anticoagulation due to history of prior GI bleed history of ITP on chronic steroid therapy, nonobstructive coronary artery disease, presents to the hospital with blood per rectum.  She was transfused 3 units PRBC on admission.  Initial CT abdomen did not show any evidence of active bleeding.  GI is following and she underwent EGD/flexible sigmoidoscopy in 10/15.  It was noted that she had a large amount of blood in her colon, but improved source was identified.  She will undergo colonoscopy 10/16   Assessment & Plan:   Principal Problem:   Acute lower GI bleeding Active Problems:   Hypertension   CAD (coronary artery disease)   Leukocytosis   Chronic ITP (idiopathic thrombocytopenia) (HCC)   GI bleeding   Acute GI bleed -suspect lower GI bleeding, although CT abd done on admission did not show source of active bleed. She was noted to have significant diverticulosis. She is s/p left hemicolectomy -GI following -She underwent EGD/flexible sigmoidoscopy on 10/15 that did show an extensive amount of blood in her colon, but no clear source of bleeding was identified. -on PPI BID -She will be undergoing colonoscopy on 10/16  Acute blood loss anemia -hemoglobin of 11.1 on admission -she was transfused 3 unit prbc on admit -hemoglobin is currently stable, 13.1, -continue to monitor  Paroxysmal A fib -HR currently stable -holding metoprolol for now due to concerns for ongoing bleeding and risk of hypotension -she is not on chronic anticoagulation due to prior history of GI bleeding  CAD -stable, no complaints of chest pain  History of ITP -platelet count is currently stable -she is on prednisone 2.5mg  every other day -currently getting IV steroids for contrast allergy prophylaxis in case  she needs CTA -will restart prednisone when able   Chronic LE edema -on chronic lasix -hold for now in the setting of bleeding   DVT prophylaxis: SCDs Start: 02/17/22 2337  Code Status: DNR Family Communication: discussed with patient Disposition Plan: Status is: Inpatient The patient will require care spanning > 2 midnights and should be moved to inpatient because: continued work up and monitoring for GI bleeding     Consultants:  [facility]  Procedures:    Antimicrobials:      Subjective: Reports having several bloody bowel movements since 6:30am , no abdominal pain, no vomiting  Objective: Vitals:   02/19/22 0329 02/19/22 0739 02/19/22 0800 02/19/22 0900  BP:   (!) 163/66 (!) 178/54  Pulse:  91 87 88  Resp:  19 14 19   Temp: (!) 97.5 F (36.4 C) 98.2 F (36.8 C)    TempSrc: Oral Oral    SpO2:  97% 97% 97%  Weight:      Height:        Intake/Output Summary (Last 24 hours) at 02/19/2022 0959 Last data filed at 02/19/2022 0900 Gross per 24 hour  Intake 2495.65 ml  Output 2350 ml  Net 145.65 ml   Filed Weights   02/17/22 1346 02/18/22 1246  Weight: 65.8 kg 65.8 kg    Examination:  General exam: Appears calm and comfortable  Respiratory system: Clear to auscultation. Respiratory effort normal. Cardiovascular system: S1 & S2 heard, RRR. No JVD, murmurs, rubs, gallops or clicks. No pedal edema. Gastrointestinal system: Abdomen is nondistended, soft and nontender. No organomegaly or masses  felt. Normal bowel sounds heard. Central nervous system: Alert and oriented. No focal neurological deficits. Extremities: Symmetric 5 x 5 power. Skin: No rashes, lesions or ulcers Psychiatry: Judgement and insight appear normal. Mood & affect appropriate.     Data Reviewed: I have personally reviewed following labs and imaging studies  CBC: Recent Labs  Lab 02/17/22 1415 02/17/22 1430 02/18/22 0057 02/18/22 0238 02/18/22 0729 02/19/22 0705  WBC 11.5*  --   11.8* 11.5* 14.7* 12.9*  NEUTROABS 4.9  --   --   --   --   --   HGB 11.1* 11.6* 14.6 14.3 15.0 13.1  HCT 34.2* 34.0* 43.3 42.9 45.0 39.1  MCV 85.9  --  85.6 86.0 86.0 86.1  PLT 146*  --  143* 143* 142* 133*   Basic Metabolic Panel: Recent Labs  Lab 02/17/22 1415 02/17/22 1430 02/19/22 0705  NA 140 142 142  K 3.6 3.7 3.4*  CL 107 103 111  CO2 26  --  23  GLUCOSE 123* 112* 95  BUN 24* 23 14  CREATININE 0.94 0.90 0.89  CALCIUM 8.5*  --  8.7*  MG 2.3  --   --    GFR: Estimated Creatinine Clearance: 37.4 mL/min (by C-G formula based on SCr of 0.89 mg/dL). Liver Function Tests: Recent Labs  Lab 02/17/22 1415  AST 22  ALT 14  ALKPHOS 57  BILITOT 0.9  PROT 6.5  ALBUMIN 3.6   Recent Labs  Lab 02/17/22 1415  LIPASE 29   No results for input(s): "AMMONIA" in the last 168 hours. Coagulation Profile: Recent Labs  Lab 02/17/22 1415  INR 1.1   Cardiac Enzymes: No results for input(s): "CKTOTAL", "CKMB", "CKMBINDEX", "TROPONINI" in the last 168 hours. BNP (last 3 results) No results for input(s): "PROBNP" in the last 8760 hours. HbA1C: No results for input(s): "HGBA1C" in the last 72 hours. CBG: No results for input(s): "GLUCAP" in the last 168 hours. Lipid Profile: No results for input(s): "CHOL", "HDL", "LDLCALC", "TRIG", "CHOLHDL", "LDLDIRECT" in the last 72 hours. Thyroid Function Tests: No results for input(s): "TSH", "T4TOTAL", "FREET4", "T3FREE", "THYROIDAB" in the last 72 hours. Anemia Panel: No results for input(s): "VITAMINB12", "FOLATE", "FERRITIN", "TIBC", "IRON", "RETICCTPCT" in the last 72 hours. Sepsis Labs: Recent Labs  Lab 02/17/22 1415 02/17/22 1705 02/17/22 2131  LATICACIDVEN 3.0* 2.3* 1.8    Recent Results (from the past 240 hour(s))  MRSA Next Gen by PCR, Nasal     Status: None   Collection Time: 02/18/22 12:54 AM   Specimen: Nasal Mucosa; Nasal Swab  Result Value Ref Range Status   MRSA by PCR Next Gen NOT DETECTED NOT DETECTED Final     Comment: (NOTE) The GeneXpert MRSA Assay (FDA approved for NASAL specimens only), is one component of a comprehensive MRSA colonization surveillance program. It is not intended to diagnose MRSA infection nor to guide or monitor treatment for MRSA infections. Test performance is not FDA approved in patients less than 60 years old. Performed at [facility], [address], [place], [place] [address]          Radiology Studies: CT Angio Abd/Pel W and/or Wo Contrast  Result Date: 02/17/2022 CLINICAL DATA:  Lower GI bleed, rectal bleeding EXAM: CTA ABDOMEN AND PELVIS WITHOUT AND WITH CONTRAST TECHNIQUE: Multidetector CT imaging of the abdomen and pelvis was performed using the standard protocol during bolus administration of intravenous contrast. Multiplanar reconstructed images and MIPs were obtained and reviewed to evaluate the vascular anatomy.  RADIATION DOSE REDUCTION: This exam was performed according to the departmental dose-optimization program which includes automated exposure control, adjustment of the mA and/or kV according to patient size and/or use of iterative reconstruction technique. CONTRAST:  OMNIPAQUE IOHEXOL 350 MG/ML SOLN COMPARISON:  CT abdomen/pelvis dated 11/16/2021. FINDINGS: Lower chest: Lung bases are clear. Hepatobiliary: Liver is within normal limits. Status post cholecystectomy. No intrahepatic or extrahepatic ductal dilatation. Pancreas: Within normal limits. Spleen: Status post splenectomy. Adrenals/Urinary Tract: Adrenal glands are within normal limits. 3.3 cm mildly proteinaceous right upper pole renal cyst, without convincing enhancement following contrast administration, benign (Bosniak II). No follow-up is recommended. Kidneys are otherwise within normal limits. No hydronephrosis. Bladder is within normal limits. Stomach/Bowel: Stomach is within normal limits. No evidence of bowel obstruction. Appendix is not discretely visualized.  Extensive colonic diverticulosis, without evidence of diverticulitis. Status post left hemicolectomy with suture line in the lower pelvis (series 13/image 61). Following contrast administration, there is no active intravasation of contrast into the bowel lumen to suggest active GI bleeding. Vascular/Lymphatic: No evidence of abdominal aortic aneurysm. Atherosclerotic calcifications of the abdominal aorta and branch vessels. No suspicious abdominopelvic lymphadenopathy. Reproductive: Status post hysterectomy. No adnexal mass. 5.1 cm simple left adnexal mass (series 13/image 56), chronic and non FDG avid on prior PET. Given the patient's age, no follow-up is recommended. Other: No abdominopelvic ascites. Musculoskeletal: Degenerative changes of the visualized thoracolumbar spine. IMPRESSION: No evidence of active GI bleeding. Status post left hemicolectomy. Extensive colonic diverticulosis, without evidence of diverticulitis. Additional ancillary findings as above. Electronically Signed   By: Charline Bills M.D.   On: 02/17/2022 20:53        Scheduled Meds:  bisacodyl  10 mg Oral Once   Chlorhexidine Gluconate Cloth  6 each Topical Daily   lidocaine  2 patch Transdermal Q24H   pantoprazole (PROTONIX) IV  40 mg Intravenous Q12H   Continuous Infusions:  sodium chloride     sodium chloride     lactated ringers 100 mL/hr at 02/19/22 0900     LOS: 1 day    Time spent:    Erick Blinks, MD [facility]   If 7PM-7AM, please contact night-coverage [email or web address]  02/19/2022, 9:59 AM

## 2022-02-19 NOTE — Anesthesia Postprocedure Evaluation (Signed)
Anesthesia Post Note  Patient: Tamara Robertson  Procedure(s) Performed: COLONOSCOPY HEMOSTASIS CLIP PLACEMENT     Patient location during evaluation: Endoscopy Anesthesia Type: MAC Level of consciousness: awake and alert, patient cooperative and oriented Pain management: pain level controlled Vital Signs Assessment: post-procedure vital signs reviewed and stable Respiratory status: nonlabored ventilation, spontaneous breathing and respiratory function stable Cardiovascular status: stable and blood pressure returned to baseline Postop Assessment: no apparent nausea or vomiting and able to ambulate Anesthetic complications: no   No notable events documented.  Last Vitals:  Vitals:   02/19/22 1530 02/19/22 1540  BP: (!) 124/91 (!) 163/68  Pulse: 99 93  Resp: 19 16  Temp:    SpO2: 96% 97%    Last Pain:  Vitals:   02/19/22 1524  TempSrc:   PainSc: 0-No pain                 Charron Coultas,E. Oriyah Lamphear

## 2022-02-19 NOTE — Transfer of Care (Signed)
Immediate Anesthesia Transfer of Care Note  Patient: Tamara Robertson  Procedure(s) Performed: COLONOSCOPY HEMOSTASIS CLIP PLACEMENT  Patient Location: Endoscopy Unit  Anesthesia Type:MAC  Level of Consciousness: awake  Airway & Oxygen Therapy: Patient Spontanous Breathing and Patient connected to face mask oxygen  Post-op Assessment: Report given to RN and Post -op Vital signs reviewed and stable  Post vital signs: Reviewed and stable  Last Vitals:  Vitals Value Taken Time  BP    Temp    Pulse 93 02/19/22 1524  Resp 14 02/19/22 1524  SpO2 100 % 02/19/22 1524  Vitals shown include unvalidated device data.  Last Pain:  Vitals:   02/19/22 1327  TempSrc:   PainSc: 4          Complications: No notable events documented.

## 2022-02-19 NOTE — Anesthesia Preprocedure Evaluation (Addendum)
Anesthesia Evaluation  Patient identified by MRN, date of birth, ID band Patient awake    Reviewed: Allergy & Precautions, NPO status , Patient's Chart, lab work & pertinent test results, reviewed documented beta blocker date and time   History of Anesthesia Complications Negative for: history of anesthetic complications  Airway Mallampati: II  TM Distance: >3 FB Neck ROM: Full    Dental  (+) Dental Advisory Given   Pulmonary neg pulmonary ROS,    breath sounds clear to auscultation       Cardiovascular hypertension, Pt. on medications and Pt. on home beta blockers (-) angina+ CAD (non-obstructive)  + Valvular Problems/Murmurs AI and MR  Rhythm:Regular Rate:Normal  '22 ECHO: EF 60 to 65%. The LV has normal function, no regional wall motion abnormalities. Grade II DD. There is basal septal hypertrophy noted.  2. Right ventricular systolic function is normal. The right ventricular size is normal. There is normal pulmonary artery systolic pressure.  3. The MV is grossly normal. Mild to moderate MR. No evidence of mitral stenosis.  4. The aortic valve was not well visualized. AI mild to moderate. No AS   Neuro/Psych  Headaches, Anxiety Depression glaucoma    GI/Hepatic Neg liver ROS, GERD  Medicated and Controlled,  Endo/Other  negative endocrine ROS  Renal/GU negative Renal ROS     Musculoskeletal   Abdominal   Peds  Hematology  (+) Blood dyscrasia (ITP), , Hb 13.1, plt 133k   Anesthesia Other Findings   Reproductive/Obstetrics                            Anesthesia Physical Anesthesia Plan  ASA: 3  Anesthesia Plan: MAC   Post-op Pain Management: Minimal or no pain anticipated   Induction:   PONV Risk Score and Plan: 2 and Ondansetron and Treatment may vary due to age or medical condition  Airway Management Planned: Simple Face Mask and Natural Airway  Additional Equipment:  None  Intra-op Plan:   Post-operative Plan:   Informed Consent: I have reviewed the patients History and Physical, chart, labs and discussed the procedure including the risks, benefits and alternatives for the proposed anesthesia with the patient or authorized representative who has indicated his/her understanding and acceptance.   Patient has DNR.  Discussed DNR with patient and Suspend DNR.   Dental advisory given  Plan Discussed with: CRNA and Surgeon  Anesthesia Plan Comments:        Anesthesia Quick Evaluation

## 2022-02-20 ENCOUNTER — Encounter (HOSPITAL_COMMUNITY): Payer: Self-pay | Admitting: Gastroenterology

## 2022-02-20 DIAGNOSIS — K625 Hemorrhage of anus and rectum: Secondary | ICD-10-CM

## 2022-02-20 DIAGNOSIS — D62 Acute posthemorrhagic anemia: Secondary | ICD-10-CM | POA: Diagnosis not present

## 2022-02-20 DIAGNOSIS — K5731 Diverticulosis of large intestine without perforation or abscess with bleeding: Secondary | ICD-10-CM | POA: Diagnosis not present

## 2022-02-20 DIAGNOSIS — R578 Other shock: Secondary | ICD-10-CM | POA: Diagnosis not present

## 2022-02-20 DIAGNOSIS — K922 Gastrointestinal hemorrhage, unspecified: Secondary | ICD-10-CM | POA: Diagnosis not present

## 2022-02-20 LAB — BASIC METABOLIC PANEL
Anion gap: 6 (ref 5–15)
BUN: 15 mg/dL (ref 8–23)
CO2: 24 mmol/L (ref 22–32)
Calcium: 8.6 mg/dL — ABNORMAL LOW (ref 8.9–10.3)
Chloride: 110 mmol/L (ref 98–111)
Creatinine, Ser: 0.74 mg/dL (ref 0.44–1.00)
GFR, Estimated: >60 mL/min (ref >60)
Glucose, Bld: 112 mg/dL — ABNORMAL HIGH (ref 70–99)
Potassium: 4 mmol/L (ref 3.5–5.1)
Sodium: 140 mmol/L (ref 135–145)

## 2022-02-20 LAB — CBC
HCT: 32 % — ABNORMAL LOW (ref 36.0–46.0)
HCT: 33.9 % — ABNORMAL LOW (ref 36.0–46.0)
Hemoglobin: 10.6 g/dL — ABNORMAL LOW (ref 12.0–15.0)
Hemoglobin: 11.2 g/dL — ABNORMAL LOW (ref 12.0–15.0)
MCH: 29 pg (ref 26.0–34.0)
MCH: 29.3 pg (ref 26.0–34.0)
MCHC: 33.1 g/dL (ref 30.0–36.0)
MCHC: 33 g/dL (ref 30.0–36.0)
MCV: 87.7 fL (ref 80.0–100.0)
MCV: 88.7 fL (ref 80.0–100.0)
Platelets: 138 10*3/uL — ABNORMAL LOW (ref 150–400)
Platelets: 154 10*3/uL (ref 150–400)
RBC: 3.65 MIL/uL — ABNORMAL LOW (ref 3.87–5.11)
RBC: 3.82 MIL/uL — ABNORMAL LOW (ref 3.87–5.11)
RDW: 15.1 % (ref 11.5–15.5)
RDW: 15.2 % (ref 11.5–15.5)
WBC: 12.5 10*3/uL — ABNORMAL HIGH (ref 4.0–10.5)
WBC: 13.1 10*3/uL — ABNORMAL HIGH (ref 4.0–10.5)
nRBC: 0 % (ref 0.0–0.2)
nRBC: 0 % (ref 0.0–0.2)

## 2022-02-20 MED ORDER — FUROSEMIDE 20 MG PO TABS
30.0000 mg | ORAL_TABLET | Freq: Every day | ORAL | Status: DC
  Administered 2022-02-20: 30 mg via ORAL
  Filled 2022-02-20: qty 2

## 2022-02-20 MED ORDER — PREDNISONE 5 MG PO TABS
2.5000 mg | ORAL_TABLET | ORAL | Status: DC
  Administered 2022-02-20: 2.5 mg via ORAL
  Filled 2022-02-20: qty 1

## 2022-02-20 MED ORDER — METOPROLOL SUCCINATE ER 25 MG PO TB24
12.5000 mg | ORAL_TABLET | Freq: Every day | ORAL | Status: DC
  Administered 2022-02-20: 12.5 mg via ORAL
  Filled 2022-02-20: qty 1

## 2022-02-20 NOTE — Discharge Summary (Signed)
Physician Discharge Summary  Tamara Robertson ZOX:[id number] DOB: [birth year] DOA: 02/17/2022  PCP: Pincus Sanes, MD  Admit date: 02/17/2022 Discharge date: 02/20/2022  Admitted From: home Disposition:  home  Recommendations for Outpatient Follow-up:  Follow up with PCP in 1-2 weeks Please obtain BMP/CBC in one week Follow up with Dr. Rhea Belton as scheduled  Discharge Condition:stable CODE STATUS: DNR Diet recommendation: heart healthy  Brief/Interim Summary: 88 y/o female with history of paroxysmal atrial fibrillation, not on anticoagulation due to history of prior GI bleed history of ITP on chronic steroid therapy, nonobstructive coronary artery disease, presents to the hospital with blood per rectum.  She was transfused 3 units PRBC on admission.  Initial CT abdomen did not show any evidence of active bleeding.  GI is following and she underwent EGD/flexible sigmoidoscopy in 10/15.  It was noted that she had a large amount of blood in her colon, but improved source was identified.  She had colonoscopy on 10/16 that did show a cecal diverticuli with visible vessel that may have been the culprit lesion.  This was clipped.  She did not have any recurrence of bleeding.  She is felt stable for discharge home.  Discharge Diagnoses:  Principal Problem:   Acute lower GI bleeding Active Problems:   Hypertension   CAD (coronary artery disease)   Leukocytosis   Chronic ITP (idiopathic thrombocytopenia) (HCC)   Hematochezia   GI bleeding   Acute blood loss anemia   Diverticulosis of colon with hemorrhage  Acute GI bleed -suspect lower GI bleeding, although CT abd done on admission did not show source of active bleed. She was noted to have significant diverticulosis. She is s/p left hemicolectomy -GI following -She underwent EGD/flexible sigmoidoscopy on 10/15 that did show an extensive amount of blood in her colon, but no clear source of bleeding was identified. -Colonoscopy was performed on  10/16 that did show cecal diverticuli with visible vessel.  This lesion was clipped and was likely the culprit lesion -She did not have any further bleeding   Acute blood loss anemia -hemoglobin of 11.1 on admission -she was transfused 3 unit prbc on admit -With ongoing blood loss, her hemoglobin did trend down to 10.6 during her hospital stay, but has since stabilized at 11.2   Paroxysmal A fib -HR currently stable -Resuming metoprolol on discharge -she is not on chronic anticoagulation due to prior history of GI bleeding   CAD -stable, no complaints of chest pain   History of ITP -platelet count is currently stable -she is on prednisone 2.5mg  every other day    Chronic LE edema -on chronic lasix  Discharge Instructions  Discharge Instructions     Diet - low sodium heart healthy   Complete by: As directed    Increase activity slowly   Complete by: As directed       Allergies as of 02/20/2022       Reactions   Aspirin Other (See Comments)   ITP   Sulfa Antibiotics Other (See Comments)   dizziness   Contrast Media [iodinated Contrast Media] Hives   CAT scan contrast only   Shellfish-derived Products Hives   Other reaction(s): Other   Eliquis [apixaban]    Rash    Fluoxetine    Did not feel well on it   Sulfamethoxazole-trimethoprim    Other reaction(s): Unknown   Celebrex [celecoxib] Other (See Comments)   Leg swelling   Celexa [citalopram] Other (See Comments)   Felt out of it  Statins Other (See Comments)        Medication List     TAKE these medications    acetaminophen 500 MG tablet Commonly known as: TYLENOL Take 500 mg by mouth every 6 (six) hours as needed for moderate pain.   Biotin 2500 MCG Caps Take 1 capsule by mouth daily.   calcium carbonate 500 MG chewable tablet Commonly known as: TUMS - dosed in mg elemental calcium Chew 1 tablet by mouth daily as needed for indigestion or heartburn.   cholecalciferol 25 MCG (1000 UNIT)  tablet Commonly known as: VITAMIN D3 Take 1,000 Units by mouth daily.   escitalopram 5 MG tablet Commonly known as: LEXAPRO Take 1 tablet (5 mg total) by mouth daily.   famotidine 20 MG tablet Commonly known as: Pepcid Take 1 tablet (20 mg total) by mouth 2 (two) times daily as needed for heartburn or indigestion.   Flaxseed Misc Take 5 mLs by mouth daily. Take 1 tsp daily   furosemide 20 MG tablet Commonly known as: LASIX Take 1.5 tablets (30 mg total) by mouth daily.   lidocaine 4 % cream Commonly known as: LMX Apply 1 application  topically daily as needed (inflammation).   lidocaine 4 % Place 1 patch onto the skin daily.   Magnesium Oxide 250 MG Tabs Take 1 tablet by mouth.   metoprolol succinate 25 MG 24 hr tablet Commonly known as: TOPROL-XL TAKE 1/2 (ONE-HALF) TABLET BY MOUTH ONCE DAILY WITH OR IMMEDIATELY FOLLOWING A MEAL   MOVE FREE JOINT HEALTH ADVANCE PO Take 1 tablet by mouth daily.   multivitamin tablet Take 1 tablet by mouth daily.   polyethylene glycol 17 g packet Commonly known as: MIRALAX / GLYCOLAX Take 17 g by mouth daily as needed for moderate constipation.   predniSONE 2.5 MG tablet Commonly known as: DELTASONE Take 1 tablet (2.5 mg total) by mouth every other day.   PROBIOTIC DAILY PO Take 1 tablet by mouth daily with breakfast.   PSYLLIUM PO Take 1 tablet by mouth daily as needed (constipation).   triamcinolone cream 0.1 % Commonly known as: KENALOG Apply 1 Application topically 2 (two) times daily as needed (rash).   trolamine salicylate 10 % cream Commonly known as: ASPERCREME Apply 1 application topically as needed for muscle pain.        Follow-up Information     Pyrtle, Carie Caddy, MD Follow up.   Specialty: Gastroenterology Why: follow up as scheduled Contact information: [address] [place] [address] [phone]                Allergies  Allergen Reactions   Aspirin Other (See Comments)    ITP    Sulfa Antibiotics Other (See Comments)    dizziness   Contrast Media [Iodinated Contrast Media] Hives    CAT scan contrast only   Shellfish-Derived Products Hives    Other reaction(s): Other   Eliquis [Apixaban]     Rash    Fluoxetine     Did not feel well on it    Sulfamethoxazole-Trimethoprim     Other reaction(s): Unknown   Celebrex [Celecoxib] Other (See Comments)    Leg swelling   Celexa [Citalopram] Other (See Comments)    Felt out of it   Statins Other (See Comments)    Consultations: [facility]   Procedures/Studies: CT Angio Abd/Pel W and/or Wo Contrast  Result Date: 02/17/2022 CLINICAL DATA:  Lower GI bleed, rectal bleeding EXAM: CTA ABDOMEN AND PELVIS WITHOUT AND WITH  CONTRAST TECHNIQUE: Multidetector CT imaging of the abdomen and pelvis was performed using the standard protocol during bolus administration of intravenous contrast. Multiplanar reconstructed images and MIPs were obtained and reviewed to evaluate the vascular anatomy. RADIATION DOSE REDUCTION: This exam was performed according to the departmental dose-optimization program which includes automated exposure control, adjustment of the mA and/or kV according to patient size and/or use of iterative reconstruction technique. CONTRAST:  OMNIPAQUE IOHEXOL 350 MG/ML SOLN COMPARISON:  CT abdomen/pelvis dated 11/16/2021. FINDINGS: Lower chest: Lung bases are clear. Hepatobiliary: Liver is within normal limits. Status post cholecystectomy. No intrahepatic or extrahepatic ductal dilatation. Pancreas: Within normal limits. Spleen: Status post splenectomy. Adrenals/Urinary Tract: Adrenal glands are within normal limits. 3.3 cm mildly proteinaceous right upper pole renal cyst, without convincing enhancement following contrast administration, benign (Bosniak II). No follow-up is recommended. Kidneys are otherwise within normal limits. No hydronephrosis. Bladder is within normal limits. Stomach/Bowel: Stomach is within normal  limits. No evidence of bowel obstruction. Appendix is not discretely visualized. Extensive colonic diverticulosis, without evidence of diverticulitis. Status post left hemicolectomy with suture line in the lower pelvis (series 13/image 61). Following contrast administration, there is no active intravasation of contrast into the bowel lumen to suggest active GI bleeding. Vascular/Lymphatic: No evidence of abdominal aortic aneurysm. Atherosclerotic calcifications of the abdominal aorta and branch vessels. No suspicious abdominopelvic lymphadenopathy. Reproductive: Status post hysterectomy. No adnexal mass. 5.1 cm simple left adnexal mass (series 13/image 56), chronic and non FDG avid on prior PET. Given the patient's age, no follow-up is recommended. Other: No abdominopelvic ascites. Musculoskeletal: Degenerative changes of the visualized thoracolumbar spine. IMPRESSION: No evidence of active GI bleeding. Status post left hemicolectomy. Extensive colonic diverticulosis, without evidence of diverticulitis. Additional ancillary findings as above. Electronically Signed   By: Charline Bills M.D.   On: 02/17/2022 20:53   CT Super D Chest Wo Contrast  Result Date: 01/24/2022 CLINICAL DATA:  Intermittent cough, improves with Pepcid. Probable lung cancer. * Tracking Code: BO * EXAM: CT CHEST WITHOUT CONTRAST TECHNIQUE: Multidetector CT imaging of the chest was performed using thin slice collimation for electromagnetic bronchoscopy planning purposes, without intravenous contrast. RADIATION DOSE REDUCTION: This exam was performed according to the departmental dose-optimization program which includes automated exposure control, adjustment of the mA and/or kV according to patient size and/or use of iterative reconstruction technique. COMPARISON:  PET 08/01/2021, CT chest 06/15/2021, 03/07/2021. FINDINGS: Cardiovascular: Atherosclerotic calcification of the aorta, aortic valve and coronary arteries. Heart size normal. No  pericardial effusion. Mediastinum/Nodes: No pathologically enlarged mediastinal or axillary lymph nodes. Hilar regions are difficult to definitively evaluate without IV contrast. Esophagus is grossly unremarkable. Lungs/Pleura: Biapical pleuroparenchymal scarring. Spiculated anterior segment right upper lobe nodule is now more contracted and solid in appearance, measuring 0.8 x 1.5 cm (5/44), previously 1.6 x 2.1 cm on 06/15/2021. Nodular subpleural consolidation in the apical segment right upper lobe measures 1.3 x 1.4 cm (5/26), with adjacent ground-glass, 5 mm nodule in the medial aspect of the superior segment right lower lobe (5/56), stable. Irregular nodule in the anteromedial right lower lobe measures 1.1 x 1.3 cm (5/93), similar. Scattered mucoid impaction. Rounded mixed consolidation and ground-glass with associated bronchiectasis in the left lower lobe, unchanged from 06/15/2021, measuring roughly 2.3 x 3.1 cm (5/61). Amorphous focal ground-glass in the left upper lobe (5/54), stable. No pleural fluid. Airway is unremarkable. Upper Abdomen: Liver is decreased in attenuation. Cholecystectomy. Visualized portions of the adrenal glands, left kidney, pancreas, stomach and bowel are grossly unremarkable.  Spleen is absent. Musculoskeletal: Degenerative changes in the spine. Flowing anterior osteophytosis in the thoracic spine. No worrisome lytic or sclerotic lesions. IMPRESSION: 1. Spiculated nodule in the anterior segment right upper lobe appears somewhat more contracted and solid in appearance, worrisome for indolent adenocarcinoma. 2. Multiple additional pulmonary nodules, stable. Continued annual follow-up is recommended as adenocarcinoma is not excluded. 3. Probable postinfectious scarring in the superior segment left lower lobe. 4. Hepatic steatosis. 5. Aortic atherosclerosis (ICD10-I70.0). Coronary artery calcification. Electronically Signed   By: Leanna Battles M.D.   On: 01/24/2022 14:05       Subjective: Patient is feeling well, no blood per rectum, wants to go home  Discharge Exam: Vitals:   02/20/22 1000 02/20/22 1100 02/20/22 1200 02/20/22 1520  BP: (!) 168/51 (!) 181/116 (!) 171/52 (!) 154/53  Pulse: 80 89 77 80  Resp: 15 (!) 23 13 (!) 7  Temp:   97.6 F (36.4 C)   TempSrc:   Oral   SpO2: 92% 90% 95% 96%  Weight:      Height:        General: Pt is alert, awake, not in acute distress Cardiovascular: RRR, S1/S2 +, no rubs, no gallops Respiratory: CTA bilaterally, no wheezing, no rhonchi Abdominal: Soft, NT, ND, bowel sounds + Extremities: no edema, no cyanosis    The results of significant diagnostics from this hospitalization (including imaging, microbiology, ancillary and laboratory) are listed below for reference.     Microbiology: Recent Results (from the past 240 hour(s))  MRSA Next Gen by PCR, Nasal     Status: None   Collection Time: 02/18/22 12:54 AM   Specimen: Nasal Mucosa; Nasal Swab  Result Value Ref Range Status   MRSA by PCR Next Gen NOT DETECTED NOT DETECTED Final    Comment: (NOTE) The GeneXpert MRSA Assay (FDA approved for NASAL specimens only), is one component of a comprehensive MRSA colonization surveillance program. It is not intended to diagnose MRSA infection nor to guide or monitor treatment for MRSA infections. Test performance is not FDA approved in patients less than 35 years old. Performed at [facility], [address] [place], [place] [address]      Labs: BNP (last 3 results) No results for input(s): "BNP" in the last 8760 hours. Basic Metabolic Panel: Recent Labs  Lab 02/17/22 1415 02/17/22 1430 02/19/22 0705 02/20/22 0307  NA 140 142 142 140  K 3.6 3.7 3.4* 4.0  CL 107 103 111 110  CO2 26  --  23 24  GLUCOSE 123* 112* 95 112*  BUN 24* 23 14 15   CREATININE 0.94 0.90 0.89 0.74  CALCIUM 8.5*  --  8.7* 8.6*  MG 2.3  --   --   --    Liver Function Tests: Recent Labs  Lab  02/17/22 1415  AST 22  ALT 14  ALKPHOS 57  BILITOT 0.9  PROT 6.5  ALBUMIN 3.6   Recent Labs  Lab 02/17/22 1415  LIPASE 29   No results for input(s): "AMMONIA" in the last 168 hours. CBC: Recent Labs  Lab 02/17/22 1415 02/17/22 1430 02/18/22 0238 02/18/22 0729 02/19/22 0705 02/20/22 0307 02/20/22 1228  WBC 11.5*   < > 11.5* 14.7* 12.9* 12.5* 13.1*  NEUTROABS 4.9  --   --   --   --   --   --   HGB 11.1*   < > 14.3 15.0 13.1 10.6* 11.2*  HCT 34.2*   < > 42.9 45.0 39.1 32.0* 33.9*  MCV 85.9   < > 86.0 86.0 86.1 87.7 88.7  PLT 146*   < > 143* 142* 133* 138* 154   < > = values in this interval not displayed.   Cardiac Enzymes: No results for input(s): "CKTOTAL", "CKMB", "CKMBINDEX", "TROPONINI" in the last 168 hours. BNP: Invalid input(s): "POCBNP" CBG: No results for input(s): "GLUCAP" in the last 168 hours. D-Dimer No results for input(s): "DDIMER" in the last 72 hours. Hgb A1c No results for input(s): "HGBA1C" in the last 72 hours. Lipid Profile No results for input(s): "CHOL", "HDL", "LDLCALC", "TRIG", "CHOLHDL", "LDLDIRECT" in the last 72 hours. Thyroid function studies No results for input(s): "TSH", "T4TOTAL", "T3FREE", "THYROIDAB" in the last 72 hours.  Invalid input(s): "FREET3" Anemia work up No results for input(s): "VITAMINB12", "FOLATE", "FERRITIN", "TIBC", "IRON", "RETICCTPCT" in the last 72 hours. Urinalysis    Component Value Date/Time   COLORURINE STRAW (A) 02/17/2022 2119   APPEARANCEUR CLEAR 02/17/2022 2119   LABSPEC 1.013 02/17/2022 2119   PHURINE 7.0 02/17/2022 2119   GLUCOSEU NEGATIVE 02/17/2022 2119   GLUCOSEU NEGATIVE 12/14/2020 1616   HGBUR LARGE (A) 02/17/2022 2119   BILIRUBINUR NEGATIVE 02/17/2022 2119   KETONESUR NEGATIVE 02/17/2022 2119   PROTEINUR NEGATIVE 02/17/2022 2119   UROBILINOGEN 0.2 12/14/2020 1616   NITRITE NEGATIVE 02/17/2022 2119   LEUKOCYTESUR TRACE (A) 02/17/2022 2119   Sepsis Labs Recent Labs  Lab  02/18/22 0729 02/19/22 0705 02/20/22 0307 02/20/22 1228  WBC 14.7* 12.9* 12.5* 13.1*   Microbiology Recent Results (from the past 240 hour(s))  MRSA Next Gen by PCR, Nasal     Status: None   Collection Time: 02/18/22 12:54 AM   Specimen: Nasal Mucosa; Nasal Swab  Result Value Ref Range Status   MRSA by PCR Next Gen NOT DETECTED NOT DETECTED Final    Comment: (NOTE) The GeneXpert MRSA Assay (FDA approved for NASAL specimens only), is one component of a comprehensive MRSA colonization surveillance program. It is not intended to diagnose MRSA infection nor to guide or monitor treatment for MRSA infections. Test performance is not FDA approved in patients less than 48 years old. Performed at [facility], [address], [place], [place] [address]      Time coordinating discharge:  SIGNED:   Erick Blinks, MD  [facility] 02/20/2022, 4:00 PM   If 7PM-7AM, please contact night-coverage [email or web address]

## 2022-02-20 NOTE — Final Progress Note (Signed)
Transition of Care [facility]) Screening Note   Patient Details  Name: Tamara Robertson Date of Birth: 14-Sep-1929   Transition of Care [facility]) CM/SW Contact:    Lanier Clam, RN Phone Number: 02/20/2022, 3:32 PM    Transition of Care Department [facility]) has reviewed patient and no TOC needs have been identified at this time. We will continue to monitor patient advancement through interdisciplinary progression rounds. If new patient transition needs arise, please place a TOC consult.

## 2022-02-20 NOTE — Evaluation (Signed)
Physical Therapy One Time Evaluation Patient Details Name: Tamara Robertson MRN: [id number] DOB: [birth year] Today's Date: 02/20/2022  History of Present Illness  88 year old female with history of CAD, ITP, hypertension, hyperlipidemia, anxiety, glaucoma, arthritis, status post splenectomy, status post cholecystectomy, prior SBO, status post left hemicolectomy for diverticular stricture, pandiverticulosis, GERD, IBS-C and admitted 02/17/22 with bright red blood per rectum and acute blood loss anemia  Clinical Impression  Patient evaluated by Physical Therapy with no further acute PT needs identified. All education has been completed and the patient has no further questions.  Pt mobilizing very well and anticipates d/c home later today. See below for any follow-up Physical Therapy or equipment needs. PT is signing off. Thank you for this referral.        Recommendations for follow up therapy are one component of a multi-disciplinary discharge planning process, led by the attending physician.  Recommendations may be updated based on patient status, additional functional criteria and insurance authorization.  Follow Up Recommendations No PT follow up      Assistance Recommended at Discharge None  Patient can return home with the following       Equipment Recommendations None recommended by PT  Recommendations for Other Services       Functional Status Assessment Patient has not had a recent decline in their functional status     Precautions / Restrictions Precautions Precautions: None      Mobility  Bed Mobility               General bed mobility comments: pt in recliner    Transfers Overall transfer level: Needs assistance Equipment used: None Transfers: Sit to/from Stand Sit to Stand: Modified independent (Device/Increase time), Supervision                Ambulation/Gait Ambulation/Gait assistance: Supervision, Modified independent (Device/Increase time) Gait  Distance (Feet): 600 Feet Assistive device: None Gait Pattern/deviations: WFL(Within Functional Limits)       General Gait Details: denies any symptoms, HR 106 bpm and SPO2 96% on room air  Stairs            Wheelchair Mobility    Modified Rankin (Stroke Patients Only)       Balance Overall balance assessment: No apparent balance deficits (not formally assessed)                                           Pertinent Vitals/Pain Pain Assessment Pain Assessment: No/denies pain    Home Living Family/patient expects to be discharged to:: Private residence Living Arrangements: Alone Available Help at Discharge: Family;Friend(s);Available PRN/intermittently Type of Home: House Home Access: Level entry       Home Layout: One level Home Equipment: Cane - single Librarian, academic (2 wheels)      Prior Function Prior Level of Function : Independent/Modified Independent                     Hand Dominance        Extremity/Trunk Assessment   Upper Extremity Assessment Upper Extremity Assessment: Overall WFL for tasks assessed    Lower Extremity Assessment Lower Extremity Assessment: Overall WFL for tasks assessed    Cervical / Trunk Assessment Cervical / Trunk Assessment: Normal  Communication   Communication: No difficulties  Cognition Arousal/Alertness: Awake/alert Behavior During Therapy: WFL for tasks assessed/performed Overall Cognitive Status: Within Functional Limits for  tasks assessed                                          General Comments      Exercises     Assessment/Plan    PT Assessment Patient does not need any further PT services  PT Problem List         PT Treatment Interventions      PT Goals (Current goals can be found in the Care Plan section)  Acute Rehab PT Goals PT Goal Formulation: All assessment and education complete, DC therapy    Frequency       Co-evaluation                AM-PAC PT "6 Clicks" Mobility  Outcome Measure Help needed turning from your back to your side while in a flat bed without using bedrails?: None Help needed moving from lying on your back to sitting on the side of a flat bed without using bedrails?: None Help needed moving to and from a bed to a chair (including a wheelchair)?: None Help needed standing up from a chair using your arms (e.g., wheelchair or bedside chair)?: None Help needed to walk in hospital room?: None Help needed climbing 3-5 steps with a railing? : A Little 6 Click Score: 23    End of Session   Activity Tolerance: Patient tolerated treatment well Patient left: in chair;with call bell/phone within reach Nurse Communication: Mobility status PT Visit Diagnosis: Difficulty in walking, not elsewhere classified (R26.2)    Time: 4098-1191 PT Time Calculation (min) (ACUTE ONLY): 19 min   Charges:   PT Evaluation $PT Eval Low Complexity: 1 Low         Kati PT, DPT Physical Therapist Acute Rehabilitation Services Preferred contact method: Secure Chat Weekend Pager Only: [phone] Office: [phone]   Tamara Robertson 02/20/2022, 12:05 PM

## 2022-02-20 NOTE — Progress Notes (Addendum)
Progress Note  Primary GI: Dr. Hilarie Fredrickson   Subjective  Chief Complaint:Bright red blood per rectum and acute blood loss anemia  Patient sitting in her chair states she feels very good.  She was able to eat food last night without any nausea, vomiting or abdominal pain. Patient's not had a bowel movement since the colon prep.    Objective   Vital signs in last 24 hours: Temp:  [97.5 F (36.4 C)-98.3 F (36.8 C)] 97.5 F (36.4 C) (10/17 0800) Pulse Rate:  [76-104] 91 (10/17 0800) Resp:  [12-20] 16 (10/17 0800) BP: (118-181)/(39-103) 172/103 (10/17 0800) SpO2:  [87 %-100 %] 91 % (10/17 0800) Last BM Date : 02/19/22 Last BM recorded by nurses in past 5 days Stool Type: Type 7 (Liquid consistency with no solid pieces) (02/19/2022 12:00 PM)  General:   female appears younger than stated age, in no acute distress  Heart:  Regular rate and rhythm; no murmurs Pulm: Clear anteriorly; no wheezing Abdomen:  Soft, Obese AB, Active bowel sounds. No tenderness , No organomegaly appreciated. Extremities:  without  edema. Neurologic:  Alert and  oriented x4;  No focal deficits.  Psych:  Cooperative. Normal mood and affect.  Intake/Output from previous day: 10/16 0701 - 10/17 0700 In: 882 [I.V.:882] Out: -  Intake/Output this shift: No intake/output data recorded.  Studies/Results: No results found.  Lab Results: Recent Labs    02/18/22 0729 02/19/22 0705 02/20/22 0307  WBC 14.7* 12.9* 12.5*  HGB 15.0 13.1 10.6*  HCT 45.0 39.1 32.0*  PLT 142* 133* 138*   BMET Recent Labs    02/17/22 1415 02/17/22 1430 02/19/22 0705 02/20/22 0307  NA 140 142 142 140  K 3.6 3.7 3.4* 4.0  CL 107 103 111 110  CO2 26  --  23 24  GLUCOSE 123* 112* 95 112*  BUN 24* '23 14 15  '$ CREATININE 0.94 0.90 0.89 0.74  CALCIUM 8.5*  --  8.7* 8.6*   LFT Recent Labs    02/17/22 1415  PROT 6.5  ALBUMIN 3.6  AST 22  ALT 14  ALKPHOS 57  BILITOT 0.9   PT/INR Recent Labs    02/17/22 1415   LABPROT 14.3  INR 1.1     Scheduled Meds:  bisacodyl  10 mg Oral Once   Chlorhexidine Gluconate Cloth  6 each Topical Daily   escitalopram  5 mg Oral Daily   lidocaine  2 patch Transdermal Q24H   pantoprazole (PROTONIX) IV  40 mg Intravenous Q12H   Continuous Infusions:  sodium chloride     sodium chloride        Patient profile:   88 year old female with history of CAD, ITP, hypertension, hyperlipidemia, anxiety, glaucoma, arthritis, status post splenectomy, status post cholecystectomy, prior SBO, status post left hemicolectomy for diverticular stricture, pandiverticulosis, GERD, IBS-C presents the hospital with bright red Blood Per Rectum and acute blood loss anemia.   Impression/Plan:   BRBPR with acute blood loss anemia CTA negative 10/15 EGD/flex sig without source of bleeding 10/16 Colonoscopy with Dr. Loletha Carrow probable source with visible vessel and a shallow cecal diverticulum, clipped Had drop in HGB today from 13.1 to 10.6- no BM-I am hoping this is equilibrium, old blood. Can plan for recheck hemoglobin around noon, if patient remains stable can consider discharge today versus monitoring overnight and discharge tomorrow. Has OV with Dr. Hilarie Fredrickson 11/08 for anemia, can keep this OV.  Paroxysmal atrial fibrillation Not on chronic anticoagulation due to previous GI bleeding  Principal Problem:   Acute lower GI bleeding Active Problems:   Hypertension   CAD (coronary artery disease)   Leukocytosis   Chronic ITP (idiopathic thrombocytopenia) (HCC)   Hematochezia   GI bleeding   Acute blood loss anemia    LOS: 2 days   Vladimir Crofts  02/20/2022, 9:07 AM    Attending physician's note   I have taken a history, reviewed the chart, and examined the patient. I performed a substantive portion of this encounter, including complete performance of at least one of the key components, in conjunction with the APP. I agree with the APP's note, impression, and  recommendations with my edits.   She is feeling well, eating her lunch without any issues.  No overt bleeding.  H/H stable/uptrending.  She is hoping to discharge home later today.  Inpatient GI service will sign off at this time.  She has follow-up appointment with Dr. Hilarie Fredrickson already scheduled.  Can repeat CBC at that time to ensure return to baseline.  [address], DO, FACG [phone] office

## 2022-02-21 ENCOUNTER — Telehealth: Payer: Self-pay

## 2022-02-21 NOTE — Telephone Encounter (Signed)
Transition Care Management Follow-up Telephone Call Date of discharge and from where: [facility] 02/20/2022 How have you been since you were released from the hospital? tired Any questions or concerns? No  Items Reviewed: Did the pt receive and understand the discharge instructions provided? Yes  Medications obtained and verified? Yes  Other? No  Any new allergies since your discharge? No  Dietary orders reviewed? Yes Do you have support at home? No   Home Care and Equipment/Supplies: Were home health services ordered? no If so, what is the name of the agency? N/a  Has the agency set up a time to come to the patient's home? not applicable Were any new equipment or medical supplies ordered?  No What is the name of the medical supply agency? N/a Were you able to get the supplies/equipment? not applicable Do you have any questions related to the use of the equipment or supplies? No  Functional Questionnaire: (I = Independent and D = Dependent) ADLs: I  Bathing/Dressing- I  Meal Prep- I  Eating- I  Maintaining continence- I  Transferring/Ambulation- I  Managing Meds- I  Follow up appointments reviewed:  PCP Hospital f/u appt confirmed? Yes  Scheduled to see Dr Quay Burow on 03/02/2022 @ 2:00. [facility] f/u appt confirmed? Yes  Scheduled to see Dr Raquel James on 03/14/2022 @ 2:30. Are transportation arrangements needed? No  If their condition worsens, is the pt aware to call PCP or go to the Emergency Dept.? Yes Was the patient provided with contact information for the PCP's office or ED? Yes Was to pt encouraged to call back with questions or concerns? Yes  Juanda Crumble, LPN [facility] [phone]

## 2022-02-22 LAB — SURGICAL PATHOLOGY

## 2022-02-23 ENCOUNTER — Encounter: Payer: Self-pay | Admitting: Gastroenterology

## 2022-02-24 ENCOUNTER — Encounter (HOSPITAL_COMMUNITY): Payer: Self-pay | Admitting: Gastroenterology

## 2022-02-27 ENCOUNTER — Inpatient Hospital Stay (HOSPITAL_BASED_OUTPATIENT_CLINIC_OR_DEPARTMENT_OTHER): Payer: Medicare Other | Admitting: Oncology

## 2022-02-27 ENCOUNTER — Other Ambulatory Visit: Payer: Self-pay

## 2022-02-27 ENCOUNTER — Inpatient Hospital Stay: Payer: Medicare Other | Attending: Oncology

## 2022-02-27 VITALS — BP 147/58 | HR 80 | Temp 98.1°F | Resp 17 | Ht 62.0 in | Wt 147.6 lb

## 2022-02-27 DIAGNOSIS — D693 Immune thrombocytopenic purpura: Secondary | ICD-10-CM | POA: Insufficient documentation

## 2022-02-27 DIAGNOSIS — Z7952 Long term (current) use of systemic steroids: Secondary | ICD-10-CM | POA: Diagnosis not present

## 2022-02-27 DIAGNOSIS — M810 Age-related osteoporosis without current pathological fracture: Secondary | ICD-10-CM | POA: Insufficient documentation

## 2022-02-27 LAB — CBC WITH DIFFERENTIAL (CANCER CENTER ONLY)
Abs Immature Granulocytes: 0.04 10*3/uL (ref 0.00–0.07)
Basophils Absolute: 0.1 10*3/uL (ref 0.0–0.1)
Basophils Relative: 1 %
Eosinophils Absolute: 0.1 10*3/uL (ref 0.0–0.5)
Eosinophils Relative: 1 %
HCT: 35.2 % — ABNORMAL LOW (ref 36.0–46.0)
Hemoglobin: 11.6 g/dL — ABNORMAL LOW (ref 12.0–15.0)
Immature Granulocytes: 0 %
Lymphocytes Relative: 26 %
Lymphs Abs: 2.7 10*3/uL (ref 0.7–4.0)
MCH: 29.3 pg (ref 26.0–34.0)
MCHC: 33 g/dL (ref 30.0–36.0)
MCV: 88.9 fL (ref 80.0–100.0)
Monocytes Absolute: 0.7 10*3/uL (ref 0.1–1.0)
Monocytes Relative: 7 %
Neutro Abs: 6.8 10*3/uL (ref 1.7–7.7)
Neutrophils Relative %: 65 %
Platelet Count: 254 10*3/uL (ref 150–400)
RBC: 3.96 MIL/uL (ref 3.87–5.11)
RDW: 14.7 % (ref 11.5–15.5)
WBC Count: 10.4 10*3/uL (ref 4.0–10.5)
nRBC: 0 % (ref 0.0–0.2)

## 2022-02-27 LAB — CMP (CANCER CENTER ONLY)
ALT: 15 U/L (ref 0–44)
AST: 19 U/L (ref 15–41)
Albumin: 3.9 g/dL (ref 3.5–5.0)
Alkaline Phosphatase: 63 U/L (ref 38–126)
Anion gap: 9 (ref 5–15)
BUN: 15 mg/dL (ref 8–23)
CO2: 29 mmol/L (ref 22–32)
Calcium: 9.4 mg/dL (ref 8.9–10.3)
Chloride: 104 mmol/L (ref 98–111)
Creatinine: 0.89 mg/dL (ref 0.44–1.00)
GFR, Estimated: >60 mL/min (ref >60)
Glucose, Bld: 123 mg/dL — ABNORMAL HIGH (ref 70–99)
Potassium: 4.4 mmol/L (ref 3.5–5.1)
Sodium: 142 mmol/L (ref 135–145)
Total Bilirubin: 0.3 mg/dL (ref 0.3–1.2)
Total Protein: 6.5 g/dL (ref 6.5–8.1)

## 2022-02-27 LAB — IRON AND IRON BINDING CAPACITY (CC-WL,HP ONLY)
Iron: 34 ug/dL (ref 28–170)
Saturation Ratios: 9 % — ABNORMAL LOW (ref 10.4–31.8)
TIBC: 388 ug/dL (ref 250–450)
UIBC: 354 ug/dL (ref 148–442)

## 2022-02-27 NOTE — Progress Notes (Signed)
Hematology and Oncology Follow Up Visit  Tamara Robertson [id number] [birth year] 88 y.o. 02/27/2022 2:23 PM   Principle Diagnosis: 88 year old woman with ITP diagnosed in 2000.      Prior Therapy:  She is S/P splenectomy and subsequently treated with high doses of steroids. The patient have had a complete response to steroids back in the 60s and all the way have had a few relapses. Every time she has a relapse she gets restarted on high-dose of steroids and she achieved a complete response. The most recent of relapses before her move to [facility] she was hospitalized for a platelet count of 8000 around the year 2000.  Current therapy: Prednisone to 2.5 mg every other day.   Interim History: Tamara Robertson presents today for repeat evaluation.  Since the last visit, she was hospitalized last week for GI bleeding although her platelet count was close to normal range.  On October 17 her platelet count was 154.  Clinically, she reports no medic easier, melena or hemoptysis.  She does report some mild fatigue and tiredness but still able to perform all activities of daily living.       Medications: Updated on review. Current Outpatient Medications  Medication Sig Dispense Refill   acetaminophen (TYLENOL) 500 MG tablet Take 500 mg by mouth every 6 (six) hours as needed for moderate pain.     Biotin 2500 MCG CAPS Take 1 capsule by mouth daily.      calcium carbonate (TUMS - DOSED IN MG ELEMENTAL CALCIUM) 500 MG chewable tablet Chew 1 tablet by mouth daily as needed for indigestion or heartburn.     cholecalciferol (VITAMIN D3) 25 MCG (1000 UNIT) tablet Take 1,000 Units by mouth daily.     escitalopram (LEXAPRO) 5 MG tablet Take 1 tablet (5 mg total) by mouth daily. 90 tablet 3   famotidine (PEPCID) 20 MG tablet Take 1 tablet (20 mg total) by mouth 2 (two) times daily as needed for heartburn or indigestion. 180 tablet 3   Flaxseed MISC Take 5 mLs by mouth daily. Take 1 tsp daily     furosemide (LASIX)  20 MG tablet Take 1.5 tablets (30 mg total) by mouth daily. 135 tablet 3   Glucos-Chond-Hyal Ac-Ca Fructo (MOVE FREE JOINT HEALTH ADVANCE PO) Take 1 tablet by mouth daily.     lidocaine (LMX) 4 % cream Apply 1 application  topically daily as needed (inflammation).     lidocaine 4 % Place 1 patch onto the skin daily.     Magnesium Oxide 250 MG TABS Take 1 tablet by mouth.     metoprolol succinate (TOPROL-XL) 25 MG 24 hr tablet TAKE 1/2 (ONE-HALF) TABLET BY MOUTH ONCE DAILY WITH OR IMMEDIATELY FOLLOWING A MEAL 45 tablet 10   Multiple Vitamin (MULTIVITAMIN) tablet Take 1 tablet by mouth daily.       polyethylene glycol (MIRALAX / GLYCOLAX) packet Take 17 g by mouth daily as needed for moderate constipation.      predniSONE (DELTASONE) 2.5 MG tablet Take 1 tablet (2.5 mg total) by mouth every other day. 45 tablet 3   Probiotic Product (PROBIOTIC DAILY PO) Take 1 tablet by mouth daily with breakfast.     PSYLLIUM PO Take 1 tablet by mouth daily as needed (constipation).     triamcinolone cream (KENALOG) 0.1 % Apply 1 Application topically 2 (two) times daily as needed (rash).     trolamine salicylate (ASPERCREME) 10 % cream Apply 1 application topically as needed for muscle pain.  No current facility-administered medications for this visit.    Allergies:  Allergies  Allergen Reactions   Aspirin Other (See Comments)    ITP   Sulfa Antibiotics Other (See Comments)    dizziness   Contrast Media [Iodinated Contrast Media] Hives    CAT scan contrast only   Shellfish-Derived Products Hives    Other reaction(s): Other   Eliquis [Apixaban]     Rash    Fluoxetine     Did not feel well on it    Sulfamethoxazole-Trimethoprim     Other reaction(s): Unknown   Celebrex [Celecoxib] Other (See Comments)    Leg swelling   Celexa [Citalopram] Other (See Comments)    Felt out of it   Statins Other (See Comments)      Physical Exam:    Blood pressure (!) 147/58, pulse 80, temperature 98.1  F (36.7 C), temperature source Temporal, resp. rate 17, height 5\' 2"  (1.575 m), weight 147 lb 9.6 oz (67 kg), SpO2 100 %.    ECOG: 1    General appearance: Alert, awake without any distress. Head: Atraumatic without abnormalities Oropharynx: Without any thrush or ulcers. Eyes: No scleral icterus. Lymph nodes: No lymphadenopathy noted in the cervical, supraclavicular, or axillary nodes Heart:regular rate and rhythm, without any murmurs or gallops.   Lung: Clear to auscultation without any rhonchi, wheezes or dullness to percussion. Abdomin: Soft, nontender without any shifting dullness or ascites. Musculoskeletal: No clubbing or cyanosis. Neurological: No motor or sensory deficits. Skin: No rashes or lesions.          Lab Results: Lab Results  Component Value Date   WBC 13.1 (H) 02/20/2022   HGB 11.2 (L) 02/20/2022   HCT 33.9 (L) 02/20/2022   MCV 88.7 02/20/2022   PLT 154 02/20/2022     Chemistry      Component Value Date/Time   NA 140 02/20/2022 0307   NA 142 04/04/2021 1627   NA 142 02/16/2016 1526   K 4.0 02/20/2022 0307   K 4.3 02/16/2016 1526   CL 110 02/20/2022 0307   CL 105 04/17/2012 1452   CO2 24 02/20/2022 0307   CO2 27 02/16/2016 1526   BUN 15 02/20/2022 0307   BUN 17 04/04/2021 1627   BUN 14.3 02/16/2016 1526   CREATININE 0.74 02/20/2022 0307   CREATININE 0.82 01/20/2020 1525   CREATININE 0.8 02/16/2016 1526      Component Value Date/Time   CALCIUM 8.6 (L) 02/20/2022 0307   CALCIUM 9.5 02/16/2016 1526   ALKPHOS 57 02/17/2022 1415   ALKPHOS 92 02/16/2016 1526   AST 22 02/17/2022 1415   AST 19 02/16/2016 1526   ALT 14 02/17/2022 1415   ALT 11 02/16/2016 1526   BILITOT 0.9 02/17/2022 1415   BILITOT 0.33 02/16/2016 1526      Impression and Plan:   88 year old woman with:  1.  Chronic ITP diagnosed in 2000.  She continues to be in remission without any evidence of relapse or requirement for treatment.   The natural course of this  disease was reviewed at this time and risks and benefits of additional treatment such as IVIG, rituximab among others were reiterated and these will be deferred if needed in the future.  I recommended continuing maintenance prednisone low-dose at this time.  2. Osteoporosis: She remains on calcium and vitamin D supplements.  3.  GI bleeding:  will update her iron studies today.  Recommend replacement if needed.   4. Follow-up: In 1 year for  repeat follow-up.  30  minutes were spent on this encounter.  The time was dedicated to reviewing laboratory data, disease status update and outlining future plan of care discussion.    Eli Hose, MD 10/24/20232:23 PM

## 2022-02-28 ENCOUNTER — Telehealth: Payer: Self-pay | Admitting: *Deleted

## 2022-02-28 DIAGNOSIS — M545 Low back pain, unspecified: Secondary | ICD-10-CM | POA: Insufficient documentation

## 2022-02-28 LAB — FERRITIN: Ferritin: 18 ng/mL (ref 11–307)

## 2022-02-28 NOTE — Telephone Encounter (Signed)
-----   Message from Wyatt Portela, MD sent at 02/28/2022  9:03 AM EDT ----- Please let her know her iron and K are normal

## 2022-02-28 NOTE — Telephone Encounter (Signed)
PC to patient, informed her of Dr Hazeline Junker message as below, she verbalizes understanding.

## 2022-03-01 ENCOUNTER — Encounter: Payer: Self-pay | Admitting: Internal Medicine

## 2022-03-01 NOTE — Progress Notes (Unsigned)
Subjective:    Patient ID: Tamara Robertson, female    DOB: Feb 01, 1930, 88 y.o.   MRN: [id number]     HPI Tamara Robertson is here for follow up from the hospital  - TCM.  Recommendations for Outpatient Follow-up:  Follow up with PCP in 1-2 weeks Please obtain BMP/CBC in one week Follow up with Dr. Rhea Belton as scheduled  Admitted 10/15 - 10/17 for rectal bleeding, hemorrhagic shock   Presented with acute painless rectal bleeding.  No abd pain, lightheadedness/dizziness, CP, SOB.  In ED initially hypotensive, 3 episodes rectal bleeding in ED, given fluids and 3 units PRBCs.  WBC 11.5, Hbg 11.1, plt 146, lactic acid 3.0 -> 1.8.  CTA abd/pelvis - no active GI bleeding.  Initially on IV PPI -> PO PPI.  GI consulted.  H/h stable while in hospital.   EGD, flex sig done 10/15  Mild leukocytosis - likely reactive, no signs of infection.  Afib - in SR.  Not on a/c.  Htn - antihypertensives held.  CAD - stable, no again.  H/o ITP - plt count 146  EGD - no gross lesion.  Z line irregular, 35 cm from incisors.  Gastritis, biopsied.  No gross lesions in duodenal bulb, first and second portion of duodenum  Flex sig - perianal skin tags, hemorrhoids, blood in entire examined colon - lavaged w/ incomplete clearance - fair visualization. No active bleeding.  Diverticulosis in entire colon.  Non bleeding internal and external hemorrhoids.  Colonoscopy - day after flex sig - clips placed where they was possible bleeding.    3 times this past week she has three occular migraines.  She will get temple headaches - she has had this in the past.    Medications and allergies reviewed with patient and updated if appropriate.  Current Outpatient Medications on File Prior to Visit  Medication Sig Dispense Refill   acetaminophen (TYLENOL) 500 MG tablet Take 500 mg by mouth every 6 (six) hours as needed for moderate pain.     Biotin 2500 MCG CAPS Take 1 capsule by mouth daily.      calcium carbonate (TUMS - DOSED IN MG  ELEMENTAL CALCIUM) 500 MG chewable tablet Chew 1 tablet by mouth daily as needed for indigestion or heartburn.     cholecalciferol (VITAMIN D3) 25 MCG (1000 UNIT) tablet Take 1,000 Units by mouth daily.     escitalopram (LEXAPRO) 5 MG tablet Take 1 tablet (5 mg total) by mouth daily. 90 tablet 3   famotidine (PEPCID) 20 MG tablet Take 1 tablet (20 mg total) by mouth 2 (two) times daily as needed for heartburn or indigestion. 180 tablet 3   Flaxseed MISC Take 5 mLs by mouth daily. Take 1 tsp daily     furosemide (LASIX) 20 MG tablet Take 1.5 tablets (30 mg total) by mouth daily. 135 tablet 3   Glucos-Chond-Hyal Ac-Ca Fructo (MOVE FREE JOINT HEALTH ADVANCE PO) Take 1 tablet by mouth daily.     lidocaine (LMX) 4 % cream Apply 1 application  topically daily as needed (inflammation).     lidocaine 4 % Place 1 patch onto the skin daily.     Magnesium Oxide 250 MG TABS Take 1 tablet by mouth.     metoprolol succinate (TOPROL-XL) 25 MG 24 hr tablet TAKE 1/2 (ONE-HALF) TABLET BY MOUTH ONCE DAILY WITH OR IMMEDIATELY FOLLOWING A MEAL 45 tablet 10   Multiple Vitamin (MULTIVITAMIN) tablet Take 1 tablet by mouth daily.  polyethylene glycol (MIRALAX / GLYCOLAX) packet Take 17 g by mouth daily as needed for moderate constipation.      predniSONE (DELTASONE) 2.5 MG tablet Take 1 tablet (2.5 mg total) by mouth every other day. 45 tablet 3   Probiotic Product (PROBIOTIC DAILY PO) Take 1 tablet by mouth daily with breakfast.     PSYLLIUM PO Take 1 tablet by mouth daily as needed (constipation).     triamcinolone cream (KENALOG) 0.1 % Apply 1 Application topically 2 (two) times daily as needed (rash).     trolamine salicylate (ASPERCREME) 10 % cream Apply 1 application topically as needed for muscle pain.     [DISCONTINUED] metoprolol succinate (TOPROL-XL) 25 MG 24 hr tablet Take 1 tablet (25 mg total) by mouth daily. 30 tablet 1   No current facility-administered medications on file prior to visit.      Review of Systems  Constitutional:  Positive for fatigue. Negative for appetite change and fever.  Respiratory:  Positive for shortness of breath.   Cardiovascular:  Positive for palpitations (mild). Negative for chest pain.  Gastrointestinal:  Negative for abdominal pain, blood in stool, constipation and diarrhea.  Musculoskeletal:  Positive for back pain.  Neurological:  Positive for dizziness and light-headedness. Negative for headaches.  Psychiatric/Behavioral:  Negative for sleep disturbance.        Objective:   Vitals:   03/02/22 1441  BP: (!) 130/58  Pulse: 76  Temp: 97.9 F (36.6 C)  SpO2: 99%   BP Readings from Last 3 Encounters:  03/02/22 (!) 130/58  02/27/22 (!) 147/58  02/20/22 (!) 154/53   Wt Readings from Last 3 Encounters:  03/02/22 147 lb 9.6 oz (67 kg)  02/27/22 147 lb 9.6 oz (67 kg)  02/18/22 145 lb (65.8 kg)   Body mass index is 27 kg/m.    Physical Exam     Lab Results  Component Value Date   WBC 10.4 02/27/2022   HGB 11.6 (L) 02/27/2022   HCT 35.2 (L) 02/27/2022   PLT 254 02/27/2022   GLUCOSE 123 (H) 02/27/2022   CHOL 190 12/14/2020   TRIG 262.0 (H) 12/14/2020   HDL 46.00 12/14/2020   LDLDIRECT 118.0 12/14/2020   LDLCALC 107 (H) 05/23/2017   ALT 15 02/27/2022   AST 19 02/27/2022   NA 142 02/27/2022   K 4.4 02/27/2022   CL 104 02/27/2022   CREATININE 0.89 02/27/2022   BUN 15 02/27/2022   CO2 29 02/27/2022   TSH 1.984 01/06/2021   INR 1.1 02/17/2022   HGBA1C 5.8 12/14/2020   CT Angio Abd/Pel W and/or Wo Contrast CLINICAL DATA:  Lower GI bleed, rectal bleeding  EXAM: CTA ABDOMEN AND PELVIS WITHOUT AND WITH CONTRAST  TECHNIQUE: Multidetector CT imaging of the abdomen and pelvis was performed using the standard protocol during bolus administration of intravenous contrast. Multiplanar reconstructed images and MIPs were obtained and reviewed to evaluate the vascular anatomy.  RADIATION DOSE REDUCTION: This exam was  performed according to the departmental dose-optimization program which includes automated exposure control, adjustment of the mA and/or kV according to patient size and/or use of iterative reconstruction technique.  CONTRAST:  OMNIPAQUE IOHEXOL 350 MG/ML SOLN  COMPARISON:  CT abdomen/pelvis dated 11/16/2021.  FINDINGS: Lower chest: Lung bases are clear.  Hepatobiliary: Liver is within normal limits.  Status post cholecystectomy. No intrahepatic or extrahepatic ductal dilatation.  Pancreas: Within normal limits.  Spleen: Status post splenectomy.  Adrenals/Urinary Tract: Adrenal glands are within normal limits.  3.3 cm  mildly proteinaceous right upper pole renal cyst, without convincing enhancement following contrast administration, benign (Bosniak II). No follow-up is recommended. Kidneys are otherwise within normal limits. No hydronephrosis.  Bladder is within normal limits.  Stomach/Bowel: Stomach is within normal limits.  No evidence of bowel obstruction.  Appendix is not discretely visualized.  Extensive colonic diverticulosis, without evidence of diverticulitis.  Status post left hemicolectomy with suture line in the lower pelvis (series 13/image 61).  Following contrast administration, there is no active intravasation of contrast into the bowel lumen to suggest active GI bleeding.  Vascular/Lymphatic: No evidence of abdominal aortic aneurysm.  Atherosclerotic calcifications of the abdominal aorta and branch vessels.  No suspicious abdominopelvic lymphadenopathy.  Reproductive: Status post hysterectomy.  No adnexal mass.  5.1 cm simple left adnexal mass (series 13/image 56), chronic and non FDG avid on prior PET. Given the patient's age, no follow-up is recommended.  Other: No abdominopelvic ascites.  Musculoskeletal: Degenerative changes of the visualized thoracolumbar spine.  IMPRESSION: No evidence of active GI bleeding.  Status post  left hemicolectomy. Extensive colonic diverticulosis, without evidence of diverticulitis.  Additional ancillary findings as above.  Electronically Signed   By: Charline Bills M.D.   On: 02/17/2022 20:53    Assessment & Plan:    See Problem List for Assessment and Plan of chronic medical problems.

## 2022-03-02 ENCOUNTER — Ambulatory Visit (INDEPENDENT_AMBULATORY_CARE_PROVIDER_SITE_OTHER): Payer: Medicare Other | Admitting: Internal Medicine

## 2022-03-02 VITALS — BP 130/58 | HR 76 | Temp 97.9°F | Ht 62.0 in | Wt 147.6 lb

## 2022-03-02 DIAGNOSIS — I10 Essential (primary) hypertension: Secondary | ICD-10-CM | POA: Diagnosis not present

## 2022-03-02 DIAGNOSIS — R42 Dizziness and giddiness: Secondary | ICD-10-CM

## 2022-03-02 DIAGNOSIS — F32A Depression, unspecified: Secondary | ICD-10-CM

## 2022-03-02 DIAGNOSIS — K922 Gastrointestinal hemorrhage, unspecified: Secondary | ICD-10-CM

## 2022-03-02 DIAGNOSIS — D693 Immune thrombocytopenic purpura: Secondary | ICD-10-CM

## 2022-03-02 DIAGNOSIS — F419 Anxiety disorder, unspecified: Secondary | ICD-10-CM

## 2022-03-02 NOTE — Patient Instructions (Signed)
    Medications changes include :   none    

## 2022-03-03 NOTE — Assessment & Plan Note (Signed)
Chronic Controlled, stable Continue lexapro 5 mg daily

## 2022-03-03 NOTE — Assessment & Plan Note (Signed)
Acute - diverticular bleed  Blood counts from earlier this week improved No symptoms c/w active bleed Has f/u with GI

## 2022-03-03 NOTE — Assessment & Plan Note (Signed)
Chronic Platelet count earlier this week normal On prednisone 2.5 mg daily  Saw hem/onc recently

## 2022-03-03 NOTE — Assessment & Plan Note (Signed)
Chronic BP well controlled Continue metoprolol xl 12.5 mg daily cmp done earlier this week

## 2022-03-03 NOTE — Assessment & Plan Note (Signed)
Chronic - worse recently with GIB Improving since being discharged from the hospital Monitor She is eating well and drinking a good amount of water

## 2022-03-05 ENCOUNTER — Ambulatory Visit
Admission: RE | Admit: 2022-03-05 | Discharge: 2022-03-05 | Disposition: A | Discharge status: Home / Self Care | Payer: Medicare Other | Source: Ambulatory Visit | Attending: Internal Medicine | Admitting: Internal Medicine

## 2022-03-05 DIAGNOSIS — Z1231 Encounter for screening mammogram for malignant neoplasm of breast: Secondary | ICD-10-CM

## 2022-03-09 ENCOUNTER — Encounter: Payer: Self-pay | Admitting: *Deleted

## 2022-03-14 ENCOUNTER — Ambulatory Visit (INDEPENDENT_AMBULATORY_CARE_PROVIDER_SITE_OTHER): Payer: Medicare Other | Admitting: Internal Medicine

## 2022-03-14 ENCOUNTER — Encounter: Payer: Self-pay | Admitting: Internal Medicine

## 2022-03-14 VITALS — BP 124/76 | HR 82 | Ht 62.0 in | Wt 148.0 lb

## 2022-03-14 DIAGNOSIS — D649 Anemia, unspecified: Secondary | ICD-10-CM

## 2022-03-14 DIAGNOSIS — D500 Iron deficiency anemia secondary to blood loss (chronic): Secondary | ICD-10-CM | POA: Diagnosis not present

## 2022-03-14 DIAGNOSIS — D509 Iron deficiency anemia, unspecified: Secondary | ICD-10-CM | POA: Insufficient documentation

## 2022-03-14 DIAGNOSIS — K5900 Constipation, unspecified: Secondary | ICD-10-CM | POA: Diagnosis not present

## 2022-03-14 DIAGNOSIS — K5731 Diverticulosis of large intestine without perforation or abscess with bleeding: Secondary | ICD-10-CM | POA: Diagnosis not present

## 2022-03-14 NOTE — Patient Instructions (Addendum)
  If you are age 86 or older, your body mass index should be between 23-30. Your Body mass index is 27.07 kg/m. If this is out of the aforementioned range listed, please consider follow up with your Primary Care Provider.  If you are age 22 or younger, your body mass index should be between 19-25. Your Body mass index is 27.07 kg/m. If this is out of the aformentioned range listed, please consider follow up with your Primary Care Provider.   ________________________________________________________  The [facility] providers would like to encourage you to use [facility] to communicate with providers for non-urgent requests or questions.  Due to long hold times on the telephone, sending your provider a message by [facility] may be a faster and more efficient way to get a response.  Please allow 48 business hours for a response.  Please remember that this is for non-urgent requests.   Your provider has requested that you go to the basement level for lab work on or around 05/07/2022. Press "B" on the elevator. The lab is located at the first door on the left as you exit the elevator.   Due to recent changes in healthcare laws, you may see the results of your imaging and laboratory studies on MyChart before your provider has had a chance to review them.  We understand that in some cases there may be results that are confusing or concerning to you. Not all laboratory results come back in the same time frame and the provider may be waiting for multiple results in order to interpret others.  Please give [place] 48 hours in order for your provider to thoroughly review all the results before contacting the office for clarification of your results.    You will need a 3 month follow up in February 2024 we will contact you to schedule appointment   Thank you for entrusting me with your care and choosing [facility].  Dr Hilarie Fredrickson

## 2022-03-14 NOTE — Progress Notes (Signed)
Subjective:    Patient ID: Tamara Robertson, female    DOB: 12/15/1929, 88 y.o.   MRN: [id number]  HPI [name] is a 88 year old female with a recent history of lower GI bleed secondary to diverticulosis, history of GERD, IBS with chronic constipation, history of complicated diverticular disease requiring sigmoidectomy in 2007 for stricture, prior SBO felt secondary to adhesive disease, peripheral vascular disease, history of ITP, prior cholecystectomy and splenectomy, recent diagnosis of lung cancer on observation who is seen for follow-up.   Unfortunately she was hospitalized for lower GI bleed requiring 3 units of packed cells in October 2023.  She underwent upper endoscopy and eventual colonoscopy.  Colonoscopy revealed a shallow diverticulum with visible vessel in the cecum which was clipped.  There were multiple diverticula throughout the colon.  The upper endoscopy other than mild patchy gastritis was unremarkable.  She has had no further bleeding and in fact her bowel movements have been about as regular as they have been in quite some time.  She does fear looking down after bowel movement and seeing blood but this is not happened.  She continues to be quite tired and fatigued more than she is used to as well as some noticeable increase in dyspnea with exertion.  No chest pain.  She previously tried oral iron and had severe constipation   Review of Systems As per HPI, otherwise negative  Current Medications, Allergies, Past Medical History, Past Surgical History, Family History and Social History were reviewed in [employer] record.     Objective:   Physical Exam BP 124/76   Pulse 82   Ht 5\' 2"  (1.575 m)   Wt 148 lb (67.1 kg)   BMI 27.07 kg/m  Gen: awake, alert, NAD HEENT: anicteric, op clear CV: RRR, no mrg Pulm: CTA b/l Abd: soft, NT/ND, +BS throughout Ext: no c/c/e Neuro: nonfocal  Iron/TIBC/Ferritin/ %Sat    Component Value Date/Time   IRON 34  02/27/2022 1428   TIBC 388 02/27/2022 1428   FERRITIN 18 02/27/2022 1446   IRONPCTSAT 9 (L) 02/27/2022 1428      Latest Ref Rng & Units 02/27/2022    2:28 PM 02/20/2022   12:28 PM 02/20/2022    3:07 AM  CBC  WBC 4.0 - 10.5 K/uL 10.4  13.1  12.5   Hemoglobin 12.0 - 15.0 g/dL 28.4  13.2  44.0   Hematocrit 36.0 - 46.0 % 35.2  33.9  32.0   Platelets 150 - 400 K/uL 254  154  138         Assessment & Plan:  88 year old female with a recent history of lower GI bleed secondary to diverticulosis, history of GERD, IBS with chronic constipation, history of complicated diverticular disease requiring sigmoidectomy in 2007 for stricture, prior SBO felt secondary to adhesive disease, peripheral vascular disease, history of ITP, prior cholecystectomy and splenectomy, recent diagnosis of lung cancer on observation who is seen for follow-up.    Recent colonic diverticular hemorrhage with acute blood loss anemia/borderline iron deficiency --previously I had seen her for isolated melena and borderline iron studies without anemia or anemic symptoms.  She tried oral iron but had terrible constipation which resulted leading to an ER visit for fecal impaction.  Fortunately Dr. Myrtie Neither was able to find the most likely culprit diverticulum and clipped it.  It is her hope and certainly mind that this never recurs.  Given her symptoms, low iron saturation and borderline low ferritin with persistent anemia I  have recommended IV iron.  We discussed the risk, benefits and alternatives and she wishes to proceed.  She has been clearly intolerant to oral iron -- Monitor for recurrent blood in stool and let me know if this occurs -- Will order Feraheme 510 mg IV x1 from the [facility] infusion center -- Check CBC and IBC plus ferritin in 8 weeks -- See me in about 3 months  2.  History of constipation --less of an issue after recent colonoscopy.  Can resume MiraLAX 17 g daily whenever needed  3.  Probable lung cancer --following  up with Dr. Delton Coombes and currently on surveillance with no imminent plans for treatment  30 minutes total spent today including patient facing time, coordination of care, reviewing medical history/procedures/pertinent radiology studies, and documentation of the encounter.

## 2022-03-15 ENCOUNTER — Telehealth: Payer: Self-pay | Admitting: Pharmacy Technician

## 2022-03-15 ENCOUNTER — Other Ambulatory Visit: Payer: Self-pay | Admitting: Pharmacy Technician

## 2022-03-15 NOTE — Telephone Encounter (Signed)
Auth Submission: NO AUTH NEEDED Payer: [employer] MEDICARE Medication & CPT/J Code(s) submitted: Feraheme (ferumoxytol) [id number] Route of submission (phone, fax, portal):  Phone # Fax # Auth type: Buy/Bill Units/visits requested: X1 DOSE Reference number:  Approval from: 03/12/22 to 05/06/22

## 2022-03-19 ENCOUNTER — Ambulatory Visit (INDEPENDENT_AMBULATORY_CARE_PROVIDER_SITE_OTHER): Payer: Medicare Other | Admitting: Podiatry

## 2022-03-19 ENCOUNTER — Encounter: Payer: Self-pay | Admitting: Podiatry

## 2022-03-19 DIAGNOSIS — M79675 Pain in left toe(s): Secondary | ICD-10-CM | POA: Diagnosis not present

## 2022-03-19 DIAGNOSIS — I739 Peripheral vascular disease, unspecified: Secondary | ICD-10-CM

## 2022-03-19 DIAGNOSIS — B351 Tinea unguium: Secondary | ICD-10-CM

## 2022-03-19 DIAGNOSIS — M79674 Pain in right toe(s): Secondary | ICD-10-CM | POA: Diagnosis not present

## 2022-03-19 NOTE — Progress Notes (Signed)
This patient returns to my office for at risk foot care.  This patient requires this care by a professional since this patient will be at risk due to having thrombocytopenia, peripheral neuropathy and pvd. This patient is unable to cut nails himself since the patient cannot reach his nails.These nails are painful walking and wearing shoes.  This patient presents for at risk foot care today.  General Appearance  Alert, conversant and in no acute stress.  Vascular  Dorsalis pedis and posterior tibial  pulses are  weakly palpable  bilaterally.  Capillary return is within normal limits  bilaterally. Temperature is within normal limits  bilaterally.  Neurologic  Senn-Weinstein monofilament wire test within normal limits  bilaterally. Muscle power within normal limits bilaterally.  Nails Thick disfigured discolored nails with subungual debris  from hallux to fifth toes bilaterally. No evidence of bacterial infection or drainage bilaterally.  Orthopedic  No limitations of motion  feet .  No crepitus or effusions noted.  No bony pathology or digital deformities noted.  Skin  normotropic skin with no porokeratosis noted bilaterally.  No signs of infections or ulcers noted.     Onychomycosis  Pain in right toes  Pain in left toes  Consent was obtained for treatment procedures.   Mechanical debridement of nails 1-5  bilaterally performed with a nail nipper.  Filed with dremel without incident.    Return office visit    8 weeks                  Told patient to return for periodic foot care and evaluation due to potential at risk complications.   Gardiner Barefoot DPM

## 2022-03-21 ENCOUNTER — Ambulatory Visit (INDEPENDENT_AMBULATORY_CARE_PROVIDER_SITE_OTHER): Payer: Medicare Other | Admitting: *Deleted

## 2022-03-21 VITALS — BP 174/66 | HR 72 | Temp 97.6°F | Resp 16 | Ht 61.0 in | Wt 148.2 lb

## 2022-03-21 DIAGNOSIS — K5731 Diverticulosis of large intestine without perforation or abscess with bleeding: Secondary | ICD-10-CM | POA: Diagnosis not present

## 2022-03-21 DIAGNOSIS — D649 Anemia, unspecified: Secondary | ICD-10-CM | POA: Diagnosis not present

## 2022-03-21 DIAGNOSIS — D509 Iron deficiency anemia, unspecified: Secondary | ICD-10-CM

## 2022-03-21 MED ORDER — ACETAMINOPHEN 325 MG PO TABS
650.0000 mg | ORAL_TABLET | Freq: Once | ORAL | Status: AC
  Administered 2022-03-21: 650 mg via ORAL
  Filled 2022-03-21: qty 2

## 2022-03-21 MED ORDER — SODIUM CHLORIDE 0.9 % IV SOLN
510.0000 mg | Freq: Once | INTRAVENOUS | Status: AC
  Administered 2022-03-21: 510 mg via INTRAVENOUS
  Filled 2022-03-21: qty 17

## 2022-03-21 MED ORDER — DIPHENHYDRAMINE HCL 25 MG PO CAPS
25.0000 mg | ORAL_CAPSULE | Freq: Once | ORAL | Status: AC
  Administered 2022-03-21: 25 mg via ORAL
  Filled 2022-03-21: qty 1

## 2022-03-21 NOTE — Progress Notes (Signed)
Diagnosis: Iron Deficiency Anemia  Provider:  Marshell Garfinkel MD  Procedure: Infusion  IV Type: Peripheral, IV Location: L Antecubital  Venofer (Iron Sucrose), Dose: 200 mg  Infusion Start Time: 0981 pm  Infusion Stop Time: 1914 pm  Post Infusion IV Care: Observation period completed and Peripheral IV Discontinued  Discharge: Condition: Good, Destination: Home . AVS provided to patient.   Performed by:  Oren Beckmann, RN

## 2022-04-02 ENCOUNTER — Encounter: Payer: Self-pay | Admitting: Internal Medicine

## 2022-04-03 ENCOUNTER — Telehealth: Payer: Self-pay

## 2022-04-03 ENCOUNTER — Ambulatory Visit (INDEPENDENT_AMBULATORY_CARE_PROVIDER_SITE_OTHER): Payer: Medicare Other | Admitting: Obstetrics & Gynecology

## 2022-04-03 ENCOUNTER — Encounter: Payer: Self-pay | Admitting: Obstetrics & Gynecology

## 2022-04-03 VITALS — BP 152/88 | HR 68 | Resp 14 | Ht 60.5 in | Wt 147.0 lb

## 2022-04-03 DIAGNOSIS — I739 Peripheral vascular disease, unspecified: Secondary | ICD-10-CM

## 2022-04-03 DIAGNOSIS — M8589 Other specified disorders of bone density and structure, multiple sites: Secondary | ICD-10-CM

## 2022-04-03 DIAGNOSIS — Z9189 Other specified personal risk factors, not elsewhere classified: Secondary | ICD-10-CM | POA: Diagnosis not present

## 2022-04-03 DIAGNOSIS — Z01419 Encounter for gynecological examination (general) (routine) without abnormal findings: Secondary | ICD-10-CM

## 2022-04-03 DIAGNOSIS — I872 Venous insufficiency (chronic) (peripheral): Secondary | ICD-10-CM

## 2022-04-03 DIAGNOSIS — I83892 Varicose veins of left lower extremities with other complications: Secondary | ICD-10-CM

## 2022-04-03 DIAGNOSIS — Z780 Asymptomatic menopausal state: Secondary | ICD-10-CM

## 2022-04-03 NOTE — Progress Notes (Signed)
Tamara Robertson [birth year] [id number]   History:    88 y.o. G3P3L2   RP:  Established patient presenting for annual gyn exam    HPI: Longtime postmenopausal, well on no hormone replacement therapy.  Status post total hysterectomy.  No pelvic pain. Pap 2000.  Followed for a left ovarian cyst which showed stability and benign appearance at 4.2 cm on MRI 03/05/2020.  Abstinent.  Urine and bowel movements normal.  Had rectal bleeding from a bleeding diverticulum.  Transfused 3 U pRBCs.  Feeling better now.  Has F/U with a CBC scheduled.  Colono 02/19/22.  Breasts normal.  Screening Mammo Negative 02/2022. Body mass index 28.24.  Physically active every day. Health labs with family physician.  BD Osteopenia 04/2019.  Will repeat a BD at [facility].    Past medical history,surgical history, family history and social history were all reviewed and documented in the EPIC chart.  Gynecologic History No LMP recorded. Patient has had a hysterectomy.  Obstetric History OB History  Gravida Para Term Preterm AB Living  3 3       2   SAB IAB Ectopic Multiple Live Births               # Outcome Date GA Lbr Len/2nd Weight Sex Delivery Anes PTL Lv  3 Para           2 Para           1 Para             Obstetric Comments  1 child passed away at 20     ROS: A ROS was performed and pertinent positives and negatives are included in the history. GENERAL: No fevers or chills. HEENT: No change in vision, no earache, sore throat or sinus congestion. NECK: No pain or stiffness. CARDIOVASCULAR: No chest pain or pressure. No palpitations. PULMONARY: No shortness of breath, cough or wheeze. GASTROINTESTINAL: No abdominal pain, nausea, vomiting or diarrhea, melena or bright red blood per rectum. GENITOURINARY: No urinary frequency, urgency, hesitancy or dysuria. MUSCULOSKELETAL: No joint or muscle pain, no back pain, no recent trauma. DERMATOLOGIC: No rash, no itching, no lesions. ENDOCRINE: No polyuria, polydipsia, no  heat or cold intolerance. No recent change in weight. HEMATOLOGICAL: No anemia or easy bruising or bleeding. NEUROLOGIC: No headache, seizures, numbness, tingling or weakness. PSYCHIATRIC: No depression, no loss of interest in normal activity or change in sleep pattern.     Exam:   BP (!) 152/88   Pulse 68   Resp 14   Ht 5' 0.5" (1.537 m)   Wt 147 lb (66.7 kg)   BMI 28.24 kg/m   Body mass index is 28.24 kg/m.  General appearance : Well developed well nourished female. No acute distress HEENT: Eyes: no retinal hemorrhage or exudates,  Neck supple, trachea midline, no carotid bruits, no thyroidmegaly Lungs: Clear to auscultation, no rhonchi or wheezes, or rib retractions  Heart: Regular rate and rhythm, no murmurs or gallops Breast:Examined in sitting and supine position were symmetrical in appearance, no palpable masses or tenderness,  no skin retraction, no nipple inversion, no nipple discharge, no skin discoloration, no axillary or supraclavicular lymphadenopathy Abdomen: no palpable masses or tenderness, no rebound or guarding Extremities: no edema or skin discoloration or tenderness  Pelvic: Vulva: Normal             Vagina: No gross lesions or discharge  Cervix/Uterus absent  Adnexa  Without masses or tenderness  Anus: Normal   Assessment/Plan:  88 y.o. female for annual exam   1. Well female exam with routine gynecological exam Longtime postmenopausal, well on no hormone replacement therapy. Status post total hysterectomy.  No pelvic pain. Pap 2000.  Followed for a left ovarian cyst which showed stability and benign appearance at 4.2 cm on MRI 03/05/2020.  Abstinent.  Urine and bowel movements normal.  Had rectal bleeding from a bleeding diverticulum.  Transfused 3 U pRBCs.  Feeling better now.  Has F/U with a CBC scheduled.  Colono 02/19/22.  Breasts normal.  Screening Mammo Negative 02/2022. Body mass index 28.24.  Physically active every day. Health labs with family  physician.  BD Osteopenia 04/2019.  Will repeat a BD at [facility].   2. Postmenopausal Longtime postmenopausal, well on no hormone replacement therapy. Status post total hysterectomy.  No pelvic pain.   3. Osteopenia of multiple sites   BD Osteopenia 04/2019.  Will repeat a BD at [facility].   Tamara Robertson, 2:50 PM

## 2022-04-03 NOTE — Telephone Encounter (Signed)
Pt called c/o intermittent LLE numbness and RLE pain, and is requesting an appt.  Reviewed pt's chart, returned call for clarification, two identifiers used. Pt wanted to be seen by Dr Stanford Breed again. Based on previous office visit and studies, appts scheduled for [place] and Dr Stanford Breed for earliest available per patient preference for afternoon only. Confirmed understanding.

## 2022-04-11 ENCOUNTER — Other Ambulatory Visit: Payer: Self-pay | Admitting: Internal Medicine

## 2022-04-12 ENCOUNTER — Encounter: Payer: Self-pay | Admitting: Internal Medicine

## 2022-04-12 ENCOUNTER — Telehealth: Payer: Self-pay | Admitting: Internal Medicine

## 2022-04-12 MED ORDER — PREDNISONE 2.5 MG PO TABS: 2.5000 mg | ORAL_TABLET | ORAL | 3 refills | Status: DC

## 2022-04-12 NOTE — Telephone Encounter (Signed)
Prednisone sent - she is on it chronically - not sure why it looked lie she was not on it for a while.Marland KitchenMarland KitchenMarland Kitchen

## 2022-04-12 NOTE — Telephone Encounter (Signed)
Patient would like to know why her predisone has not been refilled - she also needs to talk to you about when Dr. Quay Burow is her tomorrow.

## 2022-04-13 NOTE — Telephone Encounter (Signed)
Spoke with patient today. 

## 2022-04-18 ENCOUNTER — Encounter: Payer: Self-pay | Admitting: Internal Medicine

## 2022-04-27 ENCOUNTER — Telehealth: Payer: Self-pay | Admitting: Internal Medicine

## 2022-04-27 MED ORDER — FAMOTIDINE 20 MG PO TABS: 20.0000 mg | ORAL_TABLET | Freq: Two times a day (BID) | ORAL | 0 refills | Status: DC | PRN

## 2022-04-27 NOTE — Telephone Encounter (Signed)
Patient is calling states her Pepcid Rx is expired and is wishing for a refill to [facility] on [address]. Please advise

## 2022-04-27 NOTE — Telephone Encounter (Signed)
Rx sent 

## 2022-04-29 ENCOUNTER — Other Ambulatory Visit: Payer: Self-pay | Admitting: Internal Medicine

## 2022-05-01 ENCOUNTER — Encounter (HOSPITAL_COMMUNITY): Payer: Medicare Other

## 2022-05-01 ENCOUNTER — Ambulatory Visit: Payer: Medicare Other | Admitting: Vascular Surgery

## 2022-05-13 NOTE — Progress Notes (Unsigned)
Subjective:    Patient ID: Tamara Robertson, female    DOB: 1929-10-30, 89 y.o.   MRN: [id number]      HPI Mayla is here for No chief complaint on file.        Medications and allergies reviewed with patient and updated if appropriate.  Current Outpatient Medications on File Prior to Visit  Medication Sig Dispense Refill   acetaminophen (TYLENOL) 500 MG tablet Take 500 mg by mouth every 6 (six) hours as needed for moderate pain.     ammonium lactate (AMLACTIN) 12 % lotion 1 application Externally Twice a day     Biotin 2500 MCG CAPS Take 1 capsule by mouth daily.      calcium carbonate (TUMS - DOSED IN MG ELEMENTAL CALCIUM) 500 MG chewable tablet Chew 1 tablet by mouth daily as needed for indigestion or heartburn.     cholecalciferol (VITAMIN D3) 25 MCG (1000 UNIT) tablet Take 1,000 Units by mouth daily.     escitalopram (LEXAPRO) 5 MG tablet Take 1 tablet (5 mg total) by mouth daily. 90 tablet 3   famotidine (PEPCID) 20 MG tablet Take 1 tablet (20 mg total) by mouth 2 (two) times daily as needed for heartburn or indigestion. 180 tablet 0   Flaxseed MISC Take 5 mLs by mouth daily. Take 1 tsp daily     furosemide (LASIX) 20 MG tablet Take 1.5 tablets (30 mg total) by mouth daily. 135 tablet 3   Glucos-Chond-Hyal Ac-Ca Fructo (MOVE FREE JOINT HEALTH ADVANCE PO) Take 1 tablet by mouth daily.     lidocaine (LMX) 4 % cream Apply 1 application  topically daily as needed (inflammation).     lidocaine 4 % Place 1 patch onto the skin daily.     Magnesium Oxide 250 MG TABS Take 1 tablet by mouth.     metoprolol succinate (TOPROL-XL) 25 MG 24 hr tablet TAKE 1/2 (ONE-HALF) TABLET BY MOUTH ONCE DAILY WITH OR IMMEDIATELY FOLLOWING A MEAL 45 tablet 10   Multiple Vitamin (MULTIVITAMIN) tablet Take 1 tablet by mouth daily.       polyethylene glycol (MIRALAX / GLYCOLAX) packet Take 17 g by mouth daily as needed for moderate constipation.      predniSONE (DELTASONE) 2.5 MG tablet Take 1 tablet (2.5  mg total) by mouth every other day. 45 tablet 3   Probiotic Product (PROBIOTIC DAILY PO) Take 1 tablet by mouth daily with breakfast.     PSYLLIUM PO Take 1 tablet by mouth daily as needed (constipation).     triamcinolone cream (KENALOG) 0.1 % Apply 1 Application topically 2 (two) times daily as needed (rash).     trolamine salicylate (ASPERCREME) 10 % cream Apply 1 application topically as needed for muscle pain.     [DISCONTINUED] metoprolol succinate (TOPROL-XL) 25 MG 24 hr tablet Take 1 tablet (25 mg total) by mouth daily. 30 tablet 1   No current facility-administered medications on file prior to visit.    Review of Systems     Objective:  There were no vitals filed for this visit. BP Readings from Last 3 Encounters:  04/03/22 (!) 152/88  03/21/22 (!) 174/66  03/14/22 124/76   Wt Readings from Last 3 Encounters:  04/03/22 147 lb (66.7 kg)  03/21/22 148 lb 3.2 oz (67.2 kg)  03/14/22 148 lb (67.1 kg)   There is no height or weight on file to calculate BMI.    Physical Exam  Assessment & Plan:    See Problem List for Assessment and Plan of chronic medical problems.

## 2022-05-14 ENCOUNTER — Encounter: Payer: Self-pay | Admitting: Internal Medicine

## 2022-05-14 ENCOUNTER — Ambulatory Visit: Payer: Medicare Other | Admitting: Internal Medicine

## 2022-05-14 VITALS — BP 128/70 | HR 79 | Temp 97.9°F | Ht 60.0 in | Wt 147.8 lb

## 2022-05-14 DIAGNOSIS — I10 Essential (primary) hypertension: Secondary | ICD-10-CM | POA: Diagnosis not present

## 2022-05-14 DIAGNOSIS — J069 Acute upper respiratory infection, unspecified: Secondary | ICD-10-CM | POA: Diagnosis not present

## 2022-05-14 DIAGNOSIS — M8589 Other specified disorders of bone density and structure, multiple sites: Secondary | ICD-10-CM

## 2022-05-14 DIAGNOSIS — F419 Anxiety disorder, unspecified: Secondary | ICD-10-CM

## 2022-05-14 DIAGNOSIS — M79609 Pain in unspecified limb: Secondary | ICD-10-CM

## 2022-05-14 DIAGNOSIS — F32A Depression, unspecified: Secondary | ICD-10-CM

## 2022-05-14 NOTE — Assessment & Plan Note (Signed)
Chronic BP well controlled Continue metoprolol xl 12.5 mg daily

## 2022-05-14 NOTE — Assessment & Plan Note (Signed)
Chronic Controlled, stable Continue lexapro 5 mg daily

## 2022-05-14 NOTE — Assessment & Plan Note (Signed)
Acute  Intermittent tenderness in right posterior  - occurs when she stands Non tender on exam - no obvious cyst of abnormality on exam Possible cyst from OA, possible radiculopathy, less likely vascular  Sees vascular in 2 weeks - will be having LE [place] so we can see what that shows If persists should see ortho

## 2022-05-14 NOTE — Assessment & Plan Note (Signed)
Chronic Dexa due - ordered

## 2022-05-14 NOTE — Assessment & Plan Note (Signed)
Acute Resolving - symptoms improving and almost completely gone Lungs clear No treatment needed

## 2022-05-14 NOTE — Patient Instructions (Addendum)
        Medications changes include :   none      Return for Schedule DEXA-[place].

## 2022-05-21 ENCOUNTER — Telehealth: Payer: Self-pay

## 2022-05-21 ENCOUNTER — Other Ambulatory Visit: Payer: Self-pay

## 2022-05-21 MED ORDER — PREDNISONE 10 MG PO TABS: 50.0000 mg | ORAL_TABLET | Freq: Once | ORAL | 0 refills | Status: AC

## 2022-05-21 MED ORDER — DIPHENHYDRAMINE HCL 50 MG PO TABS: 50.0000 mg | ORAL_TABLET | Freq: Once | ORAL | 0 refills | Status: DC

## 2022-05-21 NOTE — Telephone Encounter (Signed)
Ok to order, Thanks.

## 2022-05-21 NOTE — Telephone Encounter (Signed)
Pt needs orders for pre-meds for CT with contrast in March 2024

## 2022-05-21 NOTE — Telephone Encounter (Signed)
Yes please

## 2022-05-21 NOTE — Telephone Encounter (Signed)
50 mg Prednisone po 13, 7 and 1 hour before the injection and 50 mg Diphenhydramine (Benadryl) IV/ PO with 1 hour of injection are the current protocol medications.  Are these the orders you would like?

## 2022-05-21 NOTE — Telephone Encounter (Signed)
Medications ordered and pt notified. Nothing further needed at this time.

## 2022-05-22 ENCOUNTER — Encounter: Payer: Self-pay | Admitting: Podiatry

## 2022-05-22 ENCOUNTER — Ambulatory Visit (INDEPENDENT_AMBULATORY_CARE_PROVIDER_SITE_OTHER): Payer: Medicare Other | Admitting: Podiatry

## 2022-05-22 DIAGNOSIS — I739 Peripheral vascular disease, unspecified: Secondary | ICD-10-CM

## 2022-05-22 DIAGNOSIS — M79674 Pain in right toe(s): Secondary | ICD-10-CM | POA: Diagnosis not present

## 2022-05-22 DIAGNOSIS — B351 Tinea unguium: Secondary | ICD-10-CM

## 2022-05-22 DIAGNOSIS — M79675 Pain in left toe(s): Secondary | ICD-10-CM

## 2022-05-22 NOTE — Progress Notes (Signed)
This patient returns to my office for at risk foot care.  This patient requires this care by a professional since this patient will be at risk due to having thrombocytopenia, peripheral neuropathy and pvd. This patient is unable to cut nails himself since the patient cannot reach his nails.These nails are painful walking and wearing shoes.  This patient presents for at risk foot care today.  General Appearance  Alert, conversant and in no acute stress.  Vascular  Dorsalis pedis and posterior tibial  pulses are  weakly palpable  bilaterally.  Capillary return is within normal limits  bilaterally. Temperature is within normal limits  bilaterally.  Neurologic  Senn-Weinstein monofilament wire test within normal limits  bilaterally. Muscle power within normal limits bilaterally.  Nails Thick disfigured discolored nails with subungual debris  from hallux to fifth toes bilaterally. No evidence of bacterial infection or drainage bilaterally.  Orthopedic  No limitations of motion  feet .  No crepitus or effusions noted.  No bony pathology or digital deformities noted.  Skin  normotropic skin with no porokeratosis noted bilaterally.  No signs of infections or ulcers noted.     Onychomycosis  Pain in right toes  Pain in left toes  Consent was obtained for treatment procedures.   Mechanical debridement of nails 1-5  bilaterally performed with a nail nipper.  Filed with dremel without incident.    Return office visit    8 weeks                  Told patient to return for periodic foot care and evaluation due to potential at risk complications.   Gardiner Barefoot DPM

## 2022-05-23 ENCOUNTER — Inpatient Hospital Stay: Admission: RE | Admit: 2022-05-23 | Payer: Medicare Other | Source: Ambulatory Visit

## 2022-05-24 ENCOUNTER — Telehealth: Payer: Self-pay | Admitting: Internal Medicine

## 2022-05-24 NOTE — Telephone Encounter (Signed)
Orders are good. Tried to call pt but got message that voicemail has not been set up yet. Sent pt a mychart message that the orders are in place and are good.

## 2022-05-24 NOTE — Telephone Encounter (Signed)
Inbound call from patient stating there were labs placed for her back in November and she is requesting a call to make sure they are still good for her to come and get them done. I advised her that I did see the orders were in, but I would just have the nurse to confirm with her. Please advise.

## 2022-05-25 ENCOUNTER — Ambulatory Visit (INDEPENDENT_AMBULATORY_CARE_PROVIDER_SITE_OTHER)
Admission: RE | Admit: 2022-05-25 | Discharge: 2022-05-25 | Disposition: A | Discharge status: Home / Self Care | Payer: Medicare Other | Source: Ambulatory Visit | Attending: Internal Medicine | Admitting: Internal Medicine

## 2022-05-25 ENCOUNTER — Other Ambulatory Visit (INDEPENDENT_AMBULATORY_CARE_PROVIDER_SITE_OTHER): Payer: Medicare Other

## 2022-05-25 DIAGNOSIS — K5731 Diverticulosis of large intestine without perforation or abscess with bleeding: Secondary | ICD-10-CM | POA: Diagnosis not present

## 2022-05-25 DIAGNOSIS — M8589 Other specified disorders of bone density and structure, multiple sites: Secondary | ICD-10-CM | POA: Diagnosis not present

## 2022-05-25 DIAGNOSIS — D500 Iron deficiency anemia secondary to blood loss (chronic): Secondary | ICD-10-CM | POA: Diagnosis not present

## 2022-05-25 LAB — CBC
HCT: 40 % (ref 36.0–46.0)
Hemoglobin: 13.1 g/dL (ref 12.0–15.0)
MCHC: 32.7 g/dL (ref 30.0–36.0)
MCV: 84.9 fl (ref 78.0–100.0)
Platelets: 174 10*3/uL (ref 150.0–400.0)
RBC: 4.71 Mil/uL (ref 3.87–5.11)
RDW: 15.2 % (ref 11.5–15.5)
WBC: 12.1 10*3/uL — ABNORMAL HIGH (ref 4.0–10.5)

## 2022-05-25 LAB — IBC + FERRITIN
Ferritin: 47.4 ng/mL (ref 10.0–291.0)
Iron: 92 ug/dL (ref 42–145)
Saturation Ratios: 28.6 % (ref 20.0–50.0)
TIBC: 322 ug/dL (ref 250.0–450.0)
Transferrin: 230 mg/dL (ref 212.0–360.0)

## 2022-05-28 ENCOUNTER — Other Ambulatory Visit: Payer: Self-pay

## 2022-05-28 ENCOUNTER — Encounter: Payer: Self-pay | Admitting: Internal Medicine

## 2022-05-28 DIAGNOSIS — D500 Iron deficiency anemia secondary to blood loss (chronic): Secondary | ICD-10-CM

## 2022-05-29 ENCOUNTER — Encounter (HOSPITAL_COMMUNITY): Payer: Medicare Other

## 2022-05-29 ENCOUNTER — Ambulatory Visit: Payer: Medicare Other | Admitting: Vascular Surgery

## 2022-05-30 ENCOUNTER — Ambulatory Visit: Payer: Medicare Other | Admitting: Internal Medicine

## 2022-06-04 ENCOUNTER — Encounter: Payer: Self-pay | Admitting: Internal Medicine

## 2022-06-04 NOTE — Patient Instructions (Signed)
      Blood work was ordered.   The lab is on the first floor.    Medications changes include :       A referral was ordered for XXX.     Someone will call you to schedule an appointment.    No follow-ups on file.

## 2022-06-04 NOTE — Progress Notes (Unsigned)
Subjective:    Patient ID: Tamara Robertson, female    DOB: 1930/04/13, 89 y.o.   MRN: [id number]     HPI Annaka is here for follow up of her chronic medical problems, including   Comparison: 2020 Clinical data: Pt is a 89 y.o. female without previous history of fracture.   Results:   Lumbar spine L1-L4 Femoral neck (FN)  T-score +1.0 RFN: -1.6 LFN: -1.9  Change in BMD from previous DXA test (%) Up 5.5%* Down 0.9%*  (*) statistically significant   Assessment: By the [facility] Criteria for diagnosis based on bone density, this patient has Low Bone Density       Medications and allergies reviewed with patient and updated if appropriate.  Current Outpatient Medications on File Prior to Visit  Medication Sig Dispense Refill   acetaminophen (TYLENOL) 500 MG tablet Take 500 mg by mouth every 6 (six) hours as needed for moderate pain.     ammonium lactate (AMLACTIN) 12 % lotion 1 application Externally Twice a day     Biotin 2500 MCG CAPS Take 1 capsule by mouth daily.      calcium carbonate (TUMS - DOSED IN MG ELEMENTAL CALCIUM) 500 MG chewable tablet Chew 1 tablet by mouth daily as needed for indigestion or heartburn.     cholecalciferol (VITAMIN D3) 25 MCG (1000 UNIT) tablet Take 1,000 Units by mouth daily.     diphenhydrAMINE (BENADRYL) 50 MG tablet Take 1 tablet (50 mg total) by mouth once for 1 dose. Please take within 1 hour of receiving CT contrast 1 tablet 0   escitalopram (LEXAPRO) 5 MG tablet Take 1 tablet (5 mg total) by mouth daily. 90 tablet 3   famotidine (PEPCID) 20 MG tablet Take 1 tablet (20 mg total) by mouth 2 (two) times daily as needed for heartburn or indigestion. 180 tablet 0   Flaxseed MISC Take 5 mLs by mouth daily. Take 1 tsp daily     furosemide (LASIX) 20 MG tablet Take 1.5 tablets (30 mg total) by mouth daily. 135 tablet 3   Glucos-Chond-Hyal Ac-Ca Fructo (MOVE FREE JOINT HEALTH ADVANCE PO) Take 1 tablet by mouth daily.     lidocaine (LMX) 4 % cream Apply 1  application  topically daily as needed (inflammation).     lidocaine 4 % Place 1 patch onto the skin daily.     Magnesium Oxide 250 MG TABS Take 1 tablet by mouth.     metoprolol succinate (TOPROL-XL) 25 MG 24 hr tablet TAKE 1/2 (ONE-HALF) TABLET BY MOUTH ONCE DAILY WITH OR IMMEDIATELY FOLLOWING A MEAL 45 tablet 10   Multiple Vitamin (MULTIVITAMIN) tablet Take 1 tablet by mouth daily.       polyethylene glycol (MIRALAX / GLYCOLAX) packet Take 17 g by mouth daily as needed for moderate constipation.      predniSONE (DELTASONE) 2.5 MG tablet Take 1 tablet (2.5 mg total) by mouth every other day. 45 tablet 3   Probiotic Product (PROBIOTIC DAILY PO) Take 1 tablet by mouth daily with breakfast.     PSYLLIUM PO Take 1 tablet by mouth daily as needed (constipation).     triamcinolone cream (KENALOG) 0.1 % Apply 1 Application topically 2 (two) times daily as needed (rash).     trolamine salicylate (ASPERCREME) 10 % cream Apply 1 application topically as needed for muscle pain.     [DISCONTINUED] metoprolol succinate (TOPROL-XL) 25 MG 24 hr tablet Take 1 tablet (25 mg total) by mouth daily.  30 tablet 1   No current facility-administered medications on file prior to visit.     Review of Systems     Objective:  There were no vitals filed for this visit. BP Readings from Last 3 Encounters:  05/14/22 128/70  04/03/22 (!) 152/88  03/21/22 (!) 174/66   Wt Readings from Last 3 Encounters:  05/14/22 147 lb 12.8 oz (67 kg)  04/03/22 147 lb (66.7 kg)  03/21/22 148 lb 3.2 oz (67.2 kg)   There is no height or weight on file to calculate BMI.    Physical Exam     Lab Results  Component Value Date   WBC 12.1 (H) 05/25/2022   HGB 13.1 05/25/2022   HCT 40.0 05/25/2022   PLT 174.0 05/25/2022   GLUCOSE 123 (H) 02/27/2022   CHOL 190 12/14/2020   TRIG 262.0 (H) 12/14/2020   HDL 46.00 12/14/2020   LDLDIRECT 118.0 12/14/2020   LDLCALC 107 (H) 05/23/2017   ALT 15 02/27/2022   AST 19 02/27/2022    NA 142 02/27/2022   K 4.4 02/27/2022   CL 104 02/27/2022   CREATININE 0.89 02/27/2022   BUN 15 02/27/2022   CO2 29 02/27/2022   TSH 1.984 01/06/2021   INR 1.1 02/17/2022   HGBA1C 5.8 12/14/2020     Assessment & Plan:    See Problem List for Assessment and Plan of chronic medical problems.

## 2022-06-05 ENCOUNTER — Ambulatory Visit (INDEPENDENT_AMBULATORY_CARE_PROVIDER_SITE_OTHER): Payer: Medicare Other | Admitting: Internal Medicine

## 2022-06-05 DIAGNOSIS — I10 Essential (primary) hypertension: Secondary | ICD-10-CM | POA: Diagnosis not present

## 2022-06-05 DIAGNOSIS — R42 Dizziness and giddiness: Secondary | ICD-10-CM

## 2022-06-05 DIAGNOSIS — M8589 Other specified disorders of bone density and structure, multiple sites: Secondary | ICD-10-CM | POA: Diagnosis not present

## 2022-06-05 NOTE — Assessment & Plan Note (Signed)
Chronic Intermittent Has episodes of intermittent wooziness, lightheadedness that she is experiencing today Has neck pain which could be associated with or separate Blood pressure and heart rate both well-controlled and normal Symptoms of wooziness and lightheadedness may be related to neck issues If no improvement consider PT

## 2022-06-05 NOTE — Progress Notes (Signed)
Virtual Visit via Video Note  I connected with Tamara Robertson on 06/05/22 at  3:00 PM EST by a video enabled telemedicine application and verified that I am speaking with the correct person using two identifiers.   I discussed the limitations of evaluation and management by telemedicine and the availability of in person appointments. The patient expressed understanding and agreed to proceed.  Present for the visit:  Myself, Dr Billey Gosling, [name].  The patient is currently at home and I am in the office.    No referring provider.    History of Present Illness: Tamara Robertson is here for follow up on her bone density   132/58  72 woke up today feeling lightheaded and whoozy. Neck stiffnesss.  This is not unusual-this occurs every once in a while, but this is why she did not come into the office.  No true spinning sensation.  Have had some SOB lately - sees Dr Percival Spanish.  SOB with activity.  She gets SOB when laying down to go to sleep or sitting in her recliner.  SOB lasts for 5-10 minutes.   Bone density- Comparison: 2020 Clinical data: Pt is a 89 y.o. female without previous history of fracture.   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 to be close to kids   Social Determinants of Health   Financial Resource Strain: [place]  (08/30/2021)   Overall Financial Resource Strain (CARDIA)    Difficulty of Paying Living Expenses: Not hard at all  Food Insecurity: No Food Insecurity (08/30/2021)   Hunger Vital Sign    Worried About Running Out of Food in the Last Year: Never true    [place] in the Last Year: Never true  Transportation Needs: No Transportation Needs (08/30/2021)   PRAPARE - Hydrologist (Medical): No    Lack of Transportation (Non-Medical): No  Physical Activity: Inactive (08/05/2020)   Exercise Vital Sign    Days of Exercise per Week: 0 days    Minutes of Exercise per Session: 0 min  Stress: No Stress Concern Present (08/30/2021)   [place]    Feeling of Stress : Not at all  Social Connections: Socially Isolated (08/30/2021)   Social Connection and Isolation Panel [NHANES]    Frequency of Communication with Friends  and Family: More than three times a week    Frequency of Social Gatherings with Friends and Family: Once a week    Attends Religious Services: Never    Marine scientist or Organizations: No    Attends Archivist Meetings: Never    Marital Status: Widowed     Observations/Objective: Appears well in NAD   Assessment and Plan:  See Problem List for Assessment and Plan of chronic medical problems.   Follow Up Instructions:    I discussed the assessment and treatment plan with the patient. The patient was provided an opportunity to ask questions and all were answered. The patient agreed with the plan and demonstrated an understanding of the instructions.   The patient was  advised to call back or seek an in-person evaluation if the symptoms worsen or if the condition fails to improve as anticipated.    Binnie Rail, MD

## 2022-06-05 NOTE — Assessment & Plan Note (Addendum)
Chronic Has osteopenia with high FRAX which is an equivalent to osteoporosis Reviewed her bone density with her today Discussed increased risk of fracture Discussed fall prevention Advised being as active as possible and continuing to take calcium and vitamin D Discussed options to help treat her bone density including Fosamax, Reclast and Prolia She does have GERD so I do not think she is the best candidate for Fosamax We both agree Reclast would be a good option Start Reclast infusion yearly infusions for 3 years Repeat bone density in 2 years

## 2022-06-05 NOTE — Assessment & Plan Note (Signed)
Chronic Blood pressure is well-controlled She checks at home regularly and today it is 132/58 with a heart rate of 70 Not likely related to her current symptoms of wooziness, lightheadedness Continue current medications-metoprolol XL 12.5 mg daily

## 2022-06-06 ENCOUNTER — Encounter: Payer: Self-pay | Admitting: Internal Medicine

## 2022-06-07 ENCOUNTER — Telehealth: Payer: Self-pay

## 2022-06-11 NOTE — Progress Notes (Unsigned)
Cardiology Office Note   Date:  06/12/2022   ID:  Tamara Robertson, Tamara Robertson Oct 24, 1929, MRN [id number]   PCP:  Pincus Sanes, MD  Cardiologist:   Rollene Rotunda, MD   Chief Complaint  Patient presents with   Atrial Fibrillation     History of Present Illness: Tamara Robertson is a 89 y.o. female who presents for evaluation of PT.  She has had non obstructive CAD.    She had remote PTA in [place], and chronic LE edema.   She was in the hospital with new onset atrial fibrillation with rapid ventricular response.  He had demand ischemia with elevated troponins.  She was treated with IV diltiazem and also antibiotics for possible pneumonia.  He converted to spontaneous sinus rhythm.  He had some sinus bradycardia with first-degree AV block and some 2.5-second pauses.  She was sent home on a low-dose beta-blocker and Eliquis.  Her echo demonstrated EF of 60 to 65% with normal wall motion.  She did have some dizziness.  She was to receive a 30-day event monitor.  Because of dizziness we did move her beta-blocker to the evening.    She wore a monitor and there was no evidence of atrial fib.   She came off of DOAC because of GI bleeding and rash on Eliquis.    Since I last saw her she had some shortness of breath.  She might get this when she goes to sit down or at rest.  She really does not notice it as much during the day.  She walks to the grocery store and does some department store shopping and does not necessarily bring on the shortness of breath.  She is not really describing PND or orthopnea.  She is not having any palpitations, presyncope or syncope.  She is not having any new chest pain.  She does not notice any fibrillation.   Past Medical History:  Diagnosis Date   Achilles tendonitis    Acute lower GI bleeding 2023   Anxiety    Aortic atherosclerosis (HCC)    CAD (coronary artery disease)    RCA 40% stenosis cath 01/2011   Diverticular stricture [facility]) 2006   [facility]   DIVERTICULITIS, HX OF    Diverticulosis    DYSLIPIDEMIA    Elevated LFTs    GERD    Glaucoma    Hepatic steatosis    HOH (hard of hearing)    Immune thrombocytopenic purpura (HCC)    chronic - baseline 80-100K, on pred   Irritable bowel syndrome    Left ovarian cyst dx 01/2013 CT   working with gyn, ?malignant - elevated tumor marker OVA1   OSTEOARTHRITIS, KNEE, RIGHT    OSTEOPENIA    OVERACTIVE BLADDER    Plantar fasciitis    Prolapse of female bladder, acquired 11/2017   Torn rotator cuff    right shoulder   URINARY INCONTINENCE     Past Surgical History:  Procedure Laterality Date   ABDOMINAL HYSTERECTOMY  1963   APPENDECTOMY  1956   BIOPSY  02/18/2022   Procedure: BIOPSY;  Surgeon: Lemar Lofty., MD;  Location: [facility];  Service: Gastroenterology;;   CARDIAC CATHETERIZATION     CATARACT EXTRACTION, BILATERAL  10/2010   CHOLECYSTECTOMY N/A 09/16/2014   Procedure: LAPAROSCOPIC CHOLECYSTECTOMY ;  Surgeon: Emelia Loron, MD;  Location: [facility];  Service: General;  Laterality: N/A;   COLONOSCOPY N/A 02/19/2022   Procedure: COLONOSCOPY;  Surgeon: Sherrilyn Rist, MD;  Location: [facility];  Service: Gastroenterology;  Laterality: N/A;   ESOPHAGOGASTRODUODENOSCOPY N/A 02/18/2022   Procedure: ESOPHAGOGASTRODUODENOSCOPY (EGD);  Surgeon: Lemar Lofty., MD;  Location: [facility];  Service: Gastroenterology;  Laterality: N/A;   FLEXIBLE SIGMOIDOSCOPY N/A 02/18/2022   Procedure: FLEXIBLE SIGMOIDOSCOPY;  Surgeon: Meridee Score Netty Starring., MD;  Location: [facility];  Service: Gastroenterology;  Laterality: N/A;   HEMOSTASIS CLIP PLACEMENT  02/19/2022   Procedure: HEMOSTASIS CLIP PLACEMENT;  Surgeon: Sherrilyn Rist, MD;  Location: [facility];  Service: Gastroenterology;;   KNEE ARTHROSCOPY Right    L pop PTA  10/2009   stent   LAPAROSCOPIC SIGMOID COLECTOMY  10/2005   SPLENECTOMY  1954   VARICOSE VEIN SURGERY Right 1962      Current Outpatient Medications  Medication Sig Dispense Refill   acetaminophen (TYLENOL) 500 MG tablet Take 500 mg by mouth every 6 (six) hours as needed for moderate pain.     ammonium lactate (AMLACTIN) 12 % lotion 1 application Externally Twice a day     Biotin 2500 MCG CAPS Take 1 capsule by mouth daily.      calcium carbonate (TUMS - DOSED IN MG ELEMENTAL CALCIUM) 500 MG chewable tablet Chew 1 tablet by mouth daily as needed for indigestion or heartburn.     cholecalciferol (VITAMIN D3) 25 MCG (1000 UNIT) tablet Take 1,000 Units by mouth daily.     escitalopram (LEXAPRO) 5 MG tablet Take 1 tablet (5 mg total) by mouth daily. 90 tablet 3   famotidine (PEPCID) 20 MG tablet Take 1 tablet (20 mg total) by mouth 2 (two) times daily as needed for heartburn or indigestion. 180 tablet 0   Flaxseed MISC Take 5 mLs by mouth daily. Take 1 tsp daily     furosemide (LASIX) 20 MG tablet Take 1.5 tablets (30 mg total) by mouth daily. 135 tablet 3   Glucos-Chond-Hyal Ac-Ca Fructo (MOVE FREE JOINT HEALTH ADVANCE PO) Take 1 tablet by mouth daily.     lidocaine (LMX) 4 % cream Apply 1 application  topically daily as needed (inflammation).     lidocaine 4 % Place 1 patch onto the skin daily.     Magnesium Oxide 250 MG TABS Take 1 tablet by mouth.     metoprolol succinate (TOPROL-XL) 25 MG 24 hr tablet TAKE 1/2 (ONE-HALF) TABLET BY MOUTH ONCE DAILY WITH OR IMMEDIATELY FOLLOWING A MEAL 45 tablet 10   Multiple Vitamin (MULTIVITAMIN) tablet Take 1 tablet by mouth daily.       polyethylene glycol (MIRALAX / GLYCOLAX) packet Take 17 g by mouth daily as needed for moderate constipation.      predniSONE (DELTASONE) 2.5 MG tablet Take 1 tablet (2.5 mg total) by mouth every other day. 45 tablet 3   Probiotic Product (PROBIOTIC DAILY PO) Take 1 tablet by mouth daily with breakfast.     PSYLLIUM PO Take 1 tablet by mouth daily as needed (constipation).     triamcinolone cream (KENALOG) 0.1 % Apply 1 Application  topically 2 (two) times daily as needed (rash).     trolamine salicylate (ASPERCREME) 10 % cream Apply 1 application topically as needed for muscle pain.     diphenhydrAMINE (BENADRYL) 50 MG tablet Take 1 tablet (50 mg total) by mouth once for 1 dose. Please take within 1 hour of receiving CT contrast 1 tablet 0   No current facility-administered medications for this visit.    Allergies:   Aspirin, Sulfa antibiotics, Contrast  media [iodinated contrast media], Shellfish-derived products, Apixaban, Fluoxetine, Sulfamethoxazole-trimethoprim, Celebrex [celecoxib], Celexa [citalopram], and Statins    ROS:  Please see the history of present illness.   Otherwise, review of systems are positive for none .   All other systems are reviewed and negative.     PHYSICAL EXAM: VS:  BP 138/70   Pulse 85   Ht 5' 0.5" (1.537 m)   Wt 150 lb 6.4 oz (68.2 kg)   SpO2 97%   BMI 28.89 kg/m  , BMI Body mass index is 28.89 kg/m. GENERAL:  Well appearing NECK:  No jugular venous distention, waveform within normal limits, carotid upstroke brisk and symmetric, no bruits, no thyromegaly LUNGS:  Clear to auscultation bilaterally CHEST:  Unremarkable HEART:  PMI not displaced or sustained,S1 and S2 within normal limits, no S3, no S4, no clicks, no rubs, 2 out of 6 apical systolic murmur radiating at the aortic outflow tract, no diastolic murmurs ABD:  Flat, positive bowel sounds normal in frequency in pitch, no bruits, no rebound, no guarding, no midline pulsatile mass, no hepatomegaly, no splenomegaly EXT:  2 plus pulses throughout, no edema, no cyanosis no clubbing   EKG:  EKG is not ordered today.   Recent Labs: 02/17/2022: Magnesium 2.3 02/27/2022: ALT 15; BUN 15; Creatinine 0.89; Potassium 4.4; Sodium 142 05/25/2022: Hemoglobin 13.1; Platelets 174.0    Lipid Panel    Component Value Date/Time   CHOL 190 12/14/2020 1616   CHOL 215 (H) 06/22/2014 0825   TRIG 262.0 (H) 12/14/2020 1616   TRIG 161 (H)  06/22/2014 0825   TRIG 228 02/20/2010 0000   HDL 46.00 12/14/2020 1616   HDL 51 06/22/2014 0825   CHOLHDL 4 12/14/2020 1616   VLDL 52.4 (H) 12/14/2020 1616   LDLCALC 107 (H) 05/23/2017 1151   LDLCALC 132 (H) 06/22/2014 0825   LDLDIRECT 118.0 12/14/2020 1616      Wt Readings from Last 3 Encounters:  06/12/22 150 lb 6.4 oz (68.2 kg)  05/14/22 147 lb 12.8 oz (67 kg)  04/03/22 147 lb (66.7 kg)      Other studies Reviewed: Additional studies/ records that were reviewed today include:  Labs Review of the above records demonstrates: See elsewhere   ASSESSMENT AND PLAN:   Paroxysmal Atrial Fibrillation She has not noticed any paroxysms.  She does not tolerate anticoagulation.  No change in therapy.   Mild to Moderate Aortic Regurgitation/Mild to Moderate Mitral Regurgitation: Given her shortness of breath I will check an echocardiogram.  She is going to take Lasix 30 mg a day instead of 20.  She had been taking 30 mg back to the 20 when her leg swelling improved.  Hypertension The blood pressure is at target.  No change in therapy.    Leg swelling: This is improved.  She will be on the meds as listed.    The current medicines are reviewed at length with the patient today.  The patient does not have concerns regarding medicines.  The following changes have been made:    None  Labs/ tests ordered today include:    Orders Placed This Encounter  Procedures   ECHOCARDIOGRAM COMPLETE     Disposition:   FU with me in 6 months.    Signed, Rollene Rotunda, MD  06/12/2022 5:02 PM    [facility]

## 2022-06-12 ENCOUNTER — Ambulatory Visit: Payer: Medicare Other | Attending: Cardiology | Admitting: Cardiology

## 2022-06-12 ENCOUNTER — Encounter: Payer: Self-pay | Admitting: Cardiology

## 2022-06-12 VITALS — BP 138/70 | HR 85 | Ht 60.5 in | Wt 150.4 lb

## 2022-06-12 DIAGNOSIS — I480 Paroxysmal atrial fibrillation: Secondary | ICD-10-CM | POA: Diagnosis not present

## 2022-06-12 DIAGNOSIS — I351 Nonrheumatic aortic (valve) insufficiency: Secondary | ICD-10-CM | POA: Diagnosis not present

## 2022-06-12 DIAGNOSIS — I340 Nonrheumatic mitral (valve) insufficiency: Secondary | ICD-10-CM | POA: Diagnosis not present

## 2022-06-12 NOTE — Patient Instructions (Signed)
  Testing/Procedures: Your physician has requested that you have an echocardiogram. Echocardiography is a painless test that uses sound waves to create images of your heart. It provides your doctor with information about the size and shape of your heart and how well your heart's chambers and valves are working. This procedure takes approximately one hour. There are no restrictions for this procedure. Please do NOT wear cologne, perfume, aftershave, or lotions (deodorant is allowed). Please arrive 15 minutes prior to your appointment time. [facility], you and your health needs are our priority.  As part of our continuing mission to provide you with exceptional heart care, we have created designated Provider Care Teams.  These Care Teams include your primary Cardiologist (physician) and Advanced Practice Providers (APPs -  Physician Assistants and Nurse Practitioners) who all work together to provide you with the care you need, when you need it.  We recommend signing up for the patient portal called "MyChart".  Sign up information is provided on this After Visit Summary.  MyChart is used to connect with patients for Virtual Visits (Telemedicine).  Patients are able to view lab/test results, encounter notes, upcoming appointments, etc.  Non-urgent messages can be sent to your provider as well.   To learn more about what you can do with MyChart, go to [email or web address].    Your next appointment:   6 month(s)  Provider:   [name], MD

## 2022-06-13 NOTE — Telephone Encounter (Signed)
Benefits checked and order faxed to [facility].

## 2022-06-14 NOTE — Telephone Encounter (Signed)
Called infusion center today and message left to contact me so I could get patient scheduled for Reclast.

## 2022-06-15 ENCOUNTER — Other Ambulatory Visit: Payer: Self-pay

## 2022-06-15 DIAGNOSIS — K921 Melena: Secondary | ICD-10-CM

## 2022-06-15 NOTE — Telephone Encounter (Signed)
Appointment made for patient and patient contacted.  She will call them back to reschedule because she was not able to make date for 2/19.

## 2022-06-18 NOTE — Progress Notes (Unsigned)
[facility] [place] PROGRESS NOTE  ASSESSMENT / PLAN: Tamara Robertson is a 89 y.o. female with numbness and weakness of bilateral lower extremities. No evidence of significant peripheral arterial disease. Suspect spinal stenosis, radiculopathy, etc. As cause. Will see her again in 1 year with ABI.   SUBJECTIVE: Returns to clinic for evaluation.  She reports occasional numbness and weakness in the lower extremities. She does not describe claudication or rest pain symptoms. No ulcers.   OBJECTIVE: There were no vitals taken for this visit. Well-appearing in no acute distress Regular rate and rhythm Unlabored breathing Right lower extremity erythematous and tender to the touch over the medial malleolus, much improved from previous.  1+ dorsalis pedis pulse bilaterally.     Latest Ref Rng & Units 05/25/2022    2:15 PM 02/27/2022    2:28 PM 02/20/2022   12:28 PM  CBC  WBC 4.0 - 10.5 K/uL 12.1  10.4  13.1   Hemoglobin 12.0 - 15.0 g/dL 13.1  11.6  11.2   Hematocrit 36.0 - 46.0 % 40.0  35.2  33.9   Platelets 150.0 - 400.0 K/uL 174.0  254  154         Latest Ref Rng & Units 02/27/2022    2:28 PM 02/20/2022    3:07 AM 02/19/2022    7:05 AM  CMP  Glucose 70 - 99 mg/dL 123  112  95   BUN 8 - 23 mg/dL 15  15  14   $ Creatinine 0.44 - 1.00 mg/dL 0.89  0.74  0.89   Sodium 135 - 145 mmol/L 142  140  142   Potassium 3.5 - 5.1 mmol/L 4.4  4.0  3.4   Chloride 98 - 111 mmol/L 104  110  111   CO2 22 - 32 mmol/L 29  24  23   $ Calcium 8.9 - 10.3 mg/dL 9.4  8.6  8.7   Total Protein 6.5 - 8.1 g/dL 6.5     Total Bilirubin 0.3 - 1.2 mg/dL 0.3     Alkaline Phos 38 - 126 U/L 63     AST 15 - 41 U/L 19     ALT 0 - 44 U/L 15        +-------+-----------+-----------+------------+------------+  ABI/TBIToday's ABIToday's TBIPrevious ABIPrevious TBI  +-------+-----------+-----------+------------+------------+  Right 1.05       0.37       0.73        0.25           +-------+-----------+-----------+------------+------------+  Left  1.00       0.54       0.76        0.40          +-------+-----------+-----------+------------+------------+     Yevonne Aline. Stanford Breed, MD [facility] Phone Number: [phone] 06/18/2022 6:01 PM

## 2022-06-19 ENCOUNTER — Encounter: Payer: Self-pay | Admitting: Vascular Surgery

## 2022-06-19 ENCOUNTER — Ambulatory Visit (INDEPENDENT_AMBULATORY_CARE_PROVIDER_SITE_OTHER)
Admission: RE | Admit: 2022-06-19 | Discharge: 2022-06-19 | Disposition: A | Discharge status: Home / Self Care | Payer: Medicare Other | Source: Ambulatory Visit | Attending: Vascular Surgery | Admitting: Vascular Surgery

## 2022-06-19 ENCOUNTER — Ambulatory Visit (HOSPITAL_COMMUNITY)
Admission: RE | Admit: 2022-06-19 | Discharge: 2022-06-19 | Disposition: A | Discharge status: Home / Self Care | Payer: Medicare Other | Source: Ambulatory Visit | Attending: Vascular Surgery | Admitting: Vascular Surgery

## 2022-06-19 ENCOUNTER — Ambulatory Visit: Payer: Medicare Other | Admitting: Vascular Surgery

## 2022-06-19 VITALS — BP 158/68 | HR 67 | Temp 97.9°F | Resp 20 | Ht 60.0 in | Wt 151.0 lb

## 2022-06-19 DIAGNOSIS — I872 Venous insufficiency (chronic) (peripheral): Secondary | ICD-10-CM | POA: Insufficient documentation

## 2022-06-19 DIAGNOSIS — I739 Peripheral vascular disease, unspecified: Secondary | ICD-10-CM

## 2022-06-19 DIAGNOSIS — I83892 Varicose veins of left lower extremities with other complications: Secondary | ICD-10-CM | POA: Insufficient documentation

## 2022-06-20 LAB — VAS US ABI WITH/WO TBI
Left ABI: 1
Right ABI: 1.05

## 2022-06-25 ENCOUNTER — Other Ambulatory Visit (INDEPENDENT_AMBULATORY_CARE_PROVIDER_SITE_OTHER): Payer: Medicare Other

## 2022-06-25 ENCOUNTER — Encounter (HOSPITAL_COMMUNITY): Payer: Medicare Other

## 2022-06-25 DIAGNOSIS — K921 Melena: Secondary | ICD-10-CM

## 2022-06-25 LAB — COMPREHENSIVE METABOLIC PANEL
ALT: 15 U/L (ref 0–35)
AST: 22 U/L (ref 0–37)
Albumin: 4.1 g/dL (ref 3.5–5.2)
Alkaline Phosphatase: 73 U/L (ref 39–117)
BUN: 20 mg/dL (ref 6–23)
CO2: 33 mEq/L — ABNORMAL HIGH (ref 19–32)
Calcium: 9.8 mg/dL (ref 8.4–10.5)
Chloride: 101 mEq/L (ref 96–112)
Creatinine, Ser: 0.93 mg/dL (ref 0.40–1.20)
GFR: 54 mL/min — ABNORMAL LOW (ref >60.00)
Glucose, Bld: 120 mg/dL — ABNORMAL HIGH (ref 70–99)
Potassium: 4.3 mEq/L (ref 3.5–5.1)
Sodium: 141 mEq/L (ref 135–145)
Total Bilirubin: 0.3 mg/dL (ref 0.2–1.2)
Total Protein: 7.2 g/dL (ref 6.0–8.3)

## 2022-06-26 ENCOUNTER — Other Ambulatory Visit: Payer: Self-pay

## 2022-06-26 DIAGNOSIS — D693 Immune thrombocytopenic purpura: Secondary | ICD-10-CM

## 2022-06-28 ENCOUNTER — Other Ambulatory Visit (HOSPITAL_COMMUNITY): Payer: Self-pay | Admitting: *Deleted

## 2022-06-29 ENCOUNTER — Inpatient Hospital Stay (HOSPITAL_COMMUNITY): Admission: RE | Admit: 2022-06-29 | Payer: Medicare Other | Source: Ambulatory Visit

## 2022-07-03 ENCOUNTER — Ambulatory Visit (HOSPITAL_COMMUNITY): Payer: Medicare Other | Attending: Cardiology

## 2022-07-03 ENCOUNTER — Encounter (HOSPITAL_COMMUNITY): Payer: Medicare Other

## 2022-07-03 DIAGNOSIS — I351 Nonrheumatic aortic (valve) insufficiency: Secondary | ICD-10-CM | POA: Insufficient documentation

## 2022-07-03 DIAGNOSIS — I340 Nonrheumatic mitral (valve) insufficiency: Secondary | ICD-10-CM

## 2022-07-03 LAB — ECHOCARDIOGRAM COMPLETE
AR max vel: 1.26 cm2
AV Area VTI: 1.26 cm2
AV Area mean vel: 1.21 cm2
AV Mean grad: 14.7 mmHg
AV Peak grad: 25.5 mmHg
Ao pk vel: 2.52 m/s
Area-P 1/2: 3.08 cm2
MV M vel: 3.95 m/s
MV Peak grad: 62.4 mmHg
P 1/2 time: 320 msec
S' Lateral: 1.7 cm

## 2022-07-06 ENCOUNTER — Other Ambulatory Visit: Payer: Self-pay

## 2022-07-06 DIAGNOSIS — R911 Solitary pulmonary nodule: Secondary | ICD-10-CM

## 2022-07-06 NOTE — Progress Notes (Signed)
Super D CT order placed.

## 2022-07-09 ENCOUNTER — Encounter (HOSPITAL_COMMUNITY): Payer: Self-pay

## 2022-07-09 ENCOUNTER — Ambulatory Visit (HOSPITAL_COMMUNITY)
Admission: RE | Admit: 2022-07-09 | Discharge: 2022-07-09 | Disposition: A | Discharge status: Home / Self Care | Payer: Medicare Other | Source: Ambulatory Visit | Attending: Emergency Medicine | Admitting: Emergency Medicine

## 2022-07-09 DIAGNOSIS — R911 Solitary pulmonary nodule: Secondary | ICD-10-CM | POA: Insufficient documentation

## 2022-07-12 ENCOUNTER — Ambulatory Visit (HOSPITAL_COMMUNITY)
Admission: RE | Admit: 2022-07-12 | Discharge: 2022-07-12 | Disposition: A | Discharge status: Home / Self Care | Payer: Medicare Other | Source: Ambulatory Visit | Attending: Internal Medicine | Admitting: Internal Medicine

## 2022-07-12 DIAGNOSIS — R911 Solitary pulmonary nodule: Secondary | ICD-10-CM | POA: Insufficient documentation

## 2022-07-12 MED ORDER — ZOLEDRONIC ACID 5 MG/100ML IV SOLN
INTRAVENOUS | Status: AC
  Filled 2022-07-12: qty 100

## 2022-07-12 MED ORDER — ZOLEDRONIC ACID 5 MG/100ML IV SOLN
5.0000 mg | Freq: Once | INTRAVENOUS | Status: AC
  Administered 2022-07-12: 5 mg via INTRAVENOUS

## 2022-07-18 ENCOUNTER — Encounter: Payer: Medicare Other | Admitting: Internal Medicine

## 2022-07-19 ENCOUNTER — Other Ambulatory Visit: Payer: Self-pay | Admitting: Internal Medicine

## 2022-07-24 ENCOUNTER — Encounter: Payer: Self-pay | Admitting: Internal Medicine

## 2022-07-24 ENCOUNTER — Ambulatory Visit: Payer: Medicare Other | Admitting: Internal Medicine

## 2022-07-24 VITALS — BP 120/60 | HR 90 | Wt 150.0 lb

## 2022-07-24 DIAGNOSIS — K219 Gastro-esophageal reflux disease without esophagitis: Secondary | ICD-10-CM

## 2022-07-24 DIAGNOSIS — K5909 Other constipation: Secondary | ICD-10-CM

## 2022-07-24 DIAGNOSIS — D509 Iron deficiency anemia, unspecified: Secondary | ICD-10-CM | POA: Diagnosis not present

## 2022-07-24 NOTE — Patient Instructions (Addendum)
Stay on your Pepcid twice daily   A high fiber diet with plenty of fluids (up to 8 glasses of water daily) is suggested to relieve these symptoms.  Miralax,2  tablespoon once or twice daily can be used to keep bowels regular if needed. Also try psyllium over the counter for fiber as well.  ______________________________________________________  If your blood pressure at your visit was 140/90 or greater, please contact your primary care physician to follow up on this.  _______________________________________________________  If you are age 97 or older, your body mass index should be between 23-30. Your Body mass index is 4,218.46 kg/m. If this is out of the aforementioned range listed, please consider follow up with your Primary Care Provider.  If you are age 54 or younger, your body mass index should be between 19-25. Your Body mass index is 4,218.46 kg/m. If this is out of the aformentioned range listed, please consider follow up with your Primary Care Provider.   ________________________________________________________  The [facility] [facility] providers would like to encourage you to use [facility] to communicate with providers for non-urgent requests or questions.  Due to long hold times on the telephone, sending your provider a message by [facility] may be a faster and more efficient way to get a response.  Please allow 48 business hours for a response.  Please remember that this is for non-urgent requests.  _______________________________________________________ .

## 2022-07-24 NOTE — Progress Notes (Signed)
Subjective:    Patient ID: Tamara Robertson, female    DOB: 03/04/1930, 89 y.o.   MRN: [id number]  HPI Tamara Robertson is a 89 year old female with a recent history of lower GI bleed secondary to diverticulosis, history of GERD, IBS with chronic constipation, history of complicated diverticular disease requiring sigmoidectomy in 2007 for stricture, prior SBO felt secondary to adhesive disease, peripheral vascular disease, history of ITP, prior cholecystectomy and splenectomy, recent diagnosis of lung cancer on observation who is seen for follow-up.   She was last here on 03/14/2022 and she is here alone today.  She received 1 dose of IV Feraheme and reported tremendous increase in energy levels and stamina.  She was doing very well on Pepcid 20 mg twice daily without heartburn or dysphagia.  Bowel movements were regular though she recently received IV bisphosphonate therapy and she has had return of mild constipation alternating with loose stool.  She uses MiraLAX daily to every other day.  After her bisphosphonate infusion she had cold chills and tremor which improved but she has had some bone and joint pain since then.  She will talk to Dr. Lawerance Bach about this tomorrow.  She does not want to take another dose.  No further GI bleeding, blood in stool or melena.    Review of Systems As per HPI, otherwise negative    Objective:   Physical Exam BP 120/60   Pulse 90   Wt 150 lb (68 kg)   BMI 4218.46 kg/m  Gen: awake, alert, NAD HEENT: anicteric  CV: RRR soft systolic ejection murmur Pulm: CTA b/l Abd: soft, NT/ND, +BS throughout Ext: no c/c/e Neuro: nonfocal     Latest Ref Rng & Units 05/25/2022    2:15 PM 02/27/2022    2:28 PM 02/20/2022   12:28 PM  CBC  WBC 4.0 - 10.5 K/uL 12.1  10.4  13.1   Hemoglobin 12.0 - 15.0 g/dL 56.3  87.5  64.3   Hematocrit 36.0 - 46.0 % 40.0  35.2  33.9   Platelets 150.0 - 400.0 K/uL 174.0  254  154    Iron/TIBC/Ferritin/ %Sat    Component Value Date/Time   IRON  92 05/25/2022 1415   TIBC 322.0 05/25/2022 1415   FERRITIN 47.4 05/25/2022 1415   IRONPCTSAT 28.6 05/25/2022 1415        Assessment & Plan:   89 year old female with a recent history of lower GI bleed secondary to diverticulosis, history of GERD, IBS with chronic constipation, history of complicated diverticular disease requiring sigmoidectomy in 2007 for stricture, prior SBO felt secondary to adhesive disease, peripheral vascular disease, history of ITP, prior cholecystectomy and splenectomy, recent diagnosis of lung cancer on observation who is seen for follow-up.  IDA/prior diverticular hemorrhage --she responded very well to IV iron.  Iron studies have improved and normalized as of January 2024.  No recurrent GI bleeding --Monitor blood counts and iron studies routinely  2.  Moderate constipation --worse with oral iron.  She can use MiraLAX 17 g daily to every other day as needed.  I did encourage her to reinstitute psyllium daily  3.  GERD/dyspepsia --currently well-controlled on famotidine 20 mg twice daily.  4.  Lung cancer --on observation  with Dr. Delton Coombes.  She will follow-up with him next week.  She would like to see me in 4 5 months and we can try to get her an appointment before she leaves today  20 minutes total spent today including patient facing time, coordination  of care, reviewing medical history/procedures/pertinent radiology studies, and documentation of the encounter.

## 2022-07-24 NOTE — Patient Instructions (Addendum)
Blood work was ordered.   The lab is on the first floor.    Medications changes include :  none      Return in about 6 months (around 01/25/2023) for follow up.    Health Maintenance, Female Adopting a healthy lifestyle and getting preventive care are important in promoting health and wellness. Ask your health care provider about: The right schedule for you to have regular tests and exams. Things you can do on your own to prevent diseases and keep yourself healthy. What should I know about diet, weight, and exercise? Eat a healthy diet  Eat a diet that includes plenty of vegetables, fruits, low-fat dairy products, and lean protein. Do not eat a lot of foods that are high in solid fats, added sugars, or sodium. Maintain a healthy weight Body mass index (BMI) is used to identify weight problems. It estimates body fat based on height and weight. Your health care provider can help determine your BMI and help you achieve or maintain a healthy weight. Get regular exercise Get regular exercise. This is one of the most important things you can do for your health. Most adults should: Exercise for at least 150 minutes each week. The exercise should increase your heart rate and make you sweat (moderate-intensity exercise). Do strengthening exercises at least twice a week. This is in addition to the moderate-intensity exercise. Spend less time sitting. Even light physical activity can be beneficial. Watch cholesterol and blood lipids Have your blood tested for lipids and cholesterol at 87 years of age, then have this test every 5 years. Have your cholesterol levels checked more often if: Your lipid or cholesterol levels are high. You are older than 87 years of age. You are at high risk for heart disease. What should I know about cancer screening? Depending on your health history and family history, you may need to have cancer screening at various ages. This may include screening  for: Breast cancer. Cervical cancer. Colorectal cancer. Skin cancer. Lung cancer. What should I know about heart disease, diabetes, and high blood pressure? Blood pressure and heart disease High blood pressure causes heart disease and increases the risk of stroke. This is more likely to develop in people who have high blood pressure readings or are overweight. Have your blood pressure checked: Every 3-5 years if you are 9-33 years of age. Every year if you are 39 years old or older. Diabetes Have regular diabetes screenings. This checks your fasting blood sugar level. Have the screening done: Once every three years after age 25 if you are at a normal weight and have a low risk for diabetes. More often and at a younger age if you are overweight or have a high risk for diabetes. What should I know about preventing infection? Hepatitis B If you have a higher risk for hepatitis B, you should be screened for this virus. Talk with your health care provider to find out if you are at risk for hepatitis B infection. Hepatitis C Testing is recommended for: Everyone born from [birth year] through 1963. Anyone with known risk factors for hepatitis C. Sexually transmitted infections (STIs) Get screened for STIs, including gonorrhea and chlamydia, if: You are sexually active and are younger than 87 years of age. You are older than 87 years of age and your health care provider tells you that you are at risk for this type of infection. Your sexual activity has changed since you were last screened, and you are  at increased risk for chlamydia or gonorrhea. Ask your health care provider if you are at risk. Ask your health care provider about whether you are at high risk for HIV. Your health care provider may recommend a prescription medicine to help prevent HIV infection. If you choose to take medicine to prevent HIV, you should first get tested for HIV. You should then be tested every 3 months for as long as you  are taking the medicine. Pregnancy If you are about to stop having your period (premenopausal) and you may become pregnant, seek counseling before you get pregnant. Take 400 to 800 micrograms (mcg) of folic acid every day if you become pregnant. Ask for birth control (contraception) if you want to prevent pregnancy. Osteoporosis and menopause Osteoporosis is a disease in which the bones lose minerals and strength with aging. This can result in bone fractures. If you are 27 years old or older, or if you are at risk for osteoporosis and fractures, ask your health care provider if you should: Be screened for bone loss. Take a calcium or vitamin D supplement to lower your risk of fractures. Be given hormone replacement therapy (HRT) to treat symptoms of menopause. Follow these instructions at home: Alcohol use Do not drink alcohol if: Your health care provider tells you not to drink. You are pregnant, may be pregnant, or are planning to become pregnant. If you drink alcohol: Limit how much you have to: 0-1 drink a day. Know how much alcohol is in your drink. In the [place] one drink equals one 12 oz bottle of beer (355 mL), one 5 oz glass of wine (148 mL), or one 1 oz glass of hard liquor (44 mL). Lifestyle Do not use any products that contain nicotine or tobacco. These products include cigarettes, chewing tobacco, and vaping devices, such as e-cigarettes. If you need help quitting, ask your health care provider. Do not use street drugs. Do not share needles. Ask your health care provider for help if you need support or information about quitting drugs. General instructions Schedule regular health, dental, and eye exams. Stay current with your vaccines. Tell your health care provider if: You often feel depressed. You have ever been abused or do not feel safe at home. Summary Adopting a healthy lifestyle and getting preventive care are important in promoting health and wellness. Follow your  health care provider's instructions about healthy diet, exercising, and getting tested or screened for diseases. Follow your health care provider's instructions on monitoring your cholesterol and blood pressure. This information is not intended to replace advice given to you by your health care provider. Make sure you discuss any questions you have with your health care provider. Document Revised: 09/12/2020 Document Reviewed: 09/12/2020 Elsevier Patient Education  2023 [employer].

## 2022-07-24 NOTE — Progress Notes (Unsigned)
Subjective:    Patient ID: Tamara Robertson, female    DOB: August 22, 1929, 89 y.o.   MRN: [id number]      HPI Tamara Robertson is here for a Physical exam and her chronic medical problems.    Memory - previously if she could not rmemeber a word - it would come in 3 minutes - now it takes a day.  She soemtimes tunrs onf the wrong burner or forgets to turn it off.  Forgetting to flush the commode.    Medications and allergies reviewed with patient and updated if appropriate.  Current Outpatient Medications on File Prior to Visit  Medication Sig Dispense Refill   acetaminophen (TYLENOL) 500 MG tablet Take 500 mg by mouth every 6 (six) hours as needed for moderate pain.     ammonium lactate (AMLACTIN) 12 % lotion 1 application Externally Twice a day     Biotin 2500 MCG CAPS Take 1 capsule by mouth daily.      calcium carbonate (TUMS - DOSED IN MG ELEMENTAL CALCIUM) 500 MG chewable tablet Chew 1 tablet by mouth daily as needed for indigestion or heartburn.     cholecalciferol (VITAMIN D3) 25 MCG (1000 UNIT) tablet Take 1,000 Units by mouth daily.     escitalopram (LEXAPRO) 5 MG tablet TAKE ONE TABLET BY MOUTH ONE TIME DAILY 90 tablet 0   famotidine (PEPCID) 20 MG tablet Take 1 tablet (20 mg total) by mouth 2 (two) times daily as needed for heartburn or indigestion. 180 tablet 0   Flaxseed MISC Take 5 mLs by mouth daily. Take 1 tsp daily     furosemide (LASIX) 20 MG tablet Take 1.5 tablets (30 mg total) by mouth daily. 135 tablet 3   Glucos-Chond-Hyal Ac-Ca Fructo (MOVE FREE JOINT HEALTH ADVANCE PO) Take 1 tablet by mouth daily.     lidocaine (LMX) 4 % cream Apply 1 application  topically daily as needed (inflammation).     lidocaine 4 % Place 1 patch onto the skin daily.     Magnesium Oxide 250 MG TABS Take 1 tablet by mouth.     metoprolol succinate (TOPROL-XL) 25 MG 24 hr tablet TAKE 1/2 (ONE-HALF) TABLET BY MOUTH ONCE DAILY WITH OR IMMEDIATELY FOLLOWING A MEAL 45 tablet 10   Multiple Vitamin  (MULTIVITAMIN) tablet Take 1 tablet by mouth daily.       polyethylene glycol (MIRALAX / GLYCOLAX) packet Take 17 g by mouth daily as needed for moderate constipation.      predniSONE (DELTASONE) 2.5 MG tablet Take 1 tablet (2.5 mg total) by mouth every other day. 45 tablet 3   Probiotic Product (PROBIOTIC DAILY PO) Take 1 tablet by mouth daily with breakfast.     PSYLLIUM PO Take 1 tablet by mouth daily as needed (constipation).     triamcinolone cream (KENALOG) 0.1 % Apply 1 Application topically 2 (two) times daily as needed (rash).     trolamine salicylate (ASPERCREME) 10 % cream Apply 1 application topically as needed for muscle pain.     Zoledronic Acid (RECLAST IV) Once a year, last done 07/12/2022     [DISCONTINUED] metoprolol succinate (TOPROL-XL) 25 MG 24 hr tablet Take 1 tablet (25 mg total) by mouth daily. 30 tablet 1   No current facility-administered medications on file prior to visit.    Review of Systems  Constitutional:  Positive for fatigue. Negative for fever.  Eyes:  Positive for redness.  Respiratory:  Positive for cough (in am and evening) and shortness  of breath. Negative for wheezing.   Cardiovascular:  Positive for leg swelling (mild). Negative for chest pain and palpitations.  Gastrointestinal:  Negative for abdominal pain, constipation, diarrhea and nausea.       No gerd  Genitourinary:  Negative for dysuria.       Urinary incontinece  Musculoskeletal:  Positive for arthralgias and back pain.  Skin:  Negative for rash.  Neurological:  Positive for light-headedness (occ). Negative for headaches.  Psychiatric/Behavioral:  The patient is nervous/anxious.        Objective:   Vitals:   07/25/22 1521  BP: 120/78  Pulse: 70  Temp: 98.4 F (36.9 C)  SpO2: 100%   Filed Weights   07/25/22 1521  Weight: 149 lb (67.6 kg)   Body mass index is 29.1 kg/m.  BP Readings from Last 3 Encounters:  07/25/22 120/78  07/24/22 120/60  07/12/22 (!) 143/66    Wt  Readings from Last 3 Encounters:  07/25/22 149 lb (67.6 kg)  07/24/22 150 lb (68 kg)  07/12/22 145 lb (65.8 kg)       Physical Exam Constitutional: She appears well-developed and well-nourished. No distress.  HENT:  Head: Normocephalic and atraumatic.  Right Ear: External ear normal. Normal ear canal and TM Left Ear: External ear normal.  Normal ear canal and TM Mouth/Throat: Oropharynx is clear and moist.  Eyes: Conjunctivae normal.  Neck: Neck supple. No tracheal deviation present. No thyromegaly present.  No carotid bruit  Cardiovascular: Normal rate, irregular rhythm and normal heart sounds.   3/6 sys murmur heard.  Trace bilateral lower extremity edema. Pulmonary/Chest: Effort normal and breath sounds normal. No respiratory distress. She has no wheezes. She has no rales.  Breast: deferred   Abdominal: Soft. She exhibits no distension. There is no tenderness.  Lymphadenopathy: She has no cervical adenopathy.  Skin: Skin is warm and dry. She is not diaphoretic.  Psychiatric: She has a normal mood and affect. Her behavior is normal.     Lab Results  Component Value Date   WBC 12.1 (H) 05/25/2022   HGB 13.1 05/25/2022   HCT 40.0 05/25/2022   PLT 174.0 05/25/2022   GLUCOSE 120 (H) 06/25/2022   CHOL 190 12/14/2020   TRIG 262.0 (H) 12/14/2020   HDL 46.00 12/14/2020   LDLDIRECT 118.0 12/14/2020   LDLCALC 107 (H) 05/23/2017   ALT 15 06/25/2022   AST 22 06/25/2022   NA 141 06/25/2022   K 4.3 06/25/2022   CL 101 06/25/2022   CREATININE 0.93 06/25/2022   BUN 20 06/25/2022   CO2 33 (H) 06/25/2022   TSH 1.984 01/06/2021   INR 1.1 02/17/2022   HGBA1C 5.8 12/14/2020         Assessment & Plan:   Physical exam: Screening blood work  ordered Exercise limited by foot pain, back pain Weight good for age Substance abuse  none   Reviewed recommended immunizations.   Health Maintenance  Topic Date Due   Medicare Annual Wellness (AWV)  08/31/2022   INFLUENZA  VACCINE  08/05/2022 (Originally 12/05/2021)   COVID-19 Vaccine (9 - 2023-24 season) 08/10/2022 (Originally 05/06/2022)   DEXA SCAN  05/25/2025   DTaP/Tdap/Td (3 - Td or Tdap) 06/04/2032   Pneumonia Vaccine 28+ Years old  Completed   Zoster Vaccines- Shingrix  Completed   HPV VACCINES  Aged Out          See Problem List for Assessment and Plan of chronic medical problems.

## 2022-07-25 ENCOUNTER — Ambulatory Visit (INDEPENDENT_AMBULATORY_CARE_PROVIDER_SITE_OTHER): Payer: Medicare Other | Admitting: Internal Medicine

## 2022-07-25 ENCOUNTER — Encounter: Payer: Self-pay | Admitting: Internal Medicine

## 2022-07-25 VITALS — BP 120/78 | HR 70 | Temp 98.4°F | Ht 60.0 in | Wt 149.0 lb

## 2022-07-25 DIAGNOSIS — K219 Gastro-esophageal reflux disease without esophagitis: Secondary | ICD-10-CM

## 2022-07-25 DIAGNOSIS — R7303 Prediabetes: Secondary | ICD-10-CM | POA: Diagnosis not present

## 2022-07-25 DIAGNOSIS — F419 Anxiety disorder, unspecified: Secondary | ICD-10-CM

## 2022-07-25 DIAGNOSIS — I2510 Atherosclerotic heart disease of native coronary artery without angina pectoris: Secondary | ICD-10-CM

## 2022-07-25 DIAGNOSIS — D509 Iron deficiency anemia, unspecified: Secondary | ICD-10-CM | POA: Diagnosis not present

## 2022-07-25 DIAGNOSIS — I480 Paroxysmal atrial fibrillation: Secondary | ICD-10-CM | POA: Diagnosis not present

## 2022-07-25 DIAGNOSIS — F32A Depression, unspecified: Secondary | ICD-10-CM

## 2022-07-25 DIAGNOSIS — M8589 Other specified disorders of bone density and structure, multiple sites: Secondary | ICD-10-CM | POA: Diagnosis not present

## 2022-07-25 DIAGNOSIS — G6289 Other specified polyneuropathies: Secondary | ICD-10-CM

## 2022-07-25 DIAGNOSIS — M810 Age-related osteoporosis without current pathological fracture: Secondary | ICD-10-CM | POA: Diagnosis not present

## 2022-07-25 DIAGNOSIS — E785 Hyperlipidemia, unspecified: Secondary | ICD-10-CM | POA: Diagnosis not present

## 2022-07-25 DIAGNOSIS — Z0000 Encounter for general adult medical examination without abnormal findings: Secondary | ICD-10-CM | POA: Diagnosis not present

## 2022-07-25 DIAGNOSIS — I10 Essential (primary) hypertension: Secondary | ICD-10-CM | POA: Diagnosis not present

## 2022-07-25 DIAGNOSIS — I739 Peripheral vascular disease, unspecified: Secondary | ICD-10-CM

## 2022-07-25 DIAGNOSIS — D693 Immune thrombocytopenic purpura: Secondary | ICD-10-CM

## 2022-07-25 LAB — IBC PANEL
Iron: 72 ug/dL (ref 42–145)
Saturation Ratios: 21.7 % (ref 20.0–50.0)
TIBC: 331.8 ug/dL (ref 250.0–450.0)
Transferrin: 237 mg/dL (ref 212.0–360.0)

## 2022-07-25 LAB — LDL CHOLESTEROL, DIRECT: Direct LDL: 107 mg/dL

## 2022-07-25 LAB — CBC WITH DIFFERENTIAL/PLATELET
Basophils Absolute: 0.1 10*3/uL (ref 0.0–0.1)
Basophils Relative: 1 % (ref 0.0–3.0)
Eosinophils Absolute: 0.3 10*3/uL (ref 0.0–0.7)
Eosinophils Relative: 2.1 % (ref 0.0–5.0)
HCT: 38.4 % (ref 36.0–46.0)
Hemoglobin: 12.8 g/dL (ref 12.0–15.0)
Lymphocytes Relative: 35.2 % (ref 12.0–46.0)
Lymphs Abs: 4.3 10*3/uL — ABNORMAL HIGH (ref 0.7–4.0)
MCHC: 33.3 g/dL (ref 30.0–36.0)
MCV: 83.9 fl (ref 78.0–100.0)
Monocytes Absolute: 1.6 10*3/uL — ABNORMAL HIGH (ref 0.1–1.0)
Monocytes Relative: 13 % — ABNORMAL HIGH (ref 3.0–12.0)
Neutro Abs: 6 10*3/uL (ref 1.4–7.7)
Neutrophils Relative %: 48.7 % (ref 43.0–77.0)
Platelets: 316 10*3/uL (ref 150.0–400.0)
RBC: 4.58 Mil/uL (ref 3.87–5.11)
RDW: 14.5 % (ref 11.5–15.5)
WBC: 12.3 10*3/uL — ABNORMAL HIGH (ref 4.0–10.5)

## 2022-07-25 LAB — LIPID PANEL
Cholesterol: 175 mg/dL (ref 0–200)
HDL: 44 mg/dL (ref >39.00)
NonHDL: 131.49
Total CHOL/HDL Ratio: 4
Triglycerides: 236 mg/dL — ABNORMAL HIGH (ref 0.0–149.0)
VLDL: 47.2 mg/dL — ABNORMAL HIGH (ref 0.0–40.0)

## 2022-07-25 LAB — COMPREHENSIVE METABOLIC PANEL
ALT: 13 U/L (ref 0–35)
AST: 19 U/L (ref 0–37)
Albumin: 4 g/dL (ref 3.5–5.2)
Alkaline Phosphatase: 74 U/L (ref 39–117)
BUN: 18 mg/dL (ref 6–23)
CO2: 32 mEq/L (ref 19–32)
Calcium: 9.5 mg/dL (ref 8.4–10.5)
Chloride: 99 mEq/L (ref 96–112)
Creatinine, Ser: 0.83 mg/dL (ref 0.40–1.20)
GFR: 61.87 mL/min (ref >60.00)
Glucose, Bld: 92 mg/dL (ref 70–99)
Potassium: 4.2 mEq/L (ref 3.5–5.1)
Sodium: 139 mEq/L (ref 135–145)
Total Bilirubin: 0.4 mg/dL (ref 0.2–1.2)
Total Protein: 7.6 g/dL (ref 6.0–8.3)

## 2022-07-25 LAB — FERRITIN: Ferritin: 66.9 ng/mL (ref 10.0–291.0)

## 2022-07-25 LAB — HEMOGLOBIN A1C: Hgb A1c MFr Bld: 6.2 % (ref 4.6–6.5)

## 2022-07-25 LAB — TSH: TSH: 2.64 u[IU]/mL (ref 0.35–5.50)

## 2022-07-25 LAB — VITAMIN D 25 HYDROXY (VIT D DEFICIENCY, FRACTURES): VITD: 47.07 ng/mL (ref 30.00–100.00)

## 2022-07-25 NOTE — Assessment & Plan Note (Signed)
Chronic Recently had Reclast #1-had many side effects and she still not feeling well since then This point may not want to do an additional infusion next year-we will discuss this in a year Not able to exercise secondary to chronic back pain, foot pain Continue calcium and vitamin D Check vitamin D level

## 2022-07-25 NOTE — Assessment & Plan Note (Signed)
Did iron infusions and has felt better Just recently followed up with GI Will check iron panel, ferritin, CBC

## 2022-07-25 NOTE — Assessment & Plan Note (Signed)
Chronic Check A1c Low sugar/carbohydrate diet 

## 2022-07-25 NOTE — Assessment & Plan Note (Signed)
Chronic No symptoms c/w angina following with cardiology BP well controlled 

## 2022-07-25 NOTE — Assessment & Plan Note (Signed)
Chronic Stable 

## 2022-07-25 NOTE — Assessment & Plan Note (Signed)
Chronic History of LAA stents 2011 Following with vascular surgery

## 2022-07-25 NOTE — Assessment & Plan Note (Signed)
Chronic Stable CBC today Following with heme-onc Continue prednisone 2.5 mg every other day

## 2022-07-25 NOTE — Assessment & Plan Note (Signed)
Chronic Regular exercise and healthy diet encouraged Check lipid panel  Statin intolerant Given age continue lifestyle

## 2022-07-25 NOTE — Assessment & Plan Note (Signed)
Chronic Following with cardiology On metoprolol XL 12.5 mg daily Not on anticoagulation secondary to GI bleed

## 2022-07-25 NOTE — Assessment & Plan Note (Signed)
Chronic GERD controlled Continue Pepcid 2 times daily

## 2022-07-25 NOTE — Assessment & Plan Note (Signed)
Chronic Controlled, Stable Continue Lexapro 5 mg daily 

## 2022-07-25 NOTE — Assessment & Plan Note (Signed)
Chronic Blood pressure is well-controlled Continue metoprolol XL 12.5 mg daily

## 2022-07-28 ENCOUNTER — Other Ambulatory Visit: Payer: Self-pay | Admitting: Internal Medicine

## 2022-08-01 ENCOUNTER — Ambulatory Visit: Payer: Medicare Other | Admitting: Emergency Medicine

## 2022-08-01 ENCOUNTER — Encounter: Payer: Self-pay | Admitting: Emergency Medicine

## 2022-08-01 VITALS — BP 132/76 | HR 78 | Temp 97.7°F | Ht 61.0 in | Wt 149.6 lb

## 2022-08-01 DIAGNOSIS — R9389 Abnormal findings on diagnostic imaging of other specified body structures: Secondary | ICD-10-CM

## 2022-08-01 NOTE — Assessment & Plan Note (Signed)
Multiple pulmonary nodules.  The medial right upper lobe nodule has changed in size over time and was hypermetabolic on PET scan.  At this 54-month interval scan it has not changed.  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  The other nodules have been stable.  We talked about the pros, cons of diagnostics including navigational bronchoscopy risks/benefits.  Because the nodule has not changed over the last 6 months I recommended that we continue to follow it.  If it does grow then we can revisit either navigational bronchoscopy or possibly referral to radiation oncology for empiric SBRT without a tissue diagnosis.

## 2022-08-01 NOTE — Addendum Note (Signed)
Addended by: Dierdre Highman on: 08/01/2022 02:58 PM   Modules accepted: Orders

## 2022-08-01 NOTE — Progress Notes (Signed)
Subjective:    Patient ID: Tamara Robertson, female    DOB: 03-17-1930, 89 y.o.   MRN: [id number]  HPI  ROV 01/30/22 --Tamara Robertson is 89 years old, follows up today for abnormal CT scan of the chest.  She has an irregular subsolid right upper lobe opacity and a subsolid medial right lower lobe opacity.  There is hypermetabolism in the right upper lobe.  There is also small left upper lobe groundglass nodules, trace activity and a stable left lower lobe opacity.  We had decided to be conservative and follow with CT imaging.  She has been uncertain as to whether she would want to undergo diagnostics or treatment.  CT chest was done 01/23/2022 She has cough that can happen at random. Has responded to pepcid in the past but she is having breakthrough reflux.   Super D CT chest 01/23/2022 reviewed by me shows stability of her scattered pulmonary nodules, superior segmental left lower lobe scar.  The spiculated mixed density right upper lobe nodule is slightly more contracted and solid in appearance, again consistent with highly differentiated adenocarcinoma.  ROV 08/01/22 --89 year old woman who follows up today for a spiculated mixed density right upper lobe pulmonary nodule that we have been following with serial imaging.  She has other scattered pulmonary nodules including a subsolid medial right lower lobe opacity, small left upper lobe groundglass nodule, stable left lower lobe opacity.  She had a repeat scan 07/09/2022 as below  CT scan of the chest 07/09/2022 reviewed by me, shows bilateral pleural parenchymal apical scar with some calcification, stable superior segmental left lower lobe airspace disease, some partially confluent right apical scar, stable.  Her 1.8 x 0.8 cm subsolid right upper lobe nodule is stable in size and appearance   Review of Systems As per HPI  Past Medical History:  Diagnosis Date   Achilles tendonitis    Acute lower GI bleeding 2023   Anxiety    Aortic atherosclerosis (HCC)     CAD (coronary artery disease)    RCA 40% stenosis cath 01/2011   Diverticular stricture [facility]) 2006   [facility]   DIVERTICULITIS, HX OF    Diverticulosis    DYSLIPIDEMIA    Elevated LFTs    GERD    Glaucoma    Hepatic steatosis    HOH (hard of hearing)    Immune thrombocytopenic purpura (HCC)    chronic - baseline 80-100K, on pred   Irritable bowel syndrome    Left ovarian cyst dx 01/2013 CT   working with gyn, ?malignant - elevated tumor marker OVA1   OSTEOARTHRITIS, KNEE, RIGHT    OSTEOPENIA    OVERACTIVE BLADDER    Plantar fasciitis    Prolapse of female bladder, acquired 11/2017   Torn rotator cuff    right shoulder   URINARY INCONTINENCE      Family History  Problem Relation Age of Onset   Coronary artery disease Mother    Heart attack Mother 44   Hyperlipidemia Mother    Hypertension Mother    Stomach cancer Father    Hypertension Daughter    Hyperlipidemia Daughter    Arthritis Other        parent   Transient ischemic attack Other        parent     Social History   Socioeconomic History   Marital status: Widowed    Spouse name: Not on file   Number of children: 2   Years of education: Not  on file   Highest education level: Not on file  Occupational History   Occupation: Retired    Comment: Nutritional therapist office  Tobacco Use   Smoking status: Never   Smokeless tobacco: Never  Vaping Use   Vaping Use: Never used  Substance and Sexual Activity   Alcohol use: No   Drug use: No   Sexual activity: Not Currently    Birth control/protection: Surgical    Comment: 1st intercourse- 19, partners- 2, hysterectomy  Other Topics Concern   Not on file  Social History Narrative   Retired Futures trader.    [place] Ham to [place] from [place] 05/2010 to be close to kids   Social Determinants of Health   Financial Resource Strain: Low Risk  (08/30/2021)   Overall Financial Resource Strain (CARDIA)    Difficulty of Paying Living  Expenses: Not hard at all  Food Insecurity: No Food Insecurity (08/30/2021)   Hunger Vital Sign    Worried About Running Out of Food in the Last Year: Never true    Ran Out of Food in the Last Year: Never true  Transportation Needs: No Transportation Needs (08/30/2021)   PRAPARE - Administrator, Civil Service (Medical): No    Lack of Transportation (Non-Medical): No  Physical Activity: Inactive (08/05/2020)   Exercise Vital Sign    Days of Exercise per Week: 0 days    Minutes of Exercise per Session: 0 min  Stress: No Stress Concern Present (08/30/2021)   [employer] of Occupational Health - Occupational Stress Questionnaire    Feeling of Stress : Not at all  Social Connections: Socially Isolated (08/30/2021)   Social Connection and Isolation Panel [NHANES]    Frequency of Communication with Friends and Family: More than three times a week    Frequency of Social Gatherings with Friends and Family: Once a week    Attends Religious Services: Never    Database administrator or Organizations: No    Attends Banker Meetings: Never    Marital Status: Widowed  Intimate Partner Violence: Not At Risk (08/30/2021)   Humiliation, Afraid, Rape, and Kick questionnaire    Fear of Current or Ex-Partner: No    Emotionally Abused: No    Physically Abused: No    Sexually Abused: No     Allergies  Allergen Reactions   Aspirin Other (See Comments)    ITP   Sulfa Antibiotics Other (See Comments)    dizziness   Contrast Media [Iodinated Contrast Media] Hives    CAT scan contrast only   Shellfish-Derived Products Hives    Other reaction(s): Other   Apixaban Other (See Comments)    Rash   Fluoxetine     Did not feel well on it    Sulfamethoxazole-Trimethoprim     Other reaction(s): Unknown   Celebrex [Celecoxib] Other (See Comments)    Leg swelling   Celexa [Citalopram] Other (See Comments)    Felt out of it   Statins Other (See Comments)     Outpatient  Medications Prior to Visit  Medication Sig Dispense Refill   acetaminophen (TYLENOL) 500 MG tablet Take 500 mg by mouth every 6 (six) hours as needed for moderate pain.     ammonium lactate (AMLACTIN) 12 % lotion 1 application Externally Twice a day     Biotin 2500 MCG CAPS Take 1 capsule by mouth daily.      calcium carbonate (TUMS - DOSED IN MG ELEMENTAL CALCIUM) 500  MG chewable tablet Chew 1 tablet by mouth daily as needed for indigestion or heartburn.     cholecalciferol (VITAMIN D3) 25 MCG (1000 UNIT) tablet Take 1,000 Units by mouth daily.     escitalopram (LEXAPRO) 5 MG tablet TAKE ONE TABLET BY MOUTH ONE TIME DAILY 90 tablet 0   famotidine (PEPCID) 20 MG tablet TAKE 1 TABLET (20 MG TOTAL) BY MOUTH 2 (TWO) TIMES DAILY AS NEEDED FOR HEARTBURN OR INDIGESTION. 180 tablet 1   Flaxseed MISC Take 5 mLs by mouth daily. Take 1 tsp daily     furosemide (LASIX) 20 MG tablet Take 1.5 tablets (30 mg total) by mouth daily. 135 tablet 3   Glucos-Chond-Hyal Ac-Ca Fructo (MOVE FREE JOINT HEALTH ADVANCE PO) Take 1 tablet by mouth daily.     lidocaine (LMX) 4 % cream Apply 1 application  topically daily as needed (inflammation).     lidocaine 4 % Place 1 patch onto the skin daily.     Magnesium Oxide 250 MG TABS Take 1 tablet by mouth.     metoprolol succinate (TOPROL-XL) 25 MG 24 hr tablet TAKE 1/2 (ONE-HALF) TABLET BY MOUTH ONCE DAILY WITH OR IMMEDIATELY FOLLOWING A MEAL 45 tablet 10   Multiple Vitamin (MULTIVITAMIN) tablet Take 1 tablet by mouth daily.       polyethylene glycol (MIRALAX / GLYCOLAX) packet Take 17 g by mouth daily as needed for moderate constipation.      predniSONE (DELTASONE) 2.5 MG tablet Take 1 tablet (2.5 mg total) by mouth every other day. 45 tablet 3   Probiotic Product (PROBIOTIC DAILY PO) Take 1 tablet by mouth daily with breakfast.     PSYLLIUM PO Take 1 tablet by mouth daily as needed (constipation).     triamcinolone cream (KENALOG) 0.1 % Apply 1 Application topically 2 (two)  times daily as needed (rash).     trolamine salicylate (ASPERCREME) 10 % cream Apply 1 application topically as needed for muscle pain.     Zoledronic Acid (RECLAST IV) Once a year, last done 07/12/2022     No facility-administered medications prior to visit.         Objective:   Physical Exam Vitals:   08/01/22 1420  BP: 132/76  Pulse: 78  Temp: 97.7 F (36.5 C)  TempSrc: Oral  SpO2: 98%  Weight: 149 lb 9.6 oz (67.9 kg)  Height: 5\' 1"  (1.549 m)   Gen: Pleasant, elderly woman, in no distress,  normal affect  ENT: No lesions,  mouth clear,  oropharynx clear, no postnasal drip  Neck: No JVD, no stridor  Lungs: No use of accessory muscles, no crackles or wheezing on normal respiration, no wheeze on forced expiration  Cardiovascular: RRR, heart sounds normal, no murmur or gallops, trace peripheral edema  Musculoskeletal: No deformities, no cyanosis or clubbing  Neuro: alert, awake, non focal  Skin: Warm, no lesions or rash       Assessment & Plan:   Abnormal CT of the chest Multiple pulmonary nodules.  The medial right upper lobe nodule has changed in size over time and was hypermetabolic on PET scan.  At this 46-month interval scan it has not changed.  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  The other nodules have been stable.  We talked about the pros, cons of diagnostics including navigational bronchoscopy risks/benefits.  Because the nodule has not changed over the last 6 months I recommended that we continue to follow it.  If it does grow then we can revisit  either navigational bronchoscopy or possibly referral to radiation oncology for empiric SBRT without a tissue diagnosis.   Levy Pupa, MD, PhD 08/01/2022, 2:52 PM [facility] [phone] or if no answer before 7:00PM call [phone] For any issues after 7:00PM please call eLink [phone]

## 2022-08-01 NOTE — Patient Instructions (Signed)
We reviewed your CT scan of the chest today. We will plan to repeat your CT chest in September 2024 to follow your pulmonary nodules. Please follow Dr. Lamonte Sakai in September after your scan so we can review the results together.

## 2022-08-08 ENCOUNTER — Ambulatory Visit: Payer: Medicare Other | Admitting: Podiatry

## 2022-08-16 ENCOUNTER — Ambulatory Visit: Payer: Medicare Other | Admitting: Dermatology

## 2022-08-16 ENCOUNTER — Encounter: Payer: Self-pay | Admitting: Dermatology

## 2022-08-16 DIAGNOSIS — L03011 Cellulitis of right finger: Secondary | ICD-10-CM

## 2022-08-16 DIAGNOSIS — L249 Irritant contact dermatitis, unspecified cause: Secondary | ICD-10-CM

## 2022-08-16 MED ORDER — CLOBETASOL PROPIONATE 0.05 % EX OINT: 1.0000 | TOPICAL_OINTMENT | Freq: Two times a day (BID) | CUTANEOUS | 0 refills | Status: DC

## 2022-08-16 NOTE — Patient Instructions (Signed)
Due to recent changes in healthcare laws, you may see results of your pathology and/or laboratory studies on MyChart before the doctors have had a chance to review them. We understand that in some cases there may be results that are confusing or concerning to you. Please understand that not all results are received at the same time and often the doctors may need to interpret multiple results in order to provide you with the best plan of care or course of treatment. Therefore, we ask that you please give us 2 business days to thoroughly review all your results before contacting the office for clarification. Should we see a critical lab result, you will be contacted sooner.   If You Need Anything After Your Visit  If you have any questions or concerns for your doctor, please call our main line at [phone] If no one answers, please leave a voicemail as directed and we will return your call as soon as possible. Messages left after 4 pm will be answered the following business day.   You may also send us a message via MyChart. We typically respond to MyChart messages within 1-2 business days.  For prescription refills, please ask your pharmacy to contact our office. Our fax number is [phone].  If you have an urgent issue when the clinic is closed that cannot wait until the next business day, you can page your doctor at the number below.    Please note that while we do our best to be available for urgent issues outside of office hours, we are not available 24/7.   If you have an urgent issue and are unable to reach us, you may choose to seek medical care at your doctor's office, retail clinic, urgent care center, or emergency room.  If you have a medical emergency, please immediately call 911 or go to the emergency department. In the event of inclement weather, please call our main line at [phone] for an update on the status of any delays or closures.  Dermatology Medication Tips: Please  keep the boxes that topical medications come in in order to help keep track of the instructions about where and how to use these. Pharmacies typically print the medication instructions only on the boxes and not directly on the medication tubes.   If your medication is too expensive, please contact our office at [phone] or send us a message through MyChart.   We are unable to tell what your co-pay for medications will be in advance as this is different depending on your insurance coverage. However, we may be able to find a substitute medication at lower cost or fill out paperwork to get insurance to cover a needed medication.   If a prior authorization is required to get your medication covered by your insurance company, please allow us 1-2 business days to complete this process.  Drug prices often vary depending on where the prescription is filled and some pharmacies may offer cheaper prices.  The website www.goodrx.com contains coupons for medications through different pharmacies. The prices here do not account for what the cost may be with help from insurance (it may be cheaper with your insurance), but the website can give you the price if you did not use any insurance.  - You can print the associated coupon and take it with your prescription to the pharmacy.  - You may also stop by our office during regular business hours and pick up a GoodRx coupon card.  - If you need your   prescription sent electronically to a different pharmacy, notify our office through [facility] MyChart or by phone at [phone]     

## 2022-08-16 NOTE — Progress Notes (Signed)
   New Patient Visit   Subjective  Tamara Robertson is a 89 y.o. female who presents for the following: Infection of second digit on right hand and right toe. The infection around the finger started 6 months ago. The infection comes and goes. Second digit on the right toe comes and goes with infection. Has been using amlactin lotion. No prescription or OTC medications Spot on right ear has been bothering patient. Last time it was treated was three years ago with LN2 but it keeps coming back.  Skin tags on chest are bothersome. Denies pain or irritation.    The following portions of the chart were reviewed this encounter and updated as appropriate: medications, allergies, medical history  Review of Systems:  No other skin or systemic complaints except as noted in HPI or Assessment and Plan.  Objective  Well appearing patient in no apparent distress; mood and affect are within normal limits.  A focused examination was performed of the following areas: Toe, finger, and ear  Relevant exam findings are noted in the Assessment and Plan.  Right 2nd Finger Lateral Paronychium Inflamed irritation around the finger  Right Ear Scaly pink papules coalescing to plaques      Assessment & Plan     Paronychia of finger of right hand Right 2nd Finger Lateral Paronychium  clobetasol ointment (TEMOVATE) 0.05 % - Right 2nd Finger Lateral Paronychium Apply 1 Application topically 2 (two) times daily. Apply topically to the affected area for up to two weeks  Irritant dermatitis Right Ear  Advised to use clobetasol ointment on irritated spot for 1-2 weeks and then stop    Return in about 4 months (around 12/16/2022) for anual skin exam.    Documentation: I have reviewed the above documentation for accuracy and completeness, and I agree with the above.  Langston Reusing, DO  I, Germaine Pomfret, CMA, am acting as scribe for [employer], DO.

## 2022-08-17 ENCOUNTER — Telehealth: Payer: Self-pay | Admitting: Dermatology

## 2022-08-17 NOTE — Telephone Encounter (Signed)
Patient called and requested call back from [facility] regarding her RX

## 2022-08-20 ENCOUNTER — Other Ambulatory Visit: Payer: Self-pay

## 2022-08-20 MED ORDER — CLOBETASOL PROPIONATE 0.05 % EX CREA: 1.0000 | TOPICAL_CREAM | Freq: Two times a day (BID) | CUTANEOUS | 0 refills | Status: DC

## 2022-08-20 NOTE — Telephone Encounter (Signed)
Called patient back about medication. She was prescribed clobetasol ointment but would like the cream instead. Prescription was sent to her pharmacy

## 2022-08-27 ENCOUNTER — Encounter: Payer: Self-pay | Admitting: Internal Medicine

## 2022-08-28 ENCOUNTER — Other Ambulatory Visit: Payer: Self-pay

## 2022-08-28 ENCOUNTER — Ambulatory Visit: Payer: Medicare Other | Admitting: Internal Medicine

## 2022-08-28 VITALS — BP 136/54 | HR 80 | Temp 98.2°F | Ht 61.0 in | Wt 150.0 lb

## 2022-08-28 DIAGNOSIS — R310 Gross hematuria: Secondary | ICD-10-CM | POA: Insufficient documentation

## 2022-08-28 DIAGNOSIS — R300 Dysuria: Secondary | ICD-10-CM | POA: Diagnosis not present

## 2022-08-28 DIAGNOSIS — I10 Essential (primary) hypertension: Secondary | ICD-10-CM

## 2022-08-28 LAB — POC URINALSYSI DIPSTICK (AUTOMATED)
Bilirubin, UA: NEGATIVE
Blood, UA: NEGATIVE
Glucose, UA: NEGATIVE
Ketones, UA: NEGATIVE
Leukocytes, UA: NEGATIVE
Nitrite, UA: NEGATIVE
Protein, UA: NEGATIVE
Spec Grav, UA: <=1.005 — AB (ref 1.010–1.025)
Urobilinogen, UA: 0.2 E.U./dL
pH, UA: 6 (ref 5.0–8.0)

## 2022-08-28 NOTE — Progress Notes (Signed)
Subjective:    Patient ID: Tamara Robertson, female    DOB: 1929-10-19, 89 y.o.   MRN: [id number]      HPI Tamara Robertson is here for  Chief Complaint  Patient presents with   Urinary Tract Infection    Urine burning frequency      Started Saturday morning - had gross hematuria.  Yesterday she had some dysuria, but not today.  Urine looks normal today. Has had some lower back pain.  Not sure if she has an infection or not.  Never had kidney stones.   Showed me a picture of the hematuria.    No energy.    Medications and allergies reviewed with patient and updated if appropriate.  Current Outpatient Medications on File Prior to Visit  Medication Sig Dispense Refill   acetaminophen (TYLENOL) 500 MG tablet Take 500 mg by mouth every 6 (six) hours as needed for moderate pain.     ammonium lactate (AMLACTIN) 12 % lotion 1 application Externally Twice a day     Biotin 2500 MCG CAPS Take 1 capsule by mouth daily.      calcium carbonate (TUMS - DOSED IN MG ELEMENTAL CALCIUM) 500 MG chewable tablet Chew 1 tablet by mouth daily as needed for indigestion or heartburn.     cholecalciferol (VITAMIN D3) 25 MCG (1000 UNIT) tablet Take 1,000 Units by mouth daily.     clobetasol cream (TEMOVATE) 0.05 % Apply 1 Application topically 2 (two) times daily. Apply topically to the affected areas twice a day for up to two weeks. 30 g 0   clobetasol ointment (TEMOVATE) 0.05 % Apply 1 Application topically 2 (two) times daily. Apply topically to the affected area for up to two weeks 30 g 0   escitalopram (LEXAPRO) 5 MG tablet TAKE ONE TABLET BY MOUTH ONE TIME DAILY 90 tablet 0   famotidine (PEPCID) 20 MG tablet TAKE 1 TABLET (20 MG TOTAL) BY MOUTH 2 (TWO) TIMES DAILY AS NEEDED FOR HEARTBURN OR INDIGESTION. 180 tablet 1   Flaxseed MISC Take 5 mLs by mouth daily. Take 1 tsp daily     furosemide (LASIX) 20 MG tablet Take 1.5 tablets (30 mg total) by mouth daily. 135 tablet 3   Glucos-Chond-Hyal Ac-Ca Fructo (MOVE FREE  JOINT HEALTH ADVANCE PO) Take 1 tablet by mouth daily.     latanoprost (XALATAN) 0.005 % ophthalmic solution SMARTSIG:In Eye(s)     lidocaine (LMX) 4 % cream Apply 1 application  topically daily as needed (inflammation).     lidocaine 4 % Place 1 patch onto the skin daily.     Magnesium Oxide 250 MG TABS Take 1 tablet by mouth.     metoprolol succinate (TOPROL-XL) 25 MG 24 hr tablet TAKE 1/2 (ONE-HALF) TABLET BY MOUTH ONCE DAILY WITH OR IMMEDIATELY FOLLOWING A MEAL 45 tablet 10   Multiple Vitamin (MULTIVITAMIN) tablet Take 1 tablet by mouth daily.       polyethylene glycol (MIRALAX / GLYCOLAX) packet Take 17 g by mouth daily as needed for moderate constipation.      predniSONE (DELTASONE) 2.5 MG tablet Take 1 tablet (2.5 mg total) by mouth every other day. 45 tablet 3   Probiotic Product (PROBIOTIC DAILY PO) Take 1 tablet by mouth daily with breakfast.     PSYLLIUM PO Take 1 tablet by mouth daily as needed (constipation).     triamcinolone cream (KENALOG) 0.1 % Apply 1 Application topically 2 (two) times daily as needed (rash).  trolamine salicylate (ASPERCREME) 10 % cream Apply 1 application topically as needed for muscle pain.     Zoledronic Acid (RECLAST IV) Once a year, last done 07/12/2022     [DISCONTINUED] metoprolol succinate (TOPROL-XL) 25 MG 24 hr tablet Take 1 tablet (25 mg total) by mouth daily. 30 tablet 1   No current facility-administered medications on file prior to visit.    Review of Systems  Constitutional:  Positive for fatigue. Negative for fever.  Gastrointestinal:  Negative for abdominal pain and nausea.  Genitourinary:  Positive for dysuria, frequency (chronic) and hematuria. Negative for difficulty urinating.  Musculoskeletal:  Positive for back pain (across lower back).       Objective:   Vitals:   08/28/22 1517  BP: (!) 136/54  Pulse: 80  Temp: 98.2 F (36.8 C)  SpO2: 95%   BP Readings from Last 3 Encounters:  08/28/22 (!) 136/54  08/01/22 132/76   07/25/22 120/78   Wt Readings from Last 3 Encounters:  08/28/22 150 lb (68 kg)  08/01/22 149 lb 9.6 oz (67.9 kg)  07/25/22 149 lb (67.6 kg)   Body mass index is 28.34 kg/m.    Physical Exam Constitutional:      General: She is not in acute distress.    Appearance: Normal appearance. She is not ill-appearing.  HENT:     Head: Atraumatic.  Abdominal:     General: There is no distension.     Palpations: Abdomen is soft. There is no mass.     Tenderness: There is no abdominal tenderness. There is no right CVA tenderness, left CVA tenderness, guarding or rebound.  Musculoskeletal:        General: Tenderness (mild in lumbar spine) present.  Skin:    General: Skin is warm and dry.  Neurological:     Mental Status: She is alert.            Assessment & Plan:    See Problem List for Assessment and Plan of chronic medical problems.

## 2022-08-28 NOTE — Assessment & Plan Note (Signed)
Acute Yesterday only Resolved Urine dip w/o infection - will send for cx No abx needed

## 2022-08-28 NOTE — Patient Instructions (Addendum)
     We will send your urine for a culture.      Medications changes include :   none    A referral was ordered for urology.     Someone will call you to schedule an appointment.

## 2022-08-28 NOTE — Assessment & Plan Note (Signed)
Acute 3 episodes of gross hematuria 3 days ago - has resolved  Some dysuria yesterday - resolved Urine dip w/o evidence of infection - will send for cx Ct ab/p 02/2022 with R renal cyst, no stones Will refer to urology for further evaluation Will let me know if it recurs

## 2022-08-28 NOTE — Assessment & Plan Note (Signed)
Chronic Blood pressure is well-controlled Continue metoprolol XL 12.5 mg daily 

## 2022-08-29 ENCOUNTER — Ambulatory Visit: Payer: Medicare Other | Admitting: Internal Medicine

## 2022-08-30 LAB — CULTURE, URINE COMPREHENSIVE

## 2022-09-07 ENCOUNTER — Encounter: Payer: Self-pay | Admitting: Internal Medicine

## 2022-09-19 ENCOUNTER — Encounter: Payer: Self-pay | Admitting: Dermatology

## 2022-10-08 ENCOUNTER — Telehealth: Payer: Self-pay | Admitting: *Deleted

## 2022-10-08 ENCOUNTER — Other Ambulatory Visit: Payer: Self-pay | Admitting: *Deleted

## 2022-10-08 DIAGNOSIS — D509 Iron deficiency anemia, unspecified: Secondary | ICD-10-CM

## 2022-10-08 NOTE — Telephone Encounter (Signed)
-----   Message from Chrystie Nose, RN sent at 10/08/2022  8:23 AM EDT ----- Regarding: FW: Labs  ----- Message ----- From: Chrystie Nose, RN Sent: 10/06/2022  12:00 AM EDT To: Chrystie Nose, RN Subject: FW: Labs                                        ----- Message ----- From: Beverley Fiedler, MD Sent: 08/28/2022   3:09 PM EDT To: Chrystie Nose, RN Subject: RE: Labs                                       June or July 2024, thanks [facility] JMP  ----- Message ----- From: Chrystie Nose, RN Sent: 08/28/2022   2:40 PM EDT To: Beverley Fiedler, MD Subject: FW: Labs                                       Pt was supposed to have CBC and Iron studies done in April. PCP drew these in March. When would you like pt to repeat lab?  ----- Message ----- From: Chrystie Nose, RN Sent: 08/27/2022  12:00 AM EDT To: Chrystie Nose, RN Subject: Labs                                           Pt needs labs in 3 mth, Orders in epic.

## 2022-10-08 NOTE — Telephone Encounter (Signed)
Patient notified of [facility] lab draw, instructed where to go and and hours of operation 7:30am -5pm M-F. Patient understood and agreed.

## 2022-10-12 ENCOUNTER — Other Ambulatory Visit (INDEPENDENT_AMBULATORY_CARE_PROVIDER_SITE_OTHER): Payer: Medicare Other

## 2022-10-12 DIAGNOSIS — D500 Iron deficiency anemia secondary to blood loss (chronic): Secondary | ICD-10-CM | POA: Diagnosis not present

## 2022-10-12 LAB — CBC WITH DIFFERENTIAL/PLATELET
Basophils Absolute: 0.1 10*3/uL (ref 0.0–0.1)
Basophils Relative: 0.7 % (ref 0.0–3.0)
Eosinophils Absolute: 0.3 10*3/uL (ref 0.0–0.7)
Eosinophils Relative: 2.3 % (ref 0.0–5.0)
HCT: 39.8 % (ref 36.0–46.0)
Hemoglobin: 12.8 g/dL (ref 12.0–15.0)
Lymphocytes Relative: 33.9 % (ref 12.0–46.0)
Lymphs Abs: 3.8 10*3/uL (ref 0.7–4.0)
MCHC: 32.1 g/dL (ref 30.0–36.0)
MCV: 85.4 fl (ref 78.0–100.0)
Monocytes Absolute: 1.3 10*3/uL — ABNORMAL HIGH (ref 0.1–1.0)
Monocytes Relative: 11.5 % (ref 3.0–12.0)
Neutro Abs: 5.8 10*3/uL (ref 1.4–7.7)
Neutrophils Relative %: 51.6 % (ref 43.0–77.0)
Platelets: 178 10*3/uL (ref 150.0–400.0)
RBC: 4.66 Mil/uL (ref 3.87–5.11)
RDW: 13.7 % (ref 11.5–15.5)
WBC: 11.3 10*3/uL — ABNORMAL HIGH (ref 4.0–10.5)

## 2022-10-12 LAB — IBC + FERRITIN
Ferritin: 44.2 ng/mL (ref 10.0–291.0)
Iron: 90 ug/dL (ref 42–145)
Saturation Ratios: 26.5 % (ref 20.0–50.0)
TIBC: 340.2 ug/dL (ref 250.0–450.0)
Transferrin: 243 mg/dL (ref 212.0–360.0)

## 2022-10-16 ENCOUNTER — Emergency Department (HOSPITAL_BASED_OUTPATIENT_CLINIC_OR_DEPARTMENT_OTHER): Payer: Medicare Other | Admitting: Radiology

## 2022-10-16 ENCOUNTER — Emergency Department (HOSPITAL_BASED_OUTPATIENT_CLINIC_OR_DEPARTMENT_OTHER)
Admission: EM | Admit: 2022-10-16 | Discharge: 2022-10-16 | Disposition: A | Discharge status: Home / Self Care | Payer: Medicare Other | Attending: Emergency Medicine | Admitting: Emergency Medicine

## 2022-10-16 ENCOUNTER — Encounter (HOSPITAL_BASED_OUTPATIENT_CLINIC_OR_DEPARTMENT_OTHER): Payer: Self-pay | Admitting: Emergency Medicine

## 2022-10-16 ENCOUNTER — Other Ambulatory Visit: Payer: Self-pay

## 2022-10-16 DIAGNOSIS — S5012XA Contusion of left forearm, initial encounter: Secondary | ICD-10-CM | POA: Diagnosis not present

## 2022-10-16 DIAGNOSIS — Z79899 Other long term (current) drug therapy: Secondary | ICD-10-CM | POA: Diagnosis not present

## 2022-10-16 DIAGNOSIS — R0789 Other chest pain: Secondary | ICD-10-CM | POA: Diagnosis not present

## 2022-10-16 DIAGNOSIS — S8002XA Contusion of left knee, initial encounter: Secondary | ICD-10-CM | POA: Diagnosis not present

## 2022-10-16 DIAGNOSIS — R918 Other nonspecific abnormal finding of lung field: Secondary | ICD-10-CM | POA: Insufficient documentation

## 2022-10-16 DIAGNOSIS — Y92410 Unspecified street and highway as the place of occurrence of the external cause: Secondary | ICD-10-CM | POA: Insufficient documentation

## 2022-10-16 DIAGNOSIS — S8001XA Contusion of right knee, initial encounter: Secondary | ICD-10-CM | POA: Insufficient documentation

## 2022-10-16 DIAGNOSIS — R9389 Abnormal findings on diagnostic imaging of other specified body structures: Secondary | ICD-10-CM

## 2022-10-16 DIAGNOSIS — S8992XA Unspecified injury of left lower leg, initial encounter: Secondary | ICD-10-CM | POA: Diagnosis present

## 2022-10-16 MED ORDER — TRAMADOL HCL 50 MG PO TABS: 50.0000 mg | ORAL_TABLET | Freq: Four times a day (QID) | ORAL | 0 refills | Status: DC | PRN

## 2022-10-16 MED ORDER — ACETAMINOPHEN 325 MG PO TABS
650.0000 mg | ORAL_TABLET | Freq: Once | ORAL | Status: AC
  Administered 2022-10-16: 650 mg via ORAL
  Filled 2022-10-16: qty 2
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
## 2022-10-16 NOTE — Discharge Instructions (Signed)
You have no fractures on your x-rays today.  Please keep your leg elevated and apply ice to help with swelling  You may continue Tylenol for pain and tramadol for severe pain  Follow-up with your doctor  Return to ER if you have worse pain in your chest or knees or back or headache or vomiting
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
## 2022-10-30 ENCOUNTER — Ambulatory Visit: Payer: Medicare Other | Admitting: Internal Medicine

## 2022-10-30 ENCOUNTER — Encounter: Payer: Self-pay | Admitting: Internal Medicine

## 2022-10-30 VITALS — BP 128/60 | HR 77 | Temp 98.0°F | Ht 61.0 in | Wt 148.0 lb

## 2022-10-30 DIAGNOSIS — R0789 Other chest pain: Secondary | ICD-10-CM | POA: Diagnosis not present

## 2022-10-30 DIAGNOSIS — F419 Anxiety disorder, unspecified: Secondary | ICD-10-CM

## 2022-10-30 DIAGNOSIS — M25562 Pain in left knee: Secondary | ICD-10-CM

## 2022-10-30 DIAGNOSIS — F32A Depression, unspecified: Secondary | ICD-10-CM

## 2022-10-30 DIAGNOSIS — M545 Low back pain, unspecified: Secondary | ICD-10-CM

## 2022-10-30 DIAGNOSIS — I10 Essential (primary) hypertension: Secondary | ICD-10-CM

## 2022-10-30 MED ORDER — CEPHALEXIN 500 MG PO CAPS: 500.0000 mg | ORAL_CAPSULE | Freq: Three times a day (TID) | ORAL | 0 refills | Status: AC

## 2022-10-30 NOTE — Assessment & Plan Note (Signed)
Chronic Overall well-controlled Send increased anxiety trying to get a new car, but controlled Continue Lexapro 5 mg daily

## 2022-10-30 NOTE — Patient Instructions (Signed)
      Medications changes include :   none     

## 2022-10-30 NOTE — Assessment & Plan Note (Signed)
Chronic Blood pressure is well-controlled Continue metoprolol XL 12.5 mg daily 

## 2022-11-02 ENCOUNTER — Ambulatory Visit: Payer: Medicare Other | Admitting: Internal Medicine

## 2022-11-07 ENCOUNTER — Other Ambulatory Visit: Payer: Self-pay

## 2022-11-07 ENCOUNTER — Encounter: Payer: Self-pay | Admitting: Internal Medicine

## 2022-11-07 MED ORDER — TRIAMCINOLONE ACETONIDE 0.1 % EX CREA: 1.0000 | TOPICAL_CREAM | Freq: Two times a day (BID) | CUTANEOUS | 0 refills | Status: DC | PRN

## 2022-11-07 NOTE — Telephone Encounter (Signed)
Is this ok to refill.../lmb 

## 2022-11-21 ENCOUNTER — Encounter: Payer: Self-pay | Admitting: Internal Medicine

## 2022-11-21 ENCOUNTER — Ambulatory Visit: Payer: Medicare Other | Admitting: Internal Medicine

## 2022-11-21 VITALS — BP 120/68 | HR 86 | Ht 61.0 in | Wt 150.0 lb

## 2022-11-21 DIAGNOSIS — M549 Dorsalgia, unspecified: Secondary | ICD-10-CM

## 2022-11-21 DIAGNOSIS — K5909 Other constipation: Secondary | ICD-10-CM

## 2022-11-21 DIAGNOSIS — K219 Gastro-esophageal reflux disease without esophagitis: Secondary | ICD-10-CM

## 2022-11-21 DIAGNOSIS — D509 Iron deficiency anemia, unspecified: Secondary | ICD-10-CM

## 2022-11-21 NOTE — Patient Instructions (Addendum)
Continue current medications.  We will reach out to Dr. Delton Coombes regarding adding a CT Abdomen to your upcoming CT chest scan.    You have been scheduled for a follow up appointment on Tuesday, 03-12-23 at 3:40 pm. Please arrive 10 minutes early for registration. If you need to reschedule or cancel this appointment please call [phone] as soon as possible. Thank you.    Thank you for entrusting me with your care and for choosing [facility], Dr. Erick Blinks  If your blood pressure at your visit was 140/90 or greater, please contact your primary care physician to follow up on this. ______________________________________________________  If you are age 87 or older, your body mass index should be between 23-30. Your Body mass index is 28.34 kg/m. If this is out of the aforementioned range listed, please consider follow up with your Primary Care Provider.  If you are age 5 or younger, your body mass index should be between 19-25. Your Body mass index is 28.34 kg/m. If this is out of the aformentioned range listed, please consider follow up with your Primary Care Provider.  ________________________________________________________  The [facility] providers would like to encourage you to use [facility] to communicate with providers for non-urgent requests or questions.  Due to long hold times on the telephone, sending your provider a message by [facility] may be a faster and more efficient way to get a response.  Please allow 48 business hours for a response.  Please remember that this is for non-urgent requests.  _______________________________________________________  Due to recent changes in healthcare laws, you may see the results of your imaging and laboratory studies on MyChart before your provider has had a chance to review them.  We understand that in some cases there may be results that are confusing or concerning to you. Not all laboratory results come back in the same time frame and the  provider may be waiting for multiple results in order to interpret others.  Please give [place] 48 hours in order for your provider to thoroughly review all the results before contacting the office for clarification of your results.
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
## 2022-12-07 ENCOUNTER — Telehealth: Payer: Self-pay

## 2022-12-07 ENCOUNTER — Telehealth: Payer: Self-pay | Admitting: Emergency Medicine

## 2022-12-07 DIAGNOSIS — R911 Solitary pulmonary nodule: Secondary | ICD-10-CM

## 2022-12-07 DIAGNOSIS — I739 Peripheral vascular disease, unspecified: Secondary | ICD-10-CM

## 2022-12-07 DIAGNOSIS — R9389 Abnormal findings on diagnostic imaging of other specified body structures: Secondary | ICD-10-CM

## 2022-12-07 NOTE — Telephone Encounter (Signed)
Caller: Patient  Concern: cramping causing pain that is worse in the AM after waking, numbness, swelling that is better in the AM  Pt denies rest pain, wound, discoloration, or coldness  Location: left leg, right leg, worse in the L leg and groin  Description:  x few wks  Quality: cramping  Treatments:  Instructed pt to elevate, walk as much as possible  Resolution: Appointment scheduled with 1st available with Dr. Lenell Antu per pt request  Next Appt: Appointment scheduled for non-urgent appt on 8/27

## 2022-12-11 ENCOUNTER — Ambulatory Visit: Payer: Medicare Other | Admitting: Cardiology

## 2022-12-11 NOTE — Telephone Encounter (Signed)
Tamara Dunks do you have this order the patient is requesting [facility]

## 2022-12-12 ENCOUNTER — Other Ambulatory Visit: Payer: Self-pay | Admitting: Cardiology

## 2022-12-12 DIAGNOSIS — I10 Essential (primary) hypertension: Secondary | ICD-10-CM

## 2022-12-15 DIAGNOSIS — I340 Nonrheumatic mitral (valve) insufficiency: Secondary | ICD-10-CM | POA: Insufficient documentation

## 2022-12-15 DIAGNOSIS — I351 Nonrheumatic aortic (valve) insufficiency: Secondary | ICD-10-CM | POA: Insufficient documentation

## 2022-12-15 NOTE — Progress Notes (Unsigned)
Cardiology Office Note:   Date:  12/15/2022  ID:  Tamara Robertson, DOB 08/21/1929, MRN [id number] PCP: Pincus Sanes, MD  [facility] Providers Cardiologist:  Rollene Rotunda, MD {  History of Present Illness:   Tamara Robertson is a 89 y.o. female who presents for evaluation of PT.  She has had non obstructive CAD.    She had remote PTA in [place] , and chronic LE edema.   She was in the hospital with new onset atrial fibrillation with rapid ventricular response.  He had demand ischemia with elevated troponins.  She was treated with IV diltiazem and also antibiotics for possible pneumonia.  He converted to spontaneous sinus rhythm.  He had some sinus bradycardia with first-degree AV block and some 2.5-second pauses.  She was sent home on a low-dose beta-blocker and Eliquis.  Her echo demonstrated EF of 60 to 65% with normal wall motion.  She did have some dizziness.  She was to receive a 30-day event monitor.  Because of dizziness we did move her beta-blocker to the evening.    She wore a monitor and there was no evidence of atrial fib.   She came off of DOAC because of GI bleeding and rash on Eliquis.     Since I last saw her ***   ***   she had some shortness of breath.  She might get this when she goes to sit down or at rest.  She really does not notice it as much during the day.  She walks to the grocery store and does some department store shopping and does not necessarily bring on the shortness of breath.  She is not really describing PND or orthopnea.  She is not having any palpitations, presyncope or syncope.  She is not having any new chest pain.  She does not notice any fibrillation.    ROS: ***  Studies Reviewed:    EKG:       ***  Risk Assessment/Calculations:   {Does this patient have ATRIAL FIBRILLATION?:[phone]} No BP recorded.  {Refresh Note OR Click here to enter BP  :1}***        Physical Exam:   VS:  There were no vitals taken for this visit.   Wt Readings  from Last 3 Encounters:  11/21/22 150 lb (68 kg)  10/30/22 148 lb (67.1 kg)  10/16/22 147 lb (66.7 kg)     GEN: Well nourished, well developed in no acute distress NECK: No JVD; No carotid bruits CARDIAC: ***RRR, no murmurs, rubs, gallops RESPIRATORY:  Clear to auscultation without rales, wheezing or rhonchi  ABDOMEN: Soft, non-tender, non-distended EXTREMITIES:  No edema; No deformity   ASSESSMENT AND PLAN:   Paroxysmal Atrial Fibrillation: *** She has not noticed any paroxysms.  She does not tolerate anticoagulation.  No change in therapy.    Mild to Moderate Aortic Regurgitation/Mild to Moderate Mitral Regurgitation: *** Given her shortness of breath I will check an echocardiogram.  She is going to take Lasix 30 mg a day instead of 20.  She had been taking 30 mg back to the 20 when her leg swelling improved.   Hypertension:  ***   The blood pressure is at target.  No change in therapy.         {Are you ordering a CV Procedure (e.g. stress test, cath, DCCV, TEE, etc)?   Press F2        :[id number]}  Follow up ***  Signed, Rollene Rotunda, MD

## 2022-12-17 NOTE — Telephone Encounter (Signed)
Updated order placed.

## 2022-12-18 ENCOUNTER — Ambulatory Visit: Payer: Medicare Other | Attending: Cardiology | Admitting: Cardiology

## 2022-12-18 ENCOUNTER — Encounter: Payer: Self-pay | Admitting: Internal Medicine

## 2022-12-18 ENCOUNTER — Encounter: Payer: Self-pay | Admitting: Cardiology

## 2022-12-18 ENCOUNTER — Encounter: Payer: Self-pay | Admitting: Emergency Medicine

## 2022-12-18 VITALS — BP 126/58 | HR 79 | Ht 61.0 in

## 2022-12-18 DIAGNOSIS — I351 Nonrheumatic aortic (valve) insufficiency: Secondary | ICD-10-CM

## 2022-12-18 DIAGNOSIS — I10 Essential (primary) hypertension: Secondary | ICD-10-CM | POA: Diagnosis not present

## 2022-12-18 DIAGNOSIS — R109 Unspecified abdominal pain: Secondary | ICD-10-CM

## 2022-12-18 DIAGNOSIS — I480 Paroxysmal atrial fibrillation: Secondary | ICD-10-CM | POA: Diagnosis not present

## 2022-12-18 DIAGNOSIS — I340 Nonrheumatic mitral (valve) insufficiency: Secondary | ICD-10-CM

## 2022-12-18 NOTE — Patient Instructions (Signed)
  Follow-Up: At [facility], you and your health needs are our priority.  As part of our continuing mission to provide you with exceptional heart care, we have created designated Provider Care Teams.  These Care Teams include your primary Cardiologist (physician) and Advanced Practice Providers (APPs -  Physician Assistants and Nurse Practitioners) who all work together to provide you with the care you need, when you need it.  We recommend signing up for the patient portal called "MyChart".  Sign up information is provided on this After Visit Summary.  MyChart is used to connect with patients for Virtual Visits (Telemedicine).  Patients are able to view lab/test results, encounter notes, upcoming appointments, etc.  Non-urgent messages can be sent to your provider as well.   To learn more about what you can do with MyChart, go to [email or web address].    Your next appointment:   6 month(s)  Provider:   James Hochrein, MD      

## 2022-12-18 NOTE — Telephone Encounter (Signed)
Will place order with our office now

## 2022-12-25 ENCOUNTER — Ambulatory Visit (INDEPENDENT_AMBULATORY_CARE_PROVIDER_SITE_OTHER): Payer: Medicare Other | Admitting: Dermatology

## 2022-12-25 ENCOUNTER — Encounter: Payer: Self-pay | Admitting: Dermatology

## 2022-12-25 VITALS — BP 127/81

## 2022-12-25 DIAGNOSIS — L603 Nail dystrophy: Secondary | ICD-10-CM | POA: Diagnosis not present

## 2022-12-25 DIAGNOSIS — L84 Corns and callosities: Secondary | ICD-10-CM

## 2022-12-25 DIAGNOSIS — L820 Inflamed seborrheic keratosis: Secondary | ICD-10-CM | POA: Diagnosis not present

## 2022-12-25 DIAGNOSIS — L570 Actinic keratosis: Secondary | ICD-10-CM

## 2022-12-25 DIAGNOSIS — L814 Other melanin hyperpigmentation: Secondary | ICD-10-CM

## 2022-12-25 MED ORDER — TRIAMCINOLONE ACETONIDE 10 MG/ML IJ SUSP
10.0000 mg | Freq: Once | INTRAMUSCULAR | Status: AC
Start: 2022-12-25 — End: 2022-12-25
  Administered 2022-12-25: 10 mg

## 2022-12-25 NOTE — Progress Notes (Signed)
Follow-Up Visit   Subjective  Tamara Robertson is a 89 y.o. female who presents for the following: Paronychia on the right 2nd finger and right 2nd toe last seen 08/16/2022. She states it has not changed much. Pain is an 8 out of 10. She uses Clobetasol cream for 2 weeks then stops. She has tried corn pads.   Irritant Dermatitis at the right ear is resolved. She treated it with Clobetasol cream for 2 weeks  She has 2 growths on the left lat lower leg x 2-3 weeks. Itch is an 8 out of 10. She tried Triamcinolone BID x 10 days. Itching comes back when she stops it.   She has 2 brown  growths on the chest that she has had for several years. She says they get irritated with her life alert necklace and wants to have them treated.  The following portions of the chart were reviewed this encounter and updated as appropriate:      Review of Systems: No other skin or systemic complaints.  Objective  Well appearing patient in no apparent distress; mood and affect are within normal limits.       A focused examination was performed including lower extremities, including the legs, feet, toes, and toenails. Relevant physical exam findings are noted in the Assessment and Plan. Including chest and right hand. Right 2nd Distal Interphalangeal Joint of Toe Erythematous thickened plaque  Left Lower Leg - Anterior (4) Erythematous thin papules/macules with gritty scale.   chest (7) Brown waxy plaques   Assessment & Plan  Callus Right 2nd Distal Interphalangeal Joint of Toe  Intralesional injection - Right 2nd Distal Interphalangeal Joint of Toe Procedure Note Intralesional Injection  Location: right 2nd toe  Informed Consent: Discussed risks (infection, pain, bleeding, bruising, thinning of the skin, loss of skin pigment, lack of resolution, and recurrence of lesion) and benefits of the procedure, as well as the alternatives. Informed consent was obtained. Preparation: The area was prepared a  standard fashion.  Anesthesia:  Procedure Details: An intralesional injection was performed with Kenalog 10 mg/cc. 0.2 cc in total were injected. NDC #: 1610-9604-54  Exp: 06/07/2024  Total number of injections: 1  Plan: The patient was instructed on post-op care. Recommend OTC analgesia as needed for pain.   Related Medications triamcinolone acetonide (KENALOG) 10 MG/ML injection 10 mg   Actinic keratosis (4) Left Lower Leg - Anterior  Destruction of lesion - Left Lower Leg - Anterior (4) Complexity: simple   Destruction method: cryotherapy   Informed consent: discussed and consent obtained   Timeout:  patient name, date of birth, surgical site, and procedure verified Lesion destroyed using liquid nitrogen: Yes   Outcome: patient tolerated procedure well with no complications   Post-procedure details: wound care instructions given    Inflamed seborrheic keratosis (7) chest  Destruction of lesion - chest (7) Complexity: simple   Destruction method: cryotherapy   Informed consent: discussed and consent obtained   Timeout:  patient name, date of birth, surgical site, and procedure verified Lesion destroyed using liquid nitrogen: Yes   Post-procedure details: wound care instructions given     NAIL PROBLEM Exam: dystrophic nail right 2nd finger  Treatment Plan: Advised OTC Elon nail condition   Calus Exam: Thickened erythematous plaque at the right 2nd toe  Treatment Plan: Kenalog 10mg  0.1 cc injected IL.    Jaclynn Guarneri, CMA, am acting as scribe for [employer], DO.  Return in about 2 months (around 02/24/2023) for  AK Follow Up.

## 2022-12-25 NOTE — Patient Instructions (Addendum)
Hi  Ronin,  Thank you for visiting [place] again and for your dedication to enhancing your health. It was a pleasure to see you and discuss both the progress and new concerns regarding your skin conditions. Below is a summary of the essential instructions and recommendations from today's visit:     - Elon Nail Conditioner: Use daily to encourage nail growth for nail dystrophy of finger on right hand  - Kenalog Injection into Callus on right foot:   - Treatment Area: Administered 0.01 ml into the callus on the right foot.   - Expectations: Temporary fullness; soreness should decrease within 24-48 hours, and the callus should flatten over four weeks.  - Liquid Nitrogen Treatment:   - Purpose: Applied to porokeratosis spots on lower legs  to induce crusting and eventual fall off.   - Aftercare: Use Vaseline or Aquaphor generously on treated spots. A sample of Aquaphor was provided for your use.  - Follow-Up Appointment: Scheduled in two months to assess the condition of the foot and toe. Further treatment may be considered at that time.  Please make sure to adhere to all care instructions provided. Should you have any questions or concerns before your next appointment, do not hesitate to reach out to our office.  Wishing you a smooth and swift recovery.  Best regards,  Dr. Langston Reusing, Dermatologist       Cryotherapy Aftercare  Wash gently with soap and water everyday.   Apply Vaseline and Band-Aid daily until healed.   Important Information  Due to recent changes in healthcare laws, you may see results of your pathology and/or laboratory studies on MyChart before the doctors have had a chance to review them. We understand that in some cases there may be results that are confusing or concerning to you. Please understand that not all results are received at the same time and often the doctors may need to interpret multiple results in order to provide you with the best plan of care or  course of treatment. Therefore, we ask that you please give [place] 2 business days to thoroughly review all your results before contacting the office for clarification. Should we see a critical lab result, you will be contacted sooner.   If You Need Anything After Your Visit  If you have any questions or concerns for your doctor, please call our main line at [phone] If no one answers, please leave a voicemail as directed and we will return your call as soon as possible. Messages left after 4 pm will be answered the following business day.   You may also send [place] a message via MyChart. We typically respond to MyChart messages within 1-2 business days.  For prescription refills, please ask your pharmacy to contact our office. Our fax number is [phone].  If you have an urgent issue when the clinic is closed that cannot wait until the next business day, you can page your doctor at the number below.    Please note that while we do our best to be available for urgent issues outside of office hours, we are not available 24/7.   If you have an urgent issue and are unable to reach [place], you may choose to seek medical care at your doctor's office, retail clinic, urgent care center, or emergency room.  If you have a medical emergency, please immediately call 911 or go to the emergency department. In the event of inclement weather, please call our main line at [phone] for an update  on the status of any delays or closures.  Dermatology Medication Tips: Please keep the boxes that topical medications come in in order to help keep track of the instructions about where and how to use these. Pharmacies typically print the medication instructions only on the boxes and not directly on the medication tubes.   If your medication is too expensive, please contact our office at [phone] or send [place] a message through MyChart.   We are unable to tell what your co-pay for medications will be in advance as  this is different depending on your insurance coverage. However, we may be able to find a substitute medication at lower cost or fill out paperwork to get insurance to cover a needed medication.   If a prior authorization is required to get your medication covered by your insurance company, please allow [place] 1-2 business days to complete this process.  Drug prices often vary depending on where the prescription is filled and some pharmacies may offer cheaper prices.  The website www.goodrx.com contains coupons for medications through different pharmacies. The prices here do not account for what the cost may be with help from insurance (it may be cheaper with your insurance), but the website can give you the price if you did not use any insurance.  - You can print the associated coupon and take it with your prescription to the pharmacy.  - You may also stop by our office during regular business hours and pick up a GoodRx coupon card.  - If you need your prescription sent electronically to a different pharmacy, notify our office through [facility] or by phone at [phone]

## 2022-12-31 NOTE — Progress Notes (Unsigned)
[facility] [place] PROGRESS NOTE  ASSESSMENT / PLAN: Tamara Robertson is a 89 y.o. female with numbness and weakness of bilateral lower extremities. No evidence of significant peripheral arterial disease. Suspect spinal stenosis, radiculopathy, etc. As cause. Will see her again in 1 year with ABI.   SUBJECTIVE: Returns to clinic for evaluation.  She reports occasional numbness and weakness in the lower extremities. She does not describe claudication or rest pain symptoms. No ulcers.   OBJECTIVE: There were no vitals taken for this visit. Well-appearing in no acute distress Regular rate and rhythm Unlabored breathing Right lower extremity erythematous and tender to the touch over the medial malleolus, much improved from previous.  1+ dorsalis pedis pulse bilaterally.     Latest Ref Rng & Units 10/12/2022    4:15 PM 07/25/2022    4:19 PM 05/25/2022    2:15 PM  CBC  WBC 4.0 - 10.5 K/uL 11.3  12.3  12.1   Hemoglobin 12.0 - 15.0 g/dL 16.1  09.6  04.5   Hematocrit 36.0 - 46.0 % 39.8  38.4  40.0   Platelets 150.0 - 400.0 K/uL 178.0  316.0  174.0         Latest Ref Rng & Units 12/18/2022    3:52 PM 07/25/2022    4:19 PM 06/25/2022    3:56 PM  CMP  Glucose 70 - 99 mg/dL 93  92  409   BUN 10 - 36 mg/dL 20  18  20    Creatinine 0.57 - 1.00 mg/dL 8.11  9.14  7.82   Sodium 134 - 144 mmol/L 143  139  141   Potassium 3.5 - 5.2 mmol/L 4.8  4.2  4.3   Chloride 96 - 106 mmol/L 100  99  101   CO2 20 - 29 mmol/L 26  32  33   Calcium 8.7 - 10.3 mg/dL 9.6  9.5  9.8   Total Protein 6.0 - 8.3 g/dL  7.6  7.2   Total Bilirubin 0.2 - 1.2 mg/dL  0.4  0.3   Alkaline Phos 39 - 117 U/L  74  73   AST 0 - 37 U/L  19  22   ALT 0 - 35 U/L  13  15      +-------+-----------+-----------+------------+------------+  ABI/TBIToday's ABIToday's TBIPrevious ABIPrevious TBI  +-------+-----------+-----------+------------+------------+  Right 1.05       0.37       0.73        0.25           +-------+-----------+-----------+------------+------------+  Left  1.00       0.54       0.76        0.40          +-------+-----------+-----------+------------+------------+     Rande Brunt. Lenell Antu, MD [facility] Phone Number: [phone] 12/31/2022 11:12 AM

## 2023-01-01 ENCOUNTER — Ambulatory Visit (HOSPITAL_COMMUNITY)
Admission: RE | Admit: 2023-01-01 | Discharge: 2023-01-01 | Disposition: A | Discharge status: Home / Self Care | Payer: Medicare Other | Source: Ambulatory Visit | Attending: Vascular Surgery | Admitting: Vascular Surgery

## 2023-01-01 ENCOUNTER — Ambulatory Visit (INDEPENDENT_AMBULATORY_CARE_PROVIDER_SITE_OTHER): Payer: Medicare Other | Admitting: Vascular Surgery

## 2023-01-01 ENCOUNTER — Encounter: Payer: Self-pay | Admitting: Vascular Surgery

## 2023-01-01 VITALS — BP 136/63 | HR 88 | Temp 97.9°F | Resp 20 | Ht 61.0 in | Wt 149.0 lb

## 2023-01-01 DIAGNOSIS — I739 Peripheral vascular disease, unspecified: Secondary | ICD-10-CM | POA: Insufficient documentation

## 2023-01-02 LAB — VAS US ABI WITH/WO TBI
Left ABI: 0.8
Right ABI: 0.8

## 2023-01-10 ENCOUNTER — Encounter: Payer: Self-pay | Admitting: Emergency Medicine

## 2023-01-10 NOTE — Telephone Encounter (Signed)
It looks like both are ordered but only the CT abdomen has been scheduled.  She needs a a super D chest without contrast to be done at the same time

## 2023-01-13 ENCOUNTER — Other Ambulatory Visit: Payer: Self-pay | Admitting: Cardiology

## 2023-01-16 ENCOUNTER — Other Ambulatory Visit: Payer: Self-pay | Admitting: Internal Medicine

## 2023-01-18 ENCOUNTER — Other Ambulatory Visit: Payer: Self-pay

## 2023-01-18 DIAGNOSIS — I739 Peripheral vascular disease, unspecified: Secondary | ICD-10-CM

## 2023-01-18 DIAGNOSIS — D509 Iron deficiency anemia, unspecified: Secondary | ICD-10-CM

## 2023-01-23 ENCOUNTER — Other Ambulatory Visit: Payer: Self-pay | Admitting: Emergency Medicine

## 2023-01-23 ENCOUNTER — Other Ambulatory Visit (INDEPENDENT_AMBULATORY_CARE_PROVIDER_SITE_OTHER): Payer: Medicare Other

## 2023-01-23 ENCOUNTER — Other Ambulatory Visit: Payer: Self-pay

## 2023-01-23 ENCOUNTER — Ambulatory Visit (HOSPITAL_COMMUNITY)
Admission: RE | Admit: 2023-01-23 | Discharge: 2023-01-23 | Disposition: A | Discharge status: Home / Self Care | Payer: Medicare Other | Source: Ambulatory Visit | Attending: Emergency Medicine | Admitting: Emergency Medicine

## 2023-01-23 DIAGNOSIS — R9389 Abnormal findings on diagnostic imaging of other specified body structures: Secondary | ICD-10-CM | POA: Insufficient documentation

## 2023-01-23 DIAGNOSIS — R109 Unspecified abdominal pain: Secondary | ICD-10-CM | POA: Insufficient documentation

## 2023-01-23 DIAGNOSIS — D509 Iron deficiency anemia, unspecified: Secondary | ICD-10-CM | POA: Diagnosis not present

## 2023-01-23 LAB — CBC WITH DIFFERENTIAL/PLATELET
Basophils Absolute: 0.1 10*3/uL (ref 0.0–0.1)
Basophils Relative: 0.8 % (ref 0.0–3.0)
Eosinophils Absolute: 0.2 10*3/uL (ref 0.0–0.7)
Eosinophils Relative: 2 % (ref 0.0–5.0)
HCT: 41.4 % (ref 36.0–46.0)
Hemoglobin: 13.5 g/dL (ref 12.0–15.0)
Lymphocytes Relative: 35.4 % (ref 12.0–46.0)
Lymphs Abs: 4 10*3/uL (ref 0.7–4.0)
MCHC: 32.5 g/dL (ref 30.0–36.0)
MCV: 85.3 fl (ref 78.0–100.0)
Monocytes Absolute: 1.2 10*3/uL — ABNORMAL HIGH (ref 0.1–1.0)
Monocytes Relative: 10.6 % (ref 3.0–12.0)
Neutro Abs: 5.8 10*3/uL (ref 1.4–7.7)
Neutrophils Relative %: 51.2 % (ref 43.0–77.0)
Platelets: 175 10*3/uL (ref 150.0–400.0)
RBC: 4.85 Mil/uL (ref 3.87–5.11)
RDW: 14.2 % (ref 11.5–15.5)
WBC: 11.4 10*3/uL — ABNORMAL HIGH (ref 4.0–10.5)

## 2023-01-23 LAB — IBC + FERRITIN
Ferritin: 48 ng/mL (ref 10.0–291.0)
Iron: 74 ug/dL (ref 42–145)
Saturation Ratios: 20.6 % (ref 20.0–50.0)
TIBC: 359.8 ug/dL (ref 250.0–450.0)
Transferrin: 257 mg/dL (ref 212.0–360.0)

## 2023-01-23 MED ORDER — IOHEXOL 300 MG/ML  SOLN: 100.0000 mL | Freq: Once | INTRAMUSCULAR | Status: DC | PRN

## 2023-01-29 ENCOUNTER — Ambulatory Visit: Payer: Medicare Other | Admitting: Internal Medicine

## 2023-01-31 ENCOUNTER — Encounter: Payer: Self-pay | Admitting: Emergency Medicine

## 2023-01-31 ENCOUNTER — Ambulatory Visit: Payer: Medicare Other | Admitting: Emergency Medicine

## 2023-01-31 VITALS — BP 136/64 | HR 69 | Ht 61.0 in | Wt 150.4 lb

## 2023-01-31 DIAGNOSIS — R9389 Abnormal findings on diagnostic imaging of other specified body structures: Secondary | ICD-10-CM

## 2023-01-31 DIAGNOSIS — R911 Solitary pulmonary nodule: Secondary | ICD-10-CM | POA: Diagnosis not present

## 2023-01-31 NOTE — Progress Notes (Signed)
Subjective:    Patient ID: Tamara Robertson, female    DOB: 12/10/1929, 89 y.o.   MRN: [id number]  HPI  ROV 08/01/22 --89 year old woman who follows up today for a spiculated mixed density right upper lobe pulmonary nodule that we have been following with serial imaging.  She has other scattered pulmonary nodules including a subsolid medial right lower lobe opacity, small left upper lobe groundglass nodule, stable left lower lobe opacity.  She had a repeat scan 07/09/2022 as below  CT scan of the chest 07/09/2022 reviewed by me, shows bilateral pleural parenchymal apical scar with some calcification, stable superior segmental left lower lobe airspace disease, some partially confluent right apical scar, stable.  Her 1.8 x 0.8 cm subsolid right upper lobe nodule is stable in size and appearance   ROV 01/31/2023 --follow-up visit for 89 year old woman with history of chronic cough, GERD, abnormal CT scan of the chest with bilateral pleural-parenchymal scarring and right upper lobe subsolid nodular opacity.  She underwent repeat CT chest, abdomen, pelvis 01/23/2023  CT chest abdomen pelvis 01/23/2023 reviewed by me, shows no mediastinal or hilar adenopathy.  The anterior right upper lobe mixed density lesion is 3.6 x 1.5 cm with internal solid component of 0.8 x 1.0 cm, stable compared with her most recent scan but increased in size going back to 2022   Review of Systems As per HPI  Past Medical History:  Diagnosis Date   Achilles tendonitis    Acute lower GI bleeding 2023   Anxiety    Aortic atherosclerosis (HCC)    CAD (coronary artery disease)    RCA 40% stenosis cath 01/2011   Diverticular stricture [facility]) 2006   [facility]   DIVERTICULITIS, HX OF    Diverticulosis    DYSLIPIDEMIA    Elevated LFTs    GERD    Glaucoma    Hepatic steatosis    HOH (hard of hearing)    Immune thrombocytopenic purpura (HCC)    chronic - baseline 80-100K, on pred   Irritable bowel syndrome     Left ovarian cyst dx 01/2013 CT   working with gyn, ?malignant - elevated tumor marker OVA1   OSTEOARTHRITIS, KNEE, RIGHT    OSTEOPENIA    OVERACTIVE BLADDER    Plantar fasciitis    Prolapse of female bladder, acquired 11/2017   Torn rotator cuff    right shoulder   URINARY INCONTINENCE      Family History  Problem Relation Age of Onset   Coronary artery disease Mother    Heart attack Mother 96   Hyperlipidemia Mother    Hypertension Mother    Stomach cancer Father    Hypertension Daughter    Hyperlipidemia Daughter    Arthritis Other        parent   Transient ischemic attack Other        parent     Social History   Socioeconomic History   Marital status: Widowed    Spouse name: Not on file   Number of children: 2   Years of education: Not on file   Highest education level: 12th grade  Occupational History   Occupation: Retired    Comment: Nutritional therapist office  Tobacco Use   Smoking status: Never   Smokeless tobacco: Never  Vaping Use   Vaping status: Never Used  Substance and Sexual Activity   Alcohol use: No   Drug use: No   Sexual activity: Not Currently    Birth  control/protection: Surgical    Comment: 1st intercourse- 19, partners- 2, hysterectomy  Other Topics Concern   Not on file  Social History Narrative   Retired Futures trader.    [place] Ham to [place] from [place] 05/2010 to be close to kids   Social Determinants of Health   Financial Resource Strain: Low Risk  (08/28/2022)   Overall Financial Resource Strain (CARDIA)    Difficulty of Paying Living Expenses: Not hard at all  Food Insecurity: No Food Insecurity (08/28/2022)   Hunger Vital Sign    Worried About Running Out of Food in the Last Year: Never true    Ran Out of Food in the Last Year: Never true  Transportation Needs: No Transportation Needs (08/28/2022)   PRAPARE - Administrator, Civil Service (Medical): No    Lack of Transportation (Non-Medical): No  Physical  Activity: Insufficiently Active (08/28/2022)   Exercise Vital Sign    Days of Exercise per Week: 2 days    Minutes of Exercise per Session: 20 min  Stress: Stress Concern Present (08/28/2022)   [employer] of Occupational Health - Occupational Stress Questionnaire    Feeling of Stress : To some extent  Social Connections: Moderately Integrated (08/28/2022)   Social Connection and Isolation Panel [NHANES]    Frequency of Communication with Friends and Family: More than three times a week    Frequency of Social Gatherings with Friends and Family: Once a week    Attends Religious Services: More than 4 times per year    Active Member of [employer] or Organizations: Yes    Attends Banker Meetings: More than 4 times per year    Marital Status: Widowed  Intimate Partner Violence: Not At Risk (08/30/2021)   Humiliation, Afraid, Rape, and Kick questionnaire    Fear of Current or Ex-Partner: No    Emotionally Abused: No    Physically Abused: No    Sexually Abused: No     Allergies  Allergen Reactions   Aspirin Other (See Comments)    ITP   Sulfa Antibiotics Other (See Comments)    dizziness   Contrast Media [Iodinated Contrast Media] Hives    CAT scan contrast only   Shellfish-Derived Products Hives    Other reaction(s): Other   Apixaban Other (See Comments)    Rash   Fluoxetine     Did not feel well on it    Sulfamethoxazole-Trimethoprim     Other reaction(s): Unknown   Celebrex [Celecoxib] Other (See Comments)    Leg swelling   Celexa [Citalopram] Other (See Comments)    Felt out of it   Statins Other (See Comments)     Outpatient Medications Prior to Visit  Medication Sig Dispense Refill   acetaminophen (TYLENOL) 500 MG tablet Take 500 mg by mouth every 6 (six) hours as needed for moderate pain.     ammonium lactate (AMLACTIN) 12 % lotion 1 application Externally Twice a day     Biotin 2500 MCG CAPS Take 1 capsule by mouth daily.      calcium carbonate (TUMS -  DOSED IN MG ELEMENTAL CALCIUM) 500 MG chewable tablet Chew 1 tablet by mouth daily as needed for indigestion or heartburn.     cholecalciferol (VITAMIN D3) 25 MCG (1000 UNIT) tablet Take 1,000 Units by mouth daily.     clobetasol ointment (TEMOVATE) 0.05 % Apply 1 Application topically 2 (two) times daily. Apply topically to the affected area for up to two weeks 30  g 0   escitalopram (LEXAPRO) 5 MG tablet TAKE ONE TABLET BY MOUTH ONE TIME DAILY 90 tablet 0   famotidine (PEPCID) 20 MG tablet TAKE 1 TABLET (20 MG TOTAL) BY MOUTH 2 (TWO) TIMES DAILY AS NEEDED FOR HEARTBURN OR INDIGESTION. 180 tablet 1   Flaxseed MISC Take 5 mLs by mouth daily. Take 1 tsp daily     furosemide (LASIX) 20 MG tablet TAKE 1.5 TABLETS BY MOUTH DAILY 135 tablet 3   lidocaine (LMX) 4 % cream Apply 1 application  topically daily as needed (inflammation).     lidocaine 4 % Place 1 patch onto the skin daily.     Magnesium Oxide 250 MG TABS Take 1 tablet by mouth.     metoprolol succinate (TOPROL-XL) 25 MG 24 hr tablet TAKE 1/2 (ONE-HALF) TABLET BY MOUTH ONCE DAILY WITH OR IMMEDIATELY FOLLOWING A MEAL 45 tablet 2   Multiple Vitamin (MULTIVITAMIN) tablet Take 1 tablet by mouth daily.       polyethylene glycol (MIRALAX / GLYCOLAX) packet Take 17 g by mouth daily as needed for moderate constipation.      predniSONE (DELTASONE) 2.5 MG tablet Take 1 tablet (2.5 mg total) by mouth every other day. 45 tablet 3   Probiotic Product (PROBIOTIC DAILY PO) Take 1 tablet by mouth daily with breakfast.     PSYLLIUM PO Take 1 tablet by mouth daily as needed (constipation).     traMADol (ULTRAM) 50 MG tablet Take 1 tablet (50 mg total) by mouth every 6 (six) hours as needed. 10 tablet 0   triamcinolone cream (KENALOG) 0.1 % Apply 1 Application topically 2 (two) times daily as needed (rash). 30 g 0   trolamine salicylate (ASPERCREME) 10 % cream Apply 1 application topically as needed for muscle pain.     Zoledronic Acid (RECLAST IV) Once a year,  last done 07/12/2022     clobetasol cream (TEMOVATE) 0.05 % Apply 1 Application topically 2 (two) times daily. Apply topically to the affected areas twice a day for up to two weeks. 30 g 0   Glucos-Chond-Hyal Ac-Ca Fructo (MOVE FREE JOINT HEALTH ADVANCE PO) Take 1 tablet by mouth daily.     metoprolol succinate (TOPROL-XL) 25 MG 24 hr tablet Take 1 tablet (25 mg total) by mouth daily. 30 tablet 1   No facility-administered medications prior to visit.         Objective:   Physical Exam Vitals:   01/31/23 1344  BP: 136/64  Pulse: 69  SpO2: 94%  Weight: 150 lb 6.4 oz (68.2 kg)  Height: 5\' 1"  (1.549 m)   Gen: Pleasant, elderly woman, in no distress,  normal affect  ENT: No lesions,  mouth clear,  oropharynx clear, no postnasal drip  Neck: No JVD, no stridor  Lungs: No use of accessory muscles, no crackles or wheezing on normal respiration, no wheeze on forced expiration  Cardiovascular: RRR, heart sounds normal, no murmur or gallops, trace peripheral edema  Musculoskeletal: No deformities, no cyanosis or clubbing  Neuro: alert, awake, non focal  Skin: Warm, no lesions or rash       Assessment & Plan:   Abnormal CT of the chest We reviewed her CT scan of the chest that shows stable right upper lobe subsolid nodule compared with her prior but enlarging compared with 2022.  I do suspect that this is a slow-growing adenocarcinoma.  Because it has been stable in size on serial imaging we have been able to defer discussion about either  biopsy or empiric SBRT.  At her age she wants to try and be conservative which I support.  We reviewed your CT scan of the chest today. We will plan to repeat your CT chest in 6 months. We will forward your abdominal CT information to Dr. Rhea Belton Follow Dr. Delton Coombes in 6 months, sooner if you have any problems.    Levy Pupa, MD, PhD 01/31/2023, 4:41 PM [facility] [phone] or if no answer before 7:00PM call  [phone] For any issues after 7:00PM please call eLink [phone]

## 2023-01-31 NOTE — Assessment & Plan Note (Signed)
We reviewed her CT scan of the chest that shows stable right upper lobe subsolid nodule compared with her prior but enlarging compared with 2022.  I do suspect that this is a slow-growing adenocarcinoma.  Because it has been stable in size on serial imaging we have been able to defer discussion about either biopsy or empiric SBRT.  At her age she wants to try and be conservative which I support.  We reviewed your CT scan of the chest today. We will plan to repeat your CT chest in 6 months. We will forward your abdominal CT information to Dr. Rhea Belton Follow Dr. Delton Coombes in 6 months, sooner if you have any problems.

## 2023-01-31 NOTE — Patient Instructions (Signed)
We reviewed your CT scan of the chest today. We will plan to repeat your CT chest in 6 months. We will forward your abdominal CT information to Dr. Rhea Belton Follow Dr. Delton Coombes in 6 months, sooner if you have any problems.

## 2023-02-01 ENCOUNTER — Telehealth: Payer: Self-pay | Admitting: *Deleted

## 2023-02-01 NOTE — Telephone Encounter (Signed)
-----   Message from Carie Caddy Pyrtle sent at 02/01/2023 12:04 PM EDT ----- Karen Kitchens Dr. Delton Coombes had ordered a repeat CT scan of the patient's chest to follow a lung cancer they are observing After her last visit I added on an abdomen and pelvis to evaluate left-sided abdominal pain and left lower back pain Please let her know that from an abdominal perspective everything checked out just fine.  No concerning inflammation or lumps or bumps. I hope she is doing well Carie Caddy. Pyrtle, M.D.  02/01/2023

## 2023-02-01 NOTE — Telephone Encounter (Signed)
Left voicemail for patient to call back. 

## 2023-02-04 ENCOUNTER — Encounter: Payer: Self-pay | Admitting: Internal Medicine

## 2023-02-04 NOTE — Patient Instructions (Incomplete)
      Blood work was ordered.   The lab is on the first floor.    Medications changes include :       A referral was ordered and someone will call you to schedule an appointment.     No follow-ups on file.  

## 2023-02-04 NOTE — Telephone Encounter (Signed)
Patient advised of normal abdomen/pelvis scan with no abdominal abnormalities, inflammation, lumps, bumps etc. She verbalizes understanding.

## 2023-02-04 NOTE — Progress Notes (Unsigned)
Subjective:    Patient ID: Tamara Robertson, female    DOB: 12/30/1929, 89 y.o.   MRN: [id number]     HPI Tamara Robertson is here for follow up of her chronic medical problems.  GERD - not controlled-has appointment next month with GI  Tired - some days she has to take a nap.  Bed 1:30 - 10:30 - occasionally goes to be at 3 am.  She sometimes gets a lot of sleep and other days gets less sleep.  Usually a nap is restorative.  She does not always feel like doing that much between her pain issues and being tired.  Medications and allergies reviewed with patient and updated if appropriate.  Current Outpatient Medications on File Prior to Visit  Medication Sig Dispense Refill   acetaminophen (TYLENOL) 500 MG tablet Take 500 mg by mouth every 6 (six) hours as needed for moderate pain.     ammonium lactate (AMLACTIN) 12 % lotion 1 application Externally Twice a day     Biotin 2500 MCG CAPS Take 1 capsule by mouth daily.      calcium carbonate (TUMS - DOSED IN MG ELEMENTAL CALCIUM) 500 MG chewable tablet Chew 1 tablet by mouth daily as needed for indigestion or heartburn.     cholecalciferol (VITAMIN D3) 25 MCG (1000 UNIT) tablet Take 1,000 Units by mouth daily.     clobetasol ointment (TEMOVATE) 0.05 % Apply 1 Application topically 2 (two) times daily. Apply topically to the affected area for up to two weeks 30 g 0   escitalopram (LEXAPRO) 5 MG tablet TAKE ONE TABLET BY MOUTH ONE TIME DAILY 90 tablet 0   Flaxseed MISC Take 5 mLs by mouth daily. Take 1 tsp daily     furosemide (LASIX) 20 MG tablet TAKE 1.5 TABLETS BY MOUTH DAILY 135 tablet 3   lidocaine (LMX) 4 % cream Apply 1 application  topically daily as needed (inflammation).     lidocaine 4 % Place 1 patch onto the skin daily.     Magnesium Oxide 250 MG TABS Take 1 tablet by mouth.     metoprolol succinate (TOPROL-XL) 25 MG 24 hr tablet TAKE 1/2 (ONE-HALF) TABLET BY MOUTH ONCE DAILY WITH OR IMMEDIATELY FOLLOWING A MEAL 45 tablet 2   Multiple  Vitamin (MULTIVITAMIN) tablet Take 1 tablet by mouth daily.       polyethylene glycol (MIRALAX / GLYCOLAX) packet Take 17 g by mouth daily as needed for moderate constipation.      predniSONE (DELTASONE) 2.5 MG tablet Take 1 tablet (2.5 mg total) by mouth every other day. 45 tablet 3   Probiotic Product (PROBIOTIC DAILY PO) Take 1 tablet by mouth daily with breakfast.     PSYLLIUM PO Take 1 tablet by mouth daily as needed (constipation).     traMADol (ULTRAM) 50 MG tablet Take 1 tablet (50 mg total) by mouth every 6 (six) hours as needed. 10 tablet 0   trolamine salicylate (ASPERCREME) 10 % cream Apply 1 application topically as needed for muscle pain.     No current facility-administered medications on file prior to visit.     Review of Systems  Constitutional:  Positive for fatigue.  Respiratory:  Positive for cough. Negative for wheezing. Shortness of breath: chronic.  Cardiovascular:  Positive for palpitations and leg swelling (controlled). Negative for chest pain.  Neurological:  Negative for light-headedness and headaches.       Objective:   Vitals:   02/05/23 1509  BP:  132/80  Pulse: 80  Temp: 97.9 F (36.6 C)  SpO2: 97%   BP Readings from Last 3 Encounters:  02/05/23 132/80  01/31/23 136/64  01/01/23 136/63   Wt Readings from Last 3 Encounters:  02/05/23 150 lb (68 kg)  01/31/23 150 lb 6.4 oz (68.2 kg)  01/01/23 149 lb (67.6 kg)   Body mass index is 28.34 kg/m.    Physical Exam Constitutional:      General: She is not in acute distress.    Appearance: Normal appearance.  HENT:     Head: Normocephalic and atraumatic.  Eyes:     Conjunctiva/sclera: Conjunctivae normal.  Cardiovascular:     Rate and Rhythm: Normal rate and regular rhythm.     Heart sounds: Murmur (2/6 systolic) heard.  Pulmonary:     Effort: Pulmonary effort is normal. No respiratory distress.     Breath sounds: Normal breath sounds. No wheezing.  Musculoskeletal:     Cervical back:  Neck supple.     Right lower leg: No edema.     Left lower leg: No edema.  Lymphadenopathy:     Cervical: No cervical adenopathy.  Skin:    General: Skin is warm and dry.     Findings: Erythema (Mild erythema bilateral lower extremities secondary to venous stasis) present. No rash.  Neurological:     Mental Status: She is alert. Mental status is at baseline.  Psychiatric:        Mood and Affect: Mood normal.        Behavior: Behavior normal.        Lab Results  Component Value Date   WBC 11.4 (H) 01/23/2023   HGB 13.5 01/23/2023   HCT 41.4 01/23/2023   PLT 175.0 01/23/2023   GLUCOSE 93 12/18/2022   CHOL 175 07/25/2022   TRIG 236.0 (H) 07/25/2022   HDL 44.00 07/25/2022   LDLDIRECT 107.0 07/25/2022   LDLCALC 107 (H) 05/23/2017   ALT 13 07/25/2022   AST 19 07/25/2022   NA 143 12/18/2022   K 4.8 12/18/2022   CL 100 12/18/2022   CREATININE 1.04 (H) 12/18/2022   BUN 20 12/18/2022   CO2 26 12/18/2022   TSH 2.64 07/25/2022   INR 1.1 02/17/2022   HGBA1C 6.2 07/25/2022     Assessment & Plan:    See Problem List for Assessment and Plan of chronic medical problems.

## 2023-02-04 NOTE — Telephone Encounter (Signed)
Patient is returning your call.  

## 2023-02-05 ENCOUNTER — Ambulatory Visit (INDEPENDENT_AMBULATORY_CARE_PROVIDER_SITE_OTHER): Payer: Medicare Other | Admitting: Internal Medicine

## 2023-02-05 VITALS — BP 132/80 | HR 80 | Temp 97.9°F | Ht 61.0 in | Wt 150.0 lb

## 2023-02-05 DIAGNOSIS — K219 Gastro-esophageal reflux disease without esophagitis: Secondary | ICD-10-CM | POA: Diagnosis not present

## 2023-02-05 DIAGNOSIS — D693 Immune thrombocytopenic purpura: Secondary | ICD-10-CM | POA: Diagnosis not present

## 2023-02-05 DIAGNOSIS — F419 Anxiety disorder, unspecified: Secondary | ICD-10-CM | POA: Diagnosis not present

## 2023-02-05 DIAGNOSIS — I10 Essential (primary) hypertension: Secondary | ICD-10-CM

## 2023-02-05 DIAGNOSIS — F32A Depression, unspecified: Secondary | ICD-10-CM

## 2023-02-05 MED ORDER — FAMOTIDINE 20 MG PO TABS: 40.0000 mg | ORAL_TABLET | Freq: Two times a day (BID) | ORAL | Status: DC | PRN

## 2023-02-05 MED ORDER — TRIAMCINOLONE ACETONIDE 0.1 % EX CREA: 1.0000 | TOPICAL_CREAM | Freq: Two times a day (BID) | CUTANEOUS | 0 refills | Status: DC | PRN

## 2023-02-05 NOTE — Assessment & Plan Note (Signed)
Chronic Not ideally controlled Has appointment with GI next month Advised increasing famotidine to 40 mg twice daily-can decrease once GERD is better controlled Okay to continue Tums as needed

## 2023-02-05 NOTE — Assessment & Plan Note (Signed)
Chronic Stable Following with heme-onc Continue prednisone 2.5 mg every other day

## 2023-02-05 NOTE — Assessment & Plan Note (Signed)
Chronic Overall well-controlled Continue Lexapro 5 mg daily

## 2023-02-05 NOTE — Assessment & Plan Note (Signed)
Chronic Blood pressure is well-controlled Continue metoprolol XL 12.5 mg daily 

## 2023-02-06 ENCOUNTER — Other Ambulatory Visit: Payer: Self-pay | Admitting: Internal Medicine

## 2023-02-06 ENCOUNTER — Encounter: Payer: Self-pay | Admitting: Internal Medicine

## 2023-02-06 DIAGNOSIS — Z1231 Encounter for screening mammogram for malignant neoplasm of breast: Secondary | ICD-10-CM

## 2023-02-23 NOTE — Progress Notes (Unsigned)
[facility] OFFICE PROGRESS NOTE  Pincus Sanes, MD [address] [place] [address]  DIAGNOSIS: 1) ITP diagnosed in 2000  2) Likely slow growing lung cancer, following with pulmonary medicine with Dr. Delton Coombes  PRIOR THERAPY: S/P splenectomy and subsequently treated with high doses of steroids. The patient have had a complete response to steroids back in the 60s and all the way have had a few relapses. Every time she has a relapse she gets restarted on high-dose of steroids and she achieved a complete response. The most recent of relapses before her move to [facility] she was hospitalized for a platelet count of 8000 around the year 2000.   CURRENT THERAPY:  Prednisone to 2.5 mg every other day   INTERVAL HISTORY: Tamara Robertson 89 y.o. female returns to the clinic today for a follow up visit. She was previously following with Dr. Clelia Croft for ITP. He sees her on an annual basis. In the interval since last being seen, she has been following with Dr. Delton Coombes from pulmonary for suspected slowly growing adenocarcinoma. They discussed the options such as bronchoscopy, SBRT, vs  observation. She opted to continue on observation and she is expected to have repeat CT and follow up with Dr. Delton Coombes in 6 months.   She also sees GI for some ***. She also has history of IDA due to prior diverticular bleed. She received IV iron in ***. She has poor tolerance to p.o. iron.  Her last feraheme ***  Otherwise, today she is feeling ***. She denies any visible abnormal bleeding or bruising. Her energy is ***. Shortness of breath ***. Lightheadedness ***. She is here for evaluation and repeat blood work.    MEDICAL HISTORY: Past Medical History:  Diagnosis Date   Achilles tendonitis    Acute lower GI bleeding 2023   Anxiety    Aortic atherosclerosis (HCC)    CAD (coronary artery disease)    RCA 40% stenosis cath 01/2011   Diverticular stricture [facility]) 2006   [facility]    DIVERTICULITIS, HX OF    Diverticulosis    DYSLIPIDEMIA    Elevated LFTs    GERD    Glaucoma    Hepatic steatosis    HOH (hard of hearing)    Immune thrombocytopenic purpura (HCC)    chronic - baseline 80-100K, on pred   Irritable bowel syndrome    Left ovarian cyst dx 01/2013 CT   working with gyn, ?malignant - elevated tumor marker OVA1   OSTEOARTHRITIS, KNEE, RIGHT    OSTEOPENIA    OVERACTIVE BLADDER    Plantar fasciitis    Prolapse of female bladder, acquired 11/2017   Torn rotator cuff    right shoulder   URINARY INCONTINENCE     ALLERGIES:  is allergic to aspirin, sulfa antibiotics, contrast media [iodinated contrast media], shellfish-derived products, apixaban, fluoxetine, sulfamethoxazole-trimethoprim, celebrex [celecoxib], celexa [citalopram], and statins.  MEDICATIONS:  Current Outpatient Medications  Medication Sig Dispense Refill   acetaminophen (TYLENOL) 500 MG tablet Take 500 mg by mouth every 6 (six) hours as needed for moderate pain.     ammonium lactate (AMLACTIN) 12 % lotion 1 application Externally Twice a day     Biotin 2500 MCG CAPS Take 1 capsule by mouth daily.      calcium carbonate (TUMS - DOSED IN MG ELEMENTAL CALCIUM) 500 MG chewable tablet Chew 1 tablet by mouth daily as needed for indigestion or heartburn.     cholecalciferol (VITAMIN D3)  25 MCG (1000 UNIT) tablet Take 1,000 Units by mouth daily.     clobetasol ointment (TEMOVATE) 0.05 % Apply 1 Application topically 2 (two) times daily. Apply topically to the affected area for up to two weeks 30 g 0   escitalopram (LEXAPRO) 5 MG tablet TAKE ONE TABLET BY MOUTH ONE TIME DAILY 90 tablet 0   famotidine (PEPCID) 20 MG tablet Take 2 tablets (40 mg total) by mouth 2 (two) times daily as needed for heartburn or indigestion.     Flaxseed MISC Take 5 mLs by mouth daily. Take 1 tsp daily     furosemide (LASIX) 20 MG tablet TAKE 1.5 TABLETS BY MOUTH DAILY 135 tablet 3   lidocaine (LMX) 4 % cream Apply 1  application  topically daily as needed (inflammation).     lidocaine 4 % Place 1 patch onto the skin daily.     Magnesium Oxide 250 MG TABS Take 1 tablet by mouth.     metoprolol succinate (TOPROL-XL) 25 MG 24 hr tablet TAKE 1/2 (ONE-HALF) TABLET BY MOUTH ONCE DAILY WITH OR IMMEDIATELY FOLLOWING A MEAL 45 tablet 2   Multiple Vitamin (MULTIVITAMIN) tablet Take 1 tablet by mouth daily.       polyethylene glycol (MIRALAX / GLYCOLAX) packet Take 17 g by mouth daily as needed for moderate constipation.      predniSONE (DELTASONE) 2.5 MG tablet Take 1 tablet (2.5 mg total) by mouth every other day. 45 tablet 3   Probiotic Product (PROBIOTIC DAILY PO) Take 1 tablet by mouth daily with breakfast.     PSYLLIUM PO Take 1 tablet by mouth daily as needed (constipation).     traMADol (ULTRAM) 50 MG tablet Take 1 tablet (50 mg total) by mouth every 6 (six) hours as needed. 10 tablet 0   triamcinolone cream (KENALOG) 0.1 % Apply 1 Application topically 2 (two) times daily as needed (rash). 30 g 0   trolamine salicylate (ASPERCREME) 10 % cream Apply 1 application topically as needed for muscle pain.     No current facility-administered medications for this visit.    SURGICAL HISTORY:  Past Surgical History:  Procedure Laterality Date   ABDOMINAL HYSTERECTOMY  1963   APPENDECTOMY  1956   BIOPSY  02/18/2022   Procedure: BIOPSY;  Surgeon: Meridee Score Netty Starring., MD;  Location: [facility];  Service: Gastroenterology;;   CARDIAC CATHETERIZATION     CATARACT EXTRACTION, BILATERAL  10/2010   CHOLECYSTECTOMY N/A 09/16/2014   Procedure: LAPAROSCOPIC CHOLECYSTECTOMY ;  Surgeon: Emelia Loron, MD;  Location: [facility] OR;  Service: General;  Laterality: N/A;   COLONOSCOPY N/A 02/19/2022   Procedure: COLONOSCOPY;  Surgeon: Sherrilyn Rist, MD;  Location: [facility];  Service: Gastroenterology;  Laterality: N/A;   ESOPHAGOGASTRODUODENOSCOPY N/A 02/18/2022   Procedure: ESOPHAGOGASTRODUODENOSCOPY (EGD);   Surgeon: Lemar Lofty., MD;  Location: [facility];  Service: Gastroenterology;  Laterality: N/A;   FLEXIBLE SIGMOIDOSCOPY N/A 02/18/2022   Procedure: FLEXIBLE SIGMOIDOSCOPY;  Surgeon: Meridee Score Netty Starring., MD;  Location: [facility];  Service: Gastroenterology;  Laterality: N/A;   HEMOSTASIS CLIP PLACEMENT  02/19/2022   Procedure: HEMOSTASIS CLIP PLACEMENT;  Surgeon: Sherrilyn Rist, MD;  Location: [facility];  Service: Gastroenterology;;   KNEE ARTHROSCOPY Right    L pop PTA  10/2009   stent   LAPAROSCOPIC SIGMOID COLECTOMY  10/2005   SPLENECTOMY  1954   VARICOSE VEIN SURGERY Right 1962    REVIEW OF SYSTEMS:   Review of Systems  Constitutional: Negative for appetite  change, chills, fatigue, fever and unexpected weight change.  HENT:   Negative for mouth sores, nosebleeds, sore throat and trouble swallowing.   Eyes: Negative for eye problems and icterus.  Respiratory: Negative for cough, hemoptysis, shortness of breath and wheezing.   Cardiovascular: Negative for chest pain and leg swelling.  Gastrointestinal: Negative for abdominal pain, constipation, diarrhea, nausea and vomiting.  Genitourinary: Negative for bladder incontinence, difficulty urinating, dysuria, frequency and hematuria.   Musculoskeletal: Negative for back pain, gait problem, neck pain and neck stiffness.  Skin: Negative for itching and rash.  Neurological: Negative for dizziness, extremity weakness, gait problem, headaches, light-headedness and seizures.  Hematological: Negative for adenopathy. Does not bruise/bleed easily.  Psychiatric/Behavioral: Negative for confusion, depression and sleep disturbance. The patient is not nervous/anxious.     PHYSICAL EXAMINATION:  There were no vitals taken for this visit.  ECOG PERFORMANCE STATUS: {CHL ONC ECOG [id number]  Physical Exam  Constitutional: Oriented to person, place, and time and well-developed, well-nourished, and in no distress. No  distress.  HENT:  Head: Normocephalic and atraumatic.  Mouth/Throat: Oropharynx is clear and moist. No oropharyngeal exudate.  Eyes: Conjunctivae are normal. Right eye exhibits no discharge. Left eye exhibits no discharge. No scleral icterus.  Neck: Normal range of motion. Neck supple.  Cardiovascular: Normal rate, regular rhythm, normal heart sounds and intact distal pulses.   Pulmonary/Chest: Effort normal and breath sounds normal. No respiratory distress. No wheezes. No rales.  Abdominal: Soft. Bowel sounds are normal. Exhibits no distension and no mass. There is no tenderness.  Musculoskeletal: Normal range of motion. Exhibits no edema.  Lymphadenopathy:    No cervical adenopathy.  Neurological: Alert and oriented to person, place, and time. Exhibits normal muscle tone. Gait normal. Coordination normal.  Skin: Skin is warm and dry. No rash noted. Not diaphoretic. No erythema. No pallor.  Psychiatric: Mood, memory and judgment normal.  Vitals reviewed.  LABORATORY DATA: Lab Results  Component Value Date   WBC 11.4 (H) 01/23/2023   HGB 13.5 01/23/2023   HCT 41.4 01/23/2023   MCV 85.3 01/23/2023   PLT 175.0 01/23/2023      Chemistry      Component Value Date/Time   NA 143 12/18/2022 1552   NA 142 02/16/2016 1526   K 4.8 12/18/2022 1552   K 4.3 02/16/2016 1526   CL 100 12/18/2022 1552   CL 105 04/17/2012 1452   CO2 26 12/18/2022 1552   CO2 27 02/16/2016 1526   BUN 20 12/18/2022 1552   BUN 14.3 02/16/2016 1526   CREATININE 1.04 (H) 12/18/2022 1552   CREATININE 0.89 02/27/2022 1428   CREATININE 0.82 01/20/2020 1525   CREATININE 0.8 02/16/2016 1526      Component Value Date/Time   CALCIUM 9.6 12/18/2022 1552   CALCIUM 9.5 02/16/2016 1526   ALKPHOS 74 07/25/2022 1619   ALKPHOS 92 02/16/2016 1526   AST 19 07/25/2022 1619   AST 19 02/27/2022 1428   AST 19 02/16/2016 1526   ALT 13 07/25/2022 1619   ALT 15 02/27/2022 1428   ALT 11 02/16/2016 1526   BILITOT 0.4  07/25/2022 1619   BILITOT 0.3 02/27/2022 1428   BILITOT 0.33 02/16/2016 1526       RADIOGRAPHIC STUDIES:  No results found.   ASSESSMENT/PLAN:  Chronic ITP diagnosed in 2000.  She continues to be in remission without any evidence of relapse or requirement for treatment.   She is on prednisone 2.5 mg ***  She was previously followed by Dr.  Shadad  She is here for evaluation and to establish care with Dr. Arbutus Ped. Labs were reviewed. Reccommended ***  We will see her back for labs and *** in ***  She will continue to follow with Dr. Delton Coombes for monitoring of her suspected ***  Dr. Clelia Croft previously recommended *** IVIG, rituximab among others were reiterated and these will be deferred if needed in the future.  I recommended continuing maintenance prednisone low-dose at this time.   ***iron ***Her last IV iron was with feraheme 03/21/22  The patient was advised to call immediately if she has any concerning symptoms in the interval. The patient voices understanding of current disease status and treatment options and is in agreement with the current care plan. All questions were answered. The patient knows to call the clinic with any problems, questions or concerns. We can certainly see the patient much sooner if necessary      No orders of the defined types were placed in this encounter.    I spent {CHL ONC TIME VISIT - VWUJW:[id number]} counseling the patient face to face. The total time spent in the appointment was {CHL ONC TIME VISIT - AOZHY:[id number]}.  Tao Satz L Maxx Calaway, PA-C 02/23/23

## 2023-02-26 ENCOUNTER — Inpatient Hospital Stay (HOSPITAL_BASED_OUTPATIENT_CLINIC_OR_DEPARTMENT_OTHER): Payer: Medicare Other | Admitting: Physician Assistant

## 2023-02-26 ENCOUNTER — Other Ambulatory Visit: Payer: Self-pay | Admitting: Physician Assistant

## 2023-02-26 ENCOUNTER — Inpatient Hospital Stay: Payer: Medicare Other | Attending: Physician Assistant

## 2023-02-26 VITALS — BP 168/73 | HR 72 | Temp 98.1°F | Resp 15 | Wt 149.5 lb

## 2023-02-26 DIAGNOSIS — Z862 Personal history of diseases of the blood and blood-forming organs and certain disorders involving the immune mechanism: Secondary | ICD-10-CM | POA: Diagnosis not present

## 2023-02-26 DIAGNOSIS — D693 Immune thrombocytopenic purpura: Secondary | ICD-10-CM

## 2023-02-26 DIAGNOSIS — Z7952 Long term (current) use of systemic steroids: Secondary | ICD-10-CM | POA: Diagnosis not present

## 2023-02-26 LAB — CBC WITH DIFFERENTIAL (CANCER CENTER ONLY)
Abs Immature Granulocytes: 0.02 10*3/uL (ref 0.00–0.07)
Basophils Absolute: 0.1 10*3/uL (ref 0.0–0.1)
Basophils Relative: 1 %
Eosinophils Absolute: 0.2 10*3/uL (ref 0.0–0.5)
Eosinophils Relative: 2 %
HCT: 39.6 % (ref 36.0–46.0)
Hemoglobin: 13.2 g/dL (ref 12.0–15.0)
Immature Granulocytes: 0 %
Lymphocytes Relative: 32 %
Lymphs Abs: 3 10*3/uL (ref 0.7–4.0)
MCH: 28.5 pg (ref 26.0–34.0)
MCHC: 33.3 g/dL (ref 30.0–36.0)
MCV: 85.5 fL (ref 80.0–100.0)
Monocytes Absolute: 1.4 10*3/uL — ABNORMAL HIGH (ref 0.1–1.0)
Monocytes Relative: 14 %
Neutro Abs: 4.8 10*3/uL (ref 1.7–7.7)
Neutrophils Relative %: 51 %
Platelet Count: 168 10*3/uL (ref 150–400)
RBC: 4.63 MIL/uL (ref 3.87–5.11)
RDW: 14.5 % (ref 11.5–15.5)
WBC Count: 9.5 10*3/uL (ref 4.0–10.5)
nRBC: 0 % (ref 0.0–0.2)

## 2023-02-26 LAB — CMP (CANCER CENTER ONLY)
ALT: 15 U/L (ref 0–44)
AST: 22 U/L (ref 15–41)
Albumin: 4.1 g/dL (ref 3.5–5.0)
Alkaline Phosphatase: 58 U/L (ref 38–126)
Anion gap: 5 (ref 5–15)
BUN: 21 mg/dL (ref 8–23)
CO2: 32 mmol/L (ref 22–32)
Calcium: 9.6 mg/dL (ref 8.9–10.3)
Chloride: 103 mmol/L (ref 98–111)
Creatinine: 0.9 mg/dL (ref 0.44–1.00)
GFR, Estimated: >60 mL/min (ref >60)
Glucose, Bld: 103 mg/dL — ABNORMAL HIGH (ref 70–99)
Potassium: 4 mmol/L (ref 3.5–5.1)
Sodium: 140 mmol/L (ref 135–145)
Total Bilirubin: 0.5 mg/dL (ref 0.3–1.2)
Total Protein: 7.3 g/dL (ref 6.5–8.1)

## 2023-02-26 LAB — LACTATE DEHYDROGENASE: LDH: 135 U/L (ref 98–192)

## 2023-02-28 ENCOUNTER — Ambulatory Visit: Payer: Medicare Other | Admitting: Dermatology

## 2023-03-01 ENCOUNTER — Other Ambulatory Visit: Payer: Self-pay | Admitting: Physician Assistant

## 2023-03-01 DIAGNOSIS — R911 Solitary pulmonary nodule: Secondary | ICD-10-CM

## 2023-03-01 NOTE — Progress Notes (Signed)
I called the patient to let her know that I reach out to Dr. Delton Coombes who would be in agreement with SBRT if the patient wanted to pursue that route for the nodules in the lung that are being monitored.  I called the patient.  She is still deciding between surveillance imaging every 6 months vs SBRT. However, she would like to move forward with scheduling a consultation with radiation oncology to discuss the pros and cons and potential side effects of SBRT to help her make an informed decision.  I will go ahead and place that referral.

## 2023-03-04 DIAGNOSIS — C3411 Malignant neoplasm of upper lobe, right bronchus or lung: Secondary | ICD-10-CM | POA: Insufficient documentation

## 2023-03-04 NOTE — Progress Notes (Signed)
Thoracic Location of Tumor / Histology: RUL Lung Nodule  Patient has been followed by pulmonary for right lung nodules since 2022.  Imaging obtained 01/2023 showed gradual progressive changes in the right upper lobe nodule.   Past/Anticipated interventions by cardiothoracic surgery, if any:  -No surgery or Biopsy.   Past/Anticipated interventions by medical oncology, if any:  -No plan for systemic therapy. -Patient weighing surveillance imaging every 6 months versus SBRT.   Tobacco/Marijuana/Snuff/ETOH use: Non Smoker   Signs/Symptoms Weight changes, if any: No Respiratory complaints, if any: She reports SOB with activities and occasionally at night when she lays down to sleep. Hemoptysis, if any: She reports some coughing, was told by her PCP it was GERD related.  Very rarely is the cough productive.  Denies hemoptysis. Pain issues, if any: No   SAFETY ISSUES: Prior radiation? No Pacemaker/ICD? No Possible current pregnancy? Hysterectomy  Is the patient on methotrexate? No  Current Complaints / other details:

## 2023-03-04 NOTE — Progress Notes (Signed)
Radiation Oncology         [phone] ________________________________  Name: Tamara Robertson        MRN: [id number]  Date of Service: 03/05/2023 DOB: [birth year]  NW:GNFAO, Bobette Mo, MD  Si Gaul, MD     REFERRING PHYSICIAN: Si Gaul, MD   DIAGNOSIS: The encounter diagnosis was Malignant neoplasm of upper lobe of right lung [facility]).   HISTORY OF PRESENT ILLNESS: Tamara Robertson is a 89 y.o. female seen at the request of Dr. Arbutus Ped for a putative lung cancer.  The patient has a history of ITP and had been managed by Dr. Clelia Croft in the past and is maintained on oral prednisone.  She has been followed by Dr. Delton Coombes and the team at [facility] pulmonary for  a pulmonary nodule.  It appears this originally came to the attention of her care team in 2022 for scattered irregular opacities by CT imaging 06/15/2021 scan showed an irregular subsolid nodular opacity in the anterior right upper lobe measuring 2.1 cm and medial right lower lobe measuring 1.4 cm, there were other unchanged opacities in the bilateral apices and bandlike fibrotic scarring and volume loss in the left upper lobe.  A PET scan on 08/01/2021 showed hypermetabolic activity in the right upper lobe compatible with concern for disease this was in the anterior lesion, there was increasing thickening and peripheral groundglass changes in the left lower lobe.  It appears that this was felt to be inflammatory in nature so she had repeat imaging on 01/23/2022 with super D CT scan that showed the spiculated nodule in the right upper lobe seeming to be more contracted and solid in appearance and stable additional pulmonary nodules that had previously been described.  She continued with additional imaging and on 07/09/2022 CT super D scan showed stable appearance in the left lower lobe airspace opacity and within the other remaining nodules.  She returned for repeat on 01/23/2023 which showed gradually progressive change in the right upper lobe nodule in  comparison to the prior findings and it was recommended that this be followed and or considered for definitive treatment.  She met with medical oncology who supports the role of treating this as putative versus proceeding with biopsy.  She is seen to discuss what this would entail.    PREVIOUS RADIATION THERAPY: {EXAM; YES/NO:19492::"No"}   PAST MEDICAL HISTORY:  Past Medical History:  Diagnosis Date   Achilles tendonitis    Acute lower GI bleeding 2023   Anxiety    Aortic atherosclerosis (HCC)    CAD (coronary artery disease)    RCA 40% stenosis cath 01/2011   Diverticular stricture [facility]) 2006   [facility]   DIVERTICULITIS, HX OF    Diverticulosis    DYSLIPIDEMIA    Elevated LFTs    GERD    Glaucoma    Hepatic steatosis    HOH (hard of hearing)    Immune thrombocytopenic purpura (HCC)    chronic - baseline 80-100K, on pred   Irritable bowel syndrome    Left ovarian cyst dx 01/2013 CT   working with gyn, ?malignant - elevated tumor marker OVA1   OSTEOARTHRITIS, KNEE, RIGHT    OSTEOPENIA    OVERACTIVE BLADDER    Plantar fasciitis    Prolapse of female bladder, acquired 11/2017   Torn rotator cuff    right shoulder   URINARY INCONTINENCE        PAST SURGICAL HISTORY: Past Surgical History:  Procedure Laterality Date  ABDOMINAL HYSTERECTOMY  1963   APPENDECTOMY  1956   BIOPSY  02/18/2022   Procedure: BIOPSY;  Surgeon: Meridee Score Netty Starring., MD;  Location: [facility];  Service: Gastroenterology;;   CARDIAC CATHETERIZATION     CATARACT EXTRACTION, BILATERAL  10/2010   CHOLECYSTECTOMY N/A 09/16/2014   Procedure: LAPAROSCOPIC CHOLECYSTECTOMY ;  Surgeon: Emelia Loron, MD;  Location: [facility] OR;  Service: General;  Laterality: N/A;   COLONOSCOPY N/A 02/19/2022   Procedure: COLONOSCOPY;  Surgeon: Sherrilyn Rist, MD;  Location: [facility];  Service: Gastroenterology;  Laterality: N/A;   ESOPHAGOGASTRODUODENOSCOPY N/A 02/18/2022   Procedure:  ESOPHAGOGASTRODUODENOSCOPY (EGD);  Surgeon: Lemar Lofty., MD;  Location: [facility];  Service: Gastroenterology;  Laterality: N/A;   FLEXIBLE SIGMOIDOSCOPY N/A 02/18/2022   Procedure: FLEXIBLE SIGMOIDOSCOPY;  Surgeon: Meridee Score Netty Starring., MD;  Location: [facility];  Service: Gastroenterology;  Laterality: N/A;   HEMOSTASIS CLIP PLACEMENT  02/19/2022   Procedure: HEMOSTASIS CLIP PLACEMENT;  Surgeon: Sherrilyn Rist, MD;  Location: [facility];  Service: Gastroenterology;;   KNEE ARTHROSCOPY Right    L pop PTA  10/2009   stent   LAPAROSCOPIC SIGMOID COLECTOMY  10/2005   SPLENECTOMY  1954   VARICOSE VEIN SURGERY Right 1962     FAMILY HISTORY:  Family History  Problem Relation Age of Onset   Coronary artery disease Mother    Heart attack Mother 56   Hyperlipidemia Mother    Hypertension Mother    Stomach cancer Father    Hypertension Daughter    Hyperlipidemia Daughter    Arthritis Other        parent   Transient ischemic attack Other        parent     SOCIAL HISTORY:  reports that she has never smoked. She has never used smokeless tobacco. She reports that she does not drink alcohol and does not use drugs.   ALLERGIES: Aspirin, Sulfa antibiotics, Contrast media [iodinated contrast media], Shellfish-derived products, Apixaban, Fluoxetine, Sulfamethoxazole-trimethoprim, Celebrex [celecoxib], Celexa [citalopram], and Statins   MEDICATIONS:  Current Outpatient Medications  Medication Sig Dispense Refill   acetaminophen (TYLENOL) 500 MG tablet Take 500 mg by mouth every 6 (six) hours as needed for moderate pain.     ammonium lactate (AMLACTIN) 12 % lotion 1 application Externally Twice a day     Biotin 2500 MCG CAPS Take 1 capsule by mouth daily.      calcium carbonate (TUMS - DOSED IN MG ELEMENTAL CALCIUM) 500 MG chewable tablet Chew 1 tablet by mouth daily as needed for indigestion or heartburn.     cholecalciferol (VITAMIN D3) 25 MCG (1000 UNIT) tablet  Take 1,000 Units by mouth daily.     clobetasol ointment (TEMOVATE) 0.05 % Apply 1 Application topically 2 (two) times daily. Apply topically to the affected area for up to two weeks 30 g 0   escitalopram (LEXAPRO) 5 MG tablet TAKE ONE TABLET BY MOUTH ONE TIME DAILY 90 tablet 0   famotidine (PEPCID) 20 MG tablet Take 2 tablets (40 mg total) by mouth 2 (two) times daily as needed for heartburn or indigestion.     Flaxseed MISC Take 5 mLs by mouth daily. Take 1 tsp daily     furosemide (LASIX) 20 MG tablet TAKE 1.5 TABLETS BY MOUTH DAILY 135 tablet 3   lidocaine (LMX) 4 % cream Apply 1 application  topically daily as needed (inflammation).     lidocaine 4 % Place 1 patch onto the skin daily.  Magnesium Oxide 250 MG TABS Take 1 tablet by mouth.     metoprolol succinate (TOPROL-XL) 25 MG 24 hr tablet TAKE 1/2 (ONE-HALF) TABLET BY MOUTH ONCE DAILY WITH OR IMMEDIATELY FOLLOWING A MEAL 45 tablet 2   Multiple Vitamin (MULTIVITAMIN) tablet Take 1 tablet by mouth daily.       polyethylene glycol (MIRALAX / GLYCOLAX) packet Take 17 g by mouth daily as needed for moderate constipation.      predniSONE (DELTASONE) 2.5 MG tablet Take 1 tablet (2.5 mg total) by mouth every other day. 45 tablet 3   Probiotic Product (PROBIOTIC DAILY PO) Take 1 tablet by mouth daily with breakfast.     PSYLLIUM PO Take 1 tablet by mouth daily as needed (constipation).     traMADol (ULTRAM) 50 MG tablet Take 1 tablet (50 mg total) by mouth every 6 (six) hours as needed. 10 tablet 0   triamcinolone cream (KENALOG) 0.1 % Apply 1 Application topically 2 (two) times daily as needed (rash). 30 g 0   trolamine salicylate (ASPERCREME) 10 % cream Apply 1 application topically as needed for muscle pain.     No current facility-administered medications for this visit.     REVIEW OF SYSTEMS: On review of systems, the patient reports that *** is doing well overall. *** denies any chest pain, shortness of breath, cough, fevers, chills,  night sweats, unintended weight changes. *** denies any bowel or bladder disturbances, and denies abdominal pain, nausea or vomiting. *** denies any new musculoskeletal or joint aches or pains. A complete review of systems is obtained and is otherwise negative.     PHYSICAL EXAM:  Wt Readings from Last 3 Encounters:  02/26/23 149 lb 8 oz (67.8 kg)  02/05/23 150 lb (68 kg)  01/31/23 150 lb 6.4 oz (68.2 kg)   Temp Readings from Last 3 Encounters:  02/26/23 98.1 F (36.7 C) (Oral)  02/05/23 97.9 F (36.6 C) (Oral)  01/01/23 97.9 F (36.6 C)   BP Readings from Last 3 Encounters:  02/26/23 (!) 168/73  02/05/23 132/80  01/31/23 136/64   Pulse Readings from Last 3 Encounters:  02/26/23 72  02/05/23 80  01/31/23 69    /10  In general this is a well appearing *** in no acute distress. ***'s alert and oriented x4 and appropriate throughout the examination. Cardiopulmonary assessment is negative for acute distress and *** exhibits normal effort.     ECOG = ***  0 - Asymptomatic (Fully active, able to carry on all predisease activities without restriction)  1 - Symptomatic but completely ambulatory (Restricted in physically strenuous activity but ambulatory and able to carry out work of a light or sedentary nature. For example, light housework, office work)  2 - Symptomatic, <50% in bed during the day (Ambulatory and capable of all self care but unable to carry out any work activities. Up and about more than 50% of waking hours)  3 - Symptomatic, >50% in bed, but not bedbound (Capable of only limited self-care, confined to bed or chair 50% or more of waking hours)  4 - Bedbound (Completely disabled. Cannot carry on any self-care. Totally confined to bed or chair)  5 - Death   Santiago Glad MM, Creech RH, Tormey DC, et al. [phone]). "Toxicity and response criteria of the [facility]". Am. Evlyn Clines. Oncol. 5 (6): 649-55    LABORATORY DATA:  Lab Results  Component  Value Date   WBC 9.5 02/26/2023   HGB 13.2 02/26/2023  HCT 39.6 02/26/2023   MCV 85.5 02/26/2023   PLT 168 02/26/2023   Lab Results  Component Value Date   NA 140 02/26/2023   K 4.0 02/26/2023   CL 103 02/26/2023   CO2 32 02/26/2023   Lab Results  Component Value Date   ALT 15 02/26/2023   AST 22 02/26/2023   ALKPHOS 58 02/26/2023   BILITOT 0.5 02/26/2023      RADIOGRAPHY: No results found.     IMPRESSION/PLAN: 1. Putative Stage IA2, cT1bN0M0, NSCLC of the RUL. Dr. Mitzi Hansen discusses the pathology findings and reviews the nature of *** Dr. Mitzi Hansen reviews that the standard of care is for biopsy as well as surgical resection. However for patients who are not medical candidates to undergo surgery, or who choose to forgo surgery, stereotactic body radiotherapy (SBRT) is an appropriate alternative. He discusses the limitations of treating without tissue, but after we discussed the considerations of this, the patient is open to considering definitive radiation without a biopsy. We discussed the risks, benefits, short, and long term effects of radiotherapy, as well as the curative intent, and the patient is interested in proceeding. Dr. Mitzi Hansen discusses the delivery and logistics of radiotherapy and anticipates a course of 3-5 fractions of radiation. Written consent is obtained and placed in the chart, a copy was provided to the patient. She will simulate ***   In a visit lasting *** minutes, greater than 50% of the time was spent face to face discussing the patient's condition, in preparation for the discussion, and coordinating the patient's care.   The above documentation reflects my direct findings during this shared patient visit. Please see the separate note by Dr. Mitzi Hansen on this date for the remainder of the patient's plan of care.    Osker Mason, [facility]   **Disclaimer: This note was dictated with voice recognition software. Similar sounding words can inadvertently be transcribed  and this note may contain transcription errors which may not have been corrected upon publication of note.**

## 2023-03-05 ENCOUNTER — Encounter: Payer: Self-pay | Admitting: Radiation Oncology

## 2023-03-05 ENCOUNTER — Ambulatory Visit
Admission: RE | Admit: 2023-03-05 | Discharge: 2023-03-05 | Disposition: A | Discharge status: Home / Self Care | Payer: Medicare Other | Source: Ambulatory Visit | Attending: Radiation Oncology | Admitting: Radiation Oncology

## 2023-03-05 VITALS — BP 163/60 | HR 75 | Temp 97.6°F | Resp 20 | Ht 61.0 in | Wt 148.4 lb

## 2023-03-05 DIAGNOSIS — C3411 Malignant neoplasm of upper lobe, right bronchus or lung: Secondary | ICD-10-CM

## 2023-03-05 DIAGNOSIS — K760 Fatty (change of) liver, not elsewhere classified: Secondary | ICD-10-CM | POA: Diagnosis not present

## 2023-03-05 DIAGNOSIS — Z800 Family history of malignant neoplasm of digestive organs: Secondary | ICD-10-CM | POA: Insufficient documentation

## 2023-03-05 DIAGNOSIS — E785 Hyperlipidemia, unspecified: Secondary | ICD-10-CM | POA: Diagnosis not present

## 2023-03-05 DIAGNOSIS — I2510 Atherosclerotic heart disease of native coronary artery without angina pectoris: Secondary | ICD-10-CM | POA: Diagnosis not present

## 2023-03-05 DIAGNOSIS — Z79899 Other long term (current) drug therapy: Secondary | ICD-10-CM | POA: Insufficient documentation

## 2023-03-05 DIAGNOSIS — K589 Irritable bowel syndrome without diarrhea: Secondary | ICD-10-CM | POA: Insufficient documentation

## 2023-03-05 DIAGNOSIS — M8580 Other specified disorders of bone density and structure, unspecified site: Secondary | ICD-10-CM | POA: Diagnosis not present

## 2023-03-05 DIAGNOSIS — N3281 Overactive bladder: Secondary | ICD-10-CM | POA: Insufficient documentation

## 2023-03-05 DIAGNOSIS — D693 Immune thrombocytopenic purpura: Secondary | ICD-10-CM | POA: Insufficient documentation

## 2023-03-05 DIAGNOSIS — K219 Gastro-esophageal reflux disease without esophagitis: Secondary | ICD-10-CM | POA: Insufficient documentation

## 2023-03-06 ENCOUNTER — Encounter: Payer: Self-pay | Admitting: Radiation Oncology

## 2023-03-09 ENCOUNTER — Other Ambulatory Visit: Payer: Self-pay | Admitting: Internal Medicine

## 2023-03-12 ENCOUNTER — Ambulatory Visit: Payer: Medicare Other | Admitting: Internal Medicine

## 2023-03-13 ENCOUNTER — Ambulatory Visit
Admission: RE | Admit: 2023-03-13 | Discharge: 2023-03-13 | Disposition: A | Discharge status: Home / Self Care | Payer: Medicare Other | Source: Ambulatory Visit | Attending: Internal Medicine | Admitting: Internal Medicine

## 2023-03-13 ENCOUNTER — Encounter: Payer: Self-pay | Admitting: Radiation Oncology

## 2023-03-13 DIAGNOSIS — Z1231 Encounter for screening mammogram for malignant neoplasm of breast: Secondary | ICD-10-CM

## 2023-03-19 ENCOUNTER — Ambulatory Visit
Admission: RE | Admit: 2023-03-19 | Discharge: 2023-03-19 | Disposition: A | Discharge status: Home / Self Care | Payer: Medicare Other | Source: Ambulatory Visit | Attending: Radiation Oncology | Admitting: Radiation Oncology

## 2023-03-19 DIAGNOSIS — C3411 Malignant neoplasm of upper lobe, right bronchus or lung: Secondary | ICD-10-CM | POA: Insufficient documentation

## 2023-03-19 NOTE — Addendum Note (Signed)
Encounter addended by: Dorothy Puffer, MD on: 03/19/2023 8:29 AM  Actions taken: Edit attestation on clinical note

## 2023-03-25 ENCOUNTER — Ambulatory Visit (INDEPENDENT_AMBULATORY_CARE_PROVIDER_SITE_OTHER): Payer: Medicare Other | Admitting: Internal Medicine

## 2023-03-25 VITALS — BP 118/66 | HR 62 | Ht 61.0 in | Wt 150.2 lb

## 2023-03-25 DIAGNOSIS — K5909 Other constipation: Secondary | ICD-10-CM | POA: Diagnosis not present

## 2023-03-25 DIAGNOSIS — R197 Diarrhea, unspecified: Secondary | ICD-10-CM | POA: Diagnosis not present

## 2023-03-25 DIAGNOSIS — M549 Dorsalgia, unspecified: Secondary | ICD-10-CM

## 2023-03-25 DIAGNOSIS — R1013 Epigastric pain: Secondary | ICD-10-CM | POA: Diagnosis not present

## 2023-03-25 DIAGNOSIS — E611 Iron deficiency: Secondary | ICD-10-CM

## 2023-03-25 DIAGNOSIS — K219 Gastro-esophageal reflux disease without esophagitis: Secondary | ICD-10-CM

## 2023-03-25 DIAGNOSIS — K5730 Diverticulosis of large intestine without perforation or abscess without bleeding: Secondary | ICD-10-CM

## 2023-03-25 DIAGNOSIS — C3491 Malignant neoplasm of unspecified part of right bronchus or lung: Secondary | ICD-10-CM

## 2023-03-25 DIAGNOSIS — R1030 Lower abdominal pain, unspecified: Secondary | ICD-10-CM

## 2023-03-25 NOTE — Patient Instructions (Addendum)
_______________________________________________________  Tamara Robertson but add a large tablespoon of metamucil.   Continue famotidine 20mg  twice daily  Please avoid imodium unless absolutely needed.   If your blood pressure at your visit was 140/90 or greater, please contact your primary care physician to follow up on this.  _______________________________________________________  If you are age 87 or older, your body mass index should be between 23-30. Your Body mass index is 28.38 kg/m. If this is out of the aforementioned range listed, please consider follow up with your Primary Care Provider.  If you are age 81 or younger, your body mass index should be between 19-25. Your Body mass index is 28.38 kg/m. If this is out of the aformentioned range listed, please consider follow up with your Primary Care Provider.   ________________________________________________________  The [facility] providers would like to encourage you to use [facility] to communicate with providers for non-urgent requests or questions.  Due to long hold times on the telephone, sending your provider a message by [facility] may be a faster and more efficient way to get a response.  Please allow 48 business hours for a response.  Please remember that this is for non-urgent requests.  _______________________________________________________ It was a pleasure to see you today!  Thank you for trusting me with your gastrointestinal care!

## 2023-03-25 NOTE — Progress Notes (Signed)
Subjective:    Patient ID: Tamara Robertson, female    DOB: [birth year], 89 y.o.   MRN: [id number]  HPI Carollyn Schults is a 89 year old female with a history of lower GI bleed secondary to diverticulosis, history of GERD, IBS with chronic constipation, history of complicated diverticular disease requiring sigmoidectomy in 2007 for stricture, prior SBO felt secondary to adhesive disease, peripheral vascular disease, history of ITP, prior cholecystectomy and splenectomy, bronchogenic carcinoma of the right lung who is seen for follow-up.  She is here alone today and I last saw her on 11/21/2022.  She reports that on the whole she is feeling okay.  She still having episodes of constipation followed by "blowouts" where she will have a urgent loose stools.  This does follow constipation hard pellet-like stools.  She will then use Imodium and have no bowel movement for 3 days.  She has been using MiraLAX daily.  Since her last visit she saw Dr. Lawerance Bach and was having an exacerbation of her indigestion and heartburn despite famotidine 20 mg once daily.  She is subsequently gone to twice daily dosing and symptoms have completely resolved.  She recently had cross-sectional imaging and the presumed bronchogenic carcinoma has increased in size.  She has been seen by Dr. Mitzi Hansen and external radiation therapy has been recommended for 3-5 doses.  This is tentatively scheduled to begin on 04/01/2023.  She is seeking advice today.  Her daughter Larita Fife feels like she should observe and leave it alone.  The patient feels more inclined to have the treatment because she does not want to die of cancer or suffer with metastatic cancer.  Her husband died of cancer over a 76-month.  Which she calls "lucky" because he did not have to suffer longer than 3 months but she does not want that to be her demise.   Review of Systems As per HPI, otherwise negative  Current Medications, Allergies, Past Medical History, Past Surgical History, Family  History and Social History were reviewed in [employer] record.    Objective:   Physical Exam     Latest Ref Rng & Units 02/26/2023    2:30 PM 01/23/2023    3:48 PM 10/12/2022    4:15 PM  CBC  WBC 4.0 - 10.5 K/uL 9.5  11.4  11.3   Hemoglobin 12.0 - 15.0 g/dL 19.1  47.8  29.5   Hematocrit 36.0 - 46.0 % 39.6  41.4  39.8   Platelets 150 - 400 K/uL 168  175.0  178.0    Iron/TIBC/Ferritin/ %Sat    Component Value Date/Time   IRON 74 01/23/2023 1548   TIBC 359.8 01/23/2023 1548   FERRITIN 48.0 01/23/2023 1548   IRONPCTSAT 20.6 01/23/2023 1548   CMP     Component Value Date/Time   NA 140 02/26/2023 1430   NA 143 12/18/2022 1552   NA 142 02/16/2016 1526   K 4.0 02/26/2023 1430   K 4.3 02/16/2016 1526   CL 103 02/26/2023 1430   CL 105 04/17/2012 1452   CO2 32 02/26/2023 1430   CO2 27 02/16/2016 1526   GLUCOSE 103 (H) 02/26/2023 1430   GLUCOSE 184 (H) 02/16/2016 1526   GLUCOSE 122 (H) 04/17/2012 1452   BUN 21 02/26/2023 1430   BUN 20 12/18/2022 1552   BUN 14.3 02/16/2016 1526   CREATININE 0.90 02/26/2023 1430   CREATININE 0.82 01/20/2020 1525   CREATININE 0.8 02/16/2016 1526   CALCIUM 9.6 02/26/2023 1430   CALCIUM 9.5  02/16/2016 1526   PROT 7.3 02/26/2023 1430   PROT 7.4 02/16/2016 1526   ALBUMIN 4.1 02/26/2023 1430   ALBUMIN 3.7 02/16/2016 1526   AST 22 02/26/2023 1430   AST 19 02/16/2016 1526   ALT 15 02/26/2023 1430   ALT 11 02/16/2016 1526   ALKPHOS 58 02/26/2023 1430   ALKPHOS 92 02/16/2016 1526   BILITOT 0.5 02/26/2023 1430   BILITOT 0.33 02/16/2016 1526   GFR 61.87 07/25/2022 1619   EGFR 51 (L) 12/18/2022 1552   GFRNONAA >60 02/26/2023 1430   GFRNONAA 64 01/20/2020 1525    CT CHEST, ABDOMEN AND PELVIS WITHOUT CONTRAST   TECHNIQUE: Multidetector CT imaging of the chest, abdomen and pelvis was performed following the standard protocol without IV contrast.   RADIATION DOSE REDUCTION: This exam was performed according to  the departmental dose-optimization program which includes automated exposure control, adjustment of the mA and/or kV according to patient size and/or use of iterative reconstruction technique.   COMPARISON:  CT abdomen pelvis 11/02/2022 and CT chest 07/09/2022, 01/23/2022, 03/07/2021. PET 08/01/2021.   FINDINGS: CT CHEST FINDINGS   Cardiovascular: Atherosclerotic calcification of the aorta, aortic valve and coronary arteries. Heart is at the upper limits of normal in size. No pericardial effusion.   Mediastinum/Nodes: No pathologically enlarged mediastinal or axillary lymph nodes. Hilar regions are difficult to definitively evaluate without IV contrast. Esophagus is grossly unremarkable.   Lungs/Pleura: Upper and midlung zone predominant areas of mixed consolidation and ground-glass, unchanged from 03/07/2021. Index mass in the anterior segment right upper lobe overall measures 1.5 x 3.6 cm with an internal 0.8 x 1.0 cm solid component (5/45), stable from 07/09/2022 but overall increased in size from 03/07/2021, at which time it measured 1.3 x 1.9 cm and with a questionable internal solid component. This lesion was hypermetabolic on PET 08/01/2021. Scarring in the left lower lobe. No new pulmonary nodules. No pleural fluid. Airway is unremarkable.   Musculoskeletal: Degenerative changes in the spine. Old rib fractures. Flowing anterior osteophytosis in the thoracic spine.   CT ABDOMEN PELVIS FINDINGS   Hepatobiliary: Liver is decreased in attenuation diffusely. Probable small cyst in the inferior right hepatic lobe. Cholecystectomy. No unexpected biliary ductal dilatation.   Pancreas: Negative.   Spleen: Absent.   Adrenals/Urinary Tract: Adrenal glands and kidneys are unremarkable. Ureters are decompressed. Small cystocele. Bladder is otherwise grossly unremarkable.   Stomach/Bowel: Stomach and small bowel are unremarkable. Appendix is not readily visualized. Majority  of the colon is unremarkable. An anastomosis is seen in the sigmoid colon.   Vascular/Lymphatic: Atherosclerotic calcification of the aorta. No pathologically enlarged lymph nodes.   Reproductive: Hysterectomy.  No adnexal mass.   Other: No free fluid. Cystic mass in the left anatomic pelvis measures 4.2 x 5.3 cm (2/55), unchanged and likely a postoperative seroma related to sigmoid resection. Mesenteries and peritoneum are unremarkable.   Musculoskeletal: Degenerative changes in the spine. Levoconvex scoliosis.   IMPRESSION: 1. Part solid nodules bilaterally, the largest of which in the right upper lobe is grossly stable from 07/09/2022 but progressive from 03/07/2021 and hypermetabolic on 07/24/2021. Findings are indicative of primary bronchogenic carcinoma. Recommend continued attention on follow-up. 2. Hepatic steatosis. 3. Aortic atherosclerosis (ICD10-I70.0). Coronary artery calcification.     Electronically Signed   By: Leanna Battles M.D.   On: 01/23/2023 16:00     Assessment & Plan:  89 year old female with a history of lower GI bleed secondary to diverticulosis, history of GERD, IBS with chronic constipation, history of  complicated diverticular disease requiring sigmoidectomy in 2007 for stricture, prior SBO felt secondary to adhesive disease, peripheral vascular disease, history of ITP, prior cholecystectomy and splenectomy, bronchogenic carcinoma of the right lung who is seen for follow-up.   Constipation/lower abdominal discomfort/symptomatic diverticulosis --CT was reassuring.  Her "diarrhea" is consistent with overflow diarrhea.  We discussed increasing bowel regimen and avoiding Imodium is much as possible. -- Continue MiraLAX 17 g daily -- Restart Metamucil 1 working to 2 tablespoons daily -- If still not having complete and more regular bowel movements increase MiraLAX to twice daily -- Avoid Imodium when possible  2.  GERD/dyspepsia --recent exacerbation  now controlled on twice daily famotidine.  Given probable radiation upcoming for lung cancer I would recommend she continue the twice daily dosing in the event esophagitis could possibly result -- Continue famotidine 20 mg twice daily, symptoms currently well-controlled  3.  Iron deficiency after diverticular hemorrhage --blood counts have remained stable after IV iron, monitor over time  4.  Lung cancer --she is leaning towards proceeding with radiation therapy to begin next week with Dr. Mitzi Hansen.  After our discussion I think this is the best decision for her.  She will follow-up with me in February  30 minutes total spent today including patient facing time, coordination of care, reviewing medical history/procedures/pertinent radiology studies, and documentation of the encounter.

## 2023-03-26 ENCOUNTER — Other Ambulatory Visit: Payer: Self-pay | Admitting: Internal Medicine

## 2023-03-26 ENCOUNTER — Telehealth: Payer: Self-pay | Admitting: Radiation Oncology

## 2023-03-26 NOTE — Telephone Encounter (Signed)
LM for pt since she had desires to possibly delay radiation after her simulation last week.

## 2023-03-27 ENCOUNTER — Telehealth: Payer: Self-pay | Admitting: Radiation Oncology

## 2023-03-27 ENCOUNTER — Ambulatory Visit: Payer: Medicare Other | Admitting: Internal Medicine

## 2023-03-27 NOTE — Telephone Encounter (Signed)
I spoke with the patient today and she would like to move her start date of treatment to 04/10/23. Additional treatment related questions were answered at the conclusion of her call.

## 2023-04-01 ENCOUNTER — Ambulatory Visit: Payer: Medicare Other | Admitting: Radiation Oncology

## 2023-04-02 ENCOUNTER — Ambulatory Visit: Payer: Medicare Other | Admitting: Radiation Oncology

## 2023-04-02 DIAGNOSIS — C3411 Malignant neoplasm of upper lobe, right bronchus or lung: Secondary | ICD-10-CM | POA: Diagnosis not present

## 2023-04-03 ENCOUNTER — Ambulatory Visit: Payer: Medicare Other | Admitting: Obstetrics and Gynecology

## 2023-04-03 ENCOUNTER — Encounter: Payer: Medicare Other | Admitting: Obstetrics and Gynecology

## 2023-04-03 ENCOUNTER — Encounter: Payer: Self-pay | Admitting: Internal Medicine

## 2023-04-03 ENCOUNTER — Ambulatory Visit: Payer: Medicare Other | Admitting: Radiation Oncology

## 2023-04-08 ENCOUNTER — Ambulatory Visit: Payer: Medicare Other | Admitting: Radiation Oncology

## 2023-04-09 ENCOUNTER — Ambulatory Visit: Payer: Medicare Other | Admitting: Radiation Oncology

## 2023-04-09 ENCOUNTER — Ambulatory Visit: Payer: Medicare Other | Admitting: Obstetrics & Gynecology

## 2023-04-10 ENCOUNTER — Other Ambulatory Visit: Payer: Self-pay

## 2023-04-10 ENCOUNTER — Ambulatory Visit
Admission: RE | Admit: 2023-04-10 | Discharge: 2023-04-10 | Disposition: A | Discharge status: Home / Self Care | Payer: Medicare Other | Source: Ambulatory Visit | Attending: Radiation Oncology | Admitting: Radiation Oncology

## 2023-04-10 DIAGNOSIS — C3411 Malignant neoplasm of upper lobe, right bronchus or lung: Secondary | ICD-10-CM | POA: Diagnosis present

## 2023-04-10 LAB — RAD ONC ARIA SESSION SUMMARY
Course Elapsed Days: 0
Plan Fractions Treated to Date: 1
Plan Prescribed Dose Per Fraction: 12 Gy
Plan Total Fractions Prescribed: 5
Plan Total Prescribed Dose: 60 Gy
Reference Point Dosage Given to Date: 12 Gy
Reference Point Session Dosage Given: 12 Gy
Session Number: 1

## 2023-04-12 ENCOUNTER — Ambulatory Visit
Admission: RE | Admit: 2023-04-12 | Discharge: 2023-04-12 | Disposition: A | Discharge status: Home / Self Care | Payer: Medicare Other | Source: Ambulatory Visit | Attending: Radiation Oncology | Admitting: Radiation Oncology

## 2023-04-12 ENCOUNTER — Ambulatory Visit: Payer: Medicare Other

## 2023-04-12 ENCOUNTER — Other Ambulatory Visit: Payer: Self-pay

## 2023-04-12 DIAGNOSIS — C3411 Malignant neoplasm of upper lobe, right bronchus or lung: Secondary | ICD-10-CM | POA: Diagnosis not present

## 2023-04-12 LAB — RAD ONC ARIA SESSION SUMMARY
Course Elapsed Days: 2
Plan Fractions Treated to Date: 2
Plan Prescribed Dose Per Fraction: 12 Gy
Plan Total Fractions Prescribed: 5
Plan Total Prescribed Dose: 60 Gy
Reference Point Dosage Given to Date: 24 Gy
Reference Point Session Dosage Given: 12 Gy
Session Number: 2

## 2023-04-14 ENCOUNTER — Other Ambulatory Visit: Payer: Self-pay | Admitting: Internal Medicine

## 2023-04-15 ENCOUNTER — Other Ambulatory Visit: Payer: Self-pay

## 2023-04-15 ENCOUNTER — Ambulatory Visit
Admission: RE | Admit: 2023-04-15 | Discharge: 2023-04-15 | Disposition: A | Discharge status: Home / Self Care | Payer: Medicare Other | Source: Ambulatory Visit | Attending: Radiation Oncology | Admitting: Radiation Oncology

## 2023-04-15 DIAGNOSIS — C3411 Malignant neoplasm of upper lobe, right bronchus or lung: Secondary | ICD-10-CM | POA: Diagnosis not present

## 2023-04-15 LAB — RAD ONC ARIA SESSION SUMMARY
Course Elapsed Days: 5
Plan Fractions Treated to Date: 3
Plan Prescribed Dose Per Fraction: 12 Gy
Plan Total Fractions Prescribed: 5
Plan Total Prescribed Dose: 60 Gy
Reference Point Dosage Given to Date: 36 Gy
Reference Point Session Dosage Given: 12 Gy
Session Number: 3

## 2023-04-17 ENCOUNTER — Ambulatory Visit
Admission: RE | Admit: 2023-04-17 | Discharge: 2023-04-17 | Disposition: A | Discharge status: Home / Self Care | Payer: Medicare Other | Source: Ambulatory Visit | Attending: Radiation Oncology | Admitting: Radiation Oncology

## 2023-04-17 ENCOUNTER — Other Ambulatory Visit: Payer: Self-pay

## 2023-04-17 DIAGNOSIS — C3411 Malignant neoplasm of upper lobe, right bronchus or lung: Secondary | ICD-10-CM | POA: Diagnosis not present

## 2023-04-17 LAB — RAD ONC ARIA SESSION SUMMARY
Course Elapsed Days: 7
Plan Fractions Treated to Date: 4
Plan Prescribed Dose Per Fraction: 12 Gy
Plan Total Fractions Prescribed: 5
Plan Total Prescribed Dose: 60 Gy
Reference Point Dosage Given to Date: 48 Gy
Reference Point Session Dosage Given: 12 Gy
Session Number: 4

## 2023-04-19 ENCOUNTER — Ambulatory Visit
Admission: RE | Admit: 2023-04-19 | Discharge: 2023-04-19 | Disposition: A | Discharge status: Home / Self Care | Payer: Medicare Other | Source: Ambulatory Visit | Attending: Radiation Oncology

## 2023-04-19 ENCOUNTER — Ambulatory Visit: Payer: Medicare Other

## 2023-04-19 ENCOUNTER — Ambulatory Visit
Admission: RE | Admit: 2023-04-19 | Discharge: 2023-04-19 | Disposition: A | Discharge status: Home / Self Care | Payer: Medicare Other | Source: Ambulatory Visit | Attending: Radiation Oncology | Admitting: Radiation Oncology

## 2023-04-19 ENCOUNTER — Other Ambulatory Visit: Payer: Self-pay

## 2023-04-19 DIAGNOSIS — C3411 Malignant neoplasm of upper lobe, right bronchus or lung: Secondary | ICD-10-CM | POA: Diagnosis not present

## 2023-04-19 LAB — RAD ONC ARIA SESSION SUMMARY
Course Elapsed Days: 9
Plan Fractions Treated to Date: 5
Plan Prescribed Dose Per Fraction: 12 Gy
Plan Total Fractions Prescribed: 5
Plan Total Prescribed Dose: 60 Gy
Reference Point Dosage Given to Date: 60 Gy
Reference Point Session Dosage Given: 12 Gy
Session Number: 5

## 2023-04-22 NOTE — Radiation Completion Notes (Addendum)
  Radiation Oncology         [phone]) [phone] ________________________________  Name: Tamara Robertson MRN: [id number]  Date of Service: 04/19/2023  DOB: Mar 24, 1930  End of Treatment Note   Diagnosis: Putative Stage IA2, cT1bN0M0, NSCLC of the RUL.   Intent: Curative     ==========DELIVERED PLANS==========  First Treatment Date: 2023-04-10 Last Treatment Date: 2023-04-19   Plan Name: Lung_R_SBRT Site: Lung, Right Technique: SBRT/SRT-IMRT Mode: Photon Dose Per Fraction: 12 Gy Prescribed Dose (Delivered / Prescribed): 60 Gy / 60 Gy Prescribed Fxs (Delivered / Prescribed): 5 / 5     ==========ON TREATMENT VISIT DATES========== 2023-04-10, 2023-04-12, 2023-04-15, 2023-04-19, 2023-04-19, 2023-04-17   See weekly On Treatment Notes in Epic for details in the Media tab (listed as Progress notes on the On Treatment Visit Dates listed above). The patient tolerated radiation.    The patient will receive a call in about one month from the radiation oncology department. She will continue follow up with Dr. Arbutus Ped as well.      Osker Mason, PAC

## 2023-04-25 ENCOUNTER — Ambulatory Visit: Payer: Medicare Other | Admitting: Dermatology

## 2023-05-02 ENCOUNTER — Telehealth: Payer: Self-pay | Admitting: Internal Medicine

## 2023-05-02 NOTE — Telephone Encounter (Signed)
Patient called would like to know if she can be placed at a different time for the day she is scheduled for.

## 2023-05-14 ENCOUNTER — Encounter: Payer: Self-pay | Admitting: Internal Medicine

## 2023-05-14 NOTE — Progress Notes (Signed)
 Subjective:    Patient ID: Tamara Robertson, female    DOB: 01-03-1930, 89 y.o.   MRN: [id number]      HPI Tamara Robertson is here for  Chief Complaint  Patient presents with   Abdominal Pain    Abdominal pain and she hurts all over; Patient also d/c lasix  and wanted to know if that was ok    LL back pain - hurts all the time when she does something.  It starts huring when she is building up to have a BM.  Has seen ortho - did PT.  Has not improved.  Did discuss with Dr Lovenia.   Same thing happens across her upper abdomen - tenderness across upper abdomen.    She is taking senakot tea -- this morning had lumps of stool.  Still constipated.  Taking miralax  daily.      Stopped lasix  - was urinating too much.  No swelling in legs.  She does have some SOB which is chronic - ? Increased.     When she urinates she has a little pinchy pain.  It is clear but dark yellow. Does not drink much water .         Medications and allergies reviewed with patient and updated if appropriate.  Current Outpatient Medications on File Prior to Visit  Medication Sig Dispense Refill   acetaminophen  (TYLENOL ) 500 MG tablet Take 500 mg by mouth every 6 (six) hours as needed for moderate pain.     ammonium lactate (AMLACTIN) 12 % lotion 1 application Externally Twice a day     Biotin 2500 MCG CAPS Take 1 capsule by mouth daily.      calcium  carbonate (TUMS - DOSED IN MG ELEMENTAL CALCIUM ) 500 MG chewable tablet Chew 1 tablet by mouth daily as needed for indigestion or heartburn.     cholecalciferol (VITAMIN D3) 25 MCG (1000 UNIT) tablet Take 1,000 Units by mouth daily.     clobetasol  ointment (TEMOVATE ) 0.05 % Apply 1 Application topically 2 (two) times daily. Apply topically to the affected area for up to two weeks 30 g 0   escitalopram  (LEXAPRO ) 5 MG tablet TAKE ONE TABLET BY MOUTH ONE TIME DAILY 90 tablet 0   famotidine  (PEPCID ) 20 MG tablet TAKE 1 TABLET (20 MG TOTAL) BY MOUTH 2 (TWO) TIMES DAILY AS NEEDED  FOR HEARTBURN OR INDIGESTION. 180 tablet 1   Flaxseed MISC Take 5 mLs by mouth daily. Take 1 tsp daily     lidocaine  (LMX) 4 % cream Apply 1 application  topically daily as needed (inflammation).     lidocaine  4 % Place 1 patch onto the skin daily.     Magnesium  Oxide 250 MG TABS Take 1 tablet by mouth.     metoprolol  succinate (TOPROL -XL) 25 MG 24 hr tablet TAKE 1/2 (ONE-HALF) TABLET BY MOUTH ONCE DAILY WITH OR IMMEDIATELY FOLLOWING A MEAL 45 tablet 2   Multiple Vitamin (MULTIVITAMIN) tablet Take 1 tablet by mouth daily.       polyethylene glycol (MIRALAX  / GLYCOLAX ) packet Take 17 g by mouth daily as needed for moderate constipation.      predniSONE  (DELTASONE ) 2.5 MG tablet TAKE 1 TABLET BY MOUTH EVERY OTHER DAY. 45 tablet 3   Probiotic Product (PROBIOTIC DAILY PO) Take 1 tablet by mouth daily with breakfast.     PSYLLIUM PO Take 1 tablet by mouth daily as needed (constipation).     traMADol  (ULTRAM ) 50 MG tablet Take 1 tablet (50 mg total)  by mouth every 6 (six) hours as needed. 10 tablet 0   triamcinolone  cream (KENALOG ) 0.1 % APPLY 1 APPLICATION TOPICALLY 2 (TWO) TIMES DAILY AS NEEDED (RASH). 30 g 0   trolamine salicylate (ASPERCREME) 10 % cream Apply 1 application topically as needed for muscle pain.     furosemide  (LASIX ) 20 MG tablet TAKE 1.5 TABLETS BY MOUTH DAILY (Patient not taking: Reported on 05/15/2023) 135 tablet 3   No current facility-administered medications on file prior to visit.    Review of Systems  Respiratory:  Positive for shortness of breath.   Cardiovascular:  Negative for leg swelling.  Gastrointestinal:  Positive for abdominal pain and constipation.       Objective:   Vitals:   05/15/23 1500  BP: 128/72  Pulse: 72  Temp: 98.1 F (36.7 C)  SpO2: 96%   BP Readings from Last 3 Encounters:  05/15/23 128/72  03/25/23 118/66  03/05/23 (!) 163/60   Wt Readings from Last 3 Encounters:  05/15/23 148 lb (67.1 kg)  03/25/23 150 lb 3.2 oz (68.1 kg)   03/05/23 148 lb 6.4 oz (67.3 kg)   Body mass index is 27.96 kg/m.    Physical Exam Constitutional:      General: She is not in acute distress.    Appearance: Normal appearance.  HENT:     Head: Normocephalic and atraumatic.  Eyes:     Conjunctiva/sclera: Conjunctivae normal.  Cardiovascular:     Rate and Rhythm: Normal rate and regular rhythm.     Heart sounds: Normal heart sounds.  Pulmonary:     Effort: Pulmonary effort is normal. No respiratory distress.     Breath sounds: Normal breath sounds. No wheezing.  Musculoskeletal:     Cervical back: Neck supple.     Right lower leg: No edema.     Left lower leg: No edema.  Lymphadenopathy:     Cervical: No cervical adenopathy.  Skin:    General: Skin is warm and dry.     Findings: No rash.  Neurological:     Mental Status: She is alert. Mental status is at baseline.  Psychiatric:        Mood and Affect: Mood normal.        Behavior: Behavior normal.            Assessment & Plan:    See Problem List for Assessment and Plan of chronic medical problems.

## 2023-05-15 ENCOUNTER — Ambulatory Visit: Payer: Medicare Other | Admitting: Internal Medicine

## 2023-05-15 VITALS — BP 128/72 | HR 72 | Temp 98.1°F | Ht 61.0 in | Wt 148.0 lb

## 2023-05-15 DIAGNOSIS — K5909 Other constipation: Secondary | ICD-10-CM | POA: Diagnosis not present

## 2023-05-15 DIAGNOSIS — R300 Dysuria: Secondary | ICD-10-CM

## 2023-05-15 DIAGNOSIS — R600 Localized edema: Secondary | ICD-10-CM | POA: Diagnosis not present

## 2023-05-15 DIAGNOSIS — F32A Depression, unspecified: Secondary | ICD-10-CM

## 2023-05-15 DIAGNOSIS — I10 Essential (primary) hypertension: Secondary | ICD-10-CM | POA: Diagnosis not present

## 2023-05-15 DIAGNOSIS — F419 Anxiety disorder, unspecified: Secondary | ICD-10-CM

## 2023-05-15 NOTE — Patient Instructions (Addendum)
        Medications changes include :   None

## 2023-05-16 DIAGNOSIS — K5909 Other constipation: Secondary | ICD-10-CM | POA: Insufficient documentation

## 2023-05-16 NOTE — Assessment & Plan Note (Signed)
Chronic Overall well-controlled Continue Lexapro 5 mg daily

## 2023-05-16 NOTE — Assessment & Plan Note (Signed)
 Chronic Stopped lasix - caused too much urination Leg edema controlled Agree with holding lasix - can take as needed for leg edema Lasix 20 mg daily prn

## 2023-05-16 NOTE — Assessment & Plan Note (Signed)
 Acute Mild Has dark yellow urine Likely related to dehydration Deferred checking urine today - if symptoms do not improve with increased water intake will let us know so she can give a urine sample

## 2023-05-16 NOTE — Assessment & Plan Note (Signed)
 Chronic Still constipated Following with GI Taking miralax daily Drinking senokot tea a couple of times a week - advised increasing this to see if that helps her constipation

## 2023-05-16 NOTE — Assessment & Plan Note (Signed)
Chronic Blood pressure is well-controlled Continue metoprolol XL 12.5 mg daily 

## 2023-05-20 ENCOUNTER — Ambulatory Visit
Admission: RE | Admit: 2023-05-20 | Discharge: 2023-05-20 | Disposition: A | Discharge status: Home / Self Care | Payer: Medicare Other | Source: Ambulatory Visit | Attending: Radiation Oncology | Admitting: Radiation Oncology

## 2023-05-20 DIAGNOSIS — C3411 Malignant neoplasm of upper lobe, right bronchus or lung: Secondary | ICD-10-CM | POA: Insufficient documentation

## 2023-05-20 NOTE — Progress Notes (Addendum)
  Radiation Oncology         [phone]) [phone] ________________________________  Name: Tamara Robertson MRN: [id number]  Date of Service: 05/20/2023  DOB: 04-May-1930  Post Treatment Telephone Note  Diagnosis:  Putative Stage IA2, cT1bN0M0, NSCLC of the RUL. (as documented in provider EOT note)  The patient was available for call today.   Symptoms of fatigue have improved mildly since completing therapy.  Symptoms of skin changes have improved since completing therapy.  Symptoms of esophagitis have improved since completing therapy.  Patient currently reports mid to low back pain bilaterally 8/10 at it's worst. With Aleve, at it's best pain is 2/10. Pain is also radiating to the RT knee 8/10. Patient denies any ROM issues.  The patient has not scheduled a follow up with her medical oncologist Dr. Sherrod for ongoing care, and was encouraged to call if she develops concerns or questions regarding radiation.   This concludes the interaction.  Rosaline Minerva, LPN

## 2023-05-21 ENCOUNTER — Ambulatory Visit: Payer: Medicare Other | Admitting: Internal Medicine

## 2023-06-06 ENCOUNTER — Ambulatory Visit: Payer: Medicare Other | Admitting: Internal Medicine

## 2023-06-07 ENCOUNTER — Ambulatory Visit (HOSPITAL_BASED_OUTPATIENT_CLINIC_OR_DEPARTMENT_OTHER): Payer: Medicare Other

## 2023-06-10 ENCOUNTER — Ambulatory Visit: Payer: Medicare Other | Admitting: Radiation Oncology

## 2023-06-10 ENCOUNTER — Ambulatory Visit: Payer: Medicare Other | Admitting: Internal Medicine

## 2023-06-11 ENCOUNTER — Encounter: Payer: Self-pay | Admitting: Internal Medicine

## 2023-06-11 ENCOUNTER — Ambulatory Visit: Payer: Medicare Other | Admitting: Internal Medicine

## 2023-06-11 NOTE — Progress Notes (Signed)
 Virtual Visit via Video Note  I connected with MARIJOSE Robertson on 06/11/23 at 10:20 AM EST by a video enabled telemedicine application and verified that I am speaking with the correct person using two identifiers.   I discussed the limitations of evaluation and management by telemedicine and the availability of in person appointments. The patient expressed understanding and agreed to proceed.  Present for the visit:  Myself, Dr Glade Hope, Slater Lucky.  The patient is currently at home and I am in the office.    No referring provider.    History of Present Illness: This is an acute - cc:  not feeling well.  Headaches, muscle aches, hard to walk  Needs referral for back?  Having back pain.   Wonders who to see.     Body aches, cold x 4-5 days, sharp head pain, did not feel well -- she thinks it was the cephalexin  - it stopped after she stopped taking it.  Those symptoms have resolved.     Nail get flared up - recurrent paronychia - that is what she was taking the cephalexin  for.  ? Completely gone.  Her nail keeps falling off.    BP yesterday - was SOB -- took BP -- 150/68,  O2 96 HR 62.  BP has otherwise been controlled  Exposed to flu last Friday.  No symptoms yet.    Ct Friday - recheck lung ca    ROS    Social History   Socioeconomic History   Marital status: Widowed    Spouse name: Not on file   Number of children: 2   Years of education: Not on file   Highest education level: 12th grade  Occupational History   Occupation: Retired    Comment: nutritional therapist office  Tobacco Use   Smoking status: Never   Smokeless tobacco: Never  Vaping Use   Vaping status: Never Used  Substance and Sexual Activity   Alcohol use: No   Drug use: No   Sexual activity: Not Currently    Birth control/protection: Surgical    Comment: 1st intercourse- 19, partners- 2, hysterectomy  Other Topics Concern   Not on file  Social History Narrative   Retired futures trader.     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 to be close to kids   Social Drivers of Health   Financial Resource Strain: Low Risk  (03/25/2023)   Overall Financial Resource Strain (CARDIA)    Difficulty of Paying Living Expenses: Not hard at all  Food Insecurity: No Food Insecurity (03/25/2023)   Hunger Vital Sign    Worried About Running Out of Food in the Last Year: Never true    Ran Out of Food in the Last Year: Never true  Transportation Needs: No Transportation Needs (03/25/2023)   PRAPARE - Administrator, Civil Service (Medical): No    Lack of Transportation (Non-Medical): No  Physical Activity: Insufficiently Active (03/25/2023)   Exercise Vital Sign    Days of Exercise per Week: 3 days    Minutes of Exercise per Session: 20 min  Stress: No Stress Concern Present (03/25/2023)   [employer] of Occupational Health - Occupational Stress Questionnaire    Feeling of Stress : Only a little  Social Connections: Moderately Integrated (03/25/2023)   Social Connection and Isolation Panel [NHANES]    Frequency of Communication with Friends and Family: More than three times a week    Frequency of Social Gatherings with  Friends and Family: Once a week    Attends Religious Services: More than 4 times per year    Active Member of Clubs or Organizations: Yes    Attends Banker Meetings: More than 4 times per year    Marital Status: Widowed     Observations/Objective: Appears well in NAD   Assessment and Plan:  See Problem List for Assessment and Plan of chronic medical problems.   Has an appt to see Dr Melodi soon - will see who he recommends regarding her back.   Follow Up Instructions:    I discussed the assessment and treatment plan with the patient. The patient was provided an opportunity to ask questions and all were answered. The patient agreed with the plan and demonstrated an understanding of the instructions.   The patient was advised to call  back or seek an in-person evaluation if the symptoms worsen or if the condition fails to improve as anticipated.    Glade JINNY Hope, MD

## 2023-06-12 ENCOUNTER — Telehealth (INDEPENDENT_AMBULATORY_CARE_PROVIDER_SITE_OTHER): Payer: Medicare Other | Admitting: Internal Medicine

## 2023-06-12 DIAGNOSIS — I10 Essential (primary) hypertension: Secondary | ICD-10-CM

## 2023-06-12 DIAGNOSIS — L03019 Cellulitis of unspecified finger: Secondary | ICD-10-CM | POA: Diagnosis not present

## 2023-06-12 MED ORDER — AMOXICILLIN-POT CLAVULANATE 875-125 MG PO TABS: 1.0000 | ORAL_TABLET | Freq: Two times a day (BID) | ORAL | 0 refills | Status: DC

## 2023-06-12 NOTE — Assessment & Plan Note (Signed)
 Recurrent Took keflex  for it but had side effects from the medication - stopped it early Better - ? Completely gone Will prescribe Augmentin  to use if symptoms recur - she will try conservative measures first

## 2023-06-12 NOTE — Assessment & Plan Note (Signed)
 Chronic Blood pressure is well-controlled - had a recent elevated BP at home but typically well controlled Continue metoprolol  XL 12.5 mg daily

## 2023-06-13 ENCOUNTER — Encounter: Payer: Self-pay | Admitting: Internal Medicine

## 2023-06-13 ENCOUNTER — Ambulatory Visit: Payer: Medicare Other | Admitting: Internal Medicine

## 2023-06-13 VITALS — BP 126/68 | HR 71 | Ht 61.0 in | Wt 149.5 lb

## 2023-06-13 DIAGNOSIS — K5909 Other constipation: Secondary | ICD-10-CM

## 2023-06-13 DIAGNOSIS — R1013 Epigastric pain: Secondary | ICD-10-CM

## 2023-06-13 DIAGNOSIS — C3491 Malignant neoplasm of unspecified part of right bronchus or lung: Secondary | ICD-10-CM

## 2023-06-13 DIAGNOSIS — K5731 Diverticulosis of large intestine without perforation or abscess with bleeding: Secondary | ICD-10-CM | POA: Diagnosis not present

## 2023-06-13 DIAGNOSIS — K219 Gastro-esophageal reflux disease without esophagitis: Secondary | ICD-10-CM | POA: Diagnosis not present

## 2023-06-13 NOTE — Patient Instructions (Signed)
 Continue Pepcid  twice a week.  Use Miralax  and Smooth Move tea as needed.  We will schedule you for an appointment the week of May 12 and let you know the date when the schedule is available.  Thank you for entrusting me with your care and for choosing [facility], Dr. Gordy Starch  If your blood pressure at your visit was 140/90 or greater, please contact your primary care physician to follow up on this. ______________________________________________________  If you are age 7 or older, your body mass index should be between 23-30. Your Body mass index is 28.25 kg/m. If this is out of the aforementioned range listed, please consider follow up with your Primary Care Provider.  If you are age 87 or younger, your body mass index should be between 19-25. Your Body mass index is 28.25 kg/m. If this is out of the aformentioned range listed, please consider follow up with your Primary Care Provider.  ________________________________________________________  The [facility] providers would like to encourage you to use MYCHART to communicate with providers for non-urgent requests or questions.  Due to long hold times on the telephone, sending your provider a message by [facility] may be a faster and more efficient way to get a response.  Please allow 48 business hours for a response.  Please remember that this is for non-urgent requests.  _______________________________________________________  Due to recent changes in healthcare laws, you may see the results of your imaging and laboratory studies on MyChart before your provider has had a chance to review them.  We understand that in some cases there may be results that are confusing or concerning to you. Not all laboratory results come back in the same time frame and the provider may be waiting for multiple results in order to interpret others.  Please give us  48 hours in order for your provider to thoroughly review all the results before contacting  the office for clarification of your results.

## 2023-06-13 NOTE — Progress Notes (Signed)
 Subjective:    Patient ID: Tamara Robertson, female    DOB: 08-23-1929, 89 y.o.   MRN: [id number]  HPI Tamara Robertson is a 89 year old female with a history of GERD, IBS with chronic constipation, prior GI bleed secondary to diverticulosis, complicated diverticular disease requiring sigmoid resection in 2007, history of small bowel obstruction secondary to adhesive disease, peripheral vascular disease, history of ITP, bronchogenic carcinoma of the right lung with recent radiation therapy who is here for follow-up.  She is here alone today and I last saw her on 03/25/2023.  She has been experiencing symptoms of GERD and is currently taking famotidine  twice daily, one dose after breakfast and another before bed. This regimen is effective in managing her symptoms. She was previously on pantoprazole  but is now using famotidine .  This was increased to twice daily when she saw Dr. Geofm earlier this month.  She is very happy with the response.  She experiences abdominal pain and incomplete bowel movements. She started using Senna tea, which she finds effective, and has not experienced any abdominal pain recently. She has not used Senna tea for three to four weeks and only uses it when she feels constipated. She also uses Miralax  daily.  She recently completed five sessions of radiation therapy every other day. She reports feeling tired as a side effect but did not experience any other significant side effects.   Review of Systems As per HPI, otherwise negative  Current Medications, Allergies, Past Medical History, Past Surgical History, Family History and Social History were reviewed in [employer] record.    Objective:   Physical Exam BP 126/68   Pulse 71   Ht 5' 1 (1.549 m)   Wt 149 lb 8 oz (67.8 kg)   SpO2 98%   BMI 28.25 kg/m  Gen: awake, alert, NAD HEENT: anicteric  Abd: soft, NT/ND, +BS throughout Ext: no c/c/e Neuro: nonfocal     Latest Ref Rng & Units 02/26/2023     2:30 PM 01/23/2023    3:48 PM 10/12/2022    4:15 PM  CBC  WBC 4.0 - 10.5 K/uL 9.5  11.4  11.3   Hemoglobin 12.0 - 15.0 g/dL 86.7  86.4  87.1   Hematocrit 36.0 - 46.0 % 39.6  41.4  39.8   Platelets 150 - 400 K/uL 168  175.0  178.0         Assessment & Plan:  89 year old female with a history of GERD, IBS with chronic constipation, prior GI bleed secondary to diverticulosis, complicated diverticular disease requiring sigmoid resection in 2007, history of small bowel obstruction secondary to adhesive disease, peripheral vascular disease, history of ITP, bronchogenic carcinoma of the right lung with recent radiation therapy who is here for follow-up.    Constipation/lower abdominal discomfort --MiraLAX  has been working well though she has had some breakthrough constipation for which senna in the form of smooth move tea seems to be working well.  We discussed this and she can use either or both safely. --MiraLAX  daily with or without senna daily Or --Senna daily with or without MiraLAX  daily  2.  GERD/dyspepsia --exacerbation having calm down completely without alarm symptoms now that she is on twice daily famotidine  --Continue famotidine  20 mg twice daily  3.  Lung cancer status post recent radiation --she tolerated this well. --Surveillance CT scan scheduled for tomorrow.  She will see me in May per her request  30 minutes total spent today including patient facing time, coordination of  care, reviewing medical history/procedures/pertinent radiology studies, and documentation of the encounter.

## 2023-06-13 NOTE — Progress Notes (Deleted)
  Subjective:     Patient ID: Tamara Robertson, female   DOB: [birth year], 89 y.o.   MRN: [id number]  HPI   Review of Systems     Objective:   Physical Exam     Assessment:     ***    Plan:     ***

## 2023-06-14 ENCOUNTER — Telehealth: Payer: Self-pay | Admitting: Internal Medicine

## 2023-06-14 ENCOUNTER — Ambulatory Visit (HOSPITAL_BASED_OUTPATIENT_CLINIC_OR_DEPARTMENT_OTHER)
Admission: RE | Admit: 2023-06-14 | Discharge: 2023-06-14 | Disposition: A | Discharge status: Home / Self Care | Payer: Medicare Other | Source: Ambulatory Visit | Attending: Radiation Oncology | Admitting: Radiation Oncology

## 2023-06-14 DIAGNOSIS — C3411 Malignant neoplasm of upper lobe, right bronchus or lung: Secondary | ICD-10-CM | POA: Insufficient documentation

## 2023-06-14 MED ORDER — FAMOTIDINE 20 MG PO TABS: ORAL_TABLET | ORAL | 3 refills | Status: DC

## 2023-06-14 NOTE — Telephone Encounter (Signed)
 PT dosage of famotidine  was recently changed and now she is running out of the medication. The pharmacy will not fill it. She uses [facility] Please advise.

## 2023-06-14 NOTE — Telephone Encounter (Signed)
 Patient states she has been taking famotidine  40 mg twice daily and the prescription is for 20 mg twice daily so she did run out early. Patient states she does want to cut back on how much she is taking so she wants us  to send a refill stating famotidine  20 mg twice daily. Prescription sent to patient's pharmacy.

## 2023-06-19 ENCOUNTER — Encounter: Payer: Self-pay | Admitting: Internal Medicine

## 2023-06-19 ENCOUNTER — Telehealth: Payer: Self-pay | Admitting: Radiation Oncology

## 2023-06-19 ENCOUNTER — Other Ambulatory Visit: Payer: Self-pay

## 2023-06-19 MED ORDER — FAMOTIDINE 40 MG PO TABS: 40.0000 mg | ORAL_TABLET | Freq: Every day | ORAL | 11 refills | Status: DC

## 2023-06-19 NOTE — Telephone Encounter (Signed)
I called the patient after she left a voicemail about her CT scan results. She had a CT for post treatment evaluation on 06/14/23. I assured her she hadn't "fallen through the cracks," but that her scan had not been read by the radiologists, but that I would reach out to her once a final report was available. She is in agreement and requests copies of her notes for an [employer] policy which I've asked our staff to mail her. She is also in need of follow up with Dr. Arbutus Ped for her ITP and to transition to surveillance for her putative lung cancer with him in 6 months provided her CT from last week is stable.

## 2023-06-24 ENCOUNTER — Telehealth: Payer: Self-pay | Admitting: Obstetrics and Gynecology

## 2023-06-24 ENCOUNTER — Ambulatory Visit: Payer: Medicare Other | Admitting: Obstetrics and Gynecology

## 2023-06-24 ENCOUNTER — Encounter: Payer: Self-pay | Admitting: Obstetrics and Gynecology

## 2023-06-24 ENCOUNTER — Telehealth: Payer: Self-pay

## 2023-06-24 VITALS — BP 126/76 | Ht 61.0 in | Wt 151.0 lb

## 2023-06-24 DIAGNOSIS — N644 Mastodynia: Secondary | ICD-10-CM

## 2023-06-24 NOTE — Telephone Encounter (Signed)
Called and left message for patient that Dr. Lauro Franklin May schedule is open and I would like to get her scheduled asap for a F/U appointment (constipation). Asked that she call back right away

## 2023-06-24 NOTE — Progress Notes (Signed)
GYNECOLOGY  VISIT   HPI: 89 y.o.   Widowed  Caucasian female   G3P3 with No LMP recorded. Patient has had a hysterectomy.   here for: pain in left lateral breast for 6 months. Mammo normal.   The breast discomfort is not constant.   When she takes Aleve, the pain resolved.   Nothing makes it better or worse.   No recent injury or change in exercise routine.   Not using any hormone treatment.   Used HRT for a time after her hysterectomy for endometriosis.   Hx radiation to her right lung.  Just had a CT of her chest today and is awaiting results from her provider.  She states no prior biopsy.   GYNECOLOGIC HISTORY: No LMP recorded. Patient has had a hysterectomy. Contraception:  hyst Menopausal hormone therapy:  n/a Last 2 paps:  2000 WNL History of abnormal Pap or positive HPV:  no Mammogram:  03/13/23 Breast Density Cat B, BI-RADS CAT 1 neg        OB History     Gravida  3   Para  3   Term      Preterm      AB      Living  2      SAB      IAB      Ectopic      Multiple      Live Births           Obstetric Comments  1 child passed away at 85            Patient Active Problem List   Diagnosis Date Noted   Paronychia of finger 06/12/2023   Chronic constipation 05/16/2023   Malignant neoplasm of upper lobe of right lung (HCC) 03/04/2023   History of ITP 02/26/2023   Nonrheumatic aortic valve insufficiency 12/15/2022   Mitral valve insufficiency 12/15/2022   Dysuria 08/28/2022   Popliteal pain 05/14/2022   IDA (iron deficiency anemia) 03/14/2022   Lumbar pain 02/28/2022   Diverticulosis of colon with hemorrhage    Acute blood loss anemia    GI bleeding 02/18/2022   Acute lower GI bleeding 02/17/2022   Pain due to onychomycosis of toenails of both feet 12/11/2021   Shortness of breath 12/01/2021   Abnormal CT of the chest 07/14/2021   Hematochezia 05/17/2021   COVID 05/17/2021   Pain in joint of right shoulder 02/14/2021    Insertional Achilles tendinopathy 02/14/2021   Posterior calcaneal exostosis 02/14/2021   Right Achilles bursitis 01/20/2021   Paroxysmal A-fib (HCC) 01/05/2021   Aortic atherosclerosis (HCC) 12/14/2020   Spinal stenosis of lumbar region with neurogenic claudication 12/14/2020   Melanocytic nevi of trunk 10/05/2020   Peripheral venous insufficiency 10/05/2020   Rosacea 10/05/2020   Temporal arteritis (HCC) 10/05/2020   Viral warts 10/05/2020   Posterior neck pain 08/03/2019   Dizziness 08/03/2019   Left-sided ischial pain 07/24/2019   Nonrheumatic aortic valve stenosis 02/26/2019   Sensorineural hearing loss (SNHL), bilateral 01/30/2019   Chronic ITP (idiopathic thrombocytopenia) (HCC) 12/01/2018   Lightheadedness 11/19/2018   Epistaxis 11/19/2018   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   Rash 03/25/2018   Chronic left-sided headaches 01/28/2018   SBO (small bowel obstruction) (HCC) 09/20/2017   Aortic stenosis, moderate 08/22/2017   CAD (coronary artery disease) 08/21/2017   Symptomatic anemia 07/23/2017   Cough 06/25/2017   Current chronic use of systemic steroids 06/25/2017   Varicose veins of left lower extremity with complications 03/05/2017  Bilateral impacted cerumen 02/01/2017   Hypertension 07/31/2016   Leg edema 07/31/2016   Prediabetes 06/05/2016   Osteoporosis 12/01/2015   Anxiety and depression 07/19/2015   Degenerative cervical disc 12/28/2014   Left ovarian cyst    Peripheral neuropathy 06/03/2012   PVD (peripheral vascular disease) (HCC) 11/23/2010   Bursitis of hip 10/26/2010   Hyperlipidemia 06/27/2010   Unspecified glaucoma 06/27/2010   GERD 06/27/2010   Irritable bowel syndrome 06/27/2010   OVERACTIVE BLADDER 06/27/2010   OSTEOARTHRITIS, KNEE, RIGHT 06/27/2010   URINARY INCONTINENCE 06/27/2010    Past Medical History:  Diagnosis Date   Achilles tendonitis    Acute lower GI bleeding 2023   Anxiety    Aortic atherosclerosis (HCC)    CAD (coronary  artery disease)    RCA 40% stenosis cath 01/2011   Diverticular stricture [facility]) 2006   [facility]   DIVERTICULITIS, HX OF    Diverticulosis    DYSLIPIDEMIA    Elevated LFTs    GERD    Glaucoma    Hepatic steatosis    HOH (hard of hearing)    Immune thrombocytopenic purpura (HCC)    chronic - baseline 80-100K, on pred   Irritable bowel syndrome    Left ovarian cyst dx 01/2013 CT   working with gyn, ?malignant - elevated tumor marker OVA1   Lung cancer (HCC) 2023   OSTEOARTHRITIS, KNEE, RIGHT    OSTEOPENIA    OVERACTIVE BLADDER    Plantar fasciitis    Prolapse of female bladder, acquired 11/2017   Torn rotator cuff    right shoulder   URINARY INCONTINENCE     Past Surgical History:  Procedure Laterality Date   ABDOMINAL HYSTERECTOMY  1963   APPENDECTOMY  1956   BIOPSY  02/18/2022   Procedure: BIOPSY;  Surgeon: Lemar Lofty., MD;  Location: [facility];  Service: Gastroenterology;;   CARDIAC CATHETERIZATION     CATARACT EXTRACTION, BILATERAL  10/2010   CHOLECYSTECTOMY N/A 09/16/2014   Procedure: LAPAROSCOPIC CHOLECYSTECTOMY ;  Surgeon: Emelia Loron, MD;  Location: [facility] OR;  Service: General;  Laterality: N/A;   COLONOSCOPY N/A 02/19/2022   Procedure: COLONOSCOPY;  Surgeon: Sherrilyn Rist, MD;  Location: [facility];  Service: Gastroenterology;  Laterality: N/A;   ESOPHAGOGASTRODUODENOSCOPY N/A 02/18/2022   Procedure: ESOPHAGOGASTRODUODENOSCOPY (EGD);  Surgeon: Lemar Lofty., MD;  Location: [facility];  Service: Gastroenterology;  Laterality: N/A;   FLEXIBLE SIGMOIDOSCOPY N/A 02/18/2022   Procedure: FLEXIBLE SIGMOIDOSCOPY;  Surgeon: Meridee Score Netty Starring., MD;  Location: [facility];  Service: Gastroenterology;  Laterality: N/A;   HEMOSTASIS CLIP PLACEMENT  02/19/2022   Procedure: HEMOSTASIS CLIP PLACEMENT;  Surgeon: Sherrilyn Rist, MD;  Location: [facility];  Service: Gastroenterology;;   KNEE ARTHROSCOPY Right    L  pop PTA  10/2009   stent   LAPAROSCOPIC SIGMOID COLECTOMY  10/2005   SPLENECTOMY  1954   VARICOSE VEIN SURGERY Right 1962    Current Outpatient Medications  Medication Sig Dispense Refill   acetaminophen (TYLENOL) 500 MG tablet Take 500 mg by mouth every 6 (six) hours as needed for moderate pain.     ammonium lactate (AMLACTIN) 12 % lotion 1 application Externally Twice a day     Biotin 2500 MCG CAPS Take 1 capsule by mouth daily.      calcium carbonate (TUMS - DOSED IN MG ELEMENTAL CALCIUM) 500 MG chewable tablet Chew 1 tablet by mouth daily as needed for indigestion or heartburn.     cholecalciferol (  VITAMIN D3) 25 MCG (1000 UNIT) tablet Take 1,000 Units by mouth daily.     clobetasol ointment (TEMOVATE) 0.05 % Apply 1 Application topically 2 (two) times daily. Apply topically to the affected area for up to two weeks 30 g 0   escitalopram (LEXAPRO) 5 MG tablet TAKE ONE TABLET BY MOUTH ONE TIME DAILY 90 tablet 0   famotidine (PEPCID) 20 MG tablet TAKE 1 TABLET (20 MG TOTAL) BY MOUTH 2 (TWO) TIMES DAILY AS NEEDED FOR HEARTBURN OR INDIGESTION. 180 tablet 3   famotidine (PEPCID) 40 MG tablet Take 1 tablet (40 mg total) by mouth daily. 30 tablet 11   Flaxseed MISC Take 5 mLs by mouth daily. Take 1 tsp daily     furosemide (LASIX) 20 MG tablet TAKE 1.5 TABLETS BY MOUTH DAILY 135 tablet 3   lidocaine (LMX) 4 % cream Apply 1 application  topically daily as needed (inflammation).     lidocaine 4 % Place 1 patch onto the skin daily.     Magnesium Oxide 250 MG TABS Take 1 tablet by mouth.     metoprolol succinate (TOPROL-XL) 25 MG 24 hr tablet TAKE 1/2 (ONE-HALF) TABLET BY MOUTH ONCE DAILY WITH OR IMMEDIATELY FOLLOWING A MEAL 45 tablet 2   Multiple Vitamin (MULTIVITAMIN) tablet Take 1 tablet by mouth daily.       polyethylene glycol (MIRALAX / GLYCOLAX) packet Take 17 g by mouth daily as needed for moderate constipation.      predniSONE (DELTASONE) 2.5 MG tablet TAKE 1 TABLET BY MOUTH EVERY OTHER  DAY. 45 tablet 3   Probiotic Product (PROBIOTIC DAILY PO) Take 1 tablet by mouth daily with breakfast.     PSYLLIUM PO Take 1 tablet by mouth daily as needed (constipation).     triamcinolone cream (KENALOG) 0.1 % APPLY 1 APPLICATION TOPICALLY 2 (TWO) TIMES DAILY AS NEEDED (RASH). 30 g 0   trolamine salicylate (ASPERCREME) 10 % cream Apply 1 application topically as needed for muscle pain.     No current facility-administered medications for this visit.     ALLERGIES: Aspirin, Sulfa antibiotics, Contrast media [iodinated contrast media], Shellfish-derived products, Apixaban, Cephalexin, Fluoxetine, Sulfamethoxazole-trimethoprim, Celebrex [celecoxib], Celexa [citalopram], and Statins  Family History  Problem Relation Age of Onset   Coronary artery disease Mother    Heart attack Mother 59   Hyperlipidemia Mother    Hypertension Mother    Stomach cancer Father    Hypertension Daughter    Hyperlipidemia Daughter    Arthritis Other        parent   Transient ischemic attack Other        parent   BRCA 1/2 Neg Hx    Breast cancer Neg Hx     Social History   Socioeconomic History   Marital status: Widowed    Spouse name: Not on file   Number of children: 2   Years of education: Not on file   Highest education level: 12th grade  Occupational History   Occupation: Retired    Comment: Nutritional therapist office  Tobacco Use   Smoking status: Never   Smokeless tobacco: Never  Vaping Use   Vaping status: Never Used  Substance and Sexual Activity   Alcohol use: No   Drug use: No   Sexual activity: Not Currently    Birth control/protection: Surgical    Comment: 1st intercourse- 19, partners- 2, hysterectomy  Other Topics Concern   Not on file  Social History Narrative   Retired Futures trader.  Moved to [place] from [place] 05/2010 to be close to kids   Social Drivers of Health   Financial Resource Strain: Low Risk  (03/25/2023)   Overall Financial Resource Strain  (CARDIA)    Difficulty of Paying Living Expenses: Not hard at all  Food Insecurity: No Food Insecurity (03/25/2023)   Hunger Vital Sign    Worried About Running Out of Food in the Last Year: Never true    Ran Out of Food in the Last Year: Never true  Transportation Needs: No Transportation Needs (03/25/2023)   PRAPARE - Administrator, Civil Service (Medical): No    Lack of Transportation (Non-Medical): No  Physical Activity: Insufficiently Active (03/25/2023)   Exercise Vital Sign    Days of Exercise per Week: 3 days    Minutes of Exercise per Session: 20 min  Stress: No Stress Concern Present (03/25/2023)   [employer] of Occupational Health - Occupational Stress Questionnaire    Feeling of Stress : Only a little  Social Connections: Moderately Integrated (03/25/2023)   Social Connection and Isolation Panel [NHANES]    Frequency of Communication with Friends and Family: More than three times a week    Frequency of Social Gatherings with Friends and Family: Once a week    Attends Religious Services: More than 4 times per year    Active Member of [employer] or Organizations: Yes    Attends Banker Meetings: More than 4 times per year    Marital Status: Widowed  Intimate Partner Violence: Not At Risk (03/05/2023)   Humiliation, Afraid, Rape, and Kick questionnaire    Fear of Current or Ex-Partner: No    Emotionally Abused: No    Physically Abused: No    Sexually Abused: No    Review of Systems  All other systems reviewed and are negative.   PHYSICAL EXAMINATION:   BP 126/76 (BP Location: Right Arm, Patient Position: Sitting, Cuff Size: Small)   Ht 5\' 1"  (1.549 m)   Wt 151 lb (68.5 kg)   BMI 28.53 kg/m     General appearance: alert, cooperative and appears stated age Head: Normocephalic, without obvious abnormality, atraumatic Neck: no adenopathy,  Breasts: r Right - normal appearance, no masses or tenderness, No nipple retraction or dimpling, No  nipple discharge or bleeding, No axillary or supraclavicular adenopathy Left - normal appearance, left lateral breast tenderness, no mass, no nipple retraction or dimpling, No nipple discharge or bleeding, No axillary or supraclavicular adenopathy  Chaperone was present for exam:  Warren Lacy, CMA  ASSESSMENT:  Left lateral breast pain.  No mass on exam.  Hx suspected adenocarcinoma of right lung.  Status post radiation therapy.   PLAN:  [facility] left [facility].  We discussed comfort measures for breast pain:  Tylenol, Ibuprofen, heat, ice packs, change in bra.  FU prn.   30 min  total time was spent for this patient encounter, including preparation, face-to-face counseling with the patient, coordination of care, and documentation of the encounter.

## 2023-06-24 NOTE — Patient Instructions (Signed)
 Breast Tenderness Breast tenderness is a common problem for women of all ages, but may also occur in men. Breast tenderness has many possible causes, including hormone changes, infections, taking certain medicines, and caffeine intake. In women, the pain usually comes and goes with the menstrual cycle, but it can also be constant. Breast tenderness may range from mild discomfort to severe pain. You may have tests, such as a mammogram or an ultrasound, to check for any unusual findings. Having breast tenderness usually does not mean that you have breast cancer. Follow these instructions at home: Managing pain and discomfort  If directed, put ice on the painful area. To do this: Put ice in a plastic bag. Place a towel between your skin and the bag. Leave the ice on for 20 minutes, 2-3 times a day. If your skin turns bright red, remove the ice right away to prevent skin damage. The risk of skin damage is higher if you cannot feel pain, heat, or cold. Wear a supportive bra or chest support: During exercise. While sleeping, if your breasts are very tender. Medicines Take over-the-counter and prescription medicines only as told by your health care provider. If the cause of your pain is an infection, you may be prescribed an antibiotic medicine. If you were prescribed antibiotics, take them as told by your health care provider. Do not stop using the antibiotic even if you start to feel better. Eating and drinking Decrease the amount of caffeine in your diet. Instead, drink more water and choose caffeine-free drinks. Your health care provider may recommend that you lessen the amount of fat in your diet. You can do this by: Limiting fried foods. Cooking foods using methods such as baking, boiling, grilling, and broiling. General instructions  Keep a log of the days and times when your breasts are most tender. Ask your health care provider how to do breast exams at home. This will help you notice if  you have an unusual growth or lump. Keep all follow-up visits. Contact a health care provider if: Any part of your breast is hard, red, and hot to the touch. This may be a sign of infection. You are a woman and have a new or painful lump in your breast that remains after your menstrual period ends. You are not breastfeeding and you have fluid, especially blood or pus, coming out of your nipples. You have a fever. Your pain does not improve or it gets worse. Your pain is interfering with your daily activities. Summary Breast tenderness may range from mild discomfort to severe pain. Breast tenderness has many possible causes, including hormone changes, infections, taking certain medicines, and caffeine intake. It can be treated with ice, wearing a supportive bra or chest support, and medicines. Make changes to your diet as told by your health care provider. This information is not intended to replace advice given to you by your health care provider. Make sure you discuss any questions you have with your health care provider. Document Revised: 07/05/2021 Document Reviewed: 07/05/2021 Elsevier Patient Education  2024 [employer].

## 2023-06-24 NOTE — Telephone Encounter (Signed)
-----   Message from [facility] sent at 06/13/2023 12:36 PM EST ----- Regarding: OV with Dr. Rhea Belton Scheduled patient for OV with Pyrtle the week of May 12 (15th is best) in the pm.  If not available schedule her for the week of May 7th or 20th (f/u constipation)

## 2023-06-24 NOTE — Telephone Encounter (Signed)
Please schedule dx left mammogram and left breast/axillary [place] for [facility].   She has left lateral breast pain.   No mass.   Hx adenocarcinoma of the right lung.   She prefers an afternoon appointment.

## 2023-06-25 ENCOUNTER — Telehealth: Payer: Self-pay | Admitting: Radiation Oncology

## 2023-06-25 NOTE — Telephone Encounter (Signed)
Patient called and stated that she would like to speak to Jan or Marchelle Folks. I spoke to Remy and informed patient of her appointment date and time. Patient stated that she would like an appointment for June. Patient is requesting a call back. Please advise.

## 2023-06-25 NOTE — Telephone Encounter (Signed)
Dx MMG orders placed.   Spoke with Scarlette Calico at [facility].  Patient scheduled for left breast Dx MMG and [place] on 07/22/23 at 1440.   Spoke with patient, advised of appt as seen above. Patient verbalizes understanding and is agreeable.   Encounter closed.

## 2023-06-25 NOTE — Telephone Encounter (Signed)
Called and spoke to patient.  She confirmed her appointment with Dr. Rhea Belton on Wed, 5-21 at 1:50 pm.

## 2023-06-25 NOTE — Telephone Encounter (Signed)
I called the patient to review her CT scan and that the area in question was fully treated in the right lung and consistent with post radiation changes. Dr. Mitzi Hansen recommends a repeat CT in 6 months time. She has one scheduled in March, but it appears this was ordered prior to her radiation being given. I'll message Dr. Delton Coombes to see if he thinks it's reasonable to cancel this, and also message Cassie Heilingoetter, PAC since she will also be following the patient. The patient is comfortable in surveillance with Cassie and Dr. Arbutus Ped since she's already established with them as well for ITP. We will be happy to see her as needed.

## 2023-06-26 NOTE — Telephone Encounter (Signed)
Okay to cancel my scheduled CT chest and follow at the appropriate interval.  Thanks for the update

## 2023-06-27 ENCOUNTER — Other Ambulatory Visit: Payer: Self-pay | Admitting: Physician Assistant

## 2023-06-27 DIAGNOSIS — C3490 Malignant neoplasm of unspecified part of unspecified bronchus or lung: Secondary | ICD-10-CM

## 2023-07-01 ENCOUNTER — Other Ambulatory Visit: Payer: Self-pay | Admitting: Internal Medicine

## 2023-07-04 ENCOUNTER — Other Ambulatory Visit: Payer: Self-pay | Admitting: Medical Oncology

## 2023-07-04 DIAGNOSIS — C3490 Malignant neoplasm of unspecified part of unspecified bronchus or lung: Secondary | ICD-10-CM

## 2023-07-04 NOTE — Progress Notes (Signed)
 Pt confirmed august appts. She will call me when she wants me to send in prednisone rx pre CT.

## 2023-07-21 NOTE — Progress Notes (Unsigned)
 Cardiology Office Note:   Date:  07/23/2023  ID:  Tamara Robertson, DOB 31-Mar-1930, MRN [id number] PCP: Pincus Sanes, MD  [facility] Providers Cardiologist:  Rollene Rotunda, MD {  History of Present Illness:   Tamara Robertson is a 89 y.o. female who presents for evaluation of PT.  She has had non obstructive CAD.    She had remote PTA in [place], and chronic LE edema.   She was in the hospital with new onset atrial fibrillation with rapid ventricular response.  He had demand ischemia with elevated troponins.  She was treated with IV diltiazem and also antibiotics for possible pneumonia.  She converted to spontaneous sinus rhythm.  He had some sinus bradycardia with first-degree AV block and some 2.5-second pauses.  She was sent home on a low-dose beta-blocker and Eliquis.  Her echo demonstrated EF of 60 to 65% with normal wall motion.  She did have some dizziness.  She was to receive a 30-day event monitor.  Because of dizziness we did move her beta-blocker to the evening.    She wore a monitor and there was no evidence of atrial fib.   She came off of DOAC because of GI bleeding and rash on Eliquis.     Since I last saw her she has had no new cardiovascular complaints.  She is about to get some knee injections because she has bone-on-bone joint pain.  She occasionally has some sporadic short-lived shortness of breath and sharp mid chest pain.  However, she can walk up and down the [place] at the grocery store without any complaints.  She does not have any presyncope or syncope.  She has had no weight gain or edema.  She has not felt any palpitations.    ROS: As stated in the HPI and negative for all other systems.\  Studies Reviewed:    EKG:   EKG Interpretation Date/Time:  Tuesday July 23 2023 14:04:37 EDT Ventricular Rate:  72 PR Interval:  172 QRS Duration:  88 QT Interval:  394 QTC Calculation: 431 R Axis:   -13  Text Interpretation: Normal sinus rhythm Septal infarct , age  undetermined When compared with ECG of 18-Dec-2022 14:58, No significant change since last tracing Confirmed by Rollene Rotunda ([id number]) on 07/23/2023 2:06:28 PM     Risk Assessment/Calculations:    CHA2DS2-VASc Score = 4   This indicates a 4.8% annual risk of stroke. The patient's score is based upon:       Physical Exam:   VS:  BP 138/62 (BP Location: Left Arm, Patient Position: Sitting, Cuff Size: Normal)   Pulse 76   Wt 152 lb 3.2 oz (69 kg)   SpO2 94%   BMI 28.76 kg/m    Wt Readings from Last 3 Encounters:  07/23/23 152 lb 3.2 oz (69 kg)  06/24/23 151 lb (68.5 kg)  06/13/23 149 lb 8 oz (67.8 kg)     GEN: Well nourished, well developed in no acute distress NECK: No JVD; transmitted systolic murmur versus bilateral soft carotid bruits CARDIAC: RRR, 2 out of 6 apical systolic murmur radiating slightly at the aortic outflow tract, no diastolic murmurs, rubs, gallops RESPIRATORY:  Clear to auscultation without rales, wheezing or rhonchi  ABDOMEN: Soft, non-tender, non-distended EXTREMITIES:  No edema; No deformity   ASSESSMENT AND PLAN:   Paroxysmal Atrial Fibrillation: She has not felt any palpitations.  She has not tolerated anticoagulation.  No change in therapy.   Mild to Moderate Aortic  Regurgitation/Mild to Moderate Mitral Regurgitation: I will follow this clinically.  No echo at this time.   Hypertension:  Her blood pressure is at target.  No change in therapy.  Bruit: I suspect this is probably more systolic transmitted murmur but I will check carotid Dopplers when she comes to get ABIs for VVS in August.     Follow up with me in six months.   Signed, Rollene Rotunda, MD

## 2023-07-22 ENCOUNTER — Other Ambulatory Visit: Payer: Medicare Other

## 2023-07-23 ENCOUNTER — Encounter: Payer: Self-pay | Admitting: Cardiology

## 2023-07-23 ENCOUNTER — Ambulatory Visit: Payer: Medicare Other | Attending: Cardiology | Admitting: Cardiology

## 2023-07-23 VITALS — BP 138/62 | HR 76 | Wt 152.2 lb

## 2023-07-23 DIAGNOSIS — I480 Paroxysmal atrial fibrillation: Secondary | ICD-10-CM

## 2023-07-23 DIAGNOSIS — I351 Nonrheumatic aortic (valve) insufficiency: Secondary | ICD-10-CM

## 2023-07-23 DIAGNOSIS — I10 Essential (primary) hypertension: Secondary | ICD-10-CM

## 2023-07-23 DIAGNOSIS — I340 Nonrheumatic mitral (valve) insufficiency: Secondary | ICD-10-CM

## 2023-07-23 NOTE — Patient Instructions (Signed)
 Medication Instructions:  No changes.  *If you need a refill on your cardiac medications before your next appointment, please call your pharmacy*  Testing/Procedures: Your physician has requested that you have a carotid duplex in August with LE duplex already ordered.  This test is an ultrasound of the carotid arteries in your neck. It looks at blood flow through these arteries that supply the brain with blood. Allow one hour for this exam. There are no restrictions or special instructions.    Follow-Up: At [facility], you and your health needs are our priority.  As part of our continuing mission to provide you with exceptional heart care, we have created designated Provider Care Teams.  These Care Teams include your primary Cardiologist (physician) and Advanced Practice Providers (APPs -  Physician Assistants and Nurse Practitioners) who all work together to provide you with the care you need, when you need it.  We recommend signing up for the patient portal called "MyChart".  Sign up information is provided on this After Visit Summary.  MyChart is used to connect with patients for Virtual Visits (Telemedicine).  Patients are able to view lab/test results, encounter notes, upcoming appointments, etc.  Non-urgent messages can be sent to your provider as well.   To learn more about what you can do with MyChart, go to [email or web address].    Your next appointment:   6 month(s)  Provider:   Rollene Rotunda, MD     Other Instructions

## 2023-07-25 ENCOUNTER — Inpatient Hospital Stay: Admission: RE | Admit: 2023-07-25 | Payer: Medicare Other | Source: Ambulatory Visit

## 2023-07-29 ENCOUNTER — Ambulatory Visit
Admission: RE | Admit: 2023-07-29 | Discharge: 2023-07-29 | Disposition: A | Discharge status: Home / Self Care | Source: Ambulatory Visit | Attending: Obstetrics and Gynecology | Admitting: Obstetrics and Gynecology

## 2023-07-29 ENCOUNTER — Encounter: Payer: Self-pay | Admitting: Obstetrics and Gynecology

## 2023-07-29 DIAGNOSIS — N644 Mastodynia: Secondary | ICD-10-CM

## 2023-08-05 ENCOUNTER — Other Ambulatory Visit: Payer: Medicare Other

## 2023-08-13 ENCOUNTER — Telehealth: Payer: Self-pay

## 2023-08-13 NOTE — Telephone Encounter (Signed)
 Pt called to schedule her one year f/u sooner. She has c/o BLE foot tingling (R>L) that has been going on the last few days. She feels she has had this in the past and was diagnosed with neuropathy, and also does also have low back pain. I have moved up her f/u and encouraged her to call her PCP to also get seen. Pt was in agreement and will call us if anything changes/worsens.

## 2023-08-14 ENCOUNTER — Telehealth: Payer: Self-pay | Admitting: Internal Medicine

## 2023-08-14 NOTE — Telephone Encounter (Signed)
 Patient called would like to speak with you regarding Dr. Lauro Franklin birthday.

## 2023-08-14 NOTE — Telephone Encounter (Signed)
 Pt states she will bring Dr. Rhea Belton a birthday cake when she comes in for her office visit as he is not in the office on his actual birthday.

## 2023-08-16 ENCOUNTER — Ambulatory Visit

## 2023-08-22 ENCOUNTER — Other Ambulatory Visit: Payer: Self-pay | Admitting: *Deleted

## 2023-08-22 DIAGNOSIS — I739 Peripheral vascular disease, unspecified: Secondary | ICD-10-CM

## 2023-09-02 ENCOUNTER — Ambulatory Visit (HOSPITAL_COMMUNITY)

## 2023-09-04 ENCOUNTER — Encounter: Payer: Self-pay | Admitting: Emergency Medicine

## 2023-09-04 ENCOUNTER — Ambulatory Visit: Payer: Medicare Other | Admitting: Emergency Medicine

## 2023-09-04 VITALS — BP 153/66 | HR 69 | Temp 97.9°F | Ht 61.0 in | Wt 152.6 lb

## 2023-09-04 DIAGNOSIS — R052 Subacute cough: Secondary | ICD-10-CM

## 2023-09-04 DIAGNOSIS — K219 Gastro-esophageal reflux disease without esophagitis: Secondary | ICD-10-CM

## 2023-09-04 DIAGNOSIS — R059 Cough, unspecified: Secondary | ICD-10-CM | POA: Diagnosis not present

## 2023-09-04 DIAGNOSIS — C3411 Malignant neoplasm of upper lobe, right bronchus or lung: Secondary | ICD-10-CM | POA: Diagnosis not present

## 2023-09-04 NOTE — Patient Instructions (Signed)
 Follow with Dr. Jeryl Moris and Dr. Marguerita Shih and have your surveillance CT scans of your chest as planned Continue your Pepcid  as you have been taking it Follow Dr. Baldwin Levee in 1 year, sooner if you have any problems.

## 2023-09-04 NOTE — Progress Notes (Signed)
   Subjective:    Patient ID: Tamara Robertson, female    DOB: [birth year], 89 y.o.   MRN: [id number]  HPI  ROV 09/04/2023 --follow-up for 89 year old woman with a history of an abnormal CT scan of the chest with biapical pleural-parenchymal scarring and a right upper lobe subsolid pulmonary opacity that we have been following with serial imaging.  Also with history of chronic cough in the setting of GERD. Since I last saw her, Dr Marguerita Shih referred her for empiric SBRT without a biopsy. She apparently tolerated well, last was 04/19/23. She feels well, is not having any increased SOB. She has daily fatigue.  Her February Ct chest looked stable to improved with some scar formation RUL. She is due for a CT chest 12/18/23. She still has some chronic cough, through the days and more in the evening. She is on pepcid .     Review of Systems As per HPI      Objective:   Physical Exam Vitals:   09/04/23 1543  BP: (!) 153/66  Pulse: 69  Temp: 97.9 F (36.6 C)  TempSrc: Oral  SpO2: 97%  Weight: 152 lb 9.6 oz (69.2 kg)  Height: 5\' 1"  (1.549 m)   Gen: Pleasant, elderly woman, in no distress,  normal affect  ENT: No lesions,  mouth clear,  oropharynx clear, no postnasal drip  Neck: No JVD, no stridor  Lungs: No use of accessory muscles, no crackles or wheezing on normal respiration, no wheeze on forced expiration  Cardiovascular: RRR, heart sounds normal, no murmur or gallops, trace peripheral edema  Musculoskeletal: No deformities, no cyanosis or clubbing  Neuro: alert, awake, non focal  Skin: Warm, no lesions or rash       Assessment & Plan:   Malignant neoplasm of upper lobe of right lung (HCC) We have been following her right upper lobe subsolid pulmonary opacity with serial imaging and it was increasing in size.  We had decided to be conservative.  She was referred to radiation oncology by hematology oncology and decided to undergo empiric SBRT without a biopsy.  She tolerated it well,  completed 04/19/2023.  She denies any downstream problems.  Initial serial imaging was reassuring and her next CT scan is supposed to be on 12/18/2023.  She will follow-up with hematology oncology and radiation oncology for this.  GERD Continue Pepcid .  Her GERD does impact her cough.  Cough Overall stable.  Treating GERD as her principal contributor.     Racheal Buddle, MD, PhD 09/05/2023, 11:01 AM [facility] [phone] or if no answer before 7:00PM call [phone] For any issues after 7:00PM please call eLink [phone]

## 2023-09-05 NOTE — Assessment & Plan Note (Signed)
 Continue Pepcid .  Her GERD does impact her cough.

## 2023-09-05 NOTE — Assessment & Plan Note (Signed)
 Overall stable.  Treating GERD as her principal contributor.

## 2023-09-05 NOTE — Assessment & Plan Note (Signed)
 We have been following her right upper lobe subsolid pulmonary opacity with serial imaging and it was increasing in size.  We had decided to be conservative.  She was referred to radiation oncology by hematology oncology and decided to undergo empiric SBRT without a biopsy.  She tolerated it well, completed 04/19/2023.  She denies any downstream problems.  Initial serial imaging was reassuring and her next CT scan is supposed to be on 12/18/2023.  She will follow-up with hematology oncology and radiation oncology for this.

## 2023-09-10 ENCOUNTER — Other Ambulatory Visit: Payer: Self-pay | Admitting: Cardiology

## 2023-09-10 DIAGNOSIS — I10 Essential (primary) hypertension: Secondary | ICD-10-CM

## 2023-09-13 ENCOUNTER — Ambulatory Visit

## 2023-09-13 ENCOUNTER — Ambulatory Visit (INDEPENDENT_AMBULATORY_CARE_PROVIDER_SITE_OTHER)

## 2023-09-13 VITALS — BP 140/72 | HR 68 | Ht 61.0 in | Wt 151.0 lb

## 2023-09-13 DIAGNOSIS — Z0000 Encounter for general adult medical examination without abnormal findings: Secondary | ICD-10-CM | POA: Diagnosis not present

## 2023-09-13 NOTE — Progress Notes (Signed)
 Subjective:   Tamara Robertson is a 89 y.o. who presents for a Medicare Wellness preventive visit.  As a reminder, Annual Wellness Visits don't include a physical exam, and some assessments may be limited, especially if this visit is performed virtually. We may recommend an in-person visit if needed.  Visit Complete: In person   Persons Participating in Visit: Patient.  AWV Questionnaire: Yes: Patient Medicare AWV questionnaire was completed by the patient on 09/13/2023; I have confirmed that all information answered by patient is correct and no changes since this date.  Cardiac Risk Factors include: advanced age (>69men, >77 women);hypertension;Other (see comment);dyslipidemia, Risk factor comments: PVD,  CAD     Objective:     Today's Vitals   09/13/23 1341 09/13/23 1509  Weight:  151 lb (68.5 kg)  Height:  5\' 1"  (1.549 m)  PainSc: 8     Body mass index is 28.53 kg/m.     09/13/2023    3:13 PM 03/05/2023    1:44 PM 10/16/2022    3:52 PM 02/17/2022    1:47 PM 11/16/2021    2:08 PM 08/30/2021    4:11 PM 03/21/2021    2:03 PM  Advanced Directives  Does Patient Have a Medical Advance Directive? Yes Yes Yes Yes No Yes Yes  Type of Estate agent of [place];Living will Healthcare Power of [place];Living will    Living will;Healthcare Power of Attorney Living will;Healthcare Power of Attorney  Does patient want to make changes to medical advance directive?      No - Patient declined No - Patient declined  Copy of Healthcare Power of Attorney in Chart? No - copy requested Yes - validated most recent copy scanned in chart (See row information)    No - copy requested No - copy requested  Would patient like information on creating a medical advance directive?  No - Patient declined   No - Patient declined      Current Medications (verified) Outpatient Encounter Medications as of 09/13/2023  Medication Sig   acetaminophen  (TYLENOL ) 500 MG tablet Take 500 mg by mouth  every 6 (six) hours as needed for moderate pain.   ammonium lactate (AMLACTIN) 12 % lotion 1 application Externally Twice a day   Biotin 2500 MCG CAPS Take 1 capsule by mouth daily.    calcium  carbonate (TUMS - DOSED IN MG ELEMENTAL CALCIUM ) 500 MG chewable tablet Chew 1 tablet by mouth daily as needed for indigestion or heartburn.   cholecalciferol (VITAMIN D3) 25 MCG (1000 UNIT) tablet Take 1,000 Units by mouth daily.   clobetasol  ointment (TEMOVATE ) 0.05 % Apply 1 Application topically 2 (two) times daily. Apply topically to the affected area for up to two weeks   escitalopram  (LEXAPRO ) 5 MG tablet TAKE ONE TABLET BY MOUTH ONCE A DAY   famotidine  (PEPCID ) 20 MG tablet TAKE 1 TABLET (20 MG TOTAL) BY MOUTH 2 (TWO) TIMES DAILY AS NEEDED FOR HEARTBURN OR INDIGESTION.   famotidine  (PEPCID ) 40 MG tablet Take 1 tablet (40 mg total) by mouth daily.   Flaxseed MISC Take 5 mLs by mouth daily. Take 1 tsp daily   furosemide  (LASIX ) 20 MG tablet TAKE 1.5 TABLETS BY MOUTH DAILY   lidocaine  (LMX) 4 % cream Apply 1 application  topically daily as needed (inflammation).   lidocaine  4 % Place 1 patch onto the skin daily.   Magnesium  Oxide 250 MG TABS Take 1 tablet by mouth.   metoprolol  succinate (TOPROL -XL) 25 MG 24 hr tablet  TAKE 1/2 (ONE-HALF) TABLET BY MOUTH ONCE DAILY WITH OR IMMEDIATELY FOLLOWING A MEAL   Multiple Vitamin (MULTIVITAMIN) tablet Take 1 tablet by mouth daily.     polyethylene glycol (MIRALAX  / GLYCOLAX ) packet Take 17 g by mouth daily as needed for moderate constipation.    predniSONE  (DELTASONE ) 2.5 MG tablet TAKE 1 TABLET BY MOUTH EVERY OTHER DAY.   Probiotic Product (PROBIOTIC DAILY PO) Take 1 tablet by mouth daily with breakfast.   PSYLLIUM PO Take 1 tablet by mouth daily as needed (constipation).   triamcinolone  cream (KENALOG ) 0.1 % APPLY 1 APPLICATION TOPICALLY 2 (TWO) TIMES DAILY AS NEEDED (RASH).   trolamine salicylate (ASPERCREME) 10 % cream Apply 1 application topically as needed  for muscle pain.   No facility-administered encounter medications on file as of 09/13/2023.    Allergies (verified) Aspirin, Sulfa  antibiotics, Contrast media [iodinated contrast media], Shellfish-derived products, Apixaban , Cephalexin , Fluoxetine , Sulfamethoxazole -trimethoprim , Celebrex [celecoxib], Celexa  [citalopram ], and Statins   History: Past Medical History:  Diagnosis Date   Achilles tendonitis    Acute lower GI bleeding 2023   Anxiety    Aortic atherosclerosis (HCC)    CAD (coronary artery disease)    RCA 40% stenosis cath 01/2011   Diverticular stricture [facility]) 2006   [facility]   DIVERTICULITIS, HX OF    Diverticulosis    DYSLIPIDEMIA    Elevated LFTs    GERD    Glaucoma    Hepatic steatosis    HOH (hard of hearing)    Immune thrombocytopenic purpura (HCC)    chronic - baseline 80-100K, on pred   Irritable bowel syndrome    Left ovarian cyst dx 01/2013 CT   working with gyn, ?malignant - elevated tumor marker OVA1   Lung cancer (HCC) 2023   OSTEOARTHRITIS, KNEE, RIGHT    OSTEOPENIA    OVERACTIVE BLADDER    Plantar fasciitis    Prolapse of female bladder, acquired 11/2017   Torn rotator cuff    right shoulder   URINARY INCONTINENCE    Past Surgical History:  Procedure Laterality Date   ABDOMINAL HYSTERECTOMY  1963   APPENDECTOMY  1956   BIOPSY  02/18/2022   Procedure: BIOPSY;  Surgeon: Normie Becton., MD;  Location: [facility];  Service: Gastroenterology;;   CARDIAC CATHETERIZATION     CATARACT EXTRACTION, BILATERAL  10/2010   CHOLECYSTECTOMY N/A 09/16/2014   Procedure: LAPAROSCOPIC CHOLECYSTECTOMY ;  Surgeon: Enid Harry, MD;  Location: [facility] OR;  Service: General;  Laterality: N/A;   COLONOSCOPY N/A 02/19/2022   Procedure: COLONOSCOPY;  Surgeon: Albertina Hugger, MD;  Location: [facility];  Service: Gastroenterology;  Laterality: N/A;   ESOPHAGOGASTRODUODENOSCOPY N/A 02/18/2022   Procedure: ESOPHAGOGASTRODUODENOSCOPY  (EGD);  Surgeon: Normie Becton., MD;  Location: [facility];  Service: Gastroenterology;  Laterality: N/A;   FLEXIBLE SIGMOIDOSCOPY N/A 02/18/2022   Procedure: FLEXIBLE SIGMOIDOSCOPY;  Surgeon: Brice Campi Albino Alu., MD;  Location: [facility];  Service: Gastroenterology;  Laterality: N/A;   HEMOSTASIS CLIP PLACEMENT  02/19/2022   Procedure: HEMOSTASIS CLIP PLACEMENT;  Surgeon: Albertina Hugger, MD;  Location: [facility];  Service: Gastroenterology;;   KNEE ARTHROSCOPY Right    L pop PTA  10/2009   stent   LAPAROSCOPIC SIGMOID COLECTOMY  10/2005   SPLENECTOMY  1954   VARICOSE VEIN SURGERY Right 1962   Family History  Problem Relation Age of Onset   Coronary artery disease Mother    Heart attack Mother 20   Hyperlipidemia Mother  Hypertension Mother    Stomach cancer Father    Hypertension Daughter    Hyperlipidemia Daughter    Arthritis Other        parent   Transient ischemic attack Other        parent   BRCA 1/2 Neg Hx    Breast cancer Neg Hx    Social History   Socioeconomic History   Marital status: Widowed    Spouse name: Not on file   Number of children: 2   Years of education: Not on file   Highest education level: 12th grade  Occupational History   Occupation: Retired    Comment: Nutritional therapist office  Tobacco Use   Smoking status: Never   Smokeless tobacco: Never  Vaping Use   Vaping status: Never Used  Substance and Sexual Activity   Alcohol use: No   Drug use: No   Sexual activity: Not Currently    Birth control/protection: Surgical    Comment: 1st intercourse- 19, partners- 2, hysterectomy  Other Topics Concern   Not on file  Social History Narrative   Retired Futures trader.    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 to be close to kids      Lives alone/2025   Social Drivers of Health   Financial Resource Strain: Low Risk  (09/13/2023)   Overall Financial Resource Strain (CARDIA)    Difficulty of Paying Living Expenses:  Not hard at all  Food Insecurity: No Food Insecurity (09/13/2023)   Hunger Vital Sign    Worried About Running Out of Food in the Last Year: Never true    Ran Out of Food in the Last Year: Never true  Transportation Needs: Unmet Transportation Needs (09/13/2023)   PRAPARE - Transportation    Lack of Transportation (Medical): Yes    Lack of Transportation (Non-Medical): Yes  Physical Activity: Insufficiently Active (09/13/2023)   Exercise Vital Sign    Days of Exercise per Week: 1 day    Minutes of Exercise per Session: 20 min  Stress: No Stress Concern Present (09/13/2023)   [employer] of Occupational Health - Occupational Stress Questionnaire    Feeling of Stress : Not at all  Social Connections: Moderately Integrated (09/13/2023)   Social Connection and Isolation Panel [NHANES]    Frequency of Communication with Friends and Family: More than three times a week    Frequency of Social Gatherings with Friends and Family: Once a week    Attends Religious Services: More than 4 times per year    Active Member of [employer] or Organizations: Yes    Attends Banker Meetings: More than 4 times per year    Marital Status: Widowed    Tobacco Counseling Counseling given: Not Answered    Clinical Intake:  Pre-visit preparation completed: Yes  Pain : 0-10 Pain Score: 8  Pain Type: Chronic pain Pain Location: Back Pain Descriptors / Indicators: Aching, Discomfort Pain Onset: More than a month ago Pain Frequency: Constant Pain Relieving Factors: Tylenol   Pain Relieving Factors: Tylenol   BMI - recorded: 28.53 Nutritional Status: BMI 25 -29 Overweight Nutritional Risks: None Diabetes: No  Lab Results  Component Value Date   HGBA1C 6.2 07/25/2022   HGBA1C 5.8 12/14/2020   HGBA1C 5.9 07/29/2020     How often do you need to have someone help you when you read instructions, pamphlets, or other written materials from your doctor or pharmacy?: 1 - Never  Interpreter  Needed?: No  Information entered  by :: Pauline Bos, RMA   Activities of Daily Living     09/13/2023    1:41 PM  In your present state of health, do you have any difficulty performing the following activities:  Hearing? 1  Vision? 0  Difficulty concentrating or making decisions? 1  Walking or climbing stairs? 1  Dressing or bathing? 0  Doing errands, shopping? 0  Preparing Food and eating ? N  Using the Toilet? N  In the past six months, have you accidently leaked urine? Y  Do you have problems with loss of bowel control? Y  Managing your Medications? N  Managing your Finances? N  Housekeeping or managing your Housekeeping? Y    Patient Care Team: Colene Dauphin, MD as PCP - General (Internal Medicine) Eilleen Grates, MD as PCP - Cardiology (Cardiology) Alix Aquas, MD as Consulting Physician (Orthopedic Surgery) Eilleen Grates, MD as Consulting Physician (Cardiology) Avis Boehringer, MD as Consulting Physician (Dermatology) Renna Cary, MD (Inactive) (Hematology and Oncology) Andra Kava, MD (Gynecologic Oncology) Emilie Harden, MD (Endocrinology) Pyrtle, Amber Bail, MD as Consulting Physician (Gastroenterology) Szabat, Tino Foreman, [facility] (Inactive) (Pharmacist) Florance Hun, MD as Consulting Physician (Ophthalmology)  Indicate any recent Medical Services you may have received from other than [facility] providers in the past year (date may be approximate).     Assessment:    This is a routine wellness examination for Jesslynn.  Hearing/Vision screen Hearing Screening - Comments:: Wears hearing aides   Goals Addressed             This Visit's Progress    Stay as active and as independent as possible   On track      Depression Screen     09/13/2023    3:14 PM 03/05/2023    2:19 PM 10/30/2022    3:13 PM 07/25/2022    3:24 PM 08/30/2021    3:10 PM 08/05/2020    2:06 PM 04/05/2020    1:45 PM  PHQ 2/9 Scores  PHQ - 2 Score 2 0 0 1 0 0 0  PHQ- 9 Score 4   6        Fall Risk     09/13/2023    1:41 PM 09/04/2023    3:40 PM 10/30/2022    3:13 PM 07/25/2022    3:24 PM 05/14/2022    3:20 PM  Fall Risk   Falls in the past year? 0 0 0 0 0  Number falls in past yr: 0  0 0 0  Injury with Fall? 0  0 0 0  Risk for fall due to : Medication side effect  No Fall Risks No Fall Risks No Fall Risks  Follow up Falls evaluation completed;Falls prevention discussed  Falls evaluation completed Falls evaluation completed Falls evaluation completed    MEDICARE RISK AT HOME:  Medicare Risk at Home Any stairs in or around the home?: (Patient-Rptd) No If so, are there any without handrails?: (Patient-Rptd) No Home free of loose throw rugs in walkways, pet beds, electrical cords, etc?: (Patient-Rptd) No Adequate lighting in your home to reduce risk of falls?: (Patient-Rptd) Yes Life alert?: (Patient-Rptd) Yes Use of a cane, walker or w/c?: (Patient-Rptd) No Grab bars in the bathroom?: (Patient-Rptd) Yes Shower chair or bench in shower?: (Patient-Rptd) Yes Elevated toilet seat or a handicapped toilet?: (Patient-Rptd) No  TIMED UP AND GO:  Was the test performed?  Yes  Length of time to ambulate 10 feet: 25 sec Gait slow and steady without  use of assistive device  Cognitive Function: Declined/Normal: No cognitive concerns noted by patient or family. Patient alert, oriented, able to answer questions appropriately and recall recent events. No signs of memory loss or confusion.    08/30/2021    4:13 PM 12/11/2017    2:46 PM 12/04/2016    5:17 PM  MMSE - Mini Mental State Exam  Orientation to time 5 5 5   Orientation to Place 5 5 5   Registration 3 3 3   Attention/ Calculation 5 5 5   Recall 3 3 1   Language- name 2 objects 2 2 2   Language- repeat 1 1 1   Language- follow 3 step command 3 3 3   Language- read & follow direction 1 1 1   Write a sentence 1 1 1   Copy design 1 1 1   Total score 30 30 28         02/24/2019    3:05 PM  6CIT Screen  What Year? 0 points  What  month? 0 points  What time? 0 points  Count back from 20 0 points  Months in reverse 0 points  Repeat phrase 2 points  Total Score 2 points    Immunizations Immunization History  Administered Date(s) Administered   Fluad Quad(high Dose 65+) 12/31/2018, 01/20/2020, 02/22/2021, 02/05/2023   Influenza Split 02/04/2014, 02/14/2015   Influenza,inj,quad, With Preservative 02/05/2019   Influenza-Unspecified 02/12/2013, 02/06/2016, 02/04/2017, 01/10/2018   Moderna Sars-Covid-2 Vaccination 07/22/2020   PFIZER Comirnaty(Gray Top)Covid-19 Tri-Sucrose Vaccine 01/23/2023   PFIZER(Purple Top)SARS-COV-2 Vaccination 06/13/2019, 07/08/2019, 02/17/2020, 08/22/2020   Pneumococcal Conjugate-13 08/24/2014   Pneumococcal Polysaccharide-23 02/28/2011   Td 02/28/2011   Tdap 06/04/2022   Unspecified SARS-COV-2 Vaccination 06/17/2019, 07/08/2019, 03/11/2022   Zoster Recombinant(Shingrix) 05/23/2017, 10/25/2017   Zoster, Live 04/07/2007    Screening Tests Health Maintenance  Topic Date Due   COVID-19 Vaccine (10 - 2024-25 season) 03/20/2023   INFLUENZA VACCINE  12/06/2023   DEXA SCAN  05/25/2024   Medicare Annual Wellness (AWV)  09/12/2024   DTaP/Tdap/Td (3 - Td or Tdap) 06/04/2032   Pneumonia Vaccine 64+ Years old  Completed   Zoster Vaccines- Shingrix  Completed   HPV VACCINES  Aged Out   Meningococcal B Vaccine  Aged Out    Health Maintenance  Health Maintenance Due  Topic Date Due   COVID-19 Vaccine (10 - 2024-25 season) 03/20/2023   Health Maintenance Items Addressed: See Nurse Notes  Additional Screening:  Vision Screening: Recommended annual ophthalmology exams for early detection of glaucoma and other disorders of the eye.  Dental Screening: Recommended annual dental exams for proper oral hygiene  Community Resource Referral / Chronic Care Management: CRR required this visit?  No   CCM required this visit?  No   Plan:    I have personally reviewed and noted the following  in the patient's chart:   Medical and social history Use of alcohol, tobacco or illicit drugs  Current medications and supplements including opioid prescriptions. Patient is not currently taking opioid prescriptions. Functional ability and status Nutritional status Physical activity Advanced directives List of other physicians Hospitalizations, surgeries, and ER visits in previous 12 months Vitals Screenings to include cognitive, depression, and falls Referrals and appointments  In addition, I have reviewed and discussed with patient certain preventive protocols, quality metrics, and best practice recommendations. A written personalized care plan for preventive services as well as general preventive health recommendations were provided to patient.   Disaya Walt L Sherman Donaldson, CMA   09/13/2023   After Visit Summary: (MyChart) Due to this  being a telephonic visit, the after visit summary with patients personalized plan was offered to patient via MyChart   Notes: Nothing significant to report at this time.

## 2023-09-13 NOTE — Patient Instructions (Signed)
 Ms. Tamara Robertson , Thank you for taking time out of your busy schedule to complete your Annual Wellness Visit with me. I enjoyed our conversation and look forward to speaking with you again next year. I, as well as your care team,  appreciate your ongoing commitment to your health goals. Please review the following plan we discussed and let me know if I can assist you in the future. Your Game plan/ To Do List     Follow up Visits: Next Medicare AWV with our clinical staff: 09/15/2024.   Have you seen your provider in the last 6 months (3 months if uncontrolled diabetes)? Yes Next Office Visit with your provider: Patient stated that she will call to schedule an office visit.  Clinician Recommendations:  Aim for 30 minutes of exercise or brisk walking, 6-8 glasses of water , and 5 servings of fruits and vegetables each day. Keep up the good work.  Remember to call insurance plan to find out about some in home help.      This is a list of the screening recommended for you and due dates:  Health Maintenance  Topic Date Due   Medicare Annual Wellness Visit  08/31/2022   COVID-19 Vaccine (10 - 2024-25 season) 03/20/2023   Flu Shot  12/06/2023   DEXA scan (bone density measurement)  05/25/2024   DTaP/Tdap/Td vaccine (3 - Td or Tdap) 06/04/2032   Pneumonia Vaccine  Completed   Zoster (Shingles) Vaccine  Completed   HPV Vaccine  Aged Out   Meningitis B Vaccine  Aged Out    Advanced directives: (Copy Requested) Please bring a copy of your health care power of attorney and living will to the office to be added to your chart at your convenience. You can mail to [facility] [address] [place], [place] [address] or email to [email or web address] Advance Care Planning is important because it:  [x]  Makes sure you receive the medical care that is consistent with your values, goals, and preferences  [x]  It provides guidance to your family and loved ones and reduces their decisional  burden about whether or not they are making the right decisions based on your wishes.  Follow the link provided in your after visit summary or read over the paperwork we have mailed to you to help you started getting your Advance Directives in place. If you need assistance in completing these, please reach out to us  so that we can help you!

## 2023-09-17 ENCOUNTER — Ambulatory Visit: Admitting: Vascular Surgery

## 2023-09-25 ENCOUNTER — Ambulatory Visit: Payer: Medicare Other | Admitting: Internal Medicine

## 2023-09-25 ENCOUNTER — Encounter: Payer: Self-pay | Admitting: Internal Medicine

## 2023-09-25 VITALS — BP 130/62 | HR 76 | Ht 61.0 in | Wt 150.4 lb

## 2023-09-25 DIAGNOSIS — Z08 Encounter for follow-up examination after completed treatment for malignant neoplasm: Secondary | ICD-10-CM

## 2023-09-25 DIAGNOSIS — R1030 Lower abdominal pain, unspecified: Secondary | ICD-10-CM

## 2023-09-25 DIAGNOSIS — K5900 Constipation, unspecified: Secondary | ICD-10-CM | POA: Diagnosis not present

## 2023-09-25 DIAGNOSIS — R1013 Epigastric pain: Secondary | ICD-10-CM

## 2023-09-25 DIAGNOSIS — Z85118 Personal history of other malignant neoplasm of bronchus and lung: Secondary | ICD-10-CM

## 2023-09-25 DIAGNOSIS — K5731 Diverticulosis of large intestine without perforation or abscess with bleeding: Secondary | ICD-10-CM

## 2023-09-25 DIAGNOSIS — K5909 Other constipation: Secondary | ICD-10-CM

## 2023-09-25 DIAGNOSIS — K219 Gastro-esophageal reflux disease without esophagitis: Secondary | ICD-10-CM

## 2023-09-25 DIAGNOSIS — Z923 Personal history of irradiation: Secondary | ICD-10-CM

## 2023-09-25 MED ORDER — FAMOTIDINE 40 MG PO TABS: 40.0000 mg | ORAL_TABLET | Freq: Every day | ORAL | 11 refills | Status: AC

## 2023-09-25 NOTE — Progress Notes (Signed)
 Subjective:    Patient ID: Tamara Robertson, female    DOB: Jun 21, 1929, 89 y.o.   MRN: [id number]  HPI Tamara Robertson is a 89 year old female with a history of GERD, IBS with chronic constipation, prior GI bleed secondary to diverticulosis, complicated diverticular disease requiring sigmoid resection in 2007, history of small bowel obstruction secondary to adhesive disease, peripheral vascular disease, history of ITP, bronchogenic carcinoma of the right lung with recent radiation therapy who is here for follow-up.  She experiences irregular bowel movements, alternating between diarrhea and constipation. She uses Miralax  daily and adjusts the dosage based on her bowel habits. Additionally, she uses senna tea as needed. She attributes some of her bowel issues to a poor diet, noting that 'old people don't eat well.'  She experiences intermittent heartburn, which is managed with 40 mg of famotidine  once daily. Occasionally, she takes Tums for breakthrough symptoms. Her cough has improved with the management of her reflux.  She has a history of radiation therapy for presumed adenocarcinoma of the lung, with a follow-up scan scheduled for August. A CT scan was performed in February, and she is due for another in August, along with a carotid ultrasound. She also has vascular issues related to her lower legs and ankles, with a vascular study scheduled for June 17th.  She feels 'yucky' and tired, with ongoing issues in her back and knee. Her knee is swollen, and previous gel and cortisone injections have not alleviated the swelling. She believes the knee may need to be drained. She is scheduled to receive a steroid injection in her back, which she has had once before approximately 15-20 years ago. Her back pain sometimes affects her legs, with the pain radiating to the corresponding leg depending on which side of her back is affected.   Famotidine  has been working well to control her GERD.  She will occasionally use  Tums.  Review of Systems As per HPI, otherwise negative  Current Medications, Allergies, Past Medical History, Past Surgical History, Family History and Social History were reviewed in [employer] record.     Objective:   Physical Exam BP 130/62   Pulse 76   Ht 5\' 1"  (1.549 m)   Wt 150 lb 6.4 oz (68.2 kg)   BMI 28.42 kg/m  Gen: awake, alert, NAD HEENT: anicteric  Abd: soft, NT/ND, +BS throughout Ext: no c/c/e Neuro: nonfocal    Latest Ref Rng & Units 02/26/2023    2:30 PM 01/23/2023    3:48 PM 10/12/2022    4:15 PM  CBC  WBC 4.0 - 10.5 K/uL 9.5  11.4  11.3   Hemoglobin 12.0 - 15.0 g/dL 40.9  81.1  91.4   Hematocrit 36.0 - 46.0 % 39.6  41.4  39.8   Platelets 150 - 400 K/uL 168  175.0  178.0    CMP     Component Value Date/Time   NA 140 02/26/2023 1430   NA 143 12/18/2022 1552   NA 142 02/16/2016 1526   K 4.0 02/26/2023 1430   K 4.3 02/16/2016 1526   CL 103 02/26/2023 1430   CL 105 04/17/2012 1452   CO2 32 02/26/2023 1430   CO2 27 02/16/2016 1526   GLUCOSE 103 (H) 02/26/2023 1430   GLUCOSE 184 (H) 02/16/2016 1526   GLUCOSE 122 (H) 04/17/2012 1452   BUN 21 02/26/2023 1430   BUN 20 12/18/2022 1552   BUN 14.3 02/16/2016 1526   CREATININE 0.90 02/26/2023 1430   CREATININE  0.82 01/20/2020 1525   CREATININE 0.8 02/16/2016 1526   CALCIUM  9.6 02/26/2023 1430   CALCIUM  9.5 02/16/2016 1526   PROT 7.3 02/26/2023 1430   PROT 7.4 02/16/2016 1526   ALBUMIN 4.1 02/26/2023 1430   ALBUMIN 3.7 02/16/2016 1526   AST 22 02/26/2023 1430   AST 19 02/16/2016 1526   ALT 15 02/26/2023 1430   ALT 11 02/16/2016 1526   ALKPHOS 58 02/26/2023 1430   ALKPHOS 92 02/16/2016 1526   BILITOT 0.5 02/26/2023 1430   BILITOT 0.33 02/16/2016 1526   GFR 61.87 07/25/2022 1619   EGFR 51 (L) 12/18/2022 1552   GFRNONAA >60 02/26/2023 1430   GFRNONAA 64 01/20/2020 1525       Assessment & Plan:  89 year old female with a history of GERD, IBS with chronic constipation,  prior GI bleed secondary to diverticulosis, complicated diverticular disease requiring sigmoid resection in 2007, history of small bowel obstruction secondary to adhesive disease, peripheral vascular disease, history of ITP, bronchogenic carcinoma of the right lung with recent radiation therapy who is here for follow-up.  Gastroesophageal reflux disease (GERD) GERD with intermittent heartburn, improved with famotidine  and Tums. - Continue famotidine  40 mg once daily. - Use Tums as needed for breakthrough heartburn. - Consider additional evening dose of famotidine  if heartburn persists.  Constipation with intermittent lower abdominal discomfort Constipation with intermittent diarrhea managed with Miralax  and senna tea. Adjusts Miralax  based on bowel movement consistency. - Continue Miralax  as needed. - Use senna tea as needed for constipation. - Monitor and adjust dietary intake to manage bowel consistency.  Post-radiation follow-up for cancer Post-radiation follow-up well-managed. Next scan scheduled for August. - Proceed with scheduled CT scan in August. - Ensure follow-up with oncology as needed.  30 minutes total spent today including patient facing time, coordination of care, reviewing medical history/procedures/pertinent radiology studies, and documentation of the encounter.

## 2023-09-25 NOTE — Patient Instructions (Signed)
 We have sent the following medications to your pharmacy for you to pick up at your convenience: famotidine  40 mg daily.   Remain on Miralax  daily and can you take over the counter Tums as needed.   Our office will contact you to schedule an appointment when the September schedule becomes available. _______________________________________________________  If your blood pressure at your visit was 140/90 or greater, please contact your primary care physician to follow up on this.  _______________________________________________________  If you are age 31 or older, your body mass index should be between 23-30. Your Body mass index is 28.42 kg/m. If this is out of the aforementioned range listed, please consider follow up with your Primary Care Provider.  If you are age 77 or younger, your body mass index should be between 19-25. Your Body mass index is 28.42 kg/m. If this is out of the aformentioned range listed, please consider follow up with your Primary Care Provider.   ________________________________________________________  The [facility] providers would like to encourage you to use MYCHART to communicate with providers for non-urgent requests or questions.  Due to long hold times on the telephone, sending your provider a message by [facility] may be a faster and more efficient way to get a response.  Please allow 48 business hours for a response.  Please remember that this is for non-urgent requests.  _______________________________________________________

## 2023-09-27 ENCOUNTER — Encounter (HOSPITAL_COMMUNITY)

## 2023-10-08 ENCOUNTER — Ambulatory Visit: Admitting: Vascular Surgery

## 2023-10-22 ENCOUNTER — Ambulatory Visit (HOSPITAL_COMMUNITY): Admission: RE | Admit: 2023-10-22 | Source: Ambulatory Visit

## 2023-10-22 ENCOUNTER — Ambulatory Visit: Admitting: Vascular Surgery

## 2023-10-25 ENCOUNTER — Ambulatory Visit: Admitting: Internal Medicine

## 2023-11-11 ENCOUNTER — Other Ambulatory Visit: Payer: Self-pay | Admitting: Internal Medicine

## 2023-11-12 ENCOUNTER — Ambulatory Visit

## 2023-12-09 NOTE — Progress Notes (Unsigned)
 [facility] [place] PROGRESS NOTE  ASSESSMENT / PLAN: Tamara Robertson is a 89 y.o. female with cramping discomfort of bilateral lower extremities. We reviewed her noninvasive testing today.  She prefers follow-up with me in 1 year for surveillance of known mild peripheral arterial disease.  SUBJECTIVE: Returns to clinic for evaluation.  She is doing well overall.  She does report some cramping discomfort in her legs, which is not limiting her lifestyle.  She is contemplating moving to a retirement community.  She still living independently at 89 years old!  OBJECTIVE: There were no vitals taken for this visit. Well-appearing in no acute distress Regular rate and rhythm Unlabored breathing 1+ dorsalis pedis pulse bilaterally.     Latest Ref Rng & Units 02/26/2023    2:30 PM 01/23/2023    3:48 PM 10/12/2022    4:15 PM  CBC  WBC 4.0 - 10.5 K/uL 9.5  11.4  11.3   Hemoglobin 12.0 - 15.0 g/dL 86.7  86.4  87.1   Hematocrit 36.0 - 46.0 % 39.6  41.4  39.8   Platelets 150 - 400 K/uL 168  175.0  178.0         Latest Ref Rng & Units 02/26/2023    2:30 PM 12/18/2022    3:52 PM 07/25/2022    4:19 PM  CMP  Glucose 70 - 99 mg/dL 896  93  92   BUN 8 - 23 mg/dL 21  20  18    Creatinine 0.44 - 1.00 mg/dL 9.09  8.95  9.16   Sodium 135 - 145 mmol/L 140  143  139   Potassium 3.5 - 5.1 mmol/L 4.0  4.8  4.2   Chloride 98 - 111 mmol/L 103  100  99   CO2 22 - 32 mmol/L 32  26  32   Calcium  8.9 - 10.3 mg/dL 9.6  9.6  9.5   Total Protein 6.5 - 8.1 g/dL 7.3   7.6   Total Bilirubin 0.3 - 1.2 mg/dL 0.5   0.4   Alkaline Phos 38 - 126 U/L 58   74   AST 15 - 41 U/L 22   19   ALT 0 - 44 U/L 15   13     +-------+-----------+-----------+------------+------------+  ABI/TBIToday's ABIToday's TBIPrevious ABIPrevious TBI  +-------+-----------+-----------+------------+------------+  Right .96        .36        .80         .47            +-------+-----------+-----------+------------+------------+  Left  .93        .34        .80         .36           +-------+-----------+-----------+------------+------------+    Debby SAILOR. Magda, MD [facility] Phone Number: [phone] 12/09/2023 4:06 PM

## 2023-12-10 ENCOUNTER — Ambulatory Visit (HOSPITAL_COMMUNITY)
Admission: RE | Admit: 2023-12-10 | Discharge: 2023-12-10 | Disposition: A | Discharge status: Home / Self Care | Source: Ambulatory Visit | Attending: Vascular Surgery | Admitting: Vascular Surgery

## 2023-12-10 ENCOUNTER — Encounter: Payer: Self-pay | Admitting: Vascular Surgery

## 2023-12-10 ENCOUNTER — Ambulatory Visit: Attending: Vascular Surgery | Admitting: Vascular Surgery

## 2023-12-10 VITALS — BP 154/75 | HR 65 | Temp 97.9°F | Ht 61.0 in | Wt 148.0 lb

## 2023-12-10 DIAGNOSIS — I739 Peripheral vascular disease, unspecified: Secondary | ICD-10-CM | POA: Diagnosis not present

## 2023-12-10 LAB — VAS US ABI WITH/WO TBI
Left ABI: 0.93
Right ABI: 0.96

## 2023-12-16 ENCOUNTER — Other Ambulatory Visit: Payer: Self-pay | Admitting: Medical Oncology

## 2023-12-16 DIAGNOSIS — Z888 Allergy status to other drugs, medicaments and biological substances status: Secondary | ICD-10-CM

## 2023-12-16 MED ORDER — PREDNISONE 50 MG PO TABS: ORAL_TABLET | ORAL | 3 refills | Status: DC

## 2023-12-16 NOTE — Telephone Encounter (Signed)
 Pt requested refill for prednisone  premed prior to getting CT dye due to breaking out in hives .

## 2023-12-18 ENCOUNTER — Ambulatory Visit (HOSPITAL_BASED_OUTPATIENT_CLINIC_OR_DEPARTMENT_OTHER)

## 2023-12-18 ENCOUNTER — Ambulatory Visit: Payer: Self-pay | Admitting: Cardiology

## 2023-12-18 ENCOUNTER — Ambulatory Visit (HOSPITAL_BASED_OUTPATIENT_CLINIC_OR_DEPARTMENT_OTHER)
Admission: RE | Admit: 2023-12-18 | Discharge: 2023-12-18 | Disposition: A | Discharge status: Home / Self Care | Payer: Medicare Other | Source: Ambulatory Visit | Attending: Physician Assistant | Admitting: Physician Assistant

## 2023-12-18 ENCOUNTER — Inpatient Hospital Stay: Payer: Medicare Other | Attending: Physician Assistant

## 2023-12-18 DIAGNOSIS — C3490 Malignant neoplasm of unspecified part of unspecified bronchus or lung: Secondary | ICD-10-CM | POA: Diagnosis present

## 2023-12-18 DIAGNOSIS — Z923 Personal history of irradiation: Secondary | ICD-10-CM | POA: Insufficient documentation

## 2023-12-18 DIAGNOSIS — Z85118 Personal history of other malignant neoplasm of bronchus and lung: Secondary | ICD-10-CM | POA: Insufficient documentation

## 2023-12-18 DIAGNOSIS — I340 Nonrheumatic mitral (valve) insufficiency: Secondary | ICD-10-CM | POA: Diagnosis not present

## 2023-12-18 DIAGNOSIS — D693 Immune thrombocytopenic purpura: Secondary | ICD-10-CM | POA: Insufficient documentation

## 2023-12-18 LAB — CBC WITH DIFFERENTIAL (CANCER CENTER ONLY)
Abs Immature Granulocytes: 0.08 K/uL — ABNORMAL HIGH (ref 0.00–0.07)
Basophils Absolute: 0 K/uL (ref 0.0–0.1)
Basophils Relative: 0 %
Eosinophils Absolute: 0 K/uL (ref 0.0–0.5)
Eosinophils Relative: 0 %
HCT: 41 % (ref 36.0–46.0)
Hemoglobin: 13.4 g/dL (ref 12.0–15.0)
Immature Granulocytes: 1 %
Lymphocytes Relative: 18 %
Lymphs Abs: 1.9 K/uL (ref 0.7–4.0)
MCH: 27.7 pg (ref 26.0–34.0)
MCHC: 32.7 g/dL (ref 30.0–36.0)
MCV: 84.9 fL (ref 80.0–100.0)
Monocytes Absolute: 0.2 K/uL (ref 0.1–1.0)
Monocytes Relative: 2 %
Neutro Abs: 8.6 K/uL — ABNORMAL HIGH (ref 1.7–7.7)
Neutrophils Relative %: 79 %
Platelet Count: 172 K/uL (ref 150–400)
RBC: 4.83 MIL/uL (ref 3.87–5.11)
RDW: 14.3 % (ref 11.5–15.5)
WBC Count: 10.8 K/uL — ABNORMAL HIGH (ref 4.0–10.5)
nRBC: 0 % (ref 0.0–0.2)

## 2023-12-18 LAB — CMP (CANCER CENTER ONLY)
ALT: 15 U/L (ref 0–44)
AST: 25 U/L (ref 15–41)
Albumin: 4.3 g/dL (ref 3.5–5.0)
Alkaline Phosphatase: 78 U/L (ref 38–126)
Anion gap: 14 (ref 5–15)
BUN: 16 mg/dL (ref 8–23)
CO2: 25 mmol/L (ref 22–32)
Calcium: 9.5 mg/dL (ref 8.9–10.3)
Chloride: 101 mmol/L (ref 98–111)
Creatinine: 0.89 mg/dL (ref 0.44–1.00)
GFR, Estimated: >60 mL/min (ref >60)
Glucose, Bld: 135 mg/dL — ABNORMAL HIGH (ref 70–99)
Potassium: 4.4 mmol/L (ref 3.5–5.1)
Sodium: 140 mmol/L (ref 135–145)
Total Bilirubin: 0.3 mg/dL (ref 0.0–1.2)
Total Protein: 7.5 g/dL (ref 6.5–8.1)

## 2023-12-18 MED ORDER — IOHEXOL 300 MG/ML  SOLN
100.0000 mL | Freq: Once | INTRAMUSCULAR | Status: AC | PRN
  Administered 2023-12-18 (×2): 75 mL via INTRAVENOUS

## 2023-12-24 ENCOUNTER — Ambulatory Visit: Payer: Medicare Other | Admitting: Internal Medicine

## 2023-12-24 ENCOUNTER — Other Ambulatory Visit: Payer: Medicare Other

## 2023-12-25 ENCOUNTER — Inpatient Hospital Stay (HOSPITAL_BASED_OUTPATIENT_CLINIC_OR_DEPARTMENT_OTHER): Payer: Medicare Other | Admitting: Internal Medicine

## 2023-12-25 VITALS — BP 146/54 | HR 70 | Temp 97.9°F | Resp 16 | Ht 61.0 in | Wt 149.0 lb

## 2023-12-25 DIAGNOSIS — D693 Immune thrombocytopenic purpura: Secondary | ICD-10-CM | POA: Diagnosis not present

## 2023-12-25 DIAGNOSIS — C3411 Malignant neoplasm of upper lobe, right bronchus or lung: Secondary | ICD-10-CM

## 2023-12-25 NOTE — Progress Notes (Signed)
 [facility] Telephone:([phone]) [phone]   [phone]) [phone]  OFFICE PROGRESS NOTE  Geofm Glade PARAS, MD [address] [place] [address]  DIAGNOSIS:  1) suprahilar right upper lobe low-grade adenocarcinoma with  consolidation likely secondary to previous radiotherapy 2) history of ITP diagnosed in 2000 status post splenectomy and treatment with high-dose steroids at that time.  PRIOR THERAPY: Status post SBRT for suspected slowly growing adenocarcinoma in the right suprahilar area under the care of Dr. Dewey.  CURRENT THERAPY: Prednisone  2.5 mg every other day.  INTERVAL HISTORY: Tamara Robertson 89 y.o. female returns to the clinic today for follow-up visit. Discussed the use of AI scribe software for clinical note transcription with the patient, who gave verbal consent to proceed.  History of Present Illness Tamara Robertson is a 89 year old female with low grade adenocarcinoma and immune thrombocytopenic purpura who presents for evaluation with repeat blood work and CT scan for restaging of her disease.  She has a history of suspicious right upper lobe low grade adenocarcinoma and is status post stereotactic body radiation therapy (SBRT). She is currently in an observation period and presented for evaluation with repeat blood work and a CT scan of the chest for restaging of her disease.  She has a history of immune thrombocytopenic purpura (ITP) diagnosed in 2000, for which she underwent a splenectomy and was treated with high dose steroids. She is currently on a maintenance dose of prednisone , 2.5 mg every other day.  She experiences occasional palpitations and shortness of breath, which she manages by sitting down and breathing. No atrial fibrillation or regular heart rate issues. She sometimes falls asleep after these episodes.     MEDICAL HISTORY: Past Medical History:  Diagnosis Date   Achilles tendonitis    Acute lower GI bleeding 2023   Anxiety    Aortic  atherosclerosis (HCC)    CAD (coronary artery disease)    RCA 40% stenosis cath 01/2011   Diverticular stricture [facility]) 2006   [facility]   DIVERTICULITIS, HX OF    Diverticulosis    DYSLIPIDEMIA    Elevated LFTs    GERD    Glaucoma    Hepatic steatosis    HOH (hard of hearing)    Immune thrombocytopenic purpura (HCC)    chronic - baseline 80-100K, on pred   Irritable bowel syndrome    Left ovarian cyst dx 01/2013 CT   working with gyn, ?malignant - elevated tumor marker OVA1   Lung cancer (HCC) 2023   OSTEOARTHRITIS, KNEE, RIGHT    OSTEOPENIA    OVERACTIVE BLADDER    Plantar fasciitis    Prolapse of female bladder, acquired 11/2017   Torn rotator cuff    right shoulder   URINARY INCONTINENCE     ALLERGIES:  is allergic to aspirin, sulfa  antibiotics, contrast media [iodinated contrast media], shellfish-derived products, apixaban , cephalexin , fluoxetine , sulfamethoxazole -trimethoprim , celebrex [celecoxib], celexa  [citalopram ], and statins.  MEDICATIONS:  Current Outpatient Medications  Medication Sig Dispense Refill   acetaminophen  (TYLENOL ) 500 MG tablet Take 500 mg by mouth every 6 (six) hours as needed for moderate pain.     ammonium lactate (AMLACTIN) 12 % lotion 1 application Externally Twice a day     Biotin 2500 MCG CAPS Take 1 capsule by mouth daily.      calcium  carbonate (TUMS - DOSED IN MG ELEMENTAL CALCIUM ) 500 MG chewable tablet Chew 1 tablet by mouth daily as needed for indigestion  or heartburn.     cholecalciferol (VITAMIN D3) 25 MCG (1000 UNIT) tablet Take 1,000 Units by mouth daily.     clobetasol  ointment (TEMOVATE ) 0.05 % Apply 1 Application topically 2 (two) times daily. Apply topically to the affected area for up to two weeks 30 g 0   escitalopram  (LEXAPRO ) 5 MG tablet TAKE ONE TABLET BY MOUTH ONCE A DAY 90 tablet 0   famotidine  (PEPCID ) 40 MG tablet Take 1 tablet (40 mg total) by mouth daily. 30 tablet 11   Flaxseed MISC Take 5 mLs by  mouth daily. Take 1 tsp daily     furosemide  (LASIX ) 20 MG tablet TAKE 1.5 TABLETS BY MOUTH DAILY 135 tablet 3   lidocaine  (LMX) 4 % cream Apply 1 application  topically daily as needed (inflammation).     lidocaine  4 % Place 1 patch onto the skin daily.     Magnesium  Oxide 250 MG TABS Take 1 tablet by mouth.     metoprolol  succinate (TOPROL -XL) 25 MG 24 hr tablet TAKE 1/2 (ONE-HALF) TABLET BY MOUTH ONCE DAILY WITH OR IMMEDIATELY FOLLOWING A MEAL 45 tablet 2   Multiple Vitamin (MULTIVITAMIN) tablet Take 1 tablet by mouth daily.       polyethylene glycol (MIRALAX  / GLYCOLAX ) packet Take 17 g by mouth daily as needed for moderate constipation.      predniSONE  (DELTASONE ) 2.5 MG tablet TAKE 1 TABLET BY MOUTH EVERY OTHER DAY. 45 tablet 3   predniSONE  (DELTASONE ) 50 MG tablet Take one tablet 13 hours ,one tablet seven hours and one tablet one hour prior to CT scan. Take Benadryl  25 mg one hours prior to CT scan. 3 tablet 3   Probiotic Product (PROBIOTIC DAILY PO) Take 1 tablet by mouth daily with breakfast.     PSYLLIUM PO Take 1 tablet by mouth daily as needed (constipation).     triamcinolone  cream (KENALOG ) 0.1 % APPLY 1 APPLICATION TOPICALLY 2 (TWO) TIMES DAILY AS NEEDED (RASH). 30 g 0   trolamine salicylate (ASPERCREME) 10 % cream Apply 1 application topically as needed for muscle pain.     No current facility-administered medications for this visit.    SURGICAL HISTORY:  Past Surgical History:  Procedure Laterality Date   ABDOMINAL HYSTERECTOMY  1963   APPENDECTOMY  1956   BIOPSY  02/18/2022   Procedure: BIOPSY;  Surgeon: Wilhelmenia Aloha Raddle., MD;  Location: [facility];  Service: Gastroenterology;;   CARDIAC CATHETERIZATION     CATARACT EXTRACTION, BILATERAL  10/2010   CHOLECYSTECTOMY N/A 09/16/2014   Procedure: LAPAROSCOPIC CHOLECYSTECTOMY ;  Surgeon: Donnice Bury, MD;  Location: [facility] OR;  Service: General;  Laterality: N/A;   COLONOSCOPY N/A 02/19/2022   Procedure:  COLONOSCOPY;  Surgeon: Legrand Victory LITTIE DOUGLAS, MD;  Location: [facility];  Service: Gastroenterology;  Laterality: N/A;   ESOPHAGOGASTRODUODENOSCOPY N/A 02/18/2022   Procedure: ESOPHAGOGASTRODUODENOSCOPY (EGD);  Surgeon: Wilhelmenia Aloha Raddle., MD;  Location: [facility];  Service: Gastroenterology;  Laterality: N/A;   FLEXIBLE SIGMOIDOSCOPY N/A 02/18/2022   Procedure: FLEXIBLE SIGMOIDOSCOPY;  Surgeon: Wilhelmenia Aloha Raddle., MD;  Location: [facility];  Service: Gastroenterology;  Laterality: N/A;   HEMOSTASIS CLIP PLACEMENT  02/19/2022   Procedure: HEMOSTASIS CLIP PLACEMENT;  Surgeon: Legrand Victory LITTIE DOUGLAS, MD;  Location: [facility];  Service: Gastroenterology;;   KNEE ARTHROSCOPY Right    L pop PTA  10/2009   stent   LAPAROSCOPIC SIGMOID COLECTOMY  10/2005   SPLENECTOMY  1954   VARICOSE VEIN SURGERY Right 1962    REVIEW  OF SYSTEMS:  A comprehensive review of systems was negative except for: Constitutional: positive for fatigue Respiratory: positive for dyspnea on exertion   PHYSICAL EXAMINATION: General appearance: alert, cooperative, fatigued, and no distress Head: Normocephalic, without obvious abnormality, atraumatic Neck: no adenopathy, no JVD, supple, symmetrical, trachea midline, and thyroid  not enlarged, symmetric, no tenderness/mass/nodules Lymph nodes: Cervical, supraclavicular, and axillary nodes normal. Resp: clear to auscultation bilaterally Back: symmetric, no curvature. ROM normal. No CVA tenderness. Cardio: regular rate and rhythm, S1, S2 normal, no murmur, click, rub or gallop GI: soft, non-tender; bowel sounds normal; no masses,  no organomegaly Extremities: extremities normal, atraumatic, no cyanosis or edema  ECOG PERFORMANCE STATUS: 1 - Symptomatic but completely ambulatory  Blood pressure (!) 146/54, pulse 70, temperature 97.9 F (36.6 C), temperature source Temporal, resp. rate 16, height 5' 1 (1.549 m), weight 149 lb (67.6 kg), SpO2 98%.  LABORATORY  DATA: Lab Results  Component Value Date   WBC 10.8 (H) 12/18/2023   HGB 13.4 12/18/2023   HCT 41.0 12/18/2023   MCV 84.9 12/18/2023   PLT 172 12/18/2023      Chemistry      Component Value Date/Time   NA 140 12/18/2023 1334   NA 143 12/18/2022 1552   NA 142 02/16/2016 1526   K 4.4 12/18/2023 1334   K 4.3 02/16/2016 1526   CL 101 12/18/2023 1334   CL 105 04/17/2012 1452   CO2 25 12/18/2023 1334   CO2 27 02/16/2016 1526   BUN 16 12/18/2023 1334   BUN 20 12/18/2022 1552   BUN 14.3 02/16/2016 1526   CREATININE 0.89 12/18/2023 1334   CREATININE 0.82 01/20/2020 1525   CREATININE 0.8 02/16/2016 1526      Component Value Date/Time   CALCIUM  9.5 12/18/2023 1334   CALCIUM  9.5 02/16/2016 1526   ALKPHOS 78 12/18/2023 1334   ALKPHOS 92 02/16/2016 1526   AST 25 12/18/2023 1334   AST 19 02/16/2016 1526   ALT 15 12/18/2023 1334   ALT 11 02/16/2016 1526   BILITOT 0.3 12/18/2023 1334   BILITOT 0.33 02/16/2016 1526       RADIOGRAPHIC STUDIES: CT Chest W Contrast Result Date: 12/22/2023 CLINICAL DATA:  Non-small cell lung cancer restaging * Tracking Code: BO * EXAM: CT CHEST WITH CONTRAST TECHNIQUE: Multidetector CT imaging of the chest was performed during intravenous contrast administration. RADIATION DOSE REDUCTION: This exam was performed according to the departmental dose-optimization program which includes automated exposure control, adjustment of the mA and/or kV according to patient size and/or use of iterative reconstruction technique. CONTRAST:  75mL OMNIPAQUE  IOHEXOL  300 MG/ML  SOLN COMPARISON:  06/14/2023 FINDINGS: Cardiovascular: Aortic atherosclerosis. Normal heart size. Three-vessel coronary artery calcifications. No pericardial effusion. Mediastinum/Nodes: No enlarged mediastinal, hilar, or axillary lymph nodes. Thyroid  gland, trachea, and esophagus demonstrate no significant findings. Lungs/Pleura: New bandlike consolidation of the suprahilar right upper lobe about a  previously seen spiculated subsolid nodule, with extensive irregular ground-glass and small adjacent consolidations (series 3, image 34). Nodule is no longer discretely visible, however consolidation in this vicinity now measures 4.5 x 3.7 cm (series 3, image 64). Unchanged bandlike scarring and fibrotic bronchiectasis of the left lower lobe (series 3, image 48). Unchanged irregular ground-glass nodule of the paramedian right lower lobe measuring 0.8 x 0.4 cm (series 3, image 46). Unchanged irregular, somewhat amorphous subsolid nodule of the inferior right lower lobe, approximately 1.8 x 1.1 cm (series 3, image 81). No pleural effusion or pneumothorax. Upper Abdomen: No acute abnormality.  Hepatic steatosis. Musculoskeletal: No chest wall abnormality. No acute osseous findings. IMPRESSION: 1. New bandlike consolidation of the suprahilar right upper lobe about a previously seen spiculated subsolid nodule, with extensive irregular ground-glass and small adjacent consolidations. Nodule is no longer discretely visible. Findings are consistent with developing radiation pneumonitis and fibrosis. Attention on follow-up. 2. Unchanged bandlike scarring and fibrotic bronchiectasis of the left lower lobe. 3. Additional unchanged irregular subsolid nodules. Attention on follow-up. 4. No evidence of lymphadenopathy or metastatic disease in the chest. 5. Coronary artery disease. 6. Hepatic steatosis. Aortic Atherosclerosis (ICD10-I70.0). Electronically Signed   By: Marolyn JONETTA Jaksch M.D.   On: 12/22/2023 07:26   VAS US  CAROTID Result Date: 12/18/2023 Carotid Arterial Duplex Study Patient Name:  KEEANNA VILLAFRANCA  Date of Exam:   12/18/2023 Medical Rec #: [id number]     Accession #:    [id number] Date of Birth: [birth year]      Patient Gender: F Patient Age:   75 years Exam Location:  [facility] Procedure:      VAS US  CAROTID Referring Phys: LYNWOOD SCHILLING --------------------------------------------------------------------------------   Indications:       Left bruit and patient denies any cerebrovascular symptoms. Risk Factors:      Hypertension, hyperlipidemia, no history of smoking. Comparison Study:  NA Performing Technologist: Orvil Holmes RDCS  Examination Guidelines: A complete evaluation includes B-mode imaging, spectral Doppler, color Doppler, and power Doppler as needed of all accessible portions of each vessel. Bilateral testing is considered an integral part of a complete examination. Limited examinations for reoccurring indications may be performed as noted.  Right Carotid Findings: +----------+--------+--------+--------+------------------+--------+           PSV cm/sEDV cm/sStenosisPlaque DescriptionComments +----------+--------+--------+--------+------------------+--------+ CCA Prox  58      12                                tortuous +----------+--------+--------+--------+------------------+--------+ CCA Distal70      15                                         +----------+--------+--------+--------+------------------+--------+ ICA Prox  50      12              smooth                     +----------+--------+--------+--------+------------------+--------+ ICA Mid   77      21                                         +----------+--------+--------+--------+------------------+--------+ ICA Distal39      10                                         +----------+--------+--------+--------+------------------+--------+ ECA       62      8                                          +----------+--------+--------+--------+------------------+--------+ +----------+--------+-------+---------+-------------------+           PSV cm/sEDV cmsDescribe Arm  Pressure (mmHG) +----------+--------+-------+---------+-------------------+ [id number]     6      Turbulent                    +----------+--------+-------+---------+-------------------+ +---------+--------+--+--------+-+---------+  VertebralPSV cm/s38EDV cm/s8Antegrade +---------+--------+--+--------+-+---------+  Left Carotid Findings: +----------+--------+--------+--------+------------------+--------+           PSV cm/sEDV cm/sStenosisPlaque DescriptionComments +----------+--------+--------+--------+------------------+--------+ CCA Prox  173     21                                         +----------+--------+--------+--------+------------------+--------+ CCA Mid   114     19                                         +----------+--------+--------+--------+------------------+--------+ CCA Distal82      17                                         +----------+--------+--------+--------+------------------+--------+ ICA Prox  69      7               heterogenous               +----------+--------+--------+--------+------------------+--------+ ICA Mid   78      19                                         +----------+--------+--------+--------+------------------+--------+ ICA Distal75      19                                         +----------+--------+--------+--------+------------------+--------+ ECA       171     15                                         +----------+--------+--------+--------+------------------+--------+ +----------+--------+--------+----------------+-------------------+           PSV cm/sEDV cm/sDescribe        Arm Pressure (mmHG) +----------+--------+--------+----------------+-------------------+ [id number]     9       Multiphasic, WNL                    +----------+--------+--------+----------------+-------------------+ +---------+--------+--+--------+--+---------+ VertebralPSV cm/s68EDV cm/s12Antegrade +---------+--------+--+--------+--+---------+   Summary: Right Carotid: The extracranial vessels were near-normal with only minimal wall                thickening or plaque. Left Carotid: The extracranial vessels were near-normal with only minimal  wall               thickening or plaque. Vertebrals:  Bilateral vertebral arteries demonstrate antegrade flow. Subclavians: Normal flow hemodynamics were seen in bilateral subclavian              arteries. *See table(s) above for measurements and observations.  Electronically signed by Dorn Lesches MD on 12/18/2023 at 4:58:11 PM.    Final    VAS US  ABI WITH/WO TBI Result Date: 12/10/2023  LOWER EXTREMITY DOPPLER  STUDY Patient Name:  FAREEHA EVON  Date of Exam:   12/10/2023 Medical Rec #: [id number]     Accession #:    [id number] Date of Birth: [birth year]      Patient Gender: F Patient Age:   72 years Exam Location:  [address] Procedure:      VAS US  ABI WITH/WO TBI Referring Phys: DEBBY ROBERTSON --------------------------------------------------------------------------------  Indications: Claudication, and peripheral artery disease. Patient unable to walk              more than 5-10 minutes without pain in calves and feet. She denies              rest pain but does have intermittent nocturnal calf cramping. High Risk Factors: Hypertension, hyperlipidemia, no history of smoking, coronary                    artery disease.  Comparison Study: 01/01/2023 right .80 left .80 Performing Technologist: Horton Fairy RVT, RDCS,RDMS,RT(R)  Examination Guidelines: A complete evaluation includes at minimum, Doppler waveform signals and systolic blood pressure reading at the level of bilateral brachial, anterior tibial, and posterior tibial arteries, when vessel segments are accessible. Bilateral testing is considered an integral part of a complete examination. Photoelectric Plethysmograph (PPG) waveforms and toe systolic pressure readings are included as required and additional duplex testing as needed. Limited examinations for reoccurring indications may be performed as noted.  ABI Findings: +---------+------------------+-----+----------+--------+ Right    Rt Pressure (mmHg)IndexWaveform  Comment   +---------+------------------+-----+----------+--------+ Brachial 149                                       +---------+------------------+-----+----------+--------+ PTA      156               0.96 monophasic         +---------+------------------+-----+----------+--------+ DP       154               0.94 monophasic         +---------+------------------+-----+----------+--------+ Great Toe58                0.36 Abnormal           +---------+------------------+-----+----------+--------+ +---------+------------------+-----+----------+-------+ Left     Lt Pressure (mmHg)IndexWaveform  Comment +---------+------------------+-----+----------+-------+ Brachial 163                                      +---------+------------------+-----+----------+-------+ PTA      144               0.88 monophasic        +---------+------------------+-----+----------+-------+ DP       152               0.93 monophasic        +---------+------------------+-----+----------+-------+ Great Toe55                0.34 Abnormal          +---------+------------------+-----+----------+-------+ +-------+-----------+-----------+------------+------------+ ABI/TBIToday's ABIToday's TBIPrevious ABIPrevious TBI +-------+-----------+-----------+------------+------------+ Right  .96        .36        .80         .47          +-------+-----------+-----------+------------+------------+ Left   .93        .34        .  80         .36          +-------+-----------+-----------+------------+------------+  Bilateral ABIs appear increased compared to prior study on 01/01/2023.  Summary: Right: The right toe-brachial index is abnormal. Although ankle brachial indices are within normal limits (0.95-1.29), arterial Doppler waveforms at the ankle suggest some component of arterial occlusive disease. Left: Resting left ankle-brachial index indicates mild left lower extremity arterial disease. The left  toe-brachial index is abnormal. *See table(s) above for measurements and observations.  Electronically signed by Debby Robertson on 12/10/2023 at 5:30:28 PM.    Final     ASSESSMENT AND PLAN:  Assessment and Plan Assessment & Plan Right upper lobe lung adenocarcinoma, post-SBRT, under surveillance Status post stereotactic body radiation therapy (SBRT) for right upper lobe lung adenocarcinoma. Current imaging shows no new growth or spread, only consolidation and scarring from radiation. No evidence of lymphadenopathy or metastatic disease. - Continue surveillance with CT scan of the chest in six months - Perform lab work a week before the next appointment  Radiation-induced pneumonitis and fibrosis, right lung Developing radiation pneumonitis and fibrosis in the right lung as a result of previous radiation therapy. No new suspicious findings on imaging.  Immune thrombocytopenic purpura, post-splenectomy, under observation Immune thrombocytopenic purpura (ITP) treated with splenectomy and high-dose steroids. Currently under observation with well-managed platelet counts. Continues on low-dose prednisone  every other day without adverse effects. - Continue current prednisone  regimen at 2.5 mg every other day The patient was advised to call immediately if she has any concerning symptoms in the interval. The patient voices understanding of current disease status and treatment options and is in agreement with the current care plan.  All questions were answered. The patient knows to call the clinic with any problems, questions or concerns. We can certainly see the patient much sooner if necessary.  The total time spent in the appointment was 20 minutes including review of chart and various tests results, discussions about plan of care and coordination of care plan .   Disclaimer: This note was dictated with voice recognition software. Similar sounding words can inadvertently be transcribed and may not be  corrected upon review.

## 2024-01-06 ENCOUNTER — Other Ambulatory Visit: Payer: Self-pay | Admitting: Internal Medicine

## 2024-01-21 ENCOUNTER — Encounter: Payer: Self-pay | Admitting: Internal Medicine

## 2024-01-21 ENCOUNTER — Ambulatory Visit: Admitting: Internal Medicine

## 2024-01-21 ENCOUNTER — Other Ambulatory Visit (INDEPENDENT_AMBULATORY_CARE_PROVIDER_SITE_OTHER)

## 2024-01-21 VITALS — BP 136/62 | HR 80 | Ht 61.0 in | Wt 150.4 lb

## 2024-01-21 DIAGNOSIS — K5900 Constipation, unspecified: Secondary | ICD-10-CM | POA: Diagnosis not present

## 2024-01-21 DIAGNOSIS — C3490 Malignant neoplasm of unspecified part of unspecified bronchus or lung: Secondary | ICD-10-CM

## 2024-01-21 DIAGNOSIS — K219 Gastro-esophageal reflux disease without esophagitis: Secondary | ICD-10-CM | POA: Diagnosis not present

## 2024-01-21 DIAGNOSIS — D500 Iron deficiency anemia secondary to blood loss (chronic): Secondary | ICD-10-CM | POA: Diagnosis not present

## 2024-01-21 DIAGNOSIS — R1032 Left lower quadrant pain: Secondary | ICD-10-CM

## 2024-01-21 DIAGNOSIS — K5909 Other constipation: Secondary | ICD-10-CM

## 2024-01-21 DIAGNOSIS — Z87898 Personal history of other specified conditions: Secondary | ICD-10-CM

## 2024-01-21 DIAGNOSIS — R059 Cough, unspecified: Secondary | ICD-10-CM

## 2024-01-21 DIAGNOSIS — R5383 Other fatigue: Secondary | ICD-10-CM

## 2024-01-21 LAB — CBC WITH DIFFERENTIAL/PLATELET
Basophils Absolute: 0 K/uL (ref 0.0–0.1)
Basophils Relative: 0.3 % (ref 0.0–3.0)
Eosinophils Absolute: 0.1 K/uL (ref 0.0–0.7)
Eosinophils Relative: 1 % (ref 0.0–5.0)
HCT: 40.9 % (ref 36.0–46.0)
Hemoglobin: 13.5 g/dL (ref 12.0–15.0)
Lymphocytes Relative: 24.4 % (ref 12.0–46.0)
Lymphs Abs: 3 K/uL (ref 0.7–4.0)
MCHC: 32.9 g/dL (ref 30.0–36.0)
MCV: 84.9 fl (ref 78.0–100.0)
Monocytes Absolute: 1.2 K/uL — ABNORMAL HIGH (ref 0.1–1.0)
Monocytes Relative: 9.6 % (ref 3.0–12.0)
Neutro Abs: 8 K/uL — ABNORMAL HIGH (ref 1.4–7.7)
Neutrophils Relative %: 64.7 % (ref 43.0–77.0)
Platelets: 177 K/uL (ref 150.0–400.0)
RBC: 4.82 Mil/uL (ref 3.87–5.11)
RDW: 14.4 % (ref 11.5–15.5)
WBC: 12.4 K/uL — ABNORMAL HIGH (ref 4.0–10.5)

## 2024-01-21 LAB — IBC + FERRITIN
Ferritin: 63 ng/mL (ref 10.0–291.0)
Iron: 85 ug/dL (ref 42–145)
Saturation Ratios: 23.8 % (ref 20.0–50.0)
TIBC: 357 ug/dL (ref 250.0–450.0)
Transferrin: 255 mg/dL (ref 212.0–360.0)

## 2024-01-21 NOTE — Patient Instructions (Signed)
 Take your pantoprazole  20 mg every morning and famotidine  40 mg at bedtime.   Take your smooth move tea 1/2 cup to 1 cup daily.   Discontinue Miralax .   If you do not see any improvement in your constipation with the smooth move tea then do a 1/2 Miralax  purge. See below:  Purchase a bottle of Miralax  over the counter as well as a box of 5 mg dulcolax tablets. Take 4 dulcolax tablets. Wait 1 hour. You will then drink 3-4 capfuls of Miralax  mixed in an adequate amount of water /juice/gatorade (you may choose which of these liquids to drink) over the next 2-3 hours. You should expect results within 1 to 6 hours after completing the bowel purge.  We will contact you to schedule a follow up visit when the December becomes available.   _______________________________________________________  If your blood pressure at your visit was 140/90 or greater, please contact your primary care physician to follow up on this.  _______________________________________________________  If you are age 46 or older, your body mass index should be between 23-30. Your Body mass index is 28.41 kg/m. If this is out of the aforementioned range listed, please consider follow up with your Primary Care Provider.  If you are age 7 or younger, your body mass index should be between 19-25. Your Body mass index is 28.41 kg/m. If this is out of the aformentioned range listed, please consider follow up with your Primary Care Provider.   ________________________________________________________  The [facility] providers would like to encourage you to use MYCHART to communicate with providers for non-urgent requests or questions.  Due to long hold times on the telephone, sending your provider a message by [facility] may be a faster and more efficient way to get a response.  Please allow 48 business hours for a response.  Please remember that this is for non-urgent requests.   _______________________________________________________  [facility] is using a team-based approach to care.  Your team is made up of your doctor and two to three APPS. Our APPS (Nurse Practitioners and Physician Assistants) work with your physician to ensure care continuity for you. They are fully qualified to address your health concerns and develop a treatment plan. They communicate directly with your gastroenterologist to care for you. Seeing the Advanced Practice Practitioners on your physician's team can help you by facilitating care more promptly, often allowing for earlier appointments, access to diagnostic testing, procedures, and other specialty referrals.

## 2024-01-21 NOTE — Progress Notes (Signed)
 Subjective:    Patient ID: Tamara Robertson, female    DOB: 07/05/1929, 89 y.o.   MRN: [id number]  HPI [name] is a 89 year old female with a history of GERD, IBS with constipation, prior GI bleed secondary to diverticulosis, complicated diverticular disease requiring resection in 2007, history of SBO secondary to abdominal adhesions, peripheral vascular disease, history of ITP, bronchogenic carcinoma of the right lung treated with radiation who is here for follow-up.  Last seen 09/25/2023.  Here alone today.  She experiences significant left lower abdominal pain associated with bowel movements, which is relieved after defecation. However, she often feels the urge to defecate without effective stool passage, sometimes straining for up to half an hour with minimal results. The pain is severe enough to occasionally require the use of a walker.  She uses Miralax , adjusting the frequency based on bowel movements, and Smooth Move tea, which provides relief but necessitates staying near a bathroom due to frequent bowel movements. No bleeding with bowel movements is noted. She recalls that her last colonoscopy on February 19, 2022, was reported to her as showing diverticulosis and a small rectal polyp that was removed. She has a history of diverticulitis.  She experiences symptoms of gastroesophageal reflux disease (GERD), including a persistent cough, particularly in the morning, and occasional heartburn and indigestion. She takes famotidine  40 mg at night, increased from 20 mg due to insufficient relief.  It appears the previously used pantoprazole  has fallen off.  The cough is most prominent in the morning and lessens throughout the day.  Her appetite is good, and she denies any nausea. She mentions that her stools often appear like 'coal' unless she uses Senna, which helps with bowel movements.   Review of Systems As per HPI, otherwise negative  Current Medications, Allergies, Past Medical History,  Past Surgical History, Family History and Social History were reviewed in [employer] record.    Objective:   Physical Exam BP 136/62 (BP Location: Left Arm, Patient Position: Sitting, Cuff Size: Normal)   Pulse 80   Ht 5' 1 (1.549 m)   Wt 150 lb 6 oz (68.2 kg)   BMI 28.41 kg/m  Gen: awake, alert, NAD HEENT: anicteric  Abd: soft, NT/ND, +BS throughout Ext: no c/c/e Neuro: nonfocal     Assessment & Plan:  89 year old female with a history of GERD, IBS with constipation, prior GI bleed secondary to diverticulosis, complicated diverticular disease requiring resection in 2007, history of SBO secondary to abdominal adhesions, peripheral vascular disease, history of ITP, bronchogenic carcinoma of the right lung treated with radiation who is here for follow-up.    Constipation with left lower abdominal pain/diverticulosis Chronic constipation with left lower abdominal pain likely due to incomplete evacuation and colon distention. Pain relieved by bowel movements.  Suspicion for active diverticulitis today is low.  Abdominal exam benign.  Previous colonoscopy showed diverticulosis and a small rectal polyp, removed. - Increase Senna tea to half a cup to a cup nightly. - Decrease Miralax  to assess Senna efficacy. - If Senna ineffective, perform half Miralax  purge with eight doses in thirty-two ounces of Gatorade on a free day.  Gastroesophageal reflux disease with cough and indigestion GERD with persistent cough and indigestion, especially in the morning. Current pantoprazole  provides partial relief.  - Resume pantoprazole  20 mg in the morning and continue famotidine  40 mg at night   Hx of IDA/fatigue - CBC and iron studies today  Lung cancer status post radiation -Following closely with  pulmonology  74-month follow-up  30 minutes total spent today including patient facing time, coordination of care, reviewing medical history/procedures/pertinent radiology studies,  and documentation of the encounter.

## 2024-01-27 ENCOUNTER — Ambulatory Visit: Payer: Self-pay | Admitting: Internal Medicine

## 2024-01-30 ENCOUNTER — Other Ambulatory Visit: Payer: Self-pay | Admitting: Internal Medicine

## 2024-01-30 DIAGNOSIS — Z1231 Encounter for screening mammogram for malignant neoplasm of breast: Secondary | ICD-10-CM

## 2024-02-03 NOTE — Progress Notes (Unsigned)
  Cardiology Office Note:   Date:  02/03/2024  ID:  Tamara Robertson, DOB [birth year], MRN [id number] PCP: Geofm Glade PARAS, MD  [facility] Providers Cardiologist:  Lynwood Schilling, MD {  History of Present Illness:   Tamara Robertson is a 89 y.o. female who presents for evaluation of PT.  She has had non obstructive CAD.    She had remote PTA in [place], and chronic LE edema.   She was in the hospital with new onset atrial fibrillation with rapid ventricular response.  He had demand ischemia with elevated troponins.  She was treated with IV diltiazem  and also antibiotics for possible pneumonia.  She converted to spontaneous sinus rhythm.  He had some sinus bradycardia with first-degree AV block and some 2.5-second pauses.  She was sent home on a low-dose beta-blocker and Eliquis .  Her echo demonstrated EF of 60 to 65% with normal wall motion.  She did have some dizziness.  She was to receive a 30-day event monitor.  Because of dizziness we did move her beta-blocker to the evening.    She wore a monitor and there was no evidence of atrial fib.   She came off of DOAC because of GI bleeding and rash on Eliquis .     Since I last saw her she has had no new cardiovascular complaints.  She is about to get some knee injections because she has bone-on-bone joint pain.  She occasionally has some sporadic short-lived shortness of breath and sharp mid chest pain.  However, she can walk up and down the [place] at the grocery store without any complaints.  She does not have any presyncope or syncope.  She has had no weight gain or edema.  She has not felt any palpitations.    ROS: ***  Studies Reviewed:    EKG:       ***  Risk Assessment/Calculations:   {Does this patient have ATRIAL FIBRILLATION?:[phone]} No BP recorded.  {Refresh Note OR Click here to enter BP  :1}***        Physical Exam:   VS:  There were no vitals taken for this visit.   Wt Readings from Last 3 Encounters:  01/21/24 150 lb 6 oz  (68.2 kg)  12/25/23 149 lb (67.6 kg)  12/10/23 148 lb (67.1 kg)     GEN: Well nourished, well developed in no acute distress NECK: No JVD; No carotid bruits CARDIAC: ***RR, *** murmurs, rubs, gallops RESPIRATORY:  Clear to auscultation without rales, wheezing or rhonchi  ABDOMEN: Soft, non-tender, non-distended EXTREMITIES:  No edema; No deformity   ASSESSMENT AND PLAN:   Paroxysmal Atrial Fibrillation: ***  She has not felt any palpitations.  She has not tolerated anticoagulation.  No change in therapy.    Mild to Moderate Aortic Regurgitation/Mild to Moderate Mitral Regurgitation:   ***  I will follow this clinically.  No echo at this time.    Hypertension:  Her blood pressure is ***  at target.  No change in therapy.     Follow up ***  Signed, Lynwood Schilling, MD

## 2024-02-05 ENCOUNTER — Ambulatory Visit: Attending: Cardiology | Admitting: Cardiology

## 2024-02-05 ENCOUNTER — Encounter: Payer: Self-pay | Admitting: Cardiology

## 2024-02-05 VITALS — BP 126/50 | HR 77 | Ht 61.0 in | Wt 148.0 lb

## 2024-02-05 DIAGNOSIS — I480 Paroxysmal atrial fibrillation: Secondary | ICD-10-CM | POA: Diagnosis not present

## 2024-02-05 DIAGNOSIS — I10 Essential (primary) hypertension: Secondary | ICD-10-CM

## 2024-02-05 DIAGNOSIS — I340 Nonrheumatic mitral (valve) insufficiency: Secondary | ICD-10-CM | POA: Diagnosis not present

## 2024-02-05 NOTE — Patient Instructions (Addendum)
 Medication Instructions:  Your physician recommends that you continue on your current medications as directed. Please refer to the Current Medication list given to you today.  *If you need a refill on your cardiac medications before your next appointment, please call your pharmacy*  Lab Work: TSH today at [facility] If you have labs (blood work) drawn today and your tests are completely normal, you will receive your results only by: MyChart Message (if you have MyChart) OR A paper copy in the mail If you have any lab test that is abnormal or we need to change your treatment, we will call you to review the results.  Testing/Procedures: NONE  Follow-Up: At [facility], you and your health needs are our priority.  As part of our continuing mission to provide you with exceptional heart care, our providers are all part of one team.  This team includes your primary Cardiologist (physician) and Advanced Practice Providers or APPs (Physician Assistants and Nurse Practitioners) who all work together to provide you with the care you need, when you need it.  Your next appointment:   6 month(s)  Provider:   Lynwood Schilling, MD    We recommend signing up for the patient portal called MyChart.  Sign up information is provided on this After Visit Summary.  MyChart is used to connect with patients for Virtual Visits (Telemedicine).  Patients are able to view lab/test results, encounter notes, upcoming appointments, etc.  Non-urgent messages can be sent to your provider as well.   To learn more about what you can do with MyChart, go to [email or web address].

## 2024-02-06 ENCOUNTER — Ambulatory Visit: Payer: Self-pay | Admitting: Cardiology

## 2024-02-06 LAB — TSH: TSH: 3.11 u[IU]/mL (ref 0.450–4.500)

## 2024-02-21 ENCOUNTER — Other Ambulatory Visit: Payer: Self-pay | Admitting: Cardiology

## 2024-03-05 ENCOUNTER — Other Ambulatory Visit: Payer: Self-pay | Admitting: Internal Medicine

## 2024-03-25 ENCOUNTER — Ambulatory Visit
Admission: RE | Admit: 2024-03-25 | Discharge: 2024-03-25 | Disposition: A | Discharge status: Home / Self Care | Source: Ambulatory Visit | Attending: Internal Medicine | Admitting: Internal Medicine

## 2024-03-25 DIAGNOSIS — Z1231 Encounter for screening mammogram for malignant neoplasm of breast: Secondary | ICD-10-CM

## 2024-04-06 NOTE — Progress Notes (Unsigned)
 89 y.o. [id number] postmenopausal female s/p hysterectomy, Grade 2-3 cystocele, left adnexal mass (stable in 2022) here for annual exam. Widowed. PCP: Geofm Glade PARAS, MD   She reports urinary frequency and urgency especially in the morning and overnight Feels prolapse is getting worse.  She was followed for a left ovarian cyst which showed stability and benign appearance at 4.2 cm on MRI 03/05/2020. Followed by GYN ONC until 2022.  At that time expectant management was recommended.  Urine sample provided: No  Postmenopausal bleeding: none Pelvic discharge or pain: none Breast mass, nipple discharge or skin changes : none Sexually active: No  Last PAP: No results found for: DIAGPAP, HPVHIGH, ADEQPAP Last mammogram: 03/25/24 Bir[place] 1, density B Last DXA: 05/25/22 osteopenia Last colonoscopy: 02/19/22   Exercising: Not much, some stretching Smoker:No  Flowsheet Row Office Visit from 04/07/2024 in [facility]  PHQ-2 Total Score 0    Flowsheet Row Clinical Support from 09/13/2023 in [facility]  PHQ-9 Total Score 4     GYN HISTORY: AS noted  OB History  Gravida Para Term Preterm AB Living  3 3 3   2   SAB IAB Ectopic Multiple Live Births      3    # Outcome Date GA Lbr Len/2nd Weight Sex Type Anes PTL Lv  3 Term      Vag-Spont   LIV  2 Term      Vag-Spont   LIV  1 Term      Vag-Spont   DEC    Obstetric Comments  1 child passed away at 45   Past Medical History:  Diagnosis Date   Achilles tendonitis    Acute lower GI bleeding 2023   Anxiety    Aortic atherosclerosis    CAD (coronary artery disease)    RCA 40% stenosis cath 01/2011   Diverticular stricture [facility]) 2006   [facility]   DIVERTICULITIS, HX OF    Diverticulosis    DYSLIPIDEMIA    Elevated LFTs    GERD    Glaucoma    Hepatic steatosis    HOH (hard of hearing)    Immune thrombocytopenic purpura (HCC)    chronic -  baseline 80-100K, on pred   Irritable bowel syndrome    Left ovarian cyst dx 01/2013 CT   working with gyn, ?malignant - elevated tumor marker OVA1   Lung cancer (HCC) 2023   OSTEOARTHRITIS, KNEE, RIGHT    OSTEOPENIA    OVERACTIVE BLADDER    Plantar fasciitis    Prolapse of female bladder, acquired 11/2017   Torn rotator cuff    right shoulder   URINARY INCONTINENCE    Past Surgical History:  Procedure Laterality Date   ABDOMINAL HYSTERECTOMY  1963   APPENDECTOMY  1956   BIOPSY  02/18/2022   Procedure: BIOPSY;  Surgeon: Wilhelmenia Aloha Raddle., MD;  Location: [facility];  Service: Gastroenterology;;   CARDIAC CATHETERIZATION     CATARACT EXTRACTION, BILATERAL  10/2010   CHOLECYSTECTOMY N/A 09/16/2014   Procedure: LAPAROSCOPIC CHOLECYSTECTOMY ;  Surgeon: Donnice Bury, MD;  Location: [facility] OR;  Service: General;  Laterality: N/A;   COLONOSCOPY N/A 02/19/2022   Procedure: COLONOSCOPY;  Surgeon: Legrand Victory LITTIE DOUGLAS, MD;  Location: [facility];  Service: Gastroenterology;  Laterality: N/A;   ESOPHAGOGASTRODUODENOSCOPY N/A 02/18/2022   Procedure: ESOPHAGOGASTRODUODENOSCOPY (EGD);  Surgeon: Wilhelmenia Aloha Raddle., MD;  Location: [facility];  Service: Gastroenterology;  Laterality: N/A;  FLEXIBLE SIGMOIDOSCOPY N/A 02/18/2022   Procedure: FLEXIBLE SIGMOIDOSCOPY;  Surgeon: Wilhelmenia Aloha Raddle., MD;  Location: [facility];  Service: Gastroenterology;  Laterality: N/A;   HEMOSTASIS CLIP PLACEMENT  02/19/2022   Procedure: HEMOSTASIS CLIP PLACEMENT;  Surgeon: Legrand Victory LITTIE DOUGLAS, MD;  Location: [facility];  Service: Gastroenterology;;   KNEE ARTHROSCOPY Right    L pop PTA  10/2009   stent   LAPAROSCOPIC SIGMOID COLECTOMY  10/2005   SPLENECTOMY  1954   VARICOSE VEIN SURGERY Right 1962   Current Outpatient Medications on File Prior to Visit  Medication Sig Dispense Refill   acetaminophen  (TYLENOL ) 500 MG tablet Take 500 mg by mouth every 6 (six) hours as needed for moderate pain.      ammonium lactate (AMLACTIN) 12 % lotion 1 application Externally Twice a day     Biotin 2500 MCG CAPS Take 1 capsule by mouth daily.      calcium  carbonate (TUMS - DOSED IN MG ELEMENTAL CALCIUM ) 500 MG chewable tablet Chew 1 tablet by mouth daily as needed for indigestion or heartburn.     cholecalciferol (VITAMIN D3) 25 MCG (1000 UNIT) tablet Take 1,000 Units by mouth daily.     clobetasol  ointment (TEMOVATE ) 0.05 % Apply 1 Application topically 2 (two) times daily. Apply topically to the affected area for up to two weeks 30 g 0   escitalopram  (LEXAPRO ) 5 MG tablet TAKE ONE TABLET BY MOUTH ONCE A DAY 90 tablet 0   famotidine  (PEPCID ) 40 MG tablet Take 1 tablet (40 mg total) by mouth daily. 30 tablet 11   Flaxseed MISC Take 5 mLs by mouth daily. Take 1 tsp daily     fluocinolone (SYNALAR) 0.01 % external solution Apply 1 Application topically 2 (two) times daily.     furosemide  (LASIX ) 20 MG tablet Take 1.5 tablets (30 mg total) by mouth daily. 135 tablet 3   lidocaine  (LMX) 4 % cream Apply 1 application  topically daily as needed (inflammation).     lidocaine  4 % Place 1 patch onto the skin daily.     Magnesium  Oxide 250 MG TABS Take 1 tablet by mouth.     metoprolol  succinate (TOPROL -XL) 25 MG 24 hr tablet TAKE 1/2 (ONE-HALF) TABLET BY MOUTH ONCE DAILY WITH OR IMMEDIATELY FOLLOWING A MEAL 45 tablet 2   Multiple Vitamin (MULTIVITAMIN) tablet Take 1 tablet by mouth daily.       pantoprazole  (PROTONIX ) 20 MG tablet Take 20 mg by mouth daily.     polyethylene glycol (MIRALAX  / GLYCOLAX ) packet Take 17 g by mouth daily as needed for moderate constipation.      predniSONE  (DELTASONE ) 2.5 MG tablet TAKE 1 TABLET BY MOUTH EVERY OTHER DAY. 45 tablet 3   Probiotic Product (PROBIOTIC DAILY PO) Take 1 tablet by mouth daily with breakfast.     PSYLLIUM PO Take 1 tablet by mouth daily as needed (constipation).     triamcinolone  cream (KENALOG ) 0.1 % APPLY 1 APPLICATION TOPICALLY 2 (TWO) TIMES DAILY AS  NEEDED (RASH). 30 g 0   trolamine salicylate (ASPERCREME) 10 % cream Apply 1 application topically as needed for muscle pain.     No current facility-administered medications on file prior to visit.   Social History   Socioeconomic History   Marital status: Widowed    Spouse name: Not on file   Number of children: 2   Years of education: Not on file   Highest education level: 12th grade  Occupational History   Occupation: Retired  Comment: front desk dental office  Tobacco Use   Smoking status: Never   Smokeless tobacco: Never  Vaping Use   Vaping status: Never Used  Substance and Sexual Activity   Alcohol use: No   Drug use: No   Sexual activity: Not Currently    Birth control/protection: Surgical    Comment: 1st intercourse- 19, partners- 2, hysterectomy  Other Topics Concern   Not on file  Social History Narrative   Retired futures trader.    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 to be close to kids      Lives alone/2025   Social Drivers of Health   Financial Resource Strain: Low Risk  (09/13/2023)   Overall Financial Resource Strain (CARDIA)    Difficulty of Paying Living Expenses: Not hard at all  Food Insecurity: No Food Insecurity (09/13/2023)   Hunger Vital Sign    Worried About Running Out of Food in the Last Year: Never true    Ran Out of Food in the Last Year: Never true  Transportation Needs: Unmet Transportation Needs (09/13/2023)   PRAPARE - Transportation    Lack of Transportation (Medical): Yes    Lack of Transportation (Non-Medical): Yes  Physical Activity: Insufficiently Active (09/13/2023)   Exercise Vital Sign    Days of Exercise per Week: 1 day    Minutes of Exercise per Session: 20 min  Stress: No Stress Concern Present (09/13/2023)   [employer] of Occupational Health - Occupational Stress Questionnaire    Feeling of Stress : Not at all  Social Connections: Moderately Integrated (09/13/2023)   Social Connection and Isolation Panel     Frequency of Communication with Friends and Family: More than three times a week    Frequency of Social Gatherings with Friends and Family: Once a week    Attends Religious Services: More than 4 times per year    Active Member of [employer] or Organizations: Yes    Attends Banker Meetings: More than 4 times per year    Marital Status: Widowed  Intimate Partner Violence: Patient Unable To Answer (09/13/2023)   Humiliation, Afraid, Rape, and Kick questionnaire    Fear of Current or Ex-Partner: Patient unable to answer    Emotionally Abused: Patient unable to answer    Physically Abused: Patient unable to answer    Sexually Abused: Patient unable to answer   Family History  Problem Relation Age of Onset   Coronary artery disease Mother    Heart attack Mother 20   Hyperlipidemia Mother    Hypertension Mother    Stomach cancer Father    Hypertension Daughter    Hyperlipidemia Daughter    Arthritis Other        parent   Transient ischemic attack Other        parent   BRCA 1/2 Neg Hx    Breast cancer Neg Hx    Allergies  Allergen Reactions   Aspirin Other (See Comments)    ITP   Sulfa  Antibiotics Other (See Comments)    dizziness   Contrast Media [Iodinated Contrast Media] Hives    CAT scan contrast only   Shellfish Protein-Containing Drug Products Hives    Other reaction(s): Other   Apixaban  Other (See Comments)    Rash   Cephalexin      Achiness all over, cold   Fluoxetine      Did not feel well on it    Sulfamethoxazole -Trimethoprim      Other reaction(s): Unknown   Celebrex [  Celecoxib] Other (See Comments)    Leg swelling   Celexa  [Citalopram ] Other (See Comments)    Felt out of it   Statins Other (See Comments)      PE Today's Vitals   04/07/24 1545  BP: 122/62  Pulse: 72  Temp: 98.1 F (36.7 C)  TempSrc: Oral  SpO2: 95%  Weight: 150 lb (68 kg)  Height: 5' 1.75 (1.568 m)   Body mass index is 27.66 kg/m.  Physical Exam Vitals reviewed. Exam  conducted with a chaperone present.  Constitutional:      General: She is not in acute distress.    Appearance: Normal appearance.  HENT:     Head: Normocephalic and atraumatic.     Nose: Nose normal.  Eyes:     Extraocular Movements: Extraocular movements intact.     Conjunctiva/sclera: Conjunctivae normal.  Pulmonary:     Effort: Pulmonary effort is normal.  Chest:     Chest wall: No mass or tenderness.  Breasts:    Right: Normal. No swelling, mass, nipple discharge or tenderness.     Left: Normal. No swelling, mass, nipple discharge or tenderness.  Abdominal:     General: There is no distension.     Palpations: Abdomen is soft.     Tenderness: There is no abdominal tenderness.  Genitourinary:    General: Normal vulva.     Exam position: Lithotomy position.     Urethra: No prolapse.     Vagina: Prolapsed vaginal walls (Grade 3) present. No vaginal discharge or bleeding.     Cervix: No lesion.     Adnexa: Right adnexa normal and left adnexa normal.     Comments: Cervix and uterus absent Kegel 2/5 Musculoskeletal:        General: Normal range of motion.     Cervical back: Normal range of motion.  Lymphadenopathy:     Upper Body:     Right upper body: No axillary adenopathy.     Left upper body: No axillary adenopathy.     Lower Body: No right inguinal adenopathy. No left inguinal adenopathy.  Skin:    General: Skin is warm and dry.     Comments: +seborrheic keratosis of trunk  Neurological:     General: No focal deficit present.     Mental Status: She is alert.  Psychiatric:        Mood and Affect: Mood normal.        Behavior: Behavior normal.       Assessment and Plan:        Encounter for breast and pelvic examination Assessment & Plan: Cervical cancer screening N/A: s/p hysterectomy Encouraged annual mammogram screening Colonoscopy UTD DXA UTD Labs and immunizations with her primary For patients over 70yo, I recommend 1200mg  calcium  daily and 800IU of  vitamin D  daily.   Negative depression screening  Urinary frequency Cystocele with prolapse Assessment & Plan: Discussed pessary, surgery, kegels Will RTO for pessary fitting Discussed OAB medications at that time   Vera LULLA Pa, MD

## 2024-04-07 ENCOUNTER — Encounter: Payer: Self-pay | Admitting: Obstetrics and Gynecology

## 2024-04-07 ENCOUNTER — Ambulatory Visit: Admitting: Obstetrics and Gynecology

## 2024-04-07 VITALS — BP 122/62 | HR 72 | Temp 98.1°F | Ht 61.75 in | Wt 150.0 lb

## 2024-04-07 DIAGNOSIS — N814 Uterovaginal prolapse, unspecified: Secondary | ICD-10-CM | POA: Diagnosis not present

## 2024-04-07 DIAGNOSIS — B079 Viral wart, unspecified: Secondary | ICD-10-CM

## 2024-04-07 DIAGNOSIS — Z9189 Other specified personal risk factors, not elsewhere classified: Secondary | ICD-10-CM | POA: Diagnosis not present

## 2024-04-07 DIAGNOSIS — R350 Frequency of micturition: Secondary | ICD-10-CM | POA: Diagnosis not present

## 2024-04-07 DIAGNOSIS — Z1331 Encounter for screening for depression: Secondary | ICD-10-CM

## 2024-04-07 DIAGNOSIS — Z01419 Encounter for gynecological examination (general) (routine) without abnormal findings: Secondary | ICD-10-CM | POA: Insufficient documentation

## 2024-04-07 NOTE — Assessment & Plan Note (Signed)
 Discussed pessary, surgery, kegels Will RTO for pessary fitting

## 2024-04-07 NOTE — Patient Instructions (Signed)
 For patients under 50-88yo, I recommend 1200mg  calcium  daily and 600IU of vitamin D daily. For patients over 88yo, I recommend 1200mg  calcium  daily and 800IU of vitamin D daily.  Health Maintenance, Female Adopting a healthy lifestyle and getting preventive care are important in promoting health and wellness. Ask your health care provider about: The right schedule for you to have regular tests and exams. Things you can do on your own to prevent diseases and keep yourself healthy. What should I know about diet, weight, and exercise? Eat a healthy diet  Eat a diet that includes plenty of vegetables, fruits, low-fat dairy products, and lean protein. Do not eat a lot of foods that are high in solid fats, added sugars, or sodium. Maintain a healthy weight Body mass index (BMI) is used to identify weight problems. It estimates body fat based on height and weight. Your health care provider can help determine your BMI and help you achieve or maintain a healthy weight. Get regular exercise Get regular exercise. This is one of the most important things you can do for your health. Most adults should: Exercise for at least 150 minutes each week. The exercise should increase your heart rate and make you sweat (moderate-intensity exercise). Do strengthening exercises at least twice a week. This is in addition to the moderate-intensity exercise. Spend less time sitting. Even light physical activity can be beneficial. Watch cholesterol and blood lipids Have your blood tested for lipids and cholesterol at 88 years of age, then have this test every 5 years. Have your cholesterol levels checked more often if: Your lipid or cholesterol levels are high. You are older than 88 years of age. You are at high risk for heart disease. What should I know about cancer screening? Depending on your health history and family history, you may need to have cancer screening at various ages. This may include screening  for: Breast cancer. Cervical cancer. Colorectal cancer. Skin cancer. Lung cancer. What should I know about heart disease, diabetes, and high blood pressure? Blood pressure and heart disease High blood pressure causes heart disease and increases the risk of stroke. This is more likely to develop in people who have high blood pressure readings or are overweight. Have your blood pressure checked: Every 3-5 years if you are 25-57 years of age. Every year if you are 24 years old or older. Diabetes Have regular diabetes screenings. This checks your fasting blood sugar level. Have the screening done: Once every three years after age 62 if you are at a normal weight and have a low risk for diabetes. More often and at a younger age if you are overweight or have a high risk for diabetes. What should I know about preventing infection? Hepatitis B If you have a higher risk for hepatitis B, you should be screened for this virus. Talk with your health care provider to find out if you are at risk for hepatitis B infection. Hepatitis C Testing is recommended for: Everyone born from [birth year] through 1965. Anyone with known risk factors for hepatitis C. Sexually transmitted infections (STIs) Get screened for STIs, including gonorrhea and chlamydia, if: You are sexually active and are younger than 88 years of age. You are older than 88 years of age and your health care provider tells you that you are at risk for this type of infection. Your sexual activity has changed since you were last screened, and you are at increased risk for chlamydia or gonorrhea. Ask your health care provider if  you are at risk. Ask your health care provider about whether you are at high risk for HIV. Your health care provider may recommend a prescription medicine to help prevent HIV infection. If you choose to take medicine to prevent HIV, you should first get tested for HIV. You should then be tested every 3 months for as long as you  are taking the medicine. Osteoporosis and menopause Osteoporosis is a disease in which the bones lose minerals and strength with aging. This can result in bone fractures. If you are 72 years old or older, or if you are at risk for osteoporosis and fractures, ask your health care provider if you should: Be screened for bone loss. Take a calcium  or vitamin D supplement to lower your risk of fractures. Be given hormone replacement therapy (HRT) to treat symptoms of menopause. Follow these instructions at home: Alcohol use Do not drink alcohol if: Your health care provider tells you not to drink. You are pregnant, may be pregnant, or are planning to become pregnant. If you drink alcohol: Limit how much you have to: 0-1 drink a day. Know how much alcohol is in your drink. In the [place] one drink equals one 12 oz bottle of beer (355 mL), one 5 oz glass of wine (148 mL), or one 1 oz glass of hard liquor (44 mL). Lifestyle Do not use any products that contain nicotine or tobacco. These products include cigarettes, chewing tobacco, and vaping devices, such as e-cigarettes. If you need help quitting, ask your health care provider. Do not use street drugs. Do not share needles. Ask your health care provider for help if you need support or information about quitting drugs. General instructions Schedule regular health, dental, and eye exams. Stay current with your vaccines. Tell your health care provider if: You often feel depressed. You have ever been abused or do not feel safe at home. Summary Adopting a healthy lifestyle and getting preventive care are important in promoting health and wellness. Follow your health care provider's instructions about healthy diet, exercising, and getting tested or screened for diseases. Follow your health care provider's instructions on monitoring your cholesterol and blood pressure. This information is not intended to replace advice given to you by your health  care provider. Make sure you discuss any questions you have with your health care provider. Document Revised: 09/12/2020 Document Reviewed: 09/12/2020 Elsevier Patient Education  2024 [employer].

## 2024-04-07 NOTE — Assessment & Plan Note (Signed)
 Cervical cancer screening N/A: s/p hysterectomy Encouraged annual mammogram screening Colonoscopy UTD DXA UTD Labs and immunizations with her primary For patients over 88yo, I recommend 1200mg  calcium  daily and 800IU of vitamin D  daily.

## 2024-04-14 ENCOUNTER — Telehealth: Payer: Self-pay | Admitting: Cardiology

## 2024-04-14 NOTE — Telephone Encounter (Signed)
*  STAT* If patient is at the pharmacy, call can be transferred to refill team.   1. Which medications need to be refilled? (please list name of each medication and dose if known)   furosemide  (LASIX ) 20 MG tablet    4. Which pharmacy/location (including street and city if local pharmacy) is medication to be sent to?  [id number] - [place],  - [address]     5. Do they need a 30 day or 90 day supply? 90    Pt states her pharmacy told her they do not have refills.

## 2024-04-14 NOTE — Telephone Encounter (Signed)
 Returned call to pt.  She has been made aware that there was a yearly supply of Furo[facility]ide  [facility] to [facility].  Pt verbalized understanding and will give them a call.

## 2024-04-15 ENCOUNTER — Ambulatory Visit: Admitting: Internal Medicine

## 2024-04-15 ENCOUNTER — Encounter: Payer: Self-pay | Admitting: Internal Medicine

## 2024-04-15 VITALS — BP 138/64 | HR 72 | Ht 61.75 in | Wt 149.2 lb

## 2024-04-15 DIAGNOSIS — K5731 Diverticulosis of large intestine without perforation or abscess with bleeding: Secondary | ICD-10-CM

## 2024-04-15 DIAGNOSIS — K219 Gastro-esophageal reflux disease without esophagitis: Secondary | ICD-10-CM | POA: Diagnosis not present

## 2024-04-15 DIAGNOSIS — R1032 Left lower quadrant pain: Secondary | ICD-10-CM | POA: Diagnosis not present

## 2024-04-15 DIAGNOSIS — K5909 Other constipation: Secondary | ICD-10-CM | POA: Diagnosis not present

## 2024-04-15 DIAGNOSIS — K5730 Diverticulosis of large intestine without perforation or abscess without bleeding: Secondary | ICD-10-CM | POA: Diagnosis not present

## 2024-04-15 DIAGNOSIS — M549 Dorsalgia, unspecified: Secondary | ICD-10-CM

## 2024-04-15 MED ORDER — PANTOPRAZOLE SODIUM 20 MG PO TBEC: 20.0000 mg | DELAYED_RELEASE_TABLET | Freq: Every day | ORAL | 5 refills | Status: AC

## 2024-04-15 NOTE — Progress Notes (Signed)
 Subjective:    Patient ID: Tamara Robertson, female    DOB: April 01, 1930, 89 y.o.   MRN: [id number]  HPI Tamara Robertson is a 89 year old female history of GERD, IBS with constipation, prior GI bleed secondary to diverticulosis, complicated diverticular disease requiring resection in 2007, history of SBO secondary to abdominal adhesions, peripheral vascular disease, history of ITP, bronchogenic carcinoma of the right lung treated with radiation who is here for follow-up.  Last seen 01/21/2024.  She experiences severe back pain that has recently worsened, making ambulation difficult.  This seemed to start just this morning.  The pain is localized to her left low back just above her pelvis and is a new development, as her knee was previously the primary issue. She wonders if she may have turned or pulled something to cause the pain. She has taken Aleve for the pain, but its effectiveness is unclear.  Regarding bowel movements, she has been taking a probiotic in the morning before coffee, which she believes may have contributed to recent diarrhea. She also consumed a 'hefty Italian soup,' which she thinks might be related. She uses Senna tea occasionally, not daily, and only when she feels she is not 'cleaning out.' She does not use Miralax  regularly but takes a tablespoon daily.  Estimates taking the senna tea maybe once or twice a week.  She discusses her reflux, noting that it is 'not too bad,' with occasional symptoms in the morning. She takes Pepcid  at night, 40 mg, and does not currently take Protonix . She recalls having a persistent cough and indigestion in the past, especially in the morning, but these symptoms have improved.  She experiences fatigue and low blood pressure, particularly in the morning. She describes an episode where her blood pressure was 101/xx and 105/xx, which she feels is too low. When this occurs, she needs to lie down and sometimes eats chocolate to feel better. She takes a fluid pill  in the morning, which she acknowledges might contribute to the low blood pressure. Her mornings are often disrupted by frequent bathroom trips and low energy.  Her sleep schedule is irregular, as she often stays up until 2 or 3 AM watching TV or using the computer, and sometimes falls asleep in her chair. She lives near [facility] and mentions that she often dozes off before the end of TV programs.  She recently saw GYN and will try a pessary for prolapse.  This follow-up is in January.  She will follow-up later this year with oncology and pulmonary, Dr. Shelah.  Review of Systems As per HPI, otherwise negative  Current Medications, Allergies, Past Medical History, Past Surgical History, Family History and Social History were reviewed in [employer] record.    Objective:   Physical Exam BP 138/64   Pulse 72   Ht 5' 1.75 (1.568 m)   Wt 149 lb 4 oz (67.7 kg)   BMI 27.52 kg/m  Gen: awake, alert, NAD HEENT: anicteric  Abd: soft, NT/ND, +BS throughout Ext: no c/c/e Neuro: nonfocal      Latest Ref Rng & Units 01/21/2024    2:27 PM 12/18/2023    1:34 PM 02/26/2023    2:30 PM  CBC  WBC 4.0 - 10.5 K/uL 12.4  10.8  9.5   Hemoglobin 12.0 - 15.0 g/dL 86.4  86.5  86.7   Hematocrit 36.0 - 46.0 % 40.9  41.0  39.6   Platelets 150.0 - 400.0 K/uL 177.0  172  168    Iron/TIBC/Ferritin/ %Sat    Component Value Date/Time   IRON 85 01/21/2024 1427   TIBC 357.0 01/21/2024 1427   FERRITIN 63.0 01/21/2024 1427   IRONPCTSAT 23.8 01/21/2024 1427       Assessment & Plan:     Constipation with left lower abdominal pain/diverticulosis Chronic constipation with left lower abdominal pain likely due to incomplete evacuation and colon distention. Pain relieved by bowel movements  Abdominal exam benign.  Previous colonoscopy showed diverticulosis and a small rectal polyp, removed.  Symptoms improved with MiraLAX  and as needed senna - Continue Senna tea to half a cup to a  cup on an as-needed basis - Continue 1 tablespoon of MiraLAX  daily - If Senna ineffective, perform half Miralax  purge with eight doses in thirty-two ounces of Gatorade on a free day.   Gastroesophageal reflux disease with cough and indigestion GERD with persistent cough and indigestion, especially in the morning.  She has been taking only famotidine  40 mg at night but not the pantoprazole  despite it being on the med list. - Resume pantoprazole  20 mg in the morning and continue famotidine  40 mg at night    Hx of IDA/fatigue - CBC and iron studies proved stable last visit   Lung cancer status post radiation -Following closely with pulmonology  58-month follow-up per patient request  30 minutes total spent today including patient facing time, coordination of care, reviewing medical history/procedures/pertinent radiology studies, and documentation of the encounter.

## 2024-04-15 NOTE — Patient Instructions (Signed)
 We have sent the following medications to your pharmacy for you to pick up at your convenience: pantoprazole .  Take Miralax  daily and famotidine  at bedtime.   Continue smooth move tea.   _______________________________________________________  If your blood pressure at your visit was 140/90 or greater, please contact your primary care physician to follow up on this.  _______________________________________________________  If you are age 88 or older, your body mass index should be between 23-30. Your Body mass index is 27.52 kg/m. If this is out of the aforementioned range listed, please consider follow up with your Primary Care Provider.  If you are age 60 or younger, your body mass index should be between 19-25. Your Body mass index is 27.52 kg/m. If this is out of the aformentioned range listed, please consider follow up with your Primary Care Provider.   ________________________________________________________  The [facility] providers would like to encourage you to use MYCHART to communicate with providers for non-urgent requests or questions.  Due to long hold times on the telephone, sending your provider a message by [facility] may be a faster and more efficient way to get a response.  Please allow 48 business hours for a response.  Please remember that this is for non-urgent requests.  _______________________________________________________  [facility] is using a team-based approach to care.  Your team is made up of your doctor and two to three APPS. Our APPS (Nurse Practitioners and Physician Assistants) work with your physician to ensure care continuity for you. They are fully qualified to address your health concerns and develop a treatment plan. They communicate directly with your gastroenterologist to care for you. Seeing the Advanced Practice Practitioners on your physician's team can help you by facilitating care more promptly, often allowing for earlier appointments,  access to diagnostic testing, procedures, and other specialty referrals.

## 2024-05-12 ENCOUNTER — Ambulatory Visit: Admitting: Obstetrics and Gynecology

## 2024-05-18 ENCOUNTER — Encounter: Payer: Self-pay | Admitting: Internal Medicine

## 2024-05-18 NOTE — Progress Notes (Unsigned)
 "     Subjective:    Patient ID: Tamara Robertson, female    DOB: 06-12-1929, 89 y.o.   MRN: [id number]     HPI Mehar is here for follow up of her chronic medical problems.  Discussed the use of AI scribe software for clinical note transcription with the patient, who gave verbal consent to proceed.  History of Present Illness Tamara Robertson is a 89 year old female who presents with persistent morning cough despite medication.  She experiences a persistent heavy cough primarily occurring in the morning upon waking. Despite taking 40 mg of Pepcid  at night and pantoprazole , the cough persists. Occasional nasal drainage is noted, and the cough can be triggered by environmental factors during the day. She has a history of stomach inflammation noted during an endoscopy a couple of years ago. No significant heartburn is reported.  She is due for a pessary fitting next week to address bladder issues, including frequent urination and incontinence.  She mentions episodes of forgetfulness, including not knowing what to do with objects in her hands and forgetting how to perform her morning hand exercises. These episodes have occurred two to three times, with the most recent episode happening about four days ago. She plans to document any future occurrences.  She is concerned about her memory.  She inquires about her daughter-in-law, who has brain cancer and is experiencing severe dementia, hair loss, and low platelet counts. Her daughter-in-law's condition has deteriorated significantly since Christmas, and she is now mostly sleeping and unresponsive.    Medications and allergies reviewed with patient and updated if appropriate.  Medications Ordered Prior to Encounter[1]   Review of Systems  Constitutional:  Negative for fever.  HENT:  Positive for postnasal drip. Negative for congestion and sore throat.   Respiratory:  Positive for cough (in the morning when she wakes up). Negative for shortness of  breath and wheezing.   Psychiatric/Behavioral:  The patient is nervous/anxious (Controlled).        Objective:   Vitals:   05/19/24 1423  BP: 138/60  Pulse: 70  Temp: 98.2 F (36.8 C)  SpO2: 98%   BP Readings from Last 3 Encounters:  05/19/24 138/60  04/15/24 138/64  04/07/24 122/62   Wt Readings from Last 3 Encounters:  05/19/24 150 lb (68 kg)  04/15/24 149 lb 4 oz (67.7 kg)  04/07/24 150 lb (68 kg)   Body mass index is 27.66 kg/m.    Physical Exam Constitutional:      General: She is not in acute distress.    Appearance: Normal appearance. She is not ill-appearing.  HENT:     Head: Normocephalic and atraumatic.  Cardiovascular:     Rate and Rhythm: Normal rate and regular rhythm.  Pulmonary:     Effort: Pulmonary effort is normal.     Breath sounds: Normal breath sounds.  Skin:    General: Skin is warm and dry.  Neurological:     Mental Status: She is alert. Mental status is at baseline.  Psychiatric:        Mood and Affect: Mood normal.        Behavior: Behavior normal.        Thought Content: Thought content normal.        Judgment: Judgment normal.        Lab Results  Component Value Date   WBC 12.4 (H) 01/21/2024   HGB 13.5 01/21/2024   HCT 40.9 01/21/2024   PLT 177.0 01/21/2024  GLUCOSE 135 (H) 12/18/2023   CHOL 175 07/25/2022   TRIG 236.0 (H) 07/25/2022   HDL 44.00 07/25/2022   LDLDIRECT 107.0 07/25/2022   LDLCALC 107 (H) 05/23/2017   ALT 15 12/18/2023   AST 25 12/18/2023   NA 140 12/18/2023   K 4.4 12/18/2023   CL 101 12/18/2023   CREATININE 0.89 12/18/2023   BUN 16 12/18/2023   CO2 25 12/18/2023   TSH 3.110 02/05/2024   INR 1.1 02/17/2022   HGBA1C 6.2 07/25/2022     Assessment & Plan:    See Problem List for Assessment and Plan of chronic medical problems.       [1]  Current Outpatient Medications on File Prior to Visit  Medication Sig Dispense Refill   acetaminophen  (TYLENOL ) 500 MG tablet Take 500 mg by mouth every  6 (six) hours as needed for moderate pain.     ammonium lactate (AMLACTIN) 12 % lotion 1 application Externally Twice a day     Biotin 2500 MCG CAPS Take 1 capsule by mouth daily.      calcium  carbonate (TUMS - DOSED IN MG ELEMENTAL CALCIUM ) 500 MG chewable tablet Chew 1 tablet by mouth daily as needed for indigestion or heartburn.     cholecalciferol (VITAMIN D3) 25 MCG (1000 UNIT) tablet Take 1,000 Units by mouth daily.     clobetasol  ointment (TEMOVATE ) 0.05 % Apply 1 Application topically 2 (two) times daily. Apply topically to the affected area for up to two weeks 30 g 0   escitalopram  (LEXAPRO ) 5 MG tablet TAKE ONE TABLET BY MOUTH ONCE A DAY 90 tablet 0   famotidine  (PEPCID ) 40 MG tablet Take 1 tablet (40 mg total) by mouth daily. 30 tablet 11   Flaxseed MISC Take 5 mLs by mouth daily. Take 1 tsp daily     fluocinolone (SYNALAR) 0.01 % external solution Apply 1 Application topically 2 (two) times daily.     furosemide  (LASIX ) 20 MG tablet Take 1.5 tablets (30 mg total) by mouth daily. 135 tablet 3   lidocaine  (LMX) 4 % cream Apply 1 application  topically daily as needed (inflammation).     lidocaine  4 % Place 1 patch onto the skin daily.     Magnesium  Oxide 250 MG TABS Take 1 tablet by mouth.     metoprolol  succinate (TOPROL -XL) 25 MG 24 hr tablet TAKE 1/2 (ONE-HALF) TABLET BY MOUTH ONCE DAILY WITH OR IMMEDIATELY FOLLOWING A MEAL 45 tablet 2   Multiple Vitamin (MULTIVITAMIN) tablet Take 1 tablet by mouth daily.       pantoprazole  (PROTONIX ) 20 MG tablet Take 1 tablet (20 mg total) by mouth daily. 30 tablet 5   polyethylene glycol (MIRALAX  / GLYCOLAX ) packet Take 17 g by mouth daily as needed for moderate constipation.      predniSONE  (DELTASONE ) 2.5 MG tablet TAKE 1 TABLET BY MOUTH EVERY OTHER DAY. 45 tablet 3   Probiotic Product (PROBIOTIC DAILY PO) Take 1 tablet by mouth daily with breakfast.     PSYLLIUM PO Take 1 tablet by mouth daily as needed (constipation).     senna (SENOKOT) 8.6  MG TABS tablet Take 1 tablet by mouth daily as needed for mild constipation.     triamcinolone  cream (KENALOG ) 0.1 % APPLY 1 APPLICATION TOPICALLY 2 (TWO) TIMES DAILY AS NEEDED (RASH). 30 g 0   trolamine salicylate (ASPERCREME) 10 % cream Apply 1 application topically as needed for muscle pain.     No current facility-administered medications on file prior  to visit.   "

## 2024-05-19 ENCOUNTER — Ambulatory Visit: Admitting: Internal Medicine

## 2024-05-19 VITALS — BP 138/60 | HR 70 | Temp 98.2°F | Ht 61.75 in | Wt 150.0 lb

## 2024-05-19 DIAGNOSIS — L03011 Cellulitis of right finger: Secondary | ICD-10-CM

## 2024-05-19 DIAGNOSIS — F419 Anxiety disorder, unspecified: Secondary | ICD-10-CM

## 2024-05-19 DIAGNOSIS — F32A Depression, unspecified: Secondary | ICD-10-CM | POA: Diagnosis not present

## 2024-05-19 DIAGNOSIS — D693 Immune thrombocytopenic purpura: Secondary | ICD-10-CM | POA: Diagnosis not present

## 2024-05-19 MED ORDER — CLOBETASOL PROPIONATE 0.05 % EX OINT: 1.0000 | TOPICAL_OINTMENT | Freq: Two times a day (BID) | CUTANEOUS | 0 refills | Status: AC

## 2024-05-19 MED ORDER — AMOXICILLIN-POT CLAVULANATE 875-125 MG PO TABS: 1.0000 | ORAL_TABLET | Freq: Two times a day (BID) | ORAL | 0 refills | Status: AC

## 2024-05-19 NOTE — Assessment & Plan Note (Signed)
Chronic Stable Following with heme-onc Continue prednisone 2.5 mg every other day

## 2024-05-19 NOTE — Assessment & Plan Note (Signed)
 Chronic, recurrent Has seen dermatology Continue use of clobetasol  twice daily and nail bed If this flares up start Augmentin  875-125 mg twice daily x 5 days-prescription given so that she can have this at home so she can start it immediately

## 2024-05-19 NOTE — Patient Instructions (Addendum)
" ° ° ° ° °  Medications changes include :   clobetasol  cream, augmentin     "

## 2024-05-19 NOTE — Assessment & Plan Note (Signed)
Chronic Overall well-controlled Continue Lexapro 5 mg daily

## 2024-05-27 ENCOUNTER — Other Ambulatory Visit: Payer: Self-pay | Admitting: Cardiology

## 2024-05-27 ENCOUNTER — Ambulatory Visit: Admitting: Obstetrics and Gynecology

## 2024-05-27 DIAGNOSIS — I10 Essential (primary) hypertension: Secondary | ICD-10-CM

## 2024-06-16 ENCOUNTER — Ambulatory Visit (HOSPITAL_BASED_OUTPATIENT_CLINIC_OR_DEPARTMENT_OTHER)

## 2024-06-16 ENCOUNTER — Other Ambulatory Visit

## 2024-06-23 ENCOUNTER — Ambulatory Visit: Admitting: Internal Medicine

## 2024-07-28 ENCOUNTER — Ambulatory Visit: Admitting: Internal Medicine

## 2024-09-03 ENCOUNTER — Ambulatory Visit: Admitting: Emergency Medicine

## 2024-09-15 ENCOUNTER — Ambulatory Visit
